# Patient Record
Sex: Female | Born: 1950
Health system: Southern US, Community
[De-identification: ages and names within clinical notes are randomized; demographics above are authoritative.]

## PROBLEM LIST (undated history)

## (undated) DIAGNOSIS — G709 Myoneural disorder, unspecified: Secondary | ICD-10-CM

## (undated) DIAGNOSIS — Z720 Tobacco use: Secondary | ICD-10-CM

## (undated) DIAGNOSIS — F411 Generalized anxiety disorder: Secondary | ICD-10-CM

## (undated) DIAGNOSIS — I350 Nonrheumatic aortic (valve) stenosis: Secondary | ICD-10-CM

## (undated) DIAGNOSIS — K449 Diaphragmatic hernia without obstruction or gangrene: Secondary | ICD-10-CM

## (undated) DIAGNOSIS — E785 Hyperlipidemia, unspecified: Secondary | ICD-10-CM

## (undated) DIAGNOSIS — F419 Anxiety disorder, unspecified: Secondary | ICD-10-CM

## (undated) DIAGNOSIS — Z7982 Long term (current) use of aspirin: Secondary | ICD-10-CM

## (undated) DIAGNOSIS — E079 Disorder of thyroid, unspecified: Secondary | ICD-10-CM

## (undated) DIAGNOSIS — E119 Type 2 diabetes mellitus without complications: Secondary | ICD-10-CM

## (undated) DIAGNOSIS — K219 Gastro-esophageal reflux disease without esophagitis: Secondary | ICD-10-CM

## (undated) DIAGNOSIS — R002 Palpitations: Secondary | ICD-10-CM

## (undated) DIAGNOSIS — F32A Depression, unspecified: Secondary | ICD-10-CM

## (undated) DIAGNOSIS — I5189 Other ill-defined heart diseases: Secondary | ICD-10-CM

## (undated) DIAGNOSIS — I10 Essential (primary) hypertension: Secondary | ICD-10-CM

## (undated) DIAGNOSIS — I700 Atherosclerosis of aorta: Secondary | ICD-10-CM

## (undated) DIAGNOSIS — M810 Age-related osteoporosis without current pathological fracture: Secondary | ICD-10-CM

## (undated) DIAGNOSIS — T148XXA Other injury of unspecified body region, initial encounter: Secondary | ICD-10-CM

## (undated) DIAGNOSIS — G4730 Sleep apnea, unspecified: Secondary | ICD-10-CM

## (undated) DIAGNOSIS — M170 Bilateral primary osteoarthritis of knee: Secondary | ICD-10-CM

## (undated) DIAGNOSIS — G4700 Insomnia, unspecified: Secondary | ICD-10-CM

## (undated) DIAGNOSIS — Z9981 Dependence on supplemental oxygen: Secondary | ICD-10-CM

## (undated) DIAGNOSIS — J449 Chronic obstructive pulmonary disease, unspecified: Secondary | ICD-10-CM

## (undated) DIAGNOSIS — N182 Chronic kidney disease, stage 2 (mild): Secondary | ICD-10-CM

## (undated) DIAGNOSIS — F329 Major depressive disorder, single episode, unspecified: Secondary | ICD-10-CM

## (undated) DIAGNOSIS — Z91030 Bee allergy status: Secondary | ICD-10-CM

## (undated) DIAGNOSIS — I4510 Unspecified right bundle-branch block: Secondary | ICD-10-CM

## (undated) DIAGNOSIS — R011 Cardiac murmur, unspecified: Secondary | ICD-10-CM

## (undated) DIAGNOSIS — G3184 Mild cognitive impairment, so stated: Secondary | ICD-10-CM

## (undated) HISTORY — DX: Depression, unspecified: F32.A

## (undated) HISTORY — PX: TONSILLECTOMY: SUR1361

## (undated) HISTORY — DX: Diaphragmatic hernia without obstruction or gangrene: K44.9

## (undated) HISTORY — DX: Disorder of thyroid, unspecified: E07.9

## (undated) HISTORY — DX: Bilateral primary osteoarthritis of knee: M17.0

## (undated) HISTORY — PX: BACK SURGERY: SHX140

## (undated) HISTORY — DX: Anxiety disorder, unspecified: F41.9

## (undated) HISTORY — DX: Generalized anxiety disorder: F41.1

## (undated) HISTORY — PX: ESOPHAGEAL DILATION: SHX303

## (undated) HISTORY — PX: COLONOSCOPY: SHX174

## (undated) HISTORY — DX: Tobacco use: Z72.0

## (undated) HISTORY — DX: Type 2 diabetes mellitus without complications: E11.9

## (undated) HISTORY — PX: SALIVARY GLAND SURGERY: SHX768

## (undated) HISTORY — DX: Age-related osteoporosis without current pathological fracture: M81.0

## (undated) HISTORY — DX: Bee allergy status: Z91.030

## (undated) HISTORY — DX: Hyperlipidemia, unspecified: E78.5

## (undated) HISTORY — DX: Gastro-esophageal reflux disease without esophagitis: K21.9

## (undated) HISTORY — DX: Major depressive disorder, single episode, unspecified: F32.9

## (undated) HISTORY — DX: Palpitations: R00.2

## (undated) HISTORY — PX: OTHER SURGICAL HISTORY: SHX169

## (undated) HISTORY — PX: OOPHORECTOMY: SHX86

## (undated) HISTORY — PX: CHOLECYSTECTOMY: SHX55

## (undated) HISTORY — DX: Cardiac murmur, unspecified: R01.1

## (undated) HISTORY — DX: Nonrheumatic aortic (valve) stenosis: I35.0

## (undated) HISTORY — DX: Essential (primary) hypertension: I10

## (undated) HISTORY — DX: Chronic obstructive pulmonary disease, unspecified: J44.9

## (undated) HISTORY — PX: APPENDECTOMY: SHX54

## (undated) HISTORY — PX: DILATION AND CURETTAGE OF UTERUS: SHX78

---

## 1970-12-07 HISTORY — PX: OVARIAN CYST REMOVAL: SHX89

## 1991-12-08 HISTORY — PX: ABDOMINAL HYSTERECTOMY: SHX81

## 1995-12-08 DIAGNOSIS — K219 Gastro-esophageal reflux disease without esophagitis: Secondary | ICD-10-CM

## 1995-12-08 HISTORY — DX: Diaphragmatic hernia without obstruction or gangrene: K21.9

## 1999-07-02 ENCOUNTER — Encounter: Payer: Self-pay | Admitting: Family Medicine

## 1999-07-02 LAB — CONVERTED CEMR LAB: Blood Glucose, Fasting: 118 mg/dL

## 1999-07-07 ENCOUNTER — Encounter: Payer: Self-pay | Admitting: Family Medicine

## 1999-07-07 LAB — CONVERTED CEMR LAB: Blood Glucose, Fasting: 118 mg/dL

## 1999-07-08 ENCOUNTER — Encounter: Payer: Self-pay | Admitting: Family Medicine

## 1999-07-08 LAB — CONVERTED CEMR LAB
Hgb A1c MFr Bld: 5.1 %
RBC count: 4.01 10*6/uL
TSH: 0.75 microintl units/mL
WBC, blood: 8.8 10*3/uL

## 2001-01-12 ENCOUNTER — Encounter: Payer: Self-pay | Admitting: Family Medicine

## 2001-01-12 LAB — CONVERTED CEMR LAB
Blood Glucose, Fasting: 147 mg/dL
TSH: 1.34 microintl units/mL

## 2001-01-21 ENCOUNTER — Encounter: Payer: Self-pay | Admitting: Family Medicine

## 2001-01-21 LAB — CONVERTED CEMR LAB
Blood Glucose, Fasting: 118 mg/dL
Hgb A1c MFr Bld: 6.3 %

## 2001-02-09 LAB — FECAL OCCULT BLOOD, GUAIAC: Fecal Occult Blood: NEGATIVE

## 2001-07-26 ENCOUNTER — Encounter: Payer: Self-pay | Admitting: Family Medicine

## 2001-07-26 LAB — CONVERTED CEMR LAB: Hgb A1c MFr Bld: 5.6 %

## 2004-07-23 ENCOUNTER — Encounter: Payer: Self-pay | Admitting: Family Medicine

## 2004-07-23 LAB — CONVERTED CEMR LAB
Blood Glucose, Fasting: 115 mg/dL
Hgb A1c MFr Bld: 5.8 %

## 2005-04-22 ENCOUNTER — Ambulatory Visit: Payer: Self-pay | Admitting: Family Medicine

## 2005-12-07 HISTORY — PX: BREAST BIOPSY: SHX20

## 2006-06-15 ENCOUNTER — Ambulatory Visit: Payer: Self-pay | Admitting: Family Medicine

## 2006-11-23 ENCOUNTER — Ambulatory Visit: Payer: Self-pay | Admitting: Family Medicine

## 2006-11-23 LAB — CONVERTED CEMR LAB
Blood Glucose, Fasting: 136 mg/dL
Hgb A1c MFr Bld: 6 %

## 2006-11-24 ENCOUNTER — Other Ambulatory Visit: Payer: Self-pay

## 2006-11-24 ENCOUNTER — Emergency Department: Payer: Self-pay | Admitting: Emergency Medicine

## 2006-11-25 ENCOUNTER — Ambulatory Visit: Payer: Self-pay | Admitting: General Practice

## 2007-08-10 ENCOUNTER — Ambulatory Visit: Payer: Self-pay | Admitting: Family Medicine

## 2007-08-10 DIAGNOSIS — E119 Type 2 diabetes mellitus without complications: Secondary | ICD-10-CM | POA: Insufficient documentation

## 2007-08-10 DIAGNOSIS — E1143 Type 2 diabetes mellitus with diabetic autonomic (poly)neuropathy: Secondary | ICD-10-CM

## 2007-08-10 DIAGNOSIS — Z87891 Personal history of nicotine dependence: Secondary | ICD-10-CM | POA: Insufficient documentation

## 2007-08-10 HISTORY — DX: Type 2 diabetes mellitus with diabetic autonomic (poly)neuropathy: E11.43

## 2007-12-06 ENCOUNTER — Encounter: Payer: Self-pay | Admitting: Family Medicine

## 2007-12-06 ENCOUNTER — Ambulatory Visit: Payer: Self-pay | Admitting: Family Medicine

## 2007-12-06 LAB — CONVERTED CEMR LAB
ALT: 14 units/L (ref 0–35)
AST: 17 units/L (ref 0–37)
BUN: 4 mg/dL — ABNORMAL LOW (ref 6–23)
Basophils Absolute: 0 10*3/uL (ref 0.0–0.1)
Basophils Relative: 0.5 % (ref 0.0–1.0)
CO2: 31 meq/L (ref 19–32)
Calcium: 9.1 mg/dL (ref 8.4–10.5)
Chloride: 106 meq/L (ref 96–112)
Cholesterol: 303 mg/dL (ref 0–200)
Creatinine, Ser: 0.7 mg/dL (ref 0.4–1.2)
Creatinine,U: 81 mg/dL
Direct LDL: 235.5 mg/dL
Eosinophils Absolute: 0.3 10*3/uL (ref 0.0–0.6)
Eosinophils Relative: 4.4 % (ref 0.0–5.0)
GFR calc Af Amer: 111 mL/min
GFR calc non Af Amer: 92 mL/min
Glucose, Bld: 113 mg/dL — ABNORMAL HIGH (ref 70–99)
HCT: 37.9 % (ref 36.0–46.0)
HDL: 43.1 mg/dL (ref >39.0)
Hemoglobin: 12.9 g/dL (ref 12.0–15.0)
Hgb A1c MFr Bld: 5.8 % (ref 4.6–6.0)
Lymphocytes Relative: 28.2 % (ref 12.0–46.0)
MCHC: 34.1 g/dL (ref 30.0–36.0)
MCV: 102 fL — ABNORMAL HIGH (ref 78.0–100.0)
Microalb Creat Ratio: 6.2 mg/g (ref 0.0–30.0)
Microalb, Ur: 0.5 mg/dL (ref 0.0–1.9)
Monocytes Absolute: 0.4 10*3/uL (ref 0.2–0.7)
Monocytes Relative: 6.5 % (ref 3.0–11.0)
Neutro Abs: 3.8 10*3/uL (ref 1.4–7.7)
Neutrophils Relative %: 60.4 % (ref 43.0–77.0)
Platelets: 278 10*3/uL (ref 150–400)
Potassium: 3.9 meq/L (ref 3.5–5.1)
RBC: 3.72 M/uL — ABNORMAL LOW (ref 3.87–5.11)
RDW: 11.2 % — ABNORMAL LOW (ref 11.5–14.6)
Sodium: 141 meq/L (ref 135–145)
TSH: 1.04 microintl units/mL (ref 0.35–5.50)
Total CHOL/HDL Ratio: 7
Triglycerides: 88 mg/dL (ref 0–149)
VLDL: 18 mg/dL (ref 0–40)
WBC: 6.3 10*3/uL (ref 4.5–10.5)

## 2007-12-07 DIAGNOSIS — F3341 Major depressive disorder, recurrent, in partial remission: Secondary | ICD-10-CM | POA: Insufficient documentation

## 2007-12-07 DIAGNOSIS — E7800 Pure hypercholesterolemia, unspecified: Secondary | ICD-10-CM | POA: Insufficient documentation

## 2007-12-13 ENCOUNTER — Encounter: Payer: Self-pay | Admitting: Family Medicine

## 2007-12-13 ENCOUNTER — Other Ambulatory Visit: Admission: RE | Admit: 2007-12-13 | Discharge: 2007-12-13 | Payer: Self-pay | Admitting: Family Medicine

## 2007-12-13 ENCOUNTER — Ambulatory Visit: Payer: Self-pay | Admitting: Family Medicine

## 2007-12-13 LAB — CONVERTED CEMR LAB: Pap Smear: NORMAL

## 2007-12-14 ENCOUNTER — Telehealth: Payer: Self-pay | Admitting: Family Medicine

## 2007-12-16 ENCOUNTER — Encounter (INDEPENDENT_AMBULATORY_CARE_PROVIDER_SITE_OTHER): Payer: Self-pay | Admitting: *Deleted

## 2008-03-13 ENCOUNTER — Encounter (INDEPENDENT_AMBULATORY_CARE_PROVIDER_SITE_OTHER): Payer: Self-pay | Admitting: *Deleted

## 2008-08-28 ENCOUNTER — Telehealth: Payer: Self-pay | Admitting: Family Medicine

## 2008-09-03 ENCOUNTER — Telehealth: Payer: Self-pay | Admitting: Family Medicine

## 2008-11-06 HISTORY — PX: WRIST FRACTURE SURGERY: SHX121

## 2009-02-25 ENCOUNTER — Telehealth: Payer: Self-pay | Admitting: Family Medicine

## 2009-05-01 ENCOUNTER — Telehealth: Payer: Self-pay | Admitting: Family Medicine

## 2009-05-03 ENCOUNTER — Telehealth: Payer: Self-pay | Admitting: Family Medicine

## 2009-05-27 ENCOUNTER — Ambulatory Visit: Payer: Self-pay | Admitting: Family Medicine

## 2009-05-28 ENCOUNTER — Ambulatory Visit: Payer: Self-pay | Admitting: Family Medicine

## 2009-05-29 DIAGNOSIS — E559 Vitamin D deficiency, unspecified: Secondary | ICD-10-CM | POA: Insufficient documentation

## 2009-05-29 LAB — CONVERTED CEMR LAB: Vit D, 25-Hydroxy: 21 ng/mL — ABNORMAL LOW (ref 30–89)

## 2009-06-07 LAB — CONVERTED CEMR LAB
ALT: 13 units/L (ref 0–35)
AST: 18 units/L (ref 0–37)
Albumin: 3.9 g/dL (ref 3.5–5.2)
Alkaline Phosphatase: 35 units/L — ABNORMAL LOW (ref 39–117)
BUN: 12 mg/dL (ref 6–23)
Basophils Absolute: 0 10*3/uL (ref 0.0–0.1)
Basophils Relative: 0.1 % (ref 0.0–3.0)
Bilirubin, Direct: 0 mg/dL (ref 0.0–0.3)
CO2: 28 meq/L (ref 19–32)
Calcium: 9.1 mg/dL (ref 8.4–10.5)
Chloride: 105 meq/L (ref 96–112)
Cholesterol: 303 mg/dL — ABNORMAL HIGH (ref 0–200)
Creatinine, Ser: 0.8 mg/dL (ref 0.4–1.2)
Creatinine,U: 109.9 mg/dL
Direct LDL: 209.2 mg/dL
Eosinophils Absolute: 0.2 10*3/uL (ref 0.0–0.7)
Eosinophils Relative: 3.2 % (ref 0.0–5.0)
GFR calc non Af Amer: 78.02 mL/min (ref >60)
Glucose, Bld: 114 mg/dL — ABNORMAL HIGH (ref 70–99)
HCT: 38 % (ref 36.0–46.0)
HDL: 36.1 mg/dL — ABNORMAL LOW (ref >39.00)
Hemoglobin: 13.6 g/dL (ref 12.0–15.0)
Hgb A1c MFr Bld: 5.9 % (ref 4.6–6.5)
Lymphocytes Relative: 30.4 % (ref 12.0–46.0)
Lymphs Abs: 1.9 10*3/uL (ref 0.7–4.0)
MCHC: 35.7 g/dL (ref 30.0–36.0)
MCV: 98.8 fL (ref 78.0–100.0)
Microalb Creat Ratio: 12.7 mg/g (ref 0.0–30.0)
Microalb, Ur: 1.4 mg/dL (ref 0.0–1.9)
Monocytes Absolute: 0.5 10*3/uL (ref 0.1–1.0)
Monocytes Relative: 7.8 % (ref 3.0–12.0)
Neutro Abs: 3.8 10*3/uL (ref 1.4–7.7)
Neutrophils Relative %: 58.5 % (ref 43.0–77.0)
Platelets: 240 10*3/uL (ref 150.0–400.0)
Potassium: 4.3 meq/L (ref 3.5–5.1)
RBC: 3.85 M/uL — ABNORMAL LOW (ref 3.87–5.11)
RDW: 11.1 % — ABNORMAL LOW (ref 11.5–14.6)
Sodium: 142 meq/L (ref 135–145)
TSH: 1.69 microintl units/mL (ref 0.35–5.50)
Total Bilirubin: 0.9 mg/dL (ref 0.3–1.2)
Total CHOL/HDL Ratio: 8
Total Protein: 6.7 g/dL (ref 6.0–8.3)
Triglycerides: 262 mg/dL — ABNORMAL HIGH (ref 0.0–149.0)
VLDL: 52.4 mg/dL — ABNORMAL HIGH (ref 0.0–40.0)
WBC: 6.4 10*3/uL (ref 4.5–10.5)

## 2009-09-02 ENCOUNTER — Telehealth: Payer: Self-pay | Admitting: Family Medicine

## 2010-01-28 ENCOUNTER — Telehealth: Payer: Self-pay | Admitting: Family Medicine

## 2010-02-19 ENCOUNTER — Telehealth: Payer: Self-pay | Admitting: Family Medicine

## 2010-03-03 ENCOUNTER — Telehealth: Payer: Self-pay | Admitting: Family Medicine

## 2010-04-03 ENCOUNTER — Ambulatory Visit: Payer: Self-pay | Admitting: Family Medicine

## 2010-04-03 LAB — CONVERTED CEMR LAB
ALT: 20 units/L (ref 0–35)
AST: 19 units/L (ref 0–37)
Cholesterol: 172 mg/dL (ref 0–200)
HDL: 41.5 mg/dL (ref >39.00)
LDL Cholesterol: 110 mg/dL — ABNORMAL HIGH (ref 0–99)
Total CHOL/HDL Ratio: 4
Triglycerides: 104 mg/dL (ref 0.0–149.0)
VLDL: 20.8 mg/dL (ref 0.0–40.0)

## 2010-05-06 ENCOUNTER — Telehealth (INDEPENDENT_AMBULATORY_CARE_PROVIDER_SITE_OTHER): Payer: Self-pay | Admitting: *Deleted

## 2010-05-09 ENCOUNTER — Telehealth (INDEPENDENT_AMBULATORY_CARE_PROVIDER_SITE_OTHER): Payer: Self-pay | Admitting: *Deleted

## 2010-05-27 ENCOUNTER — Ambulatory Visit: Payer: Self-pay | Admitting: Family Medicine

## 2010-07-15 ENCOUNTER — Encounter (INDEPENDENT_AMBULATORY_CARE_PROVIDER_SITE_OTHER): Payer: Self-pay | Admitting: *Deleted

## 2010-08-18 ENCOUNTER — Telehealth (INDEPENDENT_AMBULATORY_CARE_PROVIDER_SITE_OTHER): Payer: Self-pay | Admitting: *Deleted

## 2010-09-08 ENCOUNTER — Telehealth: Payer: Self-pay | Admitting: Family Medicine

## 2010-09-22 ENCOUNTER — Ambulatory Visit: Payer: Self-pay | Admitting: Family Medicine

## 2010-12-03 ENCOUNTER — Telehealth (INDEPENDENT_AMBULATORY_CARE_PROVIDER_SITE_OTHER): Payer: Self-pay | Admitting: *Deleted

## 2010-12-09 ENCOUNTER — Telehealth: Payer: Self-pay | Admitting: Family Medicine

## 2010-12-12 ENCOUNTER — Other Ambulatory Visit: Payer: Self-pay | Admitting: Family Medicine

## 2010-12-12 ENCOUNTER — Ambulatory Visit
Admission: RE | Admit: 2010-12-12 | Discharge: 2010-12-12 | Payer: Self-pay | Source: Home / Self Care | Attending: Family Medicine | Admitting: Family Medicine

## 2010-12-12 ENCOUNTER — Telehealth (INDEPENDENT_AMBULATORY_CARE_PROVIDER_SITE_OTHER): Payer: Self-pay | Admitting: *Deleted

## 2010-12-12 ENCOUNTER — Encounter: Payer: Self-pay | Admitting: Family Medicine

## 2010-12-12 LAB — BASIC METABOLIC PANEL
BUN: 8 mg/dL (ref 6–23)
CO2: 29 mEq/L (ref 19–32)
Calcium: 9.5 mg/dL (ref 8.4–10.5)
Chloride: 104 mEq/L (ref 96–112)
Creatinine, Ser: 0.8 mg/dL (ref 0.4–1.2)
GFR: 82.35 mL/min (ref >60.00)
Glucose, Bld: 117 mg/dL — ABNORMAL HIGH (ref 70–99)
Potassium: 4.5 mEq/L (ref 3.5–5.1)
Sodium: 140 mEq/L (ref 135–145)

## 2010-12-12 LAB — LIPID PANEL
Cholesterol: 186 mg/dL (ref 0–200)
HDL: 44.5 mg/dL (ref >39.00)
LDL Cholesterol: 123 mg/dL — ABNORMAL HIGH (ref 0–99)
Total CHOL/HDL Ratio: 4
Triglycerides: 92 mg/dL (ref 0.0–149.0)
VLDL: 18.4 mg/dL (ref 0.0–40.0)

## 2010-12-12 LAB — HEPATIC FUNCTION PANEL
ALT: 16 U/L (ref 0–35)
AST: 21 U/L (ref 0–37)
Albumin: 3.8 g/dL (ref 3.5–5.2)
Alkaline Phosphatase: 43 U/L (ref 39–117)
Bilirubin, Direct: 0.1 mg/dL (ref 0.0–0.3)
Total Bilirubin: 0.8 mg/dL (ref 0.3–1.2)
Total Protein: 6.4 g/dL (ref 6.0–8.3)

## 2010-12-12 LAB — MICROALBUMIN / CREATININE URINE RATIO
Creatinine,U: 16.4 mg/dL
Microalb Creat Ratio: 1.8 mg/g (ref 0.0–30.0)
Microalb, Ur: 0.3 mg/dL (ref 0.0–1.9)

## 2010-12-12 LAB — HEMOGLOBIN A1C: Hgb A1c MFr Bld: 6 % (ref 4.6–6.5)

## 2010-12-12 LAB — TSH: TSH: 0.79 u[IU]/mL (ref 0.35–5.50)

## 2010-12-16 LAB — CONVERTED CEMR LAB: Vit D, 25-Hydroxy: 40 ng/mL (ref 30–89)

## 2010-12-18 ENCOUNTER — Ambulatory Visit
Admission: RE | Admit: 2010-12-18 | Discharge: 2010-12-18 | Payer: Self-pay | Source: Home / Self Care | Attending: Family Medicine | Admitting: Family Medicine

## 2010-12-18 LAB — HM DIABETES FOOT EXAM

## 2011-01-06 NOTE — Letter (Signed)
Summary: [place] No Show Letter  [place] at [facility]  [address] [place], [place] [address]   Phone: [phone]  Fax: [phone]    03/13/2008   Dear Ms. Alvidrez,   Our records indicate that you missed your scheduled appointment with _DR.SCHALLER____________________ on _04/06/2008 AT 10 AM___________.  Please contact this office to reschedule your appointment as soon as possible.  It is important that you keep your scheduled appointments with your physician, so we can provide you the best care possible.  Please be advised that there may be a charge for "no show" appointments.    Sincerely,   [place] at [facility]

## 2011-01-06 NOTE — Progress Notes (Signed)
Summary: lipitor received from [employer] Note Other Incoming Call back at [employer] [phone]   Summary of Call: Lipitor received from [employer], lot # [id number] exp 09/2012.  Pt to pick up.  Lab appt and follow up appt made for pt. Initial call taken by: Lowella Petties CMA,  February 19, 2010 2:56 PM

## 2011-01-06 NOTE — Progress Notes (Signed)
Summary: REFIILL FOR PROZAC  Phone Note Refill Request Message from:  [employer] on August 28, 2008 11:03 AM  Refills Requested: Medication #1:  PROZAC 40 MG  CAPS 1 qd   Last Refilled: 07/29/2008 [facility] [address] [phone]  Initial call taken by: Providence Crosby,  August 28, 2008 11:04 AM      Prescriptions: PROZAC 40 MG  CAPS (FLUOXETINE HCL) 1 qd  #30 x 12   Entered and Authorized by:   Shaune Leeks MD   Signed by:   Shaune Leeks MD on 08/28/2008   Method used:   Electronically to        [facility] (retail)       [address]       [place], [place]  [address]       Ph: [phone]       Fax: [phone]   RxID:   [id number]  Shaune Leeks MD  August 28, 2008 1:05 PM

## 2011-01-06 NOTE — Progress Notes (Signed)
Summary: pt requests pt assistance for nexium  Phone Note Call from Patient Call back at Home Phone [phone]   Caller: Patient Call For: Shaune Leeks MD Summary of Call: Pt is asking that we send in the paper work for pt assistance for nexium.  She said she really does need this.  Uses otc's for reflux,  when she can afford them.  Form doesnt need your signature but I do need a script to send in. Initial call taken by: Lowella Petties CMA,  February 19, 2010 2:59 PM  Follow-up for Phone Call        Just for reference and chart review, see last phone note.  Script written. Follow-up by: Shaune Leeks MD,  February 19, 2010 3:03 PM  Additional Follow-up for Phone Call Additional follow up Details #1::        Forms mailed to [employer]. Additional Follow-up by: Lowella Petties CMA,  February 20, 2010 9:15 AM    New/Updated Medications: NEXIUM 40 MG CPDR (ESOMEPRAZOLE MAGNESIUM) one tab by mouth 45 mins prior to brfst. Prescriptions: NEXIUM 40 MG CPDR (ESOMEPRAZOLE MAGNESIUM) one tab by mouth 45 mins prior to brfst.  #30 x 12   Entered and Authorized by:   Shaune Leeks MD   Signed by:   Shaune Leeks MD on 02/19/2010   Method used:   Printed then faxed to ...       [facility]       [address]       [place], [place]  [address]       Ph: [phone]       Fax: [phone]   RxID:   [id number]

## 2011-01-06 NOTE — Progress Notes (Signed)
  Medications Added PRAVACHOL 40 MG  TABS (PRAVASTATIN SODIUM) two tabs by mouth at night       Phone Note Call from Patient   Caller: Patient Call For: DR. SCHALLER Summary of Call: STATES SHE CHECKED INTO THE CHOLESTEROL MEDICATIONS AND PRAVASTATIN 40 MG 2 TABLETS AT BEDTIME WAS THE CHEAPIST WANTS THAT CALLED IN PHONE NUMBER [phone] Initial call taken by: Providence Crosby,  December 14, 2007 3:17 PM  Follow-up for Phone Call        will need SGOT,SGPT in 6 weeks, SGOT,SGPT, CHOL PROF 3mos and see me in few days after. Follow-up by: Shaune Leeks MD,  December 14, 2007 3:50 PM  Additional Follow-up for Phone Call Additional follow up Details #1::        PATIENT NOTIFIED AND RX [facility]ED [facility] [address] [phone] Additional Follow-up by: Providence Crosby,  December 14, 2007 4:27 PM    New/Updated Medications: PRAVACHOL 40 MG  TABS (PRAVASTATIN SODIUM) two tabs by mouth at night   Prescriptions: PRAVACHOL 40 MG  TABS (PRAVASTATIN SODIUM) two tabs by mouth at night  #60 x 12   Entered and Authorized by:   Shaune Leeks MD   Signed by:   Shaune Leeks MD on 12/14/2007   Method used:   Print then Give to Patient   RxID:   [id number]

## 2011-01-06 NOTE — Progress Notes (Signed)
Summary: lipitor received from [employer] Note Outgoing Call   Call placed to: Patient Summary of Call: Lipitor received from [employer], lot [id number], exp 11/2012.  Pt to pick up. Initial call taken by: Lowella Petties CMA,  May 09, 2010 11:52 AM

## 2011-01-06 NOTE — Progress Notes (Signed)
Summary: REFILL AMITRIPTYLINE  Phone Note Refill Request Message from:  [employer] on May 01, 2009 1:51 PM  Refills Requested: Medication #1:  AMITRIPTYLINE HCL 50 MG  TABS 1 qhs   Last Ref[facility]: [facility]  [phone]  Initial call taken by: Providence Crosby,  May 01, 2009 1:51 PM  Follow-up for Phone Call        Pt needs to be sen and is aware of the one year requirement to be seen. We have been through this before. Follow-up by: Shaune Leeks MD,  May 01, 2009 2:06 PM  Additional Follow-up for Phone Call Additional follow up Details #1::        PHARMACY NOTIFIED UNABLE TO GET IN [facility] WITH PATIENT  PHONE DISCONNECTED Additional Follow-up by: Providence Crosby,  May 01, 2009 2:10 PM

## 2011-01-06 NOTE — Letter (Signed)
Summary: Nadara Eaton letter  [place] at [facility]  [address] [place], [place] [address]   Phone: [phone]  Fax: [phone]       07/15/2010 MRN: [id number]  [facility] [address] [place], [place]  [address]  Dear Ms. Kornegay,  [facility] [place], and [place] announce the retirement of Arta Silence, M.D., from full-time practice at the [facility] office effective June 05, 2010 and his plans of returning part-time.  It is important to Dr. Hetty Ely and to our practice that you understand that [facility] has seven physicians in our office for your health care needs.  We will continue to offer the same exceptional care that you have today.    Dr. Hetty Ely has spoken to many of you about his plans for retirement and returning part-time in the fall.   We will continue to work with you through the transition to schedule appointments for you in the office and meet the high standards that [place] is committed to.   Again, it is with great pleasure that we share the news that Dr. Hetty Ely will return to [facility] in October of 2011 with a reduced schedule.    If you have any questions, or would like to request an appointment with one of our physicians, please call us at [phone] and press the option for Scheduling an appointment.  We take pleasure in providing you with excellent patient care and look forward to seeing you at your next office visit.  Our [facility] Physicians are:  Tillman Abide, M.D. Laurita Quint, M.D. Roxy Manns, M.D. Kerby Nora, M.D. Hannah Beat, M.D. Ruthe Mannan, M.D. We proudly welcomed Raechel Ache, M.D. and Eustaquio Boyden, M.D. to the practice in July/August 2011.  Sincerely,  [place] [facility]

## 2011-01-06 NOTE — Assessment & Plan Note (Signed)
Summary: COUGH/CLE   Vital Signs:  Patient profile:   61 year old female Weight:      202.25 pounds O2 Sat:      97 % on Room air Temp:     98.3 degrees F oral Pulse rate:   84 / minute Pulse rhythm:   regular BP sitting:   120 / 70  (left arm) Cuff size:   large  Vitals Entered By: Sydell Axon LPN (September 22, 2010 3:01 PM)  O2 Flow:  Room air CC: Sinus drainage, head and chest congestion X 3 weeks, hoarseness and dry cough   History of Present Illness: Sx for 3 weeks.  Head congestion, voice changes, ears plugged up.  Gradual increase in cough.  Was using mucinex and pseudophed for 2 weeks with some temp relief.  Hoarse since Friday, minimally better today than yesterday.  H/o 2-3 dozen tick bites this summer (end of July).  She did have a rash on the abdominal wall that didn't itch.  "Little spots that came up."  No h/o bullseye rash.  Resolving now.  No high fevers recently.  Still coughing with post nasal gtt.  Not much sputum from chest per patient.  Prev was coughing enough to make ribs hurt.    Allergies: 1)  ! Valium  Review of Systems       See HPI.  Otherwise negative.    Physical Exam  General:  GEN: nad, alert and oriented HEENT: mucous membranes moist, TM w/o erythema, nasal epithelium injected, OP with cobblestoning, no exudates on OP, L max sinus tender to percussion  NECK: supple w/o LA CV: rrr. PULM: ctab, no inc wob ABD: soft, +bs EXT: no edema  voice hoarse skin with blanching, minimally erythematous <<1cm papules on ant lower abdominal wall.  no ulceration.  they appear to be resolving.  no excoriation. Not confluent.    Impression & Recommendations:  Problem # 1:  DYSPHONIA (WUX-324.40) I think this is likely due to post nasal gtt and cough.  Will treat for L max sinusitis.  I don't think this is overall related to tick disease.  I don't think the rash is related to tick bite.  She may have had a viral exanthem that is resolving.  Given her exam, I  would treat for presumed bacterial superinfection.  Nontoxic and okay for outpatient follow up.   Complete Medication List: 1)  Amitriptyline Hcl 50 Mg Tabs (Amitriptyline hcl) .... Take one by mouth at bedtime 2)  Prozac 40 Mg Caps (Fluoxetine hcl) .... Take one by mouth daily 3)  Fish Oil Oil (Fish oil) .... One daily 4)  Aspirin 81 Mg Tabs (Aspirin) .... One daily 5)  Aleve 220 Mg Tabs (Naproxen sodium) .... Two daily 6)  Ginkgo Biloba Extr (Ginkgo biloba) .... Otc 120 mg daily 7)  Lipitor 40 Mg Tabs (Atorvastatin calcium) .... One tab by mouth at night. 8)  Nexium 40 Mg Cpdr (Esomeprazole magnesium) .... One tab by mouth 45 mins prior to brfst. 9)  Flexeril 10 Mg Tabs (Cyclobenzaprine hcl) .... One tab by mouth three times a day as needed muscle spasm 10)  Mucinex 600 Mg Xr12h-tab (Guaifenesin) .... As needed 11)  Hydrocodone-homatropine 5-1.5 Mg/28ml Syrp (Hydrocodone-homatropine) .... 5ml by mouth every 6 hours as needed for cough 12)  Doxycycline Hyclate 100 Mg Caps (Doxycycline hyclate) .Marland Kitchen.. 1 by mouth two times a day x10d  Patient Instructions: 1)  Take the antibiotics two times a day and use the cough  syrup every six hours as needed.  The cough medicine may make you drowsy.  This should get better gradually.  Rest your voice and drink plenty of fluids.  Take care.  Prescriptions: DOXYCYCLINE HYCLATE 100 MG CAPS (DOXYCYCLINE HYCLATE) 1 by mouth two times a day x10d  #20 x 0   Entered and Authorized by:   Crawford Givens MD   Signed by:   Crawford Givens MD on 09/22/2010   Method used:   Print then Give to Patient   RxID:   [id number] HYDROCODONE-HOMATROPINE 5-1.5 MG/5ML SYRP (HYDROCODONE-HOMATROPINE) 5ml by mouth every 6 hours as needed for cough  #8oz x 0   Entered and Authorized by:   Crawford Givens MD   Signed by:   Crawford Givens MD on 09/22/2010   Method used:   Print then Give to Patient   RxID:   [id number]    Orders Added: 1)  Est. Patient Level III  [23557]    Current Allergies (reviewed today): ! VALIUM

## 2011-01-06 NOTE — Assessment & Plan Note (Signed)
Summary: 3 MONTH FOLLOW UP   Vital Signs:  Patient profile:   60 year old female Weight:      204.25 pounds BMI:     32.84 Temp:     98.7 degrees F oral Pulse rate:   88 / minute Pulse rhythm:   regular BP sitting:   124 / 74  (left arm) Cuff size:   large  Vitals Entered By: Sydell Axon LPN (May 27, 2010 8:04 AM) CC: 3 Month follow-up   History of Present Illness: The pt is here for "three month followup" not having been here in over a year. Comes in for 15 min  appt yearly despite being told she needs physical and is on indigent list for meds, requiring multiple forms to be filled out routinely. She was again told to get na physical. She is looking today for a prescription for muscle relaxewrs for her back...in LS area centrally and upper back. She also has pain in her legs like a bad toothache at night.  She also c/o tingly feeling down her left arm and relates she is under lots of stress (has said this multiplre times in 5 mins)..All  of these things are related to being worse since the stress has statred. Her husband has been seeing lots of doctors lately. Her mother needs a valve replacement and her lungs need adjusting before she can have surgery, her sister recently diagnosed with breast cancer and the mother needs to be moved due to circumstances where she lives.  She had facial congestion a few weeks ago which moved to the chest, but she has improved. .   Problems Prior to Update: 1)  Unspecified Vitamin D Deficiency  (ICD-268.9) 2)  Sprain&strain Lateral Collateral Ligament Knee Bilat  (ICD-844.0) 3)  Fracture, Right Leg w/ Neuropathy  (ICD-827.0) 4)  Gerd Hh Gastritis  (ICD-530.81) 5)  Pure Hypercholesterolemia  (ICD-272.0) 6)  Depressive Disorder, Rcr, Severe  (ICD-296.33) 7)  Aodm  (ICD-250.00) 8)  Back Pain, Chronic (SURG 3/99)  (ICD-724.5) 9)  Hx, Personal, Tobacco Use  (ICD-V15.82)  Medications Prior to Update: 1)  Amitriptyline Hcl 50 Mg  Tabs (Amitriptyline Hcl)  .Marland Kitchen.. 1 Qhs 2)  Prozac 40 Mg  Caps (Fluoxetine Hcl) .... Take One By Mouth Daily 3)  Etodolac 500 Mg Tabs (Etodolac) .... One Tablet Twice A Day As Needed 4)  Fish Oil   Oil (Fish Oil) .... One Daily 5)  Aspirin 81 Mg  Tabs (Aspirin) .... One Daily 6)  Vitamin C 500 Mg  Tabs (Ascorbic Acid) .... Patient Not Completely Sure of Mg. One Daily 7)  [employer] .... Otc As Directed 8)  Aleve 220 Mg Tabs (Naproxen Sodium) .... Two Daily 9)  Ginkgo Biloba   Extr (Ginkgo Biloba) .... Otc 120 Mg Daily 10)  Lipitor 40 Mg Tabs (Atorvastatin Calcium) .... One Tab By Mouth At Night. 11)  Nexium 40 Mg Cpdr (Esomeprazole Magnesium) .... One Tab By Mouth 45 Mins Prior To Brfst.  Allergies: 1)  ! Valium  Physical Exam  General:  Well-developed,well-nourished,in no acute distress; alert,appropriate and cooperative throughout examination Head:  Normocephalic and atraumatic without obvious abnormalities. No apparent alopecia or balding. Sinuses NT. Eyes:  Conjunctiva clear bilaterally.  Ears:  External ear exam shows no significant lesions or deformities.  Otoscopic examination reveals clear canals, tympanic membranes are intact bilaterally without bulging, retraction, inflammation or discharge. Hearing is grossly normal bilaterally. Nose:  External nasal examination shows no deformity or inflammation. Nasal mucosa are pink  and moist without lesions or exudates. Mouth:  Oral mucosa and oropharynx without lesions or exudates.  Teeth in good repair. Neck:  No deformities, masses, or tenderness noted. Chest Wall:  No deformities, masses, or tenderness noted. Lungs:  Normal respiratory effort, chest expands symmetrically. Lungs are clear to auscultation, no crackles or wheezes. Heart:  Normal rate and regular rhythm. S1 and S2 normal without gallop, murmur, click, rub or other extra sounds. Abdomen:  Bowel sounds positive,abdomen soft and non-tender without masses, organomegaly or hernias noted.   Impression &  Recommendations:  Problem # 1:  GERD HH GASTRITIS (ICD-530.81)  Discussed avoiding NSAIDs and again went over GERD prophylaxis. She has been taking Aleve so discussed regular Tyl instead. Also discussed back prophylaxis. Her updated medication list for this problem includes:    Nexium 40 Mg Cpdr (Esomeprazole magnesium) ..... One tab by mouth 45 mins prior to brfst.  Diagnostics Reviewed:  Discussed lifestyle modifications, diet, antacids/medications, and preventive measures. Handout provided.   Problem # 2:  PURE HYPERCHOLESTEROLEMIA (ICD-272.0) Assessment: Unchanged Discussed labs and need for avoiding faty foods as LDL still slightly high for being diabetic. Her updated medication list for this problem includes:    Lipitor 40 Mg Tabs (Atorvastatin calcium) ..... One tab by mouth at night.  Labs Reviewed: SGOT: 19 (04/03/2010)   SGPT: 20 (04/03/2010)   HDL:41.50 (04/03/2010), 36.10 (05/28/2009)  LDL:110 (04/03/2010), DEL (12/06/2007)  Chol:172 (04/03/2010), 303 (05/28/2009)  Trig:104.0 (04/03/2010), 262.0 (05/28/2009)  Problem # 3:  DEPRESSIVE DISORDER, RCR, SEVERE (ICD-296.33) Assessment: Unchanged IS stable and doing reasonably well. With all stress going on, has been amazingly resilient.  Problem # 4:  BACK PAIN, CHRONIC (SURG 3/99) (ICD-724.5) Assessment: Unchanged Discussed exercises. She is leaving tomm to move her mother. At great risk of strain. Discussed heat and ice and exercises to do. The following medications were removed from the medication list:    Etodolac 500 Mg Tabs (Etodolac) ..... One tablet twice a day as needed Her updated medication list for this problem includes:    Aspirin 81 Mg Tabs (Aspirin) ..... One daily    Aleve 220 Mg Tabs (Naproxen sodium) .Marland Kitchen..Marland Kitchen Two daily    Flexeril 10 Mg Tabs (Cyclobenzaprine hcl) ..... One tab by mouth three times a day as needed muscle spasm  Problem # 5:  AODM (ICD-250.00) Assessment: Unchanged Seems to have reasonable  lifestyle and diet for this historically. Encouraged. Really needs PE to assess this properly. Her updated medication list for this problem includes:    Aspirin 81 Mg Tabs (Aspirin) ..... One daily  Labs Reviewed: Creat: 0.8 (05/28/2009)    Reviewed HgBA1c results: 5.9 (05/28/2009)  5.8 (12/06/2007)  Complete Medication List: 1)  Amitriptyline Hcl 50 Mg Tabs (Amitriptyline hcl) .... Take one by mouth at bedtime 2)  Prozac 40 Mg Caps (Fluoxetine hcl) .... Take one by mouth daily 3)  Fish Oil Oil (Fish oil) .... One daily 4)  Aspirin 81 Mg Tabs (Aspirin) .... One daily 5)  Vitamin C 500 Mg Tabs (Ascorbic acid) .... Patient not completely sure of mg. one daily 6)  Aleve 220 Mg Tabs (Naproxen sodium) .... Two daily 7)  Ginkgo Biloba Extr (Ginkgo biloba) .... Otc 120 mg daily 8)  Lipitor 40 Mg Tabs (Atorvastatin calcium) .... One tab by mouth at night. 9)  Nexium 40 Mg Cpdr (Esomeprazole magnesium) .... One tab by mouth 45 mins prior to brfst. 10)  Flexeril 10 Mg Tabs (Cyclobenzaprine hcl) .... One tab by mouth three times  a day as needed muscle spasm  Patient Instructions: 1)  Please get scheduled for PE when able. Prescriptions: FLEXERIL 10 MG TABS (CYCLOBENZAPRINE HCL) one tab by mouth three times a day as needed muscle spasm  #50 x 2   Entered and Authorized by:   Shaune Leeks MD   Signed by:   Shaune Leeks MD on 05/27/2010   Method used:   Electronically to        [facility]       [address]       [place], [place]  [address]       Ph: [phone]       Fax: [phone]   RxID:   [id number] AMITRIPTYLINE HCL 50 MG  TABS (AMITRIPTYLINE HCL) Take one by mouth at bedtime  #30 x 5   Entered and Authorized by:   Shaune Leeks MD   Signed by:   Shaune Leeks MD on 05/27/2010   Method used:   Electronically to        [facility]       [address]        [place], [place]  [address]       Ph: [phone]       Fax: [phone]   RxID:   [id number]   Current Allergies (reviewed today): ! VALIUM

## 2011-01-06 NOTE — Progress Notes (Signed)
Summary: RX Prozac  Phone Note Refill Request Call back at [phone] Message from:  [facility] on September 02, 2009 9:14 AM  Refills Requested: Medication #1:  PROZAC 40 MG  CAPS Take one by mouth daily   Last Refilled: 08/09/2009 Received e-scribe refill request.   Method Requested: Electronic Initial call taken by: Sydell Axon LPN,  September 02, 2009 9:15 AM  Follow-up for Phone Call        Rx called to pharmacy  This was authorized by Dr. Hetty Ely. Follow-up by: Sydell Axon LPN,  September 02, 2009 11:29 AM    New/Updated Medications: PROZAC 40 MG  CAPS (FLUOXETINE HCL) Take one by mouth daily Prescriptions: PROZAC 40 MG  CAPS (FLUOXETINE HCL) Take one by mouth daily  #30 x 5   Entered by:   Shaune Leeks MD   Authorized by:   Sydell Axon LPN   Signed by:   Shaune Leeks MD on 09/02/2009   Method used:   Telephoned to ...       [facility]       [address]       [place], [place]  [address]       Ph: [phone]       Fax: [phone]   RxID:   [id number]

## 2011-01-06 NOTE — Progress Notes (Signed)
Summary: order for lipitor called to [facility]  Phone Note Outgoing Call   Summary of Call: Order for lipitor called to [facility], order number [id number]. Initial call taken by: Lowella Petties CMA,  August 18, 2010 8:25 AM     Appended Document: order for lipitor called to [facility] Lipitor received from [facility], lot number [id number], exp 03/2013

## 2011-01-06 NOTE — Assessment & Plan Note (Signed)
Summary: MED REFILLS/HEA    Vital Signs:  Patient Profile:   60 Years Old Female Weight:      205 pounds Temp:     98.5 degrees F oral Pulse rate:   96 / minute Pulse rhythm:   regular BP sitting:   140 / 70  (left arm) Cuff size:   large  Vitals Entered By: Providence Crosby (August 10, 2007 10:42 AM)                 Chief Complaint:  REFILL ELAVIL.  History of Present Illness: Broke wrist 12/19 and required surgery. Then had to drive spouse around and couldn't get in to be seen. As a result, hasn't been here for some time...was to have physical exam about the time of the wrist fracture.  She basically feels well.  Her husband has had a heart attack and is now on Medicaid (they both avoided coming in to be seen due to no insurance altho they both had steady jobs).  She thinks she has PAD, but is still smoking and now knows that she needs to quit. Still taking generic Prozac and Amytriptiline, without difficulty.   Current Allergies (reviewed today): ! VALIUM      Physical Exam  General:     Well-developed,well-nourished,in no acute distress; alert,appropriate and cooperative throughout examination Head:     Normocephalic and atraumatic without obvious abnormalities. No apparent alopecia or balding. Eyes:     Conjunctiva clear bilaterally.  Ears:     External ear exam shows no significant lesions or deformities.  Otoscopic examination reveals clear canals, tympanic membranes are intact bilaterally without bulging, retraction, inflammation or discharge. Hearing is grossly normal bilaterally. Nose:     External nasal examination shows no deformity or inflammation. Nasal mucosa are pink and moist without lesions or exudates. Mouth:     Oral mucosa and oropharynx without lesions or exudates.  Teeth in good repair. Neck:     No deformities, masses, or tenderness noted. Chest Wall:     No deformities, masses, or tenderness noted. Lungs:     Normal respiratory effort,  chest expands symmetrically. Lungs are clear to auscultation, no crackles or wheezes. Heart:     Normal rate and regular rhythm. S1 and S2 normal without gallop, murmur, click, rub or other extra sounds. Extremities:     No clubbing, cyanosis, edema, or deformity noted with normal full range of motion of all joints.      Impression & Recommendations:  Problem # 1:  HX, PERSONAL, TOBACCO USE (ICD-V15.82) Assessment: Unchanged Encouraged to quit...frustrating that pt still "has trouble affording medications" but can afford to smoke.  Problem # 2:  BACK PAIN, CHRONIC (ICD-724.5) Assessment: Unchanged Stable.Marland KitchenMarland KitchenElavil allows her to sleep. Given script.  Problem # 3:  AODM (ICD-250.00) Assessment: Unchanged Will have to check in the future, pt does not check nos.  Problem # 4:  DEPRESSIVE DISORDER, RCR, SEVERE (ICD-296.33) Assessment: Unchanged Knows she needs to stay on meds...was hospitalized at [facility] 1 month in the past. Script given.  Complete Medication List: 1)  Amitriptyline Hcl 50 Mg Tabs (Amitriptyline hcl) .Marland Kitchen.. 1 qhs 2)  Prozac 40 Mg Caps (Fluoxetine hcl) .Marland Kitchen.. 1 qd   Patient Instructions: 1)  Avoid Salt. 2)  RTC for PE.    Prescriptions: PROZAC 40 MG  CAPS (FLUOXETINE HCL) 1 qd  #30 x 12   Entered and Authorized by:   Shaune Leeks MD   Signed by:   Franne Grip  Mykaila Blunck MD on 08/10/2007   Method used:   Print then Give to Patient   RxID:   [id number] AMITRIPTYLINE HCL 50 MG  TABS (AMITRIPTYLINE HCL) 1 qhs  #30 x 12   Entered and Authorized by:   Shaune Leeks MD   Signed by:   Shaune Leeks MD on 08/10/2007   Method used:   Print then Give to Patient   RxID:   [id number]

## 2011-01-06 NOTE — Assessment & Plan Note (Signed)
Summary: CPX    Vital Signs:  Patient Profile:   60 Years Old Female Height:     67 inches (175.26 cm) Weight:      203 pounds Temp:     98.8 degrees F oral Pulse rate:   92 / minute Pulse rhythm:   regular BP sitting:   134 / 70  (left arm) Cuff size:   large  Vitals Entered By: Providence Crosby (December 13, 2007 2:17 PM)                 Chief Complaint:  check up hemocult cards to patient.  History of Present Illness: Some right knee pain.  Stress due to concern over husband's depression.  Current Allergies (reviewed today): ! VALIUM   Family History:    Father: DECEASED AT AGE 1 GANGRENE OF ABD. 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  Eyes      Complains of blurring.      Denies discharge, double vision, eye irritation, eye pain, halos, itching, light sensitivity, red eye, vision loss-1 eye, and vision loss-both eyes.      occasional  blurry vision  ENT      Complains of difficulty swallowing and sore throat.      Denies decreased hearing, ear discharge, earache, hoarseness, nasal congestion, nosebleeds, postnasal drainage, ringing in ears, and sinus pressure.      esophageal spasms associated with GERD  CV      Denies bluish discoloration of lips or nails, chest pain or discomfort, difficulty breathing at night, difficulty breathing while lying down, fainting, fatigue, leg cramps with exertion, lightheadness, near fainting, palpitations, shortness of breath with exertion, swelling of feet, swelling of hands, and weight gain.  Resp      Complains of cough and shortness of breath.      Denies chest discomfort, chest pain with inspiration, coughing up blood, excessive snoring, hypersomnolence, morning headaches, pleuritic, sputum productive, and wheezing.      SOB and coughing associated with excessive smoking   GI      Denies abdominal pain, bloody stools, change in bowel habits, constipation, dark tarry stools, diarrhea, excessive appetite, gas, hemorrhoids, indigestion, loss  of appetite, nausea, vomiting, vomiting blood, and yellowish skin color.  GU      Complains of discharge.      Denies abnormal vaginal bleeding, decreased libido, dysuria, genital sores, hematuria, incontinence, nocturia, urinary frequency, and urinary hesitancy.      discharge noted after hysterectomy  MS      Complains of joint pain and muscle aches.      Denies joint redness, joint swelling, loss of strength, low back pain, mid back pain, muscle , cramps, muscle weakness, stiffness, and thoracic pain.      all associated with arrthritis  Derm      Complains of dryness.      Denies changes in color of skin, changes in nail beds, excessive perspiration, flushing, hair loss, insect bite(s), itching, lesion(s), poor wound healing, and rash.  Neuro      Denies brief paralysis, difficulty with concentration, disturbances in coordination, falling  down, headaches, inability to speak, memory loss, numbness, poor balance, seizures, sensation of room spinning, tingling, tremors, visual disturbances, and weakness.  Psych      Complains of depression.      Denies alternate hallucination ( auditory/visual), anxiety, easily angered, easily tearful, irritability, mental problems, panic attacks, sense of great danger, suicidal thoughts/plans, thoughts of violence, unusual visions or sounds, and thoughts /plans of harming others.  Endo      Denies cold intolerance, excessive hunger, excessive thirst, excessive urination, heat intolerance, polyuria, and weight change.  Heme      Denies abnormal bruising, bleeding, enlarge lymph nodes, fevers, pallor, and skin discoloration.  Allergy      Denies hives or rash, itching eyes, persistent infections, seasonal allergies, and sneezing.  ENT      Denies decreased hearing, difficulty swallowing, ear discharge, earache, hoarseness, nasal congestion, nosebleeds, postnasal drainage, ringing in ears, sinus pressure, and sore throat.   Physical Exam  General:     Well-developed,well-nourished,in no acute distress; alert,appropriate and cooperative throughout examination Head:     Normocephalic and atraumatic without obvious abnormalities. No apparent alopecia or balding. Eyes:     Conjunctiva clear bilaterally.  Ears:     External ear exam shows no significant lesions or deformities.  Otoscopic examination reveals clear canals, tympanic membranes are intact bilaterally without bulging, retraction, inflammation or discharge. Hearing is grossly normal bilaterally. Nose:     External nasal examination shows no deformity or inflammation. Nasal mucosa are pink and moist without lesions or exudates. Mouth:     Oral mucosa and oropharynx without lesions or exudates.  Teeth in good repair. Neck:     No deformities, masses, or tenderness noted. Chest Wall:     No deformities, masses, or tenderness  noted. Breasts:     No mass, nodules, thickening, tenderness, bulging, retraction, inflamation, nipple discharge or skin changes noted.  Mild fibrocystic areas laterally, L>R. Lungs:     Normal respiratory effort, chest expands symmetrically. Lungs are clear to auscultation, no crackles or wheezes. Heart:     Normal rate and regular rhythm. S1 and S2 normal without gallop, murmur, click, rub or other extra sounds. Abdomen:     Bowel sounds positive,abdomen soft and non-tender without masses, organomegaly or hernias noted. "No pain today." Rectal:     No external abnormalities noted. Normal sphincter tone. No rectal masses or tenderness. G neg. Genitalia:     Bimanual only done Introitus wnl, Uterus and Cervix absent, Adnexa nontender w/o mass, ovaries not felt.  Msk:     No deformity or  scoliosis noted of thoracic or lumbar spine.   Pulses:     R and L carotid,radial,femoral,dorsalis pedis and posterior tibial pulses are full and equal bilaterally Extremities:     No clubbing, cyanosis, edema, or deformity noted with normal full range of motion of all joints.   Neurologic:     No cranial nerve deficits noted. Station and gait are normal. Plantar reflexes are down-going bilaterally. DTRs are symmetrical throughout. Sensory, motor and coordinative functions appear intact. Skin:     Intact without suspicious lesions or rashes Cervical Nodes:     No lymphadenopathy noted Axillary Nodes:     No palpable lymphadenopathy Inguinal Nodes:     No significant adenopathy Psych:     Cognition and judgment appear intact. Alert and cooperative with normal attention span and concentration. No apparent delusions, illusions, hallucinations    Impression & Recommendations:  Problem # 1:  SPRAIN&STRAIN LATERAL COLLATERAL LIGAMENT KNEE BILAT (ICD-844.0) Assessment: New Discussed...avoid lateral stress  Start SLR longterm Tyl as needed.  Problem # 2:  PURE HYPERCHOLESTEROLEMIA  (ICD-272.0) Assessment: Deteriorated Really needs trmt...given choice of Pravachol, Simvastatin, Lipitor in order of preference backwards(prefer Lipitor) Will test appropriately 6 and 12 weeks when starts. Labs Reviewed: Chol: 303 (12/06/2007)   HDL: 43.1 (12/06/2007)   LDL: DEL (12/06/2007)   TG: 88 (12/06/2007) SGOT: 17 (12/06/2007)   SGPT: 14 (12/06/2007)   Problem # 3:  DEPRESSIVE DISORDER, RCR, SEVERE (ICD-296.33) Assessment: Improved Stable.  Problem # 4:  AODM (ICD-250.00) Assessment: Improved Great job, nos practically normal. Mother currently in hosp for Diabetic complications. Labs Reviewed: HgBA1c: 5.8 (12/06/2007)   Creat: 0.7 (12/06/2007)      Problem # 5:  HX, PERSONAL, TOBACCO USE (ICD-V15.82) Assessment: Improved But still smoking some.  Complete Medication List: 1)  Amitriptyline Hcl 50 Mg Tabs (Amitriptyline hcl) .Marland Kitchen.. 1 qhs 2)  Prozac 40 Mg Caps (Fluoxetine hcl) .Marland Kitchen.. 1 qd   Patient Instructions: 1)  RTC to check chol and see how knee is doing.    ]

## 2011-01-06 NOTE — Progress Notes (Signed)
Summary: asking for amitriptyline  Phone Note Call from Patient Call back at [phone]   Caller: Patient Summary of Call: Pt had requested a refill on amitriptyline that was denied because she has not been in in over a year.  She states she has had a death in the family and really needs this.  Can you call in a few until she can get in.  She said she cant come in till after 6/14. Initial call taken by: Lowella Petties,  May 03, 2009 4:33 PM  Follow-up for Phone Call        I am comfortable calling in a few until Dr. Hetty Ely gets back in town - I would not recommend stopping acute in the face of an acute grieving process.  Call in #21, 0 refills. Should follow-up with Dr. Hetty Ely. Follow-up by: Hannah Beat MD,  May 03, 2009 4:47 PM      Appended Document: asking for amitriptyline Med called [facility], advised pt.

## 2011-01-06 NOTE — Progress Notes (Signed)
Summary: Rx Prozac  Phone Note Refill Request Call back at [phone] Message from:  [facility] on March 03, 2010 12:36 PM  Refills Requested: Medication #1:  PROZAC 40 MG  CAPS Take one by mouth daily   Last Refilled: 01/30/2010  Method Requested: Electronic Initial call taken by: Sydell Axon LPN,  March 03, 2010 12:37 PM    Prescriptions: PROZAC 40 MG  CAPS (FLUOXETINE HCL) Take one by mouth daily  #30 x 5   Entered and Authorized by:   Shaune Leeks MD   Signed by:   Shaune Leeks MD on 03/03/2010   Method used:   Electronically to        [facility] ([facility])       [address]       [place], [place]  [address]       Ph: [phone]       Fax: [phone]   RxID:   [id number]

## 2011-01-06 NOTE — Progress Notes (Signed)
Summary: [facility]  Phone Note Refill Request Message from:  [facility] on February 25, 2009 8:33 AM  Refills Requested: Medication #1:  AMITRIPTYLINE HCL 50 MG  TABS 1 qhs   Last Refilled: 01/25/2009 [facility] [phone]  Initial call taken by: Providence Crosby,  February 25, 2009 8:33 AM      Prescriptions: AMITRIPTYLINE HCL 50 MG  TABS (AMITRIPTYLINE HCL) 1 qhs  #30 x 0   Entered and Authorized by:   Shaune Leeks MD   Signed by:   Shaune Leeks MD on 02/25/2009   Method used:   Electronically to        [facility] ([facility])       [address]       [place], [place]  [address]       Ph: [phone]       Fax: [phone]   RxID:   [id number]  Needs to be seen before next refill. Shaune Leeks MD  February 25, 2009 1:19 PM

## 2011-01-06 NOTE — Progress Notes (Signed)
Summary: RX PROZAC  Phone Note Refill Request Message from:  [facility] on September 03, 2008 9:02 AM  Refills Requested: Medication #1:  PROZAC 40 MG  CAPS 1 qd   Last Refilled: 07/29/[phone]  Initial call taken by: Providence Crosby,  September 03, 2008 9:03 AM  Follow-up for Phone Call        This was just done 9/22. Follow-up by: Shaune Leeks MD,  September 03, 2008 9:27 AM  Additional Follow-up for Phone Call Additional follow up Details #1::        CALLED PHARMACY ALREADY DONE 08/28/2008 Additional Follow-up by: Providence Crosby,  September 03, 2008 10:14 AM

## 2011-01-06 NOTE — Progress Notes (Signed)
Summary: Prozac  Phone Note Refill Request Call back at [phone] Message from:  [facility] on September 08, 2010 9:14 AM  Refills Requested: Medication #1:  PROZAC 40 MG  CAPS Take one by mouth daily   Last Refilled: 08/07/2010  Method Requested: Electronic Initial call taken by: Sydell Axon LPN,  September 08, 2010 9:15 AM  Follow-up for Phone Call        sent.  Follow-up by: Crawford Givens MD,  September 08, 2010 8:30 PM    Prescriptions: PROZAC 40 MG  CAPS (FLUOXETINE HCL) Take one by mouth daily  #30 x 5   Entered and Authorized by:   Crawford Givens MD   Signed by:   Crawford Givens MD on 09/08/2010   Method used:   Electronically to        [facility] ([facility])       [address]       [place], [place]  [address]       Ph: [phone]       Fax: [phone]   RxID:   [id number] PROZAC 40 MG  CAPS (FLUOXETINE HCL) Take one by mouth daily  #30 x 5   Entered and Authorized by:   Crawford Givens MD   Signed by:   Crawford Givens MD on 09/08/2010   Method used:   Electronically to        [facility]       [address]       [place], [place]  [address]       Ph: [phone]       Fax: [phone]   RxID:   [id number]

## 2011-01-06 NOTE — Letter (Signed)
Summary: Out of Work  [facility]  [address] [place], [place] [address]   Phone: [phone]  Fax: [phone]    September 22, 2010   Employee:  Amanda [name]    To Whom It May Concern:   For Medical reasons, please excuse the above named employee from work from today until cough resolved.   If you need additional information, please feel free to contact our office.         Sincerely,    Crawford Givens MD

## 2011-01-06 NOTE — Progress Notes (Signed)
Summary: lipitor ordered from [facility] Note Outgoing Call   Summary of Call: Order for lipitor refill called to [facility]. Initial call taken by: Lowella Petties CMA,  May 06, 2010 10:52 AM

## 2011-01-06 NOTE — Progress Notes (Signed)
Summary: RX AMITRIPTYLINE  Phone Note Refill Request Message from:  [facility] on September 03, 2008 9:48 AM  Refills Requested: Medication #1:  AMITRIPTYLINE HCL 50 MG  TABS 1 qhs   Last Refilled: 08/04/[facility]  Initial call taken by: Providence Crosby,  September 03, 2008 9:49 AM      Prescriptions: AMITRIPTYLINE HCL 50 MG  TABS (AMITRIPTYLINE HCL) 1 qhs  #30 x 6   Entered and Authorized by:   Shaune Leeks MD   Signed by:   Shaune Leeks MD on 09/03/2008   Method used:   Telephoned to ...       [facility]       [address]       [place], [place]  [address]       Ph: [phone]       Fax: [phone]   RxID:   [id number]  Shaune Leeks MD  September 03, 2008 10:04 AM

## 2011-01-06 NOTE — Progress Notes (Signed)
Summary: written scripts needed for lipitor and nexium  Phone Note Call from Patient   Caller: Patient Call For: Shaune Leeks MD Summary of Call: Pt wants to see if she can get pt assistance for lipitor and nexium.  I need written scripts to send to companies.  Form for [employer] needs your signature, it is on your shelf. Initial call taken by: Lowella Petties CMA,  January 28, 2010 9:02 AM  Follow-up for Phone Call        She hasn't been on either of these in a long time, almost three years. My opinion is that if she has gone that long w/o Nexium, she doesn't need it. The Lipitor will require blood testing at 6 weeks and three months and I will need to see her then. Follow-up by: Shaune Leeks MD,  January 28, 2010 10:20 AM  Additional Follow-up for Phone Call Additional follow up Details #1::        [facility] advising pt.                Lowella Petties CMA  January 30, 2010 10:26 AM     New/Updated Medications: LIPITOR 40 MG TABS (ATORVASTATIN CALCIUM) one tab by mouth at night. Prescriptions: LIPITOR 40 MG TABS (ATORVASTATIN CALCIUM) one tab by mouth at night.  #90 x 3   Entered and Authorized by:   Shaune Leeks MD   Signed by:   Shaune Leeks MD on 01/28/2010   Method used:   Print then Give to Patient   RxID:   [id number]

## 2011-01-06 NOTE — Assessment & Plan Note (Signed)
Summary: MED REFILL/DLO   Vital Signs:  Patient profile:   60 year old female Height:      66.25 inches Weight:      202 pounds BMI:     32.48 Temp:     98 degrees F oral Pulse rate:   92 / minute Pulse rhythm:   regular BP sitting:   128 / 72  (left arm) Cuff size:   large  Vitals Entered By: Lewanda Rife (May 27, 2009 12:10 PM)  History of Present Illness: Pt is here for medicatioon rfill, not having been seen in over a year. She is having dizziness and blurred vision. She is a diabetic and "has been watching her diet." She has never beenon medications, mainly because she feels she can't afford them. She consistently declines meds due to cost...her husband has had severe heart problems costing significant amts of money. She is also concerned about her chol. Wants to have it checked. Has been on Pravachol in the past which she stopped 2/09 due to aggerqvating her reflux. She does take fish oil. "Without insurance it is hard to do these things."   Problems Prior to Update: 1)  Sprain&strain Lateral Collateral Ligament Knee Bilat  (ICD-844.0) 2)  Fracture, Right Leg w/ Neuropathy  (ICD-827.0) 3)  Gerd Hh Gastritis  (ICD-530.81) 4)  Pure Hypercholesterolemia  (ICD-272.0) 5)  Depressive Disorder, Rcr, Severe  (ICD-296.33) 6)  Aodm  (ICD-250.00) 7)  Back Pain, Chronic (SURG 3/99)  (ICD-724.5) 8)  Hx, Personal, Tobacco Use  (ICD-V15.82)  Medications Prior to Update: 1)  Amitriptyline Hcl 50 Mg  Tabs (Amitriptyline Hcl) .Marland Kitchen.. 1 Qhs 2)  Prozac 40 Mg  Caps (Fluoxetine Hcl) .Marland Kitchen.. 1 Qd 3)  Pravachol 40 Mg  Tabs (Pravastatin Sodium) .... Two Tabs By Mouth At Night  Allergies: 1)  ! Valium  Physical Exam  General:  Well-developed,well-nourished,in no acute distress; alert,appropriate and cooperative throughout examination Head:  Normocephalic and atraumatic without obvious abnormalities. No apparent alopecia or balding. Eyes:  Conjunctiva clear bilaterally.  Ears:  External ear exam  shows no significant lesions or deformities.  Otoscopic examination reveals clear canals, tympanic membranes are intact bilaterally without bulging, retraction, inflammation or discharge. Hearing is grossly normal bilaterally. Nose:  External nasal examination shows no deformity or inflammation. Nasal mucosa are pink and moist without lesions or exudates. Mouth:  Oral mucosa and oropharynx without lesions or exudates.  Teeth in good repair. Neck:  No deformities, masses, or tenderness noted. Chest Wall:  No deformities, masses, or tenderness noted. Lungs:  Normal respiratory effort, chest expands symmetrically. Lungs are clear to auscultation, no crackles or wheezes. Heart:  Normal rate and regular rhythm. S1 and S2 normal without gallop, murmur, click, rub or other extra sounds.   Impression & Recommendations:  Problem # 1:  GERD HH GASTRITIS (ICD-530.81) Assessment Unchanged Has precluded chol trmt in the past.  Problem # 2:  PURE HYPERCHOLESTEROLEMIA (ICD-272.0) Assessment: Unchanged  Will recheck. Get paperwork for [employer] [facility] program her husband has for Lipitor. The following medications were removed from the medication list:    Pravachol 40 Mg Tabs (Pravastatin sodium) .Marland Kitchen..Marland Kitchen Two tabs by mouth at night  Labs Reviewed: SGOT: 17 (12/06/2007)   SGPT: 14 (12/06/2007)   HDL:43.1 (12/06/2007)  LDL:DEL (12/06/2007)  Chol:303 (12/06/2007)  Trig:88 (12/06/2007)  Problem # 3:  DEPRESSIVE DISORDER, RCR, SEVERE (ICD-296.33) Assessment: Unchanged Stable on Amitriptyline and Prozac. Cont.  Problem # 4:  AODM (ICD-250.00) Assessment: Unchanged  On no meds. Has not  needed to be on something for some time but her sxs suggest current lack of control. She  hasn't been in to be tested and hadsn't been willing to follow up due to cost. She is willing to be tested today. Her updated medication list for this problem includes:    Aspirin 81 Mg Tabs (Aspirin) ..... One daily  Labs Reviewed: Creat:  0.7 (12/06/2007)    Reviewed HgBA1c results: 5.8 (12/06/2007)  6.0 (11/23/2006)  Complete Medication List: 1)  Amitriptyline Hcl 50 Mg Tabs (Amitriptyline hcl) .Marland Kitchen.. 1 qhs 2)  Prozac 40 Mg Caps (Fluoxetine hcl) .Marland Kitchen.. 1 qd 3)  Etodolac 500 Mg Tabs (Etodolac) .... One tablet twice a day as needed 4)  Fish Oil Oil (Fish oil) .... One daily 5)  Aspirin 81 Mg Tabs (Aspirin) .... One daily 6)  Vitamin C 500 Mg Tabs (Ascorbic acid) .... Patient not completely sure of mg. one daily 7)  En[employer]eca  .... Otc as directed 8)  Aleve 220 Mg Tabs (Naproxen sodium) .... Two daily 9)  Ginkgo Biloba Extr (Ginkgo biloba) .... Otc 120 mg daily  Other Orders: Venipuncture ([id number])  Patient Instructions: 1)  Pls schedule pt for fasting labs. Prescriptions: AMITRIPTYLINE HCL 50 MG  TABS (AMITRIPTYLINE HCL) 1 qhs  #30 x 12   Entered and Authorized by:   Shaune Leeks MD   Signed by:   Shaune Leeks MD on 05/27/2009   Method used:   Print then Give to Patient   RxID:   [id number]   Current Allergies (reviewed today): ! VALIUM

## 2011-01-06 NOTE — Letter (Signed)
Summary: Results Follow up Letter   at [facility]  [address] [place], [place] [address]   Phone: [phone]  Fax: [phone]    12/16/2007 MRN: [id number]  [facility] [address] [place], [place]  [address]  Dear Amanda [name],  The following are the results of your recent test(s):  Test         Result    Pap Smear:        Normal __x___  Not Normal _____ Comments: YOUR PAP SMEAR REPORT WAS NORMAL. PLEASE MAKE SURE TO REPEAT  IN ONE YEAR. ______________________________________________________ Cholesterol: LDL(Bad cholesterol):         Your goal is less than:         HDL (Good cholesterol):       Your goal is more than: Comments:  ______________________________________________________ Mammogram:        Normal _____  Not Normal _____ Comments:  ___________________________________________________________________ Hemoccult:        Normal _____  Not normal _______ Comments:    _____________________________________________________________________ Other Tests:    We routinely do not discuss normal results over the telephone.  If you desire a copy of the results, or you have any questions about this information we can discuss them at your next office visit.   Sincerely,

## 2011-01-08 NOTE — Progress Notes (Signed)
Summary: Rx Amitriptyline  Phone Note Refill Request Call back at [phone] Message from:  [facility] on December 09, 2010 8:34 AM  Refills Requested: Medication #1:  AMITRIPTYLINE HCL 50 MG  TABS Take one by mouth at bedtime   Last Refilled: 11/06/2010  Method Requested: Electronic Initial call taken by: Sydell Axon LPN,  December 09, 2010 8:36 AM  Follow-up for Phone Call        I again asked her 6 mos ago to get appt for PE. May have 15 pills and no more until she indeed gets a PE or will need to estblish with someone else somewhere else for her care. Follow-up by: Shaune Leeks MD,  December 09, 2010 12:21 PM  Additional Follow-up for Phone Call Additional follow up Details #1::        Left message on voicemail for patient to call back. Sydell Axon LPN  December 09, 2010 2:48 PM  Left another message on voicemail for patient to call back. Sydell Axon LPN  December 10, 2010 11:12 AM  Called rx to pharmacy and requested that they let the patient know that she needs to call the office and schedule a physical. Sydell Axon LPN  December 10, 2010 11:12 AM     Additional Follow-up for Phone Call Additional follow up Details #2::    Advised pt, lab appt and physical appt made.  Pt is coming in tomorrow for labs.  Please order.              Lowella Petties CMA, AAMA  December 11, 2010 5:01 PM  Noted. Labs ordered. Follow-up by: Shaune Leeks MD,  December 11, 2010 5:13 PM  Prescriptions: AMITRIPTYLINE HCL 50 MG  TABS (AMITRIPTYLINE HCL) Take one by mouth at bedtime  #15 x 0   Entered and Authorized by:   Shaune Leeks MD   Signed by:   Shaune Leeks MD on 12/09/2010   Method used:   Electronically to        [facility]       [address]       [place], [place]  [address]       Ph: [phone]       Fax: [phone]   RxID:   [id number]

## 2011-01-08 NOTE — Assessment & Plan Note (Signed)
Summary: COMPLETE PHYSICAL/ 30 mins   Vital Signs:  Patient profile:   60 year old female Weight:      206.25 pounds Temp:     98.0 degrees F oral Pulse rate:   88 / minute Pulse rhythm:   regular BP sitting:   114 / 70  (left arm) Cuff size:   large  Vitals Entered By: Sydell Axon LPN (December 18, 2010 11:25 AM) CC: 30 Minute checkup, has had a hysterectomy   History of Present Illness: Pt here for Comp Exam. Amanda [name] was seen once in 2011 here, for URI in Oct. Amanda [name] has not had a PE since 1/09. Amanda [name] has no insurance and avoids appt if poss. Amanda [name] has a few complaints: Depression bothering her more lately. Amanda [name] c/o being tired a;ll the time...has family situations that cause headache in the posterior neck. Legs and hips ache a lot, wakes her up at night and still has numbness in her toes.  Amanda [name] occas has tingling in arms with stress.  Pt is still smoking. Gets depressed worse with trying to quit. Pt has not had mammo in long time.  Preventive Screening-Counseling & Management  Alcohol-Tobacco     Alcohol drinks/day: <1     Alcohol type: wine, mixed drink     Smoking Status: current     Smoking Cessation Counseling: yes     Packs/Day: 1     Year Started: 1969     Passive Smoke Exposure: yes  Caffeine-Diet-Exercise     Caffeine use/day: 5+     Does Patient Exercise: no  Problems Prior to Update: 1)  Dysphonia  (ZOX-096.04) 2)  Unspecified Vitamin D Deficiency  (ICD-268.9) 3)  Sprain&strain Lateral Collateral Ligament Knee Bilat  (ICD-844.0) 4)  Fracture, Right Leg w/ Neuropathy  (ICD-827.0) 5)  Gerd Hh Gastritis  (ICD-530.81) 6)  Pure Hypercholesterolemia  (ICD-272.0) 7)  Depressive Disorder, Rcr, Severe  (ICD-296.33) 8)  Aodm  (ICD-250.00) 9)  Back Pain, Chronic (SURG 3/99)  (ICD-724.5) 10)  Hx, Personal, Tobacco Use  (ICD-V15.82)  Medications Prior to Update: 1)  Amitriptyline Hcl 50 Mg  Tabs (Amitriptyline Hcl) .... Take One By Mouth At Bedtime 2)  Prozac 40 Mg  Caps  (Fluoxetine Hcl) .... Take One By Mouth Daily 3)  Fish Oil   Oil (Fish Oil) .... One Daily 4)  Aspirin 81 Mg  Tabs (Aspirin) .... One Daily 5)  Aleve 220 Mg Tabs (Naproxen Sodium) .... Two Daily 6)  Ginkgo Biloba   Extr (Ginkgo Biloba) .... Otc 120 Mg Daily 7)  Lipitor 40 Mg Tabs (Atorvastatin Calcium) .... One Tab By Mouth At Night. 8)  Nexium 40 Mg Cpdr (Esomeprazole Magnesium) .... One Tab By Mouth 45 Mins Prior To Brfst. 9)  Flexeril 10 Mg Tabs (Cyclobenzaprine Hcl) .... One Tab By Mouth Three Times A Day As Needed Muscle Spasm 10)  Mucinex 600 Mg Xr12h-Tab (Guaifenesin) .... As Needed 11)  Hydrocodone-Homatropine 5-1.5 Mg/79ml Syrp (Hydrocodone-Homatropine) .... 5ml By Mouth Every 6 Hours As Needed For Cough 12)  Doxycycline Hyclate 100 Mg Caps (Doxycycline Hyclate) .Marland Kitchen.. 1 By Mouth Two Times A Day X10d  Current Medications (verified): 1)  Amitriptyline Hcl 50 Mg  Tabs (Amitriptyline Hcl) .... Take One By Mouth At Bedtime 2)  Prozac 40 Mg  Caps (Fluoxetine Hcl) .... Take One By Mouth Daily 3)  Aspirin 81 Mg  Tabs (Aspirin) .... One Daily 4)  Aleve 220 Mg Tabs (Naproxen Sodium) .... Two Daily 5)  Lipitor 40 Mg Tabs (  Atorvastatin Calcium) .... One Tab By Mouth At Night. 6)  Nexium 40 Mg Cpdr (Esomeprazole Magnesium) .... One Tab By Mouth 45 Mins Prior To Brfst. 7)  Flexeril 10 Mg Tabs (Cyclobenzaprine Hcl) .... One Tab By Mouth Three Times A Day As Needed Muscle Spasm 8)  Mucinex 600 Mg Xr12h-Tab (Guaifenesin) .... As Needed  Allergies: 1)  ! Valium  Past History:  Family History: Last updated: 20-Dec-2010 Father: DECEASED AT AGE 68 GANGRENE OF ABD. PAST APPY Mother: A  53  DM; ELEVATED CHOLESTEROL, Arthritis BKR Back Surg  AVR SISTER  A 58 Hypercholesteremia  Breast Ca CV: NEGATIVE HBP: NEGATIVE DM: + MOTHER GOUT/ARTHRITIS: PROSTATE CANCER:  BREAST CANCER: + PGM OVARIAN/ UTERINE CANCER: NEGATIVE COLON CANCER: DEPRESSION: + SELF  ETOH/DRUG ABUSE: NEGATIVE OTHER: NEGATIVE  STROKE  Social History: Last updated: 12/13/2007 Marital Status: Married LIVES WITH HUSBAND Children: 2 CHILDREN OUT OF HOME, son has hypercholesteremia Occupation: Animator  Risk Factors: Alcohol Use: <1 (20-Dec-2010) Caffeine Use: 5+ (20-Dec-2010) Exercise: no (Dec 20, 2010)  Risk Factors: Smoking Status: current (2010/12/20) Packs/Day: 1 (Dec 20, 2010) Passive Smoke Exposure: yes (12-20-10)  Past Surgical History: TONSILLECTOMY 5 YOA NSVD x 12 HOSP [facility] Depression 1 month OVARIAN CYSTECTOMY, OOPHORECTOMY 1972 SALIVA GLAND EXCISION R ANT NECK DUE TO STONE EGD WITH DILITATION // h.H. ESOPH. RING GASTRITIS// DUODENITITS (DR. ELLIOTT):(01/1996) RIGHT SUB MASD. GLAND EXCISION @ND  DEGREE TO STONE :(DR.VAUGHT):(09/1995) LAP CHOLEY (Dr Crawford):(01/1996) FLEX (DR. ELLIOTT) BRBPR-- NORMAL:(04/1996) HYSTERECTOMY WITH OOPHERECTOMY:(1993) DISABILITY EVALUATION (PSYCH>):(01/01/2004) FRACTURE OF RIGHT LEG X 3 PLACES WITH NERVE DAMAGE:(06/2002) L WRST FRACTURE (11/2008)  Family History: Father: DECEASED AT AGE 68 GANGRENE OF ABD. PAST APPY Mother: A  62  DM; ELEVATED CHOLESTEROL, Arthritis BKR Back Surg  AVR SISTER  A 58 Hypercholesteremia  Breast Ca CV: NEGATIVE HBP: NEGATIVE DM: + MOTHER GOUT/ARTHRITIS: PROSTATE CANCER:  BREAST CANCER: + PGM OVARIAN/ UTERINE CANCER: NEGATIVE COLON CANCER: DEPRESSION: + SELF  ETOH/DRUG ABUSE: NEGATIVE OTHER: NEGATIVE STROKE  Review of Systems General:  Complains of fatigue; denies chills, fever, sweats, weakness, and weight loss; felt due to stress. Eyes:  Complains of blurring; denies discharge, eye pain, and itching; vision changes. ENT:  Complains of decreased hearing; denies earache and ringing in ears. CV:  Denies chest pain or discomfort, fainting, fatigue, palpitations, shortness of breath with exertion, swelling of feet, and swelling of hands; chest twinges with signif smoking when  stressed. Resp:  Complains of wheezing; denies cough; mild . GI:  Complains of constipation; denies abdominal pain, bloody stools, change in bowel habits, dark tarry stools, diarrhea, indigestion, loss of appetite, nausea, vomiting, vomiting blood, and yellowish skin color; off/ on   swallowing probs with stress.. GU:  Denies discharge, dysuria, nocturia, and urinary frequency. MS:  Complains of joint pain and low back pain; denies muscle aches and cramps; legs and hips, chronic LBP. Derm:  Denies dryness, itching, and rash. Neuro:  Complains of poor balance; denies numbness, tingling, and tremors; upon awakening in the AM.  Physical Exam  General:  Well-developed,well-nourished,in no acute distress; alert,appropriate and cooperative throughout examination Head:  Normocephalic and atraumatic without obvious abnormalities. No apparent alopecia or balding. Sinuses NT. Eyes:  Conjunctiva clear bilaterally.  Ears:  External ear exam shows no significant lesions or deformities.  Otoscopic examination reveals clear canals, tympanic membranes are intact bilaterally without bulging, retraction, inflammation or discharge. Hearing is grossly normal bilaterally. Nose:  External nasal examination shows no deformity or inflammation. Nasal mucosa are pink and moist without lesions or  exudates. Mouth:  Oral mucosa and oropharynx without lesions or exudates.  Teeth in good repair. Neck:  No deformities, masses, or tenderness noted. Chest Wall:  No deformities, masses, or tenderness noted. Breasts:  No mass, nodules, thickening, tenderness, bulging, retraction, inflamation, nipple discharge or skin changes noted.   Lungs:  Normal respiratory effort, chest expands symmetrically. Lungs are clear to auscultation, no crackles or wheezes. Heart:  Normal rate and regular rhythm. S1 and S2 normal without gallop, murmur, click, rub or other extra sounds. Abdomen:  Bowel sounds positive,abdomen soft and non-tender  without masses, organomegaly or hernias noted. Rectal:  Not done. Genitalia:  Not done. Msk:  No deformity or scoliosis noted of thoracic or lumbar spine.   Pulses:  R and L carotid,radial,femoral,dorsalis pedis and posterior tibial pulses are full and equal bilaterally Extremities:  No clubbing, cyanosis, edema, or deformity noted with normal full range of motion of all joints.   Neurologic:  No cranial nerve deficits noted. Station and gait are normal.  Sensory, motor and coordinative functions appear intact. Skin:  Intact without suspicious lesions or rashes Cervical Nodes:  No lymphadenopathy noted Inguinal Nodes:  No significant adenopathy Psych:  Cognition and judgment appear intact. Alert and cooperative with normal attention span and concentration. No apparent delusions, illusions, hallucinations  Diabetes Management Exam:    Foot Exam (with socks and/or shoes not present):       Sensory-Monofilament:          Left foot: diminished          Right foot: diminished       Inspection:          Left foot: normal          Right foot: normal   Impression & Recommendations:  Problem # 1:  HX, PERSONAL, TOBACCO USE (ICD-V15.82) Assessment Unchanged The single most important problem Amanda [name] has and must face. With the financial problems they have as well as the medical problems Amanda [name] has and are at risk of, stopping smoking will give her the biggest change in well being and medical care. Amanda [name] has many excuses not to be able to quit. We discussed them all.  Problem # 2:  GERD HH GASTRITIS (ICD-530.81) Assessment: Unchanged Lifestyle and activity are main needs to be changed. Discussed. Her updated medication list for this problem includes:    Nexium 40 Mg Cpdr (Esomeprazole magnesium) ..... One tab by mouth 45 mins prior to brfst.  Diagnostics Reviewed:  Discussed lifestyle modifications, diet, antacids/medications, and preventive measures. Handout provided.   Problem # 3:  PURE  HYPERCHOLESTEROLEMIA (ICD-272.0) Assessment: Unchanged LDL should be lower. May poss increase her Lipitor dose in the future if no impr on her own. Her updated medication list for this problem includes:    Lipitor 40 Mg Tabs (Atorvastatin calcium) ..... One tab by mouth at night.  Labs Reviewed: SGOT: 21 (12/12/2010)   SGPT: 16 (12/12/2010)   HDL:44.50 (12/12/2010), 41.50 (04/03/2010)  LDL:123 (12/12/2010), 110 (04/03/2010)  Chol:186 (12/12/2010), 172 (04/03/2010)  Trig:92.0 (12/12/2010), 104.0 (04/03/2010)  Problem # 4:  DEPRESSIVE DISORDER, RCR, SEVERE (ICD-296.33) Assessment: Deteriorated Will increase her Prozac. Call back if sxs worsen or has side effects.  Problem # 5:  AODM (ICD-250.00) Assessment: Improved Great control. Cont. Her updated medication list for this problem includes:    Aspirin 81 Mg Tabs (Aspirin) ..... One daily  Labs Reviewed: Creat: 0.8 (12/12/2010)    Reviewed HgBA1c results: 6.0 (12/12/2010)  5.9 (05/28/2009)  Problem # 6:  UNSPECIFIED VITAMIN D DEFICIENCY (ICD-268.9) Assessment: Improved Therapeutic on curr repl therapy. Cont.  Problem # 7:  BACK PAIN, CHRONIC (SURG 3/99) (ICD-724.5) Assessment: Unchanged Chronic. Would possibly improve with tobacco cessation. Her updated medication list for this problem includes:    Aspirin 81 Mg Tabs (Aspirin) ..... One daily    Aleve 220 Mg Tabs (Naproxen sodium) .Marland Kitchen..Marland Kitchen Two daily    Flexeril 10 Mg Tabs (Cyclobenzaprine hcl) ..... One tab by mouth three times a day as needed muscle spasm  Complete Medication List: 1)  Amitriptyline Hcl 50 Mg Tabs (Amitriptyline hcl) .... Take one by mouth at bedtime 2)  Prozac 40 Mg Caps (Fluoxetine hcl) .... Take two  by mouth daily 3)  Aspirin 81 Mg Tabs (Aspirin) .... One daily 4)  Aleve 220 Mg Tabs (Naproxen sodium) .... Two daily 5)  Lipitor 40 Mg Tabs (Atorvastatin calcium) .... One tab by mouth at night. 6)  Nexium 40 Mg Cpdr (Esomeprazole magnesium) .... One tab by  mouth 45 mins prior to brfst. 7)  Flexeril 10 Mg Tabs (Cyclobenzaprine hcl) .... One tab by mouth three times a day as needed muscle spasm 8)  Mucinex 600 Mg Xr12h-tab (Guaifenesin) .... As needed  Patient Instructions: 1)  Take Guaifenesin by [facility] or [facility] and getting MUCOUS RELIEF EXPECTORANT (400mg ), take 11/2 tabs by mouth AM and NOON. 2)  Drink lots of fluids anytime taking Guaifenesin.  3)  [facility]. 4)  Needs eye exam. Prescriptions: NEXIUM 40 MG CPDR (ESOMEPRAZOLE MAGNESIUM) one tab by mouth 45 mins prior to brfst.  #30 x 12   Entered and Authorized by:   Shaune Leeks MD   Signed by:   Shaune Leeks MD on 12/18/2010   Method used:   Print then Give to Patient   RxID:   [id number] LIPITOR 40 MG TABS (ATORVASTATIN CALCIUM) one tab by mouth at night.  #90 x 3   Entered and Authorized by:   Shaune Leeks MD   Signed by:   Shaune Leeks MD on 12/18/2010   Method used:   Print then Give to Patient   RxID:   [id number] PROZAC 40 MG  CAPS (FLUOXETINE HCL) Take two  by mouth daily  #60 x 12   Entered and Authorized by:   Shaune Leeks MD   Signed by:   Shaune Leeks MD on 12/18/2010   Method used:   Electronically to        [facility]       [address]       [place], [place]  [address]       Ph: [phone]       Fax: [phone]   RxID:   [id number] AMITRIPTYLINE HCL 50 MG  TABS (AMITRIPTYLINE HCL) Take one by mouth at bedtime  #30 x 12   Entered and Authorized by:   Shaune Leeks MD   Signed by:   Shaune Leeks MD on 12/18/2010   Method used:   Electronically to        [facility]       [address]       [place], [place]  [address]       Ph: [phone]       Fax: [phone]   RxID:   [id number]  Orders Added: 1)   Est. Patient Level III [956[address]]    Current Allergies (reviewed today): ! VALIUM

## 2011-01-08 NOTE — Progress Notes (Signed)
Summary: lipitor ordered from [employer] [facility] Note Call from Patient   Caller: Patient Summary of Call: Lipitor ordered from [facility], order number [id number]. Initial call taken by: Lowella Petties CMA, AAMA,  December 03, 2010 4:22 PM

## 2011-01-08 NOTE — Progress Notes (Signed)
Summary: lipitor received from [employer] [facility] Note Other Incoming   Summary of Call: Lipitor received from [employer]. Lot number [id number], exp 08/2013.  Given to pt. Initial call taken by: Lowella Petties CMA, AAMA,  December 12, 2010 8:11 AM

## 2011-01-27 ENCOUNTER — Telehealth: Payer: Self-pay | Admitting: Family Medicine

## 2011-02-03 ENCOUNTER — Telehealth: Payer: Self-pay | Admitting: Family Medicine

## 2011-02-03 NOTE — Progress Notes (Signed)
Summary: samples of nexium given  Phone Note Call from Patient   Caller: Patient Summary of Call: Pt requests samples of nexium, # 4 boxes given- lot number [id number], expires 07/2013.              Lowella Petties CMA, AAMA  January 27, 2011 8:48 AM   Follow-up for Phone Call        Noted. Follow-up by: Shaune Leeks MD,  January 27, 2011 11:32 AM

## 2011-02-12 NOTE — Progress Notes (Signed)
Summary: new script needed for nexium  Phone Note Call from Patient   Caller: Patient Call For: Shaune Leeks MD Summary of Call: New script for nexium is needed to go along with pt assistance forms.  Script is on  your desk for signature.              Lowella Petties CMA, AAMA  February 03, 2011 5:11 PM Signed.  Initial call taken by: Shaune Leeks MD,  February 04, 2011 7:20 AM  Follow-up for Phone Call        Script and application for pt assistance faxed and mailed to [facility].                Lowella Petties CMA, AAMA  February 05, 2011 9:20 AM     Prescriptions: NEXIUM 40 MG CPDR (ESOMEPRAZOLE MAGNESIUM) one tab by mouth 45 mins prior to brfst.  #90 x 3   Entered by:   Lowella Petties CMA, AAMA   Authorized by:   Shaune Leeks MD   Signed by:   Lowella Petties CMA, AAMA on 02/03/2011   Method used:   Print then Give to Patient   RxID:   [id number]

## 2011-02-18 ENCOUNTER — Telehealth: Payer: Self-pay | Admitting: Family Medicine

## 2011-02-24 NOTE — Progress Notes (Signed)
Summary: form for pt assistance for lipitor  Phone Note Call from Patient   Summary of Call: Form for pt assistance for lipitor is on your desk, for [employer].  Please sign.  She got a script for lipitor at her physical visit, but it's so difficult to get in touch with her, I dont know whether she still has it.                Lowella Petties CMA, AAMA  February 18, 2011 2:42 PM   Follow-up for Phone Call        Signed. Shaune Leeks MD  February 18, 2011 5:33 PM   Script is in your box for signature.          Lowella Petties CMA, AAMA  February 19, 2011 8:19 AM Sorry, didn't see that a script was needed. Follow-up by: Shaune Leeks MD,  February 19, 2011 8:35 AM  Additional Follow-up for Phone Call Additional follow up Details #1::        Forms mailed to [facility].             Lowella Petties CMA, AAMA  February 19, 2011 9:13 AM     Prescriptions: LIPITOR 40 MG TABS (ATORVASTATIN CALCIUM) one tab by mouth at night.  #90 x 3   Entered by:   Lowella Petties CMA, AAMA   Authorized by:   Shaune Leeks MD   Signed by:   Lowella Petties CMA, AAMA on 02/19/2011   Method used:   Print then Give to Patient   RxID:   [id number]

## 2011-03-06 ENCOUNTER — Other Ambulatory Visit: Payer: Self-pay | Admitting: *Deleted

## 2011-03-06 NOTE — Telephone Encounter (Signed)
Lipitor received from [employer] under pt assistance program.  One bottle of # 90.  Lot number [id number].  Expiration date 11/2013.  Pt advised and will pick up.

## 2011-05-26 ENCOUNTER — Other Ambulatory Visit: Payer: Self-pay | Admitting: *Deleted

## 2011-05-26 NOTE — Telephone Encounter (Signed)
Agreed -

## 2011-05-26 NOTE — Telephone Encounter (Signed)
Reorder of lipitor called to [employer], order number [id number].  Samples of nexium given,  five boxes of #5 each, lot [id number], exp 11/2013.  Dr. Lorenza Chick patient.  Please sign ok'ing samples

## 2011-06-06 ENCOUNTER — Telehealth: Payer: Self-pay | Admitting: *Deleted

## 2011-06-06 NOTE — Telephone Encounter (Signed)
Lipitor received from [employer], message left advising pt.  Lot [id number], exp 02/2014

## 2011-06-17 ENCOUNTER — Telehealth: Payer: Self-pay | Admitting: *Deleted

## 2011-06-17 MED ORDER — ESOMEPRAZOLE MAGNESIUM 40 MG PO CPDR: 40.0000 mg | DELAYED_RELEASE_CAPSULE | Freq: Every day | ORAL | Status: DC

## 2011-06-17 NOTE — Telephone Encounter (Signed)
Pt needs new script for nexium to send to [employer] for pt assistance with nexium.  We had sent paperwork in in February but they were missing something from the patient and she didn't respond to their request for more information.  We will need to send in new paperwork.

## 2011-06-18 NOTE — Telephone Encounter (Signed)
Pt forgot to mention that she is almost out of nexium samples and requested more.  3 sample boxes of 5 each given.  Lot [id number], exp 03/2014.

## 2011-06-18 NOTE — Telephone Encounter (Signed)
Pt to pick up script and application for assistance and will mail them in, placed up front for pick up.

## 2011-09-09 ENCOUNTER — Telehealth: Payer: Self-pay | Admitting: *Deleted

## 2011-09-09 NOTE — Telephone Encounter (Signed)
Correction on lot number, correct lot number is [id number].

## 2011-09-09 NOTE — Telephone Encounter (Signed)
Lipitor received from [employer], one bottle of # 90, lot number [id number].

## 2011-10-27 ENCOUNTER — Ambulatory Visit (INDEPENDENT_AMBULATORY_CARE_PROVIDER_SITE_OTHER): Payer: Self-pay | Admitting: Family Medicine

## 2011-10-27 ENCOUNTER — Encounter: Payer: Self-pay | Admitting: Family Medicine

## 2011-10-27 DIAGNOSIS — J209 Acute bronchitis, unspecified: Secondary | ICD-10-CM

## 2011-10-27 MED ORDER — AZITHROMYCIN 250 MG PO TABS: ORAL_TABLET | ORAL | Status: AC

## 2011-10-27 NOTE — Progress Notes (Signed)
  Subjective:    Patient ID: Amanda [name], female    DOB: 1951-05-07, 60 y.o.   MRN: [id number]  HPI    Review of Systems     Objective:   Physical Exam        Assessment & Plan:

## 2011-10-27 NOTE — Assessment & Plan Note (Signed)
See instructions

## 2011-10-27 NOTE — Progress Notes (Signed)
  Subjective:    Patient ID: Amanda [name], female    DOB: 27-Jan-1951, 60 y.o.   MRN: [id number]  HPI Pt here as acute appt for cough, ST and ear discomfort. She is seen infrequently due to lack of insurance. She denies fever but has some chills. Her sxs started Sat and her husband is having surgery Mon for part of his colon removed. She has had difficulty sleeping for two nights. When she lays down the coughing gets worse. Her rib cage is sore from coughing. She is not productive. She has not had heaqdache more than usual, she has occas ear blockage with difficulty hearing that will pop and improve. Coughing causes congestion and popping. She denies rhinitis but has minimal sinus congestion, nmild ST worse yesterday, and cough as above. She denies N/V, mild loose stools but no overt diarrhea.  She has taken Nyquil last two nights and Dayquil the last two days, then other regular meds.     Review of SystemsNoncontributory except as above.       Objective:   Physical Exam  Constitutional: She appears well-developed and well-nourished. No distress.       Mild congestion and hoarseness.  HENT:  Head: Normocephalic and atraumatic.  Right Ear: External ear normal.  Left Ear: External ear normal.  Nose: Nose normal.  Mouth/Throat: Oropharynx is clear and moist. No oropharyngeal exudate.  Eyes: Conjunctivae and EOM are normal. Pupils are equal, round, and reactive to light.  Neck: Normal range of motion. Neck supple. No thyromegaly present.  Cardiovascular: Normal rate, regular rhythm and normal heart sounds.   Pulmonary/Chest: Effort normal and breath sounds normal. She has no wheezes. She has no rales.       Ronchi in the left lung, both anteriorally and posteriorally via auscultation.  Lymphadenopathy:    She has no cervical adenopathy.  Skin: She is not diaphoretic.          Assessment & Plan:

## 2011-10-27 NOTE — Patient Instructions (Signed)
Take Zithro. Take Guaifenesin (400mg ), take 11/2 tabs by mouth AM and NOON. Get GUAIFENESIN by  [facility] or [facility] and getting MUCOUS RELIEF EXPECTORANT/CONGESTION. DO NOT GET MUCINEX (Timed Release Guaifenesin)  Drink lots of fluids. Take Tyl 500mg  2 tabs three times a day. Keep lozenge in mouth all day. Use routine at night. Use Delsym at night with Nyquil (check for Dextromethorphan in the Nyquil.) Vicks as discussed.

## 2011-10-28 ENCOUNTER — Ambulatory Visit: Payer: Self-pay | Admitting: Family Medicine

## 2011-11-03 ENCOUNTER — Telehealth: Payer: Self-pay | Admitting: *Deleted

## 2011-11-03 MED ORDER — GUAIFENESIN-CODEINE 100-10 MG/5ML PO SYRP: 5.0000 mL | ORAL_SOLUTION | ORAL | Status: DC | PRN

## 2011-11-03 NOTE — Telephone Encounter (Signed)
Pt finished her antibiotic on Saturday but still has a bad cough.  She has been taking delsym, but that's not helping, and she is asking if something stronger can be called to [facility].  She knows it may be tomorrow before this note is read.

## 2011-11-03 NOTE — Telephone Encounter (Signed)
Rx called to pharmacy. Patient notified that medication has been called to the pharmacy.

## 2011-11-09 ENCOUNTER — Telehealth: Payer: Self-pay | Admitting: Internal Medicine

## 2011-11-09 NOTE — Telephone Encounter (Signed)
Patient notified as instructed by telephone. Patient states that the Delsym and lozenges are not helping at all. Patient states that a cough medication was called in last week and that did help, but she finished that last night. 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 Patient states that she needs something stronger for her cough besides the Delsym and lozenges. [facility]

## 2011-11-09 NOTE — Telephone Encounter (Signed)
Patient called and stated that she has taken all of her antibiotic and she is still congested and coughing wanted to know if she needs another antibiotic round and also Rx for her cough.  Please advise.

## 2011-11-09 NOTE — Telephone Encounter (Signed)
She should not need any further antibiotic. Cont the instructions as she was given The Guaifenesin is for the congestion, the Delsym and tachnique with the lozenges is for the cough. If develops documented fever above 101.5 or brown sputum, let me know.

## 2011-11-10 MED ORDER — GUAIFENESIN-CODEINE 100-10 MG/5ML PO SYRP: 5.0000 mL | ORAL_SOLUTION | ORAL | Status: DC | PRN

## 2011-11-10 NOTE — Telephone Encounter (Signed)
Patient notified that rx has been called to pharmacy for her cough.

## 2011-11-27 ENCOUNTER — Other Ambulatory Visit: Payer: Self-pay | Admitting: Family Medicine

## 2011-12-02 ENCOUNTER — Other Ambulatory Visit: Payer: Self-pay | Admitting: Internal Medicine

## 2011-12-02 NOTE — Telephone Encounter (Signed)
Called [facility] to order her medication.  Order no# is [id number].  Will call patient when med gets here.

## 2011-12-10 ENCOUNTER — Telehealth: Payer: Self-pay | Admitting: *Deleted

## 2011-12-10 NOTE — Telephone Encounter (Signed)
Left message for patient to call back. Lipitor 40 mg has arrived from [employer] [facility] and is ready for patient to pick it up.

## 2011-12-14 NOTE — Telephone Encounter (Signed)
Patient picked medication up

## 2011-12-28 ENCOUNTER — Other Ambulatory Visit: Payer: Self-pay | Admitting: Family Medicine

## 2011-12-28 NOTE — Telephone Encounter (Signed)
Patient does not have an appt to establish care, please advise.

## 2011-12-29 NOTE — Telephone Encounter (Signed)
LMOVM to schedule appt this Spring.

## 2011-12-29 NOTE — Telephone Encounter (Signed)
Have her schedule with MD this spring.  Thanks.

## 2012-01-07 ENCOUNTER — Other Ambulatory Visit: Payer: Self-pay | Admitting: Family Medicine

## 2012-01-07 NOTE — Telephone Encounter (Signed)
Sent!

## 2012-02-11 ENCOUNTER — Ambulatory Visit (INDEPENDENT_AMBULATORY_CARE_PROVIDER_SITE_OTHER): Payer: Self-pay | Admitting: Family Medicine

## 2012-02-11 ENCOUNTER — Encounter: Payer: Self-pay | Admitting: Family Medicine

## 2012-02-11 VITALS — BP 120/76 | HR 100 | Temp 98.0°F | Ht 69.0 in | Wt 211.1 lb

## 2012-02-11 DIAGNOSIS — F419 Anxiety disorder, unspecified: Secondary | ICD-10-CM

## 2012-02-11 DIAGNOSIS — Z136 Encounter for screening for cardiovascular disorders: Secondary | ICD-10-CM

## 2012-02-11 DIAGNOSIS — F332 Major depressive disorder, recurrent severe without psychotic features: Secondary | ICD-10-CM

## 2012-02-11 DIAGNOSIS — F411 Generalized anxiety disorder: Secondary | ICD-10-CM

## 2012-02-11 MED ORDER — CLONAZEPAM 0.5 MG PO TABS: 0.5000 mg | ORAL_TABLET | Freq: Two times a day (BID) | ORAL | Status: DC | PRN

## 2012-02-11 NOTE — Progress Notes (Signed)
  Patient Name: Amanda [name] Date of Birth: Apr 02, 1951 Age: 61 y.o. Medical Record Number: [id number] Gender: female Date of Encounter: 02/11/2012  History of Present Illness:  Amanda [name] is a 61 y.o. very pleasant female patient who presents with the following:  Twinges in chest and some in the head Will get some tightness down her chest and sometimes a spiderweb feeling down her arms like an egg going down arms  Usually when really tight in her chest. Thinks that some of it may be tension.  Sub teacher with the school teacher Extremely stressed out now at home Financial and pressure regarding her husband's care  Feels like in a vice right now and having ahard time with the pressure. Last Friday felt awful and took a nap.   Was in the inpatient ward for about three weeks a number of years ago.  Increased her antidepressant -- and that helped some.    Past Medical History, Surgical History, Social History, Family History, Problem List, Medications, and Allergies have been reviewed and updated if relevant.  Review of Systems: As above  Physical Examination: Filed Vitals:   02/11/12 1604  BP: 120/76  Pulse: 100  Temp: 98 F (36.7 C)  TempSrc: Oral  Height: 5\' 9"  (1.753 m)  Weight: 211 lb 1.9 oz (95.763 kg)  SpO2: 98%    Body mass index is 31.18 kg/(m^2).   GEN: WDWN, NAD, Non-toxic, A & O x 3 HEENT: Atraumatic, Normocephalic. Neck supple. No masses, No LAD. Ears and Nose: No external deformity. CV: RRR, No M/G/R. No JVD. No thrill. No extra heart sounds. EXTR: No c/c/e NEURO Normal gait.  PSYCH: Normally interactive. Conversant. Not depressed or anxious appearing.  Calm demeanor.    Assessment and Plan: 1. Anxiety  clonazePAM (KLONOPIN) 0.5 MG tablet  2. DEPRESSIVE DISORDER, RCR, SEVERE      Increase Prozac to her she is taking 40 mg twice daily. Add scheduled Klonopin for now and recheck in one month.  She is having some occasional chest pressure. I  suspect that this is all likely due to  Acute stress, anxiety, and depression. Her electrocardiogram is reassuring. I'll check back with her make sure that she is improving and stabilizing. Recheck in one month.  EKG: Normal sinus rhythm. Normal axis, normal R wave progression, No acute ST elevation or depression.

## 2012-02-11 NOTE — Patient Instructions (Addendum)
Change the prozac 1 in the morning and 1 at night   Recheck in 1 month

## 2012-03-09 ENCOUNTER — Other Ambulatory Visit: Payer: Self-pay

## 2012-03-09 MED ORDER — ATORVASTATIN CALCIUM 40 MG PO TABS: 40.0000 mg | ORAL_TABLET | Freq: Every day | ORAL | Status: DC

## 2012-03-09 NOTE — Telephone Encounter (Signed)
Pt called to speak with [name] and pt is not sure if has to get refill or new paper work for Lipitor from [employer].Pt only has 2 Lipitor left. Pt can be reached at [phone].

## 2012-03-10 NOTE — Telephone Encounter (Signed)
Forms for pt assistance for lipitor completed, faxed and mailed to [employer].  2 sample boxes of lipitor 40 mg's given- lot number [id number], exp 09/2012.

## 2012-03-14 ENCOUNTER — Ambulatory Visit (INDEPENDENT_AMBULATORY_CARE_PROVIDER_SITE_OTHER): Payer: Self-pay | Admitting: Family Medicine

## 2012-03-14 ENCOUNTER — Encounter: Payer: Self-pay | Admitting: Family Medicine

## 2012-03-14 VITALS — BP 140/92 | HR 94 | Temp 98.4°F | Ht 69.0 in | Wt 207.1 lb

## 2012-03-14 DIAGNOSIS — R0600 Dyspnea, unspecified: Secondary | ICD-10-CM

## 2012-03-14 DIAGNOSIS — E119 Type 2 diabetes mellitus without complications: Secondary | ICD-10-CM

## 2012-03-14 DIAGNOSIS — Z79899 Other long term (current) drug therapy: Secondary | ICD-10-CM

## 2012-03-14 DIAGNOSIS — R0609 Other forms of dyspnea: Secondary | ICD-10-CM

## 2012-03-14 DIAGNOSIS — R079 Chest pain, unspecified: Secondary | ICD-10-CM

## 2012-03-14 DIAGNOSIS — R002 Palpitations: Secondary | ICD-10-CM

## 2012-03-14 DIAGNOSIS — Z87891 Personal history of nicotine dependence: Secondary | ICD-10-CM

## 2012-03-14 DIAGNOSIS — R000 Tachycardia, unspecified: Secondary | ICD-10-CM

## 2012-03-14 LAB — CBC WITH DIFFERENTIAL/PLATELET
Basophils Absolute: 0 10*3/uL (ref 0.0–0.1)
Basophils Relative: 0.5 % (ref 0.0–3.0)
Eosinophils Absolute: 0.1 10*3/uL (ref 0.0–0.7)
Eosinophils Relative: 2 % (ref 0.0–5.0)
HCT: 40.5 % (ref 36.0–46.0)
Hemoglobin: 13.6 g/dL (ref 12.0–15.0)
Lymphocytes Relative: 26.2 % (ref 12.0–46.0)
Lymphs Abs: 1.7 10*3/uL (ref 0.7–4.0)
MCHC: 33.6 g/dL (ref 30.0–36.0)
MCV: 101.4 fl — ABNORMAL HIGH (ref 78.0–100.0)
Monocytes Absolute: 0.5 10*3/uL (ref 0.1–1.0)
Monocytes Relative: 7.2 % (ref 3.0–12.0)
Neutro Abs: 4.3 10*3/uL (ref 1.4–7.7)
Neutrophils Relative %: 64.1 % (ref 43.0–77.0)
Platelets: 208 10*3/uL (ref 150.0–400.0)
RBC: 4 Mil/uL (ref 3.87–5.11)
RDW: 12.1 % (ref 11.5–14.6)
WBC: 6.7 10*3/uL (ref 4.5–10.5)

## 2012-03-14 LAB — BASIC METABOLIC PANEL
BUN: 10 mg/dL (ref 6–23)
CO2: 26 mEq/L (ref 19–32)
Calcium: 9.2 mg/dL (ref 8.4–10.5)
Chloride: 105 mEq/L (ref 96–112)
Creatinine, Ser: 0.8 mg/dL (ref 0.4–1.2)
GFR: 83.27 mL/min (ref >60.00)
Glucose, Bld: 108 mg/dL — ABNORMAL HIGH (ref 70–99)
Potassium: 4.5 mEq/L (ref 3.5–5.1)
Sodium: 140 mEq/L (ref 135–145)

## 2012-03-14 LAB — MICROALBUMIN / CREATININE URINE RATIO
Creatinine,U: 39 mg/dL
Microalb Creat Ratio: 1 mg/g (ref 0.0–30.0)
Microalb, Ur: 0.4 mg/dL (ref 0.0–1.9)

## 2012-03-14 LAB — HEPATIC FUNCTION PANEL
ALT: 22 U/L (ref 0–35)
AST: 19 U/L (ref 0–37)
Albumin: 4.2 g/dL (ref 3.5–5.2)
Alkaline Phosphatase: 43 U/L (ref 39–117)
Bilirubin, Direct: 0 mg/dL (ref 0.0–0.3)
Total Bilirubin: 0.5 mg/dL (ref 0.3–1.2)
Total Protein: 7 g/dL (ref 6.0–8.3)

## 2012-03-14 LAB — TSH: TSH: 0.52 u[IU]/mL (ref 0.35–5.50)

## 2012-03-14 NOTE — Patient Instructions (Signed)
REFERRAL: GO THE THE FRONT ROOM AT THE ENTRANCE OF OUR CLINIC, NEAR CHECK IN. ASK FOR MARION. SHE WILL HELP YOU SET UP YOUR REFERRAL. DATE: TIME:  

## 2012-03-14 NOTE — Progress Notes (Signed)
Patient Name: Amanda [name] Date of Birth: 1951-04-10 Age: 61 y.o. Medical Record Number: [id number] Gender: female Date of Encounter: 03/14/2012  History of Present Illness:  Amanda [name] is a 61 y.o. very pleasant female patient who presents with the following: f/u multiple problems  Intermittent tachycardia and palpitations: Last night when eating dinner did not feel all that well, still feels a little fuzzy with some blurry vision. Pulse was over 100 - 102, 105, 111. She notices she has had some elevated pulse rates intermittently even without any physical exertion they're often in the 100s. She does smoke, and also drinks 2 very large cups of coffee and drinks [facility] during the day.  BP was a little bit elevated also - 135/73, 120/60, 132/71 standing. Felt a little weird.   Anxiety and depression: Feeling more calm. Does not take klonopin during the day, sometimes at nice. Not as wound up today. Feels weird in her head some. We did increase her Prozac dosing to twice daily at the maximal 40 mg b.i.d. Dosing. She is doing better, but still has some underlying anxiety. She is rarely taking some Klonopin  Chest pressure and dyspnea Has had some palpitations off and on. When 12, had some hypothyroidism. Sometimes, heart rate will be going faster.  Also will still have some chest pressure and discomfort, feels almost bruised, worse the more stressed she will get.  If doing yardwork will get short of breath and get some of the same bruising sensation in her left chest.  She describes a deep pressure on the LEFT side of her anterior chest and just adjacent to the sternum with exertion and heavy yard work or going up and down stairs. She also has some shortness of breath with exertion with a small amount.  Patient Active Problem List  Diagnoses  . AODM  . UNSPECIFIED VITAMIN D DEFICIENCY  . PURE HYPERCHOLESTEROLEMIA  . DEPRESSIVE DISORDER, RCR, SEVERE  . HX, PERSONAL, TOBACCO  USE   Past Medical History  Diagnosis Date  . Depression   . Anxiety   . Hyperlipidemia   . GERD (gastroesophageal reflux disease)    Past Surgical History  Procedure Date  . Tonsillectomy     4 yoa  . Ovarian cyst removal 1972  . Abdominal hysterectomy 1993  . Fracture surgery 06/2002    right leg  . Wrist fracture surgery 11/2008   History  Substance Use Topics  . Smoking status: Never Smoker   . Smokeless tobacco: Not on file  . Alcohol Use: Not on file   Family History  Problem Relation Age of Onset  . Hyperlipidemia Mother   . Arthritis Mother   . Diabetes Mother   . Cancer Paternal Grandmother     breast   Allergies  Allergen Reactions  . Diazepam     REACTION: unspecified   Current Outpatient Prescriptions on File Prior to Visit  Medication Sig Dispense Refill  . amitriptyline (ELAVIL) 50 MG tablet TAKE ONE TABLET BY MOUTH NIGHTLY AT BEDTIME  30 tablet  5  . atorvastatin (LIPITOR) 40 MG tablet Take 1 tablet (40 mg total) by mouth daily.  90 tablet  3  . clonazePAM (KLONOPIN) 0.5 MG tablet Take 1 tablet (0.5 mg total) by mouth 2 (two) times daily as needed for anxiety.  60 tablet  1  . esomeprazole (NEXIUM) 40 MG capsule Take 1 capsule (40 mg total) by mouth daily before breakfast.  90 capsule  3  . FLUoxetine (PROZAC)  40 MG capsule TAKE TWO CAPSULES BY MOUTH DAILY  60 capsule  5  . naproxen sodium (ANAPROX) 220 MG tablet Take two tablets daily.      . nitroGLYCERIN (NITROSTAT) 0.4 MG SL tablet Place 0.4 mg under the tongue every 5 (five) minutes as needed.         Past Medical History, Surgical History, Social History, Family History, Problem List, Medications, and Allergies have been reviewed and updated if relevant.  Review of Systems: As above. Some shortness of breath and chest pain. Worsened by exertion. Some tachycardia and palpitations. Improved depression. No recent laboratories.  Physical Examination: Filed Vitals:   03/14/12 1143  BP: 140/92    Pulse: 94  Temp: 98.4 F (36.9 C)  TempSrc: Oral  Height: 5\' 9"  (1.753 m)  Weight: 207 lb 1.9 oz (93.949 kg)  SpO2: 97%    Body mass index is 30.59 kg/(m^2).   GEN: WDWN, NAD, Non-toxic, A & O x 3 HEENT: Atraumatic, Normocephalic. Neck supple. No masses, No LAD. Ears and Nose: No external deformity. CV: RRR, No M/G/R. No JVD. No thrill. No extra heart sounds. PULM: CTA B, no wheezes, crackles, rhonchi. No retractions. No resp. distress. No accessory muscle use. EXTR: No c/c/e NEURO Normal gait.  PSYCH: Normally interactive. Conversant. Not depressed or anxious appearing.  Calm demeanor.    Assessment and Plan:  1. Dyspnea  CBC with Differential, Ambulatory referral to Cardiology  2. Chest pain  Ambulatory referral to Cardiology  3. Tachycardia  TSH, Ambulatory referral to Cardiology  4. Palpitations  TSH, Ambulatory referral to Cardiology  5. AODM  Basic metabolic panel, Hemoglobin A1c, Microalbumin / creatinine urine ratio  6. HX, PERSONAL, TOBACCO USE    7. Encounter for long-term (current) use of other medications  CBC with Differential, Hepatic function panel   Check labs as above.  Anxiety and depression are improved.  Dyspnea and chest pain on exertion. Tachycardia and palpitations without provocation. Multiple cardiac risk factors including tobacco abuse, hyperlipidemia, obesity. BP elevated today. Consult Cardiology  Orders Today: Orders Placed This Encounter  Procedures  . Basic metabolic panel  . CBC with Differential  . Hemoglobin A1c  . Hepatic function panel  . TSH  . Microalbumin / creatinine urine ratio  . Ambulatory referral to Cardiology    Referral Priority:  Routine    Referral Type:  Consultation    Referral Reason:  Specialty Services Required    Requested Specialty:  Cardiology    Number of Visits Requested:  1    Medications Today: No orders of the defined types were placed in this encounter.

## 2012-03-15 ENCOUNTER — Telehealth: Payer: Self-pay | Admitting: *Deleted

## 2012-03-15 ENCOUNTER — Encounter: Payer: Self-pay | Admitting: Cardiovascular Disease

## 2012-03-15 ENCOUNTER — Ambulatory Visit (INDEPENDENT_AMBULATORY_CARE_PROVIDER_SITE_OTHER): Payer: Self-pay | Admitting: Cardiovascular Disease

## 2012-03-15 VITALS — BP 150/82 | HR 88 | Ht 69.0 in | Wt 207.8 lb

## 2012-03-15 DIAGNOSIS — R002 Palpitations: Secondary | ICD-10-CM

## 2012-03-15 DIAGNOSIS — R011 Cardiac murmur, unspecified: Secondary | ICD-10-CM

## 2012-03-15 DIAGNOSIS — R079 Chest pain, unspecified: Secondary | ICD-10-CM | POA: Insufficient documentation

## 2012-03-15 DIAGNOSIS — Z720 Tobacco use: Secondary | ICD-10-CM | POA: Insufficient documentation

## 2012-03-15 DIAGNOSIS — I10 Essential (primary) hypertension: Secondary | ICD-10-CM | POA: Insufficient documentation

## 2012-03-15 NOTE — Assessment & Plan Note (Signed)
She has a faint cardiac murmur at the base which is likely a flow murmur and does not seem to be pathologic. However, she does have significant exertional dyspnea. Thus I will obtain an echocardiogram.

## 2012-03-15 NOTE — Assessment & Plan Note (Signed)
Her chest pain seems to be overall atypical but she has multiple risk factors for coronary artery disease. I recommend evaluation with a treadmill stress test.

## 2012-03-15 NOTE — Patient Instructions (Signed)
Your physician recommends that you schedule a follow-up appointment in: AFTER ALL TEST HAVE BEEN COMPLETED TO SEE DR. Kirke Corin  Your physician has requested that you have an echocardiogram DX PALPITATIONS 785.1. Echocardiography is a painless test that uses sound waves to create images of your heart. It provides your doctor with information about the size and shape of your heart and how well your heart's chambers and valves are working. This procedure takes approximately one hour. There are no restrictions for this procedure.   SCHEDULE A GXT PLEASE TO BE DONE IN THE [facility] OFFICE  Your physician has recommended that you wear a 48 HOUR holter monitor DX 785.1 PALIPTATIONS. Holter monitors are medical devices that record the heart's electrical activity. Doctors most often use these monitors to diagnose arrhythmias. Arrhythmias are problems with the speed or rhythm of the heartbeat. The monitor is a small, portable device. You can wear one while you do your normal daily activities. This is usually used to diagnose what is causing palpitations/syncope (passing out).

## 2012-03-15 NOTE — Assessment & Plan Note (Signed)
Her current symptoms are suggestive of sinus tachycardia and premature beats. I suspect that these are mostly triggered by anxiety. She also consumes large amounts of caffeine and nicotine which is likely contributing. She had routine labs checked including thyroid function yesterday and the results are still pending. Other forms of cardiac arrhythmia will need to be evaluated. Thus, I recommend a 48-hour Holter monitor. Most likely, I will start her on a small dose of a beta blocker upon followup.

## 2012-03-15 NOTE — Progress Notes (Signed)
Addended by: Tarri Fuller on: 03/15/2012 10:26 AM   Modules accepted: Orders

## 2012-03-15 NOTE — Telephone Encounter (Signed)
Lipitor 40 mg's received from [employer] patient assistance, one bottle of # 90.  Lot number [id number], exp 12/2014.  Left message on machine advising patient ok to pick up.

## 2012-03-15 NOTE — Progress Notes (Signed)
HPI  This is a 61 year old female who is referred by Dr. Dallas Schimke for evaluation of chest pain and palpitations. The patient has no previous cardiac history. She has chronic medical conditions that include hyperlipidemia, tobacco use, GERD and anxiety. She has been having frequent episodes of palpitations described as fast heart beats. She checked her pulse and blood pressure on few occasions and found her heart rate to be 105 beats per minute. These episodes usually happens in the setting of stress and anxiety. These palpitations have been associated with left-sided aching sensation in her chest radiating to the back. This discomfort is not usually exertional. She does complain of progressive exertional dyspnea. She continues to smoke about one pack per day. She has been under significant stress at home due to illness of her husband who is having multiple colon and abdominal surgeries. He also has extensive cardiac history and follows up with [place]. 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 She denies any syncope or presyncope. She has not had any recent cardiac evaluations. She consumes large amounts of caffeine. She had blood work done yesterday but the results are not available.  She has been on Prozac for a while. She was recently started on Klonipin. She has not noticed significant improvement.  Allergies  Allergen Reactions  . Diazepam     REACTION: unspecified     Current Outpatient Prescriptions on File Prior to Visit  Medication Sig Dispense Refill  . amitriptyline (ELAVIL) 50 MG tablet TAKE ONE TABLET BY MOUTH NIGHTLY AT BEDTIME  30 tablet  5  . atorvastatin (LIPITOR) 40 MG tablet Take 1 tablet (40 mg total) by mouth daily.  90 tablet  3  . esomeprazole (NEXIUM) 40 MG capsule Take 1 capsule (40 mg total) by mouth daily before breakfast.  90 capsule  3  . FLUoxetine (PROZAC) 40 MG capsule TAKE TWO CAPSULES BY MOUTH DAILY  60 capsule  5  . naproxen sodium  (ANAPROX) 220 MG tablet Take two tablets daily.      . nitroGLYCERIN (NITROSTAT) 0.4 MG SL tablet Place 0.4 mg under the tongue every 5 (five) minutes as needed.      . clonazePAM (KLONOPIN) 0.5 MG tablet Take 1 tablet (0.5 mg total) by mouth 2 (two) times daily as needed for anxiety.  60 tablet  1     Past Medical History  Diagnosis Date  . Depression   . Anxiety   . Hyperlipidemia   . GERD (gastroesophageal reflux disease)   . Tobacco abuse   . Hypertension      Past Surgical History  Procedure Date  . Tonsillectomy     4 yoa  . Ovarian cyst removal 1972  . Abdominal hysterectomy 1993  . Fracture surgery 06/2002    right leg  . Wrist fracture surgery 11/2008     Family History  Problem Relation Age of Onset  . Hyperlipidemia Mother   . Arthritis Mother   . Diabetes Mother   . Cancer Paternal Grandmother     breast     History   Social History  . Marital Status: Married    Spouse Name: N/A    Number of Children: 2  . Years of Education: N/A   Occupational History  . [employer]    Social History Main Topics  . Smoking status: Never Smoker   . Smokeless tobacco: Not on file  . Alcohol Use: Not on file  .  Drug Use: Not on file  . Sexually Active: Not on file   Other Topics Concern  . Not on file   Social History Narrative  . No narrative on file     ROS Constitutional: Negative for fever, chills, diaphoresis, activity change.  HENT: Negative for hearing loss, nosebleeds, congestion, sore throat, facial swelling, drooling, trouble swallowing, neck pain, voice change, sinus pressure and tinnitus.  Eyes: Negative for photophobia, pain, discharge and visual disturbance.  Respiratory: Negative for apnea, cough and wheezing.  Cardiovascular: Negative for  leg swelling.  Gastrointestinal: Negative for nausea, vomiting, abdominal pain, diarrhea, constipation, blood in stool and abdominal distention.  Genitourinary: Negative for  dysuria, urgency, frequency, hematuria and decreased urine volume.  Musculoskeletal: Negative for myalgias, back pain, joint swelling, arthralgias and gait problem.  Skin: Negative for color change, pallor, rash and wound.  Neurological: Negative for dizziness, tremors, seizures, syncope, speech difficulty, weakness, light-headedness, numbness and headaches.  Psychiatric/Behavioral: Negative for suicidal ideas, hallucinations, behavioral problems and agitation. The patient appears anxious.     PHYSICAL EXAM   BP 150/82  Pulse 88  Ht 5\' 9"  (1.753 m)  Wt 207 lb 12 oz (94.235 kg)  BMI 30.68 kg/m2  Constitutional: She is oriented to person, place, and time. She appears well-developed and well-nourished. No distress.  HENT: No nasal discharge.  Head: Normocephalic and atraumatic.  Eyes: Pupils are equal and round. Right eye exhibits no discharge. Left eye exhibits no discharge.  Neck: Normal range of motion. Neck supple. No JVD present. No thyromegaly present.  Cardiovascular: Normal rate, regular rhythm, normal heart sounds. Exam reveals no gallop and no friction rub. There is one out of 6 systolic ejection murmur at the base of the heart.  Pulmonary/Chest: Effort normal and breath sounds normal. No stridor. No respiratory distress. She has no wheezes. She has no rales. She exhibits no tenderness.  Abdominal: Soft. Bowel sounds are normal. She exhibits no distension. There is no tenderness. There is no rebound and no guarding.  Musculoskeletal: Normal range of motion. She exhibits no edema and no tenderness.  Neurological: She is alert and oriented to person, place, and time. Coordination normal.  Skin: Skin is warm and dry. No rash noted. She is not diaphoretic. No erythema. No pallor.  Psychiatric: She has a normal mood and affect. Her behavior is normal. Judgment and thought content normal.    EKG: Her recent ECG was reviewed which showed normal sinus rhythm with PACs. Normal PR and QT  intervals. No significant ST or T wave changes.   ASSESSMENT AND PLAN

## 2012-03-17 ENCOUNTER — Other Ambulatory Visit (INDEPENDENT_AMBULATORY_CARE_PROVIDER_SITE_OTHER): Payer: Self-pay

## 2012-03-17 ENCOUNTER — Other Ambulatory Visit: Payer: Self-pay

## 2012-03-17 DIAGNOSIS — R002 Palpitations: Secondary | ICD-10-CM

## 2012-03-17 LAB — HEMOGLOBIN A1C: Hgb A1c MFr Bld: 6.2 % (ref 4.6–6.5)

## 2012-03-18 ENCOUNTER — Encounter: Payer: Self-pay | Admitting: *Deleted

## 2012-03-21 ENCOUNTER — Ambulatory Visit (INDEPENDENT_AMBULATORY_CARE_PROVIDER_SITE_OTHER): Payer: Self-pay | Admitting: Cardiovascular Disease

## 2012-03-21 ENCOUNTER — Other Ambulatory Visit: Payer: Self-pay

## 2012-03-21 VITALS — BP 106/68 | Ht 69.0 in | Wt 207.0 lb

## 2012-03-21 DIAGNOSIS — R002 Palpitations: Secondary | ICD-10-CM

## 2012-03-21 NOTE — Telephone Encounter (Signed)
Pt left v/m wondering if Nexium paperwork was done. Pt request call back from [place] [phone].

## 2012-03-21 NOTE — Progress Notes (Signed)
    Treadmill Stress test  Indication: Palpitations and atypical chest pain.  Baseline Data:  Resting EKG shows NSR with rate of 98 bpm, no significant ST changes. Resting blood pressure of 106/68 mm Hg Stand bruce protocal was used.  Exercise Data:  Patient exercised for 5 min 0 sec,  Peak heart rate of 143 bpm.  This was 90 % of the maximum predicted heart rate. No symptoms of chest pain or lightheadedness were reported at peak stress or in recovery. She did complain of dyspnea. Peak Blood pressure recorded was 160/72 Maximal work level: 7 METs.  Heart rate at 3 minutes in recovery was 106 bpm. BP response: Normal HR response: Normal  EKG with Exercise: Sinus tachycardia with no significant ST changes.  FINAL IMPRESSION: Normal exercise stress test. No significant EKG changes concerning for ischemia. Average exercise tolerance.

## 2012-03-22 MED ORDER — ESOMEPRAZOLE MAGNESIUM 40 MG PO CPDR: 40.0000 mg | DELAYED_RELEASE_CAPSULE | Freq: Every day | ORAL | Status: DC

## 2012-03-24 NOTE — Telephone Encounter (Signed)
Paper work for [employer] patient assistance completed for nexium, faxed in this morning.

## 2012-03-30 ENCOUNTER — Telehealth: Payer: Self-pay | Admitting: *Deleted

## 2012-03-30 ENCOUNTER — Ambulatory Visit: Payer: Self-pay

## 2012-03-30 NOTE — Telephone Encounter (Signed)
Called patient to inform patient about her normal Holter Monitor results. Patient will return call if she so happens to have any additional questions.

## 2012-04-01 ENCOUNTER — Encounter: Payer: Self-pay | Admitting: Cardiovascular Disease

## 2012-04-01 ENCOUNTER — Ambulatory Visit (INDEPENDENT_AMBULATORY_CARE_PROVIDER_SITE_OTHER): Payer: Self-pay | Admitting: Cardiovascular Disease

## 2012-04-01 VITALS — BP 122/72 | HR 82 | Ht 67.0 in | Wt 206.0 lb

## 2012-04-01 DIAGNOSIS — R079 Chest pain, unspecified: Secondary | ICD-10-CM

## 2012-04-01 DIAGNOSIS — R002 Palpitations: Secondary | ICD-10-CM

## 2012-04-01 DIAGNOSIS — R011 Cardiac murmur, unspecified: Secondary | ICD-10-CM

## 2012-04-01 MED ORDER — METOPROLOL TARTRATE 25 MG PO TABS: 25.0000 mg | ORAL_TABLET | Freq: Two times a day (BID) | ORAL | Status: DC

## 2012-04-01 NOTE — Assessment & Plan Note (Signed)
Her current symptoms are likely due sinus tachycardia and premature beats. No other serious arrhythmia were noted. I suspect that these are mostly triggered by anxiety. She also consumes large amounts of caffeine and nicotine which is likely contributing.  I discussed with him the importance of smoking cessation.  I will start her on metoprolol 25 mg twice daily for symptomatic relief.

## 2012-04-01 NOTE — Assessment & Plan Note (Signed)
Her stress test showed no evidence of ischemia. She has not had any further chest pain.

## 2012-04-01 NOTE — Patient Instructions (Signed)
Start Metoprolol  Follow up in 6 months.

## 2012-04-01 NOTE — Assessment & Plan Note (Signed)
This is likely a functional murmur. Echocardiogram showed only mild mitral regurgitation.

## 2012-04-01 NOTE — Progress Notes (Signed)
HPI  This is a 61 year old female who is here today for a followup visit. She was seen recently for atypical chest pain and palpitations. Some of her symptoms were felt to be due to anxiety. She is trying to cut down her caffeine intake. She underwent a treadmill stress test which showed no evidence of ischemia. She had an echocardiogram done which showed normal LV systolic function, mild mitral regurgitation and no evidence of pulmonary hypertension. A 48 hour Holter monitor showed intermittent sinus tachycardia with occasional PACs but no other significant arrhythmias. Her palpitations have improved but she still have frequent episodes of fast heartbeats especially when she is under stress.  Allergies  Allergen Reactions  . Diazepam     REACTION: unspecified     Current Outpatient Prescriptions on File Prior to Visit  Medication Sig Dispense Refill  . amitriptyline (ELAVIL) 50 MG tablet TAKE ONE TABLET BY MOUTH NIGHTLY AT BEDTIME  30 tablet  5  . Ascorbic Acid (VITAMIN C) 1000 MG tablet Take 1,000 mg by mouth daily.      Marland Kitchen aspirin 81 MG tablet Take 81 mg by mouth daily.      Marland Kitchen atorvastatin (LIPITOR) 40 MG tablet Take 1 tablet (40 mg total) by mouth daily.  90 tablet  3  . Cholecalciferol (VITAMIN D) 1000 UNITS capsule Take 1,000 Units by mouth daily.      . clonazePAM (KLONOPIN) 0.5 MG tablet Take 0.5 mg by mouth 2 (two) times daily as needed.      . cyclobenzaprine (FLEXERIL) 10 MG tablet Take 10 mg by mouth 3 (three) times daily as needed.      Marland Kitchen esomeprazole (NEXIUM) 40 MG capsule Take 1 capsule (40 mg total) by mouth daily before breakfast.  90 capsule  3  . FLUoxetine (PROZAC) 40 MG capsule TAKE TWO CAPSULES BY MOUTH DAILY  60 capsule  5  . Multiple Vitamin (MULTIVITAMIN) tablet Take 1 tablet by mouth daily.      . naproxen sodium (ANAPROX) 220 MG tablet Take two tablets daily.      . clonazePAM (KLONOPIN) 0.5 MG tablet Take 1 tablet (0.5 mg total) by mouth 2 (two) times daily as  needed for anxiety.  60 tablet  1  . metoprolol tartrate (LOPRESSOR) 25 MG tablet Take 1 tablet (25 mg total) by mouth 2 (two) times daily.  60 tablet  6     Past Medical History  Diagnosis Date  . Depression   . Anxiety   . Hyperlipidemia   . GERD (gastroesophageal reflux disease)   . Tobacco abuse   . Hypertension      Past Surgical History  Procedure Date  . Tonsillectomy     4 yoa  . Ovarian cyst removal 1972  . Abdominal hysterectomy 1993  . Fracture surgery 06/2002    right leg  . Wrist fracture surgery 11/2008     Family History  Problem Relation Age of Onset  . Hyperlipidemia Mother   . Arthritis Mother   . Diabetes Mother   . Cancer Paternal Grandmother     breast     History   Social History  . Marital Status: Married    Spouse Name: N/A    Number of Children: 2  . Years of Education: N/A   Occupational History  . [employer]    Social History Main Topics  . Smoking status: Never Smoker   . Smokeless tobacco: Not on file  . Alcohol Use: Not  on file  . Drug Use: Not on file  . Sexually Active: Not on file   Other Topics Concern  . Not on file   Social History Narrative  . No narrative on file     PHYSICAL EXAM   BP 122/72  Pulse 82  Ht 5\' 7"  (1.702 m)  Wt 206 lb (93.441 kg)  BMI 32.26 kg/m2  Constitutional: She is oriented to person, place, and time. She appears well-developed and well-nourished. No distress.  HENT: No nasal discharge.  Head: Normocephalic and atraumatic.  Eyes: Pupils are equal and round. Right eye exhibits no discharge. Left eye exhibits no discharge.  Neck: Normal range of motion. Neck supple. No JVD present. No thyromegaly present.  Cardiovascular: Normal rate, regular rhythm, normal heart sounds. Exam reveals no gallop and no friction rub. No murmur heard.  Pulmonary/Chest: Effort normal and breath sounds normal. No stridor. No respiratory distress. She has no wheezes. She has no rales.  She exhibits no tenderness.  Abdominal: Soft. Bowel sounds are normal. She exhibits no distension. There is no tenderness. There is no rebound and no guarding.  Musculoskeletal: Normal range of motion. She exhibits no edema and no tenderness.  Neurological: She is alert and oriented to person, place, and time. Coordination normal.  Skin: Skin is warm and dry. No rash noted. She is not diaphoretic. No erythema. No pallor.  Psychiatric: She has a normal mood and affect. Her behavior is normal. Judgment and thought content normal.     ASSESSMENT AND PLAN

## 2012-06-22 ENCOUNTER — Telehealth: Payer: Self-pay

## 2012-06-22 NOTE — Telephone Encounter (Signed)
Lipitor order from [facility], order number [id number].

## 2012-06-22 NOTE — Telephone Encounter (Signed)
Pt left v/m for [name] to request refill for Lipitor from Manufacturer. Pt request call back when med arrives.

## 2012-06-27 ENCOUNTER — Telehealth: Payer: Self-pay | Admitting: *Deleted

## 2012-06-27 NOTE — Telephone Encounter (Signed)
Lipitor received from [facility].  Left message advising patient.  One bottle of # 90, lot number v [id number], exp 09/2014.  Placed up front for pick up.

## 2012-07-02 ENCOUNTER — Other Ambulatory Visit: Payer: Self-pay | Admitting: Family Medicine

## 2012-07-04 NOTE — Telephone Encounter (Signed)
Electronic refill request

## 2012-07-04 NOTE — Telephone Encounter (Signed)
LMOVM to schedule CPE. 

## 2012-07-04 NOTE — Telephone Encounter (Signed)
Sent, due for CPE.   

## 2012-07-09 ENCOUNTER — Other Ambulatory Visit: Payer: Self-pay | Admitting: Family Medicine

## 2012-09-11 ENCOUNTER — Other Ambulatory Visit: Payer: Self-pay | Admitting: Family Medicine

## 2012-09-12 NOTE — Telephone Encounter (Signed)
Electronic refill request.  It doesn't look like the patient has ever been seen by you.  A few visits with Dr. Patsy Lager.  No upcoming appts scheduled.  Please advise.

## 2012-09-12 NOTE — Telephone Encounter (Signed)
Sent, schedule a f/u.  Thanks.

## 2012-09-12 NOTE — Telephone Encounter (Signed)
LMOVM to schedule 30 min OV.

## 2012-09-22 ENCOUNTER — Other Ambulatory Visit: Payer: Self-pay | Admitting: *Deleted

## 2012-09-22 NOTE — Telephone Encounter (Signed)
Lipitor ordered from [facility], order number [id number].

## 2012-09-27 ENCOUNTER — Telehealth: Payer: Self-pay | Admitting: *Deleted

## 2012-09-27 NOTE — Telephone Encounter (Signed)
One bottle of lipitor 40 mg's received from [facility].  Left message advising pt ok to pick up.  Lot number [id number] exp 09/2014.

## 2012-10-10 ENCOUNTER — Other Ambulatory Visit: Payer: Self-pay | Admitting: Family Medicine

## 2012-10-10 NOTE — Telephone Encounter (Signed)
Received refill request electronically. Last office visit 03/14/12. Is it okay to refill medication?

## 2012-10-11 ENCOUNTER — Other Ambulatory Visit: Payer: Self-pay | Admitting: *Deleted

## 2012-10-11 NOTE — Telephone Encounter (Signed)
No, #30, she takes them rarely. 2 refills

## 2012-10-11 NOTE — Telephone Encounter (Signed)
Dr. Patsy Lager, # 30 would be a 15 day supply, do you want to change to # 60?

## 2012-10-11 NOTE — Telephone Encounter (Signed)
Ok to refill 30, 2 refills 

## 2012-10-11 NOTE — Telephone Encounter (Signed)
Please refill as ordered below

## 2012-10-12 MED ORDER — CLONAZEPAM 0.5 MG PO TABS: 0.5000 mg | ORAL_TABLET | Freq: Two times a day (BID) | ORAL | Status: DC | PRN

## 2012-10-12 NOTE — Telephone Encounter (Signed)
rx called to pharmacy 

## 2012-11-28 ENCOUNTER — Telehealth: Payer: Self-pay | Admitting: *Deleted

## 2012-11-28 NOTE — Telephone Encounter (Signed)
lipitor ordered from [facility], order number [id number].

## 2012-12-09 ENCOUNTER — Telehealth: Payer: Self-pay | Admitting: *Deleted

## 2012-12-09 NOTE — Telephone Encounter (Signed)
Lipitor received from [facility].  One bottle of # 90, lot number [id number], exp 12/2014.  Left message advising patient ok to pick up.

## 2013-01-18 ENCOUNTER — Telehealth: Payer: Self-pay | Admitting: Family Medicine

## 2013-01-18 NOTE — Telephone Encounter (Signed)
Patient assistance form for nexium is on your desk.  Just needs your signature.

## 2013-01-18 NOTE — Telephone Encounter (Signed)
Forms faxed to [facility]

## 2013-01-18 NOTE — Telephone Encounter (Signed)
Annis Lagoy dropped off a perscription savings program form to be filled out by Dr. Patsy Lager.

## 2013-01-31 NOTE — Telephone Encounter (Signed)
Pt left v/m received letter from [facility] [facility] needing name of med and dosage for recent form submitted for medication. Pt will bring letter to office this afternoon for [name]. Pt request call back when paperwork completed.

## 2013-02-01 ENCOUNTER — Other Ambulatory Visit: Payer: Self-pay | Admitting: *Deleted

## 2013-02-02 NOTE — Telephone Encounter (Signed)
6 sample boxes of nexium given.  Lot number [id number], EXP 06/2015.  Left message on patient's voice mail advising her they are ready for pick up at front desk.

## 2013-02-02 NOTE — Telephone Encounter (Signed)
Can you please help with this and get give some sample boxes if we have them   Hannah Beat, MD 02/02/2013, 1:42 PM

## 2013-02-02 NOTE — Telephone Encounter (Signed)
Letter that patient brought in and script for nexium faxed to [facility].  Script had been faxed with original paperwork.  Left message on pt's voice mail advising this and that we have nexium samples if patient needs any.

## 2013-02-02 NOTE — Telephone Encounter (Signed)
Pt left v/m; pt does need samples of nexium and would like to pick up samples this afternoon. Pt request call back and leave message when nexium ready for pick up.

## 2013-02-07 ENCOUNTER — Other Ambulatory Visit: Payer: Self-pay | Admitting: Family Medicine

## 2013-03-15 ENCOUNTER — Ambulatory Visit (INDEPENDENT_AMBULATORY_CARE_PROVIDER_SITE_OTHER): Payer: Self-pay | Admitting: Family Medicine

## 2013-03-15 ENCOUNTER — Ambulatory Visit (INDEPENDENT_AMBULATORY_CARE_PROVIDER_SITE_OTHER)
Admission: RE | Admit: 2013-03-15 | Discharge: 2013-03-15 | Disposition: A | Discharge status: Home / Self Care | Payer: Self-pay | Source: Ambulatory Visit | Attending: Family Medicine | Admitting: Family Medicine

## 2013-03-15 ENCOUNTER — Encounter: Payer: Self-pay | Admitting: Family Medicine

## 2013-03-15 VITALS — BP 120/72 | HR 90 | Temp 98.4°F | Ht 67.0 in | Wt 216.2 lb

## 2013-03-15 DIAGNOSIS — R05 Cough: Secondary | ICD-10-CM

## 2013-03-15 DIAGNOSIS — M25569 Pain in unspecified knee: Secondary | ICD-10-CM

## 2013-03-15 DIAGNOSIS — M25562 Pain in left knee: Secondary | ICD-10-CM

## 2013-03-15 DIAGNOSIS — J209 Acute bronchitis, unspecified: Secondary | ICD-10-CM

## 2013-03-15 DIAGNOSIS — R059 Cough, unspecified: Secondary | ICD-10-CM

## 2013-03-15 MED ORDER — AZITHROMYCIN 250 MG PO TABS: ORAL_TABLET | ORAL | Status: DC

## 2013-03-15 NOTE — Progress Notes (Signed)
Nature conservation officer at [facility] [address] [place] [address] Phone: [phone] Fax: [phone]  Date:  03/15/2013   Name:  Amanda [name]   DOB:  [birth year]   MRN:  [id number] Gender: female Age: 62 y.o.  Primary Physician:  Hannah Beat, MD  Evaluating MD: Hannah Beat, MD   Chief Complaint: Cough, Nasal Congestion and ears clogged   History of Present Illness:  Amanda [name] is a 62 y.o. pleasant patient who presents with the following:  Coughing - for 2 months. Never has really gotten better. Having some pain issues on the left side. Will start coughing really hard and get SOB. 43 pack years history No fever  Knee pain B Left knee pain hurts a lot and will wake her up. Trouble going up and down stairs and rising from a seated position.   Mood is doing pretty well.  Left posterior buttocks will hurt  Needs to quit smoking. 43 years. At most 1 ppd.     Patient Active Problem List  Diagnosis  . AODM  . UNSPECIFIED VITAMIN D DEFICIENCY  . PURE HYPERCHOLESTEROLEMIA  . DEPRESSIVE DISORDER, RCR, SEVERE  . HX, PERSONAL, TOBACCO USE  . Tobacco abuse  . Hypertension  . Cardiac murmur    Past Medical History  Diagnosis Date  . Depression   . Anxiety   . Hyperlipidemia   . GERD (gastroesophageal reflux disease)   . Tobacco abuse   . Hypertension     Past Surgical History  Procedure Laterality Date  . Tonsillectomy      4 yoa  . Ovarian cyst removal  1972  . Abdominal hysterectomy  1993  . Fracture surgery  06/2002    right leg  . Wrist fracture surgery  11/2008    History   Social History  . Marital Status: Married    Spouse Name: N/A    Number of Children: 2  . Years of Education: N/A   Occupational History  . [employer]    Social History Main Topics  . Smoking status: Never Smoker   . Smokeless tobacco: Not on file  . Alcohol Use: Not on file  . Drug Use: Not on file  . Sexually Active:  Not on file   Other Topics Concern  . Not on file   Social History Narrative  . No narrative on file    Family History  Problem Relation Age of Onset  . Hyperlipidemia Mother   . Arthritis Mother   . Diabetes Mother   . Cancer Paternal Grandmother     breast    Allergies  Allergen Reactions  . Diazepam     REACTION: unspecified    Medication list has been reviewed and updated.  Outpatient Prescriptions Prior to Visit  Medication Sig Dispense Refill  . amitriptyline (ELAVIL) 50 MG tablet TAKE ONE TABLET BY MOUTH NIGHTLY AT BEDTIME  30 tablet  5  . Ascorbic Acid (VITAMIN C) 1000 MG tablet Take 1,000 mg by mouth daily.      Marland Kitchen aspirin 81 MG tablet Take 81 mg by mouth daily.      Marland Kitchen atorvastatin (LIPITOR) 40 MG tablet Take 1 tablet (40 mg total) by mouth daily.  90 tablet  3  . Cholecalciferol (VITAMIN D) 1000 UNITS capsule Take 1,000 Units by mouth daily.      . clonazePAM (KLONOPIN) 0.5 MG tablet Take 1 tablet (0.5 mg total) by mouth 2 (two) times daily as needed.  30 tablet  2  . cyclobenzaprine (FLEXERIL) 10 MG tablet TAKE ONE TABLET BY MOUTH THREE TIMES DAILY AS NEEDED FOR MUSCLE SPASM  50 tablet  0  . esomeprazole (NEXIUM) 40 MG capsule Take 1 capsule (40 mg total) by mouth daily before breakfast.  90 capsule  3  . FLUoxetine (PROZAC) 40 MG capsule TAKE TWO CAPSULES BY MOUTH DAILY  60 capsule  1  . metoprolol tartrate (LOPRESSOR) 25 MG tablet Take 1 tablet (25 mg total) by mouth 2 (two) times daily.  60 tablet  6  . Multiple Vitamin (MULTIVITAMIN) tablet Take 1 tablet by mouth daily.      . naproxen sodium (ANAPROX) 220 MG tablet Take two tablets daily.      . clonazePAM (KLONOPIN) 0.5 MG tablet Take 1 tablet (0.5 mg total) by mouth 2 (two) times daily as needed for anxiety.  60 tablet  1   No facility-administered medications prior to visit.    Review of Systems:  ROS: GEN: Acute illness details above GI: Tolerating PO intake GU: maintaining adequate hydration and  urination Pulm: No SOB Interactive and getting along well at home.  Otherwise, ROS is as per the HPI.   Physical Examination: BP 120/72  Pulse 90  Temp(Src) 98.4 F (36.9 C) (Oral)  Ht 5\' 7"  (1.702 m)  Wt 216 lb 4 oz (98.09 kg)  BMI 33.86 kg/m2  SpO2 97%  Ideal Body Weight: Weight in (lb) to have BMI = 25: 159.3   GEN: A and O x 3. WDWN. NAD.    ENT: Nose clear, ext NML.  No LAD.  No JVD.  TM's clear. Oropharynx clear.  PULM: Normal WOB, no distress. No crackles, wheezes, rhonchi. CV: RRR, no M/G/R, No rubs, No JVD.   EXT: warm and well-perfused, No c/c/e. PSYCH: Pleasant and conversant.  L Knee: notable crepitus, pain primarily on the medial joint line. Some minimal posterior pain and minimal lateral joint line pain. Stable Lachman. Stable anterior and posterior door testing. Stable MCL and LCL. Flexion pinch causes some pain. McMurray's test is negative.  Assessment and Plan:  Acute bronchitis  Cough - Plan: DG Chest 2 View, azithromycin (ZITHROMAX Z-PAK) 250 MG tablet  Knee pain, left  Two-month history of cough. Atypical pneumonia versus potential pertussis. And a long-standing smoker, obtained chest x-ray.  Treatment antibiotics and cough medication.  Knee Injection, LEFT Patient verbally consented to procedure. Risks (including potential rare risk of infection), benefits, and alternatives explained. Sterilely prepped with Chloraprep. Ethyl cholride used for anesthesia. 8 cc Lidocaine 1% mixed with 2 cc of Depo-Medrol 40 mg injected using the anterolateral approach without difficulty. No complications with procedure and tolerated well. Patient had decreased pain post-injection.   Orders Today:  Orders Placed This Encounter  Procedures  . DG Chest 2 View    Standing Status: Future     Number of Occurrences: 1     Standing Expiration Date: 05/15/2014    Order Specific Question:  Preferred imaging location?    Answer:  [facility]    Order Specific  Question:  Reason for exam:    Answer:  cough x 2 months, 40 years smoking    Updated Medication List: (Includes new medications, updates to list, dose adjustments) Meds ordered this encounter  Medications  . azithromycin (ZITHROMAX Z-PAK) 250 MG tablet    Sig: Take 2 tablets (500 mg) on  Day 1,  followed by 1 tablet (250 mg) once daily on Days 2 through 5.  Dispense:  6 each    Refill:  0    Medications Discontinued: Medications Discontinued During This Encounter  Medication Reason  . clonazePAM (KLONOPIN) 0.5 MG tablet Error      Signed, Veronia Laprise T. Jalan Fariss, MD 03/15/2013 3:21 PM

## 2013-03-22 ENCOUNTER — Other Ambulatory Visit: Payer: Self-pay | Admitting: Family Medicine

## 2013-03-22 DIAGNOSIS — R9389 Abnormal findings on diagnostic imaging of other specified body structures: Secondary | ICD-10-CM

## 2013-03-22 DIAGNOSIS — R918 Other nonspecific abnormal finding of lung field: Secondary | ICD-10-CM

## 2013-03-27 ENCOUNTER — Ambulatory Visit (INDEPENDENT_AMBULATORY_CARE_PROVIDER_SITE_OTHER)
Admission: RE | Admit: 2013-03-27 | Discharge: 2013-03-27 | Disposition: A | Discharge status: Home / Self Care | Payer: Self-pay | Source: Ambulatory Visit | Attending: Family Medicine | Admitting: Family Medicine

## 2013-03-27 DIAGNOSIS — R222 Localized swelling, mass and lump, trunk: Secondary | ICD-10-CM

## 2013-03-27 DIAGNOSIS — R918 Other nonspecific abnormal finding of lung field: Secondary | ICD-10-CM

## 2013-03-27 DIAGNOSIS — R9389 Abnormal findings on diagnostic imaging of other specified body structures: Secondary | ICD-10-CM

## 2013-03-27 MED ORDER — IOHEXOL 300 MG/ML  SOLN
80.0000 mL | Freq: Once | INTRAMUSCULAR | Status: AC | PRN
  Administered 2013-03-27: 80 mL via INTRAVENOUS

## 2013-03-28 ENCOUNTER — Encounter: Payer: Self-pay | Admitting: Family Medicine

## 2013-03-28 DIAGNOSIS — R911 Solitary pulmonary nodule: Secondary | ICD-10-CM | POA: Insufficient documentation

## 2013-04-10 ENCOUNTER — Other Ambulatory Visit: Payer: Self-pay | Admitting: Cardiovascular Disease

## 2013-04-10 ENCOUNTER — Other Ambulatory Visit: Payer: Self-pay

## 2013-04-10 MED ORDER — ATORVASTATIN CALCIUM 40 MG PO TABS: 40.0000 mg | ORAL_TABLET | Freq: Every day | ORAL | Status: DC

## 2013-04-10 NOTE — Telephone Encounter (Signed)
Pt can no longer get Lipitor from manufacturer and pt request refill [facility] to [facility]. Pt last lipid lab 12/12/10.Please advise.

## 2013-05-09 ENCOUNTER — Other Ambulatory Visit: Payer: Self-pay | Admitting: Family Medicine

## 2013-05-11 ENCOUNTER — Other Ambulatory Visit: Payer: Self-pay | Admitting: Family Medicine

## 2013-05-11 NOTE — Telephone Encounter (Signed)
Ok, 30, 2 refills 

## 2013-05-11 NOTE — Telephone Encounter (Signed)
Last filled 01/09/13.

## 2013-05-12 MED ORDER — CLONAZEPAM 0.5 MG PO TABS: 0.5000 mg | ORAL_TABLET | Freq: Two times a day (BID) | ORAL | Status: DC | PRN

## 2013-05-12 NOTE — Telephone Encounter (Signed)
rx called to pharmacy 

## 2013-05-30 ENCOUNTER — Telehealth: Payer: Self-pay | Admitting: *Deleted

## 2013-05-30 MED ORDER — ESOMEPRAZOLE MAGNESIUM 40 MG PO CPDR: 40.0000 mg | DELAYED_RELEASE_CAPSULE | Freq: Every day | ORAL | Status: DC

## 2013-05-30 NOTE — Telephone Encounter (Signed)
[employer], patient assistance progam, is asking for new script for nexium.  Script is printed and on your desk for signature.  Patient is out of her medicine and is requesting samples.  # 4 sample boxes of #5 each given- lot number [id number], exp 08/2015.

## 2013-05-31 NOTE — Telephone Encounter (Signed)
Script faxed to [facility].

## 2013-05-31 NOTE — Telephone Encounter (Signed)
Done   Hannah Beat, MD 05/31/2013, 8:23 AM

## 2013-06-08 ENCOUNTER — Other Ambulatory Visit: Payer: Self-pay | Admitting: Family Medicine

## 2013-07-10 ENCOUNTER — Other Ambulatory Visit: Payer: Self-pay | Admitting: Cardiovascular Disease

## 2013-07-10 ENCOUNTER — Other Ambulatory Visit: Payer: Self-pay | Admitting: Family Medicine

## 2013-07-10 NOTE — Telephone Encounter (Signed)
Received refill request electronically from pharmacy. Last office visit 03/15/13. Is it okay to refill medication?

## 2013-07-12 NOTE — Telephone Encounter (Signed)
Pt called to find out why Amitriptyline 50 mg was increased to 150 mg. I advised pt med list still has 50 mg listed. Pt said she picked up refill from [facility] and last night pt took Amitriptyline 150 mg at bedtime; pt said she had a hard time waking up this morning but is OK now. Spoke with Daryel Gerald at [employer];Onalee Hua said it was their mistake and if pt would bring back 150 mg would receive reimbursement and could pick up Amitriptyline 50 mg. Pt voice understanding. Advised pt important to ck med before taking and if any question do not hesitate to call. Pt did understand that the correct medication was refilled by our office. Carlena Sax RN team lead advised to fill out safety portal also.

## 2013-07-15 NOTE — Telephone Encounter (Signed)
Noted  

## 2013-08-11 ENCOUNTER — Other Ambulatory Visit: Payer: Self-pay | Admitting: Cardiovascular Disease

## 2013-09-09 ENCOUNTER — Other Ambulatory Visit: Payer: Self-pay | Admitting: Cardiovascular Disease

## 2013-09-11 ENCOUNTER — Other Ambulatory Visit: Payer: Self-pay | Admitting: Cardiovascular Disease

## 2013-09-11 NOTE — Telephone Encounter (Signed)
LMOM TCB to schedule a follow up with Dr. Kirke Corin, patient last seen in 03/2012 and was to follow up in 6 months.

## 2013-09-18 ENCOUNTER — Ambulatory Visit (INDEPENDENT_AMBULATORY_CARE_PROVIDER_SITE_OTHER): Payer: Self-pay | Admitting: Cardiovascular Disease

## 2013-09-18 ENCOUNTER — Encounter: Payer: Self-pay | Admitting: Cardiovascular Disease

## 2013-09-18 VITALS — BP 154/86 | HR 78 | Ht 68.5 in | Wt 211.2 lb

## 2013-09-18 DIAGNOSIS — R0602 Shortness of breath: Secondary | ICD-10-CM

## 2013-09-18 DIAGNOSIS — I10 Essential (primary) hypertension: Secondary | ICD-10-CM

## 2013-09-18 DIAGNOSIS — R079 Chest pain, unspecified: Secondary | ICD-10-CM

## 2013-09-18 DIAGNOSIS — E785 Hyperlipidemia, unspecified: Secondary | ICD-10-CM

## 2013-09-18 DIAGNOSIS — E7800 Pure hypercholesterolemia, unspecified: Secondary | ICD-10-CM

## 2013-09-18 NOTE — Progress Notes (Signed)
HPI  This is a 62 year old female who is here today for a followup visit. She was seen early this year for atypical chest pain and palpitations. Some of her symptoms were felt to be due to anxiety.  She underwent a treadmill stress test which showed no evidence of ischemia. She had an echocardiogram done which showed normal LV systolic function, mild mitral regurgitation and no evidence of pulmonary hypertension. A 48 hour Holter monitor showed intermittent sinus tachycardia with occasional PACs but no other significant arrhythmias. Her palpitations significantly after adding Metoprolol. She continues to consume excessive amount of caffeine. At least 5 cups of coffie and lots of [place] and [place]. She has difficulty sleeping at night.     Allergies  Allergen Reactions  . Diazepam     REACTION: unspecified     Current Outpatient Prescriptions on File Prior to Visit  Medication Sig Dispense Refill  . amitriptyline (ELAVIL) 50 MG tablet TAKE ONE TABLET BY MOUTH NIGHTLY AT BEDTIME   30 tablet  5  . Ascorbic Acid (VITAMIN C) 1000 MG tablet Take 1,000 mg by mouth daily.      Marland Kitchen aspirin 81 MG tablet Take 81 mg by mouth daily.      Marland Kitchen atorvastatin (LIPITOR) 40 MG tablet Take 1 tablet (40 mg total) by mouth daily.  90 tablet  3  . Cholecalciferol (VITAMIN D) 1000 UNITS capsule Take 1,000 Units by mouth daily.      . clonazePAM (KLONOPIN) 0.5 MG tablet Take 1 tablet (0.5 mg total) by mouth 2 (two) times daily as needed.  30 tablet  2  . cyclobenzaprine (FLEXERIL) 10 MG tablet TAKE ONE TABLET BY MOUTH THREE TIMES DAILY AS NEEDED FOR MUSCLE SPASM  50 tablet  0  . esomeprazole (NEXIUM) 40 MG capsule Take 1 capsule (40 mg total) by mouth daily before breakfast.  90 capsule  3  . metoprolol tartrate (LOPRESSOR) 25 MG tablet TAKE ONE TABLET BY MOUTH TWICE DAILY   60 tablet  3  . Multiple Vitamin (MULTIVITAMIN) tablet Take 1 tablet by mouth daily.       No current facility-administered medications on file prior  to visit.     Past Medical History  Diagnosis Date  . Depression   . Anxiety   . Hyperlipidemia   . GERD (gastroesophageal reflux disease)   . Tobacco abuse   . Hypertension      Past Surgical History  Procedure Laterality Date  . Tonsillectomy      4 yoa  . Ovarian cyst removal  1972  . Abdominal hysterectomy  1993  . Fracture surgery  06/2002    right leg  . Wrist fracture surgery  11/2008     Family History  Problem Relation Age of Onset  . Hyperlipidemia Mother   . Arthritis Mother   . Diabetes Mother   . Cancer Paternal Grandmother     breast     History   Social History  . Marital Status: Married    Spouse Name: N/A    Number of Children: 2  . Years of Education: N/A   Occupational History  . [employer]    Social History Main Topics  . Smoking status: Never Smoker   . Smokeless tobacco: Not on file  . Alcohol Use: No  . Drug Use: No  . Sexual Activity: Not on file   Other Topics Concern  . Not on file   Social History Narrative  . No narrative on  file     PHYSICAL EXAM   BP 154/86  Pulse 78  Ht 5' 8.5" (1.74 m)  Wt 211 lb 4 oz (95.822 kg)  BMI 31.65 kg/m2  Constitutional: She is oriented to person, place, and time. She appears well-developed and well-nourished. No distress.  HENT: No nasal discharge.  Head: Normocephalic and atraumatic.  Eyes: Pupils are equal and round. Right eye exhibits no discharge. Left eye exhibits no discharge.  Neck: Normal range of motion. Neck supple. No JVD present. No thyromegaly present.  Cardiovascular: Normal rate, regular rhythm, normal heart sounds. Exam reveals no gallop and no friction rub. No murmur heard.  Pulmonary/Chest: Effort normal and breath sounds normal. No stridor. No respiratory distress. She has no wheezes. She has no rales. She exhibits no tenderness.  Abdominal: Soft. Bowel sounds are normal. She exhibits no distension. There is no tenderness. There is no  rebound and no guarding.  Musculoskeletal: Normal range of motion. She exhibits no edema and no tenderness.  Neurological: She is alert and oriented to person, place, and time. Coordination normal.  Skin: Skin is warm and dry. No rash noted. She is not diaphoretic. No erythema. No pallor.  Psychiatric: She has a normal mood and affect. Her behavior is normal. Judgment and thought content normal.   EKG: NSR.   ASSESSMENT AND PLAN

## 2013-09-18 NOTE — Patient Instructions (Signed)
Labs today.   Continue same medications.   Your physician wants you to follow-up in: 12 month.  You will receive a reminder letter in the mail two months in advance. If you don't receive a letter, please call our office to schedule the follow-up appointment.

## 2013-09-19 ENCOUNTER — Telehealth: Payer: Self-pay

## 2013-09-19 LAB — HEPATIC FUNCTION PANEL
ALT: 17 IU/L (ref 0–32)
AST: 17 IU/L (ref 0–40)
Albumin: 4.5 g/dL (ref 3.6–4.8)
Alkaline Phosphatase: 48 IU/L (ref 39–117)
Bilirubin, Direct: 0.13 mg/dL (ref 0.00–0.40)
Total Bilirubin: 0.4 mg/dL (ref 0.0–1.2)
Total Protein: 6.5 g/dL (ref 6.0–8.5)

## 2013-09-19 LAB — LIPID PANEL
Chol/HDL Ratio: 3.8 ratio units (ref 0.0–4.4)
Cholesterol, Total: 201 mg/dL — ABNORMAL HIGH (ref 100–199)
HDL: 53 mg/dL (ref >39)
LDL Calculated: 130 mg/dL — ABNORMAL HIGH (ref 0–99)
Triglycerides: 89 mg/dL (ref 0–149)
VLDL Cholesterol Cal: 18 mg/dL (ref 5–40)

## 2013-09-19 NOTE — Telephone Encounter (Signed)
Message copied by Marilynne Halsted on Tue Sep 19, 2013 10:42 AM ------      Message from: Lorine Bears A      Created: Tue Sep 19, 2013  7:55 AM       Inform patient that labs were normal. Cholesterol was mildly high. Make sure she is taking Atorvastatin daily, improve her diet and try to exercise. ------

## 2013-09-19 NOTE — Assessment & Plan Note (Signed)
BP is mildly elevated. Palpitations are controlled with Metoprolol. I advised her to decrease Caffeine intake.

## 2013-09-19 NOTE — Telephone Encounter (Signed)
Lmom

## 2013-09-19 NOTE — Assessment & Plan Note (Signed)
Continue treatment with Atorvastatin. Check fasting lipid and liver profile today.  

## 2013-09-19 NOTE — Telephone Encounter (Signed)
Spoke w/ pt.  She is aware of results. States that she is taking her atorvastatin daily. She will work on her diet and exercise.

## 2014-01-08 ENCOUNTER — Other Ambulatory Visit: Payer: Self-pay | Admitting: Family Medicine

## 2014-01-09 NOTE — Telephone Encounter (Signed)
Last office visit 03/15/2013.  Ok to refill?

## 2014-02-04 ENCOUNTER — Other Ambulatory Visit: Payer: Self-pay | Admitting: Cardiovascular Disease

## 2014-02-06 ENCOUNTER — Other Ambulatory Visit: Payer: Self-pay

## 2014-02-06 MED ORDER — CYCLOBENZAPRINE HCL 10 MG PO TABS: ORAL_TABLET | ORAL | Status: DC

## 2014-02-06 NOTE — Telephone Encounter (Signed)
Left message for Ms. Darty that prescription has been sent to her pharmacy.

## 2014-02-06 NOTE — Telephone Encounter (Signed)
Pt left v/m; pt was shoveling snow last week and aggravated her lower back pain; pt request refill of cyclobenzaprine to [facility].Please advise.

## 2014-02-26 ENCOUNTER — Telehealth: Payer: Self-pay | Admitting: *Deleted

## 2014-02-26 NOTE — Telephone Encounter (Signed)
Forms completed and faxed to [employer] Patient Assistance Program for Nexium 40 mg at [phone].

## 2014-04-07 ENCOUNTER — Other Ambulatory Visit: Payer: Self-pay | Admitting: Family Medicine

## 2014-04-08 NOTE — Telephone Encounter (Signed)
Last office visit 03/25/2013. No future appointments scheduled.   Ok to refill?

## 2014-04-09 NOTE — Telephone Encounter (Signed)
Klonopin called to [facility].

## 2014-04-09 NOTE — Telephone Encounter (Signed)
F/u CPX in the next few months  lipitor #30, 3 refills prozac #60, 3 ref Klonopin #30, 3 ref

## 2014-05-03 ENCOUNTER — Other Ambulatory Visit (INDEPENDENT_AMBULATORY_CARE_PROVIDER_SITE_OTHER): Payer: Self-pay

## 2014-05-03 ENCOUNTER — Encounter: Payer: Self-pay | Admitting: *Deleted

## 2014-05-03 DIAGNOSIS — Z79899 Other long term (current) drug therapy: Secondary | ICD-10-CM

## 2014-05-03 DIAGNOSIS — E119 Type 2 diabetes mellitus without complications: Secondary | ICD-10-CM

## 2014-05-03 DIAGNOSIS — I10 Essential (primary) hypertension: Secondary | ICD-10-CM

## 2014-05-03 DIAGNOSIS — E7800 Pure hypercholesterolemia, unspecified: Secondary | ICD-10-CM

## 2014-05-03 DIAGNOSIS — E785 Hyperlipidemia, unspecified: Secondary | ICD-10-CM

## 2014-05-03 DIAGNOSIS — R5383 Other fatigue: Secondary | ICD-10-CM

## 2014-05-03 DIAGNOSIS — R5381 Other malaise: Secondary | ICD-10-CM

## 2014-05-03 LAB — HEPATIC FUNCTION PANEL
ALT: 18 U/L (ref 0–35)
AST: 20 U/L (ref 0–37)
Albumin: 3.8 g/dL (ref 3.5–5.2)
Alkaline Phosphatase: 38 U/L — ABNORMAL LOW (ref 39–117)
Bilirubin, Direct: 0 mg/dL (ref 0.0–0.3)
Total Bilirubin: 0.7 mg/dL (ref 0.2–1.2)
Total Protein: 6.7 g/dL (ref 6.0–8.3)

## 2014-05-03 LAB — BASIC METABOLIC PANEL
BUN: 12 mg/dL (ref 6–23)
CO2: 26 mEq/L (ref 19–32)
Calcium: 9.2 mg/dL (ref 8.4–10.5)
Chloride: 103 mEq/L (ref 96–112)
Creatinine, Ser: 0.8 mg/dL (ref 0.4–1.2)
GFR: 79.04 mL/min (ref >60.00)
Glucose, Bld: 145 mg/dL — ABNORMAL HIGH (ref 70–99)
Potassium: 4.3 mEq/L (ref 3.5–5.1)
Sodium: 138 mEq/L (ref 135–145)

## 2014-05-03 LAB — LIPID PANEL
Cholesterol: 194 mg/dL (ref 0–200)
HDL: 45.9 mg/dL (ref >39.00)
LDL Cholesterol: 127 mg/dL — ABNORMAL HIGH (ref 0–99)
Total CHOL/HDL Ratio: 4
Triglycerides: 108 mg/dL (ref 0.0–149.0)
VLDL: 21.6 mg/dL (ref 0.0–40.0)

## 2014-05-03 LAB — CBC WITH DIFFERENTIAL/PLATELET
Basophils Absolute: 0 10*3/uL (ref 0.0–0.1)
Basophils Relative: 0.5 % (ref 0.0–3.0)
Eosinophils Absolute: 0.1 10*3/uL (ref 0.0–0.7)
Eosinophils Relative: 2.4 % (ref 0.0–5.0)
HCT: 38.6 % (ref 36.0–46.0)
Hemoglobin: 13.2 g/dL (ref 12.0–15.0)
Lymphocytes Relative: 26.5 % (ref 12.0–46.0)
Lymphs Abs: 1.5 10*3/uL (ref 0.7–4.0)
MCHC: 34.3 g/dL (ref 30.0–36.0)
MCV: 100.2 fl — ABNORMAL HIGH (ref 78.0–100.0)
Monocytes Absolute: 0.4 10*3/uL (ref 0.1–1.0)
Monocytes Relative: 7.9 % (ref 3.0–12.0)
Neutro Abs: 3.6 10*3/uL (ref 1.4–7.7)
Neutrophils Relative %: 62.7 % (ref 43.0–77.0)
Platelets: 237 10*3/uL (ref 150.0–400.0)
RBC: 3.86 Mil/uL — ABNORMAL LOW (ref 3.87–5.11)
RDW: 12 % (ref 11.5–15.5)
WBC: 5.7 10*3/uL (ref 4.0–10.5)

## 2014-05-03 LAB — TSH: TSH: 0.73 u[IU]/mL (ref 0.35–4.50)

## 2014-05-03 LAB — HEMOGLOBIN A1C: Hgb A1c MFr Bld: 6.3 % (ref 4.6–6.5)

## 2014-05-03 LAB — MICROALBUMIN / CREATININE URINE RATIO
Creatinine,U: 68 mg/dL
Microalb Creat Ratio: 1.9 mg/g (ref 0.0–30.0)
Microalb, Ur: 1.3 mg/dL (ref 0.0–1.9)

## 2014-05-07 ENCOUNTER — Encounter: Payer: Self-pay | Admitting: Family Medicine

## 2014-05-07 ENCOUNTER — Other Ambulatory Visit: Payer: Self-pay | Admitting: Cardiovascular Disease

## 2014-05-21 ENCOUNTER — Encounter: Payer: Self-pay | Admitting: Family Medicine

## 2014-06-07 ENCOUNTER — Other Ambulatory Visit: Payer: Self-pay | Admitting: Family Medicine

## 2014-06-27 ENCOUNTER — Ambulatory Visit (INDEPENDENT_AMBULATORY_CARE_PROVIDER_SITE_OTHER): Payer: Self-pay | Admitting: Family Medicine

## 2014-06-27 ENCOUNTER — Encounter: Payer: Self-pay | Admitting: Family Medicine

## 2014-06-27 VITALS — BP 132/62 | HR 76 | Temp 98.2°F | Ht 66.0 in | Wt 206.2 lb

## 2014-06-27 DIAGNOSIS — E7800 Pure hypercholesterolemia, unspecified: Secondary | ICD-10-CM

## 2014-06-27 NOTE — Progress Notes (Signed)
[place] [address] Phone: [phone] Fax: [phone]  Patient ID: Amanda [name] MRN: [id number], DOB: [birth year], 63 y.o. Date of Encounter: 06/27/2014  Primary Physician:  Owens Loffler, MD   Subjective:   History of Present Illness:  Amanda [name] is a 63 y.o. very pleasant female patient who presents with the following:  She has had 4 falls in the last 14 months. Fell at school 14 months ago and has had some problems with her knees and her legs.  Worker's comp case.  Place on her back that has bothered her ever since accident.  Lower legs are hurting mid to late afternoon.   Memory - maybe is not quite as good as it was in the past and has otherwise not forgotten.   Doing some substitute teaching. Wants to start a part-time job. [place].   Mammogram and pap center at [facility].   Dep stable, tolerating all meds. Tol BP and chol meds.  Objective:   Physical Examination: BP 132/62  Pulse 76  Temp(Src) 98.2 F (36.8 C) (Oral)  Ht 5\' 6"  (1.676 m)  Wt 206 lb 4 oz (93.554 kg)  BMI 33.31 kg/m2  CV: RRR, No M/G/R. No JVD. No thrill. No extra heart sounds. PULM: CTA B, no wheezes, crackles, rhonchi. No retractions. No resp. distress. No accessory muscle use. PSYCH: Normally interactive. Conversant. Not depressed or anxious appearing.  Calm demeanor.    Assessment & Plan:   Pure hypercholesterolemia   Doing well overall.  She wants to put of some health maintenance until she gets medicare, which she understands poses some risk.   New Prescriptions   No medications on file   Modified Medications   Modified Medication Previous Medication   FLUOXETINE (PROZAC) 40 MG CAPSULE FLUoxetine (PROZAC) 40 MG capsule      TAKE ONE TABLET BY MOUTH DAILY.  MAY TAKE ADDITIONAL TABLET DAILY AS NEEDED    TAKE TWO CAPSULES BY MOUTH DAILY    No orders of the defined types were placed in this encounter.   Follow-up: 1  year Unless noted above, the patient is to follow-up if symptoms worsen. Red flags were reviewed with the patient.  Signed,  Maud Deed. Chais Fehringer, MD, [facility] Sports Medicine   Discontinued Medications   CELECOXIB (CELEBREX) 200 MG CAPSULE    Take 200 mg by mouth 2 (two) times daily.   Current Medications at Discharge:   Medication List       This list is accurate as of: 06/27/14  5:57 PM.  Always use your most recent med list.               amitriptyline 50 MG tablet  Commonly known as:  ELAVIL  TAKE ONE TABLET BY MOUTH NIGHTLY AT BEDTIME     aspirin 81 MG tablet  Take 81 mg by mouth daily.     atorvastatin 40 MG tablet  Commonly known as:  LIPITOR  TAKE ONE TABLET BY MOUTH ONE TIME DAILY     BENADRYL 25 mg capsule  Generic drug:  diphenhydrAMINE  Take 25 mg by mouth 2 (two) times daily.     clonazePAM 0.5 MG tablet  Commonly known as:  KLONOPIN  TAKE ONE TABLET BY MOUTH TWICE DAILY AS NEEDED     cyclobenzaprine 10 MG tablet  Commonly known as:  FLEXERIL  TAKE ONE TABLET BY MOUTH THREE TIMES DAILY AS NEEDED FOR MUSCLE SPASM     esomeprazole 40  MG capsule  Commonly known as:  NEXIUM  Take 1 capsule (40 mg total) by mouth daily before breakfast.     FLUoxetine 40 MG capsule  Commonly known as:  PROZAC  TAKE ONE TABLET BY MOUTH DAILY.  MAY TAKE ADDITIONAL TABLET DAILY AS NEEDED     metoprolol tartrate 25 MG tablet  Commonly known as:  LOPRESSOR  TAKE ONE TABLET BY MOUTH TWICE DAILY     multivitamin tablet  Take 1 tablet by mouth daily.     Naproxen Sodium 220 MG Caps  Take 2 capsules by mouth daily.     vitamin C 1000 MG tablet  Take 1,000 mg by mouth daily.     Vitamin D 1000 UNITS capsule  Take 1,000 Units by mouth daily.

## 2014-06-27 NOTE — Progress Notes (Signed)
Pre visit review using our clinic review tool, if applicable. No additional management support is needed unless otherwise documented below in the visit note. 

## 2014-09-05 ENCOUNTER — Other Ambulatory Visit: Payer: Self-pay | Admitting: Cardiovascular Disease

## 2014-09-05 ENCOUNTER — Other Ambulatory Visit: Payer: Self-pay | Admitting: Family Medicine

## 2014-09-09 ENCOUNTER — Other Ambulatory Visit: Payer: Self-pay | Admitting: Family Medicine

## 2014-09-09 NOTE — Telephone Encounter (Signed)
Last office visit 06/27/2014.  Ok to refill?

## 2014-10-11 ENCOUNTER — Other Ambulatory Visit: Payer: Self-pay | Admitting: Family Medicine

## 2014-10-11 NOTE — Telephone Encounter (Signed)
Last office visit 06/27/2014.  Last refilled 04/09/2014 for #30 with 3 refills.  Ok to refill?

## 2014-10-12 NOTE — Telephone Encounter (Signed)
Called to [facility] [address]

## 2014-10-12 NOTE — Telephone Encounter (Signed)
Ok to refill 30, 3 ref 

## 2014-12-10 ENCOUNTER — Other Ambulatory Visit: Payer: Self-pay | Admitting: Family Medicine

## 2014-12-10 ENCOUNTER — Other Ambulatory Visit: Payer: Self-pay | Admitting: Cardiovascular Disease

## 2014-12-10 NOTE — Telephone Encounter (Signed)
Last office visit 06/27/2014.  Last refilled 09/05/2014 for #30 with 2 refills.  Ok to refill?

## 2014-12-11 ENCOUNTER — Other Ambulatory Visit: Payer: Self-pay | Admitting: Cardiovascular Disease

## 2015-01-08 ENCOUNTER — Encounter: Payer: Self-pay | Admitting: Cardiovascular Disease

## 2015-01-08 ENCOUNTER — Ambulatory Visit (INDEPENDENT_AMBULATORY_CARE_PROVIDER_SITE_OTHER): Payer: Self-pay | Admitting: Cardiovascular Disease

## 2015-01-08 VITALS — BP 118/62 | HR 74 | Ht 69.0 in | Wt 209.0 lb

## 2015-01-08 DIAGNOSIS — R0789 Other chest pain: Secondary | ICD-10-CM

## 2015-01-08 DIAGNOSIS — R011 Cardiac murmur, unspecified: Secondary | ICD-10-CM

## 2015-01-08 NOTE — Progress Notes (Signed)
HPI  This is a 64 year old female who is here today for a followup visit. She was seen in 2013 for atypical chest pain and palpitations. Some of her symptoms were felt to be due to anxiety.  She underwent a treadmill stress test which showed no evidence of ischemia. She had an echocardiogram done which showed normal LV systolic function, mild mitral regurgitation and no evidence of pulmonary hypertension. A 48 hour Holter monitor showed intermittent sinus tachycardia with occasional PACs but no other significant arrhythmias. Her palpitations significantly after adding Metoprolol.  She has been under stress lately and had few episodes of substernal chest pain with burning sensation. This improved with antacids but also improved after she used her husband's nitroglycerin. These episodes happen at rest and not with physical activities. She had some tingling in her right arm. Overall she is feeling better.     Allergies  Allergen Reactions  . Diazepam     REACTION: unspecified     Current Outpatient Prescriptions on File Prior to Visit  Medication Sig Dispense Refill  . amitriptyline (ELAVIL) 50 MG tablet Take 1 tablet by mouth nightly at bedtime.  30 tablet 5  . Ascorbic Acid (VITAMIN C) 1000 MG tablet Take 1,000 mg by mouth daily.    Marland Kitchen aspirin 81 MG tablet Take 81 mg by mouth daily.    Marland Kitchen atorvastatin (LIPITOR) 40 MG tablet TAKE ONE TABLET BY MOUTH ONE TIME DAILY  30 tablet 5  . Cholecalciferol (VITAMIN D) 1000 UNITS capsule Take 2,000 Units by mouth daily.     . clonazePAM (KLONOPIN) 0.5 MG tablet TAKE ONE TABLET BY MOUTH TWICE DAILY AS NEEDED  30 tablet 3  . cyclobenzaprine (FLEXERIL) 10 MG tablet TAKE ONE TABLET BY MKOUTH THREE TIMES A DAY AS NEEDED FOR MUSCLE SPASMS  50 tablet 5  . diphenhydrAMINE (BENADRYL) 25 mg capsule Take 25 mg by mouth 2 (two) times daily.    Marland Kitchen esomeprazole (NEXIUM) 40 MG capsule Take 1 capsule (40 mg total) by mouth daily before breakfast. 90 capsule 3  .  FLUoxetine (PROZAC) 40 MG capsule TAKE TWO CAPSULES BY MOUTH DAILY  (Patient taking differently: Takes 1 tablet daily and 2 tablets on Tuesday, Thurs. and Saturday.) 60 capsule 5  . metoprolol tartrate (LOPRESSOR) 25 MG tablet Take 1 tablet by mouth twice daily.  60 tablet 1  . Multiple Vitamin (MULTIVITAMIN) tablet Take 1 tablet by mouth daily.    . Naproxen Sodium 220 MG CAPS Take 2 capsules by mouth daily.     No current facility-administered medications on file prior to visit.     Past Medical History  Diagnosis Date  . Depression   . Anxiety   . Hyperlipidemia   . GERD (gastroesophageal reflux disease)   . Tobacco abuse   . Hypertension      Past Surgical History  Procedure Laterality Date  . Tonsillectomy      4 yoa  . Ovarian cyst removal  1972  . Abdominal hysterectomy  1993  . Fracture surgery  06/2002    right leg  . Wrist fracture surgery  11/2008     Family History  Problem Relation Age of Onset  . Hyperlipidemia Mother   . Arthritis Mother   . Diabetes Mother   . Cancer Paternal Grandmother     breast     History   Social History  . Marital Status: Married    Spouse Name: N/A    Number of Children: 2  .  Years of Education: N/A   Occupational History  . [place] History Main Topics  . Smoking status: Current Every Day Smoker -- 1.00 packs/day    Types: Cigarettes  . Smokeless tobacco: Never Used  . Alcohol Use: Yes     Comment: occ glass of wine  . Drug Use: No  . Sexual Activity: Not on file   Other Topics Concern  . Not on file   Social History Narrative     PHYSICAL EXAM   BP 118/62 mmHg  Pulse 74  Ht 5\' 9"  (1.753 m)  Wt 209 lb (94.802 kg)  BMI 30.85 kg/m2  Constitutional: She is oriented to person, place, and time. She appears well-developed and well-nourished. No distress.  HENT: No nasal discharge.  Head: Normocephalic and atraumatic.  Eyes: Pupils are equal and round. Right eye  exhibits no discharge. Left eye exhibits no discharge.  Neck: Normal range of motion. Neck supple. No JVD present. No thyromegaly present.  Cardiovascular: Normal rate, regular rhythm, normal heart sounds. Exam reveals no gallop and no friction rub. There is a 2/6 systolic ejection murmur at the aortic area which is mid peaking.  Pulmonary/Chest: Effort normal and breath sounds normal. No stridor. No respiratory distress. She has no wheezes. She has no rales. She exhibits no tenderness.  Abdominal: Soft. Bowel sounds are normal. She exhibits no distension. There is no tenderness. There is no rebound and no guarding.  Musculoskeletal: Normal range of motion. She exhibits no edema and no tenderness.  Neurological: She is alert and oriented to person, place, and time. Coordination normal.  Skin: Skin is warm and dry. No rash noted. She is not diaphoretic. No erythema. No pallor.  Psychiatric: She has a normal mood and affect. Her behavior is normal. Judgment and thought content normal.   EKG: NSR. No significant ST or T wave changes.  ASSESSMENT AND PLAN

## 2015-01-08 NOTE — Patient Instructions (Signed)
Your physician wants you to follow-up in:  6 months. You will receive a reminder letter in the mail two months in advance. If you don't receive a letter, please call our office to schedule the follow-up appointment.   

## 2015-01-09 ENCOUNTER — Encounter: Payer: Self-pay | Admitting: Cardiovascular Disease

## 2015-01-09 DIAGNOSIS — R0789 Other chest pain: Secondary | ICD-10-CM | POA: Insufficient documentation

## 2015-01-09 NOTE — Assessment & Plan Note (Signed)
She has a cardiac murmur suggestive of aortic stenosis. Will plan on doing an echo in the near future.

## 2015-01-09 NOTE — Assessment & Plan Note (Signed)
The chest pain is overall atypical and could be due to GERD. Baseline ECG is unremarkable. Symptoms improved without intervention. The patient prefers not to proceed with any further workup at the present time given the lack of health insurance. If symptoms recur, stress test is recommended.

## 2015-02-15 ENCOUNTER — Other Ambulatory Visit: Payer: Self-pay

## 2015-02-15 MED ORDER — METOPROLOL TARTRATE 25 MG PO TABS: 25.0000 mg | ORAL_TABLET | Freq: Two times a day (BID) | ORAL | Status: DC

## 2015-02-22 LAB — HM MAMMOGRAPHY: HM Mammogram: NORMAL

## 2015-02-27 ENCOUNTER — Ambulatory Visit: Payer: Self-pay

## 2015-03-13 ENCOUNTER — Other Ambulatory Visit: Payer: Self-pay

## 2015-03-13 MED ORDER — METOPROLOL TARTRATE 25 MG PO TABS: 25.0000 mg | ORAL_TABLET | Freq: Two times a day (BID) | ORAL | Status: DC

## 2015-03-16 ENCOUNTER — Other Ambulatory Visit: Payer: Self-pay | Admitting: Family Medicine

## 2015-05-03 ENCOUNTER — Telehealth: Payer: Self-pay | Admitting: Family Medicine

## 2015-05-03 NOTE — Telephone Encounter (Signed)
[facility] Patient Name: Amanda [name] DOB: Mar 19, 1951 Initial Comment Caller states she is having left leg pain and spreading to her hip and lower back. She is also congested and has a bad cough with headaches on the left side. Nurse Assessment Nurse: Erlene Quan, RN, Manuela Schwartz Date/Time Eilene Ghazi Time): 05/03/2015 11:57:16 AM Confirm and document reason for call. If symptomatic, describe symptoms. ---Caller states she is having left leg pain and spreading to her hip and lower back with headache on L side She is also congested and has a bad cough just this week possible allergies - she has had the leg and hip pain for a long time she fell 2 years ago but was waiting to get her Medicare and it goes into effect June 1st before she got anything done about it - she saw the doctor a few months back for Rx refills and he wanted to run test then but she asked him to wait for her Medicare - been just doing OTC meds for cough Has the patient traveled out of the country within the last 30 days? ---Not Applicable Does the patient require triage? ---Yes Related visit to physician within the last 2 weeks? ---No Does the PT have any chronic conditions? (i.e. diabetes, asthma, etc.) ---Yes List chronic conditions. ---see EMR Guidelines Guideline Title Affirmed Question Affirmed Notes Leg Pain [1] MILD pain (e.g., does not interfere with normal activities) AND [2] present > 7 days Cough - Acute Productive ALSO, mild central chest pain occurs only when coughing Final Disposition [place], RN, Manuela Schwartz Comments appointment scheduled with Dr Edilia Bo May 08, 2015 at 10:15 am

## 2015-05-03 NOTE — Telephone Encounter (Signed)
Pt has appt to see Dr Lorelei Pont on 05/08/15.

## 2015-05-08 ENCOUNTER — Encounter: Payer: Self-pay | Admitting: Family Medicine

## 2015-05-08 ENCOUNTER — Ambulatory Visit (INDEPENDENT_AMBULATORY_CARE_PROVIDER_SITE_OTHER): Payer: Medicare Other | Admitting: Family Medicine

## 2015-05-08 VITALS — BP 138/70 | HR 74 | Temp 98.5°F | Ht 66.25 in | Wt 201.5 lb

## 2015-05-08 DIAGNOSIS — M25562 Pain in left knee: Secondary | ICD-10-CM

## 2015-05-08 DIAGNOSIS — Z720 Tobacco use: Secondary | ICD-10-CM | POA: Diagnosis not present

## 2015-05-08 DIAGNOSIS — J189 Pneumonia, unspecified organism: Secondary | ICD-10-CM

## 2015-05-08 DIAGNOSIS — F17200 Nicotine dependence, unspecified, uncomplicated: Secondary | ICD-10-CM

## 2015-05-08 MED ORDER — AZITHROMYCIN 250 MG PO TABS: ORAL_TABLET | ORAL | Status: DC

## 2015-05-08 MED ORDER — PREDNISONE 20 MG PO TABS: ORAL_TABLET | ORAL | Status: DC

## 2015-05-08 NOTE — Progress Notes (Signed)
Pre visit review using our clinic review tool, if applicable. No additional management support is needed unless otherwise documented below in the visit note. 

## 2015-05-08 NOTE — Progress Notes (Signed)
Dr. Frederico Hamman T. Ameir Faria, MD, [facility] [place], [address] Phone: [phone] Fax: [phone]  05/08/2015  Patient: Amanda [name], MRN: [id number], DOB: 1951-12-06, 64 y.o.  Primary Physician:  Owens Loffler, MD  Chief Complaint: Cough; Leg Pain; Knee Pain; Hip Pain; Back Pain; and Shoulder Pain  Subjective:   Amanda [name] is a 64 y.o. very pleasant female patient who presents with the following:  Congestion and ears are blocked up for 2 weeks and felt bad and was coughing a lot. Took some delsym and that helped a little. Also took some Benadryl, then some robitussin. Coughing is gone. Pain in back. Some pain in the chest at the same level.   Smokes now - < 1 ppd.  R ML PNA.   L knee has been hurting. Had a bad fall at [facility] and pain in his left knee and her lower back. Xrays were no broken bones. Problems since them. Intermittent swelling.   Past Medical History, Surgical History, Social History, Family History, Problem List, Medications, and Allergies have been reviewed and updated if relevant.  GEN: as above MSK: Detailed in the HPI GI: tolerating PO intake without difficulty Neuro: No numbness, parasthesias, or tingling associated. Otherwise the pertinent positives of the ROS are noted above.   Objective:   BP 138/70 mmHg  Pulse 74  Temp(Src) 98.5 F (36.9 C) (Oral)  Ht 5' 6.25" (1.683 m)  Wt 201 lb 8 oz (91.4 kg)  BMI 32.27 kg/m2  SpO2 98%   GEN: A and O x 3. WDWN. NAD.    ENT: Nose clear, ext NML.  No LAD.  No JVD.  TM's clear. Oropharynx clear.  PULM: Normal WOB, no distress. Crackles in the RML. No wheezing.  CV: RRR, no M/G/R, No rubs, No JVD.   EXT: warm and well-perfused, No c/c/e. PSYCH: Pleasant and conversant.   Knee:  L Gait: Normal heel toe pattern ROM: lack of 2 deg ext, flexion to 105 Effusion: mild Echymosis or edema: none Patellar tendon NT Painful PLICA:  neg Patellar grind: negative Medial and lateral patellar facet loading: negative medial and lateral joint lines: medial joint line tender PES bursa ttp Mcmurray's pain Flexion-pinch pain Varus and valgus stress: stable Lachman: neg Ant and Post drawer: neg Hip abduction, IR, ER: WNL Hip flexion str: 5/5 Hip abd: 5/5 Quad: 5/5 VMO atrophy:No Hamstring concentric and eccentric: 5/5   Radiology: No results found.  Assessment and Plan:   CAP (community acquired pneumonia)  Smoker  Left knee pain  Chest pain likely from active PNA, abnormal exam. Treat PNA with f/u  Very likely underlying COPD  Internal derangement, L knee. Reeval in 1 mo  Follow-up: Return in about 1 month (around 06/07/2015).  New Prescriptions   AZITHROMYCIN (ZITHROMAX) 250 MG TABLET    2 tabs po on day 1, then 1 tab po for 4 days   PREDNISONE (DELTASONE) 20 MG TABLET    2 tabs po for 5 days, then 1 tab po for 5 days   No orders of the defined types were placed in this encounter.    Signed,  Maud Deed. Chalene Treu, MD   Patient's Medications  New Prescriptions   AZITHROMYCIN (ZITHROMAX) 250 MG TABLET    2 tabs po on day 1, then 1 tab po for 4 days   PREDNISONE (DELTASONE) 20 MG TABLET    2 tabs po for 5 days, then 1 tab po  for 5 days  Previous Medications   AMITRIPTYLINE (ELAVIL) 50 MG TABLET    Take 1 tablet by mouth nightly at bedtime.    ASCORBIC ACID (VITAMIN C) 1000 MG TABLET    Take 1,000 mg by mouth daily.   ASPIRIN 81 MG TABLET    Take 81 mg by mouth daily.   ATORVASTATIN (LIPITOR) 40 MG TABLET    TAKE ONE TABLET BY MOUTH ONCE DAILY   CHOLECALCIFEROL (VITAMIN D) 1000 UNITS CAPSULE    Take 2,000 Units by mouth daily.    CLONAZEPAM (KLONOPIN) 0.5 MG TABLET    TAKE ONE TABLET BY MOUTH TWICE DAILY AS NEEDED    CYCLOBENZAPRINE (FLEXERIL) 10 MG TABLET    TAKE ONE TABLET BY MKOUTH THREE TIMES A DAY AS NEEDED FOR MUSCLE SPASMS    DIPHENHYDRAMINE (BENADRYL) 25 MG CAPSULE    Take 25 mg by mouth 2  (two) times daily.   ESOMEPRAZOLE (NEXIUM) 40 MG CAPSULE    Take 1 capsule (40 mg total) by mouth daily before breakfast.   FLUOXETINE (PROZAC) 40 MG CAPSULE    Take 40 mg by mouth daily.   METOPROLOL TARTRATE (LOPRESSOR) 25 MG TABLET    Take 1 tablet (25 mg total) by mouth 2 (two) times daily.   MULTIPLE VITAMIN (MULTIVITAMIN) TABLET    Take 1 tablet by mouth daily.   NAPROXEN SODIUM 220 MG CAPS    Take 2 capsules by mouth daily.  Modified Medications   No medications on file  Discontinued Medications   FLUOXETINE (PROZAC) 40 MG CAPSULE    TAKE TWO CAPSULES BY MOUTH DAILY

## 2015-05-10 ENCOUNTER — Encounter: Payer: Self-pay | Admitting: Family Medicine

## 2015-05-10 DIAGNOSIS — F17200 Nicotine dependence, unspecified, uncomplicated: Secondary | ICD-10-CM | POA: Insufficient documentation

## 2015-05-13 ENCOUNTER — Telehealth: Payer: Self-pay | Admitting: Family Medicine

## 2015-05-13 ENCOUNTER — Telehealth: Payer: Self-pay | Admitting: *Deleted

## 2015-05-13 NOTE — Telephone Encounter (Signed)
Verified with [place] she did have mammogram and chart updated

## 2015-05-13 NOTE — Telephone Encounter (Signed)
Mammogram f/u call: left voicemail requesting pt to call office back. Need to know status of mammogram (if she has had one/ has one scheduled/ or refuses)

## 2015-05-13 NOTE — Telephone Encounter (Signed)
Patient returned Amanda [name]'s call.  Patient had her mammogram done at [facility] in February or March.

## 2015-05-14 ENCOUNTER — Telehealth: Payer: Self-pay

## 2015-05-14 MED ORDER — LEVOFLOXACIN 500 MG PO TABS: 500.0000 mg | ORAL_TABLET | Freq: Every day | ORAL | Status: DC

## 2015-05-14 MED ORDER — PREDNISONE 20 MG PO TABS: ORAL_TABLET | ORAL | Status: DC

## 2015-05-14 NOTE — Telephone Encounter (Signed)
i want to change ABX  Levaquin 500 mg, 1 po daily x 10 days  Extend predisone. Starting today, increase to 40 mg.  Prednisone 20 mg, 2 tabs po x 4 days, #8. Then finish the 4 pills she has left at 1 pill a day  If not improving next week, I want to see her

## 2015-05-14 NOTE — Telephone Encounter (Signed)
Left message for Amanda [name] to return my call.

## 2015-05-14 NOTE — Telephone Encounter (Signed)
Pt left v/m pt was seen on 05/08/15; on 05/13/15 pt restarted with cough and soreness in rt upper chest and back when coughs or takes deep breath; slight wheezing and SOB also; no fever. Pt finished abx on 05/12/15 and has 4 more days of prednisone. Pt wants to know if should be rechecked or what to do.Please advise.[facility] [address]

## 2015-05-14 NOTE — Telephone Encounter (Signed)
Bette notified as instructed by telephone.  Prescriptions sent to [place].

## 2015-05-28 ENCOUNTER — Telehealth: Payer: Self-pay

## 2015-05-28 NOTE — Telephone Encounter (Signed)
Pt called, states she is not feeling well. States her BP is 114/62 HR 74. States it started late yesterday afternoon, has a "funny feeling in my head". States she also has had some tightness in her chest, no pain,states when she started feeling this way yesterday her HR was 106. Took her 2nd dose of Metoprolol and that seemed to calm it down. States sometimes it is hard to breathe. Also feels tired. Please call.

## 2015-05-28 NOTE — Telephone Encounter (Signed)
Spoke w/ pt.  She reports that she had a busy day yesterday and has felt bad since.  Reports that her head felt fuzzy, she has been feeling sleepy, and has SOB. Reports that her BP & BG have been good, but her pulse was 106 yesterday, so she took her 2nd dose of metoprolol. Reports that she is still feeling strange, but her HR is now 51.  On discussion, pt reports that she is getting over pneumonia and is on prednisone for her knee.  She has not called her PCP, but is sched for a f/u w/ him soon.

## 2015-06-08 ENCOUNTER — Other Ambulatory Visit: Payer: Self-pay | Admitting: Family Medicine

## 2015-06-12 ENCOUNTER — Ambulatory Visit: Payer: Medicare Other | Admitting: Family Medicine

## 2015-06-13 ENCOUNTER — Encounter: Payer: Self-pay | Admitting: Family Medicine

## 2015-06-13 ENCOUNTER — Ambulatory Visit (INDEPENDENT_AMBULATORY_CARE_PROVIDER_SITE_OTHER)
Admission: RE | Admit: 2015-06-13 | Discharge: 2015-06-13 | Disposition: A | Discharge status: Home / Self Care | Payer: Medicare Other | Source: Ambulatory Visit | Attending: Family Medicine | Admitting: Family Medicine

## 2015-06-13 ENCOUNTER — Ambulatory Visit (INDEPENDENT_AMBULATORY_CARE_PROVIDER_SITE_OTHER): Payer: Medicare Other | Admitting: Family Medicine

## 2015-06-13 VITALS — BP 120/60 | HR 79 | Temp 97.7°F | Ht 66.25 in | Wt 208.0 lb

## 2015-06-13 DIAGNOSIS — M25561 Pain in right knee: Secondary | ICD-10-CM

## 2015-06-13 DIAGNOSIS — M25562 Pain in left knee: Secondary | ICD-10-CM

## 2015-06-13 DIAGNOSIS — Z720 Tobacco use: Secondary | ICD-10-CM | POA: Diagnosis not present

## 2015-06-13 DIAGNOSIS — F439 Reaction to severe stress, unspecified: Secondary | ICD-10-CM

## 2015-06-13 DIAGNOSIS — F17200 Nicotine dependence, unspecified, uncomplicated: Secondary | ICD-10-CM

## 2015-06-13 DIAGNOSIS — Z638 Other specified problems related to primary support group: Secondary | ICD-10-CM

## 2015-06-13 DIAGNOSIS — M170 Bilateral primary osteoarthritis of knee: Secondary | ICD-10-CM

## 2015-06-13 MED ORDER — METHYLPREDNISOLONE ACETATE 40 MG/ML IJ SUSP
80.0000 mg | Freq: Once | INTRAMUSCULAR | Status: AC
  Administered 2015-06-13: 80 mg via INTRA_ARTICULAR

## 2015-06-13 NOTE — Progress Notes (Signed)
Dr. Frederico Hamman T. Mahati Vajda, MD, [facility] [place], [address] Phone: [phone] Fax: [phone]  06/13/2015  Patient: Amanda [name], MRN: [id number], DOB: August 25, 1951, 64 y.o.  Primary Physician:  Owens Loffler, MD  Chief Complaint: Follow-up; Knee Pain; and Fall  Subjective:   Amanda [name] is a 64 y.o. very pleasant female patient who presents with the following:  F/u L knee: still coughing some. Wheezing some with breathing. Still smoking 1 a day pack.  Getting short of breath.   She fell on her knees about 6 weeks ago and she is continuing to have some left-sided knee pain.  She is still having an effusion, but is improved compared to last time when I saw her.  She has significant loss of motion at baseline in both knees.  Left is worse than the right.  No history of traumatic fracture that is known and no history of operative intervention in either knee.  She has been a longtime smoker and she is not currently interested in quitting.  Have not examined eyes in a long time. Getting some headaches.  L > R knee pain  L knee injection.  06/08/2015 Last OV with Owens Loffler, MD  Congestion and ears are blocked up for 2 weeks and felt bad and was coughing a lot. Took some delsym and that helped a little. Also took some Benadryl, then some robitussin. Coughing is gone. Pain in back. Some pain in the chest at the same level.   Smokes now - < 1 ppd.  R ML PNA.   L knee has been hurting. Had a bad fall at [facility] and pain in his left knee and her lower back. Xrays were no broken bones. Problems since them. Intermittent swelling.   Past Medical History, Surgical History, Social History, Family History, Problem List, Medications, and Allergies have been reviewed and updated if relevant.  GEN: as above MSK: Detailed in the HPI GI: tolerating PO intake without difficulty Neuro: No numbness, parasthesias, or  tingling associated. Otherwise the pertinent positives of the ROS are noted above.   Objective:   BP 120/60 mmHg  Pulse 79  Temp(Src) 97.7 F (36.5 C) (Oral)  Ht 5' 6.25" (1.683 m)  Wt 208 lb (94.348 kg)  BMI 33.31 kg/m2  SpO2 98%   GEN: A and O x 3. WDWN. NAD.    ENT: Nose clear, ext NML.  No LAD.  No JVD.  TM's clear. Oropharynx clear.  PULM: Normal WOB, no distress. Crackles in the RML. No wheezing.  CV: RRR, no M/G/R, No rubs, No JVD.   EXT: warm and well-perfused, No c/c/e. PSYCH: Pleasant and conversant.   Knee:  L Gait: Normal heel toe pattern ROM: lack of 2 deg ext, flexion to 105 Effusion: minimal Echymosis or edema: none Patellar tendon NT Painful PLICA: neg Patellar grind: negative Medial and lateral patellar facet loading: negative medial and lateral joint lines: medial joint line tender PES bursa minimal TTP Mcmurray's pain Flexion-pinch pain Varus and valgus stress: stable Lachman: neg Ant and Post drawer: neg Hip abduction, IR, ER: WNL Hip flexion str: 5/5 Hip abd: 5/5 Quad: 5/5 VMO atrophy:No Hamstring concentric and eccentric: 5/5   Radiology: Dg Knee Bilateral Standing Ap  06/13/2015   CLINICAL DATA:  Bilateral knee pain for multiple years. No known injury.  EXAM: BILATERAL KNEES STANDING - 1 VIEW  COMPARISON:  None.  FINDINGS: The frontal standing view of both  knees demonstrates moderate to marked medial joint space narrowing on the left and mild to moderate medial joint space narrowing on the right. There is also mild medial and lateral spur formation on the left and mild lateral spur formation on the right.  IMPRESSION: Bilateral degenerative changes.   Electronically Signed   By: Claudie Revering M.D.   On: 06/13/2015 15:44    The radiological images were independently reviewed by myself in the office and results were reviewed with the patient. My independent interpretation of images:  Bilateral joint space narrowing significantly, worse on the left.   Moderate to severe changes on the left are noted, worse in the medial compartment.  On the right knee, there is more of a mild change.  Osteophytosis and flattening of the medial femoral, femoral condyle are noted more prominently on the left. Electronically Signed  By: Owens Loffler, MD   Assessment and Plan:   Primary osteoarthritis of both knees  Knee pain, bilateral - Plan: DG Knee Bilateral Standing AP  Left knee pain - Plan: methylPREDNISolone acetate (DEPO-MEDROL) injection 80 mg  Smoker  Stress at home  Significant left greater than right knee arthritis.  Certainly this is contributing, and likely she has a osteoarthritis exacerbation, worsened by her recent fall.  We are going to inject her left knee and see if this can calm it down some.  We spent additional time talking about her husband and some of the stresses that are going on at home and in their family.  25 minutes were spent in total face-to-face with the patient, greater than 50% of this was spent in discussion and counseling.  We also talked about getting her for some inhalers.  She likely does have some COPD.  Right now she does not have any insurance coverage for her medication, so when she gets this sorted out with her family, we can readdress this in the future.  Knee Injection, LEFT Patient verbally consented to procedure. Risks (including potential rare risk of infection), benefits, and alternatives explained. Sterilely prepped with Chloraprep. Ethyl cholride used for anesthesia. 8 cc Lidocaine 1% mixed with Depo-Medrol 80 mg injected using the anteromedial approach without difficulty. No complications with procedure and tolerated well. Patient had decreased pain post-injection.   New Prescriptions   No medications on file   Orders Placed This Encounter  Procedures  . DG Knee Bilateral Standing AP    Signed,  Dorothie Wah T. Don Giarrusso, MD   Patient's Medications  New Prescriptions   No medications on file    Previous Medications   AMITRIPTYLINE (ELAVIL) 50 MG TABLET    TAKE ONE TABLET BY MOUTH AT BEDTIME   ASCORBIC ACID (VITAMIN C) 1000 MG TABLET    Take 1,000 mg by mouth daily.   ASPIRIN 81 MG TABLET    Take 81 mg by mouth daily.   ATORVASTATIN (LIPITOR) 40 MG TABLET    TAKE ONE TABLET BY MOUTH ONCE DAILY   CHOLECALCIFEROL (VITAMIN D) 1000 UNITS CAPSULE    Take 2,000 Units by mouth daily.    CLONAZEPAM (KLONOPIN) 0.5 MG TABLET    TAKE ONE TABLET BY MOUTH TWICE DAILY AS NEEDED    CYCLOBENZAPRINE (FLEXERIL) 10 MG TABLET    TAKE ONE TABLET BY MKOUTH THREE TIMES A DAY AS NEEDED FOR MUSCLE SPASMS    DIPHENHYDRAMINE (BENADRYL) 25 MG CAPSULE    Take 25 mg by mouth 2 (two) times daily.   ESOMEPRAZOLE (NEXIUM) 40 MG CAPSULE    Take 1 capsule (  40 mg total) by mouth daily before breakfast.   FLUOXETINE (PROZAC) 40 MG CAPSULE    Take 40 mg by mouth daily.   METOPROLOL TARTRATE (LOPRESSOR) 25 MG TABLET    Take 1 tablet (25 mg total) by mouth 2 (two) times daily.   MULTIPLE VITAMIN (MULTIVITAMIN) TABLET    Take 1 tablet by mouth daily.   NAPROXEN SODIUM 220 MG CAPS    Take 2 capsules by mouth daily.  Modified Medications   No medications on file  Discontinued Medications   AZITHROMYCIN (ZITHROMAX) 250 MG TABLET    2 tabs po on day 1, then 1 tab po for 4 days   LEVOFLOXACIN (LEVAQUIN) 500 MG TABLET    Take 1 tablet (500 mg total) by mouth daily.   PREDNISONE (DELTASONE) 20 MG TABLET    2 tabs po for 4 days, then 1 tab po for 4 days

## 2015-06-13 NOTE — Progress Notes (Signed)
Pre visit review using our clinic review tool, if applicable. No additional management support is needed unless otherwise documented below in the visit note. 

## 2015-06-16 ENCOUNTER — Encounter: Payer: Self-pay | Admitting: Family Medicine

## 2015-06-16 DIAGNOSIS — M170 Bilateral primary osteoarthritis of knee: Secondary | ICD-10-CM

## 2015-06-16 DIAGNOSIS — M1711 Unilateral primary osteoarthritis, right knee: Secondary | ICD-10-CM | POA: Insufficient documentation

## 2015-06-16 HISTORY — DX: Bilateral primary osteoarthritis of knee: M17.0

## 2015-07-12 ENCOUNTER — Other Ambulatory Visit: Payer: Self-pay | Admitting: Family Medicine

## 2015-07-12 ENCOUNTER — Other Ambulatory Visit: Payer: Self-pay | Admitting: Cardiovascular Disease

## 2015-07-13 NOTE — Telephone Encounter (Signed)
Last office visit 06/13/2015.  Last Lipid Panel 05/03/2014.  Ok to refill?

## 2015-07-18 ENCOUNTER — Ambulatory Visit (INDEPENDENT_AMBULATORY_CARE_PROVIDER_SITE_OTHER): Payer: Medicare Other | Admitting: Cardiovascular Disease

## 2015-07-18 ENCOUNTER — Encounter: Payer: Self-pay | Admitting: Cardiovascular Disease

## 2015-07-18 VITALS — BP 120/62 | HR 82 | Ht 69.0 in | Wt 206.0 lb

## 2015-07-18 DIAGNOSIS — R002 Palpitations: Secondary | ICD-10-CM | POA: Diagnosis not present

## 2015-07-18 DIAGNOSIS — R0602 Shortness of breath: Secondary | ICD-10-CM

## 2015-07-18 DIAGNOSIS — R079 Chest pain, unspecified: Secondary | ICD-10-CM

## 2015-07-18 DIAGNOSIS — I10 Essential (primary) hypertension: Secondary | ICD-10-CM

## 2015-07-18 DIAGNOSIS — Z720 Tobacco use: Secondary | ICD-10-CM

## 2015-07-18 DIAGNOSIS — F17200 Nicotine dependence, unspecified, uncomplicated: Secondary | ICD-10-CM

## 2015-07-18 NOTE — Patient Instructions (Addendum)
Medication Instructions: Continue same medications.   Labwork: None.   Procedures/Testing: None.   Follow-Up: 1 year with Dr. Jurline Folger  Any Additional Special Instructions Will Be Listed Below (If Applicable).   

## 2015-07-18 NOTE — Assessment & Plan Note (Signed)
Resolved with metoprolol

## 2015-07-18 NOTE — Assessment & Plan Note (Signed)
Blood pressure is well controlled on current medications. 

## 2015-07-18 NOTE — Progress Notes (Signed)
HPI  This is a 64 year old female who is here today for a followup visit. She was seen in 2013 for atypical chest pain and palpitations. Some of her symptoms were felt to be due to anxiety.  She underwent a treadmill stress test which showed no evidence of ischemia. She had an echocardiogram done which showed normal LV systolic function, mild mitral regurgitation and no evidence of pulmonary hypertension. A 48 hour Holter monitor showed intermittent sinus tachycardia with occasional PACs but no other significant arrhythmias. Her palpitations significantly after adding Metoprolol.  She has been doing reasonably well and denies chest pain or worsening shortness of breath. She continues to smoke.     Allergies  Allergen Reactions  . Diazepam     REACTION: unspecified     Current Outpatient Prescriptions on File Prior to Visit  Medication Sig Dispense Refill  . amitriptyline (ELAVIL) 50 MG tablet TAKE ONE TABLET BY MOUTH AT BEDTIME 30 tablet 5  . Ascorbic Acid (VITAMIN C) 1000 MG tablet Take 1,000 mg by mouth daily.    Marland Kitchen aspirin 81 MG tablet Take 81 mg by mouth daily.    Marland Kitchen atorvastatin (LIPITOR) 40 MG tablet TAKE ONE TABLET BY MOUTH ONCE DAILY 30 tablet 5  . Cholecalciferol (VITAMIN D) 1000 UNITS capsule Take 2,000 Units by mouth daily.     . clonazePAM (KLONOPIN) 0.5 MG tablet TAKE ONE TABLET BY MOUTH TWICE DAILY AS NEEDED  30 tablet 3  . cyclobenzaprine (FLEXERIL) 10 MG tablet TAKE ONE TABLET BY MKOUTH THREE TIMES A DAY AS NEEDED FOR MUSCLE SPASMS  50 tablet 5  . diphenhydrAMINE (BENADRYL) 25 mg capsule Take 25 mg by mouth 2 (two) times daily.    Marland Kitchen esomeprazole (NEXIUM) 40 MG capsule Take 1 capsule (40 mg total) by mouth daily before breakfast. 90 capsule 3  . FLUoxetine (PROZAC) 40 MG capsule Take 40 mg by mouth daily.    . metoprolol tartrate (LOPRESSOR) 25 MG tablet TAKE ONE TABLET BY MOUTH TWICE DAILY 60 tablet 3  . Multiple Vitamin (MULTIVITAMIN) tablet Take 1 tablet by mouth daily.     . Naproxen Sodium 220 MG CAPS Take 2 capsules by mouth daily.     No current facility-administered medications on file prior to visit.     Past Medical History  Diagnosis Date  . Depression   . Anxiety   . Hyperlipidemia   . GERD (gastroesophageal reflux disease)   . Tobacco abuse   . Hypertension   . Smoker 05/10/2015  . Degenerative arthritis of knee, bilateral 06/16/2015     Past Surgical History  Procedure Laterality Date  . Tonsillectomy      4 yoa  . Ovarian cyst removal  1972  . Abdominal hysterectomy  1993  . Fracture surgery  06/2002    right leg  . Wrist fracture surgery  11/2008     Family History  Problem Relation Age of Onset  . Hyperlipidemia Mother   . Arthritis Mother   . Diabetes Mother   . Cancer Paternal Grandmother     breast     Social History   Social History  . Marital Status: Married    Spouse Name: N/A  . Number of Children: 2  . Years of Education: N/A   Occupational History  . [place] History Main Topics  . Smoking status: Current Every Day Smoker -- 1.00 packs/day    Types: Cigarettes  . Smokeless tobacco: Never Used  .  Alcohol Use: Yes     Comment: occ glass of wine  . Drug Use: No  . Sexual Activity: Not on file   Other Topics Concern  . Not on file   Social History Narrative     PHYSICAL EXAM   BP 120/62 mmHg  Ht 5\' 9"  (1.753 m)  Wt 206 lb (93.441 kg)  BMI 30.41 kg/m2  Constitutional: She is oriented to person, place, and time. She appears well-developed and well-nourished. No distress.  HENT: No nasal discharge.  Head: Normocephalic and atraumatic.  Eyes: Pupils are equal and round. Right eye exhibits no discharge. Left eye exhibits no discharge.  Neck: Normal range of motion. Neck supple. No JVD present. No thyromegaly present.  Cardiovascular: Normal rate, regular rhythm, normal heart sounds. Exam reveals no gallop and no friction rub. There is a 2/6 systolic ejection  murmur at the aortic area which is mid peaking.  Pulmonary/Chest: Effort normal and breath sounds normal. No stridor. No respiratory distress. She has no wheezes. She has no rales. She exhibits no tenderness.  Abdominal: Soft. Bowel sounds are normal. She exhibits no distension. There is no tenderness. There is no rebound and no guarding.  Musculoskeletal: Normal range of motion. She exhibits no edema and no tenderness.  Neurological: She is alert and oriented to person, place, and time. Coordination normal.  Skin: Skin is warm and dry. No rash noted. She is not diaphoretic. No erythema. No pallor.  Psychiatric: She has a normal mood and affect. Her behavior is normal. Judgment and thought content normal.   EKG: NSR. No significant ST or T wave changes.  ASSESSMENT AND PLAN

## 2015-07-18 NOTE — Assessment & Plan Note (Signed)
I discussed with her the importance of smoking cessation. She has not made up her mind to quit yet.

## 2015-07-22 ENCOUNTER — Ambulatory Visit (INDEPENDENT_AMBULATORY_CARE_PROVIDER_SITE_OTHER): Payer: Medicare Other | Admitting: Family Medicine

## 2015-07-22 ENCOUNTER — Encounter: Payer: Self-pay | Admitting: Family Medicine

## 2015-07-22 ENCOUNTER — Ambulatory Visit (INDEPENDENT_AMBULATORY_CARE_PROVIDER_SITE_OTHER)
Admission: RE | Admit: 2015-07-22 | Discharge: 2015-07-22 | Disposition: A | Discharge status: Home / Self Care | Payer: Medicare Other | Source: Ambulatory Visit | Attending: Family Medicine | Admitting: Family Medicine

## 2015-07-22 VITALS — BP 106/64 | HR 82 | Temp 97.8°F | Ht 66.25 in | Wt 209.0 lb

## 2015-07-22 DIAGNOSIS — M25511 Pain in right shoulder: Secondary | ICD-10-CM

## 2015-07-22 DIAGNOSIS — S4991XA Unspecified injury of right shoulder and upper arm, initial encounter: Secondary | ICD-10-CM | POA: Diagnosis not present

## 2015-07-22 NOTE — Progress Notes (Signed)
Pre visit review using our clinic review tool, if applicable. No additional management support is needed unless otherwise documented below in the visit note. 

## 2015-07-22 NOTE — Progress Notes (Signed)
Dr. Frederico Hamman T. Aylla Huffine, MD, [facility] Primary [facility] and Sports Medicine [place], [address] Phone: [phone] Fax: [phone]  07/22/2015  Patient: Amanda [name], MRN: [id number], DOB: 04/14/1951, 64 y.o.  Primary Physician:  Owens Loffler, MD  Chief Complaint: Shoulder Pain  Subjective:   Amanda [name] is a 64 y.o. very pleasant female patient who presents with the following:  R shoulder, has been bothering her for a while. Historically, she was having some impingement symptoms.  A week ago last Friday, R shoulder was bothering her and an apple tree limb was hurting really bad. She hit this while on the lawnmower and now with a lot of difficulty abducting.   A little better compared to Friday.   Past Medical History, Surgical History, Social History, Family History, Problem List, Medications, and Allergies have been reviewed and updated if relevant.  Patient Active Problem List   Diagnosis Date Noted  . Palpitations 07/18/2015  . Osteoarthritis Knees, bilateral, moderate 06/16/2015  . Smoker 05/10/2015  . Solitary pulmonary nodule 03/28/2013  . Cardiac murmur 03/15/2012  . Hypertension   . UNSPECIFIED VITAMIN D DEFICIENCY 05/29/2009  . Pure hypercholesterolemia 12/07/2007  . DEPRESSIVE DISORDER, RCR, SEVERE 12/07/2007  . AODM 08/10/2007    Past Medical History  Diagnosis Date  . Depression   . Anxiety   . Hyperlipidemia   . GERD (gastroesophageal reflux disease)   . Tobacco abuse   . Hypertension   . Smoker 05/10/2015  . Degenerative arthritis of knee, bilateral 06/16/2015    Past Surgical History  Procedure Laterality Date  . Tonsillectomy      4 yoa  . Ovarian cyst removal  1972  . Abdominal hysterectomy  1993  . Fracture surgery  06/2002    right leg  . Wrist fracture surgery  11/2008    Social History   Social History  . Marital Status: Married    Spouse Name: N/A  . Number of Children: 2  . Years of  Education: N/A   Occupational History  . [place] History Main Topics  . Smoking status: Current Every Day Smoker -- 1.00 packs/day    Types: Cigarettes  . Smokeless tobacco: Never Used  . Alcohol Use: Yes     Comment: occ glass of wine  . Drug Use: No  . Sexual Activity: Not on file   Other Topics Concern  . Not on file   Social History Narrative    Family History  Problem Relation Age of Onset  . Hyperlipidemia Mother   . Arthritis Mother   . Diabetes Mother   . Cancer Paternal Grandmother     breast    Allergies  Allergen Reactions  . Diazepam     REACTION: unspecified    Medication list reviewed and updated in full in [place].  GEN: No fevers, chills. Nontoxic. Primarily MSK c/o today. MSK: Detailed in the HPI GI: tolerating PO intake without difficulty Neuro: No numbness, parasthesias, or tingling associated. Otherwise the pertinent positives of the ROS are noted above.   Objective:   BP 106/64 mmHg  Pulse 82  Temp(Src) 97.8 F (36.6 C) (Oral)  Ht 5' 6.25" (1.683 m)  Wt 209 lb (94.802 kg)  BMI 33.47 kg/m2   GEN: WDWN, NAD, Non-toxic, Alert & Oriented x 3 HEENT: Atraumatic, Normocephalic.  Ears and Nose: No external deformity. EXTR: No clubbing/cyanosis/edema NEURO: Normal gait.  PSYCH: Normally interactive. Conversant. Not  depressed or anxious appearing.  Calm demeanor.    Right shoulder: Mildly tender along the clavicle and mildly tender at the a.c. Joint.  Highly tender in the bicipital groove.  Tender in the location of the supraspinatus insertion.  Otherwise the humerus is nontender.  Abduction is limited actively to approximately 135.  Strength is 3+/5 Flexion is also limited to 135 actively.  Strength is 4/5 Internal/external rotation, strength is 5/5.  Some limitation with motion as well.  Drop test is negative. Other test is equivocal given pain acutely and loss of motion  Radiology: Dg  Shoulder Right  07/23/2015   CLINICAL DATA:  Direct right shoulder trauma by a falling tree limb.  EXAM: RIGHT SHOULDER - 2+ VIEW  COMPARISON:  None in PACs  FINDINGS: The bones of the shoulder are adequately mineralized. There is some irregularity of the greater tuberosity of the humerus. There is no Bankart lesion of the glenoid. The glenohumeral and AC joints are grossly normal. The observed portions of the right clavicle and upper right ribs are normal.  IMPRESSION: There is no acute bony abnormality of the right shoulder. Mild degenerative change associated with the greater tuberosity is suspected. If the patient's symptoms warrant further evaluation, MRI would be a useful next imaging modality.   Electronically Signed   By: David  Martinique M.D.   On: 07/23/2015 08:10     Assessment and Plan:   Right shoulder pain - Plan: DG Shoulder Right  No fracture.  Fairly intense direct trauma to the shoulder.  Hopefully this is all just significant bone contusion and soft tissue contusion.  Certainly, she could have torn her rotator cuff as well.  We will reassess this in one month.  Moon protocol.  Follow-up: Return in about 1 month (around 08/22/2015).  New Prescriptions   No medications on file   Orders Placed This Encounter  Procedures  . DG Shoulder Right    Signed,  Frederico Hamman T. Yarima Penman, MD   Patient's Medications  New Prescriptions   No medications on file  Previous Medications   AMITRIPTYLINE (ELAVIL) 50 MG TABLET    TAKE ONE TABLET BY MOUTH AT BEDTIME   ASCORBIC ACID (VITAMIN C) 1000 MG TABLET    Take 1,000 mg by mouth daily.   ASPIRIN 81 MG TABLET    Take 81 mg by mouth daily.   ATORVASTATIN (LIPITOR) 40 MG TABLET    TAKE ONE TABLET BY MOUTH ONCE DAILY   CHOLECALCIFEROL (VITAMIN D) 1000 UNITS CAPSULE    Take 2,000 Units by mouth daily.    CLONAZEPAM (KLONOPIN) 0.5 MG TABLET    TAKE ONE TABLET BY MOUTH TWICE DAILY AS NEEDED    CYCLOBENZAPRINE (FLEXERIL) 10 MG TABLET    TAKE ONE  TABLET BY MKOUTH THREE TIMES A DAY AS NEEDED FOR MUSCLE SPASMS    DIPHENHYDRAMINE (BENADRYL) 25 MG CAPSULE    Take 25 mg by mouth 2 (two) times daily.   ESOMEPRAZOLE (NEXIUM) 40 MG CAPSULE    Take 1 capsule (40 mg total) by mouth daily before breakfast.   FLUOXETINE (PROZAC) 40 MG CAPSULE    Take 40 mg by mouth daily.   MAGNESIUM OXIDE (MAG-OX) 400 MG TABLET    Take 400 mg by mouth daily.   METOPROLOL TARTRATE (LOPRESSOR) 25 MG TABLET    TAKE ONE TABLET BY MOUTH TWICE DAILY   MULTIPLE VITAMIN (MULTIVITAMIN) TABLET    Take 1 tablet by mouth daily.   NAPROXEN SODIUM 220 MG CAPS  Take 2 capsules by mouth daily.  Modified Medications   No medications on file  Discontinued Medications   No medications on file

## 2015-09-18 ENCOUNTER — Encounter: Payer: Self-pay | Admitting: Gastroenterology

## 2015-09-18 ENCOUNTER — Ambulatory Visit (INDEPENDENT_AMBULATORY_CARE_PROVIDER_SITE_OTHER): Payer: Medicare Other | Admitting: Family Medicine

## 2015-09-18 ENCOUNTER — Encounter: Payer: Self-pay | Admitting: Family Medicine

## 2015-09-18 VITALS — BP 120/64 | HR 87 | Temp 97.7°F | Ht 69.0 in | Wt 209.2 lb

## 2015-09-18 DIAGNOSIS — Z23 Encounter for immunization: Secondary | ICD-10-CM | POA: Diagnosis not present

## 2015-09-18 DIAGNOSIS — Z1211 Encounter for screening for malignant neoplasm of colon: Secondary | ICD-10-CM

## 2015-09-18 DIAGNOSIS — H539 Unspecified visual disturbance: Secondary | ICD-10-CM | POA: Diagnosis not present

## 2015-09-18 DIAGNOSIS — R413 Other amnesia: Secondary | ICD-10-CM

## 2015-09-18 DIAGNOSIS — E1110 Type 2 diabetes mellitus with ketoacidosis without coma: Secondary | ICD-10-CM

## 2015-09-18 DIAGNOSIS — G4452 New daily persistent headache (NDPH): Secondary | ICD-10-CM | POA: Diagnosis not present

## 2015-09-18 DIAGNOSIS — E1310 Other specified diabetes mellitus with ketoacidosis without coma: Secondary | ICD-10-CM | POA: Diagnosis not present

## 2015-09-18 DIAGNOSIS — M170 Bilateral primary osteoarthritis of knee: Secondary | ICD-10-CM

## 2015-09-18 DIAGNOSIS — K5900 Constipation, unspecified: Secondary | ICD-10-CM

## 2015-09-18 LAB — BASIC METABOLIC PANEL
BUN: 11 mg/dL (ref 6–23)
CO2: 29 mEq/L (ref 19–32)
Calcium: 9.5 mg/dL (ref 8.4–10.5)
Chloride: 105 mEq/L (ref 96–112)
Creatinine, Ser: 0.71 mg/dL (ref 0.40–1.20)
GFR: 87.72 mL/min (ref >60.00)
Glucose, Bld: 95 mg/dL (ref 70–99)
Potassium: 4.2 mEq/L (ref 3.5–5.1)
Sodium: 140 mEq/L (ref 135–145)

## 2015-09-18 LAB — CBC WITH DIFFERENTIAL/PLATELET
Basophils Absolute: 0 10*3/uL (ref 0.0–0.1)
Basophils Relative: 0.5 % (ref 0.0–3.0)
Eosinophils Absolute: 0.2 10*3/uL (ref 0.0–0.7)
Eosinophils Relative: 3 % (ref 0.0–5.0)
HCT: 38.5 % (ref 36.0–46.0)
Hemoglobin: 12.8 g/dL (ref 12.0–15.0)
Lymphocytes Relative: 26.6 % (ref 12.0–46.0)
Lymphs Abs: 2 10*3/uL (ref 0.7–4.0)
MCHC: 33.2 g/dL (ref 30.0–36.0)
MCV: 102.9 fl — ABNORMAL HIGH (ref 78.0–100.0)
Monocytes Absolute: 0.5 10*3/uL (ref 0.1–1.0)
Monocytes Relative: 6.1 % (ref 3.0–12.0)
Neutro Abs: 4.8 10*3/uL (ref 1.4–7.7)
Neutrophils Relative %: 63.8 % (ref 43.0–77.0)
Platelets: 222 10*3/uL (ref 150.0–400.0)
RBC: 3.74 Mil/uL — ABNORMAL LOW (ref 3.87–5.11)
RDW: 12.3 % (ref 11.5–15.5)
WBC: 7.5 10*3/uL (ref 4.0–10.5)

## 2015-09-18 LAB — HEPATIC FUNCTION PANEL
ALT: 15 U/L (ref 0–35)
AST: 18 U/L (ref 0–37)
Albumin: 4.1 g/dL (ref 3.5–5.2)
Alkaline Phosphatase: 41 U/L (ref 39–117)
Bilirubin, Direct: 0 mg/dL (ref 0.0–0.3)
Total Bilirubin: 0.4 mg/dL (ref 0.2–1.2)
Total Protein: 6.8 g/dL (ref 6.0–8.3)

## 2015-09-18 LAB — VITAMIN B12: Vitamin B-12: 305 pg/mL (ref 211–911)

## 2015-09-18 LAB — HEMOGLOBIN A1C: Hgb A1c MFr Bld: 5.8 % (ref 4.6–6.5)

## 2015-09-18 LAB — TSH: TSH: 0.62 u[IU]/mL (ref 0.35–4.50)

## 2015-09-18 NOTE — Progress Notes (Signed)
Pre visit review using our clinic review tool, if applicable. No additional management support is needed unless otherwise documented below in the visit note. 

## 2015-09-18 NOTE — Patient Instructions (Addendum)
BEFORE YOUR MRI, TAKE 2 OF YOUR KLONOPIN TABLETS TO HELP WITH CLAUSTROPHOBIA DO NOT DRIVE  CONSTIPATION Difficult, uncomfortable, infrequent BM  1. Warm prune juice, hot water, tea, coffee, apple juice 2. Prevention: drink 8 glasses water daily, FIBER (raw fruit, veggies, bran cereal, whole grains), regular exercise 3. Bulk formers like Metamucil (psyllium), Citrucel (methylcellulose) usually help 4. Docusate 2 tabs TWICE A DAY 5. Occaisional over the counter Miralax usually safe: start with one dose, then increase to 2, 3, 4 etc. Daily until bowel movement.   6. Overuse of stimulant  laxatives (Ex-Lax, Mag Citrate, Dulcolax, etc.) can be habit forming       REFERRALS TO SPECIALISTS, SPECIAL TESTS (MRI, CT, ULTRASOUNDS)  [facility] or [facility] will help you. ASK CHECK-IN FOR HELP.  Imaging / Special Testing referrals sometimes can be done same day if EMERGENCY, but others can take 2 or 3 days to get an appointment. Starting in 2015, many of the new Medicare plans and Obamacare plans take much longer.   Specialist appointment times vary a great deal, based on their schedule / openings. -- Some specialists have very long wait times. (Example. Dermatology. Multiple months  for non-cancer)

## 2015-09-18 NOTE — Progress Notes (Signed)
Dr. Frederico Hamman T. Coral Soler, MD, [facility] [place], [address] Phone: [phone] Fax: [phone]  09/18/2015  Patient: Amanda [name], MRN: [id number], DOB: Mar 21, 1951, 64 y.o.  Primary Physician:  Owens Loffler, MD  Chief Complaint: Knee Pain; Memory issues; Tenderness Left Side of Head; Dizziness; and Constipation  Subjective:   Amanda [name] is a 64 y.o. very pleasant female patient who presents with the following:  Well-known patient presents with multiple issues and multiple complaints today.  "Does not feel good." "Does not feel right" 18 months since the patient's last laboratories. For a time, the patient was without health insurance, so she wanted to limit some of her financial exposure.  Left knee is killing and will bother her almost all the time. Hard to walk on it. It depends on how bad it is bothering her and bothering her almost all the time. No significant osteoarthritic change. Clinically this is significantly bad with flexion limited to approximately 90.  "Whole left side of body is giving her a fit right now."  Multiple other complaints including memory changes, visual changes, photophobia, headaches, blurred vision. Pain to palpation on the left side of her head. This is all been ongoing for weeks. Memory. Forgetting to do simple things like change Joe's pain patch. Spells where her head feels tender, bruised, sore or something wrong. Some days it is worse than others and she will sometimes have some blurred vision.  Will have some photophobia. No h/o migraines.  Blurred vision.  Daughter, sister and mother with migraines.   Trouble with constipation.  Can pee if leaning over.  Taking some fiber   The patient has not had a colonoscopy in about 20 years.  Past Medical History, Surgical History, Social History, Family History, Problem List, Medications, and Allergies have been reviewed and  updated if relevant.  Patient Active Problem List   Diagnosis Date Noted  . Palpitations 07/18/2015  . Osteoarthritis Knees, bilateral, moderate 06/16/2015  . Smoker 05/10/2015  . Solitary pulmonary nodule 03/28/2013  . Cardiac murmur 03/15/2012  . Hypertension   . UNSPECIFIED VITAMIN D DEFICIENCY 05/29/2009  . Pure hypercholesterolemia 12/07/2007  . DEPRESSIVE DISORDER, RCR, SEVERE 12/07/2007  . AODM 08/10/2007    Past Medical History  Diagnosis Date  . Depression   . Anxiety   . Hyperlipidemia   . GERD (gastroesophageal reflux disease)   . Tobacco abuse   . Hypertension   . Smoker 05/10/2015  . Degenerative arthritis of knee, bilateral 06/16/2015    Past Surgical History  Procedure Laterality Date  . Tonsillectomy      4 yoa  . Ovarian cyst removal  1972  . Abdominal hysterectomy  1993  . Fracture surgery  06/2002    right leg  . Wrist fracture surgery  11/2008    Social History   Social History  . Marital Status: Married    Spouse Name: N/A  . Number of Children: 2  . Years of Education: N/A   Occupational History  . [place] History Main Topics  . Smoking status: Current Every Day Smoker -- 1.00 packs/day    Types: Cigarettes  . Smokeless tobacco: Never Used  . Alcohol Use: Yes     Comment: occ glass of wine  . Drug Use: No  . Sexual Activity: Not on file   Other Topics Concern  . Not on file   Social History Narrative  Family History  Problem Relation Age of Onset  . Hyperlipidemia Mother   . Arthritis Mother   . Diabetes Mother   . Cancer Paternal Grandmother     breast    Allergies  Allergen Reactions  . Diazepam     REACTION: unspecified    Medication list reviewed and updated in full in [place].   GEN: No acute illnesses, no fevers, chills. GI: No n/v/d, eating normally Pulm: No SOB Interactive and getting along well at home. No depression or anxiety. Otherwise, the pertinent  positives and negatives are listed above and in the HPI, otherwise a full review of systems has been reviewed and is negative unless noted positive.   Objective:   BP 120/64 mmHg  Pulse 87  Temp(Src) 97.7 F (36.5 C) (Oral)  Ht 5\' 9"  (1.753 m)  Wt 209 lb 4 oz (94.915 kg)  BMI 30.89 kg/m2  GEN: WDWN, NAD, Non-toxic, A & O x 3 HEENT: Atraumatic, Normocephalic. Neck supple. No masses, No LAD. Ears and Nose: No external deformity. CV: RRR, 2/6 SEM. No JVD. No thrill. No extra heart sounds. PULM: CTA B, no wheezes, crackles, rhonchi. No retractions. No resp. distress. No accessory muscle use. EXTR: No c/c/e PSYCH: Normally interactive. Conversant. Not depressed or anxious appearing.  Calm demeanor.   Knee:  L knee Gait: markedly antalgic ROM: loss of 5 deg extension and flexion to 95 degrees Effusion: mild Echymosis or edema: none Patellar tendon NT Painful PLICA: neg Patellar grind: + Medial and lateral patellar facet loading: pain medial and lateral joint lines: medial > lateral pain Mcmurray's + Flexion-pinch + Varus and valgus stress: stable Lachman: neg Ant and Post drawer: neg Hip abduction, IR, ER: WNL Hip flexion str: 5/5 Hip abd: 5/5 Quad: 5/5 VMO atrophy: mild Hamstring concentric and eccentric: 5/5   Neuro: CN 2-12 grossly intact. PERRLA. EOMI. Sensation intact throughout. Str 5/5 all extremities. DTR 2+. No clonus. A and o x 4. Romberg neg. Finger nose neg.   Folstein MMSE 30/30   Laboratory and Imaging Data: All pending  CLINICAL DATA: Bilateral knee pain for multiple years. No known injury.  EXAM: BILATERAL KNEES STANDING - 1 VIEW  COMPARISON: None.  FINDINGS: The frontal standing view of both knees demonstrates moderate to marked medial joint space narrowing on the left and mild to moderate medial joint space narrowing on the right. There is also mild medial and lateral spur formation on the left and mild lateral spur formation on the  right.  IMPRESSION: Bilateral degenerative changes.   Electronically Signed  By: Claudie Revering M.D.  On: 06/13/2015 15:44  Assessment and Plan:   Memory change - Plan: CBC with Differential/Platelet, Hepatic function panel, TSH, Vitamin B12, MR Brain W Wo Contrast  Uncontrolled type 2 diabetes mellitus with ketoacidosis without coma, without long-term current use of insulin (HCC) - Plan: Basic metabolic panel, Hemoglobin A1c  New daily persistent headache - Plan: CBC with Differential/Platelet, Hepatic function panel, TSH, Vitamin B12, MR Brain W Wo Contrast  Visual changes - Plan: CBC with Differential/Platelet, Hepatic function panel, TSH, Vitamin B12, MR Brain W Wo Contrast  Primary osteoarthritis of both knees  Special screening for malignant neoplasms, colon - Plan: Ambulatory referral to Gastroenterology  Need for prophylactic vaccination and inoculation against influenza - Plan: Flu Vaccine QUAD 36+ mos IM  Need for prophylactic vaccination against Streptococcus pneumoniae (pneumococcus) - Plan: Pneumococcal conjugate vaccine 13-valent IM  Constipation, unspecified constipation type  >40 minutes spent in face  to face time with patient, >50% spent in counselling or coordination of care  Multiple concerning issues. Obtain laboratories that the patient is behind on follow-up.  Neurological complaints. New persistent daily headaches. Visual changes. Photophobia. Memory dysfunction by history and some intermittent memory problems and confusion. Obtain MRI of the brain with and without contrast to evaluate for potential neoplasm, demyelinating disease, CVA, migraine activity, or other specific intracranial pathology that could be explaining the patient's ongoing new symptoms.  Significant left knee osteoarthritis. Further workup is indicated, the patient is clinically showing signs of severe osteoarthritic change with minimal range of motion. Limited improvement with  corticosteroid injection previously. At this point all of her other problems are more important in this and this will need to be set aside until definitive diagnosis is achieved with the above.  Reviewed constipation management.  Follow-up: dependent on outcome of advanced imaging.  New Prescriptions   No medications on file   Orders Placed This Encounter  Procedures  . MR Brain W Wo Contrast  . Flu Vaccine QUAD 36+ mos IM  . Pneumococcal conjugate vaccine 13-valent IM  . Basic metabolic panel  . CBC with Differential/Platelet  . Hepatic function panel  . Hemoglobin A1c  . TSH  . Vitamin B12  . Ambulatory referral to Gastroenterology    Signed,  Frederico Hamman T. Dejae Bernet, MD   Patient's Medications  New Prescriptions   No medications on file  Previous Medications   AMITRIPTYLINE (ELAVIL) 50 MG TABLET    TAKE ONE TABLET BY MOUTH AT BEDTIME   ASCORBIC ACID (VITAMIN C) 1000 MG TABLET    Take 1,000 mg by mouth daily.   ASPIRIN 81 MG TABLET    Take 81 mg by mouth daily.   ATORVASTATIN (LIPITOR) 40 MG TABLET    TAKE ONE TABLET BY MOUTH ONCE DAILY   CHOLECALCIFEROL (VITAMIN D) 1000 UNITS CAPSULE    Take 2,000 Units by mouth daily.    CLONAZEPAM (KLONOPIN) 0.5 MG TABLET    TAKE ONE TABLET BY MOUTH TWICE DAILY AS NEEDED    CYCLOBENZAPRINE (FLEXERIL) 10 MG TABLET    TAKE ONE TABLET BY MKOUTH THREE TIMES A DAY AS NEEDED FOR MUSCLE SPASMS    DIPHENHYDRAMINE (BENADRYL) 25 MG CAPSULE    Take 25 mg by mouth 2 (two) times daily.   DOCUSATE CALCIUM (STOOL SOFTENER PO)    Take 1 tablet by mouth as needed.   ESOMEPRAZOLE (NEXIUM) 40 MG CAPSULE    Take 1 capsule (40 mg total) by mouth daily before breakfast.   FLUOXETINE (PROZAC) 40 MG CAPSULE    Take 40 mg by mouth daily.   MAGNESIUM OXIDE (MAG-OX) 400 MG TABLET    Take 400 mg by mouth daily.   METHYLCELLULOSE, LAXATIVE, (FIBER THERAPY PO)    Take by mouth as needed.   METOPROLOL TARTRATE (LOPRESSOR) 25 MG TABLET    TAKE ONE TABLET BY MOUTH TWICE  DAILY   MULTIPLE VITAMIN (MULTIVITAMIN) TABLET    Take 1 tablet by mouth daily.   NAPROXEN SODIUM 220 MG CAPS    Take 2 capsules by mouth daily.  Modified Medications   No medications on file  Discontinued Medications   No medications on file

## 2015-09-20 ENCOUNTER — Ambulatory Visit (HOSPITAL_COMMUNITY)
Admission: RE | Admit: 2015-09-20 | Discharge: 2015-09-20 | Disposition: A | Discharge status: Home / Self Care | Payer: Medicare Other | Source: Ambulatory Visit | Attending: Family Medicine | Admitting: Family Medicine

## 2015-09-20 DIAGNOSIS — R413 Other amnesia: Secondary | ICD-10-CM | POA: Diagnosis not present

## 2015-09-20 DIAGNOSIS — H539 Unspecified visual disturbance: Secondary | ICD-10-CM | POA: Insufficient documentation

## 2015-09-20 DIAGNOSIS — G4452 New daily persistent headache (NDPH): Secondary | ICD-10-CM

## 2015-09-20 MED ORDER — GADOBENATE DIMEGLUMINE 529 MG/ML IV SOLN
20.0000 mL | Freq: Once | INTRAVENOUS | Status: AC | PRN
  Administered 2015-09-20: 19 mL via INTRAVENOUS

## 2015-09-23 ENCOUNTER — Other Ambulatory Visit: Payer: Self-pay | Admitting: Family Medicine

## 2015-09-23 DIAGNOSIS — G4452 New daily persistent headache (NDPH): Secondary | ICD-10-CM

## 2015-09-23 DIAGNOSIS — R413 Other amnesia: Secondary | ICD-10-CM

## 2015-09-23 DIAGNOSIS — H539 Unspecified visual disturbance: Secondary | ICD-10-CM

## 2015-10-10 ENCOUNTER — Other Ambulatory Visit: Payer: Self-pay | Admitting: Family Medicine

## 2015-10-21 ENCOUNTER — Ambulatory Visit (INDEPENDENT_AMBULATORY_CARE_PROVIDER_SITE_OTHER): Payer: Medicare Other | Admitting: Family Medicine

## 2015-10-21 ENCOUNTER — Ambulatory Visit (INDEPENDENT_AMBULATORY_CARE_PROVIDER_SITE_OTHER)
Admission: RE | Admit: 2015-10-21 | Discharge: 2015-10-21 | Disposition: A | Discharge status: Home / Self Care | Payer: Medicare Other | Source: Ambulatory Visit | Attending: Family Medicine | Admitting: Family Medicine

## 2015-10-21 ENCOUNTER — Encounter: Payer: Self-pay | Admitting: Family Medicine

## 2015-10-21 VITALS — BP 110/60 | HR 78 | Temp 98.3°F | Ht 69.0 in | Wt 203.8 lb

## 2015-10-21 DIAGNOSIS — M179 Osteoarthritis of knee, unspecified: Secondary | ICD-10-CM | POA: Diagnosis not present

## 2015-10-21 DIAGNOSIS — M25562 Pain in left knee: Secondary | ICD-10-CM | POA: Diagnosis not present

## 2015-10-21 DIAGNOSIS — M1712 Unilateral primary osteoarthritis, left knee: Secondary | ICD-10-CM

## 2015-10-21 MED ORDER — TRAMADOL HCL 50 MG PO TABS: 25.0000 mg | ORAL_TABLET | Freq: Four times a day (QID) | ORAL | Status: DC | PRN

## 2015-10-21 NOTE — Progress Notes (Addendum)
Dr. Frederico Hamman T. Katheryn Culliton, MD, [facility] [place], [address] Phone: [phone] Fax: [phone]  10/21/2015  Patient: Amanda [name], MRN: [id number], DOB: 01/01/1951, 64 y.o.  Primary Physician:  Owens Loffler, MD   Chief Complaint  Patient presents with  . Follow-up    Left Knee Pain   Subjective:   Amanda [name] is a 64 y.o. very pleasant female patient who presents with the following:  vvery well-known patient who I've seen a number of times recently who presents with recurrent left-sided knee pain.  I injected the patient's knee with corticosteroid in July 2016, and she had relief of symptoms for only about 4 weeks.  Possibly even less than this.  Her knees of been an ongoing issue for years now, and she is now quite limited in her ability to even walk at baseline, and she has to use her arms to get up from a seated position.  She is a great deal of difficulty going up and down stairs, and she has to use right leg as a lead leg going up stairs.  She has some significant restrictions in range of motion and pain that is quite limiting at this point.  She is able to take and tolerate some anti-inflammatories as well as Tylenol, which helps somewhat transiently.  Past Medical History, Surgical History, Social History, Family History, Problem List, Medications, and Allergies have been reviewed and updated if relevant.  Patient Active Problem List   Diagnosis Date Noted  . Palpitations 07/18/2015  . Osteoarthritis Knees, bilateral, moderate 06/16/2015  . Smoker 05/10/2015  . Solitary pulmonary nodule 03/28/2013  . Cardiac murmur 03/15/2012  . Hypertension   . UNSPECIFIED VITAMIN D DEFICIENCY 05/29/2009  . Pure hypercholesterolemia 12/07/2007  . DEPRESSIVE DISORDER, RCR, SEVERE 12/07/2007  . Diabetes mellitus type 2, controlled ([place]) 08/10/2007    Past Medical History  Diagnosis Date  . Depression   .  Anxiety   . Hyperlipidemia   . GERD (gastroesophageal reflux disease)   . Tobacco abuse   . Hypertension   . Smoker 05/10/2015  . Degenerative arthritis of knee, bilateral 06/16/2015    Past Surgical History  Procedure Laterality Date  . Tonsillectomy      4 yoa  . Ovarian cyst removal  1972  . Abdominal hysterectomy  1993  . Fracture surgery  06/2002    right leg  . Wrist fracture surgery  11/2008    Social History   Social History  . Marital Status: Married    Spouse Name: N/A  . Number of Children: 2  . Years of Education: N/A   Occupational History  . [place] History Main Topics  . Smoking status: Current Every Day Smoker -- 1.00 packs/day    Types: Cigarettes  . Smokeless tobacco: Never Used  . Alcohol Use: Yes     Comment: occ glass of wine  . Drug Use: No  . Sexual Activity: Not on file   Other Topics Concern  . Not on file   Social History Narrative    Family History  Problem Relation Age of Onset  . Hyperlipidemia Mother   . Arthritis Mother   . Diabetes Mother   . Cancer Paternal Grandmother     breast    Allergies  Allergen Reactions  . Diazepam     REACTION: unspecified    Medication list reviewed and updated in full in [facility].   GEN: No acute illnesses, no fevers, chills. GI: No n/v/d, eating normally Pulm: No SOB Interactive and getting along well at home.  Otherwise, ROS is as per the HPI.  Objective:   BP 110/60 mmHg  Pulse 78  Temp(Src) 98.3 F (36.8 C) (Oral)  Ht 5\' 9"  (1.753 m)  Wt 203 lb 12 oz (92.42 kg)  BMI 30.07 kg/m2  GEN: WDWN, NAD, Non-toxic, A & O x 3 HEENT: Atraumatic, Normocephalic. Neck supple. No masses, No LAD. Ears and Nose: No external deformity. EXTR: No c/c/e neurovasc intact  PSYCH: Normally interactive. Conversant. Not depressed or anxious appearing.  Calm demeanor.   Knee:  L, marked antalgic gait Gait: as above ROM: loss 3 deg ext, flexion to  90 Effusion: mod Echymosis or edema: none Patellar tendon NT Painful PLICA: neg Patellar grind: negative Medial and lateral patellar facet loading: TTP more medially medial and lateral joint lines: both tender Mcmurray's pain + Flexion-pinch POS Varus and valgus stress: stable Lachman: neg Ant and Post drawer: neg Hip abduction, IR, ER: WNL Hip flexion str: 5/5 Hip abd: 5/5 Quad: 5/5 VMO atrophy: mild Hamstring concentric and eccentric: 5/5   Laboratory and Imaging Data: CLINICAL DATA: Bilateral knee pain for multiple years. No known injury.  EXAM: BILATERAL KNEES STANDING - 1 VIEW  COMPARISON: None.  FINDINGS: The frontal standing view of both knees demonstrates moderate to marked medial joint space narrowing on the left and mild to moderate medial joint space narrowing on the right. There is also mild medial and lateral spur formation on the left and mild lateral spur formation on the right.  IMPRESSION: Bilateral degenerative changes.   Electronically Signed  By: Claudie Revering M.D.  On: 06/13/2015 15:44  Dg Knee Ap/lat W/sunrise Left  10/21/2015  CLINICAL DATA:  Chronic left knee pain. EXAM: LEFT KNEE 3 VIEWS COMPARISON:  06/13/2015 FINDINGS: Moderate tricompartmental osteoarthritic change. No evidence of fracture dislocation. No significant joint effusion. IMPRESSION: No acute findings. Moderate osteoarthritis. Electronically Signed   By: Marin Olp M.D.   On: 10/21/2015 16:33    The radiological images were independently reviewed by myself in the office and results were reviewed with the patient. My independent interpretation of images:  On my review, the patient appears to have more advanced osteoarthritic changes.  Particularly in the medial compartment there is at least moderate to severe degenerative changes.  Significant loss in the patellofemoral space as well.  Overall moderate to advanced osteoarthritic changes. Electronically Signed  By:  Owens Loffler, MD On: 10/22/2015 6:16 AM   Assessment and Plan:   Primary osteoarthritis of left knee  Left knee pain - Plan: DG Knee AP/LAT W/Sunrise Left  My overall impression of the degree of osteoarthritis given the clinical picture as well as the patient's films differs somewhat from radiology.   The patient is only able to flex her knee 90, and this is been ongoing now for quite a long time.  She also has a loss of extension, and she has moderate to advanced tricompartmental arthritis on radiographs.  Corticosteroid injections of had limited effect for the patient.  She can continue to use anti-inflammatories, Tylenol, ice, and I gave her to tramadol to use on a p.r.n. Basis as well.  I'm doubtful that anything short of the total knee arthroplasty will help the patient.  She is going to think about this and talk about it with her husband and family, but if she would like for a referral, and  that she will call me back in the next week or so, requesting a referral to [facility].  Addendum: After discussion with family, she has talked about it with her family, and should would like to consult with a total joint replacement surgeon in [place].   Follow-up: prn  New Prescriptions   TRAMADOL (ULTRAM) 50 MG TABLET    Take 0.5 tablets (25 mg total) by mouth every 6 (six) hours as needed.   Orders Placed This Encounter  Procedures  . DG Knee AP/LAT W/Sunrise Left    Signed,  Kimisha Eunice T. Bernell Sigal, MD   Patient's Medications  New Prescriptions   TRAMADOL (ULTRAM) 50 MG TABLET    Take 0.5 tablets (25 mg total) by mouth every 6 (six) hours as needed.  Previous Medications   AMITRIPTYLINE (ELAVIL) 50 MG TABLET    TAKE ONE TABLET BY MOUTH AT BEDTIME   ASCORBIC ACID (VITAMIN C) 1000 MG TABLET    Take 1,000 mg by mouth daily.   ASPIRIN 81 MG TABLET    Take 81 mg by mouth daily.   ATORVASTATIN (LIPITOR) 40 MG TABLET    TAKE ONE TABLET BY MOUTH ONCE DAILY   CHOLECALCIFEROL (VITAMIN D)  1000 UNITS CAPSULE    Take 2,000 Units by mouth daily.    CLONAZEPAM (KLONOPIN) 0.5 MG TABLET    TAKE ONE TABLET BY MOUTH TWICE DAILY AS NEEDED    CYCLOBENZAPRINE (FLEXERIL) 10 MG TABLET    TAKE ONE TABLET BY MKOUTH THREE TIMES A DAY AS NEEDED FOR MUSCLE SPASMS    DIPHENHYDRAMINE (BENADRYL) 25 MG CAPSULE    Take 25 mg by mouth 2 (two) times daily.   DOCUSATE CALCIUM (STOOL SOFTENER PO)    Take 1 tablet by mouth as needed.   ESOMEPRAZOLE (NEXIUM) 40 MG CAPSULE    Take 1 capsule (40 mg total) by mouth daily before breakfast.   MAGNESIUM OXIDE (MAG-OX) 400 MG TABLET    Take 400 mg by mouth daily.   METHYLCELLULOSE, LAXATIVE, (FIBER THERAPY PO)    Take by mouth as needed.   METOPROLOL TARTRATE (LOPRESSOR) 25 MG TABLET    TAKE ONE TABLET BY MOUTH TWICE DAILY   MULTIPLE VITAMIN (MULTIVITAMIN) TABLET    Take 1 tablet by mouth daily.   NAPROXEN SODIUM 220 MG CAPS    Take 2 capsules by mouth daily.   PROZAC 40 MG CAPSULE    TAKE TWO CAPSULES BY MOUTH ONCE DAILY  Modified Medications   No medications on file  Discontinued Medications   No medications on file

## 2015-10-21 NOTE — Progress Notes (Signed)
Pre visit review using our clinic review tool, if applicable. No additional management support is needed unless otherwise documented below in the visit note. 

## 2015-10-23 ENCOUNTER — Telehealth: Payer: Self-pay | Admitting: Family Medicine

## 2015-10-23 NOTE — Telephone Encounter (Signed)
Pt called, wanted PCP to know that after meeting with family that she would like to go ahead and have the knee replacement.  She would prefer to go to [facility] for the surgery. Best number to reach pt is [phone]

## 2015-10-24 NOTE — Addendum Note (Signed)
Addended by: Owens Loffler on: 10/24/2015 09:10 AM   Modules accepted: Orders

## 2015-10-24 NOTE — Telephone Encounter (Signed)
Consult [facility], Alvan [facility], or [place] - made.

## 2015-10-25 ENCOUNTER — Ambulatory Visit (INDEPENDENT_AMBULATORY_CARE_PROVIDER_SITE_OTHER): Payer: Medicare Other | Admitting: Neurology

## 2015-10-25 ENCOUNTER — Other Ambulatory Visit (INDEPENDENT_AMBULATORY_CARE_PROVIDER_SITE_OTHER): Payer: Medicare Other

## 2015-10-25 ENCOUNTER — Encounter: Payer: Self-pay | Admitting: Neurology

## 2015-10-25 VITALS — BP 130/84 | HR 85 | Ht 68.0 in | Wt 208.0 lb

## 2015-10-25 DIAGNOSIS — R51 Headache: Secondary | ICD-10-CM

## 2015-10-25 DIAGNOSIS — R413 Other amnesia: Secondary | ICD-10-CM

## 2015-10-25 DIAGNOSIS — R519 Headache, unspecified: Secondary | ICD-10-CM

## 2015-10-25 LAB — C-REACTIVE PROTEIN: CRP: 0.1 mg/dL — ABNORMAL LOW (ref 0.5–20.0)

## 2015-10-25 MED ORDER — AMITRIPTYLINE HCL 50 MG PO TABS: ORAL_TABLET | ORAL | Status: DC

## 2015-10-25 NOTE — Progress Notes (Signed)
NEUROLOGY CONSULTATION NOTE  [name] MRN: [id number] DOB: 12-26-1950  Referring provider: Dr. Owens Loffler Primary care provider: Dr. Owens Loffler  Reason for consult:  Memory loss, headaches  Dear Dr Lorelei Pont:  Thank you for your kind referral of Amanda [name] for consultation of the above symptoms. Although her history is well known to you, please allow me to reiterate it for the purpose of our medical record. Records and images were personally reviewed where available.  HISTORY OF PRESENT ILLNESS: This is a pleasant 64 year old right-handed woman with a history of hypertension, hyperlipidemia, diet-controlled diabetes, neuropathy, anxiety, presenting for evaluation of worsening memory and headaches. She started noticing worsening memory changes since last summer. She feels foggy a lot of times and feels her memory is "off the charts." She cannot recall conversations from 15 minutes prior. She has had trouble with long-term memory as well, and her family is concerned this is due to taking Prozac for many years. She feels her depression is under control as long as she takes medication. She lives with her husband and daughter, who have noticed memory changes as well. Her daughter took over bill payments 1-1/2 years ago. She reports overall compliance with medications. She denies getting lost driving. She has occasional word-finding difficulties. There is no family history of dementia. She has had several falls and head injuries over the years. She denies any significant alcohol intake.   She reports headaches started around 2 years ago after she had a bad fall in school and fell on her knees with her head hitting the door. She has pain mostly over the left side of her head, with some tingling over the left temporal region. She feels she has to rub it to feel better. She reports the left side of her head "just feels strange," tight, as if something is under her skin making it  feel tight and tender. She also reports a sore spot over the vertex. Some days her headaches are like a toothache, she feels they are like a muscle ache and rubbing it helps but sometimes tender to touch. Pain occurs around 3 times a week, lasting 2-3 days. She feels there has been deformity changes over her forehead, early this year it looked like there was a big egg on her forehead, and she now feels a ridge over her forehead. She denies any diplopia, dysarthria, dysphagia, but has occasional pain with chewing over the left jaw and back of her ear. She has neck and back pain, sometimes her whole left side hurts. She has had chronic spinal nerve damage since her back surgery, with stinging/burning pain that amitriptyline helps with. She has occasional numbness in both feet and bilateral leg pain, as if her "bones are bruised." Taking magnesium has helped some with this. Her mother and sister have migraines.   I personally reviewed MRI brain with and without contrast done 09/20/15 which did not show any acute changes, there was mild chronic microvascular disease.  Laboratory Data: Lab Results  Component Value Date   WBC 7.5 09/18/2015   HGB 12.8 09/18/2015   HCT 38.5 09/18/2015   MCV 102.9* 09/18/2015   PLT 222.0 09/18/2015     Chemistry      Component Value Date/Time   NA 140 09/18/2015 1318   K 4.2 09/18/2015 1318   CL 105 09/18/2015 1318   CO2 29 09/18/2015 1318   BUN 11 09/18/2015 1318   CREATININE 0.71 09/18/2015 1318  Component Value Date/Time   CALCIUM 9.5 09/18/2015 1318   ALKPHOS 41 09/18/2015 1318   AST 18 09/18/2015 1318   ALT 15 09/18/2015 1318   BILITOT 0.4 09/18/2015 1318     Lab Results  Component Value Date   TSH 0.62 09/18/2015   Lab Results  Component Value Date   VITAMINB12 305 09/18/2015    PAST MEDICAL HISTORY: Past Medical History  Diagnosis Date  . Depression   . Anxiety   . Hyperlipidemia   . GERD (gastroesophageal reflux disease)   . Tobacco  abuse   . Hypertension   . Smoker 05/10/2015  . Degenerative arthritis of knee, bilateral 06/16/2015  . Diabetes ([place])     diet managed    PAST SURGICAL HISTORY: Past Surgical History  Procedure Laterality Date  . Tonsillectomy      4 yoa  . Ovarian cyst removal  1972  . Abdominal hysterectomy  1993  . Wrist fracture surgery  11/2008  . Esophageal dilation      x 3    MEDICATIONS: Current Outpatient Prescriptions on File Prior to Visit  Medication Sig Dispense Refill  . amitriptyline (ELAVIL) 50 MG tablet TAKE ONE TABLET BY MOUTH AT BEDTIME 30 tablet 5  . Ascorbic Acid (VITAMIN C) 1000 MG tablet Take 1,000 mg by mouth daily.    Marland Kitchen aspirin 81 MG tablet Take 81 mg by mouth daily.    Marland Kitchen atorvastatin (LIPITOR) 40 MG tablet TAKE ONE TABLET BY MOUTH ONCE DAILY 30 tablet 5  . Cholecalciferol (VITAMIN D) 1000 UNITS capsule Take 2,000 Units by mouth daily.     . clonazePAM (KLONOPIN) 0.5 MG tablet TAKE ONE TABLET BY MOUTH TWICE DAILY AS NEEDED  30 tablet 3  . cyclobenzaprine (FLEXERIL) 10 MG tablet TAKE ONE TABLET BY MKOUTH THREE TIMES A DAY AS NEEDED FOR MUSCLE SPASMS  50 tablet 5  . diphenhydrAMINE (BENADRYL) 25 mg capsule Take 25 mg by mouth 2 (two) times daily.    Mariane Baumgarten Calcium (STOOL SOFTENER PO) Take 1 tablet by mouth as needed.    Marland Kitchen esomeprazole (NEXIUM) 40 MG capsule Take 1 capsule (40 mg total) by mouth daily before breakfast. 90 capsule 3  . magnesium oxide (MAG-OX) 400 MG tablet Take 400 mg by mouth daily.    . Methylcellulose, Laxative, (FIBER THERAPY PO) Take by mouth as needed.    . metoprolol tartrate (LOPRESSOR) 25 MG tablet TAKE ONE TABLET BY MOUTH TWICE DAILY 60 tablet 3  . Multiple Vitamin (MULTIVITAMIN) tablet Take 1 tablet by mouth daily.    . Naproxen Sodium 220 MG CAPS Take 2 capsules by mouth daily.    Marland Kitchen PROZAC 40 MG capsule TAKE TWO CAPSULES BY MOUTH ONCE DAILY 60 capsule 2  . traMADol (ULTRAM) 50 MG tablet Take 0.5 tablets (25 mg total) by mouth every 6 (six)  hours as needed. 50 tablet 2   No current facility-administered medications on file prior to visit.    ALLERGIES: Allergies  Allergen Reactions  . Diazepam     REACTION: unspecified    FAMILY HISTORY: Family History  Problem Relation Age of Onset  . Hyperlipidemia Mother   . Arthritis Mother   . Diabetes Mother   . Cancer Paternal Grandmother     breast  . Hypercholesterolemia Sister   . Heart disease Mother     SOCIAL HISTORY: Social History   Social History  . Marital Status: Married    Spouse Name: N/A  . Number of Children: 2  .  Years of Education: N/A   Occupational History  . [employer] History Main Topics  . Smoking status: Current Every Day Smoker -- 1.00 packs/day    Types: Cigarettes  . Smokeless tobacco: Never Used  . Alcohol Use: 0.0 oz/week    0 Standard drinks or equivalent per week     Comment: glass of wine twice a month   . Drug Use: No  . Sexual Activity: Not on file   Other Topics Concern  . Not on file   Social History Narrative    REVIEW OF SYSTEMS: Constitutional: No fevers, chills, or sweats, no generalized fatigue, change in appetite Eyes: No visual changes, double vision, eye pain Ear, nose and throat: No hearing loss, ear pain, nasal congestion, sore throat Cardiovascular: No chest pain, palpitations Respiratory:  No shortness of breath at rest or with exertion, wheezes GastrointestinaI: No nausea, vomiting, diarrhea, abdominal pain, fecal incontinence Genitourinary:  No dysuria, urinary retention or frequency Musculoskeletal:  + neck pain, back pain Integumentary: No rash, pruritus, skin lesions Neurological: as above Psychiatric: + depression, insomnia, anxiety Endocrine: No palpitations, fatigue, diaphoresis, mood swings, change in appetite, change in weight, increased thirst Hematologic/Lymphatic:  No anemia, purpura, petechiae. Allergic/Immunologic: no itchy/runny eyes, nasal congestion,  recent allergic reactions, rashes  PHYSICAL EXAM: Filed Vitals:   10/25/15 1243  BP: 130/84  Pulse: 85   General: No acute distress Head:  Normocephalic/atraumatic Eyes: Fundoscopic exam shows bilateral sharp discs, no vessel changes, exudates, or hemorrhages Neck: supple, no paraspinal tenderness, full range of motion Back: No paraspinal tenderness Heart: regular rate and rhythm Lungs: Clear to auscultation bilaterally. Vascular: No carotid bruits. Skin/Extremities: No rash, no edema Neurological Exam: Mental status: alert and oriented to person, place, and time, no dysarthria or aphasia, Fund of knowledge is appropriate.  Recent and remote memory are intact.  Attention and concentration are normal.    Able to name objects and repeat phrases. CDT 5/5 MMSE - Mini Mental State Exam 10/25/2015  Orientation to time 5  Orientation to Place 5  Registration 3  Attention/ Calculation 5  Recall 3  Language- name 2 objects 2  Language- repeat 1  Language- follow 3 step command 3  Language- read & follow direction 1  Write a sentence 1  Copy design 1  Total score 30   Cranial nerves: CN I: not tested CN II: pupils equal, round and reactive to light, visual fields intact, fundi unremarkable. CN III, IV, VI:  full range of motion, no nystagmus, no ptosis CN V: decreased light touch on left V1-3, decreased pin left V1,2, decreased cold left V2 CN VII: upper and lower face symmetric CN VIII: hearing intact to finger rub CN IX, X: gag intact, uvula midline CN XI: sternocleidomastoid and trapezius muscles intact CN XII: tongue midline Bulk & Tone: normal, no fasciculations. Motor: 5/5 throughout with no pronator drift. Sensation: decreased vibration to ankles bilaterally, otherwise intact to light touch, cold, pin,and joint position sense.  No extinction to double simultaneous stimulation.  Romberg test negative Deep Tendon Reflexes: +1 throughout except for absent ankle jerks  bilaterally, no ankle clonus Plantar responses: downgoing bilaterally Cerebellar: no incoordination on finger to nose, heel to shin. No dysdiadochokinesia Gait: slow and cautious due to knee pain, no ataxia, difficulty with tandem walk due to pain Tremor: none  IMPRESSION: This is a pleasant 64 year old right-handed woman with a history of hypertension, hyperlipidemia, diet-controlled diabetes, neuropathy, presenting for worsening memory and  headaches. Her neurological exam shows some sensory changes over the left side of her face, otherwise no other focal abnormalities. MMSE today normal 30/30. We discussed different causes of memory loss, TSH and B12 normal, MRI brain unremarkable. Symptoms likely normal age-related memory changes, no indication to start cholinesterase inhibitors at this time. We discussed the importance of control of vascular risk factors, physical exercise, and brain stimulation exercises for brain health. The headaches with left facial pain/tightening may be due to tension-type headaches, however in this age group, temporal arteritis should be ruled out. Check ESR and CRP. She may benefit from a short-course of Prednisone if labs are normal. We discussed symptomatic treatment of headaches with headache prophylactic medication, she is already taking amitriptyline for back pain and will increase dose to 75m qhs. Continue to monitor symptoms, she will follow-up in 3 months or earlier if needed.   Thank you for allowing me to participate in the care of this patient. Please do not hesitate to call for any questions or concerns.   KEllouise Newer M.D.  CC: Dr. CLorelei Pont

## 2015-10-25 NOTE — Patient Instructions (Signed)
1. Bloodwork for ESR, CRP 2. Once we get results of bloodwork, we will let you know about Prednisone course 3. Increase amitriptyline 46m: Take 1 & 1/2 tablets at night 4. Physical exercise and brain stimulation exercises are important for brain health 5. Follow-up in 3 months, call for any problems

## 2015-10-28 LAB — SEDIMENTATION RATE: Sed Rate: 14 mm/h (ref 0–22)

## 2015-10-29 ENCOUNTER — Telehealth: Payer: Self-pay | Admitting: Family Medicine

## 2015-10-29 DIAGNOSIS — R51 Headache: Principal | ICD-10-CM

## 2015-10-29 DIAGNOSIS — R519 Headache, unspecified: Secondary | ICD-10-CM | POA: Insufficient documentation

## 2015-10-29 DIAGNOSIS — R413 Other amnesia: Secondary | ICD-10-CM | POA: Insufficient documentation

## 2015-10-29 MED ORDER — PREDNISONE 20 MG PO TABS: ORAL_TABLET | ORAL | Status: DC

## 2015-10-29 NOTE — Telephone Encounter (Signed)
-----   Message from Cameron Sprang, MD sent at 10/29/2015  8:32 AM EST ----- Pls let her know bloodwork came back, no evidence of inflammation. No need for long course of steroids, but we can do an 7-day course to break pain cycle, if she wants. If yes, pls Rx Prednisone 20mg : Take 3 tabs on day 1, 2-1/2 tabs on day 2, 2 tabs on day 3, 1-1/2 tabs on day 4, 1 tab on day 5, 1/2 tab on days 6 and 7, then stop. #11 tabs no refills. Thanks

## 2015-10-29 NOTE — Telephone Encounter (Signed)
Patient returned my call. Notified her of result and advisement. She is agreeable to try the 7 day course of Prednisone. Rx sent to her pharmacy.

## 2015-10-29 NOTE — Telephone Encounter (Signed)
Lmovm to rtn my call. 

## 2015-10-30 ENCOUNTER — Ambulatory Visit: Payer: Medicare Other | Admitting: Neurology

## 2015-10-30 NOTE — Telephone Encounter (Signed)
Spoke to pt on 10/24/15. The day you put ortho referral in. I called [facility] and they requested before we schedule with [facility], Al[facility] Dame or [facility] that she must call their office first. I explained this to the pt (most of the time when a speciality office request the pt call them it is regarding an outstanding balance). Pt stated she would call them. I also asked while she was on the phone with them if she wanted to make her own appt and then let me know the date and time and/or call me so I can set up appt. I had not heard back from her yet.  Received a call today stating that she called back last week and talked with Vaughan Basta regarding referral. Vaughan Basta had informed pt that there was an order in the system and she would be receiving a call. At that time the pt was trying to explain something about going to [facility] instead. But I never received that message. I was just trying to get the story straight as far as what the pt wanted me to do and she got upset and wanted Dr. Lorelei Pont to call her. Please advise.

## 2015-10-30 NOTE — Telephone Encounter (Signed)
ok 

## 2015-11-06 ENCOUNTER — Ambulatory Visit (AMBULATORY_SURGERY_CENTER): Payer: Self-pay | Admitting: *Deleted

## 2015-11-06 VITALS — Ht 68.0 in | Wt 210.0 lb

## 2015-11-06 DIAGNOSIS — Z1211 Encounter for screening for malignant neoplasm of colon: Secondary | ICD-10-CM

## 2015-11-06 MED ORDER — NA SULFATE-K SULFATE-MG SULF 17.5-3.13-1.6 GM/177ML PO SOLN: 1.0000 | Freq: Once | ORAL | Status: DC

## 2015-11-06 NOTE — Progress Notes (Signed)
No egg or soy allergy No issues with past sedation. Did have a problem with waking post op after son born No diet pills No home 02 use   Pt states she has chronic constipation. She uses a fiber pill and docusate sodium both and she still doesn't have a BM daily and they are hard even with these medicines. Pt given 2 day prep today in [place] due to this issue

## 2015-11-08 ENCOUNTER — Telehealth: Payer: Self-pay | Admitting: Family Medicine

## 2015-11-08 ENCOUNTER — Other Ambulatory Visit: Payer: Self-pay | Admitting: Family Medicine

## 2015-11-08 DIAGNOSIS — M1712 Unilateral primary osteoarthritis, left knee: Secondary | ICD-10-CM

## 2015-11-08 NOTE — Telephone Encounter (Signed)
Pt called. Had not heard from Dr. Lorelei Pont. Apologized for her frustration the other day on the phone.  Pt called [facility]. They cannot make her an appointment due to a large outstanding balance from 2004 when she broke her leg and did not have insurance nor a job. She would like to know if Dr. Lorelei Pont could recommend anyone from [facility]? Please advise  Pt is aware Dr. Lorelei Pont is out of the office today 11/08/15 and all next week. She is ok with waiting on an answer when he returns

## 2015-11-08 NOTE — Telephone Encounter (Signed)
Last office visit 10/21/2015.  Last refilled 10/22/2014 for #30 with 3 refills.  Ok to refill?

## 2015-11-09 NOTE — Telephone Encounter (Signed)
Ok to refill #30, 3 refills

## 2015-11-09 NOTE — Telephone Encounter (Signed)
i put in a consult for Dr. Marlou Sa at [facility], who is an excellent orthopedist. Our office should work on getting that consult next week.

## 2015-11-11 NOTE — Telephone Encounter (Signed)
Clonazepam called into [facility] [address] 

## 2015-11-20 ENCOUNTER — Encounter: Payer: Self-pay | Admitting: Gastroenterology

## 2015-11-20 ENCOUNTER — Ambulatory Visit (AMBULATORY_SURGERY_CENTER): Payer: Medicare Other | Admitting: Gastroenterology

## 2015-11-20 VITALS — BP 130/56 | HR 64 | Temp 98.0°F | Resp 20 | Ht 68.0 in | Wt 210.0 lb

## 2015-11-20 DIAGNOSIS — I10 Essential (primary) hypertension: Secondary | ICD-10-CM | POA: Diagnosis not present

## 2015-11-20 DIAGNOSIS — K219 Gastro-esophageal reflux disease without esophagitis: Secondary | ICD-10-CM | POA: Diagnosis not present

## 2015-11-20 DIAGNOSIS — Z1211 Encounter for screening for malignant neoplasm of colon: Secondary | ICD-10-CM

## 2015-11-20 DIAGNOSIS — F329 Major depressive disorder, single episode, unspecified: Secondary | ICD-10-CM | POA: Diagnosis not present

## 2015-11-20 DIAGNOSIS — D128 Benign neoplasm of rectum: Secondary | ICD-10-CM

## 2015-11-20 MED ORDER — SODIUM CHLORIDE 0.9 % IV SOLN: 500.0000 mL | INTRAVENOUS | Status: DC

## 2015-11-20 NOTE — Progress Notes (Signed)
Called to room to assist during endoscopic procedure.  Patient ID and intended procedure confirmed with present staff. Received instructions for my participation in the procedure from the performing physician.  

## 2015-11-20 NOTE — Patient Instructions (Signed)
Discharge instructions given. Handout on polyps. Resume previous medications. YOU HAD AN ENDOSCOPIC PROCEDURE TODAY AT THE [facility]:   Refer to the procedure report that was given to you for any specific questions about what was found during the examination.  If the procedure report does not answer your questions, please call your gastroenterologist to clarify.  If you requested that your care partner not be given the details of your procedure findings, then the procedure report has been included in a sealed envelope for you to review at your convenience later.  YOU SHOULD EXPECT: Some feelings of bloating in the abdomen. Passage of more gas than usual.  Walking can help get rid of the air that was put into your GI tract during the procedure and reduce the bloating. If you had a lower endoscopy (such as a colonoscopy or flexible sigmoidoscopy) you may notice spotting of blood in your stool or on the toilet paper. If you underwent a bowel prep for your procedure, you may not have a normal bowel movement for a few days.  Please Note:  You might notice some irritation and congestion in your nose or some drainage.  This is from the oxygen used during your procedure.  There is no need for concern and it should clear up in a day or so.  SYMPTOMS TO REPORT IMMEDIATELY:   Following lower endoscopy (colonoscopy or flexible sigmoidoscopy):  Excessive amounts of blood in the stool  Significant tenderness or worsening of abdominal pains  Swelling of the abdomen that is new, acute  Fever of 100F or higher   For urgent or emergent issues, a gastroenterologist can be reached at any hour by calling [phone].   DIET: Your first meal following the procedure should be a small meal and then it is ok to progress to your normal diet. Heavy or fried foods are harder to digest and may make you feel nauseous or bloated.  Likewise, meals heavy in dairy and vegetables can increase bloating.  Drink  plenty of fluids but you should avoid alcoholic beverages for 24 hours.  ACTIVITY:  You should plan to take it easy for the rest of today and you should NOT DRIVE or use heavy machinery until tomorrow (because of the sedation medicines used during the test).    FOLLOW UP: Our staff will call the number listed on your records the next business day following your procedure to check on you and address any questions or concerns that you may have regarding the information given to you following your procedure. If we do not reach you, we will leave a message.  However, if you are feeling well and you are not experiencing any problems, there is no need to return our call.  We will assume that you have returned to your regular daily activities without incident.  If any biopsies were taken you will be contacted by phone or by letter within the next 1-3 weeks.  Please call us at [phone] if you have not heard about the biopsies in 3 weeks.    SIGNATURES/CONFIDENTIALITY: You and/or your care partner have signed paperwork which will be entered into your electronic medical record.  These signatures attest to the fact that that the information above on your After Visit Summary has been reviewed and is understood.  Full responsibility of the confidentiality of this discharge information lies with you and/or your care-partner. 

## 2015-11-20 NOTE — Progress Notes (Signed)
Report to PACU, RN, vss, BBS= Clear.  

## 2015-11-21 ENCOUNTER — Telehealth: Payer: Self-pay

## 2015-11-21 NOTE — Op Note (Addendum)
[place]  [address] [place], [id number]   COLONOSCOPY PROCEDURE REPORT  PATIENT: Amanda [name], Amanda [name]  MR#: [id number] BIRTHDATE: 1951/05/25 , 51  yrs. old GENDER: female ENDOSCOPIST: Milus Banister, MD REFERRED [id number] Celedonio Savage, M.D. PROCEDURE DATE:  11/20/2015 PROCEDURE:   Colonoscopy, screening and Colonoscopy with snare polypectomy First Screening Colonoscopy - Avg.  risk and is 50 yrs.  old or older Yes.  Prior Negative Screening - Now for repeat screening. N/A  History of Adenoma - Now for follow-up colonoscopy & has been > or = to 3 yrs.  N/A  Polyps removed today? Yes ASA CLASS:   Class II INDICATIONS:Screening for colonic neoplasia and Colorectal Neoplasm Risk Assessment for this procedure is average risk. MEDICATIONS: Monitored anesthesia care and Propofol 200 mg IV  DESCRIPTION OF PROCEDURE:   After the risks benefits and alternatives of the procedure were thoroughly explained, informed consent was obtained.  The digital rectal exam revealed no abnormalities of the rectum.   The [id number]  endoscope was introduced through the anus and advanced to the cecum, which was identified by both the appendix and ileocecal valve. No adverse events experienced.   The quality of the prep was good.  The instrument was then slowly withdrawn as the colon was fully examined. Estimated blood loss is zero unless otherwise noted in this procedure report.   COLON FINDINGS: A sessile polyp measuring 3 mm in size was found in the rectum.  A polypectomy was performed with a cold snare.  The resection was complete, but the polyps was not retrieved..   The examination was otherwise normal.  Retroflexed views revealed no abnormalities. The time to cecum = 7.8 Withdrawal time = 12.9   The scope was withdrawn and the procedure completed. COMPLICATIONS: There were no immediate complications.  ENDOSCOPIC IMPRESSION: 1.   Sessile polyp was found in the rectum;  polypectomy was performed with a cold snare 2.   The examination was otherwise normal  RECOMMENDATIONS: You should continue to follow colorectal cancer screening guidelines for "routine risk" patients with colonoscopy in 10 years.  eSigned:  Milus Banister, MD 11/20/2015 11:24 AM Revised: 11/20/2015 11:24 AM

## 2015-11-21 NOTE — Telephone Encounter (Signed)
Attempt follow up courtesy call. No answer, left voice mail message.

## 2015-11-22 DIAGNOSIS — M1712 Unilateral primary osteoarthritis, left knee: Secondary | ICD-10-CM | POA: Diagnosis not present

## 2015-11-25 ENCOUNTER — Telehealth: Payer: Self-pay

## 2015-11-25 ENCOUNTER — Other Ambulatory Visit (HOSPITAL_COMMUNITY): Payer: Self-pay | Admitting: Orthopedic Surgery

## 2015-11-25 NOTE — Telephone Encounter (Signed)
Pt left v/m for FYI to Dr Lorelei Pont; pts knee surgery is scheduled 12/23/14 by Dr Marlou Sa.

## 2015-12-10 ENCOUNTER — Other Ambulatory Visit (HOSPITAL_COMMUNITY): Payer: Self-pay | Admitting: Orthopedic Surgery

## 2015-12-10 ENCOUNTER — Other Ambulatory Visit: Payer: Self-pay | Admitting: Cardiovascular Disease

## 2015-12-11 ENCOUNTER — Other Ambulatory Visit (HOSPITAL_COMMUNITY): Payer: Self-pay | Admitting: Orthopedic Surgery

## 2015-12-16 ENCOUNTER — Ambulatory Visit (HOSPITAL_COMMUNITY)
Admission: RE | Admit: 2015-12-16 | Discharge: 2015-12-16 | Disposition: A | Discharge status: Home / Self Care | Payer: Medicare Other | Source: Ambulatory Visit | Attending: Orthopedic Surgery | Admitting: Orthopedic Surgery

## 2015-12-16 ENCOUNTER — Encounter (HOSPITAL_COMMUNITY)
Admission: RE | Admit: 2015-12-16 | Discharge: 2015-12-16 | Disposition: A | Discharge status: Home / Self Care | Payer: Medicare Other | Source: Ambulatory Visit | Attending: Orthopedic Surgery | Admitting: Orthopedic Surgery

## 2015-12-16 ENCOUNTER — Encounter (HOSPITAL_COMMUNITY): Payer: Self-pay

## 2015-12-16 DIAGNOSIS — E119 Type 2 diabetes mellitus without complications: Secondary | ICD-10-CM | POA: Insufficient documentation

## 2015-12-16 DIAGNOSIS — M170 Bilateral primary osteoarthritis of knee: Secondary | ICD-10-CM | POA: Diagnosis not present

## 2015-12-16 DIAGNOSIS — F17200 Nicotine dependence, unspecified, uncomplicated: Secondary | ICD-10-CM | POA: Insufficient documentation

## 2015-12-16 DIAGNOSIS — E559 Vitamin D deficiency, unspecified: Secondary | ICD-10-CM | POA: Insufficient documentation

## 2015-12-16 DIAGNOSIS — I10 Essential (primary) hypertension: Secondary | ICD-10-CM | POA: Diagnosis not present

## 2015-12-16 DIAGNOSIS — Z01818 Encounter for other preprocedural examination: Secondary | ICD-10-CM

## 2015-12-16 DIAGNOSIS — Z01812 Encounter for preprocedural laboratory examination: Secondary | ICD-10-CM | POA: Insufficient documentation

## 2015-12-16 DIAGNOSIS — E7800 Pure hypercholesterolemia, unspecified: Secondary | ICD-10-CM | POA: Diagnosis not present

## 2015-12-16 HISTORY — DX: Myoneural disorder, unspecified: G70.9

## 2015-12-16 LAB — URINALYSIS, ROUTINE W REFLEX MICROSCOPIC
Bilirubin Urine: NEGATIVE
Glucose, UA: NEGATIVE mg/dL
Hgb urine dipstick: NEGATIVE
Ketones, ur: NEGATIVE mg/dL
Nitrite: NEGATIVE
Protein, ur: NEGATIVE mg/dL
Specific Gravity, Urine: 1.004 — ABNORMAL LOW (ref 1.005–1.030)
pH: 7 (ref 5.0–8.0)

## 2015-12-16 LAB — CBC
HCT: 41.5 % (ref 36.0–46.0)
Hemoglobin: 13.6 g/dL (ref 12.0–15.0)
MCH: 33.9 pg (ref 26.0–34.0)
MCHC: 32.8 g/dL (ref 30.0–36.0)
MCV: 103.5 fL — ABNORMAL HIGH (ref 78.0–100.0)
Platelets: 212 10*3/uL (ref 150–400)
RBC: 4.01 MIL/uL (ref 3.87–5.11)
RDW: 11.4 % — ABNORMAL LOW (ref 11.5–15.5)
WBC: 7.3 10*3/uL (ref 4.0–10.5)

## 2015-12-16 LAB — TYPE AND SCREEN
ABO/RH(D): A POS
Antibody Screen: NEGATIVE

## 2015-12-16 LAB — GLUCOSE, CAPILLARY: Glucose-Capillary: 104 mg/dL — ABNORMAL HIGH (ref 65–99)

## 2015-12-16 LAB — BASIC METABOLIC PANEL
Anion gap: 9 (ref 5–15)
BUN: 8 mg/dL (ref 6–20)
CO2: 27 mmol/L (ref 22–32)
Calcium: 9.7 mg/dL (ref 8.9–10.3)
Chloride: 103 mmol/L (ref 101–111)
Creatinine, Ser: 0.74 mg/dL (ref 0.44–1.00)
GFR calc Af Amer: >60 mL/min (ref >60)
GFR calc non Af Amer: >60 mL/min (ref >60)
Glucose, Bld: 104 mg/dL — ABNORMAL HIGH (ref 65–99)
Potassium: 4.3 mmol/L (ref 3.5–5.1)
Sodium: 139 mmol/L (ref 135–145)

## 2015-12-16 LAB — URINE MICROSCOPIC-ADD ON

## 2015-12-16 LAB — SURGICAL PCR SCREEN
MRSA, PCR: NEGATIVE
Staphylococcus aureus: NEGATIVE

## 2015-12-16 LAB — ABO/RH: ABO/RH(D): A POS

## 2015-12-16 NOTE — Pre-Procedure Instructions (Signed)
    Amanda [name]  12/16/2015      [facility] [place], [address] [place] [address] Phone: [phone] Fax: [phone]    Your procedure is scheduled on 12-24-2015    Tuesday   Report to [facility] Admitting at 12:20 PM    Call this number if you have problems the morning of surgery:  [phone]   Remember:  Do not eat food or drink liquids after midnight.   Take these medicines the morning of surgery with A SIP OF WATER clonazepam(Klonopin) if needed, Cyclobenzaprine(Flexeril) if needed,benadryl,nexium,metoprolol(Lopressor),Tramadol(Ultram) if needed   Do not wear jewelry, make-up or nail polish.  Do not wear lotions, powders, or perfumes.  You may may wear deodorant.  Do not shave 48 hours prior to surgery.     Do not bring valuables to the hospital.  [facility] is not responsible for any belongings or valuables.  Contacts, dentures or bridgework may not be worn into surgery.  Leave your suitcase in the car.  After surgery it may be brought to your room.  For patients admitted to the hospital, discharge time will be determined by your treatment team.  .   * Special instructions:  See attached sheet for instructions on CHG showers  Please read over the following fact sheets that you were given. Pain Booklet, Coughing and Deep Breathing, Blood Transfusion Information and Surgical Site Infection Prevention

## 2015-12-17 LAB — HEMOGLOBIN A1C
Hgb A1c MFr Bld: 5.8 % — ABNORMAL HIGH (ref 4.8–5.6)
Mean Plasma Glucose: 120 mg/dL

## 2015-12-17 LAB — URINE CULTURE: Culture: 1000

## 2015-12-23 MED ORDER — CHLORHEXIDINE GLUCONATE 4 % EX LIQD: 60.0000 mL | Freq: Once | CUTANEOUS | Status: DC

## 2015-12-23 MED ORDER — CEFAZOLIN SODIUM-DEXTROSE 2-3 GM-% IV SOLR
2.0000 g | INTRAVENOUS | Status: AC
  Administered 2015-12-24: 2 g via INTRAVENOUS
  Filled 2015-12-23: qty 50

## 2015-12-24 ENCOUNTER — Encounter (HOSPITAL_COMMUNITY)
Admission: RE | Disposition: A | Discharge status: Skilled Nursing Facility | Payer: Self-pay | Source: Ambulatory Visit | Attending: Orthopedic Surgery

## 2015-12-24 ENCOUNTER — Encounter (HOSPITAL_COMMUNITY): Payer: Self-pay | Admitting: General Practice

## 2015-12-24 ENCOUNTER — Inpatient Hospital Stay (HOSPITAL_COMMUNITY)
Admission: RE | Admit: 2015-12-24 | Discharge: 2015-12-27 | DRG: 470 | Disposition: A | Discharge status: Skilled Nursing Facility | Payer: Medicare Other | Source: Ambulatory Visit | Attending: Orthopedic Surgery | Admitting: Orthopedic Surgery

## 2015-12-24 ENCOUNTER — Inpatient Hospital Stay (HOSPITAL_COMMUNITY): Payer: Medicare Other | Admitting: Anesthesiology

## 2015-12-24 DIAGNOSIS — F419 Anxiety disorder, unspecified: Secondary | ICD-10-CM | POA: Diagnosis present

## 2015-12-24 DIAGNOSIS — M1712 Unilateral primary osteoarthritis, left knee: Secondary | ICD-10-CM | POA: Diagnosis not present

## 2015-12-24 DIAGNOSIS — Z85830 Personal history of malignant neoplasm of bone: Secondary | ICD-10-CM

## 2015-12-24 DIAGNOSIS — Z8349 Family history of other endocrine, nutritional and metabolic diseases: Secondary | ICD-10-CM | POA: Diagnosis not present

## 2015-12-24 DIAGNOSIS — K5900 Constipation, unspecified: Secondary | ICD-10-CM | POA: Diagnosis present

## 2015-12-24 DIAGNOSIS — F17210 Nicotine dependence, cigarettes, uncomplicated: Secondary | ICD-10-CM | POA: Diagnosis present

## 2015-12-24 DIAGNOSIS — R21 Rash and other nonspecific skin eruption: Secondary | ICD-10-CM | POA: Diagnosis not present

## 2015-12-24 DIAGNOSIS — I10 Essential (primary) hypertension: Secondary | ICD-10-CM | POA: Diagnosis not present

## 2015-12-24 DIAGNOSIS — Z471 Aftercare following joint replacement surgery: Secondary | ICD-10-CM | POA: Diagnosis not present

## 2015-12-24 DIAGNOSIS — R262 Difficulty in walking, not elsewhere classified: Secondary | ICD-10-CM | POA: Diagnosis not present

## 2015-12-24 DIAGNOSIS — E7800 Pure hypercholesterolemia, unspecified: Secondary | ICD-10-CM | POA: Diagnosis present

## 2015-12-24 DIAGNOSIS — E785 Hyperlipidemia, unspecified: Secondary | ICD-10-CM | POA: Diagnosis present

## 2015-12-24 DIAGNOSIS — G8918 Other acute postprocedural pain: Secondary | ICD-10-CM | POA: Diagnosis not present

## 2015-12-24 DIAGNOSIS — K219 Gastro-esophageal reflux disease without esophagitis: Secondary | ICD-10-CM | POA: Diagnosis present

## 2015-12-24 DIAGNOSIS — R278 Other lack of coordination: Secondary | ICD-10-CM | POA: Diagnosis not present

## 2015-12-24 DIAGNOSIS — Z833 Family history of diabetes mellitus: Secondary | ICD-10-CM | POA: Diagnosis not present

## 2015-12-24 DIAGNOSIS — Z96652 Presence of left artificial knee joint: Secondary | ICD-10-CM | POA: Diagnosis not present

## 2015-12-24 DIAGNOSIS — Z8701 Personal history of pneumonia (recurrent): Secondary | ICD-10-CM | POA: Diagnosis not present

## 2015-12-24 DIAGNOSIS — F338 Other recurrent depressive disorders: Secondary | ICD-10-CM | POA: Diagnosis present

## 2015-12-24 DIAGNOSIS — M179 Osteoarthritis of knee, unspecified: Secondary | ICD-10-CM | POA: Diagnosis not present

## 2015-12-24 DIAGNOSIS — M170 Bilateral primary osteoarthritis of knee: Principal | ICD-10-CM | POA: Diagnosis present

## 2015-12-24 DIAGNOSIS — Z888 Allergy status to other drugs, medicaments and biological substances status: Secondary | ICD-10-CM

## 2015-12-24 DIAGNOSIS — Z803 Family history of malignant neoplasm of breast: Secondary | ICD-10-CM

## 2015-12-24 DIAGNOSIS — Z8261 Family history of arthritis: Secondary | ICD-10-CM

## 2015-12-24 DIAGNOSIS — M6281 Muscle weakness (generalized): Secondary | ICD-10-CM | POA: Diagnosis not present

## 2015-12-24 DIAGNOSIS — Z8249 Family history of ischemic heart disease and other diseases of the circulatory system: Secondary | ICD-10-CM | POA: Diagnosis not present

## 2015-12-24 DIAGNOSIS — E119 Type 2 diabetes mellitus without complications: Secondary | ICD-10-CM | POA: Diagnosis not present

## 2015-12-24 HISTORY — PX: TOTAL KNEE ARTHROPLASTY: SHX125

## 2015-12-24 LAB — PROTIME-INR
INR: 1.1 (ref 0.00–1.49)
Prothrombin Time: 14.4 seconds (ref 11.6–15.2)

## 2015-12-24 LAB — GLUCOSE, CAPILLARY
Glucose-Capillary: 108 mg/dL — ABNORMAL HIGH (ref 65–99)
Glucose-Capillary: 93 mg/dL (ref 65–99)
Glucose-Capillary: 146 mg/dL — ABNORMAL HIGH (ref 65–99)

## 2015-12-24 SURGERY — ARTHROPLASTY, KNEE, TOTAL
Anesthesia: Regional | Site: Knee | Laterality: Left

## 2015-12-24 MED ORDER — CLONIDINE HCL (ANALGESIA) 100 MCG/ML EP SOLN
EPIDURAL | Status: DC | PRN
  Administered 2015-12-24: 1 mL

## 2015-12-24 MED ORDER — ACETAMINOPHEN 650 MG RE SUPP: 650.0000 mg | Freq: Four times a day (QID) | RECTAL | Status: DC | PRN

## 2015-12-24 MED ORDER — ATORVASTATIN CALCIUM 40 MG PO TABS
40.0000 mg | ORAL_TABLET | Freq: Every day | ORAL | Status: DC
Start: 2015-12-25 — End: 2015-12-27
  Administered 2015-12-25 – 2015-12-26 (×2): 40 mg via ORAL
  Filled 2015-12-24 (×2): qty 1

## 2015-12-24 MED ORDER — WARFARIN SODIUM 5 MG PO TABS
5.0000 mg | ORAL_TABLET | Freq: Once | ORAL | Status: AC
  Administered 2015-12-25: 5 mg via ORAL
  Filled 2015-12-24: qty 1

## 2015-12-24 MED ORDER — LIDOCAINE HCL (CARDIAC) 20 MG/ML IV SOLN
INTRAVENOUS | Status: AC
  Filled 2015-12-24: qty 5

## 2015-12-24 MED ORDER — OXYCODONE HCL 5 MG/5ML PO SOLN: 5.0000 mg | Freq: Once | ORAL | Status: DC | PRN

## 2015-12-24 MED ORDER — FLUOXETINE HCL 40 MG PO CAPS: 40.0000 mg | ORAL_CAPSULE | ORAL | Status: DC

## 2015-12-24 MED ORDER — CYCLOBENZAPRINE HCL 5 MG PO TABS
5.0000 mg | ORAL_TABLET | Freq: Three times a day (TID) | ORAL | Status: DC | PRN
  Administered 2015-12-25 – 2015-12-26 (×3): 5 mg via ORAL
  Filled 2015-12-24 (×4): qty 1

## 2015-12-24 MED ORDER — TRANEXAMIC ACID 1000 MG/10ML IV SOLN
2000.0000 mg | INTRAVENOUS | Status: DC | PRN
  Administered 2015-12-24: 2000 mg via TOPICAL

## 2015-12-24 MED ORDER — NEOSTIGMINE METHYLSULFATE 10 MG/10ML IV SOLN
INTRAVENOUS | Status: AC
  Filled 2015-12-24: qty 2

## 2015-12-24 MED ORDER — POTASSIUM CHLORIDE IN NACL 20-0.9 MEQ/L-% IV SOLN
INTRAVENOUS | Status: AC
  Administered 2015-12-24: 22:00:00 via INTRAVENOUS
  Filled 2015-12-24 (×2): qty 1000

## 2015-12-24 MED ORDER — OXYCODONE HCL 5 MG PO TABS
5.0000 mg | ORAL_TABLET | ORAL | Status: DC | PRN
Start: 2015-12-24 — End: 2015-12-27
  Administered 2015-12-24 – 2015-12-27 (×17): 10 mg via ORAL
  Filled 2015-12-24 (×16): qty 2

## 2015-12-24 MED ORDER — MORPHINE SULFATE (PF) 4 MG/ML IV SOLN
INTRAVENOUS | Status: AC
  Filled 2015-12-24: qty 2

## 2015-12-24 MED ORDER — VITAMIN D 1000 UNITS PO TABS
1000.0000 [IU] | ORAL_TABLET | Freq: Two times a day (BID) | ORAL | Status: DC
  Administered 2015-12-24 – 2015-12-27 (×6): 1000 [IU] via ORAL
  Filled 2015-12-24 (×6): qty 1

## 2015-12-24 MED ORDER — VITAMIN D 1000 UNITS PO CAPS: 1000.0000 [IU] | ORAL_CAPSULE | Freq: Two times a day (BID) | ORAL | Status: DC

## 2015-12-24 MED ORDER — OXYCODONE HCL 5 MG PO TABS
ORAL_TABLET | ORAL | Status: AC
  Filled 2015-12-24: qty 2

## 2015-12-24 MED ORDER — SUCCINYLCHOLINE CHLORIDE 20 MG/ML IJ SOLN
INTRAMUSCULAR | Status: AC
  Filled 2015-12-24: qty 1

## 2015-12-24 MED ORDER — LACTATED RINGERS IV SOLN: INTRAVENOUS | Status: DC

## 2015-12-24 MED ORDER — METOPROLOL TARTRATE 25 MG PO TABS
25.0000 mg | ORAL_TABLET | Freq: Two times a day (BID) | ORAL | Status: DC
  Administered 2015-12-24 – 2015-12-27 (×6): 25 mg via ORAL
  Filled 2015-12-24 (×6): qty 1

## 2015-12-24 MED ORDER — METOCLOPRAMIDE HCL 5 MG/ML IJ SOLN: 5.0000 mg | Freq: Three times a day (TID) | INTRAMUSCULAR | Status: DC | PRN

## 2015-12-24 MED ORDER — DEXAMETHASONE SODIUM PHOSPHATE 4 MG/ML IJ SOLN
INTRAMUSCULAR | Status: DC | PRN
Start: 2015-12-24 — End: 2015-12-24
  Administered 2015-12-24: 4 mg via INTRAVENOUS

## 2015-12-24 MED ORDER — DIPHENHYDRAMINE HCL 25 MG PO CAPS
25.0000 mg | ORAL_CAPSULE | Freq: Two times a day (BID) | ORAL | Status: DC
  Administered 2015-12-25 – 2015-12-27 (×6): 25 mg via ORAL
  Filled 2015-12-24 (×6): qty 1

## 2015-12-24 MED ORDER — OXYCODONE HCL 5 MG PO TABS: 5.0000 mg | ORAL_TABLET | Freq: Once | ORAL | Status: DC | PRN

## 2015-12-24 MED ORDER — BUPIVACAINE-EPINEPHRINE (PF) 0.25% -1:200000 IJ SOLN
INTRAMUSCULAR | Status: AC
  Filled 2015-12-24: qty 30

## 2015-12-24 MED ORDER — ROPIVACAINE HCL 5 MG/ML IJ SOLN
INTRAMUSCULAR | Status: DC | PRN
  Administered 2015-12-24: 20 mL via PERINEURAL

## 2015-12-24 MED ORDER — PROPOFOL 10 MG/ML IV BOLUS
INTRAVENOUS | Status: AC
  Filled 2015-12-24: qty 20

## 2015-12-24 MED ORDER — DOCUSATE SODIUM 100 MG PO CAPS
100.0000 mg | ORAL_CAPSULE | Freq: Every day | ORAL | Status: DC | PRN
  Administered 2015-12-25: 100 mg via ORAL
  Filled 2015-12-24: qty 1

## 2015-12-24 MED ORDER — PHENYLEPHRINE HCL 10 MG/ML IJ SOLN
INTRAMUSCULAR | Status: DC | PRN
  Administered 2015-12-24 (×3): 80 ug via INTRAVENOUS

## 2015-12-24 MED ORDER — HYDROMORPHONE HCL 1 MG/ML IJ SOLN
1.0000 mg | INTRAMUSCULAR | Status: DC | PRN
  Administered 2015-12-25 (×3): 1 mg via INTRAVENOUS
  Filled 2015-12-24 (×3): qty 1

## 2015-12-24 MED ORDER — PHENYLEPHRINE 40 MCG/ML (10ML) SYRINGE FOR IV PUSH (FOR BLOOD PRESSURE SUPPORT)
PREFILLED_SYRINGE | INTRAVENOUS | Status: AC
  Filled 2015-12-24: qty 30

## 2015-12-24 MED ORDER — ONDANSETRON HCL 4 MG/2ML IJ SOLN
INTRAMUSCULAR | Status: AC
  Filled 2015-12-24: qty 4

## 2015-12-24 MED ORDER — TRANEXAMIC ACID 1000 MG/10ML IV SOLN
2000.0000 mg | INTRAVENOUS | Status: DC
  Filled 2015-12-24: qty 20

## 2015-12-24 MED ORDER — BUPIVACAINE LIPOSOME 1.3 % IJ SUSP
INTRAMUSCULAR | Status: DC | PRN
  Administered 2015-12-24: 20 mL

## 2015-12-24 MED ORDER — PHENOL 1.4 % MT LIQD: 1.0000 | OROMUCOSAL | Status: DC | PRN

## 2015-12-24 MED ORDER — CLONAZEPAM 0.5 MG PO TABS: 0.5000 mg | ORAL_TABLET | Freq: Two times a day (BID) | ORAL | Status: DC | PRN

## 2015-12-24 MED ORDER — FENTANYL CITRATE (PF) 100 MCG/2ML IJ SOLN
INTRAMUSCULAR | Status: DC | PRN
  Administered 2015-12-24 (×12): 25 ug via INTRAVENOUS
  Administered 2015-12-24: 50 ug via INTRAVENOUS
  Administered 2015-12-24: 25 ug via INTRAVENOUS

## 2015-12-24 MED ORDER — DEXAMETHASONE SODIUM PHOSPHATE 4 MG/ML IJ SOLN
INTRAMUSCULAR | Status: AC
  Filled 2015-12-24: qty 2

## 2015-12-24 MED ORDER — MAGNESIUM OXIDE 400 MG PO TABS: 400.0000 mg | ORAL_TABLET | Freq: Every day | ORAL | Status: DC

## 2015-12-24 MED ORDER — CEFAZOLIN SODIUM-DEXTROSE 2-3 GM-% IV SOLR
2.0000 g | Freq: Four times a day (QID) | INTRAVENOUS | Status: AC
  Administered 2015-12-24 – 2015-12-25 (×2): 2 g via INTRAVENOUS
  Filled 2015-12-24 (×2): qty 50

## 2015-12-24 MED ORDER — SODIUM CHLORIDE 0.9 % IJ SOLN
INTRAMUSCULAR | Status: AC
  Filled 2015-12-24: qty 10

## 2015-12-24 MED ORDER — PHENYLEPHRINE 40 MCG/ML (10ML) SYRINGE FOR IV PUSH (FOR BLOOD PRESSURE SUPPORT)
PREFILLED_SYRINGE | INTRAVENOUS | Status: AC
  Filled 2015-12-24: qty 10

## 2015-12-24 MED ORDER — MAGNESIUM OXIDE 400 (241.3 MG) MG PO TABS
400.0000 mg | ORAL_TABLET | Freq: Every day | ORAL | Status: DC
  Administered 2015-12-25 – 2015-12-27 (×3): 400 mg via ORAL
  Filled 2015-12-24 (×3): qty 1

## 2015-12-24 MED ORDER — BUPIVACAINE HCL (PF) 0.25 % IJ SOLN
INTRAMUSCULAR | Status: DC | PRN
Start: 2015-12-24 — End: 2015-12-24
  Administered 2015-12-24: 30 mL

## 2015-12-24 MED ORDER — ONDANSETRON HCL 4 MG/2ML IJ SOLN
INTRAMUSCULAR | Status: DC | PRN
  Administered 2015-12-24 (×2): 4 mg via INTRAVENOUS

## 2015-12-24 MED ORDER — METOCLOPRAMIDE HCL 5 MG PO TABS: 5.0000 mg | ORAL_TABLET | Freq: Three times a day (TID) | ORAL | Status: DC | PRN

## 2015-12-24 MED ORDER — 0.9 % SODIUM CHLORIDE (POUR BTL) OPTIME
TOPICAL | Status: DC | PRN
  Administered 2015-12-24 (×3): 1000 mL

## 2015-12-24 MED ORDER — LACTATED RINGERS IV SOLN
INTRAVENOUS | Status: DC
Start: 2015-12-24 — End: 2015-12-24
  Administered 2015-12-24 (×3): via INTRAVENOUS

## 2015-12-24 MED ORDER — PROPOFOL 10 MG/ML IV BOLUS
INTRAVENOUS | Status: DC | PRN
  Administered 2015-12-24: 150 mg via INTRAVENOUS

## 2015-12-24 MED ORDER — MORPHINE SULFATE (PF) 4 MG/ML IV SOLN
INTRAVENOUS | Status: DC | PRN
  Administered 2015-12-24: 8 mg via INTRAVENOUS

## 2015-12-24 MED ORDER — ONDANSETRON HCL 4 MG PO TABS: 4.0000 mg | ORAL_TABLET | Freq: Four times a day (QID) | ORAL | Status: DC | PRN

## 2015-12-24 MED ORDER — FENTANYL CITRATE (PF) 100 MCG/2ML IJ SOLN
75.0000 ug | Freq: Once | INTRAMUSCULAR | Status: AC
  Administered 2015-12-24: 75 ug via INTRAVENOUS

## 2015-12-24 MED ORDER — SODIUM CHLORIDE 0.9 % IR SOLN
Status: DC | PRN
  Administered 2015-12-24: 3000 mL

## 2015-12-24 MED ORDER — PANTOPRAZOLE SODIUM 40 MG PO TBEC
40.0000 mg | DELAYED_RELEASE_TABLET | Freq: Every day | ORAL | Status: DC
  Administered 2015-12-25 – 2015-12-27 (×3): 40 mg via ORAL
  Filled 2015-12-24 (×3): qty 1

## 2015-12-24 MED ORDER — LIDOCAINE HCL (PF) 2 % IJ SOLN
INTRAMUSCULAR | Status: DC | PRN
  Administered 2015-12-24: 2 mL

## 2015-12-24 MED ORDER — WARFARIN - PHARMACIST DOSING INPATIENT: Freq: Every day | Status: DC

## 2015-12-24 MED ORDER — ACETAMINOPHEN 325 MG PO TABS
650.0000 mg | ORAL_TABLET | Freq: Four times a day (QID) | ORAL | Status: DC | PRN
  Administered 2015-12-25 – 2015-12-27 (×2): 650 mg via ORAL
  Filled 2015-12-24 (×2): qty 2

## 2015-12-24 MED ORDER — PROMETHAZINE HCL 25 MG/ML IJ SOLN: 6.2500 mg | INTRAMUSCULAR | Status: DC | PRN

## 2015-12-24 MED ORDER — FENTANYL CITRATE (PF) 100 MCG/2ML IJ SOLN
INTRAMUSCULAR | Status: AC
  Filled 2015-12-24: qty 2

## 2015-12-24 MED ORDER — MIDAZOLAM HCL 2 MG/2ML IJ SOLN
INTRAMUSCULAR | Status: AC
  Filled 2015-12-24: qty 2

## 2015-12-24 MED ORDER — METOPROLOL TARTRATE 1 MG/ML IV SOLN
INTRAVENOUS | Status: AC
  Filled 2015-12-24: qty 5

## 2015-12-24 MED ORDER — BUPIVACAINE-EPINEPHRINE (PF) 0.5% -1:200000 IJ SOLN
INTRAMUSCULAR | Status: AC
  Filled 2015-12-24: qty 30

## 2015-12-24 MED ORDER — HYDROMORPHONE HCL 1 MG/ML IJ SOLN: 0.2500 mg | INTRAMUSCULAR | Status: DC | PRN

## 2015-12-24 MED ORDER — ONDANSETRON HCL 4 MG/2ML IJ SOLN: 4.0000 mg | Freq: Four times a day (QID) | INTRAMUSCULAR | Status: DC | PRN

## 2015-12-24 MED ORDER — FENTANYL CITRATE (PF) 250 MCG/5ML IJ SOLN
INTRAMUSCULAR | Status: AC
  Filled 2015-12-24: qty 5

## 2015-12-24 MED ORDER — FLUOXETINE HCL 20 MG PO CAPS
80.0000 mg | ORAL_CAPSULE | ORAL | Status: DC
  Administered 2015-12-25 – 2015-12-27 (×2): 80 mg via ORAL
  Filled 2015-12-24 (×2): qty 4

## 2015-12-24 MED ORDER — VITAMIN C 500 MG PO TABS
1000.0000 mg | ORAL_TABLET | Freq: Every day | ORAL | Status: DC
  Administered 2015-12-25 – 2015-12-27 (×3): 1000 mg via ORAL
  Filled 2015-12-24 (×3): qty 2

## 2015-12-24 MED ORDER — ONDANSETRON HCL 4 MG/2ML IJ SOLN
INTRAMUSCULAR | Status: AC
  Filled 2015-12-24: qty 2

## 2015-12-24 MED ORDER — BUPIVACAINE LIPOSOME 1.3 % IJ SUSP
20.0000 mL | INTRAMUSCULAR | Status: DC
  Filled 2015-12-24: qty 20

## 2015-12-24 MED ORDER — EPHEDRINE SULFATE 50 MG/ML IJ SOLN
INTRAMUSCULAR | Status: AC
  Filled 2015-12-24: qty 1

## 2015-12-24 MED ORDER — VITAMIN C 500 MG PO TABS: 1000.0000 mg | ORAL_TABLET | Freq: Every day | ORAL | Status: DC

## 2015-12-24 MED ORDER — MENTHOL 3 MG MT LOZG
1.0000 | LOZENGE | OROMUCOSAL | Status: DC | PRN
  Administered 2015-12-25 – 2015-12-26 (×2): 3 mg via ORAL
  Filled 2015-12-24 (×2): qty 9

## 2015-12-24 MED ORDER — AMITRIPTYLINE HCL 50 MG PO TABS
75.0000 mg | ORAL_TABLET | Freq: Every day | ORAL | Status: DC
  Administered 2015-12-24 – 2015-12-26 (×3): 75 mg via ORAL
  Filled 2015-12-24 (×3): qty 1

## 2015-12-24 MED ORDER — FLUOXETINE HCL 20 MG PO CAPS
40.0000 mg | ORAL_CAPSULE | ORAL | Status: DC
  Administered 2015-12-26: 40 mg via ORAL
  Filled 2015-12-24: qty 2

## 2015-12-24 MED ORDER — BUPIVACAINE-EPINEPHRINE 0.5% -1:200000 IJ SOLN
INTRAMUSCULAR | Status: DC | PRN
  Administered 2015-12-24: 10 mL

## 2015-12-24 MED ORDER — DIPHENHYDRAMINE HCL 50 MG/ML IJ SOLN
INTRAMUSCULAR | Status: AC
  Filled 2015-12-24: qty 1

## 2015-12-24 SURGICAL SUPPLY — 76 items
BANDAGE ELASTIC 4 VELCRO ST LF (GAUZE/BANDAGES/DRESSINGS) ×2 IMPLANT
BANDAGE ESMARK 6X9 LF (GAUZE/BANDAGES/DRESSINGS) ×1 IMPLANT
BLADE SAG 18X100X1.27 (BLADE) ×2 IMPLANT
BLADE SAW SGTL 13.0X1.19X90.0M (BLADE) IMPLANT
BNDG COHESIVE 6X5 TAN STRL LF (GAUZE/BANDAGES/DRESSINGS) ×2 IMPLANT
BNDG ELASTIC 6X10 VLCR STRL LF (GAUZE/BANDAGES/DRESSINGS) ×2 IMPLANT
BNDG ESMARK 6X9 LF (GAUZE/BANDAGES/DRESSINGS) ×2
BOWL SMART MIX CTS (DISPOSABLE) ×2 IMPLANT
CAPT KNEE TOTAL 3 ×2 IMPLANT
CEMENT BONE SIMPLEX SPEEDSET (Cement) ×4 IMPLANT
COVER SURGICAL LIGHT HANDLE (MISCELLANEOUS) ×2 IMPLANT
CUFF TOURNIQUET SINGLE 34IN LL (TOURNIQUET CUFF) ×2 IMPLANT
CUFF TOURNIQUET SINGLE 44IN (TOURNIQUET CUFF) IMPLANT
DECANTER SPIKE VIAL GLASS SM (MISCELLANEOUS) ×2 IMPLANT
DRAPE INCISE IOBAN 66X45 STRL (DRAPES) IMPLANT
DRAPE ORTHO SPLIT 77X108 STRL (DRAPES) ×3
DRAPE SURG ORHT 6 SPLT 77X108 (DRAPES) ×3 IMPLANT
DRAPE U-SHAPE 47X51 STRL (DRAPES) ×2 IMPLANT
DRSG MEPILEX BORDER 4X12 (GAUZE/BANDAGES/DRESSINGS) ×2 IMPLANT
DRSG MEPILEX BORDER 4X8 (GAUZE/BANDAGES/DRESSINGS) ×2 IMPLANT
DRSG PAD ABDOMINAL 8X10 ST (GAUZE/BANDAGES/DRESSINGS) ×4 IMPLANT
DURAPREP 26ML APPLICATOR (WOUND CARE) ×2 IMPLANT
ELECT REM PT RETURN 9FT ADLT (ELECTROSURGICAL) ×2
ELECTRODE REM PT RTRN 9FT ADLT (ELECTROSURGICAL) ×1 IMPLANT
FACESHIELD WRAPAROUND (MASK) ×2 IMPLANT
GAUZE SPONGE 4X4 12PLY STRL (GAUZE/BANDAGES/DRESSINGS) ×2 IMPLANT
GAUZE XEROFORM 1X8 LF (GAUZE/BANDAGES/DRESSINGS) ×2 IMPLANT
GAUZE XEROFORM 5X9 LF (GAUZE/BANDAGES/DRESSINGS) ×2 IMPLANT
GLOVE BIOGEL PI IND STRL 7.5 (GLOVE) ×1 IMPLANT
GLOVE BIOGEL PI IND STRL 8 (GLOVE) ×2 IMPLANT
GLOVE BIOGEL PI INDICATOR 7.5 (GLOVE) ×1
GLOVE BIOGEL PI INDICATOR 8 (GLOVE) ×2
GLOVE ECLIPSE 7.0 STRL STRAW (GLOVE) ×4 IMPLANT
GLOVE SURG ORTHO 8.0 STRL STRW (GLOVE) ×4 IMPLANT
GOWN STRL REUS W/ TWL LRG LVL3 (GOWN DISPOSABLE) ×3 IMPLANT
GOWN STRL REUS W/TWL 2XL LVL3 (GOWN DISPOSABLE) ×2 IMPLANT
GOWN STRL REUS W/TWL LRG LVL3 (GOWN DISPOSABLE) ×3
HANDPIECE INTERPULSE COAX TIP (DISPOSABLE) ×1
HOOD PEEL AWAY FACE SHEILD DIS (HOOD) ×4 IMPLANT
IMMOBILIZER KNEE 20 (SOFTGOODS) IMPLANT
IMMOBILIZER KNEE 22 UNIV (SOFTGOODS) ×2 IMPLANT
IMMOBILIZER KNEE 24 THIGH 36 (MISCELLANEOUS) IMPLANT
IMMOBILIZER KNEE 24 UNIV (MISCELLANEOUS)
KIT BASIN OR (CUSTOM PROCEDURE TRAY) ×2 IMPLANT
KIT ROOM TURNOVER OR (KITS) ×2 IMPLANT
MANIFOLD NEPTUNE II (INSTRUMENTS) ×2 IMPLANT
NDL SAFETY ECLIPSE 18X1.5 (NEEDLE) ×1 IMPLANT
NEEDLE 18GX1X1/2 (RX/OR ONLY) (NEEDLE) ×2 IMPLANT
NEEDLE HYPO 18GX1.5 SHARP (NEEDLE) ×1
NEEDLE HYPO 22GX1.5 SAFETY (NEEDLE) ×4 IMPLANT
NEEDLE SPNL 18GX3.5 QUINCKE PK (NEEDLE) ×2 IMPLANT
NS IRRIG 1000ML POUR BTL (IV SOLUTION) ×4 IMPLANT
PACK TOTAL JOINT (CUSTOM PROCEDURE TRAY) ×2 IMPLANT
PACK UNIVERSAL I (CUSTOM PROCEDURE TRAY) ×2 IMPLANT
PAD ARMBOARD 7.5X6 YLW CONV (MISCELLANEOUS) ×4 IMPLANT
PAD CAST 4YDX4 CTTN HI CHSV (CAST SUPPLIES) ×1 IMPLANT
PADDING CAST COTTON 4X4 STRL (CAST SUPPLIES) ×1
PADDING CAST COTTON 6X4 STRL (CAST SUPPLIES) ×2 IMPLANT
SET HNDPC FAN SPRY TIP SCT (DISPOSABLE) ×1 IMPLANT
SPONGE GAUZE 4X4 12PLY STER LF (GAUZE/BANDAGES/DRESSINGS) ×2 IMPLANT
SPONGE LAP 18X18 X RAY DECT (DISPOSABLE) IMPLANT
STRIP CLOSURE SKIN 1/2X4 (GAUZE/BANDAGES/DRESSINGS) ×4 IMPLANT
SUCTION FRAZIER TIP 10 FR DISP (SUCTIONS) ×2 IMPLANT
SUT ETHILON 3 0 PS 1 (SUTURE) ×2 IMPLANT
SUT MNCRL AB 3-0 PS2 18 (SUTURE) ×2 IMPLANT
SUT VIC AB 0 CT1 27 (SUTURE) ×3
SUT VIC AB 0 CT1 27XBRD ANBCTR (SUTURE) ×3 IMPLANT
SUT VIC AB 1 CT1 27 (SUTURE) ×5
SUT VIC AB 1 CT1 27XBRD ANBCTR (SUTURE) ×5 IMPLANT
SUT VIC AB 2-0 CT1 27 (SUTURE) ×4
SUT VIC AB 2-0 CT1 TAPERPNT 27 (SUTURE) ×4 IMPLANT
SYR 30ML LL (SYRINGE) ×6 IMPLANT
SYR TB 1ML LUER SLIP (SYRINGE) ×2 IMPLANT
TOWEL OR 17X24 6PK STRL BLUE (TOWEL DISPOSABLE) ×4 IMPLANT
TOWEL OR 17X26 10 PK STRL BLUE (TOWEL DISPOSABLE) ×4 IMPLANT
WATER STERILE IRR 1000ML POUR (IV SOLUTION) ×4 IMPLANT

## 2015-12-24 NOTE — Interval H&P Note (Signed)
History and Physical Interval Note:  12/24/2015 1:53 PM  Amanda [name]  has presented today for surgery, with the diagnosis of LEFT KNEE OSTEOARTHRITIS  The various methods of treatment have been discussed with the patient and family. After consideration of risks, benefits and other options for treatment, the patient has consented to  Procedure(s): TOTAL KNEE ARTHROPLASTY (Left) as a surgical intervention .  The patient's history has been reviewed, patient examined, no change in status, stable for surgery.  I have reviewed the patient's chart and labs.  Questions were answered to the patient's satisfaction.     DEAN,GREGORY SCOTT

## 2015-12-24 NOTE — Consult Note (Signed)
[place] for : Coumadin  HPI/Indication : 65 y.o.female admitted LEFT KNEE OSTEOARTHRITIS who is to be started on warfarin for VTE prophylaxis following L-TKA.  Allergies: Allergies  Allergen Reactions  . Diazepam Other (See Comments)    REACTION: pt states she gets very angry and abusive verbally on this medication    Dosing weight : 94 kg  Problems: Active Problems:   Degenerative arthritis of left knee  Medical / Surgical History: Past Medical History  Diagnosis Date  . Depression   . Anxiety   . Hyperlipidemia   . GERD (gastroesophageal reflux disease)   . Tobacco abuse   . Smoker 05/10/2015  . Degenerative arthritis of knee, bilateral 06/16/2015  . Pneumonia     hx of this several times  . Heart murmur     slight per pt- benign   . Hypertension     hx of tachycardia   . History of tachycardia   . Thyroid disease     hyperthyroidism as a teenager - received some injections age 85 that corrected this   . Constipation     uses docusate and fiber  . Diabetes ([place])     diet managed-type 11  . Tachycardia   . Shortness of breath dyspnea     with exertion  . Headache   . 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 Tonsillectomy      4 yoa  . Ovarian cyst removal  1972  . Abdominal hysterectomy  1993  . Wrist fracture surgery  11/2008  . Esophageal dilation      x 3  . Colonoscopy      30 years ago was normal per pt.   . Fractured leg Right   . Salivary gland surgery    . Cholecystectomy    . Back surgery     Labs:  Recent Labs  12/24/15 2108  LABPROT 14.4  INR 1.10   Estimated Creatinine Clearance: 84.2 mL/min (by C-G formula based on Cr of 0.74).  Current Medication[s] Include: Medication PTA: Prescriptions prior to admission  Medication Sig Dispense Refill Last Dose  . amitriptyline (ELAVIL) 50 MG tablet Take 1 & 1/2  tablets at night (Patient taking differently: Take 75 mg by mouth at bedtime. ) 45 tablet 5 12/23/2015 at Unknown time  . Ascorbic Acid (VITAMIN C) 1000 MG tablet Take 1,000 mg by mouth daily.   12/23/2015 at Unknown time  . aspirin 81 MG tablet Take 81 mg by mouth daily.   12/23/2015 at Unknown time  . atorvastatin (LIPITOR) 40 MG tablet TAKE ONE TABLET BY MOUTH ONCE DAILY 30 tablet 5 12/23/2015 at Unknown time  . Cholecalciferol (VITAMIN D) 1000 UNITS capsule Take 1,000 Units by mouth 2 (two) times daily.    12/23/2015 at Unknown time  . clonazePAM (KLONOPIN) 0.5 MG tablet TAKE ONE TABLET BY MOUTH TWICE DAILY AS NEEDED (Patient taking differently: take 0.5-1 tablet three times a day as needed for anxiety) 30 tablet 3 12/23/2015 at Unknown time  . cyclobenzaprine (FLEXERIL) 10 MG tablet TAKE ONE TABLET BY MKOUTH THREE TIMES A DAY AS NEEDED FOR MUSCLE SPASMS  50 tablet 5 Past Week at Unknown time  . diphenhydrAMINE (BENADRYL) 25 mg capsule Take 25 mg by mouth 2 (two) times daily.   12/24/2015 at 0930  . Docusate Calcium (STOOL SOFTENER PO) Take 100 mg by mouth  daily as needed. For stool softner   Past Week at Unknown time  . esomeprazole (NEXIUM) 40 MG capsule Take 1 capsule (40 mg total) by mouth daily before breakfast. 90 capsule 3 12/24/2015 at 0930  . FLUoxetine (PROZAC) 40 MG capsule Take 40-80 mg by mouth as directed. Take 2 capsules on Monday,Wednesday, & Friday Take 1 capsule all other days   12/24/2015 at 0930  . magnesium oxide (MAG-OX) 400 MG tablet Take 400 mg by mouth daily.   12/23/2015 at Unknown time  . Methylcellulose, Laxative, (FIBER THERAPY PO) Take 625 mg by mouth as needed.    12/23/2015 at Unknown time  . metoprolol tartrate (LOPRESSOR) 25 MG tablet TAKE ONE TABLET BY MOUTH TWICE DAILY 60 tablet 3 12/24/2015 at 0930  . Multiple Vitamin (MULTIVITAMIN) tablet Take 1 tablet by mouth daily.   12/23/2015 at Unknown time  . Naproxen Sodium 220 MG CAPS Take 2 capsules by mouth daily.   12/23/2015  at Unknown time  . traMADol (ULTRAM) 50 MG tablet Take 0.5 tablets (25 mg total) by mouth every 6 (six) hours as needed. (Patient taking differently: Take 25 mg by mouth every 6 (six) hours as needed for moderate pain. ) 50 tablet 2 12/23/2015 at Unknown time  . predniSONE (DELTASONE) 20 MG tablet Take 3 tablets on day 1, 2 and 1/2 tablets on day 2, 2 tablets on day 3, 1 and 1/2 tablets on day 4, 1 tablet on day 5, 1/2 tablet on days 6 and 7 then stop. (Patient not taking: Reported on 11/20/2015) 11 tablet 0 Not Taking  . PROZAC 40 MG capsule TAKE TWO CAPSULES BY MOUTH ONCE DAILY (Patient not taking: Reported on 12/05/2015) 60 capsule 2 11/20/2015   Scheduled:  Scheduled:  . amitriptyline  75 mg Oral QHS  . [START ON 12/25/2015] atorvastatin  40 mg Oral q1800  .  ceFAZolin (ANCEF) IV  2 g Intravenous Q6H  . cholecalciferol  1,000 Units Oral BID  . diphenhydrAMINE  25 mg Oral BID  . fentaNYL      . [START ON 12/26/2015] FLUoxetine  40 mg Oral Once per day on Sun Tue Thu Sat  . [START ON 12/25/2015] FLUoxetine  80 mg Oral Once per day on Mon Wed Fri  . [START ON 12/25/2015] magnesium oxide  400 mg Oral Daily  . metoprolol tartrate  25 mg Oral BID  . midazolam      . oxyCODONE      . [START ON 12/25/2015] pantoprazole  40 mg Oral Daily  . [START ON 12/25/2015] vitamin C  1,000 mg Oral Daily   Infusion[s]: Infusions:  . 0.9 % NaCl with KCl 20 mEq / L     Antibiotic[s]: Anti-infectives    Start     Dose/Rate Route Frequency Ordered Stop   12/24/15 2030  ceFAZolin (ANCEF) IVPB 2 g/50 mL premix     2 g 100 mL/hr over 30 Minutes Intravenous Every 6 hours 12/24/15 2009 12/25/15 0829   12/24/15 1400  ceFAZolin (ANCEF) IVPB 2 g/50 mL premix     2 g 100 mL/hr over 30 Minutes Intravenous To [facility] 12/23/15 1332 12/24/15 1445     Assessment:  65 y/o female who will be started on Coumadin for VTE prophylaxis following L-TKA.  She has not been on anticoagulation prior to admission.    Goals:  Target INR of 2-3.  Plan: 1. Will give Coumadin 5 mg tonight.   2. Daily INR's, CBC.  Monitor for bleeding  complications. Follow platelet counts.  Diella Gillingham, Zettie Pho,  Pharm.D. 12/24/2015, 8:24 PM

## 2015-12-24 NOTE — Progress Notes (Signed)
Orthopedic Tech Progress Note Patient Details:  MEILI RIGGAN [birth year] [id number] Start time 1800, no footsie rolls at this time. CPM Left Knee CPM Left Knee: On Left Knee Flexion (Degrees): 40 Left Knee Extension (Degrees): 0  Ortho Devices Ortho Device/Splint Location: applied ohfto bed,  Ortho Device/Splint Interventions: Ordered, Application   Braulio Bosch 12/24/2015, 6:00 PM

## 2015-12-24 NOTE — Brief Op Note (Signed)
12/24/2015  5:26 PM  PATIENT:  [name]  65 y.o. female  PRE-OPERATIVE DIAGNOSIS:  LEFT KNEE OSTEOARTHRITIS  POST-OPERATIVE DIAGNOSIS:  LEFT KNEE OSTEOARTHRITIS  PROCEDURE:  Procedure(s): LEFT TOTAL KNEE ARTHROPLASTY  SURGEON:  Surgeon(s): [name], MD  ASSISTANT: Laure Kidney RNFA  ANESTHESIA:   general  EBL: 75 ml    Total I/O In: 1000 [I.V.:1000] Out: -   BLOOD ADMINISTERED: none  DRAINS: none   LOCAL MEDICATIONS USED:  exparel marcaine clonidine ms04  SPECIMEN:  No Specimen  COUNTS:  YES  TOURNIQUET:   Total Tourniquet Time Documented: Thigh (Left) - 99 minutes Total: Thigh (Left) - 99 minutes   DICTATION: .Other Dictation: Dictation Number [id number]  PLAN OF CARE: Admit to inpatient   PATIENT DISPOSITION:  PACU - hemodynamically stable

## 2015-12-24 NOTE — Transfer of Care (Signed)
Immediate Anesthesia Transfer of Care Note  Patient: Amanda [name]  Procedure(s) Performed: Procedure(s): LEFT TOTAL KNEE ARTHROPLASTY (Left)  Patient Location: PACU  Anesthesia Type:GA combined with regional for post-op pain  Level of Consciousness: awake, alert , oriented and patient cooperative  Airway & Oxygen Therapy: Patient Spontanous Breathing and Patient connected to nasal cannula oxygen  Post-op Assessment: Report given to RN and Post -op Vital signs reviewed and stable  Post vital signs: Reviewed and stable  Last Vitals:  Filed Vitals:   12/24/15 1734 12/24/15 1735  BP:  145/65  Pulse:  90  Temp: 36.7 C   Resp:  10    Complications: No apparent anesthesia complications

## 2015-12-24 NOTE — Op Note (Signed)
[name], [name]              ACCOUNT NO.:  [id number]  MEDICAL RECORD NO.:  [id number]  LOCATION:  5N15C                        FACILITY:  [facility]  PHYSICIAN:  Anderson Malta, M.D.    DATE OF BIRTH:  [birth year]  DATE OF PROCEDURE: DATE OF DISCHARGE:                              OPERATIVE REPORT   PREOPERATIVE DIAGNOSIS:  Left knee osteoarthritis.  POSTOPERATIVE DIAGNOSIS:  Left knee osteoarthritis.  PROCEDURE:  Left total knee replacement, Stryker cruciate-retaining size 4 femur, 4 tibia, 9-mm polyethylene insert, 32-mm cemented 3 pegged patella.  SURGEON:  Anderson Malta, M.D.  ASSISTANT:  Laure Kidney, RNFA.  INDICATIONS:  Amanda [name] is a 65 year old patient with end-stage knee arthritis, presents for operative management after explanation of risks and benefits.  PROCEDURE IN DETAIL:  The patient was brought to the operating room where general endotracheal anesthesia was introduced.  Preoperative antibiotics were administered.  Time-out was called.  Left leg was prescrubbed with alcohol and Betadine and allowed to air dry, prepped with DuraPrep solution and draped in a sterile manner.  Amanda [name] was used to cover the operative field.  Leg was elevated and exsanguinated with Esmarch wrap.  Tourniquet was inflated to 300 mmHg.  Anterior approach to the knee was made.  Skin and subcutaneous tissue were sharply divided.  Median parapatellar approach was made and marked with a #1 Vicryl suture.  Soft tissue elevated off the anterior distal femur as well as fat pad was partially excised.  Lateral patellofemoral ligament was released.  Some soft tissue dissection was performed medially.  ACL was sacrificed.  PCL was maintained.  In general, her ligaments were robust.  An intramedullary alignment used on the tibia to make a 9-mm cut off the least affected lateral tibial plateau.  Collaterals and posterior neurovascular structures were protected.  A 10-mm cut was then made off  the distal femur because of the patient's preop flexion contracture of around 7-8 degrees.  The patient's distal knee anatomy was between sizes 4 and 5.  Medial lateral, there would have been some overhang with the size 5.  With the size 4, there was potential for small amount of notching.  In order to avoid overhang, the decision was made to proceed with size 4.  There was a small amount of notching; however, it was felt like that was the better option than going too large.  Trial femur was placed.  Tibia was keel punched and prepared to a size 4.  The 9-mm spacer was placed and the patient had very good stability, varus and valgus stress at 0 and 30 degrees.  PCL integrity was maintained.  Patella was prepared freehand from 24 to 14, 3 pegged patellar trial was placed.  The patient had full extension, full flexion with a very minimal lift-off.  Lift-off recurred at about 135 of flexion.  This was only about 2 or 3 mm.  The patient had excellent patellar tracking using no-thumbs technique.  At this time, trial components were removed.  Thorough irrigation was performed.  Exparel was injected.  Tranexamic acid was placed and allowed to sit for 3 minutes.  Tranexamic acid was then removed.  Thorough irrigation again performed, components  cemented into position with excess cement removed. At this time, same stability parameters were maintained, 9 poly was placed.  Tourniquet was released.  Bleeding points were encountered and controlled using electrocautery. Thorough irrigation was again performed.  The knee was then closed over bolster using #1 Vicryl suture, 0 Vicryl suture, 2-0 Vicryl suture and 3- 0 Monocryl.  Bulky dressing and knee immobilizer placed.  The patient tolerated the procedure well without immediate complication, transferred to the recovery room in stable condition.     Anderson Malta, M.D.     GSD/MEDQ  D:  12/24/2015  T:  12/24/2015  Job:  [id number]

## 2015-12-24 NOTE — Anesthesia Postprocedure Evaluation (Signed)
Anesthesia Post Note  Patient: Amanda [name]  Procedure(s) Performed: Procedure(s) (LRB): LEFT TOTAL KNEE ARTHROPLASTY (Left)  Patient location during evaluation: PACU Anesthesia Type: General Level of consciousness: awake and alert and patient cooperative Pain management: pain level controlled Vital Signs Assessment: post-procedure vital signs reviewed and stable Respiratory status: spontaneous breathing and respiratory function stable Cardiovascular status: stable Anesthetic complications: no    Last Vitals:  Filed Vitals:   12/24/15 1750 12/24/15 1805  BP: 141/64 138/61  Pulse: 85 83  Temp:    Resp: 15 19    Last Pain:  Filed Vitals:   12/24/15 1818  PainSc: [place]

## 2015-12-24 NOTE — Anesthesia Procedure Notes (Signed)
Anesthesia Regional Block:  Adductor canal block  Pre-Anesthetic Checklist: ,, timeout performed, Correct Patient, Correct Site, Correct Laterality, Correct Procedure, Correct Position, site marked, Risks and benefits discussed,  Surgical consent,  Pre-op evaluation,  At surgeon's request and post-op pain management  Laterality: Left  Prep: chloraprep       Needles:  Injection technique: Single-shot  Needle Type: Echogenic Stimulator Needle     Needle Length: 9cm 9 cm Needle Gauge: 21 and 21 G    Additional Needles:  Procedures: ultrasound guided (picture in chart) Adductor canal block Narrative:  Injection made incrementally with aspirations every 5 mL.  Performed by: Personally  Anesthesiologist: Shelisha Gautier  Additional Notes: Risks, benefits and alternative to block explained extensively.  Patient tolerated procedure well, without complications.

## 2015-12-24 NOTE — Anesthesia Preprocedure Evaluation (Addendum)
Anesthesia Evaluation  Patient identified by MRN, date of birth, ID band Patient awake    Reviewed: Allergy & Precautions, H&P , NPO status , Patient's Chart, lab work & pertinent test results  History of Anesthesia Complications Negative for: history of anesthetic complications  Airway Mallampati: II  TM Distance: >3 FB Neck ROM: full    Dental no notable dental hx.    Pulmonary shortness of breath, pneumonia, Current Smoker,    Pulmonary exam normal breath sounds clear to auscultation       Cardiovascular hypertension, Normal cardiovascular exam Rhythm:regular Rate:Normal  2013 echo with normal EF, mild MR, normal RV function and normal PA pressures Takes metoprolol to control palpitations   Neuro/Psych  Headaches, PSYCHIATRIC DISORDERS Anxiety Depression  Neuromuscular disease    GI/Hepatic Neg liver ROS, GERD  ,  Endo/Other  diabetes  Renal/GU negative Renal ROS     Musculoskeletal  (+) Arthritis ,   Abdominal   Peds  Hematology negative hematology ROS (+)   Anesthesia Other Findings Denies blood thinning meds besides aspirin, no difficulties with past anesthetics, denies cardiac or pulmonary issues  Reproductive/Obstetrics negative OB ROS                          Anesthesia Physical Anesthesia Plan  ASA: III  Anesthesia Plan: Regional and General   Post-op Pain Management: GA combined w/ Regional for post-op pain   Induction: Intravenous  Airway Management Planned: LMA  Additional Equipment:   Intra-op Plan:   Post-operative Plan:   Informed Consent: I have reviewed the patients History and Physical, chart, labs and discussed the procedure including the risks, benefits and alternatives for the proposed anesthesia with the patient or authorized representative who has indicated his/her understanding and acceptance.   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

## 2015-12-24 NOTE — H&P (Signed)
TOTAL KNEE ADMISSION H&P  Patient is being admitted for left total knee arthroplasty.  Subjective:  Chief Complaint:left knee pain.  HPI: Amanda [name], 65 y.o. female, has a history of pain and functional disability in the left knee due to arthritis and has failed non-surgical conservative treatments for greater than 12 weeks to includeNSAID's and/or analgesics, use of assistive devices and activity modification.  Onset of symptoms was gradual, starting >10 years ago with gradually worsening course since that time. The patient noted no past surgery on the left knee(s).  Patient currently rates pain in the left knee(s) at 9 out of 10 with activity. Patient has night pain, worsening of pain with activity and weight bearing, pain that interferes with activities of daily living, pain with passive range of motion, crepitus and joint swelling.  Patient has evidence of subchondral sclerosis and joint space narrowing by imaging studies. This patient has had malignancy in the knee joint, the distal femur or the proximal tibia and worsening sxs interfering with ADLs. There is no active infection.  Patient Active Problem List   Diagnosis Date Noted  . Left facial pain 10/29/2015  . Memory loss 10/29/2015  . Palpitations 07/18/2015  . Osteoarthritis Knees, bilateral, moderate 06/16/2015  . Smoker 05/10/2015  . Solitary pulmonary nodule 03/28/2013  . Cardiac murmur 03/15/2012  . Hypertension   . UNSPECIFIED VITAMIN D DEFICIENCY 05/29/2009  . Pure hypercholesterolemia 12/07/2007  . DEPRESSIVE DISORDER, RCR, SEVERE 12/07/2007  . Diabetes mellitus type 2, controlled ([place]) 08/10/2007   Past Medical History  Diagnosis Date  . Depression   . Anxiety   . Hyperlipidemia   . GERD (gastroesophageal reflux disease)   . Tobacco abuse   . Smoker 05/10/2015  . Degenerative arthritis of knee, bilateral 06/16/2015  . Pneumonia     hx of this several times  . Heart murmur     slight per pt- benign   .  Hypertension     hx of tachycardia   . History of tachycardia   . Thyroid disease     hyperthyroidism as a teenager - received some injections age 76 that corrected this   . Constipation     uses docusate and fiber  . Diabetes ([place])     diet managed-type 11  . Tachycardia   . Shortness of breath dyspnea     with exertion  . Headache   . 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 Tonsillectomy      4 yoa  . Ovarian cyst removal  1972  . Abdominal hysterectomy  1993  . Wrist fracture surgery  11/2008  . Esophageal dilation      x 3  . Colonoscopy      30 years ago was normal per pt.   . Fractured leg Right   . Salivary gland surgery    . Cholecystectomy    . Back surgery      No prescriptions prior to admission   Allergies  Allergen Reactions  . Diazepam Other (See Comments)    REACTION: pt states she gets very angry and abusive verbally on this medication     Social History  Substance Use Topics  . Smoking status: Current Every Day Smoker -- 1.00 packs/day for 45 years    Types: Cigarettes  . Smokeless tobacco: Never Used  . Alcohol Use: 0.0 oz/week    0 Standard drinks or  equivalent per week     Comment: glass of wine twice a month     Family History  Problem Relation Age of Onset  . Hyperlipidemia Mother   . Arthritis Mother   . Diabetes Mother   . Heart disease Mother   . Colon polyps Mother   . Cancer Paternal Grandmother     breast  . Hypercholesterolemia Sister   . Colon polyps Sister   . Colon cancer Neg Hx   . Esophageal cancer Neg Hx   . Rectal cancer Neg Hx   . Stomach cancer Neg Hx      Review of Systems  Constitutional: Negative.   HENT: Negative.   Eyes: Negative.   Respiratory: Negative.   Cardiovascular: Negative.   Gastrointestinal: Negative.   Genitourinary: Negative.   Musculoskeletal: Positive for joint pain.  Skin: Negative.   Neurological:  Negative.   Endo/Heme/Allergies: Negative.   Psychiatric/Behavioral: Negative.     Objective:  Physical Exam  Constitutional: She appears well-developed.  HENT:  Head: Normocephalic.  Eyes: Pupils are equal, round, and reactive to light.  Neck: Normal range of motion.  Cardiovascular: Normal rate.   Respiratory: Effort normal.  Neurological: She is alert.  Skin: Skin is warm.  Psychiatric: She has a normal mood and affect.  left knee skin ok - dp 2/4 - ankle df pf ok - collaterals stable - ext mech ok - rom 7 - 105  Vital signs in last 24 hours:    Labs:   Estimated body mass index is 31.94 kg/(m^2) as calculated from the following:   Height as of 11/20/15: 5\' 8"  (1.727 m).   Weight as of 11/20/15: 95.255 kg (210 lb).   Imaging Review Plain radiographs demonstrate moderate degenerative joint disease of the left knee(s). The overall alignment ismild varus. The bone quality appears to be good for age and reported activity level.  Assessment/Plan:  End stage arthritis, left knee   The patient history, physical examination, clinical judgment of the provider and imaging studies are consistent with end stage degenerative joint disease of the left knee(s) and total knee arthroplasty is deemed medically necessary. The treatment options including medical management, injection therapy arthroscopy and arthroplasty were discussed at length. The risks and benefits of total knee arthroplasty were presented and reviewed. The risks due to aseptic loosening, infection, stiffness, patella tracking problems, thromboembolic complications and other imponderables were discussed. The patient acknowledged the explanation, agreed to proceed with the plan and consent was signed. Patient is being admitted for inpatient treatment for surgery, pain control, PT, OT, prophylactic antibiotics, VTE prophylaxis, progressive ambulation and ADL's and discharge planning. The patient is planning to be discharged  home with home health services

## 2015-12-25 ENCOUNTER — Encounter (HOSPITAL_COMMUNITY): Payer: Self-pay | Admitting: Orthopedic Surgery

## 2015-12-25 LAB — BASIC METABOLIC PANEL
Anion gap: 4 — ABNORMAL LOW (ref 5–15)
BUN: 8 mg/dL (ref 6–20)
CO2: 26 mmol/L (ref 22–32)
Calcium: 8.6 mg/dL — ABNORMAL LOW (ref 8.9–10.3)
Chloride: 106 mmol/L (ref 101–111)
Creatinine, Ser: 0.73 mg/dL (ref 0.44–1.00)
GFR calc Af Amer: >60 mL/min (ref >60)
GFR calc non Af Amer: >60 mL/min (ref >60)
Glucose, Bld: 119 mg/dL — ABNORMAL HIGH (ref 65–99)
Potassium: 4.1 mmol/L (ref 3.5–5.1)
Sodium: 136 mmol/L (ref 135–145)

## 2015-12-25 LAB — CBC
HCT: 30.6 % — ABNORMAL LOW (ref 36.0–46.0)
Hemoglobin: 10.2 g/dL — ABNORMAL LOW (ref 12.0–15.0)
MCH: 34.2 pg — ABNORMAL HIGH (ref 26.0–34.0)
MCHC: 33.3 g/dL (ref 30.0–36.0)
MCV: 102.7 fL — ABNORMAL HIGH (ref 78.0–100.0)
Platelets: 157 10*3/uL (ref 150–400)
RBC: 2.98 MIL/uL — ABNORMAL LOW (ref 3.87–5.11)
RDW: 11.4 % — ABNORMAL LOW (ref 11.5–15.5)
WBC: 10.4 10*3/uL (ref 4.0–10.5)

## 2015-12-25 LAB — GLUCOSE, CAPILLARY
Glucose-Capillary: 121 mg/dL — ABNORMAL HIGH (ref 65–99)
Glucose-Capillary: 127 mg/dL — ABNORMAL HIGH (ref 65–99)
Glucose-Capillary: 136 mg/dL — ABNORMAL HIGH (ref 65–99)
Glucose-Capillary: 152 mg/dL — ABNORMAL HIGH (ref 65–99)

## 2015-12-25 LAB — PROTIME-INR
INR: 1.17 (ref 0.00–1.49)
Prothrombin Time: 15.1 seconds (ref 11.6–15.2)

## 2015-12-25 MED ORDER — WARFARIN VIDEO
Freq: Once | Status: AC
  Administered 2015-12-25: 16:00:00

## 2015-12-25 MED ORDER — WARFARIN SODIUM 5 MG PO TABS
5.0000 mg | ORAL_TABLET | Freq: Once | ORAL | Status: AC
Start: 2015-12-25 — End: 2015-12-25
  Administered 2015-12-25: 5 mg via ORAL
  Filled 2015-12-25: qty 1

## 2015-12-25 MED ORDER — COUMADIN BOOK
Freq: Once | Status: AC
  Administered 2015-12-25: 16:00:00
  Filled 2015-12-25: qty 1

## 2015-12-25 NOTE — Evaluation (Signed)
Physical Therapy Evaluation Patient Details Name: Amanda [name] MRN: [id number] DOB: 1951/02/27 Today's Date: 12/25/2015   History of Present Illness  65 y.o. female now s/p Lt TKA. PMH: depression, anxiety, smoker, diabetes.  Clinical Impression  Pt is s/p TKA resulting in the deficits listed below (see PT Problem List).  Pt will benefit from skilled PT to increase their independence and safety with mobility to allow discharge to the venue listed below. At this time the patient was able to ambulate 4 feet with rw and required moderate assistance with transfers. At this time PT is recommending SNF at D/C for further rehabilitation prior to D/C home.      Follow Up Recommendations SNF;Supervision for mobility/OOB    Equipment Recommendations  Other (comment);None recommended by PT (patient reports having rw at home)    Recommendations for Other Services       Precautions / Restrictions Precautions Precautions: Knee;Fall Precaution Booklet Issued: Yes (comment) Precaution Comments: HEP provided, reviewed no pillows under knee Required Braces or Orthoses: Knee Immobilizer - Left Knee Immobilizer - Left: On when out of bed or walking Restrictions Weight Bearing Restrictions: Yes LLE Weight Bearing: Weight bearing as tolerated      Mobility  Bed Mobility Overal bed mobility: Needs Assistance Bed Mobility: Supine to Sit     Supine to sit: Mod assist     General bed mobility comments: needing assistance with Lt LE and trunk.   Transfers Overall transfer level: Needs assistance Equipment used: Rolling walker (2 wheeled) Transfers: Sit to/from Stand Sit to Stand: Mod assist;From elevated surface (requiring 2 attempts to achieve standing)         General transfer comment: reminder for hand placement, patient reports difficulty pushing with Lt wrist due to prior injury.  Ambulation/Gait Ambulation/Gait assistance: Min assist Ambulation Distance (Feet): 4  Feet Assistive device: Rolling walker (2 wheeled) Gait Pattern/deviations: Step-to pattern Gait velocity: decreased   General Gait Details: cues for sequence, mild instability noted.   Stairs            Wheelchair Mobility    Modified Rankin (Stroke Patients Only)       Balance Overall balance assessment: Needs assistance Sitting-balance support: No upper extremity supported Sitting balance-Leahy Scale: Fair     Standing balance support: Bilateral upper extremity supported Standing balance-Leahy Scale: Poor Standing balance comment: using rw                             Pertinent Vitals/Pain Pain Assessment: 0-10 Pain Score: 8  Pain Location: knee LT Pain Descriptors / Indicators: Aching Pain Intervention(s): Limited activity within patient's tolerance;Monitored during session    Home Living Family/patient expects to be discharged to:: Skilled nursing facility Living Arrangements: Spouse/significant other;Children   Type of Home: House Home Access: Stairs to enter Entrance Stairs-Rails: Right Entrance Stairs-Number of Steps: 3 Home Layout: One level Home Equipment: Walker - 2 wheels Additional Comments: Patient states that her husband will not be able to assist her at home.     Prior Function Level of Independence: Independent               Hand Dominance        Extremity/Trunk Assessment               Lower Extremity Assessment: LLE deficits/detail   LLE Deficits / Details: poor quad activation     Communication   Communication: No difficulties  Cognition Arousal/Alertness: Awake/alert  Behavior During Therapy: WFL for tasks assessed/performed Overall Cognitive Status: Within Functional Limits for tasks assessed                      General Comments      Exercises Total Joint Exercises Ankle Circles/Pumps: AROM;Both;10 reps Quad Sets: Strengthening;Left;10 reps Heel Slides: AAROM;Left;10 reps Goniometric  ROM: 43 degrees flexion      Assessment/Plan    PT Assessment Patient needs continued PT services  PT Diagnosis Difficulty walking;Generalized weakness;Acute pain   PT Problem List Decreased strength;Decreased range of motion;Decreased activity tolerance;Decreased balance;Decreased mobility;Pain  PT Treatment Interventions DME instruction;Gait training;Stair training;Functional mobility training;Therapeutic activities;Therapeutic exercise;Balance training;Patient/family education   PT Goals (Current goals can be found in the Care Plan section) Acute Rehab PT Goals Patient Stated Goal: to get more therapy before going home PT Goal Formulation: With patient Time For Goal Achievement: 01/08/16 Potential to Achieve Goals: Good    Frequency 7X/week   Barriers to discharge Decreased caregiver support      Co-evaluation               End of Session Equipment Utilized During Treatment: Gait belt;Left knee immobilizer Activity Tolerance: Patient limited by pain Patient left: in chair;with call bell/phone within reach;Other (comment) (in knee extension ) Nurse Communication: Mobility status;Precautions;Weight bearing status         Time: [id number] PT Time Calculation (min) (ACUTE ONLY): 33 min   Charges:   PT Evaluation $PT Eval Moderate Complexity: 1 Procedure PT Treatments $Therapeutic Activity: 8-22 mins   PT G Codes:        Cassell Clement, PT, CSCS Pager [phone] Office [phone]  12/25/2015, 2:28 PM

## 2015-12-25 NOTE — Progress Notes (Signed)
ANTICOAGULATION CONSULT NOTE - Follow Up Consult  Pharmacy Consult for Coumadin Indication: VTE prophylaxis  Allergies  Allergen Reactions  . Diazepam Other (See Comments)    REACTION: pt states she gets very angry and abusive verbally on this medication     Patient Measurements: Height: 5\' 8"  (172.7 cm) Weight: 208 lb (94.348 kg) IBW/kg (Calculated) : 63.9  Vital Signs: Temp: 98.1 F (36.7 C) (01/18 0524) Temp Source: Oral (01/18 0524) BP: 100/58 mmHg (01/18 0524) Pulse Rate: 71 (01/18 0524)  Labs:  Recent Labs  12/24/15 2108 12/25/15 0705  HGB  --  10.2*  HCT  --  30.6*  PLT  --  157  LABPROT 14.4 15.1  INR 1.10 1.17  CREATININE  --  0.73    Estimated Creatinine Clearance: 84.2 mL/min (by C-G formula based on Cr of 0.73).  Assessment: 64yof POD #1 left TKA was started on coumadin for VTE prophylaxis. INR 1.17 after first dose of 5mg . CBC stable. No bleeding issues.  Goal of Therapy:  INR 2-3 Monitor platelets by anticoagulation protocol: Yes   Plan:  1) Repeat coumadin 5mg  x 1 2) Daily INR  Deboraha Sprang 12/25/2015,10:44 AM

## 2015-12-25 NOTE — Discharge Instructions (Signed)
Information on my medicine - Coumadin   (Warfarin)  This medication education was reviewed with me or my healthcare representative as part of my discharge preparation.  The pharmacist that spoke with me during my hospital stay was:  Deboraha Sprang, [facility]  Why was Coumadin prescribed for you? Coumadin was prescribed for you because you have a blood clot or a medical condition that can cause an increased risk of forming blood clots. Blood clots can cause serious health problems by blocking the flow of blood to the heart, lung, or brain. Coumadin can prevent harmful blood clots from forming. As a reminder your indication for Coumadin is:   Blood Clot Prevention After Orthopedic Surgery  What test will check on my response to Coumadin? While on Coumadin (warfarin) you will need to have an INR test regularly to ensure that your dose is keeping you in the desired range. The INR (international normalized ratio) number is calculated from the result of the laboratory test called prothrombin time (PT).  If an INR APPOINTMENT HAS NOT ALREADY BEEN MADE FOR YOU please schedule an appointment to have this lab work done by your health care provider within 7 days. Your INR goal is usually a number between:  2 to 3 or your provider may give you a more narrow range like 2-2.5.  Ask your health care provider during an office visit what your goal INR is.  What  do you need to  know  About  COUMADIN? Take Coumadin (warfarin) exactly as prescribed by your healthcare provider about the same time each day.  DO NOT stop taking without talking to the doctor who prescribed the medication.  Stopping without other blood clot prevention medication to take the place of Coumadin may increase your risk of developing a new clot or stroke.  Get refills before you run out.  What do you do if you miss a dose? If you miss a dose, take it as soon as you remember on the same day then continue your regularly scheduled regimen the  next day.  Do not take two doses of Coumadin at the same time.  Important Safety Information A possible side effect of Coumadin (Warfarin) is an increased risk of bleeding. You should call your healthcare provider right away if you experience any of the following: ? Bleeding from an injury or your nose that does not stop. ? Unusual colored urine (red or dark brown) or unusual colored stools (red or black). ? Unusual bruising for unknown reasons. ? A serious fall or if you hit your head (even if there is no bleeding).  Some foods or medicines interact with Coumadin (warfarin) and might alter your response to warfarin. To help avoid this: ? Eat a balanced diet, maintaining a consistent amount of Vitamin K. ? Notify your provider about major diet changes you plan to make. ? Avoid alcohol or limit your intake to 1 drink for women and 2 drinks for men per day. (1 drink is 5 oz. wine, 12 oz. beer, or 1.5 oz. liquor.)  Make sure that ANY health care provider who prescribes medication for you knows that you are taking Coumadin (warfarin).  Also make sure the healthcare provider who is monitoring your Coumadin knows when you have started a new medication including herbals and non-prescription products.  Coumadin (Warfarin)  Major Drug Interactions  Increased Warfarin Effect Decreased Warfarin Effect  Alcohol (large quantities) Antibiotics (esp. Septra/Bactrim, Flagyl, Cipro) Amiodarone (Cordarone) Aspirin (ASA) Cimetidine (Tagamet) Megestrol (Megace) NSAIDs (ibuprofen,  naproxen, etc.) °Piroxicam (Feldene) °Propafenone (Rythmol SR) °Propranolol (Inderal) °Isoniazid (INH) °Posaconazole (Noxafil) Barbiturates (Phenobarbital) °Carbamazepine (Tegretol) °Chlordiazepoxide (Librium) °Cholestyramine (Questran) °Griseofulvin °Oral Contraceptives °Rifampin °Sucralfate (Carafate) °Vitamin K  ° °Coumadin® (Warfarin) Major Herbal Interactions  °Increased Warfarin Effect Decreased Warfarin Effect   °Garlic °Ginseng °Ginkgo biloba Coenzyme Q10 °Green tea °St. John’s wort   ° °Coumadin® (Warfarin) FOOD Interactions  °Eat a consistent number of servings per week of foods HIGH in Vitamin K °(1 serving = ½ cup)  °Collards (cooked, or boiled & drained) °Kale (cooked, or boiled & drained) °Mustard greens (cooked, or boiled & drained) °Parsley *serving size only = ¼ cup °Spinach (cooked, or boiled & drained) °Swiss chard (cooked, or boiled & drained) °Turnip greens (cooked, or boiled & drained)  °Eat a consistent number of servings per week of foods MEDIUM-HIGH in Vitamin K °(1 serving = 1 cup)  °Asparagus (cooked, or boiled & drained) °Broccoli (cooked, boiled & drained, or raw & chopped) °Brussel sprouts (cooked, or boiled & drained) *serving size only = ½ cup °Lettuce, raw (green leaf, endive, romaine) °Spinach, raw °Turnip greens, raw & chopped  ° °These websites have more information on Coumadin (warfarin):  www.coumadin.com; °www.ahrq.gov/consumer/coumadin.htm; ° ° °

## 2015-12-25 NOTE — Progress Notes (Signed)
Scheduled procedure is on Medicare IP only list.  Will need order for INPATIENT post-op. Thanks you  

## 2015-12-25 NOTE — Progress Notes (Signed)
Subjective: Pt stable - pain ok - reports right hand rash   Objective: Vital signs in last 24 hours: Temp:  [97.7 F (36.5 C)-98.3 F (36.8 C)] 98.1 F (36.7 C) (01/18 0524) Pulse Rate:  [67-90] 71 (01/18 0524) Resp:  [10-22] 19 (01/18 0524) BP: (100-145)/(34-65) 100/58 mmHg (01/18 0524) SpO2:  [94 %-100 %] 96 % (01/18 0524) Weight:  [94.348 kg (208 lb)] 94.348 kg (208 lb) (01/17 1211)  Intake/Output from previous day: 01/17 0701 - 01/18 0700 In: 1881.3 [I.V.:1881.3] Out: 50 [Blood:50] Intake/Output this shift:    Exam:  Intact pulses distally Dorsiflexion/Plantar flexion intact  Labs: No results for input(s): HGB in the last 72 hours. No results for input(s): WBC, RBC, HCT, PLT in the last 72 hours. No results for input(s): NA, K, CL, CO2, BUN, CREATININE, GLUCOSE, CALCIUM in the last 72 hours.  Recent Labs  12/24/15 2108  INR 1.10    Assessment/Plan: Plan CPM and PT today - dorsal hand rash looks allergic - will recheck tomorrow - nothing on hand currently   DEAN,GREGORY SCOTT 12/25/2015, 7:14 AM

## 2015-12-25 NOTE — Progress Notes (Signed)
PT Treatment  12/25/15 1601  PT Visit Information  Last PT Received On 12/25/15  Assistance Needed +1  History of Present Illness 65 y.o. female now s/p Lt TKA. PMH: depression, anxiety, smoker, diabetes.  PT Time Calculation  PT Start Time (ACUTE ONLY) 1501  PT Stop Time (ACUTE ONLY) 1519  PT Time Calculation (min) (ACUTE ONLY) 18 min  Subjective Data  Subjective Knee is really sore, having trouble getting pain controlled.   Patient Stated Goal to get more therapy before going home  Precautions  Precautions Knee;Fall  Required Braces or Orthoses Knee Immobilizer - Left  Knee Immobilizer - Left On when out of bed or walking  Restrictions  Weight Bearing Restrictions Yes  LLE Weight Bearing WBAT  Pain Assessment  Pain Assessment 0-10  Pain Score 8  Pain Location Lt knee  Pain Descriptors / Indicators Sharp;Aching  Pain Intervention(s) Limited activity within patient's tolerance;Monitored during session;Premedicated before session  Cognition  Arousal/Alertness Awake/alert  Behavior During Therapy WFL for tasks assessed/performed  Overall Cognitive Status Within Functional Limits for tasks assessed  Bed Mobility  Overal bed mobility Needs Assistance  Bed Mobility Supine to Sit  Supine to sit Mod assist  General bed mobility comments assist provided with LLE and min assist at trunk.   Transfers  Overall transfer level Needs assistance  Equipment used Rolling walker (2 wheeled)  Transfers Sit to/from Stand  Sit to Stand Mod assist;From elevated surface  General transfer comment reminder for hand placement prior to standing and sitting.   Ambulation/Gait  Ambulation/Gait assistance Min guard  Ambulation Distance (Feet) 25 Feet  Assistive device Rolling walker (2 wheeled)  Gait Pattern/deviations Step-to pattern;Decreased step length - left;Decreased step length - right;Decreased weight shift to left  General Gait Details patient moving very slowly with ambulation which is  reportedly due to pain.   Gait velocity decreased  Balance  Overall balance assessment Needs assistance  Sitting-balance support No upper extremity supported  Sitting balance-Leahy Scale Fair  Standing balance support Bilateral upper extremity supported  Standing balance-Leahy Scale Poor  Standing balance comment using rw  Total Joint Exercises  Ankle Circles/Pumps AROM;Both;10 reps  Quad Sets Strengthening;Left;10 reps  Heel Slides AAROM;Left;10 reps  PT - End of Session  Equipment Utilized During Treatment Gait belt;Left knee immobilizer  Activity Tolerance Patient limited by pain  Patient left in chair;with call bell/phone within reach;with family/visitor present  Nurse Communication Mobility status;Precautions;Weight bearing status  PT - Assessment/Plan  PT Plan Current plan remains appropriate  PT Frequency (ACUTE ONLY) 7X/week  Follow Up Recommendations SNF;Supervision for mobility/OOB  PT equipment None recommended by PT  PT Goal Progression  Progress towards PT goals Progressing toward goals  Acute Rehab PT Goals  PT Goal Formulation With patient  Time For Goal Achievement 01/08/16  Potential to Achieve Goals Good  PT General Charges  $$ ACUTE PT VISIT 1 Procedure  PT Treatments  $Gait Training 8-22 mins  Patient able to increase her ambulation to 25 feet during second session. Mobility is slow and reportedly limited by pain. PT to continue to follow and progress mobility and safety as tolerated.   Cassell Clement, PT, [place] Pager [phone] Office [phone]

## 2015-12-26 LAB — PROTIME-INR
INR: 2.17 — ABNORMAL HIGH (ref 0.00–1.49)
INR: 2.58 — ABNORMAL HIGH (ref 0.00–1.49)
Prothrombin Time: 24 seconds — ABNORMAL HIGH (ref 11.6–15.2)
Prothrombin Time: 27.3 seconds — ABNORMAL HIGH (ref 11.6–15.2)

## 2015-12-26 LAB — GLUCOSE, CAPILLARY
Glucose-Capillary: 112 mg/dL — ABNORMAL HIGH (ref 65–99)
Glucose-Capillary: 110 mg/dL — ABNORMAL HIGH (ref 65–99)
Glucose-Capillary: 89 mg/dL (ref 65–99)
Glucose-Capillary: 107 mg/dL — ABNORMAL HIGH (ref 65–99)

## 2015-12-26 MED ORDER — OXYCODONE HCL 5 MG PO TABS: 5.0000 mg | ORAL_TABLET | ORAL | Status: DC | PRN

## 2015-12-26 MED ORDER — RIVAROXABAN 10 MG PO TABS: 10.0000 mg | ORAL_TABLET | Freq: Every day | ORAL | Status: DC

## 2015-12-26 NOTE — Evaluation (Signed)
Occupational Therapy Evaluation Patient Details Name: Amanda [name] MRN: [id number] DOB: 06-29-1951 Today's Date: 12/26/2015    History of Present Illness 65 y.o. female now s/p Lt TKA. PMH: depression, anxiety, smoker, diabetes.   Clinical Impression   Pt reports she was independent with ADLs PTA. Currently pt is overall min assist for ADLs and functional mobility with the exception of mod assist for LB dressing. Began safety and ADL education with pt; she verbalized understanding. Pt planning d/c to SNF for further rehab prior to returning home; agree with SNF placement. Pt would benefit from continued skilled OT to maximize independence and safety with LB ADLs using AE, toilet transfers, and grooming activities in standing.    Follow Up Recommendations  SNF;Supervision - Intermittent    Equipment Recommendations  3 in 1 bedside comode    Recommendations for Other Services       Precautions / Restrictions Precautions Precautions: Knee;Fall Required Braces or Orthoses: Knee Immobilizer - Left Knee Immobilizer - Left: On when out of bed or walking Restrictions Weight Bearing Restrictions: Yes LLE Weight Bearing: Weight bearing as tolerated      Mobility Bed Mobility Overal bed mobility: Needs Assistance Bed Mobility: Supine to Sit;Sit to Supine     Supine to sit: Min assist Sit to supine: Min assist   General bed mobility comments: Min assist to manage LLE. Use of bed rails and HOB slightly elevated.   Transfers Overall transfer level: Needs assistance Equipment used: Rolling walker (2 wheeled) Transfers: Sit to/from Stand Sit to Stand: Min assist         General transfer comment: Min assist to boost up. VC for hand placement and pt with good technique.    Balance Overall balance assessment: Needs assistance Sitting-balance support: No upper extremity supported;Feet supported Sitting balance-Leahy Scale: Good     Standing balance support: Bilateral  upper extremity supported Standing balance-Leahy Scale: Poor Standing balance comment: RW for support                            ADL Overall ADL's : Needs assistance/impaired Eating/Feeding: Set up;Sitting   Grooming: Minimal assistance;Standing Grooming Details (indicate cue type and reason): Min assist to maintain balance in standing.     Lower Body Bathing: Minimal assistance;Sit to/from stand       Lower Body Dressing: Moderate assistance;Sit to/from stand Lower Body Dressing Details (indicate cue type and reason): Pt with difficulty reaching R foot, unable to reach L foot. Educated on compensatory strategies for LB ADLs. Toilet Transfer: Minimal assistance;Ambulation;BSC;RW (BSC over toilet) Toilet Transfer Details (indicate cue type and reason): Simulated with sit to stand from [place] and Hygiene: Minimal assistance;Sit to/from stand Toileting - Clothing Manipulation Details (indicate cue type and reason): Min A for balance in standing.     Functional mobility during ADLs: Minimal assistance;Rolling walker General ADL Comments: Pts close friend present for OT eval. Educated on safety with RW, using 3 in 1 over toilet with mobility to bathroom, ice for edema and pain; pt verbalized understanding.     Vision     Perception     Praxis      Pertinent Vitals/Pain Pain Assessment: 0-10 Pain Score: 8  Pain Location: L knee Pain Descriptors / Indicators: Aching Pain Intervention(s): Limited activity within patient's tolerance;Monitored during session;Ice applied     Hand Dominance     Extremity/Trunk Assessment Upper Extremity Assessment Upper Extremity Assessment: Overall WFL for tasks  assessed   Lower Extremity Assessment Lower Extremity Assessment: Defer to PT evaluation   Cervical / Trunk Assessment Cervical / Trunk Assessment: Normal   Communication Communication Communication: No difficulties   Cognition  Arousal/Alertness: Awake/alert Behavior During Therapy: WFL for tasks assessed/performed Overall Cognitive Status: Within Functional Limits for tasks assessed                     General Comments       Exercises       Shoulder Instructions      Home Living Family/patient expects to be discharged to:: Skilled nursing facility Living Arrangements: Spouse/significant other;Children   Type of Home: House Home Access: Stairs to enter [employer] of Steps: 3 Entrance Stairs-Rails: Right Home Layout: One level         Bathroom Toilet: Standard     Home Equipment: Environmental consultant - 2 wheels;Adaptive equipment Adaptive Equipment: Reacher;Sock aid;Long-handled shoe horn;Long-handled sponge Additional Comments: Patient states that her husband will not be able to assist her at home.       Prior Functioning/Environment Level of Independence: Independent             OT Diagnosis: Acute pain   OT Problem List: Decreased strength;Decreased range of motion;Decreased activity tolerance;Impaired balance (sitting and/or standing);Decreased knowledge of use of DME or AE;Decreased knowledge of precautions;Pain   OT Treatment/Interventions: Self-care/ADL training;Energy conservation;DME and/or AE instruction;Patient/family education    OT Goals(Current goals can be found in the care plan section) Acute Rehab OT Goals Patient Stated Goal: more therapy then home OT Goal Formulation: With patient Time For Goal Achievement: 01/09/16 Potential to Achieve Goals: Good ADL Goals Pt Will Perform Grooming: with supervision;standing Pt Will Perform Lower Body Bathing: with supervision;with adaptive equipment;sit to/from stand Pt Will Perform Lower Body Dressing: with supervision;with adaptive equipment;sit to/from stand Pt Will Transfer to Toilet: with supervision;ambulating;bedside commode (over toilet) Pt Will Perform Toileting - Clothing Manipulation and hygiene: with  supervision;sit to/from stand  OT Frequency: Min 2X/week   Barriers to D/C: Decreased caregiver support  Daughter works during the day and husband unable to assist with pt       Co-evaluation              End of Session Equipment Utilized During Treatment: Gait belt;Rolling walker CPM Left Knee CPM Left Knee: Off Nurse Communication: Other (comment) (Pt would like CPM on)  Activity Tolerance: Patient tolerated treatment well Patient left: in bed;with call bell/phone within reach;with family/visitor present   Time: [id number] OT Time Calculation (min): 21 min Charges:  OT General Charges $OT Visit: 1 Procedure OT Evaluation $OT Eval Moderate Complexity: 1 Procedure G-Codes:     Binnie Kand M.S., OTR/L Pager: [phone]  12/26/2015, 3:22 PM

## 2015-12-26 NOTE — Progress Notes (Signed)
Subjective: Patient stable moving in room generally fairly well   Objective: Vital signs in last 24 hours: Temp:  [98.7 F (37.1 C)-99.2 F (37.3 C)] 99.2 F (37.3 C) (01/19 0514) Pulse Rate:  [84-102] 89 (01/19 0514) Resp:  [18] 18 (01/19 0514) BP: (116-150)/(53-55) 133/53 mmHg (01/19 0514) SpO2:  [94 %-96 %] 94 % (01/19 0514)  Intake/Output from previous day: 01/18 0701 - 01/19 0700 In: 600 [P.O.:600] Out: -  Intake/Output this shift:    Exam:  Sensation intact distally Intact pulses distally  Labs:  Recent Labs  12/25/15 0705  HGB 10.2*    Recent Labs  12/25/15 0705  WBC 10.4  RBC 2.98*  HCT 30.6*  PLT 157    Recent Labs  12/25/15 0705  NA 136  K 4.1  CL 106  CO2 26  BUN 8  CREATININE 0.73  GLUCOSE 119*  CALCIUM 8.6*    Recent Labs  12/25/15 0705 12/26/15 0624  INR 1.17 2.17*    Assessment/Plan: Patient's making good progress towards goal of being discharged tomorrow to skilled nursing facility. Okay to remove Ace wrap and web roll today but leave Mepilex in place need to recheck INR tomorrow to make sure the bump is not too much on 5 mg per day   DEAN,GREGORY SCOTT 12/26/2015, 8:14 AM

## 2015-12-26 NOTE — Progress Notes (Signed)
Pt with noted redness to right top of hand; pt reports redness was first noticed yesterday accompanied by itching; pt reports was given Bendaryl which relieved itching; pt denies itchiness at this time; affected hand is vascularly intact; RN will continue to monitor for changes

## 2015-12-26 NOTE — Anesthesia Postprocedure Evaluation (Signed)
Anesthesia Post Note  Patient: Amanda [name]  Procedure(s) Performed: Procedure(s) (LRB): LEFT TOTAL KNEE ARTHROPLASTY (Left)  Patient location during evaluation: PACU Anesthesia Type: General and Regional Level of consciousness: awake Pain management: pain level controlled Vital Signs Assessment: post-procedure vital signs reviewed and stable Respiratory status: spontaneous breathing and respiratory function stable Cardiovascular status: stable Postop Assessment: no signs of nausea or vomiting Anesthetic complications: no    Last Vitals:  Filed Vitals:   12/26/15 0514 12/26/15 1043  BP: 133/53 133/53  Pulse: 89   Temp: 37.3 C   Resp: 18     Last Pain:  Filed Vitals:   12/26/15 1043  PainSc: 5                  Nyhla Mountjoy

## 2015-12-26 NOTE — Discharge Summary (Signed)
Physician Discharge Summary  Patient ID: Amanda [name] MRN: [id number] DOB/AGE: [birth year] 65 y.o.  Admit date: 12/24/2015 Discharge date: 12/27/2015  Admission Diagnoses:  Active Problems:   Degenerative arthritis of left knee   Discharge Diagnoses:  Same  Surgeries: Procedure(s): LEFT TOTAL KNEE ARTHROPLASTY on 12/24/2015   Consultants:    Discharged Condition: Stable  Hospital Course: Amanda [name] is an 65 y.o. female who was admitted 12/24/2015 with a chief complaint of left knee pain, and found to have a diagnosis of severe degenerative arthritis of left knee.  They were brought to the operating room on 12/24/2015 and underwent the above named procedures. Patient tolerated procedure well was seen by physical therapy for postop mobilization started on Coumadin for DVT prophylaxis made a reasonable recovery but will go to skilled nursing to continue recovery with daily physical therapy and gait training    Antibiotics given:  Anti-infectives    Start     Dose/Rate Route Frequency Ordered Stop   12/24/15 2030  ceFAZolin (ANCEF) IVPB 2 g/50 mL premix     2 g 100 mL/hr over 30 Minutes Intravenous Every 6 hours 12/24/15 2009 12/25/15 0243   12/24/15 1400  ceFAZolin (ANCEF) IVPB 2 g/50 mL premix     2 g 100 mL/hr over 30 Minutes Intravenous To [facility] 12/23/15 1332 12/24/15 1445    .  Recent vital signs:  Filed Vitals:   12/26/15 1500 12/26/15 2117  BP: 116/94 129/57  Pulse: 101 116  Temp: 99.1 F (37.3 C) 99.3 F (37.4 C)  Resp: 18 18    Recent laboratory studies:  Results for orders placed or performed during the hospital encounter of 12/24/15  Glucose, capillary  Result Value Ref Range   Glucose-Capillary 108 (H) 65 - 99 mg/dL  Glucose, capillary  Result Value Ref Range   Glucose-Capillary 93 65 - 99 mg/dL  Glucose, capillary  Result Value Ref Range   Glucose-Capillary 146 (H) 65 - 99 mg/dL   Comment 1 Notify RN   Protime-INR  Result Value  Ref Range   Prothrombin Time 15.1 11.6 - 15.2 seconds   INR 1.17 0.00 - 1.49  CBC  Result Value Ref Range   WBC 10.4 4.0 - 10.5 K/uL   RBC 2.98 (L) 3.87 - 5.11 MIL/uL   Hemoglobin 10.2 (L) 12.0 - 15.0 g/dL   HCT 30.6 (L) 36.0 - 46.0 %   MCV 102.7 (H) 78.0 - 100.0 fL   MCH 34.2 (H) 26.0 - 34.0 pg   MCHC 33.3 30.0 - 36.0 g/dL   RDW 11.4 (L) 11.5 - 15.5 %   Platelets 157 150 - 400 K/uL  Basic metabolic panel  Result Value Ref Range   Sodium 136 135 - 145 mmol/L   Potassium 4.1 3.5 - 5.1 mmol/L   Chloride 106 101 - 111 mmol/L   CO2 26 22 - 32 mmol/L   Glucose, Bld 119 (H) 65 - 99 mg/dL   BUN 8 6 - 20 mg/dL   Creatinine, Ser 0.73 0.44 - 1.00 mg/dL   Calcium 8.6 (L) 8.9 - 10.3 mg/dL   GFR calc non Af Amer >60 >60 mL/min   GFR calc Af Amer >60 >60 mL/min   Anion gap 4 (L) 5 - 15  Protime-INR  Result Value Ref Range   Prothrombin Time 14.4 11.6 - 15.2 seconds   INR 1.10 0.00 - 1.49  Glucose, capillary  Result Value Ref Range   Glucose-Capillary 136 (H) 65 - 99 mg/dL  Comment 1 Notify RN    Comment 2 Document in Chart   Glucose, capillary  Result Value Ref Range   Glucose-Capillary 127 (H) 65 - 99 mg/dL  Protime-INR  Result Value Ref Range   Prothrombin Time 24.0 (H) 11.6 - 15.2 seconds   INR 2.17 (H) 0.00 - 1.49  Glucose, capillary  Result Value Ref Range   Glucose-Capillary 152 (H) 65 - 99 mg/dL   Comment 1 Notify RN    Comment 2 Document in Chart   Glucose, capillary  Result Value Ref Range   Glucose-Capillary 121 (H) 65 - 99 mg/dL  Glucose, capillary  Result Value Ref Range   Glucose-Capillary 112 (H) 65 - 99 mg/dL  Protime-INR  Result Value Ref Range   Prothrombin Time 27.3 (H) 11.6 - 15.2 seconds   INR 2.58 (H) 0.00 - 1.49  Glucose, capillary  Result Value Ref Range   Glucose-Capillary 110 (H) 65 - 99 mg/dL  Glucose, capillary  Result Value Ref Range   Glucose-Capillary 89 65 - 99 mg/dL  Glucose, capillary  Result Value Ref Range   Glucose-Capillary 107  (H) 65 - 99 mg/dL    Discharge Medications:     Medication List    STOP taking these medications        aspirin 81 MG tablet     Naproxen Sodium 220 MG Caps     predniSONE 20 MG tablet  Commonly known as:  DELTASONE     traMADol 50 MG tablet  Commonly known as:  ULTRAM      TAKE these medications        amitriptyline 50 MG tablet  Commonly known as:  ELAVIL  Take 1 & 1/2 tablets at night     atorvastatin 40 MG tablet  Commonly known as:  LIPITOR  TAKE ONE TABLET BY MOUTH ONCE DAILY     BENADRYL 25 mg capsule  Generic drug:  diphenhydrAMINE  Take 25 mg by mouth 2 (two) times daily.     clonazePAM 0.5 MG tablet  Commonly known as:  KLONOPIN  TAKE ONE TABLET BY MOUTH TWICE DAILY AS NEEDED     cyclobenzaprine 10 MG tablet  Commonly known as:  FLEXERIL  TAKE ONE TABLET BY MKOUTH THREE TIMES A DAY AS NEEDED FOR MUSCLE SPASMS     esomeprazole 40 MG capsule  Commonly known as:  NEXIUM  Take 1 capsule (40 mg total) by mouth daily before breakfast.     FIBER THERAPY PO  Take 625 mg by mouth as needed.     FLUoxetine 40 MG capsule  Commonly known as:  PROZAC  Take 40-80 mg by mouth as directed. Take 2 capsules on Monday,Wednesday, & Friday Take 1 capsule all other days     magnesium oxide 400 MG tablet  Commonly known as:  MAG-OX  Take 400 mg by mouth daily.     metoprolol tartrate 25 MG tablet  Commonly known as:  LOPRESSOR  TAKE ONE TABLET BY MOUTH TWICE DAILY     multivitamin tablet  Take 1 tablet by mouth daily.     oxyCODONE 5 MG immediate release tablet  Commonly known as:  Oxy IR/ROXICODONE  Take 1-2 tablets (5-10 mg total) by mouth every 3 (three) hours as needed for breakthrough pain.     rivaroxaban 10 MG Tabs tablet  Commonly known as:  XARELTO  Take 1 tablet (10 mg total) by mouth daily.     STOOL SOFTENER PO  Take 100 mg by  mouth daily as needed. For stool softner     vitamin C 1000 MG tablet  Take 1,000 mg by mouth daily.     Vitamin D  1000 units capsule  Take 1,000 Units by mouth 2 (two) times daily.        Diagnostic Studies: Dg Chest 2 View  12/16/2015  CLINICAL DATA:  Preop exam. Scheduled for knee surgery. History of hypertension. EXAM: CHEST  2 VIEW COMPARISON:  03/15/2013. FINDINGS: Cardiac silhouette normal in size and configuration. No mediastinal or hilar masses or evidence of adenopathy. Clear lungs. Stable Bochdalek's hernia at the posterior left lung base. No pleural effusion or pneumothorax. Bony thorax is demineralized but intact. IMPRESSION: No active cardiopulmonary disease. Electronically Signed   By: Lajean Manes M.D.   On: 12/16/2015 14:11    Disposition: Final discharge disposition not confirmed      Discharge Instructions    Call MD / Call 911    Complete by:  As directed   If you experience chest pain or shortness of breath, CALL 911 and be transported to the hospital emergency room.  If you develope a fever above 101 F, pus (white drainage) or increased drainage or redness at the wound, or calf pain, call your surgeon's office.     Constipation Prevention    Complete by:  As directed   Drink plenty of fluids.  Prune juice may be helpful.  You may use a stool softener, such as Colace (over the counter) 100 mg twice a day.  Use MiraLax (over the counter) for constipation as needed.     Diet - low sodium heart healthy    Complete by:  As directed      Discharge instructions    Complete by:  As directed   Weightbearing as tolerated with crutches or walker CPM 6 hours per day for knee range of motion Physical therapy daily for gait training knee range of motion and strengthening Keep incision dry INSTRUCTIONS AFTER JOINT REPLACEMENT   Remove items at home which could result in a fall. This includes throw rugs or furniture in walking pathways ICE to the affected joint every three hours while awake for 30 minutes at a time, for at least the first 3-5 days, and then as needed for pain and swelling.   Continue to use ice for pain and swelling. You may notice swelling that will progress down to the foot and ankle.  This is normal after surgery.  Elevate your leg when you are not up walking on it.   Continue to use the breathing machine you got in the hospital (incentive spirometer) which will help keep your temperature down.  It is common for your temperature to cycle up and down following surgery, especially at night when you are not up moving around and exerting yourself.  The breathing machine keeps your lungs expanded and your temperature down.   DIET:  As you were doing prior to hospitalization, we recommend a well-balanced diet.  DRESSING / WOUND CARE / SHOWERING  You may shower 3 days after surgery, but keep the wounds dry during showering.  You may use an occlusive plastic wrap (Press'n Seal for example), NO SOAKING/SUBMERGING IN THE BATHTUB.  If the bandage gets wet, change with a clean dry gauze.  If the incision gets wet, pat the wound dry with a clean towel.  ACTIVITY  Increase activity slowly as tolerated, but follow the weight bearing instructions below.   No driving for 6 weeks or  until further direction given by your physician.  You cannot drive while taking narcotics.  No lifting or carrying greater than 10 lbs. until further directed by your surgeon. Avoid periods of inactivity such as sitting longer than an hour when not asleep. This helps prevent blood clots.  You may return to work once you are authorized by your doctor.     WEIGHT BEARING   Weight bearing as tolerated with assist device (walker, cane, etc) as directed, use it as long as suggested by your surgeon or therapist, typically at least 4-6 weeks.   EXERCISES  Results after joint replacement surgery are often greatly improved when you follow the exercise, range of motion and muscle strengthening exercises prescribed by your doctor. Safety measures are also important to protect the joint from further injury.  Any time any of these exercises cause you to have increased pain or swelling, decrease what you are doing until you are comfortable again and then slowly increase them. If you have problems or questions, call your caregiver or physical therapist for advice.   Rehabilitation is important following a joint replacement. After just a few days of immobilization, the muscles of the leg can become weakened and shrink (atrophy).  These exercises are designed to build up the tone and strength of the thigh and leg muscles and to improve motion. Often times heat used for twenty to thirty minutes before working out will loosen up your tissues and help with improving the range of motion but do not use heat for the first two weeks following surgery (sometimes heat can increase post-operative swelling).   These exercises can be done on a training (exercise) mat, on the floor, on a table or on a bed. Use whatever works the best and is most comfortable for you.    Use music or television while you are exercising so that the exercises are a pleasant break in your day. This will make your life better with the exercises acting as a break in your routine that you can look forward to.   Perform all exercises about fifteen times, three times per day or as directed.  You should exercise both the operative leg and the other leg as well.   Exercises include:   Quad Sets - Tighten up the muscle on the front of the thigh (Quad) and hold for 5-10 seconds.   Straight Leg Raises - With your knee straight (if you were given a brace, keep it on), lift the leg to 60 degrees, hold for 3 seconds, and slowly lower the leg.  Perform this exercise against resistance later as your leg gets stronger.  Leg Slides: Lying on your back, slowly slide your foot toward your buttocks, bending your knee up off the floor (only go as far as is comfortable). Then slowly slide your foot back down until your leg is flat on the floor again.  Angel Wings: Lying  on your back spread your legs to the side as far apart as you can without causing discomfort.  Hamstring Strength:  Lying on your back, push your heel against the floor with your leg straight by tightening up the muscles of your buttocks.  Repeat, but this time bend your knee to a comfortable angle, and push your heel against the floor.  You may put a pillow under the heel to make it more comfortable if necessary.   A rehabilitation program following joint replacement surgery can speed recovery and prevent re-injury in the future due to weakened  muscles. Contact your doctor or a physical therapist for more information on knee rehabilitation.    CONSTIPATION  Constipation is defined medically as fewer than three stools per week and severe constipation as less than one stool per week.  Even if you have a regular bowel pattern at home, your normal regimen is likely to be disrupted due to multiple reasons following surgery.  Combination of anesthesia, postoperative narcotics, change in appetite and fluid intake all can affect your bowels.   YOU MUST use at least one of the following options; they are listed in order of increasing strength to get the job done.  They are all available over the counter, and you may need to use some, POSSIBLY even all of these options:    Drink plenty of fluids (prune juice may be helpful) and high fiber foods Colace 100 mg by mouth twice a day  Senokot for constipation as directed and as needed Dulcolax (bisacodyl), take with full glass of water  Miralax (polyethylene glycol) once or twice a day as needed.  If you have tried all these things and are unable to have a bowel movement in the first 3-4 days after surgery call either your surgeon or your primary doctor.    If you experience loose stools or diarrhea, hold the medications until you stool forms back up.  If your symptoms do not get better within 1 week or if they get worse, check with your doctor.  If you  experience "the worst abdominal pain ever" or develop nausea or vomiting, please contact the office immediately for further recommendations for treatment.   ITCHING:  If you experience itching with your medications, try taking only a single pain pill, or even half a pain pill at a time.  You can also use Benadryl over the counter for itching or also to help with sleep.   TED HOSE STOCKINGS:  Use stockings on both legs until for at least 2 weeks or as directed by physician office. They may be removed at night for sleeping.  MEDICATIONS:  See your medication summary on the "After Visit Summary" that nursing will review with you.  You may have some home medications which will be placed on hold until you complete the course of blood thinner medication.  It is important for you to complete the blood thinner medication as prescribed.  PRECAUTIONS:  If you experience chest pain or shortness of breath - call 911 immediately for transfer to the hospital emergency department.   If you develop a fever greater that 101 F, purulent drainage from wound, increased redness or drainage from wound, foul odor from the wound/dressing, or calf pain - CONTACT YOUR SURGEON.                                                   FOLLOW-UP APPOINTMENTS:  If you do not already have a post-op appointment, please call the office for an appointment to be seen by your surgeon.  Guidelines for how soon to be seen are listed in your "After Visit Summary", but are typically between 1-4 weeks after surgery.  OTHER INSTRUCTIONS:   Knee Replacement:  Do not place pillow under knee, focus on keeping the knee straight while resting. CPM instructions: 0-90 degrees, 2 hours in the morning, 2 hours in the afternoon, and 2 hours in the evening.  Place foam block, curve side up under heel at all times except when in CPM or when walking.  DO NOT modify, tear, cut, or change the foam block in any way.  MAKE SURE YOU:  Understand these  instructions.  Get help right away if you are not doing well or get worse.    Thank you for letting us be a part of your medical care team.  It is a privilege we respect greatly.  We hope these instructions will help you stay on track for a fast and full recovery!     Increase activity slowly as tolerated    Complete by:  As directed      Weight bearing as tolerated    Complete by:  As directed               Signed: Velvie Thomaston SCOTT 12/26/2015, 11:51 PM

## 2015-12-26 NOTE — Progress Notes (Signed)
ANTICOAGULATION CONSULT NOTE - Follow Up Consult  Pharmacy Consult for Coumadin Indication: VTE prophylaxis  Allergies  Allergen Reactions  . Diazepam Other (See Comments)    REACTION: pt states she gets very angry and abusive verbally on this medication     Patient Measurements: Height: 5\' 8"  (172.7 cm) Weight: 208 lb (94.348 kg) IBW/kg (Calculated) : 63.9  Vital Signs: Temp: 99.2 F (37.3 C) (01/19 0514) Temp Source: Oral (01/19 0514) BP: 133/53 mmHg (01/19 1043) Pulse Rate: 89 (01/19 0514)  Labs:  Recent Labs  12/24/15 2108 12/25/15 0705 12/26/15 0624  HGB  --  10.2*  --   HCT  --  30.6*  --   PLT  --  157  --   LABPROT 14.4 15.1 24.0*  INR 1.10 1.17 2.17*  CREATININE  --  0.73  --     Estimated Creatinine Clearance: 84.2 mL/min (by C-G formula based on Cr of 0.73).   Assessment: 72 YOF on Coumadin for VTE prophylaxis s/p left TKA.  INR increased significantly to therapeutic level today after Coumadin 5mg  x 2 doses that were given close together.  No bleeding reported.   Goal of Therapy:  INR 2-3    Plan:  - Hold Coumadin tonight - Daily PT / INR    Kellee Sittner D. Mina Marble, PharmD, BCPS Pager:  [phone] 12/26/2015, 1:19 PM

## 2015-12-26 NOTE — Clinical Social Work Placement (Signed)
   CLINICAL SOCIAL WORK PLACEMENT  NOTE  Date:  12/26/2015  Patient Details  Name: Amanda [name] MRN: [id number] Date of Birth: 04-Mar-1951  Clinical Social Work is seeking post-discharge placement for this patient at the [place] level of care (*CSW will initial, date and re-position this form in  chart as items are completed):  Yes   Patient/family provided with [facility] Work Department's list of facilities offering this level of care within the geographic area requested by the patient (or if unable, by the patient's family).  Yes   Patient/family informed of their freedom to choose among providers that offer the needed level of care, that participate in Medicare, Medicaid or managed care program needed by the patient, have an available bed and are willing to accept the patient.  Yes   Patient/family informed of [facility]'s ownership interest in [facility] and [facility], as well as of the fact that they are under no obligation to receive care at these facilities.  PASRR submitted to EDS on       PASRR number received on       Existing PASRR number confirmed on       FL2 transmitted to all facilities in geographic area requested by pt/family on 12/26/15     FL2 transmitted to all facilities within larger geographic area on       Patient informed that his/her managed care company has contracts with or will negotiate with certain facilities, including the following:            Patient/family informed of bed offers received.  Patient chooses bed at       Physician recommends and patient chooses bed at      Patient to be transferred to   on  .  Patient to be transferred to facility by       Patient family notified on   of transfer.  Name of family member notified:        PHYSICIAN Please sign FL2     Additional Comment:    _______________________________________________ Leane Call, Student-SW 12/26/2015, 9:33 AM

## 2015-12-26 NOTE — Progress Notes (Signed)
Orthopedic Tech Progress Note Patient Details:  Amanda [name] 1951-04-23 [id number] On cpm at [id number] Patient ID: Amanda [name], female   DOB: August 11, 1951, 65 y.o.   MRN: [id number]   Braulio Bosch 12/26/2015, 3:14 PM

## 2015-12-26 NOTE — Progress Notes (Signed)
Physical Therapy Treatment Patient Details Name: Amanda [name] MRN: [id number] DOB: [birth year] Today's Date: 12/26/2015    History of Present Illness 65 y.o. female now s/p Lt TKA. PMH: depression, anxiety, smoker, diabetes.    PT Comments    Patient seen for PT session with focus on HEP. Patient just returning from ambulation with OT. Patient reports that she is improving with mobility. Continue to recommend SNF upon D/C. PT to follow and progress mobility as tolerated.   Follow Up Recommendations  SNF;Supervision for mobility/OOB     Equipment Recommendations  None recommended by PT    Recommendations for Other Services       Precautions / Restrictions Precautions Precautions: Knee;Fall Required Braces or Orthoses: Knee Immobilizer - Left Knee Immobilizer - Left: On when out of bed or walking Restrictions Weight Bearing Restrictions: Yes LLE Weight Bearing: Weight bearing as tolerated    Mobility  Bed Mobility             Transfers               Ambulation/Gait                 Stairs            Wheelchair Mobility    Modified Rankin (Stroke Patients Only)       Balance      Cognition Arousal/Alertness: Awake/alert Behavior During Therapy: WFL for tasks assessed/performed Overall Cognitive Status: Within Functional Limits for tasks assessed                      Exercises Total Joint Exercises Ankle Circles/Pumps: AROM;Both;10 reps Quad Sets: Strengthening;Left;10 reps Short Arc Quad: Strengthening;Left;10 reps (mod assist) Heel Slides: AAROM;Left;10 reps Hip ABduction/ADduction: Strengthening;Left;10 reps;Other (comment) (min assist) Straight Leg Raises: Strengthening;Left;10 reps;Other (comment) (mod-min assist)    General Comments        Pertinent Vitals/Pain Pain Assessment: 0-10 Pain Score: 8  Pain Location: Lt knee Pain Descriptors / Indicators: Aching Pain Intervention(s): Limited activity  within patient's tolerance;Monitored during session;Ice applied    Home Living   Prior Function          PT Goals (current goals can now be found in the care plan section) Acute Rehab PT Goals Patient Stated Goal: Get better PT Goal Formulation: With patient Time For Goal Achievement: 01/08/16 Potential to Achieve Goals: Good Progress towards PT goals: Progressing toward goals    Frequency  7X/week    PT Plan Current plan remains appropriate    Co-evaluation             End of Session   Activity Tolerance: Patient tolerated treatment well;Patient limited by pain Patient left: in bed;with call bell/phone within reach;with family/visitor present;Other (comment) (CPM on )     Time: 1340-1401 PT Time Calculation (min) (ACUTE ONLY): 21 min  Charges:  $Therapeutic Exercise: 8-22 mins                    G Codes:      Cassell Clement, PT, CSCS Pager [phone] Office [phone]  12/26/2015, 4:06 PM

## 2015-12-26 NOTE — Clinical Social Work Note (Signed)
Clinical Social Work Assessment  Patient Details  Name: Amanda [name] MRN: [id number] Date of Birth: 10/04/1951  Date of referral:  12/26/15               Reason for consult:  Discharge Planning, Facility Placement                Permission sought to share information with:   (n/a) Permission granted to share information::  Yes, Verbal Permission Granted  Name::      (n/a)  Agency::   (SNF)  Relationship::   (n/a)  Contact Information:   (n/a)  Housing/Transportation Living arrangements for the past 2 months:  Single Family Home Source of Information:  Patient Patient Interpreter Needed:  None Criminal Activity/Legal Involvement Pertinent to Current Situation/Hospitalization:  No - Comment as needed Significant Relationships:  Adult Children, Spouse Lives with:  Spouse, Adult Children Do you feel safe going back to the place where you live?  No Need for family participation in patient care:  Yes (Comment)  Care giving concerns:   Patient did not express any care giving concerns at this time.   Social Worker assessment / plan:   BSW intern has spoken with patient at bedside to discuss discharge disposition. BSW intern made patient aware of SNF recommendations given by PT. BSW intern further explained SNF process as well as insurance coverage with patient. Patient lives at home with spouse and daughter. Patient informed BSW intern that she will not have 24 hour supervision once d/c from facility due to spouse having medical instabilities as well. Also, patient's daughter works full time. Patient expressed interest in [facility] and rehab. Patient stated that the facility is close to her residence which would be easier for family to visit when necessary. BSW intern to fax patient's clinical to SNF's within both, [facility] and [facility]. BSW intern to follow up with patient in reference to bed offers extended once reviewed by facility. BSW intern remains available if  any further Social Work needs arises.   Employment status:  Retired Forensic scientist:  Medicare PT Recommendations:  [place] / Referral to community resources:  [place]  Patient/Family's Response to care:   Patient was very receptive and understanding during Airline pilot. Patient appreciated Social Work intervention given by [employer].  Patient/Family's Understanding of and Emotional Response to Diagnosis, Current Treatment, and Prognosis:   Patient understood need for further medical care and rehab at SNF.  Emotional Assessment Appearance:  Appears stated age Attitude/Demeanor/Rapport:   (pleasant) Affect (typically observed):  Accepting, Calm, Appropriate, Pleasant Orientation:  Oriented to Self, Oriented to Place, Oriented to  Time, Oriented to Situation Alcohol / Substance use:  Not Applicable Psych involvement (Current and /or in the community):  No (Comment)  Discharge Needs  Concerns to be addressed:  No discharge needs identified Readmission within the last 30 days:  No Current discharge risk:  None Barriers to Discharge:  No Barriers Identified   Leane Call, Student-SW 12/26/2015, 9:47 AM

## 2015-12-26 NOTE — NC FL2 (Signed)
[place] MEDICAID FL2 LEVEL OF CARE SCREENING TOOL     IDENTIFICATION  Patient Name: Amanda [name] Birthdate: 1951-11-17 Sex: female Admission Date (Current Location): 12/24/2015  [facility] and [place] Number:  Kathleen Argue  ([id number]) Facility and Address:  [facility] [place], [place] [address] [place], [place] [address]      Provider Number: [id number]  Attending Physician Name and Address:  [name], MD  Relative Name and Phone Number:   (n/a)    Current Level of Care: Hospital Recommended Level of Care: [place] Prior Approval Number:    Date Approved/Denied:   PASRR Number:  [id number]  Discharge Plan: SNF    Current Diagnoses: Patient Active Problem List   Diagnosis Date Noted  . Degenerative arthritis of left knee 12/24/2015  . Left facial pain 10/29/2015  . Memory loss 10/29/2015  . Palpitations 07/18/2015  . Osteoarthritis Knees, bilateral, moderate 06/16/2015  . Smoker 05/10/2015  . Solitary pulmonary nodule 03/28/2013  . Cardiac murmur 03/15/2012  . Hypertension   . UNSPECIFIED VITAMIN D DEFICIENCY 05/29/2009  . Pure hypercholesterolemia 12/07/2007  . DEPRESSIVE DISORDER, RCR, SEVERE 12/07/2007  . Diabetes mellitus type 2, controlled ([place]) 08/10/2007    Orientation RESPIRATION BLADDER Height & Weight    Self, Time, Situation, Place  Normal Continent 5\' 8"  (172.7 cm) 208 lbs.  BEHAVIORAL SYMPTOMS/MOOD NEUROLOGICAL BOWEL NUTRITION STATUS      Continent Diet (CARB MODIFIED)  AMBULATORY STATUS COMMUNICATION OF NEEDS Skin   Extensive Assist   Surgical wounds (left leg)                       Personal Care Assistance Level of Assistance              Functional Limitations Info             SPECIAL CARE FACTORS FREQUENCY  PT (By licensed PT)     PT Frequency:  (7x/week)              Contractures      Additional Factors Info  Code Status, Allergies Code Status Info:   (FULL) Allergies Info:  (diazpam)           Current Medications (12/26/2015):  This is the current hospital active medication list Current Facility-Administered Medications  Medication Dose Route Frequency Provider Last Rate Last Dose  . acetaminophen (TYLENOL) tablet 650 mg  650 mg Oral Q6H PRN [name], MD   650 mg at 12/25/15 1950   Or  . acetaminophen (TYLENOL) suppository 650 mg  650 mg Rectal Q6H PRN [name], MD      . amitriptyline (ELAVIL) tablet 75 mg  75 mg Oral QHS [name], MD   75 mg at 12/25/15 2130  . atorvastatin (LIPITOR) tablet 40 mg  40 mg Oral q1800 [name], MD   40 mg at 12/25/15 1806  . cholecalciferol (VITAMIN D) tablet 1,000 Units  1,000 Units Oral BID [name], MD   1,000 Units at 12/25/15 2131  . clonazePAM (KLONOPIN) tablet 0.5 mg  0.5 mg Oral BID PRN [name], MD      . cyclobenzaprine (FLEXERIL) tablet 5 mg  5 mg Oral TID PRN [name], MD   5 mg at 12/26/15 0659  . diphenhydrAMINE (BENADRYL) capsule 25 mg  25 mg Oral BID [name], MD   25 mg at 12/25/15 2131  . docusate sodium (COLACE)  capsule 100 mg  100 mg Oral Daily PRN [name], MD   100 mg at 12/25/15 1806  . FLUoxetine (PROZAC) capsule 40 mg  40 mg Oral Once per day on Sun Tue Thu Sat [name], MD      . FLUoxetine [facility]) capsule 80 mg  80 mg Oral Once per day on Mon Wed Fri [name], MD   80 mg at 12/25/15 0901  . HYDROmorphone (DILAUDID) injection 1 mg  1 mg Intravenous Q3H PRN [name], MD   1 mg at 12/25/15 2127  . magnesium oxide (MAG-OX) tablet 400 mg  400 mg Oral Daily [name], MD   400 mg at 12/25/15 0901  . menthol-cetylpyridinium (CEPACOL) lozenge 3 mg  1 lozenge Oral PRN [name], MD   3 mg at 12/26/15 0214   Or  . phenol (CHLORASEPTIC) mouth spray 1 spray  1 spray Mouth/Throat PRN [name], MD      . metoCLOPramide (REGLAN) tablet 5-10 mg   5-10 mg Oral Q8H PRN [name], MD       Or  . metoCLOPramide (REGLAN) injection 5-10 mg  5-10 mg Intravenous Q8H PRN [name], MD      . metoprolol tartrate (LOPRESSOR) tablet 25 mg  25 mg Oral BID [name], MD   25 mg at 12/25/15 2131  . ondansetron (ZOFRAN) tablet 4 mg  4 mg Oral Q6H PRN [name], MD       Or  . ondansetron [facility]) injection 4 mg  4 mg Intravenous Q6H PRN [name], MD      . oxyCODONE (Oxy IR/ROXICODONE) immediate release tablet 5-10 mg  5-10 mg Oral Q3H PRN [name], MD   10 mg at 12/26/15 0659  . pantoprazole (PROTONIX) EC tablet 40 mg  40 mg Oral Daily [name], MD   40 mg at 12/25/15 0901  . vitamin C (ASCORBIC ACID) tablet 1,000 mg  1,000 mg Oral Daily [name], MD   1,000 mg at 12/25/15 0901  . Warfarin - Pharmacist Dosing Inpatient   Does not apply [id number] [name], MD         Discharge Medications: Please see discharge summary for a list of discharge medications.  Relevant Imaging Results:  Relevant Lab Results:   Additional Information  (SSN: [id number])  Leane Call, Student-SW [phone]

## 2015-12-26 NOTE — Progress Notes (Signed)
Physical Therapy Treatment Patient Details Name: Amanda [name] MRN: [id number] DOB: Nov 05, 1951 Today's Date: 12/26/2015    History of Present Illness 65 y.o. female now s/p Lt TKA. PMH: depression, anxiety, smoker, diabetes.    PT Comments    Patient making gradual progress with mobility but remains limited at this time. Patient able to ambulate 45 feet with min guard assist and requiring mod assist for sit to stand transfers. Based upon the patient's current mobility, continue to recommend SNF at D/C. PT to continue to follow and progress.   Follow Up Recommendations  SNF;Supervision for mobility/OOB     Equipment Recommendations  None recommended by PT    Recommendations for Other Services       Precautions / Restrictions Precautions Precautions: Knee;Fall Required Braces or Orthoses: Knee Immobilizer - Left Knee Immobilizer - Left: On when out of bed or walking Restrictions Weight Bearing Restrictions: Yes LLE Weight Bearing: Weight bearing as tolerated    Mobility  Bed Mobility Overal bed mobility: Needs Assistance Bed Mobility: Supine to Sit     Supine to sit: Min assist     General bed mobility comments: Lt LE, no assist needed at trunk. Patient using bed rails to assist.  Transfers Overall transfer level: Needs assistance Equipment used: Rolling walker (2 wheeled) Transfers: Sit to/from Stand Sit to Stand: Mod assist         General transfer comment: reminder for hand placement prior to standing and sitting.   Ambulation/Gait Ambulation/Gait assistance: Min guard Ambulation Distance (Feet): 45 Feet Assistive device: Rolling walker (2 wheeled) Gait Pattern/deviations: Step-to pattern Gait velocity: decreased   General Gait Details: Patient unable to bear full weight through LLE due to complaints of pain.    Stairs            Wheelchair Mobility    Modified Rankin (Stroke Patients Only)       Balance Overall balance assessment:  Needs assistance Sitting-balance support: No upper extremity supported Sitting balance-Leahy Scale: Good     Standing balance support: Bilateral upper extremity supported Standing balance-Leahy Scale: Poor Standing balance comment: using rw                    Cognition Arousal/Alertness: Awake/alert Behavior During Therapy: WFL for tasks assessed/performed Overall Cognitive Status: Within Functional Limits for tasks assessed                      Exercises Total Joint Exercises Ankle Circles/Pumps: AROM;Both;10 reps Quad Sets: Strengthening;Left;10 reps Heel Slides: AAROM;Left;10 reps Goniometric ROM: 46 degrees flexion    General Comments        Pertinent Vitals/Pain Pain Assessment: 0-10 Pain Score: 8  Pain Location: Lt knee/thigh Pain Descriptors / Indicators: Aching Pain Intervention(s): Limited activity within patient's tolerance;Monitored during session;RN gave pain meds during session    Home Living                      Prior Function            PT Goals (current goals can now be found in the care plan section) Acute Rehab PT Goals Patient Stated Goal: get out of bed PT Goal Formulation: With patient Time For Goal Achievement: 01/08/16 Potential to Achieve Goals: Good Progress towards PT goals: Progressing toward goals    Frequency  7X/week    PT Plan Current plan remains appropriate    Co-evaluation  End of Session Equipment Utilized During Treatment: Gait belt;Left knee immobilizer Activity Tolerance: Patient limited by pain Patient left: in chair;with call bell/phone within reach;Other (comment) (in knee extension stretch)     Time: [id number] PT Time Calculation (min) (ACUTE ONLY): 25 min  Charges:  $Gait Training: 8-22 mins $Therapeutic Exercise: 8-22 mins                    G Codes:      Cassell Clement, PT, CSCS Pager [phone] Office [phone]  12/26/2015, 10:58 AM

## 2015-12-27 DIAGNOSIS — R2681 Unsteadiness on feet: Secondary | ICD-10-CM | POA: Diagnosis not present

## 2015-12-27 DIAGNOSIS — E7800 Pure hypercholesterolemia, unspecified: Secondary | ICD-10-CM | POA: Diagnosis not present

## 2015-12-27 DIAGNOSIS — R278 Other lack of coordination: Secondary | ICD-10-CM | POA: Diagnosis not present

## 2015-12-27 DIAGNOSIS — Z471 Aftercare following joint replacement surgery: Secondary | ICD-10-CM | POA: Diagnosis not present

## 2015-12-27 DIAGNOSIS — M1712 Unilateral primary osteoarthritis, left knee: Secondary | ICD-10-CM | POA: Diagnosis not present

## 2015-12-27 DIAGNOSIS — R262 Difficulty in walking, not elsewhere classified: Secondary | ICD-10-CM | POA: Diagnosis not present

## 2015-12-27 DIAGNOSIS — F329 Major depressive disorder, single episode, unspecified: Secondary | ICD-10-CM | POA: Diagnosis not present

## 2015-12-27 DIAGNOSIS — K219 Gastro-esophageal reflux disease without esophagitis: Secondary | ICD-10-CM | POA: Diagnosis not present

## 2015-12-27 DIAGNOSIS — M6281 Muscle weakness (generalized): Secondary | ICD-10-CM | POA: Diagnosis not present

## 2015-12-27 DIAGNOSIS — I10 Essential (primary) hypertension: Secondary | ICD-10-CM | POA: Diagnosis not present

## 2015-12-27 DIAGNOSIS — F17200 Nicotine dependence, unspecified, uncomplicated: Secondary | ICD-10-CM | POA: Diagnosis not present

## 2015-12-27 DIAGNOSIS — Z96652 Presence of left artificial knee joint: Secondary | ICD-10-CM | POA: Diagnosis not present

## 2015-12-27 DIAGNOSIS — D62 Acute posthemorrhagic anemia: Secondary | ICD-10-CM | POA: Diagnosis not present

## 2015-12-27 DIAGNOSIS — K5900 Constipation, unspecified: Secondary | ICD-10-CM | POA: Diagnosis not present

## 2015-12-27 LAB — PROTIME-INR
INR: 2.2 — ABNORMAL HIGH (ref 0.00–1.49)
Prothrombin Time: 24.3 seconds — ABNORMAL HIGH (ref 11.6–15.2)

## 2015-12-27 LAB — GLUCOSE, CAPILLARY
Glucose-Capillary: 115 mg/dL — ABNORMAL HIGH (ref 65–99)
Glucose-Capillary: 67 mg/dL (ref 65–99)

## 2015-12-27 MED ORDER — WARFARIN SODIUM 5 MG PO TABS: 2.5000 mg | ORAL_TABLET | Freq: Every day | ORAL | Status: DC

## 2015-12-27 MED ORDER — WARFARIN SODIUM 5 MG PO TABS: 5.0000 mg | ORAL_TABLET | Freq: Every day | ORAL | Status: DC

## 2015-12-27 NOTE — Progress Notes (Signed)
Subjective: Pt stable - moving better   Objective: Vital signs in last 24 hours: Temp:  [99.1 F (37.3 C)-100.1 F (37.8 C)] 100.1 F (37.8 C) (01/20 0612) Pulse Rate:  [89-116] 89 (01/20 0612) Resp:  [18] 18 (01/20 0612) BP: (116-133)/(45-94) 131/45 mmHg (01/20 0612) SpO2:  [95 %-98 %] 98 % (01/20 0612)  Intake/Output from previous day: 01/19 0701 - 01/20 0700 In: 240 [P.O.:240] Out: 400 [Urine:400] Intake/Output this shift: Total I/O In: 240 [P.O.:240] Out: -   Exam:  Compartment soft  Labs:  Recent Labs  12/25/15 0705  HGB 10.2*    Recent Labs  12/25/15 0705  WBC 10.4  RBC 2.98*  HCT 30.6*  PLT 157    Recent Labs  12/25/15 0705  NA 136  K 4.1  CL 106  CO2 26  BUN 8  CREATININE 0.73  GLUCOSE 119*  CALCIUM 8.6*    Recent Labs  12/26/15 1150 12/27/15 0443  INR 2.58* 2.20*    Assessment/Plan: Plan dc to snf today - summary done - rx on chart   Shironda Kain SCOTT 12/27/2015, 9:19 AM

## 2015-12-27 NOTE — Clinical Social Work Note (Addendum)
Patient to be d/c'ed today to [facility].  Patient and family agreeable to plans will transport via daughter's car RN to call report to [phone].  Evette Cristal, MSW, [place]

## 2015-12-27 NOTE — Care Management Important Message (Signed)
Important Message  Patient Details  Name: Amanda [name] MRN: [id number] Date of Birth: Jan 19, 1951   Medicare Important Message Given:  Yes    Naiara Lombardozzi P Jaryd Drew 12/27/2015, 11:50 AM

## 2015-12-27 NOTE — Progress Notes (Signed)
Physical Therapy Treatment Patient Details Name: Amanda [name] MRN: [id number] DOB: 12/19/1950 Today's Date: 12/27/2015    History of Present Illness 65 y.o. female now s/p Lt TKA. PMH: depression, anxiety, smoker, diabetes.    PT Comments    Patient making gradual progress with PT regarding strength and mobility. Patient still needing assistance but steadily requiring less assist. Based upon the patient's current mobility level, recommending SNF at D/C, patient in agreement. PT to follow as appropriate.   Follow Up Recommendations  SNF;Supervision for mobility/OOB     Equipment Recommendations  None recommended by PT    Recommendations for Other Services       Precautions / Restrictions Precautions Precautions: Knee;Fall Required Braces or Orthoses: Knee Immobilizer - Left Knee Immobilizer - Left: On when out of bed or walking Restrictions Weight Bearing Restrictions: Yes LLE Weight Bearing: Weight bearing as tolerated    Mobility  Bed Mobility Overal bed mobility: Needs Assistance Bed Mobility: Supine to Sit     Supine to sit: Min assist     General bed mobility comments: assist with LLE  Transfers Overall transfer level: Needs assistance Equipment used: Rolling walker (2 wheeled) Transfers: Sit to/from Stand Sit to Stand: Min guard         General transfer comment: cues for hand position  Ambulation/Gait Ambulation/Gait assistance: Min guard Ambulation Distance (Feet): 55 Feet Assistive device: Rolling walker (2 wheeled) Gait Pattern/deviations: Step-to pattern;Decreased step length - right;Decreased step length - left;Decreased weight shift to left Gait velocity: decreased   General Gait Details: Patient reports having less pain with ambulation today   Stairs            Wheelchair Mobility    Modified Rankin (Stroke Patients Only)       Balance Overall balance assessment: Needs assistance Sitting-balance support: No upper extremity  supported Sitting balance-Leahy Scale: Good     Standing balance support: Bilateral upper extremity supported Standing balance-Leahy Scale: Poor Standing balance comment: using rw                    Cognition Arousal/Alertness: Awake/alert Behavior During Therapy: WFL for tasks assessed/performed Overall Cognitive Status: Within Functional Limits for tasks assessed                      Exercises Total Joint Exercises Ankle Circles/Pumps: AROM;Both;10 reps Quad Sets: Strengthening;Left;10 reps Heel Slides: AAROM;Left;10 reps Hip ABduction/ADduction: Strengthening;Left;10 reps;Other (comment) Straight Leg Raises: Strengthening;Left;10 reps Goniometric ROM: 55 degrees flexion    General Comments        Pertinent Vitals/Pain Pain Assessment: 0-10 Pain Score: 4  Pain Location: Lt knee Pain Descriptors / Indicators: Aching;Sore Pain Intervention(s): Limited activity within patient's tolerance;Monitored during session    Home Living                      Prior Function            PT Goals (current goals can now be found in the care plan section) Acute Rehab PT Goals Patient Stated Goal: have less pain PT Goal Formulation: With patient Time For Goal Achievement: 01/08/16 Potential to Achieve Goals: Good Progress towards PT goals: Progressing toward goals    Frequency  7X/week    PT Plan Current plan remains appropriate    Co-evaluation             End of Session Equipment Utilized During Treatment: Gait belt;Left knee immobilizer Activity Tolerance: Patient tolerated  treatment well Patient left: in chair;with call bell/phone within reach;Other (comment) (in knee extension stretch)     Time: [id number] PT Time Calculation (min) (ACUTE ONLY): 27 min  Charges:  $Gait Training: 8-22 mins $Therapeutic Exercise: 8-22 mins                    G Codes:      Cassell Clement, PT, CSCS Pager [phone] Office [phone]  12/27/2015, 9:07 AM

## 2015-12-27 NOTE — Progress Notes (Addendum)
ANTICOAGULATION CONSULT NOTE - Follow Up Consult  Pharmacy Consult for Coumadin Indication: VTE prophylaxis  Allergies  Allergen Reactions  . Diazepam Other (See Comments)    REACTION: pt states she gets very angry and abusive verbally on this medication     Patient Measurements: Height: 5\' 8"  (172.7 cm) Weight: 208 lb (94.348 kg) IBW/kg (Calculated) : 63.9  Vital Signs: Temp: 100.1 F (37.8 C) (01/20 0612) Temp Source: Oral (01/20 0612) BP: 131/45 mmHg (01/20 0612) Pulse Rate: 89 (01/20 0612)  Labs:  Recent Labs  12/25/15 0705 12/26/15 0624 12/26/15 1150 12/27/15 0443  HGB 10.2*  --   --   --   HCT 30.6*  --   --   --   PLT 157  --   --   --   LABPROT 15.1 24.0* 27.3* 24.3*  INR 1.17 2.17* 2.58* 2.20*  CREATININE 0.73  --   --   --     Estimated Creatinine Clearance: 84.2 mL/min (by C-G formula based on Cr of 0.73).   Assessment: 29 YOF on Coumadin for VTE prophylaxis s/p left TKA.  INR remains therapeutic after Coumadin was held yesterday; no bleeding reported.  Noted plan to discharge to SNF today.   Goal of Therapy:  INR 2-3    Plan:  - Coumadin 2.5mg  PO today if not discharged - Daily PT / INR - I've adjusted patient's Coumadin discharge order to 2.5mg  PO daily at 1800 instead of 5mg  PO daily.  I've also spoken to the representative from [place] regarding this change.    Marchella Hibbard D. Mina Marble, PharmD, BCPS Pager:  [phone] 12/27/2015, 10:44 AM

## 2015-12-31 ENCOUNTER — Non-Acute Institutional Stay (SKILLED_NURSING_FACILITY): Payer: Medicare Other | Admitting: Internal Medicine

## 2015-12-31 DIAGNOSIS — F329 Major depressive disorder, single episode, unspecified: Secondary | ICD-10-CM

## 2015-12-31 DIAGNOSIS — R2681 Unsteadiness on feet: Secondary | ICD-10-CM

## 2015-12-31 DIAGNOSIS — F17200 Nicotine dependence, unspecified, uncomplicated: Secondary | ICD-10-CM

## 2015-12-31 DIAGNOSIS — I10 Essential (primary) hypertension: Secondary | ICD-10-CM

## 2015-12-31 DIAGNOSIS — K5900 Constipation, unspecified: Secondary | ICD-10-CM | POA: Diagnosis not present

## 2015-12-31 DIAGNOSIS — K219 Gastro-esophageal reflux disease without esophagitis: Secondary | ICD-10-CM

## 2015-12-31 DIAGNOSIS — D62 Acute posthemorrhagic anemia: Secondary | ICD-10-CM

## 2015-12-31 DIAGNOSIS — M1712 Unilateral primary osteoarthritis, left knee: Secondary | ICD-10-CM

## 2015-12-31 NOTE — Progress Notes (Signed)
Patient ID: Amanda [name], female   DOB: September 28, 1951, 65 y.o.   MRN: [id number]     Facility: [facility]    PCP: Owens Loffler, MD  Code Status: full code  Allergies  Allergen Reactions  . Diazepam Other (See Comments)    REACTION: pt states she gets very angry and abusive verbally on this medication     Chief Complaint  Patient presents with  . New Admit To SNF     HPI:  65 y.o. patient is here for short term rehabilitation post hospital admission from 12/24/15-12/27/15 with left knee OA. She underwent left total knee arthroplasty. She has PMH of HTN, DM 2, memory loss among others. She is seen in her room today. Her pain is under control. She has been constipated. She has been working with therapy team. She was smoking prior to hospital admission and feels jittery and restless not being able to smoke, she was smoking a pack a day  Review of Systems:  Constitutional: Negative for fever, chills, diaphoresis.  HENT: Negative for headache, congestion, difficulty swallowing.   Eyes: Negative for blurred vision, double vision and discharge.  Respiratory: Negative for cough, shortness of breath and wheezing.   Cardiovascular: Negative for chest pain, palpitations, leg swelling.  Gastrointestinal: Negative for heartburn, nausea, vomiting, abdominal pain. Has been constipated and was taking stool softner and laxative at home Genitourinary: Negative for dysuria, flank pain.  Musculoskeletal: Negative for back pain, falls Skin: Negative for itching, rash.  Neurological: Negative for dizziness Psychiatric/Behavioral: Negative for depression   Past Medical History  Diagnosis Date  . Depression   . Anxiety   . Hyperlipidemia   . GERD (gastroesophageal reflux disease)   . Tobacco abuse   . Smoker 05/10/2015  . Degenerative arthritis of knee, bilateral 06/16/2015  . Pneumonia     hx of this several times  . Heart murmur     slight per pt- benign   .  Hypertension     hx of tachycardia   . History of tachycardia   . Thyroid disease     hyperthyroidism as a teenager - received some injections age 65 that corrected this   . Constipation     uses docusate and fiber  . Diabetes ([place])     diet managed-type 11  . Tachycardia   . Shortness of breath dyspnea     with exertion  . Headache   . 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 Tonsillectomy      4 yoa  . Ovarian cyst removal  1972  . Abdominal hysterectomy  1993  . Wrist fracture surgery  11/2008  . Esophageal dilation      x 3  . Colonoscopy      30 years ago was normal per pt.   . Fractured leg Right   . Salivary gland surgery    . Cholecystectomy    . Back surgery    . Total knee arthroplasty Left 12/24/2015    Procedure: LEFT TOTAL KNEE ARTHROPLASTY;  Surgeon: [name], MD;  Location: [facility];  Service: Orthopedics;  Laterality: Left;   Social History:   reports that she has been smoking Cigarettes.  She has a 45 pack-year smoking history. She has never used smokeless tobacco. She reports that she drinks alcohol. She reports that she does not use illicit drugs.  Family History  Problem Relation Age of Onset  . Hyperlipidemia Mother   . Arthritis Mother   . Diabetes Mother   . Heart disease Mother   . Colon polyps Mother   . Cancer Paternal Grandmother     breast  . Hypercholesterolemia Sister   . Colon polyps Sister   . Colon cancer Neg Hx   . Esophageal cancer Neg Hx   . Rectal cancer Neg Hx   . Stomach cancer Neg Hx     Medications:   Medication List       This list is accurate as of: 12/31/15 12:59 PM.  Always use your most recent med list.               amitriptyline 50 MG tablet  Commonly known as:  ELAVIL  Take 1 & 1/2 tablets at night     atorvastatin 40 MG tablet  Commonly known as:  LIPITOR  TAKE ONE TABLET BY MOUTH ONCE DAILY     BENADRYL 25 mg  capsule  Generic drug:  diphenhydrAMINE  Take 25 mg by mouth 2 (two) times daily.     clonazePAM 0.5 MG tablet  Commonly known as:  KLONOPIN  TAKE ONE TABLET BY MOUTH TWICE DAILY AS NEEDED     cyclobenzaprine 10 MG tablet  Commonly known as:  FLEXERIL  TAKE ONE TABLET BY MKOUTH THREE TIMES A DAY AS NEEDED FOR MUSCLE SPASMS     esomeprazole 40 MG capsule  Commonly known as:  NEXIUM  Take 1 capsule (40 mg total) by mouth daily before breakfast.     FIBER THERAPY PO  Take 625 mg by mouth as needed.     FLUoxetine 40 MG capsule  Commonly known as:  PROZAC  Take 40-80 mg by mouth as directed. Take 2 capsules on Monday,Wednesday, & Friday Take 1 capsule all other days     magnesium oxide 400 MG tablet  Commonly known as:  MAG-OX  Take 400 mg by mouth daily.     metoprolol tartrate 25 MG tablet  Commonly known as:  LOPRESSOR  TAKE ONE TABLET BY MOUTH TWICE DAILY     multivitamin tablet  Take 1 tablet by mouth daily.     oxyCODONE 5 MG immediate release tablet  Commonly known as:  Oxy IR/ROXICODONE  Take 1-2 tablets (5-10 mg total) by mouth every 3 (three) hours as needed for breakthrough pain.     rivaroxaban 10 MG Tabs tablet  Commonly known as:  XARELTO  Take 10 mg by mouth daily.     STOOL SOFTENER PO  Take 100 mg by mouth daily as needed. For stool softner     vitamin C 1000 MG tablet  Take 1,000 mg by mouth daily.     Vitamin D 1000 units capsule  Take 1,000 Units by mouth 2 (two) times daily.         Physical Exam: Filed Vitals:   12/31/15 1251  BP: 146/76  Pulse: 95  Temp: 98.7 F (37.1 C)  Resp: 19  SpO2: 95%    General- elderly female, obese, in no acute distress Head- normocephalic, atraumatic Nose- no maxillary or frontal sinus tenderness, no nasal discharge Throat- moist mucus membrane, has upper and lower dentures Eyes- PERRLA, EOMI, no pallor, no icterus, no discharge, normal conjunctiva, normal sclera Neck- no cervical  lymphadenopathy Cardiovascular- normal s1,s2, no murmurs, palpable dorsalis pedis and radial pulses, trace left leg edema Respiratory- bilateral clear to auscultation, no wheeze, no rhonchi, no crackles, no  use of accessory muscles Abdomen- bowel sounds present, soft, non tender Musculoskeletal- able to move all 4 extremities, limited left knee ROM  Neurological- no focal deficit, alert and oriented to person, place and time Skin- warm and dry, left knee surgical incision with clean and dry dressing Psychiatry- normal mood and affect    Labs reviewed: Basic Metabolic Panel:  Recent Labs  09/18/15 1318 12/16/15 1408 12/25/15 0705  NA 140 139 136  K 4.2 4.3 4.1  CL 105 103 106  CO2 29 27 26   GLUCOSE 95 104* 119*  BUN 11 8 8   CREATININE 0.71 0.74 0.73  CALCIUM 9.5 9.7 8.6*   Liver Function Tests:  Recent Labs  09/18/15 1318  AST 18  ALT 15  ALKPHOS 41  BILITOT 0.4  PROT 6.8  ALBUMIN 4.1   No results for input(s): LIPASE, AMYLASE in the last 8760 hours. No results for input(s): AMMONIA in the last 8760 hours. CBC:  Recent Labs  09/18/15 1318 12/16/15 1408 12/25/15 0705  WBC 7.5 7.3 10.4  NEUTROABS 4.8  --   --   HGB 12.8 13.6 10.2*  HCT 38.5 41.5 30.6*  MCV 102.9* 103.5* 102.7*  PLT 222.0 212 157   Cardiac Enzymes: No results for input(s): CKTOTAL, CKMB, CKMBINDEX, TROPONINI in the last 8760 hours. BNP: Invalid input(s): POCBNP CBG:  Recent Labs  12/26/15 2243 12/27/15 0709 12/27/15 1146  GLUCAP 107* 115* 46    Radiological Exams: Dg Chest 2 View  12/16/2015  CLINICAL DATA:  Preop exam. Scheduled for knee surgery. History of hypertension. EXAM: CHEST  2 VIEW COMPARISON:  03/15/2013. FINDINGS: Cardiac silhouette normal in size and configuration. No mediastinal or hilar masses or evidence of adenopathy. Clear lungs. Stable Bochdalek's hernia at the posterior left lung base. No pleural effusion or pneumothorax. Bony thorax is demineralized but intact.  IMPRESSION: No active cardiopulmonary disease. Electronically Signed   By: Lajean Manes M.D.   On: 12/16/2015 14:11    Assessment/Plan  Unsteady gait S/p left TKA. Will have patient work with PT/OT as tolerated to regain strength and restore function.  Fall precautions are in place.  Left knee OA S/p left total knee arthroplasty. Has f/u with orthopedics. Continue vitamin d supplement. Currently on oxyIR 5 mg 1-2 tab 3 prn pain. Continue flexeril 10 mg tid prn muscle spasm. WBAT. To use CPM. Continue xarelto for dvt prophylaxis  Blood loss anemia Post op, check cbc  Tobacco use Start nicotine patch 21 mcg/ daily and monitor. counselled on smoking cessation  HTN Elevated SBP. Continue lopressor 25 mg bid and check bp daily for now, check bmp  Constipation Discontinue docusate and start senna s 2 tab qhs with miralax daily and maintain hydration. Continue metamucil but change to daily.  Chronic depression Continue prozac, elavil and prn klonopin, no changes made, mood stable  gerd Stable on current regimen of nexium, continue this and monitor   Goals of care: short term rehabilitation   Labs/tests ordered: cbc, cmp 01/01/16  Family/ staff Communication: reviewed care plan with patient and nursing supervisor    Blanchie Serve, MD  [place] [phone] (Monday-Friday 8 am - 5 pm) [phone] (afterhours)

## 2016-01-03 ENCOUNTER — Other Ambulatory Visit: Payer: Self-pay | Admitting: *Deleted

## 2016-01-03 MED ORDER — OXYCODONE HCL 5 MG PO TABS: 5.0000 mg | ORAL_TABLET | ORAL | Status: DC | PRN

## 2016-01-03 NOTE — Telephone Encounter (Signed)
[facility] 

## 2016-01-06 ENCOUNTER — Encounter: Payer: Self-pay | Admitting: Nurse Practitioner

## 2016-01-06 ENCOUNTER — Non-Acute Institutional Stay (SKILLED_NURSING_FACILITY): Payer: Medicare Other | Admitting: Nurse Practitioner

## 2016-01-06 DIAGNOSIS — F329 Major depressive disorder, single episode, unspecified: Secondary | ICD-10-CM

## 2016-01-06 DIAGNOSIS — K5900 Constipation, unspecified: Secondary | ICD-10-CM

## 2016-01-06 DIAGNOSIS — E7800 Pure hypercholesterolemia, unspecified: Secondary | ICD-10-CM | POA: Diagnosis not present

## 2016-01-06 DIAGNOSIS — I10 Essential (primary) hypertension: Secondary | ICD-10-CM

## 2016-01-06 DIAGNOSIS — M1712 Unilateral primary osteoarthritis, left knee: Secondary | ICD-10-CM

## 2016-01-06 DIAGNOSIS — K219 Gastro-esophageal reflux disease without esophagitis: Secondary | ICD-10-CM | POA: Diagnosis not present

## 2016-01-06 DIAGNOSIS — D62 Acute posthemorrhagic anemia: Secondary | ICD-10-CM

## 2016-01-06 NOTE — Progress Notes (Signed)
Patient ID: Amanda [name], female   DOB: May 10, 1951, 65 y.o.   MRN: [id number]    Nursing Home Location:  [place] of Service: SNF (31)  PCP: Owens Loffler, MD  Allergies  Allergen Reactions  . Diazepam Other (See Comments)    REACTION: pt states she gets very angry and abusive verbally on this medication     Chief Complaint  Patient presents with  . Discharge Note    Discharge from facility    HPI:  Patient is a 65 y.o. female seen today at [facility] for discharge home. She has PMH of HTN, DM 2, memory loss. Patient is here for short term rehabilitation after hospitalization from 12/24/15-12/27/15 with left knee OA. She underwent left total knee arthroplasty. Pain is currently controlled on current regimen. Patient currently doing well with therapy, now stable to discharge home with home health.   Review of Systems:  Review of Systems  Constitutional: Negative for activity change, appetite change, fatigue and unexpected weight change.  HENT: Negative for congestion and hearing loss.   Eyes: Negative.   Respiratory: Negative for cough and shortness of breath.   Cardiovascular: Negative for chest pain, palpitations and leg swelling.  Gastrointestinal: Negative for abdominal pain, diarrhea and constipation.  Genitourinary: Negative for dysuria and difficulty urinating.  Musculoskeletal: Negative for myalgias and arthralgias.  Skin: Negative for color change and wound.  Neurological: Negative for dizziness and weakness.  Psychiatric/Behavioral: Negative for behavioral problems, confusion and agitation.    Past Medical History  Diagnosis Date  . Depression   . Anxiety   . Hyperlipidemia   . GERD (gastroesophageal reflux disease)   . Tobacco abuse   . Smoker 05/10/2015  . Degenerative arthritis of knee, bilateral 06/16/2015  . Pneumonia     hx of this several times  . Heart murmur     slight per pt- benign   .  Hypertension     hx of tachycardia   . History of tachycardia   . Thyroid disease     hyperthyroidism as a teenager - received some injections age 62 that corrected this   . Constipation     uses docusate and fiber  . Diabetes ([place])     diet managed-type 11  . Tachycardia   . Shortness of breath dyspnea     with exertion  . Headache   . 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 Tonsillectomy      4 yoa  . Ovarian cyst removal  1972  . Abdominal hysterectomy  1993  . Wrist fracture surgery  11/2008  . Esophageal dilation      x 3  . Colonoscopy      30 years ago was normal per pt.   . Fractured leg Right   . Salivary gland surgery    . Cholecystectomy    . Back surgery    . Total knee arthroplasty Left 12/24/2015    Procedure: LEFT TOTAL KNEE ARTHROPLASTY;  Surgeon: [name], MD;  Location: [facility];  Service: Orthopedics;  Laterality: Left;   Social History:   reports that she has been smoking Cigarettes.  She has a 45 pack-year smoking history. She has never used smokeless tobacco. She reports that she drinks alcohol. She reports that she does not use illicit drugs.  Family History  Problem Relation Age  of Onset  . Hyperlipidemia Mother   . Arthritis Mother   . Diabetes Mother   . Heart disease Mother   . Colon polyps Mother   . Cancer Paternal Grandmother     breast  . Hypercholesterolemia Sister   . Colon polyps Sister   . Colon cancer Neg Hx   . Esophageal cancer Neg Hx   . Rectal cancer Neg Hx   . Stomach cancer Neg Hx     Medications: Patient's Medications  New Prescriptions   No medications on file  Previous Medications   AMITRIPTYLINE (ELAVIL) 50 MG TABLET    Take 1 & 1/2 tablets at night   ASCORBIC ACID (VITAMIN C) 1000 MG TABLET    Take 1,000 mg by mouth daily.   ATORVASTATIN (LIPITOR) 40 MG TABLET    TAKE ONE TABLET BY MOUTH ONCE DAILY   CHOLECALCIFEROL  (VITAMIN D) 1000 UNITS CAPSULE    Take 1,000 Units by mouth 2 (two) times daily.    CLONAZEPAM (KLONOPIN) 0.5 MG TABLET    TAKE ONE TABLET BY MOUTH TWICE DAILY AS NEEDED   CYCLOBENZAPRINE (FLEXERIL) 10 MG TABLET    TAKE ONE TABLET BY MKOUTH THREE TIMES A DAY AS NEEDED FOR MUSCLE SPASMS    DIPHENHYDRAMINE (BENADRYL) 25 MG CAPSULE    Take 25 mg by mouth 2 (two) times daily.   ESOMEPRAZOLE (NEXIUM) 40 MG CAPSULE    Take 1 capsule (40 mg total) by mouth daily before breakfast.   FLUOXETINE (PROZAC) 40 MG CAPSULE    Take 40-80 mg by mouth as directed. Take 2 capsules on Monday,Wednesday, & Friday Take 1 capsule all other days   MAGNESIUM OXIDE (MAG-OX) 400 MG TABLET    Take 400 mg by mouth daily.   METOPROLOL TARTRATE (LOPRESSOR) 25 MG TABLET    TAKE ONE TABLET BY MOUTH TWICE DAILY   MULTIPLE VITAMIN (MULTIVITAMIN) TABLET    Take 1 tablet by mouth daily.   OXYCODONE (OXY IR/ROXICODONE) 5 MG IMMEDIATE RELEASE TABLET    Take 1-2 tablets (5-10 mg total) by mouth every 3 (three) hours as needed for breakthrough pain.   PSYLLIUM (METAMUCIL PO)    Take 1 capsule by mouth daily.   RIVAROXABAN (XARELTO) 10 MG TABS TABLET    Take 10 mg by mouth daily.  Modified Medications   No medications on file  Discontinued Medications   DOCUSATE CALCIUM (STOOL SOFTENER PO)    Take 100 mg by mouth daily as needed. For stool softner   METHYLCELLULOSE, LAXATIVE, (FIBER THERAPY PO)    Take 625 mg by mouth as needed.      Physical Exam: Filed Vitals:   01/06/16 1115  BP: 131/66  Pulse: 70  Temp: 97.4 F (36.3 C)  Resp: 18  Height: 5\' 8"  (1.727 m)  Weight: 208 lb (94.348 kg)  SpO2: 96%    Physical Exam  Constitutional: She is oriented to person, place, and time. She appears well-developed and well-nourished. No distress.  HENT:  Head: Normocephalic and atraumatic.  Mouth/Throat: Oropharynx is clear and moist. No oropharyngeal exudate.  Eyes: Conjunctivae are normal. Pupils are equal, round, and reactive to  light.  Neck: Normal range of motion. Neck supple.  Cardiovascular: Normal rate, regular rhythm and normal heart sounds.   Pulmonary/Chest: Effort normal and breath sounds normal.  Abdominal: Soft. Bowel sounds are normal.  Musculoskeletal: She exhibits tenderness. She exhibits no edema.  Left knee with limited ROM  Neurological: She is alert and oriented to person, place,  and time.  Skin: Skin is warm and dry. She is not diaphoretic.  left knee surgical incision healing well  Psychiatric: She has a normal mood and affect.    Labs reviewed: Basic Metabolic Panel:  Recent Labs  09/18/15 1318 12/16/15 1408 12/25/15 0705  NA 140 139 136  K 4.2 4.3 4.1  CL 105 103 106  CO2 29 27 26   GLUCOSE 95 104* 119*  BUN 11 8 8   CREATININE 0.71 0.74 0.73  CALCIUM 9.5 9.7 8.6*   Liver Function Tests:  Recent Labs  09/18/15 1318  AST 18  ALT 15  ALKPHOS 41  BILITOT 0.4  PROT 6.8  ALBUMIN 4.1   No results for input(s): LIPASE, AMYLASE in the last 8760 hours. No results for input(s): AMMONIA in the last 8760 hours. CBC:  Recent Labs  09/18/15 1318 12/16/15 1408 12/25/15 0705  WBC 7.5 7.3 10.4  NEUTROABS 4.8  --   --   HGB 12.8 13.6 10.2*  HCT 38.5 41.5 30.6*  MCV 102.9* 103.5* 102.7*  PLT 222.0 212 157   TSH:  Recent Labs  09/18/15 1318  TSH 0.62   A1C: Lab Results  Component Value Date   HGBA1C 5.8* 12/16/2015   Lipid Panel: No results for input(s): CHOL, HDL, LDLCALC, TRIG, CHOLHDL, LDLDIRECT in the last 8760 hours.  Radiological Exams: Dg Chest 2 View  12/16/2015  CLINICAL DATA:  Preop exam. Scheduled for knee surgery. History of hypertension. EXAM: CHEST  2 VIEW COMPARISON:  03/15/2013. FINDINGS: Cardiac silhouette normal in size and configuration. No mediastinal or hilar masses or evidence of adenopathy. Clear lungs. Stable Bochdalek's hernia at the posterior left lung base. No pleural effusion or pneumothorax. Bony thorax is demineralized but intact.  IMPRESSION: No active cardiopulmonary disease. Electronically Signed   By: Lajean Manes M.D.   On: 12/16/2015 14:11    Assessment/Plan 1. Primary osteoarthritis of left knee S/p left total knee arthroplasty. Continue vitamin d supplement. Stable on oxyIR 5 mg 1-2 tab q 3 prn pain. Also on flexeril 10 mg tid prn muscle spasm. Continue xarelto for dvt prophylaxis Has ongoing follow up with orthopedics.   2. Pure hypercholesterolemia conts on lipitor   3. Constipation, unspecified constipation type -chronic problem, currently on colace twice daily, miralax and fiber. Pt to increase activity, water intake and will increase miralax to twice daily   4. Major depression, chronic (H[facility]) Stable, to cont fluoxetine and elavil   5. Gastroesophageal reflux disease, esophagitis presence not specified Stable on nexium  6. Essential hypertension, benign Blood pressure stable on metoprolol   7. Acute blood loss anemia -post-op, will need PCP to follow up CBC   pt is stable for discharge-will need PT/OT per home health. No DME needed. Rx written.  will need to follow up with PCP within 2 weeks.   Carlos American. Harle Battiest  [facility] [phone] 8 am - 5 pm) [phone] (after hours)

## 2016-01-08 DIAGNOSIS — M1712 Unilateral primary osteoarthritis, left knee: Secondary | ICD-10-CM | POA: Diagnosis not present

## 2016-01-08 DIAGNOSIS — Z96652 Presence of left artificial knee joint: Secondary | ICD-10-CM | POA: Diagnosis not present

## 2016-01-17 DIAGNOSIS — Z87891 Personal history of nicotine dependence: Secondary | ICD-10-CM | POA: Diagnosis not present

## 2016-01-17 DIAGNOSIS — Z96652 Presence of left artificial knee joint: Secondary | ICD-10-CM | POA: Diagnosis not present

## 2016-01-17 DIAGNOSIS — Z7982 Long term (current) use of aspirin: Secondary | ICD-10-CM | POA: Diagnosis not present

## 2016-01-17 DIAGNOSIS — Z471 Aftercare following joint replacement surgery: Secondary | ICD-10-CM | POA: Diagnosis not present

## 2016-01-17 DIAGNOSIS — M6281 Muscle weakness (generalized): Secondary | ICD-10-CM | POA: Diagnosis not present

## 2016-01-21 DIAGNOSIS — Z471 Aftercare following joint replacement surgery: Secondary | ICD-10-CM | POA: Diagnosis not present

## 2016-01-21 DIAGNOSIS — Z96652 Presence of left artificial knee joint: Secondary | ICD-10-CM | POA: Diagnosis not present

## 2016-01-21 DIAGNOSIS — Z87891 Personal history of nicotine dependence: Secondary | ICD-10-CM | POA: Diagnosis not present

## 2016-01-21 DIAGNOSIS — M6281 Muscle weakness (generalized): Secondary | ICD-10-CM | POA: Diagnosis not present

## 2016-01-21 DIAGNOSIS — Z7982 Long term (current) use of aspirin: Secondary | ICD-10-CM | POA: Diagnosis not present

## 2016-01-22 ENCOUNTER — Ambulatory Visit: Payer: Medicare Other | Admitting: Family Medicine

## 2016-01-22 DIAGNOSIS — Z7982 Long term (current) use of aspirin: Secondary | ICD-10-CM | POA: Diagnosis not present

## 2016-01-22 DIAGNOSIS — Z87891 Personal history of nicotine dependence: Secondary | ICD-10-CM | POA: Diagnosis not present

## 2016-01-22 DIAGNOSIS — M6281 Muscle weakness (generalized): Secondary | ICD-10-CM | POA: Diagnosis not present

## 2016-01-22 DIAGNOSIS — Z471 Aftercare following joint replacement surgery: Secondary | ICD-10-CM | POA: Diagnosis not present

## 2016-01-22 DIAGNOSIS — Z96652 Presence of left artificial knee joint: Secondary | ICD-10-CM | POA: Diagnosis not present

## 2016-01-23 ENCOUNTER — Ambulatory Visit (INDEPENDENT_AMBULATORY_CARE_PROVIDER_SITE_OTHER): Payer: Medicare Other | Admitting: Family Medicine

## 2016-01-23 ENCOUNTER — Encounter: Payer: Self-pay | Admitting: Family Medicine

## 2016-01-23 VITALS — BP 90/60 | HR 81 | Temp 97.8°F | Ht 68.0 in | Wt 206.5 lb

## 2016-01-23 DIAGNOSIS — R413 Other amnesia: Secondary | ICD-10-CM | POA: Diagnosis not present

## 2016-01-23 DIAGNOSIS — M1712 Unilateral primary osteoarthritis, left knee: Secondary | ICD-10-CM

## 2016-01-23 DIAGNOSIS — I10 Essential (primary) hypertension: Secondary | ICD-10-CM

## 2016-01-23 DIAGNOSIS — R51 Headache: Secondary | ICD-10-CM

## 2016-01-23 DIAGNOSIS — R519 Headache, unspecified: Secondary | ICD-10-CM

## 2016-01-23 DIAGNOSIS — F3341 Major depressive disorder, recurrent, in partial remission: Secondary | ICD-10-CM

## 2016-01-23 MED ORDER — METOPROLOL TARTRATE 25 MG PO TABS: 12.5000 mg | ORAL_TABLET | Freq: Two times a day (BID) | ORAL | Status: DC

## 2016-01-23 MED ORDER — AMITRIPTYLINE HCL 75 MG PO TABS
75.0000 mg | ORAL_TABLET | Freq: Every day | ORAL | Status: DC
Start: 2016-01-23 — End: 2016-02-18

## 2016-01-23 NOTE — Progress Notes (Signed)
Pre visit review using our clinic review tool, if applicable. No additional management support is needed unless otherwise documented below in the visit note. 

## 2016-01-23 NOTE — Progress Notes (Signed)
Dr. Frederico Hamman T. Pete Schnitzer, MD, [facility] [place], [address] Phone: [phone] Fax: [phone]  01/23/2016  Patient: Amanda [name], MRN: [id number], DOB: 1951/08/11, 65 y.o.  Primary Physician:  Owens Loffler, MD   Chief Complaint  Patient presents with  . Follow-up    [facility] & Knee Replacement   Subjective:   Amanda SETARO is a 65 y.o. very pleasant female patient who presents with the following:  F/u [facility]:  Immunization History  Administered Date(s) Administered  . Influenza Split 09/09/2011, 10/10/2012  . Influenza Whole 09/20/2006  . Influenza,inj,Quad PF,36+ Mos 09/18/2015  . PPD Test 12/27/2015  . Pneumococcal Conjugate-13 09/18/2015  . Td 12/26/1998    BP low:  BP Readings from Last 3 Encounters:  01/23/16 90/60  01/06/16 131/66  12/31/15 146/76     Follow-up knee replacement, she is doing reasonably well, but she has had some difficulties with regaining her range of motion, she has some pain and is using a walker right now. Her friends and family are driving her around and helping take care of her husband currently.   reviewed the neurology notes with the patient as well as her MRI.   Facial pain is gone at this point. Tolerating the increased dose of Elavil well.  Past Medical History, Surgical History, Social History, Family History, Problem List, Medications, and Allergies have been reviewed and updated if relevant.  Patient Active Problem List   Diagnosis Date Noted  . Degenerative arthritis of left knee 12/24/2015  . Left facial pain 10/29/2015  . Memory loss 10/29/2015  . Palpitations 07/18/2015  . Osteoarthritis Knees, bilateral, moderate 06/16/2015  . Smoker 05/10/2015  . Solitary pulmonary nodule 03/28/2013  . Cardiac murmur 03/15/2012  . Hypertension   . UNSPECIFIED VITAMIN D DEFICIENCY 05/29/2009  . Pure hypercholesterolemia 12/07/2007  . DEPRESSIVE  DISORDER, RCR, SEVERE 12/07/2007  . Diabetes mellitus type 2, controlled ([place]) 08/10/2007    Past Medical History  Diagnosis Date  . Depression   . Anxiety   . Hyperlipidemia   . GERD (gastroesophageal reflux disease)   . Tobacco abuse   . Smoker 05/10/2015  . Degenerative arthritis of knee, bilateral 06/16/2015  . Pneumonia     hx of this several times  . Heart murmur     slight per pt- benign   . Hypertension     hx of tachycardia   . History of tachycardia   . Thyroid disease     hyperthyroidism as a teenager - received some injections age 61 that corrected this   . Constipation     uses docusate and fiber  . Diabetes ([place])     diet managed-type 11  . Tachycardia   . Shortness of breath dyspnea     with exertion  . Headache   . 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 Tonsillectomy      4 yoa  . Ovarian cyst removal  1972  . Abdominal hysterectomy  1993  . Wrist fracture surgery  11/2008  . Esophageal dilation      x 3  . Colonoscopy      30 years ago was normal per pt.   . Fractured leg Right   . Salivary gland surgery    . Cholecystectomy    . Back surgery    . Total  knee arthroplasty Left 12/24/2015    Procedure: LEFT TOTAL KNEE ARTHROPLASTY;  Surgeon: [name], MD;  Location: [facility];  Service: Orthopedics;  Laterality: Left;    Social History   Social History  . Marital Status: Married    Spouse Name: N/A  . Number of Children: 2  . Years of Education: N/A   Occupational History  . [place] History Main Topics  . Smoking status: Current Every Day Smoker -- 1.00 packs/day for 45 years    Types: Cigarettes  . Smokeless tobacco: Never Used  . Alcohol Use: 0.0 oz/week    0 Standard drinks or equivalent per week     Comment: glass of wine twice a month   . Drug Use: No  . Sexual Activity: Not on file   Other Topics Concern   . Not on file   Social History Narrative    Family History  Problem Relation Age of Onset  . Hyperlipidemia Mother   . Arthritis Mother   . Diabetes Mother   . Heart disease Mother   . Colon polyps Mother   . Cancer Paternal Grandmother     breast  . Hypercholesterolemia Sister   . Colon polyps Sister   . Colon cancer Neg Hx   . Esophageal cancer Neg Hx   . Rectal cancer Neg Hx   . Stomach cancer Neg Hx     Allergies  Allergen Reactions  . Diazepam Other (See Comments)    REACTION: pt states she gets very angry and abusive verbally on this medication     Medication list reviewed and updated in full in [place].  GEN: No fevers, chills. Nontoxic. Primarily MSK c/o today. MSK: Detailed in the HPI GI: tolerating PO intake without difficulty Neuro: No numbness, parasthesias, or tingling associated. Otherwise the pertinent positives of the ROS are noted above.   Objective:   BP 90/60 mmHg  Pulse 81  Temp(Src) 97.8 F (36.6 C) (Oral)  Ht 5\' 8"  (1.727 m)  Wt 206 lb 8 oz (93.668 kg)  BMI 31.41 kg/m2   GEN: WDWN, NAD, Non-toxic, A & O x 3 HEENT: Atraumatic, Normocephalic. Neck supple. No masses, No LAD. Ears and Nose: No external deformity. CV: RRR, No M/G/R. No JVD. No thrill. No extra heart sounds. PULM: CTA B, no wheezes, crackles, rhonchi. No retractions. No resp. distress. No accessory muscle use. EXTR: No c/c/e NEURO Normal gait.  PSYCH: Normally interactive. Conversant. Not depressed or anxious appearing.  Calm demeanor.    L knee incision c/d/i Active rom to 95 deg flexion  Radiology: Dg Chest 2 View  12/16/2015  CLINICAL DATA:  Preop exam. Scheduled for knee surgery. History of hypertension. EXAM: CHEST  2 VIEW COMPARISON:  03/15/2013. FINDINGS: Cardiac silhouette normal in size and configuration. No mediastinal or hilar masses or evidence of adenopathy. Clear lungs. Stable Bochdalek's hernia at the posterior left lung base. No pleural effusion  or pneumothorax. Bony thorax is demineralized but intact. IMPRESSION: No active cardiopulmonary disease. Electronically Signed   By: Lajean Manes M.D.   On: 12/16/2015 14:11   Assessment and Plan:   Primary osteoarthritis of left knee  Left facial pain - Plan: amitriptyline (ELAVIL) 75 MG tablet  Memory loss  Essential hypertension  Major depressive disorder, recurrent episode, in partial remission (HCC)   decrease Lopressor dose by half.   reassured her regarding the facial pain that has resolved as well as the memory loss and  reviewed neurology's thoughts and recommendations.  Depression is stable, reassured her that Prozac should in no way cause any form of memory loss.   encouraged her to continue with physical therapy and work on her range of motion and therapy exercises daily status post total knee replacement, one month out.  Follow-up: as needed and for medicare wellness  Modified Medications   Modified Medication Previous Medication   AMITRIPTYLINE (ELAVIL) 75 MG TABLET amitriptyline (ELAVIL) 50 MG tablet      Take 1 tablet (75 mg total) by mouth at bedtime.    Take 1 & 1/2 tablets at night   METOPROLOL TARTRATE (LOPRESSOR) 25 MG TABLET metoprolol tartrate (LOPRESSOR) 25 MG tablet      Take 0.5 tablets (12.5 mg total) by mouth 2 (two) times daily.    TAKE ONE TABLET BY MOUTH TWICE DAILY   No orders of the defined types were placed in this encounter.    Signed,  Maud Deed. Carey Lafon, MD   Patient's Medications  New Prescriptions   No medications on file  Previous Medications   ASCORBIC ACID (VITAMIN C) 1000 MG TABLET    Take 1,000 mg by mouth daily.   ATORVASTATIN (LIPITOR) 40 MG TABLET    TAKE ONE TABLET BY MOUTH ONCE DAILY   CHOLECALCIFEROL (VITAMIN D) 1000 UNITS CAPSULE    Take 1,000 Units by mouth 2 (two) times daily.    CLONAZEPAM (KLONOPIN) 0.5 MG TABLET    TAKE ONE TABLET BY MOUTH TWICE DAILY AS NEEDED   CYCLOBENZAPRINE (FLEXERIL) 10 MG TABLET    TAKE ONE  TABLET BY MKOUTH THREE TIMES A DAY AS NEEDED FOR MUSCLE SPASMS    DIPHENHYDRAMINE (BENADRYL) 25 MG CAPSULE    Take 25 mg by mouth 2 (two) times daily.   ESOMEPRAZOLE (NEXIUM) 40 MG CAPSULE    Take 1 capsule (40 mg total) by mouth daily before breakfast.   FLUOXETINE (PROZAC) 40 MG CAPSULE    Take 40-80 mg by mouth as directed. Take 2 capsules on Monday,Wednesday, & Friday Take 1 capsule all other days   MAGNESIUM OXIDE (MAG-OX) 400 MG TABLET    Take 400 mg by mouth daily.   MULTIPLE VITAMIN (MULTIVITAMIN) TABLET    Take 1 tablet by mouth daily.   OXYCODONE (OXY IR/ROXICODONE) 5 MG IMMEDIATE RELEASE TABLET    Take 1-2 tablets (5-10 mg total) by mouth every 3 (three) hours as needed for breakthrough pain.   PSYLLIUM (METAMUCIL PO)    Take 1 capsule by mouth daily.   TRAMADOL (ULTRAM) 50 MG TABLET    Take 50 mg by mouth every 6 (six) hours as needed. Reported on 01/23/2016  Modified Medications   Modified Medication Previous Medication   AMITRIPTYLINE (ELAVIL) 75 MG TABLET amitriptyline (ELAVIL) 50 MG tablet      Take 1 tablet (75 mg total) by mouth at bedtime.    Take 1 & 1/2 tablets at night   METOPROLOL TARTRATE (LOPRESSOR) 25 MG TABLET metoprolol tartrate (LOPRESSOR) 25 MG tablet      Take 0.5 tablets (12.5 mg total) by mouth 2 (two) times daily.    TAKE ONE TABLET BY MOUTH TWICE DAILY  Discontinued Medications   RIVAROXABAN (XARELTO) 10 MG TABS TABLET    Take 10 mg by mouth daily.

## 2016-01-24 DIAGNOSIS — M6281 Muscle weakness (generalized): Secondary | ICD-10-CM | POA: Diagnosis not present

## 2016-01-24 DIAGNOSIS — Z96652 Presence of left artificial knee joint: Secondary | ICD-10-CM | POA: Diagnosis not present

## 2016-01-24 DIAGNOSIS — Z471 Aftercare following joint replacement surgery: Secondary | ICD-10-CM | POA: Diagnosis not present

## 2016-01-24 DIAGNOSIS — Z87891 Personal history of nicotine dependence: Secondary | ICD-10-CM | POA: Diagnosis not present

## 2016-01-24 DIAGNOSIS — Z7982 Long term (current) use of aspirin: Secondary | ICD-10-CM | POA: Diagnosis not present

## 2016-01-27 DIAGNOSIS — M6281 Muscle weakness (generalized): Secondary | ICD-10-CM | POA: Diagnosis not present

## 2016-01-27 DIAGNOSIS — Z471 Aftercare following joint replacement surgery: Secondary | ICD-10-CM | POA: Diagnosis not present

## 2016-01-27 DIAGNOSIS — Z96652 Presence of left artificial knee joint: Secondary | ICD-10-CM | POA: Diagnosis not present

## 2016-01-27 DIAGNOSIS — Z87891 Personal history of nicotine dependence: Secondary | ICD-10-CM | POA: Diagnosis not present

## 2016-01-27 DIAGNOSIS — Z7982 Long term (current) use of aspirin: Secondary | ICD-10-CM | POA: Diagnosis not present

## 2016-01-28 DIAGNOSIS — Z471 Aftercare following joint replacement surgery: Secondary | ICD-10-CM | POA: Diagnosis not present

## 2016-01-28 DIAGNOSIS — Z96652 Presence of left artificial knee joint: Secondary | ICD-10-CM | POA: Diagnosis not present

## 2016-01-28 DIAGNOSIS — M6281 Muscle weakness (generalized): Secondary | ICD-10-CM | POA: Diagnosis not present

## 2016-01-28 DIAGNOSIS — Z87891 Personal history of nicotine dependence: Secondary | ICD-10-CM | POA: Diagnosis not present

## 2016-01-28 DIAGNOSIS — Z7982 Long term (current) use of aspirin: Secondary | ICD-10-CM | POA: Diagnosis not present

## 2016-01-29 DIAGNOSIS — Z87891 Personal history of nicotine dependence: Secondary | ICD-10-CM | POA: Diagnosis not present

## 2016-01-29 DIAGNOSIS — Z96652 Presence of left artificial knee joint: Secondary | ICD-10-CM | POA: Diagnosis not present

## 2016-01-29 DIAGNOSIS — Z7982 Long term (current) use of aspirin: Secondary | ICD-10-CM | POA: Diagnosis not present

## 2016-01-29 DIAGNOSIS — M6281 Muscle weakness (generalized): Secondary | ICD-10-CM | POA: Diagnosis not present

## 2016-01-29 DIAGNOSIS — Z471 Aftercare following joint replacement surgery: Secondary | ICD-10-CM | POA: Diagnosis not present

## 2016-01-31 DIAGNOSIS — Z471 Aftercare following joint replacement surgery: Secondary | ICD-10-CM | POA: Diagnosis not present

## 2016-01-31 DIAGNOSIS — Z7982 Long term (current) use of aspirin: Secondary | ICD-10-CM | POA: Diagnosis not present

## 2016-01-31 DIAGNOSIS — Z96652 Presence of left artificial knee joint: Secondary | ICD-10-CM | POA: Diagnosis not present

## 2016-01-31 DIAGNOSIS — M6281 Muscle weakness (generalized): Secondary | ICD-10-CM | POA: Diagnosis not present

## 2016-01-31 DIAGNOSIS — Z87891 Personal history of nicotine dependence: Secondary | ICD-10-CM | POA: Diagnosis not present

## 2016-02-03 DIAGNOSIS — Z96652 Presence of left artificial knee joint: Secondary | ICD-10-CM | POA: Diagnosis not present

## 2016-02-03 DIAGNOSIS — Z7982 Long term (current) use of aspirin: Secondary | ICD-10-CM | POA: Diagnosis not present

## 2016-02-03 DIAGNOSIS — Z471 Aftercare following joint replacement surgery: Secondary | ICD-10-CM | POA: Diagnosis not present

## 2016-02-03 DIAGNOSIS — M6281 Muscle weakness (generalized): Secondary | ICD-10-CM | POA: Diagnosis not present

## 2016-02-03 DIAGNOSIS — Z87891 Personal history of nicotine dependence: Secondary | ICD-10-CM | POA: Diagnosis not present

## 2016-02-05 DIAGNOSIS — Z7982 Long term (current) use of aspirin: Secondary | ICD-10-CM | POA: Diagnosis not present

## 2016-02-05 DIAGNOSIS — M6281 Muscle weakness (generalized): Secondary | ICD-10-CM | POA: Diagnosis not present

## 2016-02-05 DIAGNOSIS — Z471 Aftercare following joint replacement surgery: Secondary | ICD-10-CM | POA: Diagnosis not present

## 2016-02-05 DIAGNOSIS — Z87891 Personal history of nicotine dependence: Secondary | ICD-10-CM | POA: Diagnosis not present

## 2016-02-05 DIAGNOSIS — Z96652 Presence of left artificial knee joint: Secondary | ICD-10-CM | POA: Diagnosis not present

## 2016-02-07 DIAGNOSIS — M6281 Muscle weakness (generalized): Secondary | ICD-10-CM | POA: Diagnosis not present

## 2016-02-07 DIAGNOSIS — Z87891 Personal history of nicotine dependence: Secondary | ICD-10-CM | POA: Diagnosis not present

## 2016-02-07 DIAGNOSIS — Z7982 Long term (current) use of aspirin: Secondary | ICD-10-CM | POA: Diagnosis not present

## 2016-02-07 DIAGNOSIS — Z96652 Presence of left artificial knee joint: Secondary | ICD-10-CM | POA: Diagnosis not present

## 2016-02-07 DIAGNOSIS — Z471 Aftercare following joint replacement surgery: Secondary | ICD-10-CM | POA: Diagnosis not present

## 2016-02-12 ENCOUNTER — Ambulatory Visit: Payer: Medicare Other | Admitting: Physical Therapy

## 2016-02-18 ENCOUNTER — Encounter: Payer: Self-pay | Admitting: Neurology

## 2016-02-18 ENCOUNTER — Ambulatory Visit: Payer: Medicare Other | Attending: Orthopedic Surgery

## 2016-02-18 ENCOUNTER — Ambulatory Visit (INDEPENDENT_AMBULATORY_CARE_PROVIDER_SITE_OTHER): Payer: Medicare Other | Admitting: Neurology

## 2016-02-18 VITALS — BP 130/76 | HR 96 | Ht 68.0 in | Wt 206.0 lb

## 2016-02-18 DIAGNOSIS — R413 Other amnesia: Secondary | ICD-10-CM

## 2016-02-18 DIAGNOSIS — R269 Unspecified abnormalities of gait and mobility: Secondary | ICD-10-CM | POA: Diagnosis not present

## 2016-02-18 DIAGNOSIS — M25562 Pain in left knee: Secondary | ICD-10-CM | POA: Insufficient documentation

## 2016-02-18 DIAGNOSIS — R51 Headache: Secondary | ICD-10-CM

## 2016-02-18 DIAGNOSIS — R519 Headache, unspecified: Secondary | ICD-10-CM

## 2016-02-18 DIAGNOSIS — G44209 Tension-type headache, unspecified, not intractable: Secondary | ICD-10-CM

## 2016-02-18 MED ORDER — AMITRIPTYLINE HCL 50 MG PO TABS: ORAL_TABLET | ORAL | Status: DC

## 2016-02-18 NOTE — Therapy (Signed)
[place] MAIN [facility] SERVICES [address] [place], [place], [address] Phone: [phone]   Fax:  [phone]  Physical Therapy Evaluation  Patient Details  Name: Amanda [name] MRN: [id number] Date of Birth: [birth year] Referring Provider: Dr. Marlou Sa  Encounter Date: 02/18/2016      PT End of Session - 02/18/16 0850    Visit Number 1   Number of Visits 13   Date for PT Re-Evaluation 03/17/16   Authorization Type 1/10 g codes   PT Start Time 0807   PT Stop Time 0845   PT Time Calculation (min) 38 min   Activity Tolerance Patient tolerated treatment well   Behavior During Therapy [facility] for tasks assessed/performed      Past Medical History  Diagnosis Date  . Depression   . Anxiety   . Hyperlipidemia   . GERD (gastroesophageal reflux disease)   . Tobacco abuse   . Smoker 05/10/2015  . Degenerative arthritis of knee, bilateral 06/16/2015  . Pneumonia     hx of this several times  . Heart murmur     slight per pt- benign   . Hypertension     hx of tachycardia   . History of tachycardia   . Thyroid disease     hyperthyroidism as a teenager - received some injections age 50 that corrected this   . Constipation     uses docusate and fiber  . Diabetes ([place])     diet managed-type 11  . Tachycardia   . Shortness of breath dyspnea     with exertion  . Headache   . 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 Tonsillectomy      4 yoa  . Ovarian cyst removal  23-Mar-1971  . Abdominal hysterectomy  1993  . Wrist fracture surgery  11/2008  . Esophageal dilation      x 3  . Colonoscopy      30 years ago was normal per pt.   . Fractured leg Right   . Salivary gland surgery    . Cholecystectomy    . Back surgery    . Total knee arthroplasty Left 12/24/2015    Procedure: LEFT TOTAL KNEE ARTHROPLASTY;  Surgeon: [name], MD;  Location: [facility];  Service:  Orthopedics;  Laterality: Left;    There were no vitals filed for this visit.  Visit Diagnosis:  Left knee pain - Plan: PT plan of care cert/re-cert  Abnormality of gait - Plan: PT plan of care cert/re-cert      Subjective Assessment - 02/18/16 0818    Subjective pt reports long history of L knee pain and OA. Pt underwent L TKA 12/24/15. pt was hospitalized for 3 days then was at a SNF until 01/06/16. pt then has had HHPT until 1 week ago. pt now presents to outpatient PT with overall improving status.    Pertinent History hx of R knee OA. hx of depression    Diagnostic tests Xray   Patient Stated Goals pt would like to improve balance/mobility and stairs.    Currently in Pain? Yes   Pain Score 5    Pain Location Knee   Pain Orientation Left   Pain Descriptors / Indicators Aching   Pain Relieving Factors ice, elevation             [facility] PT Assessment - 02/18/16 [id number]  Assessment   Medical Diagnosis L  TKA   Referring Provider Dr. Marlou Sa   Onset Date/Surgical Date 12/24/15   Prior Therapy PT   Precautions   Precautions None   Restrictions   Weight Bearing Restrictions No   Balance Screen   Has the patient fallen in the past 6 months No   Has the patient had a decrease in activity level because of a fear of falling?  Yes   Is the patient reluctant to leave their home because of a fear of falling?  Yes   [place] residence   Living Arrangements Spouse/significant other   Available Help at Discharge Family   Type of [place] to enter   Entrance Stairs-Number of Steps [place] One level   [place] - 2 wheels;Cane - single point;Tub bench;Wheelchair - manual   Prior Function   Level of Independence Independent;Independent with basic ADLs;Independent with household mobility without device;Independent with community mobility without device   Vocation Part time  employment  sub teatcher        POSTURE/OBSERVATION: Pt in no acute distress Incision well healing  PROM/AROM: L knee AROM: 0-108deg  STRENGTH:  Graded on a 0-5 scale Muscle Group Left Right                          Hip Flex 4 4-  Hip Abd 3+ 3+  Hip Add 3+ 3+  Hip Ext    Hip IR/ER    Knee Flex 4 4+  Knee Ext 4 4  Ankle DF    Ankle PF     SENSATION: Reduced L lateral knee   Flexibilty: Reduced hamstring and IT band length on the L  FUNCTIONAL MOBILITY: Pt is independent with bed mobilty and transfers    Palpation: Pt is tender over L IT band. Good patella mobility. Good scar mobility   GAIT: Pt ambulates with SPC, reduced L knee flexion during swing. L antalgia  OUTCOME MEASURES: TEST Outcome Interpretation  LEFS 32/80 40% disability                    Therex: Bridges: x 10 SLR with cues for quad set: x 10 PT recommends using rolling pin for self STM of ITband  Pt requires min verbal and tactile cues for proper exercise performance                        PT Education - 02/18/16 0849    Education provided Yes   Education Details plan of care, HEP progression/review   Person(s) Educated Patient   Methods Explanation   Comprehension Verbalized understanding             PT Long Term Goals - 02/18/16 0854    PT LONG TERM GOAL #1   Title pt will improve L knee ROM to at least 0-125 deg to be able to descend 3 stairs in step over step pattern using unilateral hand rail    Baseline 0-108deg   Time 6   Period Weeks   Status New   PT LONG TERM GOAL #2   Title pt will improve gait speed to at least 0.39m/s for normalized home mobility using LRAD   Time 6   Period Weeks   Status New   PT LONG TERM GOAL #3  Title pt will be independent and compliant to advanced HEP for conintued home strengthening    Time 6   Period Weeks   Status New   PT LONG TERM GOAL #4   Title pt will decerase LEFS to less than 25% disability to  maximize function                Plan - 03-08-16 0851    Clinical Impression Statement pt presents as 64 y/o F with his of L TKA 12/24/15. pt was DC from [facility] PT 1 week ago. pt is doing well, she has full L knee extension and L knee flexion to 108 deg. she reports in home health she had 125 deg flexion, but has not stretched as much this past week. pt demonstrates pain, bilateral hip, glut, quad and hamstring weakness and impaired balance and gait. pt would benefit from continued skilled PT services to address these deficits to return pt to PLOF. pt does have history of R knee pain and imbalance as well.    Pt will benefit from skilled therapeutic intervention in order to improve on the following deficits Abnormal gait;Decreased activity tolerance;Decreased balance;Decreased strength;Difficulty walking;Hypomobility;Pain;Impaired flexibility;Increased fascial restricitons   Rehab Potential Good   Clinical Impairments Affecting Rehab Potential hx of depression, hx of R knee OA, HTN, DM   PT Frequency 2x / week   PT Duration 6 weeks   PT Treatment/Interventions Aquatic Therapy;Cryotherapy;Electrical Stimulation;Moist Heat;Ultrasound;Neuromuscular re-education;Balance training;Therapeutic exercise;Therapeutic activities;Functional mobility training;Stair training;Gait training;Dry needling;Taping   PT Next Visit Plan progress stregnth as tolerated    Consulted and Agree with Plan of Care Patient          G-Codes - March 08, 2016 0857    Functional Assessment Tool Used LEFS/ROM   Functional Limitation Mobility: Walking and moving around   Mobility: Walking and Moving Around Current Status [phone]) At least 20 percent but less than 40 percent impaired, limited or restricted   Mobility: Walking and Moving Around Goal Status [phone]) At least 1 percent but less than 20 percent impaired, limited or restricted       Problem List Patient Active Problem List   Diagnosis Date Noted  . Degenerative  arthritis of left knee 12/24/2015  . Left facial pain 10/29/2015  . Memory loss 10/29/2015  . Osteoarthritis Knees, bilateral, moderate 06/16/2015  . Smoker 05/10/2015  . Solitary pulmonary nodule 03/28/2013  . Cardiac murmur 03/15/2012  . Hypertension   . UNSPECIFIED VITAMIN D DEFICIENCY 05/29/2009  . Pure hypercholesterolemia 12/07/2007  . Major depressive disorder, recurrent episode, in partial remission ([place]) 12/07/2007  . Diabetes mellitus type 2, controlled ([place]) 08/10/2007   Amanda [name], PT, DPT [phone]  Amanda [name] 2016-03-08, 9:00 AM  [place] MAIN [facility] SERVICES [address] [place], [place], [address] Phone: [phone]   Fax:  [phone]  Name: Amanda [name] MRN: [id number] Date of Birth: 06-19-1951

## 2016-02-18 NOTE — Patient Instructions (Addendum)
1. Increase amitriptyline to 100mg  daily. Your new prescription will be for amitriptyline 50mg : take 2 tablets at night. Monitor for drowsiness 2. Call our office in 1 month to update on how you are doing. Follow-up in 6 months 3. There is evidence the Mediterranean diet has been helpful for memory. Continue physical exercise and brain stimulation exercises for brain health.

## 2016-02-18 NOTE — Progress Notes (Signed)
NEUROLOGY FOLLOW UP OFFICE NOTE  Amanda [name] [id number]  HISTORY OF PRESENT ILLNESS: I had the pleasure of seeing Amanda [name] in follow-up in the neurology clinic on 02/18/2016.  The patient was last seen 4 months ago for worsening memory and headaches. Exam had shown sensory changes over the left side of her face. MRI brain unremarkable. ESR and CRP normal. She was advised to increase amitriptyline to 48m qhs. She was given a week course of Prednisone, which she reports helped, her head started clearing up, but 2 weeks after finishing prednisone course, pain was back where it was. She has the left temporal pain around 3 times a week, she had throbbing last night, none this morning. She did not notice any difference with the headaches on higher dose amitriptyline, but it did help with her back pain. She reports eye changes that started at the end of February, she thought there was a gnat in front of her, and found that it was a black spot on her left eye. On 02/06/16 the black spot moved to the left periphery, she sees "a spider web" on her left peripheral vision on the left eye. No eye pain. She reports memory is unchanged, it is still not like it used to be. She had left knee replacement 8 weeks ago and did notice some fogginess after the surgery, better with less pain medications. She starts PT tomorrow.   HPI 10/25/2015: This is a pleasant 65yo RH woman with a history of hypertension, hyperlipidemia, diet-controlled diabetes, neuropathy, anxiety, who presented for evaluation of worsening memory and headaches. She started noticing worsening memory changes since last summer. She feels foggy a lot of times and feels her memory is "off the charts." She cannot recall conversations from 15 minutes prior. She has had trouble with long-term memory as well, and her family is concerned this is due to taking Prozac for many years. She feels her depression is under control as long as she takes medication.  She lives with her husband and daughter, who have noticed memory changes as well. Her daughter took over bill payments 1-1/2 years ago. She reports overall compliance with medications. She denies getting lost driving. She has occasional word-finding difficulties. There is no family history of dementia. She has had several falls and head injuries over the years. She denies any significant alcohol intake.   She reports headaches started around 2 years ago after she had a bad fall in school and fell on her knees with her head hitting the door. She has pain mostly over the left side of her head, with some tingling over the left temporal region. She feels she has to rub it to feel better. She reports the left side of her head "just feels strange," tight, as if something is under her skin making it feel tight and tender. She also reports a sore spot over the vertex. Some days her headaches are like a toothache, she feels they are like a muscle ache and rubbing it helps but sometimes tender to touch. Pain occurs around 3 times a week, lasting 2-3 days. She feels there has been deformity changes over her forehead, early this year it looked like there was a big egg on her forehead, and she now feels a ridge over her forehead. She denies any diplopia, dysarthria, dysphagia, but has occasional pain with chewing over the left jaw and back of her ear. She has neck and back pain, sometimes her whole left side hurts. She  has had chronic spinal nerve damage since her back surgery, with stinging/burning pain that amitriptyline helps with. She has occasional numbness in both feet and bilateral leg pain, as if her "bones are bruised." Taking magnesium has helped some with this. Her mother and sister have migraines.   I personally reviewed MRI brain with and without contrast done 09/20/15 which did not show any acute changes, there was mild chronic microvascular disease.  PAST MEDICAL HISTORY: Past Medical History  Diagnosis  Date  . Depression   . Anxiety   . Hyperlipidemia   . GERD (gastroesophageal reflux disease)   . Tobacco abuse   . Smoker 05/10/2015  . Degenerative arthritis of knee, bilateral 06/16/2015  . Pneumonia     hx of this several times  . Heart murmur     slight per pt- benign   . Hypertension     hx of tachycardia   . History of tachycardia   . Thyroid disease     hyperthyroidism as a teenager - received some injections age 65 that corrected this   . Constipation     uses docusate and fiber  . Diabetes ([place])     diet managed-type 11  . Tachycardia   . Shortness of breath dyspnea     with exertion  . Headache   . Neuromuscular disorder ([place])     nerve damage in back and shoulder    MEDICATIONS: Current Outpatient Prescriptions on File Prior to Visit  Medication Sig Dispense Refill  . amitriptyline (ELAVIL) 50 MG tablet Take 1.5 tablet (75 mg total) by mouth at bedtime. 1 tablet 0  . Ascorbic Acid (VITAMIN C) 1000 MG tablet Take 1,000 mg by mouth daily.    Marland Kitchen atorvastatin (LIPITOR) 40 MG tablet TAKE ONE TABLET BY MOUTH ONCE DAILY 30 tablet 5  . Cholecalciferol (VITAMIN D) 1000 UNITS capsule Take 1,000 Units by mouth 2 (two) times daily.     . clonazePAM (KLONOPIN) 0.5 MG tablet TAKE ONE TABLET BY MOUTH TWICE DAILY AS NEEDED (Patient taking differently: take 0.5-1 tablet three times a day as needed for anxiety) 30 tablet 3  . cyclobenzaprine (FLEXERIL) 10 MG tablet TAKE ONE TABLET BY MKOUTH THREE TIMES A DAY AS NEEDED FOR MUSCLE SPASMS  50 tablet 5  . diphenhydrAMINE (BENADRYL) 25 mg capsule Take 25 mg by mouth 2 (two) times daily.    Marland Kitchen esomeprazole (NEXIUM) 40 MG capsule Take 1 capsule (40 mg total) by mouth daily before breakfast. 90 capsule 3  . FLUoxetine (PROZAC) 40 MG capsule Take 40-80 mg by mouth as directed. Take 2 capsules on Monday,Wednesday, & Friday Take 1 capsule all other days    . magnesium oxide (MAG-OX) 400 MG tablet Take 400 mg by mouth daily.    . metoprolol  tartrate (LOPRESSOR) 25 MG tablet Take 0.5 tablets (12.5 mg total) by mouth 2 (two) times daily. 1 tablet 0  . Multiple Vitamin (MULTIVITAMIN) tablet Take 1 tablet by mouth daily.    . Psyllium (METAMUCIL PO) Take 1 capsule by mouth daily.     No current facility-administered medications on file prior to visit.    ALLERGIES: Allergies  Allergen Reactions  . Diazepam Other (See Comments)    REACTION: pt states she gets very angry and abusive verbally on this medication     FAMILY HISTORY: Family History  Problem Relation Age of Onset  . Hyperlipidemia Mother   . Arthritis Mother   . Diabetes Mother   . Heart disease Mother   .  Colon polyps Mother   . Cancer Paternal Grandmother     breast  . Hypercholesterolemia Sister   . Colon polyps Sister   . Colon cancer Neg Hx   . Esophageal cancer Neg Hx   . Rectal cancer Neg Hx   . Stomach cancer Neg Hx     SOCIAL HISTORY: Social History   Social History  . Marital Status: Married    Spouse Name: N/A  . Number of Children: 2  . Years of Education: N/A   Occupational History  . Rector History Main Topics  . Smoking status: Current Every Day Smoker -- 1.00 packs/day for 45 years    Types: Cigarettes  . Smokeless tobacco: Never Used  . Alcohol Use: 0.0 oz/week    0 Standard drinks or equivalent per week     Comment: glass of wine twice a month   . Drug Use: No  . Sexual Activity: Not on file   Other Topics Concern  . Not on file   Social History Narrative    REVIEW OF SYSTEMS: Constitutional: No fevers, chills, or sweats, no generalized fatigue, change in appetite Eyes: No visual changes, double vision, eye pain Ear, nose and throat: No hearing loss, ear pain, nasal congestion, sore throat Cardiovascular: No chest pain, palpitations Respiratory:  No shortness of breath at rest or with exertion, wheezes GastrointestinaI: No nausea, vomiting, diarrhea, abdominal pain, fecal  incontinence Genitourinary:  No dysuria, urinary retention or frequency Musculoskeletal:  No neck pain, back pain Integumentary: No rash, pruritus, skin lesions Neurological: as above Psychiatric: No depression, insomnia, anxiety Endocrine: No palpitations, fatigue, diaphoresis, mood swings, change in appetite, change in weight, increased thirst Hematologic/Lymphatic:  No anemia, purpura, petechiae. Allergic/Immunologic: no itchy/runny eyes, nasal congestion, recent allergic reactions, rashes  PHYSICAL EXAM: Filed Vitals:   02/18/16 1527  BP: 130/76  Pulse: 96   General: No acute distress Head:  Normocephalic/atraumatic Neck: supple, no paraspinal tenderness, full range of motion Heart:  Regular rate and rhythm Lungs:  Clear to auscultation bilaterally Back: No paraspinal tenderness Skin/Extremities: No rash, no edema Neurological Exam: alert and oriented to person, place, and time. No aphasia or dysarthria. Fund of knowledge is appropriate.  Recent and remote memory are intact.  Attention and concentration are normal.    Able to name objects and repeat phrases. Cranial nerves: Pupils equal, round, reactive to light. Extraocular movements intact with no nystagmus. Visual fields full. Decreased light touch on left V1-3. No facial asymmetry. Tongue, uvula, palate midline.  Motor: Bulk and tone normal, muscle strength 5/5 throughout with no pronator drift.  Sensation to light touch intact.  No extinction to double simultaneous stimulation.  Deep tendon reflexes 2+ throughout, toes downgoing.  Finger to nose testing intact.  Gait narrow-based and steady, able to tandem walk adequately.  Romberg negative.  IMPRESSION: This is a pleasant 65 yo RH woman with a history of hypertension, hyperlipidemia, diet-controlled diabetes, neuropathy, who presented for worsening memory and headaches. Her MMSE in November 2016 was 30/30. MRI brain unremarkable. She did not notice much improvement with  amitriptyline 47m qhs for headaches, likely tension-type headaches. ESR and CRP normal. We discussed option to increase dose of amitriptyline versus starting gabapentin. She would like to try increasing amitriptyline first to 1051mqhs. Side effects were discussed. She will call our office in a month to update on her condition, if unchanged, would start gabapentin 10023mhs with uptitration. She reports a black spot/spiderweb in her  left eye, and will be referred to Ophthalmology. We discussed memory changes, the importance of physical exercise and brain stimulation exercises for brain health, as well as looking into the Mediterranean diet. She will follow-up in 6 months and knows to call for any changes.   Thank you for allowing me to participate in her care.  Please do not hesitate to call for any questions or concerns.  The duration of this appointment visit was 25 minutes of face-to-face time with the patient.  Greater than 50% of this time was spent in counseling, explanation of diagnosis, planning of further management, and coordination of care.   Ellouise Newer, M.D.   CC: Dr. Lorelei Pont

## 2016-02-18 NOTE — Patient Instructions (Signed)
Pt had comprehensive handout from home health PT: Today only added bridging  X 10

## 2016-02-19 ENCOUNTER — Ambulatory Visit: Payer: Medicare Other

## 2016-02-19 DIAGNOSIS — M25562 Pain in left knee: Secondary | ICD-10-CM | POA: Diagnosis not present

## 2016-02-19 DIAGNOSIS — R269 Unspecified abnormalities of gait and mobility: Secondary | ICD-10-CM | POA: Diagnosis not present

## 2016-02-19 NOTE — Therapy (Signed)
[place] MAIN [facility] SERVICES [address] [place], [place], [address] Phone: [phone]   Fax:  [phone]  Physical Therapy Treatment  Patient Details  Name: Amanda [name] MRN: [id number] Date of Birth: October 24, 1951 Referring Provider: Dr. Marlou Sa  Encounter Date: 02/19/2016      PT End of Session - 02/19/16 1646    Visit Number 2   Number of Visits 13   Date for PT Re-Evaluation 03/17/16   Authorization Type 2/10 g codes   PT Start Time [id number]   PT Stop Time 1700   PT Time Calculation (min) 45 min   Activity Tolerance Patient tolerated treatment well   Behavior During Therapy [facility] for tasks assessed/performed      Past Medical History  Diagnosis Date  . Depression   . Anxiety   . Hyperlipidemia   . GERD (gastroesophageal reflux disease)   . Tobacco abuse   . Smoker 05/10/2015  . Degenerative arthritis of knee, bilateral 06/16/2015  . Pneumonia     hx of this several times  . Heart murmur     slight per pt- benign   . Hypertension     hx of tachycardia   . History of tachycardia   . Thyroid disease     hyperthyroidism as a teenager - received some injections age 36 that corrected this   . Constipation     uses docusate and fiber  . Diabetes ([place])     diet managed-type 11  . Tachycardia   . Shortness of breath dyspnea     with exertion  . Headache   . Neuromuscular disorder ([place])     nerve damage in back and shoulder    Past Surgical History  Procedure Laterality Date  . Tonsillectomy      4 yoa  . Ovarian cyst removal  1972  . Abdominal hysterectomy  1993  . Wrist fracture surgery  11/2008  . Esophageal dilation      x 3  . Colonoscopy      30 years ago was normal per pt.   . Fractured leg Right   . Salivary gland surgery    . Cholecystectomy    . Back surgery    . Total knee arthroplasty Left 12/24/2015    Procedure: LEFT TOTAL KNEE ARTHROPLASTY;  Surgeon: Amanda Pel, MD;  Location: [facility];  Service:  Orthopedics;  Laterality: Left;    There were no vitals filed for this visit.  Visit Diagnosis:  Left knee pain  Abnormality of gait      Subjective Assessment - 02/19/16 1645    Subjective pt reports she is sore all over today, she thinks due to the weather. she did her HEP yesterday   Pertinent History hx of R knee OA. hx of depression    Diagnostic tests Xray   Patient Stated Goals pt would like to improve balance/mobility and stairs.    Currently in Pain? Yes   Pain Score 5    Pain Location --  L posterior knee     Manual therapy: Patellar mobs in all directions grade 3 2 bouts of 15s PA and PA tib femur mobs grade 5 bouts of 20s  PROM to L knee into knee flexion with small oscillations at end range x 10 reps Improved joint mobility following  PROM following : 0-120deg  Therex: Leg press SLL: 45lbs 2x10 TKE with ball on wall 3s 2x10 Fwd step up and down using LLE 4inch step  x 5 Standing resisted hip flexion, abduction, and extension   yellow  Band x 10 each way bilaterally Pt requires mod verbal and tactile cues for proper exercise performance  And for motivation to complete exercise   Followed by ice to L knee x 10 min no charge                             PT Education - 02/19/16 1646    Education provided Yes   Education Details diaphragmatic breathing during exercise    Person(s) Educated Patient   Comprehension Verbalized understanding             PT Long Term Goals - 02/18/16 0854    PT LONG TERM GOAL #1   Title pt will improve L knee ROM to at least 0-125 deg to be able to descend 3 stairs in step over step pattern using unilateral hand rail    Baseline 0-108deg   Time 6   Period Weeks   Status New   PT LONG TERM GOAL #2   Title pt will improve gait speed to at least 0.42m/s for normalized home mobility using LRAD   Time 6   Period Weeks   Status New   PT LONG TERM GOAL #3   Title pt will be independent and compliant to  advanced HEP for conintued home strengthening    Time 6   Period Weeks   Status New   PT LONG TERM GOAL #4   Title pt will decerase LEFS to less than 25% disability to maximize function                Plan - 02/19/16 1646    Clinical Impression Statement pt responded well to manual therapy today with increased PROM to 0-120 deg today following manual (110deg flexion prior). pt also did well with progression of strengthening. She did demonstrate  some anxiety about her pain. pt was re-assured this is a normal process of healing with a TKA    Pt will benefit from skilled therapeutic intervention in order to improve on the following deficits Abnormal gait;Decreased activity tolerance;Decreased balance;Decreased strength;Difficulty walking;Hypomobility;Pain;Impaired flexibility;Increased fascial restricitons   Rehab Potential Good   Clinical Impairments Affecting Rehab Potential hx of depression, hx of R knee OA, HTN   PT Frequency 2x / week   PT Duration 6 weeks   PT Treatment/Interventions Aquatic Therapy;Cryotherapy;Electrical Stimulation;Moist Heat;Ultrasound;Neuromuscular re-education;Balance training;Therapeutic exercise;Therapeutic activities;Functional mobility training;Stair training;Gait training;Dry needling;Taping   PT Next Visit Plan progress stregnth as tolerated    Consulted and Agree with Plan of Care Patient        Problem List Patient Active Problem List   Diagnosis Date Noted  . Tension headache 02/18/2016  . Degenerative arthritis of left knee 12/24/2015  . Left facial pain 10/29/2015  . Memory loss 10/29/2015  . Osteoarthritis Knees, bilateral, moderate 06/16/2015  . Smoker 05/10/2015  . Solitary pulmonary nodule 03/28/2013  . Cardiac murmur 03/15/2012  . Hypertension   . UNSPECIFIED VITAMIN D DEFICIENCY 05/29/2009  . Pure hypercholesterolemia 12/07/2007  . Major depressive disorder, recurrent episode, in partial remission ([place]) 12/07/2007  . Diabetes  mellitus type 2, controlled ([place]) 08/10/2007   Amanda [name], PT, DPT [phone]   Amanda [name] 02/19/2016, 5:04 PM  [place] MAIN [facility] SERVICES [address] [place], [place], [address] Phone: [phone]   Fax:  [phone]  Name: Amanda [name] MRN: [id number] Date  of Birth: 1951/09/17

## 2016-02-20 ENCOUNTER — Other Ambulatory Visit: Payer: Self-pay | Admitting: Family Medicine

## 2016-02-20 ENCOUNTER — Telehealth: Payer: Self-pay | Admitting: Family Medicine

## 2016-02-20 DIAGNOSIS — H43392 Other vitreous opacities, left eye: Secondary | ICD-10-CM

## 2016-02-20 NOTE — Telephone Encounter (Signed)
Returning your call please call back at [phone]

## 2016-02-20 NOTE — Telephone Encounter (Signed)
Appt scheduled with [facility]/Dr. Kathlen Mody on 02/21/16 @ 9:30 am.   [place], [place] [address]  Ph [phone] Fax [phone]

## 2016-02-20 NOTE — Telephone Encounter (Signed)
Spoke with patient and gave her all appt information.

## 2016-02-21 DIAGNOSIS — H25013 Cortical age-related cataract, bilateral: Secondary | ICD-10-CM | POA: Diagnosis not present

## 2016-02-21 DIAGNOSIS — H40013 Open angle with borderline findings, low risk, bilateral: Secondary | ICD-10-CM | POA: Diagnosis not present

## 2016-02-21 DIAGNOSIS — H43392 Other vitreous opacities, left eye: Secondary | ICD-10-CM | POA: Diagnosis not present

## 2016-02-21 DIAGNOSIS — H2513 Age-related nuclear cataract, bilateral: Secondary | ICD-10-CM | POA: Diagnosis not present

## 2016-02-25 ENCOUNTER — Ambulatory Visit: Payer: Medicare Other

## 2016-02-25 DIAGNOSIS — M25562 Pain in left knee: Secondary | ICD-10-CM | POA: Diagnosis not present

## 2016-02-25 DIAGNOSIS — R269 Unspecified abnormalities of gait and mobility: Secondary | ICD-10-CM

## 2016-02-25 NOTE — Therapy (Signed)
[place] MAIN [facility] SERVICES [address] [place], [place], [address] Phone: [phone]   Fax:  [phone]  Physical Therapy Treatment  Patient Details  Name: Amanda [name] MRN: [id number] Date of Birth: 01/06/1951 Referring Provider: Dr. Marlou Sa  Encounter Date: 02/25/2016      PT End of Session - 02/25/16 1636    Visit Number (p) 3   Number of Visits (p) 13   Date for PT Re-Evaluation (p) 03/17/16   Authorization Type (p) 3/10   PT Start Time (p) 1602   PT Stop Time (p) 1645   PT Time Calculation (min) (p) 43 min   Activity Tolerance (p) Patient tolerated treatment well   Behavior During Therapy (p) WFL for tasks assessed/performed      Past Medical History  Diagnosis Date  . Depression   . Anxiety   . Hyperlipidemia   . GERD (gastroesophageal reflux disease)   . Tobacco abuse   . Smoker 05/10/2015  . Degenerative arthritis of knee, bilateral 06/16/2015  . Pneumonia     hx of this several times  . Heart murmur     slight per pt- benign   . Hypertension     hx of tachycardia   . History of tachycardia   . Thyroid disease     hyperthyroidism as a teenager - received some injections age 54 that corrected this   . Constipation     uses docusate and fiber  . Diabetes ([place])     diet managed-type 11  . Tachycardia   . Shortness of breath dyspnea     with exertion  . Headache   . Neuromuscular disorder ([place])     nerve damage in back and shoulder    Past Surgical History  Procedure Laterality Date  . Tonsillectomy      4 yoa  . Ovarian cyst removal  1972  . Abdominal hysterectomy  1993  . Wrist fracture surgery  11/2008  . Esophageal dilation      x 3  . Colonoscopy      30 years ago was normal per pt.   . Fractured leg Right   . Salivary gland surgery    . Cholecystectomy    . Back surgery    . Total knee arthroplasty Left 12/24/2015    Procedure: LEFT TOTAL KNEE ARTHROPLASTY;  Surgeon: [name], MD;   Location: [facility];  Service: Orthopedics;  Laterality: Left;    There were no vitals filed for this visit.  Visit Diagnosis:  Left knee pain  Abnormality of gait      Subjective Assessment - 02/25/16 1635    Subjective pt reports she feels like is slowly improving    Pertinent History hx of R knee OA. hx of depression    Diagnostic tests Xray   Patient Stated Goals pt would like to improve balance/mobility and stairs.    Currently in Pain? Yes   Pain Score 4    Pain Location --  L knee         ThereX: Nustep for L knee ROM x 5 min L 4  Leg press BLE 90lbs 2x10 Leg press LLE only 45lbs x 15 TKE with red band 2x10 Standing resisted hip flexion, abduction ,adduction and extension with    red    Band x 10 each way BLE Standing hamstring stretch with max tactile verbal cues for anterior pelvic tilt  Pt requires min verbal and tactile cues for proper  exercise performance  , SBA for safety                          PT Education - 02/25/16 1636    Education provided Yes   Education Details exercise correction   Person(s) Educated Patient   Methods Explanation   Comprehension Verbalized understanding             PT Long Term Goals - 02/18/16 0854    PT LONG TERM GOAL #1   Title pt will improve L knee ROM to at least 0-125 deg to be able to descend 3 stairs in step over step pattern using unilateral hand rail    Baseline 0-108deg   Time 6   Period Weeks   Status New   PT LONG TERM GOAL #2   Title pt will improve gait speed to at least 0.11m/s for normalized home mobility using LRAD   Time 6   Period Weeks   Status New   PT LONG TERM GOAL #3   Title pt will be independent and compliant to advanced HEP for conintued home strengthening    Time 6   Period Weeks   Status New   PT LONG TERM GOAL #4   Title pt will decerase LEFS to less than 25% disability to maximize function                Plan - 02/25/16 1653    Clinical Impression  Statement pt progressing well through strengthening. she has some general deconditioning and underlying balance issues needing SBA for safety. pt had difficulty initiating anterior pelvic tilt today to aid with hamstring stretch and will likely need reinstruction.    Pt will benefit from skilled therapeutic intervention in order to improve on the following deficits Abnormal gait;Decreased activity tolerance;Decreased balance;Decreased strength;Difficulty walking;Hypomobility;Pain;Impaired flexibility;Increased fascial restricitons   Rehab Potential Good   Clinical Impairments Affecting Rehab Potential hx of depression, hx of R knee OA, HTN   PT Frequency 2x / week   PT Duration 6 weeks   PT Treatment/Interventions Aquatic Therapy;Cryotherapy;Electrical Stimulation;Moist Heat;Ultrasound;Neuromuscular re-education;Balance training;Therapeutic exercise;Therapeutic activities;Functional mobility training;Stair training;Gait training;Dry needling;Taping   PT Next Visit Plan progress stregnth as tolerated    Consulted and Agree with Plan of Care Patient        Problem List Patient Active Problem List   Diagnosis Date Noted  . Tension headache 02/18/2016  . Degenerative arthritis of left knee 12/24/2015  . Left facial pain 10/29/2015  . Memory loss 10/29/2015  . Osteoarthritis Knees, bilateral, moderate 06/16/2015  . Smoker 05/10/2015  . Solitary pulmonary nodule 03/28/2013  . Cardiac murmur 03/15/2012  . Hypertension   . UNSPECIFIED VITAMIN D DEFICIENCY 05/29/2009  . Pure hypercholesterolemia 12/07/2007  . Major depressive disorder, recurrent episode, in partial remission ([place]) 12/07/2007  . Diabetes mellitus type 2, controlled ([place]) 08/10/2007   [name], PT, DPT [phone]  Amanda [name] 02/25/2016, 4:54 PM  [place] MAIN [facility] SERVICES [address] [place], [place], [address] Phone: [phone]   Fax:  [phone]  Name: Amanda [name] MRN: [id number] Date of Birth: 04/15/1951

## 2016-02-27 ENCOUNTER — Ambulatory Visit: Payer: Medicare Other

## 2016-02-27 DIAGNOSIS — R269 Unspecified abnormalities of gait and mobility: Secondary | ICD-10-CM | POA: Diagnosis not present

## 2016-02-27 DIAGNOSIS — M25562 Pain in left knee: Secondary | ICD-10-CM | POA: Diagnosis not present

## 2016-02-27 NOTE — Therapy (Signed)
[place] MAIN [facility] SERVICES [address] [place], [place], [address] Phone: [phone]   Fax:  [phone]  Physical Therapy Treatment  Patient Details  Name: Amanda [name] MRN: [id number] Date of Birth: 07-17-1951 Referring Provider: Dr. Marlou Sa  Encounter Date: 02/27/2016      PT End of Session - 02/27/16 1531    Visit Number 4   Number of Visits 13   Date for PT Re-Evaluation 03/17/16   Authorization Type 4/10   PT Start Time 1515   PT Stop Time 1600   PT Time Calculation (min) 45 min   Activity Tolerance Patient tolerated treatment well   Behavior During Therapy [facility] for tasks assessed/performed      Past Medical History  Diagnosis Date  . Depression   . Anxiety   . Hyperlipidemia   . GERD (gastroesophageal reflux disease)   . Tobacco abuse   . Smoker 05/10/2015  . Degenerative arthritis of knee, bilateral 06/16/2015  . Pneumonia     hx of this several times  . Heart murmur     slight per pt- benign   . Hypertension     hx of tachycardia   . History of tachycardia   . Thyroid disease     hyperthyroidism as a teenager - received some injections age 66 that corrected this   . Constipation     uses docusate and fiber  . Diabetes ([place])     diet managed-type 11  . Tachycardia   . Shortness of breath dyspnea     with exertion  . Headache   . 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 Tonsillectomy      4 yoa  . Ovarian cyst removal  1972  . Abdominal hysterectomy  1993  . Wrist fracture surgery  11/2008  . Esophageal dilation      x 3  . Colonoscopy      30 years ago was normal per pt.   . Fractured leg Right   . Salivary gland surgery    . Cholecystectomy    . Back surgery    . Total knee arthroplasty Left 12/24/2015    Procedure: LEFT TOTAL KNEE ARTHROPLASTY;  Surgeon: [name], MD;  Location: [place];  Service:  Orthopedics;  Laterality: Left;    There were no vitals filed for this visit.  Visit Diagnosis:  Left knee pain  Abnormality of gait      Subjective Assessment - 02/27/16 1530    Subjective pt reports he hurts all over today.,   Pertinent History hx of R knee OA. hx of depression    Diagnostic tests Xray   Patient Stated Goals pt would like to improve balance/mobility and stairs.    Currently in Pain? Yes   Pain Score 3    Pain Orientation Right;Left  legs   Pain Descriptors / Indicators Aching         ThereX: Nustep L 3 x 5 min no charge warm up TKE with ball on wall 2x10 Mini wall slide modified for more emphasis on LLE due to R knee OA x 12 Standing resisted hip flexion, abduction ,adduction and extension with     red   Band x 10 each way  BLE - light UE support Side stepping with red band in // bars x 3 laps no UE fwd step up 6in  1x10 LLE  fwd step down 4inch step using LLE x 6 single railing                       PT Education - 02/27/16 1531    Education provided Yes   Education Details theraputic neuroscience education    Person(s) Educated Patient   Methods Explanation   Comprehension Verbalized understanding             PT Long Term Goals - 02/18/16 0854    PT LONG TERM GOAL #1   Title pt will improve L knee ROM to at least 0-125 deg to be able to descend 3 stairs in step over step pattern using unilateral hand rail    Baseline 0-108deg   Time 6   Period Weeks   Status New   PT LONG TERM GOAL #2   Title pt will improve gait speed to at least 0.49m/s for normalized home mobility using LRAD   Time 6   Period Weeks   Status New   PT LONG TERM GOAL #3   Title pt will be independent and compliant to advanced HEP for conintued home strengthening    Time 6   Period Weeks   Status New   PT LONG TERM GOAL #4   Title pt will decerase LEFS to less than 25% disability to maximize function                Plan - 02/27/16 1541     Clinical Impression Statement pt demonstrates impaired balance with her exercises. she has difficulty shiting weight with acitivites that involve a single leg stance time such as side stepping or a 4 way hip.    Pt will benefit from skilled therapeutic intervention in order to improve on the following deficits Abnormal gait;Decreased activity tolerance;Decreased balance;Decreased strength;Difficulty walking;Hypomobility;Pain;Impaired flexibility;Increased fascial restricitons   Rehab Potential Good   Clinical Impairments Affecting Rehab Potential hx of depression, hx of R knee OA, HTN   PT Frequency 2x / week   PT Duration 6 weeks   PT Treatment/Interventions Aquatic Therapy;Cryotherapy;Electrical Stimulation;Moist Heat;Ultrasound;Neuromuscular re-education;Balance training;Therapeutic exercise;Therapeutic activities;Functional mobility training;Stair training;Gait training;Dry needling;Taping   PT Next Visit Plan progress stregnth as tolerated    Consulted and Agree with Plan of Care Patient        Problem List Patient Active Problem List   Diagnosis Date Noted  . Tension headache 02/18/2016  . Degenerative arthritis of left knee 12/24/2015  . Left facial pain 10/29/2015  . Memory loss 10/29/2015  . Osteoarthritis Knees, bilateral, moderate 06/16/2015  . Smoker 05/10/2015  . Solitary pulmonary nodule 03/28/2013  . Cardiac murmur 03/15/2012  . Hypertension   . UNSPECIFIED VITAMIN D DEFICIENCY 05/29/2009  . Pure hypercholesterolemia 12/07/2007  . Major depressive disorder, recurrent episode, in partial remission ([place]) 12/07/2007  . Diabetes mellitus type 2, controlled ([place]) 08/10/2007   Amanda [name], PT, DPT [phone]  Amanda [name] 02/27/2016, 3:45 PM  [place] MAIN [facility] SERVICES [address] [place], [place], [address] Phone: [phone]   Fax:  [phone]  Name: Amanda [name] MRN: [id number] Date of Birth:  July 23, 1951

## 2016-03-03 ENCOUNTER — Ambulatory Visit: Payer: Medicare Other

## 2016-03-03 DIAGNOSIS — M25562 Pain in left knee: Secondary | ICD-10-CM

## 2016-03-03 DIAGNOSIS — R269 Unspecified abnormalities of gait and mobility: Secondary | ICD-10-CM | POA: Diagnosis not present

## 2016-03-03 NOTE — Therapy (Signed)
[place] MAIN [facility] SERVICES [address] [place], [place], [address] Phone: [phone]   Fax:  [phone]  Physical Therapy Treatment  Patient Details  Name: Amanda [name] MRN: [id number] Date of Birth: Jun 20, 1951 Referring Provider: Dr. Marlou Sa  Encounter Date: 03/03/2016      PT End of Session - 03/03/16 1624    Visit Number 5   Number of Visits 13   Date for PT Re-Evaluation 03/17/16   Authorization Type 5/10   PT Start Time 1605   PT Stop Time 1645   PT Time Calculation (min) 40 min   Activity Tolerance Patient tolerated treatment well   Behavior During Therapy [facility] for tasks assessed/performed      Past Medical History  Diagnosis Date  . Depression   . Anxiety   . Hyperlipidemia   . GERD (gastroesophageal reflux disease)   . Tobacco abuse   . Smoker 05/10/2015  . Degenerative arthritis of knee, bilateral 06/16/2015  . Pneumonia     hx of this several times  . Heart murmur     slight per pt- benign   . Hypertension     hx of tachycardia   . History of tachycardia   . Thyroid disease     hyperthyroidism as a teenager - received some injections age 65 that corrected this   . Constipation     uses docusate and fiber  . Diabetes ([place])     diet managed-type 11  . Tachycardia   . Shortness of breath dyspnea     with exertion  . Headache   . 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 Tonsillectomy      4 yoa  . Ovarian cyst removal  1972  . Abdominal hysterectomy  1993  . Wrist fracture surgery  11/2008  . Esophageal dilation      x 3  . Colonoscopy      30 years ago was normal per pt.   . Fractured leg Right   . Salivary gland surgery    . Cholecystectomy    . Back surgery    . Total knee arthroplasty Left 12/24/2015    Procedure: LEFT TOTAL KNEE ARTHROPLASTY;  Surgeon: [name], MD;  Location: [place];  Service:  Orthopedics;  Laterality: Left;    There were no vitals filed for this visit.  Visit Diagnosis:  Abnormality of gait  Left knee pain      Subjective Assessment - 03/03/16 1622    Subjective pt reports her L leg hurt really bad after last session that she layed around all weekend due to pain.    Pertinent History hx of R knee OA. hx of depression    Diagnostic tests Xray   Patient Stated Goals pt would like to improve balance/mobility and stairs.    Currently in Pain? Yes   Pain Score 5    Pain Location --  L posterior knee      Rec bike x 2 min during history no charge SLL leg press 60lbs 2x10 Staggered stance (LLE behind) weight shift with R ankle DF x 20 Standing resisted hip flexion, abduction , and extension with   red    Band x 10 each way LLE stance only light UE support side stepping with red band x 3  Laps in // bars  single leg long sitting hamstring stretch 20s  x 3 Standing on AIREX NBOS EO/EC 10s x 15 Pt requires min verbal and tactile cues for proper exercise performance                          PT Education - 03/03/16 1624    Education provided Yes   Education Details time frame of healing, PT modifiication due to pain   Person(s) Educated Patient   Methods Explanation   Comprehension Verbalized understanding             PT Long Term Goals - 02/18/16 0854    PT LONG TERM GOAL #1   Title pt will improve L knee ROM to at least 0-125 deg to be able to descend 3 stairs in step over step pattern using unilateral hand rail    Baseline 0-108deg   Time 6   Period Weeks   Status New   PT LONG TERM GOAL #2   Title pt will improve gait speed to at least 0.67m/s for normalized home mobility using LRAD   Time 6   Period Weeks   Status New   PT LONG TERM GOAL #3   Title pt will be independent and compliant to advanced HEP for conintued home strengthening    Time 6   Period Weeks   Status New   PT LONG TERM GOAL #4   Title pt will  decerase LEFS to less than 25% disability to maximize function                Plan - 03/03/16 1632    Clinical Impression Statement PT modifed pts treatment today due to long duration of increased pain after last session which affected her ADLs. pt was educated with seated hamstring stretch today and standing weight shift in staggered stance to initiate better quad control    Pt will benefit from skilled therapeutic intervention in order to improve on the following deficits Abnormal gait;Decreased activity tolerance;Decreased balance;Decreased strength;Difficulty walking;Hypomobility;Pain;Impaired flexibility;Increased fascial restricitons   Rehab Potential Good   Clinical Impairments Affecting Rehab Potential hx of depression, hx of R knee OA, HTN   PT Frequency 2x / week   PT Duration 6 weeks   PT Treatment/Interventions Aquatic Therapy;Cryotherapy;Electrical Stimulation;Moist Heat;Ultrasound;Neuromuscular re-education;Balance training;Therapeutic exercise;Therapeutic activities;Functional mobility training;Stair training;Gait training;Dry needling;Taping   PT Next Visit Plan progress stregnth as tolerated    Consulted and Agree with Plan of Care Patient        Problem List Patient Active Problem List   Diagnosis Date Noted  . Tension headache 02/18/2016  . Degenerative arthritis of left knee 12/24/2015  . Left facial pain 10/29/2015  . Memory loss 10/29/2015  . Osteoarthritis Knees, bilateral, moderate 06/16/2015  . Smoker 05/10/2015  . Solitary pulmonary nodule 03/28/2013  . Cardiac murmur 03/15/2012  . Hypertension   . UNSPECIFIED VITAMIN D DEFICIENCY 05/29/2009  . Pure hypercholesterolemia 12/07/2007  . Major depressive disorder, recurrent episode, in partial remission ([place]) 12/07/2007  . Diabetes mellitus type 2, controlled ([place]) 08/10/2007  Caryl Pina C. Marlisa Caridi, PT, DPT [phone]   Duayne Brideau 03/03/2016, 4:50 PM  [place]  MAIN [facility] SERVICES [address] [place], [place], [address] Phone: [phone]   Fax:  [phone]  Name: Amanda [name] MRN: [id number] Date of Birth: [birth year]

## 2016-03-05 ENCOUNTER — Ambulatory Visit: Payer: Medicare Other

## 2016-03-05 DIAGNOSIS — R269 Unspecified abnormalities of gait and mobility: Secondary | ICD-10-CM | POA: Diagnosis not present

## 2016-03-05 DIAGNOSIS — M25562 Pain in left knee: Secondary | ICD-10-CM

## 2016-03-05 NOTE — Therapy (Signed)
[place] MAIN [facility] SERVICES [address] [place], [place], [address] Phone: [phone]   Fax:  [phone]  Physical Therapy Treatment  Patient Details  Name: Amanda [name] MRN: [id number] Date of Birth: Jul 04, 1951 Referring Provider: Dr. Marlou Sa  Encounter Date: 03/05/2016      PT End of Session - 03/05/16 1821    Visit Number 6   Number of Visits 13   Date for PT Re-Evaluation 03/17/16   Authorization Type 6/10   PT Start Time 1600   PT Stop Time 1645   PT Time Calculation (min) 45 min   Activity Tolerance Patient tolerated treatment well   Behavior During Therapy [facility] for tasks assessed/performed      Past Medical History  Diagnosis Date  . Depression   . Anxiety   . Hyperlipidemia   . GERD (gastroesophageal reflux disease)   . Tobacco abuse   . Smoker 05/10/2015  . Degenerative arthritis of knee, bilateral 06/16/2015  . Pneumonia     hx of this several times  . Heart murmur     slight per pt- benign   . Hypertension     hx of tachycardia   . History of tachycardia   . Thyroid disease     hyperthyroidism as a teenager - received some injections age 89 that corrected this   . Constipation     uses docusate and fiber  . Diabetes ([place])     diet managed-type 11  . Tachycardia   . Shortness of breath dyspnea     with exertion  . Headache   . 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 Tonsillectomy      4 yoa  . Ovarian cyst removal  1972  . Abdominal hysterectomy  1993  . Wrist fracture surgery  11/2008  . Esophageal dilation      x 3  . Colonoscopy      30 years ago was normal per pt.   . Fractured leg Right   . Salivary gland surgery    . Cholecystectomy    . Back surgery    . Total knee arthroplasty Left 12/24/2015    Procedure: LEFT TOTAL KNEE ARTHROPLASTY;  Surgeon: [name], MD;  Location: [place];  Service:  Orthopedics;  Laterality: Left;    There were no vitals filed for this visit.  Visit Diagnosis:  Abnormality of gait  Left knee pain      Subjective Assessment - 03/05/16 1729    Subjective (p) pt reports her sciatica is really bothering her. she reports the knee is feeling better.    Pertinent History (p) hx of R knee OA. hx of depression    Diagnostic tests (p) Xray   Patient Stated Goals (p) pt would like to improve balance/mobility and stairs.    Currently in Pain? (p) Yes   Pain Score (p) 5    Pain Location (p) --  L hip to knee     manual therapy: AP/PA tib/femur glides grade 3 3 bouts 30s each Hamstring stretching 30s x 3 Piriformis stretching 20s x 3 Patella mobs grade 3 15s x 2 each direction PROM into knee flexion with small oscillations at end range x 5 Pt reports little relief with manual therapy  Therex: Long sitting hamstring stretch 30s x 2 BOSU fwd /side lunge x 10 each LLE only Weight shift in  staggered stance LLE behind x 20 Fwd step up 4in x 10 LLE only Fwd step down 4in x 10 LLE only Standing resisted hip flexion, abduction ,adduction and extension with   yellow     Band x 10 each way BLE Pt requires min verbal and tactile cues for proper exercise performance                                   PT Long Term Goals - 02/18/16 0854    PT LONG TERM GOAL #1   Title pt will improve L knee ROM to at least 0-125 deg to be able to descend 3 stairs in step over step pattern using unilateral hand rail    Baseline 0-108deg   Time 6   Period Weeks   Status New   PT LONG TERM GOAL #2   Title pt will improve gait speed to at least 0.29m/s for normalized home mobility using LRAD   Time 6   Period Weeks   Status New   PT LONG TERM GOAL #3   Title pt will be independent and compliant to advanced HEP for conintued home strengthening    Time 6   Period Weeks   Status New   PT LONG TERM GOAL #4   Title pt will decerase LEFS to less  than 25% disability to maximize function                Plan - 03/05/16 1821    Clinical Impression Statement pt did not have much positive effect from manual therapy today. pt is limited somewhat by her sciatic pain which also did not respond much to directional preference, hamstring stretching, or neural glides. pt does push through her pain somewhat to do her exercises.    Pt will benefit from skilled therapeutic intervention in order to improve on the following deficits Abnormal gait;Decreased activity tolerance;Decreased balance;Decreased strength;Difficulty walking;Hypomobility;Pain;Impaired flexibility;Increased fascial restricitons   Rehab Potential Good   Clinical Impairments Affecting Rehab Potential hx of depression, hx of R knee OA, HTN   PT Frequency 2x / week   PT Duration 6 weeks   PT Treatment/Interventions Aquatic Therapy;Cryotherapy;Electrical Stimulation;Moist Heat;Ultrasound;Neuromuscular re-education;Balance training;Therapeutic exercise;Therapeutic activities;Functional mobility training;Stair training;Gait training;Dry needling;Taping   PT Next Visit Plan progress stregnth as tolerated    Consulted and Agree with Plan of Care Patient        Problem List Patient Active Problem List   Diagnosis Date Noted  . Tension headache 02/18/2016  . Degenerative arthritis of left knee 12/24/2015  . Left facial pain 10/29/2015  . Memory loss 10/29/2015  . Osteoarthritis Knees, bilateral, moderate 06/16/2015  . Smoker 05/10/2015  . Solitary pulmonary nodule 03/28/2013  . Cardiac murmur 03/15/2012  . Hypertension   . UNSPECIFIED VITAMIN D DEFICIENCY 05/29/2009  . Pure hypercholesterolemia 12/07/2007  . Major depressive disorder, recurrent episode, in partial remission ([place]) 12/07/2007  . Diabetes mellitus type 2, controlled ([place]) 08/10/2007    Rebeccah Ivins 03/05/2016, 6:22 PM  [place] MAIN [facility] SERVICES [address]  [place], [place], [address] Phone: [phone]   Fax:  [phone]  Name: Amanda [name] MRN: [id number] Date of Birth: 1951/06/14

## 2016-03-06 ENCOUNTER — Other Ambulatory Visit: Payer: Self-pay | Admitting: Family Medicine

## 2016-03-08 ENCOUNTER — Other Ambulatory Visit: Payer: Self-pay | Admitting: Nurse Practitioner

## 2016-03-09 ENCOUNTER — Ambulatory Visit: Payer: Medicare Other | Attending: Orthopedic Surgery

## 2016-03-09 DIAGNOSIS — R2681 Unsteadiness on feet: Secondary | ICD-10-CM | POA: Diagnosis not present

## 2016-03-09 DIAGNOSIS — M25562 Pain in left knee: Secondary | ICD-10-CM | POA: Insufficient documentation

## 2016-03-09 DIAGNOSIS — R269 Unspecified abnormalities of gait and mobility: Secondary | ICD-10-CM | POA: Insufficient documentation

## 2016-03-10 NOTE — Therapy (Signed)
[place] MAIN [facility] SERVICES [place], [place], [address] Phone: [phone]   Fax:  [phone]  Physical Therapy Treatment  Patient Details  Name: Amanda [name] MRN: [id number] Date of Birth: 1951/02/09 Referring Provider: Dr. Marlou Sa  Encounter Date: 03/09/2016      PT End of Session - 03/10/16 0757    Visit Number 7   Number of Visits 13   Date for PT Re-Evaluation 03/17/16   Authorization Type 7/10   PT Start Time 1515   PT Stop Time 1600   PT Time Calculation (min) 45 min   Equipment Utilized During Treatment Gait belt   Activity Tolerance Patient tolerated treatment well   Behavior During Therapy [facility] for tasks assessed/performed      Past Medical History  Diagnosis Date  . Depression   . Anxiety   . Hyperlipidemia   . GERD (gastroesophageal reflux disease)   . Tobacco abuse   . Smoker 05/10/2015  . Degenerative arthritis of knee, bilateral 06/16/2015  . Pneumonia     hx of this several times  . Heart murmur     slight per pt- benign   . Hypertension     hx of tachycardia   . History of tachycardia   . Thyroid disease     hyperthyroidism as a teenager - received some injections age 80 that corrected this   . Constipation     uses docusate and fiber  . Diabetes ([place])     diet managed-type 11  . Tachycardia   . Shortness of breath dyspnea     with exertion  . Headache   . 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 Tonsillectomy      4 yoa  . Ovarian cyst removal  1972  . Abdominal hysterectomy  1993  . Wrist fracture surgery  11/2008  . Esophageal dilation      x 3  . Colonoscopy      30 years ago was normal per pt.   . Fractured leg Right   . Salivary gland surgery    . Cholecystectomy    . Back surgery    . Total knee arthroplasty Left 12/24/2015    Procedure: LEFT TOTAL KNEE ARTHROPLASTY;  Surgeon: [name], MD;  Location: [place];  Service: Orthopedics;  Laterality: Left;    There were no vitals filed for this visit.  Visit Diagnosis:  Abnormality of gait  Left knee pain      Subjective Assessment - 03/10/16 0756    Subjective pt reports her knee is feeling better each week    Pertinent History hx of R knee OA. hx of depression    Diagnostic tests Xray   Patient Stated Goals pt would like to improve balance/mobility and stairs.    Currently in Pain? Yes   Pain Score 3    Pain Location --  L posterior knee   Pain Descriptors / Indicators Aching        therex: Rec bike L 2 x 5 min no charge warm up TKE with ball on wall 2x20 LLE Standing resisted hip flexion, abduction , and extension with   yellow     Band x 12 each way BLE fwd step up onto AIREX and down LLE only no UE 2x10 Eccentric step down from 4in step LLE only x  10 Pt requires  min verbal and tactile cues for proper exercise performance   NMR: Standing on AIREX with ball toss on wall 2x20 Standing on AIREX with hoiz / vertical head turns 2x10 each way Trunk turns with small ball standing on AIREX x 25  pt requires CGA for safety on balance exercises                          PT Education - 03/10/16 0757    Education provided Yes   Education Details minimize compensatory patterns on therex   Person(s) Educated Patient   Methods Explanation   Comprehension Verbalized understanding             PT Long Term Goals - 02/18/16 0854    PT LONG TERM GOAL #1   Title pt will improve L knee ROM to at least 0-125 deg to be able to descend 3 stairs in step over step pattern using unilateral hand rail    Baseline 0-108deg   Time 6   Period Weeks   Status New   PT LONG TERM GOAL #2   Title pt will improve gait speed to at least 0.71m/s for normalized home mobility using LRAD   Time 6   Period Weeks   Status New   PT LONG TERM GOAL #3   Title pt will be independent and compliant to advanced  HEP for conintued home strengthening    Time 6   Period Weeks   Status New   PT LONG TERM GOAL #4   Title pt will decerase LEFS to less than 25% disability to maximize function                Plan - 03/10/16 0758    Clinical Impression Statement PT progressed balance/ proprioceptive exercises. pt does demonstrate moderate balance deficits thay may be pre-morbid. pt reports likely due to decrease in activity.    Pt will benefit from skilled therapeutic intervention in order to improve on the following deficits Abnormal gait;Decreased activity tolerance;Decreased balance;Decreased strength;Difficulty walking;Hypomobility;Pain;Impaired flexibility;Increased fascial restricitons   Rehab Potential Good   Clinical Impairments Affecting Rehab Potential hx of depression, hx of R knee OA, HTN   PT Frequency 2x / week   PT Duration 6 weeks   PT Treatment/Interventions Aquatic Therapy;Cryotherapy;Electrical Stimulation;Moist Heat;Ultrasound;Neuromuscular re-education;Balance training;Therapeutic exercise;Therapeutic activities;Functional mobility training;Stair training;Gait training;Dry needling;Taping   PT Next Visit Plan progress stregnth as tolerated    Consulted and Agree with Plan of Care Patient        Problem List Patient Active Problem List   Diagnosis Date Noted  . Tension headache 02/18/2016  . Degenerative arthritis of left knee 12/24/2015  . Left facial pain 10/29/2015  . Memory loss 10/29/2015  . Osteoarthritis Knees, bilateral, moderate 06/16/2015  . Smoker 05/10/2015  . Solitary pulmonary nodule 03/28/2013  . Cardiac murmur 03/15/2012  . Hypertension   . UNSPECIFIED VITAMIN D DEFICIENCY 05/29/2009  . Pure hypercholesterolemia 12/07/2007  . Major depressive disorder, recurrent episode, in partial remission ([place]) 12/07/2007  . Diabetes mellitus type 2, controlled ([place]) 08/10/2007   Amanda [name], PT, DPT [phone]  Dameir Gentzler 03/10/2016, 7:59 AM  [place] MAIN [facility] SERVICES [address] [place], [place], [address] Phone: [phone]   Fax:  [phone]  Name: Amanda [name] MRN: [id number] Date of Birth: 06/27/1951

## 2016-03-11 ENCOUNTER — Ambulatory Visit: Payer: Medicare Other

## 2016-03-11 VITALS — BP 126/78 | HR 87

## 2016-03-11 DIAGNOSIS — M25562 Pain in left knee: Secondary | ICD-10-CM | POA: Diagnosis not present

## 2016-03-11 DIAGNOSIS — R2681 Unsteadiness on feet: Secondary | ICD-10-CM | POA: Diagnosis not present

## 2016-03-11 DIAGNOSIS — R269 Unspecified abnormalities of gait and mobility: Secondary | ICD-10-CM

## 2016-03-11 NOTE — Therapy (Signed)
[place] MAIN [facility] SERVICES [address] [place], [place], [address] Phone: [phone]   Fax:  [phone]  Physical Therapy Treatment  Patient Details  Name: Amanda [name] MRN: [id number] Date of Birth: [birth year] Referring Provider: Dr. Marlou Sa  Encounter Date: 03/11/2016      PT End of Session - 03/11/16 1528    Visit Number 8   Number of Visits 13   Date for PT Re-Evaluation 03/17/16   Authorization Type 8/10   PT Start Time 1518   PT Stop Time 1600   PT Time Calculation (min) 42 min   Equipment Utilized During Treatment Gait belt   Activity Tolerance Patient tolerated treatment well   Behavior During Therapy [facility] for tasks assessed/performed      Past Medical History  Diagnosis Date  . Depression   . Anxiety   . Hyperlipidemia   . GERD (gastroesophageal reflux disease)   . Tobacco abuse   . Smoker 05/10/2015  . Degenerative arthritis of knee, bilateral 06/16/2015  . Pneumonia     hx of this several times  . Heart murmur     slight per pt- benign   . Hypertension     hx of tachycardia   . History of tachycardia   . Thyroid disease     hyperthyroidism as a teenager - received some injections age 65 that corrected this   . Constipation     uses docusate and fiber  . Diabetes ([place])     diet managed-type 11  . Tachycardia   . Shortness of breath dyspnea     with exertion  . Headache   . 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 Tonsillectomy      65 yoa  . Ovarian cyst removal  1972  . Abdominal hysterectomy  1993  . Wrist fracture surgery  11/2008  . Esophageal dilation      x 3  . Colonoscopy      30 years ago was normal per pt.   . Fractured leg Right   . Salivary gland surgery    . Cholecystectomy    . Back surgery    . Total knee arthroplasty Left 12/24/2015    Procedure: LEFT TOTAL KNEE ARTHROPLASTY;  Surgeon: [name], MD;  Location: [place];  Service: Orthopedics;  Laterality: Left;    Filed Vitals:   03/11/16 1520  BP: 126/78  Pulse: 87  SpO2: 98%    Visit Diagnosis:  Abnormality of gait  Left knee pain      Subjective Assessment - 03/11/16 1520    Subjective Pt states that she bent over the sink to wash her hair yesterday and afterward she was having some bilateral posterior knee soreness/pain. She took a pain pill last night for the first time in a while to help with the pain. She rates pain currently as 5/10 bilateral posterior knees. She is performing HEP without questions or concerns at this time. Pt reports that her husband brought an exercise bike out of storage for her to use at home.    Pertinent History hx of R knee OA. hx of depression    Diagnostic tests Xray   Patient Stated Goals pt would like to improve balance/mobility and stairs.    Currently in Pain? Yes   Pain Score 5    Pain Location Knee   Pain Orientation  Right;Left        Therex:  Rec bike x 5 min for warm-up during history  Forward step-ups to 6" step leading with LLE up and RLE down x 10; Side step-ups to 6" step leading with LLE up and RLE down x 10; Eccentric step down from 4in step LLE only x  10; Standing heel raises x 10; TKE with red Tband 2 x 10 LLE; Standing resisted hip flexion, abduction, and extension with 3# ankle weight x 12 each way LLE only; Standing L knee HS curl 3# ankle weight x 12; Pt requires min verbal and tactile cues for proper exercise performance                           PT Education - 03/11/16 1528    Education provided Yes   Education Details HEP reinforced   Person(s) Educated Patient   Methods Explanation   Comprehension Verbalized understanding             PT Long Term Goals - 02/18/16 0854    PT LONG TERM GOAL #1   Title pt will improve L knee ROM to at least 0-125 deg to be able to descend 3 stairs in step over step pattern using  unilateral hand rail    Baseline 0-108deg   Time 6   Period Weeks   Status New   PT LONG TERM GOAL #2   Title pt will improve gait speed to at least 0.43m/s for normalized home mobility using LRAD   Time 6   Period Weeks   Status New   PT LONG TERM GOAL #3   Title pt will be independent and compliant to advanced HEP for conintued home strengthening    Time 6   Period Weeks   Status New   PT LONG TERM GOAL #4   Title pt will decerase LEFS to less than 25% disability to maximize function                Plan - 03/11/16 1528    Clinical Impression Statement Pt demonstrates decreased confidence in strength and balance while in single leg stance on LLE. She reports at end of sesion some increase in R knee pain but does not complain of pain during session. No increase in L knee pain with all activities. Discussed modified positions (supine, prone, and sidelying) that pt could utilize to perform hip strengthening with ankle weights at home. Continue HEP and follow-up as scheduled.    Pt will benefit from skilled therapeutic intervention in order to improve on the following deficits Abnormal gait;Decreased activity tolerance;Decreased balance;Decreased strength;Difficulty walking;Hypomobility;Pain;Impaired flexibility;Increased fascial restricitons   Rehab Potential Good   Clinical Impairments Affecting Rehab Potential hx of depression, hx of R knee OA, HTN   PT Frequency 2x / week   PT Duration 6 weeks   PT Treatment/Interventions Aquatic Therapy;Cryotherapy;Electrical Stimulation;Moist Heat;Ultrasound;Neuromuscular re-education;Balance training;Therapeutic exercise;Therapeutic activities;Functional mobility training;Stair training;Gait training;Dry needling;Taping   PT Next Visit Plan Outcome meausres, update goals, recert?, progress stregnth as tolerated    PT Home Exercise Plan As prescribed   Consulted and Agree with Plan of Care Patient        Problem List Patient Active  Problem List   Diagnosis Date Noted  . Tension headache 02/18/2016  . Degenerative arthritis of left knee 12/24/2015  . Left facial pain 10/29/2015  . Memory loss 10/29/2015  . Osteoarthritis Knees, bilateral, moderate 06/16/2015  . Smoker 05/10/2015  .  Solitary pulmonary nodule 03/28/2013  . Cardiac murmur 03/15/2012  . Hypertension   . UNSPECIFIED VITAMIN D DEFICIENCY 05/29/2009  . Pure hypercholesterolemia 12/07/2007  . Major depressive disorder, recurrent episode, in partial remission ([place]) 12/07/2007  . Diabetes mellitus type 2, controlled ([place]) 08/10/2007   Phillips Grout PT, DPT   Julious Langlois 03/11/2016, 4:12 PM  [place] MAIN [facility] SERVICES [address] [place], [place], [address] Phone: [phone]   Fax:  [phone]  Name: CHRISTEAN REZEK MRN: [id number] Date of Birth: 12-24-1950

## 2016-03-16 ENCOUNTER — Ambulatory Visit: Payer: Medicare Other

## 2016-03-16 ENCOUNTER — Other Ambulatory Visit: Payer: Self-pay | Admitting: Nurse Practitioner

## 2016-03-16 DIAGNOSIS — M25562 Pain in left knee: Secondary | ICD-10-CM | POA: Diagnosis not present

## 2016-03-16 DIAGNOSIS — R2681 Unsteadiness on feet: Secondary | ICD-10-CM | POA: Diagnosis not present

## 2016-03-16 DIAGNOSIS — R269 Unspecified abnormalities of gait and mobility: Secondary | ICD-10-CM

## 2016-03-16 NOTE — Therapy (Addendum)
[place] MAIN [facility] SERVICES [address] [place], [place], [address] Phone: [phone]   Fax:  [phone]  Physical Therapy Treatment / progress note   Patient Details  Name: Amanda [name] MRN: [id number] Date of Birth: [birth year] Referring Provider: Dr. Marlou Sa  Encounter Date: 03/16/2016      PT End of Session - 03/16/16 1138    Visit Number 9   Number of Visits 13   Date for PT Re-Evaluation 04/27/16   Authorization Type 1/10   PT Start Time 1100   PT Stop Time 1145   PT Time Calculation (min) 45 min   Equipment Utilized During Treatment Gait belt   Activity Tolerance Patient tolerated treatment well   Behavior During Therapy [facility] for tasks assessed/performed      Past Medical History  Diagnosis Date  . Depression   . Anxiety   . Hyperlipidemia   . GERD (gastroesophageal reflux disease)   . Tobacco abuse   . Smoker 05/10/2015  . Degenerative arthritis of knee, bilateral 06/16/2015  . Pneumonia     hx of this several times  . Heart murmur     slight per pt- benign   . Hypertension     hx of tachycardia   . History of tachycardia   . Thyroid disease     hyperthyroidism as a teenager - received some injections age 56 that corrected this   . Constipation     uses docusate and fiber  . Diabetes ([place])     diet managed-type 11  . Tachycardia   . Shortness of breath dyspnea     with exertion  . Headache   . 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 Tonsillectomy      4 yoa  . Ovarian cyst removal  1972  . Abdominal hysterectomy  1993  . Wrist fracture surgery  11/2008  . Esophageal dilation      x 3  . Colonoscopy      30 years ago was normal per pt.   . Fractured leg Right   . Salivary gland surgery    . Cholecystectomy    . Back surgery    . Total knee arthroplasty Left 12/24/2015    Procedure: LEFT TOTAL KNEE ARTHROPLASTY;   Surgeon: [name], MD;  Location: [place];  Service: Orthopedics;  Laterality: Left;    There were no vitals filed for this visit.      Subjective Assessment - 03/16/16 1137    Subjective pt reports she feels like she has made progress, but is not as good as she would like to be regarding the L lknee   Pertinent History hx of R knee OA. hx of depression    Diagnostic tests Xray   Patient Stated Goals pt would like to improve balance/mobility and stairs.    Currently in Pain? Yes   Pain Score 3    Pain Orientation Left   Pain Descriptors / Indicators --  knee      Therex"       [facility] PT Assessment - 03/16/16 0001    Standardized Balance Assessment   Standardized Balance Assessment Berg Balance Test;10 meter walk test;Five Times Sit to Stand   Five times sit to stand comments  37s   10 Meter Walk 0.63ms   Berg Balance Test   Sit to Stand Able to stand  without using hands and stabilize independently   Standing Unsupported Able to stand safely 2 minutes   Sitting with Back Unsupported but Feet Supported on Floor or Stool Able to sit safely and securely 2 minutes   Stand to Sit Sits safely with minimal use of hands   Transfers Able to transfer safely, minor use of hands   Standing Unsupported with Eyes Closed Able to stand 10 seconds with supervision   Standing Ubsupported with Feet Together Able to place feet together independently and stand for 1 minute with supervision   From Standing, Reach Forward with Outstretched Arm Can reach confidently >25 cm (10")   From Standing Position, Pick up Object from Floor Able to pick up shoe, needs supervision   From Standing Position, Turn to Look Behind Over each Shoulder Looks behind one side only/other side shows less weight shift   Turn 360 Degrees Able to turn 360 degrees safely one side only in 4 seconds or less   Standing Unsupported, Alternately Place Feet on Step/Stool Able to complete 4 steps without aid or supervision    Standing Unsupported, One Foot in Front Able to plae foot ahead of the other independently and hold 30 seconds   Standing on One Leg Able to lift leg independently and hold equal to or more than 3 seconds   Total Score 46    PT reassessed outcome measures and progress towards goals.  Pt ascends steps 1 stair at a time. Quite labored. Impaired L knee control with descent.    LEFS: 37/80                       PT Education - 03/16/16 1138    Education provided Yes   Education Details progress towards goals, plan of care   Person(s) Educated Patient   Methods Explanation   Comprehension Verbalized understanding             PT Long Term Goals - 03/16/16 1204    PT LONG TERM GOAL #1   Title pt will improve L knee ROM to at least 0-125 deg to be able to descend 3 stairs in step over step pattern using unilateral hand rail    Baseline 0-115 deg.    Time 6   Period Weeks   Status Partially Met   PT LONG TERM GOAL #2   Title pt will improve gait speed to at least 0.35ms for normalized home mobility using LRAD   Time 6   Period Weeks   Status Partially Met   PT LONG TERM GOAL #3   Title pt will be independent and compliant to advanced HEP for conintued home strengthening    Time 6   Period Weeks   Status Achieved   PT LONG TERM GOAL #4   Title pt will decerase LEFS to less than 25% disability to maximize function    Status Partially Met   PT LONG TERM GOAL #5   Title pt will improve berg balance score by 6pts to reduce fall risk    Time 6   Period Weeks   Status New   Additional Long Term Goals   Additional Long Term Goals Yes   PT LONG TERM GOAL #6   Title pt will perform 5x sit to stand in <25s demonstrating improved LE strength.    Time 6   Period Weeks   Status New               Plan -  04/12/16 1139    Clinical Impression Statement pt has made good progress toward goals following L TKA. she has good ROM, however still demonstrates impaired  LE strength, gait speed, balance and mobility pt scored as high fall risk on Berg. pt would benefit from continued skilled PT services to further address her L knee, LE strength and balance to reduce risk of falls.    Rehab Potential Good   Clinical Impairments Affecting Rehab Potential hx of depression, hx of R knee OA, HTN   PT Frequency 2x / week   PT Duration 6 weeks   PT Treatment/Interventions Aquatic Therapy;Cryotherapy;Electrical Stimulation;Moist Heat;Ultrasound;Neuromuscular re-education;Balance training;Therapeutic exercise;Therapeutic activities;Functional mobility training;Stair training;Gait training;Dry needling;Taping   PT Next Visit Plan Outcome meausres, update goals, recert?, progress stregnth as tolerated    PT Home Exercise Plan As prescribed   Consulted and Agree with Plan of Care Patient      Patient will benefit from skilled therapeutic intervention in order to improve the following deficits and impairments:  Abnormal gait, Decreased activity tolerance, Decreased balance, Decreased strength, Difficulty walking, Hypomobility, Pain, Impaired flexibility, Increased fascial restricitons  Visit Diagnosis: Abnormality of gait - Plan: PT plan of care cert/re-cert  Left knee pain - Plan: PT plan of care cert/re-cert       G-Codes - 04-12-2016 1142    Functional Assessment Tool Used 5xsittostand, 10 m walk, berg   Functional Limitation Mobility: Walking and moving around   Mobility: Walking and Moving Around Current Status [phone]) At least 20 percent but less than 40 percent impaired, limited or restricted   Mobility: Walking and Moving Around Goal Status [phone]) At least 1 percent but less than 20 percent impaired, limited or restricted      Problem List Patient Active Problem List   Diagnosis Date Noted  . Tension headache 02/18/2016  . Degenerative arthritis of left knee 12/24/2015  . Left facial pain 10/29/2015  . Memory loss 10/29/2015  . Osteoarthritis Knees,  bilateral, moderate 06/16/2015  . Smoker 05/10/2015  . Solitary pulmonary nodule 03/28/2013  . Cardiac murmur 03/15/2012  . Hypertension   . UNSPECIFIED VITAMIN D DEFICIENCY 05/29/2009  . Pure hypercholesterolemia 12/07/2007  . Major depressive disorder, recurrent episode, in partial remission ([place]) 12/07/2007  . Diabetes mellitus type 2, controlled ([place]) 08/10/2007   Caryl Pina C. Cristella Stiver, PT, DPT [phone]  Shonteria Abeln 04/12/16, 12:05 PM  [place] MAIN [facility] SERVICES [address] [place], [place], [address] Phone: [phone]   Fax:  [phone]  Name: Amanda [name] MRN: [id number] Date of Birth: December 14, 1950

## 2016-03-17 ENCOUNTER — Other Ambulatory Visit: Payer: Self-pay

## 2016-03-17 MED ORDER — ATORVASTATIN CALCIUM 40 MG PO TABS: 40.0000 mg | ORAL_TABLET | Freq: Every day | ORAL | Status: DC

## 2016-03-17 NOTE — Telephone Encounter (Signed)
Pt called back and pt will ck pts husband's schedule and then cb to schedule annual exam and labs. Refill done and pt voiced understanding.

## 2016-03-17 NOTE — Telephone Encounter (Signed)
Pt left v/m requesting refill atorvastatin to [facility] [address]. Pt request cb. Pt last seen for f/u 01/23/16 and last annual exam with lipid labs 06/27/14. Left v/m requesting pt to cb.

## 2016-03-18 ENCOUNTER — Ambulatory Visit: Payer: Medicare Other

## 2016-03-18 DIAGNOSIS — R2681 Unsteadiness on feet: Secondary | ICD-10-CM | POA: Diagnosis not present

## 2016-03-18 DIAGNOSIS — R269 Unspecified abnormalities of gait and mobility: Secondary | ICD-10-CM | POA: Diagnosis not present

## 2016-03-18 DIAGNOSIS — M25562 Pain in left knee: Secondary | ICD-10-CM

## 2016-03-18 NOTE — Therapy (Signed)
[facility] RE[facility] SERVICES [address] [place], [place], [address] Phone: [phone]   Fax:  [phone]  Physical Therapy Treatment  Patient Details  Name: Amanda [name] MRN: [id number] Date of Birth: 08/09/1951 Referring Provider: Dr. Dean  Encounter Date: 03/18/2016      PT End of Session - 03/18/16 1259    Visit Number 9   Number of Visits 13   Date for PT Re-Evaluation 04/27/16   Authorization Type 1/10   Equipment Utilized During Treatment Gait belt   Activity Tolerance Patient tolerated treatment well   Behavior During Therapy WFL for tasks assessed/performed      Past Medical History  Diagnosis Date  . Depression   . Anxiety   . Hyperlipidemia   . GERD (gastroesophageal reflux disease)   . Tobacco abuse   . Smoker 05/10/2015  . Degenerative arthritis of knee, bilateral 06/16/2015  . Pneumonia     hx of this several times  . Heart murmur     slight per pt- benign   . Hypertension     hx of tachycardia   . History of tachycardia   . Thyroid disease     hyperthyroidism as a teenager - received some injections age 12 that corrected this   . Constipation     uses docusate and fiber  . Diabetes ([facility])     diet managed-type 11  . Tachycardia   . Shortness of breath dyspnea     with exertion  . Headache   . 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 Tonsillectomy      4 yoa  . Ovarian cyst removal  1972  . Abdominal hysterectomy  1993  . Wrist fracture surgery  11/2008  . Esophageal dilation      x 3  . Colonoscopy      30 years ago was normal per pt.   . Fractured leg Right   . Salivary gland surgery    . Cholecystectomy    . Back surgery    . Total knee arthroplasty Left 12/24/2015    Procedure: LEFT TOTAL KNEE ARTHROPLASTY;  Surgeon: Scott Gregory Dean, MD;  [facility];  Service: Orthopedics;  Laterality: Left;     There were no vitals filed for this visit.      Subjective Assessment - 03/18/16 1259    Subjective pt reports she is feeling pretty good today.    Pertinent History hx of R knee OA. hx of depression    Diagnostic tests Xray   Patient Stated Goals pt would like to improve balance/mobility and stairs.    Currently in Pain? Yes   Pain Score 3    Pain Location Knee   Pain Orientation Right;Left   Pain Type Surgical pain;Chronic pain       Therex: Nustep L 3 x 3 min no charge Leg press LLE 75lbs 2x10 Standing resisted hip flexion, abduction ,adduction and extension with   yellow     Band x 15 each way cues to keep SLS, light UE support Side stepping with yellow band in // bars no UE x 5 laps Fwd step up x 15 LLE, x 5 RLE with single UE support L side lunge x 10 Side stepping / weight shift / step back together no UE x 10 each leg Fwd step / weight shift then back x 10 each   leg NBOS arms crossed EC with PT induced perturbations x 2 min NBOS EC with head turns x 2 min Pt requires min-mod verbal and tactile cues for proper exercise performance    pt requires CGA for safety on balance exercises                             PT Education - 03/18/16 1259    Education provided Yes   Education Details promoting more weight shifting to the L   Person(s) Educated Patient   Methods Explanation   Comprehension Verbalized understanding             PT Long Term Goals - 03/18/16 1301    PT LONG TERM GOAL #1   Title pt will improve L knee ROM to at least 0-125 deg to be able to descend 3 stairs in step over step pattern using unilateral hand rail    Baseline 0-115 deg.    Time 6   Period Weeks   Status Partially Met   PT LONG TERM GOAL #2   Title pt will improve gait speed to at least 0.54ms for normalized home mobility using LRAD   Time 6   Period Weeks   Status Partially Met   PT LONG TERM GOAL #3   Title pt will be independent and compliant to  advanced HEP for conintued home strengthening    Time 6   Period Weeks   Status Achieved   PT LONG TERM GOAL #4   Title pt will decerase LEFS to less than 25% disability to maximize function    Status Partially Met   PT LONG TERM GOAL #5   Title pt will improve berg balance score by 6pts to reduce fall risk    Time 6   Period Weeks   Status New   PT LONG TERM GOAL #6   Title pt will perform 5x sit to stand in <25s demonstrating improved LE strength.    Time 6   Period Weeks   Status New               Plan - 03/18/16 1300    Clinical Impression Statement pt progressing well through strength and now incorporating more balance exercises to reduce fall risk. pts static balance is good, but has a lot more difficulty with dynamic balance especially to the R   Rehab Potential Good   Clinical Impairments Affecting Rehab Potential hx of depression, hx of R knee OA, HTN   PT Frequency 2x / week   PT Duration 6 weeks   PT Treatment/Interventions Aquatic Therapy;Cryotherapy;Electrical Stimulation;Moist Heat;Ultrasound;Neuromuscular re-education;Balance training;Therapeutic exercise;Therapeutic activities;Functional mobility training;Stair training;Gait training;Dry needling;Taping   PT Next Visit Plan Outcome meausres, update goals, recert?, progress stregnth as tolerated    PT Home Exercise Plan As prescribed   Consulted and Agree with Plan of Care Patient      Patient will benefit from skilled therapeutic intervention in order to improve the following deficits and impairments:  Abnormal gait, Decreased activity tolerance, Decreased balance, Decreased strength, Difficulty walking, Hypomobility, Pain, Impaired flexibility, Increased fascial restricitons  Visit Diagnosis: Pain in left knee - Plan: PT plan of care cert/re-cert  Unsteadiness on feet - Plan: PT plan of care cert/re-cert     Problem List Patient Active Problem List   Diagnosis Date Noted  . Tension headache  02/18/2016  . Degenerative arthritis of left knee 12/24/2015  . Left facial pain 10/29/2015  .  Memory loss 10/29/2015  . Osteoarthritis Knees, bilateral, moderate 06/16/2015  . Smoker 05/10/2015  . Solitary pulmonary nodule 03/28/2013  . Cardiac murmur 03/15/2012  . Hypertension   . UNSPECIFIED VITAMIN D DEFICIENCY 05/29/2009  . Pure hypercholesterolemia 12/07/2007  . Major depressive disorder, recurrent episode, in partial remission ([facility]) 12/07/2007  . Diabetes mellitus type 2, controlled ([facility]) 08/10/2007    C. , PT, DPT #[id number]  , 03/18/2016, 1:03 PM  [facility] MA[facility] REHAB SERVICES [address] [place], , [address] Phone: [phone]   Fax:  [phone]  Name: Amanda [name] MRN: [id number] Date of Birth: 08/04/1951     

## 2016-03-19 ENCOUNTER — Telehealth: Payer: Self-pay | Admitting: Neurology

## 2016-03-19 NOTE — Telephone Encounter (Signed)
Amanda [name] 2051/06/09 she was returning your call. Please call at [phone]. Thank you

## 2016-03-19 NOTE — Telephone Encounter (Signed)
Called patient and left msg on vm that I wasn't the one that called her. Advised her to call me back if she had anymore questions.

## 2016-03-25 ENCOUNTER — Ambulatory Visit: Payer: Medicare Other

## 2016-03-25 DIAGNOSIS — R2681 Unsteadiness on feet: Secondary | ICD-10-CM | POA: Diagnosis not present

## 2016-03-25 DIAGNOSIS — R269 Unspecified abnormalities of gait and mobility: Secondary | ICD-10-CM | POA: Diagnosis not present

## 2016-03-25 DIAGNOSIS — Z96652 Presence of left artificial knee joint: Secondary | ICD-10-CM | POA: Diagnosis not present

## 2016-03-25 DIAGNOSIS — M25562 Pain in left knee: Secondary | ICD-10-CM | POA: Diagnosis not present

## 2016-03-25 DIAGNOSIS — M5442 Lumbago with sciatica, left side: Secondary | ICD-10-CM | POA: Diagnosis not present

## 2016-03-25 DIAGNOSIS — M25461 Effusion, right knee: Secondary | ICD-10-CM | POA: Diagnosis not present

## 2016-03-25 DIAGNOSIS — M25561 Pain in right knee: Secondary | ICD-10-CM | POA: Diagnosis not present

## 2016-03-25 NOTE — Therapy (Signed)
[place] MAIN [facility] SERVICES [address] [place], [place], [address] Phone: [phone]   Fax:  [phone]  Physical Therapy Treatment  Patient Details  Name: Amanda [name] MRN: [id number] Date of Birth: 10/03/1951 Referring Provider: Dr. Marlou Sa  Encounter Date: 03/25/2016      PT End of Session - 03/25/16 1726    Visit Number 10   Number of Visits 13   Date for PT Re-Evaluation 04/27/16   Authorization Type 2/10   PT Start Time 1645   PT Stop Time 1730   PT Time Calculation (min) 45 min   Equipment Utilized During Treatment Gait belt   Activity Tolerance Patient tolerated treatment well   Behavior During Therapy [facility] for tasks assessed/performed      Past Medical History  Diagnosis Date  . Depression   . Anxiety   . Hyperlipidemia   . GERD (gastroesophageal reflux disease)   . Tobacco abuse   . Smoker 05/10/2015  . Degenerative arthritis of knee, bilateral 06/16/2015  . Pneumonia     hx of this several times  . Heart murmur     slight per pt- benign   . Hypertension     hx of tachycardia   . History of tachycardia   . Thyroid disease     hyperthyroidism as a teenager - received some injections age 68 that corrected this   . Constipation     uses docusate and fiber  . Diabetes ([place])     diet managed-type 11  . Tachycardia   . Shortness of breath dyspnea     with exertion  . Headache   . 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 Tonsillectomy      4 yoa  . Ovarian cyst removal  1972  . Abdominal hysterectomy  1993  . Wrist fracture surgery  11/2008  . Esophageal dilation      x 3  . Colonoscopy      30 years ago was normal per pt.   . Fractured leg Right   . Salivary gland surgery    . Cholecystectomy    . Back surgery    . Total knee arthroplasty Left 12/24/2015    Procedure: LEFT TOTAL KNEE ARTHROPLASTY;  Surgeon: [name], MD;  Location: [place];  Service: Orthopedics;  Laterality: Left;    There were no vitals filed for this visit.      Subjective Assessment - 03/25/16 1650    Subjective pt reports she had f/u with surgeon who is very pleased with her progress. she is having MRI on her back due to sciatic pain.    Pertinent History hx of R knee OA. hx of depression    Diagnostic tests Xray   Patient Stated Goals pt would like to improve balance/mobility and stairs.    Currently in Pain? Yes   Pain Score 3    Pain Location Back       therex:   Nustep L 4 x 5 min on charge Leg press LLE only 75lbs 2x12 Supine leg press with blue band x 10 Fwd BOSU lunge LLE x12 Side BOSU lunge x 12 Toe taps on AIREX and box 2x20 Fwd step up onto box and back onto AIREX x 15 LLE only Standing on AIREX EO/EC 30s x 10 Standing on AIREX EC with trunk turns x 10  Pt requires min verbal and tactile cues for proper exercise performance   pt requires CGA for safety on balance exercises                             PT Long Term Goals - 03/18/16 1301    PT LONG TERM GOAL #1   Title pt will improve L knee ROM to at least 0-125 deg to be able to descend 3 stairs in step over step pattern using unilateral hand rail    Baseline 0-115 deg.    Time 6   Period Weeks   Status Partially Met   PT LONG TERM GOAL #2   Title pt will improve gait speed to at least 0.47ms for normalized home mobility using LRAD   Time 6   Period Weeks   Status Partially Met   PT LONG TERM GOAL #3   Title pt will be independent and compliant to advanced HEP for conintued home strengthening    Time 6   Period Weeks   Status Achieved   PT LONG TERM GOAL #4   Title pt will decerase LEFS to less than 25% disability to maximize function    Status Partially Met   PT LONG TERM GOAL #5   Title pt will improve berg balance score by 6pts to reduce fall risk    Time 6   Period Weeks   Status New   PT LONG TERM  GOAL #6   Title pt will perform 5x sit to stand in <25s demonstrating improved LE strength.    Time 6   Period Weeks   Status New               Plan - 03/25/16 1726    Clinical Impression Statement pt progressing well through therapy and balance exercises. she is less anxious about exercise also.    Rehab Potential Good   Clinical Impairments Affecting Rehab Potential hx of depression, hx of R knee OA, HTN   PT Frequency 2x / week   PT Duration 6 weeks   PT Treatment/Interventions Aquatic Therapy;Cryotherapy;Electrical Stimulation;Moist Heat;Ultrasound;Neuromuscular re-education;Balance training;Therapeutic exercise;Therapeutic activities;Functional mobility training;Stair training;Gait training;Dry needling;Taping   PT Next Visit Plan Outcome meausres, update goals, recert?, progress stregnth as tolerated    PT Home Exercise Plan As prescribed   Consulted and Agree with Plan of Care Patient      Patient will benefit from skilled therapeutic intervention in order to improve the following deficits and impairments:  Abnormal gait, Decreased activity tolerance, Decreased balance, Decreased strength, Difficulty walking, Hypomobility, Pain, Impaired flexibility, Increased fascial restricitons  Visit Diagnosis: Pain in left knee     Problem List Patient Active Problem List   Diagnosis Date Noted  . Tension headache 02/18/2016  . Degenerative arthritis of left knee 12/24/2015  . Left facial pain 10/29/2015  . Memory loss 10/29/2015  . Osteoarthritis Knees, bilateral, moderate 06/16/2015  . Smoker 05/10/2015  . Solitary pulmonary nodule 03/28/2013  . Cardiac murmur 03/15/2012  . Hypertension   . UNSPECIFIED VITAMIN D DEFICIENCY 05/29/2009  . Pure hypercholesterolemia 12/07/2007  . Major depressive disorder, recurrent episode, in partial remission ([place] 12/07/2007  . Diabetes mellitus type 2, controlled ([place] 08/10/2007    Amanda [name] 03/25/2016, 5:27 PM  [facility] [facility]SERV[facility] [address] [place] [address]Phone: [phone]  Fax:  [phone] Name: Amanda HELDMRN: [id number] of Birth:  09/28/1951     

## 2016-04-02 ENCOUNTER — Ambulatory Visit: Payer: Medicare Other

## 2016-04-02 DIAGNOSIS — R269 Unspecified abnormalities of gait and mobility: Secondary | ICD-10-CM | POA: Diagnosis not present

## 2016-04-02 DIAGNOSIS — R2681 Unsteadiness on feet: Secondary | ICD-10-CM

## 2016-04-02 DIAGNOSIS — M25562 Pain in left knee: Secondary | ICD-10-CM

## 2016-04-02 NOTE — Therapy (Signed)
[place] MAIN [facility] SERVICES [address] [place], [place], [address] Phone: [phone]   Fax:  [phone]  Physical Therapy Treatment  Patient Details  Name: Amanda [name] MRN: [id number] Date of Birth: 11-07-1951 Referring Provider: Dr. Marlou Sa  Encounter Date: 04/02/2016      PT End of Session - 04/02/16 1659    Visit Number 11   Number of Visits 13   Date for PT Re-Evaluation 04/27/16   Authorization Type 3/10   PT Start Time 1600   PT Stop Time 1645   PT Time Calculation (min) 45 min   Equipment Utilized During Treatment Gait belt   Activity Tolerance Patient tolerated treatment well   Behavior During Therapy [facility] for tasks assessed/performed      Past Medical History  Diagnosis Date  . Depression   . Anxiety   . Hyperlipidemia   . GERD (gastroesophageal reflux disease)   . Tobacco abuse   . Smoker 05/10/2015  . Degenerative arthritis of knee, bilateral 06/16/2015  . Pneumonia     hx of this several times  . Heart murmur     slight per pt- benign   . Hypertension     hx of tachycardia   . History of tachycardia   . Thyroid disease     hyperthyroidism as a teenager - received some injections age 82 that corrected this   . Constipation     uses docusate and fiber  . Diabetes ([place])     diet managed-type 11  . Tachycardia   . Shortness of breath dyspnea     with exertion  . Headache   . 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 Tonsillectomy      4 yoa  . Ovarian cyst removal  1972  . Abdominal hysterectomy  1993  . Wrist fracture surgery  11/2008  . Esophageal dilation      x 3  . Colonoscopy      30 years ago was normal per pt.   . Fractured leg Right   . Salivary gland surgery    . Cholecystectomy    . Back surgery    . Total knee arthroplasty Left 12/24/2015    Procedure: LEFT TOTAL KNEE ARTHROPLASTY;  Surgeon: [name], MD;  Location: [place];  Service: Orthopedics;  Laterality: Left;    There were no vitals filed for this visit.      Subjective Assessment - 04/02/16 1613    Subjective pt reports she has been doing well. she feels like her balance is still not as it should be though    Pertinent History hx of R knee OA. hx of depression    Diagnostic tests Xray   Patient Stated Goals pt would like to improve balance/mobility and stairs.    Currently in Pain? No/denies      NMR: Fwd/side BOSU lunge LLE only x 10 each CGA for safety Large rocker board SS weight shift x 2 min on UE Static balance on large rocker board with EO/eC 10s x10 In corner (for home safety) semi tandem EO/EC 30s x 5 In corner semi tandem- eyes open with vert/ horiz head turns 3x10- Standing on AIREX VOR 1 exercise vert/ horiz 30s x 2 each. Pt reports blurred vision with VOR exercises  Pt requires min verbal and tactile cues for proper exercise performance  PT Education - 04/02/16 1657    Education provided Yes   Education Details balance systems and progression    Person(s) Educated Patient   Methods Explanation   Comprehension Verbalized understanding             PT Long Term Goals - 03/18/16 1301    PT LONG TERM GOAL #1   Title pt will improve L knee ROM to at least 0-125 deg to be able to descend 3 stairs in step over step pattern using unilateral hand rail    Baseline 0-115 deg.    Time 6   Period Weeks   Status Partially Met   PT LONG TERM GOAL #2   Title pt will improve gait speed to at least 0.11ms for normalized home mobility using LRAD   Time 6   Period Weeks   Status Partially Met   PT LONG TERM GOAL #3   Title pt will be independent and compliant to advanced HEP for conintued home strengthening    Time 6   Period Weeks   Status Achieved   PT LONG TERM GOAL #4   Title pt will decerase LEFS to less than 25% disability to maximize  function    Status Partially Met   PT LONG TERM GOAL #5   Title pt will improve berg balance score by 6pts to reduce fall risk    Time 6   Period Weeks   Status New   PT LONG TERM GOAL #6   Title pt will perform 5x sit to stand in <25s demonstrating improved LE strength.    Time 6   Period Weeks   Status New               Plan - 04/02/16 1701    Clinical Impression Statement pt did well with progression of HEP to include balance exercises. pt demosntartes safe knoweldge of how to do this at home.    Rehab Potential Good   Clinical Impairments Affecting Rehab Potential hx of depression, hx of R knee OA, HTN   PT Frequency 2x / week   PT Duration 6 weeks   PT Treatment/Interventions Aquatic Therapy;Cryotherapy;Electrical Stimulation;Moist Heat;Ultrasound;Neuromuscular re-education;Balance training;Therapeutic exercise;Therapeutic activities;Functional mobility training;Stair training;Gait training;Dry needling;Taping   PT Next Visit Plan Outcome meausres, update goals, recert?, progress stregnth as tolerated    PT Home Exercise Plan As prescribed   Consulted and Agree with Plan of Care Patient      Patient will benefit from skilled therapeutic intervention in order to improve the following deficits and impairments:  Abnormal gait, Decreased activity tolerance, Decreased balance, Decreased strength, Difficulty walking, Hypomobility, Pain, Impaired flexibility, Increased fascial restricitons  Visit Diagnosis: Pain in left knee  Unsteadiness on feet     Problem List Patient Active Problem List   Diagnosis Date Noted  . Tension headache 02/18/2016  . Degenerative arthritis of left knee 12/24/2015  . Left facial pain 10/29/2015  . Memory loss 10/29/2015  . Osteoarthritis Knees, bilateral, moderate 06/16/2015  . Smoker 05/10/2015  . Solitary pulmonary nodule 03/28/2013  . Cardiac murmur 03/15/2012  . Hypertension   . UNSPECIFIED VITAMIN D DEFICIENCY 05/29/2009  .  Pure hypercholesterolemia 12/07/2007  . Major depressive disorder, recurrent episode, in partial remission ([place] 12/07/2007  . Diabetes mellitus type 2, controlled ([place] 08/10/2007   ACaryl PinaC. Zriyah Kopplin, PT, DPT #[phone] [place] Scheidegger 04/02/2016, 5:03 PM  [place] [facility]MAIN RPalm [facility]patient Surgical CenterSERVICES [address] [place] [address]Phone: [phone]  Fax:  [phone]  Name: Amanda [name] MRN: [id number] Date of Birth: 1950-12-25

## 2016-04-03 ENCOUNTER — Other Ambulatory Visit: Payer: Self-pay | Admitting: Orthopedic Surgery

## 2016-04-03 DIAGNOSIS — M545 Low back pain: Secondary | ICD-10-CM

## 2016-04-07 ENCOUNTER — Ambulatory Visit: Payer: Medicare Other | Attending: Orthopedic Surgery

## 2016-04-07 DIAGNOSIS — R2681 Unsteadiness on feet: Secondary | ICD-10-CM | POA: Insufficient documentation

## 2016-04-07 DIAGNOSIS — M25562 Pain in left knee: Secondary | ICD-10-CM | POA: Diagnosis not present

## 2016-04-07 NOTE — Therapy (Signed)
[place] MAIN [facility] SERVICES [address] [place], [place], [address] Phone: [phone]   Fax:  [phone]  Physical Therapy Treatment  Patient Details  Name: JAIDEN WAHAB MRN: [id number] Date of Birth: 10-19-1951 Referring Provider: Dr. Marlou Sa  Encounter Date: 04/07/2016      PT End of Session - 04/07/16 1653    Visit Number 12   Number of Visits 13   Date for PT Re-Evaluation 04/27/16   Authorization Type 4/10   PT Start Time 1605   PT Stop Time 1647   PT Time Calculation (min) 42 min   Equipment Utilized During Treatment Gait belt   Activity Tolerance Patient tolerated treatment well   Behavior During Therapy [facility] for tasks assessed/performed      Past Medical History  Diagnosis Date  . Depression   . Anxiety   . Hyperlipidemia   . GERD (gastroesophageal reflux disease)   . Tobacco abuse   . Smoker 05/10/2015  . Degenerative arthritis of knee, bilateral 06/16/2015  . Pneumonia     hx of this several times  . Heart murmur     slight per pt- benign   . Hypertension     hx of tachycardia   . History of tachycardia   . Thyroid disease     hyperthyroidism as a teenager - received some injections age 21 that corrected this   . Constipation     uses docusate and fiber  . Diabetes ([place])     diet managed-type 11  . Tachycardia   . Shortness of breath dyspnea     with exertion  . Headache   . 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 Tonsillectomy      4 yoa  . Ovarian cyst removal  1972  . Abdominal hysterectomy  1993  . Wrist fracture surgery  11/2008  . Esophageal dilation      x 3  . Colonoscopy      30 years ago was normal per pt.   . Fractured leg Right   . Salivary gland surgery    . Cholecystectomy    . Back surgery    . Total knee arthroplasty Left 12/24/2015    Procedure: LEFT TOTAL KNEE ARTHROPLASTY;  Surgeon: [name], MD;  Location: [place];  Service: Orthopedics;  Laterality: Left;    There were no vitals filed for this visit.      Subjective Assessment - 04/07/16 1622    Subjective pt reports she is having back MRI next week. she reports her L knee is sore but she has been doing more chores at home    Pertinent History hx of R knee OA. hx of depression    Diagnostic tests Xray   Patient Stated Goals pt would like to improve balance/mobility and stairs.    Pain Score 4    Pain Location Knee   Pain Orientation Left   Pain Descriptors / Indicators Aching      Therex:  L LE leg press 75 2x10 Eccentric front step downs LLE 2x10 4in TKE with ball on wall LLE x 15 Side step overs x 10 total BLE 4in aerobic step Pt requires min verbal and tactile cues for proper exercise performance   NMR Heel toe walking in // bars fwd/retro x 3 laps VOR 1 30s x 3 vert/horiz standing on AIREX Fwd retro  monster walk red band 60f x 2 laps with CGA    pt requires CGA for safety on balance exercises                               PT Long Term Goals - 03/18/16 1301    PT LONG TERM GOAL #1   Title pt will improve L knee ROM to at least 0-125 deg to be able to descend 3 stairs in step over step pattern using unilateral hand rail    Baseline 0-115 deg.    Time 6   Period Weeks   Status Partially Met   PT LONG TERM GOAL #2   Title pt will improve gait speed to at least 0.863m for normalized home mobility using LRAD   Time 6   Period Weeks   Status Partially Met   PT LONG TERM GOAL #3   Title pt will be independent and compliant to advanced HEP for conintued home strengthening    Time 6   Period Weeks   Status Achieved   PT LONG TERM GOAL #4   Title pt will decerase LEFS to less than 25% disability to maximize function    Status Partially Met   PT LONG TERM GOAL #5   Title pt will improve berg balance score by 6pts to reduce fall risk    Time 6   Period Weeks   Status  New   PT LONG TERM GOAL #6   Title pt will perform 5x sit to stand in <25s demonstrating improved LE strength.    Time 6   Period Weeks   Status New               Plan - 04/07/16 1653    Clinical Impression Statement pt struggles some w/ R knee pain / instability with progression of balance exercises today. she had difficutly with dynamic balance/vestibular exercises, but has good modivation and is progressing well. pt did not need rest breaks today   Rehab Potential Good   Clinical Impairments Affecting Rehab Potential hx of depression, hx of R knee OA, HTN   PT Frequency 2x / week   PT Duration 6 weeks   PT Treatment/Interventions Aquatic Therapy;Cryotherapy;Electrical Stimulation;Moist Heat;Ultrasound;Neuromuscular re-education;Balance training;Therapeutic exercise;Therapeutic activities;Functional mobility training;Stair training;Gait training;Dry needling;Taping   PT Next Visit Plan Outcome meausres, update goals, recert?, progress stregnth as tolerated    PT Home Exercise Plan As prescribed   Consulted and Agree with Plan of Care Patient      Patient will benefit from skilled therapeutic intervention in order to improve the following deficits and impairments:  Abnormal gait, Decreased activity tolerance, Decreased balance, Decreased strength, Difficulty walking, Hypomobility, Pain, Impaired flexibility, Increased fascial restricitons  Visit Diagnosis: Pain in left knee  Unsteadiness on feet     Problem List Patient Active Problem List   Diagnosis Date Noted  . Tension headache 02/18/2016  . Degenerative arthritis of left knee 12/24/2015  . Left facial pain 10/29/2015  . Memory loss 10/29/2015  . Osteoarthritis Knees, bilateral, moderate 06/16/2015  . Smoker 05/10/2015  . Solitary pulmonary nodule 03/28/2013  . Cardiac murmur 03/15/2012  . Hypertension   . UNSPECIFIED VITAMIN D DEFICIENCY 05/29/2009  . Pure hypercholesterolemia 12/07/2007  . Major depressive  disorder, recurrent episode, in partial remission ([place]12/31/2008  . Diabetes mellitus type 2, controlled ([place]09/02/2007    Aza Dantes 04/07/2016, 4:55 PM  [place]  MAIN [facility] SERVICES [place], [place], [address] Phone: [phone]   Fax:  [phone]  Name: LYLIE BLACKLOCK MRN: [id number] Date of Birth: 1951/01/17

## 2016-04-08 ENCOUNTER — Ambulatory Visit
Admission: RE | Admit: 2016-04-08 | Discharge: 2016-04-08 | Disposition: A | Discharge status: Home / Self Care | Payer: Medicare Other | Source: Ambulatory Visit | Attending: Orthopedic Surgery | Admitting: Orthopedic Surgery

## 2016-04-08 DIAGNOSIS — M4806 Spinal stenosis, lumbar region: Secondary | ICD-10-CM | POA: Diagnosis not present

## 2016-04-08 DIAGNOSIS — M545 Low back pain: Secondary | ICD-10-CM

## 2016-04-09 ENCOUNTER — Ambulatory Visit: Payer: Medicare Other

## 2016-04-09 DIAGNOSIS — R2681 Unsteadiness on feet: Secondary | ICD-10-CM

## 2016-04-09 DIAGNOSIS — M25562 Pain in left knee: Secondary | ICD-10-CM

## 2016-04-09 NOTE — Therapy (Signed)
[place] MAIN [facility] SERVICES [address] [place], [place], [address] Phone: [phone]   Fax:  [phone]  Physical Therapy Treatment  Patient Details  Name: Amanda [name] MRN: [id number] Date of Birth: Apr 19, 1951 Referring Provider: Dr. Marlou Sa  Encounter Date: 04/09/2016      PT End of Session - 04/09/16 1654    Visit Number 13   Number of Visits 25   Date for PT Re-Evaluation 04/27/16   Authorization Type 5/10   PT Start Time 1600   PT Stop Time 1645   PT Time Calculation (min) 45 min   Equipment Utilized During Treatment Gait belt   Activity Tolerance Patient tolerated treatment well   Behavior During Therapy [facility] for tasks assessed/performed      Past Medical History  Diagnosis Date  . Depression   . Anxiety   . Hyperlipidemia   . GERD (gastroesophageal reflux disease)   . Tobacco abuse   . Smoker 05/10/2015  . Degenerative arthritis of knee, bilateral 06/16/2015  . Pneumonia     hx of this several times  . Heart murmur     slight per pt- benign   . Hypertension     hx of tachycardia   . History of tachycardia   . Thyroid disease     hyperthyroidism as a teenager - received some injections age 66 that corrected this   . Constipation     uses docusate and fiber  . Diabetes ([place])     diet managed-type 11  . Tachycardia   . Shortness of breath dyspnea     with exertion  . Headache   . 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 Tonsillectomy      4 yoa  . Ovarian cyst removal  1972  . Abdominal hysterectomy  1993  . Wrist fracture surgery  11/2008  . Esophageal dilation      x 3  . Colonoscopy      30 years ago was normal per pt.   . Fractured leg Right   . Salivary gland surgery    . Cholecystectomy    . Back surgery    . Total knee arthroplasty Left 12/24/2015    Procedure: LEFT TOTAL KNEE ARTHROPLASTY;  Surgeon: [name], MD;  Location: [place];  Service: Orthopedics;  Laterality: Left;    There were no vitals filed for this visit.      Subjective Assessment - 04/09/16 1652    Subjective pt reports her knees are both hurting today    Pertinent History hx of R knee OA. hx of depression    Diagnostic tests Xray   Patient Stated Goals pt would like to improve balance/mobility and stairs.    Currently in Pain? Yes   Pain Score 5    Pain Location Knee   Pain Orientation Left;Right   Pain Descriptors / Indicators Aching   Pain Type Chronic pain     NMR: [place] (front only) no Ue x 5 laps in // bars Toe taps on AIREX onto box 2x10  Standing 1 foot on AIREX 1 foot on box with trunk turns holding med ball (reD) 3x10 each side Standing EO/EC on AIREX 1 minx 2 Standing EC with vert/horiz head turns 2x10 each on AIREX  EC march on AIREX x 3 min EC march on floor x 3 min   pt  requires CGA for safety on balance exercises                                   PT Long Term Goals - 03/18/16 1301    PT LONG TERM GOAL #1   Title pt will improve L knee ROM to at least 0-125 deg to be able to descend 3 stairs in step over step pattern using unilateral hand rail    Baseline 0-115 deg.    Time 6   Period Weeks   Status Partially Met   PT LONG TERM GOAL #2   Title pt will improve gait speed to at least 0.51ms for normalized home mobility using LRAD   Time 6   Period Weeks   Status Partially Met   PT LONG TERM GOAL #3   Title pt will be independent and compliant to advanced HEP for conintued home strengthening    Time 6   Period Weeks   Status Achieved   PT LONG TERM GOAL #4   Title pt will decerase LEFS to less than 25% disability to maximize function    Status Partially Met   PT LONG TERM GOAL #5   Title pt will improve berg balance score by 6pts to reduce fall risk    Time 6   Period Weeks   Status New   PT LONG TERM GOAL #6   Title pt will perform 5x sit to  stand in <25s demonstrating improved LE strength.    Time 6   Period Weeks   Status New               Plan - 04/09/16 1654    Clinical Impression Statement pt was challenged with progression of dynamic balance activities. modified treatment today to ease pressure on the knees due to her pain.    Rehab Potential Good   Clinical Impairments Affecting Rehab Potential hx of depression, hx of R knee OA, HTN   PT Frequency 2x / week   PT Duration 6 weeks   PT Treatment/Interventions Aquatic Therapy;Cryotherapy;Electrical Stimulation;Moist Heat;Ultrasound;Neuromuscular re-education;Balance training;Therapeutic exercise;Therapeutic activities;Functional mobility training;Stair training;Gait training;Dry needling;Taping   PT Next Visit Plan Outcome meausres, update goals, recert?, progress stregnth as tolerated    PT Home Exercise Plan As prescribed   Consulted and Agree with Plan of Care Patient      Patient will benefit from skilled therapeutic intervention in order to improve the following deficits and impairments:  Abnormal gait, Decreased activity tolerance, Decreased balance, Decreased strength, Difficulty walking, Hypomobility, Pain, Impaired flexibility, Increased fascial restricitons  Visit Diagnosis: Pain in left knee  Unsteadiness on feet     Problem List Patient Active Problem List   Diagnosis Date Noted  . Tension headache 02/18/2016  . Degenerative arthritis of left knee 12/24/2015  . Left facial pain 10/29/2015  . Memory loss 10/29/2015  . Osteoarthritis Knees, bilateral, moderate 06/16/2015  . Smoker 05/10/2015  . Solitary pulmonary nodule 03/28/2013  . Cardiac murmur 03/15/2012  . Hypertension   . UNSPECIFIED VITAMIN D DEFICIENCY 05/29/2009  . Pure hypercholesterolemia 12/07/2007  . Major depressive disorder, recurrent episode, in partial remission ([place] 12/07/2007  . Diabetes mellitus type 2, controlled ([place] 08/10/2007   ACaryl PinaC. Edye Hainline, PT, DPT  #[phone] Darek Eifler 04/09/2016, 4:56 PM  [place]MAIN [facility]Ge[facility] [address]RMoshannon [place] [address]Phone: [phone]  Fax:  [phone]  Name: Amanda [name] MRN: [id number] Date of Birth: 1951-06-09

## 2016-04-14 ENCOUNTER — Ambulatory Visit: Payer: Medicare Other

## 2016-04-14 DIAGNOSIS — M25562 Pain in left knee: Secondary | ICD-10-CM

## 2016-04-14 DIAGNOSIS — R2681 Unsteadiness on feet: Secondary | ICD-10-CM

## 2016-04-14 NOTE — Therapy (Signed)
[place] MAIN [facility] SERVICES [address] [place], [place], [address] Phone: [phone]   Fax:  [phone]  Physical Therapy Treatment  Patient Details  Name: Amanda [name] MRN: [id number] Date of Birth: Sep 10, 1951 (65 years old) Referring Provider: Dr. Marlou Sa  Encounter Date: 04/14/2016      PT End of Session - 04/14/16 1649    Visit Number 14   Number of Visits 25   Date for PT Re-Evaluation 04/27/16   Authorization Type 6/10   PT Start Time 1600   PT Stop Time 1642   PT Time Calculation (min) 42 min   Equipment Utilized During Treatment Gait belt   Activity Tolerance Patient tolerated treatment well   Behavior During Therapy [facility] for tasks assessed/performed      Past Medical History  Diagnosis Date  . Depression   . Anxiety   . Hyperlipidemia   . GERD (gastroesophageal reflux disease)   . Tobacco abuse   . Smoker 05/10/2015  . Degenerative arthritis of knee, bilateral 06/16/2015  . Pneumonia     hx of this several times  . Heart murmur     slight per pt- benign   . Hypertension     hx of tachycardia   . History of tachycardia   . Thyroid disease     hyperthyroidism as a teenager - received some injections age 49 that corrected this   . Constipation     uses docusate and fiber  . Diabetes ([place])     diet managed-type 11  . Tachycardia   . Shortness of breath dyspnea     with exertion  . Headache   . Neuromuscular disorder ([place])     nerve damage in back and shoulder    Past Surgical History  Procedure Laterality Date  . Tonsillectomy      4 yoa  . Ovarian cyst removal  1972  . Abdominal hysterectomy  1993  . Wrist fracture surgery  11/2008  . Esophageal dilation      x 3  . Colonoscopy      30 years ago was normal per pt.   . Fractured leg Right   . Salivary gland surgery    . Cholecystectomy    . Back surgery    . Total knee arthroplasty Left 12/24/2015    Procedure: LEFT TOTAL KNEE ARTHROPLASTY;  Surgeon: [name], MD;  Location: [place];  Service: Orthopedics;  Laterality: Left;    There were no vitals filed for this visit.      Subjective Assessment - 04/14/16 1636    Subjective pt reports she is pretty sore in her back and legs after sitting in a lot chair all weekend visiting her mother.    Pertinent History hx of R knee OA. hx of depression    Diagnostic tests Xray   Patient Stated Goals pt would like to improve balance/mobility and stairs.    Pain Score 5    Pain Location Back   Pain Orientation Left     therex: LTR in supine x 2 min SKTC with glute stretch 10s x 10 each side Piriformis stretch 30s x 2 each leg Bridging with Diaphragmatic breathing (initiating TA contraction) 2x10  NMR Fwd/ side/ and retro stepping with weight shifting  x10 on each leg no UE  Pt demonstrates much improved weight transfer  On AIREX NBOS eyes closed over head ball lift 2x15 On AIREX eyes closed trunk turns with ball 2 x 10  pt  requires CGA for safety on balance exercises                                  PT Long Term Goals - 03/18/16 1301    PT LONG TERM GOAL #1   Title pt will improve L knee ROM to at least 0-125 deg to be able to descend 3 stairs in step over step pattern using unilateral hand rail    Baseline 0-115 deg.    Time 6   Period Weeks   Status Partially Met   PT LONG TERM GOAL #2   Title pt will improve gait speed to at least 0.50ms for normalized home mobility using LRAD   Time 6   Period Weeks   Status Partially Met   PT LONG TERM GOAL #3   Title pt will be independent and compliant to advanced HEP for conintued home strengthening    Time 6   Period Weeks   Status Achieved   PT LONG TERM GOAL #4   Title pt will decerase LEFS to less than 25% disability to maximize function    Status Partially Met   PT LONG TERM GOAL #5   Title pt will improve berg balance score by 6pts to reduce fall risk    Time 6   Period Weeks   Status New   PT  LONG TERM GOAL #6   Title pt will perform 5x sit to stand in <25s demonstrating improved LE strength.    Time 6   Period Weeks   Status New               Plan - 04/14/16 1649    Clinical Impression Statement PT modified treatment today to account for her increased pain. progressed balance exercise on compliant surface without issue. pt will be evaluated for DC next visit.    Rehab Potential Good   Clinical Impairments Affecting Rehab Potential hx of depression, hx of R knee OA, HTN   PT Frequency 2x / week   PT Duration 6 weeks   PT Treatment/Interventions Aquatic Therapy;Cryotherapy;Electrical Stimulation;Moist Heat;Ultrasound;Neuromuscular re-education;Balance training;Therapeutic exercise;Therapeutic activities;Functional mobility training;Stair training;Gait training;Dry needling;Taping   PT Next Visit Plan Outcome meausres, update goals, recert?, progress stregnth as tolerated    PT Home Exercise Plan As prescribed   Consulted and Agree with Plan of Care Patient      Patient will benefit from skilled therapeutic intervention in order to improve the following deficits and impairments:  Abnormal gait, Decreased activity tolerance, Decreased balance, Decreased strength, Difficulty walking, Hypomobility, Pain, Impaired flexibility, Increased fascial restricitons  Visit Diagnosis: Pain in left knee  Unsteadiness on feet     Problem List Patient Active Problem List   Diagnosis Date Noted  . Tension headache 02/18/2016  . Degenerative arthritis of left knee 12/24/2015  . Left facial pain 10/29/2015  . Memory loss 10/29/2015  . Osteoarthritis Knees, bilateral, moderate 06/16/2015  . Smoker 05/10/2015  . Solitary pulmonary nodule 03/28/2013  . Cardiac murmur 03/15/2012  . Hypertension   . UNSPECIFIED VITAMIN D DEFICIENCY 05/29/2009  . Pure hypercholesterolemia 12/07/2007  . Major depressive disorder, recurrent episode, in partial remission ([place] 12/07/2007  . Diabetes  mellitus type 2, controlled ([place] 08/10/2007   ACaryl PinaC. Tino Ronan, PT, DPT #[phone] Shemeka Wardle 04/14/2016, 4:50 PM  CS[place]MAIN RCleveland [facility] Surgery CenterSERVICES [address]RBristol [address] [address]Phone: [phone]  Fax:  [phone]  Name: AMERI CAHOON MRN: [id number] Date of Birth: 13-May-1951

## 2016-04-16 ENCOUNTER — Ambulatory Visit: Payer: Medicare Other

## 2016-04-16 DIAGNOSIS — M25562 Pain in left knee: Secondary | ICD-10-CM | POA: Diagnosis not present

## 2016-04-16 DIAGNOSIS — R2681 Unsteadiness on feet: Secondary | ICD-10-CM | POA: Diagnosis not present

## 2016-04-16 NOTE — Therapy (Signed)
[place] MAIN [facility] SERVICES [address] [place], [place], [address] Phone: [phone]   Fax:  [phone]  Physical Therapy Treatment  Patient Details  Name: Amanda [name] MRN: [id number] Date of Birth: March 18, 1951 Referring Provider: Dr. Marlou Sa  Encounter Date: 04/16/2016      PT End of Session - 04/16/16 1658    Visit Number 15   Number of Visits 25   Date for PT Re-Evaluation 04/27/16   Authorization Type 7/10   PT Start Time 1600   PT Stop Time 1645   PT Time Calculation (min) 45 min   Equipment Utilized During Treatment Gait belt   Activity Tolerance Patient tolerated treatment well   Behavior During Therapy [facility] for tasks assessed/performed      Past Medical History  Diagnosis Date  . Depression   . Anxiety   . Hyperlipidemia   . GERD (gastroesophageal reflux disease)   . Tobacco abuse   . Smoker 05/10/2015  . Degenerative arthritis of knee, bilateral 06/16/2015  . Pneumonia     hx of this several times  . Heart murmur     slight per pt- benign   . Hypertension     hx of tachycardia   . History of tachycardia   . Thyroid disease     hyperthyroidism as a teenager - received some injections age 20 that corrected this   . Constipation     uses docusate and fiber  . Diabetes ([place])     diet managed-type 11  . Tachycardia   . Shortness of breath dyspnea     with exertion  . Headache   . 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 Tonsillectomy      4 yoa  . Ovarian cyst removal  1972  . Abdominal hysterectomy  1993  . Wrist fracture surgery  11/2008  . Esophageal dilation      x 3  . Colonoscopy      30 years ago was normal per pt.   . Fractured leg Right   . Salivary gland surgery    . Cholecystectomy    . Back surgery    . Total knee arthroplasty Left 12/24/2015    Procedure: LEFT TOTAL KNEE ARTHROPLASTY;  Surgeon: [name], MD;  Location: [place];  Service: Orthopedics;  Laterality: Left;    There were no vitals filed for this visit.      Subjective Assessment - 04/16/16 1656    Subjective pt reports she feels likeher balance is pretty good.    Pertinent History hx of R knee OA. hx of depression    Diagnostic tests Xray   Patient Stated Goals pt would like to improve balance/mobility and stairs.    Currently in Pain? Yes   Pain Score 4    Pain Location Knee   Pain Orientation Right   Pain Descriptors / Indicators Aching       PT reassessed outcome measures and progress towards goals as follows:      [facility] PT Assessment - 04/16/16 0001    Standardized Balance Assessment   Five times sit to stand comments  17.5   10 Meter Walk 0.85   Berg Balance Test   Sit to Stand Able to stand without using hands and stabilize independently   Standing Unsupported Able to stand safely 2 minutes   Sitting with Back  Unsupported but Feet Supported on Floor or Stool Able to sit safely and securely 2 minutes   Stand to Sit Sits safely with minimal use of hands   Transfers Able to transfer safely, minor use of hands   Standing Unsupported with Eyes Closed Able to stand 10 seconds safely   Standing Ubsupported with Feet Together Able to place feet together independently and stand 1 minute safely   From Standing, Reach Forward with Outstretched Arm Can reach confidently >25 cm (10")   From Standing Position, Pick up Object from Floor Able to pick up shoe safely and easily   From Standing Position, Turn to Look Behind Over each Shoulder Looks behind from both sides and weight shifts well   Turn 360 Degrees Able to turn 360 degrees safely one side only in 4 seconds or less   Standing Unsupported, Alternately Place Feet on Step/Stool Able to stand independently and safely and complete 8 steps in 20 seconds   Standing Unsupported, One Foot in Front Able to plae foot ahead of the other independently and hold 30  seconds   Standing on One Leg Able to lift leg independently and hold 5-10 seconds   Total Score 53     TM : 1.56mh x 5  Min with double UE support. Cues to widen BOS Heel toe walk with light UE support x 2 laps in // bars Side stepping with head turn x 2 laps in // bars Tandem standing 30s x 3 each side  pt requires CGA for safety on balance exercises                          PT Education - 04/16/16 1658    Education provided Yes   Education Details progress towards goals, balance progression    Person(s) Educated Patient   Methods Explanation   Comprehension Verbalized understanding             PT Long Term Goals - 04/16/16 1700    PT LONG TERM GOAL #1   Title pt will improve L knee ROM to at least 0-125 deg to be able to descend 3 stairs in step over step pattern using unilateral hand rail    Baseline 0-115 deg.    Time 6   Period Weeks   Status Achieved   PT LONG TERM GOAL #2   Title pt will improve gait speed to at least 0.874m for normalized home mobility using LRAD   Time 6   Period Weeks   Status Achieved   PT LONG TERM GOAL #3   Title pt will be independent and compliant to advanced HEP for conintued home strengthening    Time 6   Period Weeks   Status Achieved   PT LONG TERM GOAL #4   Title pt will decerase LEFS to less than 25% disability to maximize function    Status Partially Met   PT LONG TERM GOAL #5   Title pt will improve berg balance score by 6pts to reduce fall risk    Time 6   Period Weeks   Status Achieved   PT LONG TERM GOAL #6   Title pt will perform 5x sit to stand in <25s demonstrating improved LE strength.    Time 6   Period Weeks   Status Achieved               Plan - 04/16/16 1659    Clinical Impression Statement pt has  achieved PT goals at this time and has also reduced her fall risk. pt will be DC to indpendent HEP at this time.    Rehab Potential Good   Clinical Impairments Affecting Rehab  Potential hx of depression, hx of R knee OA, HTN   PT Frequency 2x / week   PT Duration 6 weeks   PT Treatment/Interventions Aquatic Therapy;Cryotherapy;Electrical Stimulation;Moist Heat;Ultrasound;Neuromuscular re-education;Balance training;Therapeutic exercise;Therapeutic activities;Functional mobility training;Stair training;Gait training;Dry needling;Taping   PT Next Visit Plan Outcome meausres, update goals, recert?, progress stregnth as tolerated    PT Home Exercise Plan As prescribed   Consulted and Agree with Plan of Care Patient      Patient will benefit from skilled therapeutic intervention in order to improve the following deficits and impairments:  Abnormal gait, Decreased activity tolerance, Decreased balance, Decreased strength, Difficulty walking, Hypomobility, Pain, Impaired flexibility, Increased fascial restricitons  Visit Diagnosis: Pain in left knee  Unsteadiness on feet       G-Codes - 05/06/16 1700    Functional Assessment Tool Used 69mwalk test:/ BERG   Functional Limitation Mobility: Walking and moving around   Mobility: Walking and Moving Around Current Status [phone] At least 1 percent but less than 20 percent impaired, limited or restricted   Mobility: Walking and Moving Around Goal Status ([phone] At least 1 percent but less than 20 percent impaired, limited or restricted   Mobility: Walking and Moving Around Discharge Status [phone] At least 1 percent but less than 20 percent impaired, limited or restricted      Problem List Patient Active Problem List   Diagnosis Date Noted  . Tension headache 02/18/2016  . Degenerative arthritis of left knee 12/24/2015  . Left facial pain 10/29/2015  . Memory loss 10/29/2015  . Osteoarthritis Knees, bilateral, moderate 06/16/2015  . Smoker 05/10/2015  . Solitary pulmonary nodule 03/28/2013  . Cardiac murmur 03/15/2012  . Hypertension   . UNSPECIFIED VITAMIN D DEFICIENCY 05/29/2009  . Pure hypercholesterolemia  12/07/2007  . Major depressive disorder, recurrent episode, in partial remission ([place] 12/07/2007  . Diabetes mellitus type 2, controlled ([place] 08/10/2007   ACaryl PinaC. Leightyn Cina, PT, DPT #[id number] Reuven Braver 505/31/2017 5:04 PM  CW[place]burnMAIN RGritman Medical CenterSERVICES [address] [address]Phone: [phone]  Fax:  [phone] Name: DREANNON CANDELLAMRN: [id number] of Birth: [birth year]

## 2016-04-28 DIAGNOSIS — M4806 Spinal stenosis, lumbar region: Secondary | ICD-10-CM | POA: Diagnosis not present

## 2016-04-28 DIAGNOSIS — M5416 Radiculopathy, lumbar region: Secondary | ICD-10-CM | POA: Diagnosis not present

## 2016-05-06 ENCOUNTER — Telehealth: Payer: Self-pay | Admitting: Family Medicine

## 2016-05-06 NOTE — Telephone Encounter (Signed)
Lee @ [employer] called to let you know he has not been able to contact pt since April 11. He stated he calls her every three just to make contact regarding how she is doing and meds He has same contact  number we have.

## 2016-05-11 ENCOUNTER — Other Ambulatory Visit: Payer: Self-pay | Admitting: Nurse Practitioner

## 2016-05-12 ENCOUNTER — Ambulatory Visit: Payer: Medicare Other | Attending: Orthopedic Surgery

## 2016-05-12 DIAGNOSIS — M25562 Pain in left knee: Secondary | ICD-10-CM | POA: Diagnosis not present

## 2016-05-12 DIAGNOSIS — M5441 Lumbago with sciatica, right side: Secondary | ICD-10-CM | POA: Insufficient documentation

## 2016-05-12 DIAGNOSIS — R2681 Unsteadiness on feet: Secondary | ICD-10-CM | POA: Insufficient documentation

## 2016-05-12 NOTE — Therapy (Signed)
[place] MAIN [facility] SERVICES [address] [place], [place], [address] Phone: [phone]   Fax:  [phone]  Physical Therapy Evaluation  Patient Details  Name: Amanda [name] MRN: [id number] Date of Birth: August 21, 1951 Referring Provider: Dr. Marlou Sa  Encounter Date: 05/12/2016      PT End of Session - 05/12/16 1716    Visit Number 1   Number of Visits 17   Date for PT Re-Evaluation 06/09/16   Authorization Type 1/10   PT Start Time 1600   PT Stop Time 1705   PT Time Calculation (min) 65 min   Activity Tolerance No increased pain;Patient tolerated treatment well   Behavior During Therapy [facility] for tasks assessed/performed      Past Medical History  Diagnosis Date  . Depression   . Anxiety   . Hyperlipidemia   . GERD (gastroesophageal reflux disease)   . Tobacco abuse   . Smoker 05/10/2015  . Degenerative arthritis of knee, bilateral 06/16/2015  . Pneumonia     hx of this several times  . Heart murmur     slight per pt- benign   . Hypertension     hx of tachycardia   . History of tachycardia   . Thyroid disease     hyperthyroidism as a teenager - received some injections age 27 that corrected this   . Constipation     uses docusate and fiber  . Diabetes ([place])     diet managed-type 11  . Tachycardia   . Shortness of breath dyspnea     with exertion  . Headache   . Neuromuscular disorder ([place])     nerve damage in back and shoulder    Past Surgical History  Procedure Laterality Date  . Tonsillectomy      4 yoa  . Ovarian cyst removal  1972  . Abdominal hysterectomy  1993  . Wrist fracture surgery  11/2008  . Esophageal dilation      x 3  . Colonoscopy      30 years ago was normal per pt.   . Fractured leg Right   . Salivary gland surgery    . Cholecystectomy    . Back surgery    . Total knee arthroplasty Left 12/24/2015    Procedure: LEFT TOTAL KNEE ARTHROPLASTY;  Surgeon: [name], MD;  Location: [place];   Service: Orthopedics;  Laterality: Left;    There were no vitals filed for this visit.       Subjective Assessment - 05/12/16 1606    Subjective Pt reports she originally hurt her back around age 19, with a lifting injury. she reports she had a herniated disc which she had a discectomy L4-5 per her report. Her pain resolved after that. she reports falling onto her L side 3 years ago. she reports having R sided LBP since that time. pt reports  she has some numbness/tingling in the toes. she reports sciatic pain that can extend down her RLE especially with bending / mopping / sweeping. She recently had lumbar injections 2 weeks ago which she reports has helped, especially with L sided leg pain. She cannot identify any movement/positions that make her pain better.  pt denies any changes with bowel bladder    Pertinent History hx of R knee OA. hx of depression    Diagnostic tests MRI- stenosis    Patient Stated Goals improve back pain    Currently in Pain? Yes   Pain Score 3  Pain Location Back   Pain Orientation Right;Lower   Pain Type Chronic pain            [facility] PT Assessment - 05/12/16 0001    Assessment   Medical Diagnosis Lumbago   Referring Provider Dr. Marlou Sa   Onset Date/Surgical Date 05/12/13   Prior Therapy PT for her knee. no recent PT for lower back   Precautions   Precautions None   Restrictions   Weight Bearing Restrictions No   Balance Screen   Has the patient fallen in the past 6 months No   Has the patient had a decrease in activity level because of a fear of falling?  No   Is the patient reluctant to leave their home because of a fear of falling?  No   Home Ecologist residence   Living Arrangements Spouse/significant other   Available Help at Discharge Family   Type of [place] to enter   Entrance Stairs-Number of Steps [place] One level   [place] - 2 wheels;Cane - single point;Tub bench;Wheelchair - manual   Prior Function   Level of [place] Independent;Independent with community mobility with device  pt reports she did not have LBP 3+ years ago   Vocation Retired          POSTURE/OBSERVATION: Increased kyphosis, forward head, reduced lumbar lordosis, pelvis posteriorly tilted in sitting  Palpation: hypomoble throughout thoracic and lumbar spine Tender to palpation L 2-5 , more tender over R transverse process L 2-5,  Point tenderness over R SIJ Hypertonic paraspinals, tenderness R>L   PROM/AROM: Lumbar fleixon: WNL Lumbar extension : mod loss Lumbar lateral flexion L/R : WNL Lumbar R/L sideglide: WNL with mild pain to the R Hip IR/ER WNL BLE Repeated movement testing: RFIS: no change REIS: no worse Prone lying: no worse Posture correction: reduced pain    STRENGTH:  Graded on a 0-5 scale Muscle Group Left Right  Shoulder flex    Shoulder Abd    Shoulder Ext    Shoulder IR/ER    Elbow    Wrist/hand     Hip Flex 4 4-  Hip Abd 4 4-  Hip Add 4- 4-  Hip Ext 4 4  Hip IR/ER    Knee Flex 4+ 4  Knee Ext 4+ deferred  Ankle DF 5 4+  Ankle PF     Myotomes: WNL bilaterally Dermatomes: deminished L4 on the L Reflexes: L3: L 0, R 2+ S1: L 1+, R 1+   SPECIAL TESTS: SIJ testing: Figure 4: (-) Sacral shear (+) on the R Sacral thrust (-) pelvic compression (-) Pelvic distractions (-) gaenslen test (+) R    FUNCTIONAL MOBILITY: Pt is independent with bed mobility and transfers with increased time.   BALANCE: WFL  GAIT:  Pt with reduced stance time/antalgia on the R, reduced step length bilaterally, slight R lateral shift of the trunk, reduced pelvic rotation   OUTCOME MEASURES: ODI next visit                     PT Education - 05/12/16 1715    Education provided Yes   Education Details exam findings, POC, HEP   Person(s) Educated Patient   Methods Explanation    Comprehension Verbalized understanding             PT Long Term Goals - 05/12/16 1723  PT LONG TERM GOAL #1   Title Pt will demonstrate proper sitting posture with lumbar roll in place independently    Time 8   Period Weeks   Status New   PT LONG TERM GOAL #2   Title pt will be independent and compliant with HEP for pain relief and strengthening.    Time 8   Period Weeks   Status New   PT LONG TERM GOAL #3   Title pt will demonstrate proper mechanics of common household chores to reduce pain and increase function with pain <3/10   Time 8   Period Weeks   Status New   PT LONG TERM GOAL #4   Title pt will sore less than 30% disability on ODI for improved function    Time 8   Period Weeks   Status New               Plan - 05/12/16 1717    Clinical Impression Statement pt presents chronic LBP R>L with assicociated radicular symptoms R>L. based on pts history and exam she demonstrates somewhat of an extension preference and has more peripheral pain with flexion based activities. pt had improved pain / symptoms with extension based positioning and posture correction today. pt also demonstrates pain upon palpation to the lumbar spine, surrounding musculature, and R SIJ. SIJ special testing  was positive 2/5. She also demonstrates impaired LE strength, impaired core strength, and impaired posture. pt would benefit from continued skilled PT services to address these deficitrs to reduce pain and maximize function.    Rehab Potential Good   Clinical Impairments Affecting Rehab Potential co-morbidities, tobacco use, little home support   PT Frequency 2x / week   PT Duration 8 weeks   PT Treatment/Interventions ADLs/Self Care Home Management;Aquatic Therapy;Cryotherapy;Electrical Stimulation;Moist Heat;Traction;Patient/family education;Neuromuscular re-education;Therapeutic exercise;Therapeutic activities;Functional mobility training;Gait training;Manual techniques;Passive range of  motion;Dry needling   Consulted and Agree with Plan of Care Patient      Patient will benefit from skilled therapeutic intervention in order to improve the following deficits and impairments:  Decreased strength, Abnormal gait, Increased muscle spasms, Improper body mechanics, Impaired flexibility, Postural dysfunction, Decreased range of motion, Pain, Hypomobility  Visit Diagnosis: Pain in left knee - Plan: PT plan of care cert/re-cert  Unsteadiness on feet - Plan: PT plan of care cert/re-cert      G-Codes - 05/12/16 1727    Functional Assessment Tool Used ROM/NRPS/sitting tolerance    Functional Limitation Changing and maintaining body position   Changing and Maintaining Body Position Current Status (W4132(G8981) At least 20 percent but less than 40 percent impaired, limited or restricted   Changing and Maintaining Body Position Goal Status (G4010(G8982) At least 1 percent but less than 20 percent impaired, limited or restricted       Problem List Patient Active Problem List   Diagnosis Date Noted  . Tension headache 02/18/2016  . Degenerative arthritis of left knee 12/24/2015  . Left facial pain 10/29/2015  . Memory loss 10/29/2015  . Osteoarthritis Knees, bilateral, moderate 06/16/2015  . Smoker 05/10/2015  . Solitary pulmonary nodule 03/28/2013  . Cardiac murmur 03/15/2012  . Hypertension   . UNSPECIFIED VITAMIN D DEFICIENCY 05/29/2009  . Pure hypercholesterolemia 12/07/2007  . Major depressive disorder, recurrent episode, in partial remission (HCC) 12/07/2007  . Diabetes mellitus type 2, controlled (HCC) 08/10/2007   Morrie SheldonAshley C. Cheronda Erck, PT, DPT [phone]  Prashant Glosser 05/12/2016, 5:31 PM  [place] [facility] [address]  [place], [place], [address] Phone: [phone]   Fax:  [phone]  Name: Amanda [name] MRN: [id number] Date of Birth: 28-Nov-1951

## 2016-05-12 NOTE — Patient Instructions (Signed)
[email or web address] Prone on elbows 3 min x 3/day REIS: x 10 at counter top 3x/day  Posture ed with lumbar roll in sitting

## 2016-05-13 ENCOUNTER — Other Ambulatory Visit: Payer: Self-pay | Admitting: Nurse Practitioner

## 2016-05-14 ENCOUNTER — Ambulatory Visit: Payer: Medicare Other

## 2016-05-14 DIAGNOSIS — M5441 Lumbago with sciatica, right side: Secondary | ICD-10-CM

## 2016-05-14 DIAGNOSIS — R2681 Unsteadiness on feet: Secondary | ICD-10-CM | POA: Diagnosis not present

## 2016-05-14 DIAGNOSIS — M25562 Pain in left knee: Secondary | ICD-10-CM | POA: Diagnosis not present

## 2016-05-14 NOTE — Therapy (Signed)
[place] MAIN [facility] SERVICES [address] [place], [place], [address] Phone: [phone]   Fax:  [phone]  Physical Therapy Treatment  Patient Details  Name: Amanda [name] MRN: [id number] Date of Birth: 1951/10/28 Referring Provider: Dr. Marlou Sa  Encounter Date: 05/14/2016      PT End of Session - 05/14/16 1744    Visit Number 2   Number of Visits 17   Date for PT Re-Evaluation 06/09/16   Authorization Type 2/10   PT Start Time 1515   PT Stop Time 1600   PT Time Calculation (min) 45 min   Activity Tolerance No increased pain;Patient tolerated treatment well   Behavior During Therapy [facility] for tasks assessed/performed      Past Medical History  Diagnosis Date  . Depression   . Anxiety   . Hyperlipidemia   . GERD (gastroesophageal reflux disease)   . Tobacco abuse   . Smoker 05/10/2015  . Degenerative arthritis of knee, bilateral 06/16/2015  . Pneumonia     hx of this several times  . Heart murmur     slight per pt- benign   . Hypertension     hx of tachycardia   . History of tachycardia   . Thyroid disease     hyperthyroidism as a teenager - received some injections age 34 that corrected this   . Constipation     uses docusate and fiber  . Diabetes ([place])     diet managed-type 11  . Tachycardia   . Shortness of breath dyspnea     with exertion  . Headache   . 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 Tonsillectomy      4 yoa  . Ovarian cyst removal  1972  . Abdominal hysterectomy  1993  . Wrist fracture surgery  11/2008  . Esophageal dilation      x 3  . Colonoscopy      30 years ago was normal per pt.   . Fractured leg Right   . Salivary gland surgery    . Cholecystectomy    . Back surgery    . Total knee arthroplasty Left 12/24/2015    Procedure: LEFT TOTAL KNEE ARTHROPLASTY;  Surgeon: [name], MD;  Location: [place];   Service: Orthopedics;  Laterality: Left;    There were no vitals filed for this visit.      Subjective Assessment - 05/14/16 1743    Subjective pt reports the initial HEP went well. she is having very little sciatic pain and was able to do more household chores.    Pertinent History hx of R knee OA. hx of depression    Diagnostic tests MRI- stenosis    Patient Stated Goals improve back pain    Currently in Pain? Yes   Pain Score 3    Pain Location Back   Pain Orientation Lower   Pain Descriptors / Indicators Aching   Pain Type Chronic pain       Therex: PT reassessed lumbar ROM: Flexion: WNL Extension: min loss Sidebending: min loss bilaterally  Pt instruction on Diaphragmatic breathing (initiating TA contraction) with mod cues reduced to min cues in supine x 8 min Diaphragmatic breathing (initiating TA contraction) with bridging 3 x 10 with mod cues reduced to min cues as performance improved Diaphragmatic breathing (initiating TA contraction) with alt LE extension in prone (bend  knee) pillow under hips 2 x 10 Diaphragmatic breathing (initiating TA contraction) with alt hip ER yellow band 2 x 10 (supine)  No increased pain with exercise                          PT Education - 05/14/16 1744    Education provided Yes   Education Details diaphragmatic breathing    Person(s) Educated Patient   Methods Explanation;Tactile cues;Verbal cues   Comprehension Verbalized understanding;Returned demonstration;Verbal cues required             PT Long Term Goals - 05/12/16 1723    PT LONG TERM GOAL #1   Title Pt will demonstrate proper sitting posture with lumbar roll in place independently    Time 8   Period Weeks   Status New   PT LONG TERM GOAL #2   Title pt will be independent and compliant with HEP for pain relief and strengthening.    Time 8   Period Weeks   Status New   PT LONG TERM GOAL #3   Title pt will demonstrate proper mechanics of  common household chores to reduce pain and increase function with pain <3/10   Time 8   Period Weeks   Status New   PT LONG TERM GOAL #4   Title pt will sore less than 30% disability on ODI for improved function    Time 8   Period Weeks   Status New               Plan - 05/14/16 1745    Clinical Impression Statement pt has responded well intially to extension based mechanical therapy for the lumbar spine. her symptoms are centralized to the lower back at this time and she was able to do more household chores with less pain. progressed core strengthening today which was somwhat difficult for her to coordinate diaphragmatic breathing to initiate TA.    Rehab Potential Good   Clinical Impairments Affecting Rehab Potential co-morbidities, tobacco use, little home support   PT Frequency 2x / week   PT Duration 8 weeks   PT Treatment/Interventions ADLs/Self Care Home Management;Aquatic Therapy;Cryotherapy;Electrical Stimulation;Moist Heat;Traction;Patient/family education;Neuromuscular re-education;Therapeutic exercise;Therapeutic activities;Functional mobility training;Gait training;Manual techniques;Passive range of motion;Dry needling   Consulted and Agree with Plan of Care Patient      Patient will benefit from skilled therapeutic intervention in order to improve the following deficits and impairments:  Decreased strength, Abnormal gait, Increased muscle spasms, Improper body mechanics, Impaired flexibility, Postural dysfunction, Decreased range of motion, Pain, Hypomobility  Visit Diagnosis: Lumbago with sciatica, right side     Problem List Patient Active Problem List   Diagnosis Date Noted  . Tension headache 02/18/2016  . Degenerative arthritis of left knee 12/24/2015  . Left facial pain 10/29/2015  . Memory loss 10/29/2015  . Osteoarthritis Knees, bilateral, moderate 06/16/2015  . Smoker 05/10/2015  . Solitary pulmonary nodule 03/28/2013  . Cardiac murmur 03/15/2012   . Hypertension   . UNSPECIFIED VITAMIN D DEFICIENCY 05/29/2009  . Pure hypercholesterolemia 12/07/2007  . Major depressive disorder, recurrent episode, in partial remission ([place]) 12/07/2007  . Diabetes mellitus type 2, controlled ([place]) 08/10/2007   Caryl Pina C. Floyde Dingley, PT, DPT [phone]  Rondall Radigan 05/14/2016, 6:17 PM  [place] MAIN [facility] SERVICES [address] [place], [place], [address] Phone: [phone]   Fax:  [phone]  Name: Amanda [name] MRN: [id number] Date of Birth: 03/28/1951

## 2016-05-17 ENCOUNTER — Other Ambulatory Visit: Payer: Self-pay | Admitting: Nurse Practitioner

## 2016-05-19 ENCOUNTER — Ambulatory Visit: Payer: Medicare Other

## 2016-05-19 DIAGNOSIS — M5441 Lumbago with sciatica, right side: Secondary | ICD-10-CM

## 2016-05-19 DIAGNOSIS — R2681 Unsteadiness on feet: Secondary | ICD-10-CM | POA: Diagnosis not present

## 2016-05-19 DIAGNOSIS — M25562 Pain in left knee: Secondary | ICD-10-CM | POA: Diagnosis not present

## 2016-05-19 NOTE — Therapy (Signed)
[place] MAIN [facility] SERVICES [address] [place], [place], [address] Phone: [phone]   Fax:  [phone]  Physical Therapy Treatment  Patient Details  Name: Amanda [name] MRN: [id number] Date of Birth: 31-Dec-1950 Referring Provider: Dr. Marlou Sa  Encounter Date: 05/19/2016      PT End of Session - 05/19/16 1659    Visit Number 3   Number of Visits 17   Date for PT Re-Evaluation 06/09/16   Authorization Type 3/10   PT Start Time 1600   PT Stop Time 1655   PT Time Calculation (min) 55 min   Activity Tolerance No increased pain;Patient tolerated treatment well   Behavior During Therapy [facility] for tasks assessed/performed      Past Medical History  Diagnosis Date  . Depression   . Anxiety   . Hyperlipidemia   . GERD (gastroesophageal reflux disease)   . Tobacco abuse   . Smoker 05/10/2015  . Degenerative arthritis of knee, bilateral 06/16/2015  . Pneumonia     hx of this several times  . Heart murmur     slight per pt- benign   . Hypertension     hx of tachycardia   . History of tachycardia   . Thyroid disease     hyperthyroidism as a teenager - received some injections age 64 that corrected this   . Constipation     uses docusate and fiber  . Diabetes ([place])     diet managed-type 11  . Tachycardia   . Shortness of breath dyspnea     with exertion  . Headache   . 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 Tonsillectomy      4 yoa  . Ovarian cyst removal  1972  . Abdominal hysterectomy  1993  . Wrist fracture surgery  11/2008  . Esophageal dilation      x 3  . Colonoscopy      30 years ago was normal per pt.   . Fractured leg Right   . Salivary gland surgery    . Cholecystectomy    . Back surgery    . Total knee arthroplasty Left 12/24/2015    Procedure: LEFT TOTAL KNEE ARTHROPLASTY;  Surgeon: [name], MD;  Location: [place];   Service: Orthopedics;  Laterality: Left;    There were no vitals filed for this visit.      Subjective Assessment - 05/19/16 1602    Subjective pt reports doing yardwork over the weekend which caused her some discomfort in her back but pain is no worse today. she reported intermittent shooting pains down RLE but no worse.   Pertinent History hx of R knee OA. hx of depression    Diagnostic tests MRI- stenosis    Patient Stated Goals improve back pain    Pain Score 3    Pain Location Back   Pain Orientation Right   Pain Descriptors / Indicators Constant      THEREX bridging with diaphragmatic breathing, 2x20, min verbal cues for proper breathing standing hip abd with RTB 2x10 R, 1x10 L; R knee pain limiting exercises on L standing hip flexion with RTB 2x10 L, 1x10 R; R glute pain limiting exercise bil side stepping with RTB x 5 laps; mod verbal and tactile cues to decrease lateral trunk lean R>L   MANUAL trigger point ischemic release and cross friction massage  to R SIJ (glute, erector spinae insertion) x 20 min; pt reported decrease pain to 1.5/10 after grade 2-3 CPA sacrum 3x60 sec bouts; pt reported decrease pain to 1.5/10 after Grade 2-3 distraction RLE 3x60 sec bouts; pt reported decr pain after          PT Education - 05/19/16 1658    Education provided Yes   Education Details breathing technique, HEP   Person(s) Educated Patient   Methods Explanation   Comprehension Verbalized understanding             PT Long Term Goals - 05/12/16 1723    PT LONG TERM GOAL #1   Title Pt will demonstrate proper sitting posture with lumbar roll in place independently    Time 8   Period Weeks   Status New   PT LONG TERM GOAL #2   Title pt will be independent and compliant with HEP for pain relief and strengthening.    Time 8   Period Weeks   Status New   PT LONG TERM GOAL #3   Title pt will demonstrate proper mechanics of common household chores to reduce pain and  increase function with pain <3/10   Time 8   Period Weeks   Status New   PT LONG TERM GOAL #4   Title pt will sore less than 30% disability on ODI for improved function    Time 8   Period Weeks   Status New               Plan - 05/19/16 1700    Clinical Impression Statement pt making progress towards goals. she presented today with continued decreased pain after last treatment session but did complain of some LBP after performing yardwork this weekend, however it is no worse. pt demonstrated good carry over with her diaphragmatic breathing. pt tolerated and reported decreased pain following trigger point ischemic release and CPA mobilization of sacrum. pt reported pain in R glutes with resisted R hip flexion whichi was addressed with distraction mobs; pt reported decreased pain following distraction. pt needs continued skilled intervention to address remaining deficits in order to maximize overall function and decrease risk of falls.   Rehab Potential Good   Clinical Impairments Affecting Rehab Potential co-morbidities, tobacco use, little home support   PT Frequency 2x / week   PT Duration 8 weeks   PT Treatment/Interventions ADLs/Self Care Home Management;Aquatic Therapy;Cryotherapy;Electrical Stimulation;Moist Heat;Traction;Patient/family education;Neuromuscular re-education;Therapeutic exercise;Therapeutic activities;Functional mobility training;Gait training;Manual techniques;Passive range of motion;Dry needling   PT Next Visit Plan MET for SIJ, manual, hip strengthening   Consulted and Agree with Plan of Care Patient      Patient will benefit from skilled therapeutic intervention in order to improve the following deficits and impairments:  Decreased strength, Abnormal gait, Increased muscle spasms, Improper body mechanics, Impaired flexibility, Postural dysfunction, Decreased range of motion, Pain, Hypomobility  Visit Diagnosis: Lumbago with sciatica, right  side     Problem List Patient Active Problem List   Diagnosis Date Noted  . Tension headache 02/18/2016  . Degenerative arthritis of left knee 12/24/2015  . Left facial pain 10/29/2015  . Memory loss 10/29/2015  . Osteoarthritis Knees, bilateral, moderate 06/16/2015  . Smoker 05/10/2015  . Solitary pulmonary nodule 03/28/2013  . Cardiac murmur 03/15/2012  . Hypertension   . UNSPECIFIED VITAMIN D DEFICIENCY 05/29/2009  . Pure hypercholesterolemia 12/07/2007  . Major depressive disorder, recurrent episode, in partial remission ([place]) 12/07/2007  . Diabetes mellitus type 2, controlled ([place]) 08/10/2007  Geraldine Solar, SPT This entire session was performed under direct supervision and direction of a licensed therapist/therapist assistant . I have personally read, edited and approve of the note as written. Gorden Harms. Tortorici, PT, DPT [phone]  Tortorici,Ashley 05/19/2016, 5:27 PM  [place] MAIN [facility] SERVICES [address] [place], [place], [address] Phone: [phone]   Fax:  [phone]  Name: Amanda [name] MRN: [id number] Date of Birth: [birth year]

## 2016-05-21 ENCOUNTER — Ambulatory Visit: Payer: Medicare Other

## 2016-05-21 DIAGNOSIS — M5441 Lumbago with sciatica, right side: Secondary | ICD-10-CM

## 2016-05-21 DIAGNOSIS — M25562 Pain in left knee: Secondary | ICD-10-CM | POA: Diagnosis not present

## 2016-05-21 DIAGNOSIS — R2681 Unsteadiness on feet: Secondary | ICD-10-CM | POA: Diagnosis not present

## 2016-05-21 NOTE — Therapy (Addendum)
[place] MAIN [facility] SERVICES [address] [place], [place], [address] Phone: [phone]   Fax:  [phone]  Physical Therapy Treatment  Patient Details  Name: Amanda [name] MRN: [id number] Date of Birth: 22-Nov-1951 Referring Provider: Dr. Marlou Sa  Encounter Date: 05/21/2016      PT End of Session - 05/21/16 1728    Visit Number 4   Number of Visits 17   Date for PT Re-Evaluation 06/09/16   Authorization Type 4/10   PT Start Time 4287   PT Stop Time 6811   PT Time Calculation (min) 50 min   Activity Tolerance No increased pain;Patient tolerated treatment well   Behavior During Therapy [facility] for tasks assessed/performed      Past Medical History  Diagnosis Date  . Depression   . Anxiety   . Hyperlipidemia   . GERD (gastroesophageal reflux disease)   . Tobacco abuse   . Smoker 05/10/2015  . Degenerative arthritis of knee, bilateral 06/16/2015  . Pneumonia     hx of this several times  . Heart murmur     slight per pt- benign   . Hypertension     hx of tachycardia   . History of tachycardia   . Thyroid disease     hyperthyroidism as a teenager - received some injections age 45 that corrected this   . Constipation     uses docusate and fiber  . Diabetes ([place])     diet managed-type 11  . Tachycardia   . Shortness of breath dyspnea     with exertion  . Headache   . 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 Tonsillectomy      4 yoa  . Ovarian cyst removal  1972  . Abdominal hysterectomy  1993  . Wrist fracture surgery  11/2008  . Esophageal dilation      x 3  . Colonoscopy      30 years ago was normal per pt.   . Fractured leg Right   . Salivary gland surgery    . Cholecystectomy    . Back surgery    . Total knee arthroplasty Left 12/24/2015    Procedure: LEFT TOTAL KNEE ARTHROPLASTY;  Surgeon: [name], MD;  Location: [place];   Service: Orthopedics;  Laterality: Left;    There were no vitals filed for this visit.      Subjective Assessment - 05/21/16 1725    Subjective pt reports continued LBP but that her hip has felt a little better since last session but that it hurts to lift her leg into her car.   Pertinent History hx of R knee OA. hx of depression    Diagnostic tests MRI- stenosis    Patient Stated Goals improve back pain    Pain Score 4    Pain Location Back   Pain Orientation Right   Pain Descriptors / Indicators Sore     therex: bil s/l clamshells with diaphragmatic breathing 2 x 10 each; min cues for proper breathing; pt had pain in R hip around greater trochanter at end range of exercise; educated to stop before pain Self trigger point release to R piriformis x 90 s  With tennis ball Supine piriformis stretch using towel 30s x 2 Manual: Trigger point ischemic release and cross friction at R piriformis insertion x 20 min Manual R piriformis stretch x  5 min; min verbal/tactile cues to decrease muscle guarding; pt with increased PROM and decreased pain following Manual MET R piriformis throughout range (ER to IR) with 6 sec hold; pt c/o slight pain at piriformis origin with MET at end range IR.       PT Education - 05/21/16 1727    Education provided Yes   Education Details breathing during exercise, piriformis stretch and soft tissue release for HEP   Person(s) Educated Patient   Methods Explanation   Comprehension Verbalized understanding;Returned demonstration             PT Long Term Goals - 05/12/16 1723    PT LONG TERM GOAL #1   Title Pt will demonstrate proper sitting posture with lumbar roll in place independently    Time 8   Period Weeks   Status New   PT LONG TERM GOAL #2   Title pt will be independent and compliant with HEP for pain relief and strengthening.    Time 8   Period Weeks   Status New   PT LONG TERM GOAL #3   Title pt will demonstrate proper mechanics of  common household chores to reduce pain and increase function with pain <3/10   Time 8   Period Weeks   Status New   PT LONG TERM GOAL #4   Title pt will sore less than 30% disability on ODI for improved function    Time 8   Period Weeks   Status New               Plan - 05/21/16 1729    Clinical Impression Statement pt continues to make steady progress towards goals. Her lumbar extension ROM continues to improve. She still complained of R hip pain at greater trochanter with active flexion and ER. PT palpated at greater troch which was moderately tender to manual techniques but pt reported decreased pain following trigger point ischemic release and cross friction massage. PT performed piriformis stretch and MET of piriformis which pt responded to well with decreased pain and increased AROM. pt given updated HEP to include self sotf tissue release of piriformis by sitting on tennis ball and self piriformis stretch in supine with towel behind knee. pt still has deficits in strength, functional movements, and pain and needs continued skilled PT intervention to maximize overall function.    Rehab Potential Good   Clinical Impairments Affecting Rehab Potential co-morbidities, tobacco use, little home support   PT Frequency 2x / week   PT Duration 8 weeks   PT Treatment/Interventions ADLs/Self Care Home Management;Aquatic Therapy;Cryotherapy;Electrical Stimulation;Moist Heat;Traction;Patient/family education;Neuromuscular re-education;Therapeutic exercise;Therapeutic activities;Functional mobility training;Gait training;Manual techniques;Passive range of motion;Dry needling   PT Next Visit Plan MET for SIJ, manual, hip strengthening   Consulted and Agree with Plan of Care Patient      Patient will benefit from skilled therapeutic intervention in order to improve the following deficits and impairments:  Decreased strength, Abnormal gait, Increased muscle spasms, Improper body mechanics,  Impaired flexibility, Postural dysfunction, Decreased range of motion, Pain, Hypomobility  Visit Diagnosis: Lumbago with sciatica, right side     Problem List Patient Active Problem List   Diagnosis Date Noted  . Tension headache 02/18/2016  . Degenerative arthritis of left knee 12/24/2015  . Left facial pain 10/29/2015  . Memory loss 10/29/2015  . Osteoarthritis Knees, bilateral, moderate 06/16/2015  . Smoker 05/10/2015  . Solitary pulmonary nodule 03/28/2013  . Cardiac murmur 03/15/2012  . Hypertension   . UNSPECIFIED VITAMIN  D DEFICIENCY 05/29/2009  . Pure hypercholesterolemia 12/07/2007  . Major depressive disorder, recurrent episode, in partial remission ([place]) 12/07/2007  . Diabetes mellitus type 2, controlled ([place]) 08/10/2007   Geraldine Solar, SPT This entire session was performed under direct supervision and direction of a licensed therapist/therapist assistant . I have personally read, edited and approve of the note as written.  Gorden Harms. Tortorici, PT, DPT [phone]  Tortorici,Ashley 05/21/2016, 6:20 PM  [place] [facility] MAIN [facility] SERVICES [address] [place], [place], [address] Phone: [phone]   Fax:  [phone]  Name: Amanda [name] MRN: [id number] Date of Birth: Sep 15, 1951

## 2016-05-26 ENCOUNTER — Ambulatory Visit: Payer: Medicare Other

## 2016-05-26 DIAGNOSIS — R2681 Unsteadiness on feet: Secondary | ICD-10-CM | POA: Diagnosis not present

## 2016-05-26 DIAGNOSIS — M5441 Lumbago with sciatica, right side: Secondary | ICD-10-CM

## 2016-05-26 DIAGNOSIS — M25562 Pain in left knee: Secondary | ICD-10-CM | POA: Diagnosis not present

## 2016-05-26 NOTE — Therapy (Signed)
[place] MAIN [facility] SERVICES [address] [place], [place], [address] Phone: [phone]   Fax:  [phone]  Physical Therapy Treatment  Patient Details  Name: NANETTA WIEGMAN MRN: [id number] Date of Birth: [birth year] Referring Provider: Dr. Marlou Sa  Encounter Date: 05/26/2016      PT End of Session - 05/26/16 1619    Visit Number 5   Number of Visits 17   Date for PT Re-Evaluation 06/09/16   Authorization Type 5/10   PT Start Time 1520   PT Stop Time 1605   PT Time Calculation (min) 45 min   Activity Tolerance No increased pain;Patient tolerated treatment well   Behavior During Therapy [facility] for tasks assessed/performed      Past Medical History  Diagnosis Date  . Depression   . Anxiety   . Hyperlipidemia   . GERD (gastroesophageal reflux disease)   . Tobacco abuse   . Smoker 05/10/2015  . Degenerative arthritis of knee, bilateral 06/16/2015  . Pneumonia     hx of this several times  . Heart murmur     slight per pt- benign   . Hypertension     hx of tachycardia   . History of tachycardia   . Thyroid disease     hyperthyroidism as a teenager - received some injections age 3 that corrected this   . Constipation     uses docusate and fiber  . Diabetes ([place])     diet managed-type 11  . Tachycardia   . Shortness of breath dyspnea     with exertion  . Headache   . 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 Tonsillectomy      4 yoa  . Ovarian cyst removal  1972  . Abdominal hysterectomy  1993  . Wrist fracture surgery  11/2008  . Esophageal dilation      x 3  . Colonoscopy      30 years ago was normal per pt.   . Fractured leg Right   . Salivary gland surgery    . Cholecystectomy    . Back surgery    . Total knee arthroplasty Left 12/24/2015    Procedure: LEFT TOTAL KNEE ARTHROPLASTY;  Surgeon: [name], MD;  Location: [place];   Service: Orthopedics;  Laterality: Left;    There were no vitals filed for this visit.      Subjective Assessment - 05/26/16 1542    Subjective pt reports slight LBP today and states that her R hip felt better after last treatment. She states it was easier to get into her car.   Pertinent History hx of R knee OA. hx of depression    Diagnostic tests MRI- stenosis    Patient Stated Goals improve back pain    Pain Score 3    Pain Location Back   Pain Orientation Right   Pain Descriptors / Indicators Aching      THEREX self piriformis stretch 3 x 1 min; min cues for breathing and technique resisted walking fwd, retro, bil side stepping with 7.5# x 3 laps each; mod verbal and tactile cues to decrease hip rotation and lateral trunk flexion throughout; CGA throughout; pt c/o R knee pain throughout monster walks with RTB fwd/retro at therapy table for balance x 5 laps; min cues for proper exercise technique and CGA for slight unsteadiness throughout;   MANUAL  Trigger point ischemic release and cross friction soft tissue techniques to proximal and distal piriformis attachment MET to piriformis throughout range with 6 sec hold; pt able to go further into hip IR this session       PT Education - 05/26/16 1543    Education provided Yes   Education Details proper exercise technique, HEP   Person(s) Educated Patient   Methods Explanation   Comprehension Verbalized understanding             PT Long Term Goals - 05/12/16 1723    PT LONG TERM GOAL #1   Title Pt will demonstrate proper sitting posture with lumbar roll in place independently    Time 8   Period Weeks   Status New   PT LONG TERM GOAL #2   Title pt will be independent and compliant with HEP for pain relief and strengthening.    Time 8   Period Weeks   Status New   PT LONG TERM GOAL #3   Title pt will demonstrate proper mechanics of common household chores to reduce pain and increase function with pain <3/10    Time 8   Period Weeks   Status New   PT LONG TERM GOAL #4   Title pt will sore less than 30% disability on ODI for improved function    Time 8   Period Weeks   Status New               Plan - 05/26/16 1621    Clinical Impression Statement pt making progress towards goals as evidenced by decreased pain and improved function. pt R hip pain has decreased since last session but pt still presents with increased soft tissue restrictions of proximal and distal piriformis which recreate pt's pain with palpation. pt tolerated manual therapy well and reported decreased pain following. She still presents with decreased hip strength bilaterally and complained of L hip muscle fatigue during and following exercies. Pt needs continued skilled PT intervention to address remaining deficits to maximize overall function.   Rehab Potential Good   Clinical Impairments Affecting Rehab Potential co-morbidities, tobacco use, little home support   PT Frequency 2x / week   PT Duration 8 weeks   PT Treatment/Interventions ADLs/Self Care Home Management;Aquatic Therapy;Cryotherapy;Electrical Stimulation;Moist Heat;Traction;Patient/family education;Neuromuscular re-education;Therapeutic exercise;Therapeutic activities;Functional mobility training;Gait training;Manual techniques;Passive range of motion;Dry needling   PT Next Visit Plan MET for SIJ, manual, hip strengthening   Consulted and Agree with Plan of Care Patient      Patient will benefit from skilled therapeutic intervention in order to improve the following deficits and impairments:  Decreased strength, Abnormal gait, Increased muscle spasms, Improper body mechanics, Impaired flexibility, Postural dysfunction, Decreased range of motion, Pain, Hypomobility  Visit Diagnosis: Lumbago with sciatica, right side     Problem List Patient Active Problem List   Diagnosis Date Noted  . Tension headache 02/18/2016  . Degenerative arthritis of left knee  12/24/2015  . Left facial pain 10/29/2015  . Memory loss 10/29/2015  . Osteoarthritis Knees, bilateral, moderate 06/16/2015  . Smoker 05/10/2015  . Solitary pulmonary nodule 03/28/2013  . Cardiac murmur 03/15/2012  . Hypertension   . UNSPECIFIED VITAMIN D DEFICIENCY 05/29/2009  . Pure hypercholesterolemia 12/07/2007  . Major depressive disorder, recurrent episode, in partial remission ([place]) 12/07/2007  . Diabetes mellitus type 2, controlled ([place]) 08/10/2007   Geraldine Solar, SPT This entire session was performed under direct supervision and direction of a licensed therapist/therapist assistant . I have personally read,  edited and approve of the note as written. Gorden Harms. Tortorici, PT, DPT [phone]   Tortorici,Ashley 05/26/2016, 4:33 PM  [place] MAIN [facility] SERVICES [address] [place], [place], [address] Phone: [phone]   Fax:  [phone]  Name: XANIYAH BUCHHOLZ MRN: [id number] Date of Birth: 1951-11-03

## 2016-06-02 ENCOUNTER — Ambulatory Visit: Payer: Medicare Other

## 2016-06-02 DIAGNOSIS — M5441 Lumbago with sciatica, right side: Secondary | ICD-10-CM | POA: Diagnosis not present

## 2016-06-02 DIAGNOSIS — R2681 Unsteadiness on feet: Secondary | ICD-10-CM | POA: Diagnosis not present

## 2016-06-02 DIAGNOSIS — M25562 Pain in left knee: Secondary | ICD-10-CM | POA: Diagnosis not present

## 2016-06-02 NOTE — Therapy (Signed)
[place] MAIN [facility] SERVICES [address] [place], [place], [address] Phone: [phone]   Fax:  [phone]  Physical Therapy Treatment  Patient Details  Name: Amanda [name] MRN: [id number] Date of Birth: 29-Aug-1951 Referring Provider: Dr. Marlou Sa  Encounter Date: 06/02/2016      PT End of Session - 06/02/16 1605    Visit Number 6   Number of Visits 17   Date for PT Re-Evaluation 06/09/16   Authorization Type 6/10   PT Start Time 1520   PT Stop Time 1600   PT Time Calculation (min) 40 min   Activity Tolerance No increased pain;Patient tolerated treatment well   Behavior During Therapy [facility] for tasks assessed/performed      Past Medical History  Diagnosis Date  . Depression   . Anxiety   . Hyperlipidemia   . GERD (gastroesophageal reflux disease)   . Tobacco abuse   . Smoker 05/10/2015  . Degenerative arthritis of knee, bilateral 06/16/2015  . Pneumonia     hx of this several times  . Heart murmur     slight per pt- benign   . Hypertension     hx of tachycardia   . History of tachycardia   . Thyroid disease     hyperthyroidism as a teenager - received some injections age 65 that corrected this   . Constipation     uses docusate and fiber  . Diabetes ([place])     diet managed-type 11  . Tachycardia   . Shortness of breath dyspnea     with exertion  . Headache   . Neuromuscular disorder (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 Tonsillectomy      4 yoa  . Ovarian cyst removal  1972  . Abdominal hysterectomy  1993  . Wrist fracture surgery  11/2008  . Esophageal dilation      x 3  . Colonoscopy      30 years ago was normal per pt.   . Fractured leg Right   . Salivary gland surgery    . Cholecystectomy    . Back surgery    . Total knee arthroplasty Left 12/24/2015    Procedure: LEFT TOTAL KNEE ARTHROPLASTY;  Surgeon: [name], MD;  Location: [place];   Service: Orthopedics;  Laterality: Left;    There were no vitals filed for this visit.      Subjective Assessment - 06/02/16 1522    Subjective pt reports tripping yesterday on her cement driveway and twisting her back to prevent herself from falling which has flared up her back pain.   Pertinent History hx of R knee OA. hx of depression    Diagnostic tests MRI- stenosis    Patient Stated Goals improve back pain    Pain Score 5    Pain Location Back   Pain Orientation Right   Pain Descriptors / Indicators Sore       MANUAL Deep ischemic release and cross friction massage to proximal and distal attachments of piriformis, glute med and max Manual glute med stretch x 1 min with overpressure Manual piriformis stretch x 1 min with overpressure Manual long axis stretch with belt into hip flexion, adduction, and IR x 5 min       PT Education - 06/02/16 1604    Education provided Yes   Education Details HEP, pain   Person(s) Educated  Patient   Methods Explanation   Comprehension Verbalized understanding             PT Long Term Goals - 05/12/16 1723    PT LONG TERM GOAL #1   Title Pt will demonstrate proper sitting posture with lumbar roll in place independently    Time 8   Period Weeks   Status New   PT LONG TERM GOAL #2   Title pt will be independent and compliant with HEP for pain relief and strengthening.    Time 8   Period Weeks   Status New   PT LONG TERM GOAL #3   Title pt will demonstrate proper mechanics of common household chores to reduce pain and increase function with pain <3/10   Time 8   Period Weeks   Status New   PT LONG TERM GOAL #4   Title pt will sore less than 30% disability on ODI for improved function    Time 8   Period Weeks   Status New               Plan - 06/02/16 1605    Clinical Impression Statement pt presents to therapy today with increased LBP/R hip pain s/p tripping yesterday at home. She had increased soft tissue  restrictions and painful palpation of R piriformis, glute med, and max today which was addressed with manual therapy and manual stretching. pt tolerated manual treatment well and stated that her original pain at the beginning of the session had decreased but that she was sore from the soft tissue techniques. pt needs continued skilled PT intervention to address remaining deficits to maximize overall function.   Rehab Potential Good   Clinical Impairments Affecting Rehab Potential co-morbidities, tobacco use, little home support   PT Frequency 2x / week   PT Duration 8 weeks   PT Treatment/Interventions ADLs/Self Care Home Management;Aquatic Therapy;Cryotherapy;Electrical Stimulation;Moist Heat;Traction;Patient/family education;Neuromuscular re-education;Therapeutic exercise;Therapeutic activities;Functional mobility training;Gait training;Manual techniques;Passive range of motion;Dry needling   PT Next Visit Plan MET for SIJ, manual, hip strengthening   Consulted and Agree with Plan of Care Patient      Patient will benefit from skilled therapeutic intervention in order to improve the following deficits and impairments:  Decreased strength, Abnormal gait, Increased muscle spasms, Improper body mechanics, Impaired flexibility, Postural dysfunction, Decreased range of motion, Pain, Hypomobility  Visit Diagnosis: Lumbago with sciatica, right side     Problem List Patient Active Problem List   Diagnosis Date Noted  . Tension headache 02/18/2016  . Degenerative arthritis of left knee 12/24/2015  . Left facial pain 10/29/2015  . Memory loss 10/29/2015  . Osteoarthritis Knees, bilateral, moderate 06/16/2015  . Smoker 05/10/2015  . Solitary pulmonary nodule 03/28/2013  . Cardiac murmur 03/15/2012  . Hypertension   . UNSPECIFIED VITAMIN D DEFICIENCY 05/29/2009  . Pure hypercholesterolemia 12/07/2007  . Major depressive disorder, recurrent episode, in partial remission ([place]) 12/07/2007  .  Diabetes mellitus type 2, controlled ([place]) 08/10/2007    Geraldine Solar, SPT This entire session was performed under direct supervision and direction of a licensed therapist/therapist assistant . I have personally read, edited and approve of the note as written. Gorden Harms. Tortorici, PT, DPT [phone]  Tortorici,Ashley 06/03/2016, 9:15 AM  [place] MAIN [facility] SERVICES [address] [place], [place], [address] Phone: [phone]   Fax:  [phone]  Name: ADREANNA FICKEL MRN: [id number] Date of Birth: 02-24-1951

## 2016-06-04 ENCOUNTER — Ambulatory Visit: Payer: Medicare Other

## 2016-06-04 DIAGNOSIS — M5441 Lumbago with sciatica, right side: Secondary | ICD-10-CM | POA: Diagnosis not present

## 2016-06-04 DIAGNOSIS — M25562 Pain in left knee: Secondary | ICD-10-CM | POA: Diagnosis not present

## 2016-06-04 DIAGNOSIS — R2681 Unsteadiness on feet: Secondary | ICD-10-CM | POA: Diagnosis not present

## 2016-06-04 NOTE — Therapy (Signed)
[place] MAIN [facility] SERVICES [address] [place], [place], [address] Phone: [phone]   Fax:  [phone]  Physical Therapy Treatment  Patient Details  Name: Amanda [name] MRN: [id number] Date of Birth: Apr 07, 1951 Referring Provider: Dr. Marlou Sa  Encounter Date: 06/04/2016      PT End of Session - 06/04/16 1610    Visit Number 7   Number of Visits 17   Date for PT Re-Evaluation 06/09/16   Authorization Type 7/10   PT Start Time 1520   PT Stop Time 1600   PT Time Calculation (min) 40 min   Activity Tolerance No increased pain;Patient tolerated treatment well   Behavior During Therapy [facility] for tasks assessed/performed      Past Medical History  Diagnosis Date  . Depression   . Anxiety   . Hyperlipidemia   . GERD (gastroesophageal reflux disease)   . Tobacco abuse   . Smoker 05/10/2015  . Degenerative arthritis of knee, bilateral 06/16/2015  . Pneumonia     hx of this several times  . Heart murmur     slight per pt- benign   . Hypertension     hx of tachycardia   . History of tachycardia   . Thyroid disease     hyperthyroidism as a teenager - received some injections age 43 that corrected this   . Constipation     uses docusate and fiber  . Diabetes ([place])     diet managed-type 11  . Tachycardia   . Shortness of breath dyspnea     with exertion  . Headache   . 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 Tonsillectomy      4 yoa  . Ovarian cyst removal  1972  . Abdominal hysterectomy  1993  . Wrist fracture surgery  11/2008  . Esophageal dilation      x 3  . Colonoscopy      30 years ago was normal per pt.   . Fractured leg Right   . Salivary gland surgery    . Cholecystectomy    . Back surgery    . Total knee arthroplasty Left 12/24/2015    Procedure: LEFT TOTAL KNEE ARTHROPLASTY;  Surgeon: [name], MD;  Location: [place];   Service: Orthopedics;  Laterality: Left;    There were no vitals filed for this visit.      Subjective Assessment - 06/04/16 1550    Subjective pt states that her R hip feels better today. States that she was able to vacuum 3 rooms yesterday without stopping.   Pertinent History hx of R knee OA. hx of depression    Diagnostic tests MRI- stenosis    Patient Stated Goals improve back pain    Currently in Pain? Yes   Pain Score 2    Pain Location Hip   Pain Orientation Right     THEREX bil bridging with diaphragmatic breathing x 15 single leg bridge on RLE with diaphragmatic breathing 2x5; min cues for proper exercise technique  fwd heel taps on RLE x 10; pt required RTB around R knee to cue to decrease knee valgus bil resisted lateral walking 75fx3 laps each; CGA; mod tactile cues to decrease pelvic rotation  MANUAL trigger point ischemic release and cross friction massage to proximal and piriformis attachment         PT Education -  06/04/16 1611    Education provided Yes   Education Details exercise technique, weight shifting   Person(s) Educated Patient   Methods Explanation   Comprehension Verbalized understanding             PT Long Term Goals - 05/12/16 1723    PT LONG TERM GOAL #1   Title Pt will demonstrate proper sitting posture with lumbar roll in place independently    Time 8   Period Weeks   Status New   PT LONG TERM GOAL #2   Title pt will be independent and compliant with HEP for pain relief and strengthening.    Time 8   Period Weeks   Status New   PT LONG TERM GOAL #3   Title pt will demonstrate proper mechanics of common household chores to reduce pain and increase function with pain <3/10   Time 8   Period Weeks   Status New   PT LONG TERM GOAL #4   Title pt will sore less than 30% disability on ODI for improved function    Time 8   Period Weeks   Status New               Plan - 06/04/16 1611    Clinical Impression Statement  pt continues to make progress as she presents to therapy today with decreased pain and subjective reports of improved function at home. pt continues with increased soft tissue restrictions of R piriformis and decreased strength in R hip musculature. pt R knee pain is limiting factor in strengthening exercises. pt needs continued skilled PT interventoin to address remaining deficits to improve overall function.    Rehab Potential Good   Clinical Impairments Affecting Rehab Potential co-morbidities, tobacco use, little home support   PT Frequency 2x / week   PT Duration 8 weeks   PT Treatment/Interventions ADLs/Self Care Home Management;Aquatic Therapy;Cryotherapy;Electrical Stimulation;Moist Heat;Traction;Patient/family education;Neuromuscular re-education;Therapeutic exercise;Therapeutic activities;Functional mobility training;Gait training;Manual techniques;Passive range of motion;Dry needling   PT Next Visit Plan MET for SIJ, manual, hip strengthening   Consulted and Agree with Plan of Care Patient      Patient will benefit from skilled therapeutic intervention in order to improve the following deficits and impairments:  Decreased strength, Abnormal gait, Increased muscle spasms, Improper body mechanics, Impaired flexibility, Postural dysfunction, Decreased range of motion, Pain, Hypomobility  Visit Diagnosis: Lumbago with sciatica, right side     Problem List Patient Active Problem List   Diagnosis Date Noted  . Tension headache 02/18/2016  . Degenerative arthritis of left knee 12/24/2015  . Left facial pain 10/29/2015  . Memory loss 10/29/2015  . Osteoarthritis Knees, bilateral, moderate 06/16/2015  . Smoker 05/10/2015  . Solitary pulmonary nodule 03/28/2013  . Cardiac murmur 03/15/2012  . Hypertension   . UNSPECIFIED VITAMIN D DEFICIENCY 05/29/2009  . Pure hypercholesterolemia 12/07/2007  . Major depressive disorder, recurrent episode, in partial remission ([place]) 12/07/2007  .  Diabetes mellitus type 2, controlled ([place]) 08/10/2007    Geraldine Solar, SPT This entire session was performed under direct supervision and direction of a licensed therapist/therapist assistant . I have personally read, edited and approve of the note as written. Gorden Harms. Tortorici, PT, DPT [phone]  Tortorici,Ashley 06/04/2016, 4:52 PM  [place] MAIN [facility] SERVICES [address] [place], [place], [address] Phone: [phone]   Fax:  [phone]  Name: LAUREY SALSER MRN: [id number] Date of Birth: 01-27-1951

## 2016-06-08 ENCOUNTER — Other Ambulatory Visit: Payer: Self-pay

## 2016-06-08 MED ORDER — CLONAZEPAM 0.5 MG PO TABS: 0.5000 mg | ORAL_TABLET | Freq: Two times a day (BID) | ORAL | Status: DC | PRN

## 2016-06-08 NOTE — Telephone Encounter (Signed)
Ok to ref, #30, 3 ref

## 2016-06-08 NOTE — Telephone Encounter (Signed)
Pt left vm requesting refill clonazepam to [facility] [address]. Last refilled # 30 x 2 on 11/09/15. Pt last seen f/u 01/23/16. Pt is out of med. Pt request cb when refilled.

## 2016-06-08 NOTE — Telephone Encounter (Signed)
Clonazepam called into [facility]

## 2016-06-10 ENCOUNTER — Other Ambulatory Visit: Payer: Self-pay | Admitting: Family Medicine

## 2016-06-10 ENCOUNTER — Other Ambulatory Visit: Payer: Self-pay | Admitting: Nurse Practitioner

## 2016-06-10 ENCOUNTER — Ambulatory Visit: Payer: Medicare Other | Attending: Orthopedic Surgery

## 2016-06-10 DIAGNOSIS — R2681 Unsteadiness on feet: Secondary | ICD-10-CM | POA: Insufficient documentation

## 2016-06-10 DIAGNOSIS — R269 Unspecified abnormalities of gait and mobility: Secondary | ICD-10-CM | POA: Diagnosis not present

## 2016-06-10 DIAGNOSIS — M5441 Lumbago with sciatica, right side: Secondary | ICD-10-CM | POA: Diagnosis not present

## 2016-06-10 NOTE — Telephone Encounter (Signed)
Last office visit 01/23/2016.  Last Lipid Panel 05/03/2014.  Refill?

## 2016-06-10 NOTE — Therapy (Signed)
[place] MAIN [facility] SERVICES [address] [place], [place], [address] Phone: [phone]   Fax:  [phone]  Physical Therapy Treatment  Patient Details  Name: Amanda [name] MRN: [id number] Date of Birth: [birth year] Referring Provider: Dr. Marlou Sa  Encounter Date: 06/10/2016      PT End of Session - 06/10/16 1700    Visit Number 8   Number of Visits 17   Date for PT Re-Evaluation 06/09/16   Authorization Type 8/10   PT Start Time 1520   PT Stop Time 1603   PT Time Calculation (min) 43 min   Activity Tolerance No increased pain;Patient tolerated treatment well   Behavior During Therapy [facility] for tasks assessed/performed      Past Medical History  Diagnosis Date  . Depression   . Anxiety   . Hyperlipidemia   . GERD (gastroesophageal reflux disease)   . Tobacco abuse   . Smoker 05/10/2015  . Degenerative arthritis of knee, bilateral 06/16/2015  . Pneumonia     hx of this several times  . Heart murmur     slight per pt- benign   . Hypertension     hx of tachycardia   . History of tachycardia   . Thyroid disease     hyperthyroidism as a teenager - received some injections age 62 that corrected this   . Constipation     uses docusate and fiber  . Diabetes ([place])     diet managed-type 11  . Tachycardia   . Shortness of breath dyspnea     with exertion  . Headache   . 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 Tonsillectomy      4 yoa  . Ovarian cyst removal  1972  . Abdominal hysterectomy  1993  . Wrist fracture surgery  11/2008  . Esophageal dilation      x 3  . Colonoscopy      30 years ago was normal per pt.   . Fractured leg Right   . Salivary gland surgery    . Cholecystectomy    . Back surgery    . Total knee arthroplasty Left 12/24/2015    Procedure: LEFT TOTAL KNEE ARTHROPLASTY;  Surgeon: [name], MD;  Location: [place];   Service: Orthopedics;  Laterality: Left;    There were no vitals filed for this visit.      Subjective Assessment - 06/10/16 1658    Subjective pt reports that she had increased hip pain over the weekend after performing yard work at home.   Pertinent History hx of R knee OA. hx of depression    Diagnostic tests MRI- stenosis    Patient Stated Goals improve back pain    Currently in Pain? Yes   Pain Score 5    Pain Location Hip   Pain Orientation Right   Pain Descriptors / Indicators Aching     THEREX single leg bridging with diaphragmatic breathing 2x5; limited by R knee pain fwd/retro monster walks with RTB, 16f x 5 laps fwd, retro, bil side stepping resisted walking with 10# Pt required CGA throughout all exercises. Pt required min verbal and tactile cues throughout exercises for proper technique.  MANUAL Deep cross friction and trigger point ischemic release of R piriformis, glute med/max      PT Education - 06/10/16 1700    Education provided Yes  Education Details exercise technique, continue HEP   Person(s) Educated Patient   Methods Explanation   Comprehension Verbalized understanding             PT Long Term Goals - 05/12/16 1723    PT LONG TERM GOAL #1   Title Pt will demonstrate proper sitting posture with lumbar roll in place independently    Time 8   Period Weeks   Status New   PT LONG TERM GOAL #2   Title pt will be independent and compliant with HEP for pain relief and strengthening.    Time 8   Period Weeks   Status New   PT LONG TERM GOAL #3   Title pt will demonstrate proper mechanics of common household chores to reduce pain and increase function with pain <3/10   Time 8   Period Weeks   Status New   PT LONG TERM GOAL #4   Title pt will sore less than 30% disability on ODI for improved function    Time 8   Period Weeks   Status New               Plan - 06/10/16 1700    Clinical Impression Statement pt limited in hip  strengthening exercises today due to R knee pain, however she was able to tolerate increased resistance with resisted walking. pt continues with soft tissue restrictions of R piriformis, glute med/max and core weakness. pt needs continued skilled PT intervention to address remaining deficits in order to maximize overall function.   Rehab Potential Good   Clinical Impairments Affecting Rehab Potential co-morbidities, tobacco use, little home support   PT Frequency 2x / week   PT Duration 8 weeks   PT Treatment/Interventions ADLs/Self Care Home Management;Aquatic Therapy;Cryotherapy;Electrical Stimulation;Moist Heat;Traction;Patient/family education;Neuromuscular re-education;Therapeutic exercise;Therapeutic activities;Functional mobility training;Gait training;Manual techniques;Passive range of motion;Dry needling   PT Next Visit Plan MET for SIJ, manual, hip strengthening   Consulted and Agree with Plan of Care Patient      Patient will benefit from skilled therapeutic intervention in order to improve the following deficits and impairments:  Decreased strength, Abnormal gait, Increased muscle spasms, Improper body mechanics, Impaired flexibility, Postural dysfunction, Decreased range of motion, Pain, Hypomobility  Visit Diagnosis: Lumbago with sciatica, right side     Problem List Patient Active Problem List   Diagnosis Date Noted  . Tension headache 02/18/2016  . Degenerative arthritis of left knee 12/24/2015  . Left facial pain 10/29/2015  . Memory loss 10/29/2015  . Osteoarthritis Knees, bilateral, moderate 06/16/2015  . Smoker 05/10/2015  . Solitary pulmonary nodule 03/28/2013  . Cardiac murmur 03/15/2012  . Hypertension   . UNSPECIFIED VITAMIN D DEFICIENCY 05/29/2009  . Pure hypercholesterolemia 12/07/2007  . Major depressive disorder, recurrent episode, in partial remission ([place]) 12/07/2007  . Diabetes mellitus type 2, controlled ([place]) 08/10/2007   Geraldine Solar, SPT This  entire session was performed under direct supervision and direction of a licensed therapist/therapist assistant . I have personally read, edited and approve of the note as written. Gorden Harms. Tortorici, PT, DPT [phone]   Tortorici,Ashley 06/11/2016, 10:44 AM  [place] MAIN [facility] SERVICES [address] [place], [place], [address] Phone: [phone]   Fax:  [phone]  Name: Amanda [name] MRN: [id number] Date of Birth: 1951/03/31

## 2016-06-15 ENCOUNTER — Ambulatory Visit: Payer: Medicare Other

## 2016-06-17 ENCOUNTER — Other Ambulatory Visit: Payer: Self-pay | Admitting: Nurse Practitioner

## 2016-06-17 ENCOUNTER — Ambulatory Visit: Payer: Medicare Other | Admitting: Physical Therapy

## 2016-06-17 DIAGNOSIS — R2681 Unsteadiness on feet: Secondary | ICD-10-CM | POA: Diagnosis not present

## 2016-06-17 DIAGNOSIS — R269 Unspecified abnormalities of gait and mobility: Secondary | ICD-10-CM | POA: Diagnosis not present

## 2016-06-17 DIAGNOSIS — M5441 Lumbago with sciatica, right side: Secondary | ICD-10-CM | POA: Diagnosis not present

## 2016-06-17 NOTE — Therapy (Deleted)
[place] MAIN [facility] SERVICES [address] [place], [place], [address] Phone: [phone]   Fax:  [phone]  Physical Therapy Treatment  Patient Details  Name: Amanda [name] MRN: [id number] Date of Birth: 11/15/1951 Referring Provider: Dr. Marlou Sa  Encounter Date: 06/17/2016      PT End of Session - 06/17/16 1718    Visit Number 9   Number of Visits 17   Date for PT Re-Evaluation 06/09/16   Authorization Type 9/10   PT Start Time 1650   PT Stop Time 1730   PT Time Calculation (min) 40 min   Activity Tolerance No increased pain;Patient tolerated treatment well   Behavior During Therapy [facility] for tasks assessed/performed      Past Medical History  Diagnosis Date  . Depression   . Anxiety   . Hyperlipidemia   . GERD (gastroesophageal reflux disease)   . Tobacco abuse   . Smoker 05/10/2015  . Degenerative arthritis of knee, bilateral 06/16/2015  . Pneumonia     hx of this several times  . Heart murmur     slight per pt- benign   . Hypertension     hx of tachycardia   . History of tachycardia   . Thyroid disease     hyperthyroidism as a teenager - received some injections age 43 that corrected this   . Constipation     uses docusate and fiber  . Diabetes ([place])     diet managed-type 11  . Tachycardia   . Shortness of breath dyspnea     with exertion  . Headache   . 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 Tonsillectomy      4 yoa  . Ovarian cyst removal  1972  . Abdominal hysterectomy  1993  . Wrist fracture surgery  11/2008  . Esophageal dilation      x 3  . Colonoscopy      30 years ago was normal per pt.   . Fractured leg Right   . Salivary gland surgery    . Cholecystectomy    . Back surgery    . Total knee arthroplasty Left 12/24/2015    Procedure: LEFT TOTAL KNEE ARTHROPLASTY;  Surgeon: [name], MD;  Location: [place];   Service: Orthopedics;  Laterality: Left;    There were no vitals filed for this visit.  THEREX Reassessed lumbar AROM: WNL, mod limitation with extension, pain with extension and R lateral flexion bil single leg bridging with airex pad under foot (to decrease compression forces and decrease knee pain) and diaphragmatic breathing, 2x10 each; min verbal cues for technique resisted walking 4 way with 10#, x 3 laps each; CGA, slight unsteadiness during retro  MANUAL cross friction and trigger point to proximal glute max and piriformis x 12 min       PT Long Term Goals - 06/17/16 1655    PT LONG TERM GOAL #1   Title Pt will demonstrate proper sitting posture with lumbar roll in place independently    Time 8   Period Weeks   Status Achieved   PT LONG TERM GOAL #2   Title pt will be independent and compliant with HEP for pain relief and strengthening.    Time 8   Period Weeks   Status Partially Met   PT LONG TERM GOAL #3   Title pt will demonstrate  proper mechanics of common household chores to reduce pain and increase function with pain <3/10   Baseline 3.5-4/10 after 45 mins; has to stop   Time 8   Period Weeks   Status On-going   PT LONG TERM GOAL #4   Title pt will sore less than 30% disability on ODI for improved function    Time 8   Period Weeks   Status On-going               Plan - 06/17/16 1720    Clinical Impression Statement SPT performed progress note on pt this date. pt has met 1 goal and partially met the others. pt still has pain with ROM and deficits in hip and core strength. She states that she can perform household chores for 45 mins before needing to rest due to increased pain. She is partially compliant with HEP because some of the exercises hurt her R knee. pt educated on how to modify the exercises in order to minimize knee pain. Her R knee joint pain is the main limiting factor for strengthening exercises during therapy. pt still presents with soft  tissue restrictions and tenderness to palpation of R glute max and piriformis. pt needs continued skilled PT intervention to improve strength, decrease pain, and improve overall function.   Rehab Potential Good   Clinical Impairments Affecting Rehab Potential co-morbidities, tobacco use, little home support   PT Frequency 2x / week   PT Duration 8 weeks   PT Treatment/Interventions ADLs/Self Care Home Management;Aquatic Therapy;Cryotherapy;Electrical Stimulation;Moist Heat;Traction;Patient/family education;Neuromuscular re-education;Therapeutic exercise;Therapeutic activities;Functional mobility training;Gait training;Manual techniques;Passive range of motion;Dry needling   PT Next Visit Plan MET for SIJ, manual, hip strengthening   Consulted and Agree with Plan of Care Patient      Patient will benefit from skilled therapeutic intervention in order to improve the following deficits and impairments:  Decreased strength, Abnormal gait, Increased muscle spasms, Improper body mechanics, Impaired flexibility, Postural dysfunction, Decreased range of motion, Pain, Hypomobility  Visit Diagnosis: Lumbago with sciatica, right side     Problem List Patient Active Problem List   Diagnosis Date Noted  . Tension headache 02/18/2016  . Degenerative arthritis of left knee 12/24/2015  . Left facial pain 10/29/2015  . Memory loss 10/29/2015  . Osteoarthritis Knees, bilateral, moderate 06/16/2015  . Smoker 05/10/2015  . Solitary pulmonary nodule 03/28/2013  . Cardiac murmur 03/15/2012  . Hypertension   . UNSPECIFIED VITAMIN D DEFICIENCY 05/29/2009  . Pure hypercholesterolemia 12/07/2007  . Major depressive disorder, recurrent episode, in partial remission ([place]) 12/07/2007  . Diabetes mellitus type 2, controlled ([place]) 08/10/2007   Geraldine Solar, SPT  Geraldine Solar 06/17/2016, 5:37 PM  [place] MAIN [facility] SERVICES [address] [place], [place],  [address] Phone: [phone]   Fax:  [phone]  Name: Amanda [name] MRN: [id number] Date of Birth: [birth year]

## 2016-06-17 NOTE — Therapy (Addendum)
[place] MAIN [facility] SERVICES [address] [place], [place], [address] Phone: [phone]   Fax:  [phone]  Physical Therapy Treatment/Progress note  Patient Details  Name: Amanda [name] MRN: [id number] Date of Birth: January 21, 1951 Referring Provider: Dr. Marlou Sa  Encounter Date: 06/17/2016      PT End of Session - 06/17/16 1718    Visit Number 9   Number of Visits 33   Date for PT Re-Evaluation 07/15/16   Authorization Type 9/10   PT Start Time 1650   PT Stop Time 1730   PT Time Calculation (min) 40 min   Activity Tolerance No increased pain;Patient tolerated treatment well   Behavior During Therapy [facility] for tasks assessed/performed      Past Medical History  Diagnosis Date  . Depression   . Anxiety   . Hyperlipidemia   . GERD (gastroesophageal reflux disease)   . Tobacco abuse   . Smoker 05/10/2015  . Degenerative arthritis of knee, bilateral 06/16/2015  . Pneumonia     hx of this several times  . Heart murmur     slight per pt- benign   . Hypertension     hx of tachycardia   . History of tachycardia   . Thyroid disease     hyperthyroidism as a teenager - received some injections age 21 that corrected this   . Constipation     uses docusate and fiber  . Diabetes ([place])     diet managed-type 11  . Tachycardia   . Shortness of breath dyspnea     with exertion  . Headache   . 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 Tonsillectomy      4 yoa  . Ovarian cyst removal  1972  . Abdominal hysterectomy  1993  . Wrist fracture surgery  11/2008  . Esophageal dilation      x 3  . Colonoscopy      30 years ago was normal per pt.   . Fractured leg Right   . Salivary gland surgery    . Cholecystectomy    . Back surgery    . Total knee arthroplasty Left 12/24/2015    Procedure: LEFT TOTAL KNEE ARTHROPLASTY;  Surgeon: [name], MD;   Location: [place];  Service: Orthopedics;  Laterality: Left;    There were no vitals filed for this visit.      Subjective Assessment - 06/17/16 1803    Subjective pt states she was sick and forgot about the appointment Monday. States she is still having pain in her R hip area but it did not keep her from perfoming ADLs at home over the weekend.   Pertinent History hx of R knee OA. hx of depression    Diagnostic tests MRI- stenosis    Patient Stated Goals improve back pain    Currently in Pain? Yes   Pain Score 3    Pain Location Hip   Pain Orientation Right   Pain Descriptors / Indicators Sore       THEREX Reassessed lumbar AROM: WNL, mod limitation with extension, pain with extension and R lateral flexion bil single leg bridging with airex pad under foot (to decrease compression forces and decrease knee pain) and diaphragmatic breathing, 2x10 each; min verbal cues for technique resisted walking 4 way with 10#, x 3 laps each; CGA, slight unsteadiness during retro  MANUAL cross friction and trigger point to proximal glute max and piriformis x 12 min        PT Education - 06/17/16 1805    Education provided Yes   Education Details exercise, HEP modifications   Person(s) Educated Patient   Methods Explanation   Comprehension Verbalized understanding             PT Long Term Goals - 06/17/16 1655    PT LONG TERM GOAL #1   Title Pt will demonstrate proper sitting posture with lumbar roll in place independently    Time 8   Period Weeks   Status Achieved   PT LONG TERM GOAL #2   Title pt will be independent and compliant with HEP for pain relief and strengthening.    Time 8   Period Weeks   Status Partially Met   PT LONG TERM GOAL #3   Title pt will demonstrate proper mechanics of common household chores to reduce pain and increase function with pain <3/10   Baseline 3.5-4/10 after 45 mins; has to stop   Time 8   Period Weeks   Status On-going   PT LONG TERM  GOAL #4   Title pt will sore less than 30% disability on ODI for improved function    Time 8   Period Weeks   Status On-going               Plan - 06/17/16 1720    Clinical Impression Statement SPT performed progress note on pt this date. pt has met 1 goal and partially met the others. pt still has pain with ROM and deficits in hip and core strength. She states that she can perform household chores for 45 mins before needing to rest due to increased pain. She is partially compliant with HEP because some of the exercises hurt her R knee. pt educated on how to modify the exercises in order to minimize knee pain. Her R knee joint pain is the main limiting factor for strengthening exercises during therapy. pt still presents with soft tissue restrictions and tenderness to palpation of R glute max and piriformis. pt needs continued skilled PT intervention to improve strength, decrease pain, and improve overall function.   Rehab Potential Good   Clinical Impairments Affecting Rehab Potential co-morbidities, tobacco use, little home support   PT Frequency 2x / week   PT Duration 8 weeks   PT Treatment/Interventions ADLs/Self Care Home Management;Aquatic Therapy;Cryotherapy;Electrical Stimulation;Moist Heat;Traction;Patient/family education;Neuromuscular re-education;Therapeutic exercise;Therapeutic activities;Functional mobility training;Gait training;Manual techniques;Passive range of motion;Dry needling   PT Next Visit Plan MET for SIJ, manual, hip strengthening   Consulted and Agree with Plan of Care Patient      Patient will benefit from skilled therapeutic intervention in order to improve the following deficits and impairments:  Decreased strength, Abnormal gait, Increased muscle spasms, Improper body mechanics, Impaired flexibility, Postural dysfunction, Decreased range of motion, Pain, Hypomobility  Visit Diagnosis: Lumbago with sciatica, right side     Problem List Patient Active  Problem List   Diagnosis Date Noted  . Tension headache 02/18/2016  . Degenerative arthritis of left knee 12/24/2015  . Left facial pain 10/29/2015  . Memory loss 10/29/2015  . Osteoarthritis Knees, bilateral, moderate 06/16/2015  . Smoker 05/10/2015  . Solitary pulmonary nodule 03/28/2013  . Cardiac murmur 03/15/2012  . Hypertension   . UNSPECIFIED VITAMIN D DEFICIENCY 05/29/2009  . Pure hypercholesterolemia 12/07/2007  . Major depressive disorder, recurrent episode, in partial remission ([place]) 12/07/2007  .  Diabetes mellitus type 2, controlled ([place]) 08/10/2007   Geraldine Solar, SPT  Geraldine Solar 06/17/2016, 6:13 PM  This entire session was performed under direct supervision and direction of a licensed therapist/therapist assistant . I have personally read, edited and approve of the note as written.   Mindy Shiela Mayer, PT, DPT  06/30/16, 2:23 PM [place] MAIN [facility] SERVICES [address] [place], [place], [address] Phone: [phone]   Fax:  [phone]  Name: Amanda [name] MRN: [id number] Date of Birth: 1951/01/19

## 2016-06-18 ENCOUNTER — Telehealth: Payer: Self-pay | Admitting: Cardiovascular Disease

## 2016-06-18 ENCOUNTER — Other Ambulatory Visit: Payer: Self-pay | Admitting: *Deleted

## 2016-06-18 MED ORDER — METOPROLOL TARTRATE 25 MG PO TABS: 12.5000 mg | ORAL_TABLET | Freq: Two times a day (BID) | ORAL | Status: DC

## 2016-06-18 NOTE — Telephone Encounter (Signed)
Requested Prescriptions   Signed Prescriptions Disp Refills  . metoprolol tartrate (LOPRESSOR) 25 MG tablet 90 tablet 0    Sig: Take 0.5 tablets (12.5 mg total) by mouth 2 (two) times daily.    Authorizing Provider: Kathlyn Sacramento A    Ordering User: Britt Bottom

## 2016-06-18 NOTE — Telephone Encounter (Signed)
°*  STAT* If patient is at the pharmacy, call can be transferred to refill team.   1. Which medications need to be refilled? (please list name of each medication and dose if known) metoprolol tartrate  2. Which pharmacy/location (including street and city if local pharmacy) is medication to be sent to? [facility] [address]  3. Do they need a 30 day or 90 day supply? 90 day

## 2016-06-22 ENCOUNTER — Ambulatory Visit: Payer: Medicare Other | Admitting: Physical Therapy

## 2016-06-22 DIAGNOSIS — R2681 Unsteadiness on feet: Secondary | ICD-10-CM

## 2016-06-22 DIAGNOSIS — M5441 Lumbago with sciatica, right side: Secondary | ICD-10-CM

## 2016-06-22 DIAGNOSIS — R269 Unspecified abnormalities of gait and mobility: Secondary | ICD-10-CM

## 2016-06-22 NOTE — Therapy (Signed)
[place] MAIN [facility] SERVICES [address] [place], [place], [address] Phone: [phone]   Fax:  [phone]  Physical Therapy Treatment  Patient Details  Name: Amanda [name] MRN: [id number] Date of Birth: January 27, 1951 Referring Provider: Dr. Marlou Sa  Encounter Date: 06/22/2016      PT End of Session - 06/22/16 1645    Visit Number 10   Number of Visits 17   Date for PT Re-Evaluation 07/15/16   Authorization Type 1/10   PT Start Time 1615   PT Stop Time 1700   PT Time Calculation (min) 45 min   Activity Tolerance No increased pain;Patient tolerated treatment well   Behavior During Therapy [facility] for tasks assessed/performed      Past Medical History  Diagnosis Date  . Depression   . Anxiety   . Hyperlipidemia   . GERD (gastroesophageal reflux disease)   . Tobacco abuse   . Smoker 05/10/2015  . Degenerative arthritis of knee, bilateral 06/16/2015  . Pneumonia     hx of this several times  . Heart murmur     slight per pt- benign   . Hypertension     hx of tachycardia   . History of tachycardia   . Thyroid disease     hyperthyroidism as a teenager - received some injections age 37 that corrected this   . Constipation     uses docusate and fiber  . Diabetes ([place])     diet managed-type 11  . Tachycardia   . Shortness of breath dyspnea     with exertion  . Headache   . 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 Tonsillectomy      4 yoa  . Ovarian cyst removal  1972  . Abdominal hysterectomy  1993  . Wrist fracture surgery  11/2008  . Esophageal dilation      x 3  . Colonoscopy      30 years ago was normal per pt.   . Fractured leg Right   . Salivary gland surgery    . Cholecystectomy    . Back surgery    . Total knee arthroplasty Left 12/24/2015    Procedure: LEFT TOTAL KNEE ARTHROPLASTY;  Surgeon: [name], MD;  Location: [place];   Service: Orthopedics;  Laterality: Left;    There were no vitals filed for this visit.      Subjective Assessment - 06/22/16 1642    Subjective Pt reports she is doing about the same. She continues to have R lower back and buttock pain.   Pertinent History hx of R knee OA. hx of depression    Diagnostic tests MRI- stenosis    Patient Stated Goals improve back pain    Currently in Pain? Yes   Pain Score 4   R lower back and buttock   Pain Type Chronic pain      MANUAL:  R gluteal cross friction massage with rolling stick and trigger point release with pt in prone position x 30 minutes  Monitored pt tolerance throughout. Upper glute med and max, and piriformis trigger points.  Improved tenderness and soft tissue tightness after manual treatment.  THEREX:  REIS 2x10 against table with improved lumbar ROM and reduced stiffness Supine modified piriformis stretch with hand under knee for comfort 5 x 30 sec hold Supine HS stretch with towel and added foot pumps  2x20   Reduced lower back/ upper buttock pain and stiffness after stretching.  Cues for proper technique of exercises to target specific muscles.                             PT Education - 06/22/16 1644    Education provided Yes   Education Details modified piriformis stretch, posture and lifting mechanics   Person(s) Educated Patient   Methods Explanation   Comprehension Verbalized understanding             PT Long Term Goals - 06/17/16 1655    PT LONG TERM GOAL #1   Title Pt will demonstrate proper sitting posture with lumbar roll in place independently    Time 8   Period Weeks   Status Achieved   PT LONG TERM GOAL #2   Title pt will be independent and compliant with HEP for pain relief and strengthening.    Time 8   Period Weeks   Status Partially Met   PT LONG TERM GOAL #3   Title pt will demonstrate proper mechanics of common household chores to reduce pain and increase  function with pain <3/10   Baseline 3.5-4/10 after 45 mins; has to stop   Time 8   Period Weeks   Status On-going   PT LONG TERM GOAL #4   Title pt will sore less than 30% disability on ODI for improved function    Time 8   Period Weeks   Status On-going               Plan - 06/22/16 1645    Clinical Impression Statement Pt demonstrated tenderness to R lower back/upper gluteal muscles. Post manual treatment, pt reports reduced pain and tenderness. With modified piriformis stretch, pt able to tolerate without R knee irritation.  She says that vaccuming and other twisting tasks irritate her the most.  She will benefit from practicing correct posture and lifting mechanics, continued manual treatment, stretching, and strengthening.    Rehab Potential Good   Clinical Impairments Affecting Rehab Potential co-morbidities, tobacco use, little home support   PT Frequency 2x / week   PT Duration 8 weeks   PT Treatment/Interventions ADLs/Self Care Home Management;Aquatic Therapy;Cryotherapy;Electrical Stimulation;Moist Heat;Traction;Patient/family education;Neuromuscular re-education;Therapeutic exercise;Therapeutic activities;Functional mobility training;Gait training;Manual techniques;Passive range of motion;Dry needling   PT Next Visit Plan MET for SIJ, manual, hip strengthening   Consulted and Agree with Plan of Care Patient      Patient will benefit from skilled therapeutic intervention in order to improve the following deficits and impairments:  Decreased strength, Abnormal gait, Increased muscle spasms, Improper body mechanics, Impaired flexibility, Postural dysfunction, Decreased range of motion, Pain, Hypomobility  Visit Diagnosis: Lumbago with sciatica, right side  Unsteadiness on feet  Abnormality of gait     Problem List Patient Active Problem List   Diagnosis Date Noted  . Tension headache 02/18/2016  . Degenerative arthritis of left knee 12/24/2015  . Left facial  pain 10/29/2015  . Memory loss 10/29/2015  . Osteoarthritis Knees, bilateral, moderate 06/16/2015  . Smoker 05/10/2015  . Solitary pulmonary nodule 03/28/2013  . Cardiac murmur 03/15/2012  . Hypertension   . UNSPECIFIED VITAMIN D DEFICIENCY 05/29/2009  . Pure hypercholesterolemia 12/07/2007  . Major depressive disorder, recurrent episode, in partial remission ([place]) 12/07/2007  . Diabetes mellitus type 2, controlled ([place]) 08/10/2007    Sydelle Sherfield Shiela Mayer, PT, DPT  06/22/2016, 5:28 PM [place]  [facility] MAIN [facility] SERVICES [place], [place], [address] Phone: [phone]   Fax:  [phone]  Name: YAZMIN LOCHER MRN: [id number] Date of Birth: Oct 17, 1951

## 2016-06-24 ENCOUNTER — Ambulatory Visit: Payer: Medicare Other | Admitting: Physical Therapy

## 2016-06-24 DIAGNOSIS — R2681 Unsteadiness on feet: Secondary | ICD-10-CM | POA: Diagnosis not present

## 2016-06-24 DIAGNOSIS — M5441 Lumbago with sciatica, right side: Secondary | ICD-10-CM | POA: Diagnosis not present

## 2016-06-24 DIAGNOSIS — R269 Unspecified abnormalities of gait and mobility: Secondary | ICD-10-CM | POA: Diagnosis not present

## 2016-06-24 NOTE — Therapy (Signed)
[place] MAIN [facility] SERVICES [address] [place], [place], [address] Phone: [phone]   Fax:  [phone]  Physical Therapy Treatment  Patient Details  Name: Amanda [name] MRN: [id number] Date of Birth: 11-03-1951 Referring Provider: Dr. Marlou Sa  Encounter Date: 06/24/2016      PT End of Session - 06/24/16 1630    Visit Number 11   Number of Visits 17   Date for PT Re-Evaluation 07/15/16   Authorization Type 2/10   PT Start Time 1600   PT Stop Time 1645   PT Time Calculation (min) 45 min   Activity Tolerance No increased pain;Patient tolerated treatment well   Behavior During Therapy [facility] for tasks assessed/performed      Past Medical History  Diagnosis Date  . Depression   . Anxiety   . Hyperlipidemia   . GERD (gastroesophageal reflux disease)   . Tobacco abuse   . Smoker 05/10/2015  . Degenerative arthritis of knee, bilateral 06/16/2015  . Pneumonia     hx of this several times  . Heart murmur     slight per pt- benign   . Hypertension     hx of tachycardia   . History of tachycardia   . Thyroid disease     hyperthyroidism as a teenager - received some injections age 51 that corrected this   . Constipation     uses docusate and fiber  . Diabetes ([place])     diet managed-type 11  . Tachycardia   . Shortness of breath dyspnea     with exertion  . Headache   . 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 Tonsillectomy      4 yoa  . Ovarian cyst removal  1972  . Abdominal hysterectomy  1993  . Wrist fracture surgery  11/2008  . Esophageal dilation      x 3  . Colonoscopy      30 years ago was normal per pt.   . Fractured leg Right   . Salivary gland surgery    . Cholecystectomy    . Back surgery    . Total knee arthroplasty Left 12/24/2015    Procedure: LEFT TOTAL KNEE ARTHROPLASTY;  Surgeon: [name], MD;  Location: [place];   Service: Orthopedics;  Laterality: Left;    There were no vitals filed for this visit.      Subjective Assessment - 06/24/16 1628    Subjective pt states she is so-so today and felt a little sore after last treatment session.   Pertinent History hx of R knee OA. hx of depression    Diagnostic tests MRI- stenosis    Patient Stated Goals improve back pain    Currently in Pain? Yes   Pain Score 2    Pain Location Hip   Pain Orientation Right   Pain Descriptors / Indicators Aching        Manual: Trigger point ischemic release and efflurage with the stick to R distal glute max/med and ITB x23 minutes; pt reported feeling better following treatment  Therex:  Bil bridging with diaphragmatic breathing, 2x10; min verbal cues for proper breathing Bil single leg bridging with diaphragmatic breathing, 2x5 each; min cues for proper technique Bil sidelying hip abduction and clamshells with GTB, 2x10 each; min cues for proper exercise technique          PT  Education - 06/24/16 1629    Education provided Yes   Education Details diaphragmatic breathing,    Person(s) Educated Patient   Methods Explanation   Comprehension Verbalized understanding             PT Long Term Goals - 06/17/16 1655    PT LONG TERM GOAL #1   Title Pt will demonstrate proper sitting posture with lumbar roll in place independently    Time 8   Period Weeks   Status Achieved   PT LONG TERM GOAL #2   Title pt will be independent and compliant with HEP for pain relief and strengthening.    Time 8   Period Weeks   Status Partially Met   PT LONG TERM GOAL #3   Title pt will demonstrate proper mechanics of common household chores to reduce pain and increase function with pain <3/10   Baseline 3.5-4/10 after 45 mins; has to stop   Time 8   Period Weeks   Status On-going   PT LONG TERM GOAL #4   Title pt will sore less than 30% disability on ODI for improved function    Time 8   Period Weeks    Status On-going               Plan - 06/24/16 1635    Clinical Impression Statement Pt continues to have soft tissue restrictions of R glute max/med and also presented with increased R ITB tightness. Pt tolerated manual treatment and progressed hip strengtheing well and reported that her hip and lower back felt better at the end of the session. Pt still has deficits in core and LE strength and needs continued skilled PT to maximize overall function.   Rehab Potential Good   Clinical Impairments Affecting Rehab Potential co-morbidities, tobacco use, little home support   PT Frequency 2x / week   PT Duration 8 weeks   PT Treatment/Interventions ADLs/Self Care Home Management;Aquatic Therapy;Cryotherapy;Electrical Stimulation;Moist Heat;Traction;Patient/family education;Neuromuscular re-education;Therapeutic exercise;Therapeutic activities;Functional mobility training;Gait training;Manual techniques;Passive range of motion;Dry needling   PT Next Visit Plan MET for SIJ, manual, hip strengthening   Consulted and Agree with Plan of Care Patient      Patient will benefit from skilled therapeutic intervention in order to improve the following deficits and impairments:  Decreased strength, Abnormal gait, Increased muscle spasms, Improper body mechanics, Impaired flexibility, Postural dysfunction, Decreased range of motion, Pain, Hypomobility  Visit Diagnosis: Lumbago with sciatica, right side  Unsteadiness on feet     Problem List Patient Active Problem List   Diagnosis Date Noted  . Tension headache 02/18/2016  . Degenerative arthritis of left knee 12/24/2015  . Left facial pain 10/29/2015  . Memory loss 10/29/2015  . Osteoarthritis Knees, bilateral, moderate 06/16/2015  . Smoker 05/10/2015  . Solitary pulmonary nodule 03/28/2013  . Cardiac murmur 03/15/2012  . Hypertension   . UNSPECIFIED VITAMIN D DEFICIENCY 05/29/2009  . Pure hypercholesterolemia 12/07/2007  . Major  depressive disorder, recurrent episode, in partial remission ([place]) 12/07/2007  . Diabetes mellitus type 2, controlled ([place]) 08/10/2007   Geraldine Solar, SPT  Geraldine Solar 06/24/2016, 5:07 PM   This entire session was performed under direct supervision and direction of a licensed therapist/therapist assistant . I have personally read, edited and approve of the note as written.  Mindy Shiela Mayer, PT, DPT  06/24/2016, 6:04 PM [place] MAIN [facility] SERVICES [address] [place], [place], [address] Phone: [phone]   Fax:  [phone]  Name: AMERI CAHOON MRN: [id number] Date of Birth: 13-May-1951

## 2016-06-29 ENCOUNTER — Ambulatory Visit: Payer: Medicare Other | Admitting: Physical Therapy

## 2016-06-29 ENCOUNTER — Encounter: Payer: Self-pay | Admitting: Physical Therapy

## 2016-06-29 DIAGNOSIS — M5441 Lumbago with sciatica, right side: Secondary | ICD-10-CM | POA: Diagnosis not present

## 2016-06-29 DIAGNOSIS — R2681 Unsteadiness on feet: Secondary | ICD-10-CM | POA: Diagnosis not present

## 2016-06-29 DIAGNOSIS — R269 Unspecified abnormalities of gait and mobility: Secondary | ICD-10-CM

## 2016-06-29 NOTE — Therapy (Signed)
[place] MAIN [facility] SERVICES [address] [place], [place], [address] Phone: [phone]   Fax:  [phone]  Physical Therapy Treatment  Patient Details  Name: Amanda [name] MRN: [id number] Date of Birth: March 31, 1951 Referring Provider: Dr. Marlou Sa  Encounter Date: 06/29/2016      PT End of Session - 06/29/16 1704    Visit Number 12   Number of Visits 17   Date for PT Re-Evaluation 07/15/16   Authorization Type 3/10   PT Start Time 1605   PT Stop Time 9528   PT Time Calculation (min) 40 min   Activity Tolerance No increased pain;Patient tolerated treatment well   Behavior During Therapy [facility] for tasks assessed/performed      Past Medical History:  Diagnosis Date  . Anxiety   . Constipation    uses docusate and fiber  . Degenerative arthritis of knee, bilateral 06/16/2015  . Depression   . Diabetes ([place])    diet managed-type 11  . GERD (gastroesophageal reflux disease)   . Headache   . Heart murmur    slight per pt- benign   . History of tachycardia   . Hyperlipidemia   . Hypertension    hx of tachycardia   . 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 Pneumonia    hx of this several times  . Shortness of breath dyspnea    with exertion  . Smoker 05/10/2015  . Tachycardia   . Thyroid disease    hyperthyroidism as a teenager - received some injections age 31 that corrected this   . Tobacco abuse     Past Surgical History:  Procedure Laterality Date  . ABDOMINAL HYSTERECTOMY  1993  . BACK SURGERY    . CHOLECYSTECTOMY    . COLONOSCOPY     30 years ago was normal per pt.   . ESOPHAGEAL DILATION     x 3  . fractured leg Right   . OVARIAN CYST REMOVAL  1972  . SALIVARY GLAND SURGERY    . TONSILLECTOMY     4 yoa  . TOTAL KNEE ARTHROPLASTY Left 12/24/2015   Procedure: LEFT TOTAL KNEE ARTHROPLASTY;  Surgeon: [name], MD;  Location: [place];  Service: Orthopedics;  Laterality: Left;  .  WRIST FRACTURE SURGERY  11/2008    There were no vitals filed for this visit.      Subjective Assessment - 06/29/16 1702    Subjective pt reports that she vacuumed, mopped, and mulched last Friday which aggravated her LBP.    Pertinent History hx of R knee OA. hx of depression    Diagnostic tests MRI- stenosis    Patient Stated Goals improve back pain    Pain Score 4    Pain Location Back   Pain Orientation Right;Left   Pain Descriptors / Indicators Sore       Therex: DKTC 3x30sec; pt reported decreased LBP following stretches  Sidelying clamshells with 10 sec isometric hold at end range with RTB, 2x10; min verbal cues for proper exercise technique R piriformis MET throughout range, x 2 with 6 sec hold; pt reported feeling looser following; min verbal cues for proper technique REIS 2x10; decreased pain in lower back Simulated sweeping, vacuuming, and mulching with pt; pt demonstrated improper body mechanics throughout all as evidenced by increased trunk flexion and rotation; educated pt to step with the vacuum/broom/rake to decrease UE reach/trunk flexion which aggravate her symptoms; also educated  to continue REIS following those activities since she has an extension preference       PT Education - 06/29/16 1703    Education provided Yes   Education Details body mechanics with household chores   Person(s) Educated Patient   Methods Explanation;Demonstration   Comprehension Verbalized understanding             PT Long Term Goals - 06/17/16 1655      PT LONG TERM GOAL #1   Title Pt will demonstrate proper sitting posture with lumbar roll in place independently    Time 8   Period Weeks   Status Achieved     PT LONG TERM GOAL #2   Title pt will be independent and compliant with HEP for pain relief and strengthening.    Time 8   Period Weeks   Status Partially Met     PT LONG TERM GOAL #3   Title pt will demonstrate proper mechanics of common household chores to  reduce pain and increase function with pain <3/10   Baseline 3.5-4/10 after 45 mins; has to stop   Time 8   Period Weeks   Status On-going     PT LONG TERM GOAL #4   Title pt will sore less than 30% disability on ODI for improved function    Time 8   Period Weeks   Status On-going               Plan - 06/29/16 1704    Clinical Impression Statement Pt with increased back pain this session due to performing heavier household duties over the weekend. SPT and PT assessed pt's body mechanics during simulated household chores and corrected pt's posture and body mechanics accordingly. Pt performed REIS which decreased her back pain to 0/10 at the end of the session. Pt needs continued skilled PT intervention to address remaining deficits to maximize overall function and decrease pain.   Rehab Potential Good   Clinical Impairments Affecting Rehab Potential co-morbidities, tobacco use, little home support   PT Frequency 2x / week   PT Duration 8 weeks   PT Treatment/Interventions ADLs/Self Care Home Management;Aquatic Therapy;Cryotherapy;Electrical Stimulation;Moist Heat;Traction;Patient/family education;Neuromuscular re-education;Therapeutic exercise;Therapeutic activities;Functional mobility training;Gait training;Manual techniques;Passive range of motion;Dry needling   PT Next Visit Plan MET for SIJ, manual, hip strengthening   Consulted and Agree with Plan of Care Patient      Patient will benefit from skilled therapeutic intervention in order to improve the following deficits and impairments:  Decreased strength, Abnormal gait, Increased muscle spasms, Improper body mechanics, Impaired flexibility, Postural dysfunction, Decreased range of motion, Pain, Hypomobility  Visit Diagnosis: Lumbago with sciatica, right side  Unsteadiness on feet  Abnormality of gait     Problem List Patient Active Problem List   Diagnosis Date Noted  . Tension headache 02/18/2016  . Degenerative  arthritis of left knee 12/24/2015  . Left facial pain 10/29/2015  . Memory loss 10/29/2015  . Osteoarthritis Knees, bilateral, moderate 06/16/2015  . Smoker 05/10/2015  . Solitary pulmonary nodule 03/28/2013  . Cardiac murmur 03/15/2012  . Hypertension   . UNSPECIFIED VITAMIN D DEFICIENCY 05/29/2009  . Pure hypercholesterolemia 12/07/2007  . Major depressive disorder, recurrent episode, in partial remission (HCC) 12/07/2007  . Diabetes mellitus type 2, controlled (HCC) 08/10/2007    , SPT    06/29/2016, 5:11 PM  [place] [facility] MAIN REHAB SERVICES [address] [place], [place], [address] Phone: [phone]   Fax:  [phone]  Name:   Keni W Rosales MRN: [id number] Date of Birth: 10/10/1951    

## 2016-06-30 NOTE — Addendum Note (Signed)
Addended by: Boone Master on: 06/30/2016 02:23 PM   Modules accepted: Orders

## 2016-07-01 ENCOUNTER — Ambulatory Visit: Payer: Medicare Other

## 2016-07-01 DIAGNOSIS — R269 Unspecified abnormalities of gait and mobility: Secondary | ICD-10-CM

## 2016-07-01 DIAGNOSIS — R2681 Unsteadiness on feet: Secondary | ICD-10-CM | POA: Diagnosis not present

## 2016-07-01 DIAGNOSIS — M5441 Lumbago with sciatica, right side: Secondary | ICD-10-CM

## 2016-07-01 NOTE — Therapy (Signed)
[place] MAIN [facility] SERVICES [address] [place], [place], [address] Phone: [phone]   Fax:  [phone]  Physical Therapy Treatment  Patient Details  Name: Amanda [name] MRN: [id number] Date of Birth: 04-11-1951 Referring Provider: Dr. Marlou Sa  Encounter Date: 07/01/2016      PT End of Session - 07/01/16 1656    Visit Number 13   Number of Visits 17   Date for PT Re-Evaluation 07/15/16   Authorization Type 4/10   PT Start Time 1605   PT Stop Time 2876   PT Time Calculation (min) 40 min   Activity Tolerance No increased pain;Patient tolerated treatment well   Behavior During Therapy [facility] for tasks assessed/performed      Past Medical History:  Diagnosis Date  . Anxiety   . Constipation    uses docusate and fiber  . Degenerative arthritis of knee, bilateral 06/16/2015  . Depression   . Diabetes ([place])    diet managed-type 11  . GERD (gastroesophageal reflux disease)   . Headache   . Heart murmur    slight per pt- benign   . History of tachycardia   . Hyperlipidemia   . Hypertension    hx of tachycardia   . 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 Pneumonia    hx of this several times  . Shortness of breath dyspnea    with exertion  . Smoker 05/10/2015  . Tachycardia   . Thyroid disease    hyperthyroidism as a teenager - received some injections age 79 that corrected this   . Tobacco abuse     Past Surgical History:  Procedure Laterality Date  . ABDOMINAL HYSTERECTOMY  1993  . BACK SURGERY    . CHOLECYSTECTOMY    . COLONOSCOPY     30 years ago was normal per pt.   . ESOPHAGEAL DILATION     x 3  . fractured leg Right   . OVARIAN CYST REMOVAL  1972  . SALIVARY GLAND SURGERY    . TONSILLECTOMY     4 yoa  . TOTAL KNEE ARTHROPLASTY Left 12/24/2015   Procedure: LEFT TOTAL KNEE ARTHROPLASTY;  Surgeon: [name], MD;  Location: [place];  Service: Orthopedics;  Laterality: Left;  .  WRIST FRACTURE SURGERY  11/2008    There were no vitals filed for this visit.      Subjective Assessment - 07/01/16 1652    Subjective Pt reports she has mild low back pain today but that she has done a lot around her house today.    Pertinent History hx of R knee OA. hx of depression    Diagnostic tests MRI- stenosis    Patient Stated Goals improve back pain    Currently in Pain? Yes   Pain Score 1    Pain Location Back   Pain Orientation Right;Left       Therex: Resisted walking 4-way with 10#, x3 laps each; SBA-CGA for slight unsteadiness with side stepping; pt demonstrated fatigue of LLE>RLE Bil hip hike at stairs, 3x10 each; decreased pain in R knee; mod verbal/tactile cues to decrease pelvic rotation throoughout; LLE fatigue > RLE Bil bridging with diaphragmatic breathing with airex under feet for decreased forces through R knee, 2x10 Bil bridging with march and diaphragmatic breathing, 2x10; min verbal cues for proper technique, slight increase in back pain DKTC 2x30 sec stretch; decreased back pain following Simulated household chores (vacuuming, sweeping, mopping)  x 5 min; pt needed minor verbal cues for proper body mechanics but demonstrated good carry-over from previous session        PT Education - 07/01/16 1655    Education provided Yes   Education Details reviewed body mechanics with household chores, decrease pelvic rotation with exercise   Person(s) Educated Patient   Methods Explanation   Comprehension Verbalized understanding             PT Long Term Goals - 06/17/16 1655      PT LONG TERM GOAL #1   Title Pt will demonstrate proper sitting posture with lumbar roll in place independently    Time 8   Period Weeks   Status Achieved     PT LONG TERM GOAL #2   Title pt will be independent and compliant with HEP for pain relief and strengthening.    Time 8   Period Weeks   Status Partially Met     PT LONG TERM GOAL #3   Title pt will  demonstrate proper mechanics of common household chores to reduce pain and increase function with pain <3/10   Baseline 3.5-4/10 after 45 mins; has to stop   Time 8   Period Weeks   Status On-going     PT LONG TERM GOAL #4   Title pt will sore less than 30% disability on ODI for improved function    Time 8   Period Weeks   Status On-going               Plan - 07/01/16 1656    Clinical Impression Statement Pt has decreased back pain this session and was able to return to household duties with mild pain. She tolerated progressed hip strengthening exercises this date but continues to be limited by R knee pain. Pt demonstrated increased glute fatigue following exercises. She displayed good carry-over with proper body mechanics when performing household chores, but needed minor verbal cues for decreased trunk rotation throughout. Pt needs skilled PT intervention to address remaining deficits to maximize overall function.   Rehab Potential Good   Clinical Impairments Affecting Rehab Potential co-morbidities, tobacco use, little home support   PT Frequency 2x / week   PT Duration 8 weeks   PT Treatment/Interventions ADLs/Self Care Home Management;Aquatic Therapy;Cryotherapy;Electrical Stimulation;Moist Heat;Traction;Patient/family education;Neuromuscular re-education;Therapeutic exercise;Therapeutic activities;Functional mobility training;Gait training;Manual techniques;Passive range of motion;Dry needling   PT Next Visit Plan MET for SIJ, manual, hip strengthening   Consulted and Agree with Plan of Care Patient      Patient will benefit from skilled therapeutic intervention in order to improve the following deficits and impairments:  Decreased strength, Abnormal gait, Increased muscle spasms, Improper body mechanics, Impaired flexibility, Postural dysfunction, Decreased range of motion, Pain, Hypomobility  Visit Diagnosis: Lumbago with sciatica, right side  Unsteadiness on  feet  Abnormality of gait     Problem List Patient Active Problem List   Diagnosis Date Noted  . Tension headache 02/18/2016  . Degenerative arthritis of left knee 12/24/2015  . Left facial pain 10/29/2015  . Memory loss 10/29/2015  . Osteoarthritis Knees, bilateral, moderate 06/16/2015  . Smoker 05/10/2015  . Solitary pulmonary nodule 03/28/2013  . Cardiac murmur 03/15/2012  . Hypertension   . UNSPECIFIED VITAMIN D DEFICIENCY 05/29/2009  . Pure hypercholesterolemia 12/07/2007  . Major depressive disorder, recurrent episode, in partial remission ([place]) 12/07/2007  . Diabetes mellitus type 2, controlled ([place]) 08/10/2007   Geraldine Solar, SPT This entire session was performed under direct supervision and  direction of a licensed Chiropractor . I have personally read, edited and approve of the note as written. Gorden Harms. Tortorici, PT, DPT [phone]  Tortorici,Ashley 07/02/2016, 10:30 AM  [place] MAIN [facility] SERVICES [address] [place], [place], [address] Phone: [phone]   Fax:  [phone]  Name: Amanda [name] MRN: [id number] Date of Birth: 03/10/1951

## 2016-07-07 ENCOUNTER — Ambulatory Visit: Payer: Medicare Other | Attending: Orthopedic Surgery

## 2016-07-07 DIAGNOSIS — M5441 Lumbago with sciatica, right side: Secondary | ICD-10-CM | POA: Diagnosis not present

## 2016-07-07 DIAGNOSIS — R2681 Unsteadiness on feet: Secondary | ICD-10-CM | POA: Diagnosis not present

## 2016-07-07 DIAGNOSIS — R269 Unspecified abnormalities of gait and mobility: Secondary | ICD-10-CM

## 2016-07-07 NOTE — Therapy (Addendum)
[place] MAIN [facility] SERVICES [address] [place], [place], [address] Phone: [phone]   Fax:  [phone]  Physical Therapy Treatment  Patient Details  Name: Amanda [name] MRN: [id number] Date of Birth: 1951-10-02 Referring Provider: Dr. Marlou Sa  Encounter Date: 07/07/2016      PT End of Session - 07/07/16 1744    Visit Number 14   Number of Visits 17   Date for PT Re-Evaluation 07/15/16   Authorization Type 5/10   PT Start Time 7035   PT Stop Time 0093   PT Time Calculation (min) 44 min   Activity Tolerance No increased pain;Patient tolerated treatment well   Behavior During Therapy [facility] for tasks assessed/performed      Past Medical History:  Diagnosis Date  . Anxiety   . Constipation    uses docusate and fiber  . Degenerative arthritis of knee, bilateral 06/16/2015  . Depression   . Diabetes ([place])    diet managed-type 11  . GERD (gastroesophageal reflux disease)   . Headache   . Heart murmur    slight per pt- benign   . History of tachycardia   . Hyperlipidemia   . Hypertension    hx of tachycardia   . 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 Pneumonia    hx of this several times  . Shortness of breath dyspnea    with exertion  . Smoker 05/10/2015  . Tachycardia   . Thyroid disease    hyperthyroidism as a teenager - received some injections age 29 that corrected this   . Tobacco abuse     Past Surgical History:  Procedure Laterality Date  . ABDOMINAL HYSTERECTOMY  1993  . BACK SURGERY    . CHOLECYSTECTOMY    . COLONOSCOPY     30 years ago was normal per pt.   . ESOPHAGEAL DILATION     x 3  . fractured leg Right   . OVARIAN CYST REMOVAL  1972  . SALIVARY GLAND SURGERY    . TONSILLECTOMY     4 yoa  . TOTAL KNEE ARTHROPLASTY Left 12/24/2015   Procedure: LEFT TOTAL KNEE ARTHROPLASTY;  Surgeon: [name], MD;  Location: [place];  Service: Orthopedics;  Laterality: Left;  .  WRIST FRACTURE SURGERY  11/2008    There were no vitals filed for this visit.      Subjective Assessment - 07/07/16 1739    Subjective Pt reports slight tenderness of R hip this date. She reports performing housework over the weekend stating vacuuming went better but that washing clothes aggravates her back pain. She states she will perform exercises following laudry which will help decrease her back pain.   Pertinent History hx of R knee OA. hx of depression    Diagnostic tests MRI- stenosis    Patient Stated Goals improve back pain    Currently in Pain? Yes   Pain Score 3    Pain Location Hip   Pain Orientation Right   Pain Descriptors / Indicators Sore      Therex: Bil bridging, with diaphragmatic breathing, 2x10 with airex under feet R single leg bridging, with airex under feet, 1x10; pt had radicular symptoms down back of leg which was addressed with manual distraction REIS 1x10; decreased LBP and radicular symptoms Hip hikes, 1x10 BLE; pt limited by R knee pain  Manual:  MET for ER of R hip, x 2 through range; pt c/o  R knee pain throughout Manual R hip distraction with strap, 5x1 min bouts; pt reported decreased symptoms in back of RLE following       PT Education - 07/07/16 1742    Education provided Yes   Education Details exercise technique   Person(s) Educated Patient   Methods Explanation   Comprehension Verbalized understanding             PT Long Term Goals - 06/17/16 1655      PT LONG TERM GOAL #1   Title Pt will demonstrate proper sitting posture with lumbar roll in place independently    Time 8   Period Weeks   Status Achieved     PT LONG TERM GOAL #2   Title pt will be independent and compliant with HEP for pain relief and strengthening.    Time 8   Period Weeks   Status Partially Met     PT LONG TERM GOAL #3   Title pt will demonstrate proper mechanics of common household chores to reduce pain and increase function with pain <3/10    Baseline 3.5-4/10 after 45 mins; has to stop   Time 8   Period Weeks   Status On-going     PT LONG TERM GOAL #4   Title pt will sore less than 30% disability on ODI for improved function    Time 8   Period Weeks   Status On-going               Plan - 07/07/16 1821    Clinical Impression Statement Pt presents to therapy with mild c/o R hip pain. During hip strengthening exercises, pt had radicular symptoms down RLE. SPT addressed this with manual distraction of R hip and REIS which eliminated radicular symptoms. Pt main limiting factor continues to be R knee pain as she is limited during WB hip stregthening exercises. Pt needs continued skilled PT intervention to maximize overall function and decrease pain.   Rehab Potential Good   Clinical Impairments Affecting Rehab Potential co-morbidities, tobacco use, little home support   PT Frequency 2x / week   PT Duration 8 weeks   PT Treatment/Interventions ADLs/Self Care Home Management;Aquatic Therapy;Cryotherapy;Electrical Stimulation;Moist Heat;Traction;Patient/family education;Neuromuscular re-education;Therapeutic exercise;Therapeutic activities;Functional mobility training;Gait training;Manual techniques;Passive range of motion;Dry needling   PT Next Visit Plan MET for SIJ, manual, hip strengthening   Consulted and Agree with Plan of Care Patient      Patient will benefit from skilled therapeutic intervention in order to improve the following deficits and impairments:  Decreased strength, Abnormal gait, Increased muscle spasms, Improper body mechanics, Impaired flexibility, Postural dysfunction, Decreased range of motion, Pain, Hypomobility  Visit Diagnosis: Lumbago with sciatica, right side  Unsteadiness on feet  Abnormality of gait     Problem List Patient Active Problem List   Diagnosis Date Noted  . Tension headache 02/18/2016  . Degenerative arthritis of left knee 12/24/2015  . Left facial pain 10/29/2015  .  Memory loss 10/29/2015  . Osteoarthritis Knees, bilateral, moderate 06/16/2015  . Smoker 05/10/2015  . Solitary pulmonary nodule 03/28/2013  . Cardiac murmur 03/15/2012  . Hypertension   . UNSPECIFIED VITAMIN D DEFICIENCY 05/29/2009  . Pure hypercholesterolemia 12/07/2007  . Major depressive disorder, recurrent episode, in partial remission ([place]) 12/07/2007  . Diabetes mellitus type 2, controlled ([place]) 08/10/2007   Geraldine Solar, SPT 07/07/16, 6:30 PM This entire session was performed under direct supervision and direction of a licensed therapist/therapist assistant . I have personally read, edited and approve  of the note as written. Gorden Harms. Jerilee Space, PT, DPT [phone]  Tyris Eliot 07/08/2016, 10:15 AM  [place] MAIN [facility] SERVICES [address] [place], [place], [address] Phone: [phone]   Fax:  [phone]  Name: RASHIYA LOFLAND MRN: [id number] Date of Birth: 24-Oct-1951

## 2016-07-13 ENCOUNTER — Ambulatory Visit: Payer: Medicare Other

## 2016-07-13 DIAGNOSIS — M5441 Lumbago with sciatica, right side: Secondary | ICD-10-CM

## 2016-07-13 DIAGNOSIS — R2681 Unsteadiness on feet: Secondary | ICD-10-CM | POA: Diagnosis not present

## 2016-07-13 DIAGNOSIS — R269 Unspecified abnormalities of gait and mobility: Secondary | ICD-10-CM | POA: Diagnosis not present

## 2016-07-13 NOTE — Therapy (Signed)
[place] MAIN [facility] SERVICES [address] [place], [place], [address] Phone: [phone]   Fax:  [phone]  Physical Therapy Treatment/Discharge Summary  Patient Details  Name: Amanda [name] MRN: [id number] Date of Birth: 21-Jun-1951 Referring Provider: Dr. Marlou Sa  Encounter Date: 07/13/2016      PT End of Session - 07/13/16 1617    Visit Number 15   Number of Visits 17   Date for PT Re-Evaluation 07/15/16   Authorization Type 6/10   PT Start Time 1532   PT Stop Time 1622   PT Time Calculation (min) 50 min   Activity Tolerance No increased pain;Patient tolerated treatment well   Behavior During Therapy [facility] for tasks assessed/performed      Past Medical History:  Diagnosis Date  . Anxiety   . Constipation    uses docusate and fiber  . Degenerative arthritis of knee, bilateral 06/16/2015  . Depression   . Diabetes ([place])    diet managed-type 11  . GERD (gastroesophageal reflux disease)   . Headache   . Heart murmur    slight per pt- benign   . History of tachycardia   . Hyperlipidemia   . Hypertension    hx of tachycardia   . 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 Pneumonia    hx of this several times  . Shortness of breath dyspnea    with exertion  . Smoker 05/10/2015  . Tachycardia   . Thyroid disease    hyperthyroidism as a teenager - received some injections age 48 that corrected this   . Tobacco abuse     Past Surgical History:  Procedure Laterality Date  . ABDOMINAL HYSTERECTOMY  1993  . BACK SURGERY    . CHOLECYSTECTOMY    . COLONOSCOPY     30 years ago was normal per pt.   . ESOPHAGEAL DILATION     x 3  . fractured leg Right   . OVARIAN CYST REMOVAL  1972  . SALIVARY GLAND SURGERY    . TONSILLECTOMY     4 yoa  . TOTAL KNEE ARTHROPLASTY Left 12/24/2015   Procedure: LEFT TOTAL KNEE ARTHROPLASTY;  Surgeon: [name], MD;  Location: [place];  Service: Orthopedics;   Laterality: Left;  . WRIST FRACTURE SURGERY  11/2008    There were no vitals filed for this visit.      Subjective Assessment - 07/13/16 1534    Subjective Pt reports increased dull ache in low back/R hip. States she did a lot of driving and walking up/down stairs this weekend.   Pertinent History hx of R knee OA. hx of depression    Diagnostic tests MRI- stenosis    Patient Stated Goals improve back pain    Currently in Pain? Yes   Pain Score 3    Pain Location Back   Pain Orientation Right   Pain Descriptors / Indicators Dull;Aching      Manual: Trigger point ischemic release to R sacral erector spinae and glute max/med x 18 min Manual distraction with strap to R hip, 5x30-45 sec bouts each; pt reported decreased ache in R hip but still had mild R low back pain following treatment and walking short distance Grade 3 CPAs T12-L5, 2x30 sec bouts each; pt with slightly decreased pain following treatment  Therex: REIS 1x10, no change in low back or hip pain Bil hip hikes with 2.5# cuff weights, 2x12 each; pt limited  by R knee pain; min verbal/tactile cues for decreased pelvic rotation        PT Education - 12-Aug-2016 1634    Education provided Yes   Education Details discharge plan, updated HEP   Person(s) Educated Patient   Methods Explanation   Comprehension Verbalized understanding             PT Long Term Goals - 08/12/2016 1616      PT LONG TERM GOAL #1   Title Pt will demonstrate proper sitting posture with lumbar roll in place independently    Time 8   Period Weeks   Status Achieved     PT LONG TERM GOAL #2   Title pt will be independent and compliant with HEP for pain relief and strengthening.    Time 8   Period Weeks   Status Achieved     PT LONG TERM GOAL #3   Title pt will demonstrate proper mechanics of common household chores to reduce pain and increase function with pain <3/10   Baseline 3.5-4/10 after 45 mins; has to stop   Time 8   Period  Weeks   Status Partially Met     PT LONG TERM GOAL #4   Title pt will sore less than 30% disability on ODI for improved function    Baseline 28% (08/12/2016)   Time 8   Period Weeks   Status Achieved               Plan - 12-Aug-2016 1636    Clinical Impression Statement SPT reassessed pt's goals this date and pt has met every goal except for decreased pain with household chores. She is compliant with HEP, able to manage her pain prior to and after pain onset during household duties, and is able to perform self soft tissue release of erector spinae and R glutes. Pt was given an updated HEP and will be discharged due to plateau in progress. She was educated that if her pain worsens in the next few months that she could return to therapy with referral.    Rehab Potential Good   Clinical Impairments Affecting Rehab Potential co-morbidities, tobacco use, little home support   PT Frequency 2x / week   PT Duration 8 weeks   PT Treatment/Interventions ADLs/Self Care Home Management;Aquatic Therapy;Cryotherapy;Electrical Stimulation;Moist Heat;Traction;Patient/family education;Neuromuscular re-education;Therapeutic exercise;Therapeutic activities;Functional mobility training;Gait training;Manual techniques;Passive range of motion;Dry needling   Consulted and Agree with Plan of Care Patient      Patient will benefit from skilled therapeutic intervention in order to improve the following deficits and impairments:  Decreased strength, Abnormal gait, Increased muscle spasms, Improper body mechanics, Impaired flexibility, Postural dysfunction, Decreased range of motion, Pain, Hypomobility  Visit Diagnosis: Lumbago with sciatica, right side  Unsteadiness on feet  Abnormality of gait       G-Codes - August 12, 2016 1709    Functional Assessment Tool Used ODI   Functional Limitation Changing and maintaining body position   Changing and Maintaining Body Position Current Status (M7867) At least 1 percent  but less than 20 percent impaired, limited or restricted   Changing and Maintaining Body Position Goal Status (J4492) At least 1 percent but less than 20 percent impaired, limited or restricted   Changing and Maintaining Body Position Discharge Status (E1007) At least 1 percent but less than 20 percent impaired, limited or restricted      Problem List Patient Active Problem List   Diagnosis Date Noted  . Tension headache 02/18/2016  . Degenerative arthritis  of left knee 12/24/2015  . Left facial pain 10/29/2015  . Memory loss 10/29/2015  . Osteoarthritis Knees, bilateral, moderate 06/16/2015  . Smoker 05/10/2015  . Solitary pulmonary nodule 03/28/2013  . Cardiac murmur 03/15/2012  . Hypertension   . UNSPECIFIED VITAMIN D DEFICIENCY 05/29/2009  . Pure hypercholesterolemia 12/07/2007  . Major depressive disorder, recurrent episode, in partial remission ([place]) 12/07/2007  . Diabetes mellitus type 2, controlled ([place]) 08/10/2007   Geraldine Solar, SPT This entire session was performed under direct supervision and direction of a licensed therapist/therapist assistant . I have personally read, edited and approve of the note as written. Gorden Harms. Tortorici, PT, DPT [phone]  Tortorici,Ashley 07/13/2016, 5:10 PM  [place] MAIN [facility] SERVICES [address] [place], [place], [address] Phone: [phone]   Fax:  [phone]  Name: Amanda [name] MRN: [id number] Date of Birth: 16-Feb-1951

## 2016-07-17 ENCOUNTER — Ambulatory Visit (INDEPENDENT_AMBULATORY_CARE_PROVIDER_SITE_OTHER): Payer: Medicare Other | Admitting: Cardiovascular Disease

## 2016-07-17 ENCOUNTER — Encounter: Payer: Self-pay | Admitting: Cardiovascular Disease

## 2016-07-17 VITALS — BP 120/62 | HR 76 | Ht 68.0 in | Wt 216.2 lb

## 2016-07-17 DIAGNOSIS — R079 Chest pain, unspecified: Secondary | ICD-10-CM

## 2016-07-17 DIAGNOSIS — R002 Palpitations: Secondary | ICD-10-CM

## 2016-07-17 DIAGNOSIS — R011 Cardiac murmur, unspecified: Secondary | ICD-10-CM | POA: Diagnosis not present

## 2016-07-17 DIAGNOSIS — Z720 Tobacco use: Secondary | ICD-10-CM

## 2016-07-17 NOTE — Progress Notes (Signed)
Cardiology Office Note   Date:  07/17/2016   ID:  Amanda [name], DOB 1951/08/27, MRN [id number]  PCP:  Owens Loffler, MD  Cardiologist:   Kathlyn Sacramento, MD   Chief Complaint  Patient presents with  . Other    12 month follow up. Meds reviewed by the patient verbally.       History of Present Illness: Amanda [name] is a 65 y.o. female who presents for a follow-up visit regarding palpitations. Previous cardiac workup in 2013 included a treadmill stress test  which was normal, echocardiogram showed normal LV systolic function, mild mitral regurgitation and no evidence of pulmonary hypertension. A 48 hour Holter monitor showed intermittent sinus tachycardia with occasional PACs but no other significant arrhythmias. Her palpitations significantly after adding Metoprolol.  She has been doing reasonably well and denies chest pain or worsening shortness of breath. She continues to smoke. She continues to have mild palpitations well controlled on metoprolol.    Past Medical History:  Diagnosis Date  . Anxiety   . Constipation    uses docusate and fiber  . Degenerative arthritis of knee, bilateral 06/16/2015  . Depression   . Diabetes ([place])    diet managed-type 11  . GERD (gastroesophageal reflux disease)   . Headache   . Heart murmur    slight per pt- benign   . History of tachycardia   . Hyperlipidemia   . Hypertension    hx of tachycardia   . Neuromuscular disorder ([place])    nerve damage in back and shoulder  . Pneumonia    hx of this several times  . Shortness of breath dyspnea    with exertion  . Smoker 05/10/2015  . Tachycardia   . Thyroid disease    hyperthyroidism as a teenager - received some injections age 18 that corrected this   . Tobacco abuse     Past Surgical History:  Procedure Laterality Date  . ABDOMINAL HYSTERECTOMY  1993  . BACK SURGERY    . CHOLECYSTECTOMY    . COLONOSCOPY     30 years ago was normal per pt.   . ESOPHAGEAL DILATION     x 3  . fractured leg Right   . OVARIAN CYST REMOVAL  1972  . SALIVARY GLAND SURGERY    . TONSILLECTOMY     4 yoa  . TOTAL KNEE ARTHROPLASTY Left 12/24/2015   Procedure: LEFT TOTAL KNEE ARTHROPLASTY;  Surgeon: [name], MD;  Location: [place];  Service: Orthopedics;  Laterality: Left;  . WRIST FRACTURE SURGERY  11/2008     Current Outpatient Prescriptions  Medication Sig Dispense Refill  . amitriptyline (ELAVIL) 50 MG tablet Take 2 tablets at night 60 tablet 11  . Ascorbic Acid (VITAMIN C) 1000 MG tablet Take 1,000 mg by mouth daily.    Marland Kitchen atorvastatin (LIPITOR) 40 MG tablet TAKE ONE TABLET BY MOUTH ONCE DAILY 90 tablet 1  . Cholecalciferol (VITAMIN D) 1000 UNITS capsule Take 1,000 Units by mouth 2 (two) times daily.     . clonazePAM (KLONOPIN) 0.5 MG tablet Take 1 tablet (0.5 mg total) by mouth 2 (two) times daily as needed. 30 tablet 3  . cyclobenzaprine (FLEXERIL) 10 MG tablet TAKE ONE TABLET BY MKOUTH THREE TIMES A DAY AS NEEDED FOR MUSCLE SPASMS  50 tablet 5  . diphenhydrAMINE (BENADRYL) 25 mg capsule Take 25 mg by mouth 2 (two) times daily.    Marland Kitchen esomeprazole (NEXIUM) 40 MG capsule Take 1 capsule (  40 mg total) by mouth daily before breakfast. 90 capsule 3  . FLUoxetine (PROZAC) 40 MG capsule TAKE TWO CAPSULES BY MOUTH ONCE DAILY 60 capsule 5  . magnesium oxide (MAG-OX) 400 MG tablet Take 400 mg by mouth daily.    . metoprolol tartrate (LOPRESSOR) 25 MG tablet Take 0.5 tablets (12.5 mg total) by mouth 2 (two) times daily. 90 tablet 0  . Multiple Vitamin (MULTIVITAMIN) tablet Take 1 tablet by mouth daily.    . naproxen (NAPROSYN) 125 MG/5ML suspension Take by mouth 2 (two) times daily with a meal.    . oxyCODONE-acetaminophen (PERCOCET/ROXICET) 5-325 MG tablet Take by mouth every 6 (six) hours as needed for severe pain. Reported on 05/12/2016    . Psyllium (METAMUCIL PO) Take 1 capsule by mouth daily.     No current facility-administered medications for this visit.      Allergies:   Diazepam    Social History:  The patient  reports that she has been smoking Cigarettes.  She has a 22.50 pack-year smoking history. She has never used smokeless tobacco. She reports that she drinks alcohol. She reports that she does not use drugs.   Family History:  The patient's family history includes Arthritis in her mother; Cancer in her paternal grandmother; Colon polyps in her mother and sister; Diabetes in her mother; Heart disease in her mother; Hypercholesterolemia in her sister; Hyperlipidemia in her mother.    ROS:  Please see the history of present illness.   Otherwise, review of systems are positive for none.   All other systems are reviewed and negative.    PHYSICAL EXAM: VS:  Ht 5\' 8"  (1.727 m)   Wt 216 lb 4 oz (98.1 kg)   BMI 32.88 kg/m  , BMI Body mass index is 32.88 kg/m. GEN: Well nourished, well developed, in no acute distress  HEENT: normal  Neck: no JVD, carotid bruits, or masses Cardiac: RRR; no  rubs, or gallops,no edema . 2/6 crescendo decrescendo systolic murmur in the aortic area which is early to mid peaking. Respiratory:  clear to auscultation bilaterally, normal work of breathing GI: soft, nontender, nondistended, + BS MS: no deformity or atrophy  Skin: warm and dry, no rash Neuro:  Strength and sensation are intact Psych: euthymic mood, full affect   EKG:  EKG is ordered today. The ekg ordered today demonstrates normal sinus rhythm with no significant ST or T wave changes.   Recent Labs: 09/18/2015: ALT 15; TSH 0.62 12/25/2015: BUN 8; Creatinine, Ser 0.73; Hemoglobin 10.2; Platelets 157; Potassium 4.1; Sodium 136    Lipid Panel    Component Value Date/Time   CHOL 194 05/03/2014 1158   CHOL 201 (H) 09/18/2013 1016   TRIG 108.0 05/03/2014 1158   HDL 45.90 05/03/2014 1158   HDL 53 09/18/2013 1016   CHOLHDL 4 05/03/2014 1158   VLDL 21.6 05/03/2014 1158   LDLCALC 127 (H) 05/03/2014 1158   LDLCALC 130 (H) 09/18/2013 1016    LDLDIRECT 209.2 05/28/2009 0909      Wt Readings from Last 3 Encounters:  07/17/16 216 lb 4 oz (98.1 kg)  02/18/16 206 lb (93.4 kg)  01/23/16 206 lb 8 oz (93.7 kg)         ASSESSMENT AND PLAN:  1.  Palpitations: Previous mild PACs. Symptoms are well-controlled on small dose metoprolol.  2. Cardiac murmur: This seems to be more prominent than before. I requested an echocardiogram for follow-up.  3. Essential hypertension: Blood pressure is well controlled on  current medications.  4 . Tobacco use: I again discussed with her the importance of smoking cessation.     Disposition:   FU with me in 1 year  Signed,  Kathlyn Sacramento, MD  07/17/2016 11:48 AM    [place]

## 2016-07-17 NOTE — Patient Instructions (Addendum)
Medication Instructions:  Your physician recommends that you continue on your current medications as directed. Please refer to the Current Medication list given to you today.   Labwork: none  Testing/Procedures: Your physician has requested that you have an echocardiogram. Echocardiography is a painless test that uses sound waves to create images of your heart. It provides your doctor with information about the size and shape of your heart and how well your heart's chambers and valves are working. This procedure takes approximately one hour. There are no restrictions for this procedure.    Follow-Up: Your physician wants you to follow-up in: one year with Dr. Arida.  You will receive a reminder letter in the mail two months in advance. If you don't receive a letter, please call our office to schedule the follow-up appointment.   Any Other Special Instructions Will Be Listed Below (If Applicable).     If you need a refill on your cardiac medications before your next appointment, please call your pharmacy.  Echocardiogram An echocardiogram, or echocardiography, uses sound waves (ultrasound) to produce an image of your heart. The echocardiogram is simple, painless, obtained within a short period of time, and offers valuable information to your health care provider. The images from an echocardiogram can provide information such as:  Evidence of coronary artery disease (CAD).  Heart size.  Heart muscle function.  Heart valve function.  Aneurysm detection.  Evidence of a past heart attack.  Fluid buildup around the heart.  Heart muscle thickening.  Assess heart valve function. LET YOUR HEALTH CARE PROVIDER KNOW ABOUT:  Any allergies you have.  All medicines you are taking, including vitamins, herbs, eye drops, creams, and over-the-counter medicines.  Previous problems you or members of your family have had with the use of anesthetics.  Any blood disorders you  have.  Previous surgeries you have had.  Medical conditions you have.  Possibility of pregnancy, if this applies. BEFORE THE PROCEDURE  No special preparation is needed. Eat and drink normally.  PROCEDURE   In order to produce an image of your heart, gel will be applied to your chest and a wand-like tool (transducer) will be moved over your chest. The gel will help transmit the sound waves from the transducer. The sound waves will harmlessly bounce off your heart to allow the heart images to be captured in real-time motion. These images will then be recorded.  You may need an IV to receive a medicine that improves the quality of the pictures. AFTER THE PROCEDURE You may return to your normal schedule including diet, activities, and medicines, unless your health care provider tells you otherwise.   This information is not intended to replace advice given to you by your health care provider. Make sure you discuss any questions you have with your health care provider.   Document Released: 11/20/2000 Document Revised: 12/14/2014 Document Reviewed: 07/31/2013 Elsevier Interactive Patient Education 2016 [employer] Inc.  

## 2016-08-05 ENCOUNTER — Ambulatory Visit (INDEPENDENT_AMBULATORY_CARE_PROVIDER_SITE_OTHER): Payer: Medicare Other

## 2016-08-05 ENCOUNTER — Other Ambulatory Visit: Payer: Self-pay

## 2016-08-05 DIAGNOSIS — R011 Cardiac murmur, unspecified: Secondary | ICD-10-CM

## 2016-08-09 ENCOUNTER — Other Ambulatory Visit: Payer: Self-pay | Admitting: Cardiovascular Disease

## 2016-08-17 ENCOUNTER — Other Ambulatory Visit: Payer: Self-pay | Admitting: *Deleted

## 2016-08-17 MED ORDER — METOPROLOL TARTRATE 25 MG PO TABS: 25.0000 mg | ORAL_TABLET | Freq: Two times a day (BID) | ORAL | 3 refills | Status: DC

## 2016-08-18 ENCOUNTER — Other Ambulatory Visit: Payer: Self-pay | Admitting: Family Medicine

## 2016-08-18 NOTE — Telephone Encounter (Signed)
Last office visit 01/23/2016.  Last refilled 09/10/2014 for #50 with 5 refills.

## 2016-08-20 ENCOUNTER — Telehealth: Payer: Self-pay | Admitting: Cardiovascular Disease

## 2016-08-20 NOTE — Telephone Encounter (Signed)
°*  STAT* If patient is at the pharmacy, call can be transferred to refill team.   1. Which medications need to be refilled? (please list name of each medication and dose if known)  Metoprolol 5 mg 1 pill 2 x a day  2. Which pharmacy/location (including street and city if local pharmacy) is medication to be sent to?  [facility] on [address]  3. Do they need a 30 day or 90 day supply?  90 day  She is being told by pharmacy that it is to early to refill

## 2016-08-21 NOTE — Telephone Encounter (Signed)
I spoke with Crystal from [facility] she mentioned that pt recently picked up Metoprolol with correct instructions on 08/17/16. Pt is not allowed per insurance to pick up medication early due to pt having recent refill and enough to hold her until next refill.

## 2016-09-04 ENCOUNTER — Encounter: Payer: Self-pay | Admitting: Neurology

## 2016-09-04 ENCOUNTER — Ambulatory Visit (INDEPENDENT_AMBULATORY_CARE_PROVIDER_SITE_OTHER): Payer: Medicare Other | Admitting: Neurology

## 2016-09-04 VITALS — BP 128/68 | HR 80 | Temp 98.2°F | Ht 68.0 in | Wt 216.1 lb

## 2016-09-04 DIAGNOSIS — R51 Headache: Secondary | ICD-10-CM

## 2016-09-04 DIAGNOSIS — M542 Cervicalgia: Secondary | ICD-10-CM | POA: Diagnosis not present

## 2016-09-04 DIAGNOSIS — R519 Headache, unspecified: Secondary | ICD-10-CM

## 2016-09-04 MED ORDER — AMITRIPTYLINE HCL 50 MG PO TABS: ORAL_TABLET | ORAL | 3 refills | Status: DC

## 2016-09-04 NOTE — Patient Instructions (Addendum)
1. Refer to PT at [facility] for neck pain 2. Continue all your medications 3. Follow-up in 6 months

## 2016-09-04 NOTE — Progress Notes (Signed)
NEUROLOGY FOLLOW UP OFFICE NOTE  Amanda [name] [id number]  HISTORY OF PRESENT ILLNESS: I had the pleasure of seeing Amanda [name] in follow-up in the neurology clinic on 09/04/2016.  The patient was last seen 6 months ago for left facial pain/headaches. On her last visit, amitriptyline was increased to 156m qhs. She reports that the higher dose is working pretty well, she denies any pain, but the left temporal region continues to feel different, "it just feels there is something there all the time." There is some tenderness, especially when pressing on different parts. She usually takes 1&1/2 tablets of amitriptyline at night, but when it feels strange on the left side all day, she takes 2 tablets and feels a little better the next day. The higher dose definitely helps with the pain in her mid-back. She has noticed neck pain and "grating" sounds in her neck. She has spinal stenosis in the lumbar region and has been undergoing PT.   HPI 10/25/2015: This is a pleasant 65yo RH woman with a history of hypertension, hyperlipidemia, diet-controlled diabetes, neuropathy, anxiety, who presented for evaluation of worsening memory and headaches. She started noticing worsening memory changes since last summer. She feels foggy a lot of times and feels her memory is "off the charts." She cannot recall conversations from 15 minutes prior. She has had trouble with long-term memory as well, and her family is concerned this is due to taking Prozac for many years. She feels her depression is under control as long as she takes medication. She lives with her husband and daughter, who have noticed memory changes as well. Her daughter took over bill payments 1-1/2 years ago. She reports overall compliance with medications. She denies getting lost driving. She has occasional word-finding difficulties. There is no family history of dementia. She has had several falls and head injuries over the years. She denies any  significant alcohol intake.   She reports headaches started around 2 years ago after she had a bad fall in school and fell on her knees with her head hitting the door. She has pain mostly over the left side of her head, with some tingling over the left temporal region. She feels she has to rub it to feel better. She reports the left side of her head "just feels strange," tight, as if something is under her skin making it feel tight and tender. She also reports a sore spot over the vertex. Some days her headaches are like a toothache, she feels they are like a muscle ache and rubbing it helps but sometimes tender to touch. Pain occurs around 3 times a week, lasting 2-3 days. She feels there has been deformity changes over her forehead, early this year it looked like there was a big egg on her forehead, and she now feels a ridge over her forehead. She denies any diplopia, dysarthria, dysphagia, but has occasional pain with chewing over the left jaw and back of her ear. ESR and CRP normal. She has neck and back pain, sometimes her whole left side hurts. She has had chronic spinal nerve damage since her back surgery, with stinging/burning pain that amitriptyline helps with. She has occasional numbness in both feet and bilateral leg pain, as if her "bones are bruised." Taking magnesium has helped some with this. Her mother and sister have migraines.   I personally reviewed MRI brain with and without contrast done 09/20/15 which did not show any acute changes, there was mild chronic microvascular disease.  PAST MEDICAL HISTORY: Past Medical History:  Diagnosis Date  . Anxiety   . Constipation    uses docusate and fiber  . Degenerative arthritis of knee, bilateral 06/16/2015  . Depression   . Diabetes ([place])    diet managed-type 11  . GERD (gastroesophageal reflux disease)   . Headache   . Heart murmur    slight per pt- benign   . History of tachycardia   . Hyperlipidemia   . Hypertension    hx of  tachycardia   . Neuromuscular disorder ([place])    nerve damage in back and shoulder  . Pneumonia    hx of this several times  . Shortness of breath dyspnea    with exertion  . Smoker 05/10/2015  . Tachycardia   . Thyroid disease    hyperthyroidism as a teenager - received some injections age 65 that corrected this   . Tobacco abuse     MEDICATIONS: Current Outpatient Prescriptions on File Prior to Visit  Medication Sig Dispense Refill  . Amitriptyline 14m Ascorbic Acid (VITAMIN C) 1000 MG tablet Take 2 tablets qhs Take 1,000 mg by mouth daily.    .Marland Kitchenatorvastatin (LIPITOR) 40 MG tablet TAKE ONE TABLET BY MOUTH ONCE DAILY 90 tablet 1  . Cholecalciferol (VITAMIN D) 1000 UNITS capsule Take 1,000 Units by mouth 2 (two) times daily.     . clonazePAM (KLONOPIN) 0.5 MG tablet Take 1 tablet (0.5 mg total) by mouth 2 (two) times daily as needed. 30 tablet 3  . cyclobenzaprine (FLEXERIL) 10 MG tablet TAKE ONE TABLET BY MOUTH THREE TIMES DAILY AS NEEDED FOR MUSCLE SPASM 50 tablet 4  . diphenhydrAMINE (BENADRYL) 25 mg capsule Take 25 mg by mouth 2 (two) times daily.    .Marland Kitchenesomeprazole (NEXIUM) 40 MG capsule Take 1 capsule (40 mg total) by mouth daily before breakfast. 90 capsule 3  . FLUoxetine (PROZAC) 40 MG capsule TAKE TWO CAPSULES BY MOUTH ONCE DAILY 60 capsule 5  . magnesium oxide (MAG-OX) 400 MG tablet Take 400 mg by mouth daily.    . metoprolol tartrate (LOPRESSOR) 25 MG tablet Take 1 tablet (25 mg total) by mouth 2 (two) times daily. 180 tablet 3  . Multiple Vitamin (MULTIVITAMIN) tablet Take 1 tablet by mouth daily.    . naproxen (NAPROSYN) 125 MG/5ML suspension Take by mouth 2 (two) times daily with a meal.    . Psyllium (METAMUCIL PO) Take 1 capsule by mouth daily.    .Marland KitchenoxyCODONE-acetaminophen (PERCOCET/ROXICET) 5-325 MG tablet Take by mouth every 6 (six) hours as needed for severe pain. Reported on 05/12/2016     No current facility-administered medications on file prior to visit.      ALLERGIES: Allergies  Allergen Reactions  . Diazepam Other (See Comments)    REACTION: pt states she gets very angry and abusive verbally on this medication     FAMILY HISTORY: Family History  Problem Relation Age of Onset  . Hyperlipidemia Mother   . Arthritis Mother   . Diabetes Mother   . Heart disease Mother   . Colon polyps Mother   . Cancer Paternal Grandmother     breast  . Hypercholesterolemia Sister   . Colon polyps Sister   . Colon cancer Neg Hx   . Esophageal cancer Neg Hx   . Rectal cancer Neg Hx   . Stomach cancer Neg Hx     SOCIAL HISTORY: Social History   Social History  . Marital status: Married  Spouse name: N/A  . Number of children: 2  . Years of education: N/A   Occupational History  . [employer]   Social History Main Topics  . Smoking status: Current Every Day Smoker    Packs/day: 0.50    Years: 45.00    Types: Cigarettes    Last attempt to quit: 12/24/2015  . Smokeless tobacco: Never Used  . Alcohol use 0.0 oz/week     Comment: glass of wine twice a month   . Drug use: No  . Sexual activity: Not on file   Other Topics Concern  . Not on file   Social History Narrative  . No narrative on file    REVIEW OF SYSTEMS: Constitutional: No fevers, chills, or sweats, no generalized fatigue, change in appetite Eyes: No visual changes, double vision, eye pain Ear, nose and throat: No hearing loss, ear pain, nasal congestion, sore throat Cardiovascular: No chest pain, palpitations Respiratory:  No shortness of breath at rest or with exertion, wheezes GastrointestinaI: No nausea, vomiting, diarrhea, abdominal pain, fecal incontinence Genitourinary:  No dysuria, urinary retention or frequency Musculoskeletal:  + neck pain, back pain Integumentary: No rash, pruritus, skin lesions Neurological: as above Psychiatric: No depression, insomnia, anxiety Endocrine: No palpitations, fatigue,  diaphoresis, mood swings, change in appetite, change in weight, increased thirst Hematologic/Lymphatic:  No anemia, purpura, petechiae. Allergic/Immunologic: no itchy/runny eyes, nasal congestion, recent allergic reactions, rashes  PHYSICAL EXAM: Vitals:   09/04/16 1354  BP: 128/68  Pulse: 80  Temp: 98.2 F (36.8 C)   General: No acute distress, no temporal or occipital tenderness Head:  Normocephalic/atraumatic Neck: supple, no paraspinal tenderness, full range of motion Heart:  Regular rate and rhythm Lungs:  Clear to auscultation bilaterally Back: No paraspinal tenderness Skin/Extremities: No rash, no edema Neurological Exam: alert and oriented to person, place, and time. No aphasia or dysarthria. Fund of knowledge is appropriate.  Recent and remote memory are intact.  Attention and concentration are normal.    Able to name objects and repeat phrases. Cranial nerves: Pupils equal, round, reactive to light. Extraocular movements intact with no nystagmus. Visual fields full. Decreased light touch on left V1-3. No facial asymmetry. Tongue, uvula, palate midline.  Motor: Bulk and tone normal, muscle strength 5/5 throughout with no pronator drift.  Sensation to light touch intact.  No extinction to double simultaneous stimulation.  Deep tendon reflexes 2+ throughout, toes downgoing.  Finger to nose testing intact.  Gait narrow-based and steady, able to tandem walk adequately.  Romberg negative.  IMPRESSION: This is a pleasant 65 yo RH woman with a history of hypertension, hyperlipidemia, diet-controlled diabetes, neuropathy, with headaches localized over the left hemisphere. She reports the pain has improved with amitriptyline, however the strange sensation on the left side is constant. She is taking amitriptyline 66m 1 & 1/2 tabs qhs, and takes 2 tabs when the strange feeling is worse during the day. She denies any side effects on the medication. She reports neck pain and wonders about nerve  impingement causing her symptoms, this is certainly a possibility, she will be referred for PT for neck pain. She will follow-up in 6 months and knows to call for any changes.   Thank you for allowing me to participate in her care.  Please do not hesitate to call for any questions or concerns.  The duration of this appointment visit was 25 minutes of face-to-face time with the patient.  Greater than 50% of this time was  spent in counseling, explanation of diagnosis, planning of further management, and coordination of care.   Ellouise Newer, M.D.   CC: Dr. Lorelei Pont

## 2016-09-14 ENCOUNTER — Encounter: Payer: Self-pay | Admitting: Physical Therapy

## 2016-09-14 ENCOUNTER — Ambulatory Visit: Payer: Medicare Other | Attending: Neurology

## 2016-09-14 DIAGNOSIS — M542 Cervicalgia: Secondary | ICD-10-CM | POA: Insufficient documentation

## 2016-09-14 NOTE — Patient Instructions (Signed)
[email or web address]  Cervical retractions UT stretch while seated Levator scap while seated

## 2016-09-14 NOTE — Therapy (Addendum)
[facility] [address] [place], [place], [address] Phone: [phone]   Fax:  [phone]  Physical Therapy Evaluation  Patient Details  Name: Amanda [name] MRN: [id number] Date of Birth: 03/09/1951 Referring Provider: Karen Aquino, MD  Encounter Date: 09/14/2016      PT End of Session - 09/14/16 1504    Visit Number 1   Number of Visits 13   Date for PT Re-Evaluation 10/26/16   PT Start Time 1345   PT Stop Time 1450   PT Time Calculation (min) 65 min   Activity Tolerance Patient tolerated treatment well   Behavior During Therapy WFL for tasks assessed/performed      Past Medical History:  Diagnosis Date  . Anxiety   . Constipation    uses docusate and fiber  . Degenerative arthritis of knee, bilateral 06/16/2015  . Depression   . Diabetes ([facility])    diet managed-type 11  . GERD (gastroesophageal reflux disease)   . Headache   . Heart murmur    slight per pt- benign   . History of tachycardia   . Hyperlipidemia   . Hypertension    hx of tachycardia   . 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 Pneumonia    hx of this several times  . Shortness of breath dyspnea    with exertion  . Smoker 05/10/2015  . Tachycardia   . Thyroid disease    hyperthyroidism as a teenager - received some injections age 65 that corrected this   . Tobacco abuse     Past Surgical History:  Procedure Laterality Date  . ABDOMINAL HYSTERECTOMY  1993  . BACK SURGERY    . CHOLECYSTECTOMY    . COLONOSCOPY     30 years ago was normal per pt.   . ESOPHAGEAL DILATION     x 3  . fractured leg Right   . OVARIAN CYST REMOVAL  1972  . SALIVARY GLAND SURGERY    . TONSILLECTOMY     4 yoa  . TOTAL KNEE ARTHROPLASTY Left 12/24/2015   Procedure: LEFT TOTAL KNEE ARTHROPLASTY;  Surgeon: Scott Gregory Dean, MD;  Location: [facility] OR;  Service: Orthopedics;  Laterality: Left;  . WRIST FRACTURE SURGERY   11/2008    There were no vitals filed for this visit.       Subjective Assessment - 09/14/16 1457    Subjective L sided neck pain which radiates into L side of face, down LUE intermittently   Pertinent History Pt reports having neck, shoulder, shoulder blade pain for at least 1 year. She states the the whole left side of her head feels sore "like it's bruised." She states she gets "prickly feelings in her temples." She had a CT scan which was negative and states that her neurologist told her she thinks it's more of a nerve problem since the CT scan didn't show anything. She reports that her medication helps the burning, stinging, prickly feeling. She states if her neck is really bothering her, she will have n/t in her LUE but it does not go past her elbow. She reports having headaches every day and if it is a severe headache, she reports she will get slightly blurred vision in her L eye. Most days it is better in the AM and gets worse as the day goes on. She denies B/B changing but does report increased nausea but she attributes that to her   stomach. She reports rest and rubbing on the L side of her face make it feel better. This pain does not affect her sleep once she is asleep; sleeping on her L side is the most comfortable. She has not tried ice or heat. She can't provide any aggravating movements for her neck pain. She reports having a really bad fall 3 years ago when she tripped on a doorstop, fell onto her knees and ended up hitting her head on 2 doors; her neck and headaches began after that fall. She is right handed. She reports she joined silver sneakers and she is going to begin working out at the hospital.   Patient Stated Goals decrease pain    Currently in Pain? Yes   Pain Score 4    Pain Location Neck   Pain Orientation Left   Pain Descriptors / Indicators Aching;Burning   Pain Type Chronic pain   Pain Onset More than a month ago   Pain Relieving Factors medication, rest            [facility] PT Assessment - 09/15/16 1555      Assessment   Medical Diagnosis neck pain   Referring Provider Karen Aquino, MD   Onset Date/Surgical Date 09/15/15   Hand Dominance Right   Next MD Visit 03/2017   Prior Therapy yes but not for present issue     Precautions   Precautions None     Restrictions   Weight Bearing Restrictions No     Balance Screen   Has the patient fallen in the past 6 months No   Has the patient had a decrease in activity level because of a fear of falling?  Yes   Is the patient reluctant to leave their home because of a fear of falling?  No     Home Environment   Living Environment Private residence   Living Arrangements Spouse/significant other   Available Help at Discharge Family   Type of Home House   Home Access Stairs to enter   Entrance Stairs-Number of Steps 3   Entrance Stairs-Rails Right;Left   Home Layout One level   Home Equipment Walker - 4 wheels;Cane - single point     Prior Function   Level of Independence Independent   Vocation Part time employment   Vocation Requirements substitute teacher     Cognition   Overall Cognitive Status Within Functional Limits for tasks assessed          SENSATION L C3 light tough slightly diminished; all else WNL  AROM  Left Right  Cervical flexion 49 49, painful on L and R  Cervical extension    Cervical rotation 55 painful on L, radiating up 60 painful on L  Cervical lateral flexion 25% loss 25% loss, stretching/tight feeling on L   BUE WFL, non-painful, no change in symptoms  PROM and PROM with OP:  Left Right  Cervical flexion    Cervical extension "tightness" with PROM  Cervical rotation Painful during PROM, increased L side face pain with OP Painful during PROM, slightly increased pain with OP  Cervical lateral flexion "soreness" reported in R neck with L LF Painful on L side of neck with R LF    STRENGTH (on scale of 0-5/5) Grip R: 60# Grip L: 58#  bil shoulder abd 4/5 with  slight pulling in lateral shoulder; all other MMT 5/5, non-painful   PALPATION Increased hypomobility of cervical and thoracic spine throughout TTP throughout cervical spine CPAs, especially C2   which recreated her L facial pain TTP with L unilateral PAs, especially C2 which recreated her L sided facial pain L cervical erector spinae increased soft tissue restrictions and recreated pt's face pain  L suboccipitals increased soft tissue restrictions and recreated pt's face pain TTP of L upper trap, levator scap, periscapular musculature Pt had increased L sided neck pain with cervical isometrics in all directions  POSTURE Increased forward head posture, increased thoracic kyphosis, decreased lordosis in sitting    Therex: Cervical retractions in sitting and standing with back against wall x 5 mins total; pt required max verbal and tactile cues for proper technique Seated L UT and L levator scap stretch x 30 seconds each  Manual: Cross friction and trigger point ischemic release to L suboccipitals x 5 mins; pt reported increased L facial pain with palpation which slightly diminished following treatment  Suboccipital MET x3 and x5 with 6 sec holds each; pt reported decreased pain following exercise  Manual cervical distraction x 1 min total; pt reported decreased pain following       PT Education - 09/14/16 1504    Education provided Yes   Education Details exam findings, POC, HEP   Person(s) Educated Patient   Methods Explanation   Comprehension Verbalized understanding             PT Long Term Goals - 09/14/16 1512      PT LONG TERM GOAL #1   Title Pt will demonstrate improved cervical rotation AROM by 10 deg bilaterally and pain-free to demonstrate improved overall function.   Baseline R rotation: 60 deg; L rotation: 55 deg with increased pain on L   Time 6   Period Weeks     PT LONG TERM GOAL #2   Title Pt will be independent with HEP to promote return to function  and decrease risk of reinjury.   Time 6   Period Weeks   Status New     PT LONG TERM GOAL #3   Title Pt will be able to correct and demonstrate proper sitting posture to decrease pain and improve overall function.   Time 6   Period Weeks   Status New               Plan - 09/14/16 1504    Clinical Impression Statement Pt is pleasant 65 YO F who presents to therapy this date with c/o neck pain over the last year which will radiate into L side of face, down LUE, and into L periscapular region. Pt has increased soft tissue restrictions and TTP of upper trap, levator scap, suboccipitals, SCM, and cervical erector spinae, L>R throughout. She also had pain with cervical isometrics and reported improved symptoms following suboccipital MET. Pt also hypomobile and painful throughout cervical CPAs, especially C2 which caused the pain to radiate into L side of face, and hypomobile throughout thoracic spine with increased TTP of T6. Pt needs skilled PT intervention to decrease pain and maximize overall funciton.   Rehab Potential Fair   Clinical Impairments Affecting Rehab Potential co-morbidities, time since onset, relatively sedentary lifestyle and improper posture   PT Frequency 2x / week   PT Duration 6 weeks   PT Treatment/Interventions ADLs/Self Care Home Management;Electrical Stimulation;Moist Heat;Traction;Functional mobility training;Therapeutic activities;Therapeutic exercise;Neuromuscular re-education;Patient/family education;Manual techniques;Passive range of motion;Dry needling;Taping   PT Next Visit Plan suboccipital MET, manual UT/LS/scalene stretch, soft tissue mobilization, thoracic/cervical mobilizations   PT Home Exercise Plan UT and levator scap stretch, cervical retractions   Consulted and  Agree with Plan of Care Patient      Patient will benefit from skilled therapeutic intervention in order to improve the following deficits and impairments:  Decreased mobility, Decreased  range of motion, Hypomobility, Increased muscle spasms, Improper body mechanics, Postural dysfunction, Obesity, Pain  Visit Diagnosis: Cervicalgia - Plan: PT plan of care cert/re-cert      G-Codes - 67/59/16 1550    Functional Assessment Tool Used clinical judgement, NPRS, cervical ROM   Functional Limitation Changing and maintaining body position   Changing and Maintaining Body Position Current Status (B8466) At least 40 percent but less than 60 percent impaired, limited or restricted   Changing and Maintaining Body Position Goal Status (Z9935) At least 20 percent but less than 40 percent impaired, limited or restricted       Problem List Patient Active Problem List   Diagnosis Date Noted  . Neck pain 09/04/2016  . Tension headache 02/18/2016  . Degenerative arthritis of left knee 12/24/2015  . Left facial pain 10/29/2015  . Memory loss 10/29/2015  . Osteoarthritis Knees, bilateral, moderate 06/16/2015  . Smoker 05/10/2015  . Solitary pulmonary nodule 03/28/2013  . Cardiac murmur 03/15/2012  . Hypertension   . UNSPECIFIED VITAMIN D DEFICIENCY 05/29/2009  . Pure hypercholesterolemia 12/07/2007  . Major depressive disorder, recurrent episode, in partial remission ([place]) 12/07/2007  . Diabetes mellitus type 2, controlled ([place]) 08/10/2007   This entire session was performed under direct supervision and direction of a licensed therapist/therapist assistant . I have personally read, edited and approve of the note as written.   Geraldine Solar, SPT  Phillips Grout PT, DPT   Huprich,Jason 09/15/2016, 3:53 PM  [facility] [address], [place], [address] Phone: [phone]   Fax:  [phone]  Name: BRISTAL STEFFY MRN: [id number] Date of Birth: 1951-03-08

## 2016-09-16 ENCOUNTER — Encounter: Payer: Self-pay | Admitting: Family Medicine

## 2016-09-16 ENCOUNTER — Ambulatory Visit (INDEPENDENT_AMBULATORY_CARE_PROVIDER_SITE_OTHER): Payer: Medicare Other | Admitting: Family Medicine

## 2016-09-16 ENCOUNTER — Ambulatory Visit: Payer: Medicare Other | Admitting: Physical Therapy

## 2016-09-16 VITALS — BP 138/84 | HR 79 | Temp 98.2°F | Ht 68.0 in | Wt 216.5 lb

## 2016-09-16 DIAGNOSIS — E119 Type 2 diabetes mellitus without complications: Secondary | ICD-10-CM

## 2016-09-16 DIAGNOSIS — J441 Chronic obstructive pulmonary disease with (acute) exacerbation: Secondary | ICD-10-CM

## 2016-09-16 DIAGNOSIS — K219 Gastro-esophageal reflux disease without esophagitis: Secondary | ICD-10-CM | POA: Diagnosis not present

## 2016-09-16 DIAGNOSIS — Z23 Encounter for immunization: Secondary | ICD-10-CM | POA: Diagnosis not present

## 2016-09-16 DIAGNOSIS — R1319 Other dysphagia: Secondary | ICD-10-CM

## 2016-09-16 DIAGNOSIS — R5383 Other fatigue: Secondary | ICD-10-CM

## 2016-09-16 DIAGNOSIS — R1310 Dysphagia, unspecified: Secondary | ICD-10-CM

## 2016-09-16 DIAGNOSIS — F3341 Major depressive disorder, recurrent, in partial remission: Secondary | ICD-10-CM

## 2016-09-16 DIAGNOSIS — W57XXXA Bitten or stung by nonvenomous insect and other nonvenomous arthropods, initial encounter: Secondary | ICD-10-CM

## 2016-09-16 DIAGNOSIS — R51 Headache: Secondary | ICD-10-CM | POA: Diagnosis not present

## 2016-09-16 LAB — CBC WITH DIFFERENTIAL/PLATELET
Basophils Absolute: 0 10*3/uL (ref 0.0–0.1)
Basophils Relative: 0.4 % (ref 0.0–3.0)
Eosinophils Absolute: 0.2 10*3/uL (ref 0.0–0.7)
Eosinophils Relative: 2.1 % (ref 0.0–5.0)
HCT: 39.5 % (ref 36.0–46.0)
Hemoglobin: 13.1 g/dL (ref 12.0–15.0)
Lymphocytes Relative: 21.5 % (ref 12.0–46.0)
Lymphs Abs: 1.6 10*3/uL (ref 0.7–4.0)
MCHC: 33.2 g/dL (ref 30.0–36.0)
MCV: 101.3 fl — ABNORMAL HIGH (ref 78.0–100.0)
Monocytes Absolute: 0.4 10*3/uL (ref 0.1–1.0)
Monocytes Relative: 5.2 % (ref 3.0–12.0)
Neutro Abs: 5.3 10*3/uL (ref 1.4–7.7)
Neutrophils Relative %: 70.8 % (ref 43.0–77.0)
Platelets: 204 10*3/uL (ref 150.0–400.0)
RBC: 3.9 Mil/uL (ref 3.87–5.11)
RDW: 12.1 % (ref 11.5–15.5)
WBC: 7.5 10*3/uL (ref 4.0–10.5)

## 2016-09-16 LAB — HEMOGLOBIN A1C: Hgb A1c MFr Bld: 6.1 % (ref 4.6–6.5)

## 2016-09-16 LAB — TSH: TSH: 0.92 u[IU]/mL (ref 0.35–4.50)

## 2016-09-16 LAB — HEPATIC FUNCTION PANEL
ALT: 17 U/L (ref 0–35)
AST: 19 U/L (ref 0–37)
Albumin: 4 g/dL (ref 3.5–5.2)
Alkaline Phosphatase: 47 U/L (ref 39–117)
Bilirubin, Direct: 0.1 mg/dL (ref 0.0–0.3)
Total Bilirubin: 0.5 mg/dL (ref 0.2–1.2)
Total Protein: 6.8 g/dL (ref 6.0–8.3)

## 2016-09-16 LAB — BASIC METABOLIC PANEL
BUN: 11 mg/dL (ref 6–23)
CO2: 27 mEq/L (ref 19–32)
Calcium: 9.4 mg/dL (ref 8.4–10.5)
Chloride: 104 mEq/L (ref 96–112)
Creatinine, Ser: 0.77 mg/dL (ref 0.40–1.20)
GFR: 79.63 mL/min (ref >60.00)
Glucose, Bld: 117 mg/dL — ABNORMAL HIGH (ref 70–99)
Potassium: 4.6 mEq/L (ref 3.5–5.1)
Sodium: 138 mEq/L (ref 135–145)

## 2016-09-16 MED ORDER — PREDNISONE 20 MG PO TABS: ORAL_TABLET | ORAL | 0 refills | Status: DC

## 2016-09-16 MED ORDER — BUPROPION HCL ER (XL) 150 MG PO TB24: 150.0000 mg | ORAL_TABLET | Freq: Every day | ORAL | 3 refills | Status: DC

## 2016-09-16 NOTE — Progress Notes (Signed)
Dr. Frederico Hamman T. Maevis Mumby, MD, [facility] [place], [address] Phone: [phone] Fax: [phone]  09/16/2016  Patient: Amanda [name], MRN: [id number], DOB: 20-Nov-1951, 65 y.o.  Primary Physician:  Owens Loffler, MD   Chief Complaint  Patient presents with  . Fatigue    Mentally and Physically "Just don't feel good"  . Cough  . Shortness of Breath  . Headache    Left side of head "feels tight & bruised  . Gastroesophageal Reflux   Subjective:   Amanda [name] is a 65 y.o. very pleasant female patient who presents with the following:  Patient presents with a multitude of complaints.  Wants to be sure that she does not have pneumonia.  She has not been breathing well over the last week or so.  She has been coughing more than normal and having shortness of breath  Nausea, having reflux.  Has needed EGD and she feels like she is having both dysphagia and odynophagia.  She has had previous strictures in her esophagus which have been dilated.  Still something on the L side - has been seeing neurology. This is well documented in both my notes and neurological notes. Neck PT next week - from Neurology.   Feels depressed and having some tightness from front to back.  Feels like something wrapped around her lung and having SOB with humidity.  Started smoking again.   We go over some extreme psychosocial stress which is ongoing relating to her husband and other family members including her son, who had been her primary support structure.  Taking Prozac.   Wants to get checked for Lyme disease.  Multiple tick bites  Past Medical History, Surgical History, Social History, Family History, Problem List, Medications, and Allergies have been reviewed and updated if relevant.  Patient Active Problem List   Diagnosis Date Noted  . Neck pain 09/04/2016  . Tension headache 02/18/2016  . Degenerative arthritis of left  knee 12/24/2015  . Left facial pain 10/29/2015  . Memory loss 10/29/2015  . Osteoarthritis Knees, bilateral, moderate 06/16/2015  . Smoker 05/10/2015  . Solitary pulmonary nodule 03/28/2013  . Cardiac murmur 03/15/2012  . Hypertension   . UNSPECIFIED VITAMIN D DEFICIENCY 05/29/2009  . Pure hypercholesterolemia 12/07/2007  . Major depressive disorder, recurrent episode, in partial remission ([place]) 12/07/2007  . Diabetes mellitus type 2, controlled ([place]) 08/10/2007    Past Medical History:  Diagnosis Date  . Anxiety   . Constipation    uses docusate and fiber  . Degenerative arthritis of knee, bilateral 06/16/2015  . Depression   . Diabetes ([place])    diet managed-type 11  . GERD (gastroesophageal reflux disease)   . Headache   . Heart murmur    slight per pt- benign   . History of tachycardia   . Hyperlipidemia   . Hypertension    hx of tachycardia   . Neuromuscular disorder ([place])    nerve damage in back and shoulder  . Pneumonia    hx of this several times  . Shortness of breath dyspnea    with exertion  . Smoker 05/10/2015  . Tachycardia   . Thyroid disease    hyperthyroidism as a teenager - received some injections age 89 that corrected this   . Tobacco abuse     Past Surgical History:  Procedure Laterality Date  . ABDOMINAL HYSTERECTOMY  1993  . BACK SURGERY    . CHOLECYSTECTOMY    .  COLONOSCOPY     30 years ago was normal per pt.   . ESOPHAGEAL DILATION     x 3  . fractured leg Right   . OVARIAN CYST REMOVAL  1972  . SALIVARY GLAND SURGERY    . TONSILLECTOMY     4 yoa  . TOTAL KNEE ARTHROPLASTY Left 12/24/2015   Procedure: LEFT TOTAL KNEE ARTHROPLASTY;  Surgeon: [name], MD;  Location: [facility];  Service: Orthopedics;  Laterality: Left;  . WRIST FRACTURE SURGERY  11/2008    Social History   Social History  . Marital status: Married    Spouse name: N/A  . Number of children: 2  . Years of education: N/A   Occupational History  .  [employer]   Social History Main Topics  . Smoking status: Current Every Day Smoker    Packs/day: 0.50    Years: 45.00    Types: Cigarettes    Last attempt to quit: 12/24/2015  . Smokeless tobacco: Never Used  . Alcohol use 0.0 oz/week     Comment: glass of wine twice a month   . Drug use: No  . Sexual activity: Not on file   Other Topics Concern  . Not on file   Social History Narrative  . No narrative on file    Family History  Problem Relation Age of Onset  . Hyperlipidemia Mother   . Arthritis Mother   . Diabetes Mother   . Heart disease Mother   . Colon polyps Mother   . Cancer Paternal Grandmother     breast  . Hypercholesterolemia Sister   . Colon polyps Sister   . Colon cancer Neg Hx   . Esophageal cancer Neg Hx   . Rectal cancer Neg Hx   . Stomach cancer Neg Hx     Allergies  Allergen Reactions  . Diazepam Other (See Comments)    REACTION: pt states she gets very angry and abusive verbally on this medication     Medication list reviewed and updated in full in [facility].   GEN: No acute illnesses, no fevers, chills. GI: No n/v/d, eating normally Pulm: No SOB Interactive and getting along well at home.  Otherwise, ROS is as per the HPI.  Objective:   BP 138/84   Pulse 79   Temp 98.2 F (36.8 C) (Oral)   Ht 5\' 8"  (1.727 m)   Wt 216 lb 8 oz (98.2 kg)   BMI 32.92 kg/m   GEN: WDWN, NAD, Non-toxic, A & O x 3 HEENT: Atraumatic, Normocephalic. Neck supple. No masses, No LAD. Ears and Nose: No external deformity. CV: RRR, No M/G/R. No JVD. No thrill. No extra heart sounds. PULM: CTA B, scattered wheezes, no crackles, rhonchi. No retractions. No resp. distress. No accessory muscle use. EXTR: No c/c/e NEURO Normal gait.  PSYCH: Normally interactive. Conversant. Not depressed or anxious appearing.  Calm demeanor.   Laboratory and Imaging Data: Results for orders placed or performed in visit  on [id number]  Basic metabolic panel  Result Value Ref Range   Sodium 138 135 - 145 mEq/L   Potassium 4.6 3.5 - 5.1 mEq/L   Chloride 104 96 - 112 mEq/L   CO2 27 19 - 32 mEq/L   Glucose, Bld 117 (H) 70 - 99 mg/dL   BUN 11 6 - 23 mg/dL   Creatinine, Ser 0.77 0.40 - 1.20 mg/dL   Calcium 9.4 8.4 - 10.5 mg/dL   GFR  79.63 >60.00 mL/min  CBC with Differential/Platelet  Result Value Ref Range   WBC 7.5 4.0 - 10.5 K/uL   RBC 3.90 3.87 - 5.11 Mil/uL   Hemoglobin 13.1 12.0 - 15.0 g/dL   HCT 39.5 36.0 - 46.0 %   MCV 101.3 (H) 78.0 - 100.0 fl   MCHC 33.2 30.0 - 36.0 g/dL   RDW 12.1 11.5 - 15.5 %   Platelets 204.0 150.0 - 400.0 K/uL   Neutrophils Relative % 70.8 43.0 - 77.0 %   Lymphocytes Relative 21.5 12.0 - 46.0 %   Monocytes Relative 5.2 3.0 - 12.0 %   Eosinophils Relative 2.1 0.0 - 5.0 %   Basophils Relative 0.4 0.0 - 3.0 %   Neutro Abs 5.3 1.4 - 7.7 K/uL   Lymphs Abs 1.6 0.7 - 4.0 K/uL   Monocytes Absolute 0.4 0.1 - 1.0 K/uL   Eosinophils Absolute 0.2 0.0 - 0.7 K/uL   Basophils Absolute 0.0 0.0 - 0.1 K/uL  Hepatic function panel  Result Value Ref Range   Total Bilirubin 0.5 0.2 - 1.2 mg/dL   Bilirubin, Direct 0.1 0.0 - 0.3 mg/dL   Alkaline Phosphatase 47 39 - 117 U/L   AST 19 0 - 37 U/L   ALT 17 0 - 35 U/L   Total Protein 6.8 6.0 - 8.3 g/dL   Albumin 4.0 3.5 - 5.2 g/dL  TSH  Result Value Ref Range   TSH 0.92 0.35 - 4.50 uIU/mL  Lyme Ab/Western Blot Reflex  Result Value Ref Range   B burgdorferi Ab IgG+IgM <0.90 <0.90 Index  Hemoglobin A1c  Result Value Ref Range   Hgb A1c MFr Bld 6.1 4.6 - 6.5 %     Assessment and Plan:   COPD exacerbation (HCC)  Major depressive disorder, recurrent episode, in partial remission (HCC)  Esophageal dysphagia - Plan: Ambulatory referral to Gastroenterology  Odynophagia - Plan: Ambulatory referral to Gastroenterology  Gastroesophageal reflux disease, esophagitis presence not specified  Other fatigue - Plan: CBC with  Differential/Platelet, Hepatic function panel, TSH  Controlled type 2 diabetes mellitus without complication, without long-term current use of insulin ([place]) - Plan: Basic metabolic panel, Hemoglobin A1c  Tick bite, initial encounter - Plan: Lyme Ab/Western Blot Reflex  Need for prophylactic vaccination and inoculation against influenza - Plan: Flu Vaccine QUAD 36+ mos IM  Multiple ongoing issues. COPD exacerbation.  Prednisone.   Recommended discontinuation of smoking.  Significantly depressed.  Long-term history of depression for many years.  At wellbutrin.  GERD - bid PPI ddysphagia and odynophagia, consult GI.  Follow-up: 6 weeks  New Prescriptions   BUPROPION (WELLBUTRIN XL) 150 MG 24 HR TABLET    Take 1 tablet (150 mg total) by mouth daily.   PREDNISONE (DELTASONE) 20 MG TABLET    2 tabs po for 4 days, then 1 tab po for 3 days   Modified Medications   Modified Medication Previous Medication   ESOMEPRAZOLE (NEXIUM) 40 MG CAPSULE esomeprazole (NEXIUM) 40 MG capsule      Take 1 capsule (40 mg total) by mouth 2 (two) times daily before a meal.    Take 1 capsule (40 mg total) by mouth daily before breakfast.   Orders Placed This Encounter  Procedures  . Flu Vaccine QUAD 36+ mos IM  . Basic metabolic panel  . CBC with Differential/Platelet  . Hepatic function panel  . TSH  . Lyme Ab/Western Blot Reflex  . Hemoglobin A1c  . Ambulatory referral to Gastroenterology    Signed,  Jens Siems T. Cornelius Schuitema, MD   Patient's Medications  New Prescriptions   BUPROPION (WELLBUTRIN XL) 150 MG 24 HR TABLET    Take 1 tablet (150 mg total) by mouth daily.   PREDNISONE (DELTASONE) 20 MG TABLET    2 tabs po for 4 days, then 1 tab po for 3 days  Previous Medications   AMITRIPTYLINE (ELAVIL) 50 MG TABLET    Take 2 tablets at night   ASCORBIC ACID (VITAMIN C) 1000 MG TABLET    Take 1,000 mg by mouth daily.   ASPIRIN 81 MG TABLET    Take 81 mg by mouth daily.   ATORVASTATIN (LIPITOR) 40 MG  TABLET    TAKE ONE TABLET BY MOUTH ONCE DAILY   CHOLECALCIFEROL (VITAMIN D) 1000 UNITS CAPSULE    Take 1,000 Units by mouth 2 (two) times daily.    CLONAZEPAM (KLONOPIN) 0.5 MG TABLET    Take 1 tablet (0.5 mg total) by mouth 2 (two) times daily as needed.   CYCLOBENZAPRINE (FLEXERIL) 10 MG TABLET    TAKE ONE TABLET BY MOUTH THREE TIMES DAILY AS NEEDED FOR MUSCLE SPASM   DIPHENHYDRAMINE (BENADRYL) 25 MG CAPSULE    Take 25 mg by mouth 2 (two) times daily.   FLUOXETINE (PROZAC) 40 MG CAPSULE    TAKE TWO CAPSULES BY MOUTH ONCE DAILY   MAGNESIUM OXIDE (MAG-OX) 400 MG TABLET    Take 400 mg by mouth daily.   METOPROLOL TARTRATE (LOPRESSOR) 25 MG TABLET    Take 1 tablet (25 mg total) by mouth 2 (two) times daily.   MULTIPLE VITAMIN (MULTIVITAMIN) TABLET    Take 1 tablet by mouth daily.   NAPROXEN (NAPROSYN) 125 MG/5ML SUSPENSION    Take by mouth 2 (two) times daily with a meal.   PSYLLIUM (METAMUCIL PO)    Take 1 capsule by mouth daily.  Modified Medications   Modified Medication Previous Medication   ESOMEPRAZOLE (NEXIUM) 40 MG CAPSULE esomeprazole (NEXIUM) 40 MG capsule      Take 1 capsule (40 mg total) by mouth 2 (two) times daily before a meal.    Take 1 capsule (40 mg total) by mouth daily before breakfast.  Discontinued Medications   OXYCODONE-ACETAMINOPHEN (PERCOCET/ROXICET) 5-325 MG TABLET    Take by mouth every 6 (six) hours as needed for severe pain. Reported on 05/12/2016

## 2016-09-16 NOTE — Progress Notes (Signed)
Pre visit review using our clinic review tool, if applicable. No additional management support is needed unless otherwise documented below in the visit note. 

## 2016-09-17 ENCOUNTER — Encounter: Payer: Self-pay | Admitting: Gastroenterology

## 2016-09-17 ENCOUNTER — Ambulatory Visit (INDEPENDENT_AMBULATORY_CARE_PROVIDER_SITE_OTHER): Payer: Medicare Other | Admitting: Gastroenterology

## 2016-09-17 VITALS — BP 132/82 | HR 76 | Ht 68.0 in | Wt 217.0 lb

## 2016-09-17 DIAGNOSIS — R1319 Other dysphagia: Secondary | ICD-10-CM | POA: Diagnosis not present

## 2016-09-17 DIAGNOSIS — K219 Gastro-esophageal reflux disease without esophagitis: Secondary | ICD-10-CM | POA: Diagnosis not present

## 2016-09-17 DIAGNOSIS — R1013 Epigastric pain: Secondary | ICD-10-CM | POA: Diagnosis not present

## 2016-09-17 LAB — LYME AB/WESTERN BLOT REFLEX: B burgdorferi Ab IgG+IgM: <0.9 Index (ref <0.90)

## 2016-09-17 MED ORDER — ESOMEPRAZOLE MAGNESIUM 40 MG PO CPDR: 40.0000 mg | DELAYED_RELEASE_CAPSULE | Freq: Two times a day (BID) | ORAL | 3 refills | Status: DC

## 2016-09-17 NOTE — Patient Instructions (Signed)
We sent refills to [facility]. Nexium 40.  You have been scheduled for an endoscopy. Please follow written instructions given to you at your visit today. If you use inhalers (even only as needed), please bring them with you on the day of your procedure. Your physician has requested that you go to www.[email or web address].com and enter the access code given to you at your visit today. This web site gives a general overview about your procedure. However, you should still follow specific instructions given to you by our office regarding your preparation for the procedure.

## 2016-09-17 NOTE — Progress Notes (Signed)
I agree with the above note, plan 

## 2016-09-17 NOTE — Progress Notes (Signed)
     09/17/2016 Amanda [name] [id number] [birth year]   History of Present Illness:  This is a pleasant 65 year old female who is known to Dr. Ardis Hughs for colonoscopy in 11/2015.  She presents to our office today with complaints of difficulty swallowing and worsening acid reflux with epigastric abdominal pain.  She says that she's had her esophagus stretched in the past, but the last time was probably 20 years or so ago, and we do not have record of that.  She is currently on Nexium 40 mg daily for GERD but says that she is getting a lot of heartburn/indigestion despite that medication and is having to use a lot of Tums; particularly having issues at night.  Says that the issues with swallowing have been worse over the past 2-3 months.  No issues with swallowing liquids.  Dysphagia occurs with beef, french fries, etc.  Current Medications, Allergies, Past Medical History, Past Surgical History, Family History and Social History were reviewed in [employer] record.   Physical Exam: BP 132/82   Pulse 76   Ht 5\' 8"  (1.727 m)   Wt 217 lb (98.4 kg)   BMI 32.99 kg/m  General: Well developed white female in no acute distress Head: Normocephalic and atraumatic Eyes:  Sclerae anicteric, conjunctiva pink  Ears: Normal auditory acuity Lungs: Clear throughout to auscultation Heart: Regular rate and rhythm Abdomen: Soft, non-distended.  Normal bowel sounds.  Mild epigastric TTP. Musculoskeletal: Symmetrical with no gross deformities  Extremities: No edema  Neurological: Alert oriented x 4, grossly non-focal Psychological:  Alert and cooperative. Normal mood and affect  Assessment and Recommendations: -Dysphagia to solid food with worsening GERD and epigastric abdominal pain:  Will schedule for EGD with possible dilation with Dr. Ardis Hughs.  The risks, benefits, and alternatives to EGD with dilation were discussed with the patient, and she consents to proceed.  Will increase her  Nexium to 40 mg BID in there interim as well.  ? Peptic stricture from worsening uncontrolled reflux, esophagitis, etc.

## 2016-09-22 ENCOUNTER — Ambulatory Visit: Payer: Medicare Other | Admitting: Physical Therapy

## 2016-09-22 DIAGNOSIS — M542 Cervicalgia: Secondary | ICD-10-CM

## 2016-09-22 NOTE — Therapy (Signed)
[facility] [address] [place], [address] Phone: [phone]   Fax:  [phone]  Physical Therapy Treatment  Patient Details  Name: Amanda [name] MRN: [id number] Date of Birth: Feb 14, 1951 Referring Provider: Ellouise Newer, MD  Encounter Date: 09/22/2016      PT End of Session - 09/22/16 1538    Visit Number 2   Number of Visits 13   Date for PT Re-Evaluation 10/26/16   PT Start Time 7209   PT Stop Time 1610   PT Time Calculation (min) 33 min   Activity Tolerance Patient tolerated treatment well   Behavior During Therapy [facility] for tasks assessed/performed      Past Medical History:  Diagnosis Date  . Anxiety   . Constipation    uses docusate and fiber  . Degenerative arthritis of knee, bilateral 06/16/2015  . Depression   . Diabetes ([place])    diet managed-type 11  . GERD (gastroesophageal reflux disease)   . Headache   . Heart murmur    slight per pt- benign   . History of tachycardia   . Hyperlipidemia   . Hypertension    hx of tachycardia   . 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 Pneumonia    hx of this several times  . Shortness of breath dyspnea    with exertion  . Smoker 05/10/2015  . Tachycardia   . Thyroid disease    hyperthyroidism as a teenager - received some injections age 50 that corrected this   . Tobacco abuse     Past Surgical History:  Procedure Laterality Date  . ABDOMINAL HYSTERECTOMY  1993  . BACK SURGERY    . CHOLECYSTECTOMY    . COLONOSCOPY     30 years ago was normal per pt.   . ESOPHAGEAL DILATION     x 3  . fractured leg Right   . OVARIAN CYST REMOVAL  1972  . SALIVARY GLAND SURGERY    . TONSILLECTOMY     4 yoa  . TOTAL KNEE ARTHROPLASTY Left 12/24/2015   Procedure: LEFT TOTAL KNEE ARTHROPLASTY;  Surgeon: [name], MD;  Location: [facility];  Service: Orthopedics;  Laterality: Left;  . WRIST FRACTURE SURGERY   11/2008    There were no vitals filed for this visit.      Subjective Assessment - 09/22/16 1537    Subjective Patient reports she had some relief when able to complete HEP, however she has had life issues arise recently and has not been as consistent over the weekend. She reports her pain is currently only with end range rotation bilaterally from L levator scapulae down through medial border of L scapula.    Pertinent History Pt reports having neck, shoulder, shoulder blade pain for at least 1 year. She states the the whole left side of her head feels sore "like it's bruised." She states she gets "prickly feelings in her temples." She had a CT scan which was negative and states that her neurologist told her she thinks it's more of a nerve problem since the CT scan didn't show anything. She reports that her medication helps the burning, stinging, prickly feeling. She states if her neck is really bothering her, she will have n/t in her LUE but it does not go past her elbow. She reports having headaches every day and if it is a severe headache, she reports she will get slightly  blurred vision in her L eye. Most days it is better in the AM and gets worse as the day goes on. She denies B/B changing but does report increased nausea but she attributes that to her stomach. She reports rest and rubbing on the L side of her face make it feel better. This pain does not affect her sleep once she is asleep; sleeping on her L side is the most comfortable. She has not tried ice or heat. She can't provide any aggravating movements for her neck pain. She reports having a really bad fall 3 years ago when she tripped on a doorstop, fell onto her knees and ended up hitting her head on 2 doors; her neck and headaches began after that fall. She is right handed. She reports she joined silver sneakers and she is going to begin working out at the hospital.   Patient Stated Goals decrease pain    Currently in Pain? --  Only with  rotations (end range) L>R.   Pain Onset --      Patient assessed in supine, pain was consistent with levator scapulae, PT had patient sit up and performed extensive soft tissue mobilization to levator scapulae in L distal cervical region and extending through thoracic spine to medial border of scapula. Patient reported multiple tender spots throughout this region, reported that she felt this was recreating her symptoms, and relieving with decreased pressure applied.   Educated patient on levator scapulae stretch x 8 for 5" holds for 2 sets -- reassessed ROM - no pain and full ROM into R rotation, with L rotation reported decreased pain, though still present and PT noted ~ 25% decrease in ROM.                              PT Education - 09/22/16 1628    Education provided Yes   Education Details Perform levator scap stretch.    Person(s) Educated Patient   Methods Explanation;Handout   Comprehension Verbalized understanding             PT Long Term Goals - 09/14/16 1512      PT LONG TERM GOAL #1   Title Pt will demonstrate improved cervical rotation AROM by 10 deg bilaterally and pain-free to demonstrate improved overall function.   Baseline R rotation: 60 deg; L rotation: 55 deg with increased pain on L   Time 6   Period Weeks     PT LONG TERM GOAL #2   Title Pt will be independent with HEP to promote return to function and decrease risk of reinjury.   Time 6   Period Weeks   Status New     PT LONG TERM GOAL #3   Title Pt will be able to correct and demonstrate proper sitting posture to decrease pain and improve overall function.   Time 6   Period Weeks   Status New               Plan - 09/22/16 1539    Clinical Impression Statement Patient reporting decreased pain after this session with R sided rotation through full range, however she continues to have limited L rotation with pain at end range. Reports pain and tenderness with palpation  along the levator scapulae down through the medial border of the scapulae. Reports levator stretch provided and soft tissue mobilization were beneficial in this session.. Patient provided with levator scapulae stretch for HEP.  Rehab Potential Fair   Clinical Impairments Affecting Rehab Potential co-morbidities, time since onset, relatively sedentary lifestyle and improper posture   PT Frequency 2x / week   PT Duration 6 weeks   PT Treatment/Interventions ADLs/Self Care Home Management;Electrical Stimulation;Moist Heat;Traction;Functional mobility training;Therapeutic activities;Therapeutic exercise;Neuromuscular re-education;Patient/family education;Manual techniques;Passive range of motion;Dry needling;Taping   PT Next Visit Plan suboccipital MET, manual UT/LS/scalene stretch, soft tissue mobilization, thoracic/cervical mobilizations   PT Home Exercise Plan UT and levator scap stretch, cervical retractions   Consulted and Agree with Plan of Care Patient      Patient will benefit from skilled therapeutic intervention in order to improve the following deficits and impairments:  Decreased mobility, Decreased range of motion, Hypomobility, Increased muscle spasms, Improper body mechanics, Postural dysfunction, Obesity, Pain  Visit Diagnosis: Cervicalgia     Problem List Patient Active Problem List   Diagnosis Date Noted  . Other dysphagia 09/17/2016  . Gastroesophageal reflux disease 09/17/2016  . Abdominal pain, epigastric 09/17/2016  . Neck pain 09/04/2016  . Tension headache 02/18/2016  . Degenerative arthritis of left knee 12/24/2015  . Left facial pain 10/29/2015  . Memory loss 10/29/2015  . Osteoarthritis Knees, bilateral, moderate 06/16/2015  . Smoker 05/10/2015  . Solitary pulmonary nodule 03/28/2013  . Cardiac murmur 03/15/2012  . Hypertension   . UNSPECIFIED VITAMIN D DEFICIENCY 05/29/2009  . Pure hypercholesterolemia 12/07/2007  . Major depressive disorder, recurrent  episode, in partial remission ([place]) 12/07/2007  . Diabetes mellitus type 2, controlled ([place]) 08/10/2007   Kerman Passey, PT, DPT    09/22/2016, 4:31 PM  [facility] [address] [place], [address] Phone: [phone]   Fax:  [phone]  Name: Amanda [name] MRN: [id number] Date of Birth: Sep 02, 1951

## 2016-09-24 ENCOUNTER — Encounter: Payer: Self-pay | Admitting: Physical Therapy

## 2016-09-24 ENCOUNTER — Ambulatory Visit: Payer: Medicare Other | Admitting: Physical Therapy

## 2016-09-24 DIAGNOSIS — M542 Cervicalgia: Secondary | ICD-10-CM

## 2016-09-24 NOTE — Therapy (Signed)
[facility] [address], [place], [address] Phone: [phone]   Fax:  [phone]  Physical Therapy Treatment  Patient Details  Name: Amanda [name] MRN: [id number] Date of Birth: 1951-03-11 Referring Provider: Ellouise Newer, MD  Encounter Date: 09/24/2016      PT End of Session - 09/24/16 1629    Visit Number 3   Number of Visits 13   Date for PT Re-Evaluation 10/26/16   PT Start Time 4037   PT Stop Time 1615   PT Time Calculation (min) 45 min   Activity Tolerance Patient tolerated treatment well   Behavior During Therapy [facility] for tasks assessed/performed      Past Medical History:  Diagnosis Date  . Anxiety   . Constipation    uses docusate and fiber  . Degenerative arthritis of knee, bilateral 06/16/2015  . Depression   . Diabetes ([place])    diet managed-type 11  . GERD (gastroesophageal reflux disease)   . Headache   . Heart murmur    slight per pt- benign   . History of tachycardia   . Hyperlipidemia   . Hypertension    hx of tachycardia   . 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 Pneumonia    hx of this several times  . Shortness of breath dyspnea    with exertion  . Smoker 05/10/2015  . Tachycardia   . Thyroid disease    hyperthyroidism as a teenager - received some injections age 70 that corrected this   . Tobacco abuse     Past Surgical History:  Procedure Laterality Date  . ABDOMINAL HYSTERECTOMY  1993  . BACK SURGERY    . CHOLECYSTECTOMY    . COLONOSCOPY     30 years ago was normal per pt.   . ESOPHAGEAL DILATION     x 3  . fractured leg Right   . OVARIAN CYST REMOVAL  1972  . SALIVARY GLAND SURGERY    . TONSILLECTOMY     4 yoa  . TOTAL KNEE ARTHROPLASTY Left 12/24/2015   Procedure: LEFT TOTAL KNEE ARTHROPLASTY;  Surgeon: [name], MD;  Location: [place] [facility];  Service: Orthopedics;  Laterality: Left;  . WRIST FRACTURE SURGERY   11/2008    There were no vitals filed for this visit.      Subjective Assessment - 09/24/16 1532    Subjective Pt reports that her bursitis in her R shoulder has been acting up due to performing her levator scap stretch, but states that the stretch did help as she could tell a difference.   Pertinent History Pt reports having neck, shoulder, shoulder blade pain for at least 1 year. She states the the whole left side of her head feels sore "like it's bruised." She states she gets "prickly feelings in her temples." She had a CT scan which was negative and states that her neurologist told her she thinks it's more of a nerve problem since the CT scan didn't show anything. She reports that her medication helps the burning, stinging, prickly feeling. She states if her neck is really bothering her, she will have n/t in her LUE but it does not go past her elbow. She reports having headaches every day and if it is a severe headache, she reports she will get slightly blurred vision in her L eye. Most days it is better in the AM and gets worse as the  day goes on. She denies B/B changing but does report increased nausea but she attributes that to her stomach. She reports rest and rubbing on the L side of her face make it feel better. This pain does not affect her sleep once she is asleep; sleeping on her L side is the most comfortable. She has not tried ice or heat. She can't provide any aggravating movements for her neck pain. She reports having a really bad fall 3 years ago when she tripped on a doorstop, fell onto her knees and ended up hitting her head on 2 doors; her neck and headaches began after that fall. She is right handed. She reports she joined silver sneakers and she is going to begin working out at the hospital.   Patient Stated Goals decrease pain    Currently in Pain? No/denies      Cervical AROM at beginning of session R rotation: 82 deg, slight pulling at L shoulder blade at end range L  rotation: 72 deg, pain at end range  MANUAL Cross friction and efflurage soft tissue mobilization to L levator scap x 15 mins; pt reported decreased TTP with prolonged soft tissue mobilization  Grade 2/3 L unilateral PAs T2-T8, 2x30-60 sec bouts at each segment; pt most TTP at T4; pt reported decreased tenderness following prolonged bouts   Cervical rotation following manual therapy: R rotation WNL, non-painful following manual L rotation: 76 deg, tightness at end range   THEREX Bil scap retraction on OMEGA x 15#, 3x10; cues for proper technique  Pt reported that levator scap stretch aggravated her R shoulder, so SPT and PT modified stretch and had pt use towel around head to apply downward pressure and pt reported relief of R shoulder and good stretch of L levator scap        PT Long Term Goals - 09/14/16 1512      PT LONG TERM GOAL #1   Title Pt will demonstrate improved cervical rotation AROM by 10 deg bilaterally and pain-free to demonstrate improved overall function.   Baseline R rotation: 60 deg; L rotation: 55 deg with increased pain on L   Time 6   Period Weeks     PT LONG TERM GOAL #2   Title Pt will be independent with HEP to promote return to function and decrease risk of reinjury.   Time 6   Period Weeks   Status New     PT LONG TERM GOAL #3   Title Pt will be able to correct and demonstrate proper sitting posture to decrease pain and improve overall function.   Time 6   Period Weeks   Status New               Plan - 09/24/16 1629    Clinical Impression Statement Pt making progress towards goals as her cervical ROM is improving with decreased pain. Pt continues with slightly limited AROM with L rotation and pain at end range. Pt tolerated manual well this date with improved symptoms following. She tolerated periscapular strengthening well with no c/o pain.    Rehab Potential Fair   Clinical Impairments Affecting Rehab Potential co-morbidities, time  since onset, relatively sedentary lifestyle and improper posture   PT Frequency 2x / week   PT Duration 6 weeks   PT Treatment/Interventions ADLs/Self Care Home Management;Electrical Stimulation;Moist Heat;Traction;Functional mobility training;Therapeutic activities;Therapeutic exercise;Neuromuscular re-education;Patient/family education;Manual techniques;Passive range of motion;Dry needling;Taping   PT Next Visit Plan suboccipital MET, manual UT/LS/scalene stretch, soft tissue mobilization, thoracic/cervical  mobilizations   PT Home Exercise Plan UT and levator scap stretch, cervical retractions   Consulted and Agree with Plan of Care Patient      Patient will benefit from skilled therapeutic intervention in order to improve the following deficits and impairments:  Decreased mobility, Decreased range of motion, Hypomobility, Increased muscle spasms, Improper body mechanics, Postural dysfunction, Obesity, Pain  Visit Diagnosis: Cervicalgia     Problem List Patient Active Problem List   Diagnosis Date Noted  . Other dysphagia 09/17/2016  . Gastroesophageal reflux disease 09/17/2016  . Abdominal pain, epigastric 09/17/2016  . Neck pain 09/04/2016  . Tension headache 02/18/2016  . Degenerative arthritis of left knee 12/24/2015  . Left facial pain 10/29/2015  . Memory loss 10/29/2015  . Osteoarthritis Knees, bilateral, moderate 06/16/2015  . Smoker 05/10/2015  . Solitary pulmonary nodule 03/28/2013  . Cardiac murmur 03/15/2012  . Hypertension   . UNSPECIFIED VITAMIN D DEFICIENCY 05/29/2009  . Pure hypercholesterolemia 12/07/2007  . Major depressive disorder, recurrent episode, in partial remission ([place]) 12/07/2007  . Diabetes mellitus type 2, controlled ([place]) 08/10/2007   Geraldine Solar, SPT  Geraldine Solar 09/24/2016, 4:32 PM  [facility] [address] [place], [address] Phone: [phone]   Fax:   [phone]  Name: AMAURIE SCHRECKENGOST MRN: [id number] Date of Birth: 1951/04/01

## 2016-09-29 ENCOUNTER — Encounter: Payer: Self-pay | Admitting: Physical Therapy

## 2016-09-29 ENCOUNTER — Ambulatory Visit: Payer: Medicare Other | Admitting: Physical Therapy

## 2016-09-29 DIAGNOSIS — M542 Cervicalgia: Secondary | ICD-10-CM

## 2016-09-29 NOTE — Therapy (Signed)
[facility] [address] [place], [address] Phone: [phone]   Fax:  [phone]  Physical Therapy Treatment  Patient Details  Name: Amanda [name] MRN: [id number] Date of Birth: October 31, 1951 Referring Provider: Ellouise Newer, MD  Encounter Date: 09/29/2016      PT End of Session - 09/29/16 1619    Visit Number 4   Number of Visits 13   Date for PT Re-Evaluation 10/26/16   PT Start Time 1532   PT Stop Time 1615   PT Time Calculation (min) 43 min   Activity Tolerance Patient tolerated treatment well   Behavior During Therapy [facility] for tasks assessed/performed      Past Medical History:  Diagnosis Date  . Anxiety   . Constipation    uses docusate and fiber  . Degenerative arthritis of knee, bilateral 06/16/2015  . Depression   . Diabetes ([place])    diet managed-type 11  . GERD (gastroesophageal reflux disease)   . Headache   . Heart murmur    slight per pt- benign   . History of tachycardia   . Hyperlipidemia   . Hypertension    hx of tachycardia   . 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 Pneumonia    hx of this several times  . Shortness of breath dyspnea    with exertion  . Smoker 05/10/2015  . Tachycardia   . Thyroid disease    hyperthyroidism as a teenager - received some injections age 68 that corrected this   . Tobacco abuse     Past Surgical History:  Procedure Laterality Date  . ABDOMINAL HYSTERECTOMY  1993  . BACK SURGERY    . CHOLECYSTECTOMY    . COLONOSCOPY     30 years ago was normal per pt.   . ESOPHAGEAL DILATION     x 3  . fractured leg Right   . OVARIAN CYST REMOVAL  1972  . SALIVARY GLAND SURGERY    . TONSILLECTOMY     4 yoa  . TOTAL KNEE ARTHROPLASTY Left 12/24/2015   Procedure: LEFT TOTAL KNEE ARTHROPLASTY;  Surgeon: [name], MD;  Location: [facility];  Service: Orthopedics;  Laterality: Left;  . WRIST FRACTURE SURGERY   11/2008    There were no vitals filed for this visit.      Subjective Assessment - 09/29/16 1534    Subjective Pt states that her neck started bothering her a little bit yesterday when she was helping her daughter move dishes in and out of counters, but it feels good today.   Pertinent History Pt reports having neck, shoulder, shoulder blade pain for at least 1 year. She states the the whole left side of her head feels sore "like it's bruised." She states she gets "prickly feelings in her temples." She had a CT scan which was negative and states that her neurologist told her she thinks it's more of a nerve problem since the CT scan didn't show anything. She reports that her medication helps the burning, stinging, prickly feeling. She states if her neck is really bothering her, she will have n/t in her LUE but it does not go past her elbow. She reports having headaches every day and if it is a severe headache, she reports she will get slightly blurred vision in her L eye. Most days it is better in the AM and gets worse as the day goes on.  She denies B/B changing but does report increased nausea but she attributes that to her stomach. She reports rest and rubbing on the L side of her face make it feel better. This pain does not affect her sleep once she is asleep; sleeping on her L side is the most comfortable. She has not tried ice or heat. She can't provide any aggravating movements for her neck pain. She reports having a really bad fall 3 years ago when she tripped on a doorstop, fell onto her knees and ended up hitting her head on 2 doors; her neck and headaches began after that fall. She is right handed. She reports she joined silver sneakers and she is going to begin working out at the hospital.   Patient Stated Goals decrease pain    Currently in Pain? No/denies     Cervical AROM at beginning of session:  R rotation: 74 deg L rotation: 71 deg   MANUAL Cross friction, efflurage, and muscle  stripping to L levator scap x 20 mins; pt had increased TTP at origin and insertion point of levator scap which decreased following prolonged manual  Grade 2-3 CPAs and UPAs T2-T8, 3x30-45 sec bouts for CPAs and 2x30-45 sec bouts for UPAs; pt had increased TTP of T2-T3 with UPA which decreased following prolonged bout   Cervical AROM following manual therapy: R rotation: 83 deg  L rotation: 86 deg    Given periscapular strengthening exercises for HEP; see pt instructions       PT Education - 09/29/16 1618    Education provided Yes   Education Details continue HEP, added periscapular strengthening to HEP   Person(s) Educated Patient   Methods Explanation   Comprehension Verbalized understanding             PT Long Term Goals - 09/14/16 1512      PT LONG TERM GOAL #1   Title Pt will demonstrate improved cervical rotation AROM by 10 deg bilaterally and pain-free to demonstrate improved overall function.   Baseline R rotation: 60 deg; L rotation: 55 deg with increased pain on L   Time 6   Period Weeks     PT LONG TERM GOAL #2   Title Pt will be independent with HEP to promote return to function and decrease risk of reinjury.   Time 6   Period Weeks   Status New     PT LONG TERM GOAL #3   Title Pt will be able to correct and demonstrate proper sitting posture to decrease pain and improve overall function.   Time 6   Period Weeks   Status New               Plan - 09/29/16 1619    Clinical Impression Statement Pt reported having slight increased pain in her neck over the weekend when she was helping her daughter move dishes into and out of kitchen cabinets. She continues with hypomobility of thoracic spine and increased soft tissue restriction of L levator scap and thoracic erector spinae. Pt tolerated manual therapy well and demonstrated improved AROM following. Pt needs contiued skilled PT intervention to maximize overall function.   Rehab Potential Fair    Clinical Impairments Affecting Rehab Potential co-morbidities, time since onset, relatively sedentary lifestyle and improper posture   PT Frequency 2x / week   PT Duration 6 weeks   PT Treatment/Interventions ADLs/Self Care Home Management;Electrical Stimulation;Moist Heat;Traction;Functional mobility training;Therapeutic activities;Therapeutic exercise;Neuromuscular re-education;Patient/family education;Manual techniques;Passive range of motion;Dry needling;Taping  PT Next Visit Plan suboccipital MET, manual UT/LS/scalene stretch, soft tissue mobilization, thoracic/cervical mobilizations   PT Home Exercise Plan UT and levator scap stretch, cervical retractions   Consulted and Agree with Plan of Care Patient      Patient will benefit from skilled therapeutic intervention in order to improve the following deficits and impairments:  Decreased mobility, Decreased range of motion, Hypomobility, Increased muscle spasms, Improper body mechanics, Postural dysfunction, Obesity, Pain  Visit Diagnosis: Cervicalgia     Problem List Patient Active Problem List   Diagnosis Date Noted  . Other dysphagia 09/17/2016  . Gastroesophageal reflux disease 09/17/2016  . Abdominal pain, epigastric 09/17/2016  . Neck pain 09/04/2016  . Tension headache 02/18/2016  . Degenerative arthritis of left knee 12/24/2015  . Left facial pain 10/29/2015  . Memory loss 10/29/2015  . Osteoarthritis Knees, bilateral, moderate 06/16/2015  . Smoker 05/10/2015  . Solitary pulmonary nodule 03/28/2013  . Cardiac murmur 03/15/2012  . Hypertension   . UNSPECIFIED VITAMIN D DEFICIENCY 05/29/2009  . Pure hypercholesterolemia 12/07/2007  . Major depressive disorder, recurrent episode, in partial remission ([place]) 12/07/2007  . Diabetes mellitus type 2, controlled ([place]) 08/10/2007   Geraldine Solar, SPT  Geraldine Solar 09/29/2016, 4:22 PM  [facility] [address] [place], [address] Phone: [phone]   Fax:  [phone]  Name: Amanda [name] MRN: [id number] Date of Birth: [birth year]

## 2016-09-29 NOTE — Patient Instructions (Signed)
[email or web address]  bil scap retraction and low rows with RTB

## 2016-10-01 ENCOUNTER — Ambulatory Visit: Payer: Medicare Other | Admitting: Physical Therapy

## 2016-10-01 DIAGNOSIS — M542 Cervicalgia: Secondary | ICD-10-CM

## 2016-10-01 NOTE — Patient Instructions (Signed)
Joint mobilizations   PNF   Soft tissue mobilization

## 2016-10-05 NOTE — Therapy (Signed)
[facility] [address] [place], [address] Phone: [phone]   Fax:  [phone]  Physical Therapy Treatment  Patient Details  Name: Amanda [name] MRN: [id number] Date of Birth: 1951/03/16 Referring Provider: Ellouise Newer, MD  Encounter Date: 10/01/2016      PT End of Session - 10/05/16 0942    Visit Number 5   Number of Visits 13   Date for PT Re-Evaluation 10/26/16   PT Start Time 0093   PT Stop Time 1100   PT Time Calculation (min) 45 min   Activity Tolerance Patient tolerated treatment well   Behavior During Therapy [facility] for tasks assessed/performed      Past Medical History:  Diagnosis Date  . Anxiety   . Constipation    uses docusate and fiber  . Degenerative arthritis of knee, bilateral 06/16/2015  . Depression   . Diabetes ([place])    diet managed-type 11  . GERD (gastroesophageal reflux disease)   . Headache   . Heart murmur    slight per pt- benign   . History of tachycardia   . Hyperlipidemia   . Hypertension    hx of tachycardia   . 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 Pneumonia    hx of this several times  . Shortness of breath dyspnea    with exertion  . Smoker 05/10/2015  . Tachycardia   . Thyroid disease    hyperthyroidism as a teenager - received some injections age 70 that corrected this   . Tobacco abuse     Past Surgical History:  Procedure Laterality Date  . ABDOMINAL HYSTERECTOMY  1993  . BACK SURGERY    . CHOLECYSTECTOMY    . COLONOSCOPY     30 years ago was normal per pt.   . ESOPHAGEAL DILATION     x 3  . fractured leg Right   . OVARIAN CYST REMOVAL  1972  . SALIVARY GLAND SURGERY    . TONSILLECTOMY     4 yoa  . TOTAL KNEE ARTHROPLASTY Left 12/24/2015   Procedure: LEFT TOTAL KNEE ARTHROPLASTY;  Surgeon: [name], MD;  Location: [facility];  Service: Orthopedics;  Laterality: Left;  . WRIST FRACTURE SURGERY   11/2008    There were no vitals filed for this visit.      Subjective Assessment - 10/05/16 0945    Subjective Patient reports it is a little tender along the shoulder blade, she is noticing some pain reduction from therapy as well as increased ROM.    Pertinent History Pt reports having neck, shoulder, shoulder blade pain for at least 1 year. She states the the whole left side of her head feels sore "like it's bruised." She states she gets "prickly feelings in her temples." She had a CT scan which was negative and states that her neurologist told her she thinks it's more of a nerve problem since the CT scan didn't show anything. She reports that her medication helps the burning, stinging, prickly feeling. She states if her neck is really bothering her, she will have n/t in her LUE but it does not go past her elbow. She reports having headaches every day and if it is a severe headache, she reports she will get slightly blurred vision in her L eye. Most days it is better in the AM and gets worse as the day goes on. She denies B/B changing but  does report increased nausea but she attributes that to her stomach. She reports rest and rubbing on the L side of her face make it feel better. This pain does not affect her sleep once she is asleep; sleeping on her L side is the most comfortable. She has not tried ice or heat. She can't provide any aggravating movements for her neck pain. She reports having a really bad fall 3 years ago when she tripped on a doorstop, fell onto her knees and ended up hitting her head on 2 doors; her neck and headaches began after that fall. She is right handed. She reports she joined silver sneakers and she is going to begin working out at the hospital.   Patient Stated Goals decrease pain    Currently in Pain? Yes   Pain Score --  Patient reports as mild to moderate along L medial border of scapula   Pain Onset More than a month ago   Pain Frequency Intermittent         Joint mobilizations CPAs grade III and UPAs to L cervical spine, tender initially (reported more stiffness sensation) however she tolerated well. 3 bouts on each segment from C2 through ~ T2 well tolerated with reported decrease in stiffness. No obvious change in ROM with cervical rotation noted.   PNF stretching to L rotation x 5 bouts with 5" holds with tenderness initially, however reported less stiffness with rotation afterwards.   Soft tissue mobilization -- to levator scapulae and along medial border of scapula with reported trigger points and relief of symptoms, though continues to have decreased L cervical rotation.                           PT Education - 10/05/16 [id number]    Education provided Yes   Education Details Progression with ROM, continued manual therapy approach    Person(s) Educated Patient   Methods Explanation   Comprehension Verbalized understanding             PT Long Term Goals - 09/14/16 1512      PT LONG TERM GOAL #1   Title Pt will demonstrate improved cervical rotation AROM by 10 deg bilaterally and pain-free to demonstrate improved overall function.   Baseline R rotation: 60 deg; L rotation: 55 deg with increased pain on L   Time 6   Period Weeks     PT LONG TERM GOAL #2   Title Pt will be independent with HEP to promote return to function and decrease risk of reinjury.   Time 6   Period Weeks   Status New     PT LONG TERM GOAL #3   Title Pt will be able to correct and demonstrate proper sitting posture to decrease pain and improve overall function.   Time 6   Period Weeks   Status New               Plan - 10/05/16 4128    Clinical Impression Statement Patient continues to present with decreased ROM with L cervical rotation which appears to be contributing to her symptoms. She reports decline in symptoms with manual techniques geared towards increasing her ROM, though this continues to be limited ~25% of her R cervical  rotation.    Rehab Potential Fair   Clinical Impairments Affecting Rehab Potential co-morbidities, time since onset, relatively sedentary lifestyle and improper posture   PT Frequency 2x / week   PT Duration 6  weeks   PT Treatment/Interventions ADLs/Self Care Home Management;Electrical Stimulation;Moist Heat;Traction;Functional mobility training;Therapeutic activities;Therapeutic exercise;Neuromuscular re-education;Patient/family education;Manual techniques;Passive range of motion;Dry needling;Taping   PT Next Visit Plan suboccipital MET, manual UT/LS/scalene stretch, soft tissue mobilization, thoracic/cervical mobilizations   PT Home Exercise Plan UT and levator scap stretch, cervical retractions   Consulted and Agree with Plan of Care Patient      Patient will benefit from skilled therapeutic intervention in order to improve the following deficits and impairments:  Decreased mobility, Decreased range of motion, Hypomobility, Increased muscle spasms, Improper body mechanics, Postural dysfunction, Obesity, Pain  Visit Diagnosis: Cervicalgia     Problem List Patient Active Problem List   Diagnosis Date Noted  . Other dysphagia 09/17/2016  . Gastroesophageal reflux disease 09/17/2016  . Abdominal pain, epigastric 09/17/2016  . Neck pain 09/04/2016  . Tension headache 02/18/2016  . Degenerative arthritis of left knee 12/24/2015  . Left facial pain 10/29/2015  . Memory loss 10/29/2015  . Osteoarthritis Knees, bilateral, moderate 06/16/2015  . Smoker 05/10/2015  . Solitary pulmonary nodule 03/28/2013  . Cardiac murmur 03/15/2012  . Hypertension   . UNSPECIFIED VITAMIN D DEFICIENCY 05/29/2009  . Pure hypercholesterolemia 12/07/2007  . Major depressive disorder, recurrent episode, in partial remission ([place]) 12/07/2007  . Diabetes mellitus type 2, controlled ([place]) 08/10/2007   Kerman Passey, PT, DPT    10/05/2016, 9:46 AM  [facility] [address], [place], [address] Phone: [phone]   Fax:  [phone]  Name: Amanda [name] MRN: [id number] Date of Birth: 06/18/1951

## 2016-10-06 ENCOUNTER — Encounter: Payer: Self-pay | Admitting: Physical Therapy

## 2016-10-06 ENCOUNTER — Ambulatory Visit: Payer: Medicare Other | Admitting: Physical Therapy

## 2016-10-06 DIAGNOSIS — M542 Cervicalgia: Secondary | ICD-10-CM

## 2016-10-06 NOTE — Therapy (Signed)
[facility] [address] [place], [address] Phone: [phone]   Fax:  [phone]  Physical Therapy Treatment  Patient Details  Name: Amanda [name] MRN: [id number] Date of Birth: 09-19-1951 Referring Provider: Ellouise Newer, MD  Encounter Date: 10/06/2016      PT End of Session - 10/06/16 0855    Visit Number 6   Number of Visits 13   Date for PT Re-Evaluation 10/26/16   PT Start Time 0850   PT Stop Time 0930   PT Time Calculation (min) 40 min   Activity Tolerance Patient tolerated treatment well   Behavior During Therapy [facility] for tasks assessed/performed      Past Medical History:  Diagnosis Date  . Anxiety   . Constipation    uses docusate and fiber  . Degenerative arthritis of knee, bilateral 06/16/2015  . Depression   . Diabetes ([place])    diet managed-type 11  . GERD (gastroesophageal reflux disease)   . Headache   . Heart murmur    slight per pt- benign   . History of tachycardia   . Hyperlipidemia   . Hypertension    hx of tachycardia   . 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 Pneumonia    hx of this several times  . Shortness of breath dyspnea    with exertion  . Smoker 05/10/2015  . Tachycardia   . Thyroid disease    hyperthyroidism as a teenager - received some injections age 22 that corrected this   . Tobacco abuse     Past Surgical History:  Procedure Laterality Date  . ABDOMINAL HYSTERECTOMY  1993  . BACK SURGERY    . CHOLECYSTECTOMY    . COLONOSCOPY     30 years ago was normal per pt.   . ESOPHAGEAL DILATION     x 3  . fractured leg Right   . OVARIAN CYST REMOVAL  1972  . SALIVARY GLAND SURGERY    . TONSILLECTOMY     4 yoa  . TOTAL KNEE ARTHROPLASTY Left 12/24/2015   Procedure: LEFT TOTAL KNEE ARTHROPLASTY;  Surgeon: [name], MD;  Location: [facility];  Service: Orthopedics;  Laterality: Left;  . WRIST FRACTURE SURGERY   11/2008    There were no vitals filed for this visit.      Subjective Assessment - 10/06/16 0852    Subjective Pt reports that she is sore all over, which she attributes to the change in weather. She reports that her L shoulder is sore "like it's bruised" from the top of her shoulder down to her shoulder blade. She also reports that her face pain is back, like a dull headache, with intermittent sharp shooting pains.   Pertinent History Pt reports having neck, shoulder, shoulder blade pain for at least 1 year. She states the the whole left side of her head feels sore "like it's bruised." She states she gets "prickly feelings in her temples." She had a CT scan which was negative and states that her neurologist told her she thinks it's more of a nerve problem since the CT scan didn't show anything. She reports that her medication helps the burning, stinging, prickly feeling. She states if her neck is really bothering her, she will have n/t in her LUE but it does not go past her elbow. She reports having headaches every day and if it is a severe headache, she reports  she will get slightly blurred vision in her L eye. Most days it is better in the AM and gets worse as the day goes on. She denies B/B changing but does report increased nausea but she attributes that to her stomach. She reports rest and rubbing on the L side of her face make it feel better. This pain does not affect her sleep once she is asleep; sleeping on her L side is the most comfortable. She has not tried ice or heat. She can't provide any aggravating movements for her neck pain. She reports having a really bad fall 3 years ago when she tripped on a doorstop, fell onto her knees and ended up hitting her head on 2 doors; her neck and headaches began after that fall. She is right handed. She reports she joined silver sneakers and she is going to begin working out at the hospital.   Patient Stated Goals decrease pain    Currently in Pain? Yes    Pain Score 2    Pain Location Face   Pain Orientation Left   Pain Descriptors / Indicators Aching;Headache   Pain Onset More than a month ago      CPAs and UPAs to L C3-T8 1x20-30 sec bouts each segment; pt slightly TTP   Cross friction and efflurage to distal levator scap/middle trap/rhomboids x 5 mins; pt increased TTP  Rhomboid trigger point ischemic release (elevating shoulder and scapula in order to target rhomboid better  Isometric prone shoulder extensions x 2 sets for 3" holds for 6 repetitions   Isometric prone rows x 2 sets for 3" holds for 6 repetitions   Prone low trap MMT position ~135 degrees abduction raises x 10 repetitions with AAROM (LT weakness, noted to feel initially in UT region on L side, provided manual cuing for subsequent set) Able to feel in LT and MT region x 10 repetitions with manual cuing and 100-110 degrees of abudction and AAROM for 3rd set 12 repetitions (felt good and appropriate activation)  Patient reported increased ROM with no pain, mild soreness but that she can obviously tell a change in ROM.                             PT Education - 10/06/16 1012    Education provided Yes   Education Details Isometric pain control rationale, HEP of lower trap/mid trap.    Person(s) Educated Patient   Methods Explanation;Demonstration;Handout   Comprehension Verbalized understanding;Returned demonstration             PT Long Term Goals - 09/14/16 1512      PT LONG TERM GOAL #1   Title Pt will demonstrate improved cervical rotation AROM by 10 deg bilaterally and pain-free to demonstrate improved overall function.   Baseline R rotation: 60 deg; L rotation: 55 deg with increased pain on L   Time 6   Period Weeks     PT LONG TERM GOAL #2   Title Pt will be independent with HEP to promote return to function and decrease risk of reinjury.   Time 6   Period Weeks   Status New     PT LONG TERM GOAL #3   Title Pt will be  able to correct and demonstrate proper sitting posture to decrease pain and improve overall function.   Time 6   Period Weeks   Status New  Plan - 10/06/16 1014    Clinical Impression Statement Patient demonstrates improved AROM at conclusion of session with reports of decreased symptoms/stiffness. She demonstrates altered NM pattern (subbing UT/levator for MT/LT). Patient tolerated isometrics well with cuing for use of MT/LT. Patient reports she has made noticeable improvement in her symptoms since beginning therapy.    Rehab Potential Fair   Clinical Impairments Affecting Rehab Potential co-morbidities, time since onset, relatively sedentary lifestyle and improper posture   PT Frequency 2x / week   PT Duration 6 weeks   PT Treatment/Interventions ADLs/Self Care Home Management;Electrical Stimulation;Moist Heat;Traction;Functional mobility training;Therapeutic activities;Therapeutic exercise;Neuromuscular re-education;Patient/family education;Manual techniques;Passive range of motion;Dry needling;Taping   PT Next Visit Plan suboccipital MET, manual UT/LS/scalene stretch, soft tissue mobilization, thoracic/cervical mobilizations   PT Home Exercise Plan UT and levator scap stretch, cervical retractions   Consulted and Agree with Plan of Care Patient      Patient will benefit from skilled therapeutic intervention in order to improve the following deficits and impairments:  Decreased mobility, Decreased range of motion, Hypomobility, Increased muscle spasms, Improper body mechanics, Postural dysfunction, Obesity, Pain  Visit Diagnosis: Cervicalgia     Problem List Patient Active Problem List   Diagnosis Date Noted  . Other dysphagia 09/17/2016  . Gastroesophageal reflux disease 09/17/2016  . Abdominal pain, epigastric 09/17/2016  . Neck pain 09/04/2016  . Tension headache 02/18/2016  . Degenerative arthritis of left knee 12/24/2015  . Left facial pain 10/29/2015   . Memory loss 10/29/2015  . Osteoarthritis Knees, bilateral, moderate 06/16/2015  . Smoker 05/10/2015  . Solitary pulmonary nodule 03/28/2013  . Cardiac murmur 03/15/2012  . Hypertension   . UNSPECIFIED VITAMIN D DEFICIENCY 05/29/2009  . Pure hypercholesterolemia 12/07/2007  . Major depressive disorder, recurrent episode, in partial remission ([place]) 12/07/2007  . Diabetes mellitus type 2, controlled ([place]) 08/10/2007    Kerman Passey, PT, DPT    10/06/2016, 3:51 PM  [facility] [address], [place], [address] Phone: [phone]   Fax:  [phone]  Name: MARGARET COCKERILL MRN: [id number] Date of Birth: [birth year]

## 2016-10-06 NOTE — Patient Instructions (Addendum)
CPAs and UPAs to L C3-T8 1x20-30 sec bouts each segment; pt slightly TTP   Cross friction and efflurage to distal levator scap/middle trap/rhomboids x 5 mins; pt increased TTP  Rhomboid trigger point ischemic release (elevating shoulder and scapula in order to target rhomboid better  Isometric prone shoulder extensions x 2 sets for 3" holds for 6 repetitions   Isometric prone rows x 2 sets for 3" holds for 6 repetitions   Prone low trap MMT position ~135 degrees abduction raises x 10 repetitions with AAROM (LT weakness, noted to feel initially in UT region on L side, provided manual cuing for subsequent set) Able to feel in LT and MT region x 10 repetitions with manual cuing and 100-110 degrees of abudction and AAROM for 3rd set 12 repetitions (felt good and appropriate activation)  Patient reported increased ROM with no pain, mild soreness but that she can obviously tell a change in ROM.

## 2016-10-08 ENCOUNTER — Ambulatory Visit: Payer: Medicare Other | Attending: Neurology | Admitting: Physical Therapy

## 2016-10-08 ENCOUNTER — Encounter: Payer: Self-pay | Admitting: Physical Therapy

## 2016-10-08 DIAGNOSIS — M542 Cervicalgia: Secondary | ICD-10-CM | POA: Diagnosis not present

## 2016-10-08 NOTE — Patient Instructions (Signed)
[email or web address]  bil pec stretch in doorway Y's on wall with lift-off

## 2016-10-08 NOTE — Therapy (Signed)
[facility] [address], [place], [address] Phone: [phone]   Fax:  [phone]  Physical Therapy Treatment  Patient Details  Name: Amanda [name] MRN: [id number] Date of Birth: [birth year] Referring Provider: Ellouise Newer, MD  Encounter Date: 10/08/2016      PT End of Session - 10/08/16 1033    Visit Number 7   Number of Visits 13   Date for PT Re-Evaluation 10/26/16   PT Start Time 1034   PT Stop Time 1115   PT Time Calculation (min) 41 min   Activity Tolerance Patient tolerated treatment well   Behavior During Therapy [facility] for tasks assessed/performed      Past Medical History:  Diagnosis Date  . Anxiety   . Constipation    uses docusate and fiber  . Degenerative arthritis of knee, bilateral 06/16/2015  . Depression   . Diabetes ([place])    diet managed-type 11  . GERD (gastroesophageal reflux disease)   . Headache   . Heart murmur    slight per pt- benign   . History of tachycardia   . Hyperlipidemia   . Hypertension    hx of tachycardia   . 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 Pneumonia    hx of this several times  . Shortness of breath dyspnea    with exertion  . Smoker 05/10/2015  . Tachycardia   . Thyroid disease    hyperthyroidism as a teenager - received some injections age 61 that corrected this   . Tobacco abuse     Past Surgical History:  Procedure Laterality Date  . ABDOMINAL HYSTERECTOMY  1993  . BACK SURGERY    . CHOLECYSTECTOMY    . COLONOSCOPY     30 years ago was normal per pt.   . ESOPHAGEAL DILATION     x 3  . fractured leg Right   . OVARIAN CYST REMOVAL  1972  . SALIVARY GLAND SURGERY    . TONSILLECTOMY     4 yoa  . TOTAL KNEE ARTHROPLASTY Left 12/24/2015   Procedure: LEFT TOTAL KNEE ARTHROPLASTY;  Surgeon: [name], MD;  Location: [facility];  Service: Orthopedics;  Laterality: Left;  . WRIST FRACTURE SURGERY  11/2008     There were no vitals filed for this visit.      Subjective Assessment - 10/08/16 1035    Subjective Pt states that her lower back and hip are hurting her this morning but that her neck feels good. She reports that it is slightly stiff and most of her discomfort is coming from down in the shoulder blade. She said that started hurting after Tuesday's session but is much better today.   Pertinent History Pt reports having neck, shoulder, shoulder blade pain for at least 1 year. She states the the whole left side of her head feels sore "like it's bruised." She states she gets "prickly feelings in her temples." She had a CT scan which was negative and states that her neurologist told her she thinks it's more of a nerve problem since the CT scan didn't show anything. She reports that her medication helps the burning, stinging, prickly feeling. She states if her neck is really bothering her, she will have n/t in her LUE but it does not go past her elbow. She reports having headaches every day and if it is a severe headache, she reports she will get slightly  blurred vision in her L eye. Most days it is better in the AM and gets worse as the day goes on. She denies B/B changing but does report increased nausea but she attributes that to her stomach. She reports rest and rubbing on the L side of her face make it feel better. This pain does not affect her sleep once she is asleep; sleeping on her L side is the most comfortable. She has not tried ice or heat. She can't provide any aggravating movements for her neck pain. She reports having a really bad fall 3 years ago when she tripped on a doorstop, fell onto her knees and ended up hitting her head on 2 doors; her neck and headaches began after that fall. She is right handed. She reports she joined [employer] sne[employer] and she is going to begin working out at the hospital.   Patient Stated Goals decrease pain    Currently in Pain? Yes   Pain Score 2    Pain Location  Scapula   Pain Orientation Left   Pain Descriptors / Indicators Sore   Pain Onset More than a month ago      AROM R cervical rotation: 80 deg, slight tightness on the L L cervical rotation: 72 deg, tightness/pain/discomfort on L   Manual: Cross friction, efflurage, muscle stripping to L middle trap/rhomboid/levator scap (along the medial border of scapula) and L lower cervical and upper thoracic erector spinae (right along the border of the spinous processes) x 23 mins total; pt had increased TTP throughout all; pt educated that she will most likely be point tender throughout the musculature that was treated with soft tissue mobilization, but that it should feel better within a day or 2; pt verbalized understanding  Cervical AROM after manual therapy: R cervical rotation: 82 deg, minimal tightness on L, less than pre-manual therapy L cervical rotation: 90 deg, minimal tightness on L, less than pre-manual therapy  Assessed cervical flexion and extension in sitting; pt demo ROM WNL, but had pulling with both; pt noted to be sitting with forward head posture and forward rounded shoulders, cued pt to attain more upright posture and preform cervical flexion/extension gain; pt reported decreased pulling sensation throughout once in proper posture  Therex: Bil pec stretch in doorway, 3x30 sec; pt reported very strong stretch bil, but tolerable; added to HEP and educated pt that this couple with her scap retraction she's performing at home will facilitate more upright posture; pt reported decreased pain with cervical flexion/extension afterwards; mod cues for proper technique  Y's on wall with lift off to target LT, x3, x5; initially attempted with YTB but too difficult for pt; pt still challenged with this without added resistance; had difficulty performing this due to weakness but pt's LT firing appropriately throughout; mod tactile cues to decrease UT engagement throughout; mod cues for proper  technique; added to HEP      PT Education - 10/08/16 1117    Education provided Yes   Education Details continue HEP, add bil biceps stretch and Y's on wall with lift off to HEP   Person(s) Educated Patient   Methods Explanation   Comprehension Verbalized understanding             PT Long Term Goals - 09/14/16 1512      PT LONG TERM GOAL #1   Title Pt will demonstrate improved cervical rotation AROM by 10 deg bilaterally and pain-free to demonstrate improved overall function.   Baseline R rotation: 60  deg; L rotation: 55 deg with increased pain on L   Time 6   Period Weeks     PT LONG TERM GOAL #2   Title Pt will be independent with HEP to promote return to function and decrease risk of reinjury.   Time 6   Period Weeks   Status New     PT LONG TERM GOAL #3   Title Pt will be able to correct and demonstrate proper sitting posture to decrease pain and improve overall function.   Time 6   Period Weeks   Status New               Plan - 10/08/16 1159    Clinical Impression Statement Pt continues to make progress as demo by improving AROM with decreasing pain. Pt continues with increased soft tissue restrictions of L upper thoracic and lower cervical erector spinae, MT, LT, rhomboid, levator scap which were all addressed with manual therapy. Pt tolerated well and had improved AROM following. Pt poor posture likely contributing to neck pain; when SPT cued pt to attain proper sitting posture and perform cervical AROM, she had decreased pain. Pt given progressed HEP to further promote LT strength and proper posture. Pt needs continued skilled PT intervention to maximize overall function.   Rehab Potential Fair   Clinical Impairments Affecting Rehab Potential co-morbidities, time since onset, relatively sedentary lifestyle and improper posture   PT Frequency 2x / week   PT Duration 6 weeks   PT Treatment/Interventions ADLs/Self Care Home Management;Electrical  Stimulation;Moist Heat;Traction;Functional mobility training;Therapeutic activities;Therapeutic exercise;Neuromuscular re-education;Patient/family education;Manual techniques;Passive range of motion;Dry needling;Taping   PT Next Visit Plan suboccipital MET, manual UT/LS/scalene stretch, soft tissue mobilization, thoracic/cervical mobilizations   PT Home Exercise Plan UT and levator scap stretch, cervical retractions   Consulted and Agree with Plan of Care Patient      Patient will benefit from skilled therapeutic intervention in order to improve the following deficits and impairments:  Decreased mobility, Decreased range of motion, Hypomobility, Increased muscle spasms, Improper body mechanics, Postural dysfunction, Obesity, Pain  Visit Diagnosis: Cervicalgia     Problem List Patient Active Problem List   Diagnosis Date Noted  . Other dysphagia 09/17/2016  . Gastroesophageal reflux disease 09/17/2016  . Abdominal pain, epigastric 09/17/2016  . Neck pain 09/04/2016  . Tension headache 02/18/2016  . Degenerative arthritis of left knee 12/24/2015  . Left facial pain 10/29/2015  . Memory loss 10/29/2015  . Osteoarthritis Knees, bilateral, moderate 06/16/2015  . Smoker 05/10/2015  . Solitary pulmonary nodule 03/28/2013  . Cardiac murmur 03/15/2012  . Hypertension   . UNSPECIFIED VITAMIN D DEFICIENCY 05/29/2009  . Pure hypercholesterolemia 12/07/2007  . Major depressive disorder, recurrent episode, in partial remission ([place]) 12/07/2007  . Diabetes mellitus type 2, controlled ([place]) 08/10/2007   Geraldine Solar, SPT  Geraldine Solar 10/08/2016, 12:05 PM  [facility] [address] [place], [address] Phone: [phone]   Fax:  [phone]  Name: Amanda [name] MRN: [id number] Date of Birth: Apr 25, 1951

## 2016-10-19 ENCOUNTER — Encounter: Payer: Self-pay | Admitting: Physical Therapy

## 2016-10-19 ENCOUNTER — Ambulatory Visit: Payer: Medicare Other | Admitting: Physical Therapy

## 2016-10-19 DIAGNOSIS — M542 Cervicalgia: Secondary | ICD-10-CM | POA: Diagnosis not present

## 2016-10-19 NOTE — Therapy (Signed)
[facility] [address] [place], [address] Phone: [phone]   Fax:  [phone]  Physical Therapy Treatment  Patient Details  Name: Amanda [name] MRN: [id number] Date of Birth: 09-Jan-1951 Referring Provider: Ellouise Newer, MD  Encounter Date: 10/19/2016      PT End of Session - 10/19/16 0956    Visit Number 8   Number of Visits 13   Date for PT Re-Evaluation 10/26/16   Authorization - Visit Number --   PT Start Time 0626   PT Stop Time 1038   PT Time Calculation (min) 48 min   Activity Tolerance Patient tolerated treatment well   Behavior During Therapy [facility] for tasks assessed/performed      Past Medical History:  Diagnosis Date  . Anxiety   . Constipation    uses docusate and fiber  . Degenerative arthritis of knee, bilateral 06/16/2015  . Depression   . Diabetes ([place])    diet managed-type 11  . GERD (gastroesophageal reflux disease)   . Headache   . Heart murmur    slight per pt- benign   . History of tachycardia   . Hyperlipidemia   . Hypertension    hx of tachycardia   . 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 Pneumonia    hx of this several times  . Shortness of breath dyspnea    with exertion  . Smoker 05/10/2015  . Tachycardia   . Thyroid disease    hyperthyroidism as a teenager - received some injections age 12 that corrected this   . Tobacco abuse     Past Surgical History:  Procedure Laterality Date  . ABDOMINAL HYSTERECTOMY  1993  . BACK SURGERY    . CHOLECYSTECTOMY    . COLONOSCOPY     30 years ago was normal per pt.   . ESOPHAGEAL DILATION     x 3  . fractured leg Right   . OVARIAN CYST REMOVAL  1972  . SALIVARY GLAND SURGERY    . TONSILLECTOMY     4 yoa  . TOTAL KNEE ARTHROPLASTY Left 12/24/2015   Procedure: LEFT TOTAL KNEE ARTHROPLASTY;  Surgeon: [name], MD;  Location: [facility];  Service: Orthopedics;  Laterality: Left;   . WRIST FRACTURE SURGERY  11/2008    There were no vitals filed for this visit.      Subjective Assessment - 10/19/16 0952    Subjective Pt reports that she fell over the weekend. She states she was getting out of a bed of a truck and stepped on a recliner to help get out of the bed and when she did, she stepped on the arm of the chair which caused the chair to tip and her to fall into the chair. She hit her elbow on the corner of a cement step but otherwise, she was uninjured. She did not hit her head, but does report her back and neck being sore.   Pertinent History Pt reports having neck, shoulder, shoulder blade pain for at least 1 year. She states the the whole left side of her head feels sore "like it's bruised." She states she gets "prickly feelings in her temples." She had a CT scan which was negative and states that her neurologist told her she thinks it's more of a nerve problem since the CT scan didn't show anything. She reports that her medication helps the burning, stinging, prickly feeling.  She states if her neck is really bothering her, she will have n/t in her LUE but it does not go past her elbow. She reports having headaches every day and if it is a severe headache, she reports she will get slightly blurred vision in her L eye. Most days it is better in the AM and gets worse as the day goes on. She denies B/B changing but does report increased nausea but she attributes that to her stomach. She reports rest and rubbing on the L side of her face make it feel better. This pain does not affect her sleep once she is asleep; sleeping on her L side is the most comfortable. She has not tried ice or heat. She can't provide any aggravating movements for her neck pain. She reports having a really bad fall 3 years ago when she tripped on a doorstop, fell onto her knees and ended up hitting her head on 2 doors; her neck and headaches began after that fall. She is right handed. 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   Patient Stated Goals decrease pain    Currently in Pain? No/denies   Pain Onset More than a month ago     MANUAL: Cervical AROM at beginning of session: R rotation: 90 deg, slight stiffness around L shoulder blade (not painful, just stiff) L rotation: 80 deg, stiffness around L shoulder blade (more than when looking R; not pain, just stiff)  Cross friction, efflurage, muscle stripping to L middle trap/rhomboid/levator scap (along the medial border of scapula) and L lower cervical and upper thoracic erector spinae (right along the border of the spinous processes) x 23 mins total; pt had increased TTP throughout all; pt educated to expect point-tenderness following manual therapy again; pt verbalized understanding; pt reported decreased stiffness following but c/o continued stiffness at L shoulder blade regions  Rhomboid trigger point ischemic release with pt's L hand on lower back and SPT extending shoulder joint/retracting shoulder blade to put rhomboid on slack in order to target rhomboid better x 7 mins total; pt had increased TTP and palpable trigger point along medial border of L shoulder blade; provided extensive education to perform self-soft tissue mobilization with tennis ball in pillow case; pt verbalized understanding; pt had decreased tightness/stiffness following treatment   Cervical AROM following manual therapy: R rotation: 93 deg, no reports of stiffness or tightness L rotation: 90 deg, very mild tightness in L shoulder blade region but no stiffness   Self-Care Home Mgmt: HEP education: Band pulls with GTB x 5, added to HEP   Bil scap retraction with ER with GTB x 5, added to HEP  Self-soft tissue mobilization with tennis ball in pillow case to L periscapular region        PT Education - 10/19/16 1045    Education provided Yes   Education Details continue HEP, added band pulls and scap retraction  with ER to HEP; follow-up in 1 month to assess self-management    Person(s) Educated Patient   Methods Explanation   Comprehension Verbalized understanding             PT Long Term Goals - 10/19/16 1051      PT LONG TERM GOAL #1   Title Pt will demonstrate improved cervical rotation AROM by 10 deg bilaterally and pain-free to demonstrate improved overall function.   Baseline 11/13: R cervical rotation: 93 deg, no pain/tightness/stiffness; L cervical rotation: 90  deg, mild tightness in L shoulder blade region   Time 6   Period Weeks   Status Achieved     PT LONG TERM GOAL #2   Title Pt will be independent with HEP to promote return to function and decrease risk of reinjury.   Baseline 11/13: inconsistently performing HEP, but demonstrates and verbalizes understanding of exercises and need for performance of them   Time 6   Period Weeks   Status On-going     PT LONG TERM GOAL #3   Title Pt will be able to correct and demonstrate proper sitting posture to decrease pain and improve overall function.   Baseline 11/13: able to attain and maintain proper sitting posture with verbal cueing   Time 6   Period Weeks   Status Achieved               Plan - 10/19/16 1046    Clinical Impression Statement Pt has made significant improvements in reports of pain and AROM since beginning therapy. She does not c/o pain anymore, as it is more stiff and tight in L shoulder blade region with cervical AROM. She continues to respond well to manual therapy, demonstrating improved AROM and decreased stiffness/tightness following. Due to pt's progress, she will be released to progressed HEP for self-management and will have follow-up appointment in 1 month. Pt given updated periscapular strengthening exercises and educated to perform self-soft tissue mobilization to L shoulder blade region with tennis ball. Pt verbalized understanding of current POC.    Rehab Potential Fair   Clinical Impairments  Affecting Rehab Potential co-morbidities, time since onset, relatively sedentary lifestyle and improper posture   PT Frequency 2x / week   PT Duration 6 weeks   PT Treatment/Interventions ADLs/Self Care Home Management;Electrical Stimulation;Moist Heat;Traction;Functional mobility training;Therapeutic activities;Therapeutic exercise;Neuromuscular re-education;Patient/family education;Manual techniques;Passive range of motion;Dry needling;Taping   PT Next Visit Plan suboccipital MET, manual UT/LS/scalene stretch, soft tissue mobilization, thoracic/cervical mobilizations   PT Home Exercise Plan UT and levator scap stretch, cervical retractions   Consulted and Agree with Plan of Care Patient      Patient will benefit from skilled therapeutic intervention in order to improve the following deficits and impairments:  Decreased mobility, Decreased range of motion, Hypomobility, Increased muscle spasms, Improper body mechanics, Postural dysfunction, Obesity, Pain  Visit Diagnosis: Cervicalgia     Problem List Patient Active Problem List   Diagnosis Date Noted  . Other dysphagia 09/17/2016  . Gastroesophageal reflux disease 09/17/2016  . Abdominal pain, epigastric 09/17/2016  . Neck pain 09/04/2016  . Tension headache 02/18/2016  . Degenerative arthritis of left knee 12/24/2015  . Left facial pain 10/29/2015  . Memory loss 10/29/2015  . Osteoarthritis Knees, bilateral, moderate 06/16/2015  . Smoker 05/10/2015  . Solitary pulmonary nodule 03/28/2013  . Cardiac murmur 03/15/2012  . Hypertension   . UNSPECIFIED VITAMIN D DEFICIENCY 05/29/2009  . Pure hypercholesterolemia 12/07/2007  . Major depressive disorder, recurrent episode, in partial remission ([place]) 12/07/2007  . Diabetes mellitus type 2, controlled ([place]) 08/10/2007   Geraldine Solar, SPT  Geraldine Solar 10/19/2016, 10:53 AM  [facility] [address], [place], [address] Phone: [phone]   Fax:  [phone]  Name: Amanda [name] MRN: [id number] Date of Birth: Mar 06, 1951

## 2016-10-19 NOTE — Patient Instructions (Signed)
[email or web address]  bil scap retraction with ER with RTB or GTB Band pulls with RTB or GTB

## 2016-10-22 ENCOUNTER — Encounter: Payer: Medicare Other | Admitting: Physical Therapy

## 2016-10-28 ENCOUNTER — Encounter: Payer: Self-pay | Admitting: Family Medicine

## 2016-10-28 ENCOUNTER — Ambulatory Visit (INDEPENDENT_AMBULATORY_CARE_PROVIDER_SITE_OTHER): Payer: Medicare Other | Admitting: Family Medicine

## 2016-10-28 VITALS — BP 110/60 | HR 82 | Temp 97.9°F | Wt 220.0 lb

## 2016-10-28 DIAGNOSIS — J449 Chronic obstructive pulmonary disease, unspecified: Secondary | ICD-10-CM | POA: Diagnosis not present

## 2016-10-28 DIAGNOSIS — F3341 Major depressive disorder, recurrent, in partial remission: Secondary | ICD-10-CM

## 2016-10-28 MED ORDER — ALBUTEROL SULFATE HFA 108 (90 BASE) MCG/ACT IN AERS: 2.0000 | INHALATION_SPRAY | RESPIRATORY_TRACT | 5 refills | Status: DC | PRN

## 2016-10-28 NOTE — Progress Notes (Signed)
Dr. Frederico Hamman T. Wilfrid Hyser, MD, [facility] [place], [address] Phone: [phone] Fax: [phone]  10/28/2016  Patient: Amanda [name], MRN: [id number], DOB: [birth year], 65 y.o.  Primary Physician:  Owens Loffler, MD   Chief Complaint  Patient presents with  . Follow-up    COPD   Subjective:   [name] is a 65 y.o. very pleasant female patient who presents with the following:  F/u COPD and depression. COPD is much better after her course of prednisone.  She does not take any maintenance medicines as of now.  She is smoking again.  Depression and less foggy headed. Energy is a little better. shieldlike she is feeling better, she has more energy, and she is doing better in general.  She still has quite a bit of psychosocial stress going on at home particularly with her husband and her son.  Took pred and felt she has felt in a really long time, head and neck issues along with face and head.  Possibly wellbutrin.   Drug plan is with [facility]  09/16/2016 Last OV with Owens Loffler, MD  Patient presents with a multitude of complaints.  Wants to be sure that she does not have pneumonia.  She has not been breathing well over the last week or so.  She has been coughing more than normal and having shortness of breath  Nausea, having reflux.  Has needed EGD and she feels like she is having both dysphagia and odynophagia.  She has had previous strictures in her esophagus which have been dilated.  Still something on the L side - has been seeing neurology. This is well documented in both my notes and neurological notes. Neck PT next week - from Neurology.   Feels depressed and having some tightness from front to back.  Feels like something wrapped around her lung and having SOB with humidity.  Started smoking again.   We go over some extreme psychosocial stress which is ongoing relating to her husband and other  family members including her son, who had been her primary support structure.  Taking Prozac.   Wants to get checked for Lyme disease.  Multiple tick bites  Past Medical History, Surgical History, Social History, Family History, Problem List, Medications, and Allergies have been reviewed and updated if relevant.  Patient Active Problem List   Diagnosis Date Noted  . Other dysphagia 09/17/2016  . Gastroesophageal reflux disease 09/17/2016  . Abdominal pain, epigastric 09/17/2016  . Neck pain 09/04/2016  . Tension headache 02/18/2016  . Degenerative arthritis of left knee 12/24/2015  . Left facial pain 10/29/2015  . Memory loss 10/29/2015  . Osteoarthritis Knees, bilateral, moderate 06/16/2015  . Smoker 05/10/2015  . Solitary pulmonary nodule 03/28/2013  . Cardiac murmur 03/15/2012  . Hypertension   . UNSPECIFIED VITAMIN D DEFICIENCY 05/29/2009  . Pure hypercholesterolemia 12/07/2007  . Major depressive disorder, recurrent episode, in partial remission ([place]) 12/07/2007  . Diabetes mellitus type 2, controlled ([place]) 08/10/2007    Past Medical History:  Diagnosis Date  . Anxiety   . Constipation    uses docusate and fiber  . Degenerative arthritis of knee, bilateral 06/16/2015  . Depression   . Diabetes ([place])    diet managed-type 11  . GERD (gastroesophageal reflux disease)   . Headache   . Heart murmur    slight per pt- benign   . History of tachycardia   . Hyperlipidemia   .  Hypertension    hx of tachycardia   . Neuromuscular disorder ([place])    nerve damage in back and shoulder  . Pneumonia    hx of this several times  . Shortness of breath dyspnea    with exertion  . Smoker 05/10/2015  . Tachycardia   . Thyroid disease    hyperthyroidism as a teenager - received some injections age 7 that corrected this   . Tobacco abuse     Past Surgical History:  Procedure Laterality Date  . ABDOMINAL HYSTERECTOMY  1993  . BACK SURGERY    . CHOLECYSTECTOMY    .  COLONOSCOPY     30 years ago was normal per pt.   . ESOPHAGEAL DILATION     x 3  . fractured leg Right   . OVARIAN CYST REMOVAL  1972  . SALIVARY GLAND SURGERY    . TONSILLECTOMY     4 yoa  . TOTAL KNEE ARTHROPLASTY Left 12/24/2015   Procedure: LEFT TOTAL KNEE ARTHROPLASTY;  Surgeon: [name], MD;  Location: [facility];  Service: Orthopedics;  Laterality: Left;  . WRIST FRACTURE SURGERY  11/2008    Social History   Social History  . Marital status: Married    Spouse name: N/A  . Number of children: 2  . Years of education: N/A   Occupational History  . [employer]   Social History Main Topics  . Smoking status: Current Every Day Smoker    Packs/day: 0.50    Years: 45.00    Types: Cigarettes    Last attempt to quit: 12/24/2015  . Smokeless tobacco: Never Used  . Alcohol use 0.0 oz/week     Comment: glass of wine twice a month   . Drug use: No  . Sexual activity: Not on file   Other Topics Concern  . Not on file   Social History Narrative  . No narrative on file    Family History  Problem Relation Age of Onset  . Hyperlipidemia Mother   . Arthritis Mother   . Diabetes Mother   . Heart disease Mother   . Colon polyps Mother   . Cancer Paternal Grandmother     breast  . Hypercholesterolemia Sister   . Colon polyps Sister   . Colon cancer Neg Hx   . Esophageal cancer Neg Hx   . Rectal cancer Neg Hx   . Stomach cancer Neg Hx     Allergies  Allergen Reactions  . Diazepam Other (See Comments)    REACTION: pt states she gets very angry and abusive verbally on this medication     Medication list reviewed and updated in full in [facility].   GEN: No acute illnesses, no fevers, chills. GI: No n/v/d, eating normally Pulm: No SOB Interactive and getting along well at home.  Otherwise, ROS is as per the HPI.  Objective:   BP 110/60 (BP Location: Left Arm, Patient Position: Sitting, Cuff Size:  Normal)   Pulse 82   Temp 97.9 F (36.6 C) (Oral)   Wt 220 lb (99.8 kg)   SpO2 95%   BMI 33.45 kg/m   GEN: WDWN, NAD, Non-toxic, A & O x 3 HEENT: Atraumatic, Normocephalic. Neck supple. No masses, No LAD. Ears and Nose: No external deformity. CV: RRR, No M/G/R. No JVD. No thrill. No extra heart sounds. PULM: Normal respiratory rate, no accessory muscle use. No wheezes, crackles or rhonchi  EXTR: No c/c/e NEURO Normal  gait.  PSYCH: Normally interactive. Conversant. Not depressed or anxious appearing.  Calm demeanor.   Laboratory and Imaging Data: Results for orders placed or performed in visit on [id number]  Basic metabolic panel  Result Value Ref Range   Sodium 138 135 - 145 mEq/L   Potassium 4.6 3.5 - 5.1 mEq/L   Chloride 104 96 - 112 mEq/L   CO2 27 19 - 32 mEq/L   Glucose, Bld 117 (H) 70 - 99 mg/dL   BUN 11 6 - 23 mg/dL   Creatinine, Ser 0.77 0.40 - 1.20 mg/dL   Calcium 9.4 8.4 - 10.5 mg/dL   GFR 79.63 >60.00 mL/min  CBC with Differential/Platelet  Result Value Ref Range   WBC 7.5 4.0 - 10.5 K/uL   RBC 3.90 3.87 - 5.11 Mil/uL   Hemoglobin 13.1 12.0 - 15.0 g/dL   HCT 39.5 36.0 - 46.0 %   MCV 101.3 (H) 78.0 - 100.0 fl   MCHC 33.2 30.0 - 36.0 g/dL   RDW 12.1 11.5 - 15.5 %   Platelets 204.0 150.0 - 400.0 K/uL   Neutrophils Relative % 70.8 43.0 - 77.0 %   Lymphocytes Relative 21.5 12.0 - 46.0 %   Monocytes Relative 5.2 3.0 - 12.0 %   Eosinophils Relative 2.1 0.0 - 5.0 %   Basophils Relative 0.4 0.0 - 3.0 %   Neutro Abs 5.3 1.4 - 7.7 K/uL   Lymphs Abs 1.6 0.7 - 4.0 K/uL   Monocytes Absolute 0.4 0.1 - 1.0 K/uL   Eosinophils Absolute 0.2 0.0 - 0.7 K/uL   Basophils Absolute 0.0 0.0 - 0.1 K/uL  Hepatic function panel  Result Value Ref Range   Total Bilirubin 0.5 0.2 - 1.2 mg/dL   Bilirubin, Direct 0.1 0.0 - 0.3 mg/dL   Alkaline Phosphatase 47 39 - 117 U/L   AST 19 0 - 37 U/L   ALT 17 0 - 35 U/L   Total Protein 6.8 6.0 - 8.3 g/dL   Albumin 4.0 3.5 - 5.2 g/dL  TSH    Result Value Ref Range   TSH 0.92 0.35 - 4.50 uIU/mL  Lyme Ab/Western Blot Reflex  Result Value Ref Range   B burgdorferi Ab IgG+IgM <0.90 <0.90 Index  Hemoglobin A1c  Result Value Ref Range   Hgb A1c MFr Bld 6.1 4.6 - 6.5 %     Assessment and Plan:   Major depressive disorder, recurrent episode, in partial remission (HCC)  Chronic obstructive pulmonary disease, unspecified COPD type (HCC)  Feeling quite a bit better after starting Wellbutrin.  COPD is seemingly stable right now, much less short of breath.  I did give her some albuterol to use as needed.  Follow-up: No Follow-up on file.  Meds ordered this encounter  Medications  . albuterol (PROVENTIL HFA;VENTOLIN HFA) 108 (90 Base) MCG/ACT inhaler    Sig: Inhale 2 puffs into the lungs every 4 (four) hours as needed for wheezing or shortness of breath.    Dispense:  1 Inhaler    Refill:  5   Medications Discontinued During This Encounter  Medication Reason  . predniSONE (DELTASONE) 20 MG tablet Completed Course   No orders of the defined types were placed in this encounter.   Signed,  Maud Deed. Tylek Boney, MD     Medication List       Accurate as of 10/28/16 11:59 PM. Always use your most recent med list.          albuterol 108 (90 Base) MCG/ACT inhaler Commonly  known as:  PROVENTIL HFA;VENTOLIN HFA Inhale 2 puffs into the lungs every 4 (four) hours as needed for wheezing or shortness of breath.   amitriptyline 50 MG tablet Commonly known as:  ELAVIL Take 2 tablets at night   aspirin 81 MG tablet Take 81 mg by mouth daily.   atorvastatin 40 MG tablet Commonly known as:  LIPITOR TAKE ONE TABLET BY MOUTH ONCE DAILY   BENADRYL 25 mg capsule Generic drug:  diphenhydrAMINE Take 25 mg by mouth 2 (two) times daily.   buPROPion 150 MG 24 hr tablet Commonly known as:  WELLBUTRIN XL Take 1 tablet (150 mg total) by mouth daily.   clonazePAM 0.5 MG tablet Commonly known as:  KLONOPIN Take 1 tablet (0.5  mg total) by mouth 2 (two) times daily as needed.   cyclobenzaprine 10 MG tablet Commonly known as:  FLEXERIL TAKE ONE TABLET BY MOUTH THREE TIMES DAILY AS NEEDED FOR MUSCLE SPASM   esomeprazole 40 MG capsule Commonly known as:  NEXIUM Take 1 capsule (40 mg total) by mouth 2 (two) times daily before a meal.   FLUoxetine 40 MG capsule Commonly known as:  PROZAC TAKE TWO CAPSULES BY MOUTH ONCE DAILY   magnesium oxide 400 MG tablet Commonly known as:  MAG-OX Take 400 mg by mouth daily.   METAMUCIL PO Take 1 capsule by mouth daily.   metoprolol tartrate 25 MG tablet Commonly known as:  LOPRESSOR Take 1 tablet (25 mg total) by mouth 2 (two) times daily.   multivitamin tablet Take 1 tablet by mouth daily.   naproxen 125 MG/5ML suspension Commonly known as:  NAPROSYN Take by mouth 2 (two) times daily with a meal.   vitamin C 1000 MG tablet Take 1,000 mg by mouth daily.   Vitamin D 1000 units capsule Take 1,000 Units by mouth 2 (two) times daily.

## 2016-10-28 NOTE — Progress Notes (Signed)
Pre visit review using our clinic review tool, if applicable. No additional management support is needed unless otherwise documented below in the visit note. 

## 2016-10-30 ENCOUNTER — Encounter: Payer: Self-pay | Admitting: Family Medicine

## 2016-10-30 DIAGNOSIS — J449 Chronic obstructive pulmonary disease, unspecified: Secondary | ICD-10-CM

## 2016-10-30 HISTORY — DX: Chronic obstructive pulmonary disease, unspecified: J44.9

## 2016-11-02 ENCOUNTER — Ambulatory Visit (AMBULATORY_SURGERY_CENTER): Payer: Medicare Other | Admitting: Gastroenterology

## 2016-11-02 ENCOUNTER — Encounter: Payer: Self-pay | Admitting: Gastroenterology

## 2016-11-02 VITALS — BP 125/53 | HR 69 | Temp 97.1°F | Resp 11 | Ht 68.0 in | Wt 217.0 lb

## 2016-11-02 DIAGNOSIS — K219 Gastro-esophageal reflux disease without esophagitis: Secondary | ICD-10-CM | POA: Diagnosis not present

## 2016-11-02 DIAGNOSIS — R1319 Other dysphagia: Secondary | ICD-10-CM | POA: Diagnosis not present

## 2016-11-02 DIAGNOSIS — J449 Chronic obstructive pulmonary disease, unspecified: Secondary | ICD-10-CM | POA: Diagnosis not present

## 2016-11-02 DIAGNOSIS — I10 Essential (primary) hypertension: Secondary | ICD-10-CM | POA: Diagnosis not present

## 2016-11-02 DIAGNOSIS — K222 Esophageal obstruction: Secondary | ICD-10-CM | POA: Diagnosis not present

## 2016-11-02 MED ORDER — SODIUM CHLORIDE 0.9 % IV SOLN: 500.0000 mL | INTRAVENOUS | Status: DC

## 2016-11-02 MED ORDER — RANITIDINE HCL 150 MG PO TABS: 150.0000 mg | ORAL_TABLET | Freq: Every day | ORAL | 11 refills | Status: DC

## 2016-11-02 NOTE — Op Note (Signed)
[facility] Patient Name: Amanda [name] Procedure Date: 11/02/2016 1:15 PM MRN: [id number] Endoscopist: Milus Banister , MD Age: 65 Referring MD:  Date of Birth: [birth year] Gender: Female Account #: [id number] Procedure:                Upper GI endoscopy Indications:              Dysphagia, Heartburn Medicines:                Monitored Anesthesia Care Procedure:                Pre-Anesthesia Assessment:                           - Prior to the procedure, a History and Physical                            was performed, and patient medications and                            allergies were reviewed. The patient's tolerance of                            previous anesthesia was also reviewed. The risks                            and benefits of the procedure and the sedation                            options and risks were discussed with the patient.                            All questions were answered, and informed consent                            was obtained. Prior Anticoagulants: The patient has                            taken no previous anticoagulant or antiplatelet                            agents. ASA Grade Assessment: II - A patient with                            mild systemic disease. After reviewing the risks                            and benefits, the patient was deemed in                            satisfactory condition to undergo the procedure.                           After obtaining informed consent, the endoscope was  passed under direct vision. Throughout the                            procedure, the patient's blood pressure, pulse, and                            oxygen saturations were monitored continuously. The                            Model GIF-HQ190 [id number]) scope was introduced                            through the mouth, and advanced to the second part                            of duodenum. The upper GI  endoscopy was                            accomplished without difficulty. The patient                            tolerated the procedure well. Scope In: Scope Out: Findings:                 One moderate benign-appearing, intrinsic stenosis                            was found at the gastroesophageal junction (thick                            Schatzki's ring vs. focal peptic stricture. Lumen                            through stricture was approximately 82mm. A TTS                            dilator was passed through the scope. Dilation with                            a 15-16.5-18 mm balloon dilator was performed to 18                            mm. There was typical superficial mucosal tear and                            self limited oozing of blood following dilation.                           A 3 cm hiatal hernia was present.                           The exam was otherwise without abnormality. Complications:            No immediate complications. Estimated blood loss:  None. Estimated Blood Loss:     Estimated blood loss: none. Impression:               - Benign-appearing esophageal stenosis. Dilated.                           - 3 cm hiatal hernia.                           - The examination was otherwise normal. Recommendation:           - Patient has a contact number available for                            emergencies. The signs and symptoms of potential                            delayed complications were discussed with the                            patient. Return to normal activities tomorrow.                            Written discharge instructions were provided to the                            patient.                           - Resume previous diet.                           - Continue present medications. Please begin taking                            ranitidine 150mg  (OTC) pill at bedtime every night.                            Continue  nexium once daily (20-30 min prior to                            breakfast meal).                           - Repeat upper endoscopy PRN for retreatment. Milus Banister, MD 11/02/2016 1:32:13 PM This report has been signed electronically.

## 2016-11-02 NOTE — Progress Notes (Signed)
Report given to PACU RN, vss 

## 2016-11-02 NOTE — Patient Instructions (Signed)
YOU HAD AN ENDOSCOPIC PROCEDURE TODAY AT [facility]:   Refer to the procedure report that was given to you for any specific questions about what was found during the examination.  If the procedure report does not answer your questions, please call your gastroenterologist to clarify.  If you requested that your care partner not be given the details of your procedure findings, then the procedure report has been included in a sealed envelope for you to review at your convenience later.  YOU SHOULD EXPECT: Some feelings of bloating in the abdomen. Passage of more gas than usual.  Walking can help get rid of the air that was put into your GI tract during the procedure and reduce the bloating.   Please Note:  You might notice some irritation and congestion in your nose or some drainage.  This is from the oxygen used during your procedure.  There is no need for concern and it should clear up in a day or so.  SYMPTOMS TO REPORT IMMEDIATELY: Following upper endoscopy (EGD)  Vomiting of blood or coffee ground material  New chest pain or pain under the shoulder blades  Painful or persistently difficult swallowing  New shortness of breath  Fever of 100F or higher  Black, tarry-looking stools  For urgent or emergent issues, a gastroenterologist can be reached at any hour by calling [phone].   DIET:  FOLLOW DILATION DIET TODAY!! Drink plenty of fluids but you should avoid alcoholic beverages for 24 hours.  ACTIVITY:  You should plan to take it easy for the rest of today and you should NOT DRIVE or use heavy machinery until tomorrow (because of the sedation medicines used during the test).    FOLLOW UP: Our staff will call the number listed on your records the next business day following your procedure to check on you and address any questions or concerns that you may have regarding the information given to you following your procedure. If we do not reach you, we will leave a  message.  However, if you are feeling well and you are not experiencing any problems, there is no need to return our call.  We will assume that you have returned to your regular daily activities without incident.  SIGNATURES/CONFIDENTIALITY: You and/or your care partner have signed paperwork which will be entered into your electronic medical record.  These signatures attest to the fact that that the information above on your After Visit Summary has been reviewed and is understood.  Full responsibility of the confidentiality of this discharge information lies with you and/or your care-partner.  Please follow dilation diet today  Please read over handouts about stricture and hiatal hernia  Your Ranitidine prescription was sent to your pharmacy- take Nexium 1 tablet 20-30 minutes before breakfast and Ranitidine at bedtime

## 2016-11-02 NOTE — Progress Notes (Signed)
Called to room to assist during endoscopic procedure.  Patient ID and intended procedure confirmed with present staff. Received instructions for my participation in the procedure from the performing physician.  

## 2016-11-03 ENCOUNTER — Telehealth: Payer: Self-pay

## 2016-11-03 ENCOUNTER — Other Ambulatory Visit: Payer: Self-pay | Admitting: Family Medicine

## 2016-11-03 NOTE — Telephone Encounter (Signed)
  Follow up Call-  Call back number 11/02/2016 11/20/2015  Post procedure Call Back phone  # [phone]  Permission to leave phone message Yes Yes  Some recent data might be hidden     Patient questions:  Do you have a fever, pain , or abdominal swelling? No. Pain Score  0 *  Have you tolerated food without any problems? Yes.    Have you been able to return to your normal activities? Yes.    Do you have any questions about your discharge instructions: Diet   No. Medications  No. Follow up visit  No.  Do you have questions or concerns about your Care? No.  Actions: * If pain score is 4 or above: No action needed, pain <4.

## 2016-11-05 ENCOUNTER — Encounter: Payer: Self-pay | Admitting: Family Medicine

## 2016-11-05 ENCOUNTER — Ambulatory Visit (INDEPENDENT_AMBULATORY_CARE_PROVIDER_SITE_OTHER): Payer: Medicare Other | Admitting: Family Medicine

## 2016-11-05 VITALS — BP 128/72 | HR 80 | Temp 98.0°F | Wt 219.2 lb

## 2016-11-05 DIAGNOSIS — S46911A Strain of unspecified muscle, fascia and tendon at shoulder and upper arm level, right arm, initial encounter: Secondary | ICD-10-CM | POA: Diagnosis not present

## 2016-11-05 NOTE — Progress Notes (Signed)
Pre visit review using our clinic review tool, if applicable. No additional management support is needed unless otherwise documented below in the visit note. 

## 2016-11-05 NOTE — Patient Instructions (Signed)
Take your tramadol, Alleve and use heat Please take your flexeril at bedtime Do gentle range of motion exercises from your neck, shoulders, elbows Avoid getting stiff If not better in a week, come back to see Dr. Lorelei Pont

## 2016-11-05 NOTE — Progress Notes (Signed)
Subjective:    Patient ID: Amanda [name], female    DOB: 1951-05-31, 65 y.o.   MRN: [id number]  HPI This is a 65 yo female who presents today with right shoulder pain x 2 days. She was helping her daughter with some cleaning when she reached under the stove and felt a sharp burning pain that goes from the front of her shoulder up her neck and into the back of her shoulder. She has some tramadol at home leftover from surgery, but wasn't sure if she should take. Also has cyclobenzaprine at home for occasional back pain. She usually takes Alleve x 2 daily, but has not taken today. Pain with any movement. No numbness or tingling in arm. Has an area of numbness at base of neck that has been there for several years. Has been avoiding moving her arm and neck today.   Has had problems in this shoulder related to a fall several years ago.   Past Medical History:  Diagnosis Date  . Allergy    diazepam  . Allergy    bee stings  . Anxiety   . Cataract   . Constipation    uses docusate and fiber  . COPD (chronic obstructive pulmonary disease) ([place]) 10/30/2016  . Degenerative arthritis of knee, bilateral 06/16/2015  . Depression   . Diabetes ([place])    diet managed-type 11  . GERD (gastroesophageal reflux disease)   . Headache   . Heart murmur    slight per pt- benign   . Hiatal hernia with gastroesophageal reflux 1997  . History of tachycardia   . Hyperlipidemia   . Hypertension    hx of tachycardia   . Neuromuscular disorder ([place])    nerve damage in back and shoulder  . Osteoporosis   . Pneumonia    hx of this several times  . Shortness of breath dyspnea    with exertion  . Smoker 05/10/2015  . Tachycardia   . Thyroid disease    hyperthyroidism as a teenager - received some injections age 89 that corrected this   . Tobacco abuse    Past Surgical History:  Procedure Laterality Date  . ABDOMINAL HYSTERECTOMY  1993  . BACK SURGERY    . BREAST BIOPSY Right 2007  . CHOLECYSTECTOMY     . COLONOSCOPY     30 years ago was normal per pt.   . ESOPHAGEAL DILATION     x 3  . fractured leg Right   . OVARIAN CYST REMOVAL  1972  . SALIVARY GLAND SURGERY    . TONSILLECTOMY     4 yoa  . TOTAL KNEE ARTHROPLASTY Left 12/24/2015   Procedure: LEFT TOTAL KNEE ARTHROPLASTY;  Surgeon: [name], MD;  Location: [facility];  Service: Orthopedics;  Laterality: Left;  . WRIST FRACTURE SURGERY  11/2008   Family History  Problem Relation Age of Onset  . Hyperlipidemia Mother   . Arthritis Mother   . Diabetes Mother   . Heart disease Mother   . Colon polyps Mother   . Cancer Paternal Grandmother     breast  . Hypercholesterolemia Sister   . Colon polyps Sister   . Breast cancer Sister   . Colon cancer Neg Hx   . Esophageal cancer Neg Hx   . Rectal cancer Neg Hx   . Stomach cancer Neg Hx   . Pancreatic cancer Neg Hx    Social History  Substance Use Topics  . Smoking status: Current Every Day  Smoker    Packs/day: 0.50    Years: 45.00    Types: Cigarettes    Last attempt to quit: 12/24/2015  . Smokeless tobacco: Never Used  . Alcohol use 0.0 oz/week     Comment: glass of wine twice a month       Review of Systems Per HPI    Objective:   Physical Exam  Constitutional: She appears well-developed and well-nourished.  HENT:  Head: Normocephalic.  Cardiovascular: Normal rate.   Pulmonary/Chest: Effort normal.  Musculoskeletal:       Right shoulder: She exhibits decreased range of motion and tenderness.  Decreased abduction. Generalized tenderness across anterior chest, trapezius, anterior shoulder.   Vitals reviewed.     BP 128/72   Pulse 80   Temp 98 F (36.7 C) (Oral)   Wt 219 lb 4 oz (99.5 kg)   SpO2 93%   BMI 33.34 kg/m  Wt Readings from Last 3 Encounters:  11/05/16 219 lb 4 oz (99.5 kg)  11/02/16 217 lb (98.4 kg)  10/28/16 220 lb (99.8 kg)       Assessment & Plan:  1. Muscle strain of right shoulder region, initial encounter - encouraged her  to take the meds she has at home- cyclobenzaprine, tramadol, Alleve, apply heat and do gentle ROM several times a day - follow up with Dr. Lorelei Pont if no improvement in 5-7 days, sooner if worsening symptoms   Clarene Reamer, FNP-BC  [facility], [place]  11/05/2016 2:05 PM

## 2016-11-09 ENCOUNTER — Other Ambulatory Visit: Payer: Self-pay | Admitting: Family Medicine

## 2016-11-09 NOTE — Telephone Encounter (Signed)
Ok to refill #30, 3 refills

## 2016-11-09 NOTE — Telephone Encounter (Signed)
Last office visit 11/05/2016 with D. Carlean Purl.  Last refilled 06/08/16 for #30 with 3 refills. Ok to refill?

## 2016-11-09 NOTE — Telephone Encounter (Signed)
Clonazepam called into [facility], [place] Phone: [phone]

## 2016-11-16 ENCOUNTER — Ambulatory Visit: Payer: Medicare Other | Attending: Neurology | Admitting: Physical Therapy

## 2016-11-16 DIAGNOSIS — M542 Cervicalgia: Secondary | ICD-10-CM | POA: Diagnosis not present

## 2016-11-16 NOTE — Therapy (Signed)
[facility] [address] [place], [address] Phone: [phone]   Fax:  [phone]  Physical Therapy Treatment  Patient Details  Name: Amanda [name] MRN: [id number] Date of Birth: [birth year] Referring Provider: Ellouise Newer, MD  Encounter Date: 11/16/2016      PT End of Session - 11/16/16 1515    Visit Number 9   Number of Visits 13   Date for PT Re-Evaluation 11/16/16   PT Start Time 6269   PT Stop Time 1515   PT Time Calculation (min) 30 min   Activity Tolerance Patient tolerated treatment well;Patient limited by pain   Behavior During Therapy [facility] for tasks assessed/performed      Past Medical History:  Diagnosis Date  . Allergy    diazepam  . Allergy    bee stings  . Anxiety   . Cataract   . Constipation    uses docusate and fiber  . COPD (chronic obstructive pulmonary disease) ([place]) 10/30/2016  . Degenerative arthritis of knee, bilateral 06/16/2015  . Depression   . Diabetes ([place])    diet managed-type 11  . GERD (gastroesophageal reflux disease)   . Headache   . Heart murmur    slight per pt- benign   . Hiatal hernia with gastroesophageal reflux 1997  . History of tachycardia   . Hyperlipidemia   . Hypertension    hx of tachycardia   . 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 Osteoporosis   . Pneumonia    hx of this several times  . Shortness of breath dyspnea    with exertion  . Smoker 05/10/2015  . Tachycardia   . Thyroid disease    hyperthyroidism as a teenager - received some injections age 65 that corrected this   . Tobacco abuse     Past Surgical History:  Procedure Laterality Date  . ABDOMINAL HYSTERECTOMY  1993  . BACK SURGERY    . BREAST BIOPSY Right 2007  . CHOLECYSTECTOMY    . COLONOSCOPY     30 years ago was normal per pt.   . ESOPHAGEAL DILATION     x 3  . fractured leg Right   . OVARIAN CYST REMOVAL  1972  . SALIVARY GLAND  SURGERY    . TONSILLECTOMY     4 yoa  . TOTAL KNEE ARTHROPLASTY Left 12/24/2015   Procedure: LEFT TOTAL KNEE ARTHROPLASTY;  Surgeon: [name], MD;  Location: [facility];  Service: Orthopedics;  Laterality: Left;  . WRIST FRACTURE SURGERY  11/2008    There were no vitals filed for this visit.      Subjective Assessment - 11/16/16 1457    Subjective Patient reports she was cleaning the cooktop and reaching back to clean the far side of the top she felt a pain in her anterior shoulder, wrapping around to her shoulder blade. She saw a PA about this, was instructed to call back if she had pain still, which she has not called about. Reports the L side of her neck has been feeling fine.    Pertinent History Pt reports having neck, shoulder, shoulder blade pain for at least 1 year. She states the the whole left side of her head feels sore "like it's bruised." She states she gets "prickly feelings in her temples." She had a CT scan which was negative and states that her neurologist told her she thinks it's more of a  nerve problem since the CT scan didn't show anything. She reports that her medication helps the burning, stinging, prickly feeling. She states if her neck is really bothering her, she will have n/t in her LUE but it does not go past her elbow. She reports having headaches every day and if it is a severe headache, she reports she will get slightly blurred vision in her L eye. Most days it is better in the AM and gets worse as the day goes on. She denies B/B changing but does report increased nausea but she attributes that to her stomach. She reports rest and rubbing on the L side of her face make it feel better. This pain does not affect her sleep once she is asleep; sleeping on her L side is the most comfortable. She has not tried ice or heat. She can't provide any aggravating movements for her neck pain. She reports having a really bad fall 3 years ago when she tripped on a doorstop, fell onto  her knees and ended up hitting her head on 2 doors; her neck and headaches began after that fall. She is right handed. She reports she joined [employer] and she is going to begin working out at the hospital.   Patient Stated Goals decrease pain    Currently in Pain? Yes   Pain Location Shoulder   Pain Orientation Right   Pain Type Acute pain   Pain Onset 1 to 4 weeks ago   Pain Frequency Constant   Aggravating Factors  When she tries to vaccuum, mopping, movement of her RUE.        L cervical rotation - WNL, pain on R side consistent with new pain  Cervical flexion/extension - WNL and no pain R cervical rotation - decreased 10 degrees on RUE   LUE - no pain, full range of motion   RUE screened  105 degrees active motion IR - 5/5 no pain  ER/abduction, elbow flexion, elbow extension all painful on RUE  Educated patient to follow up with her referring doctor regarding this new onset pain. No residual issues from her L sided neck pain noted.                           PT Education - 11/16/16 1514    Education provided Yes   Education Details Patient needs to see MD about new onset R shoulder pain.    Person(s) Educated Patient   Methods Explanation   Comprehension Verbalized understanding             PT Long Term Goals - 11/16/16 1518      PT LONG TERM GOAL #1   Title Pt will demonstrate improved cervical rotation AROM by 10 deg bilaterally and pain-free to demonstrate improved overall function.   Baseline 11/13: R cervical rotation: 93 deg, no pain/tightness/stiffness; L cervical rotation: 90 deg, mild tightness in L shoulder blade region   Time 6   Period Weeks   Status Achieved     PT LONG TERM GOAL #2   Title Pt will be independent with HEP to promote return to function and decrease risk of reinjury.   Baseline 11/13: inconsistently performing HEP, but demonstrates and verbalizes understanding of exercises and need for performance of them    Time 6   Period Weeks   Status Partially Met     PT LONG TERM GOAL #3   Title Pt will be able to correct  and demonstrate proper sitting posture to decrease pain and improve overall function.   Baseline 11/13: able to attain and maintain proper sitting posture with verbal cueing   Time 6   Period Weeks   Status Achieved               Plan - 11/16/16 1515    Clinical Impression Statement Patient comes in for check up 1 month out from last appointment. She had been doing well before straining to reach the back of a stove and began having R shoulder pain roughly 2 weeks ago, which persists today. She is not having much if any of the symptoms she was referred in for this bout of therapy anymore. Her complaints now primarily center around her RUE and the pain she experiences at rest and with movement since the reaching injury. Patient instructed to contact MD about this. Patient has met all goals regarding her L cervical pain which she was being seen for and will be discharged, instructed to contact therapist if she has additional referral.    Rehab Potential Fair   Clinical Impairments Affecting Rehab Potential co-morbidities, time since onset, relatively sedentary lifestyle and improper posture   PT Frequency 2x / week   PT Duration 6 weeks   PT Treatment/Interventions ADLs/Self Care Home Management;Electrical Stimulation;Moist Heat;Traction;Functional mobility training;Therapeutic activities;Therapeutic exercise;Neuromuscular re-education;Patient/family education;Manual techniques;Passive range of motion;Dry needling;Taping   PT Next Visit Plan suboccipital MET, manual UT/LS/scalene stretch, soft tissue mobilization, thoracic/cervical mobilizations   PT Home Exercise Plan UT and levator scap stretch, cervical retractions   Consulted and Agree with Plan of Care Patient      Patient will benefit from skilled therapeutic intervention in order to improve the following deficits and  impairments:  Decreased mobility, Decreased range of motion, Hypomobility, Increased muscle spasms, Improper body mechanics, Postural dysfunction, Obesity, Pain  Visit Diagnosis: Cervicalgia - Plan: PT plan of care cert/re-cert     Problem List Patient Active Problem List   Diagnosis Date Noted  . COPD (chronic obstructive pulmonary disease) ([place]) 10/30/2016  . Gastroesophageal reflux disease 09/17/2016  . Degenerative arthritis of left knee 12/24/2015  . Memory loss 10/29/2015  . Osteoarthritis Knees, bilateral, moderate 06/16/2015  . Smoker 05/10/2015  . Solitary pulmonary nodule 03/28/2013  . Cardiac murmur 03/15/2012  . Hypertension   . UNSPECIFIED VITAMIN D DEFICIENCY 05/29/2009  . Pure hypercholesterolemia 12/07/2007  . Major depressive disorder, recurrent episode, in partial remission ([place]) 12/07/2007  . Diabetes mellitus type 2, controlled ([place]) 08/10/2007   Kerman Passey, PT, DPT    11/16/2016, 3:25 PM  [facility] [address] [place], [address] Phone: [phone]   Fax:  [phone]  Name: CHRISTINIA LAMBETH MRN: [id number] Date of Birth: August 24, 1951

## 2016-11-23 ENCOUNTER — Encounter: Payer: Self-pay | Admitting: Family Medicine

## 2016-11-23 ENCOUNTER — Ambulatory Visit (INDEPENDENT_AMBULATORY_CARE_PROVIDER_SITE_OTHER): Payer: Medicare Other | Admitting: Family Medicine

## 2016-11-23 VITALS — BP 146/80 | HR 79 | Temp 97.8°F | Ht 68.0 in | Wt 219.5 lb

## 2016-11-23 DIAGNOSIS — Z23 Encounter for immunization: Secondary | ICD-10-CM | POA: Diagnosis not present

## 2016-11-23 DIAGNOSIS — Z91030 Bee allergy status: Secondary | ICD-10-CM

## 2016-11-23 DIAGNOSIS — M7551 Bursitis of right shoulder: Secondary | ICD-10-CM

## 2016-11-23 DIAGNOSIS — M25511 Pain in right shoulder: Secondary | ICD-10-CM

## 2016-11-23 DIAGNOSIS — Z91038 Other insect allergy status: Secondary | ICD-10-CM

## 2016-11-23 MED ORDER — METHYLPREDNISOLONE ACETATE 40 MG/ML IJ SUSP
80.0000 mg | Freq: Once | INTRAMUSCULAR | Status: AC
  Administered 2016-11-23: 80 mg via INTRA_ARTICULAR

## 2016-11-23 NOTE — Progress Notes (Signed)
Pre visit review using our clinic review tool, if applicable. No additional management support is needed unless otherwise documented below in the visit note. 

## 2016-11-23 NOTE — Progress Notes (Signed)
Dr. Frederico Hamman T. Sherrell Weir, MD, [facility] [place], [address] Phone: [phone] Fax: [phone]  11/23/2016  Patient: GEORGA ROUTLEDGE, MRN: [id number], DOB: 1951/11/27, 65 y.o.  Primary Physician:  Owens Loffler, MD   Chief Complaint  Patient presents with  . Shoulder Pain    Right   Subjective:   Amanda [name] is a 65 y.o. very pleasant female patient who presents with the following:  Right shoulder:   Cleaning at her daughter's new place and reached up to clean the wall behind the stove and felt something really sharp and burning down into her shoulder blade. Cannot lay on the R shoulder. Sometimes numb feeling. She had some problems with this shoulder more than a year ago, but that completely resolved, and this is a new thing that's occurred within the last few weeks.  She is having some pain with abduction and to a lesser degree internal range of motion.  No known specific traumatic injury.  R subac inj  Past Medical History, Surgical History, Social History, Family History, Problem List, Medications, and Allergies have been reviewed and updated if relevant.  Patient Active Problem List   Diagnosis Date Noted  . COPD (chronic obstructive pulmonary disease) ([place]) 10/30/2016  . Gastroesophageal reflux disease 09/17/2016  . Degenerative arthritis of left knee 12/24/2015  . Memory loss 10/29/2015  . Osteoarthritis Knees, bilateral, moderate 06/16/2015  . Smoker 05/10/2015  . Solitary pulmonary nodule 03/28/2013  . Cardiac murmur 03/15/2012  . Hypertension   . UNSPECIFIED VITAMIN D DEFICIENCY 05/29/2009  . Pure hypercholesterolemia 12/07/2007  . Major depressive disorder, recurrent episode, in partial remission ([place]) 12/07/2007  . Diabetes mellitus type 2, controlled ([place]) 08/10/2007    Past Medical History:  Diagnosis Date  . Allergy    diazepam  . Allergy    bee stings  . Anxiety   . Cataract     . Constipation    uses docusate and fiber  . COPD (chronic obstructive pulmonary disease) ([facility]) 10/30/2016  . Degenerative arthritis of knee, bilateral 06/16/2015  . Depression   . Diabetes ([facility])    diet managed-type 11  . GERD (gastroesophageal reflux disease)   . Headache   . Heart murmur    slight per pt- benign   . Hiatal hernia with gastroesophageal reflux 1997  . History of tachycardia   . Hyperlipidemia   . Hypertension    hx of tachycardia   . 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 Osteoporosis   . Pneumonia    hx of this several times  . Shortness of breath dyspnea    with exertion  . Smoker 05/10/2015  . Tachycardia   . Thyroid disease    hyperthyroidism as a teenager - received some injections age 19 that corrected this   . Tobacco abuse     Past Surgical History:  Procedure Laterality Date  . ABDOMINAL HYSTERECTOMY  1993  . BACK SURGERY    . BREAST BIOPSY Right 2007  . CHOLECYSTECTOMY    . COLONOSCOPY     30 years ago was normal per pt.   . ESOPHAGEAL DILATION     x 3  . fractured leg Right   . OVARIAN CYST REMOVAL  1972  . SALIVARY GLAND SURGERY    . TONSILLECTOMY     4 yoa  . TOTAL KNEE ARTHROPLASTY Left 12/24/2015   Procedure: LEFT TOTAL KNEE  ARTHROPLASTY;  Surgeon: [name], MD;  Location: [facility];  Service: Orthopedics;  Laterality: Left;  . WRIST FRACTURE SURGERY  11/2008    Social History   Social History  . Marital status: Married    Spouse name: N/A  . Number of children: 2  . Years of education: N/A   Occupational History  . [employer]   Social History Main Topics  . Smoking status: Current Every Day Smoker    Packs/day: 0.50    Years: 45.00    Types: Cigarettes    Last attempt to quit: 12/24/2015  . Smokeless tobacco: Never Used  . Alcohol use 0.0 oz/week     Comment: glass of wine twice a month   . Drug use: No  . Sexual activity: Not on  file   Other Topics Concern  . Not on file   Social History Narrative  . No narrative on file    Family History  Problem Relation Age of Onset  . Hyperlipidemia Mother   . Arthritis Mother   . Diabetes Mother   . Heart disease Mother   . Colon polyps Mother   . Cancer Paternal Grandmother     breast  . Hypercholesterolemia Sister   . Colon polyps Sister   . Breast cancer Sister   . Colon cancer Neg Hx   . Esophageal cancer Neg Hx   . Rectal cancer Neg Hx   . Stomach cancer Neg Hx   . Pancreatic cancer Neg Hx     Allergies  Allergen Reactions  . Diazepam Other (See Comments)    REACTION: pt states she gets very angry and abusive verbally on this medication     Medication list reviewed and updated in full in [facility].  GEN: No fevers, chills. Nontoxic. Primarily MSK c/o today. MSK: Detailed in the HPI GI: tolerating PO intake without difficulty Neuro: No numbness, parasthesias, or tingling associated. Otherwise the pertinent positives of the ROS are noted above.   Objective:   BP (!) 146/80   Pulse 79   Temp 97.8 F (36.6 C) (Oral)   Ht 5\' 8"  (1.727 m)   Wt 219 lb 8 oz (99.6 kg)   BMI 33.37 kg/m    GEN: Well-developed,well-nourished,in no acute distress; alert,appropriate and cooperative throughout examination HEENT: Normocephalic and atraumatic without obvious abnormalities. Ears, externally no deformities PULM: Breathing comfortably in no respiratory distress EXT: No clubbing, cyanosis, or edema PSYCH: Normally interactive. Cooperative during the interview. Pleasant. Friendly and conversant. Not anxious or depressed appearing. Normal, full affect.  Shoulder: R Inspection: No muscle wasting or winging Ecchymosis/edema: neg  AC joint, scapula, clavicle: NT Cervical spine: NT, full ROM Spurling's: neg Abduction: full, 5/5 Flexion: full, 5/5 IR, full, lift-off: 5/5 ER at neutral: full, 5/5 AC crossover: neg Neer: pos Hawkins: pos Drop  Test: neg Empty Can: pos Supraspinatus insertion: mild-mod T Bicipital groove: NT Speed's: neg Yergason's: neg Sulcus sign: neg Scapular dyskinesis: none C5-T1 intact  Neuro: Sensation intact Grip 5/5   Radiology: No results found.  Assessment and Plan:   Acute pain of right shoulder - Plan: methylPREDNISolone acetate (DEPO-MEDROL) injection 80 mg  Need for prophylactic vaccination against Streptococcus pneumoniae (pneumococcus) - Plan: Pneumococcal polysaccharide vaccine 23-valent greater than or equal to 2yo subcutaneous/IM  Subacromial bursitis of right shoulder joint  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 sting  Rotator cuff tendinitis and subacromial bursitis secondary to increased cleaning recently.  Reviewed precautions, hands below the nose.  Basic  mechanics.  SubAC Injection, R Verbal consent was obtained from the patient. Risks (including rare infection), benefits, and alternatives were explained. Patient prepped with Chloraprep and Ethyl Chloride used for anesthesia. The subacromial space was injected using the posterior approach. The patient tolerated the procedure well and had decreased pain post injection. No complications. Injection: 8 cc of Lidocaine 1% and 2 mL of Depo-Medrol 40 mg. Needle: 22 gauge   Follow-up: No Follow-up on file.  Meds ordered this encounter  Medications  . methylPREDNISolone acetate (DEPO-MEDROL) injection 80 mg   There are no discontinued medications. Orders Placed This Encounter  Procedures  . Pneumococcal polysaccharide vaccine 23-valent greater than or equal to 2yo subcutaneous/IM    Signed,  Cortez Steelman T. Jerusalem Brownstein, MD   Allergies as of 11/23/2016      Reactions   Diazepam Other (See Comments)   REACTION: pt states she gets very angry and abusive verbally on this medication      Medication List       Accurate as of 11/23/16 11:59 PM. Always use your most recent med list.          albuterol 108 (90 Base) MCG/ACT inhaler Commonly known  as:  PROVENTIL HFA;VENTOLIN HFA Inhale 2 puffs into the lungs every 4 (four) hours as needed for wheezing or shortness of breath.   amitriptyline 50 MG tablet Commonly known as:  ELAVIL Take 2 tablets at night   aspirin 81 MG tablet Take 81 mg by mouth daily.   atorvastatin 40 MG tablet Commonly known as:  LIPITOR TAKE ONE TABLET BY MOUTH ONCE DAILY   BENADRYL 25 mg capsule Generic drug:  diphenhydrAMINE Take 25 mg by mouth 2 (two) times daily.   buPROPion 150 MG 24 hr tablet Commonly known as:  WELLBUTRIN XL Take 1 tablet (150 mg total) by mouth daily.   clonazePAM 0.5 MG tablet Commonly known as:  KLONOPIN TAKE ONE TABLET BY MOUTH TWICE DAILY AS NEEDED   cyclobenzaprine 10 MG tablet Commonly known as:  FLEXERIL TAKE ONE TABLET BY MOUTH THREE TIMES DAILY AS NEEDED FOR MUSCLE SPASM   esomeprazole 40 MG capsule Commonly known as:  NEXIUM Take 1 capsule (40 mg total) by mouth 2 (two) times daily before a meal.   FLUoxetine 40 MG capsule Commonly known as:  PROZAC TAKE TWO CAPSULES BY MOUTH ONCE DAILY   magnesium oxide 400 MG tablet Commonly known as:  MAG-OX Take 400 mg by mouth daily.   METAMUCIL PO Take 1 capsule by mouth daily.   metoprolol tartrate 25 MG tablet Commonly known as:  LOPRESSOR Take 1 tablet (25 mg total) by mouth 2 (two) times daily.   multivitamin tablet Take 1 tablet by mouth daily.   naproxen 125 MG/5ML suspension Commonly known as:  NAPROSYN Take by mouth 2 (two) times daily with a meal.   ranitidine 150 MG tablet Commonly known as:  ZANTAC Take 1 tablet (150 mg total) by mouth at bedtime.   vitamin C 1000 MG tablet Take 1,000 mg by mouth daily.   Vitamin D 1000 units capsule Take 1,000 Units by mouth 2 (two) times daily.

## 2016-11-24 ENCOUNTER — Encounter: Payer: Self-pay | Admitting: Family Medicine

## 2016-11-24 DIAGNOSIS — Z91030 Bee allergy status: Secondary | ICD-10-CM | POA: Insufficient documentation

## 2016-12-06 ENCOUNTER — Other Ambulatory Visit: Payer: Self-pay | Admitting: Family Medicine

## 2017-01-08 ENCOUNTER — Other Ambulatory Visit: Payer: Self-pay | Admitting: Family Medicine

## 2017-01-27 ENCOUNTER — Encounter: Payer: Self-pay | Admitting: Family Medicine

## 2017-01-27 ENCOUNTER — Ambulatory Visit (INDEPENDENT_AMBULATORY_CARE_PROVIDER_SITE_OTHER): Payer: Medicare Other | Admitting: Family Medicine

## 2017-01-27 VITALS — BP 124/80 | HR 86 | Temp 98.2°F | Ht 68.0 in | Wt 215.0 lb

## 2017-01-27 DIAGNOSIS — R413 Other amnesia: Secondary | ICD-10-CM

## 2017-01-27 DIAGNOSIS — R2990 Unspecified symptoms and signs involving the nervous system: Secondary | ICD-10-CM

## 2017-01-27 DIAGNOSIS — R519 Headache, unspecified: Secondary | ICD-10-CM

## 2017-01-27 DIAGNOSIS — R51 Headache: Secondary | ICD-10-CM

## 2017-01-27 DIAGNOSIS — R269 Unspecified abnormalities of gait and mobility: Secondary | ICD-10-CM | POA: Diagnosis not present

## 2017-01-27 DIAGNOSIS — R5383 Other fatigue: Secondary | ICD-10-CM

## 2017-01-27 DIAGNOSIS — K219 Gastro-esophageal reflux disease without esophagitis: Secondary | ICD-10-CM | POA: Diagnosis not present

## 2017-01-27 MED ORDER — FLUTICASONE-SALMETEROL 250-50 MCG/DOSE IN AEPB: 1.0000 | INHALATION_SPRAY | Freq: Two times a day (BID) | RESPIRATORY_TRACT | 11 refills | Status: DC

## 2017-01-27 MED ORDER — RANITIDINE HCL 150 MG PO TABS: 150.0000 mg | ORAL_TABLET | Freq: Two times a day (BID) | ORAL | 11 refills | Status: DC

## 2017-01-27 NOTE — Progress Notes (Signed)
Dr. Frederico Hamman T. Kalenna Millett, MD, [facility] [place], [address] Phone: [phone] Fax: [phone]  01/27/2017  Patient: Amanda [name], MRN: [id number], DOB: 1951/05/08, 66 y.o.  Primary Physician:  Owens Loffler, MD   Chief Complaint  Patient presents with  . Fatigue  . Shortness of Breath  . Dizziness  . Nausea  . Neck Pain  . Memory Loss   Subjective:   SHERRAE [name] is a 66 y.o. very pleasant female patient who presents with the following:  Multiple ongoing issues.  Her family is concerned, and her son Elberta Fortis is here also providing history.  Memory issues:  Having some word issues and then it will go away. Now having some issues with making a list - some difficulty.  Helping her daughter. She is having a hard time with this.  The last 2 weeks - extremely tired and weak. Gets SOB and will also lose her balance.  Headaches.  Decrease task oriented -  Feels weak, getting tired.  Getting pain in L side of head.   She also is a long-standing smoker, continues to smoke, and she has difficulty and increased short of breath with relatively short volume of exercise.  Past Medical History, Surgical History, Social History, Family History, Problem List, Medications, and Allergies have been reviewed and updated if relevant.  Patient Active Problem List   Diagnosis Date Noted  . 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 sting   . COPD (chronic obstructive pulmonary disease) ([facility]) 10/30/2016  . Gastroesophageal reflux disease 09/17/2016  . Degenerative arthritis of left knee 12/24/2015  . Memory loss 10/29/2015  . Osteoarthritis Knees, bilateral, moderate 06/16/2015  . Smoker 05/10/2015  . Solitary pulmonary nodule 03/28/2013  . Hypertension   . UNSPECIFIED VITAMIN D DEFICIENCY 05/29/2009  . Pure hypercholesterolemia 12/07/2007  . Major depressive disorder, recurrent episode, in partial remission ([place]) 12/07/2007  . Diabetes  mellitus type 2, controlled ([facility]) 08/10/2007    Past Medical History:  Diagnosis Date  . 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 sting    bee stings  . Anxiety   . Cataract   . COPD (chronic obstructive pulmonary disease) ([facility]) 10/30/2016  . Degenerative arthritis of knee, bilateral 06/16/2015  . Depression   . Diabetes ([facility])    diet managed-type 11  . GERD (gastroesophageal reflux disease)   . Hiatal hernia with gastroesophageal reflux 1997  . History of tachycardia   . Hyperlipidemia   . Hypertension    hx of tachycardia   . Neuromuscular disorder ([facility])    nerve damage in back and shoulder  . Osteoporosis   . Smoker 05/10/2015  . Thyroid disease    hyperthyroidism as a teenager - received some injections age 71 that corrected this   . Tobacco abuse     Past Surgical History:  Procedure Laterality Date  . ABDOMINAL HYSTERECTOMY  1993  . BACK SURGERY    . BREAST BIOPSY Right 2007  . CHOLECYSTECTOMY    . COLONOSCOPY     30 years ago was normal per pt.   . ESOPHAGEAL DILATION     x 3  . fractured leg Right   . OVARIAN CYST REMOVAL  1972  . SALIVARY GLAND SURGERY    . TONSILLECTOMY     4 yoa  . TOTAL KNEE ARTHROPLASTY Left 12/24/2015   Procedure: LEFT TOTAL KNEE ARTHROPLASTY;  Surgeon: [name], MD;  Location: [facility];  Service: Orthopedics;  Laterality: Left;  . WRIST  FRACTURE SURGERY  11/2008    Social History   Social History  . Marital status: Married    Spouse name: N/A  . Number of children: 2  . Years of education: N/A   Occupational History  . [employer]   Social History Main Topics  . Smoking status: Current Every Day Smoker    Packs/day: 0.50    Years: 45.00    Types: Cigarettes    Last attempt to quit: 12/24/2015  . Smokeless tobacco: Never Used  . Alcohol use 0.0 oz/week     Comment: glass of wine twice a month   . Drug use: No  . Sexual activity: Not on file   Other Topics Concern  . Not on file   Social  History Narrative  . No narrative on file    Family History  Problem Relation Age of Onset  . Hyperlipidemia Mother   . Arthritis Mother   . Diabetes Mother   . Heart disease Mother   . Colon polyps Mother   . Cancer Paternal Grandmother     breast  . Hypercholesterolemia Sister   . Colon polyps Sister   . Breast cancer Sister   . Colon cancer Neg Hx   . Esophageal cancer Neg Hx   . Rectal cancer Neg Hx   . Stomach cancer Neg Hx   . Pancreatic cancer Neg Hx     Allergies  Allergen Reactions  . Diazepam Other (See Comments)    REACTION: pt states she gets very angry and abusive verbally on this medication     Medication list reviewed and updated in full in [facility].   GEN: No acute illnesses, no fevers, chills. GI: No n/v/d, eating normally Pulm: as above Interactive and getting along well at home.  Otherwise, ROS is as per the HPI.  Objective:   BP 124/80   Pulse 86   Temp 98.2 F (36.8 C) (Oral)   Ht 5\' 8"  (1.727 m)   Wt 215 lb (97.5 kg)   SpO2 95%   BMI 32.69 kg/m   GEN: WDWN, NAD, Non-toxic, A & O x 3 HEENT: Atraumatic, Normocephalic. Neck supple. No masses, No LAD. Ears and Nose: No external deformity. CV: RRR, No M/G/R. No JVD. No thrill. No extra heart sounds. PULM: CTA B, no wheezes, crackles, rhonchi. No retractions. No resp. distress. No accessory muscle use. EXTR: No c/c/e PSYCH: Normally interactive. Conversant. Not depressed or anxious appearing.  Calm demeanor.   Neuro: CN 2-12 grossly intact. PERRLA. EOMI. Sensation intact throughout. Str 5/5 all extremities. DTR 2+. No clonus. A and o x 4. Romberg UNSTEADY. Finger nose ABNORMAL B. Heel -shin ABNORMAL. UNABLE TO WALK A STRAIGHT LINE.  PSYCH: Normally interactive. Conversant. Not depressed or anxious appearing.  Calm demeanor.     Laboratory and Imaging Data: Results for orders placed or performed in visit on [id number]  Basic metabolic panel  Result Value Ref Range   Sodium 139  135 - 145 mEq/L   Potassium 4.2 3.5 - 5.1 mEq/L   Chloride 107 96 - 112 mEq/L   CO2 30 19 - 32 mEq/L   Glucose, Bld 81 70 - 99 mg/dL   BUN 9 6 - 23 mg/dL   Creatinine, Ser 1.00 0.40 - 1.20 mg/dL   Calcium 9.7 8.4 - 10.5 mg/dL   GFR 58.83 (L) >60.00 mL/min  Vitamin B12  Result Value Ref Range   Vitamin B-12 234 211 - 911 pg/mL  CBC with Differential/Platelet  Result Value Ref Range   WBC 8.3 4.0 - 10.5 K/uL   RBC 3.74 (L) 3.87 - 5.11 Mil/uL   Hemoglobin 13.1 12.0 - 15.0 g/dL   HCT 38.6 36.0 - 46.0 %   MCV 103.2 (H) 78.0 - 100.0 fl   MCHC 33.8 30.0 - 36.0 g/dL   RDW 11.8 11.5 - 15.5 %   Platelets 198.0 150.0 - 400.0 K/uL   Neutrophils Relative % 72.0 43.0 - 77.0 %   Lymphocytes Relative 19.1 12.0 - 46.0 %   Monocytes Relative 7.1 3.0 - 12.0 %   Eosinophils Relative 1.3 0.0 - 5.0 %   Basophils Relative 0.5 0.0 - 3.0 %   Neutro Abs 6.0 1.4 - 7.7 K/uL   Lymphs Abs 1.6 0.7 - 4.0 K/uL   Monocytes Absolute 0.6 0.1 - 1.0 K/uL   Eosinophils Absolute 0.1 0.0 - 0.7 K/uL   Basophils Absolute 0.0 0.0 - 0.1 K/uL  Hepatic function panel  Result Value Ref Range   Total Bilirubin 0.3 0.2 - 1.2 mg/dL   Bilirubin, Direct 0.1 0.0 - 0.3 mg/dL   Alkaline Phosphatase 42 39 - 117 U/L   AST 17 0 - 37 U/L   ALT 14 0 - 35 U/L   Total Protein 6.5 6.0 - 8.3 g/dL   Albumin 4.2 3.5 - 5.2 g/dL  TSH  Result Value Ref Range   TSH 0.90 0.35 - 4.50 uIU/mL     Assessment and Plan:   Gait disturbance - Plan: MR Brain W Wo Contrast, Basic metabolic panel, Vitamin 123456, CBC with Differential/Platelet, Hepatic function panel, TSH  Gastroesophageal reflux disease, esophagitis presence not specified - Plan: ranitidine (ZANTAC) 150 MG tablet, MR Brain W Wo Contrast  Left-sided headache - Plan: Basic metabolic panel, Vitamin 123456, CBC with Differential/Platelet, Hepatic function panel, TSH  Memory loss - Plan: MR Brain W Wo Contrast, Basic metabolic panel, Vitamin 123456, CBC with Differential/Platelet, Hepatic  function panel, TSH  Abnormal neurological exam - Plan: MR Brain W Wo Contrast, Basic metabolic panel, Vitamin 123456, CBC with Differential/Platelet, Hepatic function panel, TSH  Other fatigue - Plan: Basic metabolic panel, Vitamin 123456, CBC with Differential/Platelet, Hepatic function panel, TSH  Memory disturbance with gait disturbance, abnormal finger-nose, abnormal Romberg, abnormal heel to toe on neurological examination.  Obtain an MRI of the brain with and without contrast to evaluate for neoplasm or subacute stroke.  Demyelinating process such as MS cannot be excluded.  Undertreated COPD with long-standing smoking.  At this point she is not ready to quit smoking.  Add Advair to daily COPD regiment.  Follow-up: additional follow-up will depend upon study findings.  Meds ordered this encounter  Medications  . ranitidine (ZANTAC) 150 MG tablet    Sig: Take 1 tablet (150 mg total) by mouth 2 (two) times daily.    Dispense:  60 tablet    Refill:  11  . Fluticasone-Salmeterol (ADVAIR DISKUS) 250-50 MCG/DOSE AEPB    Sig: Inhale 1 puff into the lungs 2 (two) times daily.    Dispense:  1 each    Refill:  11   Medications Discontinued During This Encounter  Medication Reason  . esomeprazole (NEXIUM) 40 MG capsule Cost of medication  . ranitidine (ZANTAC) 150 MG tablet Reorder   Orders Placed This Encounter  Procedures  . MR Brain W Wo Contrast  . Basic metabolic panel  . Vitamin B12  . CBC with Differential/Platelet  . Hepatic function panel  . TSH  Signed,  Maud Deed. Menucha Dicesare, MD   Allergies as of 01/27/2017      Reactions   Diazepam Other (See Comments)   REACTION: pt states she gets very angry and abusive verbally on this medication      Medication List       Accurate as of 01/27/17 11:59 PM. Always use your most recent med list.          albuterol 108 (90 Base) MCG/ACT inhaler Commonly known as:  PROVENTIL HFA;VENTOLIN HFA Inhale 2 puffs into the lungs every  4 (four) hours as needed for wheezing or shortness of breath.   amitriptyline 50 MG tablet Commonly known as:  ELAVIL Take 2 tablets at night   aspirin 81 MG tablet Take 81 mg by mouth daily.   atorvastatin 40 MG tablet Commonly known as:  LIPITOR TAKE ONE TABLET BY MOUTH ONCE DAILY   BENADRYL 25 mg capsule Generic drug:  diphenhydrAMINE Take 25 mg by mouth 2 (two) times daily.   buPROPion 150 MG 24 hr tablet Commonly known as:  WELLBUTRIN XL TAKE ONE TABLET BY MOUTH ONCE DAILY   clonazePAM 0.5 MG tablet Commonly known as:  KLONOPIN TAKE ONE TABLET BY MOUTH TWICE DAILY AS NEEDED   cyclobenzaprine 10 MG tablet Commonly known as:  FLEXERIL TAKE ONE TABLET BY MOUTH THREE TIMES DAILY AS NEEDED FOR MUSCLE SPASM   FLUoxetine 40 MG capsule Commonly known as:  PROZAC TAKE TWO CAPSULES BY MOUTH ONCE DAILY   Fluticasone-Salmeterol 250-50 MCG/DOSE Aepb Commonly known as:  ADVAIR DISKUS Inhale 1 puff into the lungs 2 (two) times daily.   magnesium oxide 400 MG tablet Commonly known as:  MAG-OX Take 400 mg by mouth daily.   METAMUCIL PO Take 1 capsule by mouth daily.   metoprolol tartrate 25 MG tablet Commonly known as:  LOPRESSOR Take 1 tablet (25 mg total) by mouth 2 (two) times daily.   multivitamin tablet Take 1 tablet by mouth daily.   naproxen 125 MG/5ML suspension Commonly known as:  NAPROSYN Take by mouth 2 (two) times daily with a meal.   ranitidine 150 MG tablet Commonly known as:  ZANTAC Take 1 tablet (150 mg total) by mouth 2 (two) times daily.   vitamin C 1000 MG tablet Take 1,000 mg by mouth daily.   Vitamin D 1000 units capsule Take 1,000 Units by mouth 2 (two) times daily.

## 2017-01-27 NOTE — Patient Instructions (Signed)

## 2017-01-27 NOTE — Progress Notes (Signed)
Pre visit review using our clinic review tool, if applicable. No additional management support is needed unless otherwise documented below in the visit note. 

## 2017-01-28 LAB — HEPATIC FUNCTION PANEL
ALT: 14 U/L (ref 0–35)
AST: 17 U/L (ref 0–37)
Albumin: 4.2 g/dL (ref 3.5–5.2)
Alkaline Phosphatase: 42 U/L (ref 39–117)
Bilirubin, Direct: 0.1 mg/dL (ref 0.0–0.3)
Total Bilirubin: 0.3 mg/dL (ref 0.2–1.2)
Total Protein: 6.5 g/dL (ref 6.0–8.3)

## 2017-01-28 LAB — BASIC METABOLIC PANEL
BUN: 9 mg/dL (ref 6–23)
CO2: 30 mEq/L (ref 19–32)
Calcium: 9.7 mg/dL (ref 8.4–10.5)
Chloride: 107 mEq/L (ref 96–112)
Creatinine, Ser: 1 mg/dL (ref 0.40–1.20)
GFR: 58.83 mL/min — ABNORMAL LOW (ref >60.00)
Glucose, Bld: 81 mg/dL (ref 70–99)
Potassium: 4.2 mEq/L (ref 3.5–5.1)
Sodium: 139 mEq/L (ref 135–145)

## 2017-01-28 LAB — CBC WITH DIFFERENTIAL/PLATELET
Basophils Absolute: 0 10*3/uL (ref 0.0–0.1)
Basophils Relative: 0.5 % (ref 0.0–3.0)
Eosinophils Absolute: 0.1 10*3/uL (ref 0.0–0.7)
Eosinophils Relative: 1.3 % (ref 0.0–5.0)
HCT: 38.6 % (ref 36.0–46.0)
Hemoglobin: 13.1 g/dL (ref 12.0–15.0)
Lymphocytes Relative: 19.1 % (ref 12.0–46.0)
Lymphs Abs: 1.6 10*3/uL (ref 0.7–4.0)
MCHC: 33.8 g/dL (ref 30.0–36.0)
MCV: 103.2 fl — ABNORMAL HIGH (ref 78.0–100.0)
Monocytes Absolute: 0.6 10*3/uL (ref 0.1–1.0)
Monocytes Relative: 7.1 % (ref 3.0–12.0)
Neutro Abs: 6 10*3/uL (ref 1.4–7.7)
Neutrophils Relative %: 72 % (ref 43.0–77.0)
Platelets: 198 10*3/uL (ref 150.0–400.0)
RBC: 3.74 Mil/uL — ABNORMAL LOW (ref 3.87–5.11)
RDW: 11.8 % (ref 11.5–15.5)
WBC: 8.3 10*3/uL (ref 4.0–10.5)

## 2017-01-28 LAB — VITAMIN B12: Vitamin B-12: 234 pg/mL (ref 211–911)

## 2017-01-28 LAB — TSH: TSH: 0.9 u[IU]/mL (ref 0.35–4.50)

## 2017-01-31 ENCOUNTER — Encounter: Payer: Self-pay | Admitting: Family Medicine

## 2017-02-01 ENCOUNTER — Ambulatory Visit (HOSPITAL_COMMUNITY)
Admission: RE | Admit: 2017-02-01 | Discharge: 2017-02-01 | Disposition: A | Discharge status: Home / Self Care | Payer: Medicare Other | Source: Ambulatory Visit | Attending: Family Medicine | Admitting: Family Medicine

## 2017-02-01 DIAGNOSIS — K219 Gastro-esophageal reflux disease without esophagitis: Secondary | ICD-10-CM

## 2017-02-01 DIAGNOSIS — R51 Headache: Secondary | ICD-10-CM | POA: Diagnosis not present

## 2017-02-01 DIAGNOSIS — R413 Other amnesia: Secondary | ICD-10-CM | POA: Diagnosis not present

## 2017-02-01 DIAGNOSIS — R269 Unspecified abnormalities of gait and mobility: Secondary | ICD-10-CM | POA: Insufficient documentation

## 2017-02-01 DIAGNOSIS — R2990 Unspecified symptoms and signs involving the nervous system: Secondary | ICD-10-CM | POA: Diagnosis not present

## 2017-02-01 MED ORDER — GADOBENATE DIMEGLUMINE 529 MG/ML IV SOLN
20.0000 mL | Freq: Once | INTRAVENOUS | Status: AC | PRN
  Administered 2017-02-01: 20 mL via INTRAVENOUS

## 2017-03-22 ENCOUNTER — Ambulatory Visit: Payer: Medicare Other | Admitting: Neurology

## 2017-03-25 ENCOUNTER — Telehealth: Payer: Self-pay | Admitting: Family Medicine

## 2017-03-25 NOTE — Telephone Encounter (Signed)
Left pt message asking to call Allison back directly at [phone] to schedule AWV.+ labs with Lesia and CPE with PCP. °

## 2017-04-09 ENCOUNTER — Ambulatory Visit (INDEPENDENT_AMBULATORY_CARE_PROVIDER_SITE_OTHER): Payer: Medicare Other | Admitting: Neurology

## 2017-04-09 ENCOUNTER — Other Ambulatory Visit: Payer: Self-pay | Admitting: Neurology

## 2017-04-09 ENCOUNTER — Encounter: Payer: Self-pay | Admitting: Neurology

## 2017-04-09 ENCOUNTER — Other Ambulatory Visit (INDEPENDENT_AMBULATORY_CARE_PROVIDER_SITE_OTHER): Payer: Medicare Other

## 2017-04-09 VITALS — BP 138/66 | HR 88 | Temp 98.7°F | Ht 68.0 in | Wt 208.0 lb

## 2017-04-09 DIAGNOSIS — G44209 Tension-type headache, unspecified, not intractable: Secondary | ICD-10-CM

## 2017-04-09 LAB — SEDIMENTATION RATE: Sed Rate: 20 mm/hr (ref 0–30)

## 2017-04-09 LAB — C-REACTIVE PROTEIN: CRP: 0.1 mg/dL — ABNORMAL LOW (ref 0.5–20.0)

## 2017-04-09 NOTE — Patient Instructions (Signed)
1. Continue amitriptyline 68m: Take 2 tablets at night 2. Bloodwork for ESR, CRP 3. Keep a calendar of your headaches, follow-up in 6 months, call for any changes

## 2017-04-09 NOTE — Progress Notes (Signed)
NEUROLOGY FOLLOW UP OFFICE NOTE  Amanda [name] [id number]  HISTORY OF PRESENT ILLNESS: I had the pleasure of seeing Amanda [name] in follow-up in the neurology clinic on 04/09/2017.  The patient was last seen 7 months ago for left facial pain/headaches. She is tolerating amitriptyline 138m qhs and report the bad headaches are much better, she has them every 1-2 weeks where it really hurts on the left side. Otherwise she has pressure on the left temporal region occurring around 3 times a week. She took Prednisone in February for lung issues and noticed the pressure on the left side disappeared. Pressure returned when the prednisone wore off. She feels like "there is something" on the left side. She takes Aleve daily for body pains, sometimes in combination with Tylenol. She had PT for neck pain and did notice it help. She has noticed more headaches when she is busy and tired, recently from moving boxes for her daughter. She had 2 falls since her last visit, no injuries.   HPI 10/25/2015: This is a pleasant 66yo RH woman with a history of hypertension, hyperlipidemia, diet-controlled diabetes, neuropathy, anxiety, who presented for evaluation of worsening memory and headaches. She started noticing worsening memory changes since last summer. She feels foggy a lot of times and feels her memory is "off the charts." She cannot recall conversations from 15 minutes prior. She has had trouble with long-term memory as well, and her family is concerned this is due to taking Prozac for many years. She feels her depression is under control as long as she takes medication. She lives with her husband and daughter, who have noticed memory changes as well. Her daughter took over bill payments 1-1/2 years ago. She reports overall compliance with medications. She denies getting lost driving. She has occasional word-finding difficulties. There is no family history of dementia. She has had several falls and head  injuries over the years. She denies any significant alcohol intake.   She reports headaches started around 2 years ago after she had a bad fall in school and fell on her knees with her head hitting the door. She has pain mostly over the left side of her head, with some tingling over the left temporal region. She feels she has to rub it to feel better. She reports the left side of her head "just feels strange," tight, as if something is under her skin making it feel tight and tender. She also reports a sore spot over the vertex. Some days her headaches are like a toothache, she feels they are like a muscle ache and rubbing it helps but sometimes tender to touch. Pain occurs around 3 times a week, lasting 2-3 days. She feels there has been deformity changes over her forehead, early this year it looked like there was a big egg on her forehead, and she now feels a ridge over her forehead. She denies any diplopia, dysarthria, dysphagia, but has occasional pain with chewing over the left jaw and back of her ear. ESR and CRP normal. She has neck and back pain, sometimes her whole left side hurts. She has had chronic spinal nerve damage since her back surgery, with stinging/burning pain that amitriptyline helps with. She has occasional numbness in both feet and bilateral leg pain, as if her "bones are bruised." Taking magnesium has helped some with this. Her mother and sister have migraines.   I personally reviewed MRI brain with and without contrast done 09/20/15 which did not show any acute  changes, there was mild chronic microvascular disease.  PAST MEDICAL HISTORY: Past Medical History:  Diagnosis Date  . 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 sting    bee stings  . Anxiety   . COPD (chronic obstructive pulmonary disease) ([facility]) 10/30/2016  . Degenerative arthritis of knee, bilateral 06/16/2015  . Depression   . Diabetes ([facility])    diet managed-type 11  . GERD (gastroesophageal reflux disease)   . Hiatal hernia with  gastroesophageal reflux 1997  . History of tachycardia   . Hyperlipidemia   . Hypertension    hx of tachycardia   . 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 Osteoporosis   . Thyroid disease    hyperthyroidism as a teenager - received some injections age 66 that corrected this   . Tobacco abuse     MEDICATIONS:  Outpatient Encounter Prescriptions as of 04/09/2017  Medication Sig  . albuterol (PROVENTIL HFA;VENTOLIN HFA) 108 (90 Base) MCG/ACT inhaler Inhale 2 puffs into the lungs every 4 (four) hours as needed for wheezing or shortness of breath.  Marland Kitchen amitriptyline (ELAVIL) 50 MG tablet Take 2 tablets at night  . Ascorbic Acid (VITAMIN C) 1000 MG tablet Take 1,000 mg by mouth daily.  Marland Kitchen aspirin 81 MG tablet Take 81 mg by mouth daily.  Marland Kitchen atorvastatin (LIPITOR) 40 MG tablet TAKE ONE TABLET BY MOUTH ONCE DAILY  . buPROPion (WELLBUTRIN XL) 150 MG 24 hr tablet TAKE ONE TABLET BY MOUTH ONCE DAILY  . Cholecalciferol (VITAMIN D) 1000 UNITS capsule Take 1,000 Units by mouth 2 (two) times daily.   . clonazePAM (KLONOPIN) 0.5 MG tablet TAKE ONE TABLET BY MOUTH TWICE DAILY AS NEEDED  . cyclobenzaprine (FLEXERIL) 10 MG tablet TAKE ONE TABLET BY MOUTH THREE TIMES DAILY AS NEEDED FOR MUSCLE SPASM  . diphenhydrAMINE (BENADRYL) 25 mg capsule Take 25 mg by mouth 2 (two) times daily.  Marland Kitchen FLUoxetine (PROZAC) 40 MG capsule TAKE TWO CAPSULES BY MOUTH ONCE DAILY  . Fluticasone-Salmeterol (ADVAIR DISKUS) 250-50 MCG/DOSE AEPB Inhale 1 puff into the lungs 2 (two) times daily.  . magnesium oxide (MAG-OX) 400 MG tablet Take 400 mg by mouth daily.  . metoprolol tartrate (LOPRESSOR) 25 MG tablet Take 1 tablet (25 mg total) by mouth 2 (two) times daily.  . Multiple Vitamin (MULTIVITAMIN) tablet Take 1 tablet by mouth daily.  . naproxen (NAPROSYN) 125 MG/5ML suspension Take by mouth 2 (two) times daily with a meal.  . Psyllium (METAMUCIL PO) Take 1 capsule by mouth daily.  . ranitidine (ZANTAC)  150 MG tablet Take 1 tablet (150 mg total) by mouth 2 (two) times daily.   Facility-Administered Encounter Medications as of 04/09/2017  Medication  . 0.9 %  sodium chloride infusion    ALLERGIES: Allergies  Allergen Reactions  . Diazepam Other (See Comments)    REACTION: pt states she gets very angry and abusive verbally on this medication     FAMILY HISTORY: Family History  Problem Relation Age of Onset  . Hyperlipidemia Mother   . Arthritis Mother   . Diabetes Mother   . Heart disease Mother   . Colon polyps Mother   . Cancer Paternal Grandmother     breast  . Hypercholesterolemia Sister   . Colon polyps Sister   . Breast cancer Sister   . Colon cancer Neg Hx   . Esophageal cancer Neg Hx   . Rectal cancer Neg Hx   . Stomach cancer Neg Hx   . Pancreatic cancer  Neg Hx     SOCIAL HISTORY: Social History   Social History  . Marital status: Married    Spouse name: N/A  . Number of children: 2  . Years of education: N/A   Occupational History  . [employer]   Social History Main Topics  . Smoking status: Current Every Day Smoker    Packs/day: 0.50    Years: 45.00    Types: Cigarettes    Last attempt to quit: 12/24/2015  . Smokeless tobacco: Never Used  . Alcohol use 0.0 oz/week     Comment: glass of wine twice a month   . Drug use: No  . Sexual activity: Not on file   Other Topics Concern  . Not on file   Social History Narrative  . No narrative on file    REVIEW OF SYSTEMS: Constitutional: No fevers, chills, or sweats, no generalized fatigue, change in appetite Eyes: No visual changes, double vision, eye pain Ear, nose and throat: No hearing loss, ear pain, nasal congestion, sore throat Cardiovascular: No chest pain, palpitations Respiratory:  No shortness of breath at rest or with exertion, wheezes GastrointestinaI: No nausea, vomiting, diarrhea, abdominal pain, fecal incontinence Genitourinary:  No  dysuria, urinary retention or frequency Musculoskeletal:  + neck pain, back pain Integumentary: No rash, pruritus, skin lesions Neurological: as above Psychiatric: No depression, insomnia, anxiety Endocrine: No palpitations, fatigue, diaphoresis, mood swings, change in appetite, change in weight, increased thirst Hematologic/Lymphatic:  No anemia, purpura, petechiae. Allergic/Immunologic: no itchy/runny eyes, nasal congestion, recent allergic reactions, rashes  PHYSICAL EXAM: Vitals:   04/09/17 1259  BP: 138/66  Pulse: 88  Temp: 98.7 F (37.1 C)   General: No acute distress, + mild left temporal and occipital tenderness Head:  Normocephalic/atraumatic Neck: supple, no paraspinal tenderness, full range of motion Heart:  Regular rate and rhythm Lungs:  Clear to auscultation bilaterally Back: No paraspinal tenderness Skin/Extremities: No rash, no edema Neurological Exam: alert and oriented to person, place, and time. No aphasia or dysarthria. Fund of knowledge is appropriate.  Recent and remote memory are intact.  Attention and concentration are normal.    Able to name objects and repeat phrases. Cranial nerves: Pupils equal, round, reactive to light. Extraocular movements intact with no nystagmus. Visual fields full. Intact facial sensation to light touch. No facial asymmetry. Tongue, uvula, palate midline.  Motor: Bulk and tone normal, muscle strength 5/5 throughout with no pronator drift.  Sensation to light touch intact.  No extinction to double simultaneous stimulation.  Deep tendon reflexes 2+ throughout, toes downgoing.  Finger to nose testing intact.  Gait narrow-based and steady, able to tandem walk adequately.  Romberg negative.  IMPRESSION: This is a pleasant 66 yo RH woman with a history of hypertension, hyperlipidemia, diet-controlled diabetes, neuropathy, with headaches localized over the left hemisphere suggestive of tension-type headaches. MRI brain unremarkable. Pain had  improved with amitriptyline 146m qhs, no side effects. She noticed improvement in the head pressure with Prednisone, previous ESR and CRP normal, we will re-check these today. We discussed adding on another preventative medication such as gabapentin or Topamax, she would like to hold off and feels the pain is tolerable with her still able to do daily activities. She will keep a calendar of her headaches and follow-up in 6 months. She knows to call for any changes.   Thank you for allowing me to participate in her care.  Please do not hesitate to call for any questions or concerns.  The duration of this appointment visit was 25 minutes of face-to-face time with the patient.  Greater than 50% of this time was spent in counseling, explanation of diagnosis, planning of further management, and coordination of care.   Ellouise Newer, M.D.   CC: Dr. Lorelei Pont

## 2017-04-09 NOTE — Addendum Note (Signed)
Addended by: Chester Holstein on: 04/09/2017 02:09 PM   Modules accepted: Orders

## 2017-04-12 ENCOUNTER — Telehealth: Payer: Self-pay

## 2017-04-12 NOTE — Telephone Encounter (Signed)
Called pt and relayed resent blood-work results.

## 2017-04-12 NOTE — Telephone Encounter (Signed)
-----   Message from Cameron Sprang, MD sent at 04/09/2017  4:29 PM EDT ----- Pls let her know bloodwork for inflammation is normal, thanks

## 2017-04-13 NOTE — Telephone Encounter (Signed)
Called pt, relayed message below.

## 2017-05-07 NOTE — Telephone Encounter (Signed)
Left pt message asking to call Allison back directly at [phone] to schedule AWV.+ labs with Lesia and CPE with PCP. °

## 2017-05-12 ENCOUNTER — Ambulatory Visit (INDEPENDENT_AMBULATORY_CARE_PROVIDER_SITE_OTHER): Payer: Medicare Other

## 2017-05-12 ENCOUNTER — Ambulatory Visit (INDEPENDENT_AMBULATORY_CARE_PROVIDER_SITE_OTHER): Payer: Medicare Other | Admitting: Orthopedic Surgery

## 2017-05-12 ENCOUNTER — Encounter (INDEPENDENT_AMBULATORY_CARE_PROVIDER_SITE_OTHER): Payer: Self-pay | Admitting: Orthopedic Surgery

## 2017-05-12 DIAGNOSIS — M25561 Pain in right knee: Secondary | ICD-10-CM

## 2017-05-12 DIAGNOSIS — G8929 Other chronic pain: Secondary | ICD-10-CM

## 2017-05-12 NOTE — Progress Notes (Signed)
   Procedure Note  Patient: Amanda [name]             Date of Birth: 1951-05-21           MRN: [id number]             Visit Date: 05/12/2017  Procedures: Visit Diagnoses: Chronic pain of right knee - Plan: XR KNEE 3 VIEW RIGHT  Large Joint Inj Date/Time: 05/12/2017 1:57 PM Performed by: Laurann Montana Authorized by: [name]   Consent Given by:  Patient Location:  Knee Site:  R knee Needle Size:  18 G Needle Length:  1.5 inches Approach:  Anterolateral Ultrasound Guidance: No   Fluoroscopic Guidance: No   Arthrogram: No   Medications:  40 mg methylPREDNISolone acetate 40 MG/ML; 4 mL bupivacaine 0.25 %

## 2017-05-15 MED ORDER — METHYLPREDNISOLONE ACETATE 40 MG/ML IJ SUSP
40.0000 mg | INTRAMUSCULAR | Status: AC | PRN
  Administered 2017-05-12: 40 mg via INTRA_ARTICULAR

## 2017-05-15 MED ORDER — BUPIVACAINE HCL 0.25 % IJ SOLN
4.0000 mL | INTRAMUSCULAR | Status: AC | PRN
  Administered 2017-05-12: 4 mL via INTRA_ARTICULAR

## 2017-05-15 NOTE — Progress Notes (Signed)
Office Visit Note   Patient: Amanda [name]           Date of Birth: 09-11-1951           MRN: [id number] Visit Date: 05/12/2017 Requested by: Owens Loffler, MD [place], [place] [address] PCP: Owens Loffler, MD  Subjective: Chief Complaint  Patient presents with  . Right Knee - Pain    HPI: Amanda [name] is a 66 year old patient with right knee pain.  She had left total knee replacement 12/24/2015.  Now describes worsening right knee pain with swelling and pain which causes her to wake from sleep at night.  He states her leg is becoming progressively the knee.  She is unable to go up stairs without pain.  She describes weakness and giving way.  Leg gets very weak and tired.  She ambulates with a cane.  She does have an MRI scan from her lumbar spine from May 2017 which shows an L3-4 H&P with mass effect on the L4 nerve root.  The pain does go up and down the leg.  This is primarily right-sided pain.  Physical therapy has helped her low back pain before.              ROS: All systems reviewed are negative as they relate to the chief complaint within the history of present illness.  Patient denies  fevers or chills.   Assessment & Plan: Visit Diagnoses:  1. Chronic pain of right knee     Plan: Impression is right knee pain with arthritis and history of L3-4 H&P from a year ago.  Plan is for injection to be performed in the knee.  Or equal return for clinical recheck decided then for or against either intervention addressing the knee pathology or potentially some shots in her back.  Follow-Up Instructions: No Follow-up on file.   Orders:  Orders Placed This Encounter  Procedures  . Large Joint Injection/Arthrocentesis  . XR KNEE 3 VIEW RIGHT   No orders of the defined types were placed in this encounter.     Procedures: No procedures performed   Clinical Data: No additional findings.  Objective: Vital Signs: There were no vitals taken for this  visit.  Physical Exam:   Constitutional: Patient appears well-developed HEENT:  Head: Normocephalic Eyes:EOM are normal Neck: Normal range of motion Cardiovascular: Normal rate Pulmonary/chest: Effort normal Neurologic: Patient is alert Skin: Skin is warm Psychiatric: Patient has normal mood and affect    Ortho Exam: Orthopedic exam demonstrates valgus alignment right leg trace effusion with palpable pedal pulses bilaterally no nerve retention signs on the right or left.  No groin pain with internal/external rotation of the leg.  Patient is palpable pulses and no other masses lymph adenopathy or skin changes noted in the back or knee region.  She has stable collateral and cruciate ligaments on the right.  The left leg is functioning well with good range of motion and no effusion in that knee.  Specialty Comments:  No specialty comments available.  Imaging: No results found.   PMFS History: Patient Active Problem List   Diagnosis Date Noted  . 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 sting   . COPD (chronic obstructive pulmonary disease) ([facility]) 10/30/2016  . Gastroesophageal reflux disease 09/17/2016  . Degenerative arthritis of left knee 12/24/2015  . Memory loss 10/29/2015  . Osteoarthritis Knees, bilateral, moderate 06/16/2015  . Smoker 05/10/2015  . Solitary pulmonary nodule 03/28/2013  . Hypertension   . UNSPECIFIED VITAMIN  D DEFICIENCY 05/29/2009  . Pure hypercholesterolemia 12/07/2007  . Major depressive disorder, recurrent episode, in partial remission ([facility]) 12/07/2007  . Diabetes mellitus type 2, controlled ([facility]) 08/10/2007   Past Medical History:  Diagnosis Date  . 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 sting    bee stings  . Anxiety   . COPD (chronic obstructive pulmonary disease) ([facility]) 10/30/2016  . Degenerative arthritis of knee, bilateral 06/16/2015  . Depression   . Diabetes ([facility])    diet managed-type 11  . GERD (gastroesophageal reflux disease)   . Hiatal hernia with gastroesophageal reflux 1997  .  History of tachycardia   . Hyperlipidemia   . Hypertension    hx of tachycardia   . Neuromuscular disorder ([facility])    nerve damage in back and shoulder  . Osteoporosis   . Thyroid disease    hyperthyroidism as a teenager - received some injections age 35 that corrected this   . Tobacco abuse     Family History  Problem Relation Age of Onset  . Hyperlipidemia Mother   . Arthritis Mother   . Diabetes Mother   . Heart disease Mother   . Colon polyps Mother   . Cancer Paternal Grandmother        breast  . Hypercholesterolemia Sister   . Colon polyps Sister   . Breast cancer Sister   . Colon cancer Neg Hx   . Esophageal cancer Neg Hx   . Rectal cancer Neg Hx   . Stomach cancer Neg Hx   . Pancreatic cancer Neg Hx     Past Surgical History:  Procedure Laterality Date  . ABDOMINAL HYSTERECTOMY  1993  . BACK SURGERY    . BREAST BIOPSY Right 2007  . CHOLECYSTECTOMY    . COLONOSCOPY     30 years ago was normal per pt.   . ESOPHAGEAL DILATION     x 3  . fractured leg Right   . OVARIAN CYST REMOVAL  1972  . SALIVARY GLAND SURGERY    . TONSILLECTOMY     4 yoa  . TOTAL KNEE ARTHROPLASTY Left 12/24/2015   Procedure: LEFT TOTAL KNEE ARTHROPLASTY;  Surgeon: [name], MD;  Location: [facility];  Service: Orthopedics;  Laterality: Left;  . WRIST FRACTURE SURGERY  11/2008   Social History   Occupational History  . [employer]   Social History Main Topics  . Smoking status: Current Every Day Smoker    Packs/day: 0.50    Years: 45.00    Types: Cigarettes    Last attempt to quit: 12/24/2015  . Smokeless tobacco: Never Used  . Alcohol use 0.0 oz/week     Comment: glass of wine twice a month   . Drug use: No  . Sexual activity: Not on file

## 2017-05-27 ENCOUNTER — Other Ambulatory Visit: Payer: Self-pay | Admitting: Family Medicine

## 2017-05-27 NOTE — Telephone Encounter (Signed)
Clonazepam called into [facility] - [place], [place] - [address] Phone: [phone].  

## 2017-05-27 NOTE — Telephone Encounter (Signed)
Ok to refill 30, 3 ref

## 2017-05-27 NOTE — Telephone Encounter (Signed)
Last office visit 01/27/2017.  Last refilled 11/09/2016 for #30 with 3 refills.  Ok to refill?

## 2017-06-02 ENCOUNTER — Ambulatory Visit (INDEPENDENT_AMBULATORY_CARE_PROVIDER_SITE_OTHER): Payer: Medicare Other

## 2017-06-02 VITALS — BP 128/80 | HR 83 | Temp 98.3°F | Ht 65.75 in | Wt 214.5 lb

## 2017-06-02 DIAGNOSIS — E119 Type 2 diabetes mellitus without complications: Secondary | ICD-10-CM

## 2017-06-02 DIAGNOSIS — I10 Essential (primary) hypertension: Secondary | ICD-10-CM | POA: Diagnosis not present

## 2017-06-02 DIAGNOSIS — E559 Vitamin D deficiency, unspecified: Secondary | ICD-10-CM

## 2017-06-02 DIAGNOSIS — Z0000 Encounter for general adult medical examination without abnormal findings: Secondary | ICD-10-CM

## 2017-06-02 DIAGNOSIS — E785 Hyperlipidemia, unspecified: Secondary | ICD-10-CM | POA: Diagnosis not present

## 2017-06-02 DIAGNOSIS — Z1159 Encounter for screening for other viral diseases: Secondary | ICD-10-CM

## 2017-06-02 LAB — HEPATIC FUNCTION PANEL
ALT: 19 U/L (ref 0–35)
AST: 18 U/L (ref 0–37)
Albumin: 4.2 g/dL (ref 3.5–5.2)
Alkaline Phosphatase: 43 U/L (ref 39–117)
Bilirubin, Direct: 0.2 mg/dL (ref 0.0–0.3)
Total Bilirubin: 0.6 mg/dL (ref 0.2–1.2)
Total Protein: 6.6 g/dL (ref 6.0–8.3)

## 2017-06-02 LAB — LIPID PANEL
Cholesterol: 178 mg/dL (ref 0–200)
HDL: 53.2 mg/dL (ref >39.00)
LDL Cholesterol: 108 mg/dL — ABNORMAL HIGH (ref 0–99)
NonHDL: 124.36
Total CHOL/HDL Ratio: 3
Triglycerides: 81 mg/dL (ref 0.0–149.0)
VLDL: 16.2 mg/dL (ref 0.0–40.0)

## 2017-06-02 LAB — BASIC METABOLIC PANEL
BUN: 10 mg/dL (ref 6–23)
CO2: 30 mEq/L (ref 19–32)
Calcium: 9.5 mg/dL (ref 8.4–10.5)
Chloride: 105 mEq/L (ref 96–112)
Creatinine, Ser: 0.77 mg/dL (ref 0.40–1.20)
GFR: 79.46 mL/min (ref >60.00)
Glucose, Bld: 106 mg/dL — ABNORMAL HIGH (ref 70–99)
Potassium: 4.4 mEq/L (ref 3.5–5.1)
Sodium: 139 mEq/L (ref 135–145)

## 2017-06-02 LAB — MICROALBUMIN / CREATININE URINE RATIO
Creatinine,U: 25.2 mg/dL
Microalb Creat Ratio: 2.8 mg/g (ref 0.0–30.0)
Microalb, Ur: <0.7 mg/dL (ref 0.0–1.9)

## 2017-06-02 LAB — CBC WITH DIFFERENTIAL/PLATELET
Basophils Absolute: 0 10*3/uL (ref 0.0–0.1)
Basophils Relative: 0.9 % (ref 0.0–3.0)
Eosinophils Absolute: 0.2 10*3/uL (ref 0.0–0.7)
Eosinophils Relative: 3 % (ref 0.0–5.0)
HCT: 37.9 % (ref 36.0–46.0)
Hemoglobin: 12.7 g/dL (ref 12.0–15.0)
Lymphocytes Relative: 26.3 % (ref 12.0–46.0)
Lymphs Abs: 1.4 10*3/uL (ref 0.7–4.0)
MCHC: 33.6 g/dL (ref 30.0–36.0)
MCV: 102.7 fl — ABNORMAL HIGH (ref 78.0–100.0)
Monocytes Absolute: 0.4 10*3/uL (ref 0.1–1.0)
Monocytes Relative: 7.7 % (ref 3.0–12.0)
Neutro Abs: 3.3 10*3/uL (ref 1.4–7.7)
Neutrophils Relative %: 62.1 % (ref 43.0–77.0)
Platelets: 249 10*3/uL (ref 150.0–400.0)
RBC: 3.69 Mil/uL — ABNORMAL LOW (ref 3.87–5.11)
RDW: 12.5 % (ref 11.5–15.5)
WBC: 5.3 10*3/uL (ref 4.0–10.5)

## 2017-06-02 LAB — HEMOGLOBIN A1C: Hgb A1c MFr Bld: 6.1 % (ref 4.6–6.5)

## 2017-06-02 LAB — VITAMIN D 25 HYDROXY (VIT D DEFICIENCY, FRACTURES): VITD: 43.96 ng/mL (ref 30.00–100.00)

## 2017-06-02 LAB — TSH: TSH: 0.7 u[IU]/mL (ref 0.35–4.50)

## 2017-06-02 NOTE — Progress Notes (Signed)
Pre visit review using our clinic review tool, if applicable. No additional management support is needed unless otherwise documented below in the visit note. 

## 2017-06-02 NOTE — Patient Instructions (Signed)
Amanda [name] , Thank you for taking time to come for your Medicare Wellness Visit. I appreciate your ongoing commitment to your health goals. Please review the following plan we discussed and let me know if I can assist you in the future.   These are the goals we discussed: Goals    . WEIGHT MANAGEMENT          Target weight is 200lbs. Starting 06/02/2017, I will attempt to walk for 30 min daily.        This is a list of the screening recommended for you and due dates:  Health Maintenance  Topic Date Due  . Complete foot exam   06/14/2017*  . Mammogram  06/02/2018*  . Eye exam for diabetics  06/02/2018*  . DEXA scan (bone density measurement)  06/02/2018*  . Tetanus Vaccine  01/09/2019*  . Flu Shot  07/07/2017  . Hemoglobin A1C  12/02/2017  . Urine Protein Check  06/02/2018  . Colon Cancer Screening  11/19/2025  .  Hepatitis C: One time screening is recommended by Center for Disease Control  (CDC) for  adults born from [birth year] through 1966.   Completed  . Pneumonia vaccines  Completed  *Topic was postponed. The date shown is not the original due date.   Preventive Care for Adults  A healthy lifestyle and preventive care can promote health and wellness. Preventive health guidelines for adults include the following key practices.  . A routine yearly physical is a good way to check with your health care provider about your health and preventive screening. It is a chance to share any concerns and updates on your health and to receive a thorough exam.  . Visit your dentist for a routine exam and preventive care every 6 months. Brush your teeth twice a day and floss once a day. Good oral hygiene prevents tooth decay and gum disease.  . The frequency of eye exams is based on your age, health, family medical history, use  of contact lenses, and other factors. Follow your health care provider's ecommendations for frequency of eye exams.  . Eat a healthy diet. Foods like vegetables, fruits,  whole grains, low-fat dairy products, and lean protein foods contain the nutrients you need without too many calories. Decrease your intake of foods high in solid fats, added sugars, and salt. Eat the right amount of calories for you. Get information about a proper diet from your health care provider, if necessary.  . Regular physical exercise is one of the most important things you can do for your health. Most adults should get at least 150 minutes of moderate-intensity exercise (any activity that increases your heart rate and causes you to sweat) each week. In addition, most adults need muscle-strengthening exercises on 2 or more days a week.  Silver Sneakers may be a benefit available to you. To determine eligibility, you may visit the website: www.silversneakers.com or contact program at 909-878-9433 Mon-Fri between 8AM-8PM.   . Maintain a healthy weight. The body mass index (BMI) is a screening tool to identify possible weight problems. It provides an estimate of body fat based on height and weight. Your health care provider can find your BMI and can help you achieve or maintain a healthy weight.   For adults 20 years and older: ? A BMI below 18.5 is considered underweight. ? A BMI of 18.5 to 24.9 is normal. ? A BMI of 25 to 29.9 is considered overweight. ? A BMI of 30 and above is considered  obese.   . Maintain normal blood lipids and cholesterol levels by exercising and minimizing your intake of saturated fat. Eat a balanced diet with plenty of fruit and vegetables. Blood tests for lipids and cholesterol should begin at age 66 and be repeated every 5 years. If your lipid or cholesterol levels are high, you are over 50, or you are at high risk for heart disease, you may need your cholesterol levels checked more frequently. Ongoing high lipid and cholesterol levels should be treated with medicines if diet and exercise are not working.  . If you smoke, find out from your health care provider  how to quit. If you do not use tobacco, please do not start.  . If you choose to drink alcohol, please do not consume more than 2 drinks per day. One drink is considered to be 12 ounces (355 mL) of beer, 5 ounces (148 mL) of wine, or 1.5 ounces (44 mL) of liquor.  . If you are 24-6 years old, ask your health care provider if you should take aspirin to prevent strokes.  . Use sunscreen. Apply sunscreen liberally and repeatedly throughout the day. You should seek shade when your shadow is shorter than you. Protect yourself by wearing long sleeves, pants, a wide-brimmed hat, and sunglasses year round, whenever you are outdoors.  . Once a month, do a whole body skin exam, using a mirror to look at the skin on your back. Tell your health care provider of new moles, moles that have irregular borders, moles that are larger than a pencil eraser, or moles that have changed in shape or color.

## 2017-06-02 NOTE — Progress Notes (Signed)
Subjective:   Amanda [name] is a 66 y.o. female who presents for an Initial Medicare Annual Wellness Visit.  Review of Systems    N/A  Cardiac Risk Factors include: advanced age (>78men, >61 women);obesity (BMI >30kg/m2);diabetes mellitus;smoking/ tobacco exposure;hypertension;dyslipidemia     Objective:    Today's Vitals   06/02/17 1325 06/02/17 1334  BP: 128/80 128/80  Pulse: 83 83  Temp: 98.3 F (36.8 C) 98.3 F (36.8 C)  TempSrc: Oral Oral  SpO2: 98% 98%  Weight: 214 lb 8 oz (97.3 kg) 214 lb 8 oz (97.3 kg)  Height: 5' 5.75" (1.67 m) 5' 5.75" (1.67 m)  PainSc: 4  4   PainLoc:  Hip   Body mass index is 34.88 kg/m.   Current Medications (verified) Outpatient Encounter Prescriptions as of 06/02/2017  Medication Sig  . albuterol (PROVENTIL HFA;VENTOLIN HFA) 108 (90 Base) MCG/ACT inhaler Inhale 2 puffs into the lungs every 4 (four) hours as needed for wheezing or shortness of breath.  Marland Kitchen amitriptyline (ELAVIL) 50 MG tablet Take 2 tablets at night  . Ascorbic Acid (VITAMIN C) 1000 MG tablet Take 1,000 mg by mouth daily.  Marland Kitchen aspirin 81 MG tablet Take 81 mg by mouth daily.  Marland Kitchen atorvastatin (LIPITOR) 40 MG tablet TAKE ONE TABLET BY MOUTH ONCE DAILY  . buPROPion (WELLBUTRIN XL) 150 MG 24 hr tablet TAKE ONE TABLET BY MOUTH ONCE DAILY  . Cholecalciferol (VITAMIN D) 1000 UNITS capsule Take 1,000 Units by mouth 2 (two) times daily.   . clonazePAM (KLONOPIN) 0.5 MG tablet TAKE ONE TABLET BY MOUTH TWICE DAILY AS NEEDED  . cyclobenzaprine (FLEXERIL) 10 MG tablet TAKE ONE TABLET BY MOUTH THREE TIMES DAILY AS NEEDED FOR MUSCLE SPASM  . diphenhydrAMINE (BENADRYL) 25 mg capsule Take 25 mg by mouth 2 (two) times daily.  Marland Kitchen FLUoxetine (PROZAC) 40 MG capsule TAKE TWO CAPSULES BY MOUTH ONCE DAILY  . Fluticasone-Salmeterol (ADVAIR DISKUS) 250-50 MCG/DOSE AEPB Inhale 1 puff into the lungs 2 (two) times daily.  . magnesium oxide (MAG-OX) 400 MG tablet Take 400 mg by mouth daily.  . metoprolol  tartrate (LOPRESSOR) 25 MG tablet Take 1 tablet (25 mg total) by mouth 2 (two) times daily.  . Multiple Vitamin (MULTIVITAMIN) tablet Take 1 tablet by mouth daily.  . naproxen (NAPROSYN) 125 MG/5ML suspension Take by mouth 2 (two) times daily with a meal.  . Psyllium (METAMUCIL PO) Take 1 capsule by mouth daily.  . ranitidine (ZANTAC) 150 MG tablet Take 1 tablet (150 mg total) by mouth 2 (two) times daily.   Facility-Administered Encounter Medications as of 06/02/2017  Medication  . 0.9 %  sodium chloride infusion    Allergies (verified) Diazepam   History: Past Medical History:  Diagnosis Date  . 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 sting    bee stings  . Anxiety   . COPD (chronic obstructive pulmonary disease) ([place]) 10/30/2016  . Degenerative arthritis of knee, bilateral 06/16/2015  . Depression   . Diabetes ([place])    diet managed-type 11  . GERD (gastroesophageal reflux disease)   . Hiatal hernia with gastroesophageal reflux 1997  . History of tachycardia   . Hyperlipidemia   . Hypertension    hx of tachycardia   . Neuromuscular disorder ([place])    nerve damage in back and shoulder  . Osteoporosis   . Thyroid disease    hyperthyroidism as a teenager - received some injections age 20 that corrected this   . Tobacco abuse    Past Surgical History:  Procedure Laterality Date  . ABDOMINAL HYSTERECTOMY  1993  . BACK SURGERY    . BREAST BIOPSY Right 2007  . CHOLECYSTECTOMY    . COLONOSCOPY     30 years ago was normal per pt.   . ESOPHAGEAL DILATION     x 3  . fractured leg Right   . OVARIAN CYST REMOVAL  1972  . SALIVARY GLAND SURGERY    . TONSILLECTOMY     4 yoa  . TOTAL KNEE ARTHROPLASTY Left 12/24/2015   Procedure: LEFT TOTAL KNEE ARTHROPLASTY;  Surgeon: [name], MD;  Location: [facility];  Service: Orthopedics;  Laterality: Left;  . WRIST FRACTURE SURGERY  11/2008   Family History  Problem Relation Age of Onset  . Hyperlipidemia Mother   . Arthritis Mother   . Diabetes Mother    . Heart disease Mother   . Colon polyps Mother   . Cancer Paternal Grandmother        breast  . Hypercholesterolemia Sister   . Colon polyps Sister   . Breast cancer Sister   . Colon cancer Neg Hx   . Esophageal cancer Neg Hx   . Rectal cancer Neg Hx   . Stomach cancer Neg Hx   . Pancreatic cancer Neg Hx    Social History   Occupational History  . [employer]   Social History Main Topics  . Smoking status: Current Every Day Smoker    Packs/day: 0.50    Years: 45.00    Types: Cigarettes    Last attempt to quit: 12/24/2015  . Smokeless tobacco: Never Used  . Alcohol use 0.0 oz/week     Comment: glass of wine twice a month   . Drug use: No  . Sexual activity: Not on file    Tobacco Counseling Ready to quit: No Counseling given: No   Activities of Daily Living In your present state of health, do you have any difficulty performing the following activities: 06/02/2017  Hearing? N  Vision? Y  Difficulty concentrating or making decisions? Y  Walking or climbing stairs? Y  Dressing or bathing? N  Doing errands, shopping? N  Preparing Food and eating ? N  Using the Toilet? N  In the past six months, have you accidently leaked urine? N  Do you have problems with loss of bowel control? N  Managing your Medications? N  Managing your Finances? N  Housekeeping or managing your Housekeeping? N  Some recent data might be hidden    Immunizations and Health Maintenance Immunization History  Administered Date(s) Administered  . Influenza Split 09/09/2011, 10/10/2012  . Influenza Whole 09/20/2006  . Influenza,inj,Quad PF,36+ Mos 09/18/2015, 09/16/2016  . PPD Test 12/27/2015  . Pneumococcal Conjugate-13 09/18/2015  . Pneumococcal Polysaccharide-23 11/23/2016  . Td 12/26/1998   There are no preventive care reminders to display for this patient.  Patient Care Team: Owens Loffler, MD as PCP - General (Family  Medicine)  Indicate any recent Medical Services you may have received from other than [facility] providers in the past year (date may be approximate).     Assessment:   This is a routine wellness examination for Brittni.   Hearing/Vision screen  Hearing Screening   125Hz  250Hz  500Hz  1000Hz  2000Hz  3000Hz  4000Hz  6000Hz  8000Hz   Right ear:   0 0 40  0    Left ear:   0 0 40  40      Visual Acuity Screening   Right eye Left eye  Both eyes  Without correction: 20/40 20/25 20/25   With correction:       Dietary issues and exercise activities discussed: Current Exercise Habits: The patient does not participate in regular exercise at present, Exercise limited by: None identified  Goals    . WEIGHT MANAGEMENT          Target weight is 200lbs. Starting 06/02/2017, I will attempt to walk for 30 min daily.       Depression Screen PHQ 2/9 Scores 06/02/2017  PHQ - 2 Score 0    Fall Risk Fall Risk  06/02/2017 04/09/2017 09/04/2016  Falls in the past year? Yes Yes Yes  Number falls in past yr: 1 2 or more -  Injury with Fall? Yes No No    Cognitive Function: MMSE - Mini Mental State Exam 06/02/2017 10/25/2015  Orientation to time 5 5  Orientation to Place 5 5  Registration 3 3  Attention/ Calculation 0 5  Recall 3 3  Language- name 2 objects 0 2  Language- repeat 1 1  Language- follow 3 step command 3 3  Language- read & follow direction 0 1  Write a sentence 0 1  Copy design 0 1  Total score 20 30     PLEASE NOTE: A Mini-Cog screen was completed. Maximum score is 20. A value of 0 denotes this part of [place] MMSE was not completed or the patient failed this part of the Mini-Cog screening.   Mini-Cog Screening Orientation to Time - Max 5 pts Orientation to Place - Max 5 pts Registration - Max 3 pts Recall - Max 3 pts Language Repeat - Max 1 pts Language Follow 3 Step Command - Max 3 pts     Screening Tests Health Maintenance  Topic Date Due  . FOOT EXAM  06/14/2017 (Originally  12/19/2011)  . MAMMOGRAM  06/02/2018 (Originally 02/21/2017)  . OPHTHALMOLOGY EXAM  06/02/2018 (Originally 05/18/1960)  . DEXA SCAN  06/02/2018 (Originally 05/19/2015)  . TETANUS/TDAP  01/09/2019 (Originally 12/26/2008)  . INFLUENZA VACCINE  07/07/2017  . HEMOGLOBIN A1C  12/02/2017  . URINE MICROALBUMIN  06/02/2018  . COLONOSCOPY  11/19/2025  . Hepatitis C Screening  Completed  . PNA vac Low Risk Adult  Completed      Plan:     I have personally reviewed and addressed the Medicare Annual Wellness questionnaire and have noted the following in the patient's chart:  A. Medical and social history B. Use of alcohol, tobacco or illicit drugs  C. Current medications and supplements D. Functional ability and status E.  Nutritional status F.  Physical activity G. Advance directives H. List of other physicians I.  Hospitalizations, surgeries, and ER visits in previous 12 months J.  [place] to include hearing, vision, cognitive, depression L. Referrals and appointments - none  In addition, I have reviewed and discussed with patient certain preventive protocols, quality metrics, and best practice recommendations. A written personalized care plan for preventive services as well as general preventive health recommendations were provided to patient.  See attached scanned questionnaire for additional information.   Signed,   Lindell Noe, MHA, BS, LPN Health Coach

## 2017-06-02 NOTE — Progress Notes (Signed)
PCP notes:   Health maintenance:  Foot exam - PCP please address at next appt Mammogram - PCP please address at next appt Bone density - PCP please address at next appt Eye exam - pt plans to schedule future appt Tetanus - postponed/insurance A1C - completed Hep C screening - completed Microalbumin - completed  Abnormal screenings:   Hearing - failed Fall risk - hx of fall with injury but no medical treatment  Patient concerns:   None  Nurse concerns:  Pt needs a well women exam with breast examination prior to mammogram and bone density.  Next PCP appt:   06/14/17 @ 0900

## 2017-06-03 LAB — HEPATITIS C ANTIBODY: HCV Ab: NEGATIVE

## 2017-06-03 NOTE — Progress Notes (Signed)
I reviewed health advisor's note, was available for consultation, and agree with documentation and plan.   Signed,  Xochilt Conant T. Sadarius Norman, MD  

## 2017-06-10 ENCOUNTER — Ambulatory Visit (INDEPENDENT_AMBULATORY_CARE_PROVIDER_SITE_OTHER): Payer: Medicare Other | Admitting: Orthopedic Surgery

## 2017-06-10 ENCOUNTER — Ambulatory Visit (INDEPENDENT_AMBULATORY_CARE_PROVIDER_SITE_OTHER): Payer: Medicare Other

## 2017-06-10 ENCOUNTER — Encounter (INDEPENDENT_AMBULATORY_CARE_PROVIDER_SITE_OTHER): Payer: Self-pay | Admitting: Orthopedic Surgery

## 2017-06-10 DIAGNOSIS — M1711 Unilateral primary osteoarthritis, right knee: Secondary | ICD-10-CM | POA: Diagnosis not present

## 2017-06-10 DIAGNOSIS — M79661 Pain in right lower leg: Secondary | ICD-10-CM

## 2017-06-10 NOTE — Progress Notes (Signed)
Office Visit Note   Patient: Amanda [name]           Date of Birth: 1951/02/08           MRN: [id number] Visit Date: 06/10/2017 Requested by: Owens Loffler, MD [place], [place] [id number] PCP: Owens Loffler, MD  Subjective: Chief Complaint  Patient presents with  . Right Knee - Pain, Follow-up, Edema    HPI: Amanda [name] is a 66 year old patient with right knee and also right leg pain.  She is walking with a cane.  She had an injection in the knee in June.  Reports some puffiness in the knee.  She's been taking Tylenol and Aleve.  She does have history of back surgery and discectomy.  Had an epidural surgery injection at that time which she states gave her some nerve damage.  She takes Elavil for that.  Not having any mechanical symptoms in the right knee.              ROS: All systems reviewed are negative as they relate to the chief complaint within the history of present illness.  Patient denies  fevers or chills.   Assessment & Plan: Visit Diagnoses:  1. Primary osteoarthritis of right knee   2. Pain in right lower leg     Plan: Impression is right knee arthritis with a possible radicular component coming from the back.  We talked about working the back up with plain x-rays MRI scanning and possible injections that she doesn't really want to do that based on that experience she's had with injections prior to her last back surgery.  The knee is getting a little bit more progressive valgus deformity of think that can account for some of the pain she is having in the mid thigh to mid calf area.  She'll get a knee replacement when she feels like she can't really go on any more.  She has had good result from the left knee replacement.  I'll see her back as needed  Follow-Up Instructions: No Follow-up on file.   Orders:  Orders Placed This Encounter  Procedures  . XR Tibia/Fibula Right   No orders of the defined types were placed in this encounter.     Procedures: No procedures performed   Clinical Data: No additional findings.  Objective: Vital Signs: There were no vitals taken for this visit.  Physical Exam:   Constitutional: Patient appears well-developed HEENT:  Head: Normocephalic Eyes:EOM are normal Neck: Normal range of motion Cardiovascular: Normal rate Pulmonary/chest: Effort normal Neurologic: Patient is alert Skin: Skin is warm Psychiatric: Patient has normal mood and affect    Ortho Exam: Orthopedic exam demonstrates full active and passive range of motion of the right knee pedal pulses intact no nerve retention signs trace effusion in the right knee is noted no groin pain with internal/external rotation leg no definite paresthesias L1 S1 bilaterally no other masses lymph adenopathy or skin changes noted in the right knee leg region.  Specialty Comments:  No specialty comments available.  Imaging: Xr Tibia/fibula Right  Result Date: 06/10/2017 AP lateral right tib-fib reviewed.  No fractures present.  Alignment normal.  Ankle mortise also symmetric without arthritis.  Lateral compartment arthritis noted in the knee.  Patellofemoral degenerative changes also present    PMFS History: Patient Active Problem List   Diagnosis Date Noted  . Pain in right lower leg 06/10/2017  . 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 sting   . COPD (chronic obstructive pulmonary  disease) ([place]) 10/30/2016  . Gastroesophageal reflux disease 09/17/2016  . Degenerative arthritis of left knee 12/24/2015  . Memory loss 10/29/2015  . Primary osteoarthritis of right knee 06/16/2015  . Smoker 05/10/2015  . Solitary pulmonary nodule 03/28/2013  . Hypertension   . UNSPECIFIED VITAMIN D DEFICIENCY 05/29/2009  . Pure hypercholesterolemia 12/07/2007  . Major depressive disorder, recurrent episode, in partial remission ([place]) 12/07/2007  . Diabetes mellitus type 2, controlled ([place]) 08/10/2007   Past Medical History:  Diagnosis Date  . 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 sting     bee stings  . Anxiety   . COPD (chronic obstructive pulmonary disease) ([place]) 10/30/2016  . Degenerative arthritis of knee, bilateral 06/16/2015  . Depression   . Diabetes ([place])    diet managed-type 11  . GERD (gastroesophageal reflux disease)   . Hiatal hernia with gastroesophageal reflux 1997  . History of tachycardia   . Hyperlipidemia   . Hypertension    hx of tachycardia   . Neuromuscular disorder ([place])    nerve damage in back and shoulder  . Osteoporosis   . Thyroid disease    hyperthyroidism as a teenager - received some injections age 65 that corrected this   . Tobacco abuse     Family History  Problem Relation Age of Onset  . Hyperlipidemia Mother   . Arthritis Mother   . Diabetes Mother   . Heart disease Mother   . Colon polyps Mother   . Cancer Paternal Grandmother        breast  . Hypercholesterolemia Sister   . Colon polyps Sister   . Breast cancer Sister   . Colon cancer Neg Hx   . Esophageal cancer Neg Hx   . Rectal cancer Neg Hx   . Stomach cancer Neg Hx   . Pancreatic cancer Neg Hx     Past Surgical History:  Procedure Laterality Date  . ABDOMINAL HYSTERECTOMY  1993  . BACK SURGERY    . BREAST BIOPSY Right 2007  . CHOLECYSTECTOMY    . COLONOSCOPY     30 years ago was normal per pt.   . ESOPHAGEAL DILATION     x 3  . fractured leg Right   . OVARIAN CYST REMOVAL  1972  . SALIVARY GLAND SURGERY    . TONSILLECTOMY     4 yoa  . TOTAL KNEE ARTHROPLASTY Left 12/24/2015   Procedure: LEFT TOTAL KNEE ARTHROPLASTY;  Surgeon: [name], MD;  Location: [facility];  Service: Orthopedics;  Laterality: Left;  . WRIST FRACTURE SURGERY  11/2008   Social History   Occupational History  . [employer]   Social History Main Topics  . Smoking status: Current Every Day Smoker    Packs/day: 0.50    Years: 45.00    Types: Cigarettes    Last attempt to quit: 12/24/2015  . Smokeless tobacco: Never Used  . Alcohol  use 0.0 oz/week     Comment: glass of wine twice a month   . Drug use: No  . Sexual activity: Not on file

## 2017-06-14 ENCOUNTER — Encounter: Payer: Self-pay | Admitting: Family Medicine

## 2017-06-14 ENCOUNTER — Ambulatory Visit (INDEPENDENT_AMBULATORY_CARE_PROVIDER_SITE_OTHER): Payer: Medicare Other | Admitting: Family Medicine

## 2017-06-14 VITALS — BP 120/80 | HR 71 | Temp 98.2°F | Ht 65.75 in | Wt 214.5 lb

## 2017-06-14 DIAGNOSIS — F17200 Nicotine dependence, unspecified, uncomplicated: Secondary | ICD-10-CM

## 2017-06-14 DIAGNOSIS — J449 Chronic obstructive pulmonary disease, unspecified: Secondary | ICD-10-CM | POA: Diagnosis not present

## 2017-06-14 DIAGNOSIS — E119 Type 2 diabetes mellitus without complications: Secondary | ICD-10-CM

## 2017-06-14 DIAGNOSIS — F3341 Major depressive disorder, recurrent, in partial remission: Secondary | ICD-10-CM | POA: Diagnosis not present

## 2017-06-14 DIAGNOSIS — I10 Essential (primary) hypertension: Secondary | ICD-10-CM

## 2017-06-14 NOTE — Progress Notes (Signed)
Dr. Frederico Hamman T. Tarshia Kot, MD, [facility] [place], [address] Phone: [phone] Fax: [phone]  06/14/2017  Patient: Amanda [name], MRN: [id number], DOB: 04-16-1951, 66 y.o.  Primary Physician:  Owens Loffler, MD   Chief Complaint  Patient presents with  . Annual Exam    Part 2   Subjective:   Amanda [name] is a 66 y.o. very pleasant female patient who presents with the following:  Was gone for 5 days recently.   Decreased klonopin use.   Pleasant patient with diet-controlled diabetes.  She is not having any difficulties with this and her blood sugars have been normal for several years.  HTN: Tolerating all medications without side effects Stable and at goal No CP, no sob. No HA.  BP Readings from Last 3 Encounters:  06/14/17 120/80  06/02/17 128/80  04/09/17 381/82    Basic Metabolic Panel:    Component Value Date/Time   NA 139 06/02/2017 1327   K 4.4 06/02/2017 1327   CL 105 06/02/2017 1327   CO2 30 06/02/2017 1327   BUN 10 06/02/2017 1327   CREATININE 0.77 06/02/2017 1327   GLUCOSE 106 (H) 06/02/2017 1327   CALCIUM 9.5 06/02/2017 1327    She continues to smoke, but her volume of cigarettes is decreased.  She also has some underlying COPD.  She is currently using Advair and is been fairly well controlled with this.  Depression and stress.  Quite a bit of psychosocial stress at home.  Her husband is not doing well.  He has Parkinson's disease as well as coronary disease and multiple other medical problems.  She is the primary caregiver.  She does not feel depressed right now.  Past Medical History, Surgical History, Social History, Family History, Problem List, Medications, and Allergies have been reviewed and updated if relevant.  Patient Active Problem List   Diagnosis Date Noted  . Pain in right lower leg 06/10/2017  . 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 sting   . COPD (chronic obstructive pulmonary  disease) ([place]) 10/30/2016  . Gastroesophageal reflux disease 09/17/2016  . Degenerative arthritis of left knee 12/24/2015  . Memory loss 10/29/2015  . Primary osteoarthritis of right knee 06/16/2015  . Smoker 05/10/2015  . Solitary pulmonary nodule 03/28/2013  . Hypertension   . UNSPECIFIED VITAMIN D DEFICIENCY 05/29/2009  . Pure hypercholesterolemia 12/07/2007  . Major depressive disorder, recurrent episode, in partial remission ([place]) 12/07/2007  . Diabetes mellitus type 2, controlled ([place]) 08/10/2007    Past Medical History:  Diagnosis Date  . 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 sting    bee stings  . Anxiety   . COPD (chronic obstructive pulmonary disease) ([place]) 10/30/2016  . Degenerative arthritis of knee, bilateral 06/16/2015  . Depression   . Diabetes ([place])    diet managed-type 11  . GERD (gastroesophageal reflux disease)   . Hiatal hernia with gastroesophageal reflux 1997  . History of tachycardia   . Hyperlipidemia   . Hypertension    hx of tachycardia   . Neuromuscular disorder ([place])    nerve damage in back and shoulder  . Osteoporosis   . Thyroid disease    hyperthyroidism as a teenager - received some injections age 63 that corrected this   . Tobacco abuse     Past Surgical History:  Procedure Laterality Date  . ABDOMINAL HYSTERECTOMY  1993  . BACK SURGERY    . BREAST BIOPSY Right 2007  . CHOLECYSTECTOMY    .  COLONOSCOPY     30 years ago was normal per pt.   . ESOPHAGEAL DILATION     x 3  . fractured leg Right   . OVARIAN CYST REMOVAL  1972  . SALIVARY GLAND SURGERY    . TONSILLECTOMY     4 yoa  . TOTAL KNEE ARTHROPLASTY Left 12/24/2015   Procedure: LEFT TOTAL KNEE ARTHROPLASTY;  Surgeon: [name], MD;  Location: [facility];  Service: Orthopedics;  Laterality: Left;  . WRIST FRACTURE SURGERY  11/2008    Social History   Social History  . Marital status: Married    Spouse name: N/A  . Number of children: 2  . Years of education: N/A   Occupational History  .  [employer]   Social History Main Topics  . Smoking status: Current Every Day Smoker    Packs/day: 0.50    Years: 45.00    Types: Cigarettes    Last attempt to quit: 12/24/2015  . Smokeless tobacco: Never Used  . Alcohol use 0.0 oz/week     Comment: glass of wine twice a month   . Drug use: No  . Sexual activity: Not on file   Other Topics Concern  . Not on file   Social History Narrative  . No narrative on file    Family History  Problem Relation Age of Onset  . Hyperlipidemia Mother   . Arthritis Mother   . Diabetes Mother   . Heart disease Mother   . Colon polyps Mother   . Cancer Paternal Grandmother        breast  . Hypercholesterolemia Sister   . Colon polyps Sister   . Breast cancer Sister   . Colon cancer Neg Hx   . Esophageal cancer Neg Hx   . Rectal cancer Neg Hx   . Stomach cancer Neg Hx   . Pancreatic cancer Neg Hx     Allergies  Allergen Reactions  . Diazepam Other (See Comments)    REACTION: pt states she gets very angry and abusive verbally on this medication     Medication list reviewed and updated in full in [place].   GEN: No acute illnesses, no fevers, chills. GI: No n/v/d, eating normally Pulm: No SOB Interactive and getting along well at home.  Otherwise, ROS is as per the HPI.  Objective:   BP 120/80   Pulse 71   Temp 98.2 F (36.8 C) (Oral)   Ht 5' 5.75" (1.67 m)   Wt 214 lb 8 oz (97.3 kg)   BMI 34.88 kg/m   GEN: WDWN, NAD, Non-toxic, A & O x 3 HEENT: Atraumatic, Normocephalic. Neck supple. No masses, No LAD. Ears and Nose: No external deformity. CV: RRR, No M/G/R. No JVD. No thrill. No extra heart sounds. PULM: CTA B, no wheezes, crackles, rhonchi. No retractions. No resp. distress. No accessory muscle use. EXTR: No c/c/e NEURO Normal gait.  PSYCH: Normally interactive. Conversant. Not depressed or anxious appearing.  Calm demeanor.   Laboratory and Imaging  Data: Results for orders placed or performed in visit on 06/02/17  Lipid Panel  Result Value Ref Range   Cholesterol 178 0 - 200 mg/dL   Triglycerides 81.0 0.0 - 149.0 mg/dL   HDL 53.20 >39.00 mg/dL   VLDL 16.2 0.0 - 40.0 mg/dL   LDL Cholesterol 108 (H) 0 - 99 mg/dL   Total CHOL/HDL Ratio 3    NonHDL 124.36   CBC with Differential/Platelet  Result Value Ref Range   WBC 5.3 4.0 - 10.5 K/uL   RBC 3.69 (L) 3.87 - 5.11 Mil/uL   Hemoglobin 12.7 12.0 - 15.0 g/dL   HCT 37.9 36.0 - 46.0 %   MCV 102.7 (H) 78.0 - 100.0 fl   MCHC 33.6 30.0 - 36.0 g/dL   RDW 12.5 11.5 - 15.5 %   Platelets 249.0 150.0 - 400.0 K/uL   Neutrophils Relative % 62.1 43.0 - 77.0 %   Lymphocytes Relative 26.3 12.0 - 46.0 %   Monocytes Relative 7.7 3.0 - 12.0 %   Eosinophils Relative 3.0 0.0 - 5.0 %   Basophils Relative 0.9 0.0 - 3.0 %   Neutro Abs 3.3 1.4 - 7.7 K/uL   Lymphs Abs 1.4 0.7 - 4.0 K/uL   Monocytes Absolute 0.4 0.1 - 1.0 K/uL   Eosinophils Absolute 0.2 0.0 - 0.7 K/uL   Basophils Absolute 0.0 0.0 - 0.1 K/uL  Basic Metabolic Panel  Result Value Ref Range   Sodium 139 135 - 145 mEq/L   Potassium 4.4 3.5 - 5.1 mEq/L   Chloride 105 96 - 112 mEq/L   CO2 30 19 - 32 mEq/L   Glucose, Bld 106 (H) 70 - 99 mg/dL   BUN 10 6 - 23 mg/dL   Creatinine, Ser 0.77 0.40 - 1.20 mg/dL   Calcium 9.5 8.4 - 10.5 mg/dL   GFR 79.46 >60.00 mL/min  Hepatic Function Panel  Result Value Ref Range   Total Bilirubin 0.6 0.2 - 1.2 mg/dL   Bilirubin, Direct 0.2 0.0 - 0.3 mg/dL   Alkaline Phosphatase 43 39 - 117 U/L   AST 18 0 - 37 U/L   ALT 19 0 - 35 U/L   Total Protein 6.6 6.0 - 8.3 g/dL   Albumin 4.2 3.5 - 5.2 g/dL  TSH  Result Value Ref Range   TSH 0.70 0.35 - 4.50 uIU/mL  Hemoglobin A1c  Result Value Ref Range   Hgb A1c MFr Bld 6.1 4.6 - 6.5 %  Microalbumin/Creatinine Ratio, Urine  Result Value Ref Range   Microalb, Ur <0.7 0.0 - 1.9 mg/dL   Creatinine,U 25.2 mg/dL   Microalb Creat Ratio 2.8 0.0 - 30.0 mg/g   Vitamin D, 25-hydroxy  Result Value Ref Range   VITD 43.96 30.00 - 100.00 ng/mL  Hepatitis C antibody  Result Value Ref Range   HCV Ab NEGATIVE NEGATIVE     Assessment and Plan:   Essential hypertension  Major depressive disorder, recurrent episode, in partial remission (HCC)  Smoker  Chronic obstructive pulmonary disease, unspecified COPD type (HCC)  Diabetes mellitus type 2, diet-controlled ([place])  Stable from a psychiatric standpoint.  Psychosocial stress is challenging in this case.  I encouraged her to stop smoking.  Diabetes is stable. She is compliant with taking her other medications.  Otherwise, she is doing reasonably okay.  She does have some chronic right-sided knee pain, but she has done well after her left knee replacement.  Follow-up: No Follow-up on file.  Future Appointments Date Time Provider [place]  10/12/2017 3:00 PM Cameron Sprang, MD [facility] None   Signed,  Frederico Hamman T. Istvan Behar, MD   Allergies as of 06/14/2017      Reactions   Diazepam Other (See Comments)   REACTION: pt states she gets very angry and abusive verbally on this medication      Medication List       Accurate as of 06/14/17 11:59 PM. Always use your most recent med  list.          albuterol 108 (90 Base) MCG/ACT inhaler Commonly known as:  PROVENTIL HFA;VENTOLIN HFA Inhale 2 puffs into the lungs every 4 (four) hours as needed for wheezing or shortness of breath.   amitriptyline 50 MG tablet Commonly known as:  ELAVIL Take 2 tablets at night   aspirin 81 MG tablet Take 81 mg by mouth daily.   atorvastatin 40 MG tablet Commonly known as:  LIPITOR TAKE ONE TABLET BY MOUTH ONCE DAILY   BENADRYL 25 mg capsule Generic drug:  diphenhydrAMINE Take 25 mg by mouth 2 (two) times daily.   buPROPion 150 MG 24 hr tablet Commonly known as:  WELLBUTRIN XL TAKE ONE TABLET BY MOUTH ONCE DAILY   clonazePAM 0.5 MG tablet Commonly known as:  KLONOPIN TAKE ONE TABLET BY  MOUTH TWICE DAILY AS NEEDED   cyclobenzaprine 10 MG tablet Commonly known as:  FLEXERIL TAKE ONE TABLET BY MOUTH THREE TIMES DAILY AS NEEDED FOR MUSCLE SPASM   FLUoxetine 40 MG capsule Commonly known as:  PROZAC TAKE TWO CAPSULES BY MOUTH ONCE DAILY   Fluticasone-Salmeterol 250-50 MCG/DOSE Aepb Commonly known as:  ADVAIR DISKUS Inhale 1 puff into the lungs 2 (two) times daily.   magnesium oxide 400 MG tablet Commonly known as:  MAG-OX Take 400 mg by mouth daily.   METAMUCIL PO Take 1 capsule by mouth daily.   metoprolol tartrate 25 MG tablet Commonly known as:  LOPRESSOR Take 1 tablet (25 mg total) by mouth 2 (two) times daily.   multivitamin tablet Take 1 tablet by mouth daily.   naproxen 125 MG/5ML suspension Commonly known as:  NAPROSYN Take by mouth 2 (two) times daily with a meal.   ranitidine 150 MG tablet Commonly known as:  ZANTAC Take 1 tablet (150 mg total) by mouth 2 (two) times daily.   vitamin C 1000 MG tablet Take 1,000 mg by mouth daily.   Vitamin D 1000 units capsule Take 1,000 Units by mouth 2 (two) times daily.

## 2017-07-07 ENCOUNTER — Other Ambulatory Visit: Payer: Self-pay | Admitting: Family Medicine

## 2017-08-10 IMAGING — CR DG SHOULDER 2+V*R*
3 series · 3 of 3 positions shown · non-contrast
Comparison: None in PACs

CLINICAL DATA: Direct right shoulder trauma by a falling tree limb.

EXAM:
RIGHT SHOULDER - 2+ VIEW

[view not recorded (1 of 3)]
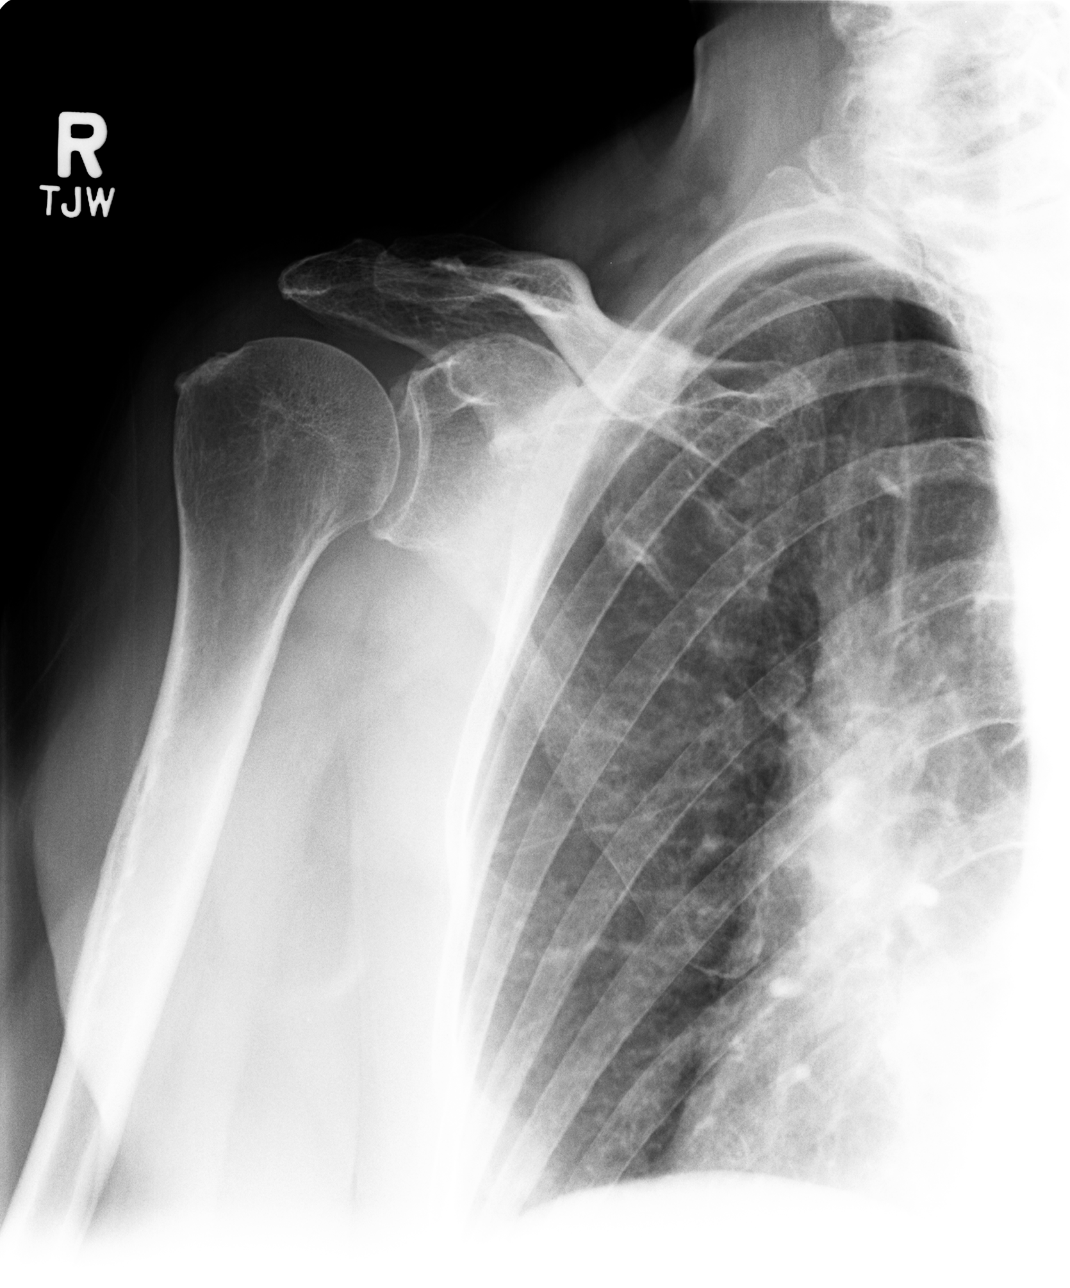

[view not recorded (2 of 3)]
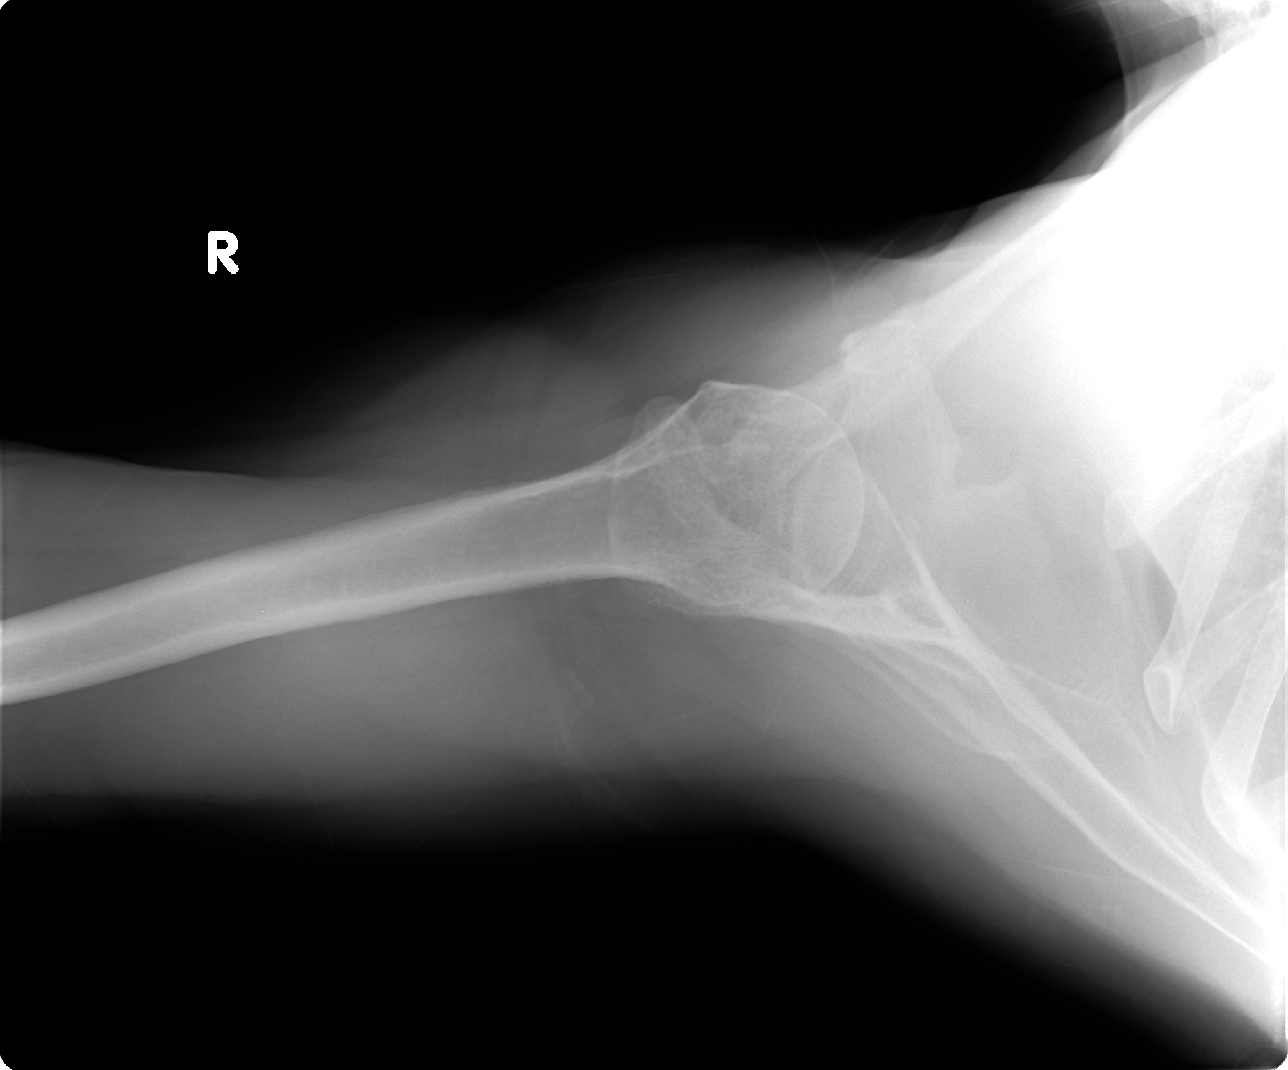

[view not recorded (3 of 3)]
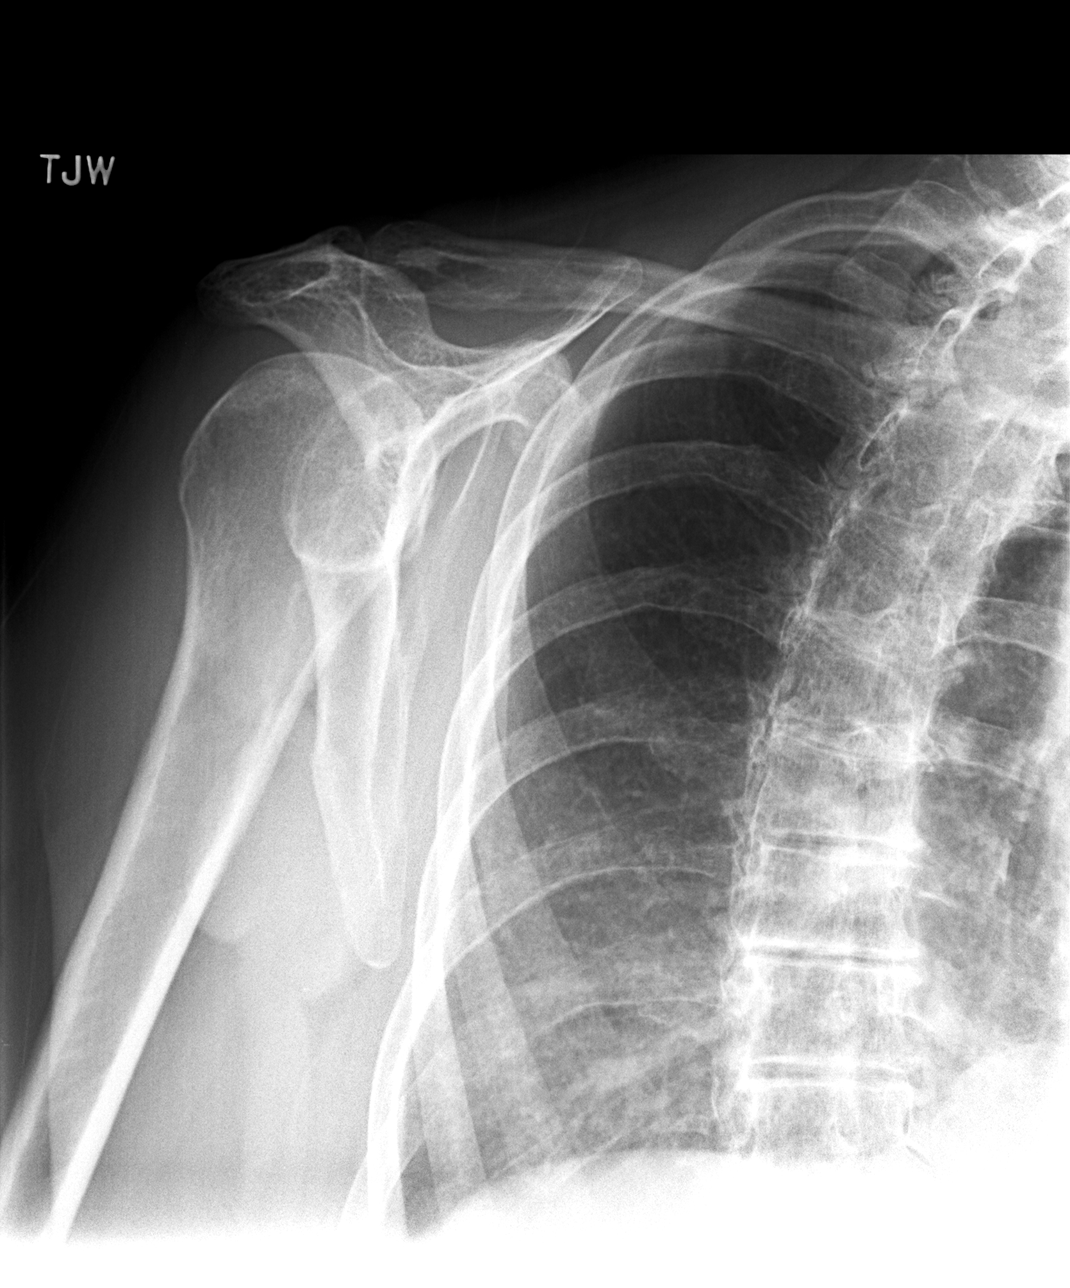

[3 of 3 positions shown; findings below may reference images not displayed]

FINDINGS: The bones of the shoulder are adequately mineralized. There is some
irregularity of the greater tuberosity of the humerus. There is no
Bankart lesion of the glenoid. The glenohumeral and AC joints are
grossly normal. The observed portions of the right clavicle and
upper right ribs are normal.
IMPRESSION: There is no acute bony abnormality of the right shoulder. Mild
degenerative change associated with the greater tuberosity is
suspected. If the patient's symptoms warrant further evaluation, MRI
would be a useful next imaging modality.

## 2017-08-13 ENCOUNTER — Other Ambulatory Visit: Payer: Self-pay | Admitting: Family Medicine

## 2017-08-23 ENCOUNTER — Telehealth: Payer: Self-pay | Admitting: *Deleted

## 2017-08-23 NOTE — Telephone Encounter (Signed)
Received fax from [employer] requesting order for diabetic testing supplies.  Spoke with Amanda [name] and she would like to get supplies through this company.  Form completed and faxed back to [phone].

## 2017-09-12 ENCOUNTER — Other Ambulatory Visit: Payer: Self-pay | Admitting: Neurology

## 2017-09-12 DIAGNOSIS — R519 Headache, unspecified: Secondary | ICD-10-CM

## 2017-09-12 DIAGNOSIS — R51 Headache: Principal | ICD-10-CM

## 2017-09-13 ENCOUNTER — Ambulatory Visit (INDEPENDENT_AMBULATORY_CARE_PROVIDER_SITE_OTHER)
Admission: RE | Admit: 2017-09-13 | Discharge: 2017-09-13 | Disposition: A | Discharge status: Home / Self Care | Payer: Medicare Other | Source: Ambulatory Visit | Attending: Family Medicine | Admitting: Family Medicine

## 2017-09-13 ENCOUNTER — Ambulatory Visit
Admission: RE | Admit: 2017-09-13 | Discharge: 2017-09-13 | Disposition: A | Discharge status: Home / Self Care | Payer: Medicare Other | Source: Ambulatory Visit | Attending: Family Medicine | Admitting: Family Medicine

## 2017-09-13 ENCOUNTER — Ambulatory Visit (INDEPENDENT_AMBULATORY_CARE_PROVIDER_SITE_OTHER): Payer: Medicare Other | Admitting: Family Medicine

## 2017-09-13 ENCOUNTER — Telehealth: Payer: Self-pay | Admitting: Family Medicine

## 2017-09-13 VITALS — BP 120/70 | HR 79 | Temp 98.3°F | Ht 65.75 in | Wt 220.0 lb

## 2017-09-13 DIAGNOSIS — S060X0A Concussion without loss of consciousness, initial encounter: Secondary | ICD-10-CM

## 2017-09-13 DIAGNOSIS — H538 Other visual disturbances: Secondary | ICD-10-CM

## 2017-09-13 DIAGNOSIS — S80212A Abrasion, left knee, initial encounter: Secondary | ICD-10-CM | POA: Diagnosis not present

## 2017-09-13 DIAGNOSIS — T07XXXA Unspecified multiple injuries, initial encounter: Secondary | ICD-10-CM | POA: Diagnosis not present

## 2017-09-13 DIAGNOSIS — S80211A Abrasion, right knee, initial encounter: Secondary | ICD-10-CM

## 2017-09-13 DIAGNOSIS — M25561 Pain in right knee: Secondary | ICD-10-CM

## 2017-09-13 DIAGNOSIS — S0231XA Fracture of orbital floor, right side, initial encounter for closed fracture: Secondary | ICD-10-CM | POA: Diagnosis not present

## 2017-09-13 DIAGNOSIS — M25531 Pain in right wrist: Secondary | ICD-10-CM | POA: Diagnosis not present

## 2017-09-13 DIAGNOSIS — S0281XA Fracture of other specified skull and facial bones, right side, initial encounter for closed fracture: Secondary | ICD-10-CM | POA: Diagnosis not present

## 2017-09-13 DIAGNOSIS — Z23 Encounter for immunization: Secondary | ICD-10-CM

## 2017-09-13 DIAGNOSIS — W19XXXA Unspecified fall, initial encounter: Secondary | ICD-10-CM | POA: Insufficient documentation

## 2017-09-13 DIAGNOSIS — M25562 Pain in left knee: Secondary | ICD-10-CM

## 2017-09-13 DIAGNOSIS — R51 Headache: Secondary | ICD-10-CM | POA: Diagnosis not present

## 2017-09-13 DIAGNOSIS — S6991XA Unspecified injury of right wrist, hand and finger(s), initial encounter: Secondary | ICD-10-CM | POA: Diagnosis not present

## 2017-09-13 DIAGNOSIS — M7989 Other specified soft tissue disorders: Secondary | ICD-10-CM | POA: Diagnosis not present

## 2017-09-13 DIAGNOSIS — M179 Osteoarthritis of knee, unspecified: Secondary | ICD-10-CM | POA: Diagnosis not present

## 2017-09-13 DIAGNOSIS — S8992XA Unspecified injury of left lower leg, initial encounter: Secondary | ICD-10-CM | POA: Diagnosis not present

## 2017-09-13 NOTE — Telephone Encounter (Signed)
I spoke with Amanda [name] and she fell and hit head, did not lose consciousness; h/a today; no vision changes and no vomiting. Rt eye is purple, both knees hurt with abrasions. Amanda [name] refused to go to ED when talking with team health; Amanda [name] only wanted to see Dr Lorelei Pont. Amanda [name] has appt with Dr Lorelei Pont 09/13/17 at 12:15. FYI to Dr Lorelei Pont.

## 2017-09-13 NOTE — Telephone Encounter (Signed)
Patient Name: [facility] [place]  DOB: 05/19/1951    Initial Comment Caller states had bad fall yesterday afternoon; Wants appt to have eye-blurry vision and swollen and purple looking; has headache, both knees and right wrist checked; Hit head on cement; Caller state she seems to be "foggy headded" this morning; back pain;    Nurse Assessment  Nurse: Raphael Gibney, RN, Vanita Ingles Date/Time (Eastern Time): 09/13/2017 8:21:24 AM  Confirm and document reason for call. If symptomatic, describe symptoms. ---Caller states she fell yesterday around 3:30 pm on concrete driveway. Hit her head. Her right eye is swollen and purple. Has a headache. Has scrapes on both knees and outside of right hand. has pain in both knees and right hand.  Does the patient have any new or worsening symptoms? ---Yes  Will a triage be completed? ---Yes  Related visit to physician within the last 2 weeks? ---No  Does the PT have any chronic conditions? (i.e. diabetes, asthma, etc.) ---Yes  List chronic conditions. ---A fib  Is this a behavioral health or substance abuse call? ---No     Guidelines    Guideline Title Affirmed Question Affirmed Notes  Head Injury One or two "black eyes" (bruising, purple color of eyelids)    Final Disposition User   Go to ED Now Raphael Gibney, RN, Vanita Ingles    Comments  pt states she is not going to the ER. Wants tomake appt instead.  called office and spoke to [employer] and gave report that pt fell yesterday afternoon on a concrete driveway. Hit her head, has pain in her right hand and both knees. Right eye is purple and swollen. Triage outcome of go to ER now but but does not want to go to the ER.   Referrals  GO TO FACILITY REFUSED   Caller Disagree/Comply Disagree  Caller Understands Yes  PreDisposition Call Doctor
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
## 2017-09-13 NOTE — Patient Instructions (Signed)
TALK TO [facility] ABOUT YOUR REFERRALS   HEAD INJURY / CONCUSSSION:   MOST PEOPLE RECOVER FINE AND COMPLETELY FROM A CONCUSSION, BUT THE MOST IMPORTANT THING IS VERY EARLY COMPLETE REST SO THAT THE BRAN CAN RECOVER.  COMPLETE PHYSICAL AND MENTAL REST IS NEEDED.  THAT MEANS: NO SCHOOL OR WORK UNTIL YOU ARE BETTER NO PHYSICAL EXERTION AT ALL UNTIL YOU HAVE NO SYMPTOMS NO MENTAL EXERTION, MEANING NO WORK, NO HOMEWORK, NO TEST TAKING.  NO DRIVING UNTIL YOU ARE ASYMPTOMATIC.  NO VIDEO GAMES, NO USING THE COMPUTER, NO TEXTING, NO USING SMARTPHONES, NO USE OF AN IPAD OR TABLET. DO NOT GO TO A MOVIE THEATRE OR WATCH SPORTS ON TV. HDTV TENDS TO MAKE PEOPLE FEEL WORSE.   IN OTHER WORDS, DO NOT DO ANYTHING. SIT AND CALMLY REST UNTIL YOU FEEL BETTER. SLEEP IS OK. YOU CAN HANG OUT AND TALK TO A FRIEND.  IT IS DIFFICULT TO KNOW HOW QUICKLY YOU WILL RECOVER. SOME PEOPLE FEEL BETTER IN A FEW DAYS, WHILE OTHER PEOPLE HAVE SYMPTOMS THAT CAN LAST FOR WEEKS TO MONTHS.  EARLY REST IS BY FAR THE MOST IMPORTANT THING.  If any of the following occur notify your physician or go to the Hospital Emergency Department - if markedly worsening:  . Increased drowsiness, stupor or loss of consciousness . Restlessness or convulsions (fits) . Paralysis in arms or legs . Temperature above 100 F . Vomiting . Severe headache . Blood or clear fluid dripping from the nose or ears . Stiffness of the neck . Dizziness or blurred vision . Pulsating pain in the eye . Unequal pupils of eye . Personality changes . Any other unusual symptoms  PRECAUTIONS . Keep head elevated at all times for the first 24 hours (Elevate mattress if pillow is ineffective) . Do not take tranquilizers, sedatives, narcotics or alcohol . Avoid aspirin. Use only acetaminophen (e.g. Tylenol) or ibuprofen (e.g. Advil) for relief of pain. Follow directions on the bottle for dosage. . Use ice packs for comfort  MEDICATIONS Use medications  only as directed by your physician  Concussion Direct trauma to the head often causes a condition known as a concussion. This injury will interfere with brain function and may cause you to lose consciousness. The consequences of a concussion are usually temporary, but repetitive concussions can be very dangerous. If you have multiple concussions, you will have a greater risk of long-term effects, such as slurred speech, slow movements, impaired thinking, or tremors. The severity of a concussion is based on the length and severity of the interference with brain activity.  SYMPTOMS  Symptoms of a concussion vary depending on the severity of the injury. Very mild concussions may even occur without any noticeable symptoms. Swelling in the area of the injury is not related to the seriousness of the injury.   CAUSES  A concussion is the result of trauma to the head. When the head is subjected to such an injury, the brain strikes against the inner wall of the skull. This impact is what causes the damage to the brain. The force of injury is related to severity of injury. The most severe concussions are associated with incidents that involve large impact forces such as motor vehicle accidents. Wearing a helmet will reduce the severity of trauma to the head, but concussions may still occur if you are wearing a helmet.  RISK INCREASES WITH:  Contact sports (football, hockey, rugby, or lacrosse).  Fighting sports (martial arts or boxing).  Riding bicycles, motorcycles, or  horses (when you ride without a helmet).   PREVENTION  Wear proper protective headgear and ensure correct fit.  Wear seat belts when driving and riding in a car.  Do not drink or use mind-altering drugs and drive.   PROGNOSIS  Concussions are typically curable if they are recognized and treated early. If a severe concussion or multiple concussions go untreated, then the complications may be life-threatening or cause permanent  disability and brain damage.  RELATED COMPLICATIONS   Permanent brain damage (slurred speech, slow movement, impaired thinking, or tremors).  Bleeding under the skull (subdural hemorrhage or hematoma, epidural hematoma).  Bleeding into the brain.  Prolonged healing time if usual activities are resumed too soon.  Infection if skin over the concussion site is broken.  Increased risk of future concussions (less trauma is required for a second concussion than the first).

## 2017-09-14 ENCOUNTER — Encounter: Payer: Self-pay | Admitting: Family Medicine

## 2017-09-14 DIAGNOSIS — E119 Type 2 diabetes mellitus without complications: Secondary | ICD-10-CM | POA: Diagnosis not present

## 2017-09-16 ENCOUNTER — Telehealth: Payer: Self-pay | Admitting: *Deleted

## 2017-09-16 NOTE — Telephone Encounter (Signed)
I need someone to confirm this is not for her husband who is seeing dr. Lorin Mercy for a fracture.   They are on [employer] and can see the films through their software. This should not be needed.   If she really wants them, please ask Terri to do it.

## 2017-09-16 NOTE — Telephone Encounter (Signed)
Terri can you burn a disc for Amanda [name] of her recent knee x-rays.  She is seeing Dr. Lorelei Pont on Monday and would like to pick up the disc at that time.

## 2017-09-16 NOTE — Telephone Encounter (Signed)
Spoke to pt who states she recently had xrays completed and are needing them placed on a disc and sent to [facility] orthopaedic.

## 2017-09-16 NOTE — Telephone Encounter (Signed)
Disc has been made and is in box in the front office

## 2017-09-20 ENCOUNTER — Ambulatory Visit (INDEPENDENT_AMBULATORY_CARE_PROVIDER_SITE_OTHER)
Admission: RE | Admit: 2017-09-20 | Discharge: 2017-09-20 | Disposition: A | Discharge status: Home / Self Care | Payer: Medicare Other | Source: Ambulatory Visit | Attending: Family Medicine | Admitting: Family Medicine

## 2017-09-20 ENCOUNTER — Ambulatory Visit (INDEPENDENT_AMBULATORY_CARE_PROVIDER_SITE_OTHER): Payer: Medicare Other | Admitting: Family Medicine

## 2017-09-20 ENCOUNTER — Encounter: Payer: Self-pay | Admitting: Family Medicine

## 2017-09-20 VITALS — BP 120/72 | HR 81 | Temp 98.2°F | Ht 65.75 in | Wt 221.5 lb

## 2017-09-20 DIAGNOSIS — S6992XA Unspecified injury of left wrist, hand and finger(s), initial encounter: Secondary | ICD-10-CM | POA: Diagnosis not present

## 2017-09-20 DIAGNOSIS — S060X0D Concussion without loss of consciousness, subsequent encounter: Secondary | ICD-10-CM

## 2017-09-20 DIAGNOSIS — M79641 Pain in right hand: Secondary | ICD-10-CM

## 2017-09-20 DIAGNOSIS — S0231XA Fracture of orbital floor, right side, initial encounter for closed fracture: Secondary | ICD-10-CM | POA: Diagnosis not present

## 2017-09-20 DIAGNOSIS — M25561 Pain in right knee: Secondary | ICD-10-CM

## 2017-09-20 DIAGNOSIS — S6991XA Unspecified injury of right wrist, hand and finger(s), initial encounter: Secondary | ICD-10-CM | POA: Diagnosis not present

## 2017-09-20 NOTE — Progress Notes (Signed)
Dr. Frederico Hamman T. Rylee Huestis, MD, [facility] [place], [address] Phone: [phone] Fax: [phone]  09/20/2017  Patient: Amanda [name], MRN: [id number], DOB: 31-Jan-1951, 66 y.o.  Primary Physician:  Owens Loffler, MD   Chief Complaint  Patient presents with  . Follow-up    Concussion   Subjective:   LEELA VANBROCKLIN is a 66 y.o. very pleasant female patient who presents with the following:  F/u concussion and R orbital floor fracture: Origninally seen 1 week ago and found to have R orbital floor fracture, bad concussion, multiple MSK c/o after fall.   1. Hand, head, and HA posterior. Irritability and anxiety is improving, balance is better but not there, HA are improving. Still with R blurred vision. Dep is ok.   2. R hand and wrist are hurting a lot. Has been in a forarm splint, but still with pain.  Few in the last week.  2-3 weeks, f/u with optho.   A little wombly with Romberg.  Past Medical History, Surgical History, Social History, Family History, Problem List, Medications, and Allergies have been reviewed and updated if relevant.  Patient Active Problem List   Diagnosis Date Noted  . 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 sting   . COPD (chronic obstructive pulmonary disease) ([place]) 10/30/2016  . Gastroesophageal reflux disease 09/17/2016  . Degenerative arthritis of left knee 12/24/2015  . Memory loss 10/29/2015  . Primary osteoarthritis of right knee 06/16/2015  . Smoker 05/10/2015  . Solitary pulmonary nodule 03/28/2013  . Hypertension   . UNSPECIFIED VITAMIN D DEFICIENCY 05/29/2009  . Pure hypercholesterolemia 12/07/2007  . Major depressive disorder, recurrent episode, in partial remission ([place]) 12/07/2007  . Diabetes mellitus type 2, controlled ([place]) 08/10/2007    Past Medical History:  Diagnosis Date  . 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 sting    bee stings  . Anxiety   . COPD (chronic obstructive pulmonary disease) ([place])  10/30/2016  . Degenerative arthritis of knee, bilateral 06/16/2015  . Depression   . Diabetes ([place])    diet managed-type 11  . GERD (gastroesophageal reflux disease)   . Hiatal hernia with gastroesophageal reflux 1997  . History of tachycardia   . Hyperlipidemia   . Hypertension    hx of tachycardia   . 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 Osteoporosis   . Thyroid disease    hyperthyroidism as a teenager - received some injections age 76 that corrected this   . Tobacco abuse     Past Surgical History:  Procedure Laterality Date  . ABDOMINAL HYSTERECTOMY  1993  . BACK SURGERY    . BREAST BIOPSY Right 2007  . CHOLECYSTECTOMY    . COLONOSCOPY     30 years ago was normal per pt.   . ESOPHAGEAL DILATION     x 3  . fractured leg Right   . OVARIAN CYST REMOVAL  1972  . SALIVARY GLAND SURGERY    . TONSILLECTOMY     4 yoa  . TOTAL KNEE ARTHROPLASTY Left 12/24/2015   Procedure: LEFT TOTAL KNEE ARTHROPLASTY;  Surgeon: [name], MD;  Location: [facility];  Service: Orthopedics;  Laterality: Left;  . WRIST FRACTURE SURGERY  11/2008    Social History   Social History  . Marital status: Married    Spouse name: N/A  . Number of children: 2  . Years of education: N/A   Occupational History  . [employer]   Social History Main Topics  . Smoking status: Current Every Day Smoker    Packs/day: 0.50    Years: 45.00    Types: Cigarettes    Last attempt to quit: 12/24/2015  . Smokeless tobacco: Never Used  . Alcohol use 0.0 oz/week     Comment: glass of wine twice a month   . Drug use: No  . Sexual activity: Not on file   Other Topics Concern  . Not on file   Social History Narrative  . No narrative on file    Family History  Problem Relation Age of Onset  . Hyperlipidemia Mother   . Arthritis Mother   . Diabetes Mother   . Heart disease Mother   . Colon polyps Mother   . Cancer  Paternal Grandmother        breast  . Hypercholesterolemia Sister   . Colon polyps Sister   . Breast cancer Sister   . Colon cancer Neg Hx   . Esophageal cancer Neg Hx   . Rectal cancer Neg Hx   . Stomach cancer Neg Hx   . Pancreatic cancer Neg Hx     Allergies  Allergen Reactions  . Diazepam Other (See Comments)    REACTION: pt states she gets very angry and abusive verbally on this medication     Medication list reviewed and updated in full in [place].   GEN: No acute illnesses, no fevers, chills. GI: No n/v/d, eating normally Pulm: No SOB Interactive and getting along well at home.  Otherwise, ROS is as per the HPI.  Objective:   BP 120/72   Pulse 81   Temp 98.2 F (36.8 C) (Oral)   Ht 5' 5.75" (1.67 m)   Wt 221 lb 8 oz (100.5 kg)   BMI 36.02 kg/m   GEN: WDWN, NAD, Non-toxic, A & O x 3 HEENT: Atraumatic, Normocephalic. Neck supple. No masses, No LAD. Ears and Nose: No external deformity. EXTR: No c/c/e NEURO Normal gait.  PSYCH: Normally interactive. Conversant. Not depressed or anxious appearing.  Calm demeanor.   Hand: R Ecchymosis or edema: neg ROM wrist/hand/digits/elbow: full  Carpals, MCP's, digits: 3-5 MCP are mildly ttp and swollen Distal Ulna and Radius: NT Supination lift test: neg Cysts/nodules: neg Finkelstein's test: neg Snuffbox tenderness: neg Scaphoid tubercle: NT Hook of [place]: NT Resisted supination: NT Full composite fist Grip, all digits: 5/5 str No tenosynovitis Axial load test: neg Atrophy: neg  Hand sensation: intact   Neuro: CN 2-12 grossly intact. PERRLA. EOMI. Sensation intact throughout. Str 5/5 all extremities. DTR 2+. No clonus. A and o x 4. Romberg unsteady. Finger nose neg.   Laboratory and Imaging Data: Dg Wrist Complete Right  Result Date: 09/13/2017 CLINICAL DATA:  66 year old female status post fall with acute right wrist pain. EXAM: RIGHT WRIST - COMPLETE 3+ VIEW COMPARISON:  None. FINDINGS: Soft  tissue swelling about the wrist. Distal radius and ulna appear intact. Carpal bone alignment and joint spaces within normal limits. Proximal metacarpals appear intact. Bone mineralization is within normal limits for age. IMPRESSION: Soft tissue swelling but no acute fracture or dislocation identified about the right wrist. Electronically Signed   By: Genevie Ann M.D.   On: 09/13/2017 13:47   Ct Head Wo Contrast  Result Date: 09/13/2017 CLINICAL DATA:  Headache and blurred vision after fall. EXAM: CT HEAD WITHOUT CONTRAST TECHNIQUE: Contiguous axial images were obtained from the base of the skull through  the vertex without intravenous contrast. COMPARISON:  MRI brain dated February 01, 2017. FINDINGS: Brain: No evidence of acute infarction, hemorrhage, hydrocephalus, extra-axial collection or mass lesion/mass effect. Vascular: Atherosclerotic vascular calcification of the carotid siphons. No hyperdense vessel. Skull: No skull fracture or focal lesion. Sinuses/Orbits: There is a depressed fracture of the right inferior orbital wall, possibly involving the infraorbital foramen. Tiny focus of right intraorbital air. No definite herniation of intraorbital contents. The left orbit is unremarkable. Small amount of fluid in the left mastoid air cells. The paranasal sinuses and right mastoid air cells are clear. Other: Small amount of subcutaneous gas in the right temporal fossa. IMPRESSION: 1. Depressed fracture of the right inferior orbital wall, possibly involving the infraorbital foramen. No definite herniation of intraorbital contents. Recommend noncontrast CT face for further evaluation. 2.  No acute intracranial abnormality. These results will be called to the ordering clinician or representative by the Radiologist Assistant, and communication documented in the PACS or zVision Dashboard. Electronically Signed   By: Titus Dubin M.D.   On: 09/13/2017 15:15   Dg Knee Complete 4 Views Left  Result Date:  09/13/2017 CLINICAL DATA:  Status post fall today with onset of left knee pain. Initial encounter. EXAM: LEFT KNEE - COMPLETE 4+ VIEW COMPARISON:  None. FINDINGS: Total knee arthroplasty is in place. The device is located. No fracture is identified. No hardware complication is seen. No joint effusion. IMPRESSION: No acute abnormality in patient with a left total knee replacement. Electronically Signed   By: Inge Rise M.D.   On: 09/13/2017 13:46   Dg Knee Complete 4 Views Right  Result Date: 09/13/2017 CLINICAL DATA:  Status post fall with the acute onset of pain. EXAM: RIGHT KNEE - COMPLETE 4+ VIEW COMPARISON:  No recent studies in [facility] FINDINGS: The bones are subjectively adequately mineralized. There is mild narrowing of the lateral joint compartment. There is some eburnation of the articular surface of the lateral tibial plateau. There is irregularity of the articular surface of the medial femoral condyles. The there is beaking of the tibial spines. Spurs arise from the articular margins of the patella. There is no acute fracture or dislocation. IMPRESSION: There is moderate osteoarthritic change involving all 3 joint compartments. There is no acute fracture or dislocation. Electronically Signed   By: David  Martinique M.D.   On: 09/13/2017 13:47    Dg Wrist Complete Right  Addendum Date: 09/20/2017   ADDENDUM REPORT: 09/20/2017 12:05 ADDENDUM: Study compared to prior study September 13, 2017 Electronically Signed   By: Lowella Grip III M.D.   On: 09/20/2017 12:05   Result Date: 09/20/2017 CLINICAL DATA:  Fall 10 days prior EXAM: RIGHT WRIST - COMPLETE 3+ VIEW COMPARISON:  None. FINDINGS: Frontal, oblique, lateral, and ulnar deviation scaphoid images were obtained. No fracture or dislocation. There is narrowing of the radiocarpal joint. There is also mild osteoarthritic change in the first carpal -metacarpal joint. No erosive change. IMPRESSION: Areas of osteoarthritic change.  No evident  fracture or dislocation. Electronically Signed: By: Lowella Grip III M.D. On: 09/20/2017 12:02   Dg Hand Complete Right  Result Date: 09/20/2017 CLINICAL DATA:  Pain following fall 10 days prior EXAM: RIGHT HAND - COMPLETE 3+ VIEW COMPARISON:  Right wrist September 13, 2017 FINDINGS: Frontal, oblique, and lateral views obtained. No fracture or dislocation. There is osteoarthritic change in the radiocarpal and first carpal -metacarpal joints. There is also narrowing of all PIP and DIP joints. No erosive change. IMPRESSION: Multilevel osteoarthritic  change. No fracture or dislocation evident. Electronically Signed   By: Lowella Grip III M.D.   On: 09/20/2017 12:03    Assessment and Plan:   Concussion without loss of consciousness, subsequent encounter  Right hand pain - Plan: DG Wrist Complete Right, DG Hand Complete Right  Closed fracture of right orbital floor, initial encounter (HCC)  Acute pain of right knee   Concussion is improving, but still symptomatic somewhat. Mildly unsteady. Mildly abnormal Romberg. Overall headaches and fogginess and fatigability are improved.  Orbital floor fracture is decreased pain.  Primarily she complains of pain in her right hand and wrist region, and this is been placed in the rock up wrist splint. Films are again negative. No fractures.  Ongoing right knee pain. Advanced OA. She is contemplating total knee arthroplasty in the distant future.  Future Appointments Date Time Provider [place]  10/04/2017 2:15 PM Owens Loffler, MD [id number]  10/12/2017 3:00 PM Cameron Sprang, MD [facility] None   Orders Placed This Encounter  Procedures  . DG Wrist Complete Right  . DG Hand Complete Right    Signed,  Samual Beals T. Betzayda Braxton, MD   Allergies as of 09/20/2017      Reactions   Diazepam Other (See Comments)   REACTION: pt states she gets very angry and abusive verbally on this medication      Medication List        Accurate as of 09/20/17  1:32 PM. Always use your most recent med list.          albuterol 108 (90 Base) MCG/ACT inhaler Commonly known as:  PROVENTIL HFA;VENTOLIN HFA Inhale 2 puffs into the lungs every 4 (four) hours as needed for wheezing or shortness of breath.   amitriptyline 50 MG tablet Commonly known as:  ELAVIL TAKE TWO TABLETS BY MOUTH AT NIGHT   aspirin 81 MG tablet Take 81 mg by mouth daily.   atorvastatin 40 MG tablet Commonly known as:  LIPITOR TAKE ONE TABLET BY MOUTH ONCE DAILY   BENADRYL 25 mg capsule Generic drug:  diphenhydrAMINE Take 25 mg by mouth 2 (two) times daily.   buPROPion 150 MG 24 hr tablet Commonly known as:  WELLBUTRIN XL TAKE ONE TABLET BY MOUTH ONCE DAILY   clonazePAM 0.5 MG tablet Commonly known as:  KLONOPIN TAKE ONE TABLET BY MOUTH TWICE DAILY AS NEEDED   cyclobenzaprine 10 MG tablet Commonly known as:  FLEXERIL TAKE ONE TABLET BY MOUTH THREE TIMES DAILY AS NEEDED FOR MUSCLE SPASM   FLUoxetine 40 MG capsule Commonly known as:  PROZAC TAKE TWO CAPSULES BY MOUTH ONCE DAILY   Fluticasone-Salmeterol 250-50 MCG/DOSE Aepb Commonly known as:  ADVAIR DISKUS Inhale 1 puff into the lungs 2 (two) times daily.   magnesium oxide 400 MG tablet Commonly known as:  MAG-OX Take 400 mg by mouth daily.   METAMUCIL PO Take 1 capsule by mouth daily.   metoprolol tartrate 25 MG tablet Commonly known as:  LOPRESSOR Take 1 tablet (25 mg total) by mouth 2 (two) times daily.   multivitamin tablet Take 1 tablet by mouth daily.   naproxen 125 MG/5ML suspension Commonly known as:  NAPROSYN Take by mouth 2 (two) times daily with a meal.   ranitidine 150 MG tablet Commonly known as:  ZANTAC Take 1 tablet (150 mg total) by mouth 2 (two) times daily.   vitamin C 1000 MG tablet Take 1,000 mg by mouth daily.   Vitamin D 1000 units capsule Take 1,000 Units  by mouth 2 (two) times daily.

## 2017-10-04 ENCOUNTER — Ambulatory Visit (INDEPENDENT_AMBULATORY_CARE_PROVIDER_SITE_OTHER)
Admission: RE | Admit: 2017-10-04 | Discharge: 2017-10-04 | Disposition: A | Discharge status: Home / Self Care | Payer: Medicare Other | Source: Ambulatory Visit | Attending: Family Medicine | Admitting: Family Medicine

## 2017-10-04 ENCOUNTER — Ambulatory Visit (INDEPENDENT_AMBULATORY_CARE_PROVIDER_SITE_OTHER): Payer: Medicare Other | Admitting: Family Medicine

## 2017-10-04 ENCOUNTER — Encounter: Payer: Self-pay | Admitting: Family Medicine

## 2017-10-04 VITALS — BP 124/70 | HR 78 | Temp 99.0°F | Ht 65.75 in | Wt 220.2 lb

## 2017-10-04 DIAGNOSIS — S62306A Unspecified fracture of fifth metacarpal bone, right hand, initial encounter for closed fracture: Secondary | ICD-10-CM

## 2017-10-04 DIAGNOSIS — S0231XD Fracture of orbital floor, right side, subsequent encounter for fracture with routine healing: Secondary | ICD-10-CM | POA: Diagnosis not present

## 2017-10-04 DIAGNOSIS — M79641 Pain in right hand: Secondary | ICD-10-CM

## 2017-10-04 DIAGNOSIS — S060X0D Concussion without loss of consciousness, subsequent encounter: Secondary | ICD-10-CM

## 2017-10-04 NOTE — Progress Notes (Addendum)
Dr. Frederico Hamman T. Deanne Bedgood, MD, [facility] [place], [address] Phone: [phone] Fax: [phone]  10/04/2017  Patient: Amanda [name], MRN: [id number], DOB: May 22, 1951, 66 y.o.  Primary Physician:  Owens Loffler, MD   Chief Complaint  Patient presents with  . Follow-up    Concussion   Subjective:   Amanda [name] is a 66 y.o. very pleasant female patient who presents with the following:  DOI 09/13/2017  F/u concussion and multiple other pains.  Some facial pain.  HA is improved, and blurred vision is improving. Worse with reading.  She states that she is feeling better, but she remains quite symptomatic. She complains of headache, though improved. She also has some problems with her balance, some occasional blurred vision, fatigue, sensitivity to noise, as well as some tingling and numbness. She also has mental fogginess, and additionally she has sensation of feeling slow down as well as worsened concentration and some impaired memory.  She also feels more nervous compared to baseline.  She currently is taking Elavil 100 milligrams at night. She also is taking Wellbutrin XL, 150 milligrams a day. She also is taking Klonopin 0.5 milligrams. Chest is on Prozac 40 milligrams.  Wrist is still bothering her a lot, wrist. Fingers, more on the 5th on the R and will hurt all the time. She has been wearing a cock-up wrist splint since the time of injury.  Past Medical History, Surgical History, Social History, Family History, Problem List, Medications, and Allergies have been reviewed and updated if relevant.  Patient Active Problem List   Diagnosis Date Noted  . 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 sting   . COPD (chronic obstructive pulmonary disease) ([place]) 10/30/2016  . Gastroesophageal reflux disease 09/17/2016  . Degenerative arthritis of left knee 12/24/2015  . Memory loss 10/29/2015  . Primary osteoarthritis of right knee  06/16/2015  . Smoker 05/10/2015  . Solitary pulmonary nodule 03/28/2013  . Hypertension   . UNSPECIFIED VITAMIN D DEFICIENCY 05/29/2009  . Pure hypercholesterolemia 12/07/2007  . Major depressive disorder, recurrent episode, in partial remission ([place]) 12/07/2007  . Diabetes mellitus type 2, controlled ([place]) 08/10/2007    Past Medical History:  Diagnosis Date  . 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 sting    bee stings  . Anxiety   . COPD (chronic obstructive pulmonary disease) ([place]) 10/30/2016  . Degenerative arthritis of knee, bilateral 06/16/2015  . Depression   . Diabetes ([place])    diet managed-type 11  . GERD (gastroesophageal reflux disease)   . Hiatal hernia with gastroesophageal reflux 1997  . History of tachycardia   . Hyperlipidemia   . Hypertension    hx of tachycardia   . 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 Osteoporosis   . Thyroid disease    hyperthyroidism as a teenager - received some injections age 32 that corrected this   . Tobacco abuse     Past Surgical History:  Procedure Laterality Date  . ABDOMINAL HYSTERECTOMY  1993  . BACK SURGERY    . BREAST BIOPSY Right 2007  . CHOLECYSTECTOMY    . COLONOSCOPY     30 years ago was normal per pt.   . ESOPHAGEAL DILATION     x 3  . fractured leg Right   . OVARIAN CYST REMOVAL  1972  . SALIVARY GLAND SURGERY    . TONSILLECTOMY     4 yoa  . TOTAL KNEE ARTHROPLASTY  Left 12/24/2015   Procedure: LEFT TOTAL KNEE ARTHROPLASTY;  Surgeon: [name], MD;  Location: [facility];  Service: Orthopedics;  Laterality: Left;  . WRIST FRACTURE SURGERY  11/2008    Social History   Social History  . Marital status: Married    Spouse name: N/A  . Number of children: 2  . Years of education: N/A   Occupational History  . [employer]   Social History Main Topics  . Smoking status: Current Every Day Smoker    Packs/day: 0.50    Years: 45.00    Types:  Cigarettes    Last attempt to quit: 12/24/2015  . Smokeless tobacco: Never Used  . Alcohol use 0.0 oz/week     Comment: glass of wine twice a month   . Drug use: No  . Sexual activity: Not on file   Other Topics Concern  . Not on file   Social History Narrative  . No narrative on file    Family History  Problem Relation Age of Onset  . Hyperlipidemia Mother   . Arthritis Mother   . Diabetes Mother   . Heart disease Mother   . Colon polyps Mother   . Cancer Paternal Grandmother        breast  . Hypercholesterolemia Sister   . Colon polyps Sister   . Breast cancer Sister   . Colon cancer Neg Hx   . Esophageal cancer Neg Hx   . Rectal cancer Neg Hx   . Stomach cancer Neg Hx   . Pancreatic cancer Neg Hx     Allergies  Allergen Reactions  . Diazepam Other (See Comments)    REACTION: pt states she gets very angry and abusive verbally on this medication     Medication list reviewed and updated in full in [facility].   GEN: No acute illnesses, no fevers, chills. GI: No n/v/d, eating normally Pulm: No SOB Interactive and getting along well at home.  Otherwise, ROS is as per the HPI.  Objective:   BP 124/70   Pulse 78   Temp 99 F (37.2 C) (Oral)   Ht 5' 5.75" (1.67 m)   Wt 220 lb 4 oz (99.9 kg)   BMI 35.82 kg/m   GEN: WDWN, NAD, Non-toxic, A & O x 3 HEENT: Atraumatic, Normocephalic. Neck supple. No masses, No LAD. Ears and Nose: No external deformity. CV: RRR, No M/G/R. No JVD. No thrill. No extra heart sounds. PULM: CTA B, no wheezes, crackles, rhonchi. No retractions. No resp. distress. No accessory muscle use. EXTR: No c/c/e NEURO Normal gait.  PSYCH: Normally interactive. Conversant. Not depressed or anxious appearing.  Calm demeanor.   Neuro: CN 2-12 grossly intact. PERRLA. EOMI. Sensation intact throughout. Str 5/5 all extremities. DTR 2+. No clonus. A and o x 4. Romberg neg. Finger nose neg. Heel -shin unable to do.  R hand is NT along all  fingers, mild TTP in virtually all MCP joints without synovitis. Worst pain along 5th MC shaft. NT along ulna and radius.   Laboratory and Imaging Data: Dg Wrist Complete Right  Addendum Date: 09/20/2017   ADDENDUM REPORT: 09/20/2017 12:05 ADDENDUM: Study compared to prior study September 13, 2017 Electronically Signed   By: Lowella Grip III M.D.   On: 09/20/2017 12:05   Result Date: 09/20/2017 CLINICAL DATA:  Fall 10 days prior EXAM: RIGHT WRIST - COMPLETE 3+ VIEW COMPARISON:  None. FINDINGS: Frontal, oblique, lateral, and ulnar deviation scaphoid  images were obtained. No fracture or dislocation. There is narrowing of the radiocarpal joint. There is also mild osteoarthritic change in the first carpal -metacarpal joint. No erosive change. IMPRESSION: Areas of osteoarthritic change.  No evident fracture or dislocation. Electronically Signed: By: Lowella Grip III M.D. On: 09/20/2017 12:02   Dg Wrist Complete Right  Result Date: 09/13/2017 CLINICAL DATA:  66 year old female status post fall with acute right wrist pain. EXAM: RIGHT WRIST - COMPLETE 3+ VIEW COMPARISON:  None. FINDINGS: Soft tissue swelling about the wrist. Distal radius and ulna appear intact. Carpal bone alignment and joint spaces within normal limits. Proximal metacarpals appear intact. Bone mineralization is within normal limits for age. IMPRESSION: Soft tissue swelling but no acute fracture or dislocation identified about the right wrist. Electronically Signed   By: Genevie Ann M.D.   On: 09/13/2017 13:47   Ct Head Wo Contrast  Result Date: 09/13/2017 CLINICAL DATA:  Headache and blurred vision after fall. EXAM: CT HEAD WITHOUT CONTRAST TECHNIQUE: Contiguous axial images were obtained from the base of the skull through the vertex without intravenous contrast. COMPARISON:  MRI brain dated February 01, 2017. FINDINGS: Brain: No evidence of acute infarction, hemorrhage, hydrocephalus, extra-axial collection or mass lesion/mass  effect. Vascular: Atherosclerotic vascular calcification of the carotid siphons. No hyperdense vessel. Skull: No skull fracture or focal lesion. Sinuses/Orbits: There is a depressed fracture of the right inferior orbital wall, possibly involving the infraorbital foramen. Tiny focus of right intraorbital air. No definite herniation of intraorbital contents. The left orbit is unremarkable. Small amount of fluid in the left mastoid air cells. The paranasal sinuses and right mastoid air cells are clear. Other: Small amount of subcutaneous gas in the right temporal fossa. IMPRESSION: 1. Depressed fracture of the right inferior orbital wall, possibly involving the infraorbital foramen. No definite herniation of intraorbital contents. Recommend noncontrast CT face for further evaluation. 2.  No acute intracranial abnormality. These results will be called to the ordering clinician or representative by the Radiologist Assistant, and communication documented in the PACS or zVision Dashboard. Electronically Signed   By: Titus Dubin M.D.   On: 09/13/2017 15:15   Dg Knee Complete 4 Views Left  Result Date: 09/13/2017 CLINICAL DATA:  Status post fall today with onset of left knee pain. Initial encounter. EXAM: LEFT KNEE - COMPLETE 4+ VIEW COMPARISON:  None. FINDINGS: Total knee arthroplasty is in place. The device is located. No fracture is identified. No hardware complication is seen. No joint effusion. IMPRESSION: No acute abnormality in patient with a left total knee replacement. Electronically Signed   By: Inge Rise M.D.   On: 09/13/2017 13:46   Dg Knee Complete 4 Views Right  Result Date: 09/13/2017 CLINICAL DATA:  Status post fall with the acute onset of pain. EXAM: RIGHT KNEE - COMPLETE 4+ VIEW COMPARISON:  No recent studies in [facility] FINDINGS: The bones are subjectively adequately mineralized. There is mild narrowing of the lateral joint compartment. There is some eburnation of the articular surface of  the lateral tibial plateau. There is irregularity of the articular surface of the medial femoral condyles. The there is beaking of the tibial spines. Spurs arise from the articular margins of the patella. There is no acute fracture or dislocation. IMPRESSION: There is moderate osteoarthritic change involving all 3 joint compartments. There is no acute fracture or dislocation. Electronically Signed   By: David  Martinique M.D.   On: 09/13/2017 13:47   Dg Hand Complete Right  Result Date: 10/05/2017  CLINICAL DATA:  Right hand pain following trauma. EXAM: RIGHT HAND - COMPLETE 3+ VIEW COMPARISON:  Right wrist series of September 23, 2017 FINDINGS: The bones are subjectively mildly osteopenic. There is cortical irregularity of the base of the fifth metacarpal suspicious for a mildly angulated fracture. The metacarpals are intact elsewhere. The phalanges are intact. There is degenerative narrowing of the interphalangeal joints greatest distally. The first [facility] joint space is mildly narrowed. The intercarpal joint spaces are reasonably well-maintained. The carpal bones and distal radius and ulna appear intact. IMPRESSION: Possible mildly displaced fracture of the base of the fifth metacarpal. Correlation with any symptoms in this region is needed. Otherwise mild degenerative interphalangeal and first CMC joint changes similar to those seen previously. Electronically Signed   By: David  Martinique M.D.   On: 10/05/2017 07:49   Dg Hand Complete Right  Result Date: 09/20/2017 CLINICAL DATA:  Pain following fall 10 days prior EXAM: RIGHT HAND - COMPLETE 3+ VIEW COMPARISON:  Right wrist September 13, 2017 FINDINGS: Frontal, oblique, and lateral views obtained. No fracture or dislocation. There is osteoarthritic change in the radiocarpal and first carpal -metacarpal joints. There is also narrowing of all PIP and DIP joints. No erosive change. IMPRESSION: Multilevel osteoarthritic change. No fracture or dislocation evident.  Electronically Signed   By: Lowella Grip III M.D.   On: 09/20/2017 12:03     Assessment and Plan:   Concussion without loss of consciousness, subsequent encounter  Right hand pain - Plan: DG Hand Complete Right  Closed fracture of right orbital floor with routine healing, subsequent encounter  Closed fracture of fifth metacarpal bone of right hand, unspecified fracture morphology, initial encounter  She is progressing from concussion if slowly.  Definite progress each time I've seen her.  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  Most symptoms including headaches are improving.  She is already taking 100 mg of Elavil at nighttime.  Third set of films, all initial films negative, there is a base of the fifth metacarpal fracture.  She has been immobilized in a cockup wrist splint throughout this time.  I need to speak with her about this directly and, with a definitive plan of care. I made her an appointment to see Dr. Marlou Sa on 10/06/2017 at 1:45 PM.  Follow-up: Return in about 4 weeks (around 11/01/2017).  Future Appointments Date Time Provider [place]  10/12/2017 3:00 PM Cameron Sprang, MD [facility] None  11/03/2017 2:00 PM Jinelle Butchko, Frederico Hamman, MD [facility]   Orders Placed This Encounter  Procedures  . DG Hand Complete Right    Patient Instructions  HEAD INJURY / CONCUSSSION:   MOST PEOPLE RECOVER FINE AND COMPLETELY FROM A CONCUSSION, BUT THE MOST IMPORTANT THING IS VERY EARLY COMPLETE REST SO THAT THE BRAN CAN RECOVER.  COMPLETE PHYSICAL AND MENTAL REST IS NEEDED.  THAT MEANS: NO SCHOOL OR WORK UNTIL YOU ARE BETTER NO PHYSICAL EXERTION AT ALL UNTIL YOU HAVE NO SYMPTOMS NO MENTAL EXERTION, MEANING NO WORK, NO HOMEWORK, NO TEST TAKING.  NO DRIVING UNTIL YOU ARE ASYMPTOMATIC.  NO VIDEO GAMES, NO USING THE COMPUTER, NO TEXTING, NO USING SMARTPHONES, NO USE OF AN IPAD OR  TABLET. DO NOT GO TO A MOVIE THEATRE OR WATCH SPORTS ON TV. HDTV TENDS TO MAKE PEOPLE FEEL WORSE.   IN OTHER WORDS, DO NOT DO ANYTHING. SIT AND CALMLY REST UNTIL YOU FEEL BETTER. SLEEP IS OK.  YOU CAN HANG OUT AND TALK TO A FRIEND.  IT IS DIFFICULT TO KNOW HOW QUICKLY YOU WILL RECOVER. SOME PEOPLE FEEL BETTER IN A FEW DAYS, WHILE OTHER PEOPLE HAVE SYMPTOMS THAT CAN LAST FOR WEEKS TO MONTHS.  EARLY REST IS BY FAR THE MOST IMPORTANT THING.  If any of the following occur notify your physician or go to the Hospital Emergency Department - if markedly worsening:  . Increased drowsiness, stupor or loss of consciousness . Restlessness or convulsions (fits) . Paralysis in arms or legs . Temperature above 100 F . Vomiting . Severe headache . Blood or clear fluid dripping from the nose or ears . Stiffness of the neck . Dizziness or blurred vision . Pulsating pain in the eye . Unequal pupils of eye . Personality changes . Any other unusual symptoms  PRECAUTIONS . Keep head elevated at all times for the first 24 hours (Elevate mattress if pillow is ineffective) . Do not take tranquilizers, sedatives, narcotics or alcohol . Avoid aspirin. Use only acetaminophen (e.g. Tylenol) or ibuprofen (e.g. Advil) for relief of pain. Follow directions on the bottle for dosage. . Use ice packs for comfort  MEDICATIONS Use medications only as directed by your physician  Concussion Direct trauma to the head often causes a condition known as a concussion. This injury will interfere with brain function and may cause you to lose consciousness. The consequences of a concussion are usually temporary, but repetitive concussions can be very dangerous. If you have multiple concussions, you will have a greater risk of long-term effects, such as slurred speech, slow movements, impaired thinking, or tremors. The severity of a concussion is based on the length and severity of the interference with brain  activity.  SYMPTOMS  Symptoms of a concussion vary depending on the severity of the injury. Very mild concussions may even occur without any noticeable symptoms. Swelling in the area of the injury is not related to the seriousness of the injury.   CAUSES  A concussion is the result of trauma to the head. When the head is subjected to such an injury, the brain strikes against the inner wall of the skull. This impact is what causes the damage to the brain. The force of injury is related to severity of injury. The most severe concussions are associated with incidents that involve large impact forces such as motor vehicle accidents. Wearing a helmet will reduce the severity of trauma to the head, but concussions may still occur if you are wearing a helmet.  RISK INCREASES WITH:  Contact sports (football, hockey, rugby, or lacrosse).  Fighting sports (martial arts or boxing).  Riding bicycles, motorcycles, or horses (when you ride without a helmet).   PREVENTION  Wear proper protective headgear and ensure correct fit.  Wear seat belts when driving and riding in a car.  Do not drink or use mind-altering drugs and drive.   PROGNOSIS  Concussions are typically curable if they are recognized and treated early. If a severe concussion or multiple concussions go untreated, then the complications may be life-threatening or cause permanent disability and brain damage.  RELATED COMPLICATIONS   Permanent brain damage (slurred speech, slow movement, impaired thinking, or tremors).  Bleeding under the skull (subdural hemorrhage or hematoma, epidural hematoma).  Bleeding into the brain.  Prolonged healing time if usual activities are resumed too soon.  Infection if skin over the concussion site is broken.  Increased risk of future concussions (less trauma is required for a second concussion than  the first).     Signed,  Maud Deed. Taiya Nutting, MD   Allergies as of 10/04/2017       Reactions   Diazepam Other (See Comments)   REACTION: pt states she gets very angry and abusive verbally on this medication      Medication List       Accurate as of 10/04/17 11:59 PM. Always use your most recent med list.          albuterol 108 (90 Base) MCG/ACT inhaler Commonly known as:  PROVENTIL HFA;VENTOLIN HFA Inhale 2 puffs into the lungs every 4 (four) hours as needed for wheezing or shortness of breath.   amitriptyline 50 MG tablet Commonly known as:  ELAVIL TAKE TWO TABLETS BY MOUTH AT NIGHT   aspirin 81 MG tablet Take 81 mg by mouth daily.   atorvastatin 40 MG tablet Commonly known as:  LIPITOR TAKE ONE TABLET BY MOUTH ONCE DAILY   BENADRYL 25 mg capsule Generic drug:  diphenhydrAMINE Take 25 mg by mouth 2 (two) times daily.   buPROPion 150 MG 24 hr tablet Commonly known as:  WELLBUTRIN XL TAKE ONE TABLET BY MOUTH ONCE DAILY   clonazePAM 0.5 MG tablet Commonly known as:  KLONOPIN TAKE ONE TABLET BY MOUTH TWICE DAILY AS NEEDED   cyclobenzaprine 10 MG tablet Commonly known as:  FLEXERIL TAKE ONE TABLET BY MOUTH THREE TIMES DAILY AS NEEDED FOR MUSCLE SPASM   FLUoxetine 40 MG capsule Commonly known as:  PROZAC TAKE TWO CAPSULES BY MOUTH ONCE DAILY   Fluticasone-Salmeterol 250-50 MCG/DOSE Aepb Commonly known as:  ADVAIR DISKUS Inhale 1 puff into the lungs 2 (two) times daily.   magnesium oxide 400 MG tablet Commonly known as:  MAG-OX Take 400 mg by mouth daily.   METAMUCIL PO Take 1 capsule by mouth daily.   metoprolol tartrate 25 MG tablet Commonly known as:  LOPRESSOR Take 1 tablet (25 mg total) by mouth 2 (two) times daily.   multivitamin tablet Take 1 tablet by mouth daily.   naproxen 125 MG/5ML suspension Commonly known as:  NAPROSYN Take by mouth 2 (two) times daily with a meal.   ranitidine 150 MG tablet Commonly known as:  ZANTAC Take 1 tablet (150 mg total) by mouth 2 (two) times daily.   vitamin C 1000 MG tablet Take 1,000 mg  by mouth daily.   Vitamin D 1000 units capsule Take 1,000 Units by mouth 2 (two) times daily.

## 2017-10-04 NOTE — Patient Instructions (Signed)

## 2017-10-05 NOTE — Addendum Note (Signed)
Addended by: Owens Loffler on: 10/05/2017 09:20 AM   Modules accepted: Orders

## 2017-10-06 ENCOUNTER — Ambulatory Visit (INDEPENDENT_AMBULATORY_CARE_PROVIDER_SITE_OTHER): Payer: Medicare Other | Admitting: Orthopedic Surgery

## 2017-10-06 ENCOUNTER — Ambulatory Visit (INDEPENDENT_AMBULATORY_CARE_PROVIDER_SITE_OTHER): Payer: Medicare Other

## 2017-10-06 ENCOUNTER — Encounter (INDEPENDENT_AMBULATORY_CARE_PROVIDER_SITE_OTHER): Payer: Self-pay | Admitting: Orthopedic Surgery

## 2017-10-06 DIAGNOSIS — M1711 Unilateral primary osteoarthritis, right knee: Secondary | ICD-10-CM

## 2017-10-06 DIAGNOSIS — M25521 Pain in right elbow: Secondary | ICD-10-CM

## 2017-10-06 NOTE — Progress Notes (Signed)
Office Visit Note   Patient: Amanda [name]           Date of Birth: Jul 18, 1951           MRN: [id number] Visit Date: 10/06/2017 Requested by: Owens Loffler, MD [place], [place] [address] PCP: Owens Loffler, MD  Subjective: Chief Complaint  Patient presents with  . Right Hand - Pain  . Right Elbow - Pain    HPI: Amanda [name] is a patient who fell couple of weeks ago and injured her right hand.  Diagnosed with right fifth metacarpal fracture.  Radiographs from the outside are reviewed and she likely does have a nondisplaced base of the fifth metacarpal fracture.  She also is having elbow pain and would like to have radiographs made.  That has been done and they are normal.  She would also like to discuss right knee surgery.  She's done well with left knee replacement.  In the fall she sustained nondisplaced orbital floor fracture.              ROS: All systems reviewed are negative as they relate to the chief complaint within the history of present illness.  Patient denies  fevers or chills.   Assessment & Plan: Visit Diagnoses:  1. Pain in right elbow     Plan: Impression is 3-week-old metacarpal fracture on the right hand which has healed nicely.  She has some residual swelling and diminished power grip strength which is not surprising.  At this point in time I would favor no immobilization but careful activity with no lifting and only using the hand for ADLs that do not involve lifting.  In regards to the right elbow she has full range of motion and negative radiographs.  We will post her for right knee replacement in January.  Risks and benefits are discussed.  Including not limited to infection or vessel damage knee stiffness potential for anesthetic complications as well as the prolonged rehabilitation required.  She did well with her left total knee replacement.  We will likely try to match component size on the right.  I a Will see her back 2 weeks after that  knee replacement  Follow-Up Instructions: No Follow-up on file.   Orders:  Orders Placed This Encounter  Procedures  . XR Elbow 2 Views Right   No orders of the defined types were placed in this encounter.     Procedures: No procedures performed   Clinical Data: No additional findings.  Objective: Vital Signs: There were no vitals taken for this visit.  Physical Exam:   Constitutional: Patient appears well-developed HEENT:  Head: Normocephalic Eyes:EOM are normal Neck: Normal range of motion Cardiovascular: Normal rate Pulmonary/chest: Effort normal Neurologic: Patient is alert Skin: Skin is warm Psychiatric: Patient has normal mood and affect    Ortho Exam: Orthopedic exam demonstrates full active and passive range of motion of the wrist.  There is some swelling and tenderness at the base of fifth metacarpal but no rotational deformity is noted.  Finger flexion-extension is intact.  Elbow range of motion full with flexion extension pronation supination.  Right knee demonstrates about a 5-7 flexion contracture with mild effusion intact extensor mechanism and lateral greater than medial joint line tenderness.  Pedal pulses palpable.  Specialty Comments:  No specialty comments available.  Imaging: Xr Elbow 2 Views Right  Result Date: 10/06/2017 AP lateral right elbow reviewed.  Elbow is located.  No fracture present.  No effusion present.  Normal right elbow    PMFS History: Patient Active Problem List   Diagnosis Date Noted  . 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 sting   . COPD (chronic obstructive pulmonary disease) ([facility]) 10/30/2016  . Gastroesophageal reflux disease 09/17/2016  . Degenerative arthritis of left knee 12/24/2015  . Memory loss 10/29/2015  . Primary osteoarthritis of right knee 06/16/2015  . Smoker 05/10/2015  . Solitary pulmonary nodule 03/28/2013  . Hypertension   . UNSPECIFIED VITAMIN D DEFICIENCY 05/29/2009  . Pure hypercholesterolemia 12/07/2007  . Major  depressive disorder, recurrent episode, in partial remission ([facility]) 12/07/2007  . Diabetes mellitus type 2, controlled ([facility]) 08/10/2007   Past Medical History:  Diagnosis Date  . 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 sting    bee stings  . Anxiety   . COPD (chronic obstructive pulmonary disease) ([facility]) 10/30/2016  . Degenerative arthritis of knee, bilateral 06/16/2015  . Depression   . Diabetes ([facility])    diet managed-type 11  . GERD (gastroesophageal reflux disease)   . Hiatal hernia with gastroesophageal reflux 1997  . History of tachycardia   . Hyperlipidemia   . Hypertension    hx of tachycardia   . Neuromuscular disorder ([place])    nerve damage in back and shoulder  . Osteoporosis   . Thyroid disease    hyperthyroidism as a teenager - received some injections age 78 that corrected this   . Tobacco abuse     Family History  Problem Relation Age of Onset  . Hyperlipidemia Mother   . Arthritis Mother   . Diabetes Mother   . Heart disease Mother   . Colon polyps Mother   . Cancer Paternal Grandmother        breast  . Hypercholesterolemia Sister   . Colon polyps Sister   . Breast cancer Sister   . Colon cancer Neg Hx   . Esophageal cancer Neg Hx   . Rectal cancer Neg Hx   . Stomach cancer Neg Hx   . Pancreatic cancer Neg Hx     Past Surgical History:  Procedure Laterality Date  . ABDOMINAL HYSTERECTOMY  1993  . BACK SURGERY    . BREAST BIOPSY Right 2007  . CHOLECYSTECTOMY    . COLONOSCOPY     30 years ago was normal per pt.   . ESOPHAGEAL DILATION     x 3  . fractured leg Right   . OVARIAN CYST REMOVAL  1972  . SALIVARY GLAND SURGERY    . TONSILLECTOMY     4 yoa  . TOTAL KNEE ARTHROPLASTY Left 12/24/2015   Procedure: LEFT TOTAL KNEE ARTHROPLASTY;  Surgeon: [name], MD;  Location: [facility];  Service: Orthopedics;  Laterality: Left;  . WRIST FRACTURE SURGERY  11/2008   Social History   Occupational History  . [employer]    Social History Main Topics  . Smoking status: Current Every Day Smoker    Packs/day: 0.50    Years: 45.00    Types: Cigarettes    Last attempt to quit: 12/24/2015  . Smokeless tobacco: Never Used  . Alcohol use 0.0 oz/week     Comment: glass of wine twice a month   . Drug use: No  . Sexual activity: Not on file

## 2017-10-12 ENCOUNTER — Ambulatory Visit: Payer: Medicare Other | Admitting: Neurology

## 2017-10-13 ENCOUNTER — Other Ambulatory Visit: Payer: Self-pay | Admitting: Cardiovascular Disease

## 2017-10-25 ENCOUNTER — Other Ambulatory Visit: Payer: Self-pay | Admitting: Cardiovascular Disease

## 2017-11-03 ENCOUNTER — Encounter: Payer: Self-pay | Admitting: Family Medicine

## 2017-11-03 ENCOUNTER — Other Ambulatory Visit: Payer: Self-pay

## 2017-11-03 ENCOUNTER — Ambulatory Visit (INDEPENDENT_AMBULATORY_CARE_PROVIDER_SITE_OTHER): Payer: Medicare Other | Admitting: Family Medicine

## 2017-11-03 VITALS — BP 100/62 | HR 87 | Temp 97.9°F | Ht 65.75 in | Wt 219.0 lb

## 2017-11-03 DIAGNOSIS — S060X0D Concussion without loss of consciousness, subsequent encounter: Secondary | ICD-10-CM | POA: Diagnosis not present

## 2017-11-03 DIAGNOSIS — S0231XD Fracture of orbital floor, right side, subsequent encounter for fracture with routine healing: Secondary | ICD-10-CM

## 2017-11-03 DIAGNOSIS — S62346D Nondisplaced fracture of base of fifth metacarpal bone, right hand, subsequent encounter for fracture with routine healing: Secondary | ICD-10-CM | POA: Diagnosis not present

## 2017-11-03 NOTE — Progress Notes (Signed)
Dr. Frederico Hamman T. Elison Worrel, MD, [facility] [place], [address] Phone: [phone] Fax: [phone]  11/03/2017  Patient: Amanda [name], MRN: [id number], DOB: [birth year], 66 y.o.  Primary Physician:  Owens Loffler, MD   Chief Complaint  Patient presents with  . Follow-up    Fall/Concussion   Subjective:   Amanda [name] is a 66 y.o. very pleasant female patient who presents with the following:  DOI 09/13/2017  Fall / concussion.  She is really doing fairly dramatically better compared to the last time I saw her.  She has some occasional headache, but otherwise most of her consult concussion symptoms are entirely gone.  Feeling better than she was and having some headache. Hands and wrists are doing better.   Cannot lift weight with the R hand. 5th [facility] - all doing better.   Knees have healed up.   incr prozac  Past Medical History, Surgical History, Social History, Family History, Problem List, Medications, and Allergies have been reviewed and updated if relevant.  Patient Active Problem List   Diagnosis Date Noted  . 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 sting   . COPD (chronic obstructive pulmonary disease) ([facility]) 10/30/2016  . Gastroesophageal reflux disease 09/17/2016  . Degenerative arthritis of left knee 12/24/2015  . Memory loss 10/29/2015  . Primary osteoarthritis of right knee 06/16/2015  . Smoker 05/10/2015  . Solitary pulmonary nodule 03/28/2013  . Hypertension   . UNSPECIFIED VITAMIN D DEFICIENCY 05/29/2009  . Pure hypercholesterolemia 12/07/2007  . Major depressive disorder, recurrent episode, in partial remission ([facility]) 12/07/2007  . Diabetes mellitus type 2, controlled ([facility]) 08/10/2007    Past Medical History:  Diagnosis Date  . 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 sting    bee stings  . Anxiety   . COPD (chronic obstructive pulmonary disease) ([facility]) 10/30/2016  . Degenerative arthritis of knee, bilateral 06/16/2015  .  Depression   . Diabetes ([facility])    diet managed-type 11  . GERD (gastroesophageal reflux disease)   . Hiatal hernia with gastroesophageal reflux 1997  . History of tachycardia   . Hyperlipidemia   . Hypertension    hx of tachycardia   . Neuromuscular disorder ([facility])    nerve damage in back and shoulder  . Osteoporosis   . Thyroid disease    hyperthyroidism as a teenager - received some injections age 66 that corrected this   . Tobacco abuse     Past Surgical History:  Procedure Laterality Date  . ABDOMINAL HYSTERECTOMY  1993  . BACK SURGERY    . BREAST BIOPSY Right 2007  . CHOLECYSTECTOMY    . COLONOSCOPY     30 years ago was normal per pt.   . ESOPHAGEAL DILATION     x 3  . fractured leg Right   . OVARIAN CYST REMOVAL  1972  . SALIVARY GLAND SURGERY    . TONSILLECTOMY     4 yoa  . TOTAL KNEE ARTHROPLASTY Left 12/24/2015   Procedure: LEFT TOTAL KNEE ARTHROPLASTY;  Surgeon: [name], MD;  Location: [facility];  Service: Orthopedics;  Laterality: Left;  . WRIST FRACTURE SURGERY  11/2008    Social History   Socioeconomic History  . Marital status: Married    Spouse name: Not on file  . Number of children: 2  . Years of education: Not on file  . Highest education level: Not on file  Social Needs  . Financial resource strain: Not on file  .  Food insecurity - worry: Not on file  . Food insecurity - inability: Not on file  . Transportation needs - medical: Not on file  . Transportation needs - non-medical: Not on file  Occupational History  . Occupation: Chief Executive Officer: [employer]  Tobacco Use  . Smoking status: Current Every Day Smoker    Packs/day: 0.50    Years: 45.00    Pack years: 22.50    Types: Cigarettes    Last attempt to quit: 12/24/2015    Years since quitting: 1.8  . Smokeless tobacco: Never Used  Substance and Sexual Activity  . Alcohol use: Yes    Alcohol/week: 0.0 oz    Comment: glass of wine twice  a month   . Drug use: No  . Sexual activity: Not on file  Other Topics Concern  . Not on file  Social History Narrative  . Not on file    Family History  Problem Relation Age of Onset  . Hyperlipidemia Mother   . Arthritis Mother   . Diabetes Mother   . Heart disease Mother   . Colon polyps Mother   . Cancer Paternal Grandmother        breast  . Hypercholesterolemia Sister   . Colon polyps Sister   . Breast cancer Sister   . Colon cancer Neg Hx   . Esophageal cancer Neg Hx   . Rectal cancer Neg Hx   . Stomach cancer Neg Hx   . Pancreatic cancer Neg Hx     Allergies  Allergen Reactions  . Diazepam Other (See Comments)    REACTION: pt states she gets very angry and abusive verbally on this medication     Medication list reviewed and updated in full in [facility].   GEN: No acute illnesses, no fevers, chills. GI: No n/v/d, eating normally Pulm: No SOB Interactive and getting along well at home.  Otherwise, ROS is as per the HPI.  Objective:   BP 100/62   Pulse 87   Temp 97.9 F (36.6 C) (Oral)   Ht 5' 5.75" (1.67 m)   Wt 219 lb (99.3 kg)   BMI 35.62 kg/m   GEN: WDWN, NAD, Non-toxic, A & O x 3 HEENT: Atraumatic, Normocephalic. Neck supple. No masses, No LAD. Ears and Nose: No external deformity. CV: RRR, No M/G/R. No JVD. No thrill. No extra heart sounds. PULM: CTA B, no wheezes, crackles, rhonchi. No retractions. No resp. distress. No accessory muscle use. EXTR: No c/c/e NEURO Normal gait.  PSYCH: Normally interactive. Conversant. Not depressed or anxious appearing.  Calm demeanor.   Neuro: CN 2-12 grossly intact. PERRLA. EOMI. Sensation intact throughout. Str 5/5 all extremities.    Laboratory and Imaging Data:  Assessment and Plan:   Concussion without loss of consciousness, subsequent encounter  Nondisplaced fracture of base of fifth metacarpal bone, right hand, subsequent encounter for fracture with routine healing  Closed fracture of  right orbital floor with routine healing, subsequent encounter  Concussion is doing dramatically better.  Reassured her, and expect that she will continue to improve.  Fifth metacarpal fracture is doing much better, range of motion and strength in the hand is much better.  Orbital floor fracture is also healing quite well.  Overall with all day she is doing much better after serious fall  Follow-up: No Follow-up on file.  Future Appointments  Date Time Provider [place]  04/15/2018  4:00 PM Cameron Sprang, MD [facility]  None    Signed,  Devlin Mcveigh T. Adain Geurin, MD   Allergies as of 11/03/2017      Reactions   Diazepam Other (See Comments)   REACTION: pt states she gets very angry and abusive verbally on this medication      Medication List        Accurate as of 11/03/17  5:13 PM. Always use your most recent med list.          albuterol 108 (90 Base) MCG/ACT inhaler Commonly known as:  PROVENTIL HFA;VENTOLIN HFA Inhale 2 puffs into the lungs every 4 (four) hours as needed for wheezing or shortness of breath.   amitriptyline 50 MG tablet Commonly known as:  ELAVIL TAKE TWO TABLETS BY MOUTH AT NIGHT   aspirin 81 MG tablet Take 81 mg by mouth daily.   atorvastatin 40 MG tablet Commonly known as:  LIPITOR TAKE ONE TABLET BY MOUTH ONCE DAILY   BENADRYL 25 mg capsule Generic drug:  diphenhydrAMINE Take 25 mg by mouth 2 (two) times daily.   buPROPion 150 MG 24 hr tablet Commonly known as:  WELLBUTRIN XL TAKE ONE TABLET BY MOUTH ONCE DAILY   clonazePAM 0.5 MG tablet Commonly known as:  KLONOPIN TAKE ONE TABLET BY MOUTH TWICE DAILY AS NEEDED   cyclobenzaprine 10 MG tablet Commonly known as:  FLEXERIL TAKE ONE TABLET BY MOUTH THREE TIMES DAILY AS NEEDED FOR MUSCLE SPASM   FLUoxetine 40 MG capsule Commonly known as:  PROZAC TAKE TWO CAPSULES BY MOUTH ONCE DAILY   Fluticasone-Salmeterol 250-50 MCG/DOSE Aepb Commonly known as:  ADVAIR DISKUS Inhale 1 puff  into the lungs 2 (two) times daily.   magnesium oxide 400 MG tablet Commonly known as:  MAG-OX Take 400 mg by mouth daily.   METAMUCIL PO Take 1 capsule by mouth daily.   metoprolol tartrate 25 MG tablet Commonly known as:  LOPRESSOR Take 1 tablet (25 mg total) by mouth 2 (two) times daily.   multivitamin tablet Take 1 tablet by mouth daily.   naproxen 125 MG/5ML suspension Commonly known as:  NAPROSYN Take by mouth 2 (two) times daily with a meal.   ranitidine 150 MG tablet Commonly known as:  ZANTAC Take 1 tablet (150 mg total) by mouth 2 (two) times daily.   vitamin C 1000 MG tablet Take 1,000 mg by mouth daily.   Vitamin D 1000 units capsule Take 1,000 Units by mouth 2 (two) times daily.

## 2017-11-04 ENCOUNTER — Telehealth: Payer: Self-pay | Admitting: Cardiovascular Disease

## 2017-11-04 NOTE — Telephone Encounter (Signed)
°*  STAT* If patient is at the pharmacy, call can be transferred to refill team.   1. Which medications need to be refilled? (please list name of each medication and dose if known)  Metoprolol   2. Which pharmacy/location (including street and city if local pharmacy) is medication to be sent to? [facility] on [address]   3. Do they need a 30 day or 90 day supply?  90 day

## 2017-11-04 NOTE — Telephone Encounter (Signed)
Lmov for patient to call and schedule appointment Needed for refills.  Will need to try again at a later time

## 2017-11-04 NOTE — Telephone Encounter (Signed)
Pt is overdue for appointment. Pt needs to schedule for future 1 yr f/u appointment.

## 2017-11-09 ENCOUNTER — Other Ambulatory Visit: Payer: Self-pay | Admitting: Cardiovascular Disease

## 2017-11-09 NOTE — Telephone Encounter (Signed)
Patient has set up appt to see Dunn on 12/13 Patient would like refill to be resent by 4pm if possible   *STAT* If patient is at the pharmacy, call can be transferred to refill team.   1. Which medications need to be refilled? (please list name of each medication and dose if known)  Metoprolol   2. Which pharmacy/location (including street and city if local pharmacy) is medication to be sent to? [facility] [address]   3. Do they need a 30 day or 90 day supply?  90 day

## 2017-11-10 ENCOUNTER — Other Ambulatory Visit: Payer: Self-pay

## 2017-11-10 ENCOUNTER — Telehealth: Payer: Self-pay | Admitting: Cardiovascular Disease

## 2017-11-10 MED ORDER — METOPROLOL TARTRATE 25 MG PO TABS: 12.5000 mg | ORAL_TABLET | Freq: Two times a day (BID) | ORAL | 0 refills | Status: DC

## 2017-11-10 NOTE — Telephone Encounter (Signed)
Pt called this afternoon asking "what is the big deal" in regards to sending metoprolol refill. States she has always taken 25mg  BID. I explained that when she was last in the office August 2017, it was documented that metoprolol 12.5mg  BID was prescribed by Dr. Fletcher Anon; he made no medication changes at that visit. Unsure who changed dosage therefore, I sent in instructions for 12.5mg  BID. Advised her to take medication as prescribed and to further discuss at 12/13 OV. She should monitor blood pressures at home. She was unaware the prescription had been sent to [facility]. Advised to pick this up today. She is agreeable w/plan.

## 2017-11-10 NOTE — Telephone Encounter (Signed)
Lmov for patient to call back and schedule fu appt

## 2017-11-10 NOTE — Telephone Encounter (Signed)
Refill request for Metoprolol tar 25mg  one tablet twice daily. Last visit states 0.5tablets twice daily on med list. I don't see where it has been changed. Please advise on how to refill. Pt was last seen 07/2016 with directions to f/u in 76yr. Pt scheduled to see Thurmond Butts 11/18/2017. Ok to send 30day supply? Thanks.

## 2017-11-10 NOTE — Telephone Encounter (Signed)
Metoprolol 25mg  BID refill request from pharmacy. When pt last saw Dr. Fletcher Anon in 2017, metoprolol 12.5mg  BID was on medication list. There were no changes to medications. I do not see where another provider increased her dosage. I have sent in refill for 12.5mg  BID 0.5 tablet) until her December 13 appt w/Ryan Dunn, PA-C.

## 2017-11-10 NOTE — Telephone Encounter (Signed)
Requested Prescriptions   Signed Prescriptions Disp Refills  . metoprolol tartrate (LOPRESSOR) 25 MG tablet 90 tablet 0    Sig: Take 0.5 tablets (12.5 mg total) by mouth 2 (two) times daily.    Authorizing Provider: Kathlyn Sacramento A    Ordering User: Janan Ridge

## 2017-11-16 ENCOUNTER — Encounter: Payer: Self-pay | Admitting: Physician Assistant

## 2017-11-16 NOTE — Progress Notes (Deleted)
Cardiology Office Note Date:  11/16/2017  Patient ID:  Amanda [name], DOB Feb 05, 1951, MRN [id number] PCP:  Owens Loffler, MD  Cardiologist:  Dr. Fletcher Anon, MD  ***refresh   Chief Complaint: Follow up palpitations   History of Present Illness: Amanda [name] is a 66 y.o. female with history of diabetes, COPD 2/2 tobacco abuse, palpitations, HTN, HLD, hyperthyroidism, and GERD who presents for follow up of her palpitations.   Previous cardiac workup in 203 included a treadmill stress test that was normal, echo that showed normal LV systolic function, mild mitral regurgitation, and no evidence of pulmonary hypertension. A 48-hour Holter monitor shwoed intermittent sinus tachycardia with occasional PACs, without other significant arrhythmia. Her palpitations improved significantly after adding metoprolol. She was most recently seen by Dr. Fletcher Anon on 07/17/2016 and was doing reasonably well at that time, noting mild, intermittent palpitations on metoprolol. A murmur was noted on exam at that time with follow up echo on 08/05/2016 showing an EF of 60-65%, no RWMA, normal LV diastolic function, calcified aortic annulus with mild aortic stenosis and moderate aortic insufficiency. Patient called in early December asking for a refill of metoprolol 25 mg bid. On her medication list we had 12.5 mg bid noted. It is unclear where the dosage increase occurred.  ***   Past Medical History:  Diagnosis Date  . 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 sting    bee stings  . Anxiety   . Aortic stenosis    a. 2013: nl LV sys fxn, mild MR, no evidence of pulm htn; b. TTE 8/17: EF 60-65%, no RWMA, nl LV dia fxn,  mild AS, mod AI  . COPD (chronic obstructive pulmonary disease) ([facility]) 10/30/2016  . Degenerative arthritis of knee, bilateral 06/16/2015  . Depression   . Diabetes ([facility])    diet managed-type 11  . GERD (gastroesophageal reflux disease)   . Hiatal hernia with gastroesophageal reflux 1997  . Hyperlipidemia   . Hypertension     hx of tachycardia   . Neuromuscular disorder ([place])    nerve damage in back and shoulder  . Osteoporosis   . Palpitations    a. 03/2012: 48-hour Holter monitor showed intermittent sinus tachycardia with occasional PACs, without other significant arrhythmia  . Thyroid disease    hyperthyroidism as a teenager - received some injections age 53 that corrected this   . Tobacco abuse     Past Surgical History:  Procedure Laterality Date  . ABDOMINAL HYSTERECTOMY  1993  . BACK SURGERY    . BREAST BIOPSY Right 2007  . CHOLECYSTECTOMY    . COLONOSCOPY     30 years ago was normal per pt.   . ESOPHAGEAL DILATION     x 3  . fractured leg Right   . OVARIAN CYST REMOVAL  1972  . SALIVARY GLAND SURGERY    . TONSILLECTOMY     4 yoa  . TOTAL KNEE ARTHROPLASTY Left 12/24/2015   Procedure: LEFT TOTAL KNEE ARTHROPLASTY;  Surgeon: [name], MD;  Location: [facility];  Service: Orthopedics;  Laterality: Left;  . WRIST FRACTURE SURGERY  11/2008    No outpatient medications have been marked as taking for the 11/18/17 encounter (Appointment) with Rise Mu, PA-C.   Current Facility-Administered Medications for the 11/18/17 encounter (Appointment) with Rise Mu, PA-C  Medication  . 0.9 %  sodium chloride infusion    Allergies:   Diazepam   Social History:  The patient  reports that she has been smoking cigarettes.  She has a 22.50 pack-year smoking history. she has never used smokeless tobacco. She reports that she drinks alcohol. She reports that she does not use drugs.   Family History:  The patient's family history includes Arthritis in her mother; Breast cancer in her sister; Cancer in her paternal grandmother; Colon polyps in her mother and sister; Diabetes in her mother; Heart disease in her mother; Hypercholesterolemia in her sister; Hyperlipidemia in her mother.  ROS:   ROS   PHYSICAL EXAM: *** VS:  There were no vitals taken for this visit. BMI: There is no height or  weight on file to calculate BMI.  Physical Exam  EKG:  Was ordered and interpreted by me today. Shows ***  Recent Labs: 06/02/2017: ALT 19; BUN 10; Creatinine, Ser 0.77; Hemoglobin 12.7; Platelets 249.0; Potassium 4.4; Sodium 139; TSH 0.70  06/02/2017: Cholesterol 178; HDL 53.20; LDL Cholesterol 108; Total CHOL/HDL Ratio 3; Triglycerides 81.0; VLDL 16.2   CrCl cannot be calculated (Patient's most recent lab result is older than the maximum 21 days allowed.).   Wt Readings from Last 3 Encounters:  11/03/17 219 lb (99.3 kg)  10/04/17 220 lb 4 oz (99.9 kg)  09/20/17 221 lb 8 oz (100.5 kg)     Other studies reviewed: Additional studies/records reviewed today include: summarized above  ASSESSMENT AND PLAN:  1. Palpitations: 2. Aortic stenosis: 3. HTN: 4. Tobacco abuse:  Disposition: F/u with *** in   Current medicines are reviewed at length with the patient today.  The patient did not have any concerns regarding medicines.  Melvern Banker PA-C 11/16/2017 11:28 AM     [facility] [address] [place] [address] [place], [place] [address] [phone]

## 2017-11-18 ENCOUNTER — Ambulatory Visit: Payer: Medicare Other | Admitting: Physician Assistant

## 2017-11-22 ENCOUNTER — Telehealth (INDEPENDENT_AMBULATORY_CARE_PROVIDER_SITE_OTHER): Payer: Self-pay

## 2017-11-22 NOTE — Telephone Encounter (Signed)
Patient left voice mail for return call to schedule surgery.  I returned patient's call and left voice mail for return call.

## 2017-11-24 NOTE — Telephone Encounter (Signed)
It would help straighten the leg but the rehab will be difficult and you have to have 100% commitment to get through total knee replacement.  TKA will straighten the leg but the tendency is for her to draw back up so you have to fight the battle every day

## 2017-11-24 NOTE — Telephone Encounter (Signed)
Please advise. Thanks.  

## 2017-11-24 NOTE — Telephone Encounter (Signed)
Patient wants to know if TKA will help straighten her leg.  Please call her to advise.

## 2017-11-24 NOTE — Telephone Encounter (Signed)
Tried calling patient back to discuss her concerns. No answer. I LMVM for her to [facility] to advise what Dr Marlou Sa had stated.

## 2017-12-08 ENCOUNTER — Encounter: Payer: Self-pay | Admitting: Physician Assistant

## 2017-12-08 NOTE — Progress Notes (Deleted)
Cardiology Office Note Date:  12/08/2017  Patient ID:  Amanda [name], DOB 03/27/1951, MRN [id number] PCP:  Owens Loffler, MD  Cardiologist:  Dr. Fletcher Anon, MD  ***refresh   Chief Complaint: Follow up of palpitations/aortic stenosis  History of Present Illness: Amanda [name] is a 67 y.o. female with history of palpitations, mild aortic stenosis by echo in 07/2016, DM, HTN, HLD, GERD, anxiety/depression, and COPD with ongoing tobacco abuse who presents for routine follow up of her palpitations and mild aortic stenosis.   Previous cardiac workup in 2013 included a treadmill stress test that was normal, echo that showed normal LV systolic function, mild mitral regurgitation, and no evidence of pulmonary hypertension. A 48-hour Holter monitor showed intermittent sinus tachycardia with occasional PACs without significant arrhythmia. Her palpitations significantly improved with the addition of metoprolol. She was most recently seen by Dr. Fletcher Anon on 07/17/2016 for routine follow up and was doing well at that time. Murmur was noted on exam with follow up echo on 08/05/2016 that showed EF 60-65%, no RWMA, normal LV diastolic function parameters, calcified aortic annulus with mild aortic stenosis and moderate aortic regurgitation. She called on 11/10/17 for a refill of her metoprolol, which we had listed as 12.5 mg bid. Patient reported she had always taken metoprolol 25 mg bid. It was unclear where the dosage increase occurred.  ***   Past Medical History:  Diagnosis Date  . 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 sting    bee stings  . Anxiety   . Aortic stenosis    a. 2013: nl LV sys fxn, mild MR, no evidence of pulm htn; b. TTE 8/17: EF 60-65%, no RWMA, nl LV dia fxn,  mild AS, mod AI  . COPD (chronic obstructive pulmonary disease) ([place]) 10/30/2016  . Degenerative arthritis of knee, bilateral 06/16/2015  . Depression   . Diabetes ([place])    diet managed-type 11  . GERD (gastroesophageal reflux disease)   . Hiatal  hernia with gastroesophageal reflux 1997  . Hyperlipidemia   . Hypertension   . Neuromuscular disorder ([place])    nerve damage in back and shoulder  . Osteoporosis   . Palpitations    a. 03/2012: 48-hour Holter monitor showed intermittent sinus tachycardia with occasional PACs, without other significant arrhythmia  . Thyroid disease    hyperthyroidism as a teenager - received some injections age 27 that corrected this   . Tobacco abuse     Past Surgical History:  Procedure Laterality Date  . ABDOMINAL HYSTERECTOMY  1993  . BACK SURGERY    . BREAST BIOPSY Right 2007  . CHOLECYSTECTOMY    . COLONOSCOPY     30 years ago was normal per pt.   . ESOPHAGEAL DILATION     x 3  . fractured leg Right   . OVARIAN CYST REMOVAL  1972  . SALIVARY GLAND SURGERY    . TONSILLECTOMY     4 yoa  . TOTAL KNEE ARTHROPLASTY Left 12/24/2015   Procedure: LEFT TOTAL KNEE ARTHROPLASTY;  Surgeon: [name], MD;  Location: [facility];  Service: Orthopedics;  Laterality: Left;  . WRIST FRACTURE SURGERY  11/2008    No outpatient medications have been marked as taking for the 12/13/17 encounter (Appointment) with Rise Mu, PA-C.   Current Facility-Administered Medications for the 12/13/17 encounter (Appointment) with Rise Mu, PA-C  Medication  . 0.9 %  sodium chloride infusion    Allergies:   Diazepam   Social History:  The patient  reports  that she has been smoking cigarettes.  She has a 22.50 pack-year smoking history. she has never used smokeless tobacco. She reports that she drinks alcohol. She reports that she does not use drugs.   Family History:  The patient's family history includes Arthritis in her mother; Breast cancer in her sister; Cancer in her paternal grandmother; Colon polyps in her mother and sister; Diabetes in her mother; Heart disease in her mother; Hypercholesterolemia in her sister; Hyperlipidemia in her mother.  ROS:   ROS   PHYSICAL EXAM: *** VS:  There were no vitals  taken for this visit. BMI: There is no height or weight on file to calculate BMI.  Physical Exam   EKG:  Was ordered and interpreted by me today. Shows ***  Recent Labs: 06/02/2017: ALT 19; BUN 10; Creatinine, Ser 0.77; Hemoglobin 12.7; Platelets 249.0; Potassium 4.4; Sodium 139; TSH 0.70  06/02/2017: Cholesterol 178; HDL 53.20; LDL Cholesterol 108; Total CHOL/HDL Ratio 3; Triglycerides 81.0; VLDL 16.2   CrCl cannot be calculated (Patient's most recent lab result is older than the maximum 21 days allowed.).   Wt Readings from Last 3 Encounters:  11/03/17 219 lb (99.3 kg)  10/04/17 220 lb 4 oz (99.9 kg)  09/20/17 221 lb 8 oz (100.5 kg)     Other studies reviewed: Additional studies/records reviewed today include: summarized above  ASSESSMENT AND PLAN:  1. ***  Disposition: F/u with *** in   Current medicines are reviewed at length with the patient today.  The patient did not have any concerns regarding medicines.  Melvern Banker PA-C 12/08/2017 3:50 PM     [address] [place], [place] [address] [phone]

## 2017-12-12 ENCOUNTER — Other Ambulatory Visit: Payer: Self-pay | Admitting: Family Medicine

## 2017-12-13 ENCOUNTER — Ambulatory Visit: Payer: Medicare Other | Admitting: Physician Assistant

## 2017-12-13 NOTE — Telephone Encounter (Signed)
Last office visit 11/03/2017.  Last refilled 05/27/2017 for #30 with 3 refills.  Ok to refill?

## 2017-12-14 ENCOUNTER — Encounter: Payer: Self-pay | Admitting: Physician Assistant

## 2017-12-20 ENCOUNTER — Other Ambulatory Visit: Payer: Self-pay

## 2017-12-20 ENCOUNTER — Encounter (HOSPITAL_COMMUNITY)
Admission: RE | Admit: 2017-12-20 | Discharge: 2017-12-20 | Disposition: A | Discharge status: Home / Self Care | Payer: Medicare Other | Source: Ambulatory Visit | Attending: Orthopedic Surgery | Admitting: Orthopedic Surgery

## 2017-12-20 ENCOUNTER — Other Ambulatory Visit (INDEPENDENT_AMBULATORY_CARE_PROVIDER_SITE_OTHER): Payer: Self-pay | Admitting: Physician Assistant

## 2017-12-20 ENCOUNTER — Other Ambulatory Visit (INDEPENDENT_AMBULATORY_CARE_PROVIDER_SITE_OTHER): Payer: Self-pay | Admitting: Orthopedic Surgery

## 2017-12-20 ENCOUNTER — Encounter (HOSPITAL_COMMUNITY): Payer: Self-pay

## 2017-12-20 DIAGNOSIS — Z9889 Other specified postprocedural states: Secondary | ICD-10-CM | POA: Diagnosis not present

## 2017-12-20 DIAGNOSIS — Z01810 Encounter for preprocedural cardiovascular examination: Secondary | ICD-10-CM | POA: Diagnosis not present

## 2017-12-20 DIAGNOSIS — I350 Nonrheumatic aortic (valve) stenosis: Secondary | ICD-10-CM | POA: Insufficient documentation

## 2017-12-20 DIAGNOSIS — E039 Hypothyroidism, unspecified: Secondary | ICD-10-CM | POA: Insufficient documentation

## 2017-12-20 DIAGNOSIS — I10 Essential (primary) hypertension: Secondary | ICD-10-CM | POA: Insufficient documentation

## 2017-12-20 DIAGNOSIS — Z79899 Other long term (current) drug therapy: Secondary | ICD-10-CM | POA: Insufficient documentation

## 2017-12-20 DIAGNOSIS — E119 Type 2 diabetes mellitus without complications: Secondary | ICD-10-CM | POA: Insufficient documentation

## 2017-12-20 DIAGNOSIS — Z01812 Encounter for preprocedural laboratory examination: Secondary | ICD-10-CM | POA: Diagnosis not present

## 2017-12-20 DIAGNOSIS — K219 Gastro-esophageal reflux disease without esophagitis: Secondary | ICD-10-CM | POA: Diagnosis not present

## 2017-12-20 DIAGNOSIS — E785 Hyperlipidemia, unspecified: Secondary | ICD-10-CM | POA: Insufficient documentation

## 2017-12-20 DIAGNOSIS — I491 Atrial premature depolarization: Secondary | ICD-10-CM | POA: Insufficient documentation

## 2017-12-20 DIAGNOSIS — F17200 Nicotine dependence, unspecified, uncomplicated: Secondary | ICD-10-CM | POA: Diagnosis not present

## 2017-12-20 DIAGNOSIS — Z7982 Long term (current) use of aspirin: Secondary | ICD-10-CM | POA: Diagnosis not present

## 2017-12-20 DIAGNOSIS — M1711 Unilateral primary osteoarthritis, right knee: Secondary | ICD-10-CM

## 2017-12-20 LAB — URINALYSIS, ROUTINE W REFLEX MICROSCOPIC
Bilirubin Urine: NEGATIVE
Glucose, UA: NEGATIVE mg/dL
Hgb urine dipstick: NEGATIVE
Ketones, ur: NEGATIVE mg/dL
Nitrite: NEGATIVE
Protein, ur: NEGATIVE mg/dL
Specific Gravity, Urine: 1.003 — ABNORMAL LOW (ref 1.005–1.030)
pH: 7 (ref 5.0–8.0)

## 2017-12-20 LAB — CBC
HCT: 40.1 % (ref 36.0–46.0)
Hemoglobin: 12.9 g/dL (ref 12.0–15.0)
MCH: 33.5 pg (ref 26.0–34.0)
MCHC: 32.2 g/dL (ref 30.0–36.0)
MCV: 104.2 fL — ABNORMAL HIGH (ref 78.0–100.0)
Platelets: 226 10*3/uL (ref 150–400)
RBC: 3.85 MIL/uL — ABNORMAL LOW (ref 3.87–5.11)
RDW: 11.5 % (ref 11.5–15.5)
WBC: 9.5 10*3/uL (ref 4.0–10.5)

## 2017-12-20 LAB — BASIC METABOLIC PANEL
Anion gap: 9 (ref 5–15)
BUN: 7 mg/dL (ref 6–20)
CO2: 24 mmol/L (ref 22–32)
Calcium: 9.3 mg/dL (ref 8.9–10.3)
Chloride: 104 mmol/L (ref 101–111)
Creatinine, Ser: 0.89 mg/dL (ref 0.44–1.00)
GFR calc Af Amer: >60 mL/min (ref >60)
GFR calc non Af Amer: >60 mL/min (ref >60)
Glucose, Bld: 86 mg/dL (ref 65–99)
Potassium: 4.1 mmol/L (ref 3.5–5.1)
Sodium: 137 mmol/L (ref 135–145)

## 2017-12-20 LAB — TYPE AND SCREEN
ABO/RH(D): A POS
Antibody Screen: NEGATIVE

## 2017-12-20 LAB — GLUCOSE, CAPILLARY: Glucose-Capillary: 88 mg/dL (ref 65–99)

## 2017-12-20 LAB — HEMOGLOBIN A1C
Hgb A1c MFr Bld: 5.8 % — ABNORMAL HIGH (ref 4.8–5.6)
Mean Plasma Glucose: 119.76 mg/dL

## 2017-12-20 NOTE — Pre-Procedure Instructions (Signed)
Amanda [name]  12/20/2017      [facility] [place], [place] - [address] [place] [address] Phone: [phone] Fax: [phone]    Your procedure is scheduled on January 24  Report to [facility] at [place].M.  Call this number if you have problems the morning of surgery:  [phone]   Remember:  Do not eat food or drink liquids after midnight.  Continue all medications as directed by your physician except follow these medication instructions before surgery below   Take these medicines the morning of surgery with A SIP OF WATER  albuterol (PROVENTIL HFA;VENTOLIN HFA) buPROPion (WELLBUTRIN XL)  clonazePAM (KLONOPIN)  cyclobenzaprine (FLEXERIL) FLUoxetine (PROZAC) metoprolol tartrate (LOPRESSOR) ranitidine (ZANTAC)  Follow your doctors instructions regarding your Aspirin.  If no instructions were given by your doctor, then you will need to call the prescribing office office to get instructions.    7 days prior to surgery STOP taking any Aspirin(unless otherwise instructed by your surgeon), Aleve, Naproxen, Ibuprofen, Motrin, Advil, Goody's, BC's, all herbal medications, fish oil, and all vitamins    Do not wear jewelry, make-up or nail polish.  Do not wear lotions, powders, or perfumes, or deodorant.  Do not shave 48 hours prior to surgery.   Do not bring valuables to the hospital.  [facility] is not responsible for any belongings or valuables.  Contacts, dentures or bridgework may not be worn into surgery.  Leave your suitcase in the car.  After surgery it may be brought to your room.  For patients admitted to the hospital, discharge time will be determined by your treatment team.  Patients discharged the day of surgery will not be allowed to drive home.    Special instructions:   [facility]- Preparing For Surgery  Before surgery, you can play an important role. Because skin is not sterile, your skin  needs to be as free of germs as possible. You can reduce the number of germs on your skin by washing with CHG (chlorahexidine gluconate) Soap before surgery.  CHG is an antiseptic cleaner which kills germs and bonds with the skin to continue killing germs even after washing.  Please do not use if you have an allergy to CHG or antibacterial soaps. If your skin becomes reddened/irritated stop using the CHG.  Do not shave (including legs and underarms) for at least 48 hours prior to first CHG shower. It is OK to shave your face.  Please follow these instructions carefully.   1. Shower the NIGHT BEFORE SURGERY and the MORNING OF SURGERY with CHG.   2. If you chose to wash your hair, wash your hair first as usual with your normal shampoo.  3. After you shampoo, rinse your hair and body thoroughly to remove the shampoo.  4. Use CHG as you would any other liquid soap. You can apply CHG directly to the skin and wash gently with a scrungie or a clean washcloth.   5. Apply the CHG Soap to your body ONLY FROM THE NECK DOWN.  Do not use on open wounds or open sores. Avoid contact with your eyes, ears, mouth and genitals (private parts). Wash Face and genitals (private parts)  with your normal soap.  6. Wash thoroughly, paying special attention to the area where your surgery will be performed.  7. Thoroughly rinse your body with warm water from the neck down.  8. DO NOT shower/wash with your normal soap after  using and rinsing off the CHG Soap.  9. Pat yourself dry with a CLEAN TOWEL.  10. Wear CLEAN PAJAMAS to bed the night before surgery, wear comfortable clothes the morning of surgery  11. Place CLEAN SHEETS on your bed the night of your first shower and DO NOT SLEEP WITH PETS.    Day of Surgery: Do not apply any deodorants/lotions. Please wear clean clothes to the hospital/surgery center.      Please read over the following fact sheets that you were given.

## 2017-12-20 NOTE — Progress Notes (Signed)
PCP - Frederico Hamman [place] Cardiologist - Dr Fletcher Anon  Chest x-ray - not needed EKG - 12/20/17 Stress Test - denies ECHO - 2017 Cardiac Cath - denies    Fasting Blood Sugar - 90-130s diet controlled Checks Blood Sugar ___2__ times a day  Blood Thinner Instructions: Aspirin Instructions: Dr. Forbes Cellar office called left message with Almedia Balls for aspirin instructions  Anesthesia review: YES  Patient denies shortness of breath, fever, cough and chest pain at PAT appointment   Patient verbalized understanding of instructions that were given to them at the PAT appointment. Patient was also instructed that they will need to review over the PAT instructions again at home before surgery.

## 2017-12-21 LAB — SURGICAL PCR SCREEN
MRSA, PCR: NEGATIVE
Staphylococcus aureus: POSITIVE — AB

## 2017-12-21 LAB — URINE CULTURE: Culture: <10000 — AB

## 2017-12-21 NOTE — Progress Notes (Signed)
Dr. Forbes Cellar office notified of prescription called in for positive PCR

## 2017-12-21 NOTE — Progress Notes (Signed)
Needs the nose treatment with Betadine please call thanks

## 2017-12-21 NOTE — Progress Notes (Signed)
perscription called in at [address] [place], left message for patient with instructions and perscription location for pick up

## 2017-12-21 NOTE — Progress Notes (Signed)
Anesthesia Chart Review:  Pt is a 67 year old female scheduled for our total knee arthroplasty in 12/30/2017 with [name], MD  - PCP is Owens Loffler, MD - Cardiologist is Kathlyn Sacramento, MD. Last office visit 07/17/16; 1 year f/u recommended.   PMH includes: Aortic stenosis (mild by 08/05/16 echo), HTN, DM, hyperlipidemia, PACs, hypothyroidism (as a teenager), GERD.  Current smoker.  BMI 33. S/p L TKA 12/24/15.   Medications include: Albuterol, ASA 81 mg, Lipitor, metoprolol, Zantac  BP 133/71   Pulse 77   Temp 36.7 C   Resp 20   Ht 5\' 8"  (1.727 m)   Wt 215 lb 1.6 oz (97.6 kg)   SpO2 98%   BMI 32.71 kg/m   Preoperative labs reviewed.   - HbA1c 5.8, glucose 86  EKG 12/20/17: NSR. RBBB.   Echo 08/05/16:  - Left ventricle: The cavity size was normal. There was mild focal basal hypertrophy of the septum. Systolic function was normal. The estimated ejection fraction was in the range of 60% to 65%. Wall motion was normal; there were no regional wall motion abnormalities. Left ventricular diastolic function parameters were normal for the patient&'s age. - Aortic valve: Calcified annulus. Probably trileaflet. There was mild stenosis, based on peak velocity and mean gradient. There was moderate regurgitation. Valve area (Vmean): 1.83 cm^2. Indexed valve area (Vmean): 0.83 cm^2/m^2.  Mean gradient (S): 11 mm Hg. Peak gradient (S): 22 mm Hg.  Reviewed case with Dr. Orene Desanctis. If no changes, I anticipate pt can proceed with surgery as scheduled.   Willeen Cass, FNP-BC [facility]/Anesthesiology Phone: [phone] 12/21/2017 4:15 PM

## 2017-12-22 ENCOUNTER — Telehealth: Payer: Self-pay | Admitting: Cardiovascular Disease

## 2017-12-22 ENCOUNTER — Other Ambulatory Visit (INDEPENDENT_AMBULATORY_CARE_PROVIDER_SITE_OTHER): Payer: Self-pay

## 2017-12-22 MED ORDER — MUPIROCIN 2 % EX OINT: TOPICAL_OINTMENT | CUTANEOUS | 0 refills | Status: DC

## 2017-12-22 NOTE — Telephone Encounter (Signed)
Pt stopped by w/ her husband.  She reports that she is scheduled for upcoming rt TKR on 12/30/17. She was advised as to what meds to hold, but was advised to contact her cardiologist regarding ASA & metoprolol. She would like to know if she should hold aspirin 81 mg and metoprolol 25 mg prior to procedure or if she should continue to take the am of her procedure. Pt would like a response ASAP.  Advised her that I will make Dr. Fletcher Anon aware and his nurse will call back w/ his recommendation.

## 2017-12-24 NOTE — Telephone Encounter (Signed)
Left detailed message on pt identified VM (ok per DPR) w/recommendations. Provided call back number if questions.

## 2017-12-24 NOTE — Telephone Encounter (Signed)
Do not hold metoprolol but hold aspirin 1 week before surgery.

## 2017-12-29 MED ORDER — TRANEXAMIC ACID 1000 MG/10ML IV SOLN
1000.0000 mg | INTRAVENOUS | Status: AC
  Administered 2017-12-30: 1000 mg via INTRAVENOUS
  Filled 2017-12-29: qty 1100

## 2017-12-29 NOTE — Anesthesia Preprocedure Evaluation (Signed)
Anesthesia Evaluation  Patient identified by MRN, date of birth, ID band Patient awake    Reviewed: Allergy & Precautions, H&P , NPO status , Patient's Chart, lab work & pertinent test results  History of Anesthesia Complications Negative for: history of anesthetic complications  Airway Mallampati: II  TM Distance: >3 FB Neck ROM: full    Dental no notable dental hx.    Pulmonary shortness of breath, pneumonia, Current Smoker,    Pulmonary exam normal breath sounds clear to auscultation       Cardiovascular hypertension, Normal cardiovascular exam Rhythm:regular Rate:Normal  2013 echo with normal EF, mild MR, normal RV function and normal PA pressures Takes metoprolol to control palpitations   Neuro/Psych  Headaches, PSYCHIATRIC DISORDERS Anxiety Depression  Neuromuscular disease    GI/Hepatic Neg liver ROS, GERD  ,  Endo/Other  diabetes  Renal/GU negative Renal ROS     Musculoskeletal  (+) Arthritis ,   Abdominal   Peds  Hematology negative hematology ROS (+)   Anesthesia Other Findings Denies blood thinning meds besides aspirin, no difficulties with past anesthetics, denies cardiac or pulmonary issues  Reproductive/Obstetrics negative OB ROS                             Anesthesia Physical  Anesthesia Plan  ASA: III  Anesthesia Plan: Regional and General   Post-op Pain Management: GA combined w/ Regional for post-op pain   Induction: Intravenous  PONV Risk Score and Plan: 3 and Ondansetron, Propofol infusion, Treatment may vary due to age or medical condition and Dexamethasone  Airway Management Planned: LMA  Additional Equipment:   Intra-op Plan:   Post-operative Plan: Extubation in OR  Informed Consent: I have reviewed the patients History and Physical, chart, labs and discussed the procedure including the risks, benefits and alternatives for the proposed anesthesia with  the patient or authorized representative who has indicated his/her understanding and acceptance.   Dental advisory given  Plan Discussed with: CRNA  Anesthesia Plan Comments:         Anesthesia Quick Evaluation

## 2017-12-30 ENCOUNTER — Encounter (HOSPITAL_COMMUNITY)
Admission: RE | Disposition: A | Discharge status: Skilled Nursing Facility | Payer: Self-pay | Source: Ambulatory Visit | Attending: Orthopedic Surgery

## 2017-12-30 ENCOUNTER — Inpatient Hospital Stay (HOSPITAL_COMMUNITY)
Admission: RE | Admit: 2017-12-30 | Discharge: 2018-01-02 | DRG: 470 | Disposition: A | Discharge status: Skilled Nursing Facility | Payer: Medicare Other | Source: Ambulatory Visit | Attending: Orthopedic Surgery | Admitting: Orthopedic Surgery

## 2017-12-30 ENCOUNTER — Inpatient Hospital Stay (HOSPITAL_COMMUNITY): Payer: Medicare Other | Admitting: Emergency Medicine

## 2017-12-30 ENCOUNTER — Inpatient Hospital Stay (HOSPITAL_COMMUNITY): Payer: Medicare Other | Admitting: Anesthesiology

## 2017-12-30 ENCOUNTER — Encounter (HOSPITAL_COMMUNITY): Payer: Self-pay | Admitting: Certified Registered Nurse Anesthetist

## 2017-12-30 DIAGNOSIS — Z91030 Bee allergy status: Secondary | ICD-10-CM | POA: Diagnosis not present

## 2017-12-30 DIAGNOSIS — Z888 Allergy status to other drugs, medicaments and biological substances status: Secondary | ICD-10-CM

## 2017-12-30 DIAGNOSIS — K219 Gastro-esophageal reflux disease without esophagitis: Secondary | ICD-10-CM | POA: Diagnosis present

## 2017-12-30 DIAGNOSIS — E119 Type 2 diabetes mellitus without complications: Secondary | ICD-10-CM | POA: Diagnosis present

## 2017-12-30 DIAGNOSIS — M810 Age-related osteoporosis without current pathological fracture: Secondary | ICD-10-CM | POA: Diagnosis present

## 2017-12-30 DIAGNOSIS — Z8249 Family history of ischemic heart disease and other diseases of the circulatory system: Secondary | ICD-10-CM

## 2017-12-30 DIAGNOSIS — E7800 Pure hypercholesterolemia, unspecified: Secondary | ICD-10-CM | POA: Diagnosis present

## 2017-12-30 DIAGNOSIS — K449 Diaphragmatic hernia without obstruction or gangrene: Secondary | ICD-10-CM | POA: Diagnosis present

## 2017-12-30 DIAGNOSIS — M1711 Unilateral primary osteoarthritis, right knee: Secondary | ICD-10-CM | POA: Diagnosis not present

## 2017-12-30 DIAGNOSIS — Z9049 Acquired absence of other specified parts of digestive tract: Secondary | ICD-10-CM | POA: Diagnosis not present

## 2017-12-30 DIAGNOSIS — F419 Anxiety disorder, unspecified: Secondary | ICD-10-CM | POA: Diagnosis present

## 2017-12-30 DIAGNOSIS — F17210 Nicotine dependence, cigarettes, uncomplicated: Secondary | ICD-10-CM | POA: Diagnosis not present

## 2017-12-30 DIAGNOSIS — Z90710 Acquired absence of both cervix and uterus: Secondary | ICD-10-CM

## 2017-12-30 DIAGNOSIS — E785 Hyperlipidemia, unspecified: Secondary | ICD-10-CM | POA: Diagnosis present

## 2017-12-30 DIAGNOSIS — R911 Solitary pulmonary nodule: Secondary | ICD-10-CM | POA: Diagnosis present

## 2017-12-30 DIAGNOSIS — R2689 Other abnormalities of gait and mobility: Secondary | ICD-10-CM | POA: Diagnosis not present

## 2017-12-30 DIAGNOSIS — I350 Nonrheumatic aortic (valve) stenosis: Secondary | ICD-10-CM | POA: Diagnosis not present

## 2017-12-30 DIAGNOSIS — F329 Major depressive disorder, single episode, unspecified: Secondary | ICD-10-CM | POA: Diagnosis present

## 2017-12-30 DIAGNOSIS — Z96652 Presence of left artificial knee joint: Secondary | ICD-10-CM | POA: Diagnosis present

## 2017-12-30 DIAGNOSIS — R51 Headache: Secondary | ICD-10-CM

## 2017-12-30 DIAGNOSIS — G8911 Acute pain due to trauma: Secondary | ICD-10-CM | POA: Diagnosis not present

## 2017-12-30 DIAGNOSIS — Z8349 Family history of other endocrine, nutritional and metabolic diseases: Secondary | ICD-10-CM

## 2017-12-30 DIAGNOSIS — M1710 Unilateral primary osteoarthritis, unspecified knee: Secondary | ICD-10-CM | POA: Diagnosis present

## 2017-12-30 DIAGNOSIS — J449 Chronic obstructive pulmonary disease, unspecified: Secondary | ICD-10-CM | POA: Diagnosis present

## 2017-12-30 DIAGNOSIS — S8990XA Unspecified injury of unspecified lower leg, initial encounter: Secondary | ICD-10-CM | POA: Diagnosis not present

## 2017-12-30 DIAGNOSIS — G8918 Other acute postprocedural pain: Secondary | ICD-10-CM | POA: Diagnosis not present

## 2017-12-30 DIAGNOSIS — M6281 Muscle weakness (generalized): Secondary | ICD-10-CM | POA: Diagnosis not present

## 2017-12-30 DIAGNOSIS — Z833 Family history of diabetes mellitus: Secondary | ICD-10-CM | POA: Diagnosis not present

## 2017-12-30 DIAGNOSIS — Z471 Aftercare following joint replacement surgery: Secondary | ICD-10-CM | POA: Diagnosis not present

## 2017-12-30 DIAGNOSIS — I10 Essential (primary) hypertension: Secondary | ICD-10-CM | POA: Diagnosis present

## 2017-12-30 DIAGNOSIS — M25561 Pain in right knee: Secondary | ICD-10-CM | POA: Diagnosis not present

## 2017-12-30 DIAGNOSIS — M25761 Osteophyte, right knee: Secondary | ICD-10-CM | POA: Diagnosis present

## 2017-12-30 DIAGNOSIS — R519 Headache, unspecified: Secondary | ICD-10-CM

## 2017-12-30 HISTORY — PX: TOTAL KNEE ARTHROPLASTY: SHX125

## 2017-12-30 LAB — GLUCOSE, CAPILLARY: Glucose-Capillary: 107 mg/dL — ABNORMAL HIGH (ref 65–99)

## 2017-12-30 SURGERY — ARTHROPLASTY, KNEE, TOTAL
Anesthesia: Regional | Laterality: Right

## 2017-12-30 MED ORDER — CLONIDINE HCL (ANALGESIA) 100 MCG/ML EP SOLN
150.0000 ug | Freq: Once | EPIDURAL | Status: AC
  Administered 2017-12-30: 100 ug via INTRA_ARTICULAR
  Filled 2017-12-30: qty 1.5

## 2017-12-30 MED ORDER — BUPIVACAINE LIPOSOME 1.3 % IJ SUSP
20.0000 mL | INTRAMUSCULAR | Status: AC
  Administered 2017-12-30: 20 mL
  Filled 2017-12-30: qty 20

## 2017-12-30 MED ORDER — ATORVASTATIN CALCIUM 40 MG PO TABS
40.0000 mg | ORAL_TABLET | Freq: Every day | ORAL | Status: DC
  Administered 2017-12-30 – 2018-01-02 (×4): 40 mg via ORAL
  Filled 2017-12-30 (×4): qty 1

## 2017-12-30 MED ORDER — MIDAZOLAM HCL 2 MG/2ML IJ SOLN
INTRAMUSCULAR | Status: AC
  Filled 2017-12-30: qty 2

## 2017-12-30 MED ORDER — TRANEXAMIC ACID 1000 MG/10ML IV SOLN
2000.0000 mg | INTRAVENOUS | Status: AC
  Administered 2017-12-30: 2000 mg via TOPICAL
  Filled 2017-12-30: qty 20

## 2017-12-30 MED ORDER — POTASSIUM CHLORIDE IN NACL 20-0.9 MEQ/L-% IV SOLN
INTRAVENOUS | Status: AC
  Administered 2017-12-30 – 2017-12-31 (×2): via INTRAVENOUS
  Filled 2017-12-30 (×2): qty 1000

## 2017-12-30 MED ORDER — FLUOXETINE HCL 20 MG PO CAPS
40.0000 mg | ORAL_CAPSULE | Freq: Two times a day (BID) | ORAL | Status: DC
  Administered 2017-12-30 – 2018-01-02 (×6): 40 mg via ORAL
  Filled 2017-12-30 (×6): qty 2

## 2017-12-30 MED ORDER — MENTHOL 3 MG MT LOZG
1.0000 | LOZENGE | OROMUCOSAL | Status: DC | PRN
  Administered 2017-12-31: 3 mg via ORAL
  Filled 2017-12-30: qty 9

## 2017-12-30 MED ORDER — OXYCODONE HCL 5 MG PO TABS: 5.0000 mg | ORAL_TABLET | Freq: Once | ORAL | Status: DC | PRN

## 2017-12-30 MED ORDER — PROPOFOL 10 MG/ML IV BOLUS
INTRAVENOUS | Status: DC | PRN
  Administered 2017-12-30: 160 mg via INTRAVENOUS
  Administered 2017-12-30: 40 mg via INTRAVENOUS

## 2017-12-30 MED ORDER — RIVAROXABAN 10 MG PO TABS
10.0000 mg | ORAL_TABLET | Freq: Every day | ORAL | Status: DC
  Administered 2017-12-31 – 2018-01-02 (×3): 10 mg via ORAL
  Filled 2017-12-30 (×3): qty 1

## 2017-12-30 MED ORDER — PROMETHAZINE HCL 25 MG/ML IJ SOLN: 6.2500 mg | INTRAMUSCULAR | Status: DC | PRN

## 2017-12-30 MED ORDER — KETOROLAC TROMETHAMINE 30 MG/ML IJ SOLN
INTRAMUSCULAR | Status: AC
  Filled 2017-12-30: qty 1

## 2017-12-30 MED ORDER — MORPHINE SULFATE (PF) 4 MG/ML IV SOLN
INTRAVENOUS | Status: AC
  Filled 2017-12-30: qty 1

## 2017-12-30 MED ORDER — SODIUM CHLORIDE 0.9 % IR SOLN
Status: DC | PRN
  Administered 2017-12-30: 3000 mL

## 2017-12-30 MED ORDER — 0.9 % SODIUM CHLORIDE (POUR BTL) OPTIME
TOPICAL | Status: DC | PRN
  Administered 2017-12-30: 1000 mL

## 2017-12-30 MED ORDER — MAGNESIUM OXIDE 400 (241.3 MG) MG PO TABS
400.0000 mg | ORAL_TABLET | Freq: Every day | ORAL | Status: DC
  Administered 2017-12-30 – 2018-01-02 (×4): 400 mg via ORAL
  Filled 2017-12-30 (×4): qty 1

## 2017-12-30 MED ORDER — ONDANSETRON HCL 4 MG/2ML IJ SOLN
4.0000 mg | Freq: Four times a day (QID) | INTRAMUSCULAR | Status: DC | PRN
  Administered 2018-01-01: 4 mg via INTRAVENOUS
  Filled 2017-12-30: qty 2

## 2017-12-30 MED ORDER — BUPIVACAINE HCL (PF) 0.5 % IJ SOLN
INTRAMUSCULAR | Status: DC | PRN
  Administered 2017-12-30: 20 mL via INTRA_ARTICULAR

## 2017-12-30 MED ORDER — METOCLOPRAMIDE HCL 5 MG PO TABS: 5.0000 mg | ORAL_TABLET | Freq: Three times a day (TID) | ORAL | Status: DC | PRN

## 2017-12-30 MED ORDER — METOPROLOL TARTRATE 12.5 MG HALF TABLET
12.5000 mg | ORAL_TABLET | Freq: Two times a day (BID) | ORAL | Status: DC
  Administered 2017-12-30 – 2018-01-02 (×6): 12.5 mg via ORAL
  Filled 2017-12-30 (×6): qty 1

## 2017-12-30 MED ORDER — BUPIVACAINE HCL (PF) 0.5 % IJ SOLN
INTRAMUSCULAR | Status: AC
  Filled 2017-12-30: qty 30

## 2017-12-30 MED ORDER — METOCLOPRAMIDE HCL 5 MG/ML IJ SOLN: 5.0000 mg | Freq: Three times a day (TID) | INTRAMUSCULAR | Status: DC | PRN

## 2017-12-30 MED ORDER — OXYCODONE HCL 5 MG/5ML PO SOLN: 5.0000 mg | Freq: Once | ORAL | Status: DC | PRN

## 2017-12-30 MED ORDER — ROPIVACAINE HCL 7.5 MG/ML IJ SOLN
INTRAMUSCULAR | Status: DC | PRN
  Administered 2017-12-30: 20 mL via PERINEURAL

## 2017-12-30 MED ORDER — CHLORHEXIDINE GLUCONATE 4 % EX LIQD: 60.0000 mL | Freq: Once | CUTANEOUS | Status: DC

## 2017-12-30 MED ORDER — VITAMIN C 500 MG PO TABS
1000.0000 mg | ORAL_TABLET | Freq: Every day | ORAL | Status: DC
  Administered 2017-12-30 – 2018-01-02 (×4): 1000 mg via ORAL
  Filled 2017-12-30 (×4): qty 2

## 2017-12-30 MED ORDER — PHENYLEPHRINE HCL 10 MG/ML IJ SOLN
INTRAMUSCULAR | Status: DC | PRN
  Administered 2017-12-30 (×3): 80 ug via INTRAVENOUS
  Administered 2017-12-30 (×2): 40 ug via INTRAVENOUS
  Administered 2017-12-30: 80 ug via INTRAVENOUS

## 2017-12-30 MED ORDER — OXYCODONE HCL 5 MG PO TABS
ORAL_TABLET | ORAL | Status: AC
  Filled 2017-12-30: qty 2

## 2017-12-30 MED ORDER — ACETAMINOPHEN 325 MG PO TABS
650.0000 mg | ORAL_TABLET | ORAL | Status: DC | PRN
Start: 2017-12-30 — End: 2018-01-02
  Administered 2017-12-31: 650 mg via ORAL
  Filled 2017-12-30: qty 2

## 2017-12-30 MED ORDER — OXYCODONE HCL 5 MG PO TABS
10.0000 mg | ORAL_TABLET | ORAL | Status: DC | PRN
  Administered 2017-12-30 – 2018-01-02 (×15): 10 mg via ORAL
  Filled 2017-12-30 (×14): qty 2

## 2017-12-30 MED ORDER — LIDOCAINE HCL (CARDIAC) 20 MG/ML IV SOLN
INTRAVENOUS | Status: DC | PRN
  Administered 2017-12-30: 100 mg via INTRAVENOUS

## 2017-12-30 MED ORDER — PHENOL 1.4 % MT LIQD: 1.0000 | OROMUCOSAL | Status: DC | PRN

## 2017-12-30 MED ORDER — PROPOFOL 10 MG/ML IV BOLUS
INTRAVENOUS | Status: AC
  Filled 2017-12-30: qty 40

## 2017-12-30 MED ORDER — PHENYLEPHRINE 40 MCG/ML (10ML) SYRINGE FOR IV PUSH (FOR BLOOD PRESSURE SUPPORT)
PREFILLED_SYRINGE | INTRAVENOUS | Status: AC
  Filled 2017-12-30: qty 20

## 2017-12-30 MED ORDER — KETOROLAC TROMETHAMINE 30 MG/ML IJ SOLN
30.0000 mg | Freq: Once | INTRAMUSCULAR | Status: DC | PRN
  Administered 2017-12-30: 30 mg via INTRAVENOUS

## 2017-12-30 MED ORDER — CALCIUM POLYCARBOPHIL 625 MG PO TABS
625.0000 mg | ORAL_TABLET | Freq: Every day | ORAL | Status: DC
  Administered 2017-12-30 – 2018-01-02 (×4): 625 mg via ORAL
  Filled 2017-12-30 (×4): qty 1

## 2017-12-30 MED ORDER — PROPOFOL 10 MG/ML IV BOLUS
INTRAVENOUS | Status: AC
  Filled 2017-12-30: qty 20

## 2017-12-30 MED ORDER — CEFAZOLIN SODIUM-DEXTROSE 2-4 GM/100ML-% IV SOLN
2.0000 g | INTRAVENOUS | Status: AC
  Administered 2017-12-30: 2 g via INTRAVENOUS
  Filled 2017-12-30: qty 100

## 2017-12-30 MED ORDER — CLONAZEPAM 0.5 MG PO TABS: 0.5000 mg | ORAL_TABLET | Freq: Two times a day (BID) | ORAL | Status: DC | PRN

## 2017-12-30 MED ORDER — MORPHINE SULFATE (PF) 4 MG/ML IV SOLN
INTRAVENOUS | Status: DC | PRN
  Administered 2017-12-30: 8 mg via SUBCUTANEOUS

## 2017-12-30 MED ORDER — AMITRIPTYLINE HCL 10 MG PO TABS
10.0000 mg | ORAL_TABLET | Freq: Every day | ORAL | Status: DC
  Administered 2017-12-30 – 2018-01-01 (×3): 10 mg via ORAL
  Filled 2017-12-30 (×3): qty 1

## 2017-12-30 MED ORDER — ACETAMINOPHEN 650 MG RE SUPP: 650.0000 mg | RECTAL | Status: DC | PRN

## 2017-12-30 MED ORDER — DEXAMETHASONE SODIUM PHOSPHATE 10 MG/ML IJ SOLN
INTRAMUSCULAR | Status: AC
  Filled 2017-12-30: qty 2

## 2017-12-30 MED ORDER — CEFAZOLIN SODIUM-DEXTROSE 2-4 GM/100ML-% IV SOLN
2.0000 g | Freq: Four times a day (QID) | INTRAVENOUS | Status: AC
  Administered 2017-12-30 (×2): 2 g via INTRAVENOUS
  Filled 2017-12-30 (×2): qty 100

## 2017-12-30 MED ORDER — ONDANSETRON HCL 4 MG/2ML IJ SOLN
INTRAMUSCULAR | Status: AC
  Filled 2017-12-30: qty 4

## 2017-12-30 MED ORDER — FENTANYL CITRATE (PF) 250 MCG/5ML IJ SOLN
INTRAMUSCULAR | Status: AC
  Filled 2017-12-30: qty 5

## 2017-12-30 MED ORDER — BUPIVACAINE HCL 0.5 % IJ SOLN
INTRAMUSCULAR | Status: DC | PRN
  Administered 2017-12-30: 10 mL

## 2017-12-30 MED ORDER — ADULT MULTIVITAMIN W/MINERALS CH
1.0000 | ORAL_TABLET | Freq: Every day | ORAL | Status: DC
  Administered 2017-12-30 – 2018-01-02 (×5): 1 via ORAL
  Filled 2017-12-30 (×6): qty 1

## 2017-12-30 MED ORDER — SODIUM CHLORIDE 0.9% FLUSH
INTRAVENOUS | Status: DC | PRN
  Administered 2017-12-30: 20 mL

## 2017-12-30 MED ORDER — ALBUTEROL SULFATE (2.5 MG/3ML) 0.083% IN NEBU: 3.0000 mL | INHALATION_SOLUTION | RESPIRATORY_TRACT | Status: DC | PRN

## 2017-12-30 MED ORDER — MORPHINE SULFATE (PF) 4 MG/ML IV SOLN
INTRAVENOUS | Status: AC
  Filled 2017-12-30: qty 2

## 2017-12-30 MED ORDER — CYCLOBENZAPRINE HCL 10 MG PO TABS
10.0000 mg | ORAL_TABLET | Freq: Three times a day (TID) | ORAL | Status: DC | PRN
  Administered 2017-12-31: 10 mg via ORAL
  Filled 2017-12-30: qty 1

## 2017-12-30 MED ORDER — DEXAMETHASONE SODIUM PHOSPHATE 4 MG/ML IJ SOLN
INTRAMUSCULAR | Status: DC | PRN
  Administered 2017-12-30: 5 mg via INTRAVENOUS

## 2017-12-30 MED ORDER — FENTANYL CITRATE (PF) 250 MCG/5ML IJ SOLN
INTRAMUSCULAR | Status: DC | PRN
  Administered 2017-12-30 (×5): 25 ug via INTRAVENOUS

## 2017-12-30 MED ORDER — DIPHENHYDRAMINE HCL 25 MG PO CAPS
25.0000 mg | ORAL_CAPSULE | Freq: Two times a day (BID) | ORAL | Status: DC
  Administered 2017-12-30 – 2018-01-02 (×6): 25 mg via ORAL
  Filled 2017-12-30 (×6): qty 1

## 2017-12-30 MED ORDER — ONDANSETRON HCL 4 MG PO TABS: 4.0000 mg | ORAL_TABLET | Freq: Four times a day (QID) | ORAL | Status: DC | PRN

## 2017-12-30 MED ORDER — ONDANSETRON HCL 4 MG/2ML IJ SOLN
INTRAMUSCULAR | Status: DC | PRN
  Administered 2017-12-30: 4 mg via INTRAVENOUS

## 2017-12-30 MED ORDER — FAMOTIDINE 20 MG PO TABS
20.0000 mg | ORAL_TABLET | Freq: Every day | ORAL | Status: DC
  Administered 2017-12-30 – 2018-01-02 (×4): 20 mg via ORAL
  Filled 2017-12-30 (×4): qty 1

## 2017-12-30 MED ORDER — MEPERIDINE HCL 25 MG/ML IJ SOLN: 6.2500 mg | INTRAMUSCULAR | Status: DC | PRN

## 2017-12-30 MED ORDER — LIDOCAINE 2% (20 MG/ML) 5 ML SYRINGE
INTRAMUSCULAR | Status: AC
  Filled 2017-12-30: qty 5

## 2017-12-30 MED ORDER — BUPROPION HCL ER (XL) 150 MG PO TB24
150.0000 mg | ORAL_TABLET | Freq: Every day | ORAL | Status: DC
  Administered 2017-12-31 – 2018-01-02 (×3): 150 mg via ORAL
  Filled 2017-12-30 (×4): qty 1

## 2017-12-30 MED ORDER — HYDROMORPHONE HCL 1 MG/ML IJ SOLN
0.2500 mg | INTRAMUSCULAR | Status: DC | PRN
  Administered 2017-12-30: 0.5 mg via INTRAVENOUS

## 2017-12-30 MED ORDER — MORPHINE SULFATE (PF) 2 MG/ML IV SOLN
2.0000 mg | INTRAVENOUS | Status: DC | PRN
  Administered 2017-12-30 – 2017-12-31 (×5): 2 mg via INTRAVENOUS
  Filled 2017-12-30 (×4): qty 1

## 2017-12-30 MED ORDER — LACTATED RINGERS IV SOLN
INTRAVENOUS | Status: DC | PRN
  Administered 2017-12-30 (×2): via INTRAVENOUS

## 2017-12-30 MED ORDER — VITAMIN D 1000 UNITS PO TABS
1000.0000 [IU] | ORAL_TABLET | Freq: Two times a day (BID) | ORAL | Status: DC
  Administered 2017-12-30 – 2018-01-02 (×6): 1000 [IU] via ORAL
  Filled 2017-12-30 (×6): qty 1

## 2017-12-30 MED ORDER — HYDROMORPHONE HCL 1 MG/ML IJ SOLN
INTRAMUSCULAR | Status: AC
  Filled 2017-12-30: qty 1

## 2017-12-30 SURGICAL SUPPLY — 69 items
BAG DECANTER FOR FLEXI CONT (MISCELLANEOUS) ×2 IMPLANT
BANDAGE ELASTIC 6 VELCRO ST LF (GAUZE/BANDAGES/DRESSINGS) ×2 IMPLANT
BANDAGE ESMARK 6X9 LF (GAUZE/BANDAGES/DRESSINGS) ×1 IMPLANT
BLADE SAG 18X100X1.27 (BLADE) ×2 IMPLANT
BLADE SAW SGTL 13.0X1.19X90.0M (BLADE) IMPLANT
BNDG COHESIVE 6X5 TAN STRL LF (GAUZE/BANDAGES/DRESSINGS) ×2 IMPLANT
BNDG ELASTIC 6X15 VLCR STRL LF (GAUZE/BANDAGES/DRESSINGS) ×2 IMPLANT
BNDG ESMARK 6X9 LF (GAUZE/BANDAGES/DRESSINGS) ×2
BOWL SMART MIX CTS (DISPOSABLE) IMPLANT
CAPT KNEE TOTAL 3 ×2 IMPLANT
CEMENT BONE SIMPLEX SPEEDSET (Cement) ×4 IMPLANT
CONT SPECI 4OZ STER CLIK (MISCELLANEOUS) ×2 IMPLANT
COVER SURGICAL LIGHT HANDLE (MISCELLANEOUS) ×2 IMPLANT
CUFF TOURNIQUET SINGLE 34IN LL (TOURNIQUET CUFF) ×2 IMPLANT
CUFF TOURNIQUET SINGLE 44IN (TOURNIQUET CUFF) IMPLANT
DECANTER SPIKE VIAL GLASS SM (MISCELLANEOUS) ×2 IMPLANT
DRAPE INCISE IOBAN 66X45 STRL (DRAPES) IMPLANT
DRAPE ORTHO SPLIT 77X108 STRL (DRAPES) ×3
DRAPE SURG ORHT 6 SPLT 77X108 (DRAPES) ×3 IMPLANT
DRAPE U-SHAPE 47X51 STRL (DRAPES) ×2 IMPLANT
DRSG AQUACEL AG ADV 3.5X14 (GAUZE/BANDAGES/DRESSINGS) ×2 IMPLANT
DURAPREP 26ML APPLICATOR (WOUND CARE) ×4 IMPLANT
ELECT CAUTERY BLADE 6.4 (BLADE) ×2 IMPLANT
ELECT REM PT RETURN 9FT ADLT (ELECTROSURGICAL) ×2
ELECTRODE REM PT RTRN 9FT ADLT (ELECTROSURGICAL) ×1 IMPLANT
GAUZE SPONGE 4X4 12PLY STRL (GAUZE/BANDAGES/DRESSINGS) ×2 IMPLANT
GLOVE BIOGEL PI IND STRL 7.5 (GLOVE) ×1 IMPLANT
GLOVE BIOGEL PI IND STRL 8 (GLOVE) ×1 IMPLANT
GLOVE BIOGEL PI INDICATOR 7.5 (GLOVE) ×1
GLOVE BIOGEL PI INDICATOR 8 (GLOVE) ×1
GLOVE ECLIPSE 7.0 STRL STRAW (GLOVE) ×2 IMPLANT
GLOVE SURG ORTHO 8.0 STRL STRW (GLOVE) ×2 IMPLANT
GOWN STRL REUS W/ TWL LRG LVL3 (GOWN DISPOSABLE) ×3 IMPLANT
GOWN STRL REUS W/TWL LRG LVL3 (GOWN DISPOSABLE) ×3
HANDPIECE INTERPULSE COAX TIP (DISPOSABLE) ×1
HOOD PEEL AWAY FLYTE STAYCOOL (MISCELLANEOUS) ×6 IMPLANT
IMMOBILIZER KNEE 20 (SOFTGOODS) IMPLANT
IMMOBILIZER KNEE 22 UNIV (SOFTGOODS) IMPLANT
IMMOBILIZER KNEE 24 THIGH 36 (MISCELLANEOUS) IMPLANT
IMMOBILIZER KNEE 24 UNIV (MISCELLANEOUS)
KIT BASIN OR (CUSTOM PROCEDURE TRAY) ×2 IMPLANT
KIT ROOM TURNOVER OR (KITS) ×2 IMPLANT
MANIFOLD NEPTUNE II (INSTRUMENTS) ×2 IMPLANT
NDL SAFETY ECLIPSE 18X1.5 (NEEDLE) ×1 IMPLANT
NEEDLE 22X1 1/2 (OR ONLY) (NEEDLE) ×4 IMPLANT
NEEDLE HYPO 18GX1.5 SHARP (NEEDLE) ×1
NS IRRIG 1000ML POUR BTL (IV SOLUTION) ×4 IMPLANT
PACK TOTAL JOINT (CUSTOM PROCEDURE TRAY) ×2 IMPLANT
PAD ARMBOARD 7.5X6 YLW CONV (MISCELLANEOUS) ×4 IMPLANT
PAD CAST 4YDX4 CTTN HI CHSV (CAST SUPPLIES) ×1 IMPLANT
PADDING CAST COTTON 4X4 STRL (CAST SUPPLIES) ×1
PADDING CAST COTTON 6X4 STRL (CAST SUPPLIES) ×2 IMPLANT
SET HNDPC FAN SPRY TIP SCT (DISPOSABLE) ×1 IMPLANT
STRIP CLOSURE SKIN 1/2X4 (GAUZE/BANDAGES/DRESSINGS) ×4 IMPLANT
SUCTION FRAZIER HANDLE 10FR (MISCELLANEOUS) ×1
SUCTION TUBE FRAZIER 10FR DISP (MISCELLANEOUS) ×1 IMPLANT
SUT MNCRL AB 3-0 PS2 18 (SUTURE) ×2 IMPLANT
SUT VIC AB 0 CT1 27 (SUTURE) ×3
SUT VIC AB 0 CT1 27XBRD ANBCTR (SUTURE) ×3 IMPLANT
SUT VIC AB 1 CT1 27 (SUTURE) ×5
SUT VIC AB 1 CT1 27XBRD ANBCTR (SUTURE) ×5 IMPLANT
SUT VIC AB 2-0 CT1 27 (SUTURE) ×4
SUT VIC AB 2-0 CT1 TAPERPNT 27 (SUTURE) ×4 IMPLANT
SYR 30ML LL (SYRINGE) ×6 IMPLANT
SYR TB 1ML LUER SLIP (SYRINGE) ×2 IMPLANT
TOWEL OR 17X24 6PK STRL BLUE (TOWEL DISPOSABLE) ×4 IMPLANT
TOWEL OR 17X26 10 PK STRL BLUE (TOWEL DISPOSABLE) ×4 IMPLANT
TRAY CATH 16FR W/PLASTIC CATH (SET/KITS/TRAYS/PACK) IMPLANT
WATER STERILE IRR 1000ML POUR (IV SOLUTION) IMPLANT

## 2017-12-30 NOTE — Brief Op Note (Signed)
12/30/2017  10:40 AM  PATIENT:  Amanda [name]  67 y.o. female  PRE-OPERATIVE DIAGNOSIS:  Osteoarthritis Right Knee  POST-OPERATIVE DIAGNOSIS:  Osteoarthritis Right Knee  PROCEDURE:  Procedure(s): RIGHT TOTAL KNEE ARTHROPLASTY  SURGEON:  Surgeon(s): [name], MD  ASSISTANT: Laure Kidney rnfa  ANESTHESIA:   general  EBL: 75 ml    Total I/O In: 1000 [I.V.:1000] Out: 50 [Blood:50]  BLOOD ADMINISTERED: none  DRAINS: none   LOCAL MEDICATIONS USED:  Marcaine mso4 clonidine exparel  SPECIMEN:  No Specimen  COUNTS:  YES  TOURNIQUET:   Total Tourniquet Time Documented: Thigh (Right) - 98 minutes Total: Thigh (Right) - 98 minutes   DICTATION: .Other Dictation: Dictation Number [id number]  PLAN OF CARE: Admit to inpatient   PATIENT DISPOSITION:  PACU - hemodynamically stable

## 2017-12-30 NOTE — Anesthesia Procedure Notes (Signed)
Anesthesia Regional Block: Adductor canal block   Pre-Anesthetic Checklist: ,, timeout performed, Correct Patient, Correct Site, Correct Laterality, Correct Procedure, Correct Position, site marked, Risks and benefits discussed,  Surgical consent,  Pre-op evaluation,  At surgeon's request and post-op pain management  Laterality: Right  Prep: chloraprep       Needles:  Injection technique: Single-shot  Needle Type: Stimiplex     Needle Length: 9cm  Needle Gauge: 21     Additional Needles:   Procedures:,,,, ultrasound used (permanent image in chart),,,,  Narrative:  Start time: 12/30/2017 7:07 AM End time: 12/30/2017 7:09 AM Injection made incrementally with aspirations every 5 mL.  Performed by: Personally  Anesthesiologist: Nolon Nations, MD  Additional Notes: BP cuff, EKG monitors applied. Sedation begun. Artery and nerve location verified with U/S and anesthetic injected incrementally, slowly, and after negative aspirations under direct u/s guidance. Good fascial /perineural spread. Tolerated well.

## 2017-12-30 NOTE — Evaluation (Signed)
Physical Therapy Evaluation Patient Details Name: Amanda [name] MRN: [id number] DOB: May 25, 1951 Today's Date: 12/30/2017   History of Present Illness  Pt is a 67 y/o female s/p elective R TKA. PMH includes COPD, DM, HTN, and L TKA.   Clinical Impression  Pt is s/p surgery above with deficits below. Mobility limited to chair as pt unsteady and sleepy from medications. HEP initiated. Will continue to follow acutely to maximize functional mobility independence and safety.     Follow Up Recommendations SNF;Supervision/Assistance - 24 hour    Equipment Recommendations  None recommended by PT    Recommendations for Other Services       Precautions / Restrictions Precautions Precautions: Knee Precaution Booklet Issued: Yes (comment) Precaution Comments: REviewed supine ther ex with pt.  Required Braces or Orthoses: Knee Immobilizer - Right Knee Immobilizer - Right: Other (comment)(until discontinued ) Restrictions Weight Bearing Restrictions: Yes RLE Weight Bearing: Weight bearing as tolerated      Mobility  Bed Mobility               General bed mobility comments: On BSC upon entry   Transfers Overall transfer level: Needs assistance Equipment used: Rolling walker (2 wheeled) Transfers: Risk manager;Sit to/from Stand Sit to Stand: Min assist Stand pivot transfers: Min assist       General transfer comment: Min A for lift assist and steadying. REquired assist with cleanup following void. Min A for steadying throughout stand pivot transfer to recliner. Cues for sequencing using RW.   Ambulation/Gait         Gait velocity: NT      Stairs            Wheelchair Mobility    Modified Rankin (Stroke Patients Only)       Balance Overall balance assessment: Needs assistance Sitting-balance support: No upper extremity supported;Feet supported Sitting balance-Leahy Scale: Fair     Standing balance support: Bilateral upper extremity  supported;During functional activity Standing balance-Leahy Scale: Poor Standing balance comment: Reliant on BUE support and external support for steadying.                              Pertinent Vitals/Pain Pain Assessment: 0-10 Pain Score: 2  Pain Location: R knee  Pain Descriptors / Indicators: Aching;Operative site guarding Pain Intervention(s): Limited activity within patient's tolerance;Monitored during session;Repositioned    Home Living Family/patient expects to be discharged to:: Skilled nursing facility                 Additional Comments: Reports she would like to go to [facility]    Prior Function Level of Independence: Independent with assistive device(s)         Comments: Used cane for ambulation      Hand Dominance        Extremity/Trunk Assessment   Upper Extremity Assessment Upper Extremity Assessment: Defer to OT evaluation    Lower Extremity Assessment Lower Extremity Assessment: RLE deficits/detail RLE Deficits / Details: Reports decreased sensation. Able to perfrom ther ex below. Deficits consistent with post op pain and weakness.        Communication   Communication: No difficulties  Cognition Arousal/Alertness: Suspect due to medications Behavior During Therapy: WFL for tasks assessed/performed Overall Cognitive Status: Within Functional Limits for tasks assessed  General Comments: Sleepy throughout. Suspect because of medications.       General Comments General comments (skin integrity, edema, etc.): Pt's son present at end of session.     Exercises Total Joint Exercises Ankle Circles/Pumps: AROM;Both;20 reps Quad Sets: AROM;Right;10 reps(cues for technique ) Towel Squeeze: AROM;Both;10 reps(cues for technique )   Assessment/Plan    PT Assessment Patient needs continued PT services  PT Problem List Decreased strength;Decreased range of motion;Decreased  balance;Decreased mobility;Decreased knowledge of use of DME;Decreased knowledge of precautions;Pain;Impaired sensation       PT Treatment Interventions DME instruction;Gait training;Stair training;Functional mobility training;Therapeutic activities;Balance training;Therapeutic exercise;Neuromuscular re-education;Patient/family education    PT Goals (Current goals can be found in the Care Plan section)  Acute Rehab PT Goals Patient Stated Goal: to go to [facility] for rehab  PT Goal Formulation: With patient Time For Goal Achievement: 01/13/18 Potential to Achieve Goals: Fair    Frequency 7X/week   Barriers to discharge        Co-evaluation               AM-PAC PT "6 Clicks" Daily Activity  Outcome Measure Difficulty turning over in bed (including adjusting bedclothes, sheets and blankets)?: A Little Difficulty moving from lying on back to sitting on the side of the bed? : Unable Difficulty sitting down on and standing up from a chair with arms (e.g., wheelchair, bedside commode, etc,.)?: Unable Help needed moving to and from a bed to chair (including a wheelchair)?: A Little Help needed walking in hospital room?: A Lot Help needed climbing 3-5 steps with a railing? : A Lot 6 Click Score: 12    End of Session   Activity Tolerance: Patient tolerated treatment well Patient left: in chair;with call bell/phone within reach;with family/visitor present Nurse Communication: Mobility status PT Visit Diagnosis: Unsteadiness on feet (R26.81);Other abnormalities of gait and mobility (R26.89);Pain Pain - Right/Left: Right Pain - part of body: Knee    Time: 1715-1739 PT Time Calculation (min) (ACUTE ONLY): 24 min   Charges:   PT Evaluation $PT Eval Low Complexity: 1 Low PT Treatments $Therapeutic Activity: 8-22 mins   PT G Codes:        Leighton Ruff, PT, DPT  Acute Rehabilitation Services  Pager: [phone]   Rudean Hitt 12/30/2017, 5:57 PM

## 2017-12-30 NOTE — Anesthesia Procedure Notes (Signed)
Procedure Name: LMA Insertion Date/Time: 12/30/2017 7:42 AM Performed by: Glynda Jaeger, CRNA Pre-anesthesia Checklist: Patient identified, Emergency Drugs available, Suction available and Patient being monitored Patient Re-evaluated:Patient Re-evaluated prior to induction Oxygen Delivery Method: Circle System Utilized Preoxygenation: Pre-oxygenation with 100% oxygen Induction Type: IV induction Ventilation: Mask ventilation without difficulty LMA: LMA inserted LMA Size: 4.0 Number of attempts: 1 Airway Equipment and Method: Bite block Placement Confirmation: positive ETCO2 Tube secured with: Tape Dental Injury: Teeth and Oropharynx as per pre-operative assessment

## 2017-12-30 NOTE — Anesthesia Postprocedure Evaluation (Signed)
Anesthesia Post Note  Patient: Amanda [name]  Procedure(s) Performed: RIGHT TOTAL KNEE ARTHROPLASTY (Right )     Patient location during evaluation: PACU Anesthesia Type: Regional and General Level of consciousness: sedated and patient cooperative Pain management: pain level controlled Vital Signs Assessment: post-procedure vital signs reviewed and stable Respiratory status: spontaneous breathing Cardiovascular status: stable Anesthetic complications: no    Last Vitals:  Vitals:   12/30/17 1415 12/30/17 1447  BP: (!) 116/55 (!) 112/91  Pulse: 78 77  Resp: 18 18  Temp:  37 C  SpO2:  94%    Last Pain:  Vitals:   12/30/17 1447  TempSrc: Oral  PainSc:                  Nolon Nations

## 2017-12-30 NOTE — Progress Notes (Signed)
Orthopedic Tech Progress Note Patient Details:  Amanda [name] 1951/04/29 [id number]  CPM Right Knee CPM Right Knee: On Right Knee Flexion (Degrees): 40 Right Knee Extension (Degrees): 0 Additional Comments: trapeze bar patient helper  Post Interventions Patient Tolerated: Well Instructions Provided: Care of device  Hildred Priest 12/30/2017, 11:19 AM Viewed order from doctor's order list

## 2017-12-30 NOTE — Transfer of Care (Signed)
Immediate Anesthesia Transfer of Care Note  Patient: Amanda [name]  Procedure(s) Performed: RIGHT TOTAL KNEE ARTHROPLASTY (Right )  Patient Location: PACU  Anesthesia Type:General  Level of Consciousness: awake, patient cooperative and responds to stimulation  Airway & Oxygen Therapy: Patient Spontanous Breathing and Patient connected to face mask oxygen  Post-op Assessment: Report given to RN, Post -op Vital signs reviewed and stable and Patient moving all extremities X 4  Post vital signs: Reviewed and stable  Last Vitals:  Vitals:   12/30/17 0603 12/30/17 0617  BP: (!) 104/34 119/80  Pulse:    Resp:    Temp:    SpO2:      Last Pain:  Vitals:   12/30/17 0628  TempSrc:   PainSc: 4          Complications: No apparent anesthesia complications

## 2017-12-30 NOTE — H&P (Signed)
TOTAL KNEE ADMISSION H&P  Patient is being admitted for right total knee arthroplasty.  Subjective:  Chief Complaint:right knee pain.  HPI: Amanda [name], 67 y.o. female, has a history of pain and functional disability in the right knee due to arthritis and has failed non-surgical conservative treatments for greater than 12 weeks to includecorticosteriod injections, use of assistive devices and activity modification.  Onset of symptoms was gradual, starting >10 years ago with gradually worsening course since that time. The patient noted no past surgery on the right knee(s).  Patient currently rates pain in the right knee(s) at 9 out of 10 with activity. Patient has night pain, worsening of pain with activity and weight bearing, pain that interferes with activities of daily living, pain with passive range of motion, crepitus and joint swelling.  Patient has evidence of periarticular osteophytes and joint space narrowing by imaging studies. This patient has had A good result with her other knee replacement. There is no active infection.  Patient Active Problem List   Diagnosis Date Noted  . 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 sting   . COPD (chronic obstructive pulmonary disease) ([place]) 10/30/2016  . Gastroesophageal reflux disease 09/17/2016  . Degenerative arthritis of left knee 12/24/2015  . Memory loss 10/29/2015  . Primary osteoarthritis of right knee 06/16/2015  . Smoker 05/10/2015  . Solitary pulmonary nodule 03/28/2013  . Hypertension   . UNSPECIFIED VITAMIN D DEFICIENCY 05/29/2009  . Pure hypercholesterolemia 12/07/2007  . Major depressive disorder, recurrent episode, in partial remission ([place]) 12/07/2007  . Diabetes mellitus type 2, controlled ([place]) 08/10/2007   Past Medical History:  Diagnosis Date  . 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 sting    bee stings  . Anxiety   . Aortic stenosis    a. 2013: nl LV sys fxn, mild MR, no evidence of pulm htn; b. TTE 8/17: EF 60-65%, no RWMA, nl LV dia fxn,  mild AS, mod AI  .  COPD (chronic obstructive pulmonary disease) ([place]) 10/30/2016  . Degenerative arthritis of knee, bilateral 06/16/2015  . Depression   . Diabetes ([place])    diet managed-type 11  . GERD (gastroesophageal reflux disease)   . Hiatal hernia with gastroesophageal reflux 1997  . Hyperlipidemia   . Hypertension   . Neuromuscular disorder ([place])    nerve damage in back and shoulder  . Osteoporosis   . Palpitations    a. 03/2012: 48-hour Holter monitor showed intermittent sinus tachycardia with occasional PACs, without other significant arrhythmia  . Thyroid disease    hyperthyroidism as a teenager - received some injections age 52 that corrected this   . Tobacco abuse     Past Surgical History:  Procedure Laterality Date  . ABDOMINAL HYSTERECTOMY  1993  . BACK SURGERY    . BREAST BIOPSY Right 2007  . CHOLECYSTECTOMY    . COLONOSCOPY     30 years ago was normal per pt.   . ESOPHAGEAL DILATION     x 3  . fractured leg Right   . OVARIAN CYST REMOVAL  1972  . SALIVARY GLAND SURGERY    . TONSILLECTOMY     4 yoa  . TOTAL KNEE ARTHROPLASTY Left 12/24/2015   Procedure: LEFT TOTAL KNEE ARTHROPLASTY;  Surgeon: [name], MD;  Location: [facility];  Service: Orthopedics;  Laterality: Left;  . WRIST FRACTURE SURGERY  11/2008    Current Facility-Administered Medications  Medication Dose Route Frequency Provider Last Rate Last Dose  . ceFAZolin (ANCEF) IVPB 2g/100 mL premix  2 g  Intravenous On Call to [place], Tonna Corner, MD      . chlorhexidine (HIBICLENS) 4 % liquid 4 application  60 mL Topical Once [name], MD      . chlorhexidine (HIBICLENS) 4 % liquid 4 application  60 mL Topical Once [name], MD      . cloNIDine (DURACLON) 100 mcg/mL inj- OR Only  150 mcg Intra-articular Once Nolon Nations, MD      . tranexamic acid (CYKLOKAPRON) 1,000 mg in sodium chloride 0.9 % 100 mL IVPB  1,000 mg Intravenous To OR Marlou Sa Tonna Corner, MD       Facility-Administered  Medications Ordered in Other Encounters  Medication Dose Route Frequency Provider Last Rate Last Dose  . lactated ringers infusion    Continuous PRN Holtzman, Ariel [place]ef[place], CRNA      . ropivacaine (PF) 7.5 mg/mL (0.75%) (NAROPIN) injection    Anesthesia Intra-op Nolon Nations, MD   20 mL at 12/30/17 0709   Allergies  Allergen Reactions  . Diazepam Other (See Comments)    REACTION: pt states she gets very angry and abusive verbally on this medication     Social History   Tobacco Use  . Smoking status: Current Every Day Smoker    Packs/day: 0.75    Years: 45.00    Pack years: 33.75    Types: Cigarettes  . Smokeless tobacco: Never Used  . Tobacco comment: trying to quit before knee surgery  Substance Use Topics  . Alcohol use: Yes    Alcohol/week: 0.0 oz    Comment: glass of wine twice a month     Family History  Problem Relation Age of Onset  . Hyperlipidemia Mother   . Arthritis Mother   . Diabetes Mother   . Heart disease Mother   . Colon polyps Mother   . Cancer Paternal Grandmother        breast  . Hypercholesterolemia Sister   . Colon polyps Sister   . Breast cancer Sister   . Colon cancer Neg Hx   . Esophageal cancer Neg Hx   . Rectal cancer Neg Hx   . Stomach cancer Neg Hx   . Pancreatic cancer Neg Hx      Review of Systems  Musculoskeletal: Positive for joint pain.  All other systems reviewed and are negative.   Objective:  Physical Exam  Constitutional: She appears well-developed.  HENT:  Head: Normocephalic.  Eyes: Pupils are equal, round, and reactive to light.  Neck: Normal range of motion.  Cardiovascular: Normal rate.  Respiratory: Effort normal.  Neurological: She is alert.  Skin: Skin is warm.  Psychiatric: She has a normal mood and affect.  Examination of the right knee demonstrates intact skin.  Palpable pedal pulses.  Slight valgus alignment.  Mild effusion.  Intact extensor mechanism.  Flexion is about 5 to past 90 degrees.  Vital  signs in last 24 hours: Temp:  [98.4 F (36.9 C)] 98.4 F (36.9 C) (01/24 0558) Pulse Rate:  [73] 73 (01/24 0558) Resp:  [20] 20 (01/24 0558) BP: (97-119)/(34-80) 119/80 (01/24 0617) SpO2:  [96 %] 96 % (01/24 0558)  Labs:   Estimated body mass index is 32.71 kg/m as calculated from the following:   Height as of 12/20/17: 5\' 8"  (1.727 m).   Weight as of 12/20/17: 215 lb 1.6 oz (97.6 kg).   Imaging Review Plain radiographs demonstrate severe degenerative joint disease of the right knee(s). The overall alignment ismild valgus. The bone quality  appears to be good for age and reported activity level.  Assessment/Plan:  End stage arthritis, right knee   The patient history, physical examination, clinical judgment of the provider and imaging studies are consistent with end stage degenerative joint disease of the right knee(s) and total knee arthroplasty is deemed medically necessary. The treatment options including medical management, injection therapy arthroscopy and arthroplasty were discussed at length. The risks and benefits of total knee arthroplasty were presented and reviewed. The risks due to aseptic loosening, infection, stiffness, patella tracking problems, thromboembolic complications and other imponderables were discussed. The patient acknowledged the explanation, agreed to proceed with the plan and consent was signed. Patient is being admitted for inpatient treatment for surgery, pain control, PT, OT, prophylactic antibiotics, VTE prophylaxis, progressive ambulation and ADL's and discharge planning. The patient is planning to be discharged to skilled nursing facility

## 2017-12-31 ENCOUNTER — Encounter (HOSPITAL_COMMUNITY): Payer: Self-pay | Admitting: Orthopedic Surgery

## 2017-12-31 ENCOUNTER — Telehealth (INDEPENDENT_AMBULATORY_CARE_PROVIDER_SITE_OTHER): Payer: Self-pay | Admitting: Radiology

## 2017-12-31 LAB — GLUCOSE, CAPILLARY: Glucose-Capillary: 114 mg/dL — ABNORMAL HIGH (ref 65–99)

## 2017-12-31 MED ORDER — RIVAROXABAN 10 MG PO TABS: 10.0000 mg | ORAL_TABLET | Freq: Every day | ORAL | 0 refills | Status: DC

## 2017-12-31 NOTE — Telephone Encounter (Signed)
Mardene Celeste CSW at hospital calling about patient. She is suppose to be discharging tomorrow, she has insurance bundle plan. And this requires 3 day stay after total joint replacement. Patient was set to discharge tomorrow to [facility] in [place]. Dr. Marlou Sa made aware, patient will be discharged either tomorrow or Sunday. Mardene Celeste will let nursing know to follow up on this for the patient . cb [phone].

## 2017-12-31 NOTE — Social Work (Signed)
Pt received call from [place] at [facility] that Pt is a bundle and ortho office pre-arranged SNF placement with their facility. CSW advised SNF that patient desires [facility] which is closer to home.  CSW encouraged SNF to f/u with patient directly as the patient would consider [facility] if she could not get a bed at [facility]  CSW will f/u as CSW unaware that patient is bundle.  Elissa Hefty, LCSW Clinical Social Worker [phone]

## 2017-12-31 NOTE — Discharge Instructions (Addendum)
° ° °Keep knee incision dry for 5 days post op then may wet while bathing. °Therapy daily and CPM goal full extension and greater than 90 degrees flexion. °Call if fever or chills or increased drainage. °Go to ER if acutely short of breath or call for ambulance. °Return for follow up in 2 weeks. °May full weight bear on the surgical leg unless told otherwise. °Use knee immobilizer until able to straight leg raise off bed with knee stable. °In house walking for first 2 weeks. °INSTRUCTIONS AFTER JOINT REPLACEMENT  ° °o Remove items at home which could result in a fall. This includes throw rugs or furniture in walking pathways °o ICE to the affected joint every three hours while awake for 30 minutes at a time, for at least the first 3-5 days, and then as needed for pain and swelling.  Continue to use ice for pain and swelling. You may notice swelling that will progress down to the foot and ankle.  This is normal after surgery.  Elevate your leg when you are not up walking on it.   °o Continue to use the breathing machine you got in the hospital (incentive spirometer) which will help keep your temperature down.  It is common for your temperature to cycle up and down following surgery, especially at night when you are not up moving around and exerting yourself.  The breathing machine keeps your lungs expanded and your temperature down. ° ° °DIET:  As you were doing prior to hospitalization, we recommend a well-balanced diet. ° °DRESSING / WOUND CARE / SHOWERING ° °You may change your dressing 3-5 days after surgery.  Then change the dressing every day with sterile gauze.  Please use good hand washing techniques before changing the dressing.  Do not use any lotions or creams on the incision until instructed by your surgeon. ° °ACTIVITY ° °o Increase activity slowly as tolerated, but follow the weight bearing instructions below.   °o No driving for 6 weeks or until further direction given by your physician.  You cannot  drive while taking narcotics.  °o No lifting or carrying greater than 10 lbs. until further directed by your surgeon. °o Avoid periods of inactivity such as sitting longer than an hour when not asleep. This helps prevent blood clots.  °o You may return to work once you are authorized by your doctor.  ° ° ° °WEIGHT BEARING  ° °Weight bearing as tolerated with assist device (walker, cane, etc) as directed, use it as long as suggested by your surgeon or therapist, typically at least 4-6 weeks. ° ° °EXERCISES ° °Results after joint replacement surgery are often greatly improved when you follow the exercise, range of motion and muscle strengthening exercises prescribed by your doctor. Safety measures are also important to protect the joint from further injury. Any time any of these exercises cause you to have increased pain or swelling, decrease what you are doing until you are comfortable again and then slowly increase them. If you have problems or questions, call your caregiver or physical therapist for advice.  ° °Rehabilitation is important following a joint replacement. After just a few days of immobilization, the muscles of the leg can become weakened and shrink (atrophy).  These exercises are designed to build up the tone and strength of the thigh and leg muscles and to improve motion. Often times heat used for twenty to thirty minutes before working out will loosen up your tissues and help with improving the range   of motion but do not use heat for the first two weeks following surgery (sometimes heat can increase post-operative swelling).  ° °These exercises can be done on a training (exercise) mat, on the floor, on a table or on a bed. Use whatever works the best and is most comfortable for you.    Use music or television while you are exercising so that the exercises are a pleasant break in your day. This will make your life better with the exercises acting as a break in your routine that you can look forward  to.   Perform all exercises about fifteen times, three times per day or as directed.  You should exercise both the operative leg and the other leg as well. ° °Exercises include: °  °• Quad Sets - Tighten up the muscle on the front of the thigh (Quad) and hold for 5-10 seconds.   °• Straight Leg Raises - With your knee straight (if you were given a brace, keep it on), lift the leg to 60 degrees, hold for 3 seconds, and slowly lower the leg.  Perform this exercise against resistance later as your leg gets stronger.  °• Leg Slides: Lying on your back, slowly slide your foot toward your buttocks, bending your knee up off the floor (only go as far as is comfortable). Then slowly slide your foot back down until your leg is flat on the floor again.  °• Angel Wings: Lying on your back spread your legs to the side as far apart as you can without causing discomfort.  °• Hamstring Strength:  Lying on your back, push your heel against the floor with your leg straight by tightening up the muscles of your buttocks.  Repeat, but this time bend your knee to a comfortable angle, and push your heel against the floor.  You may put a pillow under the heel to make it more comfortable if necessary.  ° °A rehabilitation program following joint replacement surgery can speed recovery and prevent re-injury in the future due to weakened muscles. Contact your doctor or a physical therapist for more information on knee rehabilitation.  ° ° °CONSTIPATION ° °Constipation is defined medically as fewer than three stools per week and severe constipation as less than one stool per week.  Even if you have a regular bowel pattern at home, your normal regimen is likely to be disrupted due to multiple reasons following surgery.  Combination of anesthesia, postoperative narcotics, change in appetite and fluid intake all can affect your bowels.  ° °YOU MUST use at least one of the following options; they are listed in order of increasing strength to get  the job done.  They are all available over the counter, and you may need to use some, POSSIBLY even all of these options:   ° °Drink plenty of fluids (prune juice may be helpful) and high fiber foods °Colace 100 mg by mouth twice a day  °Senokot for constipation as directed and as needed Dulcolax (bisacodyl), take with full glass of water  °Miralax (polyethylene glycol) once or twice a day as needed. ° °If you have tried all these things and are unable to have a bowel movement in the first 3-4 days after surgery call either your surgeon or your primary doctor.   ° °If you experience loose stools or diarrhea, hold the medications until you stool forms back up.  If your symptoms do not get better within 1 week or if they get worse, check with your   doctor.  If you experience "the worst abdominal pain ever" or develop nausea or vomiting, please contact the office immediately for further recommendations for treatment.   ITCHING:  If you experience itching with your medications, try taking only a single pain pill, or even half a pain pill at a time.  You can also use Benadryl over the counter for itching or also to help with sleep.   TED HOSE STOCKINGS:  Use stockings on both legs until for at least 2 weeks or as directed by physician office. They may be removed at night for sleeping.  MEDICATIONS:  See your medication summary on the After Visit Summary that nursing will review with you.  You may have some home medications which will be placed on hold until you complete the course of blood thinner medication.  It is important for you to complete the blood thinner medication as prescribed.  PRECAUTIONS:  If you experience chest pain or shortness of breath - call 911 immediately for transfer to the hospital emergency department.   If you develop a fever greater that 101 F, purulent drainage from wound, increased redness or drainage from wound, foul odor from the wound/dressing, or calf pain - CONTACT YOUR  SURGEON.                                                   FOLLOW-UP APPOINTMENTS:  If you do not already have a post-op appointment, please call the office for an appointment to be seen by your surgeon.  Guidelines for how soon to be seen are listed in your After Visit Summary, but are typically between 1-4 weeks after surgery.  OTHER INSTRUCTIONS:   Knee Replacement:  Do not place pillow under knee, focus on keeping the knee straight while resting. CPM instructions: 0-90 degrees, 2 hours in the morning, 2 hours in the afternoon, and 2 hours in the evening. Place foam block, curve side up under heel at all times except when in CPM or when walking.  DO NOT modify, tear, cut, or change the foam block in any way.  MAKE SURE YOU:   Understand these instructions.   Get help right away if you are not doing well or get worse.    Thank you for letting us be a part of your medical care team.  It is a privilege we respect greatly.  We hope these instructions will help you stay on track for a fast and full recovery!   Information on my medicine - XARELTO (Rivaroxaban)  This medication education was reviewed with me or my healthcare representative as part of my discharge preparation.    Why was Xarelto prescribed for you? Xarelto was prescribed for you to reduce the risk of blood clots forming after orthopedic surgery. The medical term for these abnormal blood clots is venous thromboembolism (VTE).  What do you need to know about xarelto ? Take your Xarelto ONCE DAILY at the same time every day. You may take it either with or without food.  If you have difficulty swallowing the tablet whole, you may crush it and mix in applesauce just prior to taking your dose.  Take Xarelto exactly as prescribed by your doctor and DO NOT stop taking Xarelto without talking to the doctor who prescribed the medication.  Stopping without other VTE prevention medication to take the place  of Xarelto may  increase your risk of developing a clot.  After discharge, you should have regular check-up appointments with your healthcare provider that is prescribing your Xarelto.    What do you do if you miss a dose? If you miss a dose, take it as soon as you remember on the same day then continue your regularly scheduled once daily regimen the next day. Do not take two doses of Xarelto on the same day.   Important Safety Information A possible side effect of Xarelto is bleeding. You should call your healthcare provider right away if you experience any of the following: ? Bleeding from an injury or your nose that does not stop. ? Unusual colored urine (red or dark brown) or unusual colored stools (red or black). ? Unusual bruising for unknown reasons. ? A serious fall or if you hit your head (even if there is no bleeding).  Some medicines may interact with Xarelto and might increase your risk of bleeding while on Xarelto. To help avoid this, consult your healthcare provider or pharmacist prior to using any new prescription or non-prescription medications, including herbals, vitamins, non-steroidal anti-inflammatory drugs (NSAIDs) and supplements.  This website has more information on Xarelto: [email or web address]

## 2017-12-31 NOTE — Progress Notes (Signed)
Subjective: Patient stable.  Pain reasonably well controlled.  She has been walking in the hallway today   Objective: Vital signs in last 24 hours: Temp:  [97.9 F (36.6 C)-99.9 F (37.7 C)] 99.9 F (37.7 C) (01/25 1309) Pulse Rate:  [72-83] 83 (01/25 1309) Resp:  [18] 18 (01/25 0453) BP: (112-147)/(48-91) 147/49 (01/25 1309) SpO2:  [94 %-100 %] 98 % (01/25 1309)  Intake/Output from previous day: 01/24 0701 - 01/25 0700 In: 3483.8 [P.O.:1440; I.V.:2043.8] Out: 50 [Blood:50] Intake/Output this shift: Total I/O In: 120 [P.O.:120] Out: -   Exam:  Sensation intact distally Intact pulses distally Compartment soft  Labs: No results for input(s): HGB in the last 72 hours. No results for input(s): WBC, RBC, HCT, PLT in the last 72 hours. No results for input(s): NA, K, CL, CO2, BUN, CREATININE, GLUCOSE, CALCIUM in the last 72 hours. No results for input(s): LABPT, INR in the last 72 hours.  Assessment/Plan: Plan at this time is for her to go to skilled nursing tomorrow.  Medically she is doing well.  Continue with CPM weightbearing as tolerated in physical therapy.  Narcotic prescription on chart.   Landry Dyke Saraih Lorton 12/31/2017, 2:46 PM

## 2017-12-31 NOTE — Op Note (Signed)
NAMESTARLENA, Amanda [name]              ACCOUNT NO.:  [id number]  MEDICAL RECORD NO.:  [id number]  LOCATION:                                 FACILITY:  PHYSICIAN:  Anderson Malta, M.D.         DATE OF BIRTH:  DATE OF PROCEDURE:  12/30/2017 DATE OF DISCHARGE:                              OPERATIVE REPORT   PREOPERATIVE DIAGNOSIS:  Right knee arthritis.  POSTOPERATIVE DIAGNOSIS:  Right knee arthritis.  PROCEDURE:  Right total knee replacement using cemented cruciate retaining Stryker 4 femur, 4 tibia, 9-mm deep dish poly insert, 32-mm 3- peg cemented patella.  SURGEON:  Anderson Malta, MD  ASSISTANT:  Laure Kidney, RNFA.  INDICATIONS:  Amanda [name] is a 67 year old female with right knee pain and arthritis.  She presents now for operative management after explanation of risks and benefits.  She has failed conservative measures.  DESCRIPTION OF PROCEDURE:  The patient was brought to the operating room where general anesthetic was induced.  Preoperative antibiotics administered.  Time-out was called.  Right leg was prescrubbed with alcohol and Betadine and allowed to air dry, prepped with DuraPrep solution and draped in a sterile manner.  Charlie Pitter was used to cover the operative field.  Leg was elevated, exsanguinated the Esmarch wrap. Tourniquet was inflated, 300 mmHg inflation pressure.  Anterior approach to the knee was made.  Skin and subcutaneous tissue were sharply divided.  Median parapatellar approach was made and marked with a #1 Vicryl suture.  Patella was everted.  Severe arthritis was present, most severe on the lateral side.  Fat pad was partially excised.  Tissue removed from the anterior distal femur.  Lateral patellofemoral ligament was released.  Osteophytes were removed with a rongeur.  ACL was released, but the PCL was intact and spared.  At this time with the posterior neurovascular structures and collateral ligaments protected, the tibia was cut perpendicular to the  mechanical axis 9 mm off the least affected medial side, which did give about a 1 to 2-mm cut off the most affected lateral tibial plateau.  A 10-mm cut was then made off the distal femur.  This was exactly as she had on the left knee.  This allowed for a 9-mm spacer to be placed.  Good alignment and collateral ligament stability were present in extension.  The Chamfer cuts were then made in appropriate external rotation, which was slightly more than normal due to the deficiency on the lateral femoral condyle.  At this time, the anterior, posterior, and chamfer cuts were made.  The tibia was then keel punched.  Trial components placed.  Patella then cut down from 22 to 12 mm and a 3 pegged trial patella was placed.  At this time, the patella had excellent tracking and the knee had full extension, excellent flexion with good stability to varus and valgus stress at 0, 30, and 90 degrees.  Trial components were removed.  Thorough irrigation was performed with 3 liters of irrigating solution followed by Exparel injection into the capsule followed by tranexamic acid topical for 3 minutes.  Once this was removed, components were cemented into position with excess cement removed.  Same stability parameters  were maintained. Cement was allowed to harden.  The tourniquet was released.  Bleeding points encountered and controlled using electrocautery.  Thorough irrigation again performed with pouring irrigation and then the arthrotomy was then closed using #1 Vicryl suture, 0 Vicryl suture, 2-0 Vicryl suture, and 3-0 Monocryl.  Skin edges anesthetized using Marcaine, morphine, clonidine. At this time, Aquacel dressing placed with the knee in flexion and Ace wrap placed along with a knee immobilizer.  The patient tolerated the procedure well without immediate complications and was transferred to the recovery room in stable condition.     Anderson Malta, M.D.   ______________________________ Darnell Level.  Alphonzo Severance, M.D.    GSD/MEDQ  D:  12/30/2017  T:  12/31/2017  Job:  [id number]

## 2017-12-31 NOTE — Progress Notes (Signed)
OT Defer  Note  Patient Details Name: Amanda [name] MRN: [id number] DOB: 10-Jun-1951   Pt pending d/c to SNF at this time and will defer to SNF level care Cancelled Treatment:    Reason Eval/Treat Not Completed: OT screened, no needs identified, will sign off  Peri Maris 12/31/2017, 3:20 PM

## 2017-12-31 NOTE — NC FL2 (Signed)
[place] MEDICAID FL2 LEVEL OF CARE SCREENING TOOL     IDENTIFICATION  Patient Name: Amanda [name] Birthdate: May 08, 1951 Sex: female Admission Date (Current Location): 12/30/2017  [facility] and [place] Number:  Herbalist and Address:  [facility], [place] [address], [place], [place] [address]      Provider Number: [id number]  Attending Physician Name and Address:  [name], MD  Relative Name and Phone Number:  Seena Ritacco, son, [phone]    Current Level of Care: Hospital Recommended Level of Care: [place] Prior Approval Number:    Date Approved/Denied:   PASRR Number: [id number]  Discharge Plan: SNF    Current Diagnoses: Patient Active Problem List   Diagnosis Date Noted  . Arthritis of knee 12/30/2017  . 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 sting   . COPD (chronic obstructive pulmonary disease) ([place]) 10/30/2016  . Gastroesophageal reflux disease 09/17/2016  . Degenerative arthritis of left knee 12/24/2015  . Memory loss 10/29/2015  . Primary osteoarthritis of right knee 06/16/2015  . Smoker 05/10/2015  . Solitary pulmonary nodule 03/28/2013  . Hypertension   . UNSPECIFIED VITAMIN D DEFICIENCY 05/29/2009  . Pure hypercholesterolemia 12/07/2007  . Major depressive disorder, recurrent episode, in partial remission ([place]) 12/07/2007  . Diabetes mellitus type 2, controlled ([place]) 08/10/2007    Orientation RESPIRATION BLADDER Height & Weight     Self, Time, Situation, Place  Normal Continent Weight:   Height:     BEHAVIORAL SYMPTOMS/MOOD NEUROLOGICAL BOWEL NUTRITION STATUS      Continent Diet(See DC Summary)  AMBULATORY STATUS COMMUNICATION OF NEEDS Skin   Limited Assist Verbally Surgical wounds                       Personal Care Assistance Level of Assistance  Bathing, Feeding, Dressing Bathing Assistance: Limited assistance Feeding assistance: Independent Dressing Assistance: Limited assistance      Functional Limitations Info  Sight, Hearing, Speech Sight Info: Adequate Hearing Info: Adequate Speech Info: Adequate    SPECIAL CARE FACTORS FREQUENCY  PT (By licensed PT), OT (By licensed OT)     PT Frequency: 5x week OT Frequency: 5x week            Contractures      Additional Factors Info  Code Status, Allergies, Psychotropic Code Status Info: Full Allergies Info: DIAZEPAM  Psychotropic Info: wellbutrim, prozac         Current Medications (12/31/2017):  This is the current hospital active medication list Current Facility-Administered Medications  Medication Dose Route Frequency Provider Last Rate Last Dose  . acetaminophen (TYLENOL) tablet 650 mg  650 mg Oral Q4H PRN [name], MD       Or  . acetaminophen (TYLENOL) suppository 650 mg  650 mg Rectal Q4H PRN [name], MD      . albuterol (PROVENTIL) (2.5 MG/3ML) 0.083% nebulizer solution 3 mL  3 mL Inhalation Q4H PRN [name], MD      . amitriptyline (ELAVIL) tablet 10 mg  10 mg Oral QHS [name], MD   10 mg at 12/30/17 2138  . atorvastatin (LIPITOR) tablet 40 mg  40 mg Oral Daily [name], MD   40 mg at 12/31/17 1005  . buPROPion (WELLBUTRIN XL) 24 hr tablet 150 mg  150 mg Oral Daily [name], MD   150 mg at 12/31/17 1005  . cholecalciferol (VITAMIN D) tablet 1,000 Units  1,000 Units Oral  BID [name], MD   1,000 Units at 12/31/17 1005  . clonazePAM (KLONOPIN) tablet 0.5 mg  0.5 mg Oral BID PRN [name], MD      . cyclobenzaprine (FLEXERIL) tablet 10 mg  10 mg Oral TID PRN [name], MD   10 mg at 12/31/17 0236  . diphenhydrAMINE (BENADRYL) capsule 25 mg  25 mg Oral BID [name], MD   25 mg at 12/31/17 1004  . famotidine (PEPCID) tablet 20 mg  20 mg Oral Daily [name], MD   20 mg at 12/31/17 1005  . FLUoxetine (PROZAC) capsule 40 mg  40 mg Oral BID [name], MD   40 mg at 12/31/17 1005   . magnesium oxide (MAG-OX) tablet 400 mg  400 mg Oral Daily [name], MD   400 mg at 12/31/17 1004  . menthol-cetylpyridinium (CEPACOL) lozenge 3 mg  1 lozenge Oral PRN [name], MD       Or  . phenol (CHLORASEPTIC) mouth spray 1 spray  1 spray Mouth/Throat PRN [name], MD      . metoCLOPramide (REGLAN) tablet 5-10 mg  5-10 mg Oral Q8H PRN [name], MD       Or  . metoCLOPramide (REGLAN) injection 5-10 mg  5-10 mg Intravenous Q8H PRN [name], MD      . metoprolol tartrate (LOPRESSOR) tablet 12.5 mg  12.5 mg Oral BID [name], MD   12.5 mg at 12/31/17 1004  . morphine 2 MG/ML injection 2 mg  2 mg Intravenous Q2H PRN [name], MD   2 mg at 12/31/17 1233  . multivitamin with minerals tablet 1 tablet  1 tablet Oral Daily [name], MD   1 tablet at 12/31/17 1004  . ondansetron (ZOFRAN) tablet 4 mg  4 mg Oral Q6H PRN [name], MD       Or  . ondansetron [facility]) injection 4 mg  4 mg Intravenous Q6H PRN [name], MD      . oxyCODONE (Oxy IR/ROXICODONE) immediate release tablet 10 mg  10 mg Oral Q3H PRN [name], MD   10 mg at 12/31/17 1402  . polycarbophil (FIBERCON) tablet 625 mg  625 mg Oral Daily [name], MD   625 mg at 12/31/17 1004  . rivaroxaban (XARELTO) tablet 10 mg  10 mg Oral Q breakfast [name], MD   10 mg at 12/31/17 1005  . vitamin C (ASCORBIC ACID) tablet 1,000 mg  1,000 mg Oral Daily [name], MD   1,000 mg at 12/31/17 1004     Discharge Medications: Please see discharge summary for a list of discharge medications.  Relevant Imaging Results:  Relevant Lab Results:   Additional Information SS#: [id number]  [place], LCSW

## 2017-12-31 NOTE — Clinical Social Work Note (Signed)
Clinical Social Work Assessment  Patient Details  Name: Amanda [name] MRN: [id number] Date of Birth: Apr 27, 1951  Date of referral:  12/31/17               Reason for consult:  Facility Placement                Permission sought to share information with:  Facility Art therapist granted to share information::  Yes, Verbal Permission Granted  Name::        Agency::  SNF  Relationship::     Contact Information:     Housing/Transportation Living arrangements for the past 2 months:  Single Family Home Source of Information:  Patient Patient Interpreter Needed:  None Criminal Activity/Legal Involvement Pertinent to Current Situation/Hospitalization:  No - Comment as needed Significant Relationships:  Adult Children, Other Family Members Lives with:  Adult Children Do you feel safe going back to the place where you live?  No Need for family participation in patient care:  No (Coment)  Care giving concerns:  Pt from home with family and will need short term rehab at discharge. Patient in agreement and has been to SNF in the past when she had her left knee. Pt desires [facility] practitioner in [place] as first choice and [facility] as the second choice. CSW explained the SNF process and placement. Pt had no questions as she has experience with the process. CSW confirmed the transport to SNF and patient agreeable.   Social Worker assessment / plan:  CSW will send offers to SNF's and discuss with patient once offers provided.  CSW will then assist with disposition.  Employment status:  Retired Forensic scientist:  Medicare PT Recommendations:  [place] / Referral to community resources:  [place]  Patient/Family's Response to care:  Patient thanked CSW for following and advising about SNF and transport process. Pt agreeable with SNF at discharge.  Patient/Family's Understanding of and Emotional Response to Diagnosis, Current  Treatment, and Prognosis:  Patient has good understanding of impairment as this is her second knee surgery. Pt aware and agree with the recommendations for SNF and has an idea of where she desires to receive rehab at prior to discharge. Pt plans to return home once she has completed SNF.  Emotional Assessment Appearance:  Appears stated age Attitude/Demeanor/Rapport:  (Cooperative) Affect (typically observed):  Accepting, Appropriate Orientation:  Oriented to  Time, Oriented to Situation, Oriented to Place, Oriented to Self Alcohol / Substance use:  Not Applicable Psych involvement (Current and /or in the community):  No (Comment)  Discharge Needs  Concerns to be addressed:  Discharge Planning Concerns Readmission within the last 30 days:  No Current discharge risk:  Dependent with Mobility, Physical Impairment Barriers to Discharge:  No Barriers Identified   Normajean Baxter, LCSW 12/31/2017, 3:07 PM

## 2017-12-31 NOTE — Progress Notes (Signed)
Dc sun or mon

## 2017-12-31 NOTE — Discharge Summary (Signed)
Physician Discharge Summary  Patient ID: Amanda [name] MRN: [id number] DOB/AGE: 1951/03/23 67 y.o.  Admit date: 12/30/2017 Discharge date: 01/01/2018  Admission Diagnoses:  Active Problems:   Arthritis of knee   Discharge Diagnoses:  Same  Surgeries: Procedure(s): RIGHT TOTAL KNEE ARTHROPLASTY on 12/30/2017   Consultants:   Discharged Condition: Stable  Hospital Course: Amanda [name] is an 67 y.o. female who was admitted 12/30/2017 with a chief complaint of right knee pain, and found to have a diagnosis of right knee arthritis..  They were brought to the operating room on 12/30/2017 and underwent the above named procedures.  She tolerated the procedure well and was transferred to recovery room in stable condition.  She mobilized with physical therapy on postop day #1.  Pain controlled at that time.  She is discharged to skilled nursing facility on postop day #2 in good condition.  She will follow-up with me in approximately 10 days for clinical recheck.  Okay to be weightbearing as tolerated.  Patient is discharged on oxycodone for pain and Xarelto for DVT prophylaxis.Marland Kitchen  Antibiotics given:  Anti-infectives (From admission, onward)   Start     Dose/Rate Route Frequency Ordered Stop   12/30/17 1615  ceFAZolin (ANCEF) IVPB 2g/100 mL premix     2 g 200 mL/hr over 30 Minutes Intravenous Every 6 hours 12/30/17 1606 12/30/17 2208   12/30/17 0559  ceFAZolin (ANCEF) IVPB 2g/100 mL premix     2 g 200 mL/hr over 30 Minutes Intravenous On call to O.R. 12/30/17 0559 12/30/17 0744    .  Recent vital signs:  Vitals:   12/31/17 0453 12/31/17 1309  BP: (!) 143/68 (!) 147/49  Pulse: 77 83  Resp: 18   Temp: 98.6 F (37 C) 99.9 F (37.7 C)  SpO2: 97% 98%    Recent laboratory studies:  Results for orders placed or performed during the hospital encounter of 12/30/17  Glucose, capillary  Result Value Ref Range   Glucose-Capillary 107 (H) 65 - 99 mg/dL    Discharge Medications:    Allergies as of 12/31/2017      Reactions   Diazepam Other (See Comments)   REACTION: pt states she gets very angry and abusive verbally on this medication      Medication List    STOP taking these medications   aspirin 81 MG tablet   mupirocin ointment 2 % Commonly known as:  BACTROBAN   naproxen 250 MG tablet Commonly known as:  NAPROSYN     TAKE these medications   albuterol 108 (90 Base) MCG/ACT inhaler Commonly known as:  PROVENTIL HFA;VENTOLIN HFA Inhale 2 puffs into the lungs every 4 (four) hours as needed for wheezing or shortness of breath.   amitriptyline 50 MG tablet Commonly known as:  ELAVIL TAKE TWO TABLETS BY MOUTH AT NIGHT   atorvastatin 40 MG tablet Commonly known as:  LIPITOR TAKE ONE TABLET BY MOUTH ONCE DAILY   BENADRYL 25 mg capsule Generic drug:  diphenhydrAMINE Take 25 mg by mouth 2 (two) times daily.   buPROPion 150 MG 24 hr tablet Commonly known as:  WELLBUTRIN XL TAKE ONE TABLET BY MOUTH ONCE DAILY   clonazePAM 0.5 MG tablet Commonly known as:  KLONOPIN TAKE 1 TABLET BY MOUTH TWICE DAILY AS NEEDED   cyclobenzaprine 10 MG tablet Commonly known as:  FLEXERIL TAKE ONE TABLET BY MOUTH THREE TIMES DAILY AS NEEDED FOR MUSCLE SPASM   EQ FIBER THERAPY 625 MG tablet Generic drug:  polycarbophil Take 625  mg by mouth daily. FIBER THERAPY   FLUoxetine 40 MG capsule Commonly known as:  PROZAC Take 40 mg by mouth 2 (two) times daily.   magnesium oxide 400 MG tablet Commonly known as:  MAG-OX Take 400 mg by mouth daily.   metoprolol tartrate 25 MG tablet Commonly known as:  LOPRESSOR Take 0.5 tablets (12.5 mg total) by mouth 2 (two) times daily.   multivitamin tablet Take 1 tablet by mouth daily.   ranitidine 150 MG tablet Commonly known as:  ZANTAC Take 1 tablet (150 mg total) by mouth 2 (two) times daily.   rivaroxaban 10 MG Tabs tablet Commonly known as:  XARELTO Take 1 tablet (10 mg total) by mouth daily with breakfast. Start  taking on:  01/01/2018   vitamin C 1000 MG tablet Take 1,000 mg by mouth daily.   Vitamin D 1000 units capsule Take 1,000 Units by mouth 2 (two) times daily.       Diagnostic Studies: No results found.  Disposition: 03-Skilled Nursing Facility       Signed: Anderson Malta 12/31/2017, 2:49 PM

## 2017-12-31 NOTE — Social Work (Addendum)
Pt accepted SNF bed offer from [facility].  Pt will go to Room 330 and nurse will call report to [phone] over weekend.  Pt is a bundle patient and may need to meet medicare qualifying stay prior to discharging to SNF.  CSW discussed with FMMCRFVOH,KGOVP at [facility] to confirm bundle patient and she indicated that she would f/u.  4:13pm-Stephanie from ortho office called back and confirmed that patient is a bundle patient and would need to meet qualifying stay. Stephanie at ortho indicated that she may not be able to get in touch with doctor and patient may have to dc to SNF tomorrow based on dc summary.  CSW made Agricultural consultant and floor RN aware.  CSW will follow for discharge.  Elissa Hefty, LCSW Clinical Social Worker [phone]

## 2017-12-31 NOTE — Progress Notes (Signed)
Physical Therapy Treatment Patient Details Name: Amanda [name] MRN: [id number] DOB: June 30, 1951 Today's Date: 12/31/2017    History of Present Illness Pt is a 67 y/o female s/p elective R TKA. PMH includes COPD, DM, HTN, and L TKA.     PT Comments    Patient is making progress toward mobility goals. Continue to progress as tolerated with anticipated d/c to SNF for further skilled PT services.     Follow Up Recommendations  SNF;Supervision/Assistance - 24 hour     Equipment Recommendations  None recommended by PT    Recommendations for Other Services       Precautions / Restrictions Precautions Precautions: Knee Precaution Comments: precautions/positioning reviewed with pt Required Braces or Orthoses: Knee Immobilizer - Right Knee Immobilizer - Right: Other (comment)(until discontinued ) Restrictions Weight Bearing Restrictions: Yes RLE Weight Bearing: Weight bearing as tolerated    Mobility  Bed Mobility Overal bed mobility: Needs Assistance Bed Mobility: Supine to Sit     Supine to sit: Min guard     General bed mobility comments: min guard for safety; cues for sequencing; use of rail and HOB elevated  Transfers Overall transfer level: Needs assistance Equipment used: Rolling walker (2 wheeled) Transfers: Sit to/from Stand Sit to Stand: Min assist         General transfer comment: assist to power up into standing; cues for hand placement  Ambulation/Gait Ambulation/Gait assistance: Min assist Ambulation Distance (Feet): (64ft X2 with rest break) Assistive device: Rolling walker (2 wheeled) Gait Pattern/deviations: Step-through pattern;Decreased stance time - right;Decreased step length - left;Antalgic;Trunk flexed Gait velocity: decreased   General Gait Details: mildly antalgic gait pattern; cues for sequencing, safe use of AD, and posture; heavy reliance on UE support   Stairs            Wheelchair Mobility    Modified Rankin (Stroke  Patients Only)       Balance Overall balance assessment: Needs assistance Sitting-balance support: No upper extremity supported;Feet supported Sitting balance-Leahy Scale: Fair     Standing balance support: Bilateral upper extremity supported;During functional activity Standing balance-Leahy Scale: Poor                              Cognition Arousal/Alertness: Awake/alert Behavior During Therapy: WFL for tasks assessed/performed Overall Cognitive Status: Within Functional Limits for tasks assessed                                        Exercises Total Joint Exercises Ankle Circles/Pumps: AROM;Both;20 reps Quad Sets: AROM;Right;10 reps Short Arc QuadSinclair Ship;Right;10 reps Heel Slides: AAROM;Right;10 reps Hip ABduction/ADduction: AAROM;Right;10 reps    General Comments        Pertinent Vitals/Pain Pain Assessment: Faces Faces Pain Scale: Hurts even more Pain Location: R knee  Pain Descriptors / Indicators: Grimacing;Guarding;Sore Pain Intervention(s): Limited activity within patient's tolerance;Monitored during session;Premedicated before session;Repositioned    Home Living                      Prior Function            PT Goals (current goals can now be found in the care plan section) Acute Rehab PT Goals Patient Stated Goal: to go to [facility] for rehab  PT Goal Formulation: With patient Time For Goal Achievement: 01/13/18 Potential to Achieve Goals: Fair  Progress towards PT goals: Progressing toward goals    Frequency    7X/week      PT Plan Current plan remains appropriate    Co-evaluation              AM-PAC PT "6 Clicks" Daily Activity  Outcome Measure  Difficulty turning over in bed (including adjusting bedclothes, sheets and blankets)?: A Little Difficulty moving from lying on back to sitting on the side of the bed? : Unable Difficulty sitting down on and standing up from a chair with arms (e.g.,  wheelchair, bedside commode, etc,.)?: Unable Help needed moving to and from a bed to chair (including a wheelchair)?: A Little Help needed walking in hospital room?: A Little Help needed climbing 3-5 steps with a railing? : A Lot 6 Click Score: 13    End of Session Equipment Utilized During Treatment: Gait belt Activity Tolerance: Patient tolerated treatment well Patient left: in chair;with call bell/phone within reach Nurse Communication: Mobility status PT Visit Diagnosis: Unsteadiness on feet (R26.81);Other abnormalities of gait and mobility (R26.89);Pain Pain - Right/Left: Right Pain - part of body: Knee     Time: 7001-7494 PT Time Calculation (min) (ACUTE ONLY): 38 min  Charges:  $Gait Training: 8-22 mins $Therapeutic Exercise: 23-37 mins                    G Codes:       Earney Navy, PTA Pager: [phone]     Darliss Cheney 12/31/2017, 12:32 PM

## 2018-01-01 LAB — GLUCOSE, CAPILLARY: Glucose-Capillary: 123 mg/dL — ABNORMAL HIGH (ref 65–99)

## 2018-01-01 NOTE — Progress Notes (Signed)
Patient ID: Amanda [name], female   DOB: 1951/03/11, 67 y.o.   MRN: [id number] No acute changes.  Right knee dressing clean and dry.  Her right calf is soft and she moves her foot/ankle easily.  Can be discharged to skilled nursing closer to home tomorrow if approved thru her insurance.  Vitals stable.

## 2018-01-01 NOTE — Progress Notes (Signed)
     Subjective: 2 Days Post-Op Procedure(s) (LRB): RIGHT TOTAL KNEE ARTHROPLASTY (Right) Awake, alert and oriented x 4. Will be discharged to [facility] in [place]. Pain is mild.   Patient reports pain as mild.    Objective:   VITALS:  Temp:  [98.7 F (37.1 C)-101.2 F (38.4 C)] 99.5 F (37.5 C) (01/26 0737) Pulse Rate:  [81-93] 83 (01/26 1004) Resp:  [18-19] 18 (01/26 0737) BP: (107-147)/(49-76) 108/76 (01/26 1004) SpO2:  [92 %-98 %] 93 % (01/26 0737)  Neurologically intact ABD soft Neurovascular intact Sensation intact distally Intact pulses distally Dorsiflexion/Plantar flexion intact Incision: dressing C/D/I and no drainage   LABS No results for input(s): HGB, WBC, PLT in the last 72 hours. No results for input(s): NA, K, CL, CO2, BUN, CREATININE, GLUCOSE in the last 72 hours. No results for input(s): LABPT, INR in the last 72 hours.   Assessment/Plan: 2 Days Post-Op Procedure(s) (LRB): RIGHT TOTAL KNEE ARTHROPLASTY (Right)  Advance diet Up with therapy Discharge to [place] whenbed is available.  Basil Dess 01/01/2018, 10:22 AM Patient ID: Amanda [name], female   DOB: 1951-10-19, 67 y.o.   MRN: [id number]

## 2018-01-01 NOTE — Clinical Social Work Placement (Addendum)
   CLINICAL SOCIAL WORK PLACEMENT  NOTE  Date:  01/01/2018  Patient Details  Name: DUDLEY COOLEY MRN: [id number] Date of Birth: December 20, 1950  Clinical Social Work is seeking post-discharge placement for this patient at the [place] level of care (*CSW will initial, date and re-position this form in  chart as items are completed):  Yes   Patient/family provided with [facility] Work Department's list of facilities offering this level of care within the geographic area requested by the patient (or if unable, by the patient's family).  Yes   Patient/family informed of their freedom to choose among providers that offer the needed level of care, that participate in Medicare, Medicaid or managed care program needed by the patient, have an available bed and are willing to accept the patient.  Yes   Patient/family informed of [facility]'s ownership interest in [facility] and [facility], as well as of the fact that they are under no obligation to receive care at these facilities.  PASRR submitted to EDS on       PASRR number received on       Existing PASRR number confirmed on 12/31/17     FL2 transmitted to all facilities in geographic area requested by pt/family on       FL2 transmitted to all facilities within larger geographic area on 12/31/17     Patient informed that his/her managed care company has contracts with or will negotiate with certain facilities, including the following:        Yes   Patient/family informed of bed offers received.  Patient [facility] bed [facility]     Physician recommends and patient chooses bed at      Patient to be transferred to [facility] on 01/02/18.  Patient to be transferred to facility by [facility]     Patient family notified on 01/02/18 of transfer.  Name of family member notified:  pt responsible for self     PHYSICIAN       Additional Comment:     _______________________________________________ Eileen Stanford, LCSW 01/01/2018, 11:59 AM

## 2018-01-01 NOTE — Progress Notes (Signed)
Physical Therapy Treatment Patient Details Name: Amanda [name] MRN: [id number] DOB: [birth year] Today's Date: 01/01/2018    History of Present Illness Pt is a 67 y/o female s/p elective R TKA. PMH includes COPD, DM, HTN, and L TKA.     PT Comments    Today's skilled session focused on gait training and LE HEP. Pt fatigues quickly requiring 2 seated rest breaks. Pt with slow antalgic gait and heavily reliant on UE for support. D/c plan remains appropriate. Will continue to follow acutely.   Follow Up Recommendations  SNF;Supervision/Assistance - 24 hour     Equipment Recommendations  None recommended by PT    Recommendations for Other Services       Precautions / Restrictions Precautions Precautions: Knee Precaution Booklet Issued: Yes (comment) Precaution Comments: precautions/positioning reviewed with pt Required Braces or Orthoses: Knee Immobilizer - Right Knee Immobilizer - Right: Other (comment)(until discontinued ) Restrictions Weight Bearing Restrictions: Yes RLE Weight Bearing: Weight bearing as tolerated    Mobility  Bed Mobility Overal bed mobility: Needs Assistance Bed Mobility: Supine to Sit     Supine to sit: Min guard     General bed mobility comments: min guard for safety; cues for sequencing; use of rail and HOB elevated  Transfers Overall transfer level: Needs assistance Equipment used: Rolling walker (2 wheeled) Transfers: Sit to/from Stand Sit to Stand: Min assist         General transfer comment: Min A to power up into standing. Cues for hand placement  Ambulation/Gait Ambulation/Gait assistance: Min guard Ambulation Distance (Feet): 40 Feet(73ft x1; 43ft x2) Assistive device: Rolling walker (2 wheeled) Gait Pattern/deviations: Decreased stance time - right;Decreased step length - left;Antalgic;Trunk flexed;Step-to pattern Gait velocity: decreased Gait velocity interpretation: Below normal speed for age/gender General Gait Details:  mildly antalgic gait pattern; cues for sequencing, safe use of AD, and posture; heavy reliance on UE support. 2 rest breaks required secondary to UE fatigue.   Stairs            Wheelchair Mobility    Modified Rankin (Stroke Patients Only)       Balance Overall balance assessment: Needs assistance Sitting-balance support: No upper extremity supported;Feet supported Sitting balance-Leahy Scale: Fair     Standing balance support: Bilateral upper extremity supported;During functional activity Standing balance-Leahy Scale: Poor Standing balance comment: Heavily reliant on BIL UE support                            Cognition Arousal/Alertness: Awake/alert Behavior During Therapy: WFL for tasks assessed/performed Overall Cognitive Status: Within Functional Limits for tasks assessed                                        Exercises Total Joint Exercises Heel Slides: AROM;Right;10 reps;Seated Long Arc Quad: AROM;Right;10 reps;Seated Knee Flexion: AROM;Right;10 reps;Seated    General Comments        Pertinent Vitals/Pain Pain Assessment: Faces Faces Pain Scale: Hurts even more Pain Location: R knee  Pain Descriptors / Indicators: Grimacing;Guarding;Sore Pain Intervention(s): Monitored during session;Limited activity within patient's tolerance;Repositioned    Home Living                      Prior Function            PT Goals (current goals can now be found in the care  plan section) Acute Rehab PT Goals Patient Stated Goal: to go to [facility] for rehab  PT Goal Formulation: With patient Time For Goal Achievement: 01/13/18 Potential to Achieve Goals: Fair Progress towards PT goals: Progressing toward goals    Frequency    7X/week      PT Plan Current plan remains appropriate    Co-evaluation              AM-PAC PT "6 Clicks" Daily Activity  Outcome Measure  Difficulty turning over in bed (including  adjusting bedclothes, sheets and blankets)?: A Little Difficulty moving from lying on back to sitting on the side of the bed? : Unable Difficulty sitting down on and standing up from a chair with arms (e.g., wheelchair, bedside commode, etc,.)?: Unable Help needed moving to and from a bed to chair (including a wheelchair)?: A Little Help needed walking in hospital room?: A Little Help needed climbing 3-5 steps with a railing? : A Lot 6 Click Score: 13    End of Session Equipment Utilized During Treatment: Gait belt Activity Tolerance: Patient limited by pain;Patient limited by fatigue Patient left: in chair;with call bell/phone within reach Nurse Communication: Mobility status PT Visit Diagnosis: Unsteadiness on feet (R26.81);Other abnormalities of gait and mobility (R26.89);Pain Pain - Right/Left: Right Pain - part of body: Knee     Time: 1103-1594 PT Time Calculation (min) (ACUTE ONLY): 34 min  Charges:  $Gait Training: 8-22 mins $Therapeutic Exercise: 8-22 mins                    G Codes:       Benjiman Core, [place] Pager [phone] Acute Rehab    Allena Katz 01/01/2018, 12:24 PM

## 2018-01-01 NOTE — Clinical Social Work Note (Deleted)
Clinical Social Worker facilitated patient discharge including contacting patient family and facility to confirm patient discharge plans.  Clinical information faxed to facility and family agreeable with plan.  CSW arranged ambulance transport via [facility] to [facility].  RN to call [phone] for report prior to discharge.  Clinical Social Worker will sign off for now as social work intervention is no longer needed. Please consult [place] again if new need arises.  [place], [place]

## 2018-01-02 DIAGNOSIS — K210 Gastro-esophageal reflux disease with esophagitis: Secondary | ICD-10-CM | POA: Diagnosis not present

## 2018-01-02 DIAGNOSIS — R2689 Other abnormalities of gait and mobility: Secondary | ICD-10-CM | POA: Diagnosis not present

## 2018-01-02 DIAGNOSIS — F339 Major depressive disorder, recurrent, unspecified: Secondary | ICD-10-CM | POA: Diagnosis not present

## 2018-01-02 DIAGNOSIS — G8911 Acute pain due to trauma: Secondary | ICD-10-CM | POA: Diagnosis not present

## 2018-01-02 DIAGNOSIS — M6281 Muscle weakness (generalized): Secondary | ICD-10-CM | POA: Diagnosis not present

## 2018-01-02 DIAGNOSIS — S8990XA Unspecified injury of unspecified lower leg, initial encounter: Secondary | ICD-10-CM | POA: Diagnosis not present

## 2018-01-02 DIAGNOSIS — M1712 Unilateral primary osteoarthritis, left knee: Secondary | ICD-10-CM | POA: Diagnosis not present

## 2018-01-02 DIAGNOSIS — Z471 Aftercare following joint replacement surgery: Secondary | ICD-10-CM | POA: Diagnosis not present

## 2018-01-02 DIAGNOSIS — I10 Essential (primary) hypertension: Secondary | ICD-10-CM | POA: Diagnosis not present

## 2018-01-02 DIAGNOSIS — J439 Emphysema, unspecified: Secondary | ICD-10-CM | POA: Diagnosis not present

## 2018-01-02 MED ORDER — AMITRIPTYLINE HCL 50 MG PO TABS: ORAL_TABLET | ORAL | 3 refills | Status: DC

## 2018-01-02 NOTE — Discharge Summary (Signed)
Physician Discharge Summary      Patient ID: Amanda [name] MRN: [id number] DOB/AGE: 1951/06/06 67 y.o.  Admit date: 12/30/2017 Discharge date: 01/02/2018  Admission Diagnoses:  Active Problems:   Arthritis of knee   Discharge Diagnoses:  Same  Past Medical History:  Diagnosis Date  . 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 sting    bee stings  . Anxiety   . Aortic stenosis    a. 2013: nl LV sys fxn, mild MR, no evidence of pulm htn; b. TTE 8/17: EF 60-65%, no RWMA, nl LV dia fxn,  mild AS, mod AI  . COPD (chronic obstructive pulmonary disease) ([place]) 10/30/2016  . Degenerative arthritis of knee, bilateral 06/16/2015  . Depression   . Diabetes ([place])    diet managed-type 11  . GERD (gastroesophageal reflux disease)   . Hiatal hernia with gastroesophageal reflux 1997  . Hyperlipidemia   . Hypertension   . 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 Osteoporosis   . Palpitations    a. 03/2012: 48-hour Holter monitor showed intermittent sinus tachycardia with occasional PACs, without other significant arrhythmia  . Thyroid disease    hyperthyroidism as a teenager - received some injections age 9 that corrected this   . Tobacco abuse     Surgeries: Procedure(s): RIGHT TOTAL KNEE ARTHROPLASTY on 12/30/2017   Consultants:   Discharged Condition: Improved  Hospital Course: Amanda [name] is an 67 y.o. female who was admitted 12/30/2017 with a chief complaint of No chief complaint on file. , and found to have a diagnosis of <principal problem not specified>.  She was brought to the operating room on 12/30/2017 and underwent the above named procedures.    She was given perioperative antibiotics:  Anti-infectives (From admission, onward)   Start     Dose/Rate Route Frequency Ordered Stop   12/30/17 1615  ceFAZolin (ANCEF) IVPB 2g/100 mL premix     2 g 200 mL/hr over 30 Minutes Intravenous Every 6 hours 12/30/17 1606 12/30/17 2208   12/30/17 0559  ceFAZolin (ANCEF) IVPB  2g/100 mL premix     2 g 200 mL/hr over 30 Minutes Intravenous On call to O.R. 12/30/17 0559 12/30/17 0744    She recovered in the PACU uneventfully and was transferred to med-surg floor [facility] 5 Norht Bed 16. Her VSS and she was started on CPM. POD #1 PT and OT. Voiding without difficulty, dressing with minimal drainage. POD#2 awake, alert and oriented x 4. She tolerated PT and OT with gradual improvement in her standing and walking tolerance. [facility] for post op care was arranged. On POD#3 she was alert and oriented x 4. Her left knee dressing dry with 2 small scant areas of dried blood. Left intact. Her VSS, she was taking and tolerating po narcotics and nourishment. She was transferred to SNF on POD#3 and she was stable orthopaedically.    She was given sequential compression devices, early ambulation, and chemoprophylaxis for DVT prophylaxis.  She benefited maximally from their hospital stay and there were no complications.    Recent vital signs:  Vitals:   01/01/18 2025 01/02/18 0517  BP: (!) 119/48 (!) 132/50  Pulse: 84 77  Resp:    Temp: 98.8 F (37.1 C) 97.9 F (36.6 C)  SpO2: 99% 97%    Recent laboratory studies:  Results for orders placed or performed during the hospital encounter of 12/30/17  Glucose, capillary  Result Value Ref Range   Glucose-Capillary 107 (H)  65 - 99 mg/dL  Glucose, capillary  Result Value Ref Range   Glucose-Capillary 114 (H) 65 - 99 mg/dL  Glucose, capillary  Result Value Ref Range   Glucose-Capillary 123 (H) 65 - 99 mg/dL   Comment 1 Document in Chart     Discharge Medications:   Allergies as of 01/02/2018      Reactions   Diazepam Other (See Comments)   REACTION: pt states she gets very angry and abusive verbally on this medication      Medication List    STOP taking these medications   aspirin 81 MG tablet   mupirocin ointment 2 % Commonly known as:  BACTROBAN   naproxen 250 MG tablet Commonly known as:  NAPROSYN     TAKE  these medications   albuterol 108 (90 Base) MCG/ACT inhaler Commonly known as:  PROVENTIL HFA;VENTOLIN HFA Inhale 2 puffs into the lungs every 4 (four) hours as needed for wheezing or shortness of breath.   amitriptyline 50 MG tablet Commonly known as:  ELAVIL TAKE TWO TABLETS BY MOUTH AT NIGHT   atorvastatin 40 MG tablet Commonly known as:  LIPITOR TAKE ONE TABLET BY MOUTH ONCE DAILY   BENADRYL 25 mg capsule Generic drug:  diphenhydrAMINE Take 25 mg by mouth 2 (two) times daily.   buPROPion 150 MG 24 hr tablet Commonly known as:  WELLBUTRIN XL TAKE ONE TABLET BY MOUTH ONCE DAILY   clonazePAM 0.5 MG tablet Commonly known as:  KLONOPIN TAKE 1 TABLET BY MOUTH TWICE DAILY AS NEEDED   cyclobenzaprine 10 MG tablet Commonly known as:  FLEXERIL TAKE ONE TABLET BY MOUTH THREE TIMES DAILY AS NEEDED FOR MUSCLE SPASM   EQ FIBER THERAPY 625 MG tablet Generic drug:  polycarbophil Take 625 mg by mouth daily. FIBER THERAPY   FLUoxetine 40 MG capsule Commonly known as:  PROZAC Take 40 mg by mouth 2 (two) times daily.   magnesium oxide 400 MG tablet Commonly known as:  MAG-OX Take 400 mg by mouth daily.   metoprolol tartrate 25 MG tablet Commonly known as:  LOPRESSOR Take 0.5 tablets (12.5 mg total) by mouth 2 (two) times daily.   multivitamin tablet Take 1 tablet by mouth daily.   ranitidine 150 MG tablet Commonly known as:  ZANTAC Take 1 tablet (150 mg total) by mouth 2 (two) times daily.   rivaroxaban 10 MG Tabs tablet Commonly known as:  XARELTO Take 1 tablet (10 mg total) by mouth daily with breakfast.   vitamin C 1000 MG tablet Take 1,000 mg by mouth daily.   Vitamin D 1000 units capsule Take 1,000 Units by mouth 2 (two) times daily.       Diagnostic Studies: No results found.  Disposition: 03-Skilled Nursing Facility  Discharge Instructions    Call MD / Call 911   Complete by:  As directed    If you experience chest pain or shortness of breath, CALL 911  and be transported to the hospital emergency room.  If you develope a fever above 101 F, pus (white drainage) or increased drainage or redness at the wound, or calf pain, call your surgeon's office.   Call MD / Call 911   Complete by:  As directed    If you experience chest pain or shortness of breath, CALL 911 and be transported to the hospital emergency room.  If you develope a fever above 101 F, pus (white drainage) or increased drainage or redness at the wound, or calf pain, call your surgeon's  office.   Constipation Prevention   Complete by:  As directed    Drink plenty of fluids.  Prune juice may be helpful.  You may use a stool softener, such as Colace (over the counter) 100 mg twice a day.  Use MiraLax (over the counter) for constipation as needed.   Constipation Prevention   Complete by:  As directed    Drink plenty of fluids.  Prune juice may be helpful.  You may use a stool softener, such as Colace (over the counter) 100 mg twice a day.  Use MiraLax (over the counter) for constipation as needed.   Diet - low sodium heart healthy   Complete by:  As directed    Diet - low sodium heart healthy   Complete by:  As directed    Discharge instructions   Complete by:  As directed    Weightbearing as tolerated with crutches or walker. Okay to shower dressing is waterproof CPM machine 1-1/2-hour 3 times a day Work on full extension with the blue cradle boot Follow-up in 2 weeks   Discharge instructions   Complete by:  As directed    Keep knee incision dry for 5 days post op then may wet while bathing. Therapy daily and CPM goal full extension and greater than 90 degrees flexion. Call if fever or chills or increased drainage. Go to ER if acutely short of breath or call for ambulance. Return for follow up in 2 weeks. May full weight bear on the surgical leg unless told otherwise. Use knee immobilizer until able to straight leg raise off bed with knee stable. In house walking for first 2  weeks. INSTRUCTIONS AFTER JOINT REPLACEMENT   Remove items at home which could result in a fall. This includes throw rugs or furniture in walking pathways ICE to the affected joint every three hours while awake for 30 minutes at a time, for at least the first 3-5 days, and then as needed for pain and swelling.  Continue to use ice for pain and swelling. You may notice swelling that will progress down to the foot and ankle.  This is normal after surgery.  Elevate your leg when you are not up walking on it.   Continue to use the breathing machine you got in the hospital (incentive spirometer) which will help keep your temperature down.  It is common for your temperature to cycle up and down following surgery, especially at night when you are not up moving around and exerting yourself.  The breathing machine keeps your lungs expanded and your temperature down.   DIET:  As you were doing prior to hospitalization, we recommend a well-balanced diet.  DRESSING / WOUND CARE / SHOWERING  You may change your dressing 3-5 days after surgery.  Then change the dressing every day with sterile gauze.  Please use good hand washing techniques before changing the dressing.  Do not use any lotions or creams on the incision until instructed by your surgeon.  ACTIVITY  Increase activity slowly as tolerated, but follow the weight bearing instructions below.   No driving for 6 weeks or until further direction given by your physician.  You cannot drive while taking narcotics.  No lifting or carrying greater than 10 lbs. until further directed by your surgeon. Avoid periods of inactivity such as sitting longer than an hour when not asleep. This helps prevent blood clots.  You may return to work once you are authorized by your doctor.  WEIGHT BEARING   Weight bearing as tolerated with assist device (walker, cane, etc) as directed, use it as long as suggested by your surgeon or therapist, typically at least 4-6  weeks.   EXERCISES  Results after joint replacement surgery are often greatly improved when you follow the exercise, range of motion and muscle strengthening exercises prescribed by your doctor. Safety measures are also important to protect the joint from further injury. Any time any of these exercises cause you to have increased pain or swelling, decrease what you are doing until you are comfortable again and then slowly increase them. If you have problems or questions, call your caregiver or physical therapist for advice.   Rehabilitation is important following a joint replacement. After just a few days of immobilization, the muscles of the leg can become weakened and shrink (atrophy).  These exercises are designed to build up the tone and strength of the thigh and leg muscles and to improve motion. Often times heat used for twenty to thirty minutes before working out will loosen up your tissues and help with improving the range of motion but do not use heat for the first two weeks following surgery (sometimes heat can increase post-operative swelling).   These exercises can be done on a training (exercise) mat, on the floor, on a table or on a bed. Use whatever works the best and is most comfortable for you.    Use music or television while you are exercising so that the exercises are a pleasant break in your day. This will make your life better with the exercises acting as a break in your routine that you can look forward to.   Perform all exercises about fifteen times, three times per day or as directed.  You should exercise both the operative leg and the other leg as well.  Exercises include:   Quad Sets - Tighten up the muscle on the front of the thigh (Quad) and hold for 5-10 seconds.   Straight Leg Raises - With your knee straight (if you were given a brace, keep it on), lift the leg to 60 degrees, hold for 3 seconds, and slowly lower the leg.  Perform this exercise against resistance later as  your leg gets stronger.  Leg Slides: Lying on your back, slowly slide your foot toward your buttocks, bending your knee up off the floor (only go as far as is comfortable). Then slowly slide your foot back down until your leg is flat on the floor again.  Angel Wings: Lying on your back spread your legs to the side as far apart as you can without causing discomfort.  Hamstring Strength:  Lying on your back, push your heel against the floor with your leg straight by tightening up the muscles of your buttocks.  Repeat, but this time bend your knee to a comfortable angle, and push your heel against the floor.  You may put a pillow under the heel to make it more comfortable if necessary.   A rehabilitation program following joint replacement surgery can speed recovery and prevent re-injury in the future due to weakened muscles. Contact your doctor or a physical therapist for more information on knee rehabilitation.    CONSTIPATION  Constipation is defined medically as fewer than three stools per week and severe constipation as less than one stool per week.  Even if you have a regular bowel pattern at home, your normal regimen is likely to be disrupted due to multiple reasons following surgery.  Combination of anesthesia, postoperative narcotics, change in appetite and fluid intake all can affect your bowels.   YOU MUST use at least one of the following options; they are listed in order of increasing strength to get the job done.  They are all available over the counter, and you may need to use some, POSSIBLY even all of these options:    Drink plenty of fluids (prune juice may be helpful) and high fiber foods Colace 100 mg by mouth twice a day  Senokot for constipation as directed and as needed Dulcolax (bisacodyl), take with full glass of water  Miralax (polyethylene glycol) once or twice a day as needed.  If you have tried all these things and are unable to have a bowel movement in the first 3-4 days  after surgery call either your surgeon or your primary doctor.    If you experience loose stools or diarrhea, hold the medications until you stool forms back up.  If your symptoms do not get better within 1 week or if they get worse, check with your doctor.  If you experience "the worst abdominal pain ever" or develop nausea or vomiting, please contact the office immediately for further recommendations for treatment.   ITCHING:  If you experience itching with your medications, try taking only a single pain pill, or even half a pain pill at a time.  You can also use Benadryl over the counter for itching or also to help with sleep.   TED HOSE STOCKINGS:  Use stockings on both legs until for at least 2 weeks or as directed by physician office. They may be removed at night for sleeping.  MEDICATIONS:  See your medication summary on the "After Visit Summary" that nursing will review with you.  You may have some home medications which will be placed on hold until you complete the course of blood thinner medication.  It is important for you to complete the blood thinner medication as prescribed.  PRECAUTIONS:  If you experience chest pain or shortness of breath - call 911 immediately for transfer to the hospital emergency department.   If you develop a fever greater that 101 F, purulent drainage from wound, increased redness or drainage from wound, foul odor from the wound/dressing, or calf pain - CONTACT YOUR SURGEON.                                                   FOLLOW-UP APPOINTMENTS:  If you do not already have a post-op appointment, please call the office for an appointment to be seen by your surgeon.  Guidelines for how soon to be seen are listed in your "After Visit Summary", but are typically between 1-4 weeks after surgery.  OTHER INSTRUCTIONS:   Knee Replacement:  Do not place pillow under knee, focus on keeping the knee straight while resting. CPM instructions: 0-90 degrees, 2 hours in the  morning, 2 hours in the afternoon, and 2 hours in the evening. Place foam block, curve side up under heel at all times except when in CPM or when walking.  DO NOT modify, tear, cut, or change the foam block in any way.  MAKE SURE YOU:  Understand these instructions.  Get help right away if you are not doing well or get worse.    Thank you for letting us be a part  of your medical care team.  It is a privilege we respect greatly.  We hope these instructions will help you stay on track for a fast and full recovery!   Information on my medicine - XARELTO (Rivaroxaban)  This medication education was reviewed with me or my healthcare representative as part of my discharge preparation.    Why was Xarelto prescribed for you? Xarelto was prescribed for you to reduce the risk of blood clots forming after orthopedic surgery. The medical term for these abnormal blood clots is venous thromboembolism (VTE).  What do you need to know about xarelto ? Take your Xarelto ONCE DAILY at the same time every day. You may take it either with or without food.  If you have difficulty swallowing the tablet whole, you may crush it and mix in applesauce just prior to taking your dose.  Take Xarelto exactly as prescribed by your doctor and DO NOT stop taking Xarelto without talking to the doctor who prescribed the medication.  Stopping without other VTE prevention medication to take the place of Xarelto may increase your risk of developing a clot.  After discharge, you should have regular check-up appointments with your healthcare provider that is prescribing your Xarelto.    What do you do if you miss a dose? If you miss a dose, take it as soon as you remember on the same day then continue your regularly scheduled once daily regimen the next day. Do not take two doses of Xarelto on the same day.   Important Safety Information A possible side effect of Xarelto is bleeding. You should call your healthcare  provider right away if you experience any of the following: Bleeding from an injury or your nose that does not stop. Unusual colored urine (red or dark brown) or unusual colored stools (red or black). Unusual bruising for unknown reasons. A serious fall or if you hit your head (even if there is no bleeding).  Some medicines may interact with Xarelto and might increase your risk of bleeding while on Xarelto. To help avoid this, consult your healthcare provider or pharmacist prior to using any new prescription or non-prescription medications, including herbals, vitamins, non-steroidal anti-inflammatory drugs (NSAIDs) and supplements.  This website has more information on Xarelto: [email or web address]   Driving restrictions   Complete by:  As directed    No driving for 6 weeks   Increase activity slowly as tolerated   Complete by:  As directed    Increase activity slowly as tolerated   Complete by:  As directed    Lifting restrictions   Complete by:  As directed    No lifting for 8 weeks       Contact information for follow-up providers    Marlou Sa, Tonna Corner, MD Follow up in 2 week(s).   Specialty:  Orthopedic Surgery Contact information: [place] [address] [phone]            Contact information for after-discharge care    Destination    [facility] Follow up.   Service:  Skilled Nursing Contact information: [place] [employer] [place] [phone]                   Signed: Basil Dess 01/02/2018, 11:50 AM

## 2018-01-02 NOTE — Progress Notes (Signed)
     Subjective: 3 Days Post-Op Procedure(s) (LRB): RIGHT TOTAL KNEE ARTHROPLASTY (Right) Awake, alert and ready for discharge to [facility] today.   Patient reports pain as mild.    Objective:   VITALS:  Temp:  [97.9 F (36.6 C)-99.8 F (37.7 C)] 97.9 F (36.6 C) (01/27 0517) Pulse Rate:  [77-84] 77 (01/27 0517) Resp:  [18] 18 (01/26 1710) BP: (111-132)/(46-72) 132/50 (01/27 0517) SpO2:  [95 %-100 %] 97 % (01/27 0517) Weight:  [205 lb 11.2 oz (93.3 kg)] 205 lb 11.2 oz (93.3 kg) (01/26 1710)  Neurologically intact ABD soft Neurovascular intact Sensation intact distally Intact pulses distally Dorsiflexion/Plantar flexion intact Incision: dressing C/D/I and scant drainage No cellulitis present Compartment soft   LABS No results for input(s): HGB, WBC, PLT in the last 72 hours. No results for input(s): NA, K, CL, CO2, BUN, CREATININE, GLUCOSE in the last 72 hours. No results for input(s): LABPT, INR in the last 72 hours.   Assessment/Plan: 3 Days Post-Op Procedure(s) (LRB): RIGHT TOTAL KNEE ARTHROPLASTY (Right)  Advance diet Up with therapy Discharge to SNF  Basil Dess 01/02/2018, 11:43 AM Patient ID: Amanda [name], female   DOB: 1951-07-03, 67 y.o.   MRN: [id number]

## 2018-01-02 NOTE — Progress Notes (Signed)
Report called to [facility].

## 2018-01-02 NOTE — Clinical Social Work Note (Addendum)
Clinical Social Worker facilitated patient discharge including contacting patient family and facility to confirm patient discharge plans.  Clinical information faxed to facility and family agreeable with plan.  CSW arranged ambulance transport via [facility] to [facility].  RN to call [phone] for report prior to discharge.  Clinical Social Worker will sign off for now as social work intervention is no longer needed. Please consult [place] again if new need arises.  [employer], [place]

## 2018-01-02 NOTE — Progress Notes (Signed)
Physical Therapy Treatment Patient Details Name: Amanda [name] MRN: [id number] DOB: July 11, 1951 Today's Date: 01/02/2018    History of Present Illness Pt is a 67 y/o female s/p elective R TKA. PMH includes COPD, DM, HTN, and L TKA.     PT Comments    Patient reporting she is soon to be d/c to SNF/Rehab. Patient motivated to work with PT. Able to perform sit to stand from low bed with Min Guard for overall safety as well as VC for hand placement as patient prefers to pull up from RW - increasing her fall risk. Ambulating with step through pattern with pain and fatigue limiting ambulation distance. Educated on need for continued knee extension while supine with zero knee as well as continued compliance with HEP and gait training to maximize functional mobility with good verbal understanding.     Follow Up Recommendations  SNF;Supervision/Assistance - 24 hour     Equipment Recommendations  None recommended by PT    Recommendations for Other Services       Precautions / Restrictions Precautions Precautions: Knee Required Braces or Orthoses: Knee Immobilizer - Right Knee Immobilizer - Right: Other (comment)(until discontinued) Restrictions Weight Bearing Restrictions: Yes RLE Weight Bearing: Weight bearing as tolerated    Mobility  Bed Mobility                  Transfers Overall transfer level: Needs assistance Equipment used: Rolling walker (2 wheeled) Transfers: Sit to/from Stand Sit to Stand: Min guard         General transfer comment: VC for hand placement as patient tried to pull up on RW  Ambulation/Gait Ambulation/Gait assistance: Min guard Ambulation Distance (Feet): 75 Feet Assistive device: Rolling walker (2 wheeled) Gait Pattern/deviations: Step-to pattern;Decreased stride length;Decreased stance time - right;Decreased step length - left;Decreased weight shift to right;Antalgic Gait velocity: decreased Gait velocity interpretation: Below normal  speed for age/gender General Gait Details: VC for safety with RW - limited safety with obstacle navigation. Heavy UE support. 1 standing rest break due to fatigue   Stairs            Wheelchair Mobility    Modified Rankin (Stroke Patients Only)       Balance Overall balance assessment: Needs assistance Sitting-balance support: No upper extremity supported;Feet supported Sitting balance-Leahy Scale: Good     Standing balance support: Bilateral upper extremity supported;During functional activity Standing balance-Leahy Scale: Fair Standing balance comment: relaince on B UE on RW                            Cognition Arousal/Alertness: Awake/alert Behavior During Therapy: WFL for tasks assessed/performed Overall Cognitive Status: Within Functional Limits for tasks assessed                                        Exercises      General Comments        Pertinent Vitals/Pain Pain Assessment: Faces Faces Pain Scale: Hurts little more Pain Location: R knee  Pain Descriptors / Indicators: Discomfort;Grimacing;Guarding;Operative site guarding Pain Intervention(s): Limited activity within patient's tolerance;Monitored during session;Repositioned    Home Living                      Prior Function            PT Goals (current goals can  now be found in the care plan section) Acute Rehab PT Goals Patient Stated Goal: to go to [facility] for rehab  PT Goal Formulation: With patient Time For Goal Achievement: 01/13/18 Potential to Achieve Goals: Fair Progress towards PT goals: Progressing toward goals    Frequency    7X/week      PT Plan Current plan remains appropriate    Co-evaluation              AM-PAC PT "6 Clicks" Daily Activity  Outcome Measure  Difficulty turning over in bed (including adjusting bedclothes, sheets and blankets)?: A Little Difficulty moving from lying on back to sitting on the side of the  bed? : A Little Difficulty sitting down on and standing up from a chair with arms (e.g., wheelchair, bedside commode, etc,.)?: Unable Help needed moving to and from a bed to chair (including a wheelchair)?: A Little Help needed walking in hospital room?: A Little Help needed climbing 3-5 steps with a railing? : A Lot 6 Click Score: 15    End of Session Equipment Utilized During Treatment: Gait belt Activity Tolerance: Patient tolerated treatment well Patient left: in bed;with call bell/phone within reach Nurse Communication: Mobility status PT Visit Diagnosis: Unsteadiness on feet (R26.81);Other abnormalities of gait and mobility (R26.89);Pain Pain - Right/Left: Right Pain - part of body: Knee     Time: 1015-1027 PT Time Calculation (min) (ACUTE ONLY): 12 min  Charges:  $Gait Training: 8-22 mins                    G Codes:       Lanney Gins, PT, DPT 01/02/18 10:36 AM

## 2018-01-04 DIAGNOSIS — I10 Essential (primary) hypertension: Secondary | ICD-10-CM | POA: Diagnosis not present

## 2018-01-04 DIAGNOSIS — K210 Gastro-esophageal reflux disease with esophagitis: Secondary | ICD-10-CM | POA: Diagnosis not present

## 2018-01-04 DIAGNOSIS — M1712 Unilateral primary osteoarthritis, left knee: Secondary | ICD-10-CM | POA: Diagnosis not present

## 2018-01-04 DIAGNOSIS — F339 Major depressive disorder, recurrent, unspecified: Secondary | ICD-10-CM

## 2018-01-04 DIAGNOSIS — J439 Emphysema, unspecified: Secondary | ICD-10-CM | POA: Diagnosis not present

## 2018-01-09 DIAGNOSIS — M810 Age-related osteoporosis without current pathological fracture: Secondary | ICD-10-CM | POA: Diagnosis not present

## 2018-01-09 DIAGNOSIS — E785 Hyperlipidemia, unspecified: Secondary | ICD-10-CM | POA: Diagnosis not present

## 2018-01-09 DIAGNOSIS — I10 Essential (primary) hypertension: Secondary | ICD-10-CM | POA: Diagnosis not present

## 2018-01-09 DIAGNOSIS — Z471 Aftercare following joint replacement surgery: Secondary | ICD-10-CM | POA: Diagnosis not present

## 2018-01-09 DIAGNOSIS — Z96651 Presence of right artificial knee joint: Secondary | ICD-10-CM | POA: Diagnosis not present

## 2018-01-09 DIAGNOSIS — M1712 Unilateral primary osteoarthritis, left knee: Secondary | ICD-10-CM | POA: Diagnosis not present

## 2018-01-09 DIAGNOSIS — M1991 Primary osteoarthritis, unspecified site: Secondary | ICD-10-CM | POA: Diagnosis not present

## 2018-01-09 DIAGNOSIS — J449 Chronic obstructive pulmonary disease, unspecified: Secondary | ICD-10-CM | POA: Diagnosis not present

## 2018-01-09 DIAGNOSIS — E1140 Type 2 diabetes mellitus with diabetic neuropathy, unspecified: Secondary | ICD-10-CM | POA: Diagnosis not present

## 2018-01-09 DIAGNOSIS — F339 Major depressive disorder, recurrent, unspecified: Secondary | ICD-10-CM | POA: Diagnosis not present

## 2018-01-10 ENCOUNTER — Telehealth (INDEPENDENT_AMBULATORY_CARE_PROVIDER_SITE_OTHER): Payer: Self-pay | Admitting: Orthopedic Surgery

## 2018-01-10 NOTE — Telephone Encounter (Signed)
Erline Levine, PT with [facility] called needing approval for her to see the patient for 3x a week for 2 weeks.  CB#[phone].  Thank you.

## 2018-01-10 NOTE — Telephone Encounter (Signed)
IC verbal given.  

## 2018-01-11 DIAGNOSIS — I10 Essential (primary) hypertension: Secondary | ICD-10-CM | POA: Diagnosis not present

## 2018-01-11 DIAGNOSIS — Z96651 Presence of right artificial knee joint: Secondary | ICD-10-CM | POA: Diagnosis not present

## 2018-01-11 DIAGNOSIS — E1140 Type 2 diabetes mellitus with diabetic neuropathy, unspecified: Secondary | ICD-10-CM | POA: Diagnosis not present

## 2018-01-11 DIAGNOSIS — Z471 Aftercare following joint replacement surgery: Secondary | ICD-10-CM | POA: Diagnosis not present

## 2018-01-11 DIAGNOSIS — J449 Chronic obstructive pulmonary disease, unspecified: Secondary | ICD-10-CM | POA: Diagnosis not present

## 2018-01-11 DIAGNOSIS — M1712 Unilateral primary osteoarthritis, left knee: Secondary | ICD-10-CM | POA: Diagnosis not present

## 2018-01-12 ENCOUNTER — Telehealth (INDEPENDENT_AMBULATORY_CARE_PROVIDER_SITE_OTHER): Payer: Self-pay | Admitting: Orthopedic Surgery

## 2018-01-12 ENCOUNTER — Telehealth: Payer: Self-pay | Admitting: Family Medicine

## 2018-01-12 ENCOUNTER — Ambulatory Visit (INDEPENDENT_AMBULATORY_CARE_PROVIDER_SITE_OTHER): Payer: Medicare Other | Admitting: Family Medicine

## 2018-01-12 ENCOUNTER — Other Ambulatory Visit: Payer: Self-pay

## 2018-01-12 ENCOUNTER — Encounter: Payer: Self-pay | Admitting: Family Medicine

## 2018-01-12 VITALS — BP 120/62 | HR 81 | Temp 98.4°F | Ht 65.75 in | Wt 214.8 lb

## 2018-01-12 DIAGNOSIS — Z96651 Presence of right artificial knee joint: Secondary | ICD-10-CM

## 2018-01-12 DIAGNOSIS — M1711 Unilateral primary osteoarthritis, right knee: Secondary | ICD-10-CM

## 2018-01-12 MED ORDER — TRAMADOL HCL 50 MG PO TABS: 50.0000 mg | ORAL_TABLET | Freq: Four times a day (QID) | ORAL | 2 refills | Status: AC | PRN

## 2018-01-12 NOTE — Telephone Encounter (Unsigned)
Copied from [place]. Topic: Quick Communication - Rx Refill/Question >> Jan 12, 2018  2:44 PM Hewitt Shorts wrote: Medication: burpropion xl 150 mg tabs   Has the patient contacted their pharmacy? No-pt states not refill      Preferred Pharmacy (with phone number or street name): [facility] [address]   Agent: Please be advised that RX refills may take up to 3 business days. We ask that you follow-up with your pharmacy.

## 2018-01-12 NOTE — Telephone Encounter (Signed)
Esther-(OT) with [facility] called advised did (OT) eval today need verbal orders for (OT) 1 wk 1. The number to contact Sherlynn Stalls is [phone]

## 2018-01-12 NOTE — Telephone Encounter (Signed)
IC, verbal given

## 2018-01-12 NOTE — Progress Notes (Signed)
Dr. Frederico Hamman T. Chistian Kasler, MD, [facility] [place], [address] Phone: [phone] Fax: [phone]  01/12/2018  Patient: Amanda [name], MRN: [id number], DOB: [birth year], 67 y.o.  Primary Physician:  Owens Loffler, MD   Chief Complaint  Patient presents with  . Hospitalization Follow-up    Right Knee Total Arthroplasty   Subjective:   Amanda [name] is a 67 y.o. very pleasant female patient who presents with the following:  F/u R total knee arthroplasty:  Admit date: 12/30/2017 Discharge date: 01/02/2018  Going back to see Dr. Marlou Sa tomorrow.  Pushing her a lot yesterday - had a lot of pain yesterday afternoon.   Did not get sent any pain meds since "Sunday. Would like something - Has taken some Tylenol and Ibuprofen together.  Only real problem is not getting pain to relax.   DOS: 12/30/2017.  After tomorrow, can switch to aspirin and d/c xarelto. I pulled the Up To Date articles on this.  O/w doing ok, but still with a tough situation with her husband's declining health.  Past Medical History, Surgical History, Social History, Family History, Problem List, Medications, and Allergies have been reviewed and updated if relevant.  Patient Active Problem List   Diagnosis Date Noted  . Arthritis of knee 12/30/2017  . 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 sting   . COPD (chronic obstructive pulmonary disease) (HCC) 10/30/2016  . Gastroesophageal reflux disease 09/17/2016  . Degenerative arthritis of left knee 12/24/2015  . Memory loss 10/29/2015  . Primary osteoarthritis of right knee 06/16/2015  . Smoker 05/10/2015  . Solitary pulmonary nodule 03/28/2013  . Hypertension   . UNSPECIFIED VITAMIN D DEFICIENCY 05/29/2009  . Pure hypercholesterolemia 12/07/2007  . Major depressive disorder, recurrent episode, in partial remission (HCC) 12/07/2007  . Diabetes mellitus type 2, controlled (HCC) 08/10/2007    Past Medical History:    Diagnosis Date  . 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 sting    bee stings  . Anxiety   . Aortic stenosis    a. 2013: nl LV sys fxn, mild MR, no evidence of pulm htn; b. TTE 8/17: EF 60-65%, no RWMA, nl LV dia fxn,  mild AS, mod AI  . COPD (chronic obstructive pulmonary disease) (HCC) 10/30/2016  . Degenerative arthritis of knee, bilateral 06/16/2015  . Depression   . Diabetes (HCC)    diet managed-type 11  . GERD (gastroesophageal reflux disease)   . Hiatal hernia with gastroesophageal reflux 1997  . Hyperlipidemia   . Hypertension   . 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 Osteoporosis   . Palpitations    a. 03/2012: 48-hour Holter monitor showed intermittent sinus tachycardia with occasional PACs, without other significant arrhythmia  . Thyroid disease    hyperthyroidism as a teenager - received some injections age 12 that corrected this   . Tobacco abuse     Past Surgical History:  Procedure Laterality Date  . ABDOMINAL HYSTERECTOMY  1993  . BACK SURGERY    . BREAST BIOPSY Right 2007  . CHOLECYSTECTOMY    . COLONOSCOPY     30"  years ago was normal per pt.   . ESOPHAGEAL DILATION     x 3  . fractured leg Right   . OVARIAN CYST REMOVAL  1972  . SALIVARY GLAND SURGERY    . TONSILLECTOMY     4 yoa  . TOTAL KNEE ARTHROPLASTY Left 12/24/2015   Procedure: LEFT TOTAL  KNEE ARTHROPLASTY;  Surgeon: [name], MD;  Location: [facility];  Service: Orthopedics;  Laterality: Left;  . TOTAL KNEE ARTHROPLASTY Right 12/30/2017   Procedure: RIGHT TOTAL KNEE ARTHROPLASTY;  Surgeon: [name], MD;  Location: [facility];  Service: Orthopedics;  Laterality: Right;  . WRIST FRACTURE SURGERY  11/2008    Social History   Socioeconomic History  . Marital status: Married    Spouse name: Not on file  . Number of children: 2  . Years of education: Not on file  . Highest education level: Not on file  Social Needs  . Financial resource strain: Not on file  . Food insecurity  - worry: Not on file  . Food insecurity - inability: Not on file  . Transportation needs - medical: Not on file  . Transportation needs - non-medical: Not on file  Occupational History  . Occupation: Chief Executive Officer: [employer]  Tobacco Use  . Smoking status: Current Every Day Smoker    Packs/day: 0.75    Years: 45.00    Pack years: 33.75    Types: Cigarettes  . Smokeless tobacco: Never Used  . Tobacco comment: trying to quit before knee surgery  Substance and Sexual Activity  . Alcohol use: Yes    Alcohol/week: 0.0 oz    Comment: glass of wine twice a month   . Drug use: No  . Sexual activity: Not on file  Other Topics Concern  . Not on file  Social History Narrative  . Not on file    Family History  Problem Relation Age of Onset  . Hyperlipidemia Mother   . Arthritis Mother   . Diabetes Mother   . Heart disease Mother   . Colon polyps Mother   . Cancer Paternal Grandmother        breast  . Hypercholesterolemia Sister   . Colon polyps Sister   . Breast cancer Sister   . Colon cancer Neg Hx   . Esophageal cancer Neg Hx   . Rectal cancer Neg Hx   . Stomach cancer Neg Hx   . Pancreatic cancer Neg Hx     Allergies  Allergen Reactions  . Diazepam Other (See Comments)    REACTION: pt states she gets very angry and abusive verbally on this medication     Medication list reviewed and updated in full in [facility].  GEN: No acute illnesses, no fevers, chills. GI: No n/v/d, eating normally Pulm: No SOB Interactive and getting along well at home.  Otherwise, ROS is as per the HPI.  Objective:   BP 120/62   Pulse 81   Temp 98.4 F (36.9 C) (Oral)   Ht 5' 5.75" (1.67 m)   Wt 214 lb 12 oz (97.4 kg)   BMI 34.93 kg/m   GEN: WDWN, NAD, Non-toxic, A & O x 3 HEENT: Atraumatic, Normocephalic. Neck supple. No masses, No LAD. Ears and Nose: No external deformity. CV: RRR, No M/G/R. No JVD. No thrill. No extra  heart sounds. PULM: CTA B, no wheezes, crackles, rhonchi. No retractions. No resp. distress. No accessory muscle use. EXTR: No c/c/e NEURO Stands up with walker and walks an appopriate gait. PSYCH: Normally interactive. Conversant. Not depressed or anxious appearing.  Calm demeanor.   Laboratory and Imaging Data:  Assessment and Plan:   Primary osteoarthritis of right knee  Status post total knee replacement, right  She seems to be doing well, and is actually doing  better compared to her prior knee replacement on the other side.  Primary issue now is just pain control pain management.  She wanted something for now, and I think it reasonable to give her some tramadol.  She does have follow-up with Dr. Marlou Sa tomorrow for his input. I certainly think it is reasonable for her to stop her antiplatelet medication after the 14-day window.  She will does follow with me for routine care otherwise.  Follow-up: No Follow-up on file.  Meds ordered this encounter  Medications  . traMADol (ULTRAM) 50 MG tablet    Sig: Take 1 tablet (50 mg total) by mouth every 6 (six) hours as needed for up to 10 days.    Dispense:  50 tablet    Refill:  2   Signed,  Maryhelen Lindler T. Trease Bremner, MD   Allergies as of 01/12/2018      Reactions   Diazepam Other (See Comments)   REACTION: pt states she gets very angry and abusive verbally on this medication      Medication List        Accurate as of 01/12/18 11:59 PM. Always use your most recent med list.          albuterol 108 (90 Base) MCG/ACT inhaler Commonly known as:  PROVENTIL HFA;VENTOLIN HFA Inhale 2 puffs into the lungs every 4 (four) hours as needed for wheezing or shortness of breath.   amitriptyline 50 MG tablet Commonly known as:  ELAVIL TAKE TWO TABLETS BY MOUTH AT NIGHT   atorvastatin 40 MG tablet Commonly known as:  LIPITOR TAKE ONE TABLET BY MOUTH ONCE DAILY   buPROPion 150 MG 24 hr tablet Commonly known as:  WELLBUTRIN XL TAKE ONE  TABLET BY MOUTH ONCE DAILY   cetirizine 10 MG chewable tablet Commonly known as:  ZYRTEC Chew 10 mg by mouth daily.   clonazePAM 0.5 MG tablet Commonly known as:  KLONOPIN TAKE 1 TABLET BY MOUTH TWICE DAILY AS NEEDED   cyclobenzaprine 10 MG tablet Commonly known as:  FLEXERIL TAKE ONE TABLET BY MOUTH THREE TIMES DAILY AS NEEDED FOR MUSCLE SPASM   EQ FIBER THERAPY 625 MG tablet Generic drug:  polycarbophil Take 625 mg by mouth daily. FIBER THERAPY   FLUoxetine 40 MG capsule Commonly known as:  PROZAC Take 40 mg by mouth 2 (two) times daily.   lansoprazole 30 MG capsule Commonly known as:  PREVACID Take 30 mg by mouth daily at 12 noon.   magnesium oxide 400 MG tablet Commonly known as:  MAG-OX Take 400 mg by mouth daily.   metoprolol tartrate 25 MG tablet Commonly known as:  LOPRESSOR Take 0.5 tablets (12.5 mg total) by mouth 2 (two) times daily.   multivitamin tablet Take 1 tablet by mouth daily.   rivaroxaban 10 MG Tabs tablet Commonly known as:  XARELTO Take 1 tablet (10 mg total) by mouth daily with breakfast.   traMADol 50 MG tablet Commonly known as:  ULTRAM Take 1 tablet (50 mg total) by mouth every 6 (six) hours as needed for up to 10 days.   vitamin C 1000 MG tablet Take 1,000 mg by mouth daily.   Vitamin D 1000 units capsule Take 1,000 Units by mouth 2 (two) times daily.

## 2018-01-13 ENCOUNTER — Encounter (INDEPENDENT_AMBULATORY_CARE_PROVIDER_SITE_OTHER): Payer: Self-pay | Admitting: Orthopedic Surgery

## 2018-01-13 ENCOUNTER — Ambulatory Visit (INDEPENDENT_AMBULATORY_CARE_PROVIDER_SITE_OTHER): Payer: Medicare Other

## 2018-01-13 ENCOUNTER — Ambulatory Visit (INDEPENDENT_AMBULATORY_CARE_PROVIDER_SITE_OTHER): Payer: Medicare Other | Admitting: Orthopedic Surgery

## 2018-01-13 DIAGNOSIS — Z96651 Presence of right artificial knee joint: Secondary | ICD-10-CM | POA: Diagnosis not present

## 2018-01-14 ENCOUNTER — Telehealth (INDEPENDENT_AMBULATORY_CARE_PROVIDER_SITE_OTHER): Payer: Self-pay

## 2018-01-14 DIAGNOSIS — Z471 Aftercare following joint replacement surgery: Secondary | ICD-10-CM | POA: Diagnosis not present

## 2018-01-14 DIAGNOSIS — M1712 Unilateral primary osteoarthritis, left knee: Secondary | ICD-10-CM | POA: Diagnosis not present

## 2018-01-14 DIAGNOSIS — E1140 Type 2 diabetes mellitus with diabetic neuropathy, unspecified: Secondary | ICD-10-CM | POA: Diagnosis not present

## 2018-01-14 DIAGNOSIS — Z96651 Presence of right artificial knee joint: Secondary | ICD-10-CM | POA: Diagnosis not present

## 2018-01-14 DIAGNOSIS — I10 Essential (primary) hypertension: Secondary | ICD-10-CM | POA: Diagnosis not present

## 2018-01-14 DIAGNOSIS — J449 Chronic obstructive pulmonary disease, unspecified: Secondary | ICD-10-CM | POA: Diagnosis not present

## 2018-01-14 NOTE — Telephone Encounter (Signed)
Received a call from [facility] patients nurse case manager asking for order for P.T. To be faxed to [phone]. This was done. Also faxed op note and patients demographics.

## 2018-01-15 ENCOUNTER — Encounter (INDEPENDENT_AMBULATORY_CARE_PROVIDER_SITE_OTHER): Payer: Self-pay | Admitting: Orthopedic Surgery

## 2018-01-15 NOTE — Progress Notes (Signed)
Post-Op Visit Note   Patient: Amanda [name]           Date of Birth: 02/03/1951           MRN: [id number] Visit Date: 01/13/2018 PCP: Owens Loffler, MD   Assessment & Plan:  Chief Complaint:  Chief Complaint  Patient presents with  . Right Knee - Routine Post Op   Visit Diagnoses:  1. Status post total right knee replacement     Plan: Cecylia is a patient is now 2 weeks out right total knee replacement.  The Ultram for pain.  She is living at home.  She has achieved 110 of flexion.  He is doing well with her rehab.  Incision looks intact.  Plan at this time is to continue with physical therapy.  I do not think she really needs a CPM because her flexion is excellent at this time.  I will see her back in 4 weeks for final clinical check.  Follow-Up Instructions: Return in about 4 weeks (around 02/10/2018).   Orders:  Orders Placed This Encounter  Procedures  . XR Knee 1-2 Views Right   No orders of the defined types were placed in this encounter.   Imaging: No results found.  PMFS History: Patient Active Problem List   Diagnosis Date Noted  . Arthritis of knee 12/30/2017  . 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 sting   . COPD (chronic obstructive pulmonary disease) ([place]) 10/30/2016  . Gastroesophageal reflux disease 09/17/2016  . Degenerative arthritis of left knee 12/24/2015  . Memory loss 10/29/2015  . Primary osteoarthritis of right knee 06/16/2015  . Smoker 05/10/2015  . Solitary pulmonary nodule 03/28/2013  . Hypertension   . UNSPECIFIED VITAMIN D DEFICIENCY 05/29/2009  . Pure hypercholesterolemia 12/07/2007  . Major depressive disorder, recurrent episode, in partial remission ([place]) 12/07/2007  . Diabetes mellitus type 2, controlled ([place]) 08/10/2007   Past Medical History:  Diagnosis Date  . 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 sting    bee stings  . Anxiety   . Aortic stenosis    a. 2013: nl LV sys fxn, mild MR, no evidence of pulm htn; b. TTE 8/17: EF 60-65%, no RWMA, nl LV dia fxn,  mild  AS, mod AI  . COPD (chronic obstructive pulmonary disease) ([place]) 10/30/2016  . Degenerative arthritis of knee, bilateral 06/16/2015  . Depression   . Diabetes ([place])    diet managed-type 11  . GERD (gastroesophageal reflux disease)   . Hiatal hernia with gastroesophageal reflux 1997  . Hyperlipidemia   . Hypertension   . 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 Osteoporosis   . Palpitations    a. 03/2012: 48-hour Holter monitor showed intermittent sinus tachycardia with occasional PACs, without other significant arrhythmia  . Thyroid disease    hyperthyroidism as a teenager - received some injections age 51 that corrected this   . Tobacco abuse     Family History  Problem Relation Age of Onset  . Hyperlipidemia Mother   . Arthritis Mother   . Diabetes Mother   . Heart disease Mother   . Colon polyps Mother   . Cancer Paternal Grandmother        breast  . Hypercholesterolemia Sister   . Colon polyps Sister   . Breast cancer Sister   . Colon cancer Neg Hx   . Esophageal cancer Neg Hx   . Rectal cancer Neg Hx   . Stomach cancer Neg Hx   .  Pancreatic cancer Neg Hx     Past Surgical History:  Procedure Laterality Date  . ABDOMINAL HYSTERECTOMY  1993  . BACK SURGERY    . BREAST BIOPSY Right 2007  . CHOLECYSTECTOMY    . COLONOSCOPY     30 years ago was normal per pt.   . ESOPHAGEAL DILATION     x 3  . fractured leg Right   . OVARIAN CYST REMOVAL  1972  . SALIVARY GLAND SURGERY    . TONSILLECTOMY     4 yoa  . TOTAL KNEE ARTHROPLASTY Left 12/24/2015   Procedure: LEFT TOTAL KNEE ARTHROPLASTY;  Surgeon: [name], MD;  Location: [facility];  Service: Orthopedics;  Laterality: Left;  . TOTAL KNEE ARTHROPLASTY Right 12/30/2017   Procedure: RIGHT TOTAL KNEE ARTHROPLASTY;  Surgeon: [name], MD;  Location: [facility];  Service: Orthopedics;  Laterality: Right;  . WRIST FRACTURE SURGERY  11/2008   Social History   Occupational History  .  Occupation: Chief Executive Officer: [employer]  Tobacco Use  . Smoking status: Current Every Day Smoker    Packs/day: 0.75    Years: 45.00    Pack years: 33.75    Types: Cigarettes  . Smokeless tobacco: Never Used  . Tobacco comment: trying to quit before knee surgery  Substance and Sexual Activity  . Alcohol use: Yes    Alcohol/week: 0.0 oz    Comment: glass of wine twice a month   . Drug use: No  . Sexual activity: Not on file

## 2018-01-17 ENCOUNTER — Telehealth: Payer: Self-pay | Admitting: Family Medicine

## 2018-01-17 DIAGNOSIS — I10 Essential (primary) hypertension: Secondary | ICD-10-CM | POA: Diagnosis not present

## 2018-01-17 DIAGNOSIS — Z96651 Presence of right artificial knee joint: Secondary | ICD-10-CM

## 2018-01-17 DIAGNOSIS — Z471 Aftercare following joint replacement surgery: Secondary | ICD-10-CM | POA: Diagnosis not present

## 2018-01-17 DIAGNOSIS — E1140 Type 2 diabetes mellitus with diabetic neuropathy, unspecified: Secondary | ICD-10-CM | POA: Diagnosis not present

## 2018-01-17 DIAGNOSIS — M1712 Unilateral primary osteoarthritis, left knee: Secondary | ICD-10-CM | POA: Diagnosis not present

## 2018-01-17 DIAGNOSIS — J449 Chronic obstructive pulmonary disease, unspecified: Secondary | ICD-10-CM | POA: Diagnosis not present

## 2018-01-17 NOTE — Telephone Encounter (Signed)
Copied from [place]. Topic: Quick Communication - See Telephone Encounter >> Jan 17, 2018  2:50 PM Percell Belt A wrote: CRM for notification. See Telephone encounter for: [name] -[phone] Pt is in need of a 3 in 1 commode and a tub bench   01/17/18.

## 2018-01-17 NOTE — Telephone Encounter (Signed)
Pt seen 01/12/18.Please advise.

## 2018-01-17 NOTE — Telephone Encounter (Signed)
This is a reasonable post-op order. Help?

## 2018-01-18 NOTE — Telephone Encounter (Signed)
Spoke with Sherlynn Stalls with [facility].  She states she just needs a verbal order and she will be the order in.  Verbal order given for 3 in 1 commode and bath bench per Dr. Lorelei Pont.

## 2018-01-19 ENCOUNTER — Telehealth (INDEPENDENT_AMBULATORY_CARE_PROVIDER_SITE_OTHER): Payer: Self-pay

## 2018-01-19 ENCOUNTER — Telehealth: Payer: Self-pay | Admitting: Family Medicine

## 2018-01-19 ENCOUNTER — Ambulatory Visit: Payer: Self-pay | Admitting: *Deleted

## 2018-01-19 ENCOUNTER — Other Ambulatory Visit: Payer: Self-pay | Admitting: Family Medicine

## 2018-01-19 DIAGNOSIS — I10 Essential (primary) hypertension: Secondary | ICD-10-CM | POA: Diagnosis not present

## 2018-01-19 DIAGNOSIS — E1140 Type 2 diabetes mellitus with diabetic neuropathy, unspecified: Secondary | ICD-10-CM | POA: Diagnosis not present

## 2018-01-19 DIAGNOSIS — Z96651 Presence of right artificial knee joint: Secondary | ICD-10-CM | POA: Diagnosis not present

## 2018-01-19 DIAGNOSIS — J449 Chronic obstructive pulmonary disease, unspecified: Secondary | ICD-10-CM | POA: Diagnosis not present

## 2018-01-19 DIAGNOSIS — Z471 Aftercare following joint replacement surgery: Secondary | ICD-10-CM | POA: Diagnosis not present

## 2018-01-19 DIAGNOSIS — M1712 Unilateral primary osteoarthritis, left knee: Secondary | ICD-10-CM | POA: Diagnosis not present

## 2018-01-19 MED ORDER — BUPROPION HCL ER (XL) 150 MG PO TB24: 150.0000 mg | ORAL_TABLET | Freq: Every day | ORAL | 1 refills | Status: DC

## 2018-01-19 NOTE — Telephone Encounter (Signed)
Butch Penny, please call the patient.   I am going to send this note to [employer], too.   Normally the treating surgeon manages pain post-operatively after joint replacement surgery. I cannot recall doing so in 15 years. My answer is no.  As far as Xarelto, my interpretation of the literature says that 14 days is appropriate, which we reviewed face to face a few days ago. Ultimately with this I typically defer to the operating surgeon's protocols.  I refilled her Wellbutrin.

## 2018-01-19 NOTE — Telephone Encounter (Signed)
Per Dr. Randel Pigg note, they are going to call Mrs. Lackie and straighten this out.

## 2018-01-19 NOTE — Telephone Encounter (Signed)
Please leave prescription for oxycodone 1 p.o. every 4 to 6 hours as needed pain #60 with no refills at the front desk for this patient.  I called and left and answer left a message on her answering machine thanks

## 2018-01-19 NOTE — Telephone Encounter (Signed)
Copied from [place]. Topic: Quick Communication - See Telephone Encounter >> Jan 19, 2018 10:08 AM Corie Chiquito, NT wrote: CRM for notification. Patient called and stated that her Tramadol Medication isn't doing anything for her and would like to know if Dr.Copland could call her in a different medication. She would like the new medication to be sent to [facility] in [place]. [phone]

## 2018-01-19 NOTE — Telephone Encounter (Signed)
Hi Amanda [name]. Sorry about the mixup with [name].  When she was in the clinic last office visit we actually offered to continue managing all of these things with the blood thinner and pain medicine but she preferred not to drive as far to get that done since she lives in [place].  We are more than happy to continue to prescribe her pain medicine until she does not need it anymore.  And I do typically have them stay on Xarelto for several weeks post op. We will call her and straighten it out..Thanks again  [employer]

## 2018-01-19 NOTE — Telephone Encounter (Signed)
Per Dr Marlou Sa, called patient and advised that from this point forward she would need to get her pain medication from you to decrease confusion on who was managing her pain post operatively. She stated ultram is no longer working and that is why she called Dr Edilia Bo this morning asking for stronger medication. She said she is Not sleeping and in severe pain. The patient persistent  about the inconvenience of having to drive to [facility] to pick up a prescription for pain. I apologized for this but advised it was the law that we could not call in controlled substances. She stated that other offices do this for her. I did not argue with patient but did express that it likely was not the case. She is asking for you to call her about this issue. She said she is confused because different people keep telling her different things.

## 2018-01-19 NOTE — Telephone Encounter (Signed)
No, see my previous message.

## 2018-01-19 NOTE — Telephone Encounter (Signed)
Copied from [place] [id number]. Topic: Quick Communication - See Telephone Encounter >> Jan 19, 2018 10:20 AM Corie Chiquito, NT wrote: CRM for notification. Patient calling because she needs a refill on her Bupropion XL 150mg  tablet, Stated that she is out of refills. She also would like to know if Dr. Lorelei Pont could take over her pain medication as well. Oxycodone and Xarelto that her surgen Dr.Dean gave her she doesn't see him again until 02-10-2018. I did advise the patient that she would need to come into the office to be seen, but she wants Dr.Copland or his nurse to give her a call back about  all of the medications listed above.She can be reached at [phone]. Stated that she wants to wait to see what he says about all of this before making an appointment. Patient would like all medications to be sent to the [facility] in [place]. [phone]. 01/19/18.

## 2018-01-19 NOTE — Telephone Encounter (Signed)
Returned call to pt regarding needing a med stronger than the tramadol for pain in her right knee. She stated that she had taken it with aleve and even taking 1.5 of the tramadol but that did not help.  She is waking up in the night with pain. She wants to know if he will prescribe something stronger or something to help her rest at night.  She is doing her last PT at home today. Friday she will be going to outpatient PT. Please advise.  Reason for Disposition . Caller has URGENT medication question about med that PCP prescribed and triager unable to answer question  Answer Assessment - Initial Assessment Questions 1. SYMPTOMS: "Do you have any symptoms?"     pain 2. SEVERITY: If symptoms are present, ask "Are they mild, moderate or severe?"     Moderate right now and goes up at night  Protocols used: MEDICATION QUESTION CALL-A-AH

## 2018-01-20 MED ORDER — OXYCODONE HCL 5 MG PO CAPS: ORAL_CAPSULE | ORAL | 0 refills | Status: DC

## 2018-01-20 NOTE — Addendum Note (Signed)
Addended byLaurann Montana on: 01/20/2018 08:09 AM   Modules accepted: Orders

## 2018-01-20 NOTE — Telephone Encounter (Signed)
Put up front for patient to pick up.  

## 2018-01-21 ENCOUNTER — Encounter: Payer: Self-pay | Admitting: Physical Therapy

## 2018-01-21 ENCOUNTER — Ambulatory Visit: Payer: Medicare Other | Attending: Orthopedic Surgery | Admitting: Physical Therapy

## 2018-01-21 DIAGNOSIS — M25561 Pain in right knee: Secondary | ICD-10-CM

## 2018-01-21 NOTE — Patient Instructions (Addendum)
Continue standing bilat hip abd/ext, SLR, prop ext stretch, seated SAQ, heel raises. Patient reports she does not need another handout and is able to perform all exercises for PT with correct form.

## 2018-01-21 NOTE — Therapy (Signed)
[facility] [address] [place], [address] Phone: [phone]   Fax:  [phone]  Physical Therapy Evaluation  Patient Details  Name: GLENDA SPELMAN MRN: [id number] Date of Birth: 09/16/1951 Referring Provider: Marlou Sa, MD   Encounter Date: 01/21/2018  PT End of Session - 01/21/18 0958    Visit Number  1    Number of Visits  17    Date for PT Re-Evaluation  03/18/18    PT Start Time  0900    PT Stop Time  0946    PT Time Calculation (min)  46 min    Activity Tolerance  Patient tolerated treatment well    Behavior During Therapy  [facility] for tasks assessed/performed       Past Medical History:  Diagnosis Date  . 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 sting    bee stings  . Anxiety   . Aortic stenosis    a. 2013: nl LV sys fxn, mild MR, no evidence of pulm htn; b. TTE 8/17: EF 60-65%, no RWMA, nl LV dia fxn,  mild AS, mod AI  . COPD (chronic obstructive pulmonary disease) ([place]) 10/30/2016  . Degenerative arthritis of knee, bilateral 06/16/2015  . Depression   . Diabetes ([place])    diet managed-type 11  . GERD (gastroesophageal reflux disease)   . Hiatal hernia with gastroesophageal reflux 1997  . Hyperlipidemia   . Hypertension   . 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 Osteoporosis   . Palpitations    a. 03/2012: 48-hour Holter monitor showed intermittent sinus tachycardia with occasional PACs, without other significant arrhythmia  . Thyroid disease    hyperthyroidism as a teenager - received some injections age 79 that corrected this   . Tobacco abuse     Past Surgical History:  Procedure Laterality Date  . ABDOMINAL HYSTERECTOMY  1993  . BACK SURGERY    . BREAST BIOPSY Right 2007  . CHOLECYSTECTOMY    . COLONOSCOPY     30 years ago was normal per pt.   . ESOPHAGEAL DILATION     x 3  . fractured leg Right   . OVARIAN CYST REMOVAL  1972  . SALIVARY GLAND SURGERY    . TONSILLECTOMY      4 yoa  . TOTAL KNEE ARTHROPLASTY Left 12/24/2015   Procedure: LEFT TOTAL KNEE ARTHROPLASTY;  Surgeon: [name], MD;  Location: [facility];  Service: Orthopedics;  Laterality: Left;  . TOTAL KNEE ARTHROPLASTY Right 12/30/2017   Procedure: RIGHT TOTAL KNEE ARTHROPLASTY;  Surgeon: [name], MD;  Location: [facility];  Service: Orthopedics;  Laterality: Right;  . WRIST FRACTURE SURGERY  11/2008    There were no vitals filed for this visit.   Subjective Assessment - 01/21/18 0903    Subjective  R TKA    Pertinent History  Pt is a pleasant 67 year old female s/p RTKA 1/24. Patient reports she had severe arthritis in bilat knees and multiple falls resulting in a L TKA in Jan 2017, and following R TKA this year.  Patient reports she has a machine at home that flex/ext the knee that she uses every day and that she hashad home health therapy for the past few weeks.     Limitations  Lifting;Standing;Walking;House hold activities    How long can you sit comfortably?  1 hour    How long can you stand comfortably?  30 minutes  How long can you walk comfortably?  10 minutes    Patient Stated Goals  decrease pain     Currently in Pain?  Yes    Pain Location  Knee    Pain Orientation  Right    Pain Descriptors / Indicators  Aching;Dull    Pain Onset  1 to 4 weeks ago    Pain Frequency  Constant    Aggravating Factors   walking, therex from home health    Pain Relieving Factors  ice. medication, rest    Effect of Pain on Daily Activities  unable to vaccum, walk    Multiple Pain Sites  No         [facility] PT Assessment - 01/21/18 0001      Assessment   Medical Diagnosis  R TKA     Referring Provider  Marlou Sa, MD    Onset Date/Surgical Date  12/30/17    Hand Dominance  Right    Next MD Visit  02/10/2018    Prior Therapy  Yes; L TKA successful      Precautions   Precautions  -- no driving      Restrictions   Weight Bearing Restrictions  No      Balance Screen   Has the patient  fallen in the past 6 months  Yes    How many times?  2    Has the patient had a decrease in activity level because of a fear of falling?   No    Is the patient reluctant to leave their home because of a fear of falling?   No      Home Film/video editor residence    Living Arrangements  Spouse/significant other    Available Help at Discharge  Family    Type of [place] to enter    Entrance Stairs-Number of Steps  [place]  One level    [place] - 2 wheels      Prior Function   Level of Independence  Independent    Vocation  -- retired    Leisure  -- unable to walk for leisure       Strength Hip flex: L 4/5 R 4+/5 Hip IR: L 4/5 R 4+/5 Hip ER: L 4/5 R 4/5 Hip abd: L 4-/ 5 R 4/5 Hip ext: L 4-/5 R 4/5 Knee ext L 4+/5 Knee flex L 4+/5 R quad contraction strong w/ ability to complete SLR 10x 20d elevation before quad lag  ROM: Bilat hip ROM wnl L Knee: wnl for flex/ext R knee flex: 104d (supine) R knee ext: -7d (supine) R patella: limited patella movement in all directions  Incision Sight: Clean incision sight; no sign of infection; sensation in tact EXCEPT directly lateral at lower incision sight  DVT Screen: Calf pain? Mild with heavy palpation, described as sore Color? normal Temp? normal Homan's? (-)  6MWT w/ RW: 756ft      Objective measurements completed on examination: See above findings.              PT Education - 01/21/18 0949    Education provided  Yes    Education Details  Patient was educated on diagnosis, anatomy and pathology involved, prognosis, role of PT, and was given an HEP, demonstrating exercise with proper form following verbal and tactile cues, and was given a  paper hand out to continue exercise at home. Pt was educated on and agreed to plan of care.    Person(s) Educated  Patient    Methods  Explanation;Verbal  cues;Tactile cues    Comprehension  Returned demonstration;Verbalized understanding       PT Short Term Goals - 01/21/18 0959      PT SHORT TERM GOAL #1   Title  Pt will be independent with HEP in order to improve strength and balance in order to decrease fall risk and improve function at home and work.    Time  4    Status  New        PT Long Term Goals - 01/21/18 0959      PT LONG TERM GOAL #1   Title  Pt will complete 126ft in 6MWT w/o AD to return to prior level of function to ambulate through grocery store safely    Baseline  2/15 738ft    Time  8    Period  Weeks    Status  New      PT LONG TERM GOAL #2   Title  Pt will increase LEFS by at least 9 points in order to demonstrate significant improvement in lower extremity function.     Baseline  2/15: 27/80    Time  8    Period  Weeks    Status  New      PT LONG TERM GOAL #3   Title  Patient will demonstrate L knee strength 4+/5 and hip L hip gross strength of 4/5 to return to prior level of function to complete squatting activities around the house      PT [place] #4   Title  Patient will demonstrate L knee ext to 0d and flexion to 120d in order to safely negotiate stairs at home    Baseline  2/15 L knee flex 104d L knee ext -7d             Plan - 01/21/18 0954    Clinical Impression Statement  Pt is a 67 year old female s/p R TKA 12/30/17 with current activity limitations in ambulation, sit < > stand transfers, prolonged sitting/standing, stair negotiation, and squatting/kneeling secondary to the following limiting impairments: R knee pain, decreased ROM/stiffness, swelling/edema, decreased strength, decreased weight-bearing tolerance and weightshift RLE. The aforementioned impairments and activity limitations restrict the patient from full participation in hobbies of walking outdoors and being the primary care giver for her husband (diagnosed with PD). Pt will benefit from skilled PT intervention to  address pertinent impairments and activity limitations for best return to PLOF.    History and Personal Factors relevant to plan of care:  2 personal factors/comorbidities, 3 body systems/activity limitations/participation restrictions     Clinical Presentation  Evolving    Clinical Presentation due to:  objective tests and measures    Clinical Decision Making  Moderate    Clinical Impairments Affecting Rehab Potential  (-) co-morbidities, relatively sedentary lifestyle and increased pain medication (+) motivation, prior L TKA with good success    PT Frequency  2x / week    PT Duration  8 weeks    PT Treatment/Interventions  ADLs/Self Care Home Management;Electrical Stimulation;Moist Heat;Traction;Functional mobility training;Therapeutic activities;Therapeutic exercise;Neuromuscular re-education;Patient/family education;Manual techniques;Passive range of motion;Ultrasound;Cryotherapy    PT Next Visit Plan  knee ROM and hip strengthening; gait training    PT Home Exercise Plan  See patient instructions    Consulted and Agree with Plan of Care  Patient       Patient will benefit from skilled therapeutic intervention in order to improve the following deficits and impairments:  Abnormal gait, Decreased balance, Decreased endurance, Decreased mobility, Improper body mechanics, Increased edema, Decreased range of motion, Decreased activity tolerance, Decreased strength  Visit Diagnosis: Acute pain of right knee     Problem List Patient Active Problem List   Diagnosis Date Noted  . Arthritis of knee 12/30/2017  . 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 sting   . COPD (chronic obstructive pulmonary disease) ([place]) 10/30/2016  . Gastroesophageal reflux disease 09/17/2016  . Degenerative arthritis of left knee 12/24/2015  . Memory loss 10/29/2015  . Primary osteoarthritis of right knee 06/16/2015  . Smoker 05/10/2015  . Solitary pulmonary nodule 03/28/2013  . Hypertension   . UNSPECIFIED VITAMIN D DEFICIENCY  05/29/2009  . Pure hypercholesterolemia 12/07/2007  . Major depressive disorder, recurrent episode, in partial remission ([place]) 12/07/2007  . Diabetes mellitus type 2, controlled ([place]) 08/10/2007   Shelton Silvas PT, DPT Shelton Silvas 01/21/2018, 10:05 AM  [facility] [address], [place], [address] Phone: [phone]   Fax:  [phone]  Name: TAIZ BICKLE MRN: [id number] Date of Birth: 05-03-1951

## 2018-01-25 ENCOUNTER — Ambulatory Visit: Payer: Medicare Other | Admitting: Physical Therapy

## 2018-01-25 ENCOUNTER — Encounter: Payer: Self-pay | Admitting: Physical Therapy

## 2018-01-25 DIAGNOSIS — M25561 Pain in right knee: Secondary | ICD-10-CM | POA: Diagnosis not present

## 2018-01-25 NOTE — Therapy (Signed)
[facility] [address], [place], [address] Phone: [phone]   Fax:  [phone]  Physical Therapy Treatment  Patient Details  Name: Amanda [name] MRN: [id number] Date of Birth: 10/13/1951 Referring Provider: Marlou Sa, MD   Encounter Date: 01/25/2018  PT End of Session - 01/25/18 1313    Visit Number  2    Number of Visits  17    Date for PT Re-Evaluation  03/18/18    PT Start Time  0101    PT Stop Time  0148    PT Time Calculation (min)  47 min    Activity Tolerance  Patient tolerated treatment well    Behavior During Therapy  [facility] for tasks assessed/performed       Past Medical History:  Diagnosis Date  . 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 sting    bee stings  . Anxiety   . Aortic stenosis    a. 2013: nl LV sys fxn, mild MR, no evidence of pulm htn; b. TTE 8/17: EF 60-65%, no RWMA, nl LV dia fxn,  mild AS, mod AI  . COPD (chronic obstructive pulmonary disease) ([place]) 10/30/2016  . Degenerative arthritis of knee, bilateral 06/16/2015  . Depression   . Diabetes ([place])    diet managed-type 11  . GERD (gastroesophageal reflux disease)   . Hiatal hernia with gastroesophageal reflux 1997  . Hyperlipidemia   . Hypertension   . 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 Osteoporosis   . Palpitations    a. 03/2012: 48-hour Holter monitor showed intermittent sinus tachycardia with occasional PACs, without other significant arrhythmia  . Thyroid disease    hyperthyroidism as a teenager - received some injections age 48 that corrected this   . Tobacco abuse     Past Surgical History:  Procedure Laterality Date  . ABDOMINAL HYSTERECTOMY  1993  . BACK SURGERY    . BREAST BIOPSY Right 2007  . CHOLECYSTECTOMY    . COLONOSCOPY     30 years ago was normal per pt.   . ESOPHAGEAL DILATION     x 3  . fractured leg Right   . OVARIAN CYST REMOVAL  1972  . SALIVARY GLAND SURGERY    . TONSILLECTOMY      4 yoa  . TOTAL KNEE ARTHROPLASTY Left 12/24/2015   Procedure: LEFT TOTAL KNEE ARTHROPLASTY;  Surgeon: [name], MD;  Location: [facility];  Service: Orthopedics;  Laterality: Left;  . TOTAL KNEE ARTHROPLASTY Right 12/30/2017   Procedure: RIGHT TOTAL KNEE ARTHROPLASTY;  Surgeon: [name], MD;  Location: [facility];  Service: Orthopedics;  Laterality: Right;  . WRIST FRACTURE SURGERY  11/2008    There were no vitals filed for this visit.  Subjective Assessment - 01/25/18 1305    Subjective  Patient reports 6/10 pain in the R knee in the past week, usually with transitional movements and 4/10 pain now. Patient reports compliance with HEP and is using ice pack 2x/day. Patient ambulates into clinic w/ quad cane, which she reports she still uses around the house. No questions or concerns at this time.     Pertinent History  Pt is a pleasant 67 year old female s/p RTKA 1/24. Patient reports she had severe arthritis in bilat knees and multiple falls resulting in a L TKA in Jan 2017, and following R TKA this year.  Patient reports she has a machine at home that flex/ext the  knee that she uses every day and that she hashad home health therapy for the past few weeks.     Limitations  Lifting;Standing;Walking;House hold activities    How long can you sit comfortably?  1 hour    How long can you stand comfortably?  30 minutes    How long can you walk comfortably?  10 minutes    Patient Stated Goals  decrease pain     Currently in Pain?  Yes    Pain Score  4     Pain Location  Knee    Pain Orientation  Right    Pain Descriptors / Indicators  Aching    Pain Type  Acute pain    Pain Onset  1 to 4 weeks ago           Ther-Ex  -Bike warmup 44min for ROM during hx intake and HEP review -Stair flexion/ext stretch 4x 30sec each position demonstration initially required for form -SLR w/ 2# ankle wt 3x 10 w/ cuing for eccentric control -SAQ w/ 2# ankle wt 3x 10 w/ cuing for eccentric  control -Clamshell exercise 3x 10 w/ yellow tband w/t cuing for pelvic positioning   Manual -Patellar mobs lateral and sup/inf GIII 30sec x3 in each direction -Supine hooklying knee flexion stretch w/ PT overpressure 4x 30sec holds -Prop ext stretch w/ PT overpressure 4x 30sec holds -AP femur mobs in supine prop position GIII 30sec bouts x4 -AP tibia mobs in supine hooklying position GIII 30sec bouts x4  Measurements taken today:  Ext: -4d (supine) Flex: 113d (supine)                    PT Education - 01/25/18 1310    Education provided  Yes    Education Details  exercise form    Person(s) Educated  Patient    Methods  Explanation;Verbal cues;Demonstration    Comprehension  Verbalized understanding;Returned demonstration;Tactile cues required;Verbal cues required       PT Short Term Goals - 01/21/18 0959      PT SHORT TERM GOAL #1   Title  Pt will be independent with HEP in order to improve strength and balance in order to decrease fall risk and improve function at home and work.    Time  4    Status  New        PT Long Term Goals - 01/21/18 0959      PT LONG TERM GOAL #1   Title  Pt will complete 1225ft in 6MWT w/o AD to return to prior level of function to ambulate through grocery store safely    Baseline  2/15 768ft    Time  8    Period  Weeks    Status  New      PT LONG TERM GOAL #2   Title  Pt will increase LEFS by at least 9 points in order to demonstrate significant improvement in lower extremity function.     Baseline  2/15: 27/80    Time  8    Period  Weeks    Status  New      PT LONG TERM GOAL #3   Title  Patient will demonstrate L knee strength 4+/5 and hip L hip gross strength of 4/5 to return to prior level of function to complete squatting activities around the house      PT [place] #4   Title  Patient will demonstrate L knee ext to 0d and flexion  to 120d in order to safely negotiate stairs at home    Baseline  2/15 L knee  flex 104d L knee ext -7d            Plan - 01/25/18 1334    Clinical Impression Statement  Pt tolerated therex progression well with no increased pain, only muscular fatigue noted at the end of repititions. Patient is able to shift wt onto R LE, which she reports she is practicing at home, but is still finding SLS difficult. PT will continue to progress strengthening and work on balance, especially in SLS positions in upcoming visits.     Clinical Impairments Affecting Rehab Potential  (-) co-morbidities, relatively sedentary lifestyle and increased pain medication (+) motivation, prior L TKA with good success    PT Frequency  2x / week    PT Duration  8 weeks    PT Treatment/Interventions  ADLs/Self Care Home Management;Electrical Stimulation;Moist Heat;Traction;Functional mobility training;Therapeutic activities;Therapeutic exercise;Neuromuscular re-education;Patient/family education;Manual techniques;Passive range of motion;Ultrasound;Cryotherapy    PT Next Visit Plan  knee ROM and hip strengthening; gait training    PT Home Exercise Plan  See patient instructions + clamshell exercise added today    Consulted and Agree with Plan of Care  Patient       Patient will benefit from skilled therapeutic intervention in order to improve the following deficits and impairments:  Abnormal gait, Decreased balance, Decreased endurance, Decreased mobility, Improper body mechanics, Increased edema, Decreased range of motion, Decreased activity tolerance, Decreased strength  Visit Diagnosis: Acute pain of right knee     Problem List Patient Active Problem List   Diagnosis Date Noted  . Arthritis of knee 12/30/2017  . 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 sting   . COPD (chronic obstructive pulmonary disease) ([place]) 10/30/2016  . Gastroesophageal reflux disease 09/17/2016  . Degenerative arthritis of left knee 12/24/2015  . Memory loss 10/29/2015  . Primary osteoarthritis of right knee 06/16/2015  . Smoker  05/10/2015  . Solitary pulmonary nodule 03/28/2013  . Hypertension   . UNSPECIFIED VITAMIN D DEFICIENCY 05/29/2009  . Pure hypercholesterolemia 12/07/2007  . Major depressive disorder, recurrent episode, in partial remission ([place]) 12/07/2007  . Diabetes mellitus type 2, controlled ([place]) 08/10/2007   Shelton Silvas PT, DPT Shelton Silvas 01/25/2018, 1:49 PM  [facility] [address], [place], [address] Phone: [phone]   Fax:  [phone]  Name: Amanda [name] MRN: [id number] Date of Birth: 04/29/1951

## 2018-01-27 ENCOUNTER — Encounter: Payer: Self-pay | Admitting: Physical Therapy

## 2018-01-27 ENCOUNTER — Ambulatory Visit: Payer: Medicare Other | Admitting: Physical Therapy

## 2018-01-27 DIAGNOSIS — M25561 Pain in right knee: Secondary | ICD-10-CM | POA: Diagnosis not present

## 2018-01-27 NOTE — Therapy (Signed)
[facility] [address] [place], [address] Phone: [phone]   Fax:  [phone]  Physical Therapy Treatment  Patient Details  Name: Amanda [name] MRN: [id number] Date of Birth: 1951-11-20 Referring Provider: Marlou Sa, MD   Encounter Date: 01/27/2018  PT End of Session - 01/27/18 1410    Visit Number  3    Number of Visits  17    Date for PT Re-Evaluation  03/18/18    PT Start Time  0145    PT Stop Time  0230    PT Time Calculation (min)  45 min    Activity Tolerance  Patient tolerated treatment well    Behavior During Therapy  [facility] for tasks assessed/performed       Past Medical History:  Diagnosis Date  . 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 sting    bee stings  . Anxiety   . Aortic stenosis    a. 2013: nl LV sys fxn, mild MR, no evidence of pulm htn; b. TTE 8/17: EF 60-65%, no RWMA, nl LV dia fxn,  mild AS, mod AI  . COPD (chronic obstructive pulmonary disease) ([place]) 10/30/2016  . Degenerative arthritis of knee, bilateral 06/16/2015  . Depression   . Diabetes ([place])    diet managed-type 11  . GERD (gastroesophageal reflux disease)   . Hiatal hernia with gastroesophageal reflux 1997  . Hyperlipidemia   . Hypertension   . 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 Osteoporosis   . Palpitations    a. 03/2012: 48-hour Holter monitor showed intermittent sinus tachycardia with occasional PACs, without other significant arrhythmia  . Thyroid disease    hyperthyroidism as a teenager - received some injections age 16 that corrected this   . Tobacco abuse     Past Surgical History:  Procedure Laterality Date  . ABDOMINAL HYSTERECTOMY  1993  . BACK SURGERY    . BREAST BIOPSY Right 2007  . CHOLECYSTECTOMY    . COLONOSCOPY     30 years ago was normal per pt.   . ESOPHAGEAL DILATION     x 3  . fractured leg Right   . OVARIAN CYST REMOVAL  1972  . SALIVARY GLAND SURGERY    . TONSILLECTOMY      4 yoa  . TOTAL KNEE ARTHROPLASTY Left 12/24/2015   Procedure: LEFT TOTAL KNEE ARTHROPLASTY;  Surgeon: [name], MD;  Location: [facility];  Service: Orthopedics;  Laterality: Left;  . TOTAL KNEE ARTHROPLASTY Right 12/30/2017   Procedure: RIGHT TOTAL KNEE ARTHROPLASTY;  Surgeon: [name], MD;  Location: [facility];  Service: Orthopedics;  Laterality: Right;  . WRIST FRACTURE SURGERY  11/2008    There were no vitals filed for this visit.  Subjective Assessment - 01/27/18 1349    Subjective  Patient reports she is having a lot going on emotionally. Over the weekend she found out her mother in [place] has terminal cancer and her daughter fell and broke her arm. Patient is concerned about how she will be able to take care of her family as she is still not supposed to be driving per her MD. She reports her pain in the knee is a 4/10 today from all the walking she has had to do in the past week. Pt reports she has been ompliant with her HEP.     Pertinent History  Pt is a pleasant 67 year old female s/p RTKA 1/24. Patient  reports she had severe arthritis in bilat knees and multiple falls resulting in a L TKA in Jan 2017, and following R TKA this year.  Patient reports she has a machine at home that flex/ext the knee that she uses every day and that she hashad home health therapy for the past few weeks.     Limitations  Lifting;Standing;Walking;House hold activities    How long can you sit comfortably?  1 hour    How long can you stand comfortably?  30 minutes    How long can you walk comfortably?  10 minutes    Patient Stated Goals  decrease pain     Pain Score  4     Pain Onset  1 to 4 weeks ago           Ther-Ex  -Bike warmup 33min for ROM during hx intake w/ PT  -SLR w/ 2# ankle wt 3x 12 w/ cuing for eccentric control -SAQ w/ 2# ankle wt 3x 12 w/ cuing for eccentric control -Clamshell exercise 3x 10 w/ yellow tband w/t cuing for pelvic positioning -SLS exercise at treadmill bar for  support holding for 20sec with bilat 2 finger support x4 trials > bilat 1 finger support x3 trails > patient ttempting to SLS on R LE w/o HH support x6 trails able to hold between 5-10sec per trial. Education on wt shifting as a pre-cursor for SLS    Manual -Patellar mobs lateral and sup/inf GIII 30sec x3 in each direction -Supine hooklying knee flexion stretch w/ PT overpressure 4x 30sec holds -Prop ext stretch w/ PT overpressure 4x 30sec holds -AP femur mobs in supine prop position GIII 30sec bouts x4 -AP tibia mobs in supine hooklying position GIII 30sec bouts x4   Measurements taken today:  Ext: -2d (supine) Flex: 121d (supine)                    PT Education - 01/27/18 1352    Education provided  Yes    Education Details  Exercise form    Person(s) Educated  Patient    Methods  Explanation;Demonstration;Verbal cues    Comprehension  Verbalized understanding;Returned demonstration;Verbal cues required       PT Short Term Goals - 01/21/18 0959      PT SHORT TERM GOAL #1   Title  Pt will be independent with HEP in order to improve strength and balance in order to decrease fall risk and improve function at home and work.    Time  4    Status  New        PT Long Term Goals - 01/21/18 0959      PT LONG TERM GOAL #1   Title  Pt will complete 12100ft in 6MWT w/o AD to return to prior level of function to ambulate through grocery store safely    Baseline  2/15 715ft    Time  8    Period  Weeks    Status  New      PT LONG TERM GOAL #2   Title  Pt will increase LEFS by at least 9 points in order to demonstrate significant improvement in lower extremity function.     Baseline  2/15: 27/80    Time  8    Period  Weeks    Status  New      PT LONG TERM GOAL #3   Title  Patient will demonstrate L knee strength 4+/5 and hip L hip gross strength of  4/5 to return to prior level of function to complete squatting activities around the house      PT [place] #4    Title  Patient will demonstrate L knee ext to 0d and flexion to 120d in order to safely negotiate stairs at home    Baseline  2/15 L knee flex 104d L knee ext -7d            Plan - 01/27/18 1418    Clinical Impression Statement  Pt tolerated therex progression well with no increased pain. Patient had a much flatter affact today d/t emotional stress, but was still able to fully participate and was very motivated throughout PT session.     PT Treatment/Interventions  ADLs/Self Care Home Management;Electrical Stimulation;Moist Heat;Traction;Functional mobility training;Therapeutic activities;Therapeutic exercise;Neuromuscular re-education;Patient/family education;Manual techniques;Passive range of motion;Ultrasound;Cryotherapy    PT Next Visit Plan  knee ROM and hip strengthening; gait training    PT Home Exercise Plan  See patient instructions + clamshell exercise added today    Consulted and Agree with Plan of Care  Patient       Patient will benefit from skilled therapeutic intervention in order to improve the following deficits and impairments:     Visit Diagnosis: Acute pain of right knee     Problem List Patient Active Problem List   Diagnosis Date Noted  . Arthritis of knee 12/30/2017  . 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 sting   . COPD (chronic obstructive pulmonary disease) ([place]) 10/30/2016  . Gastroesophageal reflux disease 09/17/2016  . Degenerative arthritis of left knee 12/24/2015  . Memory loss 10/29/2015  . Primary osteoarthritis of right knee 06/16/2015  . Smoker 05/10/2015  . Solitary pulmonary nodule 03/28/2013  . Hypertension   . UNSPECIFIED VITAMIN D DEFICIENCY 05/29/2009  . Pure hypercholesterolemia 12/07/2007  . Major depressive disorder, recurrent episode, in partial remission ([place]) 12/07/2007  . Diabetes mellitus type 2, controlled ([place]) 08/10/2007   Shelton Silvas PT, DPT Shelton Silvas 01/27/2018, 2:39 PM  [facility] [address] [place], [address] Phone: [phone]   Fax:  [phone]  Name: Amanda [name] MRN: [id number] Date of Birth: 1951-03-22

## 2018-01-28 ENCOUNTER — Telehealth (INDEPENDENT_AMBULATORY_CARE_PROVIDER_SITE_OTHER): Payer: Self-pay | Admitting: Orthopedic Surgery

## 2018-01-28 NOTE — Telephone Encounter (Signed)
Patient called advised her mother is very ill and she need to fly to [place]. Patient advised the hospice nurse told her to get a medical clearance to fly there. The number to contact patient is [phone]. Patient will pick up note when its ready. Patient asked if she can pick up note today.

## 2018-01-28 NOTE — Telephone Encounter (Signed)
I tried calling her to let her know note can be picked up at front desk. No answer and mailbox is full. Unable to LM.

## 2018-01-28 NOTE — Telephone Encounter (Signed)
Ok for note 

## 2018-01-28 NOTE — Telephone Encounter (Signed)
No current medical contraindication to flying

## 2018-02-01 ENCOUNTER — Encounter: Payer: Self-pay | Admitting: Physical Therapy

## 2018-02-01 ENCOUNTER — Ambulatory Visit: Payer: Medicare Other | Admitting: Physical Therapy

## 2018-02-01 DIAGNOSIS — M25561 Pain in right knee: Secondary | ICD-10-CM

## 2018-02-01 NOTE — Therapy (Signed)
[facility] [address] [place], [address] Phone: [phone]   Fax:  [phone]  Physical Therapy Treatment  Patient Details  Name: Amanda [name] MRN: [id number] Date of Birth: 1951/09/01 Referring Provider: Marlou Sa, MD   Encounter Date: 02/01/2018  PT End of Session - 02/01/18 1335    Visit Number  4    Number of Visits  17    Date for PT Re-Evaluation  03/18/18    PT Start Time  6644    PT Stop Time  1345    PT Time Calculation (min)  52 min    Activity Tolerance  Patient tolerated treatment well    Behavior During Therapy  [facility] for tasks assessed/performed       Past Medical History:  Diagnosis Date  . 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 sting    bee stings  . Anxiety   . Aortic stenosis    a. 2013: nl LV sys fxn, mild MR, no evidence of pulm htn; b. TTE 8/17: EF 60-65%, no RWMA, nl LV dia fxn,  mild AS, mod AI  . COPD (chronic obstructive pulmonary disease) ([place]) 10/30/2016  . Degenerative arthritis of knee, bilateral 06/16/2015  . Depression   . Diabetes ([place])    diet managed-type 11  . GERD (gastroesophageal reflux disease)   . Hiatal hernia with gastroesophageal reflux 1997  . Hyperlipidemia   . Hypertension   . 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 Osteoporosis   . Palpitations    a. 03/2012: 48-hour Holter monitor showed intermittent sinus tachycardia with occasional PACs, without other significant arrhythmia  . Thyroid disease    hyperthyroidism as a teenager - received some injections age 45 that corrected this   . Tobacco abuse     Past Surgical History:  Procedure Laterality Date  . ABDOMINAL HYSTERECTOMY  1993  . BACK SURGERY    . BREAST BIOPSY Right 2007  . CHOLECYSTECTOMY    . COLONOSCOPY     30 years ago was normal per pt.   . ESOPHAGEAL DILATION     x 3  . fractured leg Right   . OVARIAN CYST REMOVAL  1972  . SALIVARY GLAND SURGERY    . TONSILLECTOMY      4 yoa  . TOTAL KNEE ARTHROPLASTY Left 12/24/2015   Procedure: LEFT TOTAL KNEE ARTHROPLASTY;  Surgeon: [name], MD;  Location: [facility];  Service: Orthopedics;  Laterality: Left;  . TOTAL KNEE ARTHROPLASTY Right 12/30/2017   Procedure: RIGHT TOTAL KNEE ARTHROPLASTY;  Surgeon: [name], MD;  Location: [facility];  Service: Orthopedics;  Laterality: Right;  . WRIST FRACTURE SURGERY  11/2008    There were no vitals filed for this visit.  Subjective Assessment - 02/01/18 1259    Subjective  Patient reports she has been in emotional distress which she feels like is exacerbating her pain. She said she has been unable to exercise the past 2 days d/t all of her family illness, which she feels is making her more stiff. Patient reports 8/10 pain this morning that has somewhat decreased today d/t taking her pain pill and moving around    Pertinent History  Pt is a pleasant 67 year old female s/p RTKA 1/24. Patient reports she had severe arthritis in bilat knees and multiple falls resulting in a L TKA in Jan 2017, and following R TKA this year.  Patient reports she has a  machine at home that flex/ext the knee that she uses every day and that she hashad home health therapy for the past few weeks.     Limitations  Lifting;Standing;Walking;House hold activities    How long can you sit comfortably?  1 hour    How long can you stand comfortably?  30 minutes    How long can you walk comfortably?  10 minutes    Currently in Pain?  Yes    Pain Score  6          Ther-Ex  -Bike warmup 22min for ROM during hx intake 25mins (unbilled)    Manual -Patellar mobs lateral and sup/inf GIII 30sec x4 in each direction -Supine hooklying knee flexion stretch w/ PT overpressure 6x 30sec holds -Prop ext stretch w/ PT overpressure 6x 30sec holds -AP femur mobs in supine prop position GIII 30sec bouts x8 -AP tibia mobs in supine hooklying position GIII 30sec bouts x8 -Scoot stretch with PT overpressure w/  bolster 5x 30sec holds -Manual hamstring stretch 3x 30sec -Manual calf stretch 3x 30sec  Vitals after manual therapy on machine: 91/53 pulse: 80bpm Manually:   111/67 pulse: 84bpm    Ice Pack at end of session 44min unbilled                      PT Education - 02/01/18 1303    Education provided  Yes    Education Details  exercise form; pyschological effects on physical performance    Methods  Explanation;Verbal cues;Demonstration    Comprehension  Verbalized understanding;Returned demonstration;Verbal cues required       PT Short Term Goals - 01/21/18 0959      PT SHORT TERM GOAL #1   Title  Pt will be independent with HEP in order to improve strength and balance in order to decrease fall risk and improve function at home and work.    Time  4    Status  New        PT Long Term Goals - 01/21/18 0959      PT LONG TERM GOAL #1   Title  Pt will complete 1255ft in 6MWT w/o AD to return to prior level of function to ambulate through grocery store safely    Baseline  2/15 728ft    Time  8    Period  Weeks    Status  New      PT LONG TERM GOAL #2   Title  Pt will increase LEFS by at least 9 points in order to demonstrate significant improvement in lower extremity function.     Baseline  2/15: 27/80    Time  8    Period  Weeks    Status  New      PT LONG TERM GOAL #3   Title  Patient will demonstrate L knee strength 4+/5 and hip L hip gross strength of 4/5 to return to prior level of function to complete squatting activities around the house      PT [place] #4   Title  Patient will demonstrate L knee ext to 0d and flexion to 120d in order to safely negotiate stairs at home    Baseline  2/15 L knee flex 104d L knee ext -7d            Plan - 02/01/18 1340    Clinical Impression Statement  Pt reported she was having a "very bad day today" and appeared very distressed. Pts ROM  was quite more limited today and patient reported she was feeling  very "stiff". Following increased manual therapy patient was able to obtain 0-120d with PT overpressure. Halfway through treatment patient reported feeling light headed and vitals were taken and recorded as 111/67 manually w/ pulse rate 84bpm. Pt reported this dizziness subsided quickly and that she had experienced it earlier today when her blood sugar was recorded at home as 191. Pt incision site has no noticable increased redness, was a little warm (which she reported was normal over the past few weeks), denies fever/chills/sweats/chest pain/gastroc cramping, patient does report some SOB only with increased activity, (-) Homan's (only produced calf stretch, and reports she is just under a lot of stress. PT encouraged patient that if she continues to have increased dizziness/fainting to return to MD. PT recieved second opinion from another PT in the clinic that agreed with this recommendation. D/t increased pain patient was given cryotherapy in clinic which produced pain reduction and was recommended to complete therex at home as able, as long as dizziness is not prohibiting ability to safely complete.     Clinical Impairments Affecting Rehab Potential  (-) co-morbidities, relatively sedentary lifestyle and increased pain medication (+) motivation, prior L TKA with good success    PT Frequency  2x / week    PT Duration  8 weeks    PT Treatment/Interventions  ADLs/Self Care Home Management;Electrical Stimulation;Moist Heat;Traction;Functional mobility training;Therapeutic activities;Therapeutic exercise;Neuromuscular re-education;Patient/family education;Manual techniques;Passive range of motion;Ultrasound;Cryotherapy    PT Next Visit Plan  Assess dizziness/fainting sx, knee ROM and hip strengthening; gait training    PT Home Exercise Plan  See patient instructions + clamshell exercise added today    Consulted and Agree with Plan of Care  Patient       Patient will benefit from skilled therapeutic  intervention in order to improve the following deficits and impairments:  Abnormal gait, Decreased balance, Decreased endurance, Decreased mobility, Improper body mechanics, Increased edema, Decreased range of motion, Decreased activity tolerance, Decreased strength  Visit Diagnosis: Acute pain of right knee     Problem List Patient Active Problem List   Diagnosis Date Noted  . Arthritis of knee 12/30/2017  . 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 sting   . COPD (chronic obstructive pulmonary disease) ([place]) 10/30/2016  . Gastroesophageal reflux disease 09/17/2016  . Degenerative arthritis of left knee 12/24/2015  . Memory loss 10/29/2015  . Primary osteoarthritis of right knee 06/16/2015  . Smoker 05/10/2015  . Solitary pulmonary nodule 03/28/2013  . Hypertension   . UNSPECIFIED VITAMIN D DEFICIENCY 05/29/2009  . Pure hypercholesterolemia 12/07/2007  . Major depressive disorder, recurrent episode, in partial remission ([place]) 12/07/2007  . Diabetes mellitus type 2, controlled ([place]) 08/10/2007   Shelton Silvas PT, DPT Shelton Silvas 02/01/2018, 1:50 PM  [facility] [address] [place], [address] Phone: [phone]   Fax:  [phone]  Name: RONETTE HANK MRN: [id number] Date of Birth: 10-07-1951

## 2018-02-04 ENCOUNTER — Ambulatory Visit: Payer: Medicare Other | Attending: Orthopedic Surgery | Admitting: Physical Therapy

## 2018-02-04 ENCOUNTER — Encounter: Payer: Self-pay | Admitting: Physical Therapy

## 2018-02-04 DIAGNOSIS — M25561 Pain in right knee: Secondary | ICD-10-CM | POA: Diagnosis not present

## 2018-02-04 NOTE — Therapy (Signed)
[facility] [address] [place], [address] Phone: [phone]   Fax:  [phone]  Physical Therapy Treatment  Patient Details  Name: Amanda [name] MRN: [id number] Date of Birth: Sep 14, 1951 Referring Provider: Marlou Sa, MD   Encounter Date: 02/04/2018  PT End of Session - 02/04/18 1037    Visit Number  5    Number of Visits  17    Date for PT Re-Evaluation  03/18/18    PT Start Time  1013    PT Stop Time  1059    PT Time Calculation (min)  46 min    Equipment Utilized During Treatment  Gait belt    Activity Tolerance  Patient tolerated treatment well    Behavior During Therapy  [facility] for tasks assessed/performed       Past Medical History:  Diagnosis Date  . 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 sting    bee stings  . Anxiety   . Aortic stenosis    a. 2013: nl LV sys fxn, mild MR, no evidence of pulm htn; b. TTE 8/17: EF 60-65%, no RWMA, nl LV dia fxn,  mild AS, mod AI  . COPD (chronic obstructive pulmonary disease) ([name]) 10/30/2016  . Degenerative arthritis of knee, bilateral 06/16/2015  . Depression   . Diabetes ([place])    diet managed-type 11  . GERD (gastroesophageal reflux disease)   . Hiatal hernia with gastroesophageal reflux 1997  . Hyperlipidemia   . Hypertension   . Neuromuscular disorder ([place])    nerve damage in back and shoulder  . Osteoporosis   . Palpitations    a. 03/2012: 48-hour Holter monitor showed intermittent sinus tachycardia with occasional PACs, without other significant arrhythmia  . Thyroid disease    hyperthyroidism as a teenager - received some injections age 34 that corrected this   . Tobacco abuse     Past Surgical History:  Procedure Laterality Date  . ABDOMINAL HYSTERECTOMY  1993  . BACK SURGERY    . BREAST BIOPSY Right 2007  . CHOLECYSTECTOMY    . COLONOSCOPY     30 years ago was normal per pt.   . ESOPHAGEAL DILATION     x 3  . fractured leg Right   . OVARIAN CYST REMOVAL  1972   . SALIVARY GLAND SURGERY    . TONSILLECTOMY     4 yoa  . TOTAL KNEE ARTHROPLASTY Left 12/24/2015   Procedure: LEFT TOTAL KNEE ARTHROPLASTY;  Surgeon: [name], MD;  Location: [place];  Service: Orthopedics;  Laterality: Left;  . TOTAL KNEE ARTHROPLASTY Right 12/30/2017   Procedure: RIGHT TOTAL KNEE ARTHROPLASTY;  Surgeon: [name], MD;  Location: [place];  Service: Orthopedics;  Laterality: Right;  . WRIST FRACTURE SURGERY  11/2008    There were no vitals filed for this visit.  Subjective Assessment - 02/04/18 1018    Subjective  Patient reports she has not felt very good since Wednesday. She reports she has not taken a pain pill in 2 days and reports pain not exceeding 4/10. Patient reports she is planning to go see her mother in [place] next week who is terminally ill and may have to cancel an appt. PT encouraged patient to continue HEP to not lose progress, but that we understand, and that travel is necessary.     Pertinent History  Pt is a pleasant 67 year old female s/p RTKA 1/24. Patient reports she had severe arthritis in bilat knees and  multiple falls resulting in a L TKA in Jan 2017, and following R TKA this year.  Patient reports she has a machine at home that flex/ext the knee that she uses every day and that she hashad home health therapy for the past few weeks.     Limitations  Lifting;Standing;Walking;House hold activities    How long can you sit comfortably?  1 hour    How long can you stand comfortably?  30 minutes    How long can you walk comfortably?  10 minutes    Patient Stated Goals  decrease pain     Currently in Pain?  Yes    Pain Score  4     Pain Onset  1 to 4 weeks ago           Ther-Ex -Bike for ROM warmup 4 mins (unbilled) -Stair flex/ext stretch 30sec holds 3x in each position -Standing gastroc stretch 2x 30sec  -OMEGA leg press (R LE ONLY)  3x 10 10# with cuing for eccentric control -OMEGA leg curl (R LE ONLY) 1x 10 10# > 2x 10 15# with  cuing for eccentric control -Heel raises with hover UE support at treadmill bar 3x12 w/ cuing for eccentric control and even wt distribution - Large dino disc wt shift laterally x15 and AP x15 with CGA from PT and bilat UE suport at // bar  Gait Training: patient ambulated mult gait trails 82f x 6 with decreased step length and width w/o AD. Patient was able to increase step length w/ PT cuing but continued to have decreased width reporting feeling "unsteady". Patient had excellent tibial translation/knee flex/ext/toe push off throughout gait, which she said she has been working on at home                    PT Education - 02/04/18 1036    Education provided  Yes    Education Details  Exercise form    Person(s) Educated  Patient    Methods  Explanation;Verbal cues    Comprehension  Verbalized understanding;Returned demonstration;Verbal cues required       PT Short Term Goals - 01/21/18 0959      PT SHORT TERM GOAL #1   Title  Pt will be independent with HEP in order to improve strength and balance in order to decrease fall risk and improve function at home and work.    Time  4    Status  New        PT Long Term Goals - 01/21/18 0959      PT LONG TERM GOAL #1   Title  Pt will complete 12021fin 6MWT w/o AD to return to prior level of function to ambulate through grocery store safely    Baseline  2/15 70026f  Time  8    Period  Weeks    Status  New      PT LONG TERM GOAL #2   Title  Pt will increase LEFS by at least 9 points in order to demonstrate significant improvement in lower extremity function.     Baseline  2/15: 27/80    Time  8    Period  Weeks    Status  New      PT LONG TERM GOAL #3   Title  Patient will demonstrate L knee strength 4+/5 and hip L hip gross strength of 4/5 to return to prior level of function to complete squatting activities around the house  PT LONG TERM GOAL #4   Title  Patient will demonstrate L knee ext to 0d and  flexion to 120d in order to safely negotiate stairs at home    Baseline  2/15 L knee flex 104d L knee ext -7d            Plan - 02/04/18 1045    Clinical Impression Statement  Pt is demonstrating excellent ROM, and was able to tolerate therex progression well with no pain, only muscle fatigue. Patient reported some gastroc tightness and PT showed her a stretch to add to her HEP. Patient demonstrated good balance with ambulation w/o AD and wt shifting exercise but reports not feeling confedient. PT gave patient sidelying abd to add to HEP to prevent decreased step width w/ gait to further challege patient. PT will continue to increase strengthening and balance, as ROM goals are met at this point.     Clinical Impairments Affecting Rehab Potential  (-) co-morbidities, relatively sedentary lifestyle and increased pain medication (+) motivation, prior L TKA with good success    PT Frequency  2x / week    PT Duration  8 weeks    PT Treatment/Interventions  ADLs/Self Care Home Management;Electrical Stimulation;Moist Heat;Traction;Functional mobility training;Therapeutic activities;Therapeutic exercise;Neuromuscular re-education;Patient/family education;Manual techniques;Passive range of motion;Ultrasound;Cryotherapy    PT Next Visit Plan  Assess dizziness/fainting sx, knee ROM and hip strengthening; gait training    PT Home Exercise Plan  Sidelying hip abd added 3/1; See patient instructions + clamshell exercise added     Consulted and Agree with Plan of Care  Patient       Patient will benefit from skilled therapeutic intervention in order to improve the following deficits and impairments:  Abnormal gait, Decreased balance, Decreased endurance, Decreased mobility, Improper body mechanics, Increased edema, Decreased range of motion, Decreased activity tolerance, Decreased strength  Visit Diagnosis: Acute pain of right knee     Problem List Patient Active Problem List   Diagnosis Date Noted   . Arthritis of knee 12/30/2017  . 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 sting   . COPD (chronic obstructive pulmonary disease) ([place]) 10/30/2016  . Gastroesophageal reflux disease 09/17/2016  . Degenerative arthritis of left knee 12/24/2015  . Memory loss 10/29/2015  . Primary osteoarthritis of right knee 06/16/2015  . Smoker 05/10/2015  . Solitary pulmonary nodule 03/28/2013  . Hypertension   . UNSPECIFIED VITAMIN D DEFICIENCY 05/29/2009  . Pure hypercholesterolemia 12/07/2007  . Major depressive disorder, recurrent episode, in partial remission ([place]) 12/07/2007  . Diabetes mellitus type 2, controlled ([place]) 08/10/2007   Shelton Silvas PT, DPT Shelton Silvas 02/04/2018, 11:09 AM  [facility] [address] [place], [address] Phone: [phone]   Fax:  [phone]  Name: Amanda [name] MRN: [id number] Date of Birth: 1951-11-10

## 2018-02-08 ENCOUNTER — Ambulatory Visit: Payer: Medicare Other | Admitting: Physical Therapy

## 2018-02-08 ENCOUNTER — Encounter: Payer: Self-pay | Admitting: Physical Therapy

## 2018-02-08 DIAGNOSIS — M25561 Pain in right knee: Secondary | ICD-10-CM | POA: Diagnosis not present

## 2018-02-08 NOTE — Therapy (Signed)
[facility] [address] [place], [address] Phone: [phone]   Fax:  [phone]  Physical Therapy Treatment  Patient Details  Name: Amanda [name] MRN: [id number] Date of Birth: [birth year] Referring Provider: Marlou Sa, MD   Encounter Date: 02/08/2018  PT End of Session - 02/08/18 1441    Visit Number  6    Number of Visits  17    Date for PT Re-Evaluation  03/18/18    PT Start Time  0232    PT Stop Time  0315    PT Time Calculation (min)  43 min    Activity Tolerance  Patient tolerated treatment well    Behavior During Therapy  [facility] for tasks assessed/performed       Past Medical History:  Diagnosis Date  . 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 sting    bee stings  . Anxiety   . Aortic stenosis    a. 2013: nl LV sys fxn, mild MR, no evidence of pulm htn; b. TTE 8/17: EF 60-65%, no RWMA, nl LV dia fxn,  mild AS, mod AI  . COPD (chronic obstructive pulmonary disease) ([place]) 10/30/2016  . Degenerative arthritis of knee, bilateral 06/16/2015  . Depression   . Diabetes ([place])    diet managed-type 11  . GERD (gastroesophageal reflux disease)   . Hiatal hernia with gastroesophageal reflux 1997  . Hyperlipidemia   . Hypertension   . 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 Osteoporosis   . Palpitations    a. 03/2012: 48-hour Holter monitor showed intermittent sinus tachycardia with occasional PACs, without other significant arrhythmia  . Thyroid disease    hyperthyroidism as a teenager - received some injections age 56 that corrected this   . Tobacco abuse     Past Surgical History:  Procedure Laterality Date  . ABDOMINAL HYSTERECTOMY  1993  . BACK SURGERY    . BREAST BIOPSY Right 2007  . CHOLECYSTECTOMY    . COLONOSCOPY     30 years ago was normal per pt.   . ESOPHAGEAL DILATION     x 3  . fractured leg Right   . OVARIAN CYST REMOVAL  1972  . SALIVARY GLAND SURGERY    . TONSILLECTOMY      4 yoa  . TOTAL KNEE ARTHROPLASTY Left 12/24/2015   Procedure: LEFT TOTAL KNEE ARTHROPLASTY;  Surgeon: [name], MD;  Location: [facility];  Service: Orthopedics;  Laterality: Left;  . TOTAL KNEE ARTHROPLASTY Right 12/30/2017   Procedure: RIGHT TOTAL KNEE ARTHROPLASTY;  Surgeon: [name], MD;  Location: [facility];  Service: Orthopedics;  Laterality: Right;  . WRIST FRACTURE SURGERY  11/2008    There were no vitals filed for this visit.  Subjective Assessment - 02/08/18 1435    Subjective  Patient reports she has "come off her pain medicine" other than tylenol and is therefore having increased 5/10 pain, but it is still managable. Patient reports that she has been walking around her home without the cane and feels as though she is going a good job, and does not feel unsteady on her feet. Reports she is flying out Thurs to [facility] to see her (dying) mother but that she will continue exercises. Reports compliance with HEP and no more dizzy spells.     Pertinent History  Pt is a pleasant 67 year old female s/p RTKA 1/24. Patient reports she had severe arthritis in bilat knees  and multiple falls resulting in a L TKA in Jan 2017, and following R TKA this year.  Patient reports she has a machine at home that flex/ext the knee that she uses every day and that she hashad home health therapy for the past few weeks.     Limitations  Lifting;Standing;Walking;House hold activities    How long can you sit comfortably?  1 hour    How long can you stand comfortably?  30 minutes    How long can you walk comfortably?  10 minutes    Pain Onset  1 to 4 weeks ago         Ther-Ex  -Bike for ROM warmup 4 mins (unbilled) -OMEGA leg press (R LE ONLY)  3x 10 15# with only min cuing for eccentric control -OMEGA leg curl using BLE to curl wt and R only to eccentrically allow wt to come back up 1x 10 15# > 2x 10 15# with cuing for eccentric control -Lateral step down 3x 10 with UE support on stair rail with BUE  support and cuing for full hip ext with severe fatigue noted between sets and prolonged rest required (last set patient was able to only complete 50% of motion in last 4 reps d/t fatigue) - Large dino disc wt shift laterally 2x 10 and AP 2x 10 with CGA from PT and bilat UE suport at // bar  Gait Training: patient ambulated in gait training over 26ft w/o AD. Patient was able to notice and self correct decreased step length to normalize gait. Patient had excellent tibial translation/knee flex/ext/toe push off throughout gait, which she said she has been working on at home. Patient reports feeling more steady and not feeling like she is unsteady or going to trip/fall today.                     PT Education - 02/08/18 1440    Education provided  Yes    Education Details  Exercise form; continuation of HEP     Person(s) Educated  Patient    Methods  Explanation;Verbal cues    Comprehension  Verbalized understanding;Returned demonstration;Verbal cues required       PT Short Term Goals - 01/21/18 0959      PT SHORT TERM GOAL #1   Title  Pt will be independent with HEP in order to improve strength and balance in order to decrease fall risk and improve function at home and work.    Time  4    Status  New        PT Long Term Goals - 01/21/18 0959      PT LONG TERM GOAL #1   Title  Pt will complete 1266ft in 6MWT w/o AD to return to prior level of function to ambulate through grocery store safely    Baseline  2/15 757ft    Time  8    Period  Weeks    Status  New      PT LONG TERM GOAL #2   Title  Pt will increase LEFS by at least 9 points in order to demonstrate significant improvement in lower extremity function.     Baseline  2/15: 27/80    Time  8    Period  Weeks    Status  New      PT LONG TERM GOAL #3   Title  Patient will demonstrate L knee strength 4+/5 and hip L hip gross strength of 4/5 to return  to prior level of function to complete squatting activities  around the house      PT [place] #4   Title  Patient will demonstrate L knee ext to 0d and flexion to 120d in order to safely negotiate stairs at home    Baseline  2/15 L knee flex 104d L knee ext -7d            Plan - 02/08/18 1542    Clinical Impression Statement  Patient is continuing to demonstrate excellent ROM and strength gains with fatigue with therex progression, but no pain. Patient's balance is improving as well, and she is able to self-correct her gait to near 100% normalized. Patient reports she is going to [place] and does not know when she will return. PT and pt discussed her continuing her HEP and moving the knee as much as possible,and to keep PT updated on travel plans, which she will agree to.      Rehab Potential  Fair    Clinical Impairments Affecting Rehab Potential  (-) co-morbidities, relatively sedentary lifestyle and increased pain medication (+) motivation, prior L TKA with good success    PT Frequency  2x / week    PT Duration  8 weeks    PT Treatment/Interventions  ADLs/Self Care Home Management;Electrical Stimulation;Moist Heat;Traction;Functional mobility training;Therapeutic activities;Therapeutic exercise;Neuromuscular re-education;Patient/family education;Manual techniques;Passive range of motion;Ultrasound;Cryotherapy    PT Next Visit Plan  Assess dizziness/fainting sx, knee ROM and hip strengthening; gait training    PT Home Exercise Plan  Sidelying hip abd added 3/1; See patient instructions + clamshell exercise added     Consulted and Agree with Plan of Care  Patient       Patient will benefit from skilled therapeutic intervention in order to improve the following deficits and impairments:  Abnormal gait, Decreased balance, Decreased endurance, Decreased mobility, Improper body mechanics, Increased edema, Decreased range of motion, Decreased activity tolerance, Decreased strength  Visit Diagnosis: Acute pain of right knee     Problem  List Patient Active Problem List   Diagnosis Date Noted  . Arthritis of knee 12/30/2017  . 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 sting   . COPD (chronic obstructive pulmonary disease) ([place]) 10/30/2016  . Gastroesophageal reflux disease 09/17/2016  . Degenerative arthritis of left knee 12/24/2015  . Memory loss 10/29/2015  . Primary osteoarthritis of right knee 06/16/2015  . Smoker 05/10/2015  . Solitary pulmonary nodule 03/28/2013  . Hypertension   . UNSPECIFIED VITAMIN D DEFICIENCY 05/29/2009  . Pure hypercholesterolemia 12/07/2007  . Major depressive disorder, recurrent episode, in partial remission ([place]) 12/07/2007  . Diabetes mellitus type 2, controlled ([place]) 08/10/2007   Shelton Silvas PT, DPT Shelton Silvas 02/08/2018, 3:52 PM  [facility] [address] [place], [address] Phone: [phone]   Fax:  [phone]  Name: Amanda [name] MRN: [id number] Date of Birth: Apr 24, 1951

## 2018-02-09 ENCOUNTER — Other Ambulatory Visit: Payer: Self-pay | Admitting: *Deleted

## 2018-02-09 ENCOUNTER — Telehealth: Payer: Self-pay | Admitting: Family Medicine

## 2018-02-09 MED ORDER — LANSOPRAZOLE 30 MG PO CPDR: 30.0000 mg | DELAYED_RELEASE_CAPSULE | Freq: Every day | ORAL | 3 refills | Status: DC

## 2018-02-09 NOTE — Telephone Encounter (Addendum)
Left message for Amanda [name] that there is going to be no way Dr. Lorelei Pont can know what is covered by her insurance. He recommends usingover the counter generic omeprazole.  It would be the cheapest OTC alternative.

## 2018-02-09 NOTE — Telephone Encounter (Signed)
Copied from [place] [id number]. Topic: Quick Communication - Rx Refill/Question >> Feb 09, 2018  3:58 PM Cecelia Byars, NT wrote: Medication:  lansoprazole (PREVACID) 30 MG capsule  Has the patient contacted their pharmacy? yes  (Agent: If no, request that the patient contact the pharmacy for the refill. Preferred Pharmacy (with phone number or street name): [facility] ,[phone]  fax [phone] Agent: Please be advised that RX refills may take up to 3 business days. We ask that you follow-up with your pharmacy. Patient called and said that she was unable to get the above prescription due to price and would like something  covered by the insurance and she is leaving town in the morning  please advise

## 2018-02-09 NOTE — Telephone Encounter (Signed)
Received fax from [facility] stating insurance prefers omeprazole, pantoprazole and dexilant.  Do you want to send in Rx?

## 2018-02-09 NOTE — Telephone Encounter (Signed)
There is going to be no way I can know what is covered by her insurance.   Over the counter generic omeprazole would be the cheapest OTC alternative.

## 2018-02-10 ENCOUNTER — Ambulatory Visit: Payer: Medicare Other | Admitting: Physical Therapy

## 2018-02-10 ENCOUNTER — Ambulatory Visit (INDEPENDENT_AMBULATORY_CARE_PROVIDER_SITE_OTHER): Payer: Medicare Other | Admitting: Orthopedic Surgery

## 2018-02-10 MED ORDER — PANTOPRAZOLE SODIUM 40 MG PO TBEC: DELAYED_RELEASE_TABLET | ORAL | 3 refills | Status: DC

## 2018-02-10 NOTE — Telephone Encounter (Signed)
Please  protonix generic, 40 mg, 1 po 30 mins before breakfast, #90, 3 refills

## 2018-02-10 NOTE — Telephone Encounter (Signed)
Left message for Amanda [name] that [facility] did send [place] a list of preferred medications and Dr. Lorelei Pont has sent her a prescription for Protonix to [place].  I advised if she would like me to send a Rx to [place] while she is there taking care of her mother, to call me back with name, address and phone number of a pharmacy.

## 2018-02-10 NOTE — Addendum Note (Signed)
Addended by: Carter Kitten on: 02/10/2018 02:55 PM   Modules accepted: Orders

## 2018-02-14 ENCOUNTER — Ambulatory Visit: Payer: Medicare Other | Admitting: Physical Therapy

## 2018-02-17 ENCOUNTER — Ambulatory Visit: Payer: Medicare Other | Admitting: Physical Therapy

## 2018-02-18 DIAGNOSIS — H6980 Other specified disorders of Eustachian tube, unspecified ear: Secondary | ICD-10-CM | POA: Diagnosis not present

## 2018-02-18 DIAGNOSIS — H902 Conductive hearing loss, unspecified: Secondary | ICD-10-CM | POA: Diagnosis not present

## 2018-02-18 DIAGNOSIS — J302 Other seasonal allergic rhinitis: Secondary | ICD-10-CM | POA: Diagnosis not present

## 2018-02-21 ENCOUNTER — Ambulatory Visit: Payer: Medicare Other | Admitting: Physical Therapy

## 2018-02-23 ENCOUNTER — Ambulatory Visit: Payer: Medicare Other | Admitting: Physical Therapy

## 2018-02-28 ENCOUNTER — Ambulatory Visit: Payer: Medicare Other | Admitting: Physical Therapy

## 2018-03-02 ENCOUNTER — Ambulatory Visit: Payer: Medicare Other | Admitting: Physical Therapy

## 2018-03-07 ENCOUNTER — Ambulatory Visit: Payer: Medicare Other | Admitting: Physical Therapy

## 2018-03-10 ENCOUNTER — Ambulatory Visit: Payer: Medicare Other | Admitting: Physical Therapy

## 2018-03-31 ENCOUNTER — Other Ambulatory Visit: Payer: Self-pay | Admitting: *Deleted

## 2018-03-31 MED ORDER — FLUOXETINE HCL 40 MG PO CAPS: 40.0000 mg | ORAL_CAPSULE | Freq: Two times a day (BID) | ORAL | 0 refills | Status: DC

## 2018-04-15 ENCOUNTER — Encounter: Payer: Self-pay | Admitting: Neurology

## 2018-04-15 ENCOUNTER — Encounter

## 2018-04-15 ENCOUNTER — Ambulatory Visit (INDEPENDENT_AMBULATORY_CARE_PROVIDER_SITE_OTHER): Payer: Medicare Other | Admitting: Neurology

## 2018-04-15 VITALS — BP 112/66 | HR 78 | Ht 68.0 in | Wt 209.0 lb

## 2018-04-15 DIAGNOSIS — R2681 Unsteadiness on feet: Secondary | ICD-10-CM

## 2018-04-15 DIAGNOSIS — G44209 Tension-type headache, unspecified, not intractable: Secondary | ICD-10-CM | POA: Diagnosis not present

## 2018-04-15 DIAGNOSIS — R292 Abnormal reflex: Secondary | ICD-10-CM | POA: Diagnosis not present

## 2018-04-15 DIAGNOSIS — M542 Cervicalgia: Secondary | ICD-10-CM

## 2018-04-15 DIAGNOSIS — G629 Polyneuropathy, unspecified: Secondary | ICD-10-CM

## 2018-04-15 NOTE — Progress Notes (Signed)
NEUROLOGY FOLLOW UP OFFICE NOTE  Amanda [name] [id number]  DOB: May 14, 1951  HISTORY OF PRESENT ILLNESS: I had the pleasure of seeing Amanda [name] in follow-up in the neurology clinic on 04/15/2018.  The patient was last seen a year ago for left facial pain/headaches. She is tolerating amitriptyline 166m qhs without side effects but continues to have the headaches around 2-3 times a week and "funny feeling" on the left side of her head, worse when standing up. She had reported falls on her last visit, but reports an increase in falls with 3 occurring while [place] was [place] When she gets up in the morning, she notices she leans to the left side. If she turns to her left side, she would fall on that side. She has noticed that even trying to get a sock on her left foot makes her lean to the left. She has occasional numbness and tingling in both feet, mostly when crossing her legs. She has noticed tremors in both hands affecting her writing. She has occasional left-sided neck pain. She has also noticed memory issues with trouble remembering words. She can see what she wants to say but cannot find the word or says a different word instead. She has low back pain and right knee pain (post-surgery). She reports being under a lot of stress recently.   HPI 10/25/2015: This is a pleasant 67yo RH woman with a history of hypertension, hyperlipidemia, diet-controlled diabetes, neuropathy, anxiety, who presented for evaluation of worsening memory and headaches. She started noticing worsening memory changes since last summer. She feels foggy a lot of times and feels her memory is "off the charts." She cannot recall conversations from 15 minutes prior. She has had trouble with long-term memory as well, and her family is concerned this is due to taking Prozac for many years. She feels her depression is under control as long as she takes medication. She lives with her husband and daughter, who have  noticed memory changes as well. Her daughter took over bill payments 1-1/2 years ago. She reports overall compliance with medications. She denies getting lost driving. She has occasional word-finding difficulties. There is no family history of dementia. She has had several falls and head injuries over the years. She denies any significant alcohol intake.   She reports headaches started around 2 years ago after she had a bad fall in school and fell on her knees with her head hitting the door. She has pain mostly over the left side of her head, with some tingling over the left temporal region. She feels she has to rub it to feel better. She reports the left side of her head "just feels strange," tight, as if something is under her skin making it feel tight and tender. She also reports a sore spot over the vertex. Some days her headaches are like a toothache, she feels they are like a muscle ache and rubbing it helps but sometimes tender to touch. Pain occurs around 3 times a week, lasting 2-3 days. She feels there has been deformity changes over her forehead, early this year it looked like there was a big egg on her forehead, and she now feels a ridge over her forehead. She denies any diplopia, dysarthria, dysphagia, but has occasional pain with chewing over the left jaw and back of her ear. ESR and CRP normal. She has neck and back pain, sometimes her whole left side hurts. She has had chronic spinal nerve damage since  her back surgery, with stinging/burning pain that amitriptyline helps with. She has occasional numbness in both feet and bilateral leg pain, as if her "bones are bruised." Taking magnesium has helped some with this. Her mother and sister have migraines.   I personally reviewed MRI brain with and without contrast done 09/20/15 which did not show any acute changes, there was mild chronic microvascular disease.  PAST MEDICAL HISTORY: Past Medical History:  Diagnosis Date  . 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 sting      bee stings  . Anxiety   . Aortic stenosis    a. 2013: nl LV sys fxn, mild MR, no evidence of pulm htn; b. TTE 8/17: EF 60-65%, no RWMA, nl LV dia fxn,  mild AS, mod AI  . COPD (chronic obstructive pulmonary disease) ([place]) 10/30/2016  . Degenerative arthritis of knee, bilateral 06/16/2015  . Depression   . Diabetes ([place])    diet managed-type 11  . GERD (gastroesophageal reflux disease)   . Hiatal hernia with gastroesophageal reflux 1997  . Hyperlipidemia   . Hypertension   . Neuromuscular disorder ([place])    nerve damage in back and shoulder  . Osteoporosis   . Palpitations    a. 03/2012: 48-hour Holter monitor showed intermittent sinus tachycardia with occasional PACs, without other significant arrhythmia  . Thyroid disease    hyperthyroidism as a teenager - received some injections age 67 that corrected this   . Tobacco abuse     MEDICATIONS:  Outpatient Encounter Medications as of 04/15/2018  Medication Sig  . albuterol (PROVENTIL HFA;VENTOLIN HFA) 108 (90 Base) MCG/ACT inhaler Inhale 2 puffs into the lungs every 4 (four) hours as needed for wheezing or shortness of breath.  Marland Kitchen amitriptyline (ELAVIL) 50 MG tablet TAKE TWO TABLETS BY MOUTH AT NIGHT  . Ascorbic Acid (VITAMIN C) 1000 MG tablet Take 1,000 mg by mouth daily.  Marland Kitchen atorvastatin (LIPITOR) 40 MG tablet TAKE ONE TABLET BY MOUTH ONCE DAILY  . buPROPion (WELLBUTRIN XL) 150 MG 24 hr tablet Take 1 tablet (150 mg total) by mouth daily.  . cetirizine (ZYRTEC) 10 MG chewable tablet Chew 10 mg by mouth daily.  . Cholecalciferol (VITAMIN D) 1000 UNITS capsule Take 1,000 Units by mouth 2 (two) times daily.   . clonazePAM (KLONOPIN) 0.5 MG tablet TAKE 1 TABLET BY MOUTH TWICE DAILY AS NEEDED  . cyclobenzaprine (FLEXERIL) 10 MG tablet TAKE ONE TABLET BY MOUTH THREE TIMES DAILY AS NEEDED FOR MUSCLE SPASM  . FLUoxetine (PROZAC) 40 MG capsule Take 1 capsule (40 mg total) by mouth 2 (two) times daily.  . lansoprazole (PREVACID) 30 MG capsule  Take 1 capsule (30 mg total) by mouth daily at 12 noon.  . magnesium oxide (MAG-OX) 400 MG tablet Take 400 mg by mouth daily.  . metoprolol tartrate (LOPRESSOR) 25 MG tablet Take 0.5 tablets (12.5 mg total) by mouth 2 (two) times daily.  . Multiple Vitamin (MULTIVITAMIN) tablet Take 1 tablet by mouth daily.  Marland Kitchen oxycodone (OXY-IR) 5 MG capsule 1 po q 4-6hrs prn  . pantoprazole (PROTONIX) 40 MG tablet Take one tablet by mouth 30 minutes prior to breakfast.  . polycarbophil (EQ FIBER THERAPY) 625 MG tablet Take 625 mg by mouth daily. FIBER THERAPY  . rivaroxaban (XARELTO) 10 MG TABS tablet Take 1 tablet (10 mg total) by mouth daily with breakfast.   No facility-administered encounter medications on file as of 04/15/2018.     ALLERGIES: Allergies  Allergen Reactions  . Diazepam Other (See Comments)  REACTION: pt states she gets very angry and abusive verbally on this medication     FAMILY HISTORY: Family History  Problem Relation Age of Onset  . Hyperlipidemia Mother   . Arthritis Mother   . Diabetes Mother   . Heart disease Mother   . Colon polyps Mother   . Cancer Paternal Grandmother        breast  . Hypercholesterolemia Sister   . Colon polyps Sister   . Breast cancer Sister   . Colon cancer Neg Hx   . Esophageal cancer Neg Hx   . Rectal cancer Neg Hx   . Stomach cancer Neg Hx   . Pancreatic cancer Neg Hx     SOCIAL HISTORY: Social History   Socioeconomic History  . Marital status: Married    Spouse name: Not on file  . Number of children: 2  . Years of education: Not on file  . Highest education level: Not on file  Occupational History  . Occupation: Chief Executive Officer: [employer]  Social Needs  . Financial resource strain: Not on file  . Food insecurity:    Worry: Not on file    Inability: Not on file  . Transportation needs:    Medical: Not on file    Non-medical: Not on file  Tobacco Use  . Smoking status:  Current Every Day Smoker    Packs/day: 0.75    Years: 45.00    Pack years: 33.75    Types: Cigarettes  . Smokeless tobacco: Never Used  . Tobacco comment: trying to quit before knee surgery  Substance and Sexual Activity  . Alcohol use: Yes    Alcohol/week: 0.0 oz    Comment: glass of wine twice a month   . Drug use: No  . Sexual activity: Not on file  Lifestyle  . Physical activity:    Days per week: Not on file    Minutes per session: Not on file  . Stress: Not on file  Relationships  . Social connections:    Talks on phone: Not on file    Gets together: Not on file    Attends religious service: Not on file    Active member of club or organization: Not on file    Attends meetings of clubs or organizations: Not on file    Relationship status: Not on file  . Intimate partner violence:    Fear of current or ex partner: Not on file    Emotionally abused: Not on file    Physically abused: Not on file    Forced sexual activity: Not on file  Other Topics Concern  . Not on file  Social History Narrative  . Not on file    REVIEW OF SYSTEMS: Constitutional: No fevers, chills, or sweats, no generalized fatigue, change in appetite Eyes: No visual changes, double vision, eye pain Ear, nose and throat: No hearing loss, ear pain, nasal congestion, sore throat Cardiovascular: No chest pain, palpitations Respiratory:  No shortness of breath at rest or with exertion, wheezes GastrointestinaI: No nausea, vomiting, diarrhea, abdominal pain, fecal incontinence Genitourinary:  No dysuria, urinary retention or frequency Musculoskeletal:  + neck pain, back pain Integumentary: No rash, pruritus, skin lesions Neurological: as above Psychiatric: No depression, insomnia, anxiety Endocrine: No palpitations, fatigue, diaphoresis, mood swings, change in appetite, change in weight, increased thirst Hematologic/Lymphatic:  No anemia, purpura, petechiae. Allergic/Immunologic: no itchy/runny eyes,  nasal congestion, recent allergic reactions, rashes  PHYSICAL EXAM: Vitals:  04/15/18 1602  BP: 112/66  Pulse: 78  SpO2: 96%   General: No acute distress Head:  Normocephalic/atraumatic Neck: supple, no paraspinal tenderness, full range of motion Heart:  Regular rate and rhythm Lungs:  Clear to auscultation bilaterally Back: No paraspinal tenderness Skin/Extremities: No rash, no edema Neurological Exam: alert and oriented to person, place, and time. No aphasia or dysarthria. Fund of knowledge is appropriate.  Recent and remote memory are intact.  Attention and concentration are normal.    Able to name objects and repeat phrases. Cranial nerves: Pupils equal, round, reactive to light. Extraocular movements intact with no nystagmus. Visual fields full. Intact facial sensation to light touch. No facial asymmetry. Tongue, uvula, palate midline.  Motor: Bulk and tone normal, muscle strength 5/5 throughout with no pronator drift.  Sensation intact to all modalities on both UE, decreased pin, cold, vibration sense to ankles bilaterally. No extinction to double simultaneous stimulation.  Deep tendon reflexes 2+ throughout, toes downgoing.  Finger to nose testing intact.  Gait narrow-based and steady, difficulty with tandem walk.  Romberg positive sway.  IMPRESSION: This is a pleasant 67 yo RH woman with a history of hypertension, hyperlipidemia, diet-controlled diabetes, neuropathy, with headaches localized over the left hemisphere suggestive of tension-type headaches. MRI brain unremarkable. She continues to report 2-3 headaches a week on amitriptyline 13m qhs, no side effects. She is hesitant to increase dose or add on another medication. She is reporting new symptoms of increasing falls and gait instability, leaning toward the left. Neurological exam shows evidence of a length-dependent neuropathy. MRI cervical spine without contrast will be ordered to assess for spinal stenosis. EMG/NCV of the left  LE will be ordered to further evaluate symptoms. She will follow-up after the tests and knows to call for any changes.   Thank you for allowing me to participate in her care.  Please do not hesitate to call for any questions or concerns.  The duration of this appointment visit was 30 minutes of face-to-face time with the patient.  Greater than 50% of this time was spent in counseling, explanation of diagnosis, planning of further management, and coordination of care.   KEllouise Newer M.D.   CC: Dr. CLorelei Pont

## 2018-04-15 NOTE — Patient Instructions (Signed)
1. Schedule MRI cervical spine without contrast 2. Schedule EMG/NCV of the left LE with Dr. Posey Pronto 3. Continue amitriptyline 100mg  at night 4. Follow-up after tests

## 2018-04-15 NOTE — Progress Notes (Signed)
Called radiology scheduling, spoke with Bary Castilla, she will contact Pt to schedule MRI c-spine w/o at [facility].

## 2018-04-19 ENCOUNTER — Ambulatory Visit (INDEPENDENT_AMBULATORY_CARE_PROVIDER_SITE_OTHER): Payer: Medicare Other | Admitting: Neurology

## 2018-04-19 DIAGNOSIS — G629 Polyneuropathy, unspecified: Secondary | ICD-10-CM

## 2018-04-19 NOTE — Procedures (Signed)
[facility]  [address], [place]  [place], [place] [address] Tel: [phone] Fax:  [phone] Test Date:  04/19/2018  Patient: Amanda [name] DOB: [birth year] Physician: Narda Amber, DO  Sex: Female Height: 5\' 8"  Ref Phys: Ellouise Newer, MD  ID#: [id number] Temp: 37.0C Technician:    Patient Complaints: This is a 67 year old female referred for evaluation of neuropathy and gait imbalance.  NCV & EMG Findings: Extensive electrodiagnostic testing of the left lower extremity shows:  1. Left sural sensory response is within normal limits. Bilateral superficial peroneal sensory responses are absent. 2. Left peroneal and tibial motor responses are within normal limits. 3. Left tibial H reflex study is within normal limits. 4. There is no evidence of active or chronic motor axon loss changes affecting any of the tested muscles. Motor unit configuration and recruitment pattern is within normal limits.  Impression: The electrophysiologic findings are consistent with a chronic sensory polyneuropathy affecting the lower extremities, mild to moderate in degree electrically.   ___________________________ Narda Amber, DO    Nerve Conduction Studies Anti Sensory Summary Table   Site NR Peak (ms) Norm Peak (ms) P-T Amp (V) Norm P-T Amp  Left Sup Peroneal Anti Sensory (Ant Lat Mall)  37C  12 cm NR  <4.6  >3  Right Sup Peroneal Anti Sensory (Ant Lat Mall)  37C  12 cm NR  <4.6  >3  Left Sural Anti Sensory (Lat Mall)  37C  Calf    4.2 <4.6 4.2 >3   Motor Summary Table   Site NR Onset (ms) Norm Onset (ms) O-P Amp (mV) Norm O-P Amp Site1 Site2 Delta-0 (ms) Dist (cm) Vel (m/s) Norm Vel (m/s)  Left Peroneal Motor (Ext Dig Brev)  37C  Ankle    3.2 <6.0 2.6 >2.5 B Fib Ankle 8.3 38.0 46 >40  B Fib    11.5  2.6  Poplt B Fib 1.1 8.0 73 >40  Poplt    12.6  2.5         Left Peroneal TA Motor (Tib Ant)  37C  Fib Head    2.4 <4.5 3.0 >3 Poplit Fib Head 1.4 8.0 57 >40    Poplit    3.8  2.9         Left Tibial Motor (Abd Hall Brev)  37C  Ankle    3.7 <6.0 4.9 >4 Knee Ankle 8.2 43.0 52 >40  Knee    11.9  3.0          H Reflex Studies   NR H-Lat (ms) Lat Norm (ms) L-R H-Lat (ms)  Left Tibial (Gastroc)  37C     34.69 <35    EMG   Side Muscle Ins Act Fibs Psw Fasc Number Recrt Dur Dur. Amp Amp. Poly Poly. Comment  Left AntTibialis Nml Nml Nml Nml Nml Nml Nml Nml Nml Nml Nml Nml N/A  Left Gastroc Nml Nml Nml Nml Nml Nml Nml Nml Nml Nml Nml Nml N/A  Left Flex Dig Long Nml Nml Nml Nml Nml Nml Nml Nml Nml Nml Nml Nml N/A  Left RectFemoris Nml Nml Nml Nml Nml Nml Nml Nml Nml Nml Nml Nml N/A  Left GluteusMed Nml Nml Nml Nml Nml Nml Nml Nml Nml Nml Nml Nml N/A      Waveforms:

## 2018-04-28 ENCOUNTER — Telehealth: Payer: Self-pay

## 2018-04-28 NOTE — Telephone Encounter (Signed)
-----   Message from Cameron Sprang, MD sent at 04/25/2018  2:59 PM EDT ----- Pls let her know the nerve test confirmed neuropathy which can cause imbalance and falls. Proceed with MRI cervical spine then we will schedule follow-up to discuss results and next steps. Thanks

## 2018-04-28 NOTE — Telephone Encounter (Signed)
Pt made aware of appointment

## 2018-04-28 NOTE — Telephone Encounter (Signed)
Called centralized scheduling - pt scheduled for June 6th at 2:00.

## 2018-04-28 NOTE — Telephone Encounter (Signed)
Pt returned my call.  Relayed message below.  Pt states that she has not heard from [facility] about MRI - will call centralized scheduling now to set that up.

## 2018-04-28 NOTE — Telephone Encounter (Signed)
LMOM asking for return call to the office.  Did not relay message below.   

## 2018-05-09 ENCOUNTER — Ambulatory Visit (INDEPENDENT_AMBULATORY_CARE_PROVIDER_SITE_OTHER): Payer: Medicare Other | Admitting: Orthopedic Surgery

## 2018-05-09 ENCOUNTER — Encounter (INDEPENDENT_AMBULATORY_CARE_PROVIDER_SITE_OTHER): Payer: Self-pay | Admitting: Orthopedic Surgery

## 2018-05-09 DIAGNOSIS — Z96651 Presence of right artificial knee joint: Secondary | ICD-10-CM

## 2018-05-10 ENCOUNTER — Encounter (INDEPENDENT_AMBULATORY_CARE_PROVIDER_SITE_OTHER): Payer: Self-pay | Admitting: Orthopedic Surgery

## 2018-05-10 NOTE — Progress Notes (Signed)
Post-Op Visit Note   Patient: Amanda [name]           Date of Birth: July 05, 1951           MRN: [id number] Visit Date: 05/09/2018 PCP: Owens Loffler, MD   Assessment & Plan:  Chief Complaint:  Chief Complaint  Patient presents with  . Right Knee - Follow-up   Visit Diagnoses:  1. Status post total right knee replacement     Plan: Amanda [name] is a patient underwent right total knee replacement in January.  She missed her 2-month checkup.  She is having some falls and that is getting worked up by a neurologist.  She has occasional pain in the knee but she is otherwise doing well.  On examination she has full extension and about 120 of flexion.  No effusion.  Quad strength is good.  Pression is patient is doing well following knee replacement.  I will see her back as needed.  Follow-Up Instructions: Return if symptoms worsen or fail to improve.   Orders:  No orders of the defined types were placed in this encounter.  No orders of the defined types were placed in this encounter.   Imaging: No results found.  PMFS History: Patient Active Problem List   Diagnosis Date Noted  . Arthritis of knee 12/30/2017  . 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 sting   . COPD (chronic obstructive pulmonary disease) ([place]) 10/30/2016  . Gastroesophageal reflux disease 09/17/2016  . Degenerative arthritis of left knee 12/24/2015  . Memory loss 10/29/2015  . Primary osteoarthritis of right knee 06/16/2015  . Smoker 05/10/2015  . Solitary pulmonary nodule 03/28/2013  . Hypertension   . UNSPECIFIED VITAMIN D DEFICIENCY 05/29/2009  . Pure hypercholesterolemia 12/07/2007  . Major depressive disorder, recurrent episode, in partial remission ([place]) 12/07/2007  . Diabetes mellitus type 2, controlled ([place]) 08/10/2007   Past Medical History:  Diagnosis Date  . 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 sting    bee stings  . Anxiety   . Aortic stenosis    a. 2013: nl LV sys fxn, mild MR, no evidence of pulm htn; b. TTE 8/17: EF 60-65%, no  RWMA, nl LV dia fxn,  mild AS, mod AI  . COPD (chronic obstructive pulmonary disease) ([place]) 10/30/2016  . Degenerative arthritis of knee, bilateral 06/16/2015  . Depression   . Diabetes ([place])    diet managed-type 11  . GERD (gastroesophageal reflux disease)   . Hiatal hernia with gastroesophageal reflux 1997  . Hyperlipidemia   . Hypertension   . Neuromuscular disorder ([place])    nerve damage in back and shoulder  . Osteoporosis   . Palpitations    a. 03/2012: 48-hour Holter monitor showed intermittent sinus tachycardia with occasional PACs, without other significant arrhythmia  . Thyroid disease    hyperthyroidism as a teenager - received some injections age 12 that corrected this   . Tobacco abuse     Family History  Problem Relation Age of Onset  . Hyperlipidemia Mother   . Arthritis Mother   . Diabetes Mother   . Heart disease Mother   . Colon polyps Mother   . Cancer Paternal Grandmother        breast  . Hypercholesterolemia Sister   . Colon polyps Sister   . Breast cancer Sister   . Colon cancer Neg Hx   . Esophageal cancer Neg Hx   . Rectal cancer Neg Hx   . Stomach cancer Neg Hx   . Pancreatic cancer Neg Hx  Past Surgical History:  Procedure Laterality Date  . ABDOMINAL HYSTERECTOMY  1993  . BACK SURGERY    . BREAST BIOPSY Right 2007  . CHOLECYSTECTOMY    . COLONOSCOPY     30 years ago was normal per pt.   . ESOPHAGEAL DILATION     x 3  . fractured leg Right   . OVARIAN CYST REMOVAL  1972  . SALIVARY GLAND SURGERY    . TONSILLECTOMY     4 yoa  . TOTAL KNEE ARTHROPLASTY Left 12/24/2015   Procedure: LEFT TOTAL KNEE ARTHROPLASTY;  Surgeon: [name], MD;  Location: [facility];  Service: Orthopedics;  Laterality: Left;  . TOTAL KNEE ARTHROPLASTY Right 12/30/2017   Procedure: RIGHT TOTAL KNEE ARTHROPLASTY;  Surgeon: [name], MD;  Location: [facility];  Service: Orthopedics;  Laterality: Right;  . WRIST FRACTURE SURGERY  11/2008   Social History    Occupational History  . Occupation: Chief Executive Officer: [employer]  Tobacco Use  . Smoking status: Current Every Day Smoker    Packs/day: 0.75    Years: 45.00    Pack years: 33.75    Types: Cigarettes  . Smokeless tobacco: Never Used  . Tobacco comment: trying to quit before knee surgery  Substance and Sexual Activity  . Alcohol use: Yes    Alcohol/week: 0.0 oz    Comment: glass of wine twice a month   . Drug use: No  . Sexual activity: Not on file

## 2018-05-12 ENCOUNTER — Ambulatory Visit
Admission: RE | Admit: 2018-05-12 | Discharge: 2018-05-12 | Disposition: A | Discharge status: Home / Self Care | Payer: Medicare Other | Source: Ambulatory Visit | Attending: Neurology | Admitting: Neurology

## 2018-05-12 DIAGNOSIS — M542 Cervicalgia: Secondary | ICD-10-CM | POA: Insufficient documentation

## 2018-05-12 DIAGNOSIS — S199XXA Unspecified injury of neck, initial encounter: Secondary | ICD-10-CM | POA: Diagnosis not present

## 2018-05-12 DIAGNOSIS — M2578 Osteophyte, vertebrae: Secondary | ICD-10-CM | POA: Diagnosis not present

## 2018-05-12 DIAGNOSIS — R2681 Unsteadiness on feet: Secondary | ICD-10-CM

## 2018-05-12 DIAGNOSIS — M4802 Spinal stenosis, cervical region: Secondary | ICD-10-CM | POA: Diagnosis not present

## 2018-05-12 DIAGNOSIS — M47812 Spondylosis without myelopathy or radiculopathy, cervical region: Secondary | ICD-10-CM | POA: Insufficient documentation

## 2018-05-12 DIAGNOSIS — M50223 Other cervical disc displacement at C6-C7 level: Secondary | ICD-10-CM | POA: Insufficient documentation

## 2018-06-13 ENCOUNTER — Other Ambulatory Visit: Payer: Self-pay | Admitting: Family Medicine

## 2018-06-13 NOTE — Telephone Encounter (Signed)
Name of Medication: Clonazepam Name of Pharmacy: [facility] or Written Date and Quantity: 12/13/2017 for #30 with 3 refills Last Office Visit and Type: 01/12/2018 Osteoarthritis Next Office Visit and Type: No future appointments Last Controlled Substance Agreement Date: 05/03/2014 Last UDS: 05/03/2014

## 2018-06-21 ENCOUNTER — Telehealth: Payer: Self-pay | Admitting: Neurology

## 2018-06-21 NOTE — Telephone Encounter (Signed)
Returned call to pt.  No answer.  LMOM explaining that I have called to attempt to relay MRI results.  Pt does not necessarily need appointment to go over results.  I can give them over the phone.  Asked for return call.

## 2018-06-21 NOTE — Telephone Encounter (Signed)
Patient is needing a follow up with Dr. Delice Lesch for her MRI results. Is there a spot available so she can get in before March? Please Advise. Thanks

## 2018-06-28 ENCOUNTER — Other Ambulatory Visit: Payer: Self-pay | Admitting: Family Medicine

## 2018-07-13 ENCOUNTER — Other Ambulatory Visit: Payer: Self-pay | Admitting: Family Medicine

## 2018-07-25 ENCOUNTER — Ambulatory Visit: Payer: Self-pay | Admitting: Family Medicine

## 2018-07-25 NOTE — Telephone Encounter (Signed)
She called in c/o being short of breath since August 15th.   "I am a smoker and I have to go outside the house to smoke because my husband has a heart condition".    "I think the heat and humidity are bothering me".   "I've been monitoring my glucose and BP too".    See below for readings.    "Sometimes my legs feel heavy".    She denies them being swollen.    "The main reason I'm calling is because my husband said I needed to call".      See triage notes.  I scheduled her with Dr. Frederico Hamman Copland for 07/27/18 at 10:40.    I instructed her to go to the ED if her breathing became worse or labored.     Reason for Disposition . [1] MODERATE longstanding difficulty breathing (e.g., speaks in phrases, SOB even at rest, pulse 100-120) AND [2] SAME as normal  Answer Assessment - Initial Assessment Questions 1. RESPIRATORY STATUS: "Describe your breathing?" (e.g., wheezing, shortness of breath, unable to speak, severe coughing)      I'm been having shortness of breath since the 15th.   2. ONSET: "When did this breathing problem begin?"      August 15th.     I've been checking my BP and glucose... 89/60 BP on 15th.   My pulse 90.  GLucose 115.    On 16th   146/96, pulse 77 and glucose 159.  Yesterday 138/96, glucose 139, pulse 79.  Type II-no medications for it. 3. PATTERN "Does the difficult breathing come and go, or has it been constant since it started?"      The shortness of breath is constant this past week.   I wonder if it was from the hot weather.    I used to have a inhaler.    I may need another one.  My legs feel heavy.   Not swollen.   Sometimes my ankles swell when I'm on my feet a lot.     4. SEVERITY: "How bad is your breathing?" (e.g., mild, moderate, severe)    - MILD: No SOB at rest, mild SOB with walking, speaks normally in sentences, can lay down, no retractions, pulse < 100.    - MODERATE: SOB at rest, SOB with minimal exertion and prefers to sit, cannot lie down flat, speaks in  phrases, mild retractions, audible wheezing, pulse 100-120.    - SEVERE: Very SOB at rest, speaks in single words, struggling to breathe, sitting hunched forward, retractions, pulse > 120      It's been very humid and I smoke.   I go outside to smoke.   My husband has a heart condition. 5. RECURRENT SYMPTOM: "Have you had difficulty breathing before?" If so, ask: "When was the last time?" and "What happened that time?"      Yes.   I've had bronchitis and pneumonia.   I think I have COPD. 6. CARDIAC HISTORY: "Do you have any history of heart disease?" (e.g., heart attack, angina, bypass surgery, angioplasty)      No cardiac history.      7. LUNG HISTORY: "Do you have any history of lung disease?"  (e.g., pulmonary embolus, asthma, emphysema)     Smoker, COPD, shortness of breath.  Do not use O2. 8. CAUSE: "What do you think is causing the breathing problem?"      I think it's from the hot weather and humidity.   9. OTHER SYMPTOMS: "  Do you have any other symptoms? (e.g., dizziness, runny nose, cough, chest pain, fever)     None of the above. 10. PREGNANCY: "Is there any chance you are pregnant?" "When was your last menstrual period?"       Not asked due to age. 11. TRAVEL: "Have you traveled out of the country in the last month?" (e.g., travel history, exposures)       I went to [facility] to see my sister about a month ago.  Protocols used: BREATHING DIFFICULTY-A-AH

## 2018-07-26 NOTE — Progress Notes (Signed)
Dr. Frederico Hamman T. Dixie Coppa, MD, [facility] [place], [address] Phone: [phone] Fax: [phone]  07/27/2018  Patient: Amanda [name], MRN: [id number], DOB: December 13, 1950, 67 y.o.  Primary Physician:  Owens Loffler, MD   Chief Complaint  Patient presents with  . Dizziness    x 1 week  . Esophageal Spasms  . Shortness of Breath  . Stress   Subjective:   Amanda [name] is a 67 y.o. very pleasant female patient who presents with the following:  Pleasant patient with COPD who has been a long-standing smoker who presents for evaluation with ongoing shortness of breath as well as other medical complaints as well as an episode of substernal chest pain that was relieved only by her husband's nitroglycerine. Wheezing a lot.   BP: 89 - 153 / 56 - 96  Still smoking, was smoking a carton a week.  Has some presumed COPD.  Had some substernal chest pain. Took some Tums and that did not help. Kept getting worse and worse, NTG helped the chest pain that she got from her husband. Treadmill stress test in 2013.  Has not seen Cardiology since 2017  Cardiac murmur has gotten louder  Past Medical History, Surgical History, Social History, Family History, Problem List, Medications, and Allergies have been reviewed and updated if relevant.  Patient Active Problem List   Diagnosis Date Noted  . Arthritis of knee 12/30/2017  . 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 sting   . COPD (chronic obstructive pulmonary disease) ([place]) 10/30/2016  . Gastroesophageal reflux disease 09/17/2016  . Degenerative arthritis of left knee 12/24/2015  . Memory loss 10/29/2015  . Primary osteoarthritis of right knee 06/16/2015  . Smoker 05/10/2015  . Solitary pulmonary nodule 03/28/2013  . Hypertension   . UNSPECIFIED VITAMIN D DEFICIENCY 05/29/2009  . Pure hypercholesterolemia 12/07/2007  . Major depressive disorder, recurrent episode, in partial remission ([place])  12/07/2007  . Diabetes mellitus type 2, controlled ([place]) 08/10/2007    Past Medical History:  Diagnosis Date  . 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 sting    bee stings  . Anxiety   . Aortic stenosis    a. 2013: nl LV sys fxn, mild MR, no evidence of pulm htn; b. TTE 8/17: EF 60-65%, no RWMA, nl LV dia fxn,  mild AS, mod AI  . COPD (chronic obstructive pulmonary disease) ([place]) 10/30/2016  . Degenerative arthritis of knee, bilateral 06/16/2015  . Depression   . Diabetes ([place])    diet managed-type 11  . GERD (gastroesophageal reflux disease)   . Hiatal hernia with gastroesophageal reflux 1997  . Hyperlipidemia   . Hypertension   . 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 Osteoporosis   . Palpitations    a. 03/2012: 48-hour Holter monitor showed intermittent sinus tachycardia with occasional PACs, without other significant arrhythmia  . Thyroid disease    hyperthyroidism as a teenager - received some injections age 7 that corrected this   . Tobacco abuse     Past Surgical History:  Procedure Laterality Date  . ABDOMINAL HYSTERECTOMY  1993  . BACK SURGERY    . BREAST BIOPSY Right 2007  . CHOLECYSTECTOMY    . COLONOSCOPY     30 years ago was normal per pt.   . ESOPHAGEAL DILATION     x 3  . fractured leg Right   . OVARIAN CYST REMOVAL  1972  . SALIVARY GLAND SURGERY    .  TONSILLECTOMY     4 yoa  . TOTAL KNEE ARTHROPLASTY Left 12/24/2015   Procedure: LEFT TOTAL KNEE ARTHROPLASTY;  Surgeon: [name], MD;  Location: [facility];  Service: Orthopedics;  Laterality: Left;  . TOTAL KNEE ARTHROPLASTY Right 12/30/2017   Procedure: RIGHT TOTAL KNEE ARTHROPLASTY;  Surgeon: [name], MD;  Location: [facility];  Service: Orthopedics;  Laterality: Right;  . WRIST FRACTURE SURGERY  11/2008    Social History   Socioeconomic History  . Marital status: Married    Spouse name: Not on file  . Number of children: 2  . Years of education: Not on file  . Highest education  level: Not on file  Occupational History  . Occupation: Chief Executive Officer: [employer]  Social Needs  . Financial resource strain: Not on file  . Food insecurity:    Worry: Not on file    Inability: Not on file  . Transportation needs:    Medical: Not on file    Non-medical: Not on file  Tobacco Use  . Smoking status: Current Every Day Smoker    Packs/day: 0.75    Years: 45.00    Pack years: 33.75    Types: Cigarettes  . Smokeless tobacco: Never Used  . Tobacco comment: trying to quit before knee surgery  Substance and Sexual Activity  . Alcohol use: Yes    Alcohol/week: 0.0 standard drinks    Comment: glass of wine twice a month   . Drug use: No  . Sexual activity: Not on file  Lifestyle  . Physical activity:    Days per week: Not on file    Minutes per session: Not on file  . Stress: Not on file  Relationships  . Social connections:    Talks on phone: Not on file    Gets together: Not on file    Attends religious service: Not on file    Active member of club or organization: Not on file    Attends meetings of clubs or organizations: Not on file    Relationship status: Not on file  . Intimate partner violence:    Fear of current or ex partner: Not on file    Emotionally abused: Not on file    Physically abused: Not on file    Forced sexual activity: Not on file  Other Topics Concern  . Not on file  Social History Narrative  . Not on file    Family History  Problem Relation Age of Onset  . Hyperlipidemia Mother   . Arthritis Mother   . Diabetes Mother   . Heart disease Mother   . Colon polyps Mother   . Cancer Paternal Grandmother        breast  . Hypercholesterolemia Sister   . Colon polyps Sister   . Breast cancer Sister   . Colon cancer Neg Hx   . Esophageal cancer Neg Hx   . Rectal cancer Neg Hx   . Stomach cancer Neg Hx   . Pancreatic cancer Neg Hx     Allergies  Allergen Reactions  . Diazepam Other  (See Comments)    REACTION: pt states she gets very angry and abusive verbally on this medication     Medication list reviewed and updated in full in [facility].   GEN: No acute illnesses, no fevers, chills. GI: No n/v/d, eating normally Pulm: No SOB Interactive and getting along well at home.  Otherwise, ROS is  as per the HPI.  Objective:   BP 138/74   Pulse 84   Temp 98.4 F (36.9 C) (Oral)   Ht 5' 5.75" (1.67 m)   Wt 219 lb (99.3 kg)   SpO2 97%   BMI 35.62 kg/m   GEN: WDWN, NAD, Non-toxic, A & O x 3 HEENT: Atraumatic, Normocephalic. Neck supple. No masses, No LAD. Ears and Nose: No external deformity. CV: RRR, No M/G/R. No JVD. No thrill. No extra heart sounds. PULM: CTA B, no wheezes, crackles, rhonchi. No retractions. No resp. distress. No accessory muscle use. EXTR: No c/c/e NEURO Normal gait.  PSYCH: Normally interactive. Conversant. Not depressed or anxious appearing.  Calm demeanor.   Laboratory and Imaging Data:  Assessment and Plan:   Chest pain due to myocardial ischemia, unspecified ischemic chest pain type - Plan: Ambulatory referral to Cardiology  Chronic obstructive pulmonary disease, unspecified COPD type ([place])  Smoker  SOB (shortness of breath) - Plan: EKG 12-Lead, Spirometry with graph, Ambulatory referral to Cardiology  Heart murmur - Plan: Ambulatory referral to Cardiology  EKG: Normal sinus rhythm. Normal axis, normal R wave progression, No acute ST elevation or depression. IRBBB.  Spirometry, FEV 78% FEV1/FVC = 108%  I am concerned with the patient's episode of chest pain that was not relieved by acid medication.  It was relieved when she took her husband's nitroglycerin only, which suggest this this may be an anginal equivalent and cardiac driven.  She has been more tired and she has been having some dyspnea on exertion, which could also be cardiac.  This is more concerning given that she also has normal spirometry today.  I am to  have her follow-up with cardiology for greater evaluation.  Also did refill her albuterol.  Follow-up: No follow-ups on file.  Meds ordered this encounter  Medications  . albuterol (PROVENTIL HFA;VENTOLIN HFA) 108 (90 Base) MCG/ACT inhaler    Sig: Inhale 2 puffs into the lungs every 4 (four) hours as needed for wheezing or shortness of breath.    Dispense:  1 Inhaler    Refill:  5   Orders Placed This Encounter  Procedures  . Ambulatory referral to Cardiology  . EKG 12-Lead  . Spirometry with graph    Signed,  Frederico Hamman T. Algernon Mundie, MD   Allergies as of 07/27/2018      Reactions   Diazepam Other (See Comments)   REACTION: pt states she gets very angry and abusive verbally on this medication      Medication List        Accurate as of 07/27/18  1:30 PM. Always use your most recent med list.          albuterol 108 (90 Base) MCG/ACT inhaler Commonly known as:  PROVENTIL HFA;VENTOLIN HFA Inhale 2 puffs into the lungs every 4 (four) hours as needed for wheezing or shortness of breath.   amitriptyline 50 MG tablet Commonly known as:  ELAVIL TAKE TWO TABLETS BY MOUTH AT NIGHT   atorvastatin 40 MG tablet Commonly known as:  LIPITOR TAKE ONE TABLET BY MOUTH ONCE DAILY   buPROPion 150 MG 24 hr tablet Commonly known as:  WELLBUTRIN XL TAKE 1 TABLET BY MOUTH DAILY   cetirizine 10 MG chewable tablet Commonly known as:  ZYRTEC Chew 10 mg by mouth daily.   clonazePAM 0.5 MG tablet Commonly known as:  KLONOPIN TAKE 1 TABLET BY MOUTH TWICE DAILY AS NEEDED   cyclobenzaprine 10 MG tablet Commonly known as:  FLEXERIL TAKE ONE  TABLET BY MOUTH THREE TIMES DAILY AS NEEDED FOR MUSCLE SPASM   EQ FIBER THERAPY 625 MG tablet Generic drug:  polycarbophil Take 625 mg by mouth daily. FIBER THERAPY   FLUoxetine 40 MG capsule Commonly known as:  PROZAC TAKE 1 CAPSULE BY MOUTH TWICE DAILY   lansoprazole 30 MG capsule Commonly known as:  PREVACID Take 1 capsule (30 mg total) by  mouth daily at 12 noon.   magnesium oxide 400 MG tablet Commonly known as:  MAG-OX Take 400 mg by mouth daily.   metoprolol tartrate 25 MG tablet Commonly known as:  LOPRESSOR Take 0.5 tablets (12.5 mg total) by mouth 2 (two) times daily.   multivitamin tablet Take 1 tablet by mouth daily.   vitamin C 1000 MG tablet Take 1,000 mg by mouth daily.   Vitamin D 1000 units capsule Take 1,000 Units by mouth 2 (two) times daily.

## 2018-07-27 ENCOUNTER — Encounter: Payer: Self-pay | Admitting: Family Medicine

## 2018-07-27 ENCOUNTER — Ambulatory Visit (INDEPENDENT_AMBULATORY_CARE_PROVIDER_SITE_OTHER): Payer: Medicare Other | Admitting: Family Medicine

## 2018-07-27 VITALS — BP 138/74 | HR 84 | Temp 98.4°F | Ht 65.75 in | Wt 219.0 lb

## 2018-07-27 DIAGNOSIS — I259 Chronic ischemic heart disease, unspecified: Secondary | ICD-10-CM | POA: Diagnosis not present

## 2018-07-27 DIAGNOSIS — J449 Chronic obstructive pulmonary disease, unspecified: Secondary | ICD-10-CM | POA: Diagnosis not present

## 2018-07-27 DIAGNOSIS — R011 Cardiac murmur, unspecified: Secondary | ICD-10-CM

## 2018-07-27 DIAGNOSIS — F17200 Nicotine dependence, unspecified, uncomplicated: Secondary | ICD-10-CM

## 2018-07-27 DIAGNOSIS — R0602 Shortness of breath: Secondary | ICD-10-CM

## 2018-07-27 MED ORDER — ALBUTEROL SULFATE HFA 108 (90 BASE) MCG/ACT IN AERS: 2.0000 | INHALATION_SPRAY | RESPIRATORY_TRACT | 5 refills | Status: DC | PRN

## 2018-07-27 NOTE — Patient Instructions (Signed)
REFERRALS TO SPECIALISTS, SPECIAL TESTS (MRI, CT, ULTRASOUNDS)  [facility] or  Anastasiya will help you. ASK CHECK-IN FOR HELP.  Specialist appointment times vary a great deal, based on their schedule / openings. -- Some specialists have very long wait times. (Example. Dermatology)    

## 2018-07-29 ENCOUNTER — Other Ambulatory Visit: Payer: Self-pay | Admitting: Family Medicine

## 2018-08-01 ENCOUNTER — Other Ambulatory Visit: Payer: Self-pay | Admitting: Family Medicine

## 2018-08-01 ENCOUNTER — Telehealth: Payer: Self-pay | Admitting: Neurology

## 2018-08-01 NOTE — Telephone Encounter (Signed)
Patient returning call to get results. See phone note from 7.16.19.

## 2018-08-01 NOTE — Telephone Encounter (Signed)
Last office visit 07/27/2018 for CP.  No future appointments.  Last refilled 08/18/2016 for #50 with 4 refills.  Ok to refill?

## 2018-08-02 NOTE — Telephone Encounter (Signed)
Spoke with pt.  Relayed MRI results and recommendation that she f/u with Dr. Marlou Sa.

## 2018-08-09 ENCOUNTER — Encounter: Payer: Self-pay | Admitting: Cardiovascular Disease

## 2018-08-09 ENCOUNTER — Encounter

## 2018-08-09 ENCOUNTER — Ambulatory Visit (INDEPENDENT_AMBULATORY_CARE_PROVIDER_SITE_OTHER): Payer: Medicare Other | Admitting: Cardiovascular Disease

## 2018-08-09 VITALS — BP 110/62 | HR 82 | Ht 68.0 in | Wt 215.8 lb

## 2018-08-09 DIAGNOSIS — E785 Hyperlipidemia, unspecified: Secondary | ICD-10-CM

## 2018-08-09 DIAGNOSIS — R072 Precordial pain: Secondary | ICD-10-CM

## 2018-08-09 DIAGNOSIS — Z720 Tobacco use: Secondary | ICD-10-CM | POA: Diagnosis not present

## 2018-08-09 DIAGNOSIS — I350 Nonrheumatic aortic (valve) stenosis: Secondary | ICD-10-CM | POA: Diagnosis not present

## 2018-08-09 DIAGNOSIS — I10 Essential (primary) hypertension: Secondary | ICD-10-CM

## 2018-08-09 NOTE — Patient Instructions (Addendum)
Medication Instructions: Your physician recommends that you continue on your current medications as directed. Please refer to the Current Medication list given to you today.  If you need a refill on your cardiac medications before your next appointment, please call your pharmacy.   Procedures/Testing: Your physician has requested that you have an echocardiogram. Echocardiography is a painless test that uses sound waves to create images of your heart. It provides your doctor with information about the size and shape of your heart and how well your heart's chambers and valves are working. You may receive an ultrasound enhancing agent through an IV if needed to better visualize your heart during the echo.This procedure takes approximately one hour. There are no restrictions for this procedure. This will take place at the [facility] clinic.   Your physician has requested that you have cardiac CT. Cardiac computed tomography (CT) is a painless test that uses an x-ray machine to take clear, detailed pictures of your heart. For further information please visit [email or web address]. Please follow instruction sheet as given.   Follow-Up: Your physician wants you to follow-up in 2 months with Dr. Fletcher Anon.  Special Instructions: Please arrive at the [facility] main entrance of [facility] at xx:xx AM (30-45 minutes prior to test start time)  [facility] [place], [place] [address] [phone]  Proceed to the [facility] Radiology Department (First Floor).  Please follow these instructions carefully (unless otherwise directed):  On the Night Before the Test: . Drink plenty of water. . Do not consume any caffeinated/decaffeinated beverages or chocolate 12 hours prior to your test.  On the Day of the Test: . Drink plenty of water. Do not drink any water within one hour of the test. . Do not eat any food 4 hours prior to the test. . You may take your  regular medications prior to the test. . Please take 2 of your 25 mg Metoprolol tablets (to equal 50 mg)one hour before the test.    After the Test: . Drink plenty of water. . After receiving IV contrast, you may experience a mild flushed feeling. This is normal. . On occasion, you may experience a mild rash up to 24 hours after the test. This is not dangerous. If this occurs, you can take Benadryl 25 mg and increase your fluid intake. . If you experience trouble breathing, this can be serious. If it is severe call 911 IMMEDIATELY. If it is mild, please call our office. . If you take any of these medications: Glipizide/Metformin, Avandament, Glucavance, please do not take 48 hours after completing test.    Thank you for choosing [facility] at [facility]!

## 2018-08-09 NOTE — Progress Notes (Signed)
Cardiology Office Note   Date:  08/09/2018   ID:  Amanda [name], DOB 19-Apr-1951, MRN [id number]  PCP:  Owens Loffler, MD  Cardiologist:   Kathlyn Sacramento, MD   Chief Complaint  Patient presents with  . other    12 month follow up. Meds reviewed by the pt. verbally. Pt. stated she had some chest tightness about two weeks ago and took her spouses NTG tablet and the pain was relieved. The patient has not noticed anymore symptoms of chest tightness.       History of Present Illness: Amanda [name] is a 67 y.o. female who presents for a follow-up visit regarding palpitations and mild aortic stenosis. . She has other chronic medical conditions that include COPD, tobacco use, diet-controlled diabetes, hypertension and hyperlipidemia. Previous cardiac workup in 2013 included a treadmill stress test  which was normal, echocardiogram showed normal LV systolic function, mild mitral regurgitation and no evidence of pulmonary hypertension. A 48 hour Holter monitor showed intermittent sinus tachycardia with occasional PACs but no other significant arrhythmias. Her palpitations significantly after adding Metoprolol.  Echocardiogram in August 2017 showed normal LV systolic function, mild aortic stenosis and moderate regurgitation.  She was seen by Dr. Lorelei Pont recently for chest pain.  Spirometry was normal.  EKG showed no ischemic changes.  She reports being under significant stress at home as her mother was diagnosed with stage IV pancreatic cancer and her daughter had a recent shoulder injury.  She reports having to intense " attacks" of chest pain over the last few months.  Its described as burning sensation substernally that responded to nitroglycerin.  She has noted worsening exertional dyspnea but the 2 episodes of chest pain that she had were at rest. She continues to smoke but she is trying to gradually cut down.  She reports no worsening palpitations.   Past Medical History:  Diagnosis  Date  . 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 sting    bee stings  . Anxiety   . Aortic stenosis    a. 2013: nl LV sys fxn, mild MR, no evidence of pulm htn; b. TTE 8/17: EF 60-65%, no RWMA, nl LV dia fxn,  mild AS, mod AI  . COPD (chronic obstructive pulmonary disease) ([place]) 10/30/2016  . Degenerative arthritis of knee, bilateral 06/16/2015  . Depression   . Diabetes ([place])    diet managed-type 11  . GERD (gastroesophageal reflux disease)   . Hiatal hernia with gastroesophageal reflux 1997  . Hyperlipidemia   . Hypertension   . Neuromuscular disorder ([place])    nerve damage in back and shoulder  . Osteoporosis   . Palpitations    a. 03/2012: 48-hour Holter monitor showed intermittent sinus tachycardia with occasional PACs, without other significant arrhythmia  . Thyroid disease    hyperthyroidism as a teenager - received some injections age 60 that corrected this   . Tobacco abuse     Past Surgical History:  Procedure Laterality Date  . ABDOMINAL HYSTERECTOMY  1993  . BACK SURGERY    . BREAST BIOPSY Right 2007  . CHOLECYSTECTOMY    . COLONOSCOPY     30 years ago was normal per pt.   . ESOPHAGEAL DILATION     x 3  . fractured leg Right   . OVARIAN CYST REMOVAL  1972  . SALIVARY GLAND SURGERY    . TONSILLECTOMY     4 yoa  . TOTAL KNEE ARTHROPLASTY Left 12/24/2015   Procedure: LEFT TOTAL KNEE ARTHROPLASTY;  Surgeon: [name], MD;  Location: [facility];  Service: Orthopedics;  Laterality: Left;  . TOTAL KNEE ARTHROPLASTY Right 12/30/2017   Procedure: RIGHT TOTAL KNEE ARTHROPLASTY;  Surgeon: [name], MD;  Location: [facility];  Service: Orthopedics;  Laterality: Right;  . WRIST FRACTURE SURGERY  11/2008     Current Outpatient Medications  Medication Sig Dispense Refill  . albuterol (PROVENTIL HFA;VENTOLIN HFA) 108 (90 Base) MCG/ACT inhaler Inhale 2 puffs into the lungs every 4 (four) hours as needed for wheezing or shortness of breath. 1 Inhaler 5  . amitriptyline (ELAVIL) 50 MG tablet  TAKE TWO TABLETS BY MOUTH AT NIGHT 180 tablet 3  . Ascorbic Acid (VITAMIN C) 1000 MG tablet Take 1,000 mg by mouth daily.    Marland Kitchen atorvastatin (LIPITOR) 40 MG tablet TAKE ONE TABLET BY MOUTH ONCE DAILY 90 tablet 1  . buPROPion (WELLBUTRIN XL) 150 MG 24 hr tablet TAKE 1 TABLET BY MOUTH DAILY 90 tablet 0  . cetirizine (ZYRTEC) 10 MG chewable tablet Chew 10 mg by mouth daily.    . Cholecalciferol (VITAMIN D) 1000 UNITS capsule Take 1,000 Units by mouth 2 (two) times daily.     . clonazePAM (KLONOPIN) 0.5 MG tablet TAKE 1 TABLET BY MOUTH TWICE DAILY AS NEEDED 30 tablet 3  . cyclobenzaprine (FLEXERIL) 10 MG tablet TAKE ONE TABLET BY MOUTH THREE TIMES DAILY AS NEEDED FOR MUSCLE SPASM 50 tablet 4  . FLUoxetine (PROZAC) 40 MG capsule TAKE 1 CAPSULE BY MOUTH TWICE DAILY 180 capsule 0  . lansoprazole (PREVACID) 30 MG capsule Take 1 capsule (30 mg total) by mouth daily at 12 noon. 90 capsule 3  . magnesium oxide (MAG-OX) 400 MG tablet Take 400 mg by mouth daily.    . metoprolol tartrate (LOPRESSOR) 25 MG tablet Take 0.5 tablets (12.5 mg total) by mouth 2 (two) times daily. 90 tablet 0  . Multiple Vitamin (MULTIVITAMIN) tablet Take 1 tablet by mouth daily.    . pantoprazole (PROTONIX) 40 MG tablet Take 40 mg by mouth daily.    . polycarbophil (EQ FIBER THERAPY) 625 MG tablet Take 625 mg by mouth daily. FIBER THERAPY     No current facility-administered medications for this visit.     Allergies:   Diazepam    Social History:  The patient  reports that she has been smoking cigarettes. She has a 33.75 pack-year smoking history. She has never used smokeless tobacco. She reports that she drinks alcohol. She reports that she does not use drugs.   Family History:  The patient's family history includes Arthritis in her mother; Breast cancer in her sister; Cancer in her paternal grandmother; Colon polyps in her mother and sister; Diabetes in her mother; Heart disease in her mother; Hypercholesterolemia in her  sister; Hyperlipidemia in her mother.    ROS:  Please see the history of present illness.   Otherwise, review of systems are positive for none.   All other systems are reviewed and negative.    PHYSICAL EXAM: VS:  BP 110/62 (BP Location: Left Arm, Patient Position: Sitting, Cuff Size: Normal)   Pulse 82   Ht 5\' 8"  (1.727 m)   Wt 215 lb 12 oz (97.9 kg)   BMI 32.80 kg/m  , BMI Body mass index is 32.8 kg/m. GEN: Well nourished, well developed, in no acute distress  HEENT: normal  Neck: no JVD, carotid bruits, or masses Cardiac: RRR; no  rubs, or gallops,no edema . 2/6 crescendo decrescendo systolic murmur in the  aortic area which is  mid peaking. Respiratory:  clear to auscultation bilaterally, normal work of breathing GI: soft, nontender, nondistended, + BS MS: no deformity or atrophy  Skin: warm and dry, no rash Neuro:  Strength and sensation are intact Psych: euthymic mood, full affect   EKG:  EKG is not ordered today. Recent EKG was reviewed which showed sinus rhythm with incomplete right bundle branch block.   Recent Labs: 12/20/2017: BUN 7; Creatinine, Ser 0.89; Hemoglobin 12.9; Platelets 226; Potassium 4.1; Sodium 137    Lipid Panel    Component Value Date/Time   CHOL 178 06/02/2017 1327   CHOL 201 (H) 09/18/2013 1016   TRIG 81.0 06/02/2017 1327   HDL 53.20 06/02/2017 1327   HDL 53 09/18/2013 1016   CHOLHDL 3 06/02/2017 1327   VLDL 16.2 06/02/2017 1327   LDLCALC 108 (H) 06/02/2017 1327   LDLCALC 130 (H) 09/18/2013 1016   LDLDIRECT 209.2 05/28/2009 0909      Wt Readings from Last 3 Encounters:  08/09/18 215 lb 12 oz (97.9 kg)  07/27/18 219 lb (99.3 kg)  04/15/18 209 lb (94.8 kg)         ASSESSMENT AND PLAN:  1.  Chest pain and worsening exertional dyspnea: The patient had 2 worrisome episodes of chest pain that responded to nitroglycerin.  She also reports worsening exertional dyspnea.  She has multiple risk factors for coronary artery disease and I  have recommended proceeding with CT angiography of coronary arteries with FFR.  2.  Aortic stenosis: This was mild on most recent echocardiogram 2 years ago.  Given recent symptoms, I ordered a follow-up echocardiogram which will be done before CTA.    3.  Palpitations: Previous mild PACs. Symptoms are well-controlled on small dose metoprolol.  4 . Tobacco use: I again discussed with her the importance of smoking cessation.   5.  Hyperlipidemia: Currently on atorvastatin with most recent LDL of 108.   Disposition:   FU with me in 2 months  Signed,  Kathlyn Sacramento, MD  08/09/2018 1:39 PM    [place]

## 2018-08-15 ENCOUNTER — Encounter (INDEPENDENT_AMBULATORY_CARE_PROVIDER_SITE_OTHER): Payer: Self-pay | Admitting: Orthopedic Surgery

## 2018-08-15 ENCOUNTER — Ambulatory Visit (INDEPENDENT_AMBULATORY_CARE_PROVIDER_SITE_OTHER): Payer: Medicare Other | Admitting: Orthopedic Surgery

## 2018-08-15 DIAGNOSIS — M4802 Spinal stenosis, cervical region: Secondary | ICD-10-CM

## 2018-08-15 NOTE — Progress Notes (Signed)
Office Visit Note   Patient: Amanda [name]           Date of Birth: [birth year]           MRN: [id number] Visit Date: 08/15/2018 Requested by: Owens Loffler, MD [place], [place] [address] PCP: Owens Loffler, MD  Subjective: Chief Complaint  Patient presents with  . Neck - Pain    HPI: Kenecia is a patient with neck pain.  She has had an MRI scan from Dr. Delice Lesch who is her neurologist.  This scan is reviewed with the patient and it shows significant stenosis in the cervical spine.  She describes balance problems headaches as well as left leg weakness.  She also reports occasional numbness and tingling in that left arm.  Patient is a smoker.              ROS: All systems reviewed are negative as they relate to the chief complaint within the history of present illness.  Patient denies  fevers or chills.   Assessment & Plan: Visit Diagnoses:  1. Spinal stenosis of cervical region     Plan: Impression is significant spinal stenosis in the cervical spine likely accounting for the patient's headache and balance problems and perceived weakness in that left leg.  This is a surgical problem based on the amount of stenosis present.  I do not think injections are indicated at this time.  Follow-up with neurosurgeon for surgical evaluation.  Follow-Up Instructions: Return if symptoms worsen or fail to improve.   Orders:  Orders Placed This Encounter  Procedures  . Ambulatory referral to Neurosurgery   No orders of the defined types were placed in this encounter.     Procedures: No procedures performed   Clinical Data: No additional findings.  Objective: Vital Signs: There were no vitals taken for this visit.  Physical Exam:   Constitutional: Patient appears well-developed HEENT:  Head: Normocephalic Eyes:EOM are normal Neck: Normal range of motion Cardiovascular: Normal rate Pulmonary/chest: Effort normal Neurologic: Patient is alert Skin:  Skin is warm Psychiatric: Patient has normal mood and affect    Ortho Exam: Orthopedic exam demonstrates good ankle dorsi flexion plantar flexion quad hamstring strength.  I really cannot get a difference on manual muscle motor testing in these areas.  She does have paresthesias in the C5 distribution on the left compared to the right.  Grip strength EPL FPL biceps deltoid strength all symmetric along with triceps strength.  Reflexes 1+ out of 4 bilateral biceps triceps patella and Achilles.  No clonus in the bilateral lower extremities.  All extremities are perfused.  Specialty Comments:  No specialty comments available.  Imaging: No results found.   PMFS History: Patient Active Problem List   Diagnosis Date Noted  . Arthritis of knee 12/30/2017  . 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 sting   . COPD (chronic obstructive pulmonary disease) ([place] [facility]) 10/30/2016  . Gastroesophageal reflux disease 09/17/2016  . Degenerative arthritis of left knee 12/24/2015  . Memory loss 10/29/2015  . Primary osteoarthritis of right knee 06/16/2015  . Smoker 05/10/2015  . Solitary pulmonary nodule 03/28/2013  . Hypertension   . UNSPECIFIED VITAMIN D DEFICIENCY 05/29/2009  . Pure hypercholesterolemia 12/07/2007  . Major depressive disorder, recurrent episode, in partial remission ([place]) 12/07/2007  . Diabetes mellitus type 2, controlled ([place]) 08/10/2007   Past Medical History:  Diagnosis Date  . 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 sting    bee stings  . Anxiety   . Aortic stenosis  a. 2013: nl LV sys fxn, mild MR, no evidence of pulm htn; b. TTE 8/17: EF 60-65%, no RWMA, nl LV dia fxn,  mild AS, mod AI  . COPD (chronic obstructive pulmonary disease) ([place]) 10/30/2016  . Degenerative arthritis of knee, bilateral 06/16/2015  . Depression   . Diabetes ([place])    diet managed-type 11  . GERD (gastroesophageal reflux disease)   . Hiatal hernia with gastroesophageal reflux 1997  . Hyperlipidemia   . Hypertension   . Neuromuscular disorder  ([place])    nerve damage in back and shoulder  . Osteoporosis   . Palpitations    a. 03/2012: 48-hour Holter monitor showed intermittent sinus tachycardia with occasional PACs, without other significant arrhythmia  . Thyroid disease    hyperthyroidism as a teenager - received some injections age 13 that corrected this   . Tobacco abuse     Family History  Problem Relation Age of Onset  . Hyperlipidemia Mother   . Arthritis Mother   . Diabetes Mother   . Heart disease Mother   . Colon polyps Mother   . Cancer Paternal Grandmother        breast  . Hypercholesterolemia Sister   . Colon polyps Sister   . Breast cancer Sister   . Colon cancer Neg Hx   . Esophageal cancer Neg Hx   . Rectal cancer Neg Hx   . Stomach cancer Neg Hx   . Pancreatic cancer Neg Hx     Past Surgical History:  Procedure Laterality Date  . ABDOMINAL HYSTERECTOMY  1993  . BACK SURGERY    . BREAST BIOPSY Right 2007  . CHOLECYSTECTOMY    . COLONOSCOPY     30 years ago was normal per pt.   . ESOPHAGEAL DILATION     x 3  . fractured leg Right   . OVARIAN CYST REMOVAL  1972  . SALIVARY GLAND SURGERY    . TONSILLECTOMY     4 yoa  . TOTAL KNEE ARTHROPLASTY Left 12/24/2015   Procedure: LEFT TOTAL KNEE ARTHROPLASTY;  Surgeon: [name], MD;  Location: [facility];  Service: Orthopedics;  Laterality: Left;  . TOTAL KNEE ARTHROPLASTY Right 12/30/2017   Procedure: RIGHT TOTAL KNEE ARTHROPLASTY;  Surgeon: [name], MD;  Location: [facility];  Service: Orthopedics;  Laterality: Right;  . WRIST FRACTURE SURGERY  11/2008   Social History   Occupational History  . Occupation: Chief Executive Officer: [employer]  Tobacco Use  . Smoking status: Current Every Day Smoker    Packs/day: 0.75    Years: 45.00    Pack years: 33.75    Types: Cigarettes  . Smokeless tobacco: Never Used  . Tobacco comment: trying to quit before knee surgery  Substance and Sexual Activity  .  Alcohol use: Yes    Alcohol/week: 0.0 standard drinks    Comment: glass of wine twice a month   . Drug use: No  . Sexual activity: Not on file

## 2018-08-17 ENCOUNTER — Ambulatory Visit (INDEPENDENT_AMBULATORY_CARE_PROVIDER_SITE_OTHER): Payer: Medicare Other

## 2018-08-17 ENCOUNTER — Other Ambulatory Visit: Payer: Self-pay

## 2018-08-17 DIAGNOSIS — I350 Nonrheumatic aortic (valve) stenosis: Secondary | ICD-10-CM

## 2018-08-23 ENCOUNTER — Telehealth: Payer: Self-pay | Admitting: *Deleted

## 2018-08-23 DIAGNOSIS — I350 Nonrheumatic aortic (valve) stenosis: Secondary | ICD-10-CM

## 2018-08-23 NOTE — Telephone Encounter (Signed)
Patient made aware of results and verbalized understanding.  Echo orders placed for 12 months.

## 2018-08-23 NOTE — Telephone Encounter (Signed)
Patient called and reminded to get her BMET drawn at [facility] this week for her upcoming cardiac ct.

## 2018-08-23 NOTE — Telephone Encounter (Signed)
-----   Message from Wellington Hampshire, MD sent at 08/22/2018  3:01 PM EDT ----- Inform patient that echo showed normal ejection fraction.  Aortic stenosis has worsened from before but still in the moderate range.  Recommend repeat echocardiogram in 1 year.

## 2018-08-24 ENCOUNTER — Other Ambulatory Visit
Admission: RE | Admit: 2018-08-24 | Discharge: 2018-08-24 | Disposition: A | Discharge status: Home / Self Care | Payer: Medicare Other | Source: Ambulatory Visit | Attending: Cardiovascular Disease | Admitting: Cardiovascular Disease

## 2018-08-24 DIAGNOSIS — I10 Essential (primary) hypertension: Secondary | ICD-10-CM | POA: Diagnosis not present

## 2018-08-24 DIAGNOSIS — R072 Precordial pain: Secondary | ICD-10-CM | POA: Diagnosis not present

## 2018-08-24 LAB — BASIC METABOLIC PANEL
Anion gap: 6 (ref 5–15)
BUN: 9 mg/dL (ref 8–23)
CO2: 27 mmol/L (ref 22–32)
Calcium: 9.3 mg/dL (ref 8.9–10.3)
Chloride: 107 mmol/L (ref 98–111)
Creatinine, Ser: 0.69 mg/dL (ref 0.44–1.00)
GFR calc Af Amer: >60 mL/min (ref >60)
GFR calc non Af Amer: >60 mL/min (ref >60)
Glucose, Bld: 106 mg/dL — ABNORMAL HIGH (ref 70–99)
Potassium: 4.2 mmol/L (ref 3.5–5.1)
Sodium: 140 mmol/L (ref 135–145)

## 2018-09-02 ENCOUNTER — Ambulatory Visit (HOSPITAL_COMMUNITY)
Admission: RE | Admit: 2018-09-02 | Discharge: 2018-09-02 | Disposition: A | Discharge status: Home / Self Care | Payer: Medicare Other | Source: Ambulatory Visit | Attending: Cardiovascular Disease | Admitting: Cardiovascular Disease

## 2018-09-02 ENCOUNTER — Ambulatory Visit (HOSPITAL_COMMUNITY): Admission: RE | Admit: 2018-09-02 | Payer: Medicare Other | Source: Ambulatory Visit

## 2018-09-02 ENCOUNTER — Encounter (HOSPITAL_COMMUNITY): Payer: Self-pay

## 2018-09-02 DIAGNOSIS — I10 Essential (primary) hypertension: Secondary | ICD-10-CM | POA: Diagnosis not present

## 2018-09-02 DIAGNOSIS — K449 Diaphragmatic hernia without obstruction or gangrene: Secondary | ICD-10-CM | POA: Insufficient documentation

## 2018-09-02 DIAGNOSIS — R072 Precordial pain: Secondary | ICD-10-CM | POA: Insufficient documentation

## 2018-09-02 DIAGNOSIS — I700 Atherosclerosis of aorta: Secondary | ICD-10-CM | POA: Diagnosis not present

## 2018-09-02 MED ORDER — NITROGLYCERIN 0.4 MG SL SUBL
0.8000 mg | SUBLINGUAL_TABLET | Freq: Once | SUBLINGUAL | Status: AC
  Administered 2018-09-02: 0.8 mg via SUBLINGUAL
  Filled 2018-09-02: qty 25

## 2018-09-02 MED ORDER — NITROGLYCERIN 0.4 MG SL SUBL
SUBLINGUAL_TABLET | SUBLINGUAL | Status: AC
  Administered 2018-09-02: 0.8 mg via SUBLINGUAL
  Filled 2018-09-02: qty 2

## 2018-09-02 MED ORDER — METOPROLOL TARTRATE 5 MG/5ML IV SOLN
5.0000 mg | INTRAVENOUS | Status: DC | PRN
  Administered 2018-09-02 (×2): 5 mg via INTRAVENOUS
  Filled 2018-09-02 (×3): qty 5

## 2018-09-02 MED ORDER — METOPROLOL TARTRATE 5 MG/5ML IV SOLN
INTRAVENOUS | Status: AC
  Administered 2018-09-02: 5 mg via INTRAVENOUS
  Filled 2018-09-02: qty 15

## 2018-09-02 MED ORDER — IOPAMIDOL (ISOVUE-370) INJECTION 76%
100.0000 mL | Freq: Once | INTRAVENOUS | Status: AC | PRN
  Administered 2018-09-02: 80 mL via INTRAVENOUS

## 2018-09-02 NOTE — Progress Notes (Signed)
Tolerated CT without incident. Drank a soda and peanut butter crackers. Ambulated steady gait to exit.

## 2018-09-08 DIAGNOSIS — G959 Disease of spinal cord, unspecified: Secondary | ICD-10-CM | POA: Diagnosis not present

## 2018-09-13 ENCOUNTER — Other Ambulatory Visit: Payer: Self-pay | Admitting: Cardiovascular Disease

## 2018-09-13 ENCOUNTER — Other Ambulatory Visit: Payer: Self-pay | Admitting: Neurosurgery

## 2018-09-21 ENCOUNTER — Telehealth: Payer: Self-pay | Admitting: Family Medicine

## 2018-09-21 NOTE — Telephone Encounter (Signed)
Copied from [place] [id number]. Topic: Medicare AWV >> Sep 07, 2018  2:02 PM Gerre Pebbles R wrote: Reason for CRM: Left message for patient to call back and schedule Annual Medicare Wellness Visit (AWV).  Last AWV 6.27.18; please schedule at anytime with Lindell Noe at [facility]. Please schedule CPE with PCP 1-2 weeks after  OK for PEC to schedule >> Sep 21, 2018 12:24 PM Selinda Flavin B, NT wrote: Patient calling to schedule AWV and CPE 1-2 weeks after. Dr Lillie Fragmin first available CPE does not work for her because she is having back surgery on 10/26/18. States that she will call the office back to schedule AWV and CPE once she has her surgery and knows more about her post op requirement.

## 2018-09-22 ENCOUNTER — Ambulatory Visit: Payer: Medicare Other | Admitting: Cardiovascular Disease

## 2018-10-04 ENCOUNTER — Telehealth: Payer: Self-pay | Admitting: Cardiovascular Disease

## 2018-10-04 NOTE — Telephone Encounter (Signed)
To Dr. Olga Coaster.  Is this read by you or one of the readers (Nelson/ Collins...)?

## 2018-10-04 NOTE — Telephone Encounter (Signed)
The study gets read in [place] by 1 of our imaging physicians.  It is not read yet.

## 2018-10-04 NOTE — Telephone Encounter (Signed)
Please call, states dr Fletcher Anon needs to read his portion of cardia CT performed on 09/02/2018

## 2018-10-05 NOTE — Telephone Encounter (Signed)
Amanda [name], just an FYI to be looking out for this.  Thanks!

## 2018-10-07 ENCOUNTER — Other Ambulatory Visit: Payer: Self-pay | Admitting: Family Medicine

## 2018-10-07 ENCOUNTER — Telehealth: Payer: Self-pay | Admitting: *Deleted

## 2018-10-07 DIAGNOSIS — E785 Hyperlipidemia, unspecified: Secondary | ICD-10-CM

## 2018-10-07 DIAGNOSIS — I10 Essential (primary) hypertension: Secondary | ICD-10-CM

## 2018-10-07 MED ORDER — ROSUVASTATIN CALCIUM 40 MG PO TABS: 40.0000 mg | ORAL_TABLET | Freq: Every day | ORAL | 1 refills | Status: DC

## 2018-10-07 NOTE — Telephone Encounter (Signed)
-----   Message from Wellington Hampshire, MD sent at 10/06/2018  1:16 PM EDT ----- I called the patient and informed her of moderate nonobstructive three-vessel coronary artery disease.  I apologized given that the test was done last month but it was not read until yesterday. I recommend the following: Change atorvastatin to rosuvastatin 40 mg daily.  Check lipid and liver profile in 2 months. Continue aspirin 81 mg daily but hold 7 days before cervical spine surgery. She is at low risk for cervical spine surgery which is scheduled for next month. No need to see Thurmond Butts on November 11. Schedule her to see me in 2 months from now.

## 2018-10-07 NOTE — Telephone Encounter (Signed)
Electronic refill request Clonazepam Last office visit 07/27/18 Last refill 06/13/18 #30/3 See allergy/contraindication

## 2018-10-07 NOTE — Telephone Encounter (Signed)
Patient made aware of results and verbalized understanding.  She understands to: Stop the atorvastatin Start Rosuvastatin 40 mg daily Stop the aspirin 7 days prior to her surgery Have fasting labs 2 months from now, orders have been placed. Appointment made for 12/13/17 with Dr. Fletcher Anon.

## 2018-10-09 ENCOUNTER — Other Ambulatory Visit: Payer: Self-pay | Admitting: Family Medicine

## 2018-10-13 DIAGNOSIS — G959 Disease of spinal cord, unspecified: Secondary | ICD-10-CM | POA: Diagnosis not present

## 2018-10-17 ENCOUNTER — Ambulatory Visit: Payer: Medicare Other | Admitting: Physician Assistant

## 2018-10-17 NOTE — Pre-Procedure Instructions (Addendum)
Amanda [name]  10/17/2018      [facility] [place], [place] - [address] [place] [address] Phone: [phone] Fax: [phone]    Your procedure is scheduled on Wed., Nov. 20, 2019 from 2:35PM-5:29PM  Report to [facility] Admitting Entrance "A" at 12:30PM  Call this number if you have problems the morning of surgery:  [phone]   Remember:  Do not eat or drink after midnight on Nov. 19th    Take these medicines the morning of surgery with A SIP OF WATER: BuPROPion (WELLBUTRIN XL), Cetirizine (ZYRTEC), FLUoxetine (PROZAC), Metoprolol tartrate (LOPRESSOR), DiphenhydrAMINE (BENADRYL), and Pantoprazole (PROTONIX)   If needed: Acetaminophen (TYLENOL), ClonazePAM (KLONOPIN), Cyclobenzaprine (FLEXERIL), GuaiFENesin (MUCINEX),Simethicone, and Albuterol Inhaler- bring with you the day of surgery  Follow your surgeon's instructions on when to stop Aspirin.  If no instructions were given by your surgeon then you will need to call the office to get those instructions.    7 days before surgery (10/19/18), stop taking all Other Aspirin Products, Vitamins, Fish oils, and Herbal medications. Also stop all NSAIDS i.e. Advil, Ibuprofen, Motrin, Aleve, Anaprox, Naproxen, BC, Goody Powders, and all Supplements.  How to Manage Your Diabetes Before and After Surgery  Why is it important to control my blood sugar before and after surgery? . Improving blood sugar levels before and after surgery helps healing and can limit problems. . A way of improving blood sugar control is eating a healthy diet by: o  Eating less sugar and carbohydrates o  Increasing activity/exercise o  Talking with your doctor about reaching your blood sugar goals . High blood sugars (greater than 180 mg/dL) can raise your risk of infections and slow your recovery, so you will need to focus on controlling your diabetes during the weeks before surgery. . Make sure that the  doctor who takes care of your diabetes knows about your planned surgery including the date and location.  How do I manage my blood sugar before surgery? . Check your blood sugar at least 4 times a day, starting 2 days before surgery, to make sure that the level is not too high or low. o Check your blood sugar the morning of your surgery when you wake up and every 2 hours until you get to the Short Stay unit. . If your blood sugar is less than 70 mg/dL, you will need to treat for low blood sugar: o Do not take insulin. o Treat a low blood sugar (less than 70 mg/dL) with  cup of clear juice (cranberry or apple), 4 glucose tablets, OR glucose gel. Recheck blood sugar in 15 minutes after treatment (to make sure it is greater than 70 mg/dL). If your blood sugar is not greater than 70 mg/dL on recheck, call [phone] o  for further instructions. .    If your CBG is greater than 220 mg/dL, call the number above further instructions.  . If you are admitted to the hospital after surgery: o Your blood sugar will be checked by the staff and you will probably be given insulin after surgery (instead of oral diabetes medicines) to make sure you have good blood sugar levels. o The goal for blood sugar control after surgery is 80-180 mg/dL.  Reviewed and Endorsed by [facility] Patient Education Committee, August 2015   Do not wear jewelry, make-up or nail polish.  Do not wear lotions, powders, or perfumes, or deodorant.  Do not shave 48 hours prior  to surgery.    Do not bring valuables to the hospital.  [facility] is not responsible for any belongings or valuables.  Contacts, dentures or bridgework may not be worn into surgery.  Leave your suitcase in the car.  After surgery it may be brought to your room.  For patients admitted to the hospital, discharge time will be determined by your treatment team.  Patients discharged the day of surgery will not be allowed to drive home.   Special  instructions:  [facility]- Preparing For Surgery  Before surgery, you can play an important role. Because skin is not sterile, your skin needs to be as free of germs as possible. You can reduce the number of germs on your skin by washing with CHG (chlorahexidine gluconate) Soap before surgery.  CHG is an antiseptic cleaner which kills germs and bonds with the skin to continue killing germs even after washing.    Oral Hygiene is also important to reduce your risk of infection.  Remember - BRUSH YOUR TEETH THE MORNING OF SURGERY WITH YOUR REGULAR TOOTHPASTE  Please do not use if you have an allergy to CHG or antibacterial soaps. If your skin becomes reddened/irritated stop using the CHG.  Do not shave (including legs and underarms) for at least 48 hours prior to first CHG shower. It is OK to shave your face.  Please follow these instructions carefully.   1. Shower the NIGHT BEFORE SURGERY and the MORNING OF SURGERY with CHG.   2. If you chose to wash your hair, wash your hair first as usual with your normal shampoo.  3. After you shampoo, rinse your hair and body thoroughly to remove the shampoo.  4. Use CHG as you would any other liquid soap. You can apply CHG directly to the skin and wash gently with a scrungie or a clean washcloth.   5. Apply the CHG Soap to your body ONLY FROM THE NECK DOWN.  Do not use on open wounds or open sores. Avoid contact with your eyes, ears, mouth and genitals (private parts). Wash Face and genitals (private parts)  with your normal soap.  6. Wash thoroughly, paying special attention to the area where your surgery will be performed.  7. Thoroughly rinse your body with warm water from the neck down.  8. DO NOT shower/wash with your normal soap after using and rinsing off the CHG Soap.  9. Pat yourself dry with a CLEAN TOWEL.  10. Wear CLEAN PAJAMAS to bed the night before surgery, wear comfortable clothes the morning of surgery  11. Place CLEAN SHEETS on  your bed the night of your first shower and DO NOT SLEEP WITH PETS.  Day of Surgery:  Do not apply any deodorants/lotions.  Please wear clean clothes to the hospital/surgery center.   Remember to brush your teeth WITH YOUR REGULAR TOOTHPASTE.  Please read over the following fact sheets that you were given. Pain Booklet, Coughing and Deep Breathing, MRSA Information and Surgical Site Infection Prevention

## 2018-10-18 ENCOUNTER — Encounter (HOSPITAL_COMMUNITY)
Admission: RE | Admit: 2018-10-18 | Discharge: 2018-10-18 | Disposition: A | Discharge status: Home / Self Care | Payer: Medicare Other | Source: Ambulatory Visit | Attending: Neurosurgery | Admitting: Neurosurgery

## 2018-10-18 ENCOUNTER — Other Ambulatory Visit: Payer: Self-pay

## 2018-10-18 ENCOUNTER — Encounter (HOSPITAL_COMMUNITY): Payer: Self-pay

## 2018-10-18 DIAGNOSIS — Z01812 Encounter for preprocedural laboratory examination: Secondary | ICD-10-CM | POA: Diagnosis not present

## 2018-10-18 LAB — CBC
HCT: 41.5 % (ref 36.0–46.0)
Hemoglobin: 13.1 g/dL (ref 12.0–15.0)
MCH: 33.8 pg (ref 26.0–34.0)
MCHC: 31.6 g/dL (ref 30.0–36.0)
MCV: 107 fL — ABNORMAL HIGH (ref 80.0–100.0)
Platelets: 203 10*3/uL (ref 150–400)
RBC: 3.88 MIL/uL (ref 3.87–5.11)
RDW: 11.2 % — ABNORMAL LOW (ref 11.5–15.5)
WBC: 6.4 10*3/uL (ref 4.0–10.5)
nRBC: 0 % (ref 0.0–0.2)

## 2018-10-18 LAB — HEMOGLOBIN A1C
Hgb A1c MFr Bld: 5.9 % — ABNORMAL HIGH (ref 4.8–5.6)
Mean Plasma Glucose: 122.63 mg/dL

## 2018-10-18 LAB — BASIC METABOLIC PANEL
Anion gap: 9 (ref 5–15)
BUN: 10 mg/dL (ref 8–23)
CO2: 20 mmol/L — ABNORMAL LOW (ref 22–32)
Calcium: 9.3 mg/dL (ref 8.9–10.3)
Chloride: 108 mmol/L (ref 98–111)
Creatinine, Ser: 0.76 mg/dL (ref 0.44–1.00)
GFR calc Af Amer: >60 mL/min (ref >60)
GFR calc non Af Amer: >60 mL/min (ref >60)
Glucose, Bld: 107 mg/dL — ABNORMAL HIGH (ref 70–99)
Potassium: 4.3 mmol/L (ref 3.5–5.1)
Sodium: 137 mmol/L (ref 135–145)

## 2018-10-18 LAB — SURGICAL PCR SCREEN
MRSA, PCR: NEGATIVE
Staphylococcus aureus: POSITIVE — AB

## 2018-10-18 LAB — GLUCOSE, CAPILLARY: Glucose-Capillary: 116 mg/dL — ABNORMAL HIGH (ref 70–99)

## 2018-10-18 NOTE — Progress Notes (Signed)
I called a prescription for Mupirocin ointment to [facility] , [place] , [place].

## 2018-10-18 NOTE — Progress Notes (Signed)
PCP - Dr.Spencer Edilia Bo  Cardiologist - Dr. Fletcher Anon  Chest x-ray - Denies  EKG - 07/27/18 (E)  Stress Test - Denies  ECHO - 08/17/18 (E)  Cardiac Cath -Denies   AICD- na PM- na LOOP- na  Sleep Study - Denies CPAP - None  LABS- 10/18/18: CBC, BMP, PCR  ASA- LD- 11/12  HA1C- 10/18/18 Fasting Blood Sugar - 115-170, today 116 Checks Blood Sugar __2___ times a day- Pt takes no meds, controls with diet.  Anesthesia- No  Pt denies having chest pain, sob, or fever at this time. All instructions explained to the pt, with a verbal understanding of the material. Pt agrees to go over the instructions while at home for a better understanding. The opportunity to ask questions was provided.

## 2018-10-24 ENCOUNTER — Other Ambulatory Visit: Payer: Self-pay | Admitting: Family Medicine

## 2018-10-24 NOTE — Telephone Encounter (Signed)
Electronic refill request for Fluoxetine Last office visit 07/27/18 Last refill 07/29/18 #180 See drug warning with Naproxen

## 2018-10-26 ENCOUNTER — Encounter (HOSPITAL_COMMUNITY): Payer: Self-pay | Admitting: Anesthesiology

## 2018-10-26 ENCOUNTER — Inpatient Hospital Stay (HOSPITAL_COMMUNITY)
Admission: RE | Disposition: A | Discharge status: Home / Self Care | Payer: Self-pay | Source: Ambulatory Visit | Attending: Neurosurgery

## 2018-10-26 ENCOUNTER — Inpatient Hospital Stay (HOSPITAL_COMMUNITY)
Admission: RE | Admit: 2018-10-26 | Discharge: 2018-10-27 | DRG: 473 | Disposition: A | Discharge status: Home / Self Care | Payer: Medicare Other | Source: Ambulatory Visit | Attending: Neurosurgery | Admitting: Neurosurgery

## 2018-10-26 ENCOUNTER — Other Ambulatory Visit: Payer: Self-pay

## 2018-10-26 ENCOUNTER — Inpatient Hospital Stay (HOSPITAL_COMMUNITY): Payer: Medicare Other | Admitting: Anesthesiology

## 2018-10-26 ENCOUNTER — Inpatient Hospital Stay (HOSPITAL_COMMUNITY): Payer: Medicare Other

## 2018-10-26 DIAGNOSIS — M47022 Vertebral artery compression syndromes, cervical region: Principal | ICD-10-CM | POA: Diagnosis present

## 2018-10-26 DIAGNOSIS — Z888 Allergy status to other drugs, medicaments and biological substances status: Secondary | ICD-10-CM

## 2018-10-26 DIAGNOSIS — I10 Essential (primary) hypertension: Secondary | ICD-10-CM | POA: Diagnosis present

## 2018-10-26 DIAGNOSIS — E119 Type 2 diabetes mellitus without complications: Secondary | ICD-10-CM | POA: Diagnosis not present

## 2018-10-26 DIAGNOSIS — F329 Major depressive disorder, single episode, unspecified: Secondary | ICD-10-CM | POA: Diagnosis present

## 2018-10-26 DIAGNOSIS — F17210 Nicotine dependence, cigarettes, uncomplicated: Secondary | ICD-10-CM | POA: Diagnosis not present

## 2018-10-26 DIAGNOSIS — J449 Chronic obstructive pulmonary disease, unspecified: Secondary | ICD-10-CM | POA: Diagnosis not present

## 2018-10-26 DIAGNOSIS — Z419 Encounter for procedure for purposes other than remedying health state, unspecified: Secondary | ICD-10-CM

## 2018-10-26 DIAGNOSIS — K219 Gastro-esophageal reflux disease without esophagitis: Secondary | ICD-10-CM | POA: Diagnosis not present

## 2018-10-26 DIAGNOSIS — M810 Age-related osteoporosis without current pathological fracture: Secondary | ICD-10-CM | POA: Diagnosis present

## 2018-10-26 DIAGNOSIS — G959 Disease of spinal cord, unspecified: Secondary | ICD-10-CM | POA: Diagnosis present

## 2018-10-26 DIAGNOSIS — E785 Hyperlipidemia, unspecified: Secondary | ICD-10-CM | POA: Diagnosis present

## 2018-10-26 DIAGNOSIS — M4802 Spinal stenosis, cervical region: Secondary | ICD-10-CM | POA: Diagnosis not present

## 2018-10-26 DIAGNOSIS — M5412 Radiculopathy, cervical region: Secondary | ICD-10-CM | POA: Diagnosis not present

## 2018-10-26 DIAGNOSIS — M532X2 Spinal instabilities, cervical region: Secondary | ICD-10-CM | POA: Diagnosis not present

## 2018-10-26 DIAGNOSIS — M1990 Unspecified osteoarthritis, unspecified site: Secondary | ICD-10-CM | POA: Diagnosis not present

## 2018-10-26 DIAGNOSIS — M4712 Other spondylosis with myelopathy, cervical region: Secondary | ICD-10-CM | POA: Diagnosis not present

## 2018-10-26 DIAGNOSIS — Z91030 Bee allergy status: Secondary | ICD-10-CM

## 2018-10-26 DIAGNOSIS — M4322 Fusion of spine, cervical region: Secondary | ICD-10-CM | POA: Diagnosis not present

## 2018-10-26 DIAGNOSIS — Z79899 Other long term (current) drug therapy: Secondary | ICD-10-CM | POA: Diagnosis not present

## 2018-10-26 DIAGNOSIS — M542 Cervicalgia: Secondary | ICD-10-CM | POA: Diagnosis not present

## 2018-10-26 DIAGNOSIS — Z96653 Presence of artificial knee joint, bilateral: Secondary | ICD-10-CM | POA: Diagnosis not present

## 2018-10-26 HISTORY — PX: ANTERIOR CERVICAL DECOMP/DISCECTOMY FUSION: SHX1161

## 2018-10-26 LAB — GLUCOSE, CAPILLARY
Glucose-Capillary: 120 mg/dL — ABNORMAL HIGH (ref 70–99)
Glucose-Capillary: 151 mg/dL — ABNORMAL HIGH (ref 70–99)
Glucose-Capillary: 153 mg/dL — ABNORMAL HIGH (ref 70–99)
Glucose-Capillary: 209 mg/dL — ABNORMAL HIGH (ref 70–99)

## 2018-10-26 SURGERY — ANTERIOR CERVICAL DECOMPRESSION/DISCECTOMY FUSION 3 LEVELS
Anesthesia: General

## 2018-10-26 MED ORDER — SUGAMMADEX SODIUM 200 MG/2ML IV SOLN
INTRAVENOUS | Status: DC | PRN
  Administered 2018-10-26: 200 mg via INTRAVENOUS

## 2018-10-26 MED ORDER — HYDROMORPHONE HCL 1 MG/ML IJ SOLN: 0.2500 mg | INTRAMUSCULAR | Status: DC | PRN

## 2018-10-26 MED ORDER — PROPOFOL 10 MG/ML IV BOLUS
INTRAVENOUS | Status: DC | PRN
  Administered 2018-10-26: 100 mg via INTRAVENOUS

## 2018-10-26 MED ORDER — ADULT MULTIVITAMIN W/MINERALS CH
1.0000 | ORAL_TABLET | Freq: Every day | ORAL | Status: DC
  Administered 2018-10-27: 1 via ORAL
  Filled 2018-10-26: qty 1

## 2018-10-26 MED ORDER — AMITRIPTYLINE HCL 50 MG PO TABS
100.0000 mg | ORAL_TABLET | Freq: Every day | ORAL | Status: DC
  Administered 2018-10-26: 100 mg via ORAL
  Filled 2018-10-26: qty 2

## 2018-10-26 MED ORDER — SUGAMMADEX SODIUM 200 MG/2ML IV SOLN
INTRAVENOUS | Status: AC
  Filled 2018-10-26: qty 2

## 2018-10-26 MED ORDER — LIDOCAINE 2% (20 MG/ML) 5 ML SYRINGE
INTRAMUSCULAR | Status: AC
  Filled 2018-10-26: qty 5

## 2018-10-26 MED ORDER — BUPROPION HCL ER (XL) 150 MG PO TB24
150.0000 mg | ORAL_TABLET | Freq: Every day | ORAL | Status: DC
  Administered 2018-10-27: 150 mg via ORAL
  Filled 2018-10-26: qty 1

## 2018-10-26 MED ORDER — THROMBIN (RECOMBINANT) 20000 UNITS EX SOLR
CUTANEOUS | Status: AC
  Filled 2018-10-26: qty 20000

## 2018-10-26 MED ORDER — LACTATED RINGERS IV SOLN
INTRAVENOUS | Status: DC | PRN
  Administered 2018-10-26 (×2): via INTRAVENOUS

## 2018-10-26 MED ORDER — DEXAMETHASONE SODIUM PHOSPHATE 10 MG/ML IJ SOLN
INTRAMUSCULAR | Status: AC
  Filled 2018-10-26: qty 1

## 2018-10-26 MED ORDER — ALBUTEROL SULFATE (2.5 MG/3ML) 0.083% IN NEBU: 3.0000 mL | INHALATION_SOLUTION | RESPIRATORY_TRACT | Status: DC | PRN

## 2018-10-26 MED ORDER — SODIUM CHLORIDE 0.9% FLUSH: 3.0000 mL | INTRAVENOUS | Status: DC | PRN

## 2018-10-26 MED ORDER — LIDOCAINE 2% (20 MG/ML) 5 ML SYRINGE
INTRAMUSCULAR | Status: DC | PRN
  Administered 2018-10-26: 100 mg via INTRAVENOUS

## 2018-10-26 MED ORDER — SODIUM CHLORIDE 0.9 % IV SOLN
INTRAVENOUS | Status: DC | PRN
  Administered 2018-10-26: 500 mL

## 2018-10-26 MED ORDER — 0.9 % SODIUM CHLORIDE (POUR BTL) OPTIME
TOPICAL | Status: DC | PRN
  Administered 2018-10-26 (×2): 1000 mL

## 2018-10-26 MED ORDER — MENTHOL 3 MG MT LOZG
1.0000 | LOZENGE | OROMUCOSAL | Status: DC | PRN
  Filled 2018-10-26: qty 9

## 2018-10-26 MED ORDER — PHENYLEPHRINE 40 MCG/ML (10ML) SYRINGE FOR IV PUSH (FOR BLOOD PRESSURE SUPPORT)
PREFILLED_SYRINGE | INTRAVENOUS | Status: AC
  Filled 2018-10-26: qty 10

## 2018-10-26 MED ORDER — CEFAZOLIN SODIUM-DEXTROSE 2-4 GM/100ML-% IV SOLN
2.0000 g | Freq: Three times a day (TID) | INTRAVENOUS | Status: AC
  Administered 2018-10-26 – 2018-10-27 (×2): 2 g via INTRAVENOUS
  Filled 2018-10-26 (×2): qty 100

## 2018-10-26 MED ORDER — SUCCINYLCHOLINE CHLORIDE 200 MG/10ML IV SOSY
PREFILLED_SYRINGE | INTRAVENOUS | Status: AC
  Filled 2018-10-26: qty 10

## 2018-10-26 MED ORDER — PROPOFOL 10 MG/ML IV BOLUS
INTRAVENOUS | Status: AC
  Filled 2018-10-26: qty 20

## 2018-10-26 MED ORDER — CHLORHEXIDINE GLUCONATE CLOTH 2 % EX PADS: 6.0000 | MEDICATED_PAD | Freq: Once | CUTANEOUS | Status: DC

## 2018-10-26 MED ORDER — DEXAMETHASONE SODIUM PHOSPHATE 10 MG/ML IJ SOLN
10.0000 mg | INTRAMUSCULAR | Status: AC
  Administered 2018-10-26: 10 mg via INTRAVENOUS
  Filled 2018-10-26: qty 1

## 2018-10-26 MED ORDER — PHENYLEPHRINE HCL 10 MG/ML IJ SOLN
INTRAMUSCULAR | Status: DC | PRN
  Administered 2018-10-26: 80 ug via INTRAVENOUS
  Administered 2018-10-26: 40 ug via INTRAVENOUS

## 2018-10-26 MED ORDER — MIDAZOLAM HCL 2 MG/2ML IJ SOLN
INTRAMUSCULAR | Status: AC
  Filled 2018-10-26: qty 2

## 2018-10-26 MED ORDER — THROMBIN 20000 UNITS EX SOLR
CUTANEOUS | Status: DC | PRN
  Administered 2018-10-26: 13:00:00 via TOPICAL

## 2018-10-26 MED ORDER — CALCIUM POLYCARBOPHIL 625 MG PO TABS
625.0000 mg | ORAL_TABLET | Freq: Every day | ORAL | Status: DC
  Administered 2018-10-27: 625 mg via ORAL
  Filled 2018-10-26: qty 1

## 2018-10-26 MED ORDER — CLONAZEPAM 0.5 MG PO TABS: 0.2500 mg | ORAL_TABLET | Freq: Two times a day (BID) | ORAL | Status: DC | PRN

## 2018-10-26 MED ORDER — INFLUENZA VAC SPLIT HIGH-DOSE 0.5 ML IM SUSY
0.5000 mL | PREFILLED_SYRINGE | INTRAMUSCULAR | Status: DC
  Filled 2018-10-26: qty 0.5

## 2018-10-26 MED ORDER — ONDANSETRON HCL 4 MG PO TABS: 4.0000 mg | ORAL_TABLET | Freq: Four times a day (QID) | ORAL | Status: DC | PRN

## 2018-10-26 MED ORDER — CYCLOBENZAPRINE HCL 10 MG PO TABS
ORAL_TABLET | ORAL | Status: AC
  Filled 2018-10-26: qty 1

## 2018-10-26 MED ORDER — DIPHENHYDRAMINE HCL 25 MG PO CAPS
25.0000 mg | ORAL_CAPSULE | Freq: Two times a day (BID) | ORAL | Status: DC
  Administered 2018-10-26 – 2018-10-27 (×2): 25 mg via ORAL
  Filled 2018-10-26 (×2): qty 1

## 2018-10-26 MED ORDER — ONDANSETRON HCL 4 MG/2ML IJ SOLN
INTRAMUSCULAR | Status: DC | PRN
  Administered 2018-10-26 (×2): 4 mg via INTRAVENOUS

## 2018-10-26 MED ORDER — OXYCODONE HCL 5 MG PO TABS
10.0000 mg | ORAL_TABLET | ORAL | Status: DC | PRN
  Administered 2018-10-26 – 2018-10-27 (×5): 10 mg via ORAL
  Filled 2018-10-26 (×4): qty 2

## 2018-10-26 MED ORDER — SODIUM CHLORIDE 0.9 % IV SOLN: 250.0000 mL | INTRAVENOUS | Status: DC

## 2018-10-26 MED ORDER — THROMBIN 5000 UNITS EX SOLR
OROMUCOSAL | Status: DC | PRN
  Administered 2018-10-26 (×2): via TOPICAL

## 2018-10-26 MED ORDER — ALUM & MAG HYDROXIDE-SIMETH 200-200-20 MG/5ML PO SUSP: 30.0000 mL | Freq: Four times a day (QID) | ORAL | Status: DC | PRN

## 2018-10-26 MED ORDER — ONDANSETRON HCL 4 MG/2ML IJ SOLN
INTRAMUSCULAR | Status: AC
  Filled 2018-10-26: qty 4

## 2018-10-26 MED ORDER — FENTANYL CITRATE (PF) 250 MCG/5ML IJ SOLN
INTRAMUSCULAR | Status: AC
  Filled 2018-10-26: qty 5

## 2018-10-26 MED ORDER — PANTOPRAZOLE SODIUM 40 MG PO TBEC
40.0000 mg | DELAYED_RELEASE_TABLET | Freq: Every day | ORAL | Status: DC
  Administered 2018-10-27: 40 mg via ORAL
  Filled 2018-10-26: qty 1

## 2018-10-26 MED ORDER — GUAIFENESIN ER 600 MG PO TB12
600.0000 mg | ORAL_TABLET | Freq: Two times a day (BID) | ORAL | Status: DC | PRN
  Filled 2018-10-26: qty 1

## 2018-10-26 MED ORDER — CALCIUM CARBONATE ANTACID 500 MG PO CHEW: 2.0000 | CHEWABLE_TABLET | Freq: Every day | ORAL | Status: DC | PRN

## 2018-10-26 MED ORDER — ROCURONIUM BROMIDE 50 MG/5ML IV SOSY
PREFILLED_SYRINGE | INTRAVENOUS | Status: AC
  Filled 2018-10-26: qty 5

## 2018-10-26 MED ORDER — HYDROMORPHONE HCL 1 MG/ML IJ SOLN: 0.5000 mg | INTRAMUSCULAR | Status: DC | PRN

## 2018-10-26 MED ORDER — NAPROXEN 250 MG PO TABS
250.0000 mg | ORAL_TABLET | Freq: Every day | ORAL | Status: DC
  Administered 2018-10-27: 250 mg via ORAL
  Filled 2018-10-26: qty 1

## 2018-10-26 MED ORDER — CYCLOBENZAPRINE HCL 10 MG PO TABS
10.0000 mg | ORAL_TABLET | Freq: Three times a day (TID) | ORAL | Status: DC | PRN
  Administered 2018-10-26: 10 mg via ORAL

## 2018-10-26 MED ORDER — VITAMIN D 25 MCG (1000 UNIT) PO TABS
1000.0000 [IU] | ORAL_TABLET | Freq: Every day | ORAL | Status: DC
  Administered 2018-10-27: 1000 [IU] via ORAL
  Filled 2018-10-26: qty 1

## 2018-10-26 MED ORDER — ACETAMINOPHEN 500 MG PO TABS: 500.0000 mg | ORAL_TABLET | Freq: Every day | ORAL | Status: DC | PRN

## 2018-10-26 MED ORDER — ROSUVASTATIN CALCIUM 20 MG PO TABS
40.0000 mg | ORAL_TABLET | Freq: Every day | ORAL | Status: DC
  Administered 2018-10-26: 40 mg via ORAL
  Filled 2018-10-26: qty 2

## 2018-10-26 MED ORDER — ROCURONIUM BROMIDE 50 MG/5ML IV SOSY
PREFILLED_SYRINGE | INTRAVENOUS | Status: DC | PRN
  Administered 2018-10-26: 50 mg via INTRAVENOUS

## 2018-10-26 MED ORDER — ACETAMINOPHEN 650 MG RE SUPP: 650.0000 mg | RECTAL | Status: DC | PRN

## 2018-10-26 MED ORDER — CEFAZOLIN SODIUM-DEXTROSE 2-4 GM/100ML-% IV SOLN
2.0000 g | INTRAVENOUS | Status: AC
  Administered 2018-10-26: 2 g via INTRAVENOUS
  Filled 2018-10-26: qty 100

## 2018-10-26 MED ORDER — ACETAMINOPHEN 325 MG PO TABS
650.0000 mg | ORAL_TABLET | ORAL | Status: DC | PRN
  Administered 2018-10-27: 650 mg via ORAL
  Filled 2018-10-26: qty 2

## 2018-10-26 MED ORDER — OXYCODONE HCL 5 MG PO TABS
ORAL_TABLET | ORAL | Status: AC
  Filled 2018-10-26: qty 2

## 2018-10-26 MED ORDER — FENTANYL CITRATE (PF) 100 MCG/2ML IJ SOLN
INTRAMUSCULAR | Status: DC | PRN
  Administered 2018-10-26: 150 ug via INTRAVENOUS
  Administered 2018-10-26 (×7): 50 ug via INTRAVENOUS

## 2018-10-26 MED ORDER — ASPIRIN EC 81 MG PO TBEC
81.0000 mg | DELAYED_RELEASE_TABLET | Freq: Every day | ORAL | Status: DC
  Administered 2018-10-27: 81 mg via ORAL
  Filled 2018-10-26: qty 1

## 2018-10-26 MED ORDER — FLUOXETINE HCL 20 MG PO CAPS
40.0000 mg | ORAL_CAPSULE | Freq: Two times a day (BID) | ORAL | Status: DC
  Administered 2018-10-26 – 2018-10-27 (×2): 40 mg via ORAL
  Filled 2018-10-26 (×2): qty 2

## 2018-10-26 MED ORDER — PANTOPRAZOLE SODIUM 40 MG IV SOLR: 40.0000 mg | Freq: Every day | INTRAVENOUS | Status: DC

## 2018-10-26 MED ORDER — ONDANSETRON HCL 4 MG/2ML IJ SOLN: 4.0000 mg | Freq: Four times a day (QID) | INTRAMUSCULAR | Status: DC | PRN

## 2018-10-26 MED ORDER — THROMBIN 5000 UNITS EX SOLR
CUTANEOUS | Status: AC
  Filled 2018-10-26: qty 5000

## 2018-10-26 MED ORDER — CYCLOBENZAPRINE HCL 10 MG PO TABS: 10.0000 mg | ORAL_TABLET | Freq: Three times a day (TID) | ORAL | Status: DC | PRN

## 2018-10-26 MED ORDER — CRANBERRY 400 MG PO CAPS: 800.0000 mg | ORAL_CAPSULE | Freq: Every day | ORAL | Status: DC | PRN

## 2018-10-26 MED ORDER — SODIUM CHLORIDE 0.9% FLUSH: 3.0000 mL | Freq: Two times a day (BID) | INTRAVENOUS | Status: DC

## 2018-10-26 MED ORDER — METOPROLOL TARTRATE 12.5 MG HALF TABLET
12.5000 mg | ORAL_TABLET | Freq: Two times a day (BID) | ORAL | Status: DC
  Administered 2018-10-26 – 2018-10-27 (×2): 12.5 mg via ORAL
  Filled 2018-10-26 (×2): qty 1

## 2018-10-26 MED ORDER — SIMETHICONE 80 MG PO CHEW
120.0000 mg | CHEWABLE_TABLET | Freq: Every day | ORAL | Status: DC | PRN
  Filled 2018-10-26: qty 2

## 2018-10-26 MED ORDER — PHENOL 1.4 % MT LIQD: 1.0000 | OROMUCOSAL | Status: DC | PRN

## 2018-10-26 MED ORDER — MAGNESIUM OXIDE 400 (241.3 MG) MG PO TABS
400.0000 mg | ORAL_TABLET | Freq: Every day | ORAL | Status: DC
  Administered 2018-10-27: 400 mg via ORAL
  Filled 2018-10-26: qty 1

## 2018-10-26 MED ORDER — VITAMIN C 500 MG PO TABS
1000.0000 mg | ORAL_TABLET | Freq: Every day | ORAL | Status: DC
  Administered 2018-10-27: 1000 mg via ORAL
  Filled 2018-10-26: qty 2

## 2018-10-26 SURGICAL SUPPLY — 72 items
BAG DECANTER FOR FLEXI CONT (MISCELLANEOUS) ×2 IMPLANT
BASKET BONE COLLECTION (BASKET) IMPLANT
BENZOIN TINCTURE PRP APPL 2/3 (GAUZE/BANDAGES/DRESSINGS) ×2 IMPLANT
BIT DRILL 13 (BIT) ×2 IMPLANT
BIT DRILL NEURO 2X3.1 SFT TUCH (MISCELLANEOUS) ×1 IMPLANT
BONE VIVIGEN FORMABLE 1.3CC (Bone Implant) ×4 IMPLANT
BUR MATCHSTICK NEURO 3.0 LAGG (BURR) ×2 IMPLANT
CANISTER SUCT 3000ML PPV (MISCELLANEOUS) ×2 IMPLANT
CARTRIDGE OIL MAESTRO DRILL (MISCELLANEOUS) ×1 IMPLANT
COVER WAND RF STERILE (DRAPES) IMPLANT
DECANTER SPIKE VIAL GLASS SM (MISCELLANEOUS) ×2 IMPLANT
DERMABOND ADVANCED (GAUZE/BANDAGES/DRESSINGS) ×1
DERMABOND ADVANCED .7 DNX12 (GAUZE/BANDAGES/DRESSINGS) ×1 IMPLANT
DIFFUSER DRILL AIR PNEUMATIC (MISCELLANEOUS) ×2 IMPLANT
DRAIN SNY WOU 7FLT (WOUND CARE) ×2 IMPLANT
DRAPE C-ARM 42X72 X-RAY (DRAPES) ×4 IMPLANT
DRAPE LAPAROTOMY 100X72 PEDS (DRAPES) ×2 IMPLANT
DRAPE MICROSCOPE LEICA (MISCELLANEOUS) ×2 IMPLANT
DRILL NEURO 2X3.1 SOFT TOUCH (MISCELLANEOUS) ×2
DRSG OPSITE POSTOP 4X6 (GAUZE/BANDAGES/DRESSINGS) ×2 IMPLANT
DURAPREP 6ML APPLICATOR 50/CS (WOUND CARE) ×2 IMPLANT
ELECT COATED BLADE 2.86 ST (ELECTRODE) ×2 IMPLANT
ELECT REM PT RETURN 9FT ADLT (ELECTROSURGICAL) ×2
ELECTRODE REM PT RTRN 9FT ADLT (ELECTROSURGICAL) ×1 IMPLANT
EVACUATOR SILICONE 100CC (DRAIN) ×2 IMPLANT
GAUZE 4X4 16PLY RFD (DISPOSABLE) IMPLANT
GAUZE SPONGE 4X4 12PLY STRL (GAUZE/BANDAGES/DRESSINGS) ×2 IMPLANT
GLOVE BIO SURGEON STRL SZ7 (GLOVE) IMPLANT
GLOVE BIO SURGEON STRL SZ8 (GLOVE) ×2 IMPLANT
GLOVE BIOGEL PI IND STRL 6.5 (GLOVE) ×2 IMPLANT
GLOVE BIOGEL PI IND STRL 7.0 (GLOVE) IMPLANT
GLOVE BIOGEL PI IND STRL 7.5 (GLOVE) ×1 IMPLANT
GLOVE BIOGEL PI INDICATOR 6.5 (GLOVE) ×2
GLOVE BIOGEL PI INDICATOR 7.0 (GLOVE)
GLOVE BIOGEL PI INDICATOR 7.5 (GLOVE) ×1
GLOVE EXAM NITRILE XL STR (GLOVE) IMPLANT
GLOVE INDICATOR 8.5 STRL (GLOVE) ×2 IMPLANT
GLOVE SS BIOGEL STRL SZ 7 (GLOVE) ×2 IMPLANT
GLOVE SUPERSENSE BIOGEL SZ 7 (GLOVE) ×2
GLOVE SURG SS PI 6.0 STRL IVOR (GLOVE) ×8 IMPLANT
GOWN STRL REUS W/ TWL LRG LVL3 (GOWN DISPOSABLE) ×4 IMPLANT
GOWN STRL REUS W/ TWL XL LVL3 (GOWN DISPOSABLE) ×1 IMPLANT
GOWN STRL REUS W/TWL 2XL LVL3 (GOWN DISPOSABLE) IMPLANT
GOWN STRL REUS W/TWL LRG LVL3 (GOWN DISPOSABLE) ×4
GOWN STRL REUS W/TWL XL LVL3 (GOWN DISPOSABLE) ×1
HALTER HD/CHIN CERV TRACTION D (MISCELLANEOUS) ×2 IMPLANT
HEMOSTAT POWDER KIT SURGIFOAM (HEMOSTASIS) ×4 IMPLANT
KIT BASIN OR (CUSTOM PROCEDURE TRAY) ×2 IMPLANT
KIT TURNOVER KIT B (KITS) ×2 IMPLANT
NEEDLE SPNL 20GX3.5 QUINCKE YW (NEEDLE) ×2 IMPLANT
NS IRRIG 1000ML POUR BTL (IV SOLUTION) ×4 IMPLANT
OIL CARTRIDGE MAESTRO DRILL (MISCELLANEOUS) ×2
PACK LAMINECTOMY NEURO (CUSTOM PROCEDURE TRAY) ×2 IMPLANT
PIN DISTRACTION 14MM (PIN) ×4 IMPLANT
PLATE 3 57.5XLCK NS SPNE CVD (Plate) ×1 IMPLANT
PLATE 3 ATLANTIS TRANS (Plate) ×1 IMPLANT
RUBBERBAND STERILE (MISCELLANEOUS) ×4 IMPLANT
SCREW 4.5X13MM (Screw) ×4 IMPLANT
SCREW ST FIX 4 ATL 3120213 (Screw) ×12 IMPLANT
SPACER ACDF TPS LG PARALLEL 7 (Spacer) ×6 IMPLANT
SPONGE INTESTINAL PEANUT (DISPOSABLE) ×4 IMPLANT
SPONGE SURGIFOAM ABS GEL 100 (HEMOSTASIS) ×2 IMPLANT
STRIP CLOSURE SKIN 1/2X4 (GAUZE/BANDAGES/DRESSINGS) ×2 IMPLANT
SUT VIC AB 3-0 SH 8-18 (SUTURE) ×4 IMPLANT
SUT VIC AB 4-0 PS2 27 (SUTURE) ×2 IMPLANT
SYR CONTROL 10ML LL (SYRINGE) ×2 IMPLANT
TAPE CLOTH 4X10 WHT NS (GAUZE/BANDAGES/DRESSINGS) ×2 IMPLANT
TOWEL GREEN STERILE (TOWEL DISPOSABLE) ×2 IMPLANT
TOWEL GREEN STERILE FF (TOWEL DISPOSABLE) ×2 IMPLANT
TRAP SPECIMEN MUCOUS 40CC (MISCELLANEOUS) ×2 IMPLANT
TRAY FOLEY MTR SLVR 16FR STAT (SET/KITS/TRAYS/PACK) ×2 IMPLANT
WATER STERILE IRR 1000ML POUR (IV SOLUTION) ×2 IMPLANT

## 2018-10-26 NOTE — H&P (Signed)
Amanda [name] is an 67 y.o. female.   Chief Complaint: neck pain and tingling weakness HPI: 67 year female progress worsening neck pain bilateral shoulder and arm pain weakness numbness tingling her arms and difficult to walking. Workup revealed severe cervical spondylosis stenosis cord compression at C4-5 C5-6 and C6-7. Due to patient's progression of clinical syndrome imaging findings and failure conservative treatment I recommended anterior cervical discectomy discectomies and fusion at those levels. I extensively reviewed the risks and benefits of the operation the patient as well as perioperative course expectations of outcome and alternatives of surgery and she understood and agreed to proceed forward.  Past Medical History:  Diagnosis Date  . 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 sting    bee stings  . Anxiety   . Aortic stenosis    a. 2013: nl LV sys fxn, mild MR, no evidence of pulm htn; b. TTE 8/17: EF 60-65%, no RWMA, nl LV dia fxn,  mild AS, mod AI  . COPD (chronic obstructive pulmonary disease) ([place]) 10/30/2016  . Degenerative arthritis of knee, bilateral 06/16/2015  . Depression   . Diabetes ([place])    diet managed-type 11  . GERD (gastroesophageal reflux disease)   . Heart murmur   . Hiatal hernia with gastroesophageal reflux 1997  . Hyperlipidemia   . Hypertension   . Neuromuscular disorder ([place])    nerve damage in back and shoulder  . Osteoporosis   . Palpitations    a. 03/2012: 48-hour Holter monitor showed intermittent sinus tachycardia with occasional PACs, without other significant arrhythmia  . Thyroid disease    hyperthyroidism as a teenager - received some injections age 30 that corrected this   . Tobacco abuse     Past Surgical History:  Procedure Laterality Date  . ABDOMINAL HYSTERECTOMY  1993  . APPENDECTOMY    . BACK SURGERY    . BREAST BIOPSY Right 2007  . CHOLECYSTECTOMY    . COLONOSCOPY     30 years ago was normal per pt.   Marland Kitchen DILATION AND CURETTAGE OF UTERUS    .  ESOPHAGEAL DILATION     x 3  . fractured leg Right   . OVARIAN CYST REMOVAL  1972  . SALIVARY GLAND SURGERY    . TONSILLECTOMY     4 yoa  . TOTAL KNEE ARTHROPLASTY Left 12/24/2015   Procedure: LEFT TOTAL KNEE ARTHROPLASTY;  Surgeon: [name], MD;  Location: [facility];  Service: Orthopedics;  Laterality: Left;  . TOTAL KNEE ARTHROPLASTY Right 12/30/2017   Procedure: RIGHT TOTAL KNEE ARTHROPLASTY;  Surgeon: [name], MD;  Location: [facility];  Service: Orthopedics;  Laterality: Right;  . WRIST FRACTURE SURGERY  11/2008    Family History  Problem Relation Age of Onset  . Hyperlipidemia Mother   . Arthritis Mother   . Diabetes Mother   . Heart disease Mother   . Colon polyps Mother   . Cancer Paternal Grandmother        breast  . Hypercholesterolemia Sister   . Colon polyps Sister   . Breast cancer Sister   . Colon cancer Neg Hx   . Esophageal cancer Neg Hx   . Rectal cancer Neg Hx   . Stomach cancer Neg Hx   . Pancreatic cancer Neg Hx    Social History:  reports that she has been smoking cigarettes. She has a 33.75 pack-year smoking history. She has never used smokeless tobacco. She reports that she drinks alcohol. She reports that she does not  use drugs.  Allergies:  Allergies  Allergen Reactions  . Diazepam Other (See Comments)    REACTION: pt states she gets very angry and abusive verbally on this medication     Medications Prior to Admission  Medication Sig Dispense Refill  . acetaminophen (TYLENOL) 500 MG tablet Take 500 mg by mouth daily as needed for headache.    . albuterol (PROVENTIL HFA;VENTOLIN HFA) 108 (90 Base) MCG/ACT inhaler Inhale 2 puffs into the lungs every 4 (four) hours as needed for wheezing or shortness of breath. 1 Inhaler 5  . amitriptyline (ELAVIL) 50 MG tablet TAKE TWO TABLETS BY MOUTH AT NIGHT 180 tablet 3  . Ascorbic Acid (VITAMIN C) 1000 MG tablet Take 1,000 mg by mouth daily.    Marland Kitchen aspirin EC 81 MG tablet Take 81 mg by mouth daily.     Marland Kitchen buPROPion (WELLBUTRIN XL) 150 MG 24 hr tablet TAKE 1 TABLET BY MOUTH ONCE DAILY 90 tablet 0  . calcium carbonate (TUMS - DOSED IN MG ELEMENTAL CALCIUM) 500 MG chewable tablet Chew 2 tablets by mouth daily as needed for indigestion or heartburn.    . Cholecalciferol (VITAMIN D) 1000 UNITS capsule Take 1,000 Units by mouth daily.     . clonazePAM (KLONOPIN) 0.5 MG tablet TAKE 1 TABLET BY MOUTH TWICE DAILY AS NEEDED (Patient taking differently: Take 0.25 mg by mouth 2 (two) times daily as needed for anxiety. ) 30 tablet 0  . Cranberry 400 MG CAPS Take 800 mg by mouth daily as needed (uti symptoms).    . cyclobenzaprine (FLEXERIL) 10 MG tablet TAKE ONE TABLET BY MOUTH THREE TIMES DAILY AS NEEDED FOR MUSCLE SPASM 50 tablet 4  . diphenhydrAMINE (BENADRYL) 25 MG tablet Take 25 mg by mouth 2 (two) times daily.    Marland Kitchen FLUoxetine (PROZAC) 40 MG capsule TAKE 1 CAPSULE BY MOUTH TWICE DAILY 180 capsule 0  . guaiFENesin (MUCINEX) 600 MG 12 hr tablet Take 600 mg by mouth 2 (two) times daily as needed (congestion).    . magnesium oxide (MAG-OX) 400 MG tablet Take 400 mg by mouth daily.    . metoprolol tartrate (LOPRESSOR) 25 MG tablet TAKE 1/2 (ONE-HALF) TABLET BY MOUTH TWICE DAILY (Patient taking differently: Take 12.5 mg by mouth 2 (two) times daily. ) 90 tablet 0  . Multiple Vitamin (MULTIVITAMIN) tablet Take 1 tablet by mouth daily.    . naproxen sodium (ALEVE) 220 MG tablet Take 220 mg by mouth daily.    . pantoprazole (PROTONIX) 40 MG tablet Take 40 mg by mouth daily before breakfast.     . polycarbophil (EQ FIBER THERAPY) 625 MG tablet Take 625 mg by mouth daily. FIBER THERAPY    . rosuvastatin (CRESTOR) 40 MG tablet Take 1 tablet (40 mg total) by mouth daily. (Patient taking differently: Take 40 mg by mouth at bedtime. ) 90 tablet 1  . Simethicone 125 MG CAPS Take 125 mg by mouth daily as needed (gas).    . lansoprazole (PREVACID) 30 MG capsule Take 1 capsule (30 mg total) by mouth daily at 12 noon.  (Patient not taking: Reported on 10/17/2018) 90 capsule 3    Results for orders placed or performed during the hospital encounter of 10/26/18 (from the past 48 hour(s))  Glucose, capillary     Status: Abnormal   Collection Time: 10/26/18  9:29 AM  Result Value Ref Range   Glucose-Capillary 120 (H) 70 - 99 mg/dL   Comment 1 Notify RN    Comment 2 Document  in Chart    No results found.  Review of Systems  Musculoskeletal: Positive for neck pain.  Neurological: Positive for tingling and sensory change.    Blood pressure (!) 120/52, pulse 74, temperature 98.3 F (36.8 C), temperature source Oral, resp. rate 20, height 5\' 8"  (1.727 m), weight 99.8 kg, SpO2 96 %. Physical Exam   Assessment/Plan 67 year female presents for an ACDF C4-C7  Lorinda Copland P, MD 10/26/2018, 10:53 AM

## 2018-10-26 NOTE — Anesthesia Procedure Notes (Signed)
Arterial Line Insertion Start/End11/20/2019 10:15 AM, 10/26/2018 10:27 AM Performed by: Josephine Igo, CRNA, CRNA  Patient location: Pre-op. Preanesthetic checklist: patient identified, IV checked, surgical consent, monitors and equipment checked and pre-op evaluation Lidocaine 1% used for infiltration Right, radial was placed Catheter size: 20 G Hand hygiene performed , maximum sterile barriers used  and Seldinger technique used  Attempts: 1 Procedure performed without using ultrasound guided technique. Following insertion, dressing applied and Biopatch. Post procedure assessment: normal  Patient tolerated the procedure well with no immediate complications.

## 2018-10-26 NOTE — Transfer of Care (Signed)
Immediate Anesthesia Transfer of Care Note  Patient: Amanda [name]  Procedure(s) Performed: ACDF C4-C5 C5-C6 C6-C7 (N/A )  Patient Location: PACU  Anesthesia Type:General  Level of Consciousness: awake, alert , oriented and patient cooperative  Airway & Oxygen Therapy: Patient Spontanous Breathing and Patient connected to nasal cannula oxygen  Post-op Assessment: Report given to RN and Post -op Vital signs reviewed and stable  Post vital signs: Reviewed and stable  Last Vitals:  Vitals Value Taken Time  BP 142/62 10/26/2018  2:57 PM  Temp    Pulse 91 10/26/2018  3:00 PM  Resp 22 10/26/2018  3:00 PM  SpO2 94 % 10/26/2018  3:00 PM  Vitals shown include unvalidated device data.  Last Pain:  Vitals:   10/26/18 1021  TempSrc:   PainSc: 7       Patients Stated Pain Goal: 2 (71/16/57 9038)  Complications: No apparent anesthesia complications

## 2018-10-26 NOTE — Op Note (Signed)
Operative diagnosis: Cervical spondylitic myelopathy with radiculopathy from severe stenosis and instability C4-5, C5-6, C6-7.  Postoperative diagnosis: Same  Procedure: Anterior cervical discectomies and fusion C4-5, C5-6, C6-7 utilizing the globus peek cages packed with locally harvested autograft mixed with vivigen at all 3 levels and the Atlantis translational plating system  Surgeon: Dominica Severin Nickisha Hum  Asst.: Francisca December  Anesthesia: Gen.  EBL: Minimal  History of present illness: 67 year old female with long-standing neck pain bilateral shoulder and arm pain numbness tingling and weakness with left arm worse than right workup revealed spinal listhesis C4-5 severe stenosis and cord compression at C5-6 and C6-7. Due to patient's failure conservative treatment imaging findings and progression of clinical syndrome or recommended anterior cervical discectomies and fusion at those 3 levels. I extensively went over the risks and benefits of the operation with her as well as perioperative course expectations of outcome and alternatives surgery and she understood and agreed to proceed forward.  Operative procedure: Patient brought into the or was induced on general anesthesia positioned supine the neck in slight extension in 5 pounds of halter traction the right side of her neck was prepped and draped in routine sterile fashion. Preoperative x-ray localize the appropriate level so a curvilinear incision was made just off midlineto the anterior border of sternocleidomastoid and the superficial layer of the platysmas dissected and divided longitudinally the avascular plane between the sternomastoid and strap muscles was developed down to the. Fashion. Aggressive passive Kitners. Interoperative x-ray confirmed the location of the appropriate level so disc space was incised lungs close reflected laterally and self-retaining retractors placed. Attention was first taken the C4-5 and C5-6 large anterior ossified and  diffuse skeletal hyperostosis were immediately identified large anterior ossified 2 bitten off with Leksell rongeur and to the Kerrison punch. Both disc spaces were drilled down with a high-speed drill capturing the bone shavings in a mucous trap. Under microscopic illumination first working at C4-5 further drilling and aggressive under biting of both endplates allowed indication of posterior longitudinal ligament was was removed in piecemeal fashion. Large posterior spurs coming off the C5 4 and C5 vertebral bodies were all removed decompress the central canal and both C5 nerve roots. Both C5 pedicles were identified both C5 nerve roots were skeletonized flush with pedicle. Then at C5-6 and a similar fashion disc spaces markedly collapsed was drilled down aggressive abutting both endplates decompress the central canal below was removed in piecemeal fashion both C6 nerve roots were identified both C6 pedicles were also identified and the foramen was opened up and unroofed. At the end the discectomy at both levels was no further stenosis either centrally or foraminally so I packed to 7 mm peek cages with a locally harvested autograft mix and inserted 2 mm deep to the anterior vertebra line. I then repositioned retractor for C6-7 C6-7 was markedly osteophytic and collapsed identify the disc space opened up the disc space remove the anterior calcified ligament drill the disc space down capturing the bone shavings in a mucous trap and decompress the central canal and both C7 nerve roots. I inserted a 7 mm peek cage this level as well. I then sized up a 57-1/2 Atlantis translational plate I had used to rescue screws due to repositioning of the plate but also screws excellent purchase. Posterior fluoroscopy confirmed good position the implants due the patient's hyperlordosis seen presto plate was just a little bit problem C7 however the screws were captured the vertebral body and it felt like it was adjacent  to the  endplate at the junction of the endplate. All locking mechanisms were engaged wounds and Cobos irrigated meticulous he states was maintained a J-P drain was placed and the wounds closed in layers with interrupted Vicryl skin was closed running 4 subcuticular Dermabond benzo and Steri-Strips and a sterile dressing was applied and patient recovered in stable condition. At the end the case all needle counts sponge counts were correct.

## 2018-10-26 NOTE — Anesthesia Postprocedure Evaluation (Signed)
Anesthesia Post Note  Patient: Amanda [name]  Procedure(s) Performed: ACDF C4-C5 C5-C6 C6-C7 (N/A )     Patient location during evaluation: PACU Anesthesia Type: General Level of consciousness: awake and alert Pain management: pain level controlled Vital Signs Assessment: post-procedure vital signs reviewed and stable Respiratory status: spontaneous breathing, nonlabored ventilation, respiratory function stable and patient connected to nasal cannula oxygen Cardiovascular status: blood pressure returned to baseline and stable Postop Assessment: no apparent nausea or vomiting Anesthetic complications: no    Last Vitals:  Vitals:   10/26/18 1527 10/26/18 1543  BP: 127/66 (!) 133/57  Pulse: 86 81  Resp: (!) 23 14  Temp:    SpO2: 95% 96%    Last Pain:  Vitals:   10/26/18 1558  TempSrc:   PainSc: 5                  Jeffery Bachmeier,W. EDMOND

## 2018-10-26 NOTE — Progress Notes (Signed)
PHARMACIST - PHYSICIAN ORDER COMMUNICATION  CONCERNING: P&T Medication Policy on Herbal Medications  DESCRIPTION:  This patient's order for:  Cranberry 800 mg capsules  has been noted.  This product(s) is classified as an "herbal" or natural product. Due to a lack of definitive safety studies or FDA approval, nonstandard manufacturing practices, plus the potential risk of unknown drug-drug interactions while on inpatient medications, the Pharmacy and Therapeutics Committee does not permit the use of "herbal" or natural products of this type within [facility].   ACTION TAKEN: The pharmacy department is unable to verify this order at this time. Please reevaluate patient's clinical condition at discharge and address if the herbal or natural product(s) should be resumed at that time.   Kelvin Cellar, RPh Pager: [phone] 10/26/2018 4:53 PM

## 2018-10-26 NOTE — Anesthesia Preprocedure Evaluation (Addendum)
Anesthesia Evaluation  Patient identified by MRN, date of birth, ID band Patient awake    Reviewed: Allergy & Precautions, H&P , NPO status , Patient's Chart, lab work & pertinent test results, reviewed documented beta blocker date and time   Airway Mallampati: II  TM Distance: >3 FB Neck ROM: Full    Dental no notable dental hx. (+) Edentulous Upper, Edentulous Lower, Dental Advisory Given   Pulmonary COPD,  COPD inhaler, Current Smoker,    Pulmonary exam normal breath sounds clear to auscultation       Cardiovascular hypertension, Pt. on medications and Pt. on home beta blockers + Valvular Problems/Murmurs AS  Rhythm:Regular Rate:Normal     Neuro/Psych Anxiety Depression negative neurological ROS     GI/Hepatic Neg liver ROS, GERD  Medicated and Controlled,  Endo/Other  diabetes  Renal/GU negative Renal ROS  negative genitourinary   Musculoskeletal  (+) Arthritis , Osteoarthritis,    Abdominal   Peds  Hematology negative hematology ROS (+)   Anesthesia Other Findings   Reproductive/Obstetrics negative OB ROS                            Anesthesia Physical Anesthesia Plan  ASA: III  Anesthesia Plan: General   Post-op Pain Management:    Induction: Intravenous  PONV Risk Score and Plan: 3 and Ondansetron, Dexamethasone and Midazolam  Airway Management Planned: Oral ETT  Additional Equipment: Arterial line  Intra-op Plan:   Post-operative Plan: Extubation in OR  Informed Consent: I have reviewed the patients History and Physical, chart, labs and discussed the procedure including the risks, benefits and alternatives for the proposed anesthesia with the patient or authorized representative who has indicated his/her understanding and acceptance.   Dental advisory given  Plan Discussed with: CRNA  Anesthesia Plan Comments:        Anesthesia Quick Evaluation

## 2018-10-27 ENCOUNTER — Ambulatory Visit: Payer: Medicare Other | Admitting: Cardiovascular Disease

## 2018-10-27 LAB — GLUCOSE, CAPILLARY: Glucose-Capillary: 145 mg/dL — ABNORMAL HIGH (ref 70–99)

## 2018-10-27 MED ORDER — OXYCODONE HCL 10 MG PO TABS: 10.0000 mg | ORAL_TABLET | ORAL | 0 refills | Status: DC | PRN

## 2018-10-27 MED FILL — Gelatin Absorbable MT Powder: OROMUCOSAL | Qty: 1 | Status: AC

## 2018-10-27 MED FILL — Thrombin (Recombinant) For Soln 20000 Unit: CUTANEOUS | Qty: 1 | Status: AC

## 2018-10-27 MED FILL — Thrombin For Soln 5000 Unit: CUTANEOUS | Qty: 5000 | Status: AC

## 2018-10-27 NOTE — Progress Notes (Signed)
Physical Therapy Evaluation  Clinical Impression: Pt admitted with above diagnosis. Pt currently with functional limitations due to the deficits listed below (see PT Problem List). At the time of PT eval pt was able to perform transfers and ambulation with up to min assist for balance support and safety. Short term memory deficits made education difficult at times. Pt was educated on maintenance of precautions, cervical collar adjustments, car transfer, and general safety with activity progression. Pt will benefit from skilled PT to increase their independence and safety with mobility to allow discharge to the venue listed below.       10/27/18 0800  PT Visit Information  Last PT Received On 10/27/18  Assistance Needed +1  History of Present Illness Pt is a 67 y/o female who presents s/p C4-C7 ACDF on 10/26/18. PMH significant for osteoporosis, neuromuscular disorder (effecting back and shoulder), HTN, heart murmur, type 1 diabetes, COPD, aortic stenosis, B TKR, salivary gland removal.  Precautions  Precautions Fall;Cervical  Precaution Booklet Issued Yes (comment)  Precaution Comments VC's for maintenance of precautions during functional mobility.   Required Braces or Orthoses Cervical Brace  Cervical Brace Soft collar  Restrictions  Weight Bearing Restrictions No  Home Living  Family/patient expects to be discharged to: Private residence  Living Arrangements Spouse/significant other  Available Help at Discharge Family;Available 24 hours/day  Type of Home House  Home Access Level entry  Home Layout Two level  Alternate Level Stairs-Number of Steps 3 (to/from kitchen)  Alternate Level Stairs-Rails None  Bathroom Shower/Tub Tub/shower unit  Corporate treasurer Yes  Home Equipment Walker - 2 wheels;Cane - single point;Shower seat;Adaptive equipment  Adaptive Equipment Reacher;Sock aid;Long-handled shoe horn  Prior Function  Level of Independence  Independent  Comments Has not been using cane or RW. Cooks, cleans, independent with ADL's  Communication  Communication No difficulties  Pain Assessment  Pain Assessment Faces  Faces Pain Scale 4  Pain Location incision site, across chest (pecs), and latissimus area (bilaterally)  Pain Descriptors / Indicators Operative site guarding;Discomfort  Pain Intervention(s) Monitored during session  Cognition  Arousal/Alertness Awake/alert  Behavior During Therapy WFL for tasks assessed/performed  Overall Cognitive Status Impaired/Different from baseline  Area of Impairment Memory;Safety/judgement  Memory Decreased short-term memory;Decreased recall of precautions  Safety/Judgement Decreased awareness of safety  General Comments Pt with obvious STM deficits and appears internally distracted at times. Often began talking and forgot what she was going to say. Recognizes memory deficits and attributes this to her "neck" as well.   Upper Extremity Assessment  Upper Extremity Assessment Defer to OT evaluation  Lower Extremity Assessment  Lower Extremity Assessment Generalized weakness  Cervical / Trunk Assessment  Cervical / Trunk Assessment Other exceptions  Cervical / Trunk Exceptions s/p surgery  Bed Mobility  Overal bed mobility Needs Assistance  Bed Mobility Rolling;Sidelying to Sit  Rolling Supervision  Sidelying to sit Supervision  General bed mobility comments VC's for proper log roll technique. HOB flat and rails lowered to simulate home environment.   Transfers  Overall transfer level Needs assistance  Equipment used None  Transfers Sit to/from Stand  Sit to Stand Min guard  General transfer comment Hands-on guarding required for power-up to full stand. Increased time required for pt to gain/maintain standing balance.   Ambulation/Gait  Ambulation/Gait assistance Min assist  Gait Distance (Feet) 300 Feet  Assistive device None  Gait Pattern/deviations Step-through  pattern;Decreased stride length;Trunk flexed  General Gait Details VC's for improved posture and maintenance  of precautions throughout gait training. As pt fatigued, balance deficits were more prominent. Pt required min assist to recover from losses of balance.   Gait velocity Decreased  Gait velocity interpretation <1.31 ft/sec, indicative of household ambulator  Balance  Overall balance assessment Needs assistance  Sitting-balance support No upper extremity supported;Feet supported  Sitting balance-Leahy Scale Fair  Standing balance support No upper extremity supported;During functional activity  Standing balance-Leahy Scale Poor  Standing balance comment Required min assist  PT - End of Session  Equipment Utilized During Treatment Gait belt;Cervical collar  Activity Tolerance Patient tolerated treatment well  Patient left with call bell/phone within reach (Sitting EOB eating breakfast)  Nurse Communication Mobility status  PT Assessment  PT Recommendation/Assessment Patient needs continued PT services  PT Visit Diagnosis Unsteadiness on feet (R26.81);Pain;Difficulty in walking, not elsewhere classified (R26.2)  Pain - part of body  (neck)  PT Problem List Decreased strength;Decreased range of motion;Decreased activity tolerance;Decreased balance;Decreased mobility;Decreased knowledge of use of DME;Decreased safety awareness;Decreased knowledge of precautions;Decreased cognition;Pain  PT Plan  PT Frequency (ACUTE ONLY) Min 5X/week  PT Treatment/Interventions (ACUTE ONLY) DME instruction;Gait training;Stair training;Functional mobility training;Therapeutic activities;Therapeutic exercise;Neuromuscular re-education;Patient/family education  AM-PAC PT "6 Clicks" Daily Activity Outcome Measure  Difficulty turning over in bed (including adjusting bedclothes, sheets and blankets)? 4  Difficulty moving from lying on back to sitting on the side of the bed?  4  Difficulty sitting down on and  standing up from a chair with arms (e.g., wheelchair, bedside commode, etc,.)? 2  Help needed moving to and from a bed to chair (including a wheelchair)? 3  Help needed walking in hospital room? 3  Help needed climbing 3-5 steps with a railing?  3  6 Click Score 19  Mobility G Code  CJ  PT Recommendation  Follow Up Recommendations No PT follow up;Supervision for mobility/OOB  PT equipment None recommended by PT  Individuals Consulted  Consulted and Agree with Results and Recommendations Patient  Acute Rehab PT Goals  Patient Stated Goal To get back to normal  PT Goal Formulation With patient  Time For Goal Achievement 11/03/18  Potential to Achieve Goals Good  PT Time Calculation  PT Start Time (ACUTE ONLY) 0740  PT Stop Time (ACUTE ONLY) 6553  PT Time Calculation (min) (ACUTE ONLY) 32 min  PT General Charges  $$ ACUTE PT VISIT 1 Visit  PT Evaluation  $PT Eval Moderate Complexity 1 Mod  PT Treatments  $Gait Training 8-22 mins   Rolinda Roan, PT, DPT Acute Rehabilitation Services Pager: [phone] Office: [phone]

## 2018-10-27 NOTE — Discharge Summary (Signed)
Physician Discharge Summary  Patient ID: Amanda [name] MRN: [id number] DOB/AGE: 1951-06-12 67 y.o. Estimated body mass index is 33.45 kg/m as calculated from the following:   Height as of this encounter: 5\' 8"  (1.727 m).   Weight as of this encounter: 99.8 kg.   Admit date: 10/26/2018 Discharge date: 10/27/2018  Admission Diagnoses:cervical spondylitic myelopathy  Discharge Diagnoses: same Active Problems:   Myelopathy [facility])   Discharged Condition: good  Hospital Course: patient is medical Hospital underwent anterior cervical discectomies and fusion at C4-5, C5-6, C6-7. Postoperatively patient did very well recovered in the floor on the floor was angling and voiding spontaneously tolerating rigid diet stable for discharge home. Patient will be discharged was scheduled follow-upin 1-2 weeks.  Consults: Significant Diagnostic Studies: Treatments:ACDF C4-C7 Discharge Exam: Blood pressure (!) 128/55, pulse 78, temperature 98.4 F (36.9 C), temperature source Oral, resp. rate 18, height 5\' 8"  (1.727 m), weight 99.8 kg, SpO2 94 %. Strength 5 out of 5 wound clean dry and intact  Disposition: home   Allergies as of 10/27/2018      Reactions   Diazepam Other (See Comments)   REACTION: pt states she gets very angry and abusive verbally on this medication      Medication List    TAKE these medications   acetaminophen 500 MG tablet Commonly known as:  TYLENOL Take 500 mg by mouth daily as needed for headache.   albuterol 108 (90 Base) MCG/ACT inhaler Commonly known as:  PROVENTIL HFA;VENTOLIN HFA Inhale 2 puffs into the lungs every 4 (four) hours as needed for wheezing or shortness of breath.   amitriptyline 50 MG tablet Commonly known as:  ELAVIL TAKE TWO TABLETS BY MOUTH AT NIGHT   aspirin EC 81 MG tablet Take 81 mg by mouth daily.   buPROPion 150 MG 24 hr tablet Commonly known as:  WELLBUTRIN XL TAKE 1 TABLET BY MOUTH ONCE DAILY   calcium carbonate 500 MG  chewable tablet Commonly known as:  TUMS - dosed in mg elemental calcium Chew 2 tablets by mouth daily as needed for indigestion or heartburn.   clonazePAM 0.5 MG tablet Commonly known as:  KLONOPIN TAKE 1 TABLET BY MOUTH TWICE DAILY AS NEEDED What changed:    how much to take  reasons to take this   Cranberry 400 MG Caps Take 800 mg by mouth daily as needed (uti symptoms).   cyclobenzaprine 10 MG tablet Commonly known as:  FLEXERIL TAKE ONE TABLET BY MOUTH THREE TIMES DAILY AS NEEDED FOR MUSCLE SPASM   diphenhydrAMINE 25 MG tablet Commonly known as:  BENADRYL Take 25 mg by mouth 2 (two) times daily.   EQ FIBER THERAPY 625 MG tablet Generic drug:  polycarbophil Take 625 mg by mouth daily. FIBER THERAPY   FLUoxetine 40 MG capsule Commonly known as:  PROZAC TAKE 1 CAPSULE BY MOUTH TWICE DAILY   guaiFENesin 600 MG 12 hr tablet Commonly known as:  MUCINEX Take 600 mg by mouth 2 (two) times daily as needed (congestion).   lansoprazole 30 MG capsule Commonly known as:  PREVACID Take 1 capsule (30 mg total) by mouth daily at 12 noon.   magnesium oxide 400 MG tablet Commonly known as:  MAG-OX Take 400 mg by mouth daily.   metoprolol tartrate 25 MG tablet Commonly known as:  LOPRESSOR TAKE 1/2 (ONE-HALF) TABLET BY MOUTH TWICE DAILY What changed:  See the new instructions.   multivitamin tablet Take 1 tablet by mouth daily.   naproxen sodium 220 MG  tablet Commonly known as:  ALEVE Take 220 mg by mouth daily.   Oxycodone HCl 10 MG Tabs Take 1 tablet (10 mg total) by mouth every 3 (three) hours as needed for severe pain ((score 7 to 10)).   pantoprazole 40 MG tablet Commonly known as:  PROTONIX Take 40 mg by mouth daily before breakfast.   rosuvastatin 40 MG tablet Commonly known as:  CRESTOR Take 1 tablet (40 mg total) by mouth daily. What changed:  when to take this   Simethicone 125 MG Caps Take 125 mg by mouth daily as needed (gas).   vitamin C 1000 MG  tablet Take 1,000 mg by mouth daily.   Vitamin D 1000 units capsule Take 1,000 Units by mouth daily.        Signed: Clarice Zulauf P 10/27/2018, 8:34 AM

## 2018-10-27 NOTE — Progress Notes (Signed)
Pt doing well. Pt given D/C instructions with Rx, verbal understanding was provided. Pt's incision is clean and dry with no sign of infection. Pt's IV was removed prior to D/C. Pt D/C'd home via wheelchair @ 1400 per MD order. Pt is stable @ D/C and has no other needs at this time. Holli Humbles, RN

## 2018-10-27 NOTE — Evaluation (Signed)
Occupational Therapy Evaluation Patient Details Name: Amanda [name] MRN: [id number] DOB: [birth year] Today's Date: 10/27/2018    History of Present Illness Pt is a 67 y/o female who presents s/p C4-C7 ACDF on 10/26/18. PMH significant for osteoporosis, neuromuscular disorder (effecting back and shoulder), HTN, heart murmur, type 1 diabetes, COPD, aortic stenosis, B TKR, salivary gland removal.   Clinical Impression   Patient evaluated by Occupational Therapy with no further acute OT needs identified. All education has been completed and the patient has no further questions. Pt requires supervision for ADLs.  She is able to verbalize precautions independently.   Sister will be staying with her for several weeks and can assist as needed.  See below for any follow-up Occupational Therapy or equipment needs. OT is signing off. Thank you for this referral.      Follow Up Recommendations  No OT follow up;Supervision/Assistance - 24 hour    Equipment Recommendations  None recommended by OT    Recommendations for Other Services       Precautions / Restrictions Precautions Precautions: Fall;Cervical Precaution Booklet Issued: Yes (comment) Precaution Comments: VC's for maintenance of precautions during functional mobility.  Required Braces or Orthoses: Cervical Brace Cervical Brace: Soft collar Restrictions Weight Bearing Restrictions: No      Mobility Bed Mobility Overal bed mobility: Needs Assistance Bed Mobility: Rolling;Sidelying to Sit Rolling: Supervision Sidelying to sit: Supervision       General bed mobility comments: increased time and effort   Transfers Overall transfer level: Needs assistance Equipment used: None Transfers: Sit to/from Stand Sit to Stand: Supervision         General transfer comment: Hands-on guarding required for power-up to full stand. Increased time required for pt to gain/maintain standing balance.     Balance Overall balance  assessment: Needs assistance Sitting-balance support: No upper extremity supported;Feet supported Sitting balance-Leahy Scale: Fair     Standing balance support: During functional activity Standing balance-Leahy Scale: Fair Standing balance comment: Able to stand statically with no LOB                            ADL either performed or assessed with clinical judgement   ADL Overall ADL's : Needs assistance/impaired Eating/Feeding: Modified independent   Grooming: Wash/dry hands;Wash/dry face;Oral care;Brushing hair;Supervision/safety;Standing Grooming Details (indicate cue type and reason): reviewed use of cup to spit in during oral care (pt has dentures) Upper Body Bathing: Supervision/ safety;Sitting   Lower Body Bathing: Supervison/ safety;Sit to/from stand Lower Body Bathing Details (indicate cue type and reason): Pt able to access feet using figure 4 technique.  Also discussed use of LH bath sponge/brush as an alternative  Upper Body Dressing : Supervision/safety;Sitting   Lower Body Dressing: Supervision/safety;Sit to/from stand Lower Body Dressing Details (indicate cue type and reason): able to access feet using figure 4 technique  Toilet Transfer: Supervision/safety;Ambulation;Comfort height toilet   Toileting- Clothing Manipulation and Hygiene: Supervision/safety   Tub/ Shower Transfer: Tub transfer;Supervision/safety;Ambulation;Shower seat   Functional mobility during ADLs: Supervision/safety General ADL Comments: Pt able to don/doff brace with supervision.   Reviewed no arms overhead, safety with washing hair and no lifting > 1/2 gallon of milk      Vision         Perception     Praxis      Pertinent Vitals/Pain Pain Assessment: Faces Faces Pain Scale: Hurts little more Pain Location: incision site, across chest (pecs), and latissimus area (bilaterally) Pain Descriptors /  Indicators: Operative site guarding;Discomfort Pain Intervention(s):  Monitored during session     Hand Dominance     Extremity/Trunk Assessment Upper Extremity Assessment Upper Extremity Assessment: Overall WFL for tasks assessed   Lower Extremity Assessment Lower Extremity Assessment: Defer to PT evaluation   Cervical / Trunk Assessment Cervical / Trunk Assessment: Other exceptions Cervical / Trunk Exceptions: s/p surgery   Communication Communication Communication: No difficulties   Cognition Arousal/Alertness: Awake/alert Behavior During Therapy: WFL for tasks assessed/performed Overall Cognitive Status: Within Functional Limits for tasks assessed Area of Impairment: Memory;Safety/judgement                     Memory: Decreased short-term memory;Decreased recall of precautions   Safety/Judgement: Decreased awareness of safety     General Comments: Pt was able to recall all precautions and remember info PT provided to her.  No apparent deficits noted during OT eval    General Comments       Exercises     Shoulder Instructions      Home Living Family/patient expects to be discharged to:: Private residence Living Arrangements: Spouse/significant other Available Help at Discharge: Family;Available 24 hours/day Type of Home: House Home Access: Level entry     Home Layout: Two level Alternate Level Stairs-Number of Steps: 3(to/from kitchen) Alternate Level Stairs-Rails: None Bathroom Shower/Tub: Teacher, early years/pre: Standard Bathroom Accessibility: Yes   Home Equipment: Environmental consultant - 2 wheels;Cane - single point;Shower seat;Adaptive equipment Adaptive Equipment: Reacher;Sock aid;Long-handled shoe horn Additional Comments: Pt's sister will be staying with her for several weeks.  Pt's spouse has parkinson's and cardiac issues       Prior Functioning/Environment Level of Independence: Independent        Comments: Has not been using cane or RW. Cooks, cleans, independent with ADL's        OT Problem List:  Decreased knowledge of precautions;Pain      OT Treatment/Interventions:      OT Goals(Current goals can be found in the care plan section) Acute Rehab OT Goals Patient Stated Goal: To get back to normal  OT Goal Formulation: All assessment and education complete, DC therapy  OT Frequency:     Barriers to D/C:            Co-evaluation              AM-PAC PT "6 Clicks" Daily Activity     Outcome Measure Help from another person eating meals?: None Help from another person taking care of personal grooming?: None Help from another person toileting, which includes using toliet, bedpan, or urinal?: None Help from another person bathing (including washing, rinsing, drying)?: None Help from another person to put on and taking off regular upper body clothing?: None Help from another person to put on and taking off regular lower body clothing?: None 6 Click Score: 24   End of Session Equipment Utilized During Treatment: Cervical collar Nurse Communication: Mobility status  Activity Tolerance: Patient tolerated treatment well Patient left: in bed;with call bell/phone within reach  OT Visit Diagnosis: Pain Pain - part of body: (neck )                Time: 5852-7782 OT Time Calculation (min): 16 min Charges:  OT General Charges $OT Visit: 1 Visit OT Evaluation $OT Eval Low Complexity: 1 Low  Lucille Passy, OTR/L Acute Rehabilitation Services Pager [phone] Office [phone]   Lucille Passy M 10/27/2018, 10:26 AM

## 2018-10-28 ENCOUNTER — Encounter (HOSPITAL_COMMUNITY): Payer: Self-pay | Admitting: Neurosurgery

## 2018-10-28 DIAGNOSIS — M170 Bilateral primary osteoarthritis of knee: Secondary | ICD-10-CM | POA: Diagnosis not present

## 2018-10-28 DIAGNOSIS — I10 Essential (primary) hypertension: Secondary | ICD-10-CM | POA: Diagnosis not present

## 2018-10-28 DIAGNOSIS — Z4789 Encounter for other orthopedic aftercare: Secondary | ICD-10-CM | POA: Diagnosis not present

## 2018-10-28 DIAGNOSIS — E119 Type 2 diabetes mellitus without complications: Secondary | ICD-10-CM | POA: Diagnosis not present

## 2018-10-28 DIAGNOSIS — Z981 Arthrodesis status: Secondary | ICD-10-CM | POA: Diagnosis not present

## 2018-10-28 DIAGNOSIS — J449 Chronic obstructive pulmonary disease, unspecified: Secondary | ICD-10-CM | POA: Diagnosis not present

## 2018-10-31 DIAGNOSIS — E119 Type 2 diabetes mellitus without complications: Secondary | ICD-10-CM | POA: Diagnosis not present

## 2018-10-31 DIAGNOSIS — Z4789 Encounter for other orthopedic aftercare: Secondary | ICD-10-CM | POA: Diagnosis not present

## 2018-10-31 DIAGNOSIS — M170 Bilateral primary osteoarthritis of knee: Secondary | ICD-10-CM | POA: Diagnosis not present

## 2018-10-31 DIAGNOSIS — I10 Essential (primary) hypertension: Secondary | ICD-10-CM | POA: Diagnosis not present

## 2018-10-31 DIAGNOSIS — J449 Chronic obstructive pulmonary disease, unspecified: Secondary | ICD-10-CM | POA: Diagnosis not present

## 2018-10-31 DIAGNOSIS — Z981 Arthrodesis status: Secondary | ICD-10-CM | POA: Diagnosis not present

## 2018-11-01 ENCOUNTER — Other Ambulatory Visit: Payer: Self-pay | Admitting: Family Medicine

## 2018-11-01 NOTE — Telephone Encounter (Signed)
Last office visit 07/27/2018 for CP.  Last refilled 10/07/2018 for #30 with no refills.  No future appointments.

## 2018-11-02 DIAGNOSIS — Z981 Arthrodesis status: Secondary | ICD-10-CM | POA: Diagnosis not present

## 2018-11-02 DIAGNOSIS — I10 Essential (primary) hypertension: Secondary | ICD-10-CM | POA: Diagnosis not present

## 2018-11-02 DIAGNOSIS — Z4789 Encounter for other orthopedic aftercare: Secondary | ICD-10-CM | POA: Diagnosis not present

## 2018-11-02 DIAGNOSIS — M170 Bilateral primary osteoarthritis of knee: Secondary | ICD-10-CM | POA: Diagnosis not present

## 2018-11-02 DIAGNOSIS — J449 Chronic obstructive pulmonary disease, unspecified: Secondary | ICD-10-CM | POA: Diagnosis not present

## 2018-11-02 DIAGNOSIS — E119 Type 2 diabetes mellitus without complications: Secondary | ICD-10-CM | POA: Diagnosis not present

## 2018-11-04 DIAGNOSIS — Z981 Arthrodesis status: Secondary | ICD-10-CM | POA: Diagnosis not present

## 2018-11-04 DIAGNOSIS — E119 Type 2 diabetes mellitus without complications: Secondary | ICD-10-CM | POA: Diagnosis not present

## 2018-11-04 DIAGNOSIS — I10 Essential (primary) hypertension: Secondary | ICD-10-CM | POA: Diagnosis not present

## 2018-11-04 DIAGNOSIS — J449 Chronic obstructive pulmonary disease, unspecified: Secondary | ICD-10-CM | POA: Diagnosis not present

## 2018-11-04 DIAGNOSIS — M170 Bilateral primary osteoarthritis of knee: Secondary | ICD-10-CM | POA: Diagnosis not present

## 2018-11-04 DIAGNOSIS — Z4789 Encounter for other orthopedic aftercare: Secondary | ICD-10-CM | POA: Diagnosis not present

## 2018-11-07 ENCOUNTER — Encounter (HOSPITAL_COMMUNITY): Payer: Self-pay | Admitting: Neurosurgery

## 2018-11-07 NOTE — OR Nursing (Signed)
Addendum to implants on 11/07/18 to correct implant record.

## 2018-11-08 DIAGNOSIS — E119 Type 2 diabetes mellitus without complications: Secondary | ICD-10-CM | POA: Diagnosis not present

## 2018-11-08 DIAGNOSIS — J449 Chronic obstructive pulmonary disease, unspecified: Secondary | ICD-10-CM | POA: Diagnosis not present

## 2018-11-08 DIAGNOSIS — M170 Bilateral primary osteoarthritis of knee: Secondary | ICD-10-CM | POA: Diagnosis not present

## 2018-11-08 DIAGNOSIS — Z4789 Encounter for other orthopedic aftercare: Secondary | ICD-10-CM | POA: Diagnosis not present

## 2018-11-08 DIAGNOSIS — I10 Essential (primary) hypertension: Secondary | ICD-10-CM | POA: Diagnosis not present

## 2018-11-08 DIAGNOSIS — Z981 Arthrodesis status: Secondary | ICD-10-CM | POA: Diagnosis not present

## 2018-11-10 DIAGNOSIS — Z981 Arthrodesis status: Secondary | ICD-10-CM | POA: Diagnosis not present

## 2018-11-10 DIAGNOSIS — Z4789 Encounter for other orthopedic aftercare: Secondary | ICD-10-CM | POA: Diagnosis not present

## 2018-11-10 DIAGNOSIS — J449 Chronic obstructive pulmonary disease, unspecified: Secondary | ICD-10-CM | POA: Diagnosis not present

## 2018-11-10 DIAGNOSIS — I10 Essential (primary) hypertension: Secondary | ICD-10-CM | POA: Diagnosis not present

## 2018-11-10 DIAGNOSIS — E119 Type 2 diabetes mellitus without complications: Secondary | ICD-10-CM | POA: Diagnosis not present

## 2018-11-10 DIAGNOSIS — M170 Bilateral primary osteoarthritis of knee: Secondary | ICD-10-CM | POA: Diagnosis not present

## 2018-12-04 ENCOUNTER — Other Ambulatory Visit: Payer: Self-pay | Admitting: Family Medicine

## 2018-12-05 NOTE — Telephone Encounter (Signed)
Last office visit 07/27/2018 for CP.  Last refilled 11/01/2018 for #30 with no refills.  No future appointments.

## 2018-12-13 ENCOUNTER — Encounter: Payer: Self-pay | Admitting: Cardiovascular Disease

## 2018-12-13 ENCOUNTER — Ambulatory Visit (INDEPENDENT_AMBULATORY_CARE_PROVIDER_SITE_OTHER): Payer: Medicare Other | Admitting: Cardiovascular Disease

## 2018-12-13 VITALS — BP 122/60 | HR 82 | Ht 68.0 in | Wt 220.5 lb

## 2018-12-13 DIAGNOSIS — I2510 Atherosclerotic heart disease of native coronary artery without angina pectoris: Secondary | ICD-10-CM

## 2018-12-13 DIAGNOSIS — I350 Nonrheumatic aortic (valve) stenosis: Secondary | ICD-10-CM

## 2018-12-13 DIAGNOSIS — Z720 Tobacco use: Secondary | ICD-10-CM | POA: Diagnosis not present

## 2018-12-13 DIAGNOSIS — E785 Hyperlipidemia, unspecified: Secondary | ICD-10-CM | POA: Diagnosis not present

## 2018-12-13 DIAGNOSIS — R002 Palpitations: Secondary | ICD-10-CM

## 2018-12-13 MED ORDER — METOPROLOL TARTRATE 25 MG PO TABS: ORAL_TABLET | ORAL | 1 refills | Status: DC

## 2018-12-13 NOTE — Patient Instructions (Signed)
Medication Instructions:  No changes If you need a refill on your cardiac medications before your next appointment, please call your pharmacy.   Lab work: Your provider would like for you to return in tomorrow to have the following labs drawn: Fasting lipid and liver. Please go to the [facility] entrance and check in at the front desk. You do not need an appointment.   If you have labs (blood work) drawn today and your tests are completely normal, you will receive your results only by: Marland Kitchen MyChart Message (if you have MyChart) OR . A paper copy in the mail If you have any lab test that is abnormal or we need to change your treatment, we will call you to review the results.  Testing/Procedures: None ordered  Follow-Up: At [facility], you and your health needs are our priority.  As part of our continuing mission to provide you with exceptional heart care, we have created designated Provider Care Teams.  These Care Teams include your primary Cardiologist (physician) and Advanced Practice Providers (APPs -  Physician Assistants and Nurse Practitioners) who all work together to provide you with the care you need, when you need it. You will need a follow up appointment in 6 months.  Please call our office 2 months in advance to schedule this appointment.  You may see Dr. Fletcher Anon or one of the following Advanced Practice Providers on your designated Care Team:   Murray Hodgkins, NP Christell Faith, PA-C . Marrianne Mood, PA-C

## 2018-12-13 NOTE — Progress Notes (Signed)
Cardiology Office Note   Date:  12/13/2018   ID:  Amanda [name], DOB [birth year], MRN [id number]  PCP:  Owens Loffler, MD  Cardiologist:   Kathlyn Sacramento, MD   Chief Complaint  Patient presents with  . OTHER    F/u cardiac CT c/o chest discomfort feels like spasm, sob and inbalance. Meds reviewed verbally with pt.      History of Present Illness: Amanda [name] is a 68 y.o. female who presents for a follow-up visit regarding moderate aortic stenosis and mild to moderate nonobstructive coronary artery disease.   She has other chronic medical conditions that include COPD, tobacco use, diet-controlled diabetes, hypertension and hyperlipidemia. Previous cardiac workup in 2013 included a treadmill stress test  which was normal,  Her mother died last year of metastatic pancreatic cancer.  She had chest pain during that time.  She was evaluated with a CTA of the coronary arteries which showed a calcium score of 521 with moderate nonobstructive disease affecting the RCA and LAD.  These were not significant by FFR. Most recent echo in September showed an EF of 60 to 65% with moderate aortic stenosis.  Mean gradient was 19 mmHg with valve area of 1.28.  She has been doing well with no recent chest pain or worsening dyspnea.  She had cervical spine surgery in November and she is still wearing a brace.  She cut down on tobacco use down to 2 to 3 cigarettes daily.  Past Medical History:  Diagnosis Date  . 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 sting    bee stings  . Anxiety   . Aortic stenosis    a. 2013: nl LV sys fxn, mild MR, no evidence of pulm htn; b. TTE 8/17: EF 60-65%, no RWMA, nl LV dia fxn,  mild AS, mod AI  . COPD (chronic obstructive pulmonary disease) ([place]) 10/30/2016  . Degenerative arthritis of knee, bilateral 06/16/2015  . Depression   . Diabetes ([place])    diet managed-type 11  . GERD (gastroesophageal reflux disease)   . Heart murmur   . Hiatal hernia with gastroesophageal reflux 1997  .  Hyperlipidemia   . Hypertension   . Neuromuscular disorder ([place])    nerve damage in back and shoulder  . Osteoporosis   . Palpitations    a. 03/2012: 48-hour Holter monitor showed intermittent sinus tachycardia with occasional PACs, without other significant arrhythmia  . Thyroid disease    hyperthyroidism as a teenager - received some injections age 36 that corrected this   . Tobacco abuse     Past Surgical History:  Procedure Laterality Date  . ABDOMINAL HYSTERECTOMY  1993  . ANTERIOR CERVICAL DECOMP/DISCECTOMY FUSION N/A 10/26/2018   Procedure: ACDF C4-C5 C5-C6 C6-C7;  Surgeon: Kary Kos, MD;  Location: [facility];  Service: Neurosurgery;  Laterality: N/A;  . APPENDECTOMY    . BACK SURGERY    . BREAST BIOPSY Right 2007  . CHOLECYSTECTOMY    . COLONOSCOPY     30 years ago was normal per pt.   Marland Kitchen DILATION AND CURETTAGE OF UTERUS    . ESOPHAGEAL DILATION     x 3  . fractured leg Right   . OVARIAN CYST REMOVAL  1972  . SALIVARY GLAND SURGERY    . TONSILLECTOMY     4 yoa  . TOTAL KNEE ARTHROPLASTY Left 12/24/2015   Procedure: LEFT TOTAL KNEE ARTHROPLASTY;  Surgeon: [name], MD;  Location: [facility];  Service: Orthopedics;  Laterality: Left;  .  TOTAL KNEE ARTHROPLASTY Right 12/30/2017   Procedure: RIGHT TOTAL KNEE ARTHROPLASTY;  Surgeon: [name], MD;  Location: [facility];  Service: Orthopedics;  Laterality: Right;  . WRIST FRACTURE SURGERY  11/2008     Current Outpatient Medications  Medication Sig Dispense Refill  . acetaminophen (TYLENOL) 500 MG tablet Take 500 mg by mouth daily as needed for headache.    . albuterol (PROVENTIL HFA;VENTOLIN HFA) 108 (90 Base) MCG/ACT inhaler Inhale 2 puffs into the lungs every 4 (four) hours as needed for wheezing or shortness of breath. 1 Inhaler 5  . amitriptyline (ELAVIL) 50 MG tablet TAKE TWO TABLETS BY MOUTH AT NIGHT 180 tablet 3  . Ascorbic Acid (VITAMIN C) 1000 MG tablet Take 1,000 mg by mouth daily.    Marland Kitchen aspirin EC 81 MG  tablet Take 81 mg by mouth daily.    Marland Kitchen buPROPion (WELLBUTRIN XL) 150 MG 24 hr tablet TAKE 1 TABLET BY MOUTH ONCE DAILY 90 tablet 0  . calcium carbonate (TUMS - DOSED IN MG ELEMENTAL CALCIUM) 500 MG chewable tablet Chew 2 tablets by mouth daily as needed for indigestion or heartburn.    . Cholecalciferol (VITAMIN D) 1000 UNITS capsule Take 1,000 Units by mouth daily.     . clonazePAM (KLONOPIN) 0.5 MG tablet TAKE 1 TABLET BY MOUTH TWICE DAILY AS NEEDED 30 tablet 3  . Cranberry 400 MG CAPS Take 800 mg by mouth daily as needed (uti symptoms).    . cyclobenzaprine (FLEXERIL) 10 MG tablet TAKE ONE TABLET BY MOUTH THREE TIMES DAILY AS NEEDED FOR MUSCLE SPASM 50 tablet 4  . diphenhydrAMINE (BENADRYL) 25 MG tablet Take 25 mg by mouth 2 (two) times daily.    Marland Kitchen FLUoxetine (PROZAC) 40 MG capsule TAKE 1 CAPSULE BY MOUTH TWICE DAILY 180 capsule 0  . guaiFENesin (MUCINEX) 600 MG 12 hr tablet Take 600 mg by mouth 2 (two) times daily as needed (congestion).    . lansoprazole (PREVACID) 30 MG capsule Take 1 capsule (30 mg total) by mouth daily at 12 noon. 90 capsule 3  . magnesium oxide (MAG-OX) 400 MG tablet Take 400 mg by mouth daily.    . metoprolol tartrate (LOPRESSOR) 25 MG tablet TAKE 1/2 (ONE-HALF) TABLET BY MOUTH TWICE DAILY (Patient taking differently: Take 12.5 mg by mouth 2 (two) times daily. ) 90 tablet 0  . Multiple Vitamin (MULTIVITAMIN) tablet Take 1 tablet by mouth daily.    . naproxen sodium (ALEVE) 220 MG tablet Take 220 mg by mouth daily.    Marland Kitchen oxyCODONE 10 MG TABS Take 1 tablet (10 mg total) by mouth every 3 (three) hours as needed for severe pain ((score 7 to 10)). 40 tablet 0  . pantoprazole (PROTONIX) 40 MG tablet Take 40 mg by mouth daily before breakfast.     . polycarbophil (EQ FIBER THERAPY) 625 MG tablet Take 625 mg by mouth daily. FIBER THERAPY    . rosuvastatin (CRESTOR) 40 MG tablet Take 1 tablet (40 mg total) by mouth daily. (Patient taking differently: Take 40 mg by mouth at bedtime.  ) 90 tablet 1  . Simethicone 125 MG CAPS Take 125 mg by mouth daily as needed (gas).     No current facility-administered medications for this visit.     Allergies:   Diazepam    Social History:  The patient  reports that she has been smoking cigarettes. She has a 33.75 pack-year smoking history. She has never used smokeless tobacco. She reports current alcohol use. She reports  that she does not use drugs.   Family History:  The patient's family history includes Arthritis in her mother; Breast cancer in her sister; Cancer in her paternal grandmother; Colon polyps in her mother and sister; Diabetes in her mother; Heart disease in her mother; Hypercholesterolemia in her sister; Hyperlipidemia in her mother.    ROS:  Please see the history of present illness.   Otherwise, review of systems are positive for none.   All other systems are reviewed and negative.    PHYSICAL EXAM: VS:  BP 122/60 (BP Location: Left Arm, Patient Position: Sitting, Cuff Size: Normal)   Pulse 82   Ht 5\' 8"  (1.727 m)   Wt 220 lb 8 oz (100 kg)   BMI 33.53 kg/m  , BMI Body mass index is 33.53 kg/m. GEN: Well nourished, well developed, in no acute distress  HEENT: normal  Neck: no JVD, carotid bruits, or masses Cardiac: RRR; no  rubs, or gallops,no edema . 2/6 crescendo decrescendo systolic murmur in the aortic area which is  mid peaking. Respiratory:  clear to auscultation bilaterally, normal work of breathing GI: soft, nontender, nondistended, + BS MS: no deformity or atrophy  Skin: warm and dry, no rash Neuro:  Strength and sensation are intact Psych: euthymic mood, full affect   EKG:  EKG is not ordered today. Recent EKG was reviewed which showed sinus rhythm with  right bundle branch block.   Recent Labs: 10/18/2018: BUN 10; Creatinine, Ser 0.76; Hemoglobin 13.1; Platelets 203; Potassium 4.3; Sodium 137    Lipid Panel    Component Value Date/Time   CHOL 178 06/02/2017 1327   CHOL 201 (H)  09/18/2013 1016   TRIG 81.0 06/02/2017 1327   HDL 53.20 06/02/2017 1327   HDL 53 09/18/2013 1016   CHOLHDL 3 06/02/2017 1327   VLDL 16.2 06/02/2017 1327   LDLCALC 108 (H) 06/02/2017 1327   LDLCALC 130 (H) 09/18/2013 1016   LDLDIRECT 209.2 05/28/2009 0909      Wt Readings from Last 3 Encounters:  12/13/18 220 lb 8 oz (100 kg)  10/26/18 220 lb (99.8 kg)  10/18/18 220 lb 4.8 oz (99.9 kg)         ASSESSMENT AND PLAN:  1.  Moderate nonobstructive coronary artery disease: No anginal symptoms.  Continue medical therapy and aggressive treatment of risk factors.  2.  Aortic stenosis: This was moderate on most recent echocardiogram in September.  Repeat echo in September 2020.  3.  Palpitations: Previous mild PACs. Symptoms are well-controlled on small dose metoprolol.  4 . Tobacco use: I again discussed with her the importance of smoking cessation.  She cut down on tobacco use significantly.  5.  Hyperlipidemia: Previous LDL was 108 on atorvastatin and since then she was switched to high-dose rosuvastatin.  Requested follow-up lipid and liver profile.   Disposition:   FU with me in 6 months  Signed,  Kathlyn Sacramento, MD  12/13/2018 4:43 PM    [place]

## 2018-12-15 ENCOUNTER — Other Ambulatory Visit
Admission: RE | Admit: 2018-12-15 | Discharge: 2018-12-15 | Disposition: A | Discharge status: Home / Self Care | Payer: Medicare Other | Source: Ambulatory Visit | Attending: Cardiovascular Disease | Admitting: Cardiovascular Disease

## 2018-12-15 DIAGNOSIS — M542 Cervicalgia: Secondary | ICD-10-CM | POA: Diagnosis not present

## 2018-12-15 DIAGNOSIS — E785 Hyperlipidemia, unspecified: Secondary | ICD-10-CM | POA: Diagnosis not present

## 2018-12-15 LAB — HEPATIC FUNCTION PANEL
ALT: 22 U/L (ref 0–44)
AST: 26 U/L (ref 15–41)
Albumin: 4.2 g/dL (ref 3.5–5.0)
Alkaline Phosphatase: 48 U/L (ref 38–126)
Bilirubin, Direct: <0.1 mg/dL (ref 0.0–0.2)
Total Bilirubin: 0.6 mg/dL (ref 0.3–1.2)
Total Protein: 6.7 g/dL (ref 6.5–8.1)

## 2018-12-15 LAB — LIPID PANEL
Cholesterol: 148 mg/dL (ref 0–200)
HDL: 64 mg/dL (ref >40)
LDL Cholesterol: 65 mg/dL (ref 0–99)
Total CHOL/HDL Ratio: 2.3 RATIO
Triglycerides: 97 mg/dL (ref <150)
VLDL: 19 mg/dL (ref 0–40)

## 2019-01-06 ENCOUNTER — Telehealth: Payer: Self-pay

## 2019-01-06 NOTE — Telephone Encounter (Signed)
I spoke to the pharmacist myself.  This patient just had spine surgery.  She is still in postoperative care, and I am sure that neurosurgery gave her some pain pills.  Would you please call her, and ask her to not take any of her cyclobenzaprine right now while she is taking her pain medication just from the sedation risk.  There is some potential increased risk of sedation and falls when taking these together along with some of her other medication.  Long-term when she is off pain medicine, she can likely go back on this without any significant problem.

## 2019-01-06 NOTE — Telephone Encounter (Signed)
I will handle this

## 2019-01-06 NOTE — Telephone Encounter (Signed)
Tried calling Mrs. Geraghty but voicemail is full and can not accept any messages at this time.  Left message on Mr. Mccaughey voicemail to return call to office in regards to Mrs. Stucker's medications.

## 2019-01-06 NOTE — Telephone Encounter (Signed)
Mrs. Jeanmarie notified as instructed by telephone.  Patient states understanding

## 2019-01-06 NOTE — Telephone Encounter (Signed)
Pharmacist calls to make [place] aware of medication combination concerns for patient and to request a change in therapy.  Patient has the following r/x's which in combo present a significantly dangerous level of sedation and overdose potential:  1.  Cyclobenzaprine - prescribed by [facility] 2. Clonazepam - prescribed by [facility] 3.  "now getting a strong opioid" no name given (looks like oxycodone from the records) - prescribed by a Dr. Glenford Peers at [facility] in [place].    Pharmacist is requesting that we consider changing the cyclobenzaprine to a lesser sedating medication that will work better with this strong combination.    I will forward to Dr. Diona Browner in Dr. Lillie Fragmin absence today but will copy him on this as well.  Provider please advise on acceptable replacement.  Thanks.

## 2019-01-08 ENCOUNTER — Other Ambulatory Visit: Payer: Self-pay | Admitting: Family Medicine

## 2019-02-02 ENCOUNTER — Other Ambulatory Visit: Payer: Self-pay | Admitting: Family Medicine

## 2019-02-23 ENCOUNTER — Telehealth: Payer: Self-pay | Admitting: Family Medicine

## 2019-02-23 NOTE — Telephone Encounter (Signed)
Pt called to office and want to know if she need to sched an AWV now or do she need to wait. Please advise

## 2019-02-24 NOTE — Telephone Encounter (Signed)
I would wait until at least the end of june

## 2019-02-27 NOTE — Telephone Encounter (Signed)
Left message asking pt to call office  °

## 2019-02-28 ENCOUNTER — Other Ambulatory Visit: Payer: Self-pay | Admitting: Family Medicine

## 2019-03-26 ENCOUNTER — Other Ambulatory Visit (INDEPENDENT_AMBULATORY_CARE_PROVIDER_SITE_OTHER): Payer: Self-pay | Admitting: Specialist

## 2019-03-26 DIAGNOSIS — R519 Headache, unspecified: Secondary | ICD-10-CM

## 2019-03-26 DIAGNOSIS — R51 Headache: Principal | ICD-10-CM

## 2019-03-27 NOTE — Telephone Encounter (Signed)
Amitriptyline refill request

## 2019-03-31 ENCOUNTER — Other Ambulatory Visit: Payer: Self-pay | Admitting: Family Medicine

## 2019-03-31 MED ORDER — CYCLOBENZAPRINE HCL 10 MG PO TABS: 10.0000 mg | ORAL_TABLET | Freq: Three times a day (TID) | ORAL | 4 refills | Status: DC | PRN

## 2019-03-31 NOTE — Telephone Encounter (Signed)
Last office visit 07/27/2018 for multiple issues.  Last refilled 08/02/2018 for #50 with 4 refills.  CPE scheduled for 06/05/2019.

## 2019-03-31 NOTE — Telephone Encounter (Signed)
Best number [phone]  Pt called to get a refill cyclobenzaprine 10mg  tablets.  Pt stated she is not taking oxycodone anymore this was for her surgery Pt has 2 pills left  [facility] [address]

## 2019-04-09 ENCOUNTER — Other Ambulatory Visit: Payer: Self-pay | Admitting: Family Medicine

## 2019-04-09 ENCOUNTER — Other Ambulatory Visit: Payer: Self-pay | Admitting: Cardiovascular Disease

## 2019-04-10 NOTE — Telephone Encounter (Signed)
Last office visit 07/27/2018 for CP.  Last refilled 12/05/2018 for #30 with 3 refills.  CPE scheduled 06/05/2019.

## 2019-04-11 ENCOUNTER — Other Ambulatory Visit: Payer: Self-pay | Admitting: Family Medicine

## 2019-05-08 ENCOUNTER — Other Ambulatory Visit: Payer: Self-pay | Admitting: Family Medicine

## 2019-05-16 ENCOUNTER — Telehealth: Payer: Self-pay | Admitting: Family Medicine

## 2019-05-16 NOTE — Telephone Encounter (Signed)
Patient stated that she has stopped taking the Prozac 2x daily. For about 2 weeks she has only taken 1x daily.  She stated that she had been foggy headed with the 2 and has felt much better since only taking one.    Patient Phone- [phone]

## 2019-05-17 NOTE — Telephone Encounter (Signed)
This is totally fine, and I am glad she feels better

## 2019-05-17 NOTE — Telephone Encounter (Addendum)
Mrs. Wenner notified as instructed by telephone.  Medication list updated.

## 2019-05-30 ENCOUNTER — Other Ambulatory Visit: Payer: Medicare Other

## 2019-05-30 ENCOUNTER — Ambulatory Visit (INDEPENDENT_AMBULATORY_CARE_PROVIDER_SITE_OTHER): Payer: Medicare Other

## 2019-05-30 DIAGNOSIS — E119 Type 2 diabetes mellitus without complications: Secondary | ICD-10-CM | POA: Diagnosis not present

## 2019-05-30 DIAGNOSIS — I10 Essential (primary) hypertension: Secondary | ICD-10-CM | POA: Diagnosis not present

## 2019-05-30 DIAGNOSIS — Z0000 Encounter for general adult medical examination without abnormal findings: Secondary | ICD-10-CM | POA: Diagnosis not present

## 2019-05-30 NOTE — Progress Notes (Signed)
Subjective:   Amanda [name] is a 68 y.o. female who presents for Medicare Annual (Subsequent) preventive examination.  Review of Systems:  N/A Cardiac Risk Factors include: advanced age (>79men, >21 women);diabetes mellitus;dyslipidemia;hypertension;obesity (BMI >30kg/m2);smoking/ tobacco exposure     Objective:     Vitals: There were no vitals taken for this visit.  There is no height or weight on file to calculate BMI.  Advanced Directives 05/30/2019 10/18/2018 01/21/2018 12/20/2017 06/02/2017 09/14/2016 02/18/2016  Does Patient Have a Medical Advance Directive? Yes Yes Yes Yes Yes Yes Yes  Type of Paramedic of [place];Living will Living will [place];Living will [place];Living will [place];Living will Living will Living will  Does patient want to make changes to medical advance directive? No - Patient declined No - Patient declined - - - - -  Copy of [place] in Chart? No - copy requested - Yes Yes No - copy requested - No - copy requested    Tobacco Social History   Tobacco Use  Smoking Status Current Every Day Smoker  . Packs/day: 0.75  . Years: 45.00  . Pack years: 33.75  . Types: Cigarettes  Smokeless Tobacco Never Used  Tobacco Comment   trying to quit before knee surgery     Ready to quit: No Counseling given: No Comment: trying to quit before knee surgery   Clinical Intake:  Pre-visit preparation completed: Yes  Pain : No/denies pain Pain Score: 0-No pain     Nutritional Status: BMI > 30  Obese Nutritional Risks: None Diabetes: Yes CBG done?: No Did pt. bring in CBG monitor from home?: No  How often do you need to have someone help you when you read instructions, pamphlets, or other written materials from your doctor or pharmacy?: 1 - Never What is the last grade level you completed in school?: 12th grade + 1 yr college  Interpreter Needed?: No   Comments: pt lives with spouse Information entered by :: LPinson, RN  Past Medical History:  Diagnosis Date  . 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 sting    bee stings  . Anxiety   . Aortic stenosis    a. 2013: nl LV sys fxn, mild MR, no evidence of pulm htn; b. TTE 8/17: EF 60-65%, no RWMA, nl LV dia fxn,  mild AS, mod AI  . COPD (chronic obstructive pulmonary disease) ([place]) 10/30/2016  . Degenerative arthritis of knee, bilateral 06/16/2015  . Depression   . Diabetes ([place])    diet managed-type 11  . GERD (gastroesophageal reflux disease)   . Heart murmur   . Hiatal hernia with gastroesophageal reflux 1997  . Hyperlipidemia   . Hypertension   . 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 Osteoporosis   . Palpitations    a. 03/2012: 48-hour Holter monitor showed intermittent sinus tachycardia with occasional PACs, without other significant arrhythmia  . Thyroid disease    hyperthyroidism as a teenager - received some injections age 6 that corrected this   . Tobacco abuse    Past Surgical History:  Procedure Laterality Date  . ABDOMINAL HYSTERECTOMY  1993  . ANTERIOR CERVICAL DECOMP/DISCECTOMY FUSION N/A 10/26/2018   Procedure: ACDF C4-C5 C5-C6 C6-C7;  Surgeon: Kary Kos, MD;  Location: [facility];  Service: Neurosurgery;  Laterality: N/A;  . APPENDECTOMY    . BACK SURGERY    . BREAST BIOPSY Right 2007  . CHOLECYSTECTOMY    .  COLONOSCOPY     30 years ago was normal per pt.   Marland Kitchen DILATION AND CURETTAGE OF UTERUS    . ESOPHAGEAL DILATION     x 3  . fractured leg Right   . OVARIAN CYST REMOVAL  1972  . SALIVARY GLAND SURGERY    . TONSILLECTOMY     4 yoa  . TOTAL KNEE ARTHROPLASTY Left 12/24/2015   Procedure: LEFT TOTAL KNEE ARTHROPLASTY;  Surgeon: [name], MD;  Location: [facility];  Service: Orthopedics;  Laterality: Left;  . TOTAL KNEE ARTHROPLASTY Right 12/30/2017   Procedure: RIGHT TOTAL KNEE ARTHROPLASTY;  Surgeon: [name], MD;  Location: [facility];   Service: Orthopedics;  Laterality: Right;  . WRIST FRACTURE SURGERY  11/2008   Family History  Problem Relation Age of Onset  . Hyperlipidemia Mother   . Arthritis Mother   . Diabetes Mother   . Heart disease Mother   . Colon polyps Mother   . Cancer Paternal Grandmother        breast  . Hypercholesterolemia Sister   . Colon polyps Sister   . Breast cancer Sister   . Colon cancer Neg Hx   . Esophageal cancer Neg Hx   . Rectal cancer Neg Hx   . Stomach cancer Neg Hx   . Pancreatic cancer Neg Hx    Social History   Socioeconomic History  . Marital status: Married    Spouse name: Not on file  . Number of children: 2  . Years of education: Not on file  . Highest education level: Not on file  Occupational History  . Occupation: Chief Executive Officer: [employer]  Social Needs  . Financial resource strain: Not on file  . Food insecurity    Worry: Not on file    Inability: Not on file  . Transportation needs    Medical: Not on file    Non-medical: Not on file  Tobacco Use  . Smoking status: Current Every Day Smoker    Packs/day: 0.75    Years: 45.00    Pack years: 33.75    Types: Cigarettes  . Smokeless tobacco: Never Used  . Tobacco comment: trying to quit before knee surgery  Substance and Sexual Activity  . Alcohol use: Yes    Alcohol/week: 0.0 standard drinks    Comment: glass of wine twice a month   . Drug use: No  . Sexual activity: Not Currently  Lifestyle  . Physical activity    Days per week: Not on file    Minutes per session: Not on file  . Stress: Not on file  Relationships  . Social Herbalist on phone: Not on file    Gets together: Not on file    Attends religious service: Not on file    Active member of club or organization: Not on file    Attends meetings of clubs or organizations: Not on file    Relationship status: Not on file  Other Topics Concern  . Not on file  Social History Narrative   . Not on file    Outpatient Encounter Medications as of 05/30/2019  Medication Sig  . acetaminophen (TYLENOL) 500 MG tablet Take 500 mg by mouth daily as needed for headache.  . albuterol (PROVENTIL HFA;VENTOLIN HFA) 108 (90 Base) MCG/ACT inhaler Inhale 2 puffs into the lungs every 4 (four) hours as needed for wheezing or shortness of breath.  Marland Kitchen amitriptyline (ELAVIL) 50  MG tablet TAKE 2 TABLETS BY MOUTH ONCE DAILY AT NIGHT  . Ascorbic Acid (VITAMIN C) 1000 MG tablet Take 1,000 mg by mouth daily.  Marland Kitchen aspirin EC 81 MG tablet Take 81 mg by mouth daily.  Marland Kitchen buPROPion (WELLBUTRIN XL) 150 MG 24 hr tablet Take 1 tablet by mouth once daily  . calcium carbonate (TUMS - DOSED IN MG ELEMENTAL CALCIUM) 500 MG chewable tablet Chew 2 tablets by mouth daily as needed for indigestion or heartburn.  . Cholecalciferol (VITAMIN D) 1000 UNITS capsule Take 1,000 Units by mouth daily.   . clonazePAM (KLONOPIN) 0.5 MG tablet Take 1 tablet by mouth twice daily as needed  . Cranberry 400 MG CAPS Take 800 mg by mouth daily as needed (uti symptoms).  . cyclobenzaprine (FLEXERIL) 10 MG tablet Take 1 tablet (10 mg total) by mouth 3 (three) times daily as needed. for muscle spams  . diphenhydrAMINE (BENADRYL) 25 MG tablet Take 25 mg by mouth 2 (two) times daily.  Marland Kitchen FLUoxetine (PROZAC) 40 MG capsule Take 40 mg by mouth daily.  Marland Kitchen guaiFENesin (MUCINEX) 600 MG 12 hr tablet Take 600 mg by mouth 2 (two) times daily as needed (congestion).  . lansoprazole (PREVACID) 30 MG capsule Take 1 capsule (30 mg total) by mouth daily at 12 noon.  . magnesium oxide (MAG-OX) 400 MG tablet Take 400 mg by mouth daily.  . metoprolol tartrate (LOPRESSOR) 25 MG tablet TAKE 1/2 (ONE-HALF) TABLET BY MOUTH TWICE DAILY  . Multiple Vitamin (MULTIVITAMIN) tablet Take 1 tablet by mouth daily.  . naproxen sodium (ALEVE) 220 MG tablet Take 220 mg by mouth daily.  . pantoprazole (PROTONIX) 40 MG tablet TAKE ONE TABLET BY MOUTH 30 MINUTES PRIOR TO BREAKFAST   . polycarbophil (EQ FIBER THERAPY) 625 MG tablet Take 625 mg by mouth daily. FIBER THERAPY  . rosuvastatin (CRESTOR) 40 MG tablet Take 1 tablet by mouth once daily  . Simethicone 125 MG CAPS Take 125 mg by mouth daily as needed (gas).   No facility-administered encounter medications on file as of 05/30/2019.     Activities of Daily Living In your present state of health, do you have any difficulty performing the following activities: 05/30/2019 10/18/2018  Hearing? N N  Vision? Y N  Difficulty concentrating or making decisions? N N  Walking or climbing stairs? N Y  Dressing or bathing? N N  Doing errands, shopping? N N  Preparing Food and eating ? N -  Using the Toilet? N -  In the past six months, have you accidently leaked urine? N -  Do you have problems with loss of bowel control? N -  Managing your Medications? N -  Managing your Finances? N -  Housekeeping or managing your Housekeeping? N -  Some recent data might be hidden    Patient Care Team: Owens Loffler, MD as PCP - General (Family Medicine)    Assessment:   This is a routine wellness examination for Amanda [name].   Hearing Screening   125Hz  250Hz  500Hz  1000Hz  2000Hz  3000Hz  4000Hz  6000Hz  8000Hz   Right ear:           Left ear:           Vision Screening Comments: Vision exam several years ago   Exercise Activities and Dietary recommendations Current Exercise Habits: The patient does not participate in regular exercise at present, Exercise limited by: None identified  Goals    . Patient Stated     Starting 05/30/19, I will continue to  take medications as prescribed.        Fall Risk Fall Risk  05/30/2019 06/02/2017 04/09/2017 09/04/2016  Falls in the past year? 0 Yes Yes Yes  Number falls in past yr: - 1 2 or more -  Comment - pt reports foot slipped while walking on snow; no medical treatment - -  Injury with Fall? - Yes No No   Depression Screen PHQ 2/9 Scores 05/30/2019 06/02/2017  PHQ - 2 Score 0 0  PHQ- 9  Score 0 -     Cognitive Function MMSE - Mini Mental State Exam 05/30/2019 06/02/2017 10/25/2015  Orientation to time 5 5 5   Orientation to Place 5 5 5   Registration 3 3 3   Attention/ Calculation 0 0 5  Recall 3 3 3   Language- name 2 objects 0 0 2  Language- repeat 1 1 1   Language- follow 3 step command 0 3 3  Language- read & follow direction 0 0 1  Write a sentence 0 0 1  Copy design 0 0 1  Total score 17 20 30      PLEASE NOTE: A Mini-Cog screen was completed. Maximum score is 17. A value of 0 denotes this part of [place]stein MMSE was not completed or the patient failed this part of the Mini-Cog screening.   Mini-Cog Screening Orientation to Time - Max 5 pts Orientation to Place - Max 5 pts Registration - Max 3 pts Recall - Max 3 pts Language Repeat - Max 1 pts      Immunization History  Administered Date(s) Administered  . Influenza Split 09/09/2011, 10/10/2012  . Influenza Whole 09/20/2006  . Influenza,inj,Quad PF,6+ Mos 09/18/2015, 09/16/2016, 09/13/2017  . PPD Test 12/27/2015  . Pneumococcal Conjugate-13 09/18/2015  . Pneumococcal Polysaccharide-23 11/23/2016  . Td 12/26/1998, 09/13/2017    Screening Tests Health Maintenance  Topic Date Due  . URINE MICROALBUMIN  06/02/2018  . HEMOGLOBIN A1C  04/18/2019  . FOOT EXAM  06/05/2019 (Originally 12/19/2011)  . MAMMOGRAM  12/07/2019 (Originally 02/21/2017)  . OPHTHALMOLOGY EXAM  12/07/2019 (Originally 05/18/1960)  . DEXA SCAN  12/07/2019 (Originally 05/19/2015)  . INFLUENZA VACCINE  07/08/2019  . COLONOSCOPY  11/19/2025  . TETANUS/TDAP  09/14/2027  . Hepatitis C Screening  Completed  . PNA vac Low Risk Adult  Completed       Plan:     I have personally reviewed, addressed, and noted the following in the patient's chart:  A. Medical and social history B. Use of alcohol, tobacco or illicit drugs  C. Current medications and supplements D. Functional ability and status E.  Nutritional status F.  Physical activity  G. Advance directives H. List of other physicians I.  Hospitalizations, surgeries, and ER visits in previous 12 months J.  Vitals (unless it is a telemedicine encounter) K. Screenings to include cognitive, depression, hearing, vision (NOTE: hearing and vision screenings not completed in telemedicine encounter) L. Referrals and appointments   In addition, I have reviewed and discussed with patient certain preventive protocols, quality metrics, and best practice recommendations. A written personalized care plan for preventive services and recommendations were provided to patient.  With patient's permission, we connected on 05/30/19 at 11:30 AM EDT. Interactive audio and video telecommunications were attempted with patient. This attempt was unsuccessful due to patient having technical difficulties OR patient did not have access to video capability.  Encounter was completed with audio only.  Two patient identifiers were used to ensure the encounter occurred with the correct person. Patient was in  home and writer was in office.     Signed,   Lindell Noe, MHA, BS, RN Health Coach

## 2019-05-30 NOTE — Progress Notes (Signed)
I reviewed health advisor's note, was available for consultation, and agree with documentation and plan.   Signed,  Nisha Dhami T. Awais Cobarrubias, MD  

## 2019-05-30 NOTE — Patient Instructions (Signed)
Ms. Syfert , Thank you for taking time to come for your Medicare Wellness Visit. I appreciate your ongoing commitment to your health goals. Please review the following plan we discussed and let me know if I can assist you in the future.   These are the goals we discussed: Goals    . Patient Stated     Starting 05/30/19, I will continue to take medications as prescribed.        This is a list of the screening recommended for you and due dates:  Health Maintenance  Topic Date Due  . Urine Protein Check  06/02/2018  . Hemoglobin A1C  04/18/2019  . Complete foot exam   06/05/2019*  . Mammogram  12/07/2019*  . Eye exam for diabetics  12/07/2019*  . DEXA scan (bone density measurement)  12/07/2019*  . Flu Shot  07/08/2019  . Colon Cancer Screening  11/19/2025  . Tetanus Vaccine  09/14/2027  .  Hepatitis C: One time screening is recommended by Center for Disease Control  (CDC) for  adults born from [birth year] through 1965.   Completed  . Pneumonia vaccines  Completed  *Topic was postponed. The date shown is not the original due date.   Preventive Care for Adults  A healthy lifestyle and preventive care can promote health and wellness. Preventive health guidelines for adults include the following key practices.  . A routine yearly physical is a good way to check with your health care provider about your health and preventive screening. It is a chance to share any concerns and updates on your health and to receive a thorough exam.  . Visit your dentist for a routine exam and preventive care every 6 months. Brush your teeth twice a day and floss once a day. Good oral hygiene prevents tooth decay and gum disease.  . The frequency of eye exams is based on your age, health, family medical history, use  of contact lenses, and other factors. Follow your health care provider's recommendations for frequency of eye exams.  . Eat a healthy diet. Foods like vegetables, fruits, whole grains, low-fat dairy  products, and lean protein foods contain the nutrients you need without too many calories. Decrease your intake of foods high in solid fats, added sugars, and salt. Eat the right amount of calories for you. Get information about a proper diet from your health care provider, if necessary.  . Regular physical exercise is one of the most important things you can do for your health. Most adults should get at least 150 minutes of moderate-intensity exercise (any activity that increases your heart rate and causes you to sweat) each week. In addition, most adults need muscle-strengthening exercises on 2 or more days a week.  Silver Sneakers may be a benefit available to you. To determine eligibility, you may visit the website: www.silversneakers.com or contact program at (407)184-7517 Mon-Fri between 8AM-8PM.   . Maintain a healthy weight. The body mass index (BMI) is a screening tool to identify possible weight problems. It provides an estimate of body fat based on height and weight. Your health care provider can find your BMI and can help you achieve or maintain a healthy weight.   For adults 20 years and older: ? A BMI below 18.5 is considered underweight. ? A BMI of 18.5 to 24.9 is normal. ? A BMI of 25 to 29.9 is considered overweight. ? A BMI of 30 and above is considered obese.   . Maintain normal blood lipids and cholesterol  levels by exercising and minimizing your intake of saturated fat. Eat a balanced diet with plenty of fruit and vegetables. Blood tests for lipids and cholesterol should begin at age 73 and be repeated every 5 years. If your lipid or cholesterol levels are high, you are over 50, or you are at high risk for heart disease, you may need your cholesterol levels checked more frequently. Ongoing high lipid and cholesterol levels should be treated with medicines if diet and exercise are not working.  . If you smoke, find out from your health care provider how to quit. If you do not  use tobacco, please do not start.  . If you choose to drink alcohol, please do not consume more than 2 drinks per day. One drink is considered to be 12 ounces (355 mL) of beer, 5 ounces (148 mL) of wine, or 1.5 ounces (44 mL) of liquor.  . If you are 67-75 years old, ask your health care provider if you should take aspirin to prevent strokes.  . Use sunscreen. Apply sunscreen liberally and repeatedly throughout the day. You should seek shade when your shadow is shorter than you. Protect yourself by wearing long sleeves, pants, a wide-brimmed hat, and sunglasses year round, whenever you are outdoors.  . Once a month, do a whole body skin exam, using a mirror to look at the skin on your back. Tell your health care provider of new moles, moles that have irregular borders, moles that are larger than a pencil eraser, or moles that have changed in shape or color.

## 2019-05-30 NOTE — Progress Notes (Signed)
PCP notes:   Health maintenance:  Microalbumin - ordered A1C - ordered Foot exam - to be determined Mammogram - to be determined Eye exam - to be determined Bone density - to be determined  Abnormal screenings:   None  Patient concerns:   None  Nurse concerns:  None  Next PCP appt:   06/05/19 @ 1420

## 2019-05-31 ENCOUNTER — Other Ambulatory Visit (INDEPENDENT_AMBULATORY_CARE_PROVIDER_SITE_OTHER): Payer: Medicare Other

## 2019-05-31 DIAGNOSIS — I10 Essential (primary) hypertension: Secondary | ICD-10-CM

## 2019-05-31 DIAGNOSIS — E119 Type 2 diabetes mellitus without complications: Secondary | ICD-10-CM

## 2019-05-31 LAB — HEMOGLOBIN A1C: Hgb A1c MFr Bld: 6.5 % (ref 4.6–6.5)

## 2019-05-31 LAB — CBC WITH DIFFERENTIAL/PLATELET
Basophils Absolute: 0 10*3/uL (ref 0.0–0.1)
Basophils Relative: 0.9 % (ref 0.0–3.0)
Eosinophils Absolute: 0.2 10*3/uL (ref 0.0–0.7)
Eosinophils Relative: 3.3 % (ref 0.0–5.0)
HCT: 38.1 % (ref 36.0–46.0)
Hemoglobin: 12.7 g/dL (ref 12.0–15.0)
Lymphocytes Relative: 21.8 % (ref 12.0–46.0)
Lymphs Abs: 1.1 10*3/uL (ref 0.7–4.0)
MCHC: 33.2 g/dL (ref 30.0–36.0)
MCV: 102.3 fl — ABNORMAL HIGH (ref 78.0–100.0)
Monocytes Absolute: 0.3 10*3/uL (ref 0.1–1.0)
Monocytes Relative: 6.7 % (ref 3.0–12.0)
Neutro Abs: 3.4 10*3/uL (ref 1.4–7.7)
Neutrophils Relative %: 67.3 % (ref 43.0–77.0)
Platelets: 177 10*3/uL (ref 150.0–400.0)
RBC: 3.73 Mil/uL — ABNORMAL LOW (ref 3.87–5.11)
RDW: 11.8 % (ref 11.5–15.5)
WBC: 5.1 10*3/uL (ref 4.0–10.5)

## 2019-05-31 LAB — BASIC METABOLIC PANEL
BUN: 9 mg/dL (ref 6–23)
CO2: 29 mEq/L (ref 19–32)
Calcium: 8.8 mg/dL (ref 8.4–10.5)
Chloride: 105 mEq/L (ref 96–112)
Creatinine, Ser: 0.83 mg/dL (ref 0.40–1.20)
GFR: 68.15 mL/min (ref >60.00)
Glucose, Bld: 165 mg/dL — ABNORMAL HIGH (ref 70–99)
Potassium: 4 mEq/L (ref 3.5–5.1)
Sodium: 139 mEq/L (ref 135–145)

## 2019-05-31 LAB — MICROALBUMIN / CREATININE URINE RATIO
Creatinine,U: 51.8 mg/dL
Microalb Creat Ratio: 1.4 mg/g (ref 0.0–30.0)
Microalb, Ur: <0.7 mg/dL (ref 0.0–1.9)

## 2019-05-31 NOTE — Progress Notes (Signed)
I reviewed health advisor's note, was available for consultation on the day of service listed in this note, and agree with documentation and plan. Early Ord, MD.   

## 2019-06-05 ENCOUNTER — Encounter: Payer: Self-pay | Admitting: Family Medicine

## 2019-06-05 ENCOUNTER — Ambulatory Visit (INDEPENDENT_AMBULATORY_CARE_PROVIDER_SITE_OTHER): Payer: Medicare Other | Admitting: Family Medicine

## 2019-06-05 VITALS — BP 130/75 | HR 82 | Wt 230.0 lb

## 2019-06-05 DIAGNOSIS — J42 Unspecified chronic bronchitis: Secondary | ICD-10-CM

## 2019-06-05 DIAGNOSIS — E7800 Pure hypercholesterolemia, unspecified: Secondary | ICD-10-CM | POA: Diagnosis not present

## 2019-06-05 DIAGNOSIS — I10 Essential (primary) hypertension: Secondary | ICD-10-CM | POA: Diagnosis not present

## 2019-06-05 DIAGNOSIS — E1121 Type 2 diabetes mellitus with diabetic nephropathy: Secondary | ICD-10-CM

## 2019-06-05 DIAGNOSIS — F3341 Major depressive disorder, recurrent, in partial remission: Secondary | ICD-10-CM | POA: Diagnosis not present

## 2019-06-05 DIAGNOSIS — I2510 Atherosclerotic heart disease of native coronary artery without angina pectoris: Secondary | ICD-10-CM | POA: Diagnosis not present

## 2019-06-05 NOTE — Progress Notes (Signed)
Amanda [name] T. Amanda Rosendahl, MD Primary [facility] and [facility] at [facility] [place], [address] Phone: [phone]  FAX: [place] - 68 y.o. female  MRN [id number]  Date of Birth: 19-Aug-1951  Visit Date: 06/05/2019  PCP: Owens Loffler, MD  Referred by: Owens Loffler, MD  Virtual Visit via Telephone Note:  I connected with  Amanda [name] on 06/05/2019  2:20 PM EDT by telephone and verified that I am speaking with the correct person using two identifiers.   Location patient: home phone or cell phone Location provider: work or home office Consent: Verbal consent directly obtained from Amanda [name] and that there may be a patient responsible charge related to this service. Persons participating in the virtual visit: patient, provider  I discussed the limitations of evaluation and management by telemedicine and the availability of in person appointments.  The patient expressed understanding and agreed to proceed.     History of Present Illness:  Has gained weight  prozac to 1 a day mammo  Diabetes Mellitus: Tolerating Medications: yes Compliance with diet: fair, Body mass index is 34.97 kg/m. Exercise: minimal / intermittent Avg blood sugars at home: not checking Foot problems: none Hypoglycemia: none No nausea, vomitting, blurred vision, polyuria.  Lab Results  Component Value Date   HGBA1C 6.5 05/31/2019   HGBA1C 5.9 (H) 10/18/2018   HGBA1C 5.8 (H) 12/20/2017   Lab Results  Component Value Date   MICROALBUR <0.7 05/31/2019   LDLCALC 65 12/15/2018   CREATININE 0.83 05/31/2019    Wt Readings from Last 3 Encounters:  06/05/19 230 lb (104.3 kg)  12/13/18 220 lb 8 oz (100 kg)  10/26/18 220 lb (99.8 kg)    Lipids: Doing well, stable. Tolerating meds fine with no SE. Panel reviewed with patient.  Lipids:    Component Value Date/Time   CHOL 148 12/15/2018 1302   CHOL 201 (H)  09/18/2013 1016   TRIG 97 12/15/2018 1302   HDL 64 12/15/2018 1302   HDL 53 09/18/2013 1016   LDLDIRECT 209.2 05/28/2009 0909   VLDL 19 12/15/2018 1302   CHOLHDL 2.3 12/15/2018 1302    Lab Results  Component Value Date   ALT 22 12/15/2018   AST 26 12/15/2018   ALKPHOS 48 12/15/2018   BILITOT 0.6 12/15/2018    HTN: Tolerating all medications without side effects Stable and at goal No CP, no sob. No HA.  BP Readings from Last 3 Encounters:  06/05/19 130/75  12/13/18 122/60  10/27/18 (!) 916/38    Basic Metabolic Panel:    Component Value Date/Time   NA 139 05/31/2019 0813   K 4.0 05/31/2019 0813   CL 105 05/31/2019 0813   CO2 29 05/31/2019 0813   BUN 9 05/31/2019 0813   CREATININE 0.83 05/31/2019 0813   GLUCOSE 165 (H) 05/31/2019 0813   CALCIUM 8.8 05/31/2019 0813    Immunization History  Administered Date(s) Administered  . Influenza Split 09/09/2011, 10/10/2012  . Influenza Whole 09/20/2006  . Influenza,inj,Quad PF,6+ Mos 09/18/2015, 09/16/2016, 09/13/2017  . PPD Test 12/27/2015  . Pneumococcal Conjugate-13 09/18/2015  . Pneumococcal Polysaccharide-23 11/23/2016  . Td 12/26/1998, 09/13/2017    Health Maintenance  Topic Date Due  . FOOT EXAM  06/05/2019 (Originally 12/19/2011)  . MAMMOGRAM  12/07/2019 (Originally 02/21/2017)  . OPHTHALMOLOGY EXAM  12/07/2019 (Originally 05/18/1960)  . DEXA SCAN  12/07/2019 (Originally 05/19/2015)  . INFLUENZA VACCINE  07/08/2019  . HEMOGLOBIN  A1C  11/30/2019  . URINE MICROALBUMIN  05/30/2020  . COLONOSCOPY  11/19/2025  . TETANUS/TDAP  09/14/2027  . Hepatitis C Screening  Completed  . PNA vac Low Risk Adult  Completed     Review of Systems: pertinent positives and pertinent negatives as per HPI No acute distress verbally  Past Medical History, Surgical History, Social History, Family History, Problem List, Medications, and Allergies have been reviewed and updated if relevant.   Observations/Objective/Exam:  An attempt  was made to discern vital signs over the phone and per patient if applicable and possible.   Pulmonary:     Effort: Pulmonary effort is normal. No respiratory distress.  Neurological:     Mental Status: He is alert and oriented to person, place, and time.  Psychiatric:        Thought Content: Thought content normal.        Judgment: Judgment normal.   Assessment and Plan:    ICD-10-CM   1. Controlled type 2 diabetes mellitus with diabetic nephropathy, without long-term current use of insulin (HCC)  E11.21   2. Essential hypertension  I10   3. Pure hypercholesterolemia  E78.00   4. Chronic bronchitis, unspecified chronic bronchitis type ([place])  J42   5. Major depressive disorder, recurrent episode, in partial remission (HCC)  F33.41    >15 minutes spent in face to face time with patient, >50% spent in counselling or coordination of care overall doing well and completely stable  rec mammo and stop smoking  I discussed the assessment and treatment plan with the patient. The patient was provided an opportunity to ask questions and all were answered. The patient agreed with the plan and demonstrated an understanding of the instructions.   The patient was advised to call back or seek an in-person evaluation if the symptoms worsen or if the condition fails to improve as anticipated.  Follow-up: prn unless noted otherwise below No follow-ups on file.  No orders of the defined types were placed in this encounter.  No orders of the defined types were placed in this encounter.   Signed,  Maud Deed. Jataya Wann, MD

## 2019-06-14 ENCOUNTER — Other Ambulatory Visit: Payer: Self-pay | Admitting: Cardiovascular Disease

## 2019-06-14 ENCOUNTER — Other Ambulatory Visit: Payer: Self-pay | Admitting: Family Medicine

## 2019-06-14 NOTE — Telephone Encounter (Signed)
Last office visit 06/05/2019 for CPE.  Last refilled 04/10/2019 for #30 with 1 refill.  No future appointments.

## 2019-06-28 ENCOUNTER — Other Ambulatory Visit (INDEPENDENT_AMBULATORY_CARE_PROVIDER_SITE_OTHER): Payer: Self-pay | Admitting: Specialist

## 2019-06-28 DIAGNOSIS — R519 Headache, unspecified: Secondary | ICD-10-CM

## 2019-06-28 NOTE — Telephone Encounter (Signed)
Amitriptyline refill request

## 2019-07-10 ENCOUNTER — Other Ambulatory Visit: Payer: Self-pay | Admitting: Cardiovascular Disease

## 2019-07-10 ENCOUNTER — Other Ambulatory Visit: Payer: Self-pay

## 2019-07-10 ENCOUNTER — Other Ambulatory Visit: Payer: Self-pay | Admitting: Family Medicine

## 2019-07-20 ENCOUNTER — Other Ambulatory Visit: Payer: Self-pay | Admitting: Family Medicine

## 2019-07-20 DIAGNOSIS — Z1231 Encounter for screening mammogram for malignant neoplasm of breast: Secondary | ICD-10-CM

## 2019-07-27 ENCOUNTER — Other Ambulatory Visit: Payer: Self-pay | Admitting: Neurosurgery

## 2019-07-27 DIAGNOSIS — M542 Cervicalgia: Secondary | ICD-10-CM | POA: Diagnosis not present

## 2019-07-27 DIAGNOSIS — M5440 Lumbago with sciatica, unspecified side: Secondary | ICD-10-CM | POA: Diagnosis not present

## 2019-07-27 DIAGNOSIS — G959 Disease of spinal cord, unspecified: Secondary | ICD-10-CM | POA: Diagnosis not present

## 2019-07-31 DIAGNOSIS — H2513 Age-related nuclear cataract, bilateral: Secondary | ICD-10-CM | POA: Diagnosis not present

## 2019-07-31 DIAGNOSIS — H5203 Hypermetropia, bilateral: Secondary | ICD-10-CM | POA: Diagnosis not present

## 2019-07-31 LAB — HM DIABETES EYE EXAM

## 2019-08-01 ENCOUNTER — Other Ambulatory Visit: Payer: Self-pay | Admitting: Neurosurgery

## 2019-08-01 DIAGNOSIS — M5440 Lumbago with sciatica, unspecified side: Secondary | ICD-10-CM

## 2019-08-11 ENCOUNTER — Ambulatory Visit
Admission: RE | Admit: 2019-08-11 | Discharge: 2019-08-11 | Disposition: A | Discharge status: Home / Self Care | Payer: Medicare Other | Source: Ambulatory Visit | Attending: Neurosurgery | Admitting: Neurosurgery

## 2019-08-11 ENCOUNTER — Other Ambulatory Visit: Payer: Self-pay

## 2019-08-11 DIAGNOSIS — M5127 Other intervertebral disc displacement, lumbosacral region: Secondary | ICD-10-CM | POA: Diagnosis not present

## 2019-08-11 DIAGNOSIS — M542 Cervicalgia: Secondary | ICD-10-CM

## 2019-08-11 DIAGNOSIS — M5440 Lumbago with sciatica, unspecified side: Secondary | ICD-10-CM | POA: Diagnosis not present

## 2019-08-11 DIAGNOSIS — M5137 Other intervertebral disc degeneration, lumbosacral region: Secondary | ICD-10-CM | POA: Diagnosis not present

## 2019-08-11 DIAGNOSIS — M47816 Spondylosis without myelopathy or radiculopathy, lumbar region: Secondary | ICD-10-CM | POA: Diagnosis not present

## 2019-08-11 DIAGNOSIS — M4807 Spinal stenosis, lumbosacral region: Secondary | ICD-10-CM | POA: Diagnosis not present

## 2019-08-15 DIAGNOSIS — M48061 Spinal stenosis, lumbar region without neurogenic claudication: Secondary | ICD-10-CM | POA: Diagnosis not present

## 2019-08-15 DIAGNOSIS — M542 Cervicalgia: Secondary | ICD-10-CM | POA: Diagnosis not present

## 2019-08-15 DIAGNOSIS — Z23 Encounter for immunization: Secondary | ICD-10-CM | POA: Diagnosis not present

## 2019-08-22 ENCOUNTER — Ambulatory Visit
Admission: RE | Admit: 2019-08-22 | Discharge: 2019-08-22 | Disposition: A | Discharge status: Home / Self Care | Payer: Medicare Other | Source: Ambulatory Visit | Attending: Family Medicine | Admitting: Family Medicine

## 2019-08-22 ENCOUNTER — Other Ambulatory Visit: Payer: Self-pay | Admitting: Family Medicine

## 2019-08-22 DIAGNOSIS — Z1231 Encounter for screening mammogram for malignant neoplasm of breast: Secondary | ICD-10-CM | POA: Insufficient documentation

## 2019-08-23 ENCOUNTER — Other Ambulatory Visit: Payer: Self-pay

## 2019-08-23 ENCOUNTER — Ambulatory Visit (INDEPENDENT_AMBULATORY_CARE_PROVIDER_SITE_OTHER): Payer: Medicare Other

## 2019-08-23 DIAGNOSIS — I350 Nonrheumatic aortic (valve) stenosis: Secondary | ICD-10-CM | POA: Diagnosis not present

## 2019-08-24 ENCOUNTER — Telehealth: Payer: Self-pay

## 2019-08-24 ENCOUNTER — Other Ambulatory Visit: Payer: Self-pay | Admitting: Family Medicine

## 2019-08-24 DIAGNOSIS — R928 Other abnormal and inconclusive findings on diagnostic imaging of breast: Secondary | ICD-10-CM

## 2019-08-24 DIAGNOSIS — R921 Mammographic calcification found on diagnostic imaging of breast: Secondary | ICD-10-CM

## 2019-08-24 NOTE — Telephone Encounter (Signed)
-----   Message from Wellington Hampshire, MD sent at 08/24/2019  3:48 PM EDT ----- Inform patient that echo was fine.  Normal EF with stable moderate aortic stenosis.

## 2019-08-24 NOTE — Telephone Encounter (Signed)
Patient made aware of echo results with verbalized understanding. 

## 2019-09-01 ENCOUNTER — Ambulatory Visit
Admission: RE | Admit: 2019-09-01 | Discharge: 2019-09-01 | Disposition: A | Discharge status: Home / Self Care | Payer: Medicare Other | Source: Ambulatory Visit | Attending: Family Medicine | Admitting: Family Medicine

## 2019-09-01 DIAGNOSIS — R921 Mammographic calcification found on diagnostic imaging of breast: Secondary | ICD-10-CM | POA: Diagnosis not present

## 2019-09-01 DIAGNOSIS — R928 Other abnormal and inconclusive findings on diagnostic imaging of breast: Secondary | ICD-10-CM | POA: Insufficient documentation

## 2019-09-01 DIAGNOSIS — R920 Mammographic microcalcification found on diagnostic imaging of breast: Secondary | ICD-10-CM | POA: Diagnosis not present

## 2019-09-04 DIAGNOSIS — Z6834 Body mass index (BMI) 34.0-34.9, adult: Secondary | ICD-10-CM | POA: Diagnosis not present

## 2019-09-04 DIAGNOSIS — R030 Elevated blood-pressure reading, without diagnosis of hypertension: Secondary | ICD-10-CM | POA: Diagnosis not present

## 2019-09-04 DIAGNOSIS — M48062 Spinal stenosis, lumbar region with neurogenic claudication: Secondary | ICD-10-CM | POA: Diagnosis not present

## 2019-09-06 ENCOUNTER — Other Ambulatory Visit: Payer: Self-pay

## 2019-09-06 ENCOUNTER — Ambulatory Visit (INDEPENDENT_AMBULATORY_CARE_PROVIDER_SITE_OTHER): Payer: Medicare Other | Admitting: Family Medicine

## 2019-09-06 ENCOUNTER — Encounter: Payer: Self-pay | Admitting: Family Medicine

## 2019-09-06 VITALS — BP 130/62 | HR 90 | Temp 98.7°F | Ht 66.0 in | Wt 227.8 lb

## 2019-09-06 DIAGNOSIS — M659 Synovitis and tenosynovitis, unspecified: Secondary | ICD-10-CM | POA: Diagnosis not present

## 2019-09-06 DIAGNOSIS — I2510 Atherosclerotic heart disease of native coronary artery without angina pectoris: Secondary | ICD-10-CM

## 2019-09-06 DIAGNOSIS — M65332 Trigger finger, left middle finger: Secondary | ICD-10-CM | POA: Diagnosis not present

## 2019-09-06 MED ORDER — METHYLPREDNISOLONE ACETATE 40 MG/ML IJ SUSP
20.0000 mg | Freq: Once | INTRAMUSCULAR | Status: AC
  Administered 2019-09-06: 20 mg via INTRA_ARTICULAR

## 2019-09-06 NOTE — Progress Notes (Signed)
Amanda [name] T. Helder [name], MD Primary Care and [place] at [facility] [place], [address] Phone: [phone]   FAX: [place] - 68 y.o. female   MRN [id number]   Date of Birth: 09-16-1951  Visit Date: 09/06/2019   PCP: Owens Loffler, MD   Referred by: Owens Loffler, MD  Chief Complaint  Patient presents with   Hand Pain    Left   Trigger finger    Left Middle Finger   Subjective:   Amanda [name] is a 68 y.o. very pleasant female patient with Body mass index is 36.76 kg/m. who presents with the following:  L middle finger trigger finger.  She presents with some hand pain and pain primarily on the left middle finger.  She is having some difficulty making a composite fist.  There is no injury or history of trauma.  She does have some baseline arthritis.  Her finger is getting stuck first thing in the morning and it is tender to palpation on the volar aspect.  inj tenosyn  Past Medical History, Surgical History, Social History, Family History, Problem List, Medications, and Allergies have been reviewed and updated if relevant.  Patient Active Problem List   Diagnosis Date Noted   Myelopathy ([name]) 10/26/2018   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 sting    COPD (chronic obstructive pulmonary disease) ([name]) 10/30/2016   Gastroesophageal reflux disease 09/17/2016   Memory loss 10/29/2015   Primary osteoarthritis of right knee 06/16/2015   Smoker 05/10/2015   Solitary pulmonary nodule 03/28/2013   Hypertension    UNSPECIFIED VITAMIN D DEFICIENCY 05/29/2009   Pure hypercholesterolemia 12/07/2007   Major depressive disorder, recurrent episode, in partial remission ([name]) 12/07/2007   Diabetes mellitus type 2, controlled ([name]) 08/10/2007    Past Medical History:  Diagnosis Date   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 sting    bee stings   Anxiety    Aortic stenosis    a. 2013: nl LV sys fxn, mild MR, no evidence of pulm  htn; b. TTE 8/17: EF 60-65%, no RWMA, nl LV dia fxn,  mild AS, mod AI   COPD (chronic obstructive pulmonary disease) ([name]) 10/30/2016   Degenerative arthritis of knee, bilateral 06/16/2015   Depression    Diabetes ([facility])    diet managed-type 11   GERD (gastroesophageal reflux disease)    Heart murmur    Hiatal hernia with gastroesophageal reflux 1997   Hyperlipidemia    Hypertension    Neuromuscular disorder ([name])    nerve damage in back and shoulder   Osteoporosis    Palpitations    a. 03/2012: 48-hour Holter monitor showed intermittent sinus tachycardia with occasional PACs, without other significant arrhythmia   Thyroid disease    hyperthyroidism as a teenager - received some injections age 31 that corrected this    Tobacco abuse     Past Surgical History:  Procedure Laterality Date   [place] DECOMP/DISCECTOMY FUSION N/A 10/26/2018   Procedure: ACDF C4-C5 C5-C6 C6-C7;  Surgeon: Kary Kos, MD;  Location: [name];  Service: Neurosurgery;  Laterality: N/A;   APPENDECTOMY     BACK SURGERY     BREAST BIOPSY Right 2007   benign   CHOLECYSTECTOMY     COLONOSCOPY     30 years ago was normal per pt.    DILATION AND CURETTAGE OF UTERUS     ESOPHAGEAL DILATION     x 3  fractured leg Right    OOPHORECTOMY     OVARIAN CYST REMOVAL  1972   SALIVARY GLAND SURGERY     TONSILLECTOMY     4 yoa   TOTAL KNEE ARTHROPLASTY Left 12/24/2015   Procedure: LEFT TOTAL KNEE ARTHROPLASTY;  Surgeon: [name], MD;  Location: [place];  Service: Orthopedics;  Laterality: Left;   TOTAL KNEE ARTHROPLASTY Right 12/30/2017   Procedure: RIGHT TOTAL KNEE ARTHROPLASTY;  Surgeon: [name], MD;  Location: [name];  Service: Orthopedics;  Laterality: Right;   WRIST FRACTURE SURGERY  11/2008    Social History   Socioeconomic History   Marital status: Married    Spouse name: Not on file   Number of children: 2   Years  of education: Not on file   Highest education level: Not on file  Occupational History   Occupation: [employer]    Employer: [employer] resource strain: Not on file   Food insecurity    Worry: Not on file    Inability: Not on file   Transportation needs    Medical: Not on file    Non-medical: Not on file  Tobacco Use   Smoking status: Current Every Day Smoker    Packs/day: 0.75    Years: 45.00    Pack years: 33.75    Types: Cigarettes   Smokeless tobacco: Never Used   Tobacco comment: trying to quit before knee surgery  Substance and Sexual Activity   Alcohol use: Yes    Alcohol/week: 0.0 standard drinks    Comment: glass of wine twice a month    Drug use: No   Sexual activity: Not Currently  Lifestyle   Physical activity    Days per week: Not on file    Minutes per session: Not on file   Stress: Not on file  Relationships   Social connections    Talks on phone: Not on file    Gets together: Not on file    Attends religious service: Not on file    Active member of club or organization: Not on file    Attends meetings of clubs or organizations: Not on file    Relationship status: Not on file   Intimate partner violence    Fear of current or ex partner: Not on file    Emotionally abused: Not on file    Physically abused: Not on file    Forced sexual activity: Not on file  Other Topics Concern   Not on file  Social History Narrative   Not on file    Family History  Problem Relation Age of Onset   Hyperlipidemia Mother    Arthritis Mother    Diabetes Mother    Heart disease Mother    Colon polyps Mother    Cancer Paternal Grandmother        breast   Hypercholesterolemia Sister    Colon polyps Sister    Breast cancer Sister    Colon cancer Neg Hx    Esophageal cancer Neg Hx    Rectal cancer Neg Hx    Stomach cancer Neg Hx    Pancreatic cancer Neg Hx      Allergies  Allergen Reactions   Diazepam Other (See Comments)    REACTION: pt states she gets very angry and abusive verbally on this medication     Medication list reviewed and updated in full in [place].  GEN: No fevers,  chills. Nontoxic. Primarily MSK c/o today. MSK: Detailed in the HPI GI: tolerating PO intake without difficulty Neuro: No numbness, parasthesias, or tingling associated. Otherwise the pertinent positives of the ROS are noted above.   Objective:   BP 130/62    Pulse 90    Temp 98.7 F (37.1 C) (Oral)    Ht 5\' 6"  (1.676 m)    Wt 227 lb 12 oz (103.3 kg)    SpO2 98%    BMI 36.76 kg/m    GEN: WDWN, NAD, Non-toxic, Alert & Oriented x 3 HEENT: Atraumatic, Normocephalic.  Ears and Nose: No external deformity. EXTR: No clubbing/cyanosis/edema NEURO: Normal gait.  PSYCH: Normally interactive. Conversant. Not depressed or anxious appearing.  Calm demeanor.    On the third digit on the left side, the patient has an obvious triggering.  She also has tenderness along the volar aspect of the entirety of that digit.  The rest of the hand and wrist exam is completely benign.  Radiology:  Assessment and Plan:     ICD-10-CM   1. Trigger finger, left middle finger  M65.332 methylPREDNISolone acetate (DEPO-MEDROL) injection 20 mg  2. Tenosynovitis of finger  M65.9    Trigger finger with associated tenosynovitis  We discussed the pathophysiology of trigger fingers. Discussed the inflammatory nature of nodule creation and likely nodule abutting the A1 pulley system, this causing the patient's discomfort and sensations. We discussed that treatments for this include direct injection into the tendon sheath to attempt to shrink catching tissue. This can be done 1-2 times. Other treatments include surgical release. If the patient fails to trigger finger injections, I would recommend trigger finger release if the patient desires relief of the symptoms.   Tendon Sheath  Injection Procedure Note GIOVANNI KOKE February 09, 1951 Date of procedure: 09/06/2019  Procedure: Tendon Sheath Injection for Trigger Finger, L Indications: Pain  Procedure Details Verbal consent was obtained. Risks (including rare risk of infection, potential risk for skin lightening and potential atrophy), benefits and alternatives were discussed. Prepped with Chloraprep and Ethyl Chloride used for anesthesia. Under sterile conditions, patient injected at palmar crease aiming distally with 45 degree angle towards nodule; injected directly into tendon sheath. Medication flowed freely without resistance.  Needle size: 22 gauge 1 1/2 inch Injection: 1/2 cc of Lidocaine 1% and Depo-Medrol 20 mg Medication: Depo-Medrol 20 mg  Follow-up: No follow-ups on file.  Meds ordered this encounter  Medications   methylPREDNISolone acetate (DEPO-MEDROL) injection 20 mg   No orders of the defined types were placed in this encounter.   Signed,  Maud Deed. Reghan Thul, MD   Outpatient Encounter Medications as of 09/06/2019  Medication Sig   acetaminophen (TYLENOL) 500 MG tablet Take 500 mg by mouth daily as needed for headache.   albuterol (PROVENTIL HFA;VENTOLIN HFA) 108 (90 Base) MCG/ACT inhaler Inhale 2 puffs into the lungs every 4 (four) hours as needed for wheezing or shortness of breath.   amitriptyline (ELAVIL) 50 MG tablet TAKE 2 TABLETS BY MOUTH ONCE DAILY AT NIGHT   Ascorbic Acid (VITAMIN C) 1000 MG tablet Take 1,000 mg by mouth daily.   aspirin EC 81 MG tablet Take 81 mg by mouth daily.   buPROPion (WELLBUTRIN XL) 150 MG 24 hr tablet Take 1 tablet by mouth once daily   calcium carbonate (TUMS - DOSED IN MG ELEMENTAL CALCIUM) 500 MG chewable tablet Chew 2 tablets by mouth daily as needed for indigestion or heartburn.   Cholecalciferol (VITAMIN D) 1000 UNITS capsule Take 1,000  Units by mouth daily.    clonazePAM (KLONOPIN) 0.5 MG tablet Take 1 tablet by mouth twice daily as needed    cyclobenzaprine (FLEXERIL) 10 MG tablet Take 1 tablet (10 mg total) by mouth 3 (three) times daily as needed. for muscle spams   diphenhydrAMINE (BENADRYL) 25 MG tablet Take 25 mg by mouth 2 (two) times daily.   FLUoxetine (PROZAC) 40 MG capsule Take 40 mg by mouth daily.   guaiFENesin (MUCINEX) 600 MG 12 hr tablet Take 600 mg by mouth 2 (two) times daily as needed (congestion).   lansoprazole (PREVACID) 30 MG capsule Take 1 capsule (30 mg total) by mouth daily at 12 noon.   magnesium oxide (MAG-OX) 400 MG tablet Take 400 mg by mouth daily.   metoprolol tartrate (LOPRESSOR) 25 MG tablet Take 1/2 (one-half) tablet by mouth twice daily   Multiple Vitamin (MULTIVITAMIN) tablet Take 1 tablet by mouth daily.   naproxen sodium (ALEVE) 220 MG tablet Take 220 mg by mouth daily.   pantoprazole (PROTONIX) 40 MG tablet TAKE ONE TABLET BY MOUTH 30 MINUTES PRIOR TO BREAKFAST   polycarbophil (EQ FIBER THERAPY) 625 MG tablet Take 625 mg by mouth daily. FIBER THERAPY   rosuvastatin (CRESTOR) 40 MG tablet Take 1 tablet by mouth once daily   Simethicone 125 MG CAPS Take 125 mg by mouth daily as needed (gas).   [DISCONTINUED] Cranberry 400 MG CAPS Take 800 mg by mouth daily as needed (uti symptoms).   [EXPIRED] methylPREDNISolone acetate (DEPO-MEDROL) injection 20 mg    No facility-administered encounter medications on file as of 09/06/2019.

## 2019-09-11 ENCOUNTER — Other Ambulatory Visit: Payer: Self-pay | Admitting: Cardiovascular Disease

## 2019-09-11 NOTE — Telephone Encounter (Signed)
Please schedule overdue F/U with Dr. Arida. Thank you! 

## 2019-09-12 DIAGNOSIS — M48062 Spinal stenosis, lumbar region with neurogenic claudication: Secondary | ICD-10-CM | POA: Diagnosis not present

## 2019-09-20 NOTE — Telephone Encounter (Signed)
lmov to schedule  °

## 2019-09-20 NOTE — Telephone Encounter (Signed)
Scheduled

## 2019-09-25 ENCOUNTER — Other Ambulatory Visit (INDEPENDENT_AMBULATORY_CARE_PROVIDER_SITE_OTHER): Payer: Self-pay | Admitting: Specialist

## 2019-09-25 DIAGNOSIS — R519 Headache, unspecified: Secondary | ICD-10-CM

## 2019-09-25 NOTE — Telephone Encounter (Signed)
Can you have Dr. Marlou Sa advise, she has never seen Dr. Louanne Skye?  Looks like she was getting from a Dr. Edilia Bo in the past.

## 2019-09-25 NOTE — Telephone Encounter (Signed)
Not sure what she's receiving the Amitriptyline for exactly, and it looks like Dr. Edilia Bo is her PCP, she should see him for management of the medication considering it looks like he's been prescribing in the past

## 2019-09-25 NOTE — Telephone Encounter (Signed)
pls advise. Thanks.  

## 2019-10-02 ENCOUNTER — Other Ambulatory Visit (INDEPENDENT_AMBULATORY_CARE_PROVIDER_SITE_OTHER): Payer: Self-pay | Admitting: Specialist

## 2019-10-02 DIAGNOSIS — R519 Headache, unspecified: Secondary | ICD-10-CM

## 2019-10-05 ENCOUNTER — Other Ambulatory Visit: Payer: Self-pay | Admitting: Family Medicine

## 2019-10-05 DIAGNOSIS — R519 Headache, unspecified: Secondary | ICD-10-CM

## 2019-10-05 MED ORDER — CLONAZEPAM 0.5 MG PO TABS: 0.5000 mg | ORAL_TABLET | Freq: Two times a day (BID) | ORAL | 5 refills | Status: DC | PRN

## 2019-10-05 MED ORDER — AMITRIPTYLINE HCL 50 MG PO TABS: ORAL_TABLET | ORAL | 0 refills | Status: DC

## 2019-10-05 NOTE — Telephone Encounter (Signed)
Last office visit 09/06/2019 for trigger finger. Amitriptyline last refilled 06/28/2019 for #180 with no refills by Dr. Louanne Skye.  Clonazepam 06/14/2019 for #30  With 2 refills.  No future appointments with PCP.  Ok to refill?

## 2019-10-05 NOTE — Telephone Encounter (Signed)
Patient called requesting a refill on Clonazepam and Amitriptyline.  I let patient know pharmacy sent refill request for Amiltriptyline to Dr.Nitka, but patient hasn't seen him since July.  Patient uses [facility]  Patient has been out of both medications since Saturday.

## 2019-10-10 ENCOUNTER — Other Ambulatory Visit: Payer: Self-pay | Admitting: Family Medicine

## 2019-10-17 ENCOUNTER — Other Ambulatory Visit: Payer: Self-pay

## 2019-10-17 ENCOUNTER — Encounter: Payer: Self-pay | Admitting: Cardiovascular Disease

## 2019-10-17 ENCOUNTER — Ambulatory Visit (INDEPENDENT_AMBULATORY_CARE_PROVIDER_SITE_OTHER): Payer: Medicare Other | Admitting: Cardiovascular Disease

## 2019-10-17 VITALS — BP 120/70 | HR 80 | Temp 97.0°F | Ht 67.0 in | Wt 227.5 lb

## 2019-10-17 DIAGNOSIS — I2510 Atherosclerotic heart disease of native coronary artery without angina pectoris: Secondary | ICD-10-CM

## 2019-10-17 DIAGNOSIS — I10 Essential (primary) hypertension: Secondary | ICD-10-CM | POA: Diagnosis not present

## 2019-10-17 DIAGNOSIS — E785 Hyperlipidemia, unspecified: Secondary | ICD-10-CM

## 2019-10-17 DIAGNOSIS — Z720 Tobacco use: Secondary | ICD-10-CM

## 2019-10-17 DIAGNOSIS — I350 Nonrheumatic aortic (valve) stenosis: Secondary | ICD-10-CM

## 2019-10-17 NOTE — Patient Instructions (Signed)
Medication Instructions:  Your physician recommends that you continue on your current medications as directed. Please refer to the Current Medication list given to you today.  *If you need a refill on your cardiac medications before your next appointment, please call your pharmacy*  Lab Work: None ordered If you have labs (blood work) drawn today and your tests are completely normal, you will receive your results only by: Marland Kitchen MyChart Message (if you have MyChart) OR . A paper copy in the mail If you have any lab test that is abnormal or we need to change your treatment, we will call you to review the results.  Testing/Procedures: Your physician has requested that you have an echocardiogram. Echocardiography is a painless test that uses sound waves to create images of your heart. It provides your doctor with information about the size and shape of your heart and how well your heart's chambers and valves are working. This procedure takes approximately one hour. There are no restrictions for this procedure. (To be scheduled in September 2021)  Follow-Up: At [facility], you and your health needs are our priority.  As part of our continuing mission to provide you with exceptional heart care, we have created designated Provider Care Teams.  These Care Teams include your primary Cardiologist (physician) and Advanced Practice Providers (APPs -  Physician Assistants and Nurse Practitioners) who all work together to provide you with the care you need, when you need it.  Your next appointment:   12 months  The format for your next appointment:   In Person  Provider:    You may see Dr. Fletcher Anon or one of the following Advanced Practice Providers on your designated Care Team:    Murray Hodgkins, NP  Christell Faith, PA-C  Marrianne Mood, PA-C   Other Instructions N/A

## 2019-10-17 NOTE — Progress Notes (Signed)
Cardiology Office Note   Date:  10/17/2019   ID:  Amanda [name], DOB [birth year], MRN [id number]  PCP:  Owens Loffler, MD  Cardiologist:   Kathlyn Sacramento, MD   Chief Complaint  Patient presents with  . other    6 month follow up. Meds reviewed by the pt. verbally. Pt. c/o mid-sternum discomfort and shortness of breath.       History of Present Illness: Amanda [name] is a 68 y.o. female who presents for a follow-up visit regarding moderate aortic stenosis and mild to moderate nonobstructive coronary artery disease.   She has other chronic medical conditions that include COPD, tobacco use, diet-controlled diabetes, hypertension and hyperlipidemia. Her mother died last year of metastatic pancreatic cancer.   CTA of the coronary arteries in September 2019 showed a calcium score of 521 with moderate nonobstructive disease affecting the RCA and LAD.  These were not significant by FFR. Most recent echo in September showed an EF of 60 to 65% with moderate aortic stenosis.  Mean gradient was 19 mmHg with valve area of 1.28.  She has been doing reasonably well with rare episodes of chest pain, stable exertional dyspnea and no significant palpitations.  Past Medical History:  Diagnosis Date  . 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 sting    bee stings  . Anxiety   . Aortic stenosis    a. 2013: nl LV sys fxn, mild MR, no evidence of pulm htn; b. TTE 8/17: EF 60-65%, no RWMA, nl LV dia fxn,  mild AS, mod AI  . COPD (chronic obstructive pulmonary disease) ([place]) 10/30/2016  . Degenerative arthritis of knee, bilateral 06/16/2015  . Depression   . Diabetes ([place])    diet managed-type 11  . GERD (gastroesophageal reflux disease)   . Heart murmur   . Hiatal hernia with gastroesophageal reflux 1997  . Hyperlipidemia   . Hypertension   . Neuromuscular disorder ([place])    nerve damage in back and shoulder  . Osteoporosis   . Palpitations    a. 03/2012: 48-hour Holter monitor showed intermittent sinus  tachycardia with occasional PACs, without other significant arrhythmia  . Thyroid disease    hyperthyroidism as a teenager - received some injections age 71 that corrected this   . Tobacco abuse     Past Surgical History:  Procedure Laterality Date  . ABDOMINAL HYSTERECTOMY  1993  . ANTERIOR CERVICAL DECOMP/DISCECTOMY FUSION N/A 10/26/2018   Procedure: ACDF C4-C5 C5-C6 C6-C7;  Surgeon: Kary Kos, MD;  Location: [facility];  Service: Neurosurgery;  Laterality: N/A;  . APPENDECTOMY    . BACK SURGERY    . BREAST BIOPSY Right 2007   benign  . CHOLECYSTECTOMY    . COLONOSCOPY     30 years ago was normal per pt.   Marland Kitchen DILATION AND CURETTAGE OF UTERUS    . ESOPHAGEAL DILATION     x 3  . fractured leg Right   . OOPHORECTOMY    . OVARIAN CYST REMOVAL  1972  . SALIVARY GLAND SURGERY    . TONSILLECTOMY     4 yoa  . TOTAL KNEE ARTHROPLASTY Left 12/24/2015   Procedure: LEFT TOTAL KNEE ARTHROPLASTY;  Surgeon: [name], MD;  Location: [facility];  Service: Orthopedics;  Laterality: Left;  . TOTAL KNEE ARTHROPLASTY Right 12/30/2017   Procedure: RIGHT TOTAL KNEE ARTHROPLASTY;  Surgeon: [name], MD;  Location: [facility];  Service: Orthopedics;  Laterality: Right;  . WRIST FRACTURE SURGERY  11/2008  Current Outpatient Medications  Medication Sig Dispense Refill  . acetaminophen (TYLENOL) 500 MG tablet Take 625 mg by mouth daily as needed for headache.     . albuterol (PROVENTIL HFA;VENTOLIN HFA) 108 (90 Base) MCG/ACT inhaler Inhale 2 puffs into the lungs every 4 (four) hours as needed for wheezing or shortness of breath. 1 Inhaler 5  . amitriptyline (ELAVIL) 50 MG tablet TAKE 2 TABLETS BY MOUTH ONCE DAILY AT NIGHT 180 tablet 0  . Ascorbic Acid (VITAMIN C) 1000 MG tablet Take 1,000 mg by mouth daily.    Marland Kitchen aspirin EC 81 MG tablet Take 81 mg by mouth daily.    Marland Kitchen buPROPion (WELLBUTRIN XL) 150 MG 24 hr tablet Take 1 tablet by mouth once daily 90 tablet 1  . calcium carbonate (TUMS - DOSED  IN MG ELEMENTAL CALCIUM) 500 MG chewable tablet Chew 2 tablets by mouth daily as needed for indigestion or heartburn.    . Cholecalciferol (VITAMIN D) 1000 UNITS capsule Take 1,000 Units by mouth daily.     . clonazePAM (KLONOPIN) 0.5 MG tablet Take 1 tablet (0.5 mg total) by mouth 2 (two) times daily as needed. 30 tablet 5  . cyclobenzaprine (FLEXERIL) 10 MG tablet Take 1 tablet (10 mg total) by mouth 3 (three) times daily as needed. for muscle spams 50 tablet 4  . diphenhydrAMINE (BENADRYL) 25 MG tablet Take 25 mg by mouth 2 (two) times daily.    Marland Kitchen FLUoxetine (PROZAC) 40 MG capsule Take 1 capsule by mouth twice daily 180 capsule 1  . guaiFENesin (MUCINEX) 600 MG 12 hr tablet Take 600 mg by mouth 2 (two) times daily as needed (congestion).    . lansoprazole (PREVACID) 30 MG capsule Take 1 capsule (30 mg total) by mouth daily at 12 noon. 90 capsule 3  . magnesium oxide (MAG-OX) 400 MG tablet Take 400 mg by mouth daily.    . metoprolol tartrate (LOPRESSOR) 25 MG tablet Take 1/2 (one-half) tablet by mouth twice daily 90 tablet 0  . Multiple Vitamin (MULTIVITAMIN) tablet Take 1 tablet by mouth daily.    . naproxen sodium (ALEVE) 220 MG tablet Take 220 mg by mouth daily.    . pantoprazole (PROTONIX) 40 MG tablet TAKE ONE TABLET BY MOUTH 30 MINUTES PRIOR TO BREAKFAST 90 tablet 2  . polycarbophil (EQ FIBER THERAPY) 625 MG tablet Take 625 mg by mouth daily. FIBER THERAPY    . rosuvastatin (CRESTOR) 40 MG tablet Take 1 tablet by mouth once daily 90 tablet 3  . Simethicone 125 MG CAPS Take 125 mg by mouth daily as needed (gas).     No current facility-administered medications for this visit.     Allergies:   Diazepam    Social History:  The patient  reports that she has been smoking cigarettes. She has a 33.75 pack-year smoking history. She has never used smokeless tobacco. She reports current alcohol use. She reports that she does not use drugs.   Family History:  The patient's family history  includes Arthritis in her mother; Breast cancer in her sister; Cancer in her paternal grandmother; Colon polyps in her mother and sister; Diabetes in her mother; Heart disease in her mother; Hypercholesterolemia in her sister; Hyperlipidemia in her mother.    ROS:  Please see the history of present illness.   Otherwise, review of systems are positive for none.   All other systems are reviewed and negative.    PHYSICAL EXAM: VS:  BP 120/70 (BP Location: Left Arm, Patient  Position: Sitting, Cuff Size: Normal)   Pulse 80   Temp (!) 97 F (36.1 C)   Ht 5\' 7"  (1.702 m)   Wt 227 lb 8 oz (103.2 kg)   BMI 35.63 kg/m  , BMI Body mass index is 35.63 kg/m. GEN: Well nourished, well developed, in no acute distress  HEENT: normal  Neck: no JVD, carotid bruits, or masses Cardiac: RRR; no  rubs, or gallops,no edema . 2/6 crescendo decrescendo systolic murmur in the aortic area which is  mid peaking. Respiratory:  clear to auscultation bilaterally, normal work of breathing GI: soft, nontender, nondistended, + BS MS: no deformity or atrophy  Skin: warm and dry, no rash Neuro:  Strength and sensation are intact Psych: euthymic mood, full affect   EKG:  EKG is not ordered today. Recent EKG was reviewed which showed sinus rhythm with  right bundle branch block.   Recent Labs: 12/15/2018: ALT 22 05/31/2019: BUN 9; Creatinine, Ser 0.83; Hemoglobin 12.7; Platelets 177.0; Potassium 4.0; Sodium 139    Lipid Panel    Component Value Date/Time   CHOL 148 12/15/2018 1302   CHOL 201 (H) 09/18/2013 1016   TRIG 97 12/15/2018 1302   HDL 64 12/15/2018 1302   HDL 53 09/18/2013 1016   CHOLHDL 2.3 12/15/2018 1302   VLDL 19 12/15/2018 1302   LDLCALC 65 12/15/2018 1302   LDLCALC 130 (H) 09/18/2013 1016   LDLDIRECT 209.2 05/28/2009 0909      Wt Readings from Last 3 Encounters:  10/17/19 227 lb 8 oz (103.2 kg)  09/06/19 227 lb 12 oz (103.3 kg)  06/05/19 230 lb (104.3 kg)         ASSESSMENT AND  PLAN:  1.  Moderate nonobstructive coronary artery disease: No anginal symptoms.  Continue medical therapy and aggressive treatment of risk factors.  2.  Aortic stenosis: This was moderate on most recent echocardiogram in September.  Repeat echo in September 2021.  3.  Palpitations: Previous mild PACs. Symptoms are well-controlled on small dose metoprolol.  4 . Tobacco use: I again discussed with her the importance of smoking cessation.  She cut down on tobacco use significantly.  5.  Hyperlipidemia: Continue high-dose rosuvastatin.  Most recent lipid profile in January showed an LDL of 65.   Disposition:   FU with me in 12 months  Signed,  Kathlyn Sacramento, MD  10/17/2019 3:07 PM    [facility]

## 2019-10-24 ENCOUNTER — Emergency Department: Payer: Medicare Other

## 2019-10-24 ENCOUNTER — Encounter: Payer: Self-pay | Admitting: Emergency Medicine

## 2019-10-24 ENCOUNTER — Emergency Department
Admission: EM | Admit: 2019-10-24 | Discharge: 2019-10-24 | Disposition: A | Discharge status: Home / Self Care | Payer: Medicare Other | Attending: Student | Admitting: Student

## 2019-10-24 DIAGNOSIS — Y9389 Activity, other specified: Secondary | ICD-10-CM | POA: Insufficient documentation

## 2019-10-24 DIAGNOSIS — M549 Dorsalgia, unspecified: Secondary | ICD-10-CM | POA: Diagnosis not present

## 2019-10-24 DIAGNOSIS — Z79899 Other long term (current) drug therapy: Secondary | ICD-10-CM | POA: Diagnosis not present

## 2019-10-24 DIAGNOSIS — M25551 Pain in right hip: Secondary | ICD-10-CM | POA: Insufficient documentation

## 2019-10-24 DIAGNOSIS — J449 Chronic obstructive pulmonary disease, unspecified: Secondary | ICD-10-CM | POA: Diagnosis not present

## 2019-10-24 DIAGNOSIS — Z96652 Presence of left artificial knee joint: Secondary | ICD-10-CM | POA: Insufficient documentation

## 2019-10-24 DIAGNOSIS — M542 Cervicalgia: Secondary | ICD-10-CM | POA: Diagnosis not present

## 2019-10-24 DIAGNOSIS — Z7982 Long term (current) use of aspirin: Secondary | ICD-10-CM | POA: Insufficient documentation

## 2019-10-24 DIAGNOSIS — S0003XA Contusion of scalp, initial encounter: Secondary | ICD-10-CM | POA: Diagnosis not present

## 2019-10-24 DIAGNOSIS — S79911A Unspecified injury of right hip, initial encounter: Secondary | ICD-10-CM | POA: Diagnosis not present

## 2019-10-24 DIAGNOSIS — R0902 Hypoxemia: Secondary | ICD-10-CM | POA: Diagnosis not present

## 2019-10-24 DIAGNOSIS — W01198A Fall on same level from slipping, tripping and stumbling with subsequent striking against other object, initial encounter: Secondary | ICD-10-CM | POA: Insufficient documentation

## 2019-10-24 DIAGNOSIS — R52 Pain, unspecified: Secondary | ICD-10-CM | POA: Diagnosis not present

## 2019-10-24 DIAGNOSIS — R519 Headache, unspecified: Secondary | ICD-10-CM | POA: Insufficient documentation

## 2019-10-24 DIAGNOSIS — Y92013 Bedroom of single-family (private) house as the place of occurrence of the external cause: Secondary | ICD-10-CM | POA: Insufficient documentation

## 2019-10-24 DIAGNOSIS — M545 Low back pain: Secondary | ICD-10-CM | POA: Diagnosis not present

## 2019-10-24 DIAGNOSIS — R42 Dizziness and giddiness: Secondary | ICD-10-CM | POA: Insufficient documentation

## 2019-10-24 DIAGNOSIS — Z96651 Presence of right artificial knee joint: Secondary | ICD-10-CM | POA: Diagnosis not present

## 2019-10-24 DIAGNOSIS — F17210 Nicotine dependence, cigarettes, uncomplicated: Secondary | ICD-10-CM | POA: Insufficient documentation

## 2019-10-24 DIAGNOSIS — E119 Type 2 diabetes mellitus without complications: Secondary | ICD-10-CM | POA: Insufficient documentation

## 2019-10-24 DIAGNOSIS — W19XXXA Unspecified fall, initial encounter: Secondary | ICD-10-CM

## 2019-10-24 DIAGNOSIS — S199XXA Unspecified injury of neck, initial encounter: Secondary | ICD-10-CM | POA: Diagnosis not present

## 2019-10-24 LAB — CBC WITH DIFFERENTIAL/PLATELET
Abs Immature Granulocytes: 0.07 10*3/uL (ref 0.00–0.07)
Basophils Absolute: 0.1 10*3/uL (ref 0.0–0.1)
Basophils Relative: 0 %
Eosinophils Absolute: 0 10*3/uL (ref 0.0–0.5)
Eosinophils Relative: 0 %
HCT: 37.1 % (ref 36.0–46.0)
Hemoglobin: 12.4 g/dL (ref 12.0–15.0)
Immature Granulocytes: 1 %
Lymphocytes Relative: 5 %
Lymphs Abs: 0.6 10*3/uL — ABNORMAL LOW (ref 0.7–4.0)
MCH: 34 pg (ref 26.0–34.0)
MCHC: 33.4 g/dL (ref 30.0–36.0)
MCV: 101.6 fL — ABNORMAL HIGH (ref 80.0–100.0)
Monocytes Absolute: 0.8 10*3/uL (ref 0.1–1.0)
Monocytes Relative: 6 %
Neutro Abs: 12.1 10*3/uL — ABNORMAL HIGH (ref 1.7–7.7)
Neutrophils Relative %: 88 %
Platelets: 164 10*3/uL (ref 150–400)
RBC: 3.65 MIL/uL — ABNORMAL LOW (ref 3.87–5.11)
RDW: 10.8 % — ABNORMAL LOW (ref 11.5–15.5)
WBC: 13.7 10*3/uL — ABNORMAL HIGH (ref 4.0–10.5)
nRBC: 0 % (ref 0.0–0.2)

## 2019-10-24 LAB — URINALYSIS, COMPLETE (UACMP) WITH MICROSCOPIC
Bacteria, UA: NONE SEEN
Bilirubin Urine: NEGATIVE
Glucose, UA: NEGATIVE mg/dL
Ketones, ur: NEGATIVE mg/dL
Nitrite: NEGATIVE
Protein, ur: NEGATIVE mg/dL
Specific Gravity, Urine: 1.009 (ref 1.005–1.030)
pH: 7 (ref 5.0–8.0)

## 2019-10-24 LAB — COMPREHENSIVE METABOLIC PANEL
ALT: 16 U/L (ref 0–44)
AST: 21 U/L (ref 15–41)
Albumin: 4.1 g/dL (ref 3.5–5.0)
Alkaline Phosphatase: 40 U/L (ref 38–126)
Anion gap: 7 (ref 5–15)
BUN: 12 mg/dL (ref 8–23)
CO2: 23 mmol/L (ref 22–32)
Calcium: 8.8 mg/dL — ABNORMAL LOW (ref 8.9–10.3)
Chloride: 106 mmol/L (ref 98–111)
Creatinine, Ser: 0.9 mg/dL (ref 0.44–1.00)
GFR calc Af Amer: >60 mL/min (ref >60)
GFR calc non Af Amer: >60 mL/min (ref >60)
Glucose, Bld: 179 mg/dL — ABNORMAL HIGH (ref 70–99)
Potassium: 3.9 mmol/L (ref 3.5–5.1)
Sodium: 136 mmol/L (ref 135–145)
Total Bilirubin: 0.8 mg/dL (ref 0.3–1.2)
Total Protein: 6.9 g/dL (ref 6.5–8.1)

## 2019-10-24 LAB — TROPONIN I (HIGH SENSITIVITY): Troponin I (High Sensitivity): 12 ng/L (ref <18)

## 2019-10-24 NOTE — ED Triage Notes (Signed)
Pt arrived to ED via EMS from home where pt reported she fell this AM going to bathroom and hit her head on wall. Pt c/o lower back pain. Pt ambulatory on scene per EMS. Pt denies LOC.

## 2019-10-24 NOTE — ED Notes (Signed)
Pt taken to car via Wheel chair and able to get into car without assistance. NAD at time of discharge.

## 2019-10-24 NOTE — ED Notes (Signed)
Pt reports that she had onset of dizziness that caused her fall; protocols entered

## 2019-10-24 NOTE — ED Provider Notes (Signed)
[name] Emergency Department Provider Note  ____________________________________________   First MD Initiated Contact with Patient 10/24/19 [phone]     (approximate)  I have reviewed the triage vital signs and the nursing notes.  History  Chief Complaint Fall    HPI Amanda [name] is a 68 y.o. female with past medical history as below who presents to the emergency department for a nonsyncopal fall.  Patient states she was getting up in the middle of the night to use the restroom.  When she stood up from her bed she lost her balance and fell, hitting her head on the wall.  She reports posterior head pain, bilateral lower paraspinal pain, and right hip pain. She states she felt slightly lightheaded when she stood up form the bed, but denies any true dizziness or room spinning sensation.  She denies any difficulty breathing, visual changes, palpitations, chest pain associated.  She is not on any blood thinning medications.   Past Medical Hx Past Medical History:  Diagnosis Date  . 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 sting    bee stings  . Anxiety   . Aortic stenosis    a. 2013: nl LV sys fxn, mild MR, no evidence of pulm htn; b. TTE 8/17: EF 60-65%, no RWMA, nl LV dia fxn,  mild AS, mod AI  . COPD (chronic obstructive pulmonary disease) ([place]) 10/30/2016  . Degenerative arthritis of knee, bilateral 06/16/2015  . Depression   . Diabetes ([place])    diet managed-type 11  . GERD (gastroesophageal reflux disease)   . Heart murmur   . Hiatal hernia with gastroesophageal reflux 1997  . Hyperlipidemia   . Hypertension   . Neuromuscular disorder ([place])    nerve damage in back and shoulder  . Osteoporosis   . Palpitations    a. 03/2012: 48-hour Holter monitor showed intermittent sinus tachycardia with occasional PACs, without other significant arrhythmia  . Thyroid disease    hyperthyroidism as a teenager - received some injections age 44 that corrected this   . Tobacco abuse      Problem List Patient Active Problem List   Diagnosis Date Noted  . Myelopathy ([place]) 10/26/2018  . 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 sting   . COPD (chronic obstructive pulmonary disease) ([place]) 10/30/2016  . Gastroesophageal reflux disease 09/17/2016  . Memory loss 10/29/2015  . Primary osteoarthritis of right knee 06/16/2015  . Smoker 05/10/2015  . Solitary pulmonary nodule 03/28/2013  . Hypertension   . UNSPECIFIED VITAMIN D DEFICIENCY 05/29/2009  . Pure hypercholesterolemia 12/07/2007  . Major depressive disorder, recurrent episode, in partial remission ([place]) 12/07/2007  . Diabetes mellitus type 2, controlled ([place]) 08/10/2007    Past Surgical Hx Past Surgical History:  Procedure Laterality Date  . ABDOMINAL HYSTERECTOMY  1993  . ANTERIOR CERVICAL DECOMP/DISCECTOMY FUSION N/A 10/26/2018   Procedure: ACDF C4-C5 C5-C6 C6-C7;  Surgeon: Kary Kos, MD;  Location: [facility];  Service: Neurosurgery;  Laterality: N/A;  . APPENDECTOMY    . BACK SURGERY    . BREAST BIOPSY Right 2007   benign  . CHOLECYSTECTOMY    . COLONOSCOPY     30 years ago was normal per pt.   Marland Kitchen DILATION AND CURETTAGE OF UTERUS    . ESOPHAGEAL DILATION     x 3  . fractured leg Right   . OOPHORECTOMY    . OVARIAN CYST REMOVAL  1972  . SALIVARY GLAND SURGERY    . TONSILLECTOMY     4 yoa  . TOTAL  KNEE ARTHROPLASTY Left 12/24/2015   Procedure: LEFT TOTAL KNEE ARTHROPLASTY;  Surgeon: [name], MD;  Location: [place];  Service: Orthopedics;  Laterality: Left;  . TOTAL KNEE ARTHROPLASTY Right 12/30/2017   Procedure: RIGHT TOTAL KNEE ARTHROPLASTY;  Surgeon: [name], MD;  Location: [place];  Service: Orthopedics;  Laterality: Right;  . WRIST FRACTURE SURGERY  11/2008    Medications Prior to Admission medications   Medication Sig Start Date End Date Taking? Authorizing Provider  acetaminophen (TYLENOL) 500 MG tablet Take 625 mg by mouth daily as needed for headache.     [provider]  albuterol (PROVENTIL  HFA;VENTOLIN HFA) 108 (90 Base) MCG/ACT inhaler Inhale 2 puffs into the lungs every 4 (four) hours as needed for wheezing or shortness of breath. 07/27/18   Copland, Frederico Hamman, MD  amitriptyline (ELAVIL) 50 MG tablet TAKE 2 TABLETS BY MOUTH ONCE DAILY AT NIGHT 10/05/19   Jessy Oto, MD  Ascorbic Acid (VITAMIN C) 1000 MG tablet Take 1,000 mg by mouth daily.    [provider]  aspirin EC 81 MG tablet Take 81 mg by mouth daily.    [provider]  buPROPion (WELLBUTRIN XL) 150 MG 24 hr tablet Take 1 tablet by mouth once daily 07/10/19   Copland, Frederico Hamman, MD  calcium carbonate (TUMS - DOSED IN MG ELEMENTAL CALCIUM) 500 MG chewable tablet Chew 2 tablets by mouth daily as needed for indigestion or heartburn.    [provider]  Cholecalciferol (VITAMIN D) 1000 UNITS capsule Take 1,000 Units by mouth daily.     [provider]  clonazePAM (KLONOPIN) 0.5 MG tablet Take 1 tablet (0.5 mg total) by mouth 2 (two) times daily as needed. 10/05/19   Copland, Frederico Hamman, MD  cyclobenzaprine (FLEXERIL) 10 MG tablet Take 1 tablet (10 mg total) by mouth 3 (three) times daily as needed. for muscle spams 03/31/19   Copland, Frederico Hamman, MD  diphenhydrAMINE (BENADRYL) 25 MG tablet Take 25 mg by mouth 2 (two) times daily.    [provider]  FLUoxetine (PROZAC) 40 MG capsule Take 1 capsule by mouth twice daily 10/10/19   Copland, Frederico Hamman, MD  guaiFENesin (MUCINEX) 600 MG 12 hr tablet Take 600 mg by mouth 2 (two) times daily as needed (congestion).    [provider]  lansoprazole (PREVACID) 30 MG capsule Take 1 capsule (30 mg total) by mouth daily at 12 noon. 02/09/18   Copland, Frederico Hamman, MD  magnesium oxide (MAG-OX) 400 MG tablet Take 400 mg by mouth daily.    [provider]  metoprolol tartrate (LOPRESSOR) 25 MG tablet Take 1/2 (one-half) tablet by mouth twice daily 09/11/19   Wellington Hampshire, MD  Multiple Vitamin (MULTIVITAMIN) tablet Take 1 tablet by mouth daily.     [provider]  naproxen sodium (ALEVE) 220 MG tablet Take 220 mg by mouth daily.    [provider]  pantoprazole (PROTONIX) 40 MG tablet TAKE ONE TABLET BY MOUTH 30 MINUTES PRIOR TO BREAKFAST 08/22/19   Bedsole, Amy E, MD  polycarbophil (EQ FIBER THERAPY) 625 MG tablet Take 625 mg by mouth daily. FIBER THERAPY    [provider]  rosuvastatin (CRESTOR) 40 MG tablet Take 1 tablet by mouth once daily 07/10/19   Wellington Hampshire, MD  Simethicone 125 MG CAPS Take 125 mg by mouth daily as needed (gas).    [provider]    Allergies Diazepam  Family Hx Family History  Problem Relation Age of Onset  .  Hyperlipidemia Mother   . Arthritis Mother   . Diabetes Mother   . Heart disease Mother   . Colon polyps Mother   . Cancer Paternal Grandmother        breast  . Hypercholesterolemia Sister   . Colon polyps Sister   . Breast cancer Sister   . Colon cancer Neg Hx   . Esophageal cancer Neg Hx   . Rectal cancer Neg Hx   . Stomach cancer Neg Hx   . Pancreatic cancer Neg Hx     Social Hx Social History   Tobacco Use  . Smoking status: Current Every Day Smoker    Packs/day: 0.75    Years: 45.00    Pack years: 33.75    Types: Cigarettes  . Smokeless tobacco: Never Used  . Tobacco comment: trying to quit before knee surgery  Substance Use Topics  . Alcohol use: Yes    Alcohol/week: 0.0 standard drinks    Comment: glass of wine twice a month   . Drug use: No     Review of Systems  Constitutional: Negative for fever, chills. + fall Eyes: Negative for visual changes. ENT: Negative for sore throat. Cardiovascular: Negative for chest pain. Respiratory: Negative for shortness of breath. Gastrointestinal: Negative for nausea, vomiting.  Genitourinary: Negative for dysuria. Musculoskeletal: Negative for leg swelling. Skin: Negative for rash. Neurological: Negative for for headaches. + lightheadedness   Physical Exam  Vital Signs: ED  Triage Vitals  Enc Vitals Group     BP 10/24/19 0356 126/68     Pulse Rate 10/24/19 0356 94     Resp 10/24/19 0356 18     Temp 10/24/19 0356 99.2 F (37.3 C)     Temp Source 10/24/19 0356 Oral     SpO2 10/24/19 0356 98 %     Weight 10/24/19 0357 228 lb (103.4 kg)     Height 10/24/19 0357 5\' 8"  (1.727 m)     Head Circumference --      Peak Flow --      Pain Score --      Pain Loc --      Pain Edu? --      Excl. in [place]? --     Constitutional: Alert and oriented.  Head: Normocephalic. Atraumatic. Eyes: Conjunctivae clear. Sclera anicteric. Nose: No congestion. No rhinorrhea. Mouth/Throat: Wearing mask.  Neck: No stridor.  No midline C-spine tenderness. Cardiovascular: Normal rate, regular rhythm. Extremities well perfused.  Chest wall stable and nontender. Respiratory: Normal respiratory effort.  Lungs CTAB. Gastrointestinal: Soft. Non-tender. Non-distended.  Pelvis: Tender to palpation about the right hip, no pain with gentle logroll.  Stable with AP and lateral compression. FROM bilateral knees, ankles.  Musculoskeletal: No lower extremity edema. No deformities. Neurologic:  Normal speech and language. No gross focal neurologic deficits are appreciated.  Skin: Skin is warm, dry and intact. No rash noted. Back: No midline T/L-spine tenderness.  Bilateral paraspinal L-spine tenderness. Psychiatric: Mood and affect are appropriate for situation.  EKG  Personally reviewed.   Rate: 97 Rhythm: sinus Axis: LAD Intervals: RBBB TWI in III, V1, V3 (TWI in V1 seen on prior, TW flattening in III seen on prior) No STEMI    Radiology  CT:  IMPRESSION:  1. No evidence of acute intracranial or cervical spine injury.  2. Right parietal scalp contusion without calvarial fracture.   Procedures  Procedure(s) performed (including critical care):  Procedures   Initial Impression / Assessment and Plan / ED Course  68  y.o. female who presents to the ED for a fall, as above.   Will obtain CT imaging, XR to rule out related injuries, and obtain basic labs to evaluate for any occult underlying etiology for her fall.  CT imaging negative for any acute traumatic injuries.  EKG without evidence of arrhythmia, no acute ischemic changes, troponin negative.  Electrolytes without actionable derangements.  No evidence of infection on UA.  Nonspecific leukocytosis on CBC.  Given negative work-up, patient is stable for discharge with outpatient follow-up.  Given return precautions.   Final Clinical Impression(s) / ED Diagnosis  Final diagnoses:  Fall, initial encounter       Note:  This document was prepared using Dragon voice recognition software and may include unintentional dictation errors.   Lilia Pro., MD 10/24/19 0730

## 2019-10-24 NOTE — Discharge Instructions (Signed)
Thank you for letting us take care of you in the emergency department today. Your scans did not show any acute injuries or fractures.  Please continue to take any regular, prescribed medications.   Please follow up with: - Your primary care doctor to review your ER visit and follow up on your symptoms.   Please return to the ER for any new or worsening symptoms.

## 2019-11-06 ENCOUNTER — Other Ambulatory Visit: Payer: Self-pay

## 2019-11-06 ENCOUNTER — Ambulatory Visit (INDEPENDENT_AMBULATORY_CARE_PROVIDER_SITE_OTHER)
Admission: RE | Admit: 2019-11-06 | Discharge: 2019-11-06 | Disposition: A | Discharge status: Home / Self Care | Payer: Medicare Other | Source: Ambulatory Visit | Attending: Family Medicine | Admitting: Family Medicine

## 2019-11-06 ENCOUNTER — Ambulatory Visit (INDEPENDENT_AMBULATORY_CARE_PROVIDER_SITE_OTHER): Payer: Medicare Other | Admitting: Family Medicine

## 2019-11-06 ENCOUNTER — Encounter: Payer: Self-pay | Admitting: Family Medicine

## 2019-11-06 ENCOUNTER — Other Ambulatory Visit: Payer: Self-pay | Admitting: *Deleted

## 2019-11-06 VITALS — BP 120/64 | HR 86 | Temp 98.2°F | Ht 66.0 in | Wt 230.2 lb

## 2019-11-06 DIAGNOSIS — I959 Hypotension, unspecified: Secondary | ICD-10-CM

## 2019-11-06 DIAGNOSIS — F17200 Nicotine dependence, unspecified, uncomplicated: Secondary | ICD-10-CM

## 2019-11-06 DIAGNOSIS — W19XXXA Unspecified fall, initial encounter: Secondary | ICD-10-CM | POA: Diagnosis not present

## 2019-11-06 DIAGNOSIS — I2510 Atherosclerotic heart disease of native coronary artery without angina pectoris: Secondary | ICD-10-CM

## 2019-11-06 MED ORDER — CLONAZEPAM 0.5 MG PO TABS: 0.5000 mg | ORAL_TABLET | Freq: Two times a day (BID) | ORAL | 5 refills | Status: DC | PRN

## 2019-11-06 NOTE — Progress Notes (Signed)
Amanda [name] [name], MD Primary Care and [place] at [facility] Amanda [name], [address] Phone: [phone]  FAX: [place] - 68 y.o. female  MRN [id number]  Date of Birth: Jun 13, 1951  Visit Date: 11/06/2019  PCP: Owens Loffler, MD  Referred by: Owens Loffler, MD  Chief Complaint  Patient presents with  . Follow-up    ER-Fall   Subjective:   Amanda [name] is a 68 y.o. very pleasant female patient who presents with the following:  10/24/2019 ER fall. Stood up at bedside with getting up to go to the bathroom, and then she fell. Lost her balance and hit her head on the wall.  Low back pain, headache, and r hip pain.  Back is still bothering her and seeing Dr. Brien Few. When went to see dr. Saintclair Halsted and wanted her to see pain management.  One screw that is hanging around in the neck.   The ER did an extensive work-up which was effectively all normal.  Past Medical History, Surgical History, Social History, Family History, Problem List, Medications, and Allergies have been reviewed and updated if relevant.  Patient Active Problem List   Diagnosis Date Noted  . Myelopathy ([place]) 10/26/2018  . 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 sting   . COPD (chronic obstructive pulmonary disease) ([place]) 10/30/2016  . Gastroesophageal reflux disease 09/17/2016  . Memory loss 10/29/2015  . Primary osteoarthritis of right knee 06/16/2015  . Smoker 05/10/2015  . Solitary pulmonary nodule 03/28/2013  . Hypertension   . UNSPECIFIED VITAMIN D DEFICIENCY 05/29/2009  . Pure hypercholesterolemia 12/07/2007  . Major depressive disorder, recurrent episode, in partial remission ([place]) 12/07/2007  . Diabetes mellitus type 2, controlled ([place]) 08/10/2007    Past Medical History:  Diagnosis Date  . 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 sting    bee stings  . Anxiety   . Aortic stenosis    a. 2013: nl LV sys fxn, mild MR, no evidence of pulm htn; b. TTE 8/17: EF  60-65%, no RWMA, nl LV dia fxn,  mild AS, mod AI  . COPD (chronic obstructive pulmonary disease) ([place]) 10/30/2016  . Degenerative arthritis of knee, bilateral 06/16/2015  . Depression   . Diabetes ([place])    diet managed-type 11  . GERD (gastroesophageal reflux disease)   . Heart murmur   . Hiatal hernia with gastroesophageal reflux 1997  . Hyperlipidemia   . Hypertension   . Neuromuscular disorder ([place])    nerve damage in back and shoulder  . Osteoporosis   . Palpitations    a. 03/2012: 48-hour Holter monitor showed intermittent sinus tachycardia with occasional PACs, without other significant arrhythmia  . Thyroid disease    hyperthyroidism as a teenager - received some injections age 41 that corrected this   . Tobacco abuse     Past Surgical History:  Procedure Laterality Date  . ABDOMINAL HYSTERECTOMY  1993  . ANTERIOR CERVICAL DECOMP/DISCECTOMY FUSION N/A 10/26/2018   Procedure: ACDF C4-C5 C5-C6 C6-C7;  Surgeon: Amanda Kos, MD;  Location: [facility];  Service: Neurosurgery;  Laterality: N/A;  . APPENDECTOMY    . BACK SURGERY    . BREAST BIOPSY Right 2007   benign  . CHOLECYSTECTOMY    . COLONOSCOPY     30 years ago was normal per pt.   Marland Kitchen DILATION AND CURETTAGE OF UTERUS    . ESOPHAGEAL DILATION     x 3  . fractured leg Right   . OOPHORECTOMY    .  OVARIAN CYST REMOVAL  1972  . SALIVARY GLAND SURGERY    . TONSILLECTOMY     4 yoa  . TOTAL KNEE ARTHROPLASTY Left 12/24/2015   Procedure: LEFT TOTAL KNEE ARTHROPLASTY;  Surgeon: Amanda Pel, MD;  Location: [facility];  Service: Orthopedics;  Laterality: Left;  . TOTAL KNEE ARTHROPLASTY Right 12/30/2017   Procedure: RIGHT TOTAL KNEE ARTHROPLASTY;  Surgeon: Amanda Pel, MD;  Location: [facility];  Service: Orthopedics;  Laterality: Right;  . WRIST FRACTURE SURGERY  11/2008    Social History   Socioeconomic History  . Marital status: Married    Spouse name: Not on file  . Number of children: 2  . Years of education: Not on  file  . Highest education level: Not on file  Occupational History  . Occupation: Chief Executive Officer: [employer]  Social Needs  . Financial resource strain: Not on file  . Food insecurity    Worry: Not on file    Inability: Not on file  . Transportation needs    Medical: Not on file    Non-medical: Not on file  Tobacco Use  . Smoking status: Current Every Day Smoker    Packs/day: 0.75    Years: 45.00    Pack years: 33.75    Types: Cigarettes  . Smokeless tobacco: Never Used  . Tobacco comment: trying to quit before knee surgery  Substance and Sexual Activity  . Alcohol use: Yes    Alcohol/week: 0.0 standard drinks    Comment: glass of wine twice a month   . Drug use: No  . Sexual activity: Not Currently  Lifestyle  . Physical activity    Days per week: Not on file    Minutes per session: Not on file  . Stress: Not on file  Relationships  . Social Herbalist on phone: Not on file    Gets together: Not on file    Attends religious service: Not on file    Active member of club or organization: Not on file    Attends meetings of clubs or organizations: Not on file    Relationship status: Not on file  . Intimate partner violence    Fear of current or ex partner: Not on file    Emotionally abused: Not on file    Physically abused: Not on file    Forced sexual activity: Not on file  Other Topics Concern  . Not on file  Social History Narrative  . Not on file    Family History  Problem Relation Age of Onset  . Hyperlipidemia Mother   . Arthritis Mother   . Diabetes Mother   . Heart disease Mother   . Colon polyps Mother   . Cancer Paternal Grandmother        breast  . Hypercholesterolemia Sister   . Colon polyps Sister   . Breast cancer Sister   . Colon cancer Neg Hx   . Esophageal cancer Neg Hx   . Rectal cancer Neg Hx   . Stomach cancer Neg Hx   . Pancreatic cancer Neg Hx     Allergies  Allergen  Reactions  . Diazepam Other (See Comments)    REACTION: pt states she gets very angry and abusive verbally on this medication     Medication list reviewed and updated in full in [place].   GEN: No acute illnesses, no fevers, chills. GI: No n/v/d, eating normally Pulm:  No SOB Interactive and getting along well at home.  Otherwise, ROS is as per the HPI.  Objective:   BP 120/64   Pulse 86   Temp 98.2 F (36.8 C) (Temporal)   Ht 5\' 6"  (1.676 m)   Wt 230 lb 4 oz (104.4 kg)   SpO2 96%   BMI 37.16 kg/m   Orthostatic VS for the past 24 hrs:  BP- Lying Pulse- Lying BP- Sitting Pulse- Sitting BP- Standing at 0 minutes Pulse- Standing at 0 minutes  11/06/19 1222 143/66 79 100/60 84 121/61 88     GEN: WDWN, NAD, Non-toxic, A & O x 3 HEENT: Atraumatic, Normocephalic. Neck supple. No masses, No LAD. Ears and Nose: No external deformity. CV: RRR, No M/G/R. No JVD. No thrill. No extra heart sounds. PULM: CTA B, no wheezes, crackles, rhonchi. No retractions. No resp. distress. No accessory muscle use. EXTR: No c/c/e NEURO Normal gait.  Grossly neurologically intact today.  Neuro vascularly intact. PSYCH: Normally interactive. Conversant. Not depressed or anxious appearing.  Calm demeanor.   Laboratory and Imaging Data: Ct Head Wo Contrast  Result Date: 10/24/2019 CLINICAL DATA:  Head trauma with headache EXAM: CT HEAD WITHOUT CONTRAST CT CERVICAL SPINE WITHOUT CONTRAST TECHNIQUE: Multidetector CT imaging of the head and cervical spine was performed following the standard protocol without intravenous contrast. Multiplanar CT image reconstructions of the cervical spine were also generated. COMPARISON:  Cervical spine CT 08/11/2019.  Head CT 09/13/2017 FINDINGS: CT HEAD FINDINGS Brain: No evidence of acute infarction, hemorrhage, hydrocephalus, extra-axial collection or mass lesion/mass effect. Vascular: No hyperdense vessel or unexpected calcification. Skull: Right parietal  scalp swelling.  No calvarial fracture. Sinuses/Orbits: No evidence of injury CT CERVICAL SPINE FINDINGS Alignment: No traumatic malalignment. Degenerative anterolisthesis at C3-4 and C4-5 which is unchanged. Skull base and vertebrae: Negative for acute fracture. C4-5, C5-6, and C6-7 ACDF. No hardware complication. Intervertebral bridging bone is visualized each level. Soft tissues and spinal canal: No prevertebral fluid or swelling. No visible canal hematoma. Disc levels: Advanced cervical facet spurring on the right more than left at C3-4. Multilevel foraminal narrowing despite ACDF. Upper chest: No acute finding IMPRESSION: 1. No evidence of acute intracranial or cervical spine injury. 2. Right parietal scalp contusion without calvarial fracture. Electronically Signed   By: Monte Fantasia M.D.   On: 10/24/2019 04:27   Ct Cervical Spine Wo Contrast  Result Date: 10/24/2019 CLINICAL DATA:  Head trauma with headache EXAM: CT HEAD WITHOUT CONTRAST CT CERVICAL SPINE WITHOUT CONTRAST TECHNIQUE: Multidetector CT imaging of the head and cervical spine was performed following the standard protocol without intravenous contrast. Multiplanar CT image reconstructions of the cervical spine were also generated. COMPARISON:  Cervical spine CT 08/11/2019.  Head CT 09/13/2017 FINDINGS: CT HEAD FINDINGS Brain: No evidence of acute infarction, hemorrhage, hydrocephalus, extra-axial collection or mass lesion/mass effect. Vascular: No hyperdense vessel or unexpected calcification. Skull: Right parietal scalp swelling.  No calvarial fracture. Sinuses/Orbits: No evidence of injury CT CERVICAL SPINE FINDINGS Alignment: No traumatic malalignment. Degenerative anterolisthesis at C3-4 and C4-5 which is unchanged. Skull base and vertebrae: Negative for acute fracture. C4-5, C5-6, and C6-7 ACDF. No hardware complication. Intervertebral bridging bone is visualized each level. Soft tissues and spinal canal: No prevertebral fluid or  swelling. No visible canal hematoma. Disc levels: Advanced cervical facet spurring on the right more than left at C3-4. Multilevel foraminal narrowing despite ACDF. Upper chest: No acute finding IMPRESSION: 1. No evidence of acute intracranial or cervical spine injury. 2.  Right parietal scalp contusion without calvarial fracture. Electronically Signed   By: Monte Fantasia M.D.   On: 10/24/2019 04:27   Dg Hip Unilat With Pelvis 2-3 Views Right  Result Date: 10/24/2019 CLINICAL DATA:  Right hip pain after fall EXAM: DG HIP (WITH OR WITHOUT PELVIS) 2-3V RIGHT COMPARISON:  None. FINDINGS: There is no evidence of hip fracture or dislocation. No notable hip degeneration. Lower lumbar disc degeneration. Desiccated stool in the visualized lower abdomen. Osteopenia IMPRESSION: 1. No acute finding. 2. Osteopenia. Electronically Signed   By: Monte Fantasia M.D.   On: 10/24/2019 05:41    Results for orders placed or performed during the hospital encounter of 10/24/19  CBC with Differential  Result Value Ref Range   WBC 13.7 (H) 4.0 - 10.5 K/uL   RBC 3.65 (L) 3.87 - 5.11 MIL/uL   Hemoglobin 12.4 12.0 - 15.0 g/dL   HCT 37.1 36.0 - 46.0 %   MCV 101.6 (H) 80.0 - 100.0 fL   MCH 34.0 26.0 - 34.0 pg   MCHC 33.4 30.0 - 36.0 g/dL   RDW 10.8 (L) 11.5 - 15.5 %   Platelets 164 150 - 400 K/uL   nRBC 0.0 0.0 - 0.2 %   Neutrophils Relative % 88 %   Neutro Abs 12.1 (H) 1.7 - 7.7 K/uL   Lymphocytes Relative 5 %   Lymphs Abs 0.6 (L) 0.7 - 4.0 K/uL   Monocytes Relative 6 %   Monocytes Absolute 0.8 0.1 - 1.0 K/uL   Eosinophils Relative 0 %   Eosinophils Absolute 0.0 0.0 - 0.5 K/uL   Basophils Relative 0 %   Basophils Absolute 0.1 0.0 - 0.1 K/uL   Immature Granulocytes 1 %   Abs Immature Granulocytes 0.07 0.00 - 0.07 K/uL  Comprehensive metabolic panel  Result Value Ref Range   Sodium 136 135 - 145 mmol/L   Potassium 3.9 3.5 - 5.1 mmol/L   Chloride 106 98 - 111 mmol/L   CO2 23 22 - 32 mmol/L   Glucose, Bld  179 (H) 70 - 99 mg/dL   BUN 12 8 - 23 mg/dL   Creatinine, Ser 0.90 0.44 - 1.00 mg/dL   Calcium 8.8 (L) 8.9 - 10.3 mg/dL   Total Protein 6.9 6.5 - 8.1 g/dL   Albumin 4.1 3.5 - 5.0 g/dL   AST 21 15 - 41 U/L   ALT 16 0 - 44 U/L   Alkaline Phosphatase 40 38 - 126 U/L   Total Bilirubin 0.8 0.3 - 1.2 mg/dL   GFR calc non Af Amer >60 >60 mL/min   GFR calc Af Amer >60 >60 mL/min   Anion gap 7 5 - 15  Urinalysis, Complete w Microscopic  Result Value Ref Range   Color, Urine YELLOW (A) YELLOW   APPearance CLEAR (A) CLEAR   Specific Gravity, Urine 1.009 1.005 - 1.030   pH 7.0 5.0 - 8.0   Glucose, UA NEGATIVE NEGATIVE mg/dL   Hgb urine dipstick SMALL (A) NEGATIVE   Bilirubin Urine NEGATIVE NEGATIVE   Ketones, ur NEGATIVE NEGATIVE mg/dL   Protein, ur NEGATIVE NEGATIVE mg/dL   Nitrite NEGATIVE NEGATIVE   Leukocytes,Ua MODERATE (A) NEGATIVE   RBC / HPF 0-5 0 - 5 RBC/hpf   WBC, UA 6-10 0 - 5 WBC/hpf   Bacteria, UA NONE SEEN NONE SEEN   Squamous Epithelial / LPF 0-5 0 - 5   Mucus PRESENT   Troponin I (High Sensitivity)  Result Value Ref Range   Troponin  I (High Sensitivity) 12 <18 ng/L    Xr Chest 2 View  Result Date: 11/06/2019 CLINICAL DATA:  Smoker. COPD. EXAM: CHEST - 2 VIEW COMPARISON:  12/16/2015 FINDINGS: The heart size and mediastinal contours are within normal limits. Aortic atherosclerosis. Both lungs are clear. Focal eventration of left hemidiaphragm again seen. Cervical spine fusion hardware and lower thoracic spine degenerative disc disease noted. IMPRESSION: No active cardiopulmonary disease. Electronically Signed   By: Marlaine Hind M.D.   On: 11/06/2019 19:42    Assessment and Plan:     ICD-10-CM   1. Hypotension, unspecified hypotension type  I95.9   2. Fall, initial encounter  W19.XXXA   3. Smoker  F17.200 XR Chest 2 View   >25 minutes spent in face to face time with patient, >50% spent in counselling or coordination of care   Orthostatic hypotension, fall,  recommended that she stand for 15 to 20 seconds prior to moving when up from a better seating.  Prior smoker, and reasonable to get a chest x-ray.  Follow-up: No follow-ups on file.  No orders of the defined types were placed in this encounter.  Orders Placed This Encounter  Procedures  . XR Chest 2 View    Signed,  Mekiah Wahler T. Chancellor Vanderloop, MD   Outpatient Encounter Medications as of 11/06/2019  Medication Sig  . acetaminophen (TYLENOL) 650 MG CR tablet Take 650 mg by mouth 3 times/day as needed-between meals & bedtime (for headaches).  Marland Kitchen albuterol (PROVENTIL HFA;VENTOLIN HFA) 108 (90 Base) MCG/ACT inhaler Inhale 2 puffs into the lungs every 4 (four) hours as needed for wheezing or shortness of breath.  Marland Kitchen amitriptyline (ELAVIL) 50 MG tablet TAKE 2 TABLETS BY MOUTH ONCE DAILY AT NIGHT  . Ascorbic Acid (VITAMIN C) 1000 MG tablet Take 1,000 mg by mouth daily.  Marland Kitchen aspirin EC 81 MG tablet Take 81 mg by mouth daily.  Marland Kitchen buPROPion (WELLBUTRIN XL) 150 MG 24 hr tablet Take 1 tablet by mouth once daily  . calcium carbonate (TUMS - DOSED IN MG ELEMENTAL CALCIUM) 500 MG chewable tablet Chew 2 tablets by mouth daily as needed for indigestion or heartburn.  . Cholecalciferol (VITAMIN D) 1000 UNITS capsule Take 1,000 Units by mouth daily.   . cyclobenzaprine (FLEXERIL) 10 MG tablet Take 1 tablet (10 mg total) by mouth 3 (three) times daily as needed. for muscle spams  . diphenhydrAMINE (BENADRYL) 25 MG tablet Take 25 mg by mouth 2 (two) times daily.  Marland Kitchen FLUoxetine (PROZAC) 40 MG capsule Take 40 mg by mouth daily.  Marland Kitchen guaiFENesin (MUCINEX) 600 MG 12 hr tablet Take 600 mg by mouth 2 (two) times daily as needed (congestion).  . lansoprazole (PREVACID) 30 MG capsule Take 1 capsule (30 mg total) by mouth daily at 12 noon.  . magnesium oxide (MAG-OX) 400 MG tablet Take 400 mg by mouth daily.  . metoprolol tartrate (LOPRESSOR) 25 MG tablet Take 1/2 (one-half) tablet by mouth twice daily  . Multiple Vitamin  (MULTIVITAMIN) tablet Take 1 tablet by mouth daily.  . naproxen sodium (ALEVE) 220 MG tablet Take 220 mg by mouth daily.  . pantoprazole (PROTONIX) 40 MG tablet TAKE ONE TABLET BY MOUTH 30 MINUTES PRIOR TO BREAKFAST  . polycarbophil (EQ FIBER THERAPY) 625 MG tablet Take 625 mg by mouth daily. FIBER THERAPY  . rosuvastatin (CRESTOR) 40 MG tablet Take 1 tablet by mouth once daily  . Simethicone 125 MG CAPS Take 125 mg by mouth daily as needed (gas).  . [DISCONTINUED] clonazePAM (KLONOPIN)  0.5 MG tablet Take 1 tablet (0.5 mg total) by mouth 2 (two) times daily as needed.  . [DISCONTINUED] acetaminophen (TYLENOL) 500 MG tablet Take 625 mg by mouth daily as needed for headache.   . [DISCONTINUED] FLUoxetine (PROZAC) 40 MG capsule Take 1 capsule by mouth twice daily   No facility-administered encounter medications on file as of 11/06/2019.

## 2019-11-06 NOTE — Telephone Encounter (Signed)
Looks like refills were done on 10/05/2019 for #30 with 5 refills but was set on print so it never was received by the pharmacy. Please sign new refill request.

## 2019-11-09 ENCOUNTER — Other Ambulatory Visit: Payer: Self-pay | Admitting: *Deleted

## 2019-11-09 DIAGNOSIS — R519 Headache, unspecified: Secondary | ICD-10-CM

## 2019-11-09 MED ORDER — FLUOXETINE HCL 40 MG PO CAPS: 40.0000 mg | ORAL_CAPSULE | Freq: Every day | ORAL | 1 refills | Status: DC

## 2019-11-09 MED ORDER — CYCLOBENZAPRINE HCL 10 MG PO TABS: 10.0000 mg | ORAL_TABLET | Freq: Three times a day (TID) | ORAL | 2 refills | Status: DC | PRN

## 2019-11-09 MED ORDER — BUPROPION HCL ER (XL) 150 MG PO TB24: 150.0000 mg | ORAL_TABLET | Freq: Every day | ORAL | 1 refills | Status: DC

## 2019-11-09 MED ORDER — AMITRIPTYLINE HCL 50 MG PO TABS: ORAL_TABLET | ORAL | 1 refills | Status: DC

## 2019-11-09 MED ORDER — ROSUVASTATIN CALCIUM 40 MG PO TABS: 40.0000 mg | ORAL_TABLET | Freq: Every day | ORAL | 0 refills | Status: DC

## 2019-11-09 MED ORDER — LANSOPRAZOLE 30 MG PO CPDR: 30.0000 mg | DELAYED_RELEASE_CAPSULE | Freq: Every day | ORAL | 3 refills | Status: DC

## 2019-11-09 NOTE — Telephone Encounter (Signed)
Last office visit 11/06/2019 for ER follow up for Fall.  Last refilled 03/31/19 for #50 with 4 refills.

## 2019-11-13 ENCOUNTER — Other Ambulatory Visit: Payer: Self-pay | Admitting: *Deleted

## 2019-11-13 MED ORDER — PANTOPRAZOLE SODIUM 40 MG PO TBEC: DELAYED_RELEASE_TABLET | ORAL | 3 refills | Status: DC

## 2019-11-15 ENCOUNTER — Telehealth: Payer: Self-pay | Admitting: Cardiovascular Disease

## 2019-11-15 ENCOUNTER — Telehealth: Payer: Self-pay

## 2019-11-15 MED ORDER — METOPROLOL TARTRATE 25 MG PO TABS: ORAL_TABLET | ORAL | 3 refills | Status: DC

## 2019-11-15 NOTE — Telephone Encounter (Signed)
Requested Prescriptions   Signed Prescriptions Disp Refills  . metoprolol tartrate (LOPRESSOR) 25 MG tablet 90 tablet 3    Sig: Take 1/2 (one-half) tablet by mouth twice daily    Authorizing Provider: Kathlyn Sacramento A    Ordering User: Raelene Bott, BRANDY L

## 2019-11-15 NOTE — Telephone Encounter (Signed)
Left voicemail message advising patient to call Dr. Lillie Fragmin office for refill as he is the prescribing physician for this medication.

## 2019-11-15 NOTE — Telephone Encounter (Signed)
°*  STAT* If patient is at the pharmacy, call can be transferred to refill team.   1. Which medications need to be refilled? (please list name of each medication and dose if known) clonazePAM (KLONOPIN) 0.5 MG  2. Which pharmacy/location (including street and city if local pharmacy) is medication to be sent to? [place]   3. Do they need a 30 day or 90 day supply? 30 day

## 2019-11-16 ENCOUNTER — Telehealth: Payer: Self-pay | Admitting: *Deleted

## 2019-11-16 NOTE — Telephone Encounter (Signed)
Received fax from [facility] requesting PA for Pantoprazole 40 mg.  When completing PA on CoverMyMeds, it states that patient is on 2 PPIs which can cause problems like GI bleed.  Prevacid and Pantoprazole in on patient's current medication list.  Discussed this with Dr. Lorelei Pont and he states patient only needs to be taking the pantoprazole.  [employer] notified by fax to only fill the prescription for pantoprazole.  Medication list updated.  I have left 2 message for Amanda [name] to call me to discuss her medications.

## 2019-11-17 NOTE — Telephone Encounter (Signed)
Spoke with Mrs. Glendening about PPI medications.  She states she is only taking Pantoprazole.

## 2019-11-20 NOTE — Telephone Encounter (Signed)
Pantoprazole 40 mg approved through 12/06/2020.

## 2019-11-22 ENCOUNTER — Other Ambulatory Visit: Payer: Self-pay

## 2019-11-22 MED ORDER — METOPROLOL TARTRATE 25 MG PO TABS: ORAL_TABLET | ORAL | 3 refills | Status: DC

## 2019-11-28 ENCOUNTER — Telehealth: Payer: Self-pay | Admitting: Family Medicine

## 2019-11-28 MED ORDER — PANTOPRAZOLE SODIUM 40 MG PO TBEC: DELAYED_RELEASE_TABLET | ORAL | 3 refills | Status: DC

## 2019-11-28 NOTE — Telephone Encounter (Signed)
Pt called and left a voicemail regarding her medications.  Since she switched to [facility] there has been an issue with her stomach medication. Pt has been prescribed Pantoprazole 40mg  by Dr Lorelei Pont and [place] sent her Lansoprazole 30mg  .  Pt is wanting to know if Dr Lorelei Pont changed this or okayed an alternative or if the pharmacy made this change. Pt states that the new Rx is expensive.

## 2019-11-28 NOTE — Telephone Encounter (Signed)
Left message for Amanda [name] that she should only be taking the Pantoprazole 40 mg.  I sent a note to [facility] earlier in December telling them to only fill the Pantoprazole 40 mg so I am not sure why they sent her the Lansoprazole 30 mg. I resent refill for Pantoprazole 40 mg again today with another note advising them that patient is only taking pantoprazole and not lansoprazole.

## 2019-12-07 DIAGNOSIS — Z6825 Body mass index (BMI) 25.0-25.9, adult: Secondary | ICD-10-CM | POA: Insufficient documentation

## 2019-12-07 DIAGNOSIS — M47816 Spondylosis without myelopathy or radiculopathy, lumbar region: Secondary | ICD-10-CM | POA: Insufficient documentation

## 2019-12-20 ENCOUNTER — Telehealth: Payer: Self-pay | Admitting: Family Medicine

## 2019-12-20 NOTE — Telephone Encounter (Signed)
error 

## 2019-12-21 ENCOUNTER — Ambulatory Visit (INDEPENDENT_AMBULATORY_CARE_PROVIDER_SITE_OTHER)
Admission: RE | Admit: 2019-12-21 | Discharge: 2019-12-21 | Disposition: A | Discharge status: Home / Self Care | Payer: Medicare Other | Source: Ambulatory Visit | Attending: Family Medicine | Admitting: Family Medicine

## 2019-12-21 ENCOUNTER — Ambulatory Visit (INDEPENDENT_AMBULATORY_CARE_PROVIDER_SITE_OTHER): Payer: Medicare Other | Admitting: Family Medicine

## 2019-12-21 ENCOUNTER — Encounter: Payer: Self-pay | Admitting: Family Medicine

## 2019-12-21 ENCOUNTER — Other Ambulatory Visit: Payer: Self-pay

## 2019-12-21 VITALS — BP 130/90 | HR 86 | Temp 97.7°F | Ht 66.0 in | Wt 233.5 lb

## 2019-12-21 DIAGNOSIS — M25552 Pain in left hip: Secondary | ICD-10-CM

## 2019-12-21 DIAGNOSIS — M1612 Unilateral primary osteoarthritis, left hip: Secondary | ICD-10-CM | POA: Diagnosis not present

## 2019-12-21 DIAGNOSIS — M25532 Pain in left wrist: Secondary | ICD-10-CM

## 2019-12-21 DIAGNOSIS — G8929 Other chronic pain: Secondary | ICD-10-CM

## 2019-12-21 DIAGNOSIS — M65332 Trigger finger, left middle finger: Secondary | ICD-10-CM | POA: Diagnosis not present

## 2019-12-21 DIAGNOSIS — M19032 Primary osteoarthritis, left wrist: Secondary | ICD-10-CM | POA: Diagnosis not present

## 2019-12-21 MED ORDER — METHYLPREDNISOLONE ACETATE 40 MG/ML IJ SUSP
20.0000 mg | Freq: Once | INTRAMUSCULAR | Status: AC
  Administered 2019-12-21: 17:00:00 20 mg via INTRA_ARTICULAR

## 2019-12-21 NOTE — Progress Notes (Signed)
Amanda [name] T. Amanda Lemmerman, MD Primary Care and [place] at [facility] [place], [address] Phone: [phone]  FAX: [place] - 69 y.o. female  MRN [id number]  Date of Birth: 1951-08-02  Visit Date: 12/21/2019  PCP: Owens Loffler, MD  Referred by: Owens Loffler, MD  Chief Complaint  Patient presents with  . Trigger Finger    Left middle finger  . Wrist Pain    Left  . Hand Pain    Right    This visit occurred during the SARS-CoV-2 public health emergency.  Safety protocols were in place, including screening questions prior to the visit, additional usage of staff PPE, and extensive cleaning of exam room while observing appropriate contact time as indicated for disinfecting solutions.   Subjective:   Amanda [name] is a 69 y.o. very pleasant female patient with Body mass index is 37.69 kg/m. who presents with the following:  l 3rd trigger: She is having some pain and triggering on the right hand at the third digit.  This is hurting mostly in the morning.  She also has some diffuse pain about the wrist as well as in the fingers.  She does have a history of ORIF at the distal radius approximately 10 or 11 years ago.  She is having some pain in her wrist region bilaterally and she wonders about this.  ? About L prior surgery 11 years ago?  Esi in the back She has a known history of some significant back pain has had multiple epidural steroid injections in the past.  She is followed by [facility].  She had some questions about this for me as well.  Past Medical History, Surgical History, Social History, Family History, Problem List, Medications, and Allergies have been reviewed and updated if relevant.   GEN: No fevers, chills. Nontoxic. Primarily MSK c/o today. MSK: Detailed in the HPI GI: tolerating PO intake without difficulty Neuro: No numbness, parasthesias, or  tingling associated. Otherwise the pertinent positives of the ROS are noted above.   Objective:   BP 130/90   Pulse 86   Temp 97.7 F (36.5 C) (Temporal)   Ht 5\' 6"  (1.676 m)   Wt 233 lb 8 oz (105.9 kg)   SpO2 99%   BMI 37.69 kg/m    GEN: WDWN, NAD, Non-toxic, Alert & Oriented x 3 HEENT: Atraumatic, Normocephalic.  Ears and Nose: No external deformity. EXTR: No clubbing/cyanosis/edema NEURO: Normal gait.  PSYCH: Normally interactive. Conversant. Not depressed or anxious appearing.  Calm demeanor.    On the left side the patient does have an obvious trigger finger on the left middle finger with a palpable nodule.  Bilaterally, the patient does have some significant synovitis in the wrist.  There is moderately tender to palpation and she has restriction in range of motion bilaterally in all directions at the wrist.  She also does have some fairly diffuse Heberden's nodes with all of her fingers but she does have full range of motion.  She also has CMC pain at the basal joint bilaterally.  Grip is globally decreased.  Radiology: DG Wrist Complete Left  Result Date: 12/21/2019 CLINICAL DATA:  Chronic left wrist pain. Fracture of the distal radius in December 2007. EXAM: LEFT WRIST - COMPLETE 3+ VIEW COMPARISON:  Radiographs dated 11/24/2006 FINDINGS: There is moderate osteoarthritis at the first [facility] joint with less prominent arthritis between the scaphoid and trapezium and trapezoid.  Completely healed fracture of the distal radial metaphysis. Side plate and screws in place with no evidence of loosening. No acute abnormalities. IMPRESSION: 1. No acute abnormality. 2. Moderate osteoarthritis of the first [place] joint. 3. Healed fracture of the distal radial metaphysis. Electronically Signed   By: Lorriane Shire M.D.   On: 12/21/2019 16:48   DG Hip Unilat W OR W/O Pelvis 2-3 Views Left  Result Date: 12/21/2019 CLINICAL DATA:  Left hip and groin pain. EXAM: DG HIP (WITH OR WITHOUT PELVIS)  2-3V LEFT COMPARISON:  12/07/2019 and 10/24/2019 FINDINGS: There is no evidence of hip fracture or dislocation. There is a tiny osteophyte on the femoral head. Tiny marginal osteophyte of the superolateral aspect of the acetabulum. No change since the prior exams. IMPRESSION: No change since the prior study. Minimal degenerative changes of the left hip. Electronically Signed   By: Lorriane Shire M.D.   On: 12/21/2019 16:45     Assessment and Plan:     ICD-10-CM   1. Acquired trigger finger of left middle finger  M65.332 methylPREDNISolone acetate (DEPO-MEDROL) injection 20 mg  2. Left hip pain  M25.552 DG Hip Unilat W OR W/O Pelvis 2-3 Views Left  3. Chronic pain of left wrist  M25.532 DG Wrist Complete Left   G89.29    Classic trigger finger.  We will inject this today.  Posttraumatic osteoarthritis of the wrist.  Left-sided prior ORIF.  She also has some loss of motion and probable wrist osteoarthritis on the right as well.  She does have some anterior hip pain and she has a follow-up with pain management coming up in the near future.  I reassured her that on my independent review the plain films had minimal arthritic changes.  Tendon Sheath Injection Procedure Note ZAYNA KRANER 06-19-1951 Date of procedure: 12/21/2019  Procedure: Tendon Sheath Injection for Trigger Finger, L Middle Indications: Pain  Procedure Details Verbal consent was obtained. Risks (including rare risk of infection, potential risk for skin lightening and potential atrophy), benefits and alternatives were discussed. Prepped with Chloraprep and Ethyl Chloride used for anesthesia. Under sterile conditions, patient injected at palmar crease aiming distally with 45 degree angle towards nodule; injected directly into tendon sheath. Medication flowed freely without resistance.  Needle size: 22 gauge 1 1/2 inch Injection: 1/2 cc of Lidocaine 1% and Depo-Medrol 20 mg Medication: Depo-Medrol 20 mg   Follow-up: No  follow-ups on file.  Meds ordered this encounter  Medications  . methylPREDNISolone acetate (DEPO-MEDROL) injection 20 mg   Orders Placed This Encounter  Procedures  . DG Hip Unilat W OR W/O Pelvis 2-3 Views Left  . DG Wrist Complete Left    Signed,  Krishon Adkison T. Athol Bolds, MD   Outpatient Encounter Medications as of 12/21/2019  Medication Sig  . acetaminophen (TYLENOL) 650 MG CR tablet Take 650 mg by mouth 3 times/day as needed-between meals & bedtime (for headaches).  Marland Kitchen albuterol (PROVENTIL HFA;VENTOLIN HFA) 108 (90 Base) MCG/ACT inhaler Inhale 2 puffs into the lungs every 4 (four) hours as needed for wheezing or shortness of breath.  Marland Kitchen amitriptyline (ELAVIL) 50 MG tablet Take 2 tablets by mouth daily at bedtime.  . Ascorbic Acid (VITAMIN C) 1000 MG tablet Take 1,000 mg by mouth daily.  Marland Kitchen aspirin EC 81 MG tablet Take 81 mg by mouth daily.  Marland Kitchen buPROPion (WELLBUTRIN XL) 150 MG 24 hr tablet Take 1 tablet (150 mg total) by mouth daily.  . calcium carbonate (TUMS - DOSED IN MG ELEMENTAL  CALCIUM) 500 MG chewable tablet Chew 2 tablets by mouth daily as needed for indigestion or heartburn.  . Cholecalciferol (VITAMIN D) 1000 UNITS capsule Take 1,000 Units by mouth daily.   . clonazePAM (KLONOPIN) 0.5 MG tablet Take 1 tablet (0.5 mg total) by mouth 2 (two) times daily as needed.  . cyclobenzaprine (FLEXERIL) 10 MG tablet Take 1 tablet (10 mg total) by mouth 3 (three) times daily as needed. for muscle spams  . diphenhydrAMINE (BENADRYL) 25 MG tablet Take 25 mg by mouth 2 (two) times daily.  Marland Kitchen FLUoxetine (PROZAC) 40 MG capsule Take 1 capsule (40 mg total) by mouth daily.  Marland Kitchen guaiFENesin (MUCINEX) 600 MG 12 hr tablet Take 600 mg by mouth 2 (two) times daily as needed (congestion).  . magnesium oxide (MAG-OX) 400 MG tablet Take 400 mg by mouth daily.  . metoprolol tartrate (LOPRESSOR) 25 MG tablet Take 1/2 (one-half) tablet by mouth twice daily  . Multiple Vitamin (MULTIVITAMIN) tablet Take 1 tablet  by mouth daily.  . naproxen sodium (ALEVE) 220 MG tablet Take 220 mg by mouth daily.  . pantoprazole (PROTONIX) 40 MG tablet TAKE ONE TABLET BY MOUTH 30 MINUTES PRIOR TO BREAKFAST  . polycarbophil (EQ FIBER THERAPY) 625 MG tablet Take 625 mg by mouth daily. FIBER THERAPY  . rosuvastatin (CRESTOR) 40 MG tablet Take 1 tablet (40 mg total) by mouth daily.  . Simethicone 125 MG CAPS Take 125 mg by mouth daily as needed (gas).  . [EXPIRED] methylPREDNISolone acetate (DEPO-MEDROL) injection 20 mg    No facility-administered encounter medications on file as of 12/21/2019.

## 2019-12-26 DIAGNOSIS — M48062 Spinal stenosis, lumbar region with neurogenic claudication: Secondary | ICD-10-CM | POA: Diagnosis not present

## 2020-02-09 ENCOUNTER — Telehealth: Payer: Self-pay

## 2020-02-09 NOTE — Telephone Encounter (Signed)
Error

## 2020-02-19 ENCOUNTER — Other Ambulatory Visit: Payer: Self-pay

## 2020-02-19 ENCOUNTER — Ambulatory Visit (INDEPENDENT_AMBULATORY_CARE_PROVIDER_SITE_OTHER)
Admission: RE | Admit: 2020-02-19 | Discharge: 2020-02-19 | Disposition: A | Discharge status: Home / Self Care | Payer: Medicare Other | Source: Ambulatory Visit | Attending: Family Medicine | Admitting: Family Medicine

## 2020-02-19 ENCOUNTER — Ambulatory Visit (INDEPENDENT_AMBULATORY_CARE_PROVIDER_SITE_OTHER): Payer: Medicare Other | Admitting: Family Medicine

## 2020-02-19 ENCOUNTER — Encounter: Payer: Self-pay | Admitting: Family Medicine

## 2020-02-19 VITALS — BP 136/76 | HR 89 | Temp 97.7°F | Ht 66.0 in | Wt 235.0 lb

## 2020-02-19 DIAGNOSIS — R296 Repeated falls: Secondary | ICD-10-CM

## 2020-02-19 DIAGNOSIS — S6991XA Unspecified injury of right wrist, hand and finger(s), initial encounter: Secondary | ICD-10-CM | POA: Diagnosis not present

## 2020-02-19 DIAGNOSIS — S8992XA Unspecified injury of left lower leg, initial encounter: Secondary | ICD-10-CM

## 2020-02-19 DIAGNOSIS — S0993XA Unspecified injury of face, initial encounter: Secondary | ICD-10-CM

## 2020-02-19 MED ORDER — LORATADINE 10 MG PO TABS: 10.0000 mg | ORAL_TABLET | Freq: Every day | ORAL | 0 refills | Status: DC

## 2020-02-19 NOTE — Progress Notes (Signed)
This visit was conducted in person.  BP 136/76 (BP Location: Left Arm, Patient Position: Sitting, Cuff Size: Large)   Pulse 89   Temp 97.7 F (36.5 C) (Temporal)   Ht 5\' 6"  (1.676 m)   Wt 235 lb (106.6 kg)   SpO2 99%   BMI 37.93 kg/m   Orthostatic VS for the past 24 hrs (Last 3 readings):  BP- Lying BP- Standing at 0 minutes  02/19/20 1538 -- 134/72  02/19/20 1535 126/70 --   CC: fall  Subjective:    Patient ID: Amanda [name], female    DOB: 03/05/1951, 69 y.o.   MRN: [id number]  HPI: Amanda [name] is a 69 y.o. female presenting on 02/19/2020 for Fall [place] on 01/20/20 outside at her daughter's home.  Landed on right side of body hitting head.  Has mouth/jaw pain and swelling.  Cannot chew on right side of mouth.  Also, hit left knee and has pain and swelling.  H/o multiple falls.)   4 falls in the past 3 months. Every time she falls she's hit her head - on cement and on the wall. She's had previous upper orbital fracture on right after fall.   Latest fall occurred Saturday at daughter's house while carrying 40 ct box of water bottles through the house. She tripped over gate coming into the house. Hit R forehead, landed on R hand and L knee. Worried she may have fractured R cheekbone. R mouth swelling. She cannot put dentures in. Has been eating soft foods. No clear rhinorrhea. Can breathe through both sides of the nose. No slurred speech. No double vision. No facial numbness.   She denies premonitory symptoms with latest fall. She does get dizzy when standing up or turning around too quickly. Denies significant vertigo. She states she was previously positive for orthostatics. Endorses occasional foot paresthesias.   H/o bilateral knee replacement.   She regularly takes benadryl BID for allergies.  She takes flexeril 10mg  TID PRN muscle spasm (about 2-3 times a week).  She is on amitriptyline 100mg  nightly for back pain.  She takes klonopin 0.5mg  1/2 tab BID for anxiety.    She is on aspirin 81mg  daily.  Lab Results  Component Value Date   HGBA1C 6.5 05/31/2019  Diabetic, doesn't check sugars. Weight gain noted 2021.       Relevant past medical, surgical, family and social history reviewed and updated as indicated. Interim medical history since our last visit reviewed. Allergies and medications reviewed and updated. Outpatient Medications Prior to Visit  Medication Sig Dispense Refill  . acetaminophen (TYLENOL) 650 MG CR tablet Take 650 mg by mouth 3 times/day as needed-between meals & bedtime (for headaches).    Marland Kitchen albuterol (PROVENTIL HFA;VENTOLIN HFA) 108 (90 Base) MCG/ACT inhaler Inhale 2 puffs into the lungs every 4 (four) hours as needed for wheezing or shortness of breath. 1 Inhaler 5  . amitriptyline (ELAVIL) 50 MG tablet Take 2 tablets by mouth daily at bedtime. 180 tablet 1  . Ascorbic Acid (VITAMIN C) 1000 MG tablet Take 1,000 mg by mouth daily.    Marland Kitchen aspirin EC 81 MG tablet Take 81 mg by mouth daily.    Marland Kitchen buPROPion (WELLBUTRIN XL) 150 MG 24 hr tablet Take 1 tablet (150 mg total) by mouth daily. 90 tablet 1  . calcium carbonate (TUMS - DOSED IN MG ELEMENTAL CALCIUM) 500 MG chewable tablet Chew 2 tablets by mouth daily as needed for indigestion or heartburn.    Marland Kitchen  Cholecalciferol (VITAMIN D) 1000 UNITS capsule Take 1,000 Units by mouth daily.     . clonazePAM (KLONOPIN) 0.5 MG tablet Take 1 tablet (0.5 mg total) by mouth 2 (two) times daily as needed. 30 tablet 5  . cyclobenzaprine (FLEXERIL) 10 MG tablet Take 1 tablet (10 mg total) by mouth 3 (three) times daily as needed. for muscle spams 50 tablet 2  . FLUoxetine (PROZAC) 40 MG capsule Take 1 capsule (40 mg total) by mouth daily. 90 capsule 1  . guaiFENesin (MUCINEX) 600 MG 12 hr tablet Take 600 mg by mouth 2 (two) times daily as needed (congestion).    . magnesium oxide (MAG-OX) 400 MG tablet Take 400 mg by mouth daily.    . metoprolol tartrate (LOPRESSOR) 25 MG tablet Take 1/2 (one-half) tablet  by mouth twice daily 90 tablet 3  . Multiple Vitamin (MULTIVITAMIN) tablet Take 1 tablet by mouth daily.    . naproxen sodium (ALEVE) 220 MG tablet Take 220 mg by mouth daily.    . pantoprazole (PROTONIX) 40 MG tablet TAKE ONE TABLET BY MOUTH 30 MINUTES PRIOR TO BREAKFAST 90 tablet 3  . polycarbophil (EQ FIBER THERAPY) 625 MG tablet Take 625 mg by mouth daily. FIBER THERAPY    . rosuvastatin (CRESTOR) 40 MG tablet Take 1 tablet (40 mg total) by mouth daily. 90 tablet 0  . Simethicone 125 MG CAPS Take 125 mg by mouth daily as needed (gas).    . diphenhydrAMINE (BENADRYL) 25 MG tablet Take 25 mg by mouth 2 (two) times daily.     No facility-administered medications prior to visit.     Per HPI unless specifically indicated in ROS section below Review of Systems Objective:    BP 136/76 (BP Location: Left Arm, Patient Position: Sitting, Cuff Size: Large)   Pulse 89   Temp 97.7 F (36.5 C) (Temporal)   Ht 5\' 6"  (1.676 m)   Wt 235 lb (106.6 kg)   SpO2 99%   BMI 37.93 kg/m   Wt Readings from Last 3 Encounters:  02/19/20 235 lb (106.6 kg)  12/21/19 233 lb 8 oz (105.9 kg)  11/06/19 230 lb 4 oz (104.4 kg)    Physical Exam Vitals and nursing note reviewed.  Constitutional:      Appearance: Normal appearance. She is not ill-appearing.  HENT:     Head: Normocephalic and atraumatic.      Comments: Point tender to palpation R inner maxillary region below eye without significant swelling or bruising present    Right Ear: Hearing, tympanic membrane, ear canal and external ear normal.     Left Ear: Hearing, tympanic membrane, ear canal and external ear normal.     Nose: Nose normal.     Right Nostril: No septal hematoma.     Left Nostril: No septal hematoma.     Mouth/Throat:     Mouth: Mucous membranes are moist.     Pharynx: Oropharynx is clear. No pharyngeal swelling or posterior oropharyngeal erythema.     Tonsils: No tonsillar exudate.  Eyes:     General:        Right eye: No  discharge.        Left eye: No discharge.     Extraocular Movements: Extraocular movements intact.     Conjunctiva/sclera: Conjunctivae normal.     Pupils: Pupils are equal, round, and reactive to light.  Musculoskeletal:        General: Signs of injury present. No swelling. Normal range of motion.  Comments:  2+ rad pulses bilaterally L wrist WNL R wrist - discomfort to palpation at dorsal wrist without pain at scaphoid or at scaphoid. FROM at wrist without pain.  R knee WNL (s/p knee replacement)  L knee - bruising overlying patella with point tenderness at patella. FROM flexion/extension. No significant pain at patellar tendon  Skin:    General: Skin is warm and dry.     Findings: Bruising present.  Psychiatric:        Mood and Affect: Mood normal.        Behavior: Behavior normal.        Assessment & Plan:  This visit occurred during the SARS-CoV-2 public health emergency.  Safety protocols were in place, including screening questions prior to the visit, additional usage of staff PPE, and extensive cleaning of exam room while observing appropriate contact time as indicated for disinfecting solutions.   Problem List Items Addressed This Visit    Right wrist injury, initial encounter    Anticipate R wrist sprain - placed in thumb spica brace for support.       Recurrent falls - Primary    Denies significant premonitory symptoms. Negative orthostatics today.  Suggested minimizing anticholinergic/sedating medications. Will start by changing benadryl (she's taking BID) to claritin daily. I also suggested she take flexeril 1/2 tab when used, or discontinue. To discuss amitriptyline (sedating) and klonopin use with PCP. Pt agrees with plan.       Injury of left knee    Check L knee xray for further evaluation of bruising overlying patella after direct fall - clear on my read besides know wear and tear arthritis. Continue leg elevation as well as ice.       Relevant Orders    DG Knee Complete 4 Views Left   Facial injury    Possible fracture vs bony contusion at R maxilla. Reassuring story/exam. Continue ice use. Discussed update if not improving with this to consider maxillofacial CT for further evaluation. EOMI without pain.           Meds ordered this encounter  Medications  . loratadine (CLARITIN) 10 MG tablet    Sig: Take 1 tablet (10 mg total) by mouth daily.    Refill:  0   Orders Placed This Encounter  Procedures  . DG Knee Complete 4 Views Left    Standing Status:   Future    Number of Occurrences:   1    Standing Expiration Date:   04/20/2021    Order Specific Question:   Reason for Exam (SYMPTOM  OR DIAGNOSIS REQUIRED)    Answer:   fall onto L knee 4d ago    Order Specific Question:   Preferred imaging location?    Answer:   [facility]    Order Specific Question:   Radiology Contrast Protocol - do NOT remove file path    Answer:   [email or web address]    Patient Instructions  Stop benadryl. Start in its place claritin 10mg  daily for allergies.  Talk with Dr Lorelei Pont about flexeril, amitriptyline and klonopin.  Continue ice to wrist and knee and face, but not directly on the skin.  Use wrist brace provided today.  Left knee xray today.  We will watch facial pain - if not improving, let me know for CT scan.    Follow up plan: No follow-ups on file.  Ria Bush, MD

## 2020-02-19 NOTE — Patient Instructions (Addendum)
Stop benadryl. Start in its place claritin 10mg  daily for allergies.  Talk with Dr Lorelei Pont about flexeril, amitriptyline and klonopin.  Continue ice to wrist and knee and face, but not directly on the skin.  Use wrist brace provided today.  Left knee xray today.  We will watch facial pain - if not improving, let me know for CT scan.

## 2020-02-20 DIAGNOSIS — S0993XA Unspecified injury of face, initial encounter: Secondary | ICD-10-CM | POA: Insufficient documentation

## 2020-02-20 DIAGNOSIS — S8992XA Unspecified injury of left lower leg, initial encounter: Secondary | ICD-10-CM | POA: Insufficient documentation

## 2020-02-20 DIAGNOSIS — R296 Repeated falls: Secondary | ICD-10-CM | POA: Insufficient documentation

## 2020-02-20 DIAGNOSIS — S6991XA Unspecified injury of right wrist, hand and finger(s), initial encounter: Secondary | ICD-10-CM | POA: Insufficient documentation

## 2020-02-20 NOTE — Assessment & Plan Note (Signed)
Possible fracture vs bony contusion at R maxilla. Reassuring story/exam. Continue ice use. Discussed update if not improving with this to consider maxillofacial CT for further evaluation. EOMI without pain.

## 2020-02-20 NOTE — Assessment & Plan Note (Signed)
Anticipate R wrist sprain - placed in thumb spica brace for support.

## 2020-02-20 NOTE — Assessment & Plan Note (Addendum)
Check L knee xray for further evaluation of bruising overlying patella after direct fall - clear on my read besides know wear and tear arthritis. Continue leg elevation as well as ice.

## 2020-02-20 NOTE — Assessment & Plan Note (Addendum)
Denies significant premonitory symptoms. Negative orthostatics today.  Suggested minimizing anticholinergic/sedating medications. Will start by changing benadryl (she's taking BID) to claritin daily. I also suggested she take flexeril 1/2 tab when used, or discontinue. To discuss amitriptyline (sedating) and klonopin use with PCP. Pt agrees with plan.

## 2020-03-06 DIAGNOSIS — E7800 Pure hypercholesterolemia, unspecified: Secondary | ICD-10-CM | POA: Diagnosis not present

## 2020-03-06 DIAGNOSIS — Z1159 Encounter for screening for other viral diseases: Secondary | ICD-10-CM | POA: Diagnosis not present

## 2020-03-06 DIAGNOSIS — R011 Cardiac murmur, unspecified: Secondary | ICD-10-CM | POA: Diagnosis not present

## 2020-03-06 DIAGNOSIS — E118 Type 2 diabetes mellitus with unspecified complications: Secondary | ICD-10-CM | POA: Diagnosis not present

## 2020-03-12 ENCOUNTER — Telehealth: Payer: Self-pay | Admitting: Family Medicine

## 2020-03-12 DIAGNOSIS — E7800 Pure hypercholesterolemia, unspecified: Secondary | ICD-10-CM | POA: Diagnosis not present

## 2020-03-12 DIAGNOSIS — Z136 Encounter for screening for cardiovascular disorders: Secondary | ICD-10-CM | POA: Diagnosis not present

## 2020-03-12 DIAGNOSIS — E118 Type 2 diabetes mellitus with unspecified complications: Secondary | ICD-10-CM | POA: Diagnosis not present

## 2020-03-12 DIAGNOSIS — Z8249 Family history of ischemic heart disease and other diseases of the circulatory system: Secondary | ICD-10-CM | POA: Diagnosis not present

## 2020-03-12 DIAGNOSIS — R011 Cardiac murmur, unspecified: Secondary | ICD-10-CM | POA: Diagnosis not present

## 2020-03-12 NOTE — Progress Notes (Signed)
  Chronic Care Management   Note  03/12/2020 Name: Amanda [name] MRN: [id number] DOB: [birth year]  Amanda [name] is a 69 y.o. year old female who is a primary care patient of Copland, Frederico Hamman, MD. I reached out to [name] by phone today in response to a referral sent by [name]'s PCP, Owens Loffler, MD.   [name] including:  1. CCM service includes personalized support from designated clinical staff supervised by her physician, including individualized plan of care and coordination with other care providers 2. 24/7 contact phone numbers for assistance for urgent and routine care needs. 3. Service will only be billed when office clinical staff spend 20 minutes or more in a month to coordinate care. 4. Only one practitioner may furnish and bill the service in a calendar month. 5. The patient may stop CCM services at any time (effective at the end of the month) by phone call to the office staff.   Patient agreed to services and verbal consent obtained.   Follow up plan:   Raynicia Dukes [employer] Scheduler

## 2020-04-07 ENCOUNTER — Telehealth: Payer: Self-pay

## 2020-04-07 DIAGNOSIS — J42 Unspecified chronic bronchitis: Secondary | ICD-10-CM

## 2020-04-07 DIAGNOSIS — I10 Essential (primary) hypertension: Secondary | ICD-10-CM

## 2020-04-07 NOTE — Telephone Encounter (Signed)
Per written referral from PCP, requesting referral in Epic for Amanda [name] to chronic care management pharmacy services for the following conditions:   Essential hypertension, benign  [I10]  COPD [J44.9]  Debbora Dus, PharmD Clinical Pharmacist [facility] at [facility] [phone]

## 2020-04-08 ENCOUNTER — Ambulatory Visit: Payer: Medicare Other

## 2020-04-08 ENCOUNTER — Other Ambulatory Visit: Payer: Self-pay

## 2020-04-08 ENCOUNTER — Telehealth: Payer: Self-pay

## 2020-04-08 DIAGNOSIS — F17200 Nicotine dependence, unspecified, uncomplicated: Secondary | ICD-10-CM

## 2020-04-08 DIAGNOSIS — J42 Unspecified chronic bronchitis: Secondary | ICD-10-CM

## 2020-04-08 DIAGNOSIS — K219 Gastro-esophageal reflux disease without esophagitis: Secondary | ICD-10-CM

## 2020-04-08 DIAGNOSIS — E1121 Type 2 diabetes mellitus with diabetic nephropathy: Secondary | ICD-10-CM

## 2020-04-08 NOTE — Telephone Encounter (Signed)
Pt would like to schedule an appt with PCP for hand pain. Would you mind calling to schedule?  Thanks!  Debbora Dus, PharmD Clinical Pharmacist [place] [facility] at [facility] [phone]

## 2020-04-08 NOTE — Chronic Care Management (AMB) (Signed)
Chronic Care Management Pharmacy  Name: Amanda [name]  MRN: [id number] DOB: [birth year]  Chief Complaint/ HPI  Amanda [name],  69 y.o., female presents for their Initial CCM visit with the clinical pharmacist via telephone.  PCP : Amanda Loffler, MD  Their chronic conditions include: hypertension, hyperlipidemia, COPD, depression, tobacco use, type 2 diabetes, GERD, osteoarthritis  Patient concerns: denies med concerns; reports pain has worsened in right hand, needs to see PCP,  (have scheduler call for appt)  Office Visits:  02/19/20: Amanda [name] - recurrent falls; Suggested minimizing anticholinergic/sedating medications. Will start by changing benadryl (she's taking BID) to claritin daily. I also suggested she take flexeril 1/2 tab when used, or discontinue. To discuss amitriptyline (sedating) and klonopin use with PCP. Pt agrees with plan.   12/21/19: Amanda [name] - trigger finger (left hand), depo-medrol given  11/06/19: Amanda [name] - orthostatic hypotension leading to fall - move slowly upon standing  Consult Visit:  10/17/19: Cardiology - CAD, no angina, treat risk factors, LDL controlled, tobacco cessation   10/2019 ED - Fall  Allergies  Allergen Reactions  . Diazepam Other (See Comments)    REACTION: pt states she gets very angry and abusive verbally on this medication    Medications: Outpatient Encounter Medications as of 04/08/2020  Medication Sig Note  . acetaminophen (TYLENOL) 650 MG CR tablet Take 650 mg by mouth 3 times/day as needed-between meals & bedtime (for headaches).   Marland Kitchen albuterol (PROVENTIL HFA;VENTOLIN HFA) 108 (90 Base) MCG/ACT inhaler Inhale 2 puffs into the lungs every 4 (four) hours as needed for wheezing or shortness of breath.   Marland Kitchen amitriptyline (ELAVIL) 50 MG tablet Take 2 tablets by mouth daily at bedtime.   . Ascorbic Acid (VITAMIN C) 1000 MG tablet Take 1,000 mg by mouth daily.   Marland Kitchen aspirin EC 81 MG tablet Take 81 mg by mouth daily.   Marland Kitchen buPROPion  (WELLBUTRIN XL) 150 MG 24 hr tablet Take 1 tablet (150 mg total) by mouth daily.   . Cholecalciferol (VITAMIN D) 1000 UNITS capsule Take 1,000 Units by mouth daily.    . clonazePAM (KLONOPIN) 0.5 MG tablet Take 1 tablet (0.5 mg total) by mouth 2 (two) times daily as needed. (Patient taking differently: Take 0.5 mg by mouth 2 (two) times daily as needed. Taking 1/2 tablet BID PRN)   . cyclobenzaprine (FLEXERIL) 10 MG tablet Take 1 tablet (10 mg total) by mouth 3 (three) times daily as needed. for muscle spams   . FLUoxetine (PROZAC) 40 MG capsule Take 1 capsule (40 mg total) by mouth daily.   Marland Kitchen guaiFENesin (MUCINEX) 600 MG 12 hr tablet Take 600 mg by mouth 2 (two) times daily as needed (congestion).   Marland Kitchen loratadine (CLARITIN) 10 MG tablet Take 1 tablet (10 mg total) by mouth daily.   . magnesium oxide (MAG-OX) 400 MG tablet Take 400 mg by mouth daily.   . metoprolol tartrate (LOPRESSOR) 25 MG tablet Take 1/2 (one-half) tablet by mouth twice daily   . Multiple Vitamin (MULTIVITAMIN) tablet Take 1 tablet by mouth daily.   . naproxen sodium (ALEVE) 220 MG tablet Take 220 mg by mouth daily. 10/17/2018: Scheduled   . pantoprazole (PROTONIX) 40 MG tablet TAKE ONE TABLET BY MOUTH 30 MINUTES PRIOR TO BREAKFAST   . polycarbophil (EQ FIBER THERAPY) 625 MG tablet Take 625 mg by mouth daily. FIBER THERAPY   . rosuvastatin (CRESTOR) 40 MG tablet Take 1 tablet (40 mg total) by mouth daily.   . Simethicone 125 MG  CAPS Take 125 mg by mouth daily as needed (gas).   . calcium carbonate (TUMS - DOSED IN MG ELEMENTAL CALCIUM) 500 MG chewable tablet Chew 2 tablets by mouth daily as needed for indigestion or heartburn.    No facility-administered encounter medications on file as of 04/08/2020.   Current Diagnosis/Assessment:   Emergency planning/management officer Strain: Low Risk   . Difficulty of Paying Living Expenses: Not very hard   Goals Addressed            This Visit's Progress   . Pharmacy Care Plan       CARE PLAN  ENTRY  Current Barriers:  . Chronic Disease Management support, education, and care coordination needs related to Diabetes, Gastroesophageal Reflux Disease, COPD, and Depression   GERD/Acid Reflux . Pharmacist Clinical Goal(s): o Over the next 6 months, patient will work with PharmD and providers to: reduce difficulty swallowing foods . Current regimen:  o Pantoprazole 40 mg - 1 tablet daily before breakfast . Interventions: o Recommend adjusting timing of medication . Patient self care activities - Over the next 6 months, patient will: o Try moving pantoprazole up 30 minutes before breakfast and other medications; If no change in symptoms may resume usual dosing.   Diabetes . Pharmacist Clinical Goal(s): o Over the next 6 months, patient will work with PharmD and providers to maintain A1c goal < 7% . Current regimen:  o No pharmacotherapy, maintained with diet and exercise . Interventions: o Encourage heart healthy diet and exercise goals - Exercise as tolerated, goal of 10 minutes 5 days per week - Limit snacks; avoid sugary beverages - Diet high in lean meats, vegetables, nuts, legumes,  . Patient self care activities - Over the next 6 months, patient will: o Check blood sugar if symptomatic of low (shaky, sweaty, lightheaded) or high blood sugar (frequent urination, blurred vision) o Contact provider with any episodes of hypoglycemia o Work towards personal weight loss goals  Depression/Anxiety . Pharmacist Clinical Goal(s) o Over the next 6 months, patient will work with PharmD and providers to reduce risk of falls and cognitive impairment by limiting medication use . Current regimen:  o Fluoxetine 40 mg - 1 capsule daily o Bupropion 150 mg - 1 tablet daily  o Clonazepam 0.5 mg - 1/2 tablet twice daily PRN . Interventions: o Discussed fall risk with clonazepam o Recommend moving afternoon dose of clonazepam to nighttime. Then, if tolerated, try reducing to 1/2 tablet at  bedtime only.  . Patient self care activities - Over the next 6 months, patient will: o Attempt to reduce dose of clonazepam to nighttime only.  COPD . Pharmacist Clinical Goal(s) o Over the next 6 months, patient will work with PharmD and providers to reduce shortness of breath and achieve smoking cessation . Current regimen:   Albuterol 90 mcg - 2 puffs every 4 hours as needed (current use: once weekly)  Current tobacco use: 1/2 pack of cigarettes per day . Interventions: o Encouraged and support smoking cessation . Patient self care activities - Over the next 6 months, patient will: o Work towards reducing cigarette use with goal of cessation o Please call if you would like to set a quit date and would like to discuss assistance options including medication or counseling   Additional recommendations:  . Remain up to date on vaccinations; Recommend 2-dose series of Shingrix Vaccine  . Please call if interested in medication delivery and coordination services [phone])  Initial goal documentation  Hypertension   CMP Latest Ref Rng & Units 10/24/2019 05/31/2019 12/15/2018  Glucose 70 - 99 mg/dL 179(H) 165(H) -  BUN 8 - 23 mg/dL 12 9 -  Creatinine 0.44 - 1.00 mg/dL 0.90 0.83 -  Sodium 135 - 145 mmol/L 136 139 -  Potassium 3.5 - 5.1 mmol/L 3.9 4.0 -  Chloride 98 - 111 mmol/L 106 105 -  CO2 22 - 32 mmol/L 23 29 -  Calcium 8.9 - 10.3 mg/dL 8.8(L) 8.8 -  Total Protein 6.5 - 8.1 g/dL 6.9 - 6.7  Total Bilirubin 0.3 - 1.2 mg/dL 0.8 - 0.6  Alkaline Phos 38 - 126 U/L 40 - 48  AST 15 - 41 U/L 21 - 26  ALT 0 - 44 U/L 16 - 22   Office blood pressures are: BP Readings from Last 3 Encounters:  02/19/20 136/76  12/21/19 130/90  11/06/19 120/64   BP goal < 140/90 mmHg Patient has failed these meds in the past: none Patient checks BP at home: occasionally  Patient home BP readings are ranging: none reported  Patient is currently controlled on the following medications:    Metoprolol tartrate 25 mg - 1/2 tablet BID  Plan: Continue current medications  Hyperlipidemia   Lipid Panel     Component Value Date/Time   CHOL 148 12/15/2018 1302   CHOL 201 (H) 09/18/2013 1016   TRIG 97 12/15/2018 1302   HDL 64 12/15/2018 1302   HDL 53 09/18/2013 1016   CHOLHDL 2.3 12/15/2018 1302   VLDL 19 12/15/2018 1302   LDLCALC 65 12/15/2018 1302   LDLCALC 130 (H) 09/18/2013 1016   LDLDIRECT 209.2 05/28/2009 0909   LABVLDL 18 09/18/2013 1016     CBC Latest Ref Rng & Units 10/24/2019 05/31/2019 10/18/2018  WBC 4.0 - 10.5 K/uL 13.7(H) 5.1 6.4  Hemoglobin 12.0 - 15.0 g/dL 12.4 12.7 13.1  Hematocrit 36.0 - 46.0 % 37.1 38.1 41.5  Platelets 150 - 400 K/uL 164 177.0 203   LDL goal < 70 (CAD) Patient has failed these meds in past: none Patient is currently controlled on the following medications:   Rosuvastatin 40 mg - 1 tablet daily  Aspirin 81 mg - 1 tablet daily  We discussed: CBC stable; currently out of rosuvastatin (some issues with coordination of mail order pharmacy)  Plan: Continue current medications  COPD/ Tobacco   Last spirometry score: 07/2018; FEV1 % predicted  - 85% Gold Grade: Gold 1 (FEV1>80%)  Eosinophil count:   Lab Results  Component Value Date/Time   EOSPCT 0 10/24/2019 03:57 AM  %                               Eos (Absolute):  Lab Results  Component Value Date/Time   EOSABS 0.0 10/24/2019 03:57 AM   Tobacco Status:  Social History   Tobacco Use  Smoking Status Current Every Day Smoker  . Packs/day: 0.75  . Years: 45.00  . Pack years: 33.75  . Types: Cigarettes  Smokeless Tobacco Never Used  Tobacco Comment   trying to quit before knee surgery   Patient has failed these meds in past: none reported Patient is currently controlled on the following medications:   Albuterol 90 mcg - 2 puffs every 4 hours PRN  Using maintenance inhaler regularly? None prescribed Frequency of rescue inhaler use: once per week   We  discussed: trying to cut back on cigarette use - current use 1/2  PPD; albuterol - mainly uses when its humid/allergies - about once per week  Plan: Continue current medications; Continue progress towards smoking cessation.   Diabetes   Recent Relevant Labs: Lab Results  Component Value Date/Time   HGBA1C 6.5 05/31/2019 08:13 AM   HGBA1C 5.9 (H) 10/18/2018 10:33 AM   MICROALBUR <0.7 05/31/2019 08:13 AM   MICROALBUR <0.7 06/02/2017 01:27 PM    A1c goal < 7% Checking BG: Rarely (if symptomatic)  Patient has failed these meds in past: none reported Patient is currently controlled on the following medications:   No pharmacotherapy    We discussed: limits dietary intake of sugar/carbs; has gained some weight in the past year; currently working on reducing snacking    Plan: Continue current medications; Continue to eat a heart healthy diet low in carbohydrates.  Depression    Patient has failed these meds in past: none reported Patient is currently controlled on the following medications:   Fluoxetine 40 mg - 1 capsule daily  Bupropion 150 mg - 1 tablet daily (started after dose reduction in fluoxetine)  Clonazepam 0.5 mg - 1 tablet BID PRN  We discussed: previously on fluoxetine 40 mg BID, reduced to 1 tablet due to brain fog; discussed clonazepam and fall risk - pt reports falls are due to moving too quickly/loss of balance and knows she needs to be more careful; reports taking clonazepam1/2 tablet AM and late afternoon; recommended moving one dose to nighttime to reduce daytime drowsiness  Plan: Continue current medications; Try moving afternoon clonazepam to nighttime. If tolerated, try reducing to 1/2 tablet at bedtime only.   Osteoarthritis right knee   Patient has failed these meds in past: none reported Patient is currently controlled on the following medications:   Tylenol Arthritis 650 mg - 1 tablet TID PRN (if pain unresolved - usually 1 at dinnertime)  Naproxen  220 mg - 1 tablet daily (every AM)  Cyclobenzaprine 10 mg - 1 tablet TID PRN   Amitriptyline 50 mg - 2 tablets qhs (nerve pain)  We discussed: reports amitriptyline helps a lot with nerve pain in back  Plan: Continue current medications  GERD   Patient has failed these meds in past: none  Patient is currently controlled on the following medications:   Pantoprazole 40 mg - 1 tablet daily before breakfast  We discussed: reports working well, takes with morning meds; occasionally some food gets stuck in throat--> uses chewable for heartburn/gas (Equate brand) 750 mg calcium carbonate/80 mg simethicone  Plan: Continue current medications; Try moving pantoprazole to 30 minutes before breakfast to see if symptoms improve.   Vaccines   Reviewed and discussed patient's vaccination history.    Immunization History  Administered Date(s) Administered  . Influenza Split 09/09/2011, 10/10/2012  . Influenza Whole 09/20/2006  . Influenza, High Dose Seasonal PF 08/15/2019  . Influenza,inj,Quad PF,6+ Mos 09/18/2015, 09/16/2016, 09/13/2017  . PPD Test 12/27/2015  . Pneumococcal Conjugate-13 09/18/2015  . Pneumococcal Polysaccharide-23 11/23/2016  . Td 12/26/1998, 09/13/2017   Plan: Recommended patient receive Shingrix  Medication Management  OTCs: simethicone 125 mg - PRN, Fiber 625 mg daily, naproxen 220 mg - daily, multivitamin, loratadine 10 mg daily, Mucinex 600 mg - BID PRN, magnesium oxide 400 mg - daily for muscle cramps, vitamin C 1000 mg - daily, vitamin D 1000 IU daily (WNL - 2018)  Pharmacy/Benefits: [employer] Part [facility]/Mail Order   Adherence: uses pillbox   Affordability: denies concerns  CCM Follow Up: 6 months (telephone)  Debbora Dus, PharmD  Clinical Pharmacist [facility] [phone]

## 2020-04-08 NOTE — Patient Instructions (Signed)
Apr 08, 2020  Dear Amanda [name],  It was a pleasure meeting you during our initial appointment on Apr 08, 2020. Below is a summary of the goals we discussed and components of chronic care management. Please contact me anytime with questions or concerns.   Visit Information  Goals Addressed            This Visit's Progress    Pharmacy Care Plan       CARE PLAN ENTRY  Current Barriers:   Chronic Disease Management support, education, and care coordination needs related to Diabetes, Gastroesophageal Reflux Disease, COPD, and Depression   GERD/Acid Reflux  Pharmacist Clinical Goal(s): o Over the next 6 months, patient will work with PharmD and providers to: reduce difficulty swallowing foods  Current regimen:  o Pantoprazole 40 mg - 1 tablet daily before breakfast  Interventions: o Recommend adjusting timing of medication  Patient self care activities - Over the next 6 months, patient will: o Try moving pantoprazole up 30 minutes before breakfast and other medications; If no change in symptoms may resume usual dosing.   Diabetes  Pharmacist Clinical Goal(s): o Over the next 6 months, patient will work with PharmD and providers to maintain A1c goal < 7%  Current regimen:  o No pharmacotherapy, maintained with diet and exercise  Interventions: o Encourage heart healthy diet and exercise goals - Exercise as tolerated, goal of 10 minutes 5 days per week - Limit snacks; avoid sugary beverages - Diet high in lean meats, vegetables, nuts, legumes,   Patient self care activities - Over the next 6 months, patient will: o Check blood sugar if symptomatic of low (shaky, sweaty, lightheaded) or high blood sugar (frequent urination, blurred vision) o Contact provider with any episodes of hypoglycemia o Work towards personal weight loss goals  Depression/Anxiety  Pharmacist Clinical Goal(s) o Over the next 6 months, patient will work with PharmD and providers to reduce risk  of falls and cognitive impairment by limiting medication use  Current regimen:  o Fluoxetine 40 mg - 1 capsule daily o Bupropion 150 mg - 1 tablet daily  o Clonazepam 0.5 mg - 1/2 tablet twice daily PRN  Interventions: o Discussed fall risk with clonazepam o Recommend moving afternoon dose of clonazepam to nighttime. Then, if tolerated, try reducing to 1/2 tablet at bedtime only.   Patient self care activities - Over the next 6 months, patient will: o Attempt to reduce dose of clonazepam to nighttime only.  COPD  Pharmacist Clinical Goal(s) o Over the next 6 months, patient will work with PharmD and providers to reduce shortness of breath and achieve smoking cessation  Current regimen:   Albuterol 90 mcg - 2 puffs every 4 hours as needed (current use: once weekly)  Current tobacco use: 1/2 pack of cigarettes per day  Interventions: o Encouraged and support smoking cessation  Patient self care activities - Over the next 6 months, patient will: o Work towards reducing cigarette use with goal of cessation o Please call if you would like to set a quit date and would like to discuss assistance options including medication or counseling   Additional recommendations:   Remain up to date on vaccinations; Recommend 2-dose series of Shingrix Vaccine   Please call if interested in medication delivery and coordination services [phone])  Initial goal documentation      Amanda [name] was given information about Chronic Care Management services today including:  1. CCM service includes personalized support from designated clinical staff  supervised by her physician, including individualized plan of care and coordination with other care providers 2. 24/7 contact phone numbers for assistance for urgent and routine care needs. 3. Standard insurance, coinsurance, copays and deductibles apply for chronic care management only during months in which we provide at least 20 minutes of these  services. Most insurances cover these services at 100%, however patients may be responsible for any copay, coinsurance and/or deductible if applicable. This service may help you avoid the need for more expensive face-to-face services. 4. Only one practitioner may furnish and bill the service in a calendar month. 5. The patient may stop CCM services at any time (effective at the end of the month) by phone call to the office staff.  Patient agreed to services and verbal consent obtained.   The patient verbalized understanding of instructions provided today and agreed to receive a mailed copy of patient instruction and/or educational materials. Telephone follow up appointment with pharmacy team member scheduled for: 10/24/20 at 11:00 AM (telephone call)  Debbora Dus, PharmD Clinical Pharmacist [facility] [phone]   Diabetes Mellitus and Nutrition, Adult When you have diabetes (diabetes mellitus), it is very important to have healthy eating habits because your blood sugar (glucose) levels are greatly affected by what you eat and drink. Eating healthy foods in the appropriate amounts, at about the same times every day, can help you:  Control your blood glucose.  Lower your risk of heart disease.  Improve your blood pressure.  Reach or maintain a healthy weight. Every person with diabetes is different, and each person has different needs for a meal plan. Your health care provider may recommend that you work with a diet and nutrition specialist (dietitian) to make a meal plan that is best for you. Your meal plan may vary depending on factors such as:  The calories you need.  The medicines you take.  Your weight.  Your blood glucose, blood pressure, and cholesterol levels.  Your activity level.  Other health conditions you have, such as heart or kidney disease. How do carbohydrates affect me? Carbohydrates, also called carbs, affect your blood glucose  level more than any other type of food. Eating carbs naturally raises the amount of glucose in your blood. Carb counting is a method for keeping track of how many carbs you eat. Counting carbs is important to keep your blood glucose at a healthy level, especially if you use insulin or take certain oral diabetes medicines. It is important to know how many carbs you can safely have in each meal. This is different for every person. Your dietitian can help you calculate how many carbs you should have at each meal and for each snack. Foods that contain carbs include:  Bread, cereal, rice, pasta, and crackers.  Potatoes and corn.  Peas, beans, and lentils.  Milk and yogurt.  Fruit and juice.  Desserts, such as cakes, cookies, ice cream, and candy. How does alcohol affect me? Alcohol can cause a sudden decrease in blood glucose (hypoglycemia), especially if you use insulin or take certain oral diabetes medicines. Hypoglycemia can be a life-threatening condition. Symptoms of hypoglycemia (sleepiness, dizziness, and confusion) are similar to symptoms of having too much alcohol. If your health care provider says that alcohol is safe for you, follow these guidelines:  Limit alcohol intake to no more than 1 drink per day for nonpregnant women and 2 drinks per day for men. One drink equals 12 oz of beer, 5 oz of  wine, or 1 oz of hard liquor.  Do not drink on an empty stomach.  Keep yourself hydrated with water, diet soda, or unsweetened iced tea.  Keep in mind that regular soda, juice, and other mixers may contain a lot of sugar and must be counted as carbs. What are tips for following this plan?  Reading food labels  Start by checking the serving size on the "Nutrition Facts" label of packaged foods and drinks. The amount of calories, carbs, fats, and other nutrients listed on the label is based on one serving of the item. Many items contain more than one serving per package.  Check the total  grams (g) of carbs in one serving. You can calculate the number of servings of carbs in one serving by dividing the total carbs by 15. For example, if a food has 30 g of total carbs, it would be equal to 2 servings of carbs.  Check the number of grams (g) of saturated and trans fats in one serving. Choose foods that have low or no amount of these fats.  Check the number of milligrams (mg) of salt (sodium) in one serving. Most people should limit total sodium intake to less than 2,300 mg per day.  Always check the nutrition information of foods labeled as "low-fat" or "nonfat". These foods may be higher in added sugar or refined carbs and should be avoided.  Talk to your dietitian to identify your daily goals for nutrients listed on the label. Shopping  Avoid buying canned, premade, or processed foods. These foods tend to be high in fat, sodium, and added sugar.  Shop around the outside edge of the grocery store. This includes fresh fruits and vegetables, bulk grains, fresh meats, and fresh dairy. Cooking  Use low-heat cooking methods, such as baking, instead of high-heat cooking methods like deep frying.  Cook using healthy oils, such as olive, canola, or sunflower oil.  Avoid cooking with butter, cream, or high-fat meats. Meal planning  Eat meals and snacks regularly, preferably at the same times every day. Avoid going long periods of time without eating.  Eat foods high in fiber, such as fresh fruits, vegetables, beans, and whole grains. Talk to your dietitian about how many servings of carbs you can eat at each meal.  Eat 4-6 ounces (oz) of lean protein each day, such as lean meat, chicken, fish, eggs, or tofu. One oz of lean protein is equal to: ? 1 oz of meat, chicken, or fish. ? 1 egg. ?  cup of tofu.  Eat some foods each day that contain healthy fats, such as avocado, nuts, seeds, and fish. Lifestyle  Check your blood glucose regularly.  Exercise regularly as told by your  health care provider. This may include: ? 150 minutes of moderate-intensity or vigorous-intensity exercise each week. This could be brisk walking, biking, or water aerobics. ? Stretching and doing strength exercises, such as yoga or weightlifting, at least 2 times a week.  Take medicines as told by your health care provider.  Do not use any products that contain nicotine or tobacco, such as cigarettes and e-cigarettes. If you need help quitting, ask your health care provider.  Work with a Social worker or diabetes educator to identify strategies to manage stress and any emotional and social challenges. Questions to ask a health care provider  Do I need to meet with a diabetes educator?  Do I need to meet with a dietitian?  What number can I call if I have  questions?  When are the best times to check my blood glucose? Where to find more information:  American Diabetes Association: diabetes.org  Academy of Nutrition and Dietetics: www.eatright.[employer] of Diabetes and Digestive and Kidney Diseases (NIH): [employer] Summary  A healthy meal plan will help you control your blood glucose and maintain a healthy lifestyle.  Working with a diet and nutrition specialist (dietitian) can help you make a meal plan that is best for you.  Keep in mind that carbohydrates (carbs) and alcohol have immediate effects on your blood glucose levels. It is important to count carbs and to use alcohol carefully. This information is not intended to replace advice given to you by your health care provider. Make sure you discuss any questions you have with your health care provider. Document Revised: 11/05/2017 Document Reviewed: 12/28/2016 Elsevier Patient Education  2020 [employer].

## 2020-04-08 NOTE — Telephone Encounter (Signed)
Please sign referral

## 2020-04-17 ENCOUNTER — Encounter: Payer: Self-pay | Admitting: Family Medicine

## 2020-04-17 ENCOUNTER — Ambulatory Visit (INDEPENDENT_AMBULATORY_CARE_PROVIDER_SITE_OTHER)
Admission: RE | Admit: 2020-04-17 | Discharge: 2020-04-17 | Disposition: A | Discharge status: Home / Self Care | Payer: Medicare Other | Source: Ambulatory Visit | Attending: Family Medicine | Admitting: Family Medicine

## 2020-04-17 ENCOUNTER — Other Ambulatory Visit: Payer: Self-pay

## 2020-04-17 ENCOUNTER — Ambulatory Visit (INDEPENDENT_AMBULATORY_CARE_PROVIDER_SITE_OTHER): Payer: Medicare Other | Admitting: Family Medicine

## 2020-04-17 VITALS — BP 140/84 | HR 81 | Temp 98.2°F | Ht 66.0 in | Wt 229.5 lb

## 2020-04-17 DIAGNOSIS — M19042 Primary osteoarthritis, left hand: Secondary | ICD-10-CM

## 2020-04-17 DIAGNOSIS — M19041 Primary osteoarthritis, right hand: Secondary | ICD-10-CM

## 2020-04-17 DIAGNOSIS — M180 Bilateral primary osteoarthritis of first carpometacarpal joints: Secondary | ICD-10-CM | POA: Diagnosis not present

## 2020-04-17 DIAGNOSIS — S6991XA Unspecified injury of right wrist, hand and finger(s), initial encounter: Secondary | ICD-10-CM

## 2020-04-17 DIAGNOSIS — M25531 Pain in right wrist: Secondary | ICD-10-CM | POA: Diagnosis not present

## 2020-04-17 DIAGNOSIS — R29898 Other symptoms and signs involving the musculoskeletal system: Secondary | ICD-10-CM

## 2020-04-17 DIAGNOSIS — M67431 Ganglion, right wrist: Secondary | ICD-10-CM | POA: Diagnosis not present

## 2020-04-17 NOTE — Progress Notes (Signed)
Amanda [name] T. Kim Oki, MD, [place] at [facility] [place], [address]  Phone: [phone]  FAX: [phone]  [name] - 69 y.o. female  MRN [id number]  Date of Birth: [birth year]  Date: 04/17/2020  PCP: Owens Loffler, MD  Referral: Owens Loffler, MD  Chief Complaint  Patient presents with  . Hand Pain    Bilateral  . Fall    02/2020  . Facial Pain    This visit occurred during the SARS-CoV-2 public health emergency.  Safety protocols were in place, including screening questions prior to the visit, additional usage of staff PPE, and extensive cleaning of exam room while observing appropriate contact time as indicated for disinfecting solutions.   Subjective:   Amanda [name] is a 69 y.o. very pleasant female patient with Body mass index is 37.04 kg/m. who presents with the following:  Hand arthritis  The patient fell over 2 months ago, and at that point she saw my partner Dr. Danise Mina.  She did have a plain knee film.  She did have a total knee replacement on the size and showed no abnormality.  Her knee today feels fine.  She primarily complains of right-sided wrist pain with some decreased range of motion in her wrist compared to the prior state of her wrist approximately 6 months ago.  She does have diffuse hand osteoarthritis that is well known in pain and multiple joints throughout the hand and wrist that is been present for some time.  She has lost motion in the right wrist compared to the left wrist.  She also struck her face in March as well and she has some inferior orbital pain and some bruising that completely resolved.  Review of Systems is noted in the HPI, as appropriate   Objective:   BP 140/84   Pulse 81   Temp 98.2 F (36.8 C) (Temporal)   Ht 5\' 6"  (1.676 m)   Wt 229 lb 8 oz (104.1 kg)   SpO2 98%   BMI 37.04 kg/m   GEN: No acute  distress; alert,appropriate. PULM: Breathing comfortably in no respiratory distress PSYCH: Normally interactive.    Her neck moves easily in all directions.  She does have some pain in the infraorbital region beneath her right eye, but her extraocular movements are intact and normal pupillary reflexes.  Bilateral elbows move completely without any limitation as well as bilateral shoulders.  Bilaterally her grip strength is notably diminished and some of her hand musculature is wasting.  She has notable arthritis at the [facility] joints bilaterally in the first as well as notable arthritic changes in virtually all MCP, PIP, and DIP joints.  She is nontender throughout the distal radius and ulna and the length of the ulna and radius in its entirety.  She does have a loss of approximately 20 degrees of motion compared to the contralateral side.  She does have some pain with terminal flexion as well as extension.  Radiology: DG Wrist Complete Right  Result Date: 04/17/2020 CLINICAL DATA:  Pain following recent fall EXAM: RIGHT WRIST - COMPLETE 3+ VIEW COMPARISON:  None. FINDINGS: Frontal, oblique, lateral, and ulnar deviation scaphoid images were obtained. There is a subtly sclerotic area at the junction of the mid and distal thirds of the scaphoid which potentially could represent a subtle impaction type injury. No other findings suggesting potential fracture. No dislocation. There is narrowing in the radiocarpal  joint. There is also mild narrowing in the first carpal-metacarpal joint region. No erosive change. IMPRESSION: Subtle linear sclerotic area at the junction of the mid and distal thirds of the scaphoid, a potential subtle impaction injury. If patient is focally tender in this area, this finding would warrant immobilization with consideration for MR to assess for marrow edema in this area. No other findings suggesting potential fracture. No dislocation. Areas of osteoarthritic change noted. These  results will be called to the ordering clinician or representative by the Radiologist Assistant, and communication documented in the PACS or [employer]. Electronically Signed   By: Lowella Grip III M.D.   On: 04/17/2020 17:33     Assessment and Plan:     ICD-10-CM   1. Right wrist injury, initial encounter  S69.91XA DG Wrist Complete Right  2. Primary osteoarthritis of both hands  M19.041    M19.042   3. Osteoarthritis of carpometacarpal (CMC) joints of both thumbs, unspecified osteoarthritis type  M18.0   4. Decreased grip strength  R29.898    She clearly has arthritis and some diminished grip strength.  On wrist x-ray, on secondary review the patient does seem to have a sclerotic line in her scaphoid on the right.  There is no injury other than the fall 2 months ago.  She does have some limitation in her motion and pain with motion.  This could represent a prior fracture from her fall in March.  After office was closed, the patient was alerted and requested that she go to orthopedic urgent care for further evaluation.  Those notes are not available for my review.  I will be contacting the patient today to discuss further with her.  Follow-up: No follow-ups on file.  No orders of the defined types were placed in this encounter.  There are no discontinued medications. Orders Placed This Encounter  Procedures  . DG Wrist Complete Right    Signed,  Jarris Kortz T. Aulani Shipton, MD   Outpatient Encounter Medications as of 04/17/2020  Medication Sig  . acetaminophen (TYLENOL) 650 MG CR tablet Take 650 mg by mouth 3 times/day as needed-between meals & bedtime (for headaches).  Marland Kitchen albuterol (PROVENTIL HFA;VENTOLIN HFA) 108 (90 Base) MCG/ACT inhaler Inhale 2 puffs into the lungs every 4 (four) hours as needed for wheezing or shortness of breath.  Marland Kitchen amitriptyline (ELAVIL) 50 MG tablet Take 2 tablets by mouth daily at bedtime.  . Ascorbic Acid (VITAMIN C) 1000 MG tablet Take 1,000 mg by  mouth daily.  Marland Kitchen aspirin EC 81 MG tablet Take 81 mg by mouth daily.  Marland Kitchen buPROPion (WELLBUTRIN XL) 150 MG 24 hr tablet Take 1 tablet (150 mg total) by mouth daily.  . calcium carbonate (TUMS - DOSED IN MG ELEMENTAL CALCIUM) 500 MG chewable tablet Chew 2 tablets by mouth daily as needed for indigestion or heartburn.  . Cholecalciferol (VITAMIN D) 1000 UNITS capsule Take 1,000 Units by mouth daily.   . clonazePAM (KLONOPIN) 0.5 MG tablet Take 1 tablet (0.5 mg total) by mouth 2 (two) times daily as needed. (Patient taking differently: Take 0.5 mg by mouth 2 (two) times daily as needed. Taking 1/2 tablet BID PRN)  . cyclobenzaprine (FLEXERIL) 10 MG tablet Take 1 tablet (10 mg total) by mouth 3 (three) times daily as needed. for muscle spams  . FLUoxetine (PROZAC) 40 MG capsule Take 1 capsule (40 mg total) by mouth daily.  Marland Kitchen guaiFENesin (MUCINEX) 600 MG 12 hr tablet Take 600 mg by mouth 2 (two)  times daily as needed (congestion).  Marland Kitchen loratadine (CLARITIN) 10 MG tablet Take 1 tablet (10 mg total) by mouth daily.  . magnesium oxide (MAG-OX) 400 MG tablet Take 400 mg by mouth daily.  . metoprolol tartrate (LOPRESSOR) 25 MG tablet Take 1/2 (one-half) tablet by mouth twice daily  . Multiple Vitamin (MULTIVITAMIN) tablet Take 1 tablet by mouth daily.  . naproxen sodium (ALEVE) 220 MG tablet Take 220 mg by mouth daily.  . pantoprazole (PROTONIX) 40 MG tablet TAKE ONE TABLET BY MOUTH 30 MINUTES PRIOR TO BREAKFAST  . polycarbophil (EQ FIBER THERAPY) 625 MG tablet Take 625 mg by mouth daily. FIBER THERAPY  . rosuvastatin (CRESTOR) 40 MG tablet Take 1 tablet (40 mg total) by mouth daily.  . Simethicone 125 MG CAPS Take 125 mg by mouth daily as needed (gas).   No facility-administered encounter medications on file as of 04/17/2020.

## 2020-04-17 NOTE — Patient Instructions (Signed)
OSTEOARTHRITIS: Over the counter  Tylenol: 2 tablets up to 3-4 times a day Regular NSAIDS are helpful (avoid in kidney disease and ulcers)  Volaren 1% gel. Over the counter You can apply up to 4 times a day Minimal is absorbed in the bloodstream Cost is about 9 dollars   Supplements: Tart cherry juice and Curcumin (Turmeric extract) have good scientific evidence  - get the concentrated capsules or gelcaps over the counter so you do not get the calories from the juice.  Weight loss will always take stress off of the joints and back  Ice joints on bad days, 20 min, 2-3 x / day REGULAR EXERCISE: swimming, Yoga, Tai Chi, bicycle (NON-IMPACT activity)

## 2020-04-18 ENCOUNTER — Telehealth: Payer: Self-pay

## 2020-04-18 NOTE — Telephone Encounter (Signed)
This is reviewed.

## 2020-04-18 NOTE — Telephone Encounter (Addendum)
Dr. Derrel Nip (on call doctor) was called with results and patient was advised to go to [facility] last night.

## 2020-04-18 NOTE — Telephone Encounter (Signed)
[place] [name] - Client TELEPHONE ADVICE RECORD [employer] Patient Name: Amanda [name] Malenfant Gender: Female DOB: November 30, 1951 Age: 69 Y 11 M Return Phone Number: Address: City/State/Zip:  Client Amanda [name] - Client Client Site Amanda [name] - MD Contact Type Call Who Is Calling Lab Lab Name [facility] Radiology Lab Phone Number [phone] Lab Tech Name [facility] Lab Reference Number Chief Complaint Lab Result (Critical or Stat) Call Type Lab Send to RN Reason for Call Report lab results Initial Comment Caller states she is calling from [facility] Radiology and has a stat report. Additional Comment Translation No Nurse Assessment Nurse: Amanda [name] Date/Time (Eastern Time): 04/17/2020 6:00:07 PM Is there an on-call provider listed? ---Yes Please list name of person reporting value (Lab Employee) and a contact number. ---[facility] Radiology [phone] [facility] Please document the following items: Lab name Lab value (read back to lab to verify) Reference range for lab value Date and time blood was drawn Collect time of birth for bilirubin results ---04/17/20 12:30 PM Stat x-ray right wrist: Possible scaphoid fracture. Ordered by Dr. Edilia Bo Please collect the patient contact information from the lab. (name, phone number and address) ---[phone] cell [phone] home List any special notes provided by lab. ---N/A Guidelines Guideline Title Affirmed Question Affirmed Notes Nurse Date/Time ([place] Time) Disp. Time Amanda [name] Time) Disposition Final User 04/17/2020 6:14:43 PM Called On-Call Provider Wiser, RN, [name] 04/17/2020 6:15:18 PM Send To RN Personal Wiser, RN, [name] 04/17/2020 6:15:39 PM Attempt made - line busy Wiser, RN, [name] 04/17/2020 6:15:31 PM Clinical Call Yes Wiser, RN, [name] PLEASE NOTE: All timestamps contained within this report are represented  as [place] Standard Time. CONFIDENTIALTY NOTICE: This fax transmission is intended only for the addressee. It contains information that is legally privileged, confidential or otherwise protected from use or disclosure. If you are not the intended recipient, you are strictly prohibited from reviewing, disclosing, copying using or disseminating any of this information or taking any action in reliance on or regarding this information. If you have received this fax in error, please notify us immediately by telephone so that we can arrange for its return to [place]. Phone: [phone], Toll-Free: [phone], Fax: [phone] Page: 2 of 2 Call Id: [id number] Comments User: [name] Bo, RN Date/Time Amanda [name] Time): 04/17/2020 6:39:50 PM Caller contacted: Advised of X-ray results. Dr. Derrel Nip notified of STAT x-ray results. MD advised that caller be contacted and sent to walk in orthopedic clinic. [facility] [address] in [place] is open until 7:30 pm. [phone]. Patient will need a second x-ray and possibly a brace. Understanding verbalized. Paging DoctorName Phone DateTime Result/Outcome Message Type Notes Amanda [name] - MD [id number] 04/17/2020 6:14:43 PM Called On Call Provider - Reached Doctor Paged Amanda [name] - MD 04/17/2020 6:15:02 PM Spoke with On Call - General Message Result Dr. Derrel Nip notified of STAT x-ray results. MD advised that caller be contacted and sent to walk in orthopedic clinic. [facility] [address] in [place] is open until 7:30 pm. [phone]. Patient will need a second x-ray and possibly a brace. Provider will notify PCP and would like nurse to attempt to reach patient x 3. If unable to reach patient after three attempts may close the chart.

## 2020-04-25 ENCOUNTER — Other Ambulatory Visit: Payer: Self-pay | Admitting: Family Medicine

## 2020-04-25 NOTE — Telephone Encounter (Signed)
Last office visit 04/17/2020 for right wrist injury.  Last refilled 11/09/2019 for #90 with no refills.  Last Lipid 12/15/2018.  Ok to refill?

## 2020-04-26 ENCOUNTER — Other Ambulatory Visit: Payer: Self-pay | Admitting: *Deleted

## 2020-04-26 NOTE — Telephone Encounter (Signed)
Last office visit 04/17/2020 for Right wrist injury.  Last refilled 11/06/2019 for #30 with 5 refills.  No future appointments with PCP.

## 2020-04-28 MED ORDER — CLONAZEPAM 0.5 MG PO TABS: 0.5000 mg | ORAL_TABLET | Freq: Two times a day (BID) | ORAL | 5 refills | Status: DC | PRN

## 2020-05-06 ENCOUNTER — Other Ambulatory Visit: Payer: Self-pay | Admitting: Family Medicine

## 2020-06-14 ENCOUNTER — Other Ambulatory Visit: Payer: Self-pay | Admitting: Family Medicine

## 2020-06-17 NOTE — Telephone Encounter (Signed)
Last office visit 04/17/2020 for right wrist injury.  Last refilled 11/09/2019 for #50 with 2 refills.  No future appointments with PCP.

## 2020-06-20 ENCOUNTER — Telehealth: Payer: Self-pay | Admitting: Family Medicine

## 2020-06-20 DIAGNOSIS — R519 Headache, unspecified: Secondary | ICD-10-CM

## 2020-06-20 NOTE — Telephone Encounter (Signed)
Please try and schedule MWV with nurse and CPE with Dr. Copland.   

## 2020-06-21 NOTE — Telephone Encounter (Signed)
Left message asking pt to call office  °

## 2020-07-11 NOTE — Telephone Encounter (Signed)
Left message asking pt to call office  °

## 2020-07-25 NOTE — Telephone Encounter (Signed)
9/2 medicare wellness and labs 9/9 cpx

## 2020-08-08 ENCOUNTER — Other Ambulatory Visit: Payer: Self-pay

## 2020-08-08 ENCOUNTER — Ambulatory Visit: Payer: Medicare Other

## 2020-08-08 ENCOUNTER — Other Ambulatory Visit (INDEPENDENT_AMBULATORY_CARE_PROVIDER_SITE_OTHER): Payer: Medicare Other

## 2020-08-08 ENCOUNTER — Telehealth: Payer: Self-pay | Admitting: Family Medicine

## 2020-08-08 DIAGNOSIS — E7800 Pure hypercholesterolemia, unspecified: Secondary | ICD-10-CM | POA: Diagnosis not present

## 2020-08-08 DIAGNOSIS — E538 Deficiency of other specified B group vitamins: Secondary | ICD-10-CM | POA: Diagnosis not present

## 2020-08-08 DIAGNOSIS — E559 Vitamin D deficiency, unspecified: Secondary | ICD-10-CM

## 2020-08-08 DIAGNOSIS — E1121 Type 2 diabetes mellitus with diabetic nephropathy: Secondary | ICD-10-CM | POA: Diagnosis not present

## 2020-08-08 DIAGNOSIS — Z79899 Other long term (current) drug therapy: Secondary | ICD-10-CM | POA: Diagnosis not present

## 2020-08-08 LAB — CBC WITH DIFFERENTIAL/PLATELET
Basophils Absolute: 0.1 10*3/uL (ref 0.0–0.1)
Basophils Relative: 0.9 % (ref 0.0–3.0)
Eosinophils Absolute: 0.2 10*3/uL (ref 0.0–0.7)
Eosinophils Relative: 3.8 % (ref 0.0–5.0)
HCT: 38.3 % (ref 36.0–46.0)
Hemoglobin: 12.9 g/dL (ref 12.0–15.0)
Lymphocytes Relative: 20.8 % (ref 12.0–46.0)
Lymphs Abs: 1.1 10*3/uL (ref 0.7–4.0)
MCHC: 33.8 g/dL (ref 30.0–36.0)
MCV: 101.4 fl — ABNORMAL HIGH (ref 78.0–100.0)
Monocytes Absolute: 0.3 10*3/uL (ref 0.1–1.0)
Monocytes Relative: 6.3 % (ref 3.0–12.0)
Neutro Abs: 3.7 10*3/uL (ref 1.4–7.7)
Neutrophils Relative %: 68.2 % (ref 43.0–77.0)
Platelets: 191 10*3/uL (ref 150.0–400.0)
RBC: 3.78 Mil/uL — ABNORMAL LOW (ref 3.87–5.11)
RDW: 11.9 % (ref 11.5–15.5)
WBC: 5.4 10*3/uL (ref 4.0–10.5)

## 2020-08-08 LAB — HEPATIC FUNCTION PANEL
ALT: 12 U/L (ref 0–35)
AST: 15 U/L (ref 0–37)
Albumin: 4.1 g/dL (ref 3.5–5.2)
Alkaline Phosphatase: 48 U/L (ref 39–117)
Bilirubin, Direct: 0.1 mg/dL (ref 0.0–0.3)
Total Bilirubin: 0.5 mg/dL (ref 0.2–1.2)
Total Protein: 6.1 g/dL (ref 6.0–8.3)

## 2020-08-08 LAB — VITAMIN B12: Vitamin B-12: 332 pg/mL (ref 211–911)

## 2020-08-08 LAB — LIPID PANEL
Cholesterol: 156 mg/dL (ref 0–200)
HDL: 49.5 mg/dL (ref >39.00)
LDL Cholesterol: 85 mg/dL (ref 0–99)
NonHDL: 106.29
Total CHOL/HDL Ratio: 3
Triglycerides: 104 mg/dL (ref 0.0–149.0)
VLDL: 20.8 mg/dL (ref 0.0–40.0)

## 2020-08-08 LAB — BASIC METABOLIC PANEL
BUN: 11 mg/dL (ref 6–23)
CO2: 31 mEq/L (ref 19–32)
Calcium: 9.3 mg/dL (ref 8.4–10.5)
Chloride: 103 mEq/L (ref 96–112)
Creatinine, Ser: 0.81 mg/dL (ref 0.40–1.20)
GFR: 69.86 mL/min (ref >60.00)
Glucose, Bld: 152 mg/dL — ABNORMAL HIGH (ref 70–99)
Potassium: 4.3 mEq/L (ref 3.5–5.1)
Sodium: 139 mEq/L (ref 135–145)

## 2020-08-08 LAB — VITAMIN D 25 HYDROXY (VIT D DEFICIENCY, FRACTURES): VITD: 41.56 ng/mL (ref 30.00–100.00)

## 2020-08-08 LAB — MICROALBUMIN / CREATININE URINE RATIO
Creatinine,U: 34 mg/dL
Microalb Creat Ratio: 2.3 mg/g (ref 0.0–30.0)
Microalb, Ur: 0.8 mg/dL (ref 0.0–1.9)

## 2020-08-08 LAB — HEMOGLOBIN A1C: Hgb A1c MFr Bld: 6.3 % (ref 4.6–6.5)

## 2020-08-08 NOTE — Telephone Encounter (Signed)
-----   Message from Ellamae Sia sent at 08/08/2020 10:00 AM EDT ----- Regarding: lab orders asap Patient is scheduled for CPX labs, please order future labs, Thanks , Terri   Copland pt

## 2020-08-08 NOTE — Addendum Note (Signed)
Addended by: Ellamae Sia on: 08/08/2020 10:21 AM   Modules accepted: Orders

## 2020-08-15 ENCOUNTER — Other Ambulatory Visit: Payer: Self-pay

## 2020-08-15 ENCOUNTER — Ambulatory Visit (INDEPENDENT_AMBULATORY_CARE_PROVIDER_SITE_OTHER): Payer: Medicare Other | Admitting: Family Medicine

## 2020-08-15 ENCOUNTER — Encounter: Payer: Self-pay | Admitting: Family Medicine

## 2020-08-15 VITALS — BP 126/70 | HR 88 | Temp 98.5°F | Ht 65.5 in | Wt 224.5 lb

## 2020-08-15 DIAGNOSIS — R10819 Abdominal tenderness, unspecified site: Secondary | ICD-10-CM | POA: Diagnosis not present

## 2020-08-15 DIAGNOSIS — R1084 Generalized abdominal pain: Secondary | ICD-10-CM | POA: Diagnosis not present

## 2020-08-15 DIAGNOSIS — Z23 Encounter for immunization: Secondary | ICD-10-CM | POA: Diagnosis not present

## 2020-08-15 DIAGNOSIS — R1310 Dysphagia, unspecified: Secondary | ICD-10-CM

## 2020-08-15 DIAGNOSIS — Z800 Family history of malignant neoplasm of digestive organs: Secondary | ICD-10-CM | POA: Diagnosis not present

## 2020-08-15 DIAGNOSIS — Z0001 Encounter for general adult medical examination with abnormal findings: Secondary | ICD-10-CM | POA: Diagnosis not present

## 2020-08-15 DIAGNOSIS — R1319 Other dysphagia: Secondary | ICD-10-CM

## 2020-08-15 DIAGNOSIS — Z0000 Encounter for general adult medical examination without abnormal findings: Secondary | ICD-10-CM

## 2020-08-15 DIAGNOSIS — R140 Abdominal distension (gaseous): Secondary | ICD-10-CM

## 2020-08-15 NOTE — Progress Notes (Signed)
Amanda [name] T. Amanda Dao, MD, Amanda [name] at [facility] [place], [address]  Phone: [phone]  FAX: [phone]  Amanda [name] - 69 y.o. female  MRN [id number]  Date of Birth: Jul 17, 1951  Date: 08/15/2020  PCP: Amanda Loffler, MD  Referral: Amanda Loffler, MD  Chief Complaint  Patient presents with  . Medicare Wellness    This visit occurred during the SARS-CoV-2 public health emergency.  Safety protocols were in place, including screening questions prior to the visit, additional usage of staff PPE, and extensive cleaning of exam room while observing appropriate contact time as indicated for disinfecting solutions.   Patient Care Team: Amanda Loffler, MD as PCP - General (Family Medicine) Amanda [name], [facility] as Pharmacist (Pharmacist) Subjective:   Amanda [name] is a 69 y.o. pleasant patient who presents for a medicare wellness examination:  Health Maintenance Summary Reviewed and updated, unless pt declines services.  Tobacco History Reviewed. Non-smoker, former Alcohol: No concerns, no excessive use Exercise Habits: Minimal  STD concerns: none Drug Use: None Birth control method: n/a Menses regular: n/a Lumps or breast concerns: no Breast Cancer Family History: no  Feeling confused.    She has a number of issues over and above general health maintenance.  She has some sadness with some various members having cancer and 1 death from a pancreatic cancer.  She was is smoked for quite some time.  Also her husband's EP cardiologist is contemplating turning off his defibrillator, and she has some questions regarding this.  Dr. Caryl [name] saw her husband. Wants her to come with him.   Has some low back pain.  Sister also had a fall.   Sister had a tumor on her adrenal gland.  Step dad died from pancreatic cancer.    In the past she has had an esophageal stricture  and this required dilation. Having a lot of esophageal spasm.  Dysphagia and odynophagia  Health Maintenance  Topic Date Due  . FOOT EXAM  12/19/2011  . DEXA SCAN  Never done  . OPHTHALMOLOGY EXAM  07/30/2020  . HEMOGLOBIN A1C  02/05/2021  . URINE MICROALBUMIN  08/08/2021  . MAMMOGRAM  08/31/2021  . COLONOSCOPY  11/19/2025  . TETANUS/TDAP  09/14/2027  . INFLUENZA VACCINE  Completed  . COVID-19 Vaccine  Completed  . Hepatitis C Screening  Completed  . PNA vac Low Risk Adult  Completed   Immunization History  Administered Date(s) Administered  . Fluad Quad(high Dose 65+) 08/15/2020  . Influenza Split 09/09/2011, 10/10/2012  . Influenza Whole 09/20/2006  . Influenza, High Dose Seasonal PF 08/15/2019  . Influenza,inj,Quad PF,6+ Mos 09/18/2015, 09/16/2016, 09/13/2017  . PFIZER SARS-COV-2 Vaccination 01/25/2020, 02/01/2020  . PPD Test 12/27/2015  . Pneumococcal Conjugate-13 09/18/2015  . Pneumococcal Polysaccharide-23 11/23/2016  . Td 12/26/1998, 09/13/2017    Patient Active Problem List   Diagnosis Date Noted  . Myelopathy ([place]) 10/26/2018  . 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 sting   . COPD (chronic obstructive pulmonary disease) ([place]) 10/30/2016  . Gastroesophageal reflux disease 09/17/2016  . Memory loss 10/29/2015  . Primary osteoarthritis of right knee 06/16/2015  . Smoker 05/10/2015  . Solitary pulmonary nodule 03/28/2013  . Hypertension   . Vitamin D deficiency 05/29/2009  . Pure hypercholesterolemia 12/07/2007  . Major depressive disorder, recurrent episode, in partial remission ([place]) 12/07/2007  . Diabetes mellitus type 2, controlled ([place]) 08/10/2007    Past Medical History:  Diagnosis Date  .  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 sting    bee stings  . Anxiety   . Aortic stenosis    a. 2013: nl LV sys fxn, mild MR, no evidence of pulm htn; b. TTE 8/17: EF 60-65%, no RWMA, nl LV dia fxn,  mild AS, mod AI  . COPD (chronic obstructive pulmonary disease) ([place]) 10/30/2016  . Degenerative arthritis of  knee, bilateral 06/16/2015  . Depression   . Diabetes ([place])    diet managed-type 11  . GERD (gastroesophageal reflux disease)   . Heart murmur   . Hiatal hernia with gastroesophageal reflux 1997  . Hyperlipidemia   . Hypertension   . Neuromuscular disorder ([place])    nerve damage in back and shoulder  . Osteoporosis   . Palpitations    a. 03/2012: 48-hour Holter monitor showed intermittent sinus tachycardia with occasional PACs, without other significant arrhythmia  . Thyroid disease    hyperthyroidism as a teenager - received some injections age 7 that corrected this   . Tobacco abuse     Past Surgical History:  Procedure Laterality Date  . ABDOMINAL HYSTERECTOMY  1993  . ANTERIOR CERVICAL DECOMP/DISCECTOMY FUSION N/A 10/26/2018   Procedure: ACDF C4-C5 C5-C6 C6-C7;  Surgeon: Kary Kos, MD;  Location: [facility];  Service: Neurosurgery;  Laterality: N/A;  . APPENDECTOMY    . BACK SURGERY    . BREAST BIOPSY Right 2007   benign  . CHOLECYSTECTOMY    . COLONOSCOPY     30 years ago was normal per pt.   Marland Kitchen DILATION AND CURETTAGE OF UTERUS    . ESOPHAGEAL DILATION     x 3  . fractured leg Right   . OOPHORECTOMY    . OVARIAN CYST REMOVAL  1972  . SALIVARY GLAND SURGERY    . TONSILLECTOMY     4 yoa  . TOTAL KNEE ARTHROPLASTY Left 12/24/2015   Procedure: LEFT TOTAL KNEE ARTHROPLASTY;  Surgeon: [name], MD;  Location: [facility];  Service: Orthopedics;  Laterality: Left;  . TOTAL KNEE ARTHROPLASTY Right 12/30/2017   Procedure: RIGHT TOTAL KNEE ARTHROPLASTY;  Surgeon: [name], MD;  Location: [facility];  Service: Orthopedics;  Laterality: Right;  . WRIST FRACTURE SURGERY  11/2008    Family History  Problem Relation Age of Onset  . Hyperlipidemia Mother   . Arthritis Mother   . Diabetes Mother   . Heart disease Mother   . Colon polyps Mother   . Cancer Paternal Grandmother        breast  . Hypercholesterolemia Sister   . Colon polyps Sister   . Breast cancer Sister     . Colon cancer Neg Hx   . Esophageal cancer Neg Hx   . Rectal cancer Neg Hx   . Stomach cancer Neg Hx   . Pancreatic cancer Neg Hx     Past Medical History, Surgical History, Social History, Family History, Problem List, Medications, and Allergies have been reviewed and updated if relevant.  Review of Systems: Pertinent positives are listed above.  Otherwise, a full 14 point review of systems has been done in full and it is negative except where it is noted positive.  Objective:   BP 126/70   Pulse 88   Temp 98.5 F (36.9 C) (Temporal)   Ht 5' 5.5" (1.664 m)   Wt 224 lb 8 oz (101.8 kg)   SpO2 96%   BMI 36.79 kg/m  Fall Risk  08/15/2020 05/30/2019 06/02/2017 04/09/2017 09/04/2016  Falls in the past  year? 1 0 Yes Yes Yes  Number falls in past yr: 0 - 1 2 or more -  Comment - - pt reports foot slipped while walking on snow; no medical treatment - -  Injury with Fall? 1 - Yes No No   Ideal Body Weight: Weight in (lb) to have BMI = 25: 152.2  Hearing Screening   Method: Audiometry   _0  _1  _2  _3  _4  _5  _6  _7  _8   Right ear:   0 0 20  20    Left ear:   0 0 40  20      Visual Acuity Screening   Right eye Left eye Both eyes  Without correction: _9  With correction:      Depression screen [facility] 2/9 08/15/2020 05/30/2019 06/02/2017  Decreased Interest 1 0 0  Down, Depressed, Hopeless 1 0 0  PHQ - 2 Score 2 0 0  Altered sleeping 2 0 -  Tired, decreased energy 3 0 -  Change in appetite 1 0 -  Feeling bad or failure about yourself  1 0 -  Trouble concentrating 3 0 -  Moving slowly or fidgety/restless 3 0 -  Suicidal thoughts 0 0 -  PHQ-9 Score 15 0 -  Difficult doing work/chores Somewhat difficult Not difficult at all -  Some recent data might be hidden     GEN: well developed, well nourished, no acute distress Eyes: conjunctiva and lids normal, PERRLA, EOMI ENT: TM clear, nares clear, oral exam WNL Neck: supple, no lymphadenopathy, no  thyromegaly, no JVD Pulm: clear to auscultation and percussion, respiratory effort normal CV: regular rate and rhythm, S1-S2, no murmur, rub or gallop, no bruits Chest: no scars, masses, no lumps BREAST: breast exam declined GI: soft, non-tender; no hepatosplenomegaly, she does have some fullness in the right lower quadrant as well as some tenderness relatively diffusely.   GU: GU exam declined Lymph: no cervical, axillary or inguinal adenopathy MSK: gait normal, muscle tone and strength WNL, no joint swelling, effusions, discoloration, crepitus  SKIN: clear, good turgor, color WNL, no rashes, lesions, or ulcerations Neuro: normal mental status, normal strength, sensation, and motion Psych: alert; oriented to person, place and time, normally interactive and not anxious or depressed in appearance.   All labs reviewed with patient.  Lipids: Lab Results  Component Value Date   CHOL 156 08/08/2020   Lab Results  Component Value Date   HDL 49.50 08/08/2020   Lab Results  Component Value Date   LDLCALC 85 08/08/2020   Lab Results  Component Value Date   TRIG 104.0 08/08/2020   Lab Results  Component Value Date   CHOLHDL 3 08/08/2020   CBC: CBC Latest Ref Rng & Units 08/08/2020 10/24/2019 05/31/2019  WBC 4.0 - 10.5 K/uL 5.4 13.7(H) 5.1  Hemoglobin 12.0 - 15.0 g/dL 12.9 12.4 12.7  Hematocrit 36 - 46 % 38.3 37.1 38.1  Platelets 150 - 400 K/uL 191.0 164 756.4    Basic Metabolic Panel:    Component Value Date/Time   NA 139 08/08/2020 1021   K 4.3 08/08/2020 1021   CL 103 08/08/2020 1021   CO2 31 08/08/2020 1021   BUN 11 08/08/2020 1021   CREATININE 0.81 08/08/2020 1021   GLUCOSE 152 (H) 08/08/2020 1021   CALCIUM 9.3 08/08/2020 1021   Hepatic Function Latest Ref Rng & Units 08/08/2020 10/24/2019 12/15/2018  Total Protein 6.0 - 8.3 g/dL 6.1 6.9 6.7  Albumin 3.5 - 5.2 g/dL 4.1 4.1 4.2  AST 0 - 37 U/L _0 ALT 0 - 35 U/L _1 Alk Phosphatase 39 - 117 U/L 48 40 48    Total Bilirubin 0.2 - 1.2 mg/dL 0.5 0.8 0.6  Bilirubin, Direct 0.0 - 0.3 mg/dL 0.1 - <0.1    Lab Results  Component Value Date   HGBA1C 6.3 08/08/2020   Lab Results  Component Value Date   TSH 0.70 06/02/2017    No results found.  Assessment and Plan:     ICD-10-CM   1. Healthcare maintenance  Z00.00   2. Need for influenza vaccination  Z23 Flu Vaccine QUAD High Dose(Fluad)  3. Esophageal dysphagia  R13.10 Ambulatory referral to Gastroenterology    CT Abdomen Pelvis W Contrast  4. Odynophagia  R13.10 Ambulatory referral to Gastroenterology    CT Abdomen Pelvis W Contrast  5. Family history of pancreatic cancer  Z80.0   6. Generalized abdominal pain  R10.84 CT Abdomen Pelvis W Contrast  7. Abdominal tenderness in right flank  R10.819 CT Abdomen Pelvis W Contrast  8. Abdominal distension (gaseous)  R14.0 CT Abdomen Pelvis W Contrast   I am going to have her see gastroenterology giving her her dysphagia and to a much lesser extent odynophagia.  Appreciate their help.  She also is having some abdominal pain and I can appreciate a fullness in her right lower quadrant.  She has had a colonoscopy that was normal.  Obtain a CT of the abdomen with pelvis to evaluate for potential neoplasm.  Health Maintenance Exam: The patient's preventative maintenance and recommended screening tests for an annual wellness exam were reviewed in full today. Brought up to date unless services declined.  Counselled on the importance of diet, exercise, and its role in overall health and mortality. The patient's FH and SH was reviewed, including their home life, tobacco status, and drug and alcohol status.  Follow-up in 1 year for physical exam or additional follow-up below.  I have personally reviewed the Medicare Annual Wellness questionnaire and have noted 1. The patient's medical and social history 2. Their use of alcohol, tobacco or illicit drugs 3. Their current medications and  supplements 4. The patient's functional ability including ADL's, fall risks, home safety risks and hearing or visual             impairment. 5. Diet and physical activities 6. Evidence for depression or mood disorders 7. Reviewed Updated provider list, see scanned forms and [facility] Snapshot.  8. Reviewed whether or not the patient has HCPOA or living will, and discussed what this means with the patient.  Recommended she bring in a copy for his chart in [facility].  The patients weight, height, BMI and visual acuity have been recorded in the chart I have made referrals, counseling and provided education to the patient based review of the above and I have provided the pt with a written personalized care plan for preventive services.  I have provided the patient with a copy of your personalized plan for preventive services. Instructed to take the time to review along with their updated medication list.  Follow-up: No follow-ups on file. Or follow-up in 1 year unless noted.  Future Appointments  Date Time Provider [place]  08/20/2020  3:30 PM [facility] NURSE HEALTH ADVISOR [facility]  10/24/2020 11:00 AM [facility] CCM PHARMACIST [facility]    No orders of the defined types were placed in this encounter.  Medications Discontinued During This Encounter  Medication Reason  .  guaiFENesin (MUCINEX) 600 MG 12 hr tablet Completed Course  . loratadine (CLARITIN) 10 MG tablet Completed Course  . magnesium oxide (MAG-OX) 400 MG tablet Completed Course   Orders Placed This Encounter  Procedures  . CT Abdomen Pelvis W Contrast  . Flu Vaccine QUAD High Dose(Fluad)  . Ambulatory referral to Gastroenterology    Signed,  Frederico Hamman T. Jeff Mccallum, MD   Allergies as of 08/15/2020      Reactions   Diazepam Other (See Comments)   REACTION: pt states she gets very angry and abusive verbally on this medication      Medication List       Accurate as of August 15, 2020  3:28 PM. If you have any  questions, ask your nurse or doctor.        STOP taking these medications   guaiFENesin 600 MG 12 hr tablet Commonly known as: MUCINEX Stopped by: Amanda Loffler, MD   loratadine 10 MG tablet Commonly known as: CLARITIN Stopped by: Amanda Loffler, MD   magnesium oxide 400 MG tablet Commonly known as: MAG-OX Stopped by: Amanda Loffler, MD     TAKE these medications   acetaminophen 650 MG CR tablet Commonly known as: TYLENOL Take 650 mg by mouth 3 times/day as needed-between meals & bedtime (for headaches).   albuterol 108 (90 Base) MCG/ACT inhaler Commonly known as: VENTOLIN HFA Inhale 2 puffs into the lungs every 4 (four) hours as needed for wheezing or shortness of breath.   amitriptyline 50 MG tablet Commonly known as: ELAVIL TAKE 2 TABLETS BY MOUTH DAILY AT BEDTIME.   aspirin EC 81 MG tablet Take 81 mg by mouth daily.   buPROPion 150 MG 24 hr tablet Commonly known as: WELLBUTRIN XL TAKE 1 TABLET (150 MG TOTAL) BY MOUTH DAILY.   calcium carbonate 500 MG chewable tablet Commonly known as: TUMS - dosed in mg elemental calcium Chew 2 tablets by mouth daily as needed for indigestion or heartburn.   clonazePAM 0.5 MG tablet Commonly known as: KLONOPIN Take 1 tablet (0.5 mg total) by mouth 2 (two) times daily as needed.   cyclobenzaprine 10 MG tablet Commonly known as: FLEXERIL TAKE 1 TABLET THREE TIMES DAILY AS NEEDED FOR MUSCLE SPAMS   EQ Fiber Therapy 625 MG tablet Generic drug: polycarbophil Take 625 mg by mouth daily. FIBER THERAPY   FLUoxetine 40 MG capsule Commonly known as: PROZAC TAKE 1 CAPSULE (40 MG TOTAL) BY MOUTH DAILY.   metoprolol tartrate 25 MG tablet Commonly known as: LOPRESSOR Take 1/2 (one-half) tablet by mouth twice daily   multivitamin tablet Take 1 tablet by mouth daily.   naproxen sodium 220 MG tablet Commonly known as: ALEVE Take 220 mg by mouth daily.   pantoprazole 40 MG tablet Commonly known as: PROTONIX TAKE ONE TABLET  BY MOUTH 30 MINUTES PRIOR TO BREAKFAST   rosuvastatin 40 MG tablet Commonly known as: CRESTOR TAKE 1 TABLET (40 MG TOTAL) BY MOUTH DAILY.   Simethicone 125 MG Caps Take 125 mg by mouth daily as needed (gas).   vitamin C 1000 MG tablet Take 1,000 mg by mouth daily.   Vitamin D 1000 units capsule Take 1,000 Units by mouth daily.

## 2020-08-19 ENCOUNTER — Other Ambulatory Visit: Payer: Self-pay | Admitting: Family Medicine

## 2020-08-19 DIAGNOSIS — R519 Headache, unspecified: Secondary | ICD-10-CM

## 2020-08-20 ENCOUNTER — Ambulatory Visit: Payer: Medicare Other

## 2020-09-03 ENCOUNTER — Other Ambulatory Visit: Payer: Self-pay

## 2020-09-03 ENCOUNTER — Ambulatory Visit
Admission: RE | Admit: 2020-09-03 | Discharge: 2020-09-03 | Disposition: A | Discharge status: Home / Self Care | Payer: Medicare Other | Source: Ambulatory Visit | Attending: Family Medicine | Admitting: Family Medicine

## 2020-09-03 DIAGNOSIS — R1084 Generalized abdominal pain: Secondary | ICD-10-CM

## 2020-09-03 DIAGNOSIS — R10819 Abdominal tenderness, unspecified site: Secondary | ICD-10-CM | POA: Insufficient documentation

## 2020-09-03 DIAGNOSIS — R1319 Other dysphagia: Secondary | ICD-10-CM

## 2020-09-03 DIAGNOSIS — R1310 Dysphagia, unspecified: Secondary | ICD-10-CM | POA: Diagnosis not present

## 2020-09-03 DIAGNOSIS — R109 Unspecified abdominal pain: Secondary | ICD-10-CM | POA: Diagnosis not present

## 2020-09-03 DIAGNOSIS — R140 Abdominal distension (gaseous): Secondary | ICD-10-CM | POA: Diagnosis not present

## 2020-09-03 MED ORDER — IOHEXOL 300 MG/ML  SOLN
100.0000 mL | Freq: Once | INTRAMUSCULAR | Status: AC | PRN
  Administered 2020-09-03: 100 mL via INTRAVENOUS

## 2020-09-05 ENCOUNTER — Encounter: Payer: Self-pay | Admitting: Nurse Practitioner

## 2020-09-19 ENCOUNTER — Ambulatory Visit: Payer: Medicare Other | Admitting: Nurse Practitioner

## 2020-10-18 DIAGNOSIS — Z23 Encounter for immunization: Secondary | ICD-10-CM | POA: Diagnosis not present

## 2020-10-21 ENCOUNTER — Other Ambulatory Visit: Payer: Self-pay | Admitting: Family Medicine

## 2020-10-21 MED ORDER — CLONAZEPAM 0.5 MG PO TABS: 0.5000 mg | ORAL_TABLET | Freq: Two times a day (BID) | ORAL | 5 refills | Status: DC | PRN

## 2020-10-21 NOTE — Telephone Encounter (Signed)
Patient called in stating she is needing to have script for clonazePAM (KLONOPIN) 0.5 MG tablet Sent to [facility] on [address] instead. Stated she is also wanting this done for husband Jameela Michna DOB: 10/28/1948

## 2020-10-21 NOTE — Telephone Encounter (Signed)
Last office visit 08/15/2020 for CPE.  Last refilled 04/28/2020 for #30 with 5 refills.  No future appointments with PCP.

## 2020-10-24 ENCOUNTER — Telehealth: Payer: Medicare Other

## 2020-10-24 ENCOUNTER — Telehealth: Payer: Self-pay

## 2020-10-24 NOTE — Progress Notes (Signed)
Chronic Care Management Pharmacy Assistant   Name: Amanda [name]  MRN: [id number] DOB: 1951-07-07  Reason for Encounter: Disease State  Patient Questions:  1.  Have you seen any other providers since your last visit? Yes, Amanda [name] (PCP)  2.  Any changes in your medicines or health? Yes, See below:   04/17/2020-[name] (PCP)-sclerotic line in her scaphoid on the right wrist. Referred to Orthopedic Urgent care for further evaluation. No further notes available in chart. 09/03/2020-Abdomen CT performed for Esophageal Dysphagia, Odynophagia, family history of pancreatic ca, abdominal pain, abdominal distention and right flank tenderness.  08/15/2020 [name] (PCP)- d/c loratadine(course complete) d/c guaifenesin (course complete), and Mag ox (course complete). Received flu shot   Amanda [name],  69 y.o. , female presents for their Follow-Up CCM visit with the clinical pharmacist via telephone.  PCP : Amanda Loffler, MD  Allergies:   Allergies  Allergen Reactions  . Diazepam Other (See Comments)    REACTION: pt states she gets very angry and abusive verbally on this medication     Medications: Outpatient Encounter Medications as of 10/24/2020  Medication Sig Note  . acetaminophen (TYLENOL) 650 MG CR tablet Take 650 mg by mouth 3 times/day as needed-between meals & bedtime (for headaches).   Marland Kitchen albuterol (PROVENTIL HFA;VENTOLIN HFA) 108 (90 Base) MCG/ACT inhaler Inhale 2 puffs into the lungs every 4 (four) hours as needed for wheezing or shortness of breath.   Marland Kitchen amitriptyline (ELAVIL) 50 MG tablet TAKE 2 TABLETS BY MOUTH DAILY AT BEDTIME.   Marland Kitchen Ascorbic Acid (VITAMIN C) 1000 MG tablet Take 1,000 mg by mouth daily.   Marland Kitchen aspirin EC 81 MG tablet Take 81 mg by mouth daily.   Marland Kitchen buPROPion (WELLBUTRIN XL) 150 MG 24 hr tablet TAKE 1 TABLET (150 MG TOTAL) BY MOUTH DAILY.   . calcium carbonate (TUMS - DOSED IN MG ELEMENTAL CALCIUM) 500 MG chewable tablet Chew 2 tablets by mouth  daily as needed for indigestion or heartburn.   . Cholecalciferol (VITAMIN D) 1000 UNITS capsule Take 1,000 Units by mouth daily.    . clonazePAM (KLONOPIN) 0.5 MG tablet Take 1 tablet (0.5 mg total) by mouth 2 (two) times daily as needed.   . cyclobenzaprine (FLEXERIL) 10 MG tablet TAKE 1 TABLET THREE TIMES DAILY AS NEEDED FOR MUSCLE SPAMS   . FLUoxetine (PROZAC) 40 MG capsule TAKE 1 CAPSULE (40 MG TOTAL) BY MOUTH DAILY.   . metoprolol tartrate (LOPRESSOR) 25 MG tablet Take 1/2 (one-half) tablet by mouth twice daily   . Multiple Vitamin (MULTIVITAMIN) tablet Take 1 tablet by mouth daily.   . naproxen sodium (ALEVE) 220 MG tablet Take 220 mg by mouth daily. 10/17/2018: Scheduled   . pantoprazole (PROTONIX) 40 MG tablet TAKE ONE TABLET BY MOUTH 30 MINUTES PRIOR TO BREAKFAST   . polycarbophil (EQ FIBER THERAPY) 625 MG tablet Take 625 mg by mouth daily. FIBER THERAPY   . rosuvastatin (CRESTOR) 40 MG tablet TAKE 1 TABLET (40 MG TOTAL) BY MOUTH DAILY.   . Simethicone 125 MG CAPS Take 125 mg by mouth daily as needed (gas).    No facility-administered encounter medications on file as of 10/24/2020.    Current Diagnosis: Patient Active Problem List   Diagnosis Date Noted  . Myelopathy ([place]) 10/26/2018  . 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 sting   . COPD (chronic obstructive pulmonary disease) ([place]) 10/30/2016  . Gastroesophageal reflux disease 09/17/2016  . Memory loss 10/29/2015  . Primary osteoarthritis of right knee 06/16/2015  .  Smoker 05/10/2015  . Solitary pulmonary nodule 03/28/2013  . Hypertension   . Vitamin D deficiency 05/29/2009  . Pure hypercholesterolemia 12/07/2007  . Major depressive disorder, recurrent episode, in partial remission ([place]) 12/07/2007  . Diabetes mellitus type 2, controlled ([place]) 08/10/2007    Goals Addressed   None     Follow-Up:  Pharmacist Review  .. 10/24/2020 Name: Amanda [name] MRN: [id number] DOB: Aug 14, 1951 Amanda [name] is a 69 y.o. year old female who is a  primary care patient of [name], Spencer, MD.  Comprehensive medication review performed; Spoke to patient regarding cholesterol  Lipid Panel    Component Value Date/Time   CHOL 156 08/08/2020 1021   CHOL 201 (H) 09/18/2013 1016   TRIG 104.0 08/08/2020 1021   HDL 49.50 08/08/2020 1021   HDL 53 09/18/2013 1016   LDLCALC 85 08/08/2020 1021   LDLCALC 130 (H) 09/18/2013 1016   LDLDIRECT 209.2 05/28/2009 0909    10-year ASCVD risk score: The 10-year ASCVD risk score Mikey Bussing DC Brooke Bonito., et al., 2013) is: 32.6%   Values used to calculate the score:     Age: 69 years     Sex: Female     Is Non-Hispanic African American: No     Diabetic: Yes     Tobacco smoker: Yes     Systolic Blood Pressure: 240 mmHg     Is BP treated: Yes     HDL Cholesterol: 49.5 mg/dL     Total Cholesterol: 156 mg/dL  . Current antihyperlipidemic regimen:   Rosuvastatin 40 mg - 1 tablet daily  Aspirin 81 mg - 1 tablet daily . Previous antihyperlipidemic medications tried: none . ASCVD risk enhancing conditions: age >21, DM, HTN and current smoker . What recent interventions/DTPs have been made by any provider to improve Cholesterol control since last CPP Visit: none . Any recent hospitalizations or ED visits since last visit with CPP? No . What diet changes have been made to improve Cholesterol?  o Has cut out fried foods . What exercise is being done to improve Cholesterol?  o Pt performs household chores on routine basis. She does not have a formal exercise program but is on her feet all day.     Spoke with patient regarding BP  Recent Office Vitals: BP Readings from Last 3 Encounters:  08/15/20 126/70  04/17/20 140/84  02/19/20 136/76   Pulse Readings from Last 3 Encounters:  08/15/20 88  04/17/20 81  02/19/20 89    Wt Readings from Last 3 Encounters:  08/15/20 224 lb 8 oz (101.8 kg)  04/17/20 229 lb 8 oz (104.1 kg)  02/19/20 235 lb (106.6 kg)     Kidney Function Lab Results  Component Value  Date/Time   CREATININE 0.81 08/08/2020 10:21 AM   CREATININE 0.90 10/24/2019 03:57 AM   GFR 69.86 08/08/2020 10:21 AM   GFRNONAA >60 10/24/2019 03:57 AM   GFRAA >60 10/24/2019 03:57 AM    BMP Latest Ref Rng & Units 08/08/2020 10/24/2019 05/31/2019  Glucose 70 - 99 mg/dL 152(H) 179(H) 165(H)  BUN 6 - 23 mg/dL 11 12 9   Creatinine 0.40 - 1.20 mg/dL 0.81 0.90 0.83  Sodium 135 - 145 mEq/L 139 136 139  Potassium 3.5 - 5.1 mEq/L 4.3 3.9 4.0  Chloride 96 - 112 mEq/L 103 106 105  CO2 19 - 32 mEq/L 31 23 29   Calcium 8.4 - 10.5 mg/dL 9.3 8.8(L) 8.8    . Current antihypertensive regimen:   Metoprolol tartrate 25 mg -  1/2 tablet BID . How often are you checking your Blood Pressure? when feeling symptomatic . Current home BP readings:   10/22/2020 128/56  10/23/2020 128/56 and 125/62  . What recent interventions/DTPs have been made by any provider to improve Blood Pressure control since last CPP Visit: none . What diet changes have been made to improve Blood Pressure Control?  o Stop frying foods instead food is baked or broiled o Try's to cook with less salt   Adherence Review: Is the patient currently on ACE/ARB medication? Yes Does the patient have >5 day gap between last estimated fill dates? Yes mail order pharmacy has not been getting refills prior to medication being needed causing delays in medication shipment  Reviewed with patient hydration role in hypotensive episodes. Encouraged more non-caffeneinated beverages as she is busy around home. Pt uses salt for cooking and to season individual plates. Reviewed methods to add flavor without adding sodium.   Pt is having a lot of trouble with pain in her left knee at the site of a previous knee replacement.   Per Michelle's previous note she has been successful in weaning down to 0.5 tab of clonazepam at bedtime only. She is also smoking less.  Pt will have great grand daughter born in May and is using that as additional encouragement to  stop smoking. She is not smoking inside and believes with the cold weather she will be further encouraged to decrease smoking.  Pt is down to about 0.25 packs most days.  Encouraged pt to care for emotional well being as the needs for care giving increase with her spouse.  Reviewed importance of self care to decrease burn out. She speaks with her daughter almost every day.  Discussed [facility] pharmacy compliance packaging with pt. She is interested in services. Jovita Gamma D, made aware of desire to transfer pharmacy and information in today's phone call.

## 2020-10-30 NOTE — Telephone Encounter (Signed)
Patient called in wanted to know about reaching out to [facility] she was suppose to call back to get a list of her prescription,

## 2020-11-07 ENCOUNTER — Ambulatory Visit: Payer: Medicare Other | Admitting: Cardiovascular Disease

## 2020-11-07 ENCOUNTER — Other Ambulatory Visit: Payer: Self-pay

## 2020-11-07 ENCOUNTER — Ambulatory Visit (INDEPENDENT_AMBULATORY_CARE_PROVIDER_SITE_OTHER): Payer: Medicare Other

## 2020-11-07 DIAGNOSIS — I350 Nonrheumatic aortic (valve) stenosis: Secondary | ICD-10-CM

## 2020-11-07 LAB — ECHOCARDIOGRAM COMPLETE
AR max vel: 1.25 cm2
AV Area VTI: 1.29 cm2
AV Area mean vel: 1.22 cm2
AV Mean grad: 22.3 mmHg
AV Peak grad: 44.5 mmHg
Ao pk vel: 3.34 m/s
Area-P 1/2: 2.46 cm2
Calc EF: 54.4 %
P 1/2 time: 434 ms
S' Lateral: 2.5 cm
Single Plane A2C EF: 53.6 %
Single Plane A4C EF: 53.2 %

## 2020-11-08 ENCOUNTER — Encounter: Payer: Self-pay | Admitting: Cardiovascular Disease

## 2020-11-08 ENCOUNTER — Ambulatory Visit (INDEPENDENT_AMBULATORY_CARE_PROVIDER_SITE_OTHER): Payer: Medicare Other | Admitting: Cardiovascular Disease

## 2020-11-08 VITALS — BP 140/70 | HR 82 | Ht 67.0 in | Wt 221.5 lb

## 2020-11-08 DIAGNOSIS — I350 Nonrheumatic aortic (valve) stenosis: Secondary | ICD-10-CM | POA: Diagnosis not present

## 2020-11-08 DIAGNOSIS — I25118 Atherosclerotic heart disease of native coronary artery with other forms of angina pectoris: Secondary | ICD-10-CM

## 2020-11-08 DIAGNOSIS — Z720 Tobacco use: Secondary | ICD-10-CM

## 2020-11-08 DIAGNOSIS — R002 Palpitations: Secondary | ICD-10-CM | POA: Diagnosis not present

## 2020-11-08 DIAGNOSIS — R0602 Shortness of breath: Secondary | ICD-10-CM

## 2020-11-08 DIAGNOSIS — E785 Hyperlipidemia, unspecified: Secondary | ICD-10-CM | POA: Diagnosis not present

## 2020-11-08 MED ORDER — EZETIMIBE 10 MG PO TABS: 10.0000 mg | ORAL_TABLET | Freq: Every day | ORAL | 3 refills | Status: DC

## 2020-11-08 NOTE — Progress Notes (Signed)
Cardiology Office Note   Date:  11/08/2020   ID:  Amanda [name], DOB 1951/10/13, MRN [id number]  PCP:  Owens Loffler, MD  Cardiologist:   Kathlyn Sacramento, MD   No chief complaint on file.     History of Present Illness: Amanda [name] is a 69 y.o. female who presents for a follow-up visit regarding moderate aortic stenosis and mild to moderate nonobstructive coronary artery disease.   She has other chronic medical conditions that include COPD, tobacco use, diet-controlled diabetes, hypertension and hyperlipidemia. CTA of the coronary arteries in September 2019 showed a calcium score of 521 with moderate nonobstructive disease affecting the RCA and LAD.  These were not significant by FFR. Echocardiogram in September 2020 showed an EF of 60 to 65% with moderate aortic stenosis.  Mean gradient was 19 mmHg with valve area of 1.28. She had a repeat echocardiogram yesterday which showed an EF of 60 to 93%, grade 1 diastolic dysfunction, moderate aortic stenosis with a mean gradient of 22 mmHg and valve area of 1.29.  She reports worsening exertional dyspnea over the last 6 months which is currently happening with minimal activities.  She continues to smoke half a pack per day.  She takes her medications regularly.  She has occasional substernal chest pain radiating to her back that usually responds to Flexeril.  Past Medical History:  Diagnosis Date  . 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 sting    bee stings  . Anxiety   . Aortic stenosis    a. 2013: nl LV sys fxn, mild MR, no evidence of pulm htn; b. TTE 8/17: EF 60-65%, no RWMA, nl LV dia fxn,  mild AS, mod AI  . COPD (chronic obstructive pulmonary disease) ([place]) 10/30/2016  . Degenerative arthritis of knee, bilateral 06/16/2015  . Depression   . Diabetes ([place])    diet managed-type 11  . GERD (gastroesophageal reflux disease)   . Heart murmur   . Hiatal hernia with gastroesophageal reflux 1997  . Hyperlipidemia   . Hypertension   .  Neuromuscular disorder ([place])    nerve damage in back and shoulder  . Osteoporosis   . Palpitations    a. 03/2012: 48-hour Holter monitor showed intermittent sinus tachycardia with occasional PACs, without other significant arrhythmia  . Thyroid disease    hyperthyroidism as a teenager - received some injections age 47 that corrected this   . Tobacco abuse     Past Surgical History:  Procedure Laterality Date  . ABDOMINAL HYSTERECTOMY  1993  . ANTERIOR CERVICAL DECOMP/DISCECTOMY FUSION N/A 10/26/2018   Procedure: ACDF C4-C5 C5-C6 C6-C7;  Surgeon: Kary Kos, MD;  Location: [facility];  Service: Neurosurgery;  Laterality: N/A;  . APPENDECTOMY    . BACK SURGERY    . BREAST BIOPSY Right 2007   benign  . CHOLECYSTECTOMY    . COLONOSCOPY     30 years ago was normal per pt.   Marland Kitchen DILATION AND CURETTAGE OF UTERUS    . ESOPHAGEAL DILATION     x 3  . fractured leg Right   . OOPHORECTOMY    . OVARIAN CYST REMOVAL  1972  . SALIVARY GLAND SURGERY    . TONSILLECTOMY     4 yoa  . TOTAL KNEE ARTHROPLASTY Left 12/24/2015   Procedure: LEFT TOTAL KNEE ARTHROPLASTY;  Surgeon: [name], MD;  Location: [facility];  Service: Orthopedics;  Laterality: Left;  . TOTAL KNEE ARTHROPLASTY Right 12/30/2017   Procedure: RIGHT TOTAL KNEE ARTHROPLASTY;  Surgeon: [name], MD;  Location: [facility];  Service: Orthopedics;  Laterality: Right;  . WRIST FRACTURE SURGERY  11/2008     Current Outpatient Medications  Medication Sig Dispense Refill  . acetaminophen (TYLENOL) 650 MG CR tablet Take 650 mg by mouth 3 times/day as needed-between meals & bedtime (for headaches).    Marland Kitchen albuterol (PROVENTIL HFA;VENTOLIN HFA) 108 (90 Base) MCG/ACT inhaler Inhale 2 puffs into the lungs every 4 (four) hours as needed for wheezing or shortness of breath. 1 Inhaler 5  . amitriptyline (ELAVIL) 50 MG tablet TAKE 2 TABLETS BY MOUTH DAILY AT BEDTIME. 180 tablet 1  . Ascorbic Acid (VITAMIN C) 1000 MG tablet Take 1,000 mg by mouth  daily.    Marland Kitchen aspirin EC 81 MG tablet Take 81 mg by mouth daily.    Marland Kitchen buPROPion (WELLBUTRIN XL) 150 MG 24 hr tablet TAKE 1 TABLET (150 MG TOTAL) BY MOUTH DAILY. 90 tablet 1  . calcium carbonate (TUMS - DOSED IN MG ELEMENTAL CALCIUM) 500 MG chewable tablet Chew 2 tablets by mouth daily as needed for indigestion or heartburn.    . Cholecalciferol (VITAMIN D) 1000 UNITS capsule Take 1,000 Units by mouth daily.     . clonazePAM (KLONOPIN) 0.5 MG tablet Take 1 tablet (0.5 mg total) by mouth 2 (two) times daily as needed. 30 tablet 5  . cyclobenzaprine (FLEXERIL) 10 MG tablet TAKE 1 TABLET THREE TIMES DAILY AS NEEDED FOR MUSCLE SPAMS 50 tablet 2  . FLUoxetine (PROZAC) 40 MG capsule TAKE 1 CAPSULE (40 MG TOTAL) BY MOUTH DAILY. 90 capsule 1  . metoprolol tartrate (LOPRESSOR) 25 MG tablet Take 1/2 (one-half) tablet by mouth twice daily 90 tablet 3  . Multiple Vitamin (MULTIVITAMIN) tablet Take 1 tablet by mouth daily.    . naproxen sodium (ALEVE) 220 MG tablet Take 220 mg by mouth daily.    . pantoprazole (PROTONIX) 40 MG tablet TAKE ONE TABLET BY MOUTH 30 MINUTES PRIOR TO BREAKFAST 90 tablet 3  . polycarbophil (EQ FIBER THERAPY) 625 MG tablet Take 625 mg by mouth daily. FIBER THERAPY    . rosuvastatin (CRESTOR) 40 MG tablet TAKE 1 TABLET (40 MG TOTAL) BY MOUTH DAILY. 90 tablet 3  . Simethicone 125 MG CAPS Take 125 mg by mouth daily as needed (gas).    . ezetimibe (ZETIA) 10 MG tablet Take 1 tablet (10 mg total) by mouth daily. 90 tablet 3   No current facility-administered medications for this visit.    Allergies:   Diazepam    Social History:  The patient  reports that she has been smoking cigarettes. She has a 33.75 pack-year smoking history. She has never used smokeless tobacco. She reports current alcohol use. She reports that she does not use drugs.   Family History:  The patient's family history includes Arthritis in her mother; Breast cancer in her sister; Cancer in her paternal grandmother;  Colon polyps in her mother and sister; Diabetes in her mother; Heart disease in her mother; Hypercholesterolemia in her sister; Hyperlipidemia in her mother.    ROS:  Please see the history of present illness.   Otherwise, review of systems are positive for none.   All other systems are reviewed and negative.    PHYSICAL EXAM: VS:  BP 140/70 (BP Location: Right Arm, Patient Position: Sitting, Cuff Size: Normal)   Pulse 82   Ht 5\' 7"  (1.702 m)   Wt 221 lb 8 oz (100.5 kg)   SpO2 99%   BMI  34.69 kg/m  , BMI Body mass index is 34.69 kg/m. GEN: Well nourished, well developed, in no acute distress  HEENT: normal  Neck: no JVD, carotid bruits, or masses Cardiac: RRR; no  rubs, or gallops,no edema . 2/6 crescendo decrescendo systolic murmur in the aortic area which is  mid peaking. Respiratory:  clear to auscultation bilaterally, normal work of breathing GI: soft, nontender, nondistended, + BS MS: no deformity or atrophy  Skin: warm and dry, no rash Neuro:  Strength and sensation are intact Psych: euthymic mood, full affect   EKG:  EKG is not ordered today. Recent EKG was reviewed which showed sinus rhythm with  right bundle branch block.   Recent Labs: 08/08/2020: ALT 12; BUN 11; Creatinine, Ser 0.81; Hemoglobin 12.9; Platelets 191.0; Potassium 4.3; Sodium 139    Lipid Panel    Component Value Date/Time   CHOL 156 08/08/2020 1021   CHOL 201 (H) 09/18/2013 1016   TRIG 104.0 08/08/2020 1021   HDL 49.50 08/08/2020 1021   HDL 53 09/18/2013 1016   CHOLHDL 3 08/08/2020 1021   VLDL 20.8 08/08/2020 1021   LDLCALC 85 08/08/2020 1021   LDLCALC 130 (H) 09/18/2013 1016   LDLDIRECT 209.2 05/28/2009 0909      Wt Readings from Last 3 Encounters:  11/08/20 221 lb 8 oz (100.5 kg)  08/15/20 224 lb 8 oz (101.8 kg)  04/17/20 229 lb 8 oz (104.1 kg)         ASSESSMENT AND PLAN:  1.  Moderate nonobstructive coronary artery disease with other forms of angina: She reports significant  worsening of exertional dyspnea which is concerning for angina equivalent given known history of moderate coronary artery disease on previous CTA in 2019.  We need to exclude progression of coronary artery disease.  Recommend evaluation with a Lexiscan Myoview.  She is not able to exercise on a treadmill.  Continue low-dose aspirin.    2.  Aortic stenosis: This continues to be moderate with minimal progression in mean gradient.  The plan is to repeat echocardiogram in 1 to 2 years.    3.  Palpitations: Previous mild PACs. Symptoms are well-controlled on small dose metoprolol.  4 . Tobacco use: I again discussed with her the importance of smoking cessation.  5.  Hyperlipidemia: Continue high-dose rosuvastatin.  Most recent lipid profile showed an LDL of 85.  Given known coronary artery disease, recommend a target LDL of less than 70.  I elected to add Zetia 10 mg daily.  Repeat lipid and liver profile in 2 months.   Disposition:   FU with me in 6 months  Signed,  Kathlyn Sacramento, MD  11/08/2020 9:27 AM    [place]

## 2020-11-08 NOTE — Patient Instructions (Addendum)
Medication Instructions:  Your physician has recommended you make the following change in your medication:   START Zetia 10 mg daily. An Rx has been sent to your pharmacy.  *If you need a refill on your cardiac medications before your next appointment, please call your pharmacy*   Lab Work: Your physician recommends that you return for a FASTING lipid profile and liver panel in 2 months.  Please have your labs drawn at the [facility] medical mall. Lab hours are Mon-Fri 7am-6pm. You do not need an appt.  If you have labs (blood work) drawn today and your tests are completely normal, you will receive your results only by: Marland Kitchen MyChart Message (if you have MyChart) OR . A paper copy in the mail If you have any lab test that is abnormal or we need to change your treatment, we will call you to review the results.   Testing/Procedures: Your physician has requested that you have a lexiscan myoview. For further information please visit [email or web address]. Please follow instruction sheet, as given.     Follow-Up: At [facility], you and your health needs are our priority.  As part of our continuing mission to provide you with exceptional heart care, we have created designated Provider Care Teams.  These Care Teams include your primary Cardiologist (physician) and Advanced Practice Providers (APPs -  Physician Assistants and Nurse Practitioners) who all work together to provide you with the care you need, when you need it.  We recommend signing up for the patient portal called "MyChart".  Sign up information is provided on this After Visit Summary.  MyChart is used to connect with patients for Virtual Visits (Telemedicine).  Patients are able to view lab/test results, encounter notes, upcoming appointments, etc.  Non-urgent messages can be sent to your provider as well.   To learn more about what you can do with MyChart, go to [email or web address].    Your next appointment:   6 month(s)  The  format for your next appointment:   In Person  Provider:   You may see Dr.Arida or one of the following Advanced Practice Providers on your designated Care Team:    Murray Hodgkins, NP  Christell Faith, PA-C  Marrianne Mood, PA-C  Cadence [place], [place]  Laurann Montana, NP    Other Instructions [place]  Your caregiver has ordered a Stress Test with nuclear imaging. The purpose of this test is to evaluate the blood supply to your heart muscle. This procedure is referred to as a "Non-Invasive Stress Test." This is because other than having an IV started in your vein, nothing is inserted or "invades" your body. Cardiac stress tests are done to find areas of poor blood flow to the heart by determining the extent of coronary artery disease (CAD). Some patients exercise on a treadmill, which naturally increases the blood flow to your heart, while others who are  unable to walk on a treadmill due to physical limitations have a pharmacologic/chemical stress agent called Lexiscan . This medicine will mimic walking on a treadmill by temporarily increasing your coronary blood flow.   Please note: these test may take anywhere between 2-4 hours to complete  PLEASE REPORT TO [facility] AT THE FIRST DESK WILL DIRECT YOU WHERE TO GO  Date of Procedure:_____________________________________  Arrival Time for Procedure:______________________________  Instructions regarding medication:     __X__:  Hold betablocker(Metoprolol) night before procedure and morning of procedure   PLEASE NOTIFY THE OFFICE AT LEAST 24  HOURS IN ADVANCE IF YOU ARE UNABLE TO KEEP YOUR APPOINTMENT.  [phone] AND  PLEASE NOTIFY NUCLEAR MEDICINE AT [facility] AT LEAST 24 HOURS IN ADVANCE IF YOU ARE UNABLE TO KEEP YOUR APPOINTMENT. [phone]  How to prepare for your Myoview test:  1. Do not eat or drink after midnight 2. No caffeine for 24 hours prior to test 3. No smoking 24 hours prior  to test. 4. Your medication may be taken with water.  If your doctor stopped a medication because of this test, do not take that medication. 5. Ladies, please do not wear dresses.  Skirts or pants are appropriate. Please wear a short sleeve shirt. 6. No perfume or lotion. 7. Wear comfortable walking shoes. No heels!

## 2020-11-10 ENCOUNTER — Other Ambulatory Visit: Payer: Self-pay

## 2020-11-10 ENCOUNTER — Emergency Department: Payer: Medicare Other

## 2020-11-10 ENCOUNTER — Encounter: Payer: Self-pay | Admitting: Emergency Medicine

## 2020-11-10 DIAGNOSIS — Z7982 Long term (current) use of aspirin: Secondary | ICD-10-CM | POA: Diagnosis not present

## 2020-11-10 DIAGNOSIS — F17210 Nicotine dependence, cigarettes, uncomplicated: Secondary | ICD-10-CM | POA: Diagnosis not present

## 2020-11-10 DIAGNOSIS — R0602 Shortness of breath: Secondary | ICD-10-CM | POA: Diagnosis present

## 2020-11-10 DIAGNOSIS — I10 Essential (primary) hypertension: Secondary | ICD-10-CM | POA: Diagnosis not present

## 2020-11-10 DIAGNOSIS — Z79899 Other long term (current) drug therapy: Secondary | ICD-10-CM | POA: Insufficient documentation

## 2020-11-10 DIAGNOSIS — E119 Type 2 diabetes mellitus without complications: Secondary | ICD-10-CM | POA: Insufficient documentation

## 2020-11-10 DIAGNOSIS — R079 Chest pain, unspecified: Secondary | ICD-10-CM | POA: Diagnosis not present

## 2020-11-10 DIAGNOSIS — R0789 Other chest pain: Secondary | ICD-10-CM | POA: Diagnosis not present

## 2020-11-10 DIAGNOSIS — J449 Chronic obstructive pulmonary disease, unspecified: Secondary | ICD-10-CM | POA: Insufficient documentation

## 2020-11-10 DIAGNOSIS — Z20822 Contact with and (suspected) exposure to covid-19: Secondary | ICD-10-CM | POA: Diagnosis not present

## 2020-11-10 DIAGNOSIS — I200 Unstable angina: Principal | ICD-10-CM | POA: Insufficient documentation

## 2020-11-10 DIAGNOSIS — Z743 Need for continuous supervision: Secondary | ICD-10-CM | POA: Diagnosis not present

## 2020-11-10 LAB — CBC
HCT: 37.2 % (ref 36.0–46.0)
Hemoglobin: 12.5 g/dL (ref 12.0–15.0)
MCH: 34.1 pg — ABNORMAL HIGH (ref 26.0–34.0)
MCHC: 33.6 g/dL (ref 30.0–36.0)
MCV: 101.4 fL — ABNORMAL HIGH (ref 80.0–100.0)
Platelets: 183 10*3/uL (ref 150–400)
RBC: 3.67 MIL/uL — ABNORMAL LOW (ref 3.87–5.11)
RDW: 11.3 % — ABNORMAL LOW (ref 11.5–15.5)
WBC: 5.6 10*3/uL (ref 4.0–10.5)
nRBC: 0 % (ref 0.0–0.2)

## 2020-11-10 LAB — BASIC METABOLIC PANEL
Anion gap: 8 (ref 5–15)
BUN: 6 mg/dL — ABNORMAL LOW (ref 8–23)
CO2: 26 mmol/L (ref 22–32)
Calcium: 9.4 mg/dL (ref 8.9–10.3)
Chloride: 105 mmol/L (ref 98–111)
Creatinine, Ser: 0.78 mg/dL (ref 0.44–1.00)
GFR, Estimated: >60 mL/min (ref >60)
Glucose, Bld: 94 mg/dL (ref 70–99)
Potassium: 3.8 mmol/L (ref 3.5–5.1)
Sodium: 139 mmol/L (ref 135–145)

## 2020-11-10 LAB — TROPONIN I (HIGH SENSITIVITY): Troponin I (High Sensitivity): 4 ng/L (ref <18)

## 2020-11-10 NOTE — ED Triage Notes (Signed)
Pt to ED via EMS from home c/o mid non radiating chest pain since yesterday, SOB, non productive cough.  Pt took one of husband's nitro yesterday with relief, took total of 3 nitro today and 324 ASA with some relief.  Pt seen by Dr. Sophronia Simas cardiologist.  Pt scheduled for stress test next week with worry about blockages.  Pt A&Ox4, chest rise even and unlabored, skin WNL.

## 2020-11-10 NOTE — ED Triage Notes (Signed)
First Nurse: patient brought in by ems from home. Patient with complaint of chest tightness that started yesterday. Patient took one of her husband's nitro tablets with relief. Patient started having chest discomfort again today and took 2 of her husbands nitro and 324 mg of asa with relief. Patient is scheduled for a stress test next week. EMS vs 168/84, SR 68, and blood glucose 124.

## 2020-11-11 ENCOUNTER — Observation Stay
Admission: EM | Admit: 2020-11-11 | Discharge: 2020-11-11 | Disposition: A | Discharge status: Home / Self Care | Payer: Medicare Other | Attending: Internal Medicine | Admitting: Internal Medicine

## 2020-11-11 ENCOUNTER — Encounter
Admission: EM | Disposition: A | Discharge status: Home / Self Care | Payer: Self-pay | Source: Home / Self Care | Attending: Internal Medicine

## 2020-11-11 ENCOUNTER — Encounter: Payer: Self-pay | Admitting: Cardiovascular Disease

## 2020-11-11 ENCOUNTER — Telehealth: Payer: Self-pay | Admitting: Cardiovascular Disease

## 2020-11-11 DIAGNOSIS — F419 Anxiety disorder, unspecified: Secondary | ICD-10-CM | POA: Diagnosis not present

## 2020-11-11 DIAGNOSIS — Z720 Tobacco use: Secondary | ICD-10-CM | POA: Diagnosis not present

## 2020-11-11 DIAGNOSIS — I350 Nonrheumatic aortic (valve) stenosis: Secondary | ICD-10-CM

## 2020-11-11 DIAGNOSIS — I10 Essential (primary) hypertension: Secondary | ICD-10-CM

## 2020-11-11 DIAGNOSIS — K219 Gastro-esophageal reflux disease without esophagitis: Secondary | ICD-10-CM

## 2020-11-11 DIAGNOSIS — F32A Depression, unspecified: Secondary | ICD-10-CM | POA: Diagnosis not present

## 2020-11-11 DIAGNOSIS — I200 Unstable angina: Secondary | ICD-10-CM

## 2020-11-11 DIAGNOSIS — I25110 Atherosclerotic heart disease of native coronary artery with unstable angina pectoris: Secondary | ICD-10-CM

## 2020-11-11 DIAGNOSIS — E785 Hyperlipidemia, unspecified: Secondary | ICD-10-CM | POA: Diagnosis not present

## 2020-11-11 DIAGNOSIS — I249 Acute ischemic heart disease, unspecified: Secondary | ICD-10-CM | POA: Diagnosis present

## 2020-11-11 HISTORY — PX: LEFT HEART CATH AND CORONARY ANGIOGRAPHY: CATH118249

## 2020-11-11 LAB — PROTIME-INR
INR: 1 (ref 0.8–1.2)
Prothrombin Time: 13.1 seconds (ref 11.4–15.2)

## 2020-11-11 LAB — GLUCOSE, CAPILLARY: Glucose-Capillary: 114 mg/dL — ABNORMAL HIGH (ref 70–99)

## 2020-11-11 LAB — TROPONIN I (HIGH SENSITIVITY): Troponin I (High Sensitivity): 3 ng/L (ref <18)

## 2020-11-11 LAB — RESP PANEL BY RT-PCR (FLU A&B, COVID) ARPGX2
Influenza A by PCR: NEGATIVE
Influenza B by PCR: NEGATIVE
SARS Coronavirus 2 by RT PCR: NEGATIVE

## 2020-11-11 LAB — APTT: aPTT: 28 seconds (ref 24–36)

## 2020-11-11 SURGERY — LEFT HEART CATH AND CORONARY ANGIOGRAPHY
Anesthesia: Moderate Sedation

## 2020-11-11 MED ORDER — NITROGLYCERIN 0.4 MG SL SUBL: 0.4000 mg | SUBLINGUAL_TABLET | SUBLINGUAL | Status: DC | PRN

## 2020-11-11 MED ORDER — ATORVASTATIN CALCIUM 80 MG PO TABS: 80.0000 mg | ORAL_TABLET | Freq: Every day | ORAL | Status: DC

## 2020-11-11 MED ORDER — PANTOPRAZOLE SODIUM 40 MG PO TBEC: 40.0000 mg | DELAYED_RELEASE_TABLET | Freq: Every day | ORAL | Status: DC

## 2020-11-11 MED ORDER — FLUOXETINE HCL 20 MG PO CAPS: 40.0000 mg | ORAL_CAPSULE | Freq: Every day | ORAL | Status: DC

## 2020-11-11 MED ORDER — HEPARIN (PORCINE) IN NACL 1000-0.9 UT/500ML-% IV SOLN
INTRAVENOUS | Status: AC
  Filled 2020-11-11: qty 1000

## 2020-11-11 MED ORDER — ASCORBIC ACID 500 MG PO TABS: 1000.0000 mg | ORAL_TABLET | Freq: Every day | ORAL | Status: DC

## 2020-11-11 MED ORDER — SODIUM CHLORIDE 0.9 % IV SOLN: INTRAVENOUS | Status: DC

## 2020-11-11 MED ORDER — HEPARIN (PORCINE) 25000 UT/250ML-% IV SOLN
1100.0000 [IU]/h | INTRAVENOUS | Status: DC
  Administered 2020-11-11: 1100 [IU]/h via INTRAVENOUS
  Filled 2020-11-11: qty 250

## 2020-11-11 MED ORDER — BUPROPION HCL ER (XL) 150 MG PO TB24: 150.0000 mg | ORAL_TABLET | Freq: Every day | ORAL | Status: DC

## 2020-11-11 MED ORDER — MIDAZOLAM HCL 2 MG/2ML IJ SOLN
INTRAMUSCULAR | Status: AC
  Filled 2020-11-11: qty 2

## 2020-11-11 MED ORDER — VERAPAMIL HCL 2.5 MG/ML IV SOLN
INTRAVENOUS | Status: AC
  Filled 2020-11-11: qty 2

## 2020-11-11 MED ORDER — VERAPAMIL HCL 2.5 MG/ML IV SOLN
INTRAVENOUS | Status: DC | PRN
  Administered 2020-11-11: 2.5 mg via INTRA_ARTERIAL

## 2020-11-11 MED ORDER — ROSUVASTATIN CALCIUM 20 MG PO TABS: 40.0000 mg | ORAL_TABLET | Freq: Every day | ORAL | Status: DC

## 2020-11-11 MED ORDER — ASPIRIN EC 81 MG PO TBEC: 81.0000 mg | DELAYED_RELEASE_TABLET | Freq: Every day | ORAL | Status: DC

## 2020-11-11 MED ORDER — ADULT MULTIVITAMIN W/MINERALS CH: 1.0000 | ORAL_TABLET | Freq: Every day | ORAL | Status: DC

## 2020-11-11 MED ORDER — SODIUM CHLORIDE 0.9 % IV SOLN: 250.0000 mL | INTRAVENOUS | Status: DC | PRN

## 2020-11-11 MED ORDER — SODIUM CHLORIDE 0.9% FLUSH: 3.0000 mL | INTRAVENOUS | Status: DC | PRN

## 2020-11-11 MED ORDER — SODIUM CHLORIDE 0.9% FLUSH: 3.0000 mL | Freq: Two times a day (BID) | INTRAVENOUS | Status: DC

## 2020-11-11 MED ORDER — VITAMIN D 25 MCG (1000 UNIT) PO TABS: 1000.0000 [IU] | ORAL_TABLET | Freq: Every day | ORAL | Status: DC

## 2020-11-11 MED ORDER — NITROGLYCERIN 2 % TD OINT: 0.5000 [in_us] | TOPICAL_OINTMENT | Freq: Four times a day (QID) | TRANSDERMAL | Status: DC

## 2020-11-11 MED ORDER — ALBUTEROL SULFATE HFA 108 (90 BASE) MCG/ACT IN AERS
2.0000 | INHALATION_SPRAY | RESPIRATORY_TRACT | Status: DC | PRN
  Filled 2020-11-11: qty 6.7

## 2020-11-11 MED ORDER — IOHEXOL 300 MG/ML  SOLN
INTRAMUSCULAR | Status: DC | PRN
  Administered 2020-11-11: 50 mL

## 2020-11-11 MED ORDER — FENTANYL CITRATE (PF) 100 MCG/2ML IJ SOLN
INTRAMUSCULAR | Status: DC | PRN
  Administered 2020-11-11: 50 ug via INTRAVENOUS

## 2020-11-11 MED ORDER — HEPARIN SODIUM (PORCINE) 1000 UNIT/ML IJ SOLN
INTRAMUSCULAR | Status: DC | PRN
  Administered 2020-11-11: 5000 [IU] via INTRAVENOUS

## 2020-11-11 MED ORDER — CYCLOBENZAPRINE HCL 10 MG PO TABS: 10.0000 mg | ORAL_TABLET | Freq: Three times a day (TID) | ORAL | Status: DC | PRN

## 2020-11-11 MED ORDER — FENTANYL CITRATE (PF) 100 MCG/2ML IJ SOLN
INTRAMUSCULAR | Status: AC
  Filled 2020-11-11: qty 2

## 2020-11-11 MED ORDER — ONDANSETRON HCL 4 MG/2ML IJ SOLN: 4.0000 mg | Freq: Four times a day (QID) | INTRAMUSCULAR | Status: DC | PRN

## 2020-11-11 MED ORDER — INSULIN ASPART 100 UNIT/ML ~~LOC~~ SOLN: 0.0000 [IU] | Freq: Three times a day (TID) | SUBCUTANEOUS | Status: DC

## 2020-11-11 MED ORDER — HEPARIN BOLUS VIA INFUSION
4000.0000 [IU] | Freq: Once | INTRAVENOUS | Status: AC
  Administered 2020-11-11: 4000 [IU] via INTRAVENOUS
  Filled 2020-11-11: qty 4000

## 2020-11-11 MED ORDER — HEPARIN SODIUM (PORCINE) 1000 UNIT/ML IJ SOLN
INTRAMUSCULAR | Status: AC
  Filled 2020-11-11: qty 1

## 2020-11-11 MED ORDER — ZOLPIDEM TARTRATE 5 MG PO TABS: 5.0000 mg | ORAL_TABLET | Freq: Every evening | ORAL | Status: DC | PRN

## 2020-11-11 MED ORDER — SODIUM CHLORIDE 0.9 % WEIGHT BASED INFUSION: 1.0000 mL/kg/h | INTRAVENOUS | Status: DC

## 2020-11-11 MED ORDER — CALCIUM CARBONATE ANTACID 500 MG PO CHEW: 2.0000 | CHEWABLE_TABLET | Freq: Every day | ORAL | Status: DC | PRN

## 2020-11-11 MED ORDER — ACETAMINOPHEN 325 MG PO TABS: 650.0000 mg | ORAL_TABLET | ORAL | Status: DC | PRN

## 2020-11-11 MED ORDER — HEPARIN (PORCINE) IN NACL 1000-0.9 UT/500ML-% IV SOLN
INTRAVENOUS | Status: DC | PRN
  Administered 2020-11-11: 500 mL

## 2020-11-11 MED ORDER — SODIUM CHLORIDE 0.9 % WEIGHT BASED INFUSION
3.0000 mL/kg/h | INTRAVENOUS | Status: DC
  Administered 2020-11-11: 3 mL/kg/h via INTRAVENOUS

## 2020-11-11 MED ORDER — METOPROLOL TARTRATE 25 MG PO TABS: 12.5000 mg | ORAL_TABLET | Freq: Two times a day (BID) | ORAL | Status: DC

## 2020-11-11 MED ORDER — EZETIMIBE 10 MG PO TABS
10.0000 mg | ORAL_TABLET | Freq: Every day | ORAL | Status: DC
  Filled 2020-11-11: qty 1

## 2020-11-11 MED ORDER — CLONAZEPAM 0.5 MG PO TABS: 0.5000 mg | ORAL_TABLET | Freq: Two times a day (BID) | ORAL | Status: DC | PRN

## 2020-11-11 SURGICAL SUPPLY — 11 items
CATH BALLN WEDGE 5F 110CM (CATHETERS) ×2 IMPLANT
CATH INFINITI 5 FR JL3.5 (CATHETERS) IMPLANT
CATH INFINITI 5FR JK (CATHETERS) ×2 IMPLANT
DEVICE RAD COMP TR BAND LRG (VASCULAR PRODUCTS) IMPLANT
DEVICE RAD TR BAND REGULAR (VASCULAR PRODUCTS) ×2 IMPLANT
GLIDESHEATH SLEND SS 6F .021 (SHEATH) ×2 IMPLANT
GUIDEWIRE INQWIRE 1.5J.035X260 (WIRE) ×1 IMPLANT
INQWIRE 1.5J .035X260CM (WIRE) ×2
KIT MANI 3VAL PERCEP (MISCELLANEOUS) ×2 IMPLANT
PACK CARDIAC CATH (CUSTOM PROCEDURE TRAY) ×2 IMPLANT
SHEATH GLIDE SLENDER 4/5FR (SHEATH) ×2 IMPLANT

## 2020-11-11 NOTE — Consult Note (Signed)
Cardiology Consultation:   Patient ID: Amanda [name]; [id number]; Oct 13, 1951   Admit date: 11/11/2020 Date of Consult: 11/11/2020  Primary Care Provider: Owens Loffler, MD Primary Cardiologist: Fletcher Anon Primary Electrophysiologist:  None   Patient Profile:   Amanda [name] is a 69 y.o. female with a hx of mild to moderate nonobstructive CAD by coronary CTA in 08/2018, moderate aortic stenosis, COPD, diet-controlled diabetes, tobacco use, HTN, and HLD who is being seen today for the evaluation of unstable angina at the request of Dr. Sidney Ace.  History of Present Illness:   Amanda [name] underwent CTA of the coronary arteries in 08/2018 which showed a calcium score 521 with moderate nonobstructive disease involving the LAD and RCA.  These were not significant by FFR.  Most recent echo from 11/07/2020 showed an EF of 60 to 71%, grade 1 diastolic dysfunction, moderate aortic stenosis with a mean gradient of 22 mmHg and a valve area of 1.29 cm.  She was most recently seen in the office on 11/08/2020 reporting worsening exertional dyspnea over the preceding 6 months which was currently occurring with minimal activities.  She continues to smoke 1/2 pack of cigarettes per day.  She noted occasional substernal chest pain that radiated to her back which usually responded to Flexeril.  Recommendation was for Lexiscan MPI which was scheduled for 11/18/2020.  Over the past 48 hours, she has had worsening substernal chest pressure/tightness that is associated with minimal activity and responds to her husband's SL NTG. Pain gets worse with continued exertion. No rest angina. Pain at its worst over the past 2 days was rated a 12-13/10 and described as a building pressure that was substernal and radiated across her bilateral chest. There was some associated nausea and SOB. She did need to take 2 SL NTG on 12/5 for resolution of her pain.   Upon the patient's arrival to [facility] they were found to have BP 245Y to  099I systolic, HR 33A to 25K bpm, temp afebrile, oxygen saturation 99% on room air, weight documented at 100.2 kg. EKG showed sinus rhythm with known right bundle, CXR showed no acute cardiopulmonary abnormality with chronic hyperinflation with bronchitic features and interstitial changes similar to prior imaging as well as a stable left basilar diaphragmatic eventration/hernia. Labs showed high-sensitivity troponin 4 with a delta troponin of 3, Covid and influenza negative, Hgb 12.5, PLT 183, potassium 3.8, BUN 6, serum creatinine 0.70.  In the ED she was placed on heparin infusion.  Upon admission she was continued on home meds and cardiology was asked to evaluate.  Currently, she notes 1-2/10 chest tightness.  Past Medical History:  Diagnosis Date  . 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 sting    bee stings  . Anxiety   . Aortic stenosis    a. 2013: nl LV sys fxn, mild MR, no evidence of pulm htn; b. TTE 8/17: EF 60-65%, no RWMA, nl LV dia fxn,  mild AS, mod AI  . COPD (chronic obstructive pulmonary disease) ([place]) 10/30/2016  . Degenerative arthritis of knee, bilateral 06/16/2015  . Depression   . Diabetes ([place])    diet managed-type 11  . GERD (gastroesophageal reflux disease)   . Heart murmur   . Hiatal hernia with gastroesophageal reflux 1997  . Hyperlipidemia   . Hypertension   . Neuromuscular disorder ([place])    nerve damage in back and shoulder  . Osteoporosis   . Palpitations    a. 03/2012: 48-hour Holter monitor showed intermittent sinus tachycardia with occasional  PACs, without other significant arrhythmia  . Thyroid disease    hyperthyroidism as a teenager - received some injections age 59 that corrected this   . Tobacco abuse     Past Surgical History:  Procedure Laterality Date  . ABDOMINAL HYSTERECTOMY  1993  . ANTERIOR CERVICAL DECOMP/DISCECTOMY FUSION N/A 10/26/2018   Procedure: ACDF C4-C5 C5-C6 C6-C7;  Surgeon: Kary Kos, MD;  Location: [place];  Service: Neurosurgery;  Laterality: N/A;  .  APPENDECTOMY    . BACK SURGERY    . BREAST BIOPSY Right 2007   benign  . CHOLECYSTECTOMY    . COLONOSCOPY     30 years ago was normal per pt.   Marland Kitchen DILATION AND CURETTAGE OF UTERUS    . ESOPHAGEAL DILATION     x 3  . fractured leg Right   . OOPHORECTOMY    . OVARIAN CYST REMOVAL  1972  . SALIVARY GLAND SURGERY    . TONSILLECTOMY     4 yoa  . TOTAL KNEE ARTHROPLASTY Left 12/24/2015   Procedure: LEFT TOTAL KNEE ARTHROPLASTY;  Surgeon: [name], MD;  Location: [place];  Service: Orthopedics;  Laterality: Left;  . TOTAL KNEE ARTHROPLASTY Right 12/30/2017   Procedure: RIGHT TOTAL KNEE ARTHROPLASTY;  Surgeon: [name], MD;  Location: [place];  Service: Orthopedics;  Laterality: Right;  . WRIST FRACTURE SURGERY  11/2008     Home Meds: Prior to Admission medications   Medication Sig Start Date End Date Taking? Authorizing Provider  acetaminophen (TYLENOL) 650 MG CR tablet Take 650 mg by mouth 3 times/day as needed-between meals & bedtime (for headaches).   Yes [provider]  albuterol (PROVENTIL HFA;VENTOLIN HFA) 108 (90 Base) MCG/ACT inhaler Inhale 2 puffs into the lungs every 4 (four) hours as needed for wheezing or shortness of breath. 07/27/18  Yes Copland, Frederico Hamman, MD  amitriptyline (ELAVIL) 50 MG tablet TAKE 2 TABLETS BY MOUTH DAILY AT BEDTIME. 08/20/20  Yes Copland, Frederico Hamman, MD  Ascorbic Acid (VITAMIN C) 1000 MG tablet Take 1,000 mg by mouth daily.   Yes [provider]  aspirin EC 81 MG tablet Take 81 mg by mouth daily.   Yes [provider]  buPROPion (WELLBUTRIN XL) 150 MG 24 hr tablet TAKE 1 TABLET (150 MG TOTAL) BY MOUTH DAILY. 08/20/20  Yes Copland, Frederico Hamman, MD  calcium carbonate (TUMS - DOSED IN MG ELEMENTAL CALCIUM) 500 MG chewable tablet Chew 2 tablets by mouth daily as needed for indigestion or heartburn.   Yes [provider]  Cholecalciferol (VITAMIN D) 1000 UNITS capsule Take 1,000 Units by mouth daily.    Yes [provider]  clonazePAM (KLONOPIN) 0.5 MG tablet Take 1 tablet (0.5 mg total) by mouth 2 (two) times daily as needed. 10/21/20  Yes Copland, Frederico Hamman, MD  cyclobenzaprine (FLEXERIL) 10 MG tablet TAKE 1 TABLET THREE TIMES DAILY AS NEEDED FOR MUSCLE SPAMS 06/17/20  Yes Copland, Frederico Hamman, MD  FLUoxetine (PROZAC) 40 MG capsule TAKE 1 CAPSULE (40 MG TOTAL) BY MOUTH DAILY. 08/20/20  Yes Copland, Frederico Hamman, MD  metoprolol tartrate (LOPRESSOR) 25 MG tablet Take 1/2 (one-half) tablet by mouth twice daily 11/22/19  Yes Wellington Hampshire, MD  Multiple Vitamin (MULTIVITAMIN) tablet Take 1 tablet by mouth daily.   Yes [provider]  naproxen sodium (ALEVE) 220 MG tablet Take 220 mg by mouth daily.   Yes [provider]  pantoprazole (PROTONIX) 40 MG tablet TAKE ONE TABLET BY MOUTH 30 MINUTES PRIOR TO BREAKFAST 11/28/19  Yes Copland, Spencer, MD  polycarbophil (EQ FIBER THERAPY) 625 MG tablet Take 625 mg by mouth daily as needed. FIBER THERAPY    Yes [provider]  rosuvastatin (CRESTOR) 40 MG tablet TAKE 1 TABLET (40 MG TOTAL) BY MOUTH DAILY. 04/25/20  Yes Copland, Frederico Hamman, MD  Simethicone 125 MG CAPS Take 125 mg by mouth daily as needed (gas).   Yes [provider]  ezetimibe (ZETIA) 10 MG tablet Take 1 tablet (10 mg total) by mouth daily. 11/08/20 02/06/21  Wellington Hampshire, MD    Inpatient Medications: Scheduled Meds: . vitamin C  1,000 mg Oral Daily  . aspirin EC  81 mg Oral Daily  . [START ON 11/12/2020] aspirin EC  81 mg Oral Daily  . buPROPion  150 mg Oral Daily  . ezetimibe  10 mg Oral Daily  . FLUoxetine  40 mg Oral Daily  . insulin aspart  0-9 Units Subcutaneous TID WC  . metoprolol tartrate  12.5 mg Oral BID  . multivitamin  1 tablet Oral Daily  . nitroGLYCERIN  0.5 inch Topical Q6H  . pantoprazole  40 mg Oral Daily  . rosuvastatin  40 mg Oral Daily  . Vitamin D  1,000 Units Oral Daily   Continuous Infusions: . sodium chloride    . heparin 1,100 Units/hr  (11/11/20 0506)   PRN Meds: acetaminophen, albuterol, calcium carbonate, clonazePAM, cyclobenzaprine, nitroGLYCERIN, ondansetron (ZOFRAN) IV, zolpidem  Allergies:   Allergies  Allergen Reactions  . Honey Bee Venom [Bee Venom] Anaphylaxis  . Diazepam Other (See Comments)    REACTION: pt states she gets very angry and abusive verbally on this medication     Social History:   Social History   Socioeconomic History  . Marital status: Married    Spouse name: Not on file  . Number of children: 2  . Years of education: Not on file  . Highest education level: Not on file  Occupational History  . Occupation: Chief Executive Officer: [employer]  Tobacco Use  . Smoking status: Current Every Day Smoker    Packs/day: 0.75    Years: 45.00    Pack years: 33.75    Types: Cigarettes  . Smokeless tobacco: Never Used  . Tobacco comment: trying to quit before knee surgery  Vaping Use  . Vaping Use: Never used  Substance and Sexual Activity  . Alcohol use: Yes    Alcohol/week: 0.0 standard drinks    Comment: glass of wine twice a month   . Drug use: No  . Sexual activity: Not Currently  Other Topics Concern  . Not on file  Social History Narrative  . Not on file   Social Determinants of Health   Financial Resource Strain: Low Risk   . Difficulty of Paying Living Expenses: Not very hard  Food Insecurity:   . Worried About Charity fundraiser in the Last Year: Not on file  . Ran Out of Food in the Last Year: Not on file  Transportation Needs:   . Lack of Transportation (Medical): Not on file  . Lack of Transportation (Non-Medical): Not on file  Physical Activity:   . Days of Exercise per Week: Not on file  . Minutes of Exercise per Session: Not on file  Stress:   . Feeling of Stress : Not on file  Social Connections:   . Frequency of Communication with Friends and Family: Not on file  . Frequency of Social Gatherings with Friends and  Family: Not on file  . Attends Religious Services: Not on file  . Active Member of Clubs or Organizations: Not on file  . Attends Archivist Meetings: Not on file  . Marital Status: Not on file  Intimate Partner Violence:   . Fear of Current or Ex-Partner: Not on file  . Emotionally Abused: Not on file  . Physically Abused: Not on file  . Sexually Abused: Not on file     Family History:   Family History  Problem Relation Age of Onset  . Hyperlipidemia Mother   . Arthritis Mother   . Diabetes Mother   . Heart disease Mother   . Colon polyps Mother   . Cancer Paternal Grandmother        breast  . Hypercholesterolemia Sister   . Colon polyps Sister   . Breast cancer Sister   . Colon cancer Neg Hx   . Esophageal cancer Neg Hx   . Rectal cancer Neg Hx   . Stomach cancer Neg Hx   . Pancreatic cancer Neg Hx     ROS:  Review of Systems  Constitutional: Positive for malaise/fatigue. Negative for chills, diaphoresis, fever and weight loss.  HENT: Negative for congestion.   Eyes: Negative for discharge and redness.  Respiratory: Positive for shortness of breath. Negative for cough, sputum production and wheezing.   Cardiovascular: Positive for chest pain. Negative for palpitations, orthopnea, claudication, leg swelling and PND.  Gastrointestinal: Positive for nausea. Negative for abdominal pain, blood in stool, heartburn, melena and vomiting.  Musculoskeletal: Negative for falls and myalgias.  Skin: Negative for rash.  Neurological: Positive for weakness. Negative for dizziness, tingling, tremors, sensory change, speech change, focal weakness and loss of consciousness.  Endo/Heme/Allergies: Does not bruise/bleed easily.  Psychiatric/Behavioral: Negative for substance abuse. The patient is not nervous/anxious.   All other systems reviewed and are negative.     Physical Exam/Data:   Vitals:   11/11/20 0600 11/11/20 0610 11/11/20 0615 11/11/20 0630  BP:  (!) 154/104   137/89  Pulse: 71 73 69 72  Resp: 17 19 19  (!) 24  Temp:      TempSrc:      SpO2: 100% 96% 98% 98%  Weight:      Height:       No intake or output data in the 24 hours ending 11/11/20 0727 Filed Weights   11/10/20 2100  Weight: 100.2 kg   Body mass index is 34.61 kg/m.   Physical Exam: General: Well developed, well nourished, in no acute distress. Head: Normocephalic, atraumatic, sclera non-icteric, no xanthomas, nares without discharge.  Neck: Negative for carotid bruits. JVD not elevated. Lungs: Clear bilaterally to auscultation without wheezes, rales, or rhonchi. Breathing is unlabored. Heart: RRR with S1 S2. II/VI systolic murmurs along the RUSB radiating to the neck, no rubs, or gallops appreciated. Abdomen: Soft, non-tender, non-distended with normoactive bowel sounds. No hepatomegaly. No rebound/guarding. No obvious abdominal masses. Msk:  Strength and tone appear normal for age. Extremities: No clubbing or cyanosis. No edema. Distal pedal pulses are 2+ and equal bilaterally. Neuro: Alert and oriented X 3. No facial asymmetry. No focal deficit. Moves all extremities spontaneously. Psych:  Responds to questions appropriately with a normal affect.   EKG:  The EKG was personally reviewed and demonstrates: NSR, 76 bpm, RBBB Telemetry:  Telemetry was personally reviewed and demonstrates: SR  Weights: [employer]   11/10/20 2100  Weight: 100.2 kg    Relevant CV Studies:  2D echo  11/08/2019:  1. Left ventricular ejection fraction, by estimation, is 60 to 65%. The  left ventricle has normal function. The left ventricle has no regional  wall motion abnormalities. There is mild to moderate left ventricular  hypertrophy. Left ventricular diastolic  parameters are consistent with Grade I diastolic dysfunction (impaired  relaxation). The average left ventricular global longitudinal strain is  -15.3 %. The global longitudinal strain is abnormal.  2. Right ventricular  systolic function is normal. The right ventricular  size is normal.  3. Left atrial size was mildly dilated.  4. The aortic valve is moderately calcified. Aortic valve regurgitation  is mild. Moderate aortic valve stenosis. Aortic valve area, by VTI  measures 1.29 cm. Aortic valve mean gradient measures 22.2 mmHg. Aortic  valve Vmax measures 3.34 m/s. __________  2D echo 08/2019: 1. The left ventricle has normal systolic function with an ejection  fraction of 60-65%. The cavity size was normal. There is mild concentric  left ventricular hypertrophy. Left ventricular diastolic Doppler  parameters are consistent with impaired  relaxation.  2. The right ventricle has normal systolic function. The cavity was  normal. There is No increase in right ventricular wall thickness.  3. Left atrial size was normal.  4. Right atrial size was normal.  5. The mitral valve is abnormal. mild calcification of the mitral valve  leaflet.  6. The aortic valve is abnormal. Moderate sclerosis of the aortic valve.  Aortic valve regurgitation is mild by color flow Doppler. Mild-moderate  stenosis of the aortic valve. DVI 0.36  7. The aorta is normal unless otherwise noted.  8. The pulmonic valve was not well visualized. Pulmonic valve  regurgitation was not assessed by color flow Doppler. __________  Coronary CTA 08/2018: FINDINGS: Non-cardiac: See separate report from [facility]. No significant findings on limited lung and soft tissue windows.  Calcium score: Dense calcium noted in LAD and RCA  Coronary Arteries: Right dominant with no anomalies  LM: Distal LM less than 50% calcified stenosis  LAD: Ostial and mid LAD less than 50% calcific stenosis  D1: Small vessel less than 30% mixed plaque  D2: Large branching vessel 50% calcific plaque proximally  Circumflex: Less than 50% calcified plaque at ostium  OM1: Small branch without significant disease  RCA: 50% or  less calcific plaque in proximal mid and distal vessel  PDA: Less than 30% calcific plaque  PLA: Less than 30% calcific plaque  IMPRESSION: 1. Calcific aortic atherosclerosis moderate Normal aortic root 2.9 cm  2.  Calcified Aortic Valve suggest TTE correlation for AS  3.  Calcium score 521 which is 85 th percentile for age and sex  83. Moderate non obstructive appearing disease primarily in RCA and LAD as well as D2 Study will be sent for FFR CT __________  2D echo 08/2018: - Left ventricle: The cavity size was at the upper limits of  normal. Wall thickness was increased in a pattern of mild LVH.  Systolic function was normal. The estimated ejection fraction was  in the range of 60% to 65%. Wall motion was normal; there were no  regional wall motion abnormalities. Doppler parameters are  consistent with abnormal left ventricular relaxation (grade 1  diastolic dysfunction).  - Aortic valve: Transvalvular velocity was increased. There was  mild to moderate stenosis. There was mild to moderate  regurgitation. Mean gradient (S): 19 mm Hg. Valve area (VTI):  1.28 cm^2.  - Mitral valve: Calcified annulus.  - Left atrium: The atrium was mildly  dilated.  - Right ventricle: The cavity size was normal. Systolic function  was normal. __________  2D echo 07/2016: - Left ventricle: The cavity size was normal. There was mild focal  basal hypertrophy of the septum. Systolic function was normal.  The estimated ejection fraction was in the range of 60% to 65%.  Wall motion was normal; there were no regional wall motion  abnormalities. Left ventricular diastolic function parameters  were normal for the patient&'s age.  - Aortic valve: Calcified annulus. Probably trileaflet. There was  mild stenosis, based on peak velocity and mean gradient. There  was moderate regurgitation. __________  2D echo 03/2012: - Left ventricle: The cavity size was normal.  There was mild  to moderate concentric hypertrophy. Systolic function was  normal. The estimated ejection fraction was in the range  of 55% to 60%. Wall motion was normal; there were no  regional wall motion abnormalities. Doppler parameters are  consistent with abnormal left ventricular relaxation  (grade 1 diastolic dysfunction).  - Aortic valve: Mild regurgitation.  - Mitral valve: Mild regurgitation.  - Right ventricle: Systolic function was normal.  - Pulmonary arteries: Systolic pressure was within the  normal range.   Laboratory Data:  Chemistry Recent Labs  Lab 11/10/20 2107  NA 139  K 3.8  CL 105  CO2 26  GLUCOSE 94  BUN 6*  CREATININE 0.78  CALCIUM 9.4  GFRNONAA >60  ANIONGAP 8    No results for input(s): PROT, ALBUMIN, AST, ALT, ALKPHOS, BILITOT in the last 168 hours. Hematology Recent Labs  Lab 11/10/20 2107  WBC 5.6  RBC 3.67*  HGB 12.5  HCT 37.2  MCV 101.4*  MCH 34.1*  MCHC 33.6  RDW 11.3*  PLT 183   Cardiac EnzymesNo results for input(s): TROPONINI in the last 168 hours. No results for input(s): TROPIPOC in the last 168 hours.  BNPNo results for input(s): BNP, PROBNP in the last 168 hours.  DDimer No results for input(s): DDIMER in the last 168 hours.  Radiology/Studies:  DG Chest 2 View  Result Date: 11/10/2020 IMPRESSION: 1. No acute cardiopulmonary abnormality. 2. Chronic hyperinflation with bronchitic features and interstitial changes, similar to prior. 3. Stable left basilar diaphragmatic eventration/hernia. Electronically Signed   By: Lovena Le M.D.   On: 11/10/2020 21:19    Assessment and Plan:   1. CAD involving the native coronary arteries with unstable angina: -Currently, notes 1-2/10 substernal chest tightness -HS-Tn negative x 2 with unchanged EKG -ASA -Heparin gtt -Lopressor -Nitropaste -Crestor -Zetia -Given symptoms concerning for unstable angina, plan for LHC today -NPO -Recent echo 11/25/2020, no  indication for repeating at this time -Risks and benefits of cardiac catheterization have been discussed with the patient including risks of bleeding, bruising, infection, kidney damage, stroke, heart attack, urgent need for bypass, injury to a limb, and death. The patient understands these risks and is willing to proceed with the procedure. All questions have been answered and concerns listened to  2. Moderate aortic stenosis: -Overall, stable by echo 11/07/2020 -Assess gradient during cardiac cath -Outpatient follow up  3. Tobacco use: -Complete cessation advised   4. HTN: -Blood pressure improving -Lopressor -Nitropaste   5. HLD:  -LDL 85 from 08/2020 with goal being < 70 -Now on Zetia with Crestor -Recheck fasting lipid panel in 2 months   For questions or updates, please contact [place] Please consult [email or web address] for contact info under Cardiology/STEMI.   Signed, Christell Faith, PA-C [place] Pager: [phone] 11/11/2020, 7:27  AM    

## 2020-11-11 NOTE — CV Procedure (Signed)
Right and left cardiac catheterization was done and showed no significant pulmonary hypertension, stable moderate aortic stenosis and mild to moderate nonobstructive coronary artery disease.  No cardiac culprit is identified for the patient's symptoms. Recommend continuing medical therapy. Full report to follow.

## 2020-11-11 NOTE — Telephone Encounter (Signed)
Patient needs 2-3 week fu app / Amanda [name] from cath today

## 2020-11-11 NOTE — ED Provider Notes (Signed)
[facility] Emergency Department Provider Note  ____________________________________________   First MD Initiated Contact with Patient 11/11/20 [phone]     (approximate)  I have reviewed the triage vital signs and the nursing notes.   HISTORY  Chief Complaint Chest Pain and Shortness of Breath    HPI Amanda [name] is a 69 y.o. female with medical history as listed below which notably includes aortic stenosis, CAD, COPD, diabetes, hypertension, hyperlipidemia, and who is a patient of Dr. Fletcher Anon.  She presents tonight for worsening episodic chest pain and shortness of breath.  Exertion makes her symptoms worse but she came in tonight because she had 3 episodes of severe central and right-sided crushing chest pain and pressure that made it difficult for her to breathe in spite of being at rest.  Each episode lasted at least 30 minutes and only got better when she took one of her husbands nitroglycerin.  She usually has maybe 1 episode a day which is typically milder and usually is associated with exertion.    She saw Dr. Fletcher Anon 2 to 3 days ago in clinic and talk to him about her exertional dyspnea and chest pain.  He is concerned about worsening CAD  and is planning to schedule her for an outpatient nuclear medicine stress test.  However her symptoms got worse before she could follow-up.  She is currently chest pain-free.  At the time of the episodes, her pain is associated with nausea and some sweating.  She denies abdominal pain, fever, sore throat, and any shortness of breath except associated with chest pain.  The symptoms are severe and have been gradually worsening over time but became acute tonight and nitroglycerin made it better, exertion makes it worse.        Past Medical History:  Diagnosis Date  . 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 sting    bee stings  . Anxiety   . Aortic stenosis    a. 2013: nl LV sys fxn, mild MR, no evidence of pulm htn; b. TTE 8/17: EF 60-65%,  no RWMA, nl LV dia fxn,  mild AS, mod AI  . COPD (chronic obstructive pulmonary disease) ([place]) 10/30/2016  . Degenerative arthritis of knee, bilateral 06/16/2015  . Depression   . Diabetes ([place])    diet managed-type 11  . GERD (gastroesophageal reflux disease)   . Heart murmur   . Hiatal hernia with gastroesophageal reflux 1997  . Hyperlipidemia   . Hypertension   . Neuromuscular disorder ([place])    nerve damage in back and shoulder  . Osteoporosis   . Palpitations    a. 03/2012: 48-hour Holter monitor showed intermittent sinus tachycardia with occasional PACs, without other significant arrhythmia  . Thyroid disease    hyperthyroidism as a teenager - received some injections age 63 that corrected this   . Tobacco abuse     Patient Active Problem List   Diagnosis Date Noted  . Myelopathy ([place]) 10/26/2018  . 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 sting   . COPD (chronic obstructive pulmonary disease) ([place]) 10/30/2016  . Gastroesophageal reflux disease 09/17/2016  . Memory loss 10/29/2015  . Primary osteoarthritis of right knee 06/16/2015  . Smoker 05/10/2015  . Solitary pulmonary nodule 03/28/2013  . Hypertension   . Vitamin D deficiency 05/29/2009  . Pure hypercholesterolemia 12/07/2007  . Major depressive disorder, recurrent episode, in partial remission ([place]) 12/07/2007  . Diabetes mellitus type 2, controlled ([place]) 08/10/2007    Past Surgical History:  Procedure Laterality Date  . ABDOMINAL  HYSTERECTOMY  1993  . ANTERIOR CERVICAL DECOMP/DISCECTOMY FUSION N/A 10/26/2018   Procedure: ACDF C4-C5 C5-C6 C6-C7;  Surgeon: Kary Kos, MD;  Location: [place];  Service: Neurosurgery;  Laterality: N/A;  . APPENDECTOMY    . BACK SURGERY    . BREAST BIOPSY Right 2007   benign  . CHOLECYSTECTOMY    . COLONOSCOPY     30 years ago was normal per pt.   Marland Kitchen DILATION AND CURETTAGE OF UTERUS    . ESOPHAGEAL DILATION     x 3  . fractured leg Right   . OOPHORECTOMY    . OVARIAN CYST REMOVAL  1972  . SALIVARY  GLAND SURGERY    . TONSILLECTOMY     4 yoa  . TOTAL KNEE ARTHROPLASTY Left 12/24/2015   Procedure: LEFT TOTAL KNEE ARTHROPLASTY;  Surgeon: [name], MD;  Location: [place];  Service: Orthopedics;  Laterality: Left;  . TOTAL KNEE ARTHROPLASTY Right 12/30/2017   Procedure: RIGHT TOTAL KNEE ARTHROPLASTY;  Surgeon: [name], MD;  Location: [place];  Service: Orthopedics;  Laterality: Right;  . WRIST FRACTURE SURGERY  11/2008    Prior to Admission medications   Medication Sig Start Date End Date Taking? Authorizing Provider  acetaminophen (TYLENOL) 650 MG CR tablet Take 650 mg by mouth 3 times/day as needed-between meals & bedtime (for headaches).    [provider]  albuterol (PROVENTIL HFA;VENTOLIN HFA) 108 (90 Base) MCG/ACT inhaler Inhale 2 puffs into the lungs every 4 (four) hours as needed for wheezing or shortness of breath. 07/27/18   Copland, Frederico Hamman, MD  amitriptyline (ELAVIL) 50 MG tablet TAKE 2 TABLETS BY MOUTH DAILY AT BEDTIME. 08/20/20   Copland, Frederico Hamman, MD  Ascorbic Acid (VITAMIN C) 1000 MG tablet Take 1,000 mg by mouth daily.    [provider]  aspirin EC 81 MG tablet Take 81 mg by mouth daily.    [provider]  buPROPion (WELLBUTRIN XL) 150 MG 24 hr tablet TAKE 1 TABLET (150 MG TOTAL) BY MOUTH DAILY. 08/20/20   Copland, Frederico Hamman, MD  calcium carbonate (TUMS - DOSED IN MG ELEMENTAL CALCIUM) 500 MG chewable tablet Chew 2 tablets by mouth daily as needed for indigestion or heartburn.    [provider]  Cholecalciferol (VITAMIN D) 1000 UNITS capsule Take 1,000 Units by mouth daily.     [provider]  clonazePAM (KLONOPIN) 0.5 MG tablet Take 1 tablet (0.5 mg total) by mouth 2 (two) times daily as needed. 10/21/20   Copland, Frederico Hamman, MD  cyclobenzaprine (FLEXERIL) 10 MG tablet TAKE 1 TABLET THREE TIMES DAILY AS NEEDED FOR MUSCLE SPAMS 06/17/20   Copland, Frederico Hamman, MD  ezetimibe (ZETIA) 10 MG tablet Take 1 tablet (10 mg total) by  mouth daily. 11/08/20 02/06/21  Wellington Hampshire, MD  FLUoxetine (PROZAC) 40 MG capsule TAKE 1 CAPSULE (40 MG TOTAL) BY MOUTH DAILY. 08/20/20   Copland, Frederico Hamman, MD  metoprolol tartrate (LOPRESSOR) 25 MG tablet Take 1/2 (one-half) tablet by mouth twice daily 11/22/19   Wellington Hampshire, MD  Multiple Vitamin (MULTIVITAMIN) tablet Take 1 tablet by mouth daily.    [provider]  naproxen sodium (ALEVE) 220 MG tablet Take 220 mg by mouth daily.    [provider]  pantoprazole (PROTONIX) 40 MG tablet TAKE ONE TABLET BY MOUTH 30 MINUTES PRIOR TO BREAKFAST 11/28/19   Copland, Frederico Hamman, MD  polycarbophil (EQ FIBER THERAPY) 625 MG tablet Take 625 mg by mouth daily. FIBER THERAPY    [provider]  rosuvastatin (CRESTOR) 40 MG tablet TAKE 1 TABLET (40 MG TOTAL) BY MOUTH DAILY. 04/25/20   Copland, Frederico Hamman, MD  Simethicone 125 MG CAPS Take 125 mg by mouth daily as needed (gas).    [provider]    Allergies Honey bee venom [bee venom] and Diazepam  Family History  Problem Relation Age of Onset  . Hyperlipidemia Mother   . Arthritis Mother   . Diabetes Mother   . Heart disease Mother   . Colon polyps Mother   . Cancer Paternal Grandmother        breast  . Hypercholesterolemia Sister   . Colon polyps Sister   . Breast cancer Sister   . Colon cancer Neg Hx   . Esophageal cancer Neg Hx   . Rectal cancer Neg Hx   . Stomach cancer Neg Hx   . Pancreatic cancer Neg Hx     Social History Social History   Tobacco Use  . Smoking status: Current Every Day Smoker    Packs/day: 0.75    Years: 45.00    Pack years: 33.75    Types: Cigarettes  . Smokeless tobacco: Never Used  . Tobacco comment: trying to quit before knee surgery  Vaping Use  . Vaping Use: Never used  Substance Use Topics  . Alcohol use: Yes    Alcohol/week: 0.0 standard drinks    Comment: glass of wine twice a month   . Drug use: No    Review of Systems Constitutional: No  fever/chills Eyes: No visual changes. ENT: No sore throat. Cardiovascular: +chest pain. Respiratory: +shortness of breath. Gastrointestinal: No abdominal pain.  Nausea, no vomiting.  No diarrhea.  No constipation. Genitourinary: Negative for dysuria. Musculoskeletal: Negative for neck pain.  Negative for back pain. Integumentary: Negative for rash. Neurological: Negative for headaches, focal weakness or numbness.   ____________________________________________   PHYSICAL EXAM:  VITAL SIGNS: ED Triage Vitals  Enc Vitals Group     BP 11/10/20 2059 (!) 150/64     Pulse Rate 11/10/20 2059 74     Resp 11/10/20 2059 18     Temp 11/10/20 2059 98.4 F (36.9 C)     Temp Source 11/10/20 2059 Oral     SpO2 11/10/20 2059 99 %     Weight 11/10/20 2100 100.2 kg (221 lb)     Height 11/10/20 2100 1.702 m (5\' 7" )     Head Circumference --      Peak Flow --      Pain Score 11/10/20 2100 3     Pain Loc --      Pain Edu? --      Excl. in [place]? --     Constitutional: Alert and oriented.  Eyes: Conjunctivae are normal.  Head: Atraumatic. Nose: No congestion/rhinnorhea. Mouth/Throat: Patient is wearing a mask. Neck: No stridor.  No meningeal signs.   Cardiovascular: Normal rate, regular rhythm. Good peripheral circulation.  Systolic murmur consistent with her history of aortic stenosis. Respiratory: Normal respiratory effort.  No retractions. Gastrointestinal: Soft and nontender. No distention.  Musculoskeletal: No lower extremity tenderness nor edema. No gross deformities of extremities. Neurologic:  Normal speech and language. No gross focal neurologic deficits are appreciated.  Skin:  Skin is warm, dry and intact. Psychiatric: Mood and affect are normal. Speech and behavior are normal.  ____________________________________________   LABS (all labs ordered are listed, but only abnormal results are displayed)  Labs Reviewed  BASIC METABOLIC PANEL - Abnormal; Notable for the following  components:      Result Value   BUN 6 (*)    All other components within normal limits  CBC - Abnormal; Notable for the following components:   RBC 3.67 (*)    MCV 101.4 (*)    MCH 34.1 (*)    RDW 11.3 (*)    All other components within normal limits  RESP PANEL BY RT-PCR (FLU A&B, COVID) ARPGX2  PROTIME-INR  APTT  TROPONIN I (HIGH SENSITIVITY)  TROPONIN I (HIGH SENSITIVITY)   ____________________________________________  EKG  ED ECG REPORT I, Hinda Kehr, the attending physician, personally viewed and interpreted this ECG.  Date: 11/10/2020 EKG Time: 20: 52 Rate: 76 Rhythm: normal sinus rhythm QRS Axis: normal Intervals: Right bundle branch block ST/T Wave abnormalities: Non-specific ST segment / T-wave changes, but no clear evidence of acute ischemia. Narrative Interpretation: no definitive evidence of acute ischemia; does not meet STEMI criteria.   ____________________________________________  RADIOLOGY I, Hinda Kehr, personally viewed and evaluated these images (plain radiographs) as part of my medical decision making, as well as reviewing the written report by the radiologist.  ED MD interpretation: No acute abnormalities identified on chest x-ray  Official radiology report(s): DG Chest 2 View  Result Date: 11/10/2020 CLINICAL DATA:  Chest pain, tightness which began yesterday EXAM: CHEST - 2 VIEW COMPARISON:  Radiograph 11/06/2019 FINDINGS: Chronic hyperinflation with increased AP diameter of the chest and flattening of the diaphragms. Chronic bronchitic features with some coarsened interstitial changes as well. There is dense retrocardiac opacity which is compatible with a diaphragmatic eventration/hernia seen on comparison CT. No consolidation, features of edema, pneumothorax, or effusion. The aorta is calcified. The remaining cardiomediastinal contours are unremarkable. No acute osseous or soft tissue abnormality. Degenerative changes are present in the imaged  spine and shoulders. Prior cervical fusion, incompletely assessed on this exam. IMPRESSION: 1. No acute cardiopulmonary abnormality. 2. Chronic hyperinflation with bronchitic features and interstitial changes, similar to prior. 3. Stable left basilar diaphragmatic eventration/hernia. Electronically Signed   By: Lovena Le M.D.   On: 11/10/2020 21:19    ____________________________________________   PROCEDURES   Procedure(s) performed (including Critical Care):  .Critical Care Performed by: Hinda Kehr, MD Authorized by: Hinda Kehr, MD   Critical care provider statement:    Critical care time (minutes):  30   Critical care time was exclusive of:  Separately billable procedures and treating other patients   Critical care was necessary to treat or prevent imminent or life-threatening deterioration of the following conditions:  Cardiac failure   Critical care was time spent personally by me on the following activities:  Development of treatment plan with patient or surrogate, discussions with consultants, evaluation of patient's response to treatment, examination of patient, obtaining history from patient or surrogate, ordering and performing treatments and interventions, ordering and review of laboratory studies, ordering and review of radiographic studies, pulse oximetry, re-evaluation of patient's condition and review of old charts .1-3 Lead EKG Interpretation Performed by: Hinda Kehr, MD Authorized by: Hinda Kehr, MD     Interpretation: normal     ECG rate:  79   ECG rate assessment: normal     Rhythm: sinus rhythm     Ectopy: none     Conduction: normal       ____________________________________________   INITIAL IMPRESSION / MDM / ASSESSMENT AND PLAN / ED COURSE  As part of my medical decision making, I reviewed the following data within the [facility] notes reviewed and incorporated,  Labs reviewed , EKG interpreted , Old chart reviewed,  Radiograph reviewed , Discussed with admitting physician (Dr. Sidney Ace) and reviewed Notes from prior ED visits   Differential diagnosis includes, but is not limited to, ACS/unstable angina, PE, pneumonia, COPD exacerbation, musculoskeletal pain.  The patient is on the cardiac monitor to evaluate for evidence of arrhythmia and/or significant heart rate changes.  The patient has numerous risk factors for CAD/ACS and has been having worsening episodic chest pain and shortness of breath for some time now.  Her cardiologist is already worried about her and has scheduled outpatient follow-up but her symptoms became acutely worse at rest within the last 24 hours.  Although her EKG shows no signs of ischemia and her troponin is negative, her symptoms are consistent with unstable angina given the worsening symptoms at rest.  Given her high risk of worsening arrhythmias with high morbidity mortality, I will start the patient on heparin bolus plus infusion and admit her to the hospital for chest pain observation and cardiology consultation determine if she would benefit from catheterization.  The rest of her work-up was generally reassuring.  Vital signs are all stable.  Basic metabolic panel and CBC are essentially normal.  High-sensitivity troponin x2 is normal.  I personally reviewed the patient's imaging and agree with the radiologist's interpretation that there is no evidence of an acute abnormality on her chest x-ray.  Consulting the hospitalist for admission at about 4:20 AM.  The patient understands and agrees with the plan.  The patient received a full dose of aspirin at home after calling 911 so she is given heparin but no additional treatment at this time.  She is currently chest pain-free.     Clinical Course as of Nov 11 737  Mon Nov 11, 2020  [id number] Discussed by phone with Dr. Sidney Ace who will admit.   [CF]    Clinical Course User Index [CF] Hinda Kehr, MD      ____________________________________________  FINAL CLINICAL IMPRESSION(S) / ED DIAGNOSES  Final diagnoses:  Unstable angina ([place])     MEDICATIONS GIVEN DURING THIS VISIT:  Medications  heparin ADULT infusion 100 units/mL (25000 units/212mL sodium chloride 0.45%) (1,100 Units/hr Intravenous New Bag/Given 11/11/20 0506)  aspirin EC tablet 81 mg (has no administration in time range)  ezetimibe (ZETIA) tablet 10 mg (has no administration in time range)  metoprolol tartrate (LOPRESSOR) tablet 12.5 mg (has no administration in time range)  rosuvastatin (CRESTOR) tablet 40 mg (has no administration in time range)  buPROPion (WELLBUTRIN XL) 24 hr tablet 150 mg (has no administration in time range)  FLUoxetine (PROZAC) capsule 40 mg (has no administration in time range)  calcium carbonate (TUMS - dosed in mg elemental calcium) chewable tablet 400 mg of elemental calcium (has no administration in time range)  pantoprazole (PROTONIX) EC tablet 40 mg (has no administration in time range)  clonazePAM (KLONOPIN) tablet 0.5 mg (has no administration in time range)  cyclobenzaprine (FLEXERIL) tablet 10 mg (has no administration in time range)  multivitamin tablet 1 tablet (has no administration in time range)  albuterol (VENTOLIN HFA) 108 (90 Base) MCG/ACT inhaler 2 puff (has no administration in time range)  ascorbic acid (VITAMIN C) tablet 1,000 mg (has no administration in time range)  Vitamin D 1,000 Units (has no administration in time range)  aspirin EC tablet 81 mg (has no administration in time range)  nitroGLYCERIN (NITROSTAT) SL tablet 0.4 mg (has no administration in time range)  acetaminophen (TYLENOL) tablet 650 mg (  has no administration in time range)  ondansetron (ZOFRAN) injection 4 mg (has no administration in time range)  0.9 %  sodium chloride infusion (has no administration in time range)  nitroGLYCERIN (NITROGLYN) 2 % ointment 0.5 inch (has no administration in time  range)  zolpidem (AMBIEN) tablet 5 mg (has no administration in time range)  insulin aspart (novoLOG) injection 0-9 Units (has no administration in time range)  heparin bolus via infusion 4,000 Units (4,000 Units Intravenous Bolus from Bag 11/11/20 0506)     ED Discharge Orders    None      *Please note:  EDELL MESENBRINK was evaluated in Emergency Department on 11/11/2020 for the symptoms described in the history of present illness. She was evaluated in the context of the global COVID-19 pandemic, which necessitated consideration that the patient might be at risk for infection with the SARS-CoV-2 virus that causes COVID-19. Institutional protocols and algorithms that pertain to the evaluation of patients at risk for COVID-19 are in a state of rapid change based on information released by regulatory bodies including the CDC and federal and state organizations. These policies and algorithms were followed during the patient's care in the ED.  Some ED evaluations and interventions may be delayed as a result of limited staffing during and after the pandemic.*  Note:  This document was prepared using Dragon voice recognition software and may include unintentional dictation errors.   Hinda Kehr, MD 11/11/20 [phone]

## 2020-11-11 NOTE — ED Notes (Signed)
Report to Cath lab, pt to be transported at this time.

## 2020-11-11 NOTE — Progress Notes (Signed)
ANTICOAGULATION CONSULT NOTE - Initial Consult  Pharmacy Consult for  Heparin  Indication: chest pain/ACS  Allergies  Allergen Reactions  . Honey Bee Venom [Bee Venom] Anaphylaxis  . Diazepam Other (See Comments)    REACTION: pt states she gets very angry and abusive verbally on this medication     Patient Measurements: Height: 5\' 7"  (170.2 cm) Weight: 100.2 kg (221 lb) IBW/kg (Calculated) : 61.6 Heparin Dosing Weight: 84 kg   Vital Signs: Temp: 98.4 F (36.9 C) (12/05 2059) Temp Source: Oral (12/05 2059) BP: 128/59 (12/06 0400) Pulse Rate: 67 (12/06 0400)  Labs: Recent Labs    11/10/20 2107 11/10/20 2354  HGB 12.5  --   HCT 37.2  --   PLT 183  --   CREATININE 0.78  --   TROPONINIHS 4 3    Estimated Creatinine Clearance: 79.5 mL/min (by C-G formula based on SCr of 0.78 mg/dL).   Medical History: Past Medical History:  Diagnosis Date  . 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 sting    bee stings  . Anxiety   . Aortic stenosis    a. 2013: nl LV sys fxn, mild MR, no evidence of pulm htn; b. TTE 8/17: EF 60-65%, no RWMA, nl LV dia fxn,  mild AS, mod AI  . COPD (chronic obstructive pulmonary disease) ([place]) 10/30/2016  . Degenerative arthritis of knee, bilateral 06/16/2015  . Depression   . Diabetes ([place])    diet managed-type 11  . GERD (gastroesophageal reflux disease)   . Heart murmur   . Hiatal hernia with gastroesophageal reflux 1997  . Hyperlipidemia   . Hypertension   . Neuromuscular disorder ([place])    nerve damage in back and shoulder  . Osteoporosis   . Palpitations    a. 03/2012: 48-hour Holter monitor showed intermittent sinus tachycardia with occasional PACs, without other significant arrhythmia  . Thyroid disease    hyperthyroidism as a teenager - received some injections age 89 that corrected this   . Tobacco abuse     Medications:  (Not in a hospital admission)   Assessment: Pharmacy consulted to dose heparin in this 69 year old female admitted with ACS/NSTEMI.   CrCl = 79.5 ml/min  No prior anticoag noted.   Goal of Therapy:  Heparin level 0.3-0.7 units/ml Monitor platelets by anticoagulation protocol: Yes   Plan:  Give 4000 units bolus x 1 Start heparin infusion at 1100 units/hr Check anti-Xa level in 6 hours and daily while on heparin Continue to monitor H&H and platelets  Jametta Moorehead D 11/11/2020,4:32 AM

## 2020-11-11 NOTE — H&P (Signed)
[place]   PATIENT NAME: Amanda [name]    MR#:  [id number]  DATE OF BIRTH:  01/06/1951  DATE OF ADMISSION:  11/11/2020  PRIMARY CARE PHYSICIAN: Owens Loffler, MD   REQUESTING/REFERRING PHYSICIAN: Hinda Kehr, MD  CHIEF COMPLAINT:   Chief Complaint  Patient presents with  . Chest Pain  . Shortness of Breath    HISTORY OF PRESENT ILLNESS:  Amanda [name]  is a 69 y.o. Caucasian female with a known history of hypertension, dyslipidemia, type 2 diabetes mellitus, COPD and depression, presented to the emergency room with an onset of intermittent midsternal chest pain felt as pressure since Saturday.  She graded symptoms 12/10 in severity with radiation to her left shoulder and neck.  She admits to nausea without vomiting or diaphoresis with it.  No leg pain or edema recent travels or surgeries.  No cough or wheezing or hemoptysis.  No headache or dizziness or blurred vision.  Upon presentation to the emergency room, blood pressure was 150/64 with otherwise normal vital signs.  Labs revealed unremarkable BMP and CBC showed macrocytosis.  Influenza antigens and COVID-19 PCR came back negative.  Troponin I was 4 and later 3.  Two-view chest x-ray showed no acute cardiopulmonary disease with chronic hyperinflation with bronchitic features and interstitial changes similar to prior chest x-ray as well as stable left basilar diaphragmatic eventration/hernia and EKG showed  normal sinus rhythm with rate of 76 with right bundle branch block.  The patient was given a bolus of IV heparin followed by IV heparin drip.  She will be admitted to an observation progressive unit bed for further evaluation and management.  PAST MEDICAL HISTORY:   Past Medical History:  Diagnosis Date  . 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 sting    bee stings  . Anxiety   . Aortic stenosis    a. 2013: nl LV sys fxn, mild MR, no evidence of pulm htn; b. TTE 8/17: EF 60-65%, no RWMA, nl LV dia fxn,  mild AS, mod AI  . COPD  (chronic obstructive pulmonary disease) ([place]) 10/30/2016  . Degenerative arthritis of knee, bilateral 06/16/2015  . Depression   . Diabetes ([place])    diet managed-type 11  . GERD (gastroesophageal reflux disease)   . Heart murmur   . Hiatal hernia with gastroesophageal reflux 1997  . Hyperlipidemia   . Hypertension   . Neuromuscular disorder ([place])    nerve damage in back and shoulder  . Osteoporosis   . Palpitations    a. 03/2012: 48-hour Holter monitor showed intermittent sinus tachycardia with occasional PACs, without other significant arrhythmia  . Thyroid disease    hyperthyroidism as a teenager - received some injections age 53 that corrected this   . Tobacco abuse     PAST SURGICAL HISTORY:   Past Surgical History:  Procedure Laterality Date  . ABDOMINAL HYSTERECTOMY  1993  . ANTERIOR CERVICAL DECOMP/DISCECTOMY FUSION N/A 10/26/2018   Procedure: ACDF C4-C5 C5-C6 C6-C7;  Surgeon: Kary Kos, MD;  Location: [facility];  Service: Neurosurgery;  Laterality: N/A;  . APPENDECTOMY    . BACK SURGERY    . BREAST BIOPSY Right 2007   benign  . CHOLECYSTECTOMY    . COLONOSCOPY     30 years ago was normal per pt.   Marland Kitchen DILATION AND CURETTAGE OF UTERUS    . ESOPHAGEAL DILATION     x 3  . fractured leg Right   . OOPHORECTOMY    . OVARIAN CYST REMOVAL  [place]    . TONSILLECTOMY     4 yoa  . TOTAL KNEE ARTHROPLASTY Left 12/24/2015   Procedure: LEFT TOTAL KNEE ARTHROPLASTY;  Surgeon: [name], MD;  Location: [facility];  Service: Orthopedics;  Laterality: Left;  . TOTAL KNEE ARTHROPLASTY Right 12/30/2017   Procedure: RIGHT TOTAL KNEE ARTHROPLASTY;  Surgeon: [name], MD;  Location: [facility];  Service: Orthopedics;  Laterality: Right;  . WRIST FRACTURE SURGERY  11/2008    SOCIAL HISTORY:   Social History   Tobacco Use  . Smoking status: Current Every Day Smoker    Packs/day: 0.75    Years: 45.00    Pack years: 33.75    Types: Cigarettes  .  Smokeless tobacco: Never Used  . Tobacco comment: trying to quit before knee surgery  Substance Use Topics  . Alcohol use: Yes    Alcohol/week: 0.0 standard drinks    Comment: glass of wine twice a month     FAMILY HISTORY:   Family History  Problem Relation Age of Onset  . Hyperlipidemia Mother   . Arthritis Mother   . Diabetes Mother   . Heart disease Mother   . Colon polyps Mother   . Cancer Paternal Grandmother        breast  . Hypercholesterolemia Sister   . Colon polyps Sister   . Breast cancer Sister   . Colon cancer Neg Hx   . Esophageal cancer Neg Hx   . Rectal cancer Neg Hx   . Stomach cancer Neg Hx   . Pancreatic cancer Neg Hx     DRUG ALLERGIES:   Allergies  Allergen Reactions  . Honey Bee Venom [Bee Venom] Anaphylaxis  . Diazepam Other (See Comments)    REACTION: pt states she gets very angry and abusive verbally on this medication     REVIEW OF SYSTEMS:   ROS As per history of present illness. All pertinent systems were reviewed above. Constitutional, HEENT, cardiovascular, respiratory, GI, GU, musculoskeletal, neuro, psychiatric, endocrine, integumentary and hematologic systems were reviewed and are otherwise negative/unremarkable except for positive findings mentioned above in the HPI.   MEDICATIONS AT HOME:   Prior to Admission medications   Medication Sig Start Date End Date Taking? Authorizing Provider  acetaminophen (TYLENOL) 650 MG CR tablet Take 650 mg by mouth 3 times/day as needed-between meals & bedtime (for headaches).   Yes [provider]  albuterol (PROVENTIL HFA;VENTOLIN HFA) 108 (90 Base) MCG/ACT inhaler Inhale 2 puffs into the lungs every 4 (four) hours as needed for wheezing or shortness of breath. 07/27/18  Yes Copland, Frederico Hamman, MD  amitriptyline (ELAVIL) 50 MG tablet TAKE 2 TABLETS BY MOUTH DAILY AT BEDTIME. 08/20/20  Yes Copland, Frederico Hamman, MD  Ascorbic Acid (VITAMIN C) 1000 MG tablet Take 1,000 mg by mouth daily.   Yes  [provider]  aspirin EC 81 MG tablet Take 81 mg by mouth daily.   Yes [provider]  buPROPion (WELLBUTRIN XL) 150 MG 24 hr tablet TAKE 1 TABLET (150 MG TOTAL) BY MOUTH DAILY. 08/20/20  Yes Copland, Frederico Hamman, MD  calcium carbonate (TUMS - DOSED IN MG ELEMENTAL CALCIUM) 500 MG chewable tablet Chew 2 tablets by mouth daily as needed for indigestion or heartburn.   Yes [provider]  Cholecalciferol (VITAMIN D) 1000 UNITS capsule Take 1,000 Units by mouth daily.    Yes [provider]  clonazePAM (KLONOPIN) 0.5 MG tablet Take 1 tablet (0.5 mg total) by  mouth 2 (two) times daily as needed. 10/21/20  Yes Copland, Frederico Hamman, MD  cyclobenzaprine (FLEXERIL) 10 MG tablet TAKE 1 TABLET THREE TIMES DAILY AS NEEDED FOR MUSCLE SPAMS 06/17/20  Yes Copland, Frederico Hamman, MD  FLUoxetine (PROZAC) 40 MG capsule TAKE 1 CAPSULE (40 MG TOTAL) BY MOUTH DAILY. 08/20/20  Yes Copland, Frederico Hamman, MD  metoprolol tartrate (LOPRESSOR) 25 MG tablet Take 1/2 (one-half) tablet by mouth twice daily 11/22/19  Yes Wellington Hampshire, MD  Multiple Vitamin (MULTIVITAMIN) tablet Take 1 tablet by mouth daily.   Yes [provider]  naproxen sodium (ALEVE) 220 MG tablet Take 220 mg by mouth daily.   Yes [provider]  pantoprazole (PROTONIX) 40 MG tablet TAKE ONE TABLET BY MOUTH 30 MINUTES PRIOR TO BREAKFAST 11/28/19  Yes Copland, Frederico Hamman, MD  polycarbophil (EQ FIBER THERAPY) 625 MG tablet Take 625 mg by mouth daily as needed. FIBER THERAPY    Yes [provider]  rosuvastatin (CRESTOR) 40 MG tablet TAKE 1 TABLET (40 MG TOTAL) BY MOUTH DAILY. 04/25/20  Yes Copland, Frederico Hamman, MD  Simethicone 125 MG CAPS Take 125 mg by mouth daily as needed (gas).   Yes [provider]  ezetimibe (ZETIA) 10 MG tablet Take 1 tablet (10 mg total) by mouth daily. 11/08/20 02/06/21  Wellington Hampshire, MD      VITAL SIGNS:  Blood pressure 137/89, pulse 72, temperature 98.4 F (36.9 C),  temperature source Oral, resp. rate (!) 24, height 5\' 7"  (1.702 m), weight 100.2 kg, SpO2 98 %.  PHYSICAL EXAMINATION:  Physical Exam  GENERAL:  69 y.o.-year-old Caucasian female patient lying in the bed with no acute distress.  EYES: Pupils equal, round, reactive to light and accommodation. No scleral icterus. Extraocular muscles intact.  HEENT: Head atraumatic, normocephalic. Oropharynx and nasopharynx clear.  NECK:  Supple, no jugular venous distention. No thyroid enlargement, no tenderness.  LUNGS: Normal breath sounds bilaterally, no wheezing, rales,rhonchi or crepitation. No use of accessory muscles of respiration.  CARDIOVASCULAR: Regular rate and rhythm, S1, S2 normal. No murmurs, rubs, or gallops.  ABDOMEN: Soft, nondistended, nontender. Bowel sounds present. No organomegaly or mass.  EXTREMITIES: No pedal edema, cyanosis, or clubbing.  NEUROLOGIC: Cranial nerves II through XII are intact. Muscle strength 5/5 in all extremities. Sensation intact. Gait not checked.  PSYCHIATRIC: The patient is alert and oriented x 3.  Normal affect and good eye contact. SKIN: No obvious rash, lesion, or ulcer.   LABORATORY PANEL:   CBC Recent Labs  Lab 11/10/20 2107  WBC 5.6  HGB 12.5  HCT 37.2  PLT 183   ------------------------------------------------------------------------------------------------------------------  Chemistries  Recent Labs  Lab 11/10/20 2107  NA 139  K 3.8  CL 105  CO2 26  GLUCOSE 94  BUN 6*  CREATININE 0.78  CALCIUM 9.4   ------------------------------------------------------------------------------------------------------------------  Cardiac Enzymes No results for input(s): TROPONINI in the last 168 hours. ------------------------------------------------------------------------------------------------------------------  RADIOLOGY:  DG Chest 2 View  Result Date: 11/10/2020 CLINICAL DATA:  Chest pain, tightness which began yesterday EXAM: CHEST - 2  VIEW COMPARISON:  Radiograph 11/06/2019 FINDINGS: Chronic hyperinflation with increased AP diameter of the chest and flattening of the diaphragms. Chronic bronchitic features with some coarsened interstitial changes as well. There is dense retrocardiac opacity which is compatible with a diaphragmatic eventration/hernia seen on comparison CT. No consolidation, features of edema, pneumothorax, or effusion. The aorta is calcified. The remaining cardiomediastinal contours are unremarkable. No acute osseous or soft tissue abnormality. Degenerative changes are present in the imaged spine  and shoulders. Prior cervical fusion, incompletely assessed on this exam. IMPRESSION: 1. No acute cardiopulmonary abnormality. 2. Chronic hyperinflation with bronchitic features and interstitial changes, similar to prior. 3. Stable left basilar diaphragmatic eventration/hernia. Electronically Signed   By: Lovena Le M.D.   On: 11/10/2020 21:19      IMPRESSION AND PLAN:   1.  Recurrent/intermittent chest pain concerning for unstable angina/acute coronary syndrome. -The patient will be admitted to a progressive unit observation bed. -We will follow serial troponin aspirin -She was expected to have an outpatient nuclear study ordered by Dr. Fletcher Anon. -Cardiology consultation will be obtained. -I notified Dr. Fletcher Anon about the patient. -We will continue her on high-dose statin as well as aspirin, beta-blocker therapy with Lopressor, as needed sublingual nitroglycerin and morphine sulfate as well as scheduled Nitropaste.  2.  Dyslipidemia. -We will continue Zetia and high-dose statin therapy and check fasting lipids.  3.  GERD. -PPI therapy will be resumed.  4.  Anxiety and depression. -We will continue Klonopin, Wellbutrin XL and Prozac.  5.  Controlled type 2 diabetes mellitus. -She will be placed on supplement coverage with NovoLog  6.  DVT prophylaxis. -Subcutaneous Lovenox   All the records are reviewed and  case discussed with ED provider. The plan of care was discussed in details with the patient (and family). I answered all questions. The patient agreed to proceed with the above mentioned plan. Further management will depend upon hospital course.   CODE STATUS: Full code  Status is: Observation  The patient remains OBS appropriate and will d/c before 2 midnights.  Dispo: The patient is from: Home              Anticipated d/c is to: Home              Anticipated d/c date is: 1 day              Patient currently is not medically stable to d/c.   TOTAL TIME TAKING CARE OF THIS PATIENT: 55 minutes.    Christel Mormon M.D on 11/11/2020 at 7:14 AM  [facility]   From 7 PM-7 AM, contact night-coverage [email or web address]  CC: Primary care physician; Owens Loffler, MD

## 2020-11-11 NOTE — Discharge Summary (Signed)
Physician Discharge Summary  MAGUIRE KILLMER XBJ:[id number] DOB: 13-Jun-1951 DOA: 11/11/2020  PCP: Owens Loffler, MD  Admit date: 11/11/2020 Discharge date: 11/11/2020  Admitted From: Home Disposition: Home  Recommendations for Outpatient Follow-up:  1. Follow up with PCP in 1-2 weeks 2. Follow-up with cardiology in 2 to 3 weeks 3. Please obtain BMP/CBC in one week 4. Please follow up on the following pending results: None  Home Health: No Equipment/Devices: None Discharge Condition: Stable CODE STATUS: Full Diet recommendation: Heart Healthy / Carb Modified   Brief/Interim Summary: Amanda [name]  is a 69 y.o. Caucasian female with a known history of hypertension, dyslipidemia, type 2 diabetes mellitus, COPD and depression, presented to the emergency room with an onset of intermittent midsternal chest pain felt as pressure since Saturday.  She graded symptoms 12/10 in severity with radiation to her left shoulder and neck.  She admits to nausea without vomiting or diaphoresis with it.  On presentation she was mildly hypertensive.  Labs unremarkable.  COVID-19 PCR negative.  EKG without any acute changes but did show right bundle branch block.  Troponin remain negative.  Because of her concerning symptoms she was taken for cardiac catheterization and showed no significant pulmonary hypertension, stable moderate aortic stenosis and mild to moderate nonobstructive coronary artery disease. No cardiac culprit is identified for the patient's symptoms.  Cardiology recommends continuation of medical management and she will follow up with her cardiologist in 2 to 3 weeks for further recommendations.  Patient remained stable after cardiac catheterization.  Chest pain resolved.  She was discharged home and will continue her home meds.  Discharge Diagnoses:  Active Problems:   ACS (acute coronary syndrome) (HCC)   Unstable angina (HCC)   Nonrheumatic aortic valve stenosis   Discharge  Instructions  Discharge Instructions    Diet - low sodium heart healthy   Complete by: As directed    Discharge instructions   Complete by: As directed    It was pleasure taking care of you. Please continue with your home medications and follow-up with your cardiologist in 2 to 3 weeks.   Increase activity slowly   Complete by: As directed      Allergies as of 11/11/2020      Reactions   Honey Bee Venom [bee Venom] Anaphylaxis   Diazepam Other (See Comments)   REACTION: pt states she gets very angry and abusive verbally on this medication      Medication List    TAKE these medications   acetaminophen 650 MG CR tablet Commonly known as: TYLENOL Take 650 mg by mouth 3 times/day as needed-between meals & bedtime (for headaches).   albuterol 108 (90 Base) MCG/ACT inhaler Commonly known as: VENTOLIN HFA Inhale 2 puffs into the lungs every 4 (four) hours as needed for wheezing or shortness of breath.   amitriptyline 50 MG tablet Commonly known as: ELAVIL TAKE 2 TABLETS BY MOUTH DAILY AT BEDTIME.   aspirin EC 81 MG tablet Take 81 mg by mouth daily.   buPROPion 150 MG 24 hr tablet Commonly known as: WELLBUTRIN XL TAKE 1 TABLET (150 MG TOTAL) BY MOUTH DAILY.   calcium carbonate 500 MG chewable tablet Commonly known as: TUMS - dosed in mg elemental calcium Chew 2 tablets by mouth daily as needed for indigestion or heartburn.   clonazePAM 0.5 MG tablet Commonly known as: KLONOPIN Take 1 tablet (0.5 mg total) by mouth 2 (two) times daily as needed.   cyclobenzaprine 10 MG tablet Commonly known as: FLEXERIL  TAKE 1 TABLET THREE TIMES DAILY AS NEEDED FOR MUSCLE SPAMS   EQ Fiber Therapy 625 MG tablet Generic drug: polycarbophil Take 625 mg by mouth daily as needed. FIBER THERAPY   ezetimibe 10 MG tablet Commonly known as: ZETIA Take 1 tablet (10 mg total) by mouth daily.   FLUoxetine 40 MG capsule Commonly known as: PROZAC TAKE 1 CAPSULE (40 MG TOTAL) BY MOUTH DAILY.    metoprolol tartrate 25 MG tablet Commonly known as: LOPRESSOR Take 1/2 (one-half) tablet by mouth twice daily   multivitamin tablet Take 1 tablet by mouth daily.   naproxen sodium 220 MG tablet Commonly known as: ALEVE Take 220 mg by mouth daily.   pantoprazole 40 MG tablet Commonly known as: PROTONIX TAKE ONE TABLET BY MOUTH 30 MINUTES PRIOR TO BREAKFAST   rosuvastatin 40 MG tablet Commonly known as: CRESTOR TAKE 1 TABLET (40 MG TOTAL) BY MOUTH DAILY.   Simethicone 125 MG Caps Take 125 mg by mouth daily as needed (gas).   vitamin C 1000 MG tablet Take 1,000 mg by mouth daily.   Vitamin D 1000 units capsule Take 1,000 Units by mouth daily.       Follow-up Information    Cop[place], Frederico Hamman, MD. Schedule an appointment as soon as possible for a visit.   Specialties: Family Medicine, Sports Medicine Contact information: [place] [address] [phone]        Wellington Hampshire, MD. Schedule an appointment as soon as possible for a visit.   Specialty: Cardiology Contact information: [address] [place] [address] [phone]              Allergies  Allergen Reactions  . Honey Bee Venom [Bee Venom] Anaphylaxis  . Diazepam Other (See Comments)    REACTION: pt states she gets very angry and abusive verbally on this medication     Consultations:  Cardiology  Procedures/Studies: DG Chest 2 View  Result Date: 11/10/2020 CLINICAL DATA:  Chest pain, tightness which began yesterday EXAM: CHEST - 2 VIEW COMPARISON:  Radiograph 11/06/2019 FINDINGS: Chronic hyperinflation with increased AP diameter of the chest and flattening of the diaphragms. Chronic bronchitic features with some coarsened interstitial changes as well. There is dense retrocardiac opacity which is compatible with a diaphragmatic eventration/hernia seen on comparison CT. No consolidation, features of edema, pneumothorax, or effusion. The aorta is  calcified. The remaining cardiomediastinal contours are unremarkable. No acute osseous or soft tissue abnormality. Degenerative changes are present in the imaged spine and shoulders. Prior cervical fusion, incompletely assessed on this exam. IMPRESSION: 1. No acute cardiopulmonary abnormality. 2. Chronic hyperinflation with bronchitic features and interstitial changes, similar to prior. 3. Stable left basilar diaphragmatic eventration/hernia. Electronically Signed   By: Lovena Le M.D.   On: 11/10/2020 21:19   ECHOCARDIOGRAM COMPLETE  Result Date: 11/07/2020    ECHOCARDIOGRAM REPORT   Patient Name:   LAVERLE PILLARD Sigel Date of Exam: 11/07/2020 Medical Rec #:  [id number]        Height:       65.5 in Accession #:    [id number]       Weight:       224.5 lb Date of Birth:  1951/06/05        BSA:          2.090 m Patient Age:    12 years         BP:           130/74  mmHg Patient Gender: F                HR:           82 bpm. Exam Location:  [place] Procedure: 2D Echo, Cardiac Doppler and Color Doppler Indications:    I35.2 Nonrheumatic aortic (valve) stenosis with insufficiency  History:        Patient has prior history of Echocardiogram examinations, most                 recent 08/23/2019. COPD, Aortic Valve Disease,                 Signs/Symptoms:Shortness of Breath; Risk Factors:Hypertension,                 Diabetes, Dyslipidemia and Current Smoker.  Sonographer:    Pilar Jarvis RDMS, RVT, RDCS Referring Phys: 91 [facility] A ARIDA  Sonographer Comments: Global longitudinal strain was attempted. IMPRESSIONS  1. Left ventricular ejection fraction, by estimation, is 60 to 65%. The left ventricle has normal function. The left ventricle has no regional wall motion abnormalities. There is mild to moderate left ventricular hypertrophy. Left ventricular diastolic parameters are consistent with Grade I diastolic dysfunction (impaired relaxation). The average left ventricular global longitudinal strain is -15.3 %. The  global longitudinal strain is abnormal.  2. Right ventricular systolic function is normal. The right ventricular size is normal.  3. Left atrial size was mildly dilated.  4. The aortic valve is moderately calcified. Aortic valve regurgitation is mild. Moderate aortic valve stenosis. Aortic valve area, by VTI measures 1.29 cm. Aortic valve mean gradient measures 22.2 mmHg. Aortic valve Vmax measures 3.34 m/s. FINDINGS  Left Ventricle: Left ventricular ejection fraction, by estimation, is 60 to 65%. The left ventricle has normal function. The left ventricle has no regional wall motion abnormalities. The average left ventricular global longitudinal strain is -15.3 %. The global longitudinal strain is abnormal. The left ventricular internal cavity size was normal in size. There is mild left ventricular hypertrophy. Left ventricular diastolic parameters are consistent with Grade I diastolic dysfunction (impaired relaxation). Right Ventricle: The right ventricular size is normal. No increase in right ventricular wall thickness. Right ventricular systolic function is normal. Left Atrium: Left atrial size was mildly dilated. Right Atrium: Right atrial size was normal in size. Pericardium: There is no evidence of pericardial effusion. Mitral Valve: The mitral valve is normal in structure. Mild mitral annular calcification. No evidence of mitral valve regurgitation. No evidence of mitral valve stenosis. Tricuspid Valve: The tricuspid valve is normal in structure. Tricuspid valve regurgitation is not demonstrated. No evidence of tricuspid stenosis. Aortic Valve: The aortic valve is normal in structure. Aortic valve regurgitation is mild. Aortic regurgitation PHT measures 434 msec. Moderate aortic stenosis is present. Aortic valve mean gradient measures 22.2 mmHg. Aortic valve peak gradient measures  44.5 mmHg. Aortic valve area, by VTI measures 1.29 cm. Pulmonic Valve: The pulmonic valve was normal in structure. Pulmonic  valve regurgitation is not visualized. No evidence of pulmonic stenosis. Aorta: The aortic root is normal in size and structure. Venous: The inferior vena cava is normal in size with greater than 50% respiratory variability, suggesting right atrial pressure of 3 mmHg. IAS/Shunts: No atrial level shunt detected by color flow Doppler.  LEFT VENTRICLE PLAX 2D LVIDd:         4.20 cm     Diastology LVIDs:         2.50 cm     LV e'  medial:    7.18 cm/s LV PW:         1.20 cm     LV E/e' medial:  9.5 LV IVS:        1.20 cm     LV e' lateral:   6.53 cm/s LVOT diam:     2.20 cm     LV E/e' lateral: 10.5 LV SV:         90 LV SV Index:   43          2D Longitudinal Strain LVOT Area:     3.80 cm    2D Strain GLS (A2C):   -11.1 %                            2D Strain GLS (A3C):   -19.0 %                            2D Strain GLS (A4C):   -15.6 % LV Volumes (MOD)           2D Strain GLS Avg:     -15.3 % LV vol d, MOD A2C: 72.6 ml LV vol d, MOD A4C: 84.2 ml LV vol s, MOD A2C: 33.7 ml LV vol s, MOD A4C: 39.4 ml 3D Volume EF: LV SV MOD A2C:     38.9 ml 3D EF:        56 % LV SV MOD A4C:     84.2 ml LV EDV:       133 ml LV SV MOD BP:      43.3 ml LV ESV:       59 ml                            LV SV:        75 ml RIGHT VENTRICLE             IVC RV Basal diam:  3.10 cm     IVC diam: 1.90 cm RV S prime:     12.60 cm/s TAPSE (M-mode): 3.0 cm LEFT ATRIUM             Index       RIGHT ATRIUM           Index LA diam:        4.20 cm 2.01 cm/m  RA Area:     13.10 cm LA Vol (A2C):   71.5 ml 34.22 ml/m RA Volume:   27.90 ml  13.35 ml/m LA Vol (A4C):   59.5 ml 28.47 ml/m LA Biplane Vol: 68.5 ml 32.78 ml/m  AORTIC VALVE                    PULMONIC VALVE AV Area (Vmax):    1.25 cm     PV Vmax:       0.98 m/s AV Area (Vmean):   1.22 cm     PV Peak grad:  3.8 mmHg AV Area (VTI):     1.29 cm AV Vmax:           333.50 cm/s AV Vmean:          225.500 cm/s AV VTI:            0.702 m AV Peak Grad:      44.5 mmHg AV Mean Grad:  22.2 mmHg LVOT  Vmax:         110.00 cm/s LVOT Vmean:        72.300 cm/s LVOT VTI:          0.238 m LVOT/AV VTI ratio: 0.34 AI PHT:            434 msec  AORTA Ao Root diam: 3.00 cm Ao Asc diam:  2.80 cm Ao Arch diam: 2.3 cm MITRAL VALVE MV Area (PHT): 2.46 cm     SHUNTS MV Decel Time: 309 msec     Systemic VTI:  0.24 m MV E velocity: 68.40 cm/s   Systemic Diam: 2.20 cm MV A velocity: 109.00 cm/s MV E/A ratio:  0.63 Ida Rogue MD Electronically signed by Ida Rogue MD Signature Date/Time: 11/07/2020/5:53:57 PM    Final      Subjective: Patient was seen after cardiac catheterization today.  Denies any chest pain or shortness of breath.  Discharge Exam: Vitals:   11/11/20 1230 11/11/20 1245  BP: (!) 124/59   Pulse: 67 86  Resp: 20 19  Temp:    SpO2: 97% 97%   Vitals:   11/11/20 1200 11/11/20 1215 11/11/20 1230 11/11/20 1245  BP: 125/69 122/82 (!) 124/59   Pulse: 80 89 67 86  Resp: 17 20 20 19   Temp:      TempSrc:      SpO2: 98% 98% 97% 97%  Weight:      Height:        General: Pt is alert, awake, not in acute distress Cardiovascular: RRR, S1/S2 +, no rubs, no gallops Respiratory: CTA bilaterally, no wheezing, no rhonchi Abdominal: Soft, NT, ND, bowel sounds + Extremities: no edema, no cyanosis   The results of significant diagnostics from this hospitalization (including imaging, microbiology, ancillary and laboratory) are listed below for reference.    Microbiology: Recent Results (from the past 240 hour(s))  Resp Panel by RT-PCR (Flu A&B, Covid) Nasopharyngeal Swab     Status: None   Collection Time: 11/11/20  4:39 AM   Specimen: Nasopharyngeal Swab; Nasopharyngeal(NP) swabs in vial transport medium  Result Value Ref Range Status   SARS Coronavirus 2 by RT PCR NEGATIVE NEGATIVE Final    Comment: (NOTE) SARS-CoV-2 target nucleic acids are NOT DETECTED.  The SARS-CoV-2 RNA is generally detectable in upper respiratory specimens during the acute phase of infection. The  lowest concentration of SARS-CoV-2 viral copies this assay can detect is 138 copies/mL. A negative result does not preclude SARS-Cov-2 infection and should not be used as the sole basis for treatment or other patient management decisions. A negative result may occur with  improper specimen collection/handling, submission of specimen other than nasopharyngeal swab, presence of viral mutation(s) within the areas targeted by this assay, and inadequate number of viral copies(<138 copies/mL). A negative result must be combined with clinical observations, patient history, and epidemiological information. The expected result is Negative.  Fact Sheet for Patients:  EntrepreneurPulse.com.au  Fact Sheet for Healthcare Providers:  IncredibleEmployment.be  This test is no t yet approved or cleared by the [place] FDA and  has been authorized for detection and/or diagnosis of SARS-CoV-2 by FDA under an Emergency Use Authorization (EUA). This EUA will remain  in effect (meaning this test can be used) for the duration of the COVID-19 declaration under Section 564(b)(1) of the Act, 21 U.S.C.section 360bbb-3(b)(1), unless the authorization is terminated  or revoked sooner.       Influenza A by PCR NEGATIVE NEGATIVE Final  Influenza B by PCR NEGATIVE NEGATIVE Final    Comment: (NOTE) The Xpert Xpress SARS-CoV-2/FLU/RSV plus assay is intended as an aid in the diagnosis of influenza from Nasopharyngeal swab specimens and should not be used as a sole basis for treatment. Nasal washings and aspirates are unacceptable for Xpert Xpress SARS-CoV-2/FLU/RSV testing.  Fact Sheet for Patients: EntrepreneurPulse.com.au  Fact Sheet for Healthcare Providers: IncredibleEmployment.be  This test is not yet approved or cleared by the [place] FDA and has been authorized for detection and/or diagnosis of SARS-CoV-2 by FDA under  an Emergency Use Authorization (EUA). This EUA will remain in effect (meaning this test can be used) for the duration of the COVID-19 declaration under Section 564(b)(1) of the Act, 21 U.S.C. section 360bbb-3(b)(1), unless the authorization is terminated or revoked.  Performed at [facility], [address] [place], [place] [address]      Labs: BNP (last 3 results) No results for input(s): BNP in the last 8760 hours. Basic Metabolic Panel: Recent Labs  Lab 11/10/20 2107  NA 139  K 3.8  CL 105  CO2 26  GLUCOSE 94  BUN 6*  CREATININE 0.78  CALCIUM 9.4   Liver Function Tests: No results for input(s): AST, ALT, ALKPHOS, BILITOT, PROT, ALBUMIN in the last 168 hours. No results for input(s): LIPASE, AMYLASE in the last 168 hours. No results for input(s): AMMONIA in the last 168 hours. CBC: Recent Labs  Lab 11/10/20 2107  WBC 5.6  HGB 12.5  HCT 37.2  MCV 101.4*  PLT 183   Cardiac Enzymes: No results for input(s): CKTOTAL, CKMB, CKMBINDEX, TROPONINI in the last 168 hours. BNP: Invalid input(s): POCBNP CBG: Recent Labs  Lab 11/11/20 0947  GLUCAP 114*   D-Dimer No results for input(s): DDIMER in the last 72 hours. Hgb A1c No results for input(s): HGBA1C in the last 72 hours. Lipid Profile No results for input(s): CHOL, HDL, LDLCALC, TRIG, CHOLHDL, LDLDIRECT in the last 72 hours. Thyroid function studies No results for input(s): TSH, T4TOTAL, T3FREE, THYROIDAB in the last 72 hours.  Invalid input(s): FREET3 Anemia work up No results for input(s): VITAMINB12, FOLATE, FERRITIN, TIBC, IRON, RETICCTPCT in the last 72 hours. Urinalysis    Component Value Date/Time   COLORURINE YELLOW (A) 10/24/2019 0559   APPEARANCEUR CLEAR (A) 10/24/2019 0559   LABSPEC 1.009 10/24/2019 0559   PHURINE 7.0 10/24/2019 0559   GLUCOSEU NEGATIVE 10/24/2019 0559   HGBUR SMALL (A) 10/24/2019 0559   BILIRUBINUR NEGATIVE 10/24/2019 0559   KETONESUR NEGATIVE 10/24/2019 0559    PROTEINUR NEGATIVE 10/24/2019 0559   NITRITE NEGATIVE 10/24/2019 0559   LEUKOCYTESUR MODERATE (A) 10/24/2019 0559   Sepsis Labs Invalid input(s): PROCALCITONIN,  WBC,  LACTICIDVEN Microbiology Recent Results (from the past 240 hour(s))  Resp Panel by RT-PCR (Flu A&B, Covid) Nasopharyngeal Swab     Status: None   Collection Time: 11/11/20  4:39 AM   Specimen: Nasopharyngeal Swab; Nasopharyngeal(NP) swabs in vial transport medium  Result Value Ref Range Status   SARS Coronavirus 2 by RT PCR NEGATIVE NEGATIVE Final    Comment: (NOTE) SARS-CoV-2 target nucleic acids are NOT DETECTED.  The SARS-CoV-2 RNA is generally detectable in upper respiratory specimens during the acute phase of infection. The lowest concentration of SARS-CoV-2 viral copies this assay can detect is 138 copies/mL. A negative result does not preclude SARS-Cov-2 infection and should not be used as the sole basis for treatment or other patient management decisions. A negative result may occur with  improper  specimen collection/handling, submission of specimen other than nasopharyngeal swab, presence of viral mutation(s) within the areas targeted by this assay, and inadequate number of viral copies(<138 copies/mL). A negative result must be combined with clinical observations, patient history, and epidemiological information. The expected result is Negative.  Fact Sheet for Patients:  EntrepreneurPulse.com.au  Fact Sheet for Healthcare Providers:  IncredibleEmployment.be  This test is no t yet approved or cleared by the [place] FDA and  has been authorized for detection and/or diagnosis of SARS-CoV-2 by FDA under an Emergency Use Authorization (EUA). This EUA will remain  in effect (meaning this test can be used) for the duration of the COVID-19 declaration under Section 564(b)(1) of the Act, 21 U.S.C.section 360bbb-3(b)(1), unless the authorization is terminated  or  revoked sooner.       Influenza A by PCR NEGATIVE NEGATIVE Final   Influenza B by PCR NEGATIVE NEGATIVE Final    Comment: (NOTE) The Xpert Xpress SARS-CoV-2/FLU/RSV plus assay is intended as an aid in the diagnosis of influenza from Nasopharyngeal swab specimens and should not be used as a sole basis for treatment. Nasal washings and aspirates are unacceptable for Xpert Xpress SARS-CoV-2/FLU/RSV testing.  Fact Sheet for Patients: EntrepreneurPulse.com.au  Fact Sheet for Healthcare Providers: IncredibleEmployment.be  This test is not yet approved or cleared by the [place] FDA and has been authorized for detection and/or diagnosis of SARS-CoV-2 by FDA under an Emergency Use Authorization (EUA). This EUA will remain in effect (meaning this test can be used) for the duration of the COVID-19 declaration under Section 564(b)(1) of the Act, 21 U.S.C. section 360bbb-3(b)(1), unless the authorization is terminated or revoked.  Performed at [facility], [address] [place], [place] [address]     Time coordinating discharge: Over 30 minutes  SIGNED:  Lorella Nimrod, MD  [facility] 11/11/2020, 1:07 PM  If 7PM-7AM, please contact night-coverage [email or web address]  This record has been created using Systems analyst. Errors have been sought and corrected,but may not always be located. Such creation errors do not reflect on the standard of care.

## 2020-11-11 NOTE — ED Notes (Signed)
ED Provider at bedside. 

## 2020-11-11 NOTE — ED Notes (Signed)
Patient reports having episodes of central chest pain with shortness of breath since Friday.  Patient does state that when the pain got really bad she took her husband's nitro pills and the pain did go away at that time.  Patient states that since arriving she took a nap while waiting in the waiting room and does feel some better at this time.

## 2020-11-14 IMAGING — RF DG CERVICAL SPINE 2 OR 3 VIEWS
1 series · 2 of 2 positions shown · non-contrast
Comparison: MRI May 12, 2018.

CLINICAL DATA: Anterior surgical fusion of C4-5, C5-6 and C6-7.

EXAM:
DG C-ARM 61-120 MIN; CERVICAL SPINE - 2-3 VIEW
FLUOROSCOPY TIME:  12 seconds.

[Series 1: run · 2 of 2 slices shown]
[im 1/2]
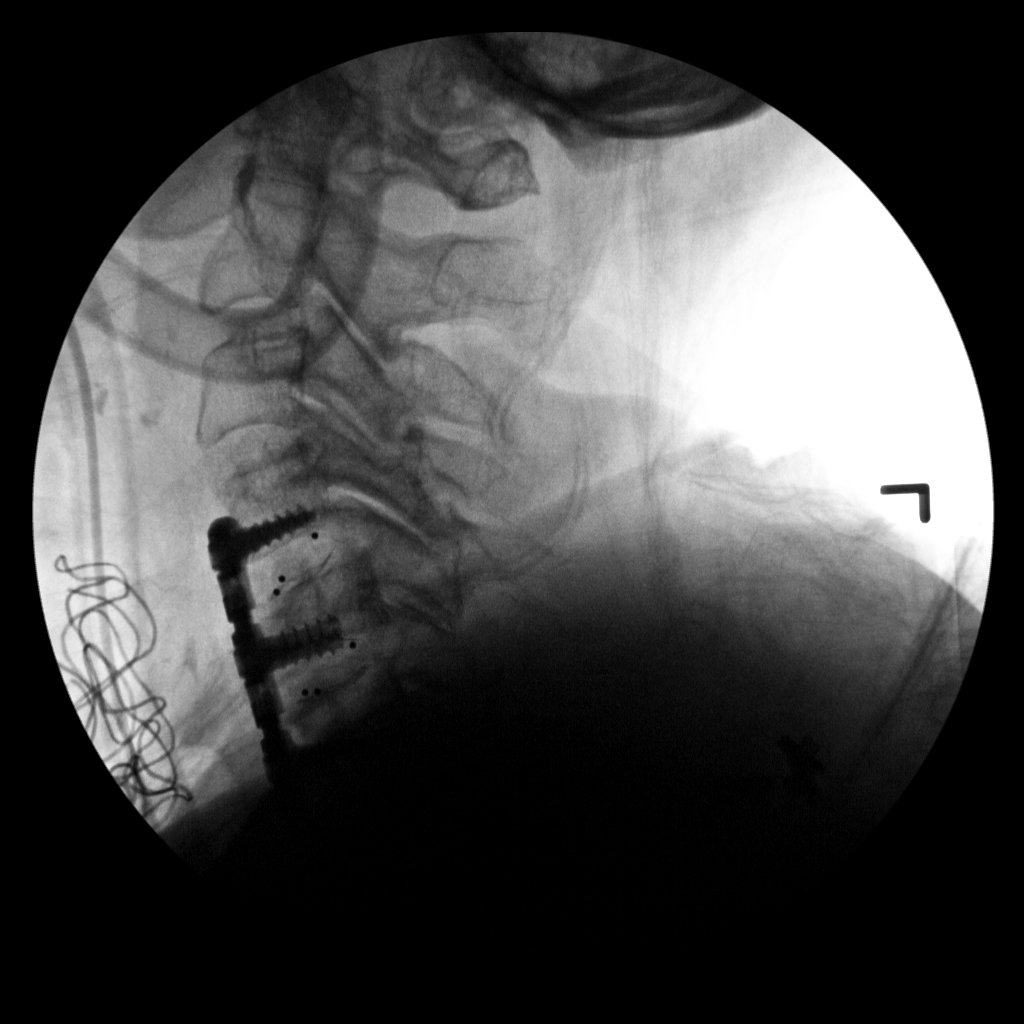
[im 2/2]
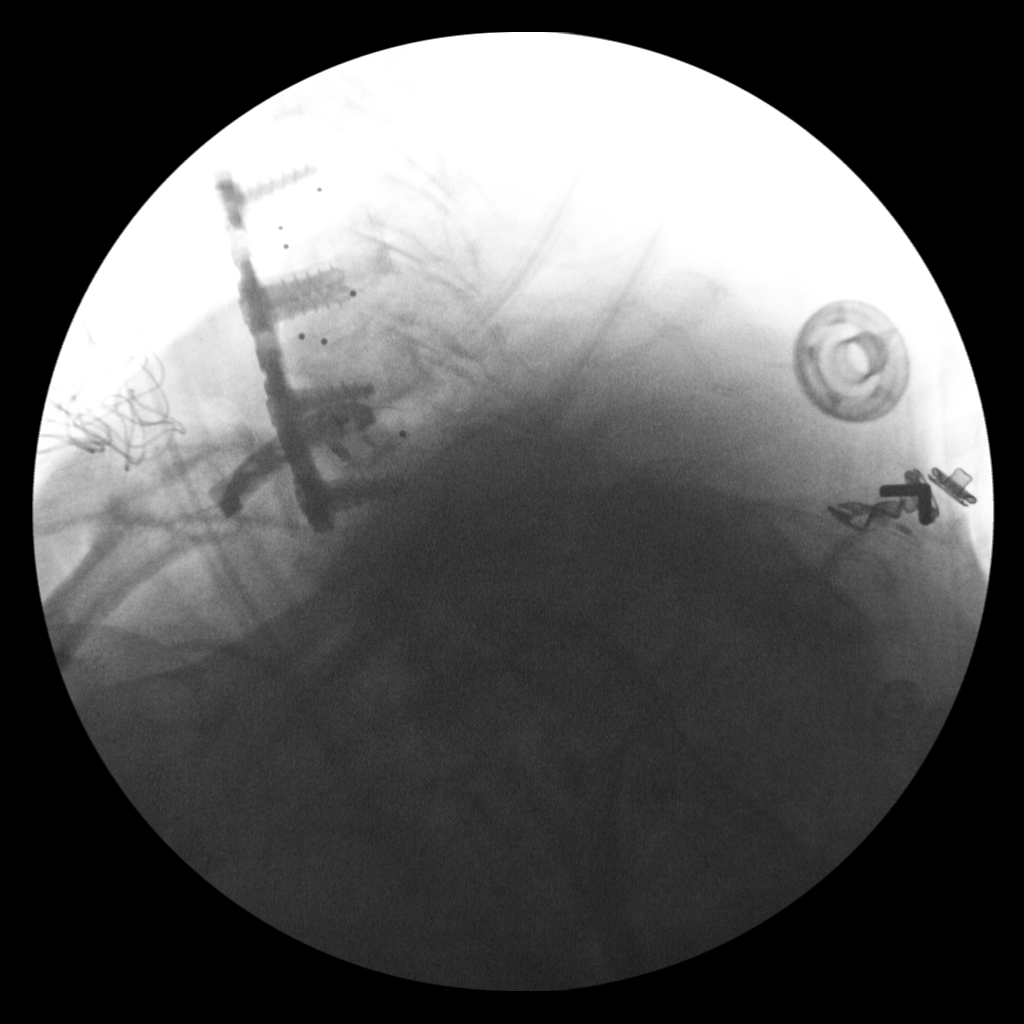

[2 of 2 positions shown; findings below may reference images not displayed]

FINDINGS: Two fluoroscopic images were obtained of the cervical spine. These
demonstrate the patient be status post surgical anterior fusion of
C4-5, C5-6 and C6-7. Good alignment of vertebral bodies is noted.
IMPRESSION: Status post surgical anterior fusion extending from C4-C7.

## 2020-11-18 ENCOUNTER — Other Ambulatory Visit: Payer: Medicare Other

## 2020-11-22 ENCOUNTER — Telehealth: Payer: Self-pay

## 2020-11-22 NOTE — Chronic Care Management (AMB) (Addendum)
Chronic Care Management Pharmacy Assistant   Name: Amanda [name]  MRN: [id number] DOB: [birth year]  Reason for Encounter: Medication Review  [name] was contacted to complete onboarding to [facility] per patient request at previous monthly medication sync call.  Reached patient at her husband's cell phone as her phone is currently not working. Patient is still interested in [facility] adherence packaging, but had some questions about her insurance. She was confused as to why she has a $75 premium starting 2022 for Medicare Part D. Report she did not sign up for Part D coverage. We reviewed her current active insurance and she does have [employer] Part D coverage. We discussed it is possible the premium is changing for 2022. Provided patient with [facility] contact information: [phone]. Encouraged her to call clinic to confirm [facility] is still the best plan for her. She would like to verify this prior to changing pharmacies. She reports she will call next week. Made patient aware open enrollment period is October 15-Dec 7.  Will follow up with patient in January.  PCP : Owens Loffler, MD  Allergies:   Allergies  Allergen Reactions   Honey Bee Venom [Bee Venom] Anaphylaxis   Diazepam Other (See Comments)    REACTION: pt states she gets very angry and abusive verbally on this medication     Medications: Outpatient Encounter Medications as of 11/22/2020  Medication Sig Note   acetaminophen (TYLENOL) 650 MG CR tablet Take 650 mg by mouth 3 times/day as needed-between meals & bedtime (for headaches).    albuterol (PROVENTIL HFA;VENTOLIN HFA) 108 (90 Base) MCG/ACT inhaler Inhale 2 puffs into the lungs every 4 (four) hours as needed for wheezing or shortness of breath.    amitriptyline (ELAVIL) 50 MG tablet TAKE 2 TABLETS BY MOUTH DAILY AT BEDTIME.    Ascorbic Acid (VITAMIN C) 1000 MG tablet Take 1,000 mg by mouth daily.    aspirin EC 81 MG tablet Take 81 mg by mouth daily.     buPROPion (WELLBUTRIN XL) 150 MG 24 hr tablet TAKE 1 TABLET (150 MG TOTAL) BY MOUTH DAILY.    calcium carbonate (TUMS - DOSED IN MG ELEMENTAL CALCIUM) 500 MG chewable tablet Chew 2 tablets by mouth daily as needed for indigestion or heartburn.    Cholecalciferol (VITAMIN D) 1000 UNITS capsule Take 1,000 Units by mouth daily.     clonazePAM (KLONOPIN) 0.5 MG tablet Take 1 tablet (0.5 mg total) by mouth 2 (two) times daily as needed.    cyclobenzaprine (FLEXERIL) 10 MG tablet TAKE 1 TABLET THREE TIMES DAILY AS NEEDED FOR MUSCLE SPAMS    ezetimibe (ZETIA) 10 MG tablet Take 1 tablet (10 mg total) by mouth daily. 11/11/2020: Not started   FLUoxetine (PROZAC) 40 MG capsule TAKE 1 CAPSULE (40 MG TOTAL) BY MOUTH DAILY.    metoprolol tartrate (LOPRESSOR) 25 MG tablet Take 1/2 (one-half) tablet by mouth twice daily    Multiple Vitamin (MULTIVITAMIN) tablet Take 1 tablet by mouth daily.    naproxen sodium (ALEVE) 220 MG tablet Take 220 mg by mouth daily. 10/17/2018: Scheduled    pantoprazole (PROTONIX) 40 MG tablet TAKE ONE TABLET BY MOUTH 30 MINUTES PRIOR TO BREAKFAST    polycarbophil (EQ FIBER THERAPY) 625 MG tablet Take 625 mg by mouth daily as needed. FIBER THERAPY  11/11/2020: Does not take every day, has not had in 3 days   rosuvastatin (CRESTOR) 40 MG tablet TAKE 1 TABLET (40 MG TOTAL) BY MOUTH DAILY.  Simethicone 125 MG CAPS Take 125 mg by mouth daily as needed (gas).    No facility-administered encounter medications on file as of 11/22/2020.    Current Diagnosis: Patient Active Problem List   Diagnosis Date Noted   ACS (acute coronary syndrome) ([place]) 11/11/2020   Unstable angina (HCC)    Nonrheumatic aortic valve stenosis    Myelopathy ([place]) 10/26/2018   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 sting    COPD (chronic obstructive pulmonary disease) ([place]) 10/30/2016   Gastroesophageal reflux disease 09/17/2016   Memory loss 10/29/2015   Primary osteoarthritis of right knee 06/16/2015   Smoker 05/10/2015   Solitary  pulmonary nodule 03/28/2013   Hypertension    Vitamin D deficiency 05/29/2009   Pure hypercholesterolemia 12/07/2007   Major depressive disorder, recurrent episode, in partial remission ([place]) 12/07/2007   Diabetes mellitus type 2, controlled ([place]) 08/10/2007   Follow-Up:  Pharmacist Review   Debbora Dus, CPP notified  Margaretmary Dys, [place] Pharmacy Assistant [phone]

## 2020-11-25 ENCOUNTER — Encounter: Payer: Self-pay | Admitting: Family Medicine

## 2020-11-25 ENCOUNTER — Ambulatory Visit (INDEPENDENT_AMBULATORY_CARE_PROVIDER_SITE_OTHER): Payer: Medicare Other | Admitting: Family Medicine

## 2020-11-25 ENCOUNTER — Other Ambulatory Visit: Payer: Self-pay

## 2020-11-25 VITALS — BP 130/70 | HR 92 | Temp 97.9°F | Ht 65.5 in | Wt 221.8 lb

## 2020-11-25 DIAGNOSIS — I2510 Atherosclerotic heart disease of native coronary artery without angina pectoris: Secondary | ICD-10-CM

## 2020-11-25 DIAGNOSIS — I200 Unstable angina: Secondary | ICD-10-CM | POA: Diagnosis not present

## 2020-11-25 DIAGNOSIS — Z79899 Other long term (current) drug therapy: Secondary | ICD-10-CM

## 2020-11-25 NOTE — Progress Notes (Signed)
Ewin Rehberg T. Danene Montijo, MD, [place] at [facility] [place], [address]  Phone: [phone]  FAX: [phone]  Amanda [name] - 69 y.o. female  MRN [id number]  Date of Birth: 11-Jul-1951  Date: 11/25/2020  PCP: Owens Loffler, MD  Referral: Owens Loffler, MD  Chief Complaint  Patient presents with  . Follow-up    Cardiology Visit    This visit occurred during the SARS-CoV-2 public health emergency.  Safety protocols were in place, including screening questions prior to the visit, additional usage of staff PPE, and extensive cleaning of exam room while observing appropriate contact time as indicated for disinfecting solutions.   Subjective:   Amanda [name] is a 69 y.o. very pleasant female patient with Body mass index is 36.34 kg/m. who presents with the following:  She is here to follow-up about some questions regarding her recent Cardiology visit.  She had unstable angina and had a L heart cath.  Admit date: 11/11/2020 Discharge date: 11/11/2020  Admitted From: Home Disposition: Home  Recommendations for Outpatient Follow-up:  1. Follow up with PCP in 1-2 weeks 2. Follow-up with cardiology in 2 to 3 weeks 3. Please obtain BMP/CBC in one week 4. Please follow up on the following pending results: None  Mild - mod nonobstructive coronary disease on cath.  Continue medical management.  Chest pain had resovled by the time she was released.  She is asymptomatic now.  Dr. Fletcher Anon changed his chol meds. - take crestor and zetia together  Review of Systems is noted in the HPI, as appropriate  Objective:   BP 130/70   Pulse 92   Temp 97.9 F (36.6 C) (Temporal)   Ht 5' 5.5" (1.664 m)   Wt 221 lb 12 oz (100.6 kg)   SpO2 97%   BMI 36.34 kg/m   GEN: No acute distress; alert,appropriate. PULM: Breathing comfortably in no respiratory distress PSYCH: Normally  interactive.  CV: RRR, no m/g/r  PULM: Normal respiratory rate, no accessory muscle use. No wheezes, crackles or rhonchi   Laboratory and Imaging Data:  Inpatient labs as well as radiology were reviewed with the patient  Assessment and Plan:     ICD-10-CM   1. Coronary artery disease involving native coronary artery of native heart without angina pectoris  I25.10   2. Unstable angina (HCC)  N98.9 Basic metabolic panel    CBC with Differential/Platelet  3. Encounter for long-term (current) use of medications  Q11.941 Basic metabolic panel    CBC with Differential/Platelet   At the time of her office visit, it was felt that she did have some angina.  She did have moderate amount of coronary disease.  Medical management was recommended.  She has tolerated her new medication changes.  She does continue to still smoke, and I strongly encouraged her to quit.  Follow-up hospital D/C labs.  There are no discontinued medications. Orders Placed This Encounter  Procedures  . Basic metabolic panel  . CBC with Differential/Platelet    Follow-up: No follow-ups on file.  Signed,  Maud Deed. Akbar Sacra, MD   Outpatient Encounter Medications as of 11/25/2020  Medication Sig  . acetaminophen (TYLENOL) 650 MG CR tablet Take 650 mg by mouth 3 times/day as needed-between meals & bedtime (for headaches).  Marland Kitchen albuterol (PROVENTIL HFA;VENTOLIN HFA) 108 (90 Base) MCG/ACT inhaler Inhale 2 puffs into the lungs every 4 (four) hours as needed for wheezing or  shortness of breath.  Marland Kitchen amitriptyline (ELAVIL) 50 MG tablet TAKE 2 TABLETS BY MOUTH DAILY AT BEDTIME.  Marland Kitchen Ascorbic Acid (VITAMIN C) 1000 MG tablet Take 1,000 mg by mouth daily.  Marland Kitchen aspirin EC 81 MG tablet Take 81 mg by mouth daily.  Marland Kitchen buPROPion (WELLBUTRIN XL) 150 MG 24 hr tablet TAKE 1 TABLET (150 MG TOTAL) BY MOUTH DAILY.  . calcium carbonate (TUMS - DOSED IN MG ELEMENTAL CALCIUM) 500 MG chewable tablet Chew 2 tablets by mouth daily as needed for  indigestion or heartburn.  . Cholecalciferol (VITAMIN D) 1000 UNITS capsule Take 1,000 Units by mouth daily.   . clonazePAM (KLONOPIN) 0.5 MG tablet Take 1 tablet (0.5 mg total) by mouth 2 (two) times daily as needed.  . cyclobenzaprine (FLEXERIL) 10 MG tablet TAKE 1 TABLET THREE TIMES DAILY AS NEEDED FOR MUSCLE SPAMS  . ezetimibe (ZETIA) 10 MG tablet Take 1 tablet (10 mg total) by mouth daily.  Marland Kitchen FLUoxetine (PROZAC) 40 MG capsule TAKE 1 CAPSULE (40 MG TOTAL) BY MOUTH DAILY.  . metoprolol tartrate (LOPRESSOR) 25 MG tablet Take 1/2 (one-half) tablet by mouth twice daily  . Multiple Vitamin (MULTIVITAMIN) tablet Take 1 tablet by mouth daily.  . naproxen sodium (ALEVE) 220 MG tablet Take 220 mg by mouth daily.  . pantoprazole (PROTONIX) 40 MG tablet TAKE ONE TABLET BY MOUTH 30 MINUTES PRIOR TO BREAKFAST  . polycarbophil (FIBERCON) 625 MG tablet Take 625 mg by mouth daily as needed. FIBER THERAPY  . rosuvastatin (CRESTOR) 40 MG tablet TAKE 1 TABLET (40 MG TOTAL) BY MOUTH DAILY.  . Simethicone 125 MG CAPS Take 125 mg by mouth daily as needed (gas).   No facility-administered encounter medications on file as of 11/25/2020.

## 2020-11-26 ENCOUNTER — Encounter: Payer: Self-pay | Admitting: Family Medicine

## 2020-11-26 DIAGNOSIS — I2510 Atherosclerotic heart disease of native coronary artery without angina pectoris: Secondary | ICD-10-CM | POA: Insufficient documentation

## 2020-11-26 HISTORY — DX: Atherosclerotic heart disease of native coronary artery without angina pectoris: I25.10

## 2020-11-26 LAB — CBC WITH DIFFERENTIAL/PLATELET
Basophils Absolute: 0.1 10*3/uL (ref 0.0–0.1)
Basophils Relative: 1.2 % (ref 0.0–3.0)
Eosinophils Absolute: 0.2 10*3/uL (ref 0.0–0.7)
Eosinophils Relative: 2.5 % (ref 0.0–5.0)
HCT: 36.5 % (ref 36.0–46.0)
Hemoglobin: 12.4 g/dL (ref 12.0–15.0)
Lymphocytes Relative: 22.7 % (ref 12.0–46.0)
Lymphs Abs: 1.7 10*3/uL (ref 0.7–4.0)
MCHC: 33.9 g/dL (ref 30.0–36.0)
MCV: 99.8 fl (ref 78.0–100.0)
Monocytes Absolute: 0.4 10*3/uL (ref 0.1–1.0)
Monocytes Relative: 5.9 % (ref 3.0–12.0)
Neutro Abs: 5.1 10*3/uL (ref 1.4–7.7)
Neutrophils Relative %: 67.7 % (ref 43.0–77.0)
Platelets: 240 10*3/uL (ref 150.0–400.0)
RBC: 3.66 Mil/uL — ABNORMAL LOW (ref 3.87–5.11)
RDW: 11.8 % (ref 11.5–15.5)
WBC: 7.5 10*3/uL (ref 4.0–10.5)

## 2020-11-26 LAB — BASIC METABOLIC PANEL
BUN: 12 mg/dL (ref 6–23)
CO2: 30 mEq/L (ref 19–32)
Calcium: 9.9 mg/dL (ref 8.4–10.5)
Chloride: 105 mEq/L (ref 96–112)
Creatinine, Ser: 0.85 mg/dL (ref 0.40–1.20)
GFR: 69.36 mL/min (ref >60.00)
Glucose, Bld: 90 mg/dL (ref 70–99)
Potassium: 4.2 mEq/L (ref 3.5–5.1)
Sodium: 139 mEq/L (ref 135–145)

## 2020-11-27 ENCOUNTER — Encounter: Payer: Self-pay | Admitting: *Deleted

## 2020-12-13 ENCOUNTER — Encounter: Payer: Self-pay | Admitting: Cardiovascular Disease

## 2020-12-13 ENCOUNTER — Telehealth (INDEPENDENT_AMBULATORY_CARE_PROVIDER_SITE_OTHER): Payer: Medicare Other | Admitting: Cardiovascular Disease

## 2020-12-13 ENCOUNTER — Telehealth: Payer: Self-pay | Admitting: Cardiovascular Disease

## 2020-12-13 VITALS — BP 122/64 | HR 81 | Temp 98.2°F | Ht 67.0 in | Wt 221.0 lb

## 2020-12-13 DIAGNOSIS — R002 Palpitations: Secondary | ICD-10-CM | POA: Diagnosis not present

## 2020-12-13 DIAGNOSIS — I25118 Atherosclerotic heart disease of native coronary artery with other forms of angina pectoris: Secondary | ICD-10-CM | POA: Diagnosis not present

## 2020-12-13 DIAGNOSIS — I350 Nonrheumatic aortic (valve) stenosis: Secondary | ICD-10-CM | POA: Diagnosis not present

## 2020-12-13 DIAGNOSIS — E785 Hyperlipidemia, unspecified: Secondary | ICD-10-CM

## 2020-12-13 DIAGNOSIS — Z720 Tobacco use: Secondary | ICD-10-CM

## 2020-12-13 NOTE — Telephone Encounter (Signed)
  Patient Consent for Virtual Visit         Amanda [name] has provided verbal consent on 12/13/2020 for a virtual visit (video or telephone).   CONSENT FOR VIRTUAL VISIT FOR:  Amanda [name]  By participating in this virtual visit I agree to the following:  I hereby voluntarily request, consent and authorize [facility] and its employed or contracted physicians, physician assistants, nurse practitioners or other licensed health care professionals (the Practitioner), to provide me with telemedicine health care services (the "Services") as deemed necessary by the treating Practitioner. I acknowledge and consent to receive the Services by the Practitioner via telemedicine. I understand that the telemedicine visit will involve communicating with the Practitioner through live audiovisual communication technology and the disclosure of certain medical information by electronic transmission. I acknowledge that I have been given the opportunity to request an in-person assessment or other available alternative prior to the telemedicine visit and am voluntarily participating in the telemedicine visit.  I understand that I have the right to withhold or withdraw my consent to the use of telemedicine in the course of my care at any time, without affecting my right to future care or treatment, and that the Practitioner or I may terminate the telemedicine visit at any time. I understand that I have the right to inspect all information obtained and/or recorded in the course of the telemedicine visit and may receive copies of available information for a reasonable fee.  I understand that some of the potential risks of receiving the Services via telemedicine include:  Marland Kitchen Delay or interruption in medical evaluation due to technological equipment failure or disruption; . Information transmitted may not be sufficient (e.g. poor resolution of images) to allow for appropriate medical decision making by the  Practitioner; and/or  . In rare instances, security protocols could fail, causing a breach of personal health information.  Furthermore, I acknowledge that it is my responsibility to provide information about my medical history, conditions and care that is complete and accurate to the best of my ability. I acknowledge that Practitioner's advice, recommendations, and/or decision may be based on factors not within their control, such as incomplete or inaccurate data provided by me or distortions of diagnostic images or specimens that may result from electronic transmissions. I understand that the practice of medicine is not an exact science and that Practitioner makes no warranties or guarantees regarding treatment outcomes. I acknowledge that a copy of this consent can be made available to me via my patient portal ([facility]), or I can request a printed copy by calling the office of [facility].    I understand that my insurance will be billed for this visit.   I have read or had this consent read to me. . I understand the contents of this consent, which adequately explains the benefits and risks of the Services being provided via telemedicine.  . I have been provided ample opportunity to ask questions regarding this consent and the Services and have had my questions answered to my satisfaction. . I give my informed consent for the services to be provided through the use of telemedicine in my medical care

## 2020-12-13 NOTE — Progress Notes (Signed)
Virtual Visit via Video Note   This visit type was conducted due to national recommendations for restrictions regarding the COVID-19 Pandemic (e.g. social distancing) in an effort to limit this patient's exposure and mitigate transmission in our community.  Due to her co-morbid illnesses, this patient is at least at moderate risk for complications without adequate follow up.  This format is felt to be most appropriate for this patient at this time.  All issues noted in this document were discussed and addressed.  A limited physical exam was performed with this format.  Please refer to the patient's chart for her consent to telehealth for [facility].  Video Connection Lost Video connection was lost at > 50% of the duration of this visit, at which time the remainder of the visit was completed via audio only.      Date:  12/13/2020   ID:  Amanda [name], DOB 1951/08/18, MRN [id number] The patient was identified using 2 identifiers.  Patient Location: Home Provider Location: Home Office  PCP:  Owens Loffler, MD  Cardiologist:  No primary care provider on file.  Electrophysiologist:  None   Evaluation Performed:  Follow-Up Visit  Chief Complaint: Feeling better with less shortness of breath he  History of Present Illness:    Amanda [name] is a 70 y.o. female who was seen via phone visit for follow-up.  We started as a video encounter and I was able to see her but there was a problem with her audio connection and I had to switch to a phone visit.  This is follow-up regarding moderate aortic stenosis and nonobstructive coronary artery disease.    She has other chronic medical conditions that include COPD, tobacco use, diet-controlled diabetes, hypertension and hyperlipidemia. CTA of the coronary arteries in September 2019 showed a calcium score of 521 with moderate nonobstructive disease affecting the RCA and LAD.  These were not significant by FFR. Echocardiogram in September  2020 showed an EF of 60 to 65% with moderate aortic stenosis.  Mean gradient was 19 mmHg with valve area of 1.28. She had a repeat echocardiogram recently which showed an EF of 60 to 83%, grade 1 diastolic dysfunction, moderate aortic stenosis with a mean gradient of 22 mmHg and valve area of 1.29.  She was hospitalized recently with chest pain and worsening exertional dyspnea. I proceeded with a right and left cardiac catheterization which showed mild nonobstructive coronary artery disease, right heart catheterization showed normal filling pressures, normal pulmonary pressure and normal cardiac output.  Aortic stenosis was moderate with a mean gradient of 24 mmHg and valve area of 1.44 cm.  She reports improvement in symptoms since her recent hospitalization and she thinks the symptoms are related to her COPD.  She is determined to quit smoking to be able to spend time with her granddaughter.   The patient does not have symptoms concerning for COVID-19 infection (fever, chills, cough, or new shortness of breath).    Past Medical History:  Diagnosis Date  . 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 sting    bee stings  . Anxiety   . Aortic stenosis    a. 2013: nl LV sys fxn, mild MR, no evidence of pulm htn; b. TTE 8/17: EF 60-65%, no RWMA, nl LV dia fxn,  mild AS, mod AI  . CAD (coronary artery disease), native coronary artery 11/26/2020  . COPD (chronic obstructive pulmonary disease) ([place]) 10/30/2016  . Degenerative arthritis of knee, bilateral 06/16/2015  . Depression   . Diabetes (  [place])    diet managed-type 11  . GERD (gastroesophageal reflux disease)   . Heart murmur   . Hiatal hernia with gastroesophageal reflux 1997  . Hyperlipidemia   . Hypertension   . Neuromuscular disorder ([place])    nerve damage in back and shoulder  . Osteoporosis   . Palpitations    a. 03/2012: 48-hour Holter monitor showed intermittent sinus tachycardia with occasional PACs, without other significant arrhythmia  . Thyroid disease     hyperthyroidism as a teenager - received some injections age 43 that corrected this   . Tobacco abuse    Past Surgical History:  Procedure Laterality Date  . ABDOMINAL HYSTERECTOMY  1993  . ANTERIOR CERVICAL DECOMP/DISCECTOMY FUSION N/A 10/26/2018   Procedure: ACDF C4-C5 C5-C6 C6-C7;  Surgeon: Kary Kos, MD;  Location: [facility];  Service: Neurosurgery;  Laterality: N/A;  . APPENDECTOMY    . BACK SURGERY    . BREAST BIOPSY Right 2007   benign  . CHOLECYSTECTOMY    . COLONOSCOPY     30 years ago was normal per pt.   Marland Kitchen DILATION AND CURETTAGE OF UTERUS    . ESOPHAGEAL DILATION     x 3  . fractured leg Right   . LEFT HEART CATH AND CORONARY ANGIOGRAPHY N/A 11/11/2020   Procedure: LEFT HEART CATH AND CORONARY ANGIOGRAPHY;  Surgeon: Wellington Hampshire, MD;  Location: [facility];  Service: Cardiovascular;  Laterality: N/A;  . OOPHORECTOMY    . OVARIAN CYST REMOVAL  1972  . SALIVARY GLAND SURGERY    . TONSILLECTOMY     4 yoa  . TOTAL KNEE ARTHROPLASTY Left 12/24/2015   Procedure: LEFT TOTAL KNEE ARTHROPLASTY;  Surgeon: [name], MD;  Location: [facility];  Service: Orthopedics;  Laterality: Left;  . TOTAL KNEE ARTHROPLASTY Right 12/30/2017   Procedure: RIGHT TOTAL KNEE ARTHROPLASTY;  Surgeon: [name], MD;  Location: [facility];  Service: Orthopedics;  Laterality: Right;  . WRIST FRACTURE SURGERY  11/2008     Current Meds  Medication Sig  . acetaminophen (TYLENOL) 650 MG CR tablet Take 650 mg by mouth 3 times/day as needed-between meals & bedtime (for headaches).  Marland Kitchen albuterol (PROVENTIL HFA;VENTOLIN HFA) 108 (90 Base) MCG/ACT inhaler Inhale 2 puffs into the lungs every 4 (four) hours as needed for wheezing or shortness of breath.  Marland Kitchen amitriptyline (ELAVIL) 50 MG tablet TAKE 2 TABLETS BY MOUTH DAILY AT BEDTIME.  Marland Kitchen Ascorbic Acid (VITAMIN C) 1000 MG tablet Take 1,000 mg by mouth daily.  Marland Kitchen aspirin EC 81 MG tablet Take 81 mg by mouth daily.  Marland Kitchen buPROPion (WELLBUTRIN XL) 150  MG 24 hr tablet TAKE 1 TABLET (150 MG TOTAL) BY MOUTH DAILY.  . calcium carbonate (TUMS - DOSED IN MG ELEMENTAL CALCIUM) 500 MG chewable tablet Chew 2 tablets by mouth daily as needed for indigestion or heartburn.  . Cholecalciferol (VITAMIN D) 1000 UNITS capsule Take 1,000 Units by mouth daily.   . clonazePAM (KLONOPIN) 0.5 MG tablet Take 1 tablet (0.5 mg total) by mouth 2 (two) times daily as needed.  . cyclobenzaprine (FLEXERIL) 10 MG tablet TAKE 1 TABLET THREE TIMES DAILY AS NEEDED FOR MUSCLE SPAMS  . ezetimibe (ZETIA) 10 MG tablet Take 1 tablet (10 mg total) by mouth daily.  Marland Kitchen FLUoxetine (PROZAC) 40 MG capsule TAKE 1 CAPSULE (40 MG TOTAL) BY MOUTH DAILY.  . metoprolol tartrate (LOPRESSOR) 25 MG tablet Take 1/2 (one-half) tablet by mouth twice daily  . Multiple Vitamin (  MULTIVITAMIN) tablet Take 1 tablet by mouth daily.  . naproxen sodium (ALEVE) 220 MG tablet Take 220 mg by mouth daily.  . pantoprazole (PROTONIX) 40 MG tablet TAKE ONE TABLET BY MOUTH 30 MINUTES PRIOR TO BREAKFAST  . polycarbophil (FIBERCON) 625 MG tablet Take 625 mg by mouth daily as needed. FIBER THERAPY  . rosuvastatin (CRESTOR) 40 MG tablet TAKE 1 TABLET (40 MG TOTAL) BY MOUTH DAILY.  . Simethicone 125 MG CAPS Take 125 mg by mouth daily as needed (gas).  . vitamin E 180 MG (400 UNITS) capsule Take 400 Units by mouth daily.     Allergies:   Honey bee venom [bee venom] and Diazepam   Social History   Tobacco Use  . Smoking status: Current Every Day Smoker    Packs/day: 0.75    Years: 45.00    Pack years: 33.75    Types: Cigarettes  . Smokeless tobacco: Never Used  . Tobacco comment: Smoking half PPD but is trying to quit.   Vaping Use  . Vaping Use: Never used  Substance Use Topics  . Alcohol use: Yes    Alcohol/week: 0.0 standard drinks    Comment: glass of wine twice a month   . Drug use: No     Family Hx: The patient's family history includes Arthritis in her mother; Breast cancer in her sister; Cancer  in her paternal grandmother; Colon polyps in her mother and sister; Diabetes in her mother; Heart disease in her mother; Hypercholesterolemia in her sister; Hyperlipidemia in her mother. There is no history of Colon cancer, Esophageal cancer, Rectal cancer, Stomach cancer, or Pancreatic cancer.  ROS:   Please see the history of present illness.     All other systems reviewed and are negative.   Prior CV studies:   The following studies were reviewed today:  I reviewed the results of cardiac catheterization with her.  Labs/Other Tests and Data Reviewed:    EKG:  No ECG reviewed.  Recent Labs: 08/08/2020: ALT 12 11/25/2020: BUN 12; Creatinine, Ser 0.85; Hemoglobin 12.4; Platelets 240.0; Potassium 4.2; Sodium 139   Recent Lipid Panel Lab Results  Component Value Date/Time   CHOL 156 08/08/2020 10:21 AM   CHOL 201 (H) 09/18/2013 10:16 AM   TRIG 104.0 08/08/2020 10:21 AM   HDL 49.50 08/08/2020 10:21 AM   HDL 53 09/18/2013 10:16 AM   CHOLHDL 3 08/08/2020 10:21 AM   LDLCALC 85 08/08/2020 10:21 AM   LDLCALC 130 (H) 09/18/2013 10:16 AM   LDLDIRECT 209.2 05/28/2009 09:09 AM    Wt Readings from Last 3 Encounters:  12/13/20 221 lb (100.2 kg)  11/25/20 221 lb 12 oz (100.6 kg)  11/11/20 221 lb (100.2 kg)     Risk Assessment/Calculations:      Objective:    Vital Signs:  BP 122/64   Pulse 81   Temp 98.2 F (36.8 C)   Ht 5\' 7"  (1.702 m)   Wt 221 lb (100.2 kg)   BMI 34.61 kg/m    VITAL SIGNS:  reviewed  ASSESSMENT & PLAN:    1.  Mild nonobstructive coronary artery disease: Recent cardiac catheterization showed only mild nonobstructive coronary artery disease.  Recommend continuing medical therapy and treating risk factors.  Continue low-dose aspirin.    2.  Aortic stenosis: This continues to be moderate with minimal progression in mean gradient.  Repeat echocardiogram in late 2022.  3.  Palpitations: Previous mild PACs. Symptoms are well-controlled on small dose  metoprolol.  4 . Tobacco  use: She is determined to quit smoking.  5.  Hyperlipidemia: Continue high-dose rosuvastatin.  Zetia was added recently.        COVID-19 Education: The signs and symptoms of COVID-19 were discussed with the patient and how to seek care for testing (follow up with PCP or arrange E-visit).  The importance of social distancing was discussed today.  Time:   Today, I have spent 12 minutes with the patient with telehealth technology discussing the above problems.     Medication Adjustments/Labs and Tests Ordered: Current medicines are reviewed at length with the patient today.  Concerns regarding medicines are outlined above.   Tests Ordered: No orders of the defined types were placed in this encounter.   Medication Changes: No orders of the defined types were placed in this encounter.   Follow Up:  In Person in 6 month(s)  Signed, Kathlyn Sacramento, MD  12/13/2020 11:35 AM    [place]

## 2020-12-13 NOTE — Patient Instructions (Signed)
Medication Instructions:  Your physician recommends that you continue on your current medications as directed. Please refer to the Current Medication list given to you today.  *If you need a refill on your cardiac medications before your next appointment, please call your pharmacy*   Lab Work: None ordered If you have labs (blood work) drawn today and your tests are completely normal, you will receive your results only by: . MyChart Message (if you have MyChart) OR . A paper copy in the mail If you have any lab test that is abnormal or we need to change your treatment, we will call you to review the results.   Testing/Procedures: None ordered   Follow-Up: At [facility], you and your health needs are our priority.  As part of our continuing mission to provide you with exceptional heart care, we have created designated Provider Care Teams.  These Care Teams include your primary Cardiologist (physician) and Advanced Practice Providers (APPs -  Physician Assistants and Nurse Practitioners) who all work together to provide you with the care you need, when you need it.  We recommend signing up for the patient portal called "MyChart".  Sign up information is provided on this After Visit Summary.  MyChart is used to connect with patients for Virtual Visits (Telemedicine).  Patients are able to view lab/test results, encounter notes, upcoming appointments, etc.  Non-urgent messages can be sent to your provider as well.   To learn more about what you can do with MyChart, go to [email or web address].    Your next appointment:   6 month(s)  The format for your next appointment:   In Person  Provider:   You may see Dr. Arida or one of the following Advanced Practice Providers on your designated Care Team:    Christopher Berge, NP  Ryan Dunn, PA-C  Jacquelyn Visser, PA-C  Cadence Furth, PA-C  Caitlin Walker, NP    Other Instructions N/A  

## 2020-12-18 ENCOUNTER — Telehealth: Payer: Self-pay

## 2020-12-18 ENCOUNTER — Telehealth: Payer: Medicare Other

## 2020-12-18 NOTE — Chronic Care Management (AMB) (Incomplete)
Chronic Care Management Pharmacy  Name: Amanda [name]  MRN: [id number] DOB: 12/28/1950  Chief Complaint/ HPI  Amanda [name],  70 y.o., female presents for their Initial CCM visit with the clinical pharmacist via telephone.  PCP : Amanda Loffler, MD  Their chronic conditions include: hypertension, hyperlipidemia, COPD, depression, tobacco use, type 2 diabetes, GERD, osteoarthritis  Patient concerns: denies med concerns; reports pain has worsened in right hand, needs to see PCP,  (have scheduler call for appt)  Office Visits:  02/19/20: Amanda [name] - recurrent falls; Suggested minimizing anticholinergic/sedating medications. Will start by changing benadryl (she's taking BID) to claritin daily. I also suggested she take flexeril 1/2 tab when used, or discontinue. To discuss amitriptyline (sedating) and klonopin use with PCP. Pt agrees with plan.   12/21/19: Amanda [name] - trigger finger (left hand), depo-medrol given  11/06/19: Amanda [name] - orthostatic hypotension leading to fall - move slowly upon standing  Consult Visit:  10/17/19: Cardiology - CAD, no angina, treat risk factors, LDL controlled, tobacco cessation   10/2019 ED - Fall  Allergies  Allergen Reactions  . Honey Bee Venom [Bee Venom] Anaphylaxis  . Diazepam Other (See Comments)    REACTION: pt states she gets very angry and abusive verbally on this medication    Medications: Outpatient Encounter Medications as of 12/18/2020  Medication Sig Note  . acetaminophen (TYLENOL) 650 MG CR tablet Take 650 mg by mouth 3 times/day as needed-between meals & bedtime (for headaches).   Marland Kitchen albuterol (PROVENTIL HFA;VENTOLIN HFA) 108 (90 Base) MCG/ACT inhaler Inhale 2 puffs into the lungs every 4 (four) hours as needed for wheezing or shortness of breath.   Marland Kitchen amitriptyline (ELAVIL) 50 MG tablet TAKE 2 TABLETS BY MOUTH DAILY AT BEDTIME.   Marland Kitchen Ascorbic Acid (VITAMIN C) 1000 MG tablet Take 1,000 mg by mouth daily.   Marland Kitchen aspirin EC 81 MG  tablet Take 81 mg by mouth daily.   Marland Kitchen buPROPion (WELLBUTRIN XL) 150 MG 24 hr tablet TAKE 1 TABLET (150 MG TOTAL) BY MOUTH DAILY.   . calcium carbonate (TUMS - DOSED IN MG ELEMENTAL CALCIUM) 500 MG chewable tablet Chew 2 tablets by mouth daily as needed for indigestion or heartburn.   . Cholecalciferol (VITAMIN D) 1000 UNITS capsule Take 1,000 Units by mouth daily.    . clonazePAM (KLONOPIN) 0.5 MG tablet Take 1 tablet (0.5 mg total) by mouth 2 (two) times daily as needed.   . cyclobenzaprine (FLEXERIL) 10 MG tablet TAKE 1 TABLET THREE TIMES DAILY AS NEEDED FOR MUSCLE SPAMS   . ezetimibe (ZETIA) 10 MG tablet Take 1 tablet (10 mg total) by mouth daily.   Marland Kitchen FLUoxetine (PROZAC) 40 MG capsule TAKE 1 CAPSULE (40 MG TOTAL) BY MOUTH DAILY.   . metoprolol tartrate (LOPRESSOR) 25 MG tablet Take 1/2 (one-half) tablet by mouth twice daily   . Multiple Vitamin (MULTIVITAMIN) tablet Take 1 tablet by mouth daily.   . naproxen sodium (ALEVE) 220 MG tablet Take 220 mg by mouth daily. 10/17/2018: Scheduled   . pantoprazole (PROTONIX) 40 MG tablet TAKE ONE TABLET BY MOUTH 30 MINUTES PRIOR TO BREAKFAST   . polycarbophil (FIBERCON) 625 MG tablet Take 625 mg by mouth daily as needed. FIBER THERAPY 11/11/2020: Does not take every day, has not had in 3 days  . rosuvastatin (CRESTOR) 40 MG tablet TAKE 1 TABLET (40 MG TOTAL) BY MOUTH DAILY.   . Simethicone 125 MG CAPS Take 125 mg by mouth daily as needed (gas).   . vitamin E  180 MG (400 UNITS) capsule Take 400 Units by mouth daily.    No facility-administered encounter medications on file as of 12/18/2020.   Current Diagnosis/Assessment: Emergency planning/management officer Strain: Low Risk   . Difficulty of Paying Living Expenses: Not very hard   Goals Addressed   None    Hypertension   CMP Latest Ref Rng & Units 11/25/2020 11/10/2020 08/08/2020  Glucose 70 - 99 mg/dL 90 94 152(H)  BUN 6 - 23 mg/dL 12 6(L) 11  Creatinine 0.40 - 1.20 mg/dL 0.85 0.78 0.81  Sodium 135 - 145 mEq/L 139  139 139  Potassium 3.5 - 5.1 mEq/L 4.2 3.8 4.3  Chloride 96 - 112 mEq/L 105 105 103  CO2 19 - 32 mEq/L 30 26 31   Calcium 8.4 - 10.5 mg/dL 9.9 9.4 9.3  Total Protein 6.0 - 8.3 g/dL - - 6.1  Total Bilirubin 0.2 - 1.2 mg/dL - - 0.5  Alkaline Phos 39 - 117 U/L - - 48  AST 0 - 37 U/L - - 15  ALT 0 - 35 U/L - - 12   Office blood pressures are: BP Readings from Last 3 Encounters:  12/13/20 122/64  11/25/20 130/70  11/11/20 118/65   BP goal < 140/90 mmHg Patient has failed these meds in the past: none Patient checks BP at home: occasionally  Patient home BP readings are ranging: none reported  Patient is currently controlled on the following medications:   Metoprolol tartrate 25 mg - 1/2 tablet BID  Plan: Continue current medications  Hyperlipidemia   Lipid Panel     Component Value Date/Time   CHOL 156 08/08/2020 1021   CHOL 201 (H) 09/18/2013 1016   TRIG 104.0 08/08/2020 1021   HDL 49.50 08/08/2020 1021   HDL 53 09/18/2013 1016   CHOLHDL 3 08/08/2020 1021   VLDL 20.8 08/08/2020 1021   LDLCALC 85 08/08/2020 1021   LDLCALC 130 (H) 09/18/2013 1016   LDLDIRECT 209.2 05/28/2009 0909   LABVLDL 18 09/18/2013 1016     CBC Latest Ref Rng & Units 11/25/2020 11/10/2020 08/08/2020  WBC 4.0 - 10.5 K/uL 7.5 5.6 5.4  Hemoglobin 12.0 - 15.0 g/dL 12.4 12.5 12.9  Hematocrit 36.0 - 46.0 % 36.5 37.2 38.3  Platelets 150.0 - 400.0 K/uL 240.0 183 191.0   LDL goal < 70 (CAD) Patient has failed these meds in past: none Patient is currently controlled on the following medications:   Rosuvastatin 40 mg - 1 tablet daily  Aspirin 81 mg - 1 tablet daily  We discussed: CBC stable; currently out of rosuvastatin (some issues with coordination of mail order pharmacy)  Plan: Continue current medications  COPD/ Tobacco   Last spirometry score: 07/2018; FEV1 % predicted  - 85% Gold Grade: Gold 1 (FEV1>80%)  Eosinophil count:   Lab Results  Component Value Date/Time   EOSPCT 2.5 11/25/2020  03:27 PM  %                               Eos (Absolute):  Lab Results  Component Value Date/Time   EOSABS 0.2 11/25/2020 03:27 PM   Tobacco Status:  Social History   Tobacco Use  Smoking Status Current Every Day Smoker  . Packs/day: 0.75  . Years: 45.00  . Pack years: 33.75  . Types: Cigarettes  Smokeless Tobacco Never Used  Tobacco Comment   Smoking half PPD but is trying to quit.    Patient has failed  these meds in past: none reported Patient is currently controlled on the following medications:   Albuterol 90 mcg - 2 puffs every 4 hours PRN  Using maintenance inhaler regularly? None prescribed Frequency of rescue inhaler use: once per week   We discussed: trying to cut back on cigarette use - current use 1/2 PPD; albuterol - mainly uses when its humid/allergies - about once per week  Plan: Continue current medications; Continue progress towards smoking cessation.   Diabetes   Recent Relevant Labs: Lab Results  Component Value Date/Time   HGBA1C 6.3 08/08/2020 10:21 AM   HGBA1C 6.5 05/31/2019 08:13 AM   MICROALBUR 0.8 08/08/2020 10:21 AM   MICROALBUR <0.7 05/31/2019 08:13 AM    A1c goal < 7% Checking BG: Rarely (if symptomatic)  Patient has failed these meds in past: none reported Patient is currently controlled on the following medications:   No pharmacotherapy    We discussed: limits dietary intake of sugar/carbs; has gained some weight in the past year; currently working on reducing snacking    Plan: Continue current medications; Continue to eat a heart healthy diet low in carbohydrates.  Depression    Patient has failed these meds in past: none reported Patient is currently controlled on the following medications:   Fluoxetine 40 mg - 1 capsule daily  Bupropion 150 mg - 1 tablet daily (started after dose reduction in fluoxetine)  Clonazepam 0.5 mg - 1 tablet BID PRN  We discussed: previously on fluoxetine 40 mg BID, reduced to 1 tablet due to  brain fog; discussed clonazepam and fall risk - pt reports falls are due to moving too quickly/loss of balance and knows she needs to be more careful; reports taking clonazepam1/2 tablet AM and late afternoon; recommended moving one dose to nighttime to reduce daytime drowsiness  Plan: Continue current medications; Try moving afternoon clonazepam to nighttime. If tolerated, try reducing to 1/2 tablet at bedtime only.   Osteoarthritis right knee   Patient has failed these meds in past: none reported Patient is currently controlled on the following medications:   Tylenol Arthritis 650 mg - 1 tablet TID PRN (if pain unresolved - usually 1 at dinnertime)  Naproxen 220 mg - 1 tablet daily (every AM)  Cyclobenzaprine 10 mg - 1 tablet TID PRN   Amitriptyline 50 mg - 2 tablets qhs (nerve pain)  We discussed: reports amitriptyline helps a lot with nerve pain in back  Plan: Continue current medications  GERD   Patient has failed these meds in past: none  Patient is currently controlled on the following medications:   Pantoprazole 40 mg - 1 tablet daily before breakfast  We discussed: reports working well, takes with morning meds; occasionally some food gets stuck in throat--> uses chewable for heartburn/gas (Equate brand) 750 mg calcium carbonate/80 mg simethicone  Plan: Continue current medications; Try moving pantoprazole to 30 minutes before breakfast to see if symptoms improve.   Vaccines   Reviewed and discussed patient's vaccination history.    Immunization History  Administered Date(s) Administered  . Fluad Quad(high Dose 65+) 08/15/2020  . Influenza Split 09/09/2011, 10/10/2012  . Influenza Whole 09/20/2006  . Influenza, High Dose Seasonal PF 08/15/2019  . Influenza,inj,Quad PF,6+ Mos 09/18/2015, 09/16/2016, 09/13/2017  . PFIZER SARS-COV-2 Vaccination 01/25/2020, 02/01/2020  . PPD Test 12/27/2015  . Pneumococcal Conjugate-13 09/18/2015  . Pneumococcal Polysaccharide-23  11/23/2016  . Td 12/26/1998, 09/13/2017   Plan: Recommended patient receive Shingrix  Medication Management  OTCs: simethicone 125 mg - PRN,  Fiber 625 mg daily, naproxen 220 mg - daily, multivitamin, loratadine 10 mg daily, Mucinex 600 mg - BID PRN, magnesium oxide 400 mg - daily for muscle cramps, vitamin C 1000 mg - daily, vitamin D 1000 IU daily (WNL - 2018)  Pharmacy/Benefits: [employer] Part D/Mail Order   Adherence: uses pillbox   Affordability: denies concerns  CCM Follow Up: 6 months (telephone)  Debbora Dus, PharmD Clinical Pharmacist [facility] at [facility] [phone]

## 2020-12-18 NOTE — Telephone Encounter (Signed)
  Chronic Care Management   Outreach Note  12/18/2020 Name: Amanda [name] MRN: [id number] DOB: [birth year]  Referred by: Owens Loffler, MD  Attempted to reach patient by telephone for follow up CCM visit today at 9:30 AM. Left voicemail with contact information.  Debbora Dus, PharmD Clinical Pharmacist [facility] at [facility] [phone]

## 2020-12-24 ENCOUNTER — Other Ambulatory Visit: Payer: Self-pay | Admitting: Family Medicine

## 2021-01-06 ENCOUNTER — Telehealth: Payer: Self-pay

## 2021-01-06 NOTE — Chronic Care Management (AMB) (Signed)
Chronic Care Management Pharmacy Assistant   Name: Amanda [name]  MRN: [id number] DOB: Mar 14, 1951  Reason for Encounter: Schedule CCM follow up appointment  Patient Questions:  1.  Have you seen any other providers since your last visit? Yes 12/13/20- Dr. Kathlyn [place]- Televisit- Cardiology 11/25/20- Dr. Frederico Hamman Copland- PCP   2.  Any changes in your medicines or health? No     PCP : Amanda Loffler, MD  Allergies:   Allergies  Allergen Reactions  . Honey Bee Venom [Bee Venom] Anaphylaxis  . Diazepam Other (See Comments)    REACTION: pt states she gets very angry and abusive verbally on this medication     Medications: Outpatient Encounter Medications as of 01/06/2021  Medication Sig Note  . acetaminophen (TYLENOL) 650 MG CR tablet Take 650 mg by mouth 3 times/day as needed-between meals & bedtime (for headaches).   Marland Kitchen albuterol (PROVENTIL HFA;VENTOLIN HFA) 108 (90 Base) MCG/ACT inhaler Inhale 2 puffs into the lungs every 4 (four) hours as needed for wheezing or shortness of breath.   Marland Kitchen amitriptyline (ELAVIL) 50 MG tablet TAKE 2 TABLETS BY MOUTH DAILY AT BEDTIME.   Marland Kitchen Ascorbic Acid (VITAMIN C) 1000 MG tablet Take 1,000 mg by mouth daily.   Marland Kitchen aspirin EC 81 MG tablet Take 81 mg by mouth daily.   Marland Kitchen buPROPion (WELLBUTRIN XL) 150 MG 24 hr tablet TAKE 1 TABLET (150 MG TOTAL) BY MOUTH DAILY.   . calcium carbonate (TUMS - DOSED IN MG ELEMENTAL CALCIUM) 500 MG chewable tablet Chew 2 tablets by mouth daily as needed for indigestion or heartburn.   . Cholecalciferol (VITAMIN D) 1000 UNITS capsule Take 1,000 Units by mouth daily.    . clonazePAM (KLONOPIN) 0.5 MG tablet Take 1 tablet (0.5 mg total) by mouth 2 (two) times daily as needed.   . cyclobenzaprine (FLEXERIL) 10 MG tablet TAKE 1 TABLET THREE TIMES DAILY AS NEEDED FOR MUSCLE SPAMS   . ezetimibe (ZETIA) 10 MG tablet Take 1 tablet (10 mg total) by mouth daily.   Marland Kitchen FLUoxetine (PROZAC) 40 MG capsule TAKE 1 CAPSULE (40 MG TOTAL)  BY MOUTH DAILY.   . metoprolol tartrate (LOPRESSOR) 25 MG tablet Take 1/2 (one-half) tablet by mouth twice daily   . Multiple Vitamin (MULTIVITAMIN) tablet Take 1 tablet by mouth daily.   . naproxen sodium (ALEVE) 220 MG tablet Take 220 mg by mouth daily. 10/17/2018: Scheduled   . pantoprazole (PROTONIX) 40 MG tablet TAKE 1 TABLET 30 MINUTES PRIOR TO BREAKFAST   . polycarbophil (FIBERCON) 625 MG tablet Take 625 mg by mouth daily as needed. FIBER THERAPY 11/11/2020: Does not take every day, has not had in 3 days  . rosuvastatin (CRESTOR) 40 MG tablet TAKE 1 TABLET (40 MG TOTAL) BY MOUTH DAILY.   . Simethicone 125 MG CAPS Take 125 mg by mouth daily as needed (gas).   . vitamin E 180 MG (400 UNITS) capsule Take 400 Units by mouth daily.    No facility-administered encounter medications on file as of 01/06/2021.    Current Diagnosis: Patient Active Problem List   Diagnosis Date Noted  . CAD (coronary artery disease), native coronary artery 11/26/2020  . ACS (acute coronary syndrome) ([place]) 11/11/2020  . Unstable angina ([place])   . Nonrheumatic aortic valve stenosis   . Myelopathy ([place]) 10/26/2018  . 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 sting   . COPD (chronic obstructive pulmonary disease) ([place]) 10/30/2016  . Gastroesophageal reflux disease 09/17/2016  . Memory loss 10/29/2015  . Primary  osteoarthritis of right knee 06/16/2015  . Smoker 05/10/2015  . Solitary pulmonary nodule 03/28/2013  . Hypertension   . Vitamin D deficiency 05/29/2009  . Pure hypercholesterolemia 12/07/2007  . Major depressive disorder, recurrent episode, in partial remission ([place]) 12/07/2007  . Diabetes mellitus type 2, controlled ([place]) 08/10/2007   Contacted Amanda [name] to reschedule her missed appointment on 12/18/20  with Amanda [name], Pharm. D. Patient was able to reschedule to 01/23/21 at 2:30 for face to face appointment.    Are you having any problems with your medications? Not at this time   What concerns would like to discuss  with the pharmacist? Would like to discuss packaging at visit 01/23/21  Patient reminded to have all medications, supplements and any blood sugar and blood pressure readings available for review with Amanda [name], Pharm. D, at her office visit on 01/23/21.     Follow-Up:  Pharmacist Review and Scheduled Follow-Up With Clinical Pharmacist   Amanda [name], CPP notified  Amanda [name], [place] Pharmacy Assistant [phone]

## 2021-01-22 ENCOUNTER — Other Ambulatory Visit: Payer: Self-pay | Admitting: Cardiovascular Disease

## 2021-01-23 ENCOUNTER — Ambulatory Visit: Payer: Medicare Other

## 2021-01-23 NOTE — Progress Notes (Deleted)
Chronic Care Management Pharmacy Note  01/23/2021 Name:  Amanda [name] MRN:  [id number] DOB:  [birth year]  Subjective: Amanda [name] is an 70 y.o. year old female who is a primary patient of Copland, Amanda Hamman, MD.  The CCM team was consulted for assistance with disease management and care coordination needs.    Engaged with patient face to face for follow up visit in response to provider referral for pharmacy case management and/or care coordination services.   Consent to Services:  The patient was given information about Chronic Care Management services, agreed to services, and gave verbal consent prior to initiation of services.  Please see initial visit note for detailed documentation.   Patient Care Team: Owens Loffler, MD as PCP - General (Family Medicine) Debbora Dus, [facility] as Pharmacist (Pharmacist)  Recent office visits: 11/25/20 - PCP - She is here to follow-up about some questions regarding her recent Cardiology visit. She had unstable angina and had a L heart cath. Chest pain had resovled by the time she was released.  She is asymptomatic now. Dr. Fletcher Anon changed his chol meds. - take crestor and zetia together. She does continue to still smoke, and I strongly encouraged her to quit.  Recent consult visits: 12/13/20 - Cardiology - She reports improvement in symptoms since her recent hospitalization and she thinks the symptoms are related to her COPD.  She is determined to quit smoking to be able to spend time with her granddaughter. Hyperlipidemia: Continue high-dose rosuvastatin.  Zetia was added recently.  Hospital visits:   Objective:  Lab Results  Component Value Date   CREATININE 0.85 11/25/2020   BUN 12 11/25/2020   GFR 69.36 11/25/2020   GFRNONAA >60 11/10/2020   GFRAA >60 10/24/2019   NA 139 11/25/2020   K 4.2 11/25/2020   CALCIUM 9.9 11/25/2020   CO2 30 11/25/2020    Lab Results  Component Value Date/Time   HGBA1C 6.3 08/08/2020 10:21 AM   HGBA1C  6.5 05/31/2019 08:13 AM   GFR 69.36 11/25/2020 03:27 PM   GFR 69.86 08/08/2020 10:21 AM   MICROALBUR 0.8 08/08/2020 10:21 AM   MICROALBUR <0.7 05/31/2019 08:13 AM    Last diabetic Eye exam: No results found for: HMDIABEYEEXA  Last diabetic Foot exam:  Lab Results  Component Value Date/Time   HMDIABFOOTEX done 12/18/2010 12:00 AM     Lab Results  Component Value Date   CHOL 156 08/08/2020   HDL 49.50 08/08/2020   LDLCALC 85 08/08/2020   LDLDIRECT 209.2 05/28/2009   TRIG 104.0 08/08/2020   CHOLHDL 3 08/08/2020    Hepatic Function Latest Ref Rng & Units 08/08/2020 10/24/2019 12/15/2018  Total Protein 6.0 - 8.3 g/dL 6.1 6.9 6.7  Albumin 3.5 - 5.2 g/dL 4.1 4.1 4.2  AST 0 - 37 U/L '15 21 26  ' ALT 0 - 35 U/L '12 16 22  ' Alk Phosphatase 39 - 117 U/L 48 40 48  Total Bilirubin 0.2 - 1.2 mg/dL 0.5 0.8 0.6  Bilirubin, Direct 0.0 - 0.3 mg/dL 0.1 - <0.1    Lab Results  Component Value Date/Time   TSH 0.70 06/02/2017 01:27 PM   TSH 0.90 01/27/2017 04:24 PM    CBC Latest Ref Rng & Units 11/25/2020 11/10/2020 08/08/2020  WBC 4.0 - 10.5 K/uL 7.5 5.6 5.4  Hemoglobin 12.0 - 15.0 g/dL 12.4 12.5 12.9  Hematocrit 36.0 - 46.0 % 36.5 37.2 38.3  Platelets 150.0 - 400.0 K/uL 240.0 183 191.0    Lab Results  Component  Value Date/Time   VD25OH 41.56 08/08/2020 10:21 AM   VD25OH 43.96 06/02/2017 01:27 PM    Clinical ASCVD: Yes  The 10-year ASCVD risk score Mikey Bussing DC Jr., et al., 2013) is: 30.9%   Values used to calculate the score:     Age: 78 years     Sex: Female     Is Non-Hispanic African American: No     Diabetic: Yes     Tobacco smoker: Yes     Systolic Blood Pressure: 916 mmHg     Is BP treated: Yes     HDL Cholesterol: 49.5 mg/dL     Total Cholesterol: 156 mg/dL    Depression screen [facility] 2/9 08/15/2020 05/30/2019 06/02/2017  Decreased Interest 1 0 0  Down, Depressed, Hopeless 1 0 0  PHQ - 2 Score 2 0 0  Altered sleeping 2 0 -  Tired, decreased energy 3 0 -  Change in appetite 1 0 -   Feeling bad or failure about yourself  1 0 -  Trouble concentrating 3 0 -  Moving slowly or fidgety/restless 3 0 -  Suicidal thoughts 0 0 -  PHQ-9 Score 15 0 -  Difficult doing work/chores Somewhat difficult Not difficult at all -  Some recent data might be hidden     Social History   Tobacco Use  Smoking Status Current Every Day Smoker  . Packs/day: 0.75  . Years: 45.00  . Pack years: 33.75  . Types: Cigarettes  Smokeless Tobacco Never Used  Tobacco Comment   Smoking half PPD but is trying to quit.    BP Readings from Last 3 Encounters:  12/13/20 122/64  11/25/20 130/70  11/11/20 118/65   Pulse Readings from Last 3 Encounters:  12/13/20 81  11/25/20 92  11/11/20 85   Wt Readings from Last 3 Encounters:  12/13/20 221 lb (100.2 kg)  11/25/20 221 lb 12 oz (100.6 kg)  11/11/20 221 lb (100.2 kg)    Assessment/Interventions: Review of patient past medical history, allergies, medications, health status, including review of consultants reports, laboratory and other test data, was performed as part of comprehensive evaluation and provision of chronic care management services.   SDOH:  (Social Determinants of Health) assessments and interventions performed: {yes/no:20286}   CCM Care Plan  Allergies  Allergen Reactions  . Honey Bee Venom [Bee Venom] Anaphylaxis  . Diazepam Other (See Comments)    REACTION: pt states she gets very angry and abusive verbally on this medication     Medications Reviewed Today    Reviewed by Anselm Pancoast, CMA (Certified Medical Assistant) on 12/13/20 at 1107  Med List Status: <None>  Medication Order Taking? Sig Documenting Provider Last Dose Status Informant  acetaminophen (TYLENOL) 650 MG CR tablet [id number] Yes Take 650 mg by mouth 3 times/day as needed-between meals & bedtime (for headaches). [provider] Taking Active Self  albuterol (PROVENTIL HFA;VENTOLIN HFA) 108 (90 Base) MCG/ACT inhaler [id number] Yes Inhale 2 puffs  into the lungs every 4 (four) hours as needed for wheezing or shortness of breath. Copland, Amanda Hamman, MD Taking Active Self  amitriptyline (ELAVIL) 50 MG tablet [id number] Yes TAKE 2 TABLETS BY MOUTH DAILY AT BEDTIME. Copland, Amanda Hamman, MD Taking Active Self  Ascorbic Acid (VITAMIN C) 1000 MG tablet [id number] Yes Take 1,000 mg by mouth daily. [provider] Taking Active Self  aspirin EC 81 MG tablet [id number] Yes Take 81 mg by mouth daily. [provider] Taking Active Self  buPROPion (WELLBUTRIN XL) 150 MG  24 hr tablet [id number] Yes TAKE 1 TABLET (150 MG TOTAL) BY MOUTH DAILY. Copland, Amanda Hamman, MD Taking Active Self  calcium carbonate (TUMS - DOSED IN MG ELEMENTAL CALCIUM) 500 MG chewable tablet [id number] Yes Chew 2 tablets by mouth daily as needed for indigestion or heartburn. [provider] Taking Active Self  Cholecalciferol (VITAMIN D) 1000 UNITS capsule [id number] Yes Take 1,000 Units by mouth daily.  [provider] Taking Active Self  clonazePAM (KLONOPIN) 0.5 MG tablet [id number] Yes Take 1 tablet (0.5 mg total) by mouth 2 (two) times daily as needed. Copland, Amanda Hamman, MD Taking Active Self  cyclobenzaprine (FLEXERIL) 10 MG tablet [id number] Yes TAKE 1 TABLET THREE TIMES DAILY AS NEEDED FOR MUSCLE SPAMS Copland, Spencer, MD Taking Active Self  ezetimibe (ZETIA) 10 MG tablet [id number] Yes Take 1 tablet (10 mg total) by mouth daily. Wellington Hampshire, MD Taking Active Self           Med Note Burnadette Peter, Chrystie Nose   Fri Dec 13, 2020 11:06 AM)    FLUoxetine (PROZAC) 40 MG capsule [id number] Yes TAKE 1 CAPSULE (40 MG TOTAL) BY MOUTH DAILY. Copland, Amanda Hamman, MD Taking Active Self  metoprolol tartrate (LOPRESSOR) 25 MG tablet [id number] Yes Take 1/2 (one-half) tablet by mouth twice daily Wellington Hampshire, MD Taking Active Self  Multiple Vitamin (MULTIVITAMIN) tablet [id number] Yes Take 1 tablet by mouth daily. [provider] Taking Active Self  naproxen sodium  (ALEVE) 220 MG tablet [id number] Yes Take 220 mg by mouth daily. [provider] Taking Active Self           Med Note Jilda Roche A   Mon Oct 17, 2018  2:17 PM) Scheduled   pantoprazole (PROTONIX) 40 MG tablet [id number] Yes TAKE ONE TABLET BY MOUTH 30 MINUTES PRIOR TO BREAKFAST Copland, Spencer, MD Taking Active Self  polycarbophil (FIBERCON) 625 MG tablet [id number] Yes Take 625 mg by mouth daily as needed. FIBER THERAPY [provider] Taking Active Self           Med Note Dale Elsa Nov 11, 2020  6:30 AM) Does not take every day, has not had in 3 days  rosuvastatin (CRESTOR) 40 MG tablet [id number] Yes TAKE 1 TABLET (40 MG TOTAL) BY MOUTH DAILY. Owens Loffler, MD Taking Active Self  Simethicone 125 MG CAPS [id number] Yes Take 125 mg by mouth daily as needed (gas). [provider] Taking Active Self          Patient Active Problem List   Diagnosis Date Noted  . CAD (coronary artery disease), native coronary artery 11/26/2020  . ACS (acute coronary syndrome) ([place]) 11/11/2020  . Unstable angina ([place])   . Nonrheumatic aortic valve stenosis   . Myelopathy ([place]) 10/26/2018  . 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 sting   . COPD (chronic obstructive pulmonary disease) ([place]) 10/30/2016  . Gastroesophageal reflux disease 09/17/2016  . Memory loss 10/29/2015  . Primary osteoarthritis of right knee 06/16/2015  . Smoker 05/10/2015  . Solitary pulmonary nodule 03/28/2013  . Hypertension   . Vitamin D deficiency 05/29/2009  . Pure hypercholesterolemia 12/07/2007  . Major depressive disorder, recurrent episode, in partial remission ([place]) 12/07/2007  . Diabetes mellitus type 2, controlled ([place]) 08/10/2007    Immunization History  Administered Date(s) Administered  . Fluad Quad(high Dose 65+) 08/15/2020  . Influenza Split 09/09/2011, 10/10/2012  . Influenza Whole 09/20/2006  . Influenza, High Dose Seasonal PF 08/15/2019  . Influenza,inj,Quad PF,6+ Mos 09/18/2015,  09/16/2016, 09/13/2017  .  PFIZER(Purple Top)SARS-COV-2 Vaccination 01/25/2020, 02/01/2020  . PPD Test 12/27/2015  . Pneumococcal Conjugate-13 09/18/2015  . Pneumococcal Polysaccharide-23 11/23/2016  . Td 12/26/1998, 09/13/2017    Conditions to be addressed/monitored:  {USCCMDZASSESSMENTOPTIONS:23563}  There are no care plans that you recently modified to display for this patient.   Current Barriers:  . {pharmacybarriers:24917} . ***  Pharmacist Clinical Goal(s):  Marland Kitchen Over the next *** days, patient will {PHARMACYGOALCHOICES:24921} through collaboration with PharmD and provider.  . ***  Interventions: . 1:1 collaboration with Owens Loffler, MD regarding development and update of comprehensive plan of care as evidenced by provider attestation and co-signature . Inter-disciplinary care team collaboration (see longitudinal plan of care) . Comprehensive medication review performed; medication list updated in electronic medical record  Hypertension (BP goal {CHL HP UPSTREAM Pharmacist BP [phone]}) -{CHL Controlled/Uncontrolled:[phone]} -Current treatment: . *** -Medications previously tried: ***  -Current home readings: *** -Current dietary habits: *** -Current exercise habits: *** -{ACTIONS;DENIES/REPORTS:21021675::"Denies"} hypotensive/hypertensive symptoms -Educated on {CCM BP Counseling:25124} -Counseled to monitor BP at home ***, document, and provide log at future appointments -{CCMPHARMDINTERVENTION:25122}  Hyperlipidemia: (LDL goal < ***) -{CHL Controlled/Uncontrolled:[phone]} -Current treatment: . *** -Medications previously tried: ***  -Current dietary patterns: *** -Current exercise habits: *** -Educated on {CCM HLD Counseling:25126} -{CCMPHARMDINTERVENTION:25122}  Diabetes (A1c goal {A1c goals:23924}) -{CHL Controlled/Uncontrolled:[phone]} -Current medications: . *** -Medications previously tried: ***  -Current home glucose  readings . fasting glucose: *** . post prandial glucose: *** -{ACTIONS;DENIES/REPORTS:21021675::"Denies"} hypoglycemic/hyperglycemic symptoms -Current meal patterns:  . breakfast: ***  . lunch: ***  . dinner: *** . snacks: *** . drinks: *** -Current exercise: *** -Educated on{CCM DM COUNSELING:25123} -Counseled to check feet daily and get yearly eye exams -{CCMPHARMDINTERVENTION:25122}  Atrial Fibrillation (Goal: prevent stroke and major bleeding) -{CHL Controlled/Uncontrolled:[phone]}  -CHADSVASC: *** -Current treatment: . Rate control: *** . Anticoagulation: *** -Medications previously tried: *** -Home BP and HR readings: ***  -Counseled on {CCMAFIBCOUNSELING:25120} -{CCMPHARMDINTERVENTION:25122}  COPD (Goal: control symptoms and prevent exacerbations) -{CHL Controlled/Uncontrolled:[phone]} -Current treatment  . *** -Medications previously tried: ***  -Gold Grade: {CHL HP Upstream Pharm COPD Gold ZOXWR:[id number]} -Current COPD Classification:  {CHL HP Upstream Pharm COPD Classification:[phone]} -MMRC/CAT score: *** -Pulmonary function testing: *** -Exacerbations requiring treatment in last 6 months: *** -Patient {Actions; denies-reports:120008} consistent use of maintenance inhaler -Frequency of rescue inhaler use: *** -Counseled on {CCMINHALERCOUNSELING:25121} -{CCMPHARMDINTERVENTION:25122}  Depression/Anxiety (Goal: ***) -{CHL Controlled/Uncontrolled:[phone]} -Current treatment: . *** -Medications previously tried/failed: *** -PHQ9: *** -GAD7: *** -Connected with *** for mental health support -Educated on {CCM mental health counseling:25127} -{CCMPHARMDINTERVENTION:25122}  Tobacco use (Goal ***) -{CHL Controlled/Uncontrolled:[phone]} -Current treatment  . *** -Patient smokes {Time to first cigarette:23873} -Patient triggers include: {Smoking Triggers:23882} -On a scale of 1-10, reports MOTIVATION to quit is *** -On a scale of 1-10,  reports CONFIDENCE in quitting is *** -Previous quit attempts: *** -{Smoking Cessation Counseling:23883} -{CCMPHARMDINTERVENTION:25122} *** (Goal: ***) -{CHL Controlled/Uncontrolled:[phone]} -Current treatment  . *** -Medications previously tried: ***  -{CCMPHARMDINTERVENTION:25122}   Patient Goals/Self-Care Activities . Over the next *** days, patient will:  - {pharmacypatientgoals:24919}  Follow Up Plan: {CM FOLLOW UP BJYN:82956}  Medication Assistance: {MEDASSISTANCEINFO:25044}  Patient's preferred pharmacy is:  [facility] [address] [place] - [place] [address] Phone: [phone] Fax: [facility] [place], [place] [address] Phone: [phone] Fax: [phone]  Uses pill box? {Yes or If no, why not?:20788} Pt endorses ***% compliance  We discussed: {Pharmacy options:24294} Patient decided to: {  [place] [facility] VXYI:01655}  Care Plan and Follow Up Patient Decision:  {FOLLOWUP:24991}  Plan: {CM FOLLOW UP PLAN:25073}  ***

## 2021-01-24 ENCOUNTER — Other Ambulatory Visit: Payer: Self-pay | Admitting: Family Medicine

## 2021-01-24 DIAGNOSIS — R519 Headache, unspecified: Secondary | ICD-10-CM

## 2021-02-27 ENCOUNTER — Other Ambulatory Visit: Payer: Self-pay | Admitting: Family Medicine

## 2021-03-18 ENCOUNTER — Telehealth: Payer: Self-pay

## 2021-03-18 NOTE — Chronic Care Management (AMB) (Addendum)
Chronic Care Management Pharmacy Assistant   Name: Amanda [name]  MRN: [id number] DOB: July 30, 1951  Reason for Encounter: Reschedule Missed CCM Appointment  Recent office visits:  None since last ccm contact  Recent consult visits:  None since last CCM contact  Hospital visits:  11/11/20 Admission from ED. Unstable angina   Medications: Outpatient Encounter Medications as of 03/18/2021  Medication Sig Note   acetaminophen (TYLENOL) 650 MG CR tablet Take 650 mg by mouth 3 times/day as needed-between meals & bedtime (for headaches).    albuterol (PROVENTIL HFA;VENTOLIN HFA) 108 (90 Base) MCG/ACT inhaler Inhale 2 puffs into the lungs every 4 (four) hours as needed for wheezing or shortness of breath.    amitriptyline (ELAVIL) 50 MG tablet TAKE 2 TABLETS BY MOUTH DAILY AT BEDTIME.    Ascorbic Acid (VITAMIN C) 1000 MG tablet Take 1,000 mg by mouth daily.    aspirin EC 81 MG tablet Take 81 mg by mouth daily.    buPROPion (WELLBUTRIN XL) 150 MG 24 hr tablet TAKE 1 TABLET (150 MG TOTAL) BY MOUTH DAILY.    calcium carbonate (TUMS - DOSED IN MG ELEMENTAL CALCIUM) 500 MG chewable tablet Chew 2 tablets by mouth daily as needed for indigestion or heartburn.    Cholecalciferol (VITAMIN D) 1000 UNITS capsule Take 1,000 Units by mouth daily.     clonazePAM (KLONOPIN) 0.5 MG tablet Take 1 tablet (0.5 mg total) by mouth 2 (two) times daily as needed.    cyclobenzaprine (FLEXERIL) 10 MG tablet TAKE 1 TABLET THREE TIMES DAILY AS NEEDED FOR MUSCLE SPAMS    ezetimibe (ZETIA) 10 MG tablet Take 1 tablet (10 mg total) by mouth daily.    FLUoxetine (PROZAC) 40 MG capsule TAKE 1 CAPSULE (40 MG TOTAL) BY MOUTH DAILY.    metoprolol tartrate (LOPRESSOR) 25 MG tablet TAKE 1/2 TABLET TWICE DAILY    Multiple Vitamin (MULTIVITAMIN) tablet Take 1 tablet by mouth daily.    naproxen sodium (ALEVE) 220 MG tablet Take 220 mg by mouth daily. 10/17/2018: Scheduled    pantoprazole (PROTONIX) 40 MG tablet TAKE 1 TABLET  30 MINUTES PRIOR TO BREAKFAST    polycarbophil (FIBERCON) 625 MG tablet Take 625 mg by mouth daily as needed. FIBER THERAPY 11/11/2020: Does not take every day, has not had in 3 days   rosuvastatin (CRESTOR) 40 MG tablet TAKE 1 TABLET EVERY DAY    Simethicone 125 MG CAPS Take 125 mg by mouth daily as needed (gas).    vitamin E 180 MG (400 UNITS) capsule Take 400 Units by mouth daily.    No facility-administered encounter medications on file as of 03/18/2021.   Contacted Amanda [name] to reschedule face to face appointment patient missed in February 2022 with Debbora Dus, Pharm D. Patient was rescheduled for 05/22/21 at 3:30 PM. Patient is aware this is a telephone appointment and will need to bring medications, supplements and any readings available to appointment.  Are you having any problems with your medications? Not at this time  What concerns would you like to discuss with the pharmacist? No concerns to discuss.    Star Rating Drugs: Rosuvastatin 40 mg  12/02/20  90 DS   Follow-Up:  Pharmacist Review and Scheduled Follow-Up With Clinical Pharmacist  Debbora Dus, CPP notified  Amanda [name], [place] Pharmacy Assistant [phone]  I have reviewed the care management and care coordination activities outlined in this encounter and I am certifying that I agree with the content of this note. No further  action required.  Debbora Dus, PharmD Clinical Pharmacist [facility] [phone]

## 2021-04-30 ENCOUNTER — Telehealth: Payer: Self-pay

## 2021-04-30 NOTE — Chronic Care Management (AMB) (Addendum)
Patient returned my call and rescheduled the CPP appointment for Amanda [name] and her husband to 05/22/2021. Debbora Dus, CPP notified  CMA Time: 12 min Avel Sensor, [place] Assistant [phone]

## 2021-05-07 ENCOUNTER — Encounter: Payer: Self-pay | Admitting: Podiatry

## 2021-05-07 ENCOUNTER — Ambulatory Visit (INDEPENDENT_AMBULATORY_CARE_PROVIDER_SITE_OTHER): Payer: Medicare Other | Admitting: Podiatry

## 2021-05-07 ENCOUNTER — Other Ambulatory Visit: Payer: Self-pay

## 2021-05-07 DIAGNOSIS — E1142 Type 2 diabetes mellitus with diabetic polyneuropathy: Secondary | ICD-10-CM

## 2021-05-07 DIAGNOSIS — B351 Tinea unguium: Secondary | ICD-10-CM | POA: Diagnosis not present

## 2021-05-07 DIAGNOSIS — M79676 Pain in unspecified toe(s): Secondary | ICD-10-CM | POA: Diagnosis not present

## 2021-05-07 DIAGNOSIS — I25118 Atherosclerotic heart disease of native coronary artery with other forms of angina pectoris: Secondary | ICD-10-CM

## 2021-05-07 MED ORDER — GABAPENTIN 100 MG PO CAPS: 100.0000 mg | ORAL_CAPSULE | Freq: Every day | ORAL | 3 refills | Status: DC

## 2021-05-07 NOTE — Progress Notes (Signed)
Subjective:  Patient ID: ILAH BOULE, female    DOB: [birth year],  MRN: [id number] HPI Chief Complaint  Patient presents with  . Diabetes    Diabetic - last a1c was 6.3, requesting nail trim, also has neuropathy, does not take any meds for it, "didn't know there was something you could take"  . New Patient (Initial Visit)    70 y.o. female presents with the above complaint.   ROS: Denies fever chills nausea vomiting muscle aches pains calf pain back pain chest pain shortness of breath.  Past Medical History:  Diagnosis Date  . 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 sting    bee stings  . Anxiety   . Aortic stenosis    a. 2013: nl LV sys fxn, mild MR, no evidence of pulm htn; b. TTE 8/17: EF 60-65%, no RWMA, nl LV dia fxn,  mild AS, mod AI  . CAD (coronary artery disease), native coronary artery 11/26/2020  . COPD (chronic obstructive pulmonary disease) ([place]) 10/30/2016  . Degenerative arthritis of knee, bilateral 06/16/2015  . Depression   . Diabetes ([place])    diet managed-type 11  . GERD (gastroesophageal reflux disease)   . Heart murmur   . Hiatal hernia with gastroesophageal reflux 1997  . Hyperlipidemia   . Hypertension   . Neuromuscular disorder ([place])    nerve damage in back and shoulder  . Osteoporosis   . Palpitations    a. 03/2012: 48-hour Holter monitor showed intermittent sinus tachycardia with occasional PACs, without other significant arrhythmia  . Thyroid disease    hyperthyroidism as a teenager - received some injections age 1 that corrected this   . Tobacco abuse    Past Surgical History:  Procedure Laterality Date  . ABDOMINAL HYSTERECTOMY  1993  . ANTERIOR CERVICAL DECOMP/DISCECTOMY FUSION N/A 10/26/2018   Procedure: ACDF C4-C5 C5-C6 C6-C7;  Surgeon: Kary Kos, MD;  Location: [facility];  Service: Neurosurgery;  Laterality: N/A;  . APPENDECTOMY    . BACK SURGERY    . BREAST BIOPSY Right 2007   benign  . CHOLECYSTECTOMY    . COLONOSCOPY     30 years ago was normal per pt.    Marland Kitchen DILATION AND CURETTAGE OF UTERUS    . ESOPHAGEAL DILATION     x 3  . fractured leg Right   . LEFT HEART CATH AND CORONARY ANGIOGRAPHY N/A 11/11/2020   Procedure: LEFT HEART CATH AND CORONARY ANGIOGRAPHY;  Surgeon: Wellington Hampshire, MD;  Location: [facility];  Service: Cardiovascular;  Laterality: N/A;  . OOPHORECTOMY    . OVARIAN CYST REMOVAL  1972  . SALIVARY GLAND SURGERY    . TONSILLECTOMY     4 yoa  . TOTAL KNEE ARTHROPLASTY Left 12/24/2015   Procedure: LEFT TOTAL KNEE ARTHROPLASTY;  Surgeon: [name], MD;  Location: [facility];  Service: Orthopedics;  Laterality: Left;  . TOTAL KNEE ARTHROPLASTY Right 12/30/2017   Procedure: RIGHT TOTAL KNEE ARTHROPLASTY;  Surgeon: [name], MD;  Location: [facility];  Service: Orthopedics;  Laterality: Right;  . WRIST FRACTURE SURGERY  11/2008    Current Outpatient Medications:  .  gabapentin (NEURONTIN) 100 MG capsule, Take 1 capsule (100 mg total) by mouth at bedtime., Disp: 30 capsule, Rfl: 3 .  acetaminophen (TYLENOL) 650 MG CR tablet, Take 650 mg by mouth 3 times/day as needed-between meals & bedtime (for headaches)., Disp: , Rfl:  .  albuterol (PROVENTIL HFA;VENTOLIN HFA) 108 (90 Base) MCG/ACT inhaler, Inhale 2 puffs into the  lungs every 4 (four) hours as needed for wheezing or shortness of breath., Disp: 1 Inhaler, Rfl: 5 .  amitriptyline (ELAVIL) 50 MG tablet, TAKE 2 TABLETS BY MOUTH DAILY AT BEDTIME., Disp: 180 tablet, Rfl: 1 .  Ascorbic Acid (VITAMIN C) 1000 MG tablet, Take 1,000 mg by mouth daily., Disp: , Rfl:  .  aspirin EC 81 MG tablet, Take 81 mg by mouth daily., Disp: , Rfl:  .  buPROPion (WELLBUTRIN XL) 150 MG 24 hr tablet, TAKE 1 TABLET (150 MG TOTAL) BY MOUTH DAILY., Disp: 90 tablet, Rfl: 1 .  calcium carbonate (TUMS - DOSED IN MG ELEMENTAL CALCIUM) 500 MG chewable tablet, Chew 2 tablets by mouth daily as needed for indigestion or heartburn., Disp: , Rfl:  .  Cholecalciferol (VITAMIN D) 1000 UNITS capsule,  Take 1,000 Units by mouth daily. , Disp: , Rfl:  .  clonazePAM (KLONOPIN) 0.5 MG tablet, Take 1 tablet (0.5 mg total) by mouth 2 (two) times daily as needed., Disp: 30 tablet, Rfl: 5 .  cyclobenzaprine (FLEXERIL) 10 MG tablet, TAKE 1 TABLET THREE TIMES DAILY AS NEEDED FOR MUSCLE SPAMS, Disp: 50 tablet, Rfl: 2 .  ezetimibe (ZETIA) 10 MG tablet, Take 1 tablet (10 mg total) by mouth daily., Disp: 90 tablet, Rfl: 3 .  FLUoxetine (PROZAC) 40 MG capsule, TAKE 1 CAPSULE (40 MG TOTAL) BY MOUTH DAILY., Disp: 90 capsule, Rfl: 1 .  metoprolol tartrate (LOPRESSOR) 25 MG tablet, TAKE 1/2 TABLET TWICE DAILY, Disp: 90 tablet, Rfl: 3 .  Multiple Vitamin (MULTIVITAMIN) tablet, Take 1 tablet by mouth daily., Disp: , Rfl:  .  naproxen sodium (ALEVE) 220 MG tablet, Take 220 mg by mouth daily., Disp: , Rfl:  .  pantoprazole (PROTONIX) 40 MG tablet, TAKE 1 TABLET 30 MINUTES PRIOR TO BREAKFAST, Disp: 90 tablet, Rfl: 3 .  polycarbophil (FIBERCON) 625 MG tablet, Take 625 mg by mouth daily as needed. FIBER THERAPY, Disp: , Rfl:  .  rosuvastatin (CRESTOR) 40 MG tablet, TAKE 1 TABLET EVERY DAY, Disp: 90 tablet, Rfl: 1 .  Simethicone 125 MG CAPS, Take 125 mg by mouth daily as needed (gas)., Disp: , Rfl:  .  vitamin E 180 MG (400 UNITS) capsule, Take 400 Units by mouth daily., Disp: , Rfl:   Allergies  Allergen Reactions  . Honey Bee Venom [Bee Venom] Anaphylaxis  . Diazepam Other (See Comments)    REACTION: pt states she gets very angry and abusive verbally on this medication    Review of Systems Objective:  There were no vitals filed for this visit.  General: Well developed, nourished, in no acute distress, alert and oriented x3   Dermatological: Skin is warm, dry and supple bilateral. Nails x 10 are well maintained; remaining integument appears unremarkable at this time. There are no open sores, no preulcerative lesions, no rash or signs of infection present.  Vascular: Dorsalis Pedis artery and Posterior Tibial  artery pedal pulses are 2/4 bilateral with immedate capillary fill time. Pedal hair growth present. No varicosities and no lower extremity edema present bilateral.   Neruologic: Grossly intact via light touch bilateral. Vibratory intact via tuning fork bilateral. Protective threshold with Semmes Wienstein monofilament diminished to all pedal sites bilateral. Patellar and Achilles deep tendon reflexes 2+ bilateral. No Babinski or clonus noted bilateral.   Musculoskeletal: No gross boney pedal deformities bilateral. No pain, crepitus, or limitation noted with foot and ankle range of motion bilateral. Muscular strength 5/5 in all groups tested bilateral.  Gait: Unassisted, Nonantalgic.  Radiographs:  None taken  Assessment & Plan:   Assessment: Diabetic peripheral neuropathy pain in limb secondary to onychomycosis  Plan: Started her on gabapentin 100 mg 1 p.o. nightly and debrided her toenails 1 through 5 bilaterally.  Discussed pros and cons of the use of the gabapentin and the possible side effects.  Follow-up with her in 1 month for med check.     Azaiah Licciardi T. [place], [place]

## 2021-05-20 ENCOUNTER — Other Ambulatory Visit: Payer: Self-pay

## 2021-05-20 ENCOUNTER — Ambulatory Visit (INDEPENDENT_AMBULATORY_CARE_PROVIDER_SITE_OTHER): Payer: Medicare Other | Admitting: Cardiovascular Disease

## 2021-05-20 ENCOUNTER — Encounter: Payer: Self-pay | Admitting: Cardiovascular Disease

## 2021-05-20 VITALS — BP 140/68 | HR 75 | Ht 68.0 in | Wt 214.1 lb

## 2021-05-20 DIAGNOSIS — I350 Nonrheumatic aortic (valve) stenosis: Secondary | ICD-10-CM | POA: Diagnosis not present

## 2021-05-20 DIAGNOSIS — R0989 Other specified symptoms and signs involving the circulatory and respiratory systems: Secondary | ICD-10-CM | POA: Diagnosis not present

## 2021-05-20 DIAGNOSIS — E785 Hyperlipidemia, unspecified: Secondary | ICD-10-CM

## 2021-05-20 DIAGNOSIS — Z720 Tobacco use: Secondary | ICD-10-CM

## 2021-05-20 DIAGNOSIS — R002 Palpitations: Secondary | ICD-10-CM

## 2021-05-20 DIAGNOSIS — I25118 Atherosclerotic heart disease of native coronary artery with other forms of angina pectoris: Secondary | ICD-10-CM | POA: Diagnosis not present

## 2021-05-20 NOTE — Patient Instructions (Signed)
   Medication Instructions:   Your physician recommends that you continue on your current medications as directed. Please refer to the Current Medication list given to you today.  *If you need a refill on your cardiac medications before your next appointment, please call your pharmacy*   Lab Work: None ordered If you have labs (blood work) drawn today and your tests are completely normal, you will receive your results only by: [place] (if you have MyChart) OR A paper copy in the mail If you have any lab test that is abnormal or we need to change your treatment, we will call you to review the results.   Testing/Procedures:   Your physician has requested that you have a carotid duplex. This test is an ultrasound of the carotid arteries in your neck. It looks at blood flow through these arteries that supply the brain with blood. Allow one hour for this exam. There are no restrictions or special instructions.   2.   Your physician has requested that you have an echocardiogram in 6 months. Echocardiography is a painless test that uses sound waves to create images of your heart. It provides your doctor with information about the size and shape of your heart and how well your heart's chambers and valves are working. This procedure takes approximately one hour. There are no restrictions for this procedure.    Follow-Up: At [facility], you and your health needs are our priority.  As part of our continuing mission to provide you with exceptional heart care, we have created designated Provider Care Teams.  These Care Teams include your primary Cardiologist (physician) and Advanced Practice Providers (APPs -  Physician Assistants and Nurse Practitioners) who all work together to provide you with the care you need, when you need it.  We recommend signing up for the patient portal called "MyChart".  Sign up information is provided on this After Visit Summary.  MyChart is used to connect with  patients for Virtual Visits (Telemedicine).  Patients are able to view lab/test results, encounter notes, upcoming appointments, etc.  Non-urgent messages can be sent to your provider as well.   To learn more about what you can do with MyChart, go to [email or web address].    Your next appointment:   6 month(s)  The format for your next appointment:   In Person  Provider:   You may see Dr. Fletcher Anon or one of the following Advanced Practice Providers on your designated Care Team:   Murray Hodgkins, NP Christell Faith, PA-C Marrianne Mood, PA-C Cadence Wyocena, [place] Laurann Montana, NP   Other Instructions

## 2021-05-20 NOTE — Progress Notes (Signed)
Cardiology Office Note   Date:  05/20/2021   ID:  Amanda [name], DOB Apr 16, 1951, MRN [id number]  PCP:  Owens Loffler, MD  Cardiologist:   Kathlyn Sacramento, MD   Chief Complaint  Patient presents with   Other    6  Month f/u c/o chest pain discomfort last night, fatigue and sob. Meds reviewed verbally with pt.      History of Present Illness: Amanda [name] is a 70 y.o. female who presents for a follow-up regarding moderate aortic stenosis and nonobstructive coronary artery disease.  She has other chronic medical conditions that include COPD, tobacco use, diet-controlled diabetes, hypertension and hyperlipidemia. CTA of the coronary arteries in September 2019 showed a calcium score of 521 with moderate nonobstructive disease affecting the RCA and LAD.  These were not significant by FFR. Echocardiogram in September 2020 showed an EF of 60 to 65% with moderate aortic stenosis.  Mean gradient was 19 mmHg with valve area of 1.28. She had a repeat echocardiogram recently which showed an EF of 60 to 95%, grade 1 diastolic dysfunction, moderate aortic stenosis with a mean gradient of 22 mmHg and valve area of 1.29.  She was hospitalized in December 2021 with chest pain and worsening exertional dyspnea. I proceeded with a right and left cardiac catheterization which showed mild nonobstructive coronary artery disease, right heart catheterization showed normal filling pressures, normal pulmonary pressure and normal cardiac output.  Aortic stenosis was moderate with a mean gradient of 24 mmHg and valve area of 1.44 cm.  She has been doing reasonably well with no worsening dyspnea.  She reports intermittent episodes of chest pain that wakes her up from sleep.  The pain usually responds to 1 sublingual nitroglycerin.  She does have GERD and takes pantoprazole on a regular basis.  She has no exertional chest pain.   Past Medical History:  Diagnosis Date   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 sting    bee  stings   Anxiety    Aortic stenosis    a. 2013: nl LV sys fxn, mild MR, no evidence of pulm htn; b. TTE 8/17: EF 60-65%, no RWMA, nl LV dia fxn,  mild AS, mod AI   CAD (coronary artery disease), native coronary artery 11/26/2020   COPD (chronic obstructive pulmonary disease) ([place]) 10/30/2016   Degenerative arthritis of knee, bilateral 06/16/2015   Depression    Diabetes ([facility])    diet managed-type 11   GERD (gastroesophageal reflux disease)    Heart murmur    Hiatal hernia with gastroesophageal reflux 1997   Hyperlipidemia    Hypertension    Neuromuscular disorder ([place])    nerve damage in back and shoulder   Osteoporosis    Palpitations    a. 03/2012: 48-hour Holter monitor showed intermittent sinus tachycardia with occasional PACs, without other significant arrhythmia   Thyroid disease    hyperthyroidism as a teenager - received some injections age 69 that corrected this    Tobacco abuse     Past Surgical History:  Procedure Laterality Date   [place] DECOMP/DISCECTOMY FUSION N/A 10/26/2018   Procedure: ACDF C4-C5 C5-C6 C6-C7;  Surgeon: Kary Kos, MD;  Location: [facility];  Service: Neurosurgery;  Laterality: N/A;   APPENDECTOMY     BACK SURGERY     BREAST BIOPSY Right 2007   benign   CHOLECYSTECTOMY     COLONOSCOPY     30 years ago was normal per pt.  DILATION AND CURETTAGE OF UTERUS     ESOPHAGEAL DILATION     x 3   fractured leg Right    LEFT HEART CATH AND CORONARY ANGIOGRAPHY N/A 11/11/2020   Procedure: LEFT HEART CATH AND CORONARY ANGIOGRAPHY;  Surgeon: Wellington Hampshire, MD;  Location: [facility];  Service: Cardiovascular;  Laterality: N/A;   OOPHORECTOMY     OVARIAN CYST REMOVAL  1972   SALIVARY GLAND SURGERY     TONSILLECTOMY     4 yoa   TOTAL KNEE ARTHROPLASTY Left 12/24/2015   Procedure: LEFT TOTAL KNEE ARTHROPLASTY;  Surgeon: [name], MD;  Location: [facility];  Service: Orthopedics;  Laterality: Left;    TOTAL KNEE ARTHROPLASTY Right 12/30/2017   Procedure: RIGHT TOTAL KNEE ARTHROPLASTY;  Surgeon: [name], MD;  Location: [facility];  Service: Orthopedics;  Laterality: Right;   WRIST FRACTURE SURGERY  11/2008     Current Outpatient Medications  Medication Sig Dispense Refill   acetaminophen (TYLENOL) 650 MG CR tablet Take 650 mg by mouth 3 times/day as needed-between meals & bedtime (for headaches).     albuterol (PROVENTIL HFA;VENTOLIN HFA) 108 (90 Base) MCG/ACT inhaler Inhale 2 puffs into the lungs every 4 (four) hours as needed for wheezing or shortness of breath. 1 Inhaler 5   amitriptyline (ELAVIL) 50 MG tablet TAKE 2 TABLETS BY MOUTH DAILY AT BEDTIME. 180 tablet 1   Ascorbic Acid (VITAMIN C) 1000 MG tablet Take 1,000 mg by mouth daily.     aspirin EC 81 MG tablet Take 81 mg by mouth daily.     buPROPion (WELLBUTRIN XL) 150 MG 24 hr tablet TAKE 1 TABLET (150 MG TOTAL) BY MOUTH DAILY. 90 tablet 1   calcium carbonate (TUMS - DOSED IN MG ELEMENTAL CALCIUM) 500 MG chewable tablet Chew 2 tablets by mouth daily as needed for indigestion or heartburn.     Cholecalciferol (VITAMIN D) 1000 UNITS capsule Take 1,000 Units by mouth daily.      clonazePAM (KLONOPIN) 0.5 MG tablet Take 1 tablet (0.5 mg total) by mouth 2 (two) times daily as needed. 30 tablet 5   cyclobenzaprine (FLEXERIL) 10 MG tablet TAKE 1 TABLET THREE TIMES DAILY AS NEEDED FOR MUSCLE SPAMS 50 tablet 2   ezetimibe (ZETIA) 10 MG tablet Take 1 tablet (10 mg total) by mouth daily. 90 tablet 3   FLUoxetine (PROZAC) 40 MG capsule TAKE 1 CAPSULE (40 MG TOTAL) BY MOUTH DAILY. 90 capsule 1   gabapentin (NEURONTIN) 100 MG capsule Take 1 capsule (100 mg total) by mouth at bedtime. 30 capsule 3   metoprolol tartrate (LOPRESSOR) 25 MG tablet TAKE 1/2 TABLET TWICE DAILY 90 tablet 3   Multiple Vitamin (MULTIVITAMIN) tablet Take 1 tablet by mouth daily.     naproxen sodium (ALEVE) 220 MG tablet Take 220 mg by mouth daily.     pantoprazole  (PROTONIX) 40 MG tablet TAKE 1 TABLET 30 MINUTES PRIOR TO BREAKFAST 90 tablet 3   polycarbophil (FIBERCON) 625 MG tablet Take 625 mg by mouth daily as needed. FIBER THERAPY     rosuvastatin (CRESTOR) 40 MG tablet TAKE 1 TABLET EVERY DAY 90 tablet 1   Simethicone 125 MG CAPS Take 125 mg by mouth daily as needed (gas).     vitamin E 180 MG (400 UNITS) capsule Take 400 Units by mouth daily.     No current facility-administered medications for this visit.    Allergies:   Honey bee venom [bee venom] and Diazepam  Social History:  The patient  reports that she has been smoking cigarettes. She has a 33.75 pack-year smoking history. She has never used smokeless tobacco. She reports current alcohol use. She reports that she does not use drugs.   Family History:  The patient's family history includes Arthritis in her mother; Breast cancer in her sister; Cancer in her paternal grandmother; Colon polyps in her mother and sister; Diabetes in her mother; Heart disease in her mother; Hypercholesterolemia in her sister; Hyperlipidemia in her mother.    ROS:  Please see the history of present illness.   Otherwise, review of systems are positive for none.   All other systems are reviewed and negative.    PHYSICAL EXAM: VS:  BP 140/68 (BP Location: Left Arm, Patient Position: Sitting, Cuff Size: Normal)   Pulse 75   Ht 5\' 8"  (1.727 m)   Wt 214 lb 2 oz (97.1 kg)   SpO2 98%   BMI 32.56 kg/m  , BMI Body mass index is 32.56 kg/m. GEN: Well nourished, well developed, in no acute distress  HEENT: normal  Neck: no JVD, or masses.  Bilateral carotid bruits Cardiac: RRR; no  rubs, or gallops,no edema . 2/6 crescendo decrescendo systolic murmur in the aortic area which is  mid peaking. Respiratory:  clear to auscultation bilaterally, normal work of breathing GI: soft, nontender, nondistended, + BS MS: no deformity or atrophy  Skin: warm and dry, no rash Neuro:  Strength and sensation are intact Psych:  euthymic mood, full affect   EKG:  EKG is not ordered today. Recent EKG was reviewed which showed sinus rhythm with  right bundle branch block.   Recent Labs: 08/08/2020: ALT 12 11/25/2020: BUN 12; Creatinine, Ser 0.85; Hemoglobin 12.4; Platelets 240.0; Potassium 4.2; Sodium 139    Lipid Panel    Component Value Date/Time   CHOL 156 08/08/2020 1021   CHOL 201 (H) 09/18/2013 1016   TRIG 104.0 08/08/2020 1021   HDL 49.50 08/08/2020 1021   HDL 53 09/18/2013 1016   CHOLHDL 3 08/08/2020 1021   VLDL 20.8 08/08/2020 1021   LDLCALC 85 08/08/2020 1021   LDLCALC 130 (H) 09/18/2013 1016   LDLDIRECT 209.2 05/28/2009 0909      Wt Readings from Last 3 Encounters:  05/20/21 214 lb 2 oz (97.1 kg)  12/13/20 221 lb (100.2 kg)  11/25/20 221 lb 12 oz (100.6 kg)         ASSESSMENT AND PLAN:   1.  Mild nonobstructive coronary artery disease: Cardiac catheterization in December of last year showed mild nonobstructive disease.  Recommend continuing medical therapy and low-dose aspirin.  Intermittent episodes of chest pain at night that responded to nitroglycerin is highly suggestive of esophageal spasm.    2.  Aortic stenosis: This continues to be moderate with minimal progression in mean gradient.  Repeat echocardiogram in 6 months  3.  Palpitations: Previous mild PACs. Symptoms are well-controlled on small dose metoprolol.  4 . Tobacco use: I discussed the importance of smoking cessation.  5.  Hyperlipidemia: Continue high-dose rosuvastatin and Zetia with a target LDL of less than 70.  6.  Bilateral carotid bruits: I requested carotid Doppler.   Disposition:   FU with me in 6 months  Signed,  Kathlyn Sacramento, MD  05/20/2021 2:00 PM    [place]

## 2021-05-22 ENCOUNTER — Telehealth: Payer: Medicare Other

## 2021-05-27 ENCOUNTER — Other Ambulatory Visit: Payer: Self-pay | Admitting: Family Medicine

## 2021-05-27 NOTE — Telephone Encounter (Signed)
Last office visit 11/25/2020 for follow up cardiology visit.  Last refilled 10/21/2020 for #30 with 5 refills.  No future appointments.

## 2021-05-28 ENCOUNTER — Ambulatory Visit: Payer: Medicare Other | Admitting: Family Medicine

## 2021-06-02 ENCOUNTER — Ambulatory Visit: Payer: Medicare Other | Admitting: Family Medicine

## 2021-06-15 ENCOUNTER — Encounter: Payer: Self-pay | Admitting: Family Medicine

## 2021-06-15 NOTE — Progress Notes (Deleted)
    Derian Pfost T. Hazelene Doten, MD, [place] at [facility] [place], [address]  Phone: [phone]  FAX: [phone]  Amanda [name] - 70 y.o. female  MRN [id number]  Date of Birth: May 24, 1951  Date: 06/16/2021  PCP: Owens Loffler, MD  Referral: Owens Loffler, MD  No chief complaint on file.   This visit occurred during the SARS-CoV-2 public health emergency.  Safety protocols were in place, including screening questions prior to the visit, additional usage of staff PPE, and extensive cleaning of exam room while observing appropriate contact time as indicated for disinfecting solutions.   Subjective:   Amanda [name] is a 70 y.o. very pleasant female patient who presents with the following:  Well-known patient, she is here today in follow-up of multiple medical problems.  Most significant, she was admitted to the hospital with unstable angina in December 2021.  Dr. Velva Harman did a left and right heart cath which showed some mild nonobstructive coronary disease.  There was also visualized moderate aortic stenosis.  Since then, she has had some occasional chest pain, but this has been relieved with nitroglycerin in the past.  Chronic COPD with continued smoking.  This is been a challenge, and she currently is taking some albuterol as needed.  Diabetes has been relatively well controlled.  She does have some neuropathy and she is taking some gabapentin as well as some long-term amitriptyline.  HTN: Tolerating all medications without side effects Stable and at goal No CP, no sob. No HA.  BP Readings from Last 3 Encounters:  05/20/21 140/68  12/13/20 122/64  11/25/20 449/67    Basic Metabolic Panel:    Component Value Date/Time   NA 139 11/25/2020 1527   K 4.2 11/25/2020 1527   CL 105 11/25/2020 1527   CO2 30 11/25/2020 1527   BUN 12 11/25/2020 1527   CREATININE 0.85 11/25/2020 1527   GLUCOSE 90 11/25/2020 1527    CALCIUM 9.9 11/25/2020 1527    Lipids: Doing well, stable. Tolerating meds fine with no SE. Panel reviewed with patient.  Lipids: Lab Results  Component Value Date   CHOL 156 08/08/2020   Lab Results  Component Value Date   HDL 49.50 08/08/2020   Lab Results  Component Value Date   LDLCALC 85 08/08/2020   Lab Results  Component Value Date   TRIG 104.0 08/08/2020   Lab Results  Component Value Date   CHOLHDL 3 08/08/2020    Lab Results  Component Value Date   ALT 12 08/08/2020   AST 15 08/08/2020   ALKPHOS 48 08/08/2020   BILITOT 0.5 08/08/2020    She has also had some intermittent severe depression.  She is currently taking Prozac 40 mg, Klonopin 0.5 mg p.o. twice daily as well as Wellbutrin XL 150 mg.  Review of Systems is noted in the HPI, as appropriate  Objective:   There were no vitals taken for this visit.  GEN: WDWN, NAD, Non-toxic HEENT: Atraumatic, Normocephalic. Neck supple. No masses. CV: RRR, No M/G/R. No JVD. No thrill. No extra heart sounds. PULM: CTA B, no wheezes, crackles, rhonchi. No retractions. No resp. distress. No accessory muscle use. EXTR: No c/c/e NEURO Normal gait.  PSYCH: Normally interactive. Conversant.   Laboratory and Imaging Data:  Assessment and Plan:   ***

## 2021-06-16 ENCOUNTER — Ambulatory Visit: Payer: Medicare Other | Admitting: Family Medicine

## 2021-06-23 ENCOUNTER — Ambulatory Visit
Admission: RE | Admit: 2021-06-23 | Discharge: 2021-06-23 | Disposition: A | Discharge status: Home / Self Care | Payer: Medicare Other | Source: Ambulatory Visit | Attending: Family Medicine | Admitting: Family Medicine

## 2021-06-23 ENCOUNTER — Encounter: Payer: Self-pay | Admitting: Family Medicine

## 2021-06-23 ENCOUNTER — Other Ambulatory Visit: Payer: Self-pay

## 2021-06-23 ENCOUNTER — Ambulatory Visit (INDEPENDENT_AMBULATORY_CARE_PROVIDER_SITE_OTHER): Payer: Medicare Other | Admitting: Family Medicine

## 2021-06-23 ENCOUNTER — Ambulatory Visit
Admission: RE | Admit: 2021-06-23 | Discharge: 2021-06-23 | Disposition: A | Discharge status: Home / Self Care | Payer: Medicare Other | Attending: Family Medicine | Admitting: Family Medicine

## 2021-06-23 VITALS — BP 120/60 | HR 87 | Temp 98.3°F | Ht 65.5 in | Wt 213.5 lb

## 2021-06-23 DIAGNOSIS — M25552 Pain in left hip: Secondary | ICD-10-CM

## 2021-06-23 DIAGNOSIS — J449 Chronic obstructive pulmonary disease, unspecified: Secondary | ICD-10-CM

## 2021-06-23 DIAGNOSIS — M549 Dorsalgia, unspecified: Secondary | ICD-10-CM | POA: Insufficient documentation

## 2021-06-23 DIAGNOSIS — G8929 Other chronic pain: Secondary | ICD-10-CM | POA: Diagnosis not present

## 2021-06-23 DIAGNOSIS — Z79899 Other long term (current) drug therapy: Secondary | ICD-10-CM

## 2021-06-23 DIAGNOSIS — M79605 Pain in left leg: Secondary | ICD-10-CM | POA: Insufficient documentation

## 2021-06-23 DIAGNOSIS — I2510 Atherosclerotic heart disease of native coronary artery without angina pectoris: Secondary | ICD-10-CM

## 2021-06-23 DIAGNOSIS — E7800 Pure hypercholesterolemia, unspecified: Secondary | ICD-10-CM

## 2021-06-23 DIAGNOSIS — F17200 Nicotine dependence, unspecified, uncomplicated: Secondary | ICD-10-CM

## 2021-06-23 DIAGNOSIS — M545 Low back pain, unspecified: Secondary | ICD-10-CM | POA: Insufficient documentation

## 2021-06-23 DIAGNOSIS — I10 Essential (primary) hypertension: Secondary | ICD-10-CM | POA: Diagnosis not present

## 2021-06-23 DIAGNOSIS — E1143 Type 2 diabetes mellitus with diabetic autonomic (poly)neuropathy: Secondary | ICD-10-CM

## 2021-06-23 LAB — CBC WITH DIFFERENTIAL/PLATELET
Basophils Absolute: 0.1 10*3/uL (ref 0.0–0.1)
Basophils Relative: 0.9 % (ref 0.0–3.0)
Eosinophils Absolute: 0.2 10*3/uL (ref 0.0–0.7)
Eosinophils Relative: 3.7 % (ref 0.0–5.0)
HCT: 39.6 % (ref 36.0–46.0)
Hemoglobin: 13.3 g/dL (ref 12.0–15.0)
Lymphocytes Relative: 28.7 % (ref 12.0–46.0)
Lymphs Abs: 1.7 10*3/uL (ref 0.7–4.0)
MCHC: 33.4 g/dL (ref 30.0–36.0)
MCV: 101 fl — ABNORMAL HIGH (ref 78.0–100.0)
Monocytes Absolute: 0.5 10*3/uL (ref 0.1–1.0)
Monocytes Relative: 8.5 % (ref 3.0–12.0)
Neutro Abs: 3.4 10*3/uL (ref 1.4–7.7)
Neutrophils Relative %: 58.2 % (ref 43.0–77.0)
Platelets: 188 10*3/uL (ref 150.0–400.0)
RBC: 3.92 Mil/uL (ref 3.87–5.11)
RDW: 12.1 % (ref 11.5–15.5)
WBC: 5.8 10*3/uL (ref 4.0–10.5)

## 2021-06-23 LAB — HEPATIC FUNCTION PANEL
ALT: 19 U/L (ref 0–35)
AST: 22 U/L (ref 0–37)
Albumin: 4.2 g/dL (ref 3.5–5.2)
Alkaline Phosphatase: 35 U/L — ABNORMAL LOW (ref 39–117)
Bilirubin, Direct: 0.1 mg/dL (ref 0.0–0.3)
Total Bilirubin: 0.6 mg/dL (ref 0.2–1.2)
Total Protein: 6.4 g/dL (ref 6.0–8.3)

## 2021-06-23 LAB — LIPID PANEL
Cholesterol: 129 mg/dL (ref 0–200)
HDL: 54.5 mg/dL (ref >39.00)
LDL Cholesterol: 60 mg/dL (ref 0–99)
NonHDL: 74.71
Total CHOL/HDL Ratio: 2
Triglycerides: 74 mg/dL (ref 0.0–149.0)
VLDL: 14.8 mg/dL (ref 0.0–40.0)

## 2021-06-23 LAB — BASIC METABOLIC PANEL
BUN: 14 mg/dL (ref 6–23)
CO2: 28 mEq/L (ref 19–32)
Calcium: 9.4 mg/dL (ref 8.4–10.5)
Chloride: 104 mEq/L (ref 96–112)
Creatinine, Ser: 0.84 mg/dL (ref 0.40–1.20)
GFR: 70.07 mL/min (ref >60.00)
Glucose, Bld: 120 mg/dL — ABNORMAL HIGH (ref 70–99)
Potassium: 4.4 mEq/L (ref 3.5–5.1)
Sodium: 138 mEq/L (ref 135–145)

## 2021-06-23 LAB — MICROALBUMIN / CREATININE URINE RATIO
Creatinine,U: 90.3 mg/dL
Microalb Creat Ratio: 3.8 mg/g (ref 0.0–30.0)
Microalb, Ur: 3.4 mg/dL — ABNORMAL HIGH (ref 0.0–1.9)

## 2021-06-23 LAB — HEMOGLOBIN A1C: Hgb A1c MFr Bld: 6.5 % (ref 4.6–6.5)

## 2021-06-23 NOTE — Progress Notes (Addendum)
Amanda [name] T. Baer Hinton, MD, [place] at [facility] [place], [address]  Phone: [phone]  FAX: [phone]  [name] - 70 y.o. female  MRN [id number]  Date of Birth: [birth year]  Date: 06/23/2021  PCP: Owens Loffler, MD  Referral: Owens Loffler, MD  Chief Complaint  Patient presents with   Fall    03-17-21 at [facility] on [employer] One week later fell at home   Hip Pain    Left-Starts in lower back and radiates down leg   Back Pain   Varicose Veins    Right Lower Leg-Started bleeding last Thursday    This visit occurred during the SARS-CoV-2 public health emergency.  Safety protocols were in place, including screening questions prior to the visit, additional usage of staff PPE, and extensive cleaning of exam room while observing appropriate contact time as indicated for disinfecting solutions.   Subjective:   Amanda [name] is a 70 y.o. very pleasant female patient with Body mass index is 34.99 kg/m. who presents with the following:  She presents with a number of different ongoing medical as well as orthopedic issues.  Fall, and she subsequently has developed some acute back pain, pain in her buttocks, and radicular pain on the left side going all the way down her entire leg.  She does not have any focal weakness or numbness.  Chronic back pain.  She also has a history of some chronic back pain going many years.  This does appear to be flared up and worse after her recent falls.  Bleeding varicose vein, this is completely resolved.  Cardiac / aortic stenosis, sees dr. Fletcher Anon Carotid ulcer and echo is upcoming.   Smoking / COPD: 1/2 ppd She continues to have issues particularly when the air is hot and humid, but she rarely uses her inhaler and she continues to smoke.  Feels some pressure - body and family Daughter weight 390 lbs, and she does not really contribute very much Her husband  is feeble at this point and not really able to help much at home.  Primary issue: Has days where she hurts everywhere all the time. Neck surgery will hurt a lot.  This is all in the neck region.  Right now has diffuse LBP and L radicular symptoms.   Has pain in the R and L hips.   Since she fell Anterior groin pain as well She is able to ambulate without much difficulty.  Diabetes Mellitus: Tolerating Medications: yes Compliance with diet: fair, Body mass index is 34.99 kg/m. Exercise: minimal / intermittent Avg blood sugars at home: not checking Foot problems: none Hypoglycemia: none No nausea, vomitting, blurred vision, polyuria.  Lab Results  Component Value Date   HGBA1C 6.5 06/23/2021   HGBA1C 6.3 08/08/2020   HGBA1C 6.5 05/31/2019   Lab Results  Component Value Date   MICROALBUR 3.4 (H) 06/23/2021   LDLCALC 60 06/23/2021   CREATININE 0.84 06/23/2021    Wt Readings from Last 3 Encounters:  06/23/21 213 lb 8 oz (96.8 kg)  05/20/21 214 lb 2 oz (97.1 kg)  12/13/20 221 lb (100.2 kg)     DM labs [facility] She also had some chest pain which probably resolved that she associates with eating. Review of Systems is noted in the HPI, as appropriate  Objective:   BP 120/60   Pulse 87   Temp 98.3 F (36.8 C) (Temporal)   Ht 5' 5.5" (1.664 m)  Wt 213 lb 8 oz (96.8 kg)   SpO2 96%   BMI 34.99 kg/m   GEN: No acute distress; alert,appropriate. PULM: Breathing comfortably in no respiratory distress PSYCH: Normally interactive.  CV: RRR, no m/g/r  PULM: Normal respiratory rate, no accessory muscle use. No wheezes, crackles or rhonchi    Range of motion at  the waist: Flexion: normal Extension: normal Lateral bending: normal Rotation: all normal  No echymosis or edema Rises to examination table with no difficulty Gait: non antalgic  Inspection/Deformity: N Paraspinus Tenderness: L3-S1 bilaterally  B Ankle Dorsiflexion (L5,4): 5/5 B Great Toe Dorsiflexion  (L5,4): 5/5 Heel Walk (L5): WNL Toe Walk (S1): WNL Rise/Squat (L4): WNL  SENSORY B Medial Foot (L4): WNL B Dorsum (L5): WNL B Lateral (S1): WNL Light Touch: WNL  B SLR, seated: neg B Greater Troch: NT B Log Roll: neg B Sciatic Notch: NT   Laboratory and Imaging Data:  Assessment and Plan:     ICD-10-CM   1. Controlled type 2 diabetes mellitus with diabetic autonomic neuropathy, without long-term current use of insulin (HCC)  E11.43 Hemoglobin A1c    Microalbumin / creatinine urine ratio    2. Pure hypercholesterolemia  E78.00 Lipid panel    3. Essential hypertension  I10     4. Smoker  F17.200     5. COPD with chronic bronchitis ([place])  J44.9     6. Encounter for long-term (current) use of medications  S28.315 Basic metabolic panel    CBC with Differential/Platelet    Hepatic function panel    7. Chronic back pain greater than 3 months duration  M54.9 DG Lumbar Spine Complete   G89.29     8. Chronic hip pain, left  M25.552 DG HIP UNILAT WITH PELVIS 2-3 VIEWS LEFT   G89.29     9. Lumbar pain with radiation down left leg  M54.50 DG Lumbar Spine Complete   M79.605 DG HIP UNILAT WITH PELVIS 2-3 VIEWS LEFT     Follow-up laboratories, diabetes, hyperlipidemia.  Status post fall with anterior groin pain as well as acute on chronic back pain with exacerbation and radicular symptoms.  For now, obtain lumbar spine films as well as left-sided 3 view pelvis.  If all grossly normal with arthritic changes only, burst of oral pred.   Smoking/COPD, continue to work on quitting and use albuterol.  With her ongoing musculoskeletal issues post fall, I am can have her follow-up with me in 6 weeks.  Addendum: 06/25/21 5:04 PM  With negative / degenerative changes only, I am going to send her in some steroids and hopefully help with the radiculopathy.  A1c is 6.5.  DG Lumbar Spine Complete  Result Date: 06/24/2021 CLINICAL DATA:  LEFT hip, buttock and low back pain since a fall 3  months ago, lumbar radiculopathy, history osteoporosis EXAM: LUMBAR SPINE - COMPLETE 4+ VIEW COMPARISON:  03/25/2016 FINDINGS: Osseous demineralization. Five non-rib-bearing lumbar vertebra. Multilevel disc space narrowing and endplate spur formation. Multilevel facet degenerative changes throughout lumbar spine. Vertebral body heights maintained without fracture or subluxation. SI joints preserved. No gross evidence of spondylolysis. Atherosclerotic calcifications aorta. IMPRESSION: Degenerative disc and facet disease changes lumbar spine. No acute abnormalities. Aortic Atherosclerosis (ICD10-I70.0). Electronically Signed   By: Lavonia Dana M.D.   On: 06/24/2021 09:39   DG HIP UNILAT WITH PELVIS 2-3 VIEWS LEFT  Result Date: 06/24/2021 CLINICAL DATA:  Acute on chronic LEFT hip pain post fall, fell 3 months ago, LEFT hip, buttock, and low back  pain since EXAM: DG HIP (WITH OR WITHOUT PELVIS) 2-3V LEFT COMPARISON:  12/21/2019 FINDINGS: Osseous demineralization. Sclerosis at pubic symphysis progressive since previous exam. Mild BILATERAL hip joint space narrowing. SI joints preserved. No acute fracture, dislocation, or bone destruction. Degenerative disc and facet disease changes at visualized lower lumbar spine. IMPRESSION: Mild degenerative changes of the hip joints and pubic symphysis. Degenerative disc and facet disease changes at visualized lower lumbar spine. No acute osseous abnormalities. Electronically Signed   By: Lavonia Dana M.D.   On: 06/24/2021 09:38     Orders Placed This Encounter  Procedures   DG Lumbar Spine Complete   DG HIP UNILAT WITH PELVIS 2-3 VIEWS LEFT   Basic metabolic panel   CBC with Differential/Platelet   Hepatic function panel   Hemoglobin A1c   Microalbumin / creatinine urine ratio   Lipid panel    Signed,  Ashtan Girtman T. Raymel Cull, MD   Outpatient Encounter Medications as of 06/23/2021  Medication Sig   acetaminophen (TYLENOL) 650 MG CR tablet Take 650 mg by mouth 3  times/day as needed-between meals & bedtime (for headaches).   albuterol (PROVENTIL HFA;VENTOLIN HFA) 108 (90 Base) MCG/ACT inhaler Inhale 2 puffs into the lungs every 4 (four) hours as needed for wheezing or shortness of breath.   amitriptyline (ELAVIL) 50 MG tablet TAKE 2 TABLETS BY MOUTH DAILY AT BEDTIME.   Ascorbic Acid (VITAMIN C) 1000 MG tablet Take 1,000 mg by mouth daily.   aspirin EC 81 MG tablet Take 81 mg by mouth daily.   buPROPion (WELLBUTRIN XL) 150 MG 24 hr tablet TAKE 1 TABLET (150 MG TOTAL) BY MOUTH DAILY.   calcium carbonate (TUMS - DOSED IN MG ELEMENTAL CALCIUM) 500 MG chewable tablet Chew 2 tablets by mouth daily as needed for indigestion or heartburn.   Cholecalciferol (VITAMIN D) 1000 UNITS capsule Take 1,000 Units by mouth daily.    clonazePAM (KLONOPIN) 0.5 MG tablet Take 1 tablet by mouth twice daily as needed   cyclobenzaprine (FLEXERIL) 10 MG tablet TAKE 1 TABLET THREE TIMES DAILY AS NEEDED FOR MUSCLE SPAMS   FLUoxetine (PROZAC) 40 MG capsule TAKE 1 CAPSULE (40 MG TOTAL) BY MOUTH DAILY.   gabapentin (NEURONTIN) 100 MG capsule Take 1 capsule (100 mg total) by mouth at bedtime.   metoprolol tartrate (LOPRESSOR) 25 MG tablet TAKE 1/2 TABLET TWICE DAILY   Multiple Vitamin (MULTIVITAMIN) tablet Take 1 tablet by mouth daily.   naproxen sodium (ALEVE) 220 MG tablet Take 220 mg by mouth daily.   pantoprazole (PROTONIX) 40 MG tablet TAKE 1 TABLET 30 MINUTES PRIOR TO BREAKFAST   polycarbophil (FIBERCON) 625 MG tablet Take 625 mg by mouth daily as needed. FIBER THERAPY   predniSONE (DELTASONE) 20 MG tablet 2 tabs po daily for 5 days, then 1 tab po daily for 5 days   rosuvastatin (CRESTOR) 40 MG tablet TAKE 1 TABLET EVERY DAY   Simethicone 125 MG CAPS Take 125 mg by mouth daily as needed (gas).   vitamin E 180 MG (400 UNITS) capsule Take 400 Units by mouth daily.   ezetimibe (ZETIA) 10 MG tablet Take 1 tablet (10 mg total) by mouth daily.   No facility-administered encounter  medications on file as of 06/23/2021.

## 2021-06-25 MED ORDER — PREDNISONE 20 MG PO TABS: ORAL_TABLET | ORAL | 0 refills | Status: DC

## 2021-06-25 NOTE — Addendum Note (Signed)
Addended by: Owens Loffler on: 06/25/2021 05:04 PM   Modules accepted: Orders

## 2021-06-30 ENCOUNTER — Telehealth: Payer: Self-pay

## 2021-06-30 NOTE — Chronic Care Management (AMB) (Addendum)
Chronic Care Management Pharmacy Assistant   Name: Amanda [name]  MRN: [id number] DOB: [birth year]  Reason for Encounter: Adherence Review   Recent office visits:  06/23/21- Dr.Copland, PCP - Pt presented for follow-up post fall with groin pain, diabetes, hyperlipidemia. Start prednisone 20 mg course for 5 days. Follow up 6 weeks.  Recent consult visits:  05/20/21- Cardiology - Pt presented for CAD follow up. Chest pain resolving with 1 nitroglycerin. Order carotid doppler. No medication changes.  05/07/21- Podiatry - Pt presented for follow up visit. Start gabapentin 100 mg nightly and debrided her toenails 1 through 5 bilaterally.  Hospital visits:  None in previous 6 months  Medications: Outpatient Encounter Medications as of 06/30/2021  Medication Sig   acetaminophen (TYLENOL) 650 MG CR tablet Take 650 mg by mouth 3 times/day as needed-between meals & bedtime (for headaches).   albuterol (PROVENTIL HFA;VENTOLIN HFA) 108 (90 Base) MCG/ACT inhaler Inhale 2 puffs into the lungs every 4 (four) hours as needed for wheezing or shortness of breath.   amitriptyline (ELAVIL) 50 MG tablet TAKE 2 TABLETS BY MOUTH DAILY AT BEDTIME.   Ascorbic Acid (VITAMIN C) 1000 MG tablet Take 1,000 mg by mouth daily.   aspirin EC 81 MG tablet Take 81 mg by mouth daily.   buPROPion (WELLBUTRIN XL) 150 MG 24 hr tablet TAKE 1 TABLET (150 MG TOTAL) BY MOUTH DAILY.   calcium carbonate (TUMS - DOSED IN MG ELEMENTAL CALCIUM) 500 MG chewable tablet Chew 2 tablets by mouth daily as needed for indigestion or heartburn.   Cholecalciferol (VITAMIN D) 1000 UNITS capsule Take 1,000 Units by mouth daily.    clonazePAM (KLONOPIN) 0.5 MG tablet Take 1 tablet by mouth twice daily as needed   cyclobenzaprine (FLEXERIL) 10 MG tablet TAKE 1 TABLET THREE TIMES DAILY AS NEEDED FOR MUSCLE SPAMS   ezetimibe (ZETIA) 10 MG tablet Take 1 tablet (10 mg total) by mouth daily.   FLUoxetine (PROZAC) 40 MG capsule TAKE 1 CAPSULE (40 MG  TOTAL) BY MOUTH DAILY.   gabapentin (NEURONTIN) 100 MG capsule Take 1 capsule (100 mg total) by mouth at bedtime.   metoprolol tartrate (LOPRESSOR) 25 MG tablet TAKE 1/2 TABLET TWICE DAILY   Multiple Vitamin (MULTIVITAMIN) tablet Take 1 tablet by mouth daily.   naproxen sodium (ALEVE) 220 MG tablet Take 220 mg by mouth daily.   pantoprazole (PROTONIX) 40 MG tablet TAKE 1 TABLET 30 MINUTES PRIOR TO BREAKFAST   polycarbophil (FIBERCON) 625 MG tablet Take 625 mg by mouth daily as needed. FIBER THERAPY   predniSONE (DELTASONE) 20 MG tablet 2 tabs po daily for 5 days, then 1 tab po daily for 5 days   rosuvastatin (CRESTOR) 40 MG tablet TAKE 1 TABLET EVERY DAY   Simethicone 125 MG CAPS Take 125 mg by mouth daily as needed (gas).   vitamin E 180 MG (400 UNITS) capsule Take 400 Units by mouth daily.   No facility-administered encounter medications on file as of 06/30/2021.   Attempted contact with Amanda [name] 3 times on 06/30/21,07/02/21,07/03/21. Unsuccessful outreach. Will attempt contact next month.    [facility] Medications: Medication Name/mg Last Fill Days Supply Rosuvastatin '40mg'$   12/02/2020 90   The patient has not had an ED visit since last contact.   Care Gaps: Last annual wellness visit: 08/15/20 If Diabetic: Last eye exam / retinopathy screening:  Last diabetic foot exam: 05/07/21  PCP appointment on 08/04/21 and Cardiology appointment on 07/03/21  Amanda [name], CPP notified  [facility],  [place] Assistant [phone]  I have reviewed the care management and care coordination activities outlined in this encounter and I am certifying that I agree with the content of this note. No further action required.  Amanda [name], PharmD Clinical Pharmacist [facility] at [facility] [phone]

## 2021-07-03 ENCOUNTER — Ambulatory Visit (INDEPENDENT_AMBULATORY_CARE_PROVIDER_SITE_OTHER): Payer: Medicare Other

## 2021-07-03 ENCOUNTER — Other Ambulatory Visit: Payer: Self-pay

## 2021-07-03 DIAGNOSIS — R0989 Other specified symptoms and signs involving the circulatory and respiratory systems: Secondary | ICD-10-CM | POA: Diagnosis not present

## 2021-07-13 ENCOUNTER — Emergency Department: Payer: Medicare Other

## 2021-07-13 ENCOUNTER — Emergency Department
Admission: EM | Admit: 2021-07-13 | Discharge: 2021-07-13 | Disposition: A | Discharge status: Home / Self Care | Payer: Medicare Other | Attending: Emergency Medicine | Admitting: Emergency Medicine

## 2021-07-13 ENCOUNTER — Other Ambulatory Visit: Payer: Self-pay

## 2021-07-13 ENCOUNTER — Encounter: Payer: Self-pay | Admitting: Emergency Medicine

## 2021-07-13 DIAGNOSIS — Z955 Presence of coronary angioplasty implant and graft: Secondary | ICD-10-CM | POA: Diagnosis not present

## 2021-07-13 DIAGNOSIS — J439 Emphysema, unspecified: Secondary | ICD-10-CM | POA: Diagnosis not present

## 2021-07-13 DIAGNOSIS — S2242XA Multiple fractures of ribs, left side, initial encounter for closed fracture: Secondary | ICD-10-CM

## 2021-07-13 DIAGNOSIS — Q791 Other congenital malformations of diaphragm: Secondary | ICD-10-CM | POA: Diagnosis not present

## 2021-07-13 DIAGNOSIS — W19XXXA Unspecified fall, initial encounter: Secondary | ICD-10-CM | POA: Diagnosis not present

## 2021-07-13 DIAGNOSIS — S299XXA Unspecified injury of thorax, initial encounter: Secondary | ICD-10-CM | POA: Diagnosis present

## 2021-07-13 DIAGNOSIS — Z96653 Presence of artificial knee joint, bilateral: Secondary | ICD-10-CM | POA: Diagnosis not present

## 2021-07-13 DIAGNOSIS — I10 Essential (primary) hypertension: Secondary | ICD-10-CM | POA: Insufficient documentation

## 2021-07-13 DIAGNOSIS — J449 Chronic obstructive pulmonary disease, unspecified: Secondary | ICD-10-CM | POA: Diagnosis not present

## 2021-07-13 DIAGNOSIS — Z7982 Long term (current) use of aspirin: Secondary | ICD-10-CM | POA: Diagnosis not present

## 2021-07-13 DIAGNOSIS — Z79899 Other long term (current) drug therapy: Secondary | ICD-10-CM | POA: Insufficient documentation

## 2021-07-13 DIAGNOSIS — I2510 Atherosclerotic heart disease of native coronary artery without angina pectoris: Secondary | ICD-10-CM | POA: Diagnosis not present

## 2021-07-13 DIAGNOSIS — I700 Atherosclerosis of aorta: Secondary | ICD-10-CM | POA: Diagnosis not present

## 2021-07-13 DIAGNOSIS — F17210 Nicotine dependence, cigarettes, uncomplicated: Secondary | ICD-10-CM | POA: Diagnosis not present

## 2021-07-13 DIAGNOSIS — E1143 Type 2 diabetes mellitus with diabetic autonomic (poly)neuropathy: Secondary | ICD-10-CM | POA: Diagnosis not present

## 2021-07-13 MED ORDER — HYDROCODONE-ACETAMINOPHEN 5-325 MG PO TABS: 1.0000 | ORAL_TABLET | Freq: Three times a day (TID) | ORAL | 0 refills | Status: DC | PRN

## 2021-07-13 NOTE — ED Provider Notes (Signed)
[facility] Emergency Department Provider Note ____________________________________________  Time seen: 1715  I have reviewed the triage vital signs and the nursing notes.  HISTORY  Chief Complaint  Fall and Rib Injury   HPI Amanda [name] is a 70 y.o. female presents to the ER today with complaint of left-sided rib pain.  She reports this started 2 days ago after a fall.  She reports she was reaching between the coffee table in a chair to get her phone cord when she lost her balance and fell on the chair on her left side.  She describes the pain as sharp and stabbing.  The pain is worse with movement and with taking a deep breath.  The pain radiates around to her back.  She has not noticed any blood in her urine or stool.  She has taken Flexeril with minimal relief of symptoms.  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 2013: nl LV sys fxn, mild MR, no evidence of pulm htn; b. TTE 8/17: EF 60-65%, no RWMA, nl LV dia fxn,  mild AS, mod AI   CAD (coronary artery disease), native coronary artery 11/26/2020   Controlled type 2 diabetes mellitus with diabetic autonomic neuropathy, without long-term current use of insulin ([place]) 08/10/2007   Qualifier: Diagnosis of  By: Council Mechanic MD, Hilaria Ota    COPD (chronic obstructive pulmonary disease) ([place]) 10/30/2016   Depression    Diabetes ([place])    diet managed-type 11   GAD (generalized anxiety disorder)    GERD (gastroesophageal reflux disease)    Hiatal hernia with gastroesophageal reflux 1997   Hyperlipidemia    Hypertension    Osteoporosis    Tobacco abuse     Patient Active Problem List   Diagnosis Date Noted   CAD (coronary artery disease), native coronary artery 11/26/2020   ACS (acute coronary syndrome) ([place]) 11/11/2020   Nonrheumatic aortic valve stenosis    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 sting    COPD with chronic bronchitis ([place]) 10/30/2016   Gastroesophageal  reflux disease 09/17/2016   Smoker 05/10/2015   Solitary pulmonary nodule 03/28/2013   Essential hypertension    Vitamin D deficiency 05/29/2009   Pure hypercholesterolemia 12/07/2007   Major depressive disorder, recurrent episode, in partial remission ([place]) 12/07/2007   Controlled type 2 diabetes mellitus with diabetic autonomic neuropathy, without long-term current use of insulin ([place]) 08/10/2007    Past Surgical History:  Procedure Laterality Date   ABDOMINAL HYSTERECTOMY  1993   ANTERIOR CERVICAL DECOMP/DISCECTOMY FUSION N/A 10/26/2018   Procedure: ACDF C4-C5 C5-C6 C6-C7;  Surgeon: Kary Kos, MD;  Location: [place];  Service: Neurosurgery;  Laterality: N/A;   APPENDECTOMY     BACK SURGERY     BREAST BIOPSY Right 2007   benign   CHOLECYSTECTOMY     COLONOSCOPY     30 years ago was normal per pt.    DILATION AND CURETTAGE OF UTERUS     ESOPHAGEAL DILATION     x 3   fractured leg Right    LEFT HEART CATH AND CORONARY ANGIOGRAPHY N/A 11/11/2020   Procedure: LEFT HEART CATH AND CORONARY ANGIOGRAPHY;  Surgeon: Wellington Hampshire, MD;  Location: [facility];  Service: Cardiovascular;  Laterality: N/A;   OOPHORECTOMY     OVARIAN CYST REMOVAL  1972   SALIVARY GLAND SURGERY     TONSILLECTOMY     4  yoa   TOTAL KNEE ARTHROPLASTY Left 12/24/2015   Procedure: LEFT TOTAL KNEE ARTHROPLASTY;  Surgeon: [name], MD;  Location: [place];  Service: Orthopedics;  Laterality: Left;   TOTAL KNEE ARTHROPLASTY Right 12/30/2017   Procedure: RIGHT TOTAL KNEE ARTHROPLASTY;  Surgeon: [name], MD;  Location: [place];  Service: Orthopedics;  Laterality: Right;   WRIST FRACTURE SURGERY  11/2008    Prior to Admission medications   Medication Sig Start Date End Date Taking? Authorizing Provider  HYDROcodone-acetaminophen (NORCO/VICODIN) 5-325 MG tablet Take 1 tablet by mouth every 8 (eight) hours as needed for moderate pain. 07/13/21 07/13/22 Yes Truda Staub, Coralie Keens, NP  acetaminophen  (TYLENOL) 650 MG CR tablet Take 650 mg by mouth 3 times/day as needed-between meals & bedtime (for headaches).    [provider]  albuterol (PROVENTIL HFA;VENTOLIN HFA) 108 (90 Base) MCG/ACT inhaler Inhale 2 puffs into the lungs every 4 (four) hours as needed for wheezing or shortness of breath. 07/27/18   Copland, Frederico Hamman, MD  amitriptyline (ELAVIL) 50 MG tablet TAKE 2 TABLETS BY MOUTH DAILY AT BEDTIME. 01/24/21   Copland, Frederico Hamman, MD  Ascorbic Acid (VITAMIN C) 1000 MG tablet Take 1,000 mg by mouth daily.    [provider]  aspirin EC 81 MG tablet Take 81 mg by mouth daily.    [provider]  buPROPion (WELLBUTRIN XL) 150 MG 24 hr tablet TAKE 1 TABLET (150 MG TOTAL) BY MOUTH DAILY. 01/24/21   Copland, Frederico Hamman, MD  calcium carbonate (TUMS - DOSED IN MG ELEMENTAL CALCIUM) 500 MG chewable tablet Chew 2 tablets by mouth daily as needed for indigestion or heartburn.    [provider]  Cholecalciferol (VITAMIN D) 1000 UNITS capsule Take 1,000 Units by mouth daily.     [provider]  clonazePAM (KLONOPIN) 0.5 MG tablet Take 1 tablet by mouth twice daily as needed 05/27/21   Copland, Frederico Hamman, MD  cyclobenzaprine (FLEXERIL) 10 MG tablet TAKE 1 TABLET THREE TIMES DAILY AS NEEDED FOR MUSCLE SPAMS 06/17/20   Copland, Frederico Hamman, MD  ezetimibe (ZETIA) 10 MG tablet Take 1 tablet (10 mg total) by mouth daily. 11/08/20 05/20/21  Wellington Hampshire, MD  FLUoxetine (PROZAC) 40 MG capsule TAKE 1 CAPSULE (40 MG TOTAL) BY MOUTH DAILY. 01/24/21   Copland, Frederico Hamman, MD  gabapentin (NEURONTIN) 100 MG capsule Take 1 capsule (100 mg total) by mouth at bedtime. 05/07/21   Hyatt, Max T, DPM  metoprolol tartrate (LOPRESSOR) 25 MG tablet TAKE 1/2 TABLET TWICE DAILY 01/23/21   Wellington Hampshire, MD  Multiple Vitamin (MULTIVITAMIN) tablet Take 1 tablet by mouth daily.    [provider]  naproxen sodium (ALEVE) 220 MG tablet Take 220 mg by mouth daily.    [provider]   pantoprazole (PROTONIX) 40 MG tablet TAKE 1 TABLET 30 MINUTES PRIOR TO BREAKFAST 12/24/20   Copland, Frederico Hamman, MD  polycarbophil (FIBERCON) 625 MG tablet Take 625 mg by mouth daily as needed. FIBER THERAPY    [provider]  predniSONE (DELTASONE) 20 MG tablet 2 tabs po daily for 5 days, then 1 tab po daily for 5 days 06/25/21   Copland, Frederico Hamman, MD  rosuvastatin (CRESTOR) 40 MG tablet TAKE 1 TABLET EVERY DAY 02/27/21   Copland, Frederico Hamman, MD  Simethicone 125 MG CAPS Take 125 mg by mouth daily as needed (gas).    [provider]  vitamin E 180 MG (400 UNITS) capsule Take 400 Units by mouth daily.    [provider]    Allergies Honey bee venom [bee venom] and Diazepam  Family History  Problem Relation Age of Onset   Hyperlipidemia Mother    Arthritis Mother    Diabetes Mother    Heart disease Mother    Colon polyps Mother    Cancer Paternal Grandmother        breast   Hypercholesterolemia Sister    Colon polyps Sister    Breast cancer Sister    Colon cancer Neg Hx    Esophageal cancer Neg Hx    Rectal cancer Neg Hx    Stomach cancer Neg Hx    Pancreatic cancer Neg Hx     Social History Social History   Tobacco Use   Smoking status: Every Day    Packs/day: 0.75    Years: 45.00    Pack years: 33.75    Types: Cigarettes   Smokeless tobacco: Never   Tobacco comments:    Smoking half PPD but is trying to quit.   Vaping Use   Vaping Use: Never used  Substance Use Topics   Alcohol use: Yes    Alcohol/week: 0.0 standard drinks    Comment: glass of wine twice a month    Drug use: No    Review of Systems  Constitutional: Negative for fever, chills or body aches. Cardiovascular: Negative for chest pain or chest tightness. Respiratory: Negative for cough or shortness of breath. Gastrointestinal: Negative for blood in her stool. Genitourinary: Negative for blood in her urine. Musculoskeletal: Positive for left-sided rib pain.   Skin: Negative for  bruising or abrasion. Neurological: Negative for headaches, dizziness, focal weakness, tingling or numbness. ____________________________________________  PHYSICAL EXAM:  VITAL SIGNS: ED Triage Vitals  Enc Vitals Group     BP 07/13/21 1627 (!) 142/59     Pulse Rate 07/13/21 1627 80     Resp 07/13/21 1627 20     Temp 07/13/21 1627 98.8 F (37.1 C)     Temp Source 07/13/21 1627 Oral     SpO2 07/13/21 1627 98 %     Weight 07/13/21 1621 214 lb (97.1 kg)     Height 07/13/21 1621 '5\' 5"'$  (1.651 m)     Head Circumference --      Peak Flow --      Pain Score 07/13/21 1620 9     Pain Loc --      Pain Edu? --      Excl. in [place]? --     Constitutional: Alert and oriented.  Obese, appears in pain but in no distress. Head: Normocephalic and atraumatic. Eyes: Normal extraocular movements Cardiovascular: Normal rate, regular rhythm.  Murmur noted. Respiratory: Normal respiratory effort. No wheezes/rales/rhonchi. Gastrointestinal: Soft and nontender. No distention. Musculoskeletal: Pain with palpation of the lower anterolateral ribs on the left side. Neurologic:  Normal speech and language. No gross focal neurologic deficits are appreciated. Skin:  Skin is warm, dry and intact. No bruising or abrasion noted. ____________________________________________    EKG ED ECG REPORT   Date: 07/13/2021  EKG Time: 5:55 PM  Rate: 87  Rhythm: normal sinus rhythm,  normal EKG, normal sinus rhythm, unchanged from previous tracings, RBBB  Axis: left axis deviation  Intervals:none  ST&T Change: none  Narrative Interpretation: NSR with RBBB, unchanged from prior    ____________________________________________   RADIOLOGY IMPRESSION:  1. Left anterior seventh and eighth rib fractures.  2. No pneumothorax or pleural effusion.  ____________________________________________    INITIAL IMPRESSION / ASSESSMENT AND PLAN / ED  COURSE  Left Side Rib Pain s/p Fall:  DDx include rib contusion vs rib  fracture Xray right side ribs c/w 7th/8th rib fractures Unable to give her pain medication in the ER because she drove herself, and she declines Ibuprofen or Tylenol. RX for Hydrocodone 5-325 mg PO Q8H prn Encouraged deep breathing using a splinting technique Follow up with your PCP if symptoms persist or worsen      I reviewed the patient's prescription history over the last 12 months in the multi-state controlled substances database(s) that includes [place], [place], [place], [place], [place], [place], [place], [place], [place], [place], [place], [place], [place], and [place].  Results were notable for monthly Clonazepam prescribed by PCP, no recent narcotic use. ____________________________________________  FINAL CLINICAL IMPRESSION(S) / ED DIAGNOSES  Final diagnoses:  Multiple fractures of ribs, left side, initial encounter for closed fracture      Jearld Fenton, NP 07/13/21 1755    Delman Kitten, MD 07/13/21 2035

## 2021-07-13 NOTE — Discharge Instructions (Addendum)
You were seen today for left-sided rib pain status post a fall.  Your x-ray is consistent with left anterior seventh and eighth rib fractures.  We encourage deep breathing to prevent pneumonia.  Please use a pillow to splint against her ribs to prevent increased pain with deep breathing.  I have given you prescription for pain medication to take every 8 hours as needed.  Rib fractures heal with time and can take a few weeks.  Follow-up with your PCP as needed

## 2021-07-13 NOTE — ED Triage Notes (Signed)
Pt reports bent over to pick something up and lost her balance falling hurting her left chest on a chair

## 2021-07-15 ENCOUNTER — Telehealth: Payer: Self-pay | Admitting: Family Medicine

## 2021-07-15 NOTE — Telephone Encounter (Signed)
she fell last friday night and went to er on sunday and has 2 fracture ribs and she has an appointment at the end of the month, but set her up on 8/15 /22

## 2021-07-16 ENCOUNTER — Other Ambulatory Visit: Payer: Self-pay | Admitting: Family Medicine

## 2021-07-16 DIAGNOSIS — R519 Headache, unspecified: Secondary | ICD-10-CM

## 2021-07-21 ENCOUNTER — Ambulatory Visit (INDEPENDENT_AMBULATORY_CARE_PROVIDER_SITE_OTHER): Payer: Medicare Other | Admitting: Family Medicine

## 2021-07-21 ENCOUNTER — Encounter: Payer: Self-pay | Admitting: Family Medicine

## 2021-07-21 ENCOUNTER — Other Ambulatory Visit: Payer: Self-pay

## 2021-07-21 VITALS — BP 122/60 | HR 80 | Temp 97.9°F | Ht 65.5 in | Wt 222.8 lb

## 2021-07-21 DIAGNOSIS — F17200 Nicotine dependence, unspecified, uncomplicated: Secondary | ICD-10-CM

## 2021-07-21 DIAGNOSIS — S2242XA Multiple fractures of ribs, left side, initial encounter for closed fracture: Secondary | ICD-10-CM

## 2021-07-21 DIAGNOSIS — I25118 Atherosclerotic heart disease of native coronary artery with other forms of angina pectoris: Secondary | ICD-10-CM | POA: Diagnosis not present

## 2021-07-21 MED ORDER — HYDROCODONE-ACETAMINOPHEN 5-325 MG PO TABS: 1.0000 | ORAL_TABLET | Freq: Three times a day (TID) | ORAL | 0 refills | Status: DC | PRN

## 2021-07-21 NOTE — Progress Notes (Signed)
Amanda [name] T. Nathyn Luiz, MD, [place] at [facility] [place], [address]  Phone: [phone]  FAX: [phone]  Amanda [name] - 70 y.o. female  MRN [id number]  Date of Birth: [birth year]  Date: 07/21/2021  PCP: Owens Loffler, MD  Referral: Owens Loffler, MD  Chief Complaint  Patient presents with   Follow-up    ER Visit-Multiple Rib Fx   Fall    X 2    This visit occurred during the SARS-CoV-2 public health emergency.  Safety protocols were in place, including screening questions prior to the visit, additional usage of staff PPE, and extensive cleaning of exam room while observing appropriate contact time as indicated for disinfecting solutions.   Subjective:   Amanda [name] is a 71 y.o. very pleasant female patient with Body mass index is 36.5 kg/m. who presents with the following:  07/13/2021 Fall with rib fx. Fallen x 2 with multiple rib fractures.  Monday and then on Friday, she fell again She is still having some left anterior chest wall pain  L 7th and 8th rib fractures.  This was visualized on rib films. Anterior.   DG Ribs Unilateral W/Chest Left  Result Date: 07/13/2021 CLINICAL DATA:  [place] today.  Chest pain. EXAM: LEFT RIBS AND CHEST - 3+ VIEW COMPARISON:  11/10/2020 FINDINGS: The cardiac silhouette, mediastinal and hilar contours are within normal limits and stable. Mild tortuosity and calcification of the thoracic aorta is again noted. Chronic emphysematous and bronchitic type lung changes but no acute pulmonary findings. No pneumothorax or pleural effusion. Stable eventration of the left hemidiaphragm. Left anterior seventh and eighth rib fractures are noted. No significant displacement. IMPRESSION: 1. Left anterior seventh and eighth rib fractures. 2. No pneumothorax or pleural effusion. Electronically Signed   By: Marijo Sanes M.D.   On: 07/13/2021 17:15       Review of Systems is noted in  the HPI, as appropriate  Objective:   BP 122/60   Pulse 80   Temp 97.9 F (36.6 C) (Temporal)   Ht 5' 5.5" (1.664 m)   Wt 222 lb 12 oz (101 kg)   SpO2 97%   BMI 36.50 kg/m   GEN: No acute distress; alert,appropriate. PULM: Breathing comfortably in no respiratory distress PSYCH: Normally interactive.  Lungs clear throughout bilaterally. She still does have some anterior rib pain approximately at rib 7 or 8.  No posterior pain.  No pain in the sternum.  Laboratory and Imaging Data:  Assessment and Plan:     ICD-10-CM   1. Multiple fractures of ribs, left side, initial encounter for closed fracture  S22.42XA     2. Smoker  F17.200      Multiple rib fractures in the setting of fall x2.  She also is a smoker and has a history of fragility fracture on the wrist, increasing risk.  Anticipate 6 weeks for healing, potentially more in this case of a 70 year old who was not entirely healthy.  Meds ordered this encounter  Medications   HYDROcodone-acetaminophen (NORCO/VICODIN) 5-325 MG tablet    Sig: Take 1 tablet by mouth every 8 (eight) hours as needed for moderate pain.    Dispense:  15 tablet    Refill:  0   Medications Discontinued During This Encounter  Medication Reason   predniSONE (DELTASONE) 20 MG tablet Completed Course   HYDROcodone-acetaminophen (NORCO/VICODIN) 5-325 MG tablet Reorder   No orders of the defined types were placed in  this encounter.   Follow-up: No follow-ups on file.  Dragon [employer] One speech-to-text software was used for transcription in this dictation.  Possible transcriptional errors can occur using Editor, commissioning.   Signed,  Maud Deed. Kijana Estock, MD   Outpatient Encounter Medications as of 07/21/2021  Medication Sig   acetaminophen (TYLENOL) 650 MG CR tablet Take 650 mg by mouth 3 times/day as needed-between meals & bedtime (for headaches).   albuterol (PROVENTIL HFA;VENTOLIN HFA) 108 (90 Base) MCG/ACT inhaler Inhale 2 puffs into the  lungs every 4 (four) hours as needed for wheezing or shortness of breath.   amitriptyline (ELAVIL) 50 MG tablet TAKE 2 TABLETS BY MOUTH DAILY AT BEDTIME.   Ascorbic Acid (VITAMIN C) 1000 MG tablet Take 1,000 mg by mouth daily.   aspirin EC 81 MG tablet Take 81 mg by mouth daily.   buPROPion (WELLBUTRIN XL) 150 MG 24 hr tablet TAKE 1 TABLET (150 MG TOTAL) BY MOUTH DAILY.   calcium carbonate (TUMS - DOSED IN MG ELEMENTAL CALCIUM) 500 MG chewable tablet Chew 2 tablets by mouth daily as needed for indigestion or heartburn.   Cholecalciferol (VITAMIN D) 1000 UNITS capsule Take 1,000 Units by mouth daily.    clonazePAM (KLONOPIN) 0.5 MG tablet Take 1 tablet by mouth twice daily as needed   cyclobenzaprine (FLEXERIL) 10 MG tablet TAKE 1 TABLET THREE TIMES DAILY AS NEEDED FOR MUSCLE SPAMS   ezetimibe (ZETIA) 10 MG tablet Take 1 tablet (10 mg total) by mouth daily.   FLUoxetine (PROZAC) 40 MG capsule TAKE 1 CAPSULE (40 MG TOTAL) BY MOUTH DAILY.   gabapentin (NEURONTIN) 100 MG capsule Take 1 capsule (100 mg total) by mouth at bedtime.   metoprolol tartrate (LOPRESSOR) 25 MG tablet TAKE 1/2 TABLET TWICE DAILY   Multiple Vitamin (MULTIVITAMIN) tablet Take 1 tablet by mouth daily.   naproxen sodium (ALEVE) 220 MG tablet Take 220 mg by mouth daily.   pantoprazole (PROTONIX) 40 MG tablet TAKE 1 TABLET 30 MINUTES PRIOR TO BREAKFAST   polycarbophil (FIBERCON) 625 MG tablet Take 625 mg by mouth daily as needed. FIBER THERAPY   rosuvastatin (CRESTOR) 40 MG tablet TAKE 1 TABLET EVERY DAY   Simethicone 125 MG CAPS Take 125 mg by mouth daily as needed (gas).   vitamin E 180 MG (400 UNITS) capsule Take 400 Units by mouth daily.   [DISCONTINUED] HYDROcodone-acetaminophen (NORCO/VICODIN) 5-325 MG tablet Take 1 tablet by mouth every 8 (eight) hours as needed for moderate pain.   HYDROcodone-acetaminophen (NORCO/VICODIN) 5-325 MG tablet Take 1 tablet by mouth every 8 (eight) hours as needed for moderate pain.    [DISCONTINUED] predniSONE (DELTASONE) 20 MG tablet 2 tabs po daily for 5 days, then 1 tab po daily for 5 days   No facility-administered encounter medications on file as of 07/21/2021.

## 2021-07-30 ENCOUNTER — Other Ambulatory Visit: Payer: Self-pay

## 2021-07-30 ENCOUNTER — Encounter: Payer: Self-pay | Admitting: Podiatry

## 2021-07-30 ENCOUNTER — Ambulatory Visit (INDEPENDENT_AMBULATORY_CARE_PROVIDER_SITE_OTHER): Payer: Medicare Other | Admitting: Podiatry

## 2021-07-30 DIAGNOSIS — B351 Tinea unguium: Secondary | ICD-10-CM | POA: Diagnosis not present

## 2021-07-30 DIAGNOSIS — E1142 Type 2 diabetes mellitus with diabetic polyneuropathy: Secondary | ICD-10-CM

## 2021-07-30 DIAGNOSIS — M79676 Pain in unspecified toe(s): Secondary | ICD-10-CM | POA: Diagnosis not present

## 2021-07-30 NOTE — Progress Notes (Signed)
She presents today chief complaint of painfully elongated toenails.  Objective: Toenails are long thick yellow dystrophic-like mycotic history of diabetic peripheral neuropathy.  No open lesions or wounds are noted.  Assessment: Diabetic peripheral neuropathy pain limb secondary to onychomycosis.  Plan: Debridement of toenails 1 through 5 bilateral.

## 2021-08-04 ENCOUNTER — Ambulatory Visit (INDEPENDENT_AMBULATORY_CARE_PROVIDER_SITE_OTHER): Payer: Medicare Other | Admitting: Family Medicine

## 2021-08-04 ENCOUNTER — Other Ambulatory Visit: Payer: Self-pay

## 2021-08-04 ENCOUNTER — Encounter: Payer: Self-pay | Admitting: Family Medicine

## 2021-08-04 VITALS — BP 130/62 | HR 86 | Temp 98.1°F | Ht 65.5 in | Wt 225.8 lb

## 2021-08-04 DIAGNOSIS — G8929 Other chronic pain: Secondary | ICD-10-CM

## 2021-08-04 DIAGNOSIS — I25118 Atherosclerotic heart disease of native coronary artery with other forms of angina pectoris: Secondary | ICD-10-CM | POA: Diagnosis not present

## 2021-08-04 DIAGNOSIS — M549 Dorsalgia, unspecified: Secondary | ICD-10-CM

## 2021-08-04 DIAGNOSIS — S2242XA Multiple fractures of ribs, left side, initial encounter for closed fracture: Secondary | ICD-10-CM

## 2021-08-04 DIAGNOSIS — F3341 Major depressive disorder, recurrent, in partial remission: Secondary | ICD-10-CM

## 2021-08-04 NOTE — Progress Notes (Signed)
Amanda Mccreadie T. [name], MD, [place] at [facility] [place], [address]  Phone: [phone]  FAX: [phone]  LECIA [name] - 70 y.o. female  MRN [id number]  Date of Birth: [birth year]  Date: 08/04/2021  PCP: Owens Loffler, MD  Referral: Owens Loffler, MD  Chief Complaint  Patient presents with   Follow-up    6 week    This visit occurred during the SARS-CoV-2 public health emergency.  Safety protocols were in place, including screening questions prior to the visit, additional usage of staff PPE, and extensive cleaning of exam room while observing appropriate contact time as indicated for disinfecting solutions.   Subjective:   Amanda [name] is a 70 y.o. very pleasant female patient with Body mass index is 37 kg/m. who presents with the following:  F/u ribs fx.  Doing better but not fully better.  I have seen her couple times recently with some acute injuries, but multiple rib fractures as well as ongoing significant medical concerns. -The pain from the rib fractures are a lot better, but she is still symptomatic with coughing, deep breathing.  Multiple back / neck issues.  Back has been hurting since April and in [place] places.  Mid to thoracic back .  She also worries about her knees, both s/p total knee arthroplasty.  Review mood. Was really bad for a week.  Her best friend's husband died.  Family does not live here, and she had try to spend time with her.  - has helped her depression. Daughter is at home.  400 pounds.  Doing ok.  Back and knee L knee in 2017 R 2019 - many pains  Flu shot.   07/21/2021 Last OV with Owens Loffler, MD  07/13/2021 Fall with rib fx. Fallen x 2 with multiple rib fractures.  Monday and then on Friday, she fell again She is still having some left anterior chest wall pain   L 7th and 8th rib fractures.  This was visualized on rib films. Anterior.      Review  of Systems is noted in the HPI, as appropriate  Objective:   BP 130/62   Pulse 86   Temp 98.1 F (36.7 C) (Temporal)   Ht 5' 5.5" (1.664 m)   Wt 225 lb 12 oz (102.4 kg)   PF 97 L/min   BMI 37.00 kg/m   GEN: No acute distress; alert,appropriate. PULM: Breathing comfortably in no respiratory distress PSYCH: Normally interactive.  CV: RRR, + 3/5 heart murmur She continues to have pain in the left anterior chest wall.  Laboratory and Imaging Data:  Assessment and Plan:     ICD-10-CM   1. Multiple fractures of ribs, left side, initial encounter for closed fracture  S22.42XA     2. Chronic back pain greater than 3 months duration  M54.9    G89.29     3. Major depressive disorder, recurrent episode, in partial remission ([place])  F33.41      She has stabilized from prior injuries.  She also brings up some chronic pain from her back, neck, and bilateral knees.  All of these have had prior surgeries.  She wants to talk about all of these issues with her several different surgeons.  I am doubtful that any other additional surgery would be appropriate, but it is reasonable to have this discussion if for no other reason than reassurance.  Depression is stable.  Globally, she is looking much better  than she has recently.  Follow-up: Return in about 6 months (around 02/03/2022).  Dragon [employer] One speech-to-text software was used for transcription in this dictation.  Possible transcriptional errors can occur using Editor, commissioning.   Signed,  Maud Deed. Cayli Escajeda, MD   Outpatient Encounter Medications as of 08/04/2021  Medication Sig   acetaminophen (TYLENOL) 650 MG CR tablet Take 650 mg by mouth 3 times/day as needed-between meals & bedtime (for headaches).   albuterol (PROVENTIL HFA;VENTOLIN HFA) 108 (90 Base) MCG/ACT inhaler Inhale 2 puffs into the lungs every 4 (four) hours as needed for wheezing or shortness of breath.   amitriptyline (ELAVIL) 50 MG tablet TAKE 2 TABLETS BY  MOUTH DAILY AT BEDTIME.   Ascorbic Acid (VITAMIN C) 1000 MG tablet Take 1,000 mg by mouth daily.   aspirin EC 81 MG tablet Take 81 mg by mouth daily.   buPROPion (WELLBUTRIN XL) 150 MG 24 hr tablet TAKE 1 TABLET (150 MG TOTAL) BY MOUTH DAILY.   calcium carbonate (TUMS - DOSED IN MG ELEMENTAL CALCIUM) 500 MG chewable tablet Chew 2 tablets by mouth daily as needed for indigestion or heartburn.   Cholecalciferol (VITAMIN D) 1000 UNITS capsule Take 1,000 Units by mouth daily.    clonazePAM (KLONOPIN) 0.5 MG tablet Take 1 tablet by mouth twice daily as needed   cyclobenzaprine (FLEXERIL) 10 MG tablet TAKE 1 TABLET THREE TIMES DAILY AS NEEDED FOR MUSCLE SPAMS   ezetimibe (ZETIA) 10 MG tablet Take 1 tablet (10 mg total) by mouth daily.   FLUoxetine (PROZAC) 40 MG capsule TAKE 1 CAPSULE (40 MG TOTAL) BY MOUTH DAILY.   gabapentin (NEURONTIN) 100 MG capsule Take 1 capsule (100 mg total) by mouth at bedtime.   HYDROcodone-acetaminophen (NORCO/VICODIN) 5-325 MG tablet Take 1 tablet by mouth every 8 (eight) hours as needed for moderate pain.   metoprolol tartrate (LOPRESSOR) 25 MG tablet TAKE 1/2 TABLET TWICE DAILY   Multiple Vitamin (MULTIVITAMIN) tablet Take 1 tablet by mouth daily.   naproxen sodium (ALEVE) 220 MG tablet Take 220 mg by mouth daily.   pantoprazole (PROTONIX) 40 MG tablet TAKE 1 TABLET 30 MINUTES PRIOR TO BREAKFAST   polycarbophil (FIBERCON) 625 MG tablet Take 625 mg by mouth daily as needed. FIBER THERAPY   rosuvastatin (CRESTOR) 40 MG tablet TAKE 1 TABLET EVERY DAY   Simethicone 125 MG CAPS Take 125 mg by mouth daily as needed (gas).   vitamin E 180 MG (400 UNITS) capsule Take 400 Units by mouth daily.   No facility-administered encounter medications on file as of 08/04/2021.

## 2021-08-05 ENCOUNTER — Encounter: Payer: Self-pay | Admitting: Family Medicine

## 2021-08-15 ENCOUNTER — Telehealth: Payer: Self-pay

## 2021-08-15 NOTE — Progress Notes (Addendum)
Chronic Care Management Pharmacy Assistant   Name: Amanda [name]  MRN: [id number] DOB: [birth year]  Reason for Encounter: General Adherence   Recent office visits:  08/04/2021 - Amanda Loffler, MD - Patient present for f/u of fractured ribs. Follow up 6 months. 07/21/2021 - Amanda Loffler, MD - Patient presents for f/u ER visit for fractured ribs. Refill: HYDROcodone-acetaminophen 5-325 MG tablet  Recent consult visits:  07/30/2021 - Amanda [name], DPM - Patient presented for painful elongated toenails. Debridement of toenails 1 through 5 bilateral.  Hospital visits:  07/13/2021 [facility] - Patient presented for left side rib pain after a fall on 07/11/2021. Xray right side ribs c/w 7th/8th rib fractures. Started:  Hydrocodone 5-325 mg PO Q8H prn. Discharged same day.  Medication Reconciliation was completed by comparing discharge summary, patient's EMR and Pharmacy list, and upon discussion with patient.  Medications: Outpatient Encounter Medications as of 08/15/2021  Medication Sig   acetaminophen (TYLENOL) 650 MG CR tablet Take 650 mg by mouth 3 times/day as needed-between meals & bedtime (for headaches).   albuterol (PROVENTIL HFA;VENTOLIN HFA) 108 (90 Base) MCG/ACT inhaler Inhale 2 puffs into the lungs every 4 (four) hours as needed for wheezing or shortness of breath.   amitriptyline (ELAVIL) 50 MG tablet TAKE 2 TABLETS BY MOUTH DAILY AT BEDTIME.   Ascorbic Acid (VITAMIN C) 1000 MG tablet Take 1,000 mg by mouth daily.   aspirin EC 81 MG tablet Take 81 mg by mouth daily.   buPROPion (WELLBUTRIN XL) 150 MG 24 hr tablet TAKE 1 TABLET (150 MG TOTAL) BY MOUTH DAILY.   calcium carbonate (TUMS - DOSED IN MG ELEMENTAL CALCIUM) 500 MG chewable tablet Chew 2 tablets by mouth daily as needed for indigestion or heartburn.   Cholecalciferol (VITAMIN D) 1000 UNITS capsule Take 1,000 Units by mouth daily.    clonazePAM (KLONOPIN) 0.5 MG tablet Take 1 tablet by mouth twice daily as  needed   cyclobenzaprine (FLEXERIL) 10 MG tablet TAKE 1 TABLET THREE TIMES DAILY AS NEEDED FOR MUSCLE SPAMS   ezetimibe (ZETIA) 10 MG tablet Take 1 tablet (10 mg total) by mouth daily.   FLUoxetine (PROZAC) 40 MG capsule TAKE 1 CAPSULE (40 MG TOTAL) BY MOUTH DAILY.   gabapentin (NEURONTIN) 100 MG capsule Take 1 capsule (100 mg total) by mouth at bedtime.   HYDROcodone-acetaminophen (NORCO/VICODIN) 5-325 MG tablet Take 1 tablet by mouth every 8 (eight) hours as needed for moderate pain.   metoprolol tartrate (LOPRESSOR) 25 MG tablet TAKE 1/2 TABLET TWICE DAILY   Multiple Vitamin (MULTIVITAMIN) tablet Take 1 tablet by mouth daily.   naproxen sodium (ALEVE) 220 MG tablet Take 220 mg by mouth daily.   pantoprazole (PROTONIX) 40 MG tablet TAKE 1 TABLET 30 MINUTES PRIOR TO BREAKFAST   polycarbophil (FIBERCON) 625 MG tablet Take 625 mg by mouth daily as needed. FIBER THERAPY   rosuvastatin (CRESTOR) 40 MG tablet TAKE 1 TABLET EVERY DAY   Simethicone 125 MG CAPS Take 125 mg by mouth daily as needed (gas).   vitamin E 180 MG (400 UNITS) capsule Take 400 Units by mouth daily.   No facility-administered encounter medications on file as of 08/15/2021.    Contacted Amanda [name] on 08/15/2021 for general disease state and medication adherence call.   Patient is not > 5 days past due for refill on the following medications per chart history:  Star Medications: Medication Name/mg Last Fill Days Supply Rosuvastatin '40mg'$   06/26/2021      90   What concerns do you have about your medications? Patient state she does not.   The patient denies side effects with her medications.   How often do you forget or accidentally miss a dose? Rarely  Do you use a pillbox? Yes  Are you having any problems getting your medications from your pharmacy? No   Has the cost of your medications been a concern? No  Since last visit with CPP, no interventions have been made.  The patient has had an ED  visit since last contact.   The patient reports the following problems with their health. Patient states she is tired a lot, but feels that is from being 70 years old. States other than that she does not have any concerns. Patients states her fractured ribs are much better - only sore now, no pain.   Denies concerns or questions for Amanda [name], PharmD at this time.   Counseled patient on:  Great job taking medications  Care Gaps: Annual wellness visit in last year? Yes  08/15/2020 Most Recent BP reading: 130/62 on 08/04/2021  If Diabetic: Most recent A1C reading: 6.5 on 06/23/2021 Last eye exam / retinopathy screening: 07/31/2019 Last diabetic foot exam: 05/07/2021  Upcoming appointments: 11/06/2019 - Diabetic Foot Exam 12/05/2021 - Echocardiogram  Amanda [name], CPP notified  Amanda [name], [place] Assistant [phone]   I have reviewed the care management and care coordination activities outlined in this encounter and I am certifying that I agree with the content of this note. No further action required.  Amanda [name], PharmD Clinical Pharmacist [facility] [phone]

## 2021-09-08 ENCOUNTER — Telehealth: Payer: Self-pay | Admitting: Family Medicine

## 2021-09-08 NOTE — Telephone Encounter (Signed)
  Encourage patient to contact the pharmacy for refills or they can request refills through [place]:  Please schedule appointment if longer than 1 year  NEXT APPOINTMENT DATE:11/03/21  MEDICATION:cyclobenzaprine (FLEXERIL) 10 MG tablet,gabapentin (NEURONTIN) 100 MG capsule   Is the patient out of medication? no  [facility] [place]  Let patient know to contact pharmacy at the end of the day to make sure medication is ready.  Please notify patient to allow 48-72 hours to process  CLINICAL FILLS OUT ALL BELOW:   LAST REFILL:  QTY:  REFILL DATE:    OTHER COMMENTS:    Okay for refill?  Please advise

## 2021-09-08 NOTE — Telephone Encounter (Addendum)
Flexeril Last written 06-17-21 #50/3 Last OV 08-04-21 Next OV 11-03-21  [facility]  Gabapentin was last written by Dr Tyson Dense

## 2021-09-09 MED ORDER — GABAPENTIN 100 MG PO CAPS
100.0000 mg | ORAL_CAPSULE | Freq: Every day | ORAL | 3 refills | Status: DC
Start: 2021-09-09 — End: 2022-01-15

## 2021-09-09 MED ORDER — CYCLOBENZAPRINE HCL 10 MG PO TABS: ORAL_TABLET | ORAL | 3 refills | Status: DC

## 2021-09-09 NOTE — Addendum Note (Signed)
Addended by: Owens Loffler on: 09/09/2021 09:27 AM   Modules accepted: Orders

## 2021-09-24 ENCOUNTER — Other Ambulatory Visit: Payer: Self-pay | Admitting: Family Medicine

## 2021-10-27 ENCOUNTER — Telehealth: Payer: Self-pay

## 2021-10-27 NOTE — Progress Notes (Signed)
    Chronic Care Management Pharmacy Assistant   Name: Amanda [name]  MRN: [id number] DOB: 30-Apr-1951  Reason for Encounter: CCM (Appointment Reminder)   Medications: Outpatient Encounter Medications as of 10/27/2021  Medication Sig   acetaminophen (TYLENOL) 650 MG CR tablet Take 650 mg by mouth 3 times/day as needed-between meals & bedtime (for headaches).   albuterol (PROVENTIL HFA;VENTOLIN HFA) 108 (90 Base) MCG/ACT inhaler Inhale 2 puffs into the lungs every 4 (four) hours as needed for wheezing or shortness of breath.   amitriptyline (ELAVIL) 50 MG tablet TAKE 2 TABLETS BY MOUTH DAILY AT BEDTIME.   Ascorbic Acid (VITAMIN C) 1000 MG tablet Take 1,000 mg by mouth daily.   aspirin EC 81 MG tablet Take 81 mg by mouth daily.   buPROPion (WELLBUTRIN XL) 150 MG 24 hr tablet TAKE 1 TABLET (150 MG TOTAL) BY MOUTH DAILY.   calcium carbonate (TUMS - DOSED IN MG ELEMENTAL CALCIUM) 500 MG chewable tablet Chew 2 tablets by mouth daily as needed for indigestion or heartburn.   Cholecalciferol (VITAMIN D) 1000 UNITS capsule Take 1,000 Units by mouth daily.    clonazePAM (KLONOPIN) 0.5 MG tablet Take 1 tablet by mouth twice daily as needed   cyclobenzaprine (FLEXERIL) 10 MG tablet TAKE 1 TABLET THREE TIMES DAILY AS NEEDED FOR MUSCLE SPAMS   ezetimibe (ZETIA) 10 MG tablet Take 1 tablet (10 mg total) by mouth daily.   FLUoxetine (PROZAC) 40 MG capsule TAKE 1 CAPSULE (40 MG TOTAL) BY MOUTH DAILY.   gabapentin (NEURONTIN) 100 MG capsule Take 1 capsule (100 mg total) by mouth at bedtime.   HYDROcodone-acetaminophen (NORCO/VICODIN) 5-325 MG tablet Take 1 tablet by mouth every 8 (eight) hours as needed for moderate pain.   metoprolol tartrate (LOPRESSOR) 25 MG tablet TAKE 1/2 TABLET TWICE DAILY   Multiple Vitamin (MULTIVITAMIN) tablet Take 1 tablet by mouth daily.   naproxen sodium (ALEVE) 220 MG tablet Take 220 mg by mouth daily.   pantoprazole (PROTONIX) 40 MG tablet TAKE 1 TABLET 30 MINUTES PRIOR TO  BREAKFAST   polycarbophil (FIBERCON) 625 MG tablet Take 625 mg by mouth daily as needed. FIBER THERAPY   rosuvastatin (CRESTOR) 40 MG tablet TAKE 1 TABLET EVERY DAY   Simethicone 125 MG CAPS Take 125 mg by mouth daily as needed (gas).   vitamin E 180 MG (400 UNITS) capsule Take 400 Units by mouth daily.   No facility-administered encounter medications on file as of 10/27/2021.   A message was left RENESMAE DONAHEY to remind her of her upcoming telephone visit with Debbora Dus on 11/03/2021 at 10:00. Patient was reminded to have all medications, supplements and any blood glucose and blood pressure readings available for review at appointment.  Star Rating Drugs: Medication:  Last Fill: Day Supply Rosuvastatin 40mg  09/27/2021 90 Fill date verified with [place], CPP notified  Marijean Niemann, [place]  Time Spent: 10 Minutes

## 2021-11-03 ENCOUNTER — Telehealth: Payer: Medicare Other

## 2021-11-03 ENCOUNTER — Telehealth: Payer: Self-pay | Admitting: Family Medicine

## 2021-11-03 NOTE — Progress Notes (Deleted)
Chronic Care Management Pharmacy Note  11/03/2021 Name:  Amanda [name] MRN:  [id number] DOB:  16-Jan-1951  Summary: ***  Recommendations/Changes made from today's visit: ***  Plan: ***   Subjective: Amanda [name] is an 70 y.o. year old female who is a primary patient of Copland, Frederico Hamman, MD.  The CCM team was consulted for assistance with disease management and care coordination needs.    Engaged with patient by telephone for follow up visit in response to provider referral for pharmacy case management and/or care coordination services.   Consent to Services:  The patient was given information about Chronic Care Management services, agreed to services, and gave verbal consent prior to initiation of services.  Please see initial visit note for detailed documentation.   Patient Care Team: Owens Loffler, MD as PCP - General (Family Medicine) Debbora Dus, [facility] as Pharmacist (Pharmacist)  Recent office visits: 08/04/2021 - Owens Loffler, MD - Patient present for f/u of fractured ribs. Depression stable. Follow up 6 months. 07/21/2021 - Owens Loffler, MD - Patient presents for f/u ER visit for fractured ribs. Refill: HYDROcodone-acetaminophen 5-325 MG tablet   Recent consult visits:  07/30/2021 - Tyson Dense, DPM - Patient presented for painful elongated toenails. Debridement of toenails 1 through 5 bilateral. 05/20/2021 - Cardiology - Pt presented for follow up Mild CAD, aortic stenosis, palpitations, HLD. Continue current medications. Follow up 6 months.   Hospital visits:  07/13/2021 [facility] - Patient presented for left side rib pain after a fall on 07/11/2021. Xray right side ribs c/w 7th/8th rib fractures. Started:  Hydrocodone 5-325 mg PO Q8H prn. Discharged same day.   Objective:  Lab Results  Component Value Date   CREATININE 0.84 06/23/2021   BUN 14 06/23/2021   GFR 70.07 06/23/2021   GFRNONAA >60 11/10/2020   GFRAA >60 10/24/2019   NA 138  06/23/2021   K 4.4 06/23/2021   CALCIUM 9.4 06/23/2021   CO2 28 06/23/2021   GLUCOSE 120 (H) 06/23/2021    Lab Results  Component Value Date/Time   HGBA1C 6.5 06/23/2021 01:06 PM   HGBA1C 6.3 08/08/2020 10:21 AM   GFR 70.07 06/23/2021 01:06 PM   GFR 69.36 11/25/2020 03:27 PM   MICROALBUR 3.4 (H) 06/23/2021 01:06 PM   MICROALBUR 0.8 08/08/2020 10:21 AM    Last diabetic Eye exam: No results found for: HMDIABEYEEXA  Last diabetic Foot exam:  Lab Results  Component Value Date/Time   HMDIABFOOTEX done 12/18/2010 12:00 AM     Lab Results  Component Value Date   CHOL 129 06/23/2021   HDL 54.50 06/23/2021   LDLCALC 60 06/23/2021   LDLDIRECT 209.2 05/28/2009   TRIG 74.0 06/23/2021   CHOLHDL 2 06/23/2021    Hepatic Function Latest Ref Rng & Units 06/23/2021 08/08/2020 10/24/2019  Total Protein 6.0 - 8.3 g/dL 6.4 6.1 6.9  Albumin 3.5 - 5.2 g/dL 4.2 4.1 4.1  AST 0 - 37 U/L '22 15 21  ' ALT 0 - 35 U/L '19 12 16  ' Alk Phosphatase 39 - 117 U/L 35(L) 48 40  Total Bilirubin 0.2 - 1.2 mg/dL 0.6 0.5 0.8  Bilirubin, Direct 0.0 - 0.3 mg/dL 0.1 0.1 -    Lab Results  Component Value Date/Time   TSH 0.70 06/02/2017 01:27 PM   TSH 0.90 01/27/2017 04:24 PM    CBC Latest Ref Rng & Units 06/23/2021 11/25/2020 11/10/2020  WBC 4.0 - 10.5 K/uL 5.8 7.5 5.6  Hemoglobin 12.0 - 15.0 g/dL 13.3 12.4 12.5  Hematocrit 36.0 - 46.0 % 39.6 36.5 37.2  Platelets 150.0 - 400.0 K/uL 188.0 240.0 183    Lab Results  Component Value Date/Time   VD25OH 41.56 08/08/2020 10:21 AM   VD25OH 43.96 06/02/2017 01:27 PM    Clinical ASCVD: Yes  The ASCVD Risk score (Arnett DK, et al., 2019) failed to calculate for the following reasons:   The valid total cholesterol range is 130 to 320 mg/dL    Depression screen [facility] 2/9 08/15/2020 05/30/2019 06/02/2017  Decreased Interest 1 0 0  Down, Depressed, Hopeless 1 0 0  PHQ - 2 Score 2 0 0  Altered sleeping 2 0 -  Tired, decreased energy 3 0 -  Change in appetite 1 0 -   Feeling bad or failure about yourself  1 0 -  Trouble concentrating 3 0 -  Moving slowly or fidgety/restless 3 0 -  Suicidal thoughts 0 0 -  PHQ-9 Score 15 0 -  Difficult doing work/chores Somewhat difficult Not difficult at all -  Some recent data might be hidden    Social History   Tobacco Use  Smoking Status Every Day   Packs/day: 0.75   Years: 45.00   Pack years: 33.75   Types: Cigarettes  Smokeless Tobacco Never  Tobacco Comments   Smoking half PPD but is trying to quit.    BP Readings from Last 3 Encounters:  08/04/21 130/62  07/21/21 122/60  07/13/21 (!) 137/92   Pulse Readings from Last 3 Encounters:  08/04/21 86  07/21/21 80  07/13/21 72   Wt Readings from Last 3 Encounters:  08/04/21 225 lb 12 oz (102.4 kg)  07/21/21 222 lb 12 oz (101 kg)  07/13/21 214 lb (97.1 kg)   BMI Readings from Last 3 Encounters:  08/04/21 37.00 kg/m  07/21/21 36.50 kg/m  07/13/21 35.61 kg/m    Assessment/Interventions: Review of patient past medical history, allergies, medications, health status, including review of consultants reports, laboratory and other test data, was performed as part of comprehensive evaluation and provision of chronic care management services.   SDOH:  (Social Determinants of Health) assessments and interventions performed: Yes  SDOH Screenings   Alcohol Screen: Not on file  Depression (PHQ2-9): Not on file  Financial Resource Strain: Not on file  Food Insecurity: Not on file  Housing: Not on file  Physical Activity: Not on file  Social Connections: Not on file  Stress: Not on file  Tobacco Use: High Risk   Smoking Tobacco Use: Every Day   Smokeless Tobacco Use: Never   Passive Exposure: Not on file  Transportation Needs: Not on file    [place]  Allergies  Allergen Reactions   Honey Bee Venom [Bee Venom] Anaphylaxis   Diazepam Other (See Comments)    REACTION: pt states she gets very angry and abusive verbally on this medication      Medications Reviewed Today     Reviewed by Carter Kitten, CMA (Certified Medical Assistant) on 08/04/21 at 1440  Med List Status: <None>   Medication Order Taking? Sig Documenting Provider Last Dose Status Informant  acetaminophen (TYLENOL) 650 MG CR tablet [id number]  Take 650 mg by mouth 3 times/day as needed-between meals & bedtime (for headaches). [provider]  Active Self  albuterol (PROVENTIL HFA;VENTOLIN HFA) 108 (90 Base) MCG/ACT inhaler [id number]  Inhale 2 puffs into the lungs every 4 (four) hours as needed for wheezing or shortness of breath. Owens Loffler, MD  Active Self  amitriptyline (ELAVIL)  50 MG tablet [id number]  TAKE 2 TABLETS BY MOUTH DAILY AT BEDTIME. Copland, Frederico Hamman, MD  Active   Ascorbic Acid (VITAMIN C) 1000 MG tablet [id number]  Take 1,000 mg by mouth daily. [provider]  Active Self  aspirin EC 81 MG tablet [id number]  Take 81 mg by mouth daily. [provider]  Active Self  buPROPion (WELLBUTRIN XL) 150 MG 24 hr tablet [id number]  TAKE 1 TABLET (150 MG TOTAL) BY MOUTH DAILY. Copland, Frederico Hamman, MD  Active   calcium carbonate (TUMS - DOSED IN MG ELEMENTAL CALCIUM) 500 MG chewable tablet [id number]  Chew 2 tablets by mouth daily as needed for indigestion or heartburn. [provider]  Active Self  Cholecalciferol (VITAMIN D) 1000 UNITS capsule [id number]  Take 1,000 Units by mouth daily.  [provider]  Active Self  clonazePAM (KLONOPIN) 0.5 MG tablet [id number]  Take 1 tablet by mouth twice daily as needed Copland, Spencer, MD  Active   cyclobenzaprine (FLEXERIL) 10 MG tablet [id number]  TAKE 1 TABLET THREE TIMES DAILY AS NEEDED FOR MUSCLE SPAMS Copland, Spencer, MD  Active Self  ezetimibe (ZETIA) 10 MG tablet [id number]  Take 1 tablet (10 mg total) by mouth daily. Wellington Hampshire, MD  Active Self           Med Note Burnadette Peter, Chrystie Nose   Fri Dec 13, 2020 11:06 AM)    FLUoxetine (PROZAC) 40 MG capsule [id number]  TAKE 1  CAPSULE (40 MG TOTAL) BY MOUTH DAILY. Copland, Frederico Hamman, MD  Active   gabapentin (NEURONTIN) 100 MG capsule [id number]  Take 1 capsule (100 mg total) by mouth at bedtime. Hyatt, Max T, [place]  Active   HYDROcodone-acetaminophen (NORCO/VICODIN) 5-325 MG tablet [id number]  Take 1 tablet by mouth every 8 (eight) hours as needed for moderate pain. Copland, Frederico Hamman, MD  Active   metoprolol tartrate (LOPRESSOR) 25 MG tablet [id number]  TAKE 1/2 TABLET TWICE DAILY Wellington Hampshire, MD  Active   Multiple Vitamin (MULTIVITAMIN) tablet [id number]  Take 1 tablet by mouth daily. [provider]  Active Self  naproxen sodium (ALEVE) 220 MG tablet [id number]  Take 220 mg by mouth daily. [provider]  Active Self           Med Note Eugenio Hoes, MARINA C   Tue May 20, 2021  1:48 PM)    pantoprazole (PROTONIX) 40 MG tablet [id number]  TAKE 1 TABLET 30 MINUTES PRIOR TO BREAKFAST Copland, Spencer, MD  Active   polycarbophil (FIBERCON) 625 MG tablet [id number]  Take 625 mg by mouth daily as needed. FIBER THERAPY [provider]  Active Self           Med Note Britt Bottom   Tue May 20, 2021  1:48 PM)    rosuvastatin (CRESTOR) 40 MG tablet [id number]  TAKE 1 TABLET EVERY DAY Copland, Spencer, MD  Active   Simethicone 125 MG CAPS [id number]  Take 125 mg by mouth daily as needed (gas). [provider]  Active Self  vitamin E 180 MG (400 UNITS) capsule [id number]  Take 400 Units by mouth daily. [provider]  Active             Patient Active Problem List   Diagnosis Date Noted   CAD (coronary artery disease), native coronary artery 11/26/2020   ACS (acute coronary syndrome) ([place]) 11/11/2020   Nonrheumatic aortic valve stenosis    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 sting    COPD with chronic bronchitis ([place]) 10/30/2016  Gastroesophageal reflux disease 09/17/2016   Smoker 05/10/2015   Solitary pulmonary nodule 03/28/2013   Essential hypertension    Vitamin D deficiency 05/29/2009   Pure  hypercholesterolemia 12/07/2007   Major depressive disorder, recurrent episode, in partial remission ([place]) 12/07/2007   Controlled type 2 diabetes mellitus with diabetic autonomic neuropathy, without long-term current use of insulin ([place]) 08/10/2007    Immunization History  Administered Date(s) Administered   Fluad Quad(high Dose 65+) 08/15/2020   Influenza Split 09/09/2011, 10/10/2012   Influenza Whole 09/20/2006   Influenza, High Dose Seasonal PF 08/15/2019   Influenza,inj,Quad PF,6+ Mos 09/18/2015, 09/16/2016, 09/13/2017   PFIZER(Purple Top)SARS-COV-2 Vaccination 01/25/2020, 02/01/2020   PPD Test 12/27/2015   Pneumococcal Conjugate-13 09/18/2015   Pneumococcal Polysaccharide-23 11/23/2016   Td 12/26/1998, 09/13/2017    Conditions to be addressed/monitored:  {USCCMDZASSESSMENTOPTIONS:23563}  There are no care plans that you recently modified to display for this patient.   Current Barriers:  {pharmacybarriers:24917}  Pharmacist Clinical Goal(s):  Patient will {PHARMACYGOALCHOICES:24921} through collaboration with PharmD and provider.   Interventions: 1:1 collaboration with Owens Loffler, MD regarding development and update of comprehensive plan of care as evidenced by provider attestation and co-signature Inter-disciplinary care team collaboration (see longitudinal plan of care) Comprehensive medication review performed; medication list updated in electronic medical record  Hypertension (BP goal {CHL HP UPSTREAM Pharmacist BP ranges:[phone]}) -{US controlled/uncontrolled:25276} -Current treatment: *** -Medications previously tried: ***  -Current home readings: *** -Current dietary habits: *** -Current exercise habits: *** -{ACTIONS;DENIES/REPORTS:21021675::"Denies"} hypotensive/hypertensive symptoms -Educated on {CCM BP Counseling:25124} -Counseled to monitor BP at home ***, document, and provide log at future  appointments -{CCMPHARMDINTERVENTION:25122}  Diabetes (A1c goal {A1c goals:23924}) -{US controlled/uncontrolled:25276} -Current medications: *** -Medications previously tried: ***  -Current home glucose readings fasting glucose: *** post prandial glucose: *** -{ACTIONS;DENIES/REPORTS:21021675::"Denies"} hypoglycemic/hyperglycemic symptoms -Current meal patterns:  breakfast: ***  lunch: ***  dinner: *** snacks: *** drinks: *** -Current exercise: *** -Educated on {CCM DM COUNSELING:25123} -Counseled to check feet daily and get yearly eye exams -{CCMPHARMDINTERVENTION:25122}  COPD (Goal: control symptoms and prevent exacerbations) -{US controlled/uncontrolled:25276} -Current treatment  *** -Medications previously tried: ***  -Gold Grade: {CHL HP Upstream Pharm COPD Gold ZOXWR:[id number]} -Current COPD Classification:  {CHL HP Upstream Pharm COPD Classification:[phone]} -MMRC/CAT score: *** -Pulmonary function testing: *** -Exacerbations requiring treatment in last 6 months: *** -Patient {Actions; denies-reports:120008} consistent use of maintenance inhaler -Frequency of rescue inhaler use: *** -Counseled on {CCMINHALERCOUNSELING:25121} -{CCMPHARMDINTERVENTION:25122}  Depression/Anxiety (Goal: ***) -{US controlled/uncontrolled:25276} -Current treatment: *** -Medications previously tried/failed: *** -PHQ9: *** -GAD7: *** -Connected with *** for mental health support -Educated on {CCM mental health counseling:25127} -{CCMPHARMDINTERVENTION:25122}  Tobacco use (Goal ***) -{US controlled/uncontrolled:25276} -Previous quit attempts: *** -Current treatment  *** -Patient smokes {Time to first cigarette:23873} -Patient triggers include: {Smoking Triggers:23882} -On a scale of 1-10, reports MOTIVATION to quit is *** -On a scale of 1-10, reports CONFIDENCE in quitting is *** -{Smoking Cessation Counseling:23883} -{CCMPHARMDINTERVENTION:25122}  Patient Goals/Self-Care  Activities Patient will:  - {pharmacypatientgoals:24919}  Follow Up Plan: {CM FOLLOW UP BJYN:82956}   Medication Assistance: {MEDASSISTANCEINFO:25044}  Compliance/Adherence/Medication fill history: Care Gaps: ***  Star-Rating Drugs: ***  Patient's preferred pharmacy is:  [facility] [place], [place] [address] [address] Phone: [phone] Fax: [phone]  [place], [place] [address] Phone: [phone] Fax: [phone]  Uses pill box? {Yes or If no, why not?:20788} Pt endorses ***% compliance  We discussed: {Pharmacy options:24294} Patient  decided to: {[facility]  Care Plan and Follow Up Patient Decision:  {FOLLOWUP:24991}  Plan: {CM FOLLOW UP PLAN:25073}  ***

## 2021-11-03 NOTE — Telephone Encounter (Signed)
LVM for pr to rtn my call to schedule AWV with nha. Please schedule appt if pt calls the office.

## 2021-11-05 ENCOUNTER — Ambulatory Visit: Payer: Medicare Other | Admitting: Podiatry

## 2021-11-12 ENCOUNTER — Other Ambulatory Visit: Payer: Self-pay | Admitting: Family Medicine

## 2021-11-21 ENCOUNTER — Other Ambulatory Visit: Payer: Self-pay | Admitting: Cardiovascular Disease

## 2021-11-21 DIAGNOSIS — M25531 Pain in right wrist: Secondary | ICD-10-CM | POA: Diagnosis not present

## 2021-11-21 DIAGNOSIS — M13831 Other specified arthritis, right wrist: Secondary | ICD-10-CM | POA: Diagnosis not present

## 2021-11-21 DIAGNOSIS — M67833 Other specified disorders of tendon, right wrist: Secondary | ICD-10-CM | POA: Diagnosis not present

## 2021-11-21 NOTE — Telephone Encounter (Signed)
Please schedule 6 month F/U appointment. Thank you! 

## 2021-11-24 NOTE — Telephone Encounter (Signed)
Attempted to schedule no ans no vm  

## 2021-11-24 NOTE — Telephone Encounter (Signed)
Scheduled

## 2021-11-26 ENCOUNTER — Inpatient Hospital Stay
Admission: EM | Admit: 2021-11-26 | Discharge: 2021-11-29 | DRG: 603 | Disposition: A | Discharge status: Home / Self Care | Payer: Medicare Other | Attending: Internal Medicine | Admitting: Internal Medicine

## 2021-11-26 ENCOUNTER — Other Ambulatory Visit: Payer: Self-pay

## 2021-11-26 ENCOUNTER — Emergency Department: Payer: Medicare Other

## 2021-11-26 ENCOUNTER — Encounter: Payer: Self-pay | Admitting: Emergency Medicine

## 2021-11-26 DIAGNOSIS — L02511 Cutaneous abscess of right hand: Secondary | ICD-10-CM | POA: Diagnosis present

## 2021-11-26 DIAGNOSIS — M189 Osteoarthritis of first carpometacarpal joint, unspecified: Secondary | ICD-10-CM | POA: Diagnosis not present

## 2021-11-26 DIAGNOSIS — Z8261 Family history of arthritis: Secondary | ICD-10-CM

## 2021-11-26 DIAGNOSIS — F411 Generalized anxiety disorder: Secondary | ICD-10-CM | POA: Diagnosis not present

## 2021-11-26 DIAGNOSIS — Z8249 Family history of ischemic heart disease and other diseases of the circulatory system: Secondary | ICD-10-CM | POA: Diagnosis not present

## 2021-11-26 DIAGNOSIS — Z833 Family history of diabetes mellitus: Secondary | ICD-10-CM

## 2021-11-26 DIAGNOSIS — F32A Depression, unspecified: Secondary | ICD-10-CM | POA: Diagnosis not present

## 2021-11-26 DIAGNOSIS — L03113 Cellulitis of right upper limb: Principal | ICD-10-CM | POA: Diagnosis present

## 2021-11-26 DIAGNOSIS — E1143 Type 2 diabetes mellitus with diabetic autonomic (poly)neuropathy: Secondary | ICD-10-CM | POA: Diagnosis present

## 2021-11-26 DIAGNOSIS — R601 Generalized edema: Secondary | ICD-10-CM | POA: Diagnosis not present

## 2021-11-26 DIAGNOSIS — Y929 Unspecified place or not applicable: Secondary | ICD-10-CM | POA: Diagnosis not present

## 2021-11-26 DIAGNOSIS — Z7982 Long term (current) use of aspirin: Secondary | ICD-10-CM | POA: Diagnosis not present

## 2021-11-26 DIAGNOSIS — Z96653 Presence of artificial knee joint, bilateral: Secondary | ICD-10-CM | POA: Diagnosis present

## 2021-11-26 DIAGNOSIS — Z83438 Family history of other disorder of lipoprotein metabolism and other lipidemia: Secondary | ICD-10-CM

## 2021-11-26 DIAGNOSIS — I10 Essential (primary) hypertension: Secondary | ICD-10-CM | POA: Diagnosis present

## 2021-11-26 DIAGNOSIS — S61551A Open bite of right wrist, initial encounter: Secondary | ICD-10-CM | POA: Diagnosis not present

## 2021-11-26 DIAGNOSIS — J449 Chronic obstructive pulmonary disease, unspecified: Secondary | ICD-10-CM | POA: Diagnosis not present

## 2021-11-26 DIAGNOSIS — Z888 Allergy status to other drugs, medicaments and biological substances status: Secondary | ICD-10-CM

## 2021-11-26 DIAGNOSIS — L02413 Cutaneous abscess of right upper limb: Secondary | ICD-10-CM | POA: Diagnosis not present

## 2021-11-26 DIAGNOSIS — F17210 Nicotine dependence, cigarettes, uncomplicated: Secondary | ICD-10-CM | POA: Diagnosis present

## 2021-11-26 DIAGNOSIS — L02419 Cutaneous abscess of limb, unspecified: Secondary | ICD-10-CM | POA: Diagnosis present

## 2021-11-26 DIAGNOSIS — R2231 Localized swelling, mass and lump, right upper limb: Secondary | ICD-10-CM | POA: Diagnosis not present

## 2021-11-26 DIAGNOSIS — M7989 Other specified soft tissue disorders: Secondary | ICD-10-CM | POA: Diagnosis not present

## 2021-11-26 DIAGNOSIS — I2510 Atherosclerotic heart disease of native coronary artery without angina pectoris: Secondary | ICD-10-CM | POA: Diagnosis not present

## 2021-11-26 DIAGNOSIS — K219 Gastro-esophageal reflux disease without esophagitis: Secondary | ICD-10-CM | POA: Diagnosis present

## 2021-11-26 DIAGNOSIS — Z91030 Bee allergy status: Secondary | ICD-10-CM | POA: Diagnosis not present

## 2021-11-26 DIAGNOSIS — Z20822 Contact with and (suspected) exposure to covid-19: Secondary | ICD-10-CM | POA: Diagnosis present

## 2021-11-26 DIAGNOSIS — W5501XA Bitten by cat, initial encounter: Secondary | ICD-10-CM

## 2021-11-26 DIAGNOSIS — Z79899 Other long term (current) drug therapy: Secondary | ICD-10-CM | POA: Diagnosis not present

## 2021-11-26 DIAGNOSIS — Z981 Arthrodesis status: Secondary | ICD-10-CM | POA: Diagnosis not present

## 2021-11-26 DIAGNOSIS — E785 Hyperlipidemia, unspecified: Secondary | ICD-10-CM | POA: Diagnosis present

## 2021-11-26 DIAGNOSIS — A280 Pasteurellosis: Secondary | ICD-10-CM | POA: Diagnosis present

## 2021-11-26 LAB — CBC WITH DIFFERENTIAL/PLATELET
Abs Immature Granulocytes: 0.03 10*3/uL (ref 0.00–0.07)
Basophils Absolute: 0 10*3/uL (ref 0.0–0.1)
Basophils Relative: 0 %
Eosinophils Absolute: 0 10*3/uL (ref 0.0–0.5)
Eosinophils Relative: 0 %
HCT: 35.6 % — ABNORMAL LOW (ref 36.0–46.0)
Hemoglobin: 12 g/dL (ref 12.0–15.0)
Immature Granulocytes: 0 %
Lymphocytes Relative: 11 %
Lymphs Abs: 0.9 10*3/uL (ref 0.7–4.0)
MCH: 34.2 pg — ABNORMAL HIGH (ref 26.0–34.0)
MCHC: 33.7 g/dL (ref 30.0–36.0)
MCV: 101.4 fL — ABNORMAL HIGH (ref 80.0–100.0)
Monocytes Absolute: 0.8 10*3/uL (ref 0.1–1.0)
Monocytes Relative: 10 %
Neutro Abs: 7 10*3/uL (ref 1.7–7.7)
Neutrophils Relative %: 79 %
Platelets: 201 10*3/uL (ref 150–400)
RBC: 3.51 MIL/uL — ABNORMAL LOW (ref 3.87–5.11)
RDW: 11 % — ABNORMAL LOW (ref 11.5–15.5)
WBC: 8.9 10*3/uL (ref 4.0–10.5)
nRBC: 0 % (ref 0.0–0.2)

## 2021-11-26 LAB — COMPREHENSIVE METABOLIC PANEL
ALT: 20 U/L (ref 0–44)
AST: 24 U/L (ref 15–41)
Albumin: 3.7 g/dL (ref 3.5–5.0)
Alkaline Phosphatase: 48 U/L (ref 38–126)
Anion gap: 8 (ref 5–15)
BUN: 11 mg/dL (ref 8–23)
CO2: 26 mmol/L (ref 22–32)
Calcium: 9.4 mg/dL (ref 8.9–10.3)
Chloride: 99 mmol/L (ref 98–111)
Creatinine, Ser: 0.69 mg/dL (ref 0.44–1.00)
GFR, Estimated: >60 mL/min (ref >60)
Glucose, Bld: 153 mg/dL — ABNORMAL HIGH (ref 70–99)
Potassium: 3.4 mmol/L — ABNORMAL LOW (ref 3.5–5.1)
Sodium: 133 mmol/L — ABNORMAL LOW (ref 135–145)
Total Bilirubin: 0.8 mg/dL (ref 0.3–1.2)
Total Protein: 7.3 g/dL (ref 6.5–8.1)

## 2021-11-26 LAB — RESP PANEL BY RT-PCR (FLU A&B, COVID) ARPGX2
Influenza A by PCR: NEGATIVE
Influenza B by PCR: NEGATIVE
SARS Coronavirus 2 by RT PCR: NEGATIVE

## 2021-11-26 LAB — LACTIC ACID, PLASMA: Lactic Acid, Venous: 0.9 mmol/L (ref 0.5–1.9)

## 2021-11-26 MED ORDER — PIPERACILLIN-TAZOBACTAM 3.375 G IVPB 30 MIN: 3.3750 g | Freq: Three times a day (TID) | INTRAVENOUS | Status: DC

## 2021-11-26 MED ORDER — FLUOXETINE HCL 20 MG PO CAPS
40.0000 mg | ORAL_CAPSULE | Freq: Every day | ORAL | Status: DC
  Administered 2021-11-27 – 2021-11-29 (×2): 40 mg via ORAL
  Filled 2021-11-26 (×3): qty 2

## 2021-11-26 MED ORDER — PIPERACILLIN-TAZOBACTAM 3.375 G IVPB
3.3750 g | Freq: Three times a day (TID) | INTRAVENOUS | Status: DC
  Administered 2021-11-28 (×2): 3.375 g via INTRAVENOUS
  Filled 2021-11-26 (×2): qty 50

## 2021-11-26 MED ORDER — INSULIN ASPART 100 UNIT/ML IJ SOLN
0.0000 [IU] | Freq: Three times a day (TID) | INTRAMUSCULAR | Status: DC
  Administered 2021-11-27 (×3): 1 [IU] via SUBCUTANEOUS
  Filled 2021-11-26 (×3): qty 1

## 2021-11-26 MED ORDER — HEPARIN SODIUM (PORCINE) 5000 UNIT/ML IJ SOLN
5000.0000 [IU] | Freq: Three times a day (TID) | INTRAMUSCULAR | Status: DC
  Administered 2021-11-26 – 2021-11-28 (×5): 5000 [IU] via SUBCUTANEOUS
  Filled 2021-11-26 (×5): qty 1

## 2021-11-26 MED ORDER — ASPIRIN EC 81 MG PO TBEC
81.0000 mg | DELAYED_RELEASE_TABLET | Freq: Every day | ORAL | Status: DC
  Administered 2021-11-26 – 2021-11-28 (×3): 81 mg via ORAL
  Filled 2021-11-26 (×3): qty 1

## 2021-11-26 MED ORDER — MORPHINE SULFATE (PF) 4 MG/ML IV SOLN
4.0000 mg | Freq: Once | INTRAVENOUS | Status: AC
  Administered 2021-11-26: 19:00:00 4 mg via INTRAVENOUS
  Filled 2021-11-26: qty 1

## 2021-11-26 MED ORDER — CLINDAMYCIN PHOSPHATE 900 MG/50ML IV SOLN
900.0000 mg | Freq: Three times a day (TID) | INTRAVENOUS | Status: DC
  Administered 2021-11-27 (×2): 900 mg via INTRAVENOUS
  Filled 2021-11-26 (×4): qty 50

## 2021-11-26 MED ORDER — PANTOPRAZOLE SODIUM 40 MG PO TBEC
40.0000 mg | DELAYED_RELEASE_TABLET | Freq: Every day | ORAL | Status: DC
  Administered 2021-11-27 – 2021-11-29 (×3): 40 mg via ORAL
  Filled 2021-11-26 (×3): qty 1

## 2021-11-26 MED ORDER — SODIUM CHLORIDE 0.9 % IV SOLN: INTRAVENOUS | Status: DC

## 2021-11-26 MED ORDER — METOPROLOL TARTRATE 25 MG PO TABS
12.5000 mg | ORAL_TABLET | Freq: Two times a day (BID) | ORAL | Status: DC
  Administered 2021-11-26 – 2021-11-29 (×6): 12.5 mg via ORAL
  Filled 2021-11-26 (×6): qty 1

## 2021-11-26 MED ORDER — PIPERACILLIN-TAZOBACTAM 3.375 G IVPB 30 MIN
3.3750 g | Freq: Once | INTRAVENOUS | Status: AC
  Administered 2021-11-26: 16:00:00 3.375 g via INTRAVENOUS
  Filled 2021-11-26: qty 50

## 2021-11-26 MED ORDER — BUPROPION HCL ER (XL) 150 MG PO TB24
150.0000 mg | ORAL_TABLET | Freq: Every day | ORAL | Status: DC
  Administered 2021-11-26 – 2021-11-28 (×3): 150 mg via ORAL
  Filled 2021-11-26 (×3): qty 1

## 2021-11-26 MED ORDER — ROSUVASTATIN CALCIUM 10 MG PO TABS
40.0000 mg | ORAL_TABLET | Freq: Every day | ORAL | Status: DC
  Administered 2021-11-26 – 2021-11-28 (×3): 40 mg via ORAL
  Filled 2021-11-26: qty 2
  Filled 2021-11-26 (×2): qty 4

## 2021-11-26 MED ORDER — LIDOCAINE-EPINEPHRINE (PF) 1 %-1:200000 IJ SOLN
10.0000 mL | Freq: Once | INTRAMUSCULAR | Status: AC
  Administered 2021-11-26: 10 mL

## 2021-11-26 MED ORDER — HYDROCODONE-ACETAMINOPHEN 5-325 MG PO TABS
1.0000 | ORAL_TABLET | Freq: Three times a day (TID) | ORAL | Status: DC | PRN
  Administered 2021-11-26 – 2021-11-29 (×6): 1 via ORAL
  Filled 2021-11-26 (×7): qty 1

## 2021-11-26 MED ORDER — MORPHINE SULFATE (PF) 2 MG/ML IV SOLN
2.0000 mg | Freq: Four times a day (QID) | INTRAVENOUS | Status: DC | PRN
  Administered 2021-11-27 – 2021-11-28 (×6): 2 mg via INTRAVENOUS
  Filled 2021-11-26 (×6): qty 1

## 2021-11-26 MED ORDER — ONDANSETRON HCL 4 MG/2ML IJ SOLN
4.0000 mg | Freq: Once | INTRAMUSCULAR | Status: AC
  Administered 2021-11-26: 19:00:00 4 mg via INTRAVENOUS
  Filled 2021-11-26: qty 2

## 2021-11-26 MED ORDER — EZETIMIBE 10 MG PO TABS
10.0000 mg | ORAL_TABLET | Freq: Every day | ORAL | Status: DC
  Administered 2021-11-27 – 2021-11-28 (×2): 10 mg via ORAL
  Filled 2021-11-26 (×3): qty 1

## 2021-11-26 MED ORDER — LIDOCAINE-EPINEPHRINE (PF) 2 %-1:200000 IJ SOLN
10.0000 mL | Freq: Once | INTRAMUSCULAR | Status: DC
  Filled 2021-11-26: qty 20

## 2021-11-26 MED ORDER — IOHEXOL 300 MG/ML  SOLN
75.0000 mL | Freq: Once | INTRAMUSCULAR | Status: AC | PRN
  Administered 2021-11-26: 17:00:00 75 mL via INTRAVENOUS
  Filled 2021-11-26: qty 75

## 2021-11-26 MED ORDER — GABAPENTIN 100 MG PO CAPS
100.0000 mg | ORAL_CAPSULE | Freq: Every day | ORAL | Status: DC
  Administered 2021-11-26 – 2021-11-28 (×3): 100 mg via ORAL
  Filled 2021-11-26 (×3): qty 1

## 2021-11-26 NOTE — ED Triage Notes (Signed)
Pt via POV c/o right hand and arm pain since Friday when she noticed that it was first hurting. She went to [facility] on Friday night where they performed x-rays (pt has copies with her) but no obvious fracture noted per discharge instructions. Right hand is notably swollen, pt reports also since Friday, and she has been applying ice several times a day with minimal relief. Some limited ROM due to pain and swelling. She is in a wrist brace and a sling on arrival.

## 2021-11-26 NOTE — ED Notes (Signed)
Pt has redness and swelling to the right wrist and hand, pt states that her kitten has been biting and scratching her but has never had any type of issue before, pt states that [facility] was [facility] and placed on prednisone and states that she feels like the pain and swelling have gotten worse

## 2021-11-26 NOTE — ED Provider Notes (Signed)
[facility] Emergency Department Provider Note  ____________________________________________  Time seen: Approximately 4:15 PM  I have reviewed the triage vital signs and the nursing notes.   HISTORY  Chief Complaint Arm Injury    HPI Amanda [name] is a 70 y.o. female who presents to the emergency department with swelling and pain to the left wrist.  Patient states that this has been ongoing for 5 to 7 days but severely worsening the last 2-3.  Patient states that approximately 4 months ago she under arrest, this had completely resolved and she was unsure whether this may have been a return of her injury.  She states that she has been tugging on something, sprained her wrist 4 months ago.  However over the last 2 to 3 days she has noticed increased swelling, as well as redness and warmth to palpation.  During exam patient has multiple small scabs and she states that these are from her cat who likes to play by scratching and biting her.  There are several scabbed areas around the wrist joint.  No direct trauma to the wrist.  She is had no falls.  She is able to extend and flex all of her fingers.  Limited range of motion of the wrist due to pain.  Full range of motion to the elbow.  No history of recurrent skin infections, no history of DVT.       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 2013: nl LV sys fxn, mild MR, no evidence of pulm htn; b. TTE 8/17: EF 60-65%, no RWMA, nl LV dia fxn,  mild AS, mod AI   CAD (coronary artery disease), native coronary artery 11/26/2020   Controlled type 2 diabetes mellitus with diabetic autonomic neuropathy, without long-term current use of insulin ([place]) 08/10/2007   Qualifier: Diagnosis of  By: Council Mechanic MD, Hilaria Ota    COPD (chronic obstructive pulmonary disease) ([place]) 10/30/2016   Depression    GAD (generalized anxiety disorder)    GERD (gastroesophageal reflux  disease)    Hiatal hernia with gastroesophageal reflux 1997   Hyperlipidemia    Hypertension    Osteoporosis    Tobacco abuse     Patient Active Problem List   Diagnosis Date Noted   CAD (coronary artery disease), native coronary artery 11/26/2020   ACS (acute coronary syndrome) ([place]) 11/11/2020   Nonrheumatic aortic valve stenosis    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 sting    COPD with chronic bronchitis ([place]) 10/30/2016   Gastroesophageal reflux disease 09/17/2016   Smoker 05/10/2015   Solitary pulmonary nodule 03/28/2013   Essential hypertension    Vitamin D deficiency 05/29/2009   Pure hypercholesterolemia 12/07/2007   Major depressive disorder, recurrent episode, in partial remission ([place]) 12/07/2007   Controlled type 2 diabetes mellitus with diabetic autonomic neuropathy, without long-term current use of insulin ([place]) 08/10/2007    Past Surgical History:  Procedure Laterality Date   ABDOMINAL HYSTERECTOMY  1993   ANTERIOR CERVICAL DECOMP/DISCECTOMY FUSION N/A 10/26/2018   Procedure: ACDF C4-C5 C5-C6 C6-C7;  Surgeon: Kary Kos, MD;  Location: [place];  Service: Neurosurgery;  Laterality: N/A;   APPENDECTOMY     BACK SURGERY     BREAST BIOPSY Right 2007   benign   CHOLECYSTECTOMY     COLONOSCOPY     30 years ago was normal per pt.    DILATION AND CURETTAGE OF UTERUS  ESOPHAGEAL DILATION     x 3   fractured leg Right    LEFT HEART CATH AND CORONARY ANGIOGRAPHY N/A 11/11/2020   Procedure: LEFT HEART CATH AND CORONARY ANGIOGRAPHY;  Surgeon: Wellington Hampshire, MD;  Location: [place] [facility];  Service: Cardiovascular;  Laterality: N/A;   OOPHORECTOMY     OVARIAN CYST REMOVAL  1972   SALIVARY GLAND SURGERY     TONSILLECTOMY     4 yoa   TOTAL KNEE ARTHROPLASTY Left 12/24/2015   Procedure: LEFT TOTAL KNEE ARTHROPLASTY;  Surgeon: [name], MD;  Location: [place];  Service: Orthopedics;  Laterality: Left;   TOTAL KNEE ARTHROPLASTY Right 12/30/2017   Procedure: RIGHT TOTAL KNEE  ARTHROPLASTY;  Surgeon: [name], MD;  Location: [place];  Service: Orthopedics;  Laterality: Right;   WRIST FRACTURE SURGERY  11/2008    Prior to Admission medications   Medication Sig Start Date End Date Taking? Authorizing Provider  acetaminophen (TYLENOL) 650 MG CR tablet Take 650 mg by mouth 3 times/day as needed-between meals & bedtime (for headaches).    [provider]  albuterol (PROVENTIL HFA;VENTOLIN HFA) 108 (90 Base) MCG/ACT inhaler Inhale 2 puffs into the lungs every 4 (four) hours as needed for wheezing or shortness of breath. 07/27/18   Copland, Frederico Hamman, MD  amitriptyline (ELAVIL) 50 MG tablet TAKE 2 TABLETS BY MOUTH DAILY AT BEDTIME. 07/17/21   Copland, Frederico Hamman, MD  Ascorbic Acid (VITAMIN C) 1000 MG tablet Take 1,000 mg by mouth daily.    [provider]  aspirin EC 81 MG tablet Take 81 mg by mouth daily.    [provider]  buPROPion (WELLBUTRIN XL) 150 MG 24 hr tablet TAKE 1 TABLET (150 MG TOTAL) BY MOUTH DAILY. 07/17/21   Copland, Frederico Hamman, MD  calcium carbonate (TUMS - DOSED IN MG ELEMENTAL CALCIUM) 500 MG chewable tablet Chew 2 tablets by mouth daily as needed for indigestion or heartburn.    [provider]  Cholecalciferol (VITAMIN D) 1000 UNITS capsule Take 1,000 Units by mouth daily.     [provider]  clonazePAM (KLONOPIN) 0.5 MG tablet Take 1 tablet by mouth twice daily as needed 05/27/21   Copland, Frederico Hamman, MD  cyclobenzaprine (FLEXERIL) 10 MG tablet TAKE 1 TABLET THREE TIMES DAILY AS NEEDED FOR MUSCLE SPAMS 09/09/21   Copland, Frederico Hamman, MD  ezetimibe (ZETIA) 10 MG tablet Take 1 tablet (10 mg total) by mouth daily. 11/08/20   Wellington Hampshire, MD  FLUoxetine (PROZAC) 40 MG capsule TAKE 1 CAPSULE (40 MG TOTAL) BY MOUTH DAILY. 07/17/21   Copland, Frederico Hamman, MD  gabapentin (NEURONTIN) 100 MG capsule Take 1 capsule (100 mg total) by mouth at bedtime. 09/09/21   Copland, Frederico Hamman, MD  HYDROcodone-acetaminophen (NORCO/VICODIN)  5-325 MG tablet Take 1 tablet by mouth every 8 (eight) hours as needed for moderate pain. 07/21/21 07/21/22  Copland, Frederico Hamman, MD  metoprolol tartrate (LOPRESSOR) 25 MG tablet TAKE 1/2 TABLET TWICE DAILY 11/24/21   Wellington Hampshire, MD  Multiple Vitamin (MULTIVITAMIN) tablet Take 1 tablet by mouth daily.    [provider]  naproxen sodium (ALEVE) 220 MG tablet Take 220 mg by mouth daily.    [provider]  pantoprazole (PROTONIX) 40 MG tablet TAKE 1 TABLET 30 MINUTES PRIOR TO BREAKFAST 11/12/21   Copland, Frederico Hamman, MD  polycarbophil (FIBERCON) 625 MG tablet Take 625 mg by mouth daily as needed. FIBER THERAPY    [provider]  rosuvastatin (CRESTOR) 40 MG tablet TAKE  1 TABLET EVERY DAY 09/24/21   Copland, Frederico Hamman, MD  Simethicone 125 MG CAPS Take 125 mg by mouth daily as needed (gas).    [provider]  vitamin E 180 MG (400 UNITS) capsule Take 400 Units by mouth daily.    [provider]    Allergies Honey bee venom [bee venom] and Diazepam  Family History  Problem Relation Age of Onset   Hyperlipidemia Mother    Arthritis Mother    Diabetes Mother    Heart disease Mother    Colon polyps Mother    Cancer Paternal Grandmother        breast   Hypercholesterolemia Sister    Colon polyps Sister    Breast cancer Sister    Colon cancer Neg Hx    Esophageal cancer Neg Hx    Rectal cancer Neg Hx    Stomach cancer Neg Hx    Pancreatic cancer Neg Hx     Social History Social History   Tobacco Use   Smoking status: Every Day    Packs/day: 0.75    Years: 45.00    Pack years: 33.75    Types: Cigarettes   Smokeless tobacco: Never   Tobacco comments:    Smoking half PPD but is trying to quit.   Vaping Use   Vaping Use: Never used  Substance Use Topics   Alcohol use: Yes    Alcohol/week: 0.0 standard drinks    Comment: glass of wine twice a month    Drug use: No     Review of Systems  Constitutional: No fever/chills Eyes: No  visual changes. No discharge ENT: No upper respiratory complaints. Cardiovascular: no chest pain. Respiratory: no cough. No SOB. Gastrointestinal: No abdominal pain.  No nausea, no vomiting.  No diarrhea.  No constipation. Musculoskeletal: Right wrist erythema and edema as well as pain Skin: Negative for rash, abrasions, lacerations, ecchymosis. Neurological: Negative for headaches, focal weakness or numbness.  10 System ROS otherwise negative.  ____________________________________________   PHYSICAL EXAM:  VITAL SIGNS: ED Triage Vitals  Enc Vitals Group     BP 11/26/21 1422 (!) 141/73     Pulse Rate 11/26/21 1422 (!) 112     Resp 11/26/21 1422 19     Temp 11/26/21 1422 98.7 F (37.1 C)     Temp Source 11/26/21 1422 Oral     SpO2 11/26/21 1422 95 %     Weight 11/26/21 1423 235 lb (106.6 kg)     Height 11/26/21 1423 5\' 6"  (1.676 m)     Head Circumference --      Peak Flow --      Pain Score 11/26/21 1422 10     Pain Loc --      Pain Edu? --      Excl. in [place]? --      Constitutional: Alert and oriented. Well appearing and in no acute distress. Eyes: Conjunctivae are normal. PERRL. EOMI. Head: Atraumatic. ENT:      Ears:       Nose: No congestion/rhinnorhea.      Mouth/Throat: Mucous membranes are moist.  Neck: No stridor.    Cardiovascular: Normal rate, regular rhythm. Normal S1 and S2.  Good peripheral circulation. Respiratory: Normal respiratory effort without tachypnea or retractions. Lungs CTAB. Good air entry to the bases with no decreased or absent breath sounds. Musculoskeletal: Full range of motion to all extremities. No gross deformities appreciated.  H examination of the wrist reveals erythema and edema of the  wrist, with higher concentration of erythema and edema along the distal ulna region along the medial wrist.  Patient does have edema of the hand primarily along the dorsal aspect extending into the forearm approximately 3 inches proximal to the wrist joint  itself.  Patient has limited range of motion of the wrist at this time due to pain though she does have full range of motion to all 5 digits.  Sensation and capillary refill intact.  There are multiple small scratches and puncture wounds consistent with injury sustained from her pet cat.  There are multiple of these in the soft tissue adjacent to erythema and edema.  There is no fluctuance concerning for superficial abscess.  Compartments are firm but not hard.  Pulses, sensation are intact distally.  Examination of the elbow is unremarkable. Neurologic:  Normal speech and language. No gross focal neurologic deficits are appreciated.  Skin:  Skin is warm, dry and intact. No rash noted. Psychiatric: Mood and affect are normal. Speech and behavior are normal. Patient exhibits appropriate insight and judgement.   ____________________________________________   LABS (all labs ordered are listed, but only abnormal results are displayed)  Labs Reviewed  COMPREHENSIVE METABOLIC PANEL - Abnormal; Notable for the following components:      Result Value   Sodium 133 (*)    Potassium 3.4 (*)    Glucose, Bld 153 (*)    All other components within normal limits  CBC WITH DIFFERENTIAL/PLATELET - Abnormal; Notable for the following components:   RBC 3.51 (*)    HCT 35.6 (*)    MCV 101.4 (*)    MCH 34.2 (*)    RDW 11.0 (*)    All other components within normal limits  CULTURE, BLOOD (ROUTINE X 2)  CULTURE, BLOOD (ROUTINE X 2)  AEROBIC CULTURE W GRAM STAIN (SUPERFICIAL SPECIMEN)  RESP PANEL BY RT-PCR (FLU A&B, COVID) ARPGX2  LACTIC ACID, PLASMA   ____________________________________________  EKG   ____________________________________________  RADIOLOGY I personally viewed and evaluated these images as part of my medical decision making, as well as reviewing the written report by the radiologist.  ED Provider Interpretation: Soft tissue edema x-rays of the wrist and hand.  No osseous  abnormality or retained foreign body.  Clinical picture is concerning for infection.  CT scan of the right wrist reveals loculated fluid collection concerning for abscess.  It appears that this is in the soft tissue, however I discussed the results with orthopedic surgeon, Dr. Rudene Christians to confirm.  He also feels that this is not entered the joint space or ligaments of the wrist.  DG Wrist Complete Right  Result Date: 11/26/2021 CLINICAL DATA:  A 70 year old female presents with pain and swelling of the RIGHT arm and hand since Friday. EXAM: RIGHT WRIST - COMPLETE 3+ VIEW COMPARISON:  Hand evaluation of the same date. Also RIGHT wrist from May of 2021. FINDINGS: Diffuse soft tissue swelling about the wrist best appreciated on the lateral projection. No visible fracture or sign of dislocation. No sign of acute bony destruction. IMPRESSION: Diffuse soft tissue swelling about the right wrist. No acute osseous abnormality or radiographic evidence of bone destruction. Findings are nonspecific. Would however suggest correlation with any history of trauma or signs of infection. Electronically Signed   By: Zetta Bills M.D.   On: 11/26/2021 15:15   CT WRIST RIGHT W CONTRAST  Result Date: 11/26/2021 CLINICAL DATA:  Soft tissue infection suspected, wrist. EXAM: CT OF THE UPPER RIGHT EXTREMITY WITH CONTRAST TECHNIQUE: Multidetector  CT imaging of the upper right extremity was performed according to the standard protocol following intravenous contrast administration. CONTRAST:  33mL OMNIPAQUE IOHEXOL 300 MG/ML  SOLN COMPARISON:  Radiograph performed earlier on the same date FINDINGS: Bones/Joint/Cartilage No evidence of fracture or dislocation. No cortical erosion or periosteal reaction concerning for osteomyelitis. Mild degenerative changes of the first carpometacarpal joint. Radiocarpal and intercarpal joints are intact. Ligaments Suboptimally assessed by CT. Muscles and Tendons Normal muscle density in size. No  intramuscular collection or abscess. Soft tissues There is a subcutaneous peripherally enhancing fluid collection on the dorsal/ulnar aspect of the wrist measuring approximately 2.3 x 0.8 x 1.4 cm (series 7 image 155, series 9, image 81). Generalized subcutaneous soft tissue edema and skin thickening of the wrist and forearm. IMPRESSION: 1. Peripherally enhancing fluid collection about the dorsal/ulnar aspect of the wrist measuring at least 2.3 x 0.8 x 1.4 cm concerning for abscess. Clinical correlation is suggested. 2. Generalized subcutaneous soft tissue edema and skin thickening of the wrist and forearm. 3. No acute osseous abnormality. Electronically Signed   By: Keane Police D.O.   On: 11/26/2021 17:46   DG Hand Complete Right  Result Date: 11/26/2021 CLINICAL DATA:  Pain and swelling about the hand in a 70 year old female. EXAM: RIGHT HAND - COMPLETE 3+ VIEW COMPARISON:  Remote imaging from Apr 17, 2020 and prior wrist imaging of the same date. FINDINGS: Soft tissue swelling about the the volar and dorsal aspect of the wrist. No visible fracture. No signs of bony destruction. Degenerative changes are noted about the hand in the first carpometacarpal joint and distal interphalangeal joints. IMPRESSION: Soft tissue swelling about the volar and dorsal aspect of the wrist. No visible fracture or bony destruction. Correlate with any history of trauma or signs of infection. Degenerative changes about the wrist and hand. Electronically Signed   By: Zetta Bills M.D.   On: 11/26/2021 15:17    ____________________________________________    PROCEDURES  Procedure(s) performed:    Marland KitchenMarland KitchenIncision and Drainage  Date/Time: 11/26/2021 8:43 PM Performed by: Darletta Moll, PA-C Authorized by: Darletta Moll, PA-C   Consent:    Consent obtained:  Verbal   Consent given by:  Patient   Risks discussed:  Bleeding, incomplete drainage, pain and damage to other organs Universal protocol:     Procedure explained and questions answered to patient or proxy's satisfaction: yes     Immediately prior to procedure, a time out was called: yes     Patient identity confirmed:  Verbally with patient Location:    Type:  Abscess   Location:  Upper extremity   Upper extremity location:  Wrist   Wrist location:  R wrist Pre-procedure details:    Skin preparation:  Antiseptic wash and povidone-iodine Sedation:    Sedation type:  None Anesthesia:    Anesthesia method:  Local infiltration   Local anesthetic:  Lidocaine 1% WITH epi Procedure type:    Complexity:  Complex Procedure details:    Ultrasound guidance: yes     Needle aspiration: no     Incision types:  Single straight   Incision depth:  Subcutaneous   Wound management:  Probed and deloculated and irrigated with saline   Drainage:  Purulent and bloody   Drainage amount:  Moderate   Wound treatment:  Wound left open Post-procedure details:    Procedure completion:  Tolerated well, no immediate complications Ultrasound ED Soft Tissue  Date/Time: 11/26/2021 8:44 PM Performed by: Darletta Moll, PA-C Authorized  by: Darletta Moll, PA-C   Procedure details:    Indications: localization of abscess     Transverse view:  Visualized   Longitudinal view:  Visualized   Images: not archived   Location:    Location: upper extremity     Side:  Right Findings:     abscess present    cellulitis present    no foreign body present    Medications  piperacillin-tazobactam (ZOSYN) IVPB 3.375 g (0 g Intravenous Stopped 11/26/21 1658)  iohexol (OMNIPAQUE) 300 MG/ML solution 75 mL (75 mLs Intravenous Contrast Given 11/26/21 1706)  ondansetron (ZOFRAN) injection 4 mg (4 mg Intravenous Given 11/26/21 1925)  morphine 4 MG/ML injection 4 mg (4 mg Intravenous Given 11/26/21 1928)  lidocaine-EPINEPHrine (XYLOCAINE-EPINEPHrine) 1 %-1:200000 (PF) injection 10 mL (10 mLs Infiltration Given 11/26/21 2022)      ____________________________________________   INITIAL IMPRESSION / ASSESSMENT AND PLAN / ED COURSE  Pertinent labs & imaging results that were available during my care of the patient were reviewed by me and considered in my medical decision making (see chart for details).  Review of the [place] CSRS was performed in accordance of the [place] prior to dispensing any controlled drugs.           Patient's diagnosis is consistent with abscess of the right wrist.  Patient presented to the emergency department pain, swelling of the right wrist.  This was atraumatic.  Patient was noted to have multiple punctures and scratches to her right hand and forearm which she states is from her pet cat.  Evidently the cat plays, and the patient has sustained injuries during play.  This is a frequent occurrence for the patient but unfortunately appears that this is the source of patient's infection on her right wrist.  Patient had x-ray initially which revealed no evidence of osteomyelitis.  I was really concerned for deep space infection and pursued CT scan.  This returns with a loculated fluid collection concerning for abscess.  I discussed the patient with Dr. Rudene Christians to confirm that this was amenable to drainage at bedside versus needing to proceed to the OR.  Dr. Rudene Christians feels that this would be appropriate to drain at bedside.  Using ultrasound for better visualization, abscess was incised and drained at bedside.  Wound culture obtained.  Her labs are reassuring at this time, lactic is in the normal range.  Patient has received Zosyn and will require admission for further IV antibiotics.  I discussed the patient with hospitalist, Dr. Posey Pronto who agrees to admit her to the hospitalist service..  Patient is given ED precautions to return to the ED for any worsening or new symptoms.     ____________________________________________  FINAL CLINICAL IMPRESSION(S) / ED DIAGNOSES  Final diagnoses:  Abscess of skin of  right wrist  Cat bite, initial encounter      NEW MEDICATIONS STARTED DURING THIS VISIT:  ED Discharge Orders     None           This chart was dictated using voice recognition software/Dragon. Despite best efforts to proofread, errors can occur which can change the meaning. Any change was purely unintentional.    Darletta Moll, PA-C 11/26/21 2044    Harvest Dark, MD 11/26/21 2258

## 2021-11-26 NOTE — H&P (Addendum)
History and Physical    Amanda [name]:[id number] DOB: 1951/10/05 DOA: 11/26/2021  PCP: Owens Loffler, MD    Patient coming from:  Home    Chief Complaint:  Right wrist pain    HPI:  Amanda [name] is a 70 y.o. female seen in ed with complaints of left wrist pain since October.  Patient comes today with complains of complaining of increasing pain for the past 2 days.  Has multiple bite marks and scratch marks from cat biting.  Suspect this to be the source of infection. 1 week right  wrist pain., but 2 day ago and increased pain with  erythema. H/o pain getting worse over the past few days.  No external drainage but does have redness and swelling of the area affected.  In october she pulled on something and strained it  in ed wrist. Appear swollen, Pt has past medical history of diazepam and honeybee venom.   ED Course:  Vitals:   11/26/21 1422 11/26/21 1423 11/26/21 1754  BP: (!) 141/73  (!) 167/70  Pulse: (!) 112  99  Resp: 19  18  Temp: 98.7 F (37.1 C)  98.8 F (37.1 C)  TempSrc: Oral  Oral  SpO2: 95%  94%  Weight:  106.6 kg   Height:  5\' 6"  (1.676 m)   In the emergency room patient meets SIRS criteria, is tachycardic hypertensive with O2 sats of 98%, patient given Zofran for nausea, morphine for pain, Zosyn for infection, found on CT scan of the wrist which showed a wrist abscess.  ED provider has discussed case with orthopedics on-call who recommended IV antibiotics and will consult if needed Dr. Rudene Christians.  CMP shows sodium of 133 potassium of 3.4 glucose of 153, normal LFTs normal kidney function, initial lactic of 0.9, CBC shows normal white count 3.51, hemoglobin of 20  Review of Systems:  Review of Systems  Musculoskeletal:  Positive for joint pain.       Rt hand, forearm, swollen and erythematous.  Warm.   All other systems reviewed and are negative.   Past Medical History:  Diagnosis Date   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 sting    bee stings   Aortic stenosis     a. 2013: nl LV sys fxn, mild MR, no evidence of pulm htn; b. TTE 8/17: EF 60-65%, no RWMA, nl LV dia fxn,  mild AS, mod AI   CAD (coronary artery disease), native coronary artery 11/26/2020   Controlled type 2 diabetes mellitus with diabetic autonomic neuropathy, without long-term current use of insulin ([place]) 08/10/2007   Qualifier: Diagnosis of  By: Council Mechanic MD, Hilaria Ota    COPD (chronic obstructive pulmonary disease) ([place]) 10/30/2016   Depression    GAD (generalized anxiety disorder)    GERD (gastroesophageal reflux disease)    Hiatal hernia with gastroesophageal reflux 1997   Hyperlipidemia    Hypertension    Osteoporosis    Tobacco abuse     Past Surgical History:  Procedure Laterality Date   ABDOMINAL HYSTERECTOMY  1993   ANTERIOR CERVICAL DECOMP/DISCECTOMY FUSION N/A 10/26/2018   Procedure: ACDF C4-C5 C5-C6 C6-C7;  Surgeon: Kary Kos, MD;  Location: [facility];  Service: Neurosurgery;  Laterality: N/A;   APPENDECTOMY     BACK SURGERY     BREAST BIOPSY Right 2007   benign   CHOLECYSTECTOMY     COLONOSCOPY     30 years ago was normal per pt.    DILATION AND CURETTAGE OF UTERUS  ESOPHAGEAL DILATION     x 3   fractured leg Right    LEFT HEART CATH AND CORONARY ANGIOGRAPHY N/A 11/11/2020   Procedure: LEFT HEART CATH AND CORONARY ANGIOGRAPHY;  Surgeon: Wellington Hampshire, MD;  Location: [facility];  Service: Cardiovascular;  Laterality: N/A;   OOPHORECTOMY     OVARIAN CYST REMOVAL  1972   SALIVARY GLAND SURGERY     TONSILLECTOMY     4 yoa   TOTAL KNEE ARTHROPLASTY Left 12/24/2015   Procedure: LEFT TOTAL KNEE ARTHROPLASTY;  Surgeon: [name], MD;  Location: [facility];  Service: Orthopedics;  Laterality: Left;   TOTAL KNEE ARTHROPLASTY Right 12/30/2017   Procedure: RIGHT TOTAL KNEE ARTHROPLASTY;  Surgeon: [name], MD;  Location: [facility];  Service: Orthopedics;  Laterality: Right;   WRIST FRACTURE SURGERY  11/2008     reports that she has been smoking  cigarettes. She has a 33.75 pack-year smoking history. She has never used smokeless tobacco. She reports current alcohol use. She reports that she does not use drugs.  Allergies  Allergen Reactions   Honey Bee Venom [Bee Venom] Anaphylaxis   Diazepam Other (See Comments)    REACTION: pt states she gets very angry and abusive verbally on this medication     Family History  Problem Relation Age of Onset   Hyperlipidemia Mother    Arthritis Mother    Diabetes Mother    Heart disease Mother    Colon polyps Mother    Cancer Paternal Grandmother        breast   Hypercholesterolemia Sister    Colon polyps Sister    Breast cancer Sister    Colon cancer Neg Hx    Esophageal cancer Neg Hx    Rectal cancer Neg Hx    Stomach cancer Neg Hx    Pancreatic cancer Neg Hx     Prior to Admission medications   Medication Sig Start Date End Date Taking? Authorizing Provider  acetaminophen (TYLENOL) 650 MG CR tablet Take 650 mg by mouth 3 times/day as needed-between meals & bedtime (for headaches).    [provider]  albuterol (PROVENTIL HFA;VENTOLIN HFA) 108 (90 Base) MCG/ACT inhaler Inhale 2 puffs into the lungs every 4 (four) hours as needed for wheezing or shortness of breath. 07/27/18   Copland, Frederico Hamman, MD  amitriptyline (ELAVIL) 50 MG tablet TAKE 2 TABLETS BY MOUTH DAILY AT BEDTIME. 07/17/21   Copland, Frederico Hamman, MD  Ascorbic Acid (VITAMIN C) 1000 MG tablet Take 1,000 mg by mouth daily.    [provider]  aspirin EC 81 MG tablet Take 81 mg by mouth daily.    [provider]  buPROPion (WELLBUTRIN XL) 150 MG 24 hr tablet TAKE 1 TABLET (150 MG TOTAL) BY MOUTH DAILY. 07/17/21   Copland, Frederico Hamman, MD  calcium carbonate (TUMS - DOSED IN MG ELEMENTAL CALCIUM) 500 MG chewable tablet Chew 2 tablets by mouth daily as needed for indigestion or heartburn.    [provider]  Cholecalciferol (VITAMIN D) 1000 UNITS capsule Take 1,000 Units by mouth daily.     [provider]  clonazePAM (KLONOPIN) 0.5 MG tablet Take 1 tablet by mouth twice daily as needed 05/27/21   Copland, Frederico Hamman, MD  cyclobenzaprine (FLEXERIL) 10 MG tablet TAKE 1 TABLET THREE TIMES DAILY AS NEEDED FOR MUSCLE SPAMS 09/09/21   Copland, Frederico Hamman, MD  ezetimibe (ZETIA) 10 MG tablet Take 1 tablet (10 mg total) by mouth daily. 11/08/20   Arida,  Mertie Clause, MD  FLUoxetine (PROZAC) 40 MG capsule TAKE 1 CAPSULE (40 MG TOTAL) BY MOUTH DAILY. 07/17/21   Copland, Frederico Hamman, MD  gabapentin (NEURONTIN) 100 MG capsule Take 1 capsule (100 mg total) by mouth at bedtime. 09/09/21   Copland, Frederico Hamman, MD  HYDROcodone-acetaminophen (NORCO/VICODIN) 5-325 MG tablet Take 1 tablet by mouth every 8 (eight) hours as needed for moderate pain. 07/21/21 07/21/22  Copland, Frederico Hamman, MD  metoprolol tartrate (LOPRESSOR) 25 MG tablet TAKE 1/2 TABLET TWICE DAILY 11/24/21   Wellington Hampshire, MD  Multiple Vitamin (MULTIVITAMIN) tablet Take 1 tablet by mouth daily.    [provider]  naproxen sodium (ALEVE) 220 MG tablet Take 220 mg by mouth daily.    [provider]  pantoprazole (PROTONIX) 40 MG tablet TAKE 1 TABLET 30 MINUTES PRIOR TO BREAKFAST 11/12/21   Copland, Frederico Hamman, MD  polycarbophil (FIBERCON) 625 MG tablet Take 625 mg by mouth daily as needed. FIBER THERAPY    [provider]  rosuvastatin (CRESTOR) 40 MG tablet TAKE 1 TABLET EVERY DAY 09/24/21   Copland, Frederico Hamman, MD  Simethicone 125 MG CAPS Take 125 mg by mouth daily as needed (gas).    [provider]  vitamin E 180 MG (400 UNITS) capsule Take 400 Units by mouth daily.    [provider]    Physical Exam: Vitals:   11/26/21 1422 11/26/21 1423 11/26/21 1754  BP: (!) 141/73  (!) 167/70  Pulse: (!) 112  99  Resp: 19  18  Temp: 98.7 F (37.1 C)  98.8 F (37.1 C)  TempSrc: Oral  Oral  SpO2: 95%  94%  Weight:  106.6 kg   Height:  5\' 6"  (1.676 m)    Physical Exam Vitals reviewed.  Constitutional:      Appearance:  Normal appearance. She is not ill-appearing.  HENT:     Head: Normocephalic and atraumatic.     Right Ear: External ear normal.     Left Ear: External ear normal.     Nose: Nose normal.     Mouth/Throat:     Mouth: Mucous membranes are moist.  Eyes:     Extraocular Movements: Extraocular movements intact.     Pupils: Pupils are equal, round, and reactive to light.  Cardiovascular:     Rate and Rhythm: Normal rate and regular rhythm.     Pulses: Normal pulses.     Heart sounds: Normal heart sounds.  Pulmonary:     Effort: Pulmonary effort is normal.     Breath sounds: Normal breath sounds.  Abdominal:     General: Bowel sounds are normal. There is no distension.     Palpations: Abdomen is soft. There is no mass.     Tenderness: There is no abdominal tenderness. There is no guarding.     Hernia: No hernia is present.  Musculoskeletal:        General: Swelling, tenderness and deformity present.     Right lower leg: No edema.     Left lower leg: No edema.  Skin:    General: Skin is warm.     Findings: Erythema present.  Neurological:     General: No focal deficit present.     Mental Status: She is alert and oriented to person, place, and time.  Psychiatric:        Mood and Affect: Mood normal.        Behavior: Behavior normal.     Labs on Admission: I have personally reviewed following labs  and imaging studies  No results for input(s): CKTOTAL, CKMB, TROPONINI in the last 72 hours. Lab Results  Component Value Date   WBC 8.9 11/26/2021   HGB 12.0 11/26/2021   HCT 35.6 (L) 11/26/2021   MCV 101.4 (H) 11/26/2021   PLT 201 11/26/2021    Recent Labs  Lab 11/26/21 1623  NA 133*  K 3.4*  CL 99  CO2 26  BUN 11  CREATININE 0.69  CALCIUM 9.4  PROT 7.3  BILITOT 0.8  ALKPHOS 48  ALT 20  AST 24  GLUCOSE 153*   Lab Results  Component Value Date   CHOL 129 06/23/2021   HDL 54.50 06/23/2021   LDLCALC 60 06/23/2021   TRIG 74.0 06/23/2021   No results found for:  DDIMER Invalid input(s): POCBNP   COVID-19 Labs No results for input(s): DDIMER, FERRITIN, LDH, CRP in the last 72 hours. Lab Results  Component Value Date   [place] NEGATIVE 11/11/2020    Radiological Exams on Admission: DG Wrist Complete Right  Result Date: 11/26/2021 CLINICAL DATA:  A 70 year old female presents with pain and swelling of the RIGHT arm and hand since Friday. EXAM: RIGHT WRIST - COMPLETE 3+ VIEW COMPARISON:  Hand evaluation of the same date. Also RIGHT wrist from May of 2021. FINDINGS: Diffuse soft tissue swelling about the wrist best appreciated on the lateral projection. No visible fracture or sign of dislocation. No sign of acute bony destruction. IMPRESSION: Diffuse soft tissue swelling about the right wrist. No acute osseous abnormality or radiographic evidence of bone destruction. Findings are nonspecific. Would however suggest correlation with any history of trauma or signs of infection. Electronically Signed   By: Zetta Bills M.D.   On: 11/26/2021 15:15   CT WRIST RIGHT W CONTRAST  Result Date: 11/26/2021 CLINICAL DATA:  Soft tissue infection suspected, wrist. EXAM: CT OF THE UPPER RIGHT EXTREMITY WITH CONTRAST TECHNIQUE: Multidetector CT imaging of the upper right extremity was performed according to the standard protocol following intravenous contrast administration. CONTRAST:  65mL OMNIPAQUE IOHEXOL 300 MG/ML  SOLN COMPARISON:  Radiograph performed earlier on the same date FINDINGS: Bones/Joint/Cartilage No evidence of fracture or dislocation. No cortical erosion or periosteal reaction concerning for osteomyelitis. Mild degenerative changes of the first carpometacarpal joint. Radiocarpal and intercarpal joints are intact. Ligaments Suboptimally assessed by CT. Muscles and Tendons Normal muscle density in size. No intramuscular collection or abscess. Soft tissues There is a subcutaneous peripherally enhancing fluid collection on the dorsal/ulnar aspect of the  wrist measuring approximately 2.3 x 0.8 x 1.4 cm (series 7 image 155, series 9, image 81). Generalized subcutaneous soft tissue edema and skin thickening of the wrist and forearm. IMPRESSION: 1. Peripherally enhancing fluid collection about the dorsal/ulnar aspect of the wrist measuring at least 2.3 x 0.8 x 1.4 cm concerning for abscess. Clinical correlation is suggested. 2. Generalized subcutaneous soft tissue edema and skin thickening of the wrist and forearm. 3. No acute osseous abnormality. Electronically Signed   By: Keane Police D.O.   On: 11/26/2021 17:46   DG Hand Complete Right  Result Date: 11/26/2021 CLINICAL DATA:  Pain and swelling about the hand in a 70 year old female. EXAM: RIGHT HAND - COMPLETE 3+ VIEW COMPARISON:  Remote imaging from Apr 17, 2020 and prior wrist imaging of the same date. FINDINGS: Soft tissue swelling about the the volar and dorsal aspect of the wrist. No visible fracture. No signs of bony destruction. Degenerative changes are noted about the hand in the first carpometacarpal joint  and distal interphalangeal joints. IMPRESSION: Soft tissue swelling about the volar and dorsal aspect of the wrist. No visible fracture or bony destruction. Correlate with any history of trauma or signs of infection. Degenerative changes about the wrist and hand. Electronically Signed   By: Zetta Bills M.D.   On: 11/26/2021 15:17     Assessment/Plan: Principal Problem:   Abscess, wrist Active Problems:   Controlled type 2 diabetes mellitus with diabetic autonomic neuropathy, without long-term current use of insulin ([facility])   Essential hypertension   COPD with chronic bronchitis ([facility])   CAD (coronary artery disease), native coronary artery  Right wrist abscess: S/P zosyn we will continued with same regimen. Immobilize soft splint as needed,.    Dm II; Ssi/ glycemic protocol.   Htn: Blood pressure (!) 167/70, pulse 99, temperature 98.8 F (37.1 C), temperature source Oral, resp.  rate 18, height 5\' 6"  (1.676 m), weight 106.6 kg, SpO2 94 %. Cont with metoprolol.    COPD with chronic bronchitis: Stable we will continue patient's MDI with albuterol as needed.    CAD; stable Cont rosuvastatin metoprolol and Zetia.  DVT prophylaxis:  Heparin     Code Status:  Full code    Family Communication:  Alaira, Level (Son)  [phone] (Mobile)   Disposition Plan:  Home    Consults called:  Ortho Dr.menz .  Admission status: Inpatient     Para Skeans MD [facility] [phone] How to contact the [facility] Attending or Consulting provider [place] or covering provider during after hours [place], for this patient.    Check the care team in [facility] and look for a) attending/consulting [facility] provider listed and b) the [facility] team listed Log into [email or web address] and use [place]'s universal password to access. If you do not have the password, please contact the hospital operator. Locate the [facility] provider you are looking for under [facility] and page to a number that you can be directly reached. If you still have difficulty reaching the provider, please page the [facility] (Director on Call) for the Hospitalists listed on amion for assistance. [email or web address] Password [facility] 11/26/2021, 8:45 PM

## 2021-11-26 NOTE — Progress Notes (Deleted)
Subjective:   Amanda [name] is a 70 y.o. female who presents for Medicare Annual (Subsequent) preventive examination.  I connected with [facility] today by telephone and verified that I am speaking with the correct person using two identifiers. Location patient: home Location provider: work Persons participating in the virtual visit: patient, Marine scientist.    I discussed the limitations, risks, security and privacy concerns of performing an evaluation and management service by telephone and the availability of in person appointments. I also discussed with the patient that there may be a patient responsible charge related to this service. The patient expressed understanding and verbally consented to this telephonic visit.    Interactive audio and video telecommunications were attempted between this provider and patient, however failed, due to patient having technical difficulties OR patient did not have access to video capability.  We continued and completed visit with audio only.  Some vital signs may be absent or patient reported.   Time Spent with patient on telephone encounter: *** minutes  Review of Systems           Objective:    There were no vitals filed for this visit. There is no height or weight on file to calculate BMI.  Advanced Directives 11/10/2020 05/30/2019 10/18/2018 01/21/2018 12/20/2017 06/02/2017 09/14/2016  Does Patient Have a Medical Advance Directive? Yes Yes Yes Yes Yes Yes Yes  Type of Paramedic of [place];Living will [place];Living will Living will [place];Living will [place];Living will [place];Living will Living will  Does patient want to make changes to medical advance directive? No - Patient declined No - Patient declined No - Patient declined - - - -  Copy of [place] in Chart? No - copy requested No - copy requested - Yes Yes No  - copy requested -  Would patient like information on creating a medical advance directive? No - Patient declined - - - - - -    Current Medications (verified) Outpatient Encounter Medications as of 11/27/2021  Medication Sig   acetaminophen (TYLENOL) 650 MG CR tablet Take 650 mg by mouth 3 times/day as needed-between meals & bedtime (for headaches).   albuterol (PROVENTIL HFA;VENTOLIN HFA) 108 (90 Base) MCG/ACT inhaler Inhale 2 puffs into the lungs every 4 (four) hours as needed for wheezing or shortness of breath.   amitriptyline (ELAVIL) 50 MG tablet TAKE 2 TABLETS BY MOUTH DAILY AT BEDTIME.   Ascorbic Acid (VITAMIN C) 1000 MG tablet Take 1,000 mg by mouth daily.   aspirin EC 81 MG tablet Take 81 mg by mouth daily.   buPROPion (WELLBUTRIN XL) 150 MG 24 hr tablet TAKE 1 TABLET (150 MG TOTAL) BY MOUTH DAILY.   calcium carbonate (TUMS - DOSED IN MG ELEMENTAL CALCIUM) 500 MG chewable tablet Chew 2 tablets by mouth daily as needed for indigestion or heartburn.   Cholecalciferol (VITAMIN D) 1000 UNITS capsule Take 1,000 Units by mouth daily.    clonazePAM (KLONOPIN) 0.5 MG tablet Take 1 tablet by mouth twice daily as needed   cyclobenzaprine (FLEXERIL) 10 MG tablet TAKE 1 TABLET THREE TIMES DAILY AS NEEDED FOR MUSCLE SPAMS   ezetimibe (ZETIA) 10 MG tablet Take 1 tablet (10 mg total) by mouth daily.   FLUoxetine (PROZAC) 40 MG capsule TAKE 1 CAPSULE (40 MG TOTAL) BY MOUTH DAILY.   gabapentin (NEURONTIN) 100 MG capsule Take 1 capsule (100 mg total) by mouth at bedtime.  HYDROcodone-acetaminophen (NORCO/VICODIN) 5-325 MG tablet Take 1 tablet by mouth every 8 (eight) hours as needed for moderate pain.   metoprolol tartrate (LOPRESSOR) 25 MG tablet TAKE 1/2 TABLET TWICE DAILY   Multiple Vitamin (MULTIVITAMIN) tablet Take 1 tablet by mouth daily.   naproxen sodium (ALEVE) 220 MG tablet Take 220 mg by mouth daily.   pantoprazole (PROTONIX) 40 MG tablet TAKE 1 TABLET 30 MINUTES PRIOR TO BREAKFAST    polycarbophil (FIBERCON) 625 MG tablet Take 625 mg by mouth daily as needed. FIBER THERAPY   rosuvastatin (CRESTOR) 40 MG tablet TAKE 1 TABLET EVERY DAY   Simethicone 125 MG CAPS Take 125 mg by mouth daily as needed (gas).   vitamin E 180 MG (400 UNITS) capsule Take 400 Units by mouth daily.   No facility-administered encounter medications on file as of 11/27/2021.    Allergies (verified) Honey bee venom [bee venom] and Diazepam   History: Past Medical History:  Diagnosis Date   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 2013: nl LV sys fxn, mild MR, no evidence of pulm htn; b. TTE 8/17: EF 60-65%, no RWMA, nl LV dia fxn,  mild AS, mod AI   CAD (coronary artery disease), native coronary artery 11/26/2020   Controlled type 2 diabetes mellitus with diabetic autonomic neuropathy, without long-term current use of insulin ([place]) 08/10/2007   Qualifier: Diagnosis of  By: Council Mechanic MD, Hilaria Ota    COPD (chronic obstructive pulmonary disease) ([place]) 10/30/2016   Depression    GAD (generalized anxiety disorder)    GERD (gastroesophageal reflux disease)    Hiatal hernia with gastroesophageal reflux 1997   Hyperlipidemia    Hypertension    Osteoporosis    Tobacco abuse    Past Surgical History:  Procedure Laterality Date   ABDOMINAL HYSTERECTOMY  1993   ANTERIOR CERVICAL DECOMP/DISCECTOMY FUSION N/A 10/26/2018   Procedure: ACDF C4-C5 C5-C6 C6-C7;  Surgeon: Kary Kos, MD;  Location: [facility];  Service: Neurosurgery;  Laterality: N/A;   APPENDECTOMY     BACK SURGERY     BREAST BIOPSY Right 2007   benign   CHOLECYSTECTOMY     COLONOSCOPY     30 years ago was normal per pt.    DILATION AND CURETTAGE OF UTERUS     ESOPHAGEAL DILATION     x 3   fractured leg Right    LEFT HEART CATH AND CORONARY ANGIOGRAPHY N/A 11/11/2020   Procedure: LEFT HEART CATH AND CORONARY ANGIOGRAPHY;  Surgeon: Wellington Hampshire, MD;  Location: [facility];  Service: Cardiovascular;   Laterality: N/A;   OOPHORECTOMY     OVARIAN CYST REMOVAL  1972   SALIVARY GLAND SURGERY     TONSILLECTOMY     4 yoa   TOTAL KNEE ARTHROPLASTY Left 12/24/2015   Procedure: LEFT TOTAL KNEE ARTHROPLASTY;  Surgeon: [name], MD;  Location: [facility];  Service: Orthopedics;  Laterality: Left;   TOTAL KNEE ARTHROPLASTY Right 12/30/2017   Procedure: RIGHT TOTAL KNEE ARTHROPLASTY;  Surgeon: [name], MD;  Location: [facility];  Service: Orthopedics;  Laterality: Right;   WRIST FRACTURE SURGERY  11/2008   Family History  Problem Relation Age of Onset   Hyperlipidemia Mother    Arthritis Mother    Diabetes Mother    Heart disease Mother    Colon polyps Mother    Cancer Paternal Grandmother        breast  Hypercholesterolemia Sister    Colon polyps Sister    Breast cancer Sister    Colon cancer Neg Hx    Esophageal cancer Neg Hx    Rectal cancer Neg Hx    Stomach cancer Neg Hx    Pancreatic cancer Neg Hx    Social History   Socioeconomic History   Marital status: Married    Spouse name: Not on file   Number of children: 2   Years of education: Not on file   Highest education level: Not on file  Occupational History   Occupation: Guy    Employer: [employer]  Tobacco Use   Smoking status: Every Day    Packs/day: 0.75    Years: 45.00    Pack years: 33.75    Types: Cigarettes   Smokeless tobacco: Never   Tobacco comments:    Smoking half PPD but is trying to quit.   Vaping Use   Vaping Use: Never used  Substance and Sexual Activity   Alcohol use: Yes    Alcohol/week: 0.0 standard drinks    Comment: glass of wine twice a month    Drug use: No   Sexual activity: Not Currently  Other Topics Concern   Not on file  Social History Narrative   Not on file   Social Determinants of Health   Financial Resource Strain: Not on file  Food Insecurity: Not on file  Transportation Needs: Not on file  Physical Activity: Not on  file  Stress: Not on file  Social Connections: Not on file    Tobacco Counseling Ready to quit: Not Answered Counseling given: Not Answered Tobacco comments: Smoking half PPD but is trying to quit.    Clinical Intake:                Diabetes:  Is the patient diabetic?  Yes  If diabetic, was a CBG obtained today?  No , visit completed over the phone.  Did the patient bring in their glucometer from home?  No  How often do you monitor your CBG's? ***.   Financial Strains and Diabetes Management:  Are you having any financial strains with the device, your supplies or your medication? {YES/NO:21197}.  Does the patient want to be seen by Chronic Care Management for management of their diabetes?  {YES/NO:21197} Would the patient like to be referred to a Nutritionist or for Diabetic Management?  {YES/NO:21197}  Diabetic Exams:  Diabetic Eye Exam: Completed ***. Overdue for diabetic eye exam. Pt has been advised about the importance in completing this exam. A referral has been placed today. Message sent to referral coordinator for scheduling purposes. Advised pt to expect a call from our office re: appt.  Diabetic Foot Exam: Completed ***. Pt has been advised about the importance in completing this exam. Pt is scheduled for diabetic foot exam on ***.           Activities of Daily Living No flowsheet data found.  Patient Care Team: Owens Loffler, MD as PCP - General (Family Medicine) Debbora Dus, [facility] as Pharmacist (Pharmacist)  Indicate any recent Medical Services you may have received from other than [facility] providers in the past year (date may be approximate).     Assessment:   This is a routine wellness examination for [place].  Hearing/Vision screen No results found.  Dietary issues and exercise activities discussed:     Goals Addressed   None    Depression Screen [facility] 2/9 Scores 08/15/2020 05/30/2019  06/02/2017  PHQ - 2 Score 2 0 0  PHQ- 9 Score 15 0  -    Fall Risk Fall Risk  08/15/2020 05/30/2019 06/02/2017 04/09/2017 09/04/2016  Falls in the past year? 1 0 Yes Yes Yes  Number falls in past yr: 0 - 1 2 or more -  Comment - - pt reports foot slipped while walking on snow; no medical treatment - -  Injury with Fall? 1 - Yes No No    FALL RISK PREVENTION PERTAINING TO THE HOME:  Any stairs in or around the home? {YES/NO:21197} If so, are there any without handrails? {YES/NO:21197} Home free of loose throw rugs in walkways, pet beds, electrical cords, etc? {YES/NO:21197} Adequate lighting in your home to reduce risk of falls? {YES/NO:21197}  ASSISTIVE DEVICES UTILIZED TO PREVENT FALLS:  Life alert? {YES/NO:21197} Use of a cane, walker or w/c? {YES/NO:21197} Grab bars in the bathroom? {YES/NO:21197} Shower chair or bench in shower? {YES/NO:21197} Elevated toilet seat or a handicapped toilet? {YES/NO:21197}  TIMED UP AND GO:  Was the test performed? No , visit completed over the phone.    Cognitive Function: MMSE - Mini Mental State Exam 05/30/2019 06/02/2017 10/25/2015  Orientation to time 5 5 5   Orientation to Place 5 5 5   Registration 3 3 3   Attention/ Calculation 0 0 5  Recall 3 3 3   Language- name 2 objects 0 0 2  Language- repeat 1 1 1   Language- follow 3 step command 0 3 3  Language- read & follow direction 0 0 1  Write a sentence 0 0 1  Copy design 0 0 1  Total score 17 20 30         Immunizations Immunization History  Administered Date(s) Administered   Fluad Quad(high Dose 65+) 08/15/2020   Influenza Split 09/09/2011, 10/10/2012   Influenza Whole 09/20/2006   Influenza, High Dose Seasonal PF 08/15/2019   Influenza,inj,Quad PF,6+ Mos 09/18/2015, 09/16/2016, 09/13/2017   PFIZER(Purple Top)SARS-COV-2 Vaccination 01/25/2020, 02/01/2020   PPD Test 12/27/2015   Pneumococcal Conjugate-13 09/18/2015   Pneumococcal Polysaccharide-23 11/23/2016   Td 12/26/1998, 09/13/2017    TDAP status: Due, Education has been  provided regarding the importance of this vaccine. Advised may receive this vaccine at local pharmacy or Health Dept. Aware to provide a copy of the vaccination record if obtained from local pharmacy or Health Dept. Verbalized acceptance and understanding.  {Flu Vaccine status:2101806}  Pneumococcal vaccine status: Up to date  {Covid-19 vaccine status:2101808}  Qualifies for Shingles Vaccine? Yes   Zostavax completed No   {Shingrix Completed?:2101804}  Screening Tests Health Maintenance  Topic Date Due   Zoster Vaccines- Shingrix (1 of 2) Never done   FOOT EXAM  12/19/2011   DEXA SCAN  Never done   COVID-19 Vaccine (3 - Mixed Product risk series) 02/29/2020   OPHTHALMOLOGY EXAM  07/30/2020   INFLUENZA VACCINE  07/07/2021   MAMMOGRAM  08/31/2021   HEMOGLOBIN A1C  12/24/2021   URINE MICROALBUMIN  06/23/2022   COLONOSCOPY (Pts 45-22yrs Insurance coverage will need to be confirmed)  11/19/2025   TETANUS/TDAP  09/14/2027   Pneumonia Vaccine 66+ Years old  Completed   Hepatitis C Screening  Completed   HPV VACCINES  Aged Out    Health Maintenance  Health Maintenance Due  Topic Date Due   Zoster Vaccines- Shingrix (1 of 2) Never done   FOOT EXAM  12/19/2011   DEXA SCAN  Never done   COVID-19 Vaccine (3 - Mixed Product risk series) 02/29/2020  OPHTHALMOLOGY EXAM  07/30/2020   INFLUENZA VACCINE  07/07/2021   MAMMOGRAM  08/31/2021    Colorectal cancer screening: Type of screening: Colonoscopy. Completed 11/20/15. Repeat every 10 years  {Mammogram status:21018020}  {Bone Density status:21018021}  Lung Cancer Screening: (Low Dose CT Chest recommended if Age 61-80 years, 30 pack-year currently smoking OR have quit w/in 15years.) does qualify.   Lung Cancer Screening Referral: ***  Additional Screening:  Hepatitis C Screening: does qualify; Completed 06/02/17  Vision Screening: Recommended annual ophthalmology exams for early detection of glaucoma and other disorders of  the eye. Is the patient up to date with their annual eye exam?  {YES/NO:21197} Who is the provider or what is the name of the office in which the patient attends annual eye exams? *** If pt is not established with a provider, would they like to be referred to a provider to establish care? {YES/NO:21197}.   Dental Screening: Recommended annual dental exams for proper oral hygiene  Community Resource Referral / Chronic Care Management: CRR required this visit?  {YES/NO:21197}  CCM required this visit?  {YES/NO:21197}     Plan:     I have personally reviewed and noted the following in the patients chart:   Medical and social history Use of alcohol, tobacco or illicit drugs  Current medications and supplements including opioid prescriptions.  Functional ability and status Nutritional status Physical activity Advanced directives List of other physicians Hospitalizations, surgeries, and ER visits in previous 12 months Vitals Screenings to include cognitive, depression, and falls Referrals and appointments  In addition, I have reviewed and discussed with patient certain preventive protocols, quality metrics, and best practice recommendations. A written personalized care plan for preventive services as well as general preventive health recommendations were provided to patient.   Due to this being a telephonic visit, the after visit summary with patients personalized plan was offered to patient via mail or my-chart. ***Patient declined at this time./ Patient would like to access on my-chart/ per request, patient was mailed a copy of AVS./ Patient preferred to pick up at office at next visit.   Loma Messing, LPN   09/12/1218   Nurse Health Advisor  Nurse Notes: none

## 2021-11-27 ENCOUNTER — Ambulatory Visit: Payer: Medicare Other

## 2021-11-27 DIAGNOSIS — W5501XA Bitten by cat, initial encounter: Secondary | ICD-10-CM

## 2021-11-27 DIAGNOSIS — J449 Chronic obstructive pulmonary disease, unspecified: Secondary | ICD-10-CM

## 2021-11-27 DIAGNOSIS — L02419 Cutaneous abscess of limb, unspecified: Secondary | ICD-10-CM | POA: Diagnosis not present

## 2021-11-27 DIAGNOSIS — E1143 Type 2 diabetes mellitus with diabetic autonomic (poly)neuropathy: Secondary | ICD-10-CM | POA: Diagnosis not present

## 2021-11-27 DIAGNOSIS — I10 Essential (primary) hypertension: Secondary | ICD-10-CM

## 2021-11-27 DIAGNOSIS — I2510 Atherosclerotic heart disease of native coronary artery without angina pectoris: Secondary | ICD-10-CM | POA: Diagnosis not present

## 2021-11-27 LAB — GLUCOSE, CAPILLARY
Glucose-Capillary: 138 mg/dL — ABNORMAL HIGH (ref 70–99)
Glucose-Capillary: 145 mg/dL — ABNORMAL HIGH (ref 70–99)
Glucose-Capillary: 145 mg/dL — ABNORMAL HIGH (ref 70–99)
Glucose-Capillary: 85 mg/dL (ref 70–99)

## 2021-11-27 LAB — BLOOD CULTURE ID PANEL (REFLEXED) - BCID2
A.calcoaceticus-baumannii: NOT DETECTED
Bacteroides fragilis: NOT DETECTED
CTX-M ESBL: NOT DETECTED
Candida albicans: NOT DETECTED
Candida auris: NOT DETECTED
Candida glabrata: NOT DETECTED
Candida krusei: NOT DETECTED
Candida parapsilosis: NOT DETECTED
Candida tropicalis: NOT DETECTED
Carbapenem resist OXA 48 LIKE: NOT DETECTED
Carbapenem resistance IMP: NOT DETECTED
Carbapenem resistance KPC: NOT DETECTED
Carbapenem resistance NDM: NOT DETECTED
Carbapenem resistance VIM: NOT DETECTED
Cryptococcus neoformans/gattii: NOT DETECTED
Enterobacter cloacae complex: NOT DETECTED
Enterobacterales: NOT DETECTED
Enterococcus Faecium: NOT DETECTED
Enterococcus faecalis: NOT DETECTED
Escherichia coli: NOT DETECTED
Haemophilus influenzae: NOT DETECTED
Klebsiella aerogenes: NOT DETECTED
Klebsiella oxytoca: NOT DETECTED
Klebsiella pneumoniae: NOT DETECTED
Listeria monocytogenes: NOT DETECTED
Meth resistant mecA/C and MREJ: NOT DETECTED
Methicillin resistance mecA/C: NOT DETECTED
Neisseria meningitidis: NOT DETECTED
Proteus species: NOT DETECTED
Pseudomonas aeruginosa: NOT DETECTED
Salmonella species: NOT DETECTED
Serratia marcescens: NOT DETECTED
Staphylococcus aureus (BCID): NOT DETECTED
Staphylococcus epidermidis: NOT DETECTED
Staphylococcus lugdunensis: NOT DETECTED
Staphylococcus species: NOT DETECTED
Stenotrophomonas maltophilia: NOT DETECTED
Streptococcus agalactiae: NOT DETECTED
Streptococcus pneumoniae: NOT DETECTED
Streptococcus pyogenes: NOT DETECTED
Streptococcus species: NOT DETECTED
Vancomycin resistance: NOT DETECTED

## 2021-11-27 LAB — HEMOGLOBIN A1C
Hgb A1c MFr Bld: 6.4 % — ABNORMAL HIGH (ref 4.8–5.6)
Mean Plasma Glucose: 137 mg/dL

## 2021-11-27 MED ORDER — ACETAMINOPHEN 325 MG PO TABS
650.0000 mg | ORAL_TABLET | Freq: Four times a day (QID) | ORAL | Status: DC | PRN
  Administered 2021-11-27: 16:00:00 650 mg via ORAL
  Filled 2021-11-27: qty 2

## 2021-11-27 MED ORDER — AMITRIPTYLINE HCL 50 MG PO TABS
100.0000 mg | ORAL_TABLET | Freq: Every day | ORAL | Status: DC
  Administered 2021-11-27 – 2021-11-29 (×2): 100 mg via ORAL
  Filled 2021-11-27 (×3): qty 2

## 2021-11-27 MED ORDER — ALBUTEROL SULFATE (2.5 MG/3ML) 0.083% IN NEBU: 3.0000 mL | INHALATION_SOLUTION | RESPIRATORY_TRACT | Status: DC | PRN

## 2021-11-27 MED ORDER — CLONAZEPAM 0.5 MG PO TABS
0.5000 mg | ORAL_TABLET | Freq: Two times a day (BID) | ORAL | Status: DC | PRN
  Administered 2021-11-28 (×2): 0.5 mg via ORAL
  Filled 2021-11-27 (×2): qty 1

## 2021-11-27 MED ORDER — DOXYCYCLINE HYCLATE 100 MG IV SOLR
100.0000 mg | Freq: Two times a day (BID) | INTRAVENOUS | Status: DC
  Administered 2021-11-27 (×2): 100 mg via INTRAVENOUS
  Filled 2021-11-27 (×4): qty 100

## 2021-11-27 MED ORDER — VANCOMYCIN HCL 2000 MG/400ML IV SOLN
2000.0000 mg | Freq: Once | INTRAVENOUS | Status: DC
  Filled 2021-11-27: qty 400

## 2021-11-27 MED ORDER — VANCOMYCIN HCL 1500 MG/300ML IV SOLN: 1500.0000 mg | INTRAVENOUS | Status: DC

## 2021-11-27 NOTE — Consult Note (Signed)
Reason for Consult: Right hand and wrist infection Referring Physician: Dr. [name] is an 70 y.o. female.  HPI: Patient is a 70 year old who was noticed swelling to the hand for the last several weeks and had significant swelling and pain last night when she was brought to the emergency room and found to have abscess on ultrasound by the PA in the ER where bedside incision and drainage was carried out.  Requested cultures also be obtained of that abscess.  She reports pain with any motion of the hand.  She does not know the etiology of this problem.  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 2013: nl LV sys fxn, mild MR, no evidence of pulm htn; b. TTE 8/17: EF 60-65%, no RWMA, nl LV dia fxn,  mild AS, mod AI   CAD (coronary artery disease), native coronary artery 11/26/2020   Controlled type 2 diabetes mellitus with diabetic autonomic neuropathy, without long-term current use of insulin (Amanda [name]) 08/10/2007   Qualifier: Diagnosis of  By: Council Mechanic MD, Hilaria Ota    COPD (chronic obstructive pulmonary disease) ([place]) 10/30/2016   Depression    GAD (generalized anxiety disorder)    GERD (gastroesophageal reflux disease)    Hiatal hernia with gastroesophageal reflux 1997   Hyperlipidemia    Hypertension    Osteoporosis    Tobacco abuse     Past Surgical History:  Procedure Laterality Date   ABDOMINAL HYSTERECTOMY  1993   ANTERIOR CERVICAL DECOMP/DISCECTOMY FUSION N/A 10/26/2018   Procedure: ACDF C4-C5 C5-C6 C6-C7;  Surgeon: Kary Kos, MD;  Location: [place];  Service: Neurosurgery;  Laterality: N/A;   APPENDECTOMY     BACK SURGERY     BREAST BIOPSY Right 2007   benign   CHOLECYSTECTOMY     COLONOSCOPY     30 years ago was normal per pt.    DILATION AND CURETTAGE OF UTERUS     ESOPHAGEAL DILATION     x 3   fractured leg Right    LEFT HEART CATH AND CORONARY ANGIOGRAPHY N/A 11/11/2020   Procedure: LEFT HEART CATH  AND CORONARY ANGIOGRAPHY;  Surgeon: Wellington Hampshire, MD;  Location: [facility];  Service: Cardiovascular;  Laterality: N/A;   OOPHORECTOMY     OVARIAN CYST REMOVAL  1972   SALIVARY GLAND SURGERY     TONSILLECTOMY     4 yoa   TOTAL KNEE ARTHROPLASTY Left 12/24/2015   Procedure: LEFT TOTAL KNEE ARTHROPLASTY;  Surgeon: [name], MD;  Location: [place];  Service: Orthopedics;  Laterality: Left;   TOTAL KNEE ARTHROPLASTY Right 12/30/2017   Procedure: RIGHT TOTAL KNEE ARTHROPLASTY;  Surgeon: [name], MD;  Location: [place];  Service: Orthopedics;  Laterality: Right;   WRIST FRACTURE SURGERY  11/2008    Family History  Problem Relation Age of Onset   Hyperlipidemia Mother    Arthritis Mother    Diabetes Mother    Heart disease Mother    Colon polyps Mother    Cancer Paternal Grandmother        breast   Hypercholesterolemia Sister    Colon polyps Sister    Breast cancer Sister    Colon cancer Neg Hx    Esophageal cancer Neg Hx    Rectal cancer Neg Hx    Stomach cancer Neg Hx    Pancreatic cancer Neg Hx  Social History:  reports that she has been smoking cigarettes. She has a 33.75 pack-year smoking history. She has never used smokeless tobacco. She reports current alcohol use. She reports that she does not use drugs.  Allergies:  Allergies  Allergen Reactions   Honey Bee Venom [Bee Venom] Anaphylaxis   Diazepam Other (See Comments)    REACTION: pt states she gets very angry and abusive verbally on this medication     Medications: I have reviewed the patient's current medications.  Results for orders placed or performed during the hospital encounter of 11/26/21 (from the past 48 hour(s))  Comprehensive metabolic panel     Status: Abnormal   Collection Time: 11/26/21  4:23 PM  Result Value Ref Range   Sodium 133 (L) 135 - 145 mmol/L   Potassium 3.4 (L) 3.5 - 5.1 mmol/L   Chloride 99 98 - 111 mmol/L   CO2 26 22 - 32 mmol/L   Glucose, Bld 153  (H) 70 - 99 mg/dL    Comment: Glucose reference range applies only to samples taken after fasting for at least 8 hours.   BUN 11 8 - 23 mg/dL   Creatinine, Ser 0.69 0.44 - 1.00 mg/dL   Calcium 9.4 8.9 - 10.3 mg/dL   Total Protein 7.3 6.5 - 8.1 g/dL   Albumin 3.7 3.5 - 5.0 g/dL   AST 24 15 - 41 U/L   ALT 20 0 - 44 U/L   Alkaline Phosphatase 48 38 - 126 U/L   Total Bilirubin 0.8 0.3 - 1.2 mg/dL   GFR, Estimated >60 >60 mL/min    Comment: (NOTE) Calculated using the CKD-EPI Creatinine Equation (2021)    Anion gap 8 5 - 15    Comment: Performed at [facility], [address] [place], [place] [address]  CBC with Differential     Status: Abnormal   Collection Time: 11/26/21  4:23 PM  Result Value Ref Range   WBC 8.9 4.0 - 10.5 K/uL   RBC 3.51 (L) 3.87 - 5.11 MIL/uL   Hemoglobin 12.0 12.0 - 15.0 g/dL   HCT 35.6 (L) 36.0 - 46.0 %   MCV 101.4 (H) 80.0 - 100.0 fL   MCH 34.2 (H) 26.0 - 34.0 pg   MCHC 33.7 30.0 - 36.0 g/dL   RDW 11.0 (L) 11.5 - 15.5 %   Platelets 201 150 - 400 K/uL   nRBC 0.0 0.0 - 0.2 %   Neutrophils Relative % 79 %   Neutro Abs 7.0 1.7 - 7.7 K/uL   Lymphocytes Relative 11 %   Lymphs Abs 0.9 0.7 - 4.0 K/uL   Monocytes Relative 10 %   Monocytes Absolute 0.8 0.1 - 1.0 K/uL   Eosinophils Relative 0 %   Eosinophils Absolute 0.0 0.0 - 0.5 K/uL   Basophils Relative 0 %   Basophils Absolute 0.0 0.0 - 0.1 K/uL   Immature Granulocytes 0 %   Abs Immature Granulocytes 0.03 0.00 - 0.07 K/uL    Comment: Performed at [facility], [address] [place], [place] [address]  Lactic acid, plasma     Status: None   Collection Time: 11/26/21  4:23 PM  Result Value Ref Range   Lactic Acid, Venous 0.9 0.5 - 1.9 mmol/L    Comment: Performed at [facility], [address] [place], [place] [address]  Resp Panel by RT-PCR (Flu A&B, Covid) Nasopharyngeal Swab     Status: None   Collection Time: 11/26/21  8:26 PM  Specimen: Nasopharyngeal Swab;  Nasopharyngeal(NP) swabs in vial transport medium  Result Value Ref Range   SARS Coronavirus 2 by RT PCR NEGATIVE NEGATIVE    Comment: (NOTE) SARS-CoV-2 target nucleic acids are NOT DETECTED.  The SARS-CoV-2 RNA is generally detectable in upper respiratory specimens during the acute phase of infection. The lowest concentration of SARS-CoV-2 viral copies this assay can detect is 138 copies/mL. A negative result does not preclude SARS-Cov-2 infection and should not be used as the sole basis for treatment or other patient management decisions. A negative result may occur with  improper specimen collection/handling, submission of specimen other than nasopharyngeal swab, presence of viral mutation(s) within the areas targeted by this assay, and inadequate number of viral copies(<138 copies/mL). A negative result must be combined with clinical observations, patient history, and epidemiological information. The expected result is Negative.  Fact Sheet for Patients:  EntrepreneurPulse.com.au  Fact Sheet for Healthcare Providers:  IncredibleEmployment.be  This test is no t yet approved or cleared by the [place] FDA and  has been authorized for detection and/or diagnosis of SARS-CoV-2 by FDA under an Emergency Use Authorization (EUA). This EUA will remain  in effect (meaning this test can be used) for the duration of the COVID-19 declaration under Section 564(b)(1) of the Act, 21 U.S.C.section 360bbb-3(b)(1), unless the authorization is terminated  or revoked sooner.       Influenza A by PCR NEGATIVE NEGATIVE   Influenza B by PCR NEGATIVE NEGATIVE    Comment: (NOTE) The Xpert Xpress SARS-CoV-2/FLU/RSV plus assay is intended as an aid in the diagnosis of influenza from Nasopharyngeal swab specimens and should not be used as a sole basis for treatment. Nasal washings and aspirates are unacceptable for Xpert Xpress  SARS-CoV-2/FLU/RSV testing.  Fact Sheet for Patients: EntrepreneurPulse.com.au  Fact Sheet for Healthcare Providers: IncredibleEmployment.be  This test is not yet approved or cleared by the [place] FDA and has been authorized for detection and/or diagnosis of SARS-CoV-2 by FDA under an Emergency Use Authorization (EUA). This EUA will remain in effect (meaning this test can be used) for the duration of the COVID-19 declaration under Section 564(b)(1) of the Act, 21 U.S.C. section 360bbb-3(b)(1), unless the authorization is terminated or revoked.  Performed at [facility], [place], [place] [address]     DG Wrist Complete Right  Result Date: 11/26/2021 CLINICAL DATA:  A 70 year old female presents with pain and swelling of the RIGHT arm and hand since Friday. EXAM: RIGHT WRIST - COMPLETE 3+ VIEW COMPARISON:  Hand evaluation of the same date. Also RIGHT wrist from May of 2021. FINDINGS: Diffuse soft tissue swelling about the wrist best appreciated on the lateral projection. No visible fracture or sign of dislocation. No sign of acute bony destruction. IMPRESSION: Diffuse soft tissue swelling about the right wrist. No acute osseous abnormality or radiographic evidence of bone destruction. Findings are nonspecific. Would however suggest correlation with any history of trauma or signs of infection. Electronically Signed   By: Zetta Bills M.D.   On: 11/26/2021 15:15   CT WRIST RIGHT W CONTRAST  Result Date: 11/26/2021 CLINICAL DATA:  Soft tissue infection suspected, wrist. EXAM: CT OF THE UPPER RIGHT EXTREMITY WITH CONTRAST TECHNIQUE: Multidetector CT imaging of the upper right extremity was performed according to the standard protocol following intravenous contrast administration. CONTRAST:  72mL OMNIPAQUE IOHEXOL 300 MG/ML  SOLN COMPARISON:  Radiograph performed earlier on the same date FINDINGS: Bones/Joint/Cartilage No  evidence of fracture or dislocation. No  cortical erosion or periosteal reaction concerning for osteomyelitis. Mild degenerative changes of the first carpometacarpal joint. Radiocarpal and intercarpal joints are intact. Ligaments Suboptimally assessed by CT. Muscles and Tendons Normal muscle density in size. No intramuscular collection or abscess. Soft tissues There is a subcutaneous peripherally enhancing fluid collection on the dorsal/ulnar aspect of the wrist measuring approximately 2.3 x 0.8 x 1.4 cm (series 7 image 155, series 9, image 81). Generalized subcutaneous soft tissue edema and skin thickening of the wrist and forearm. IMPRESSION: 1. Peripherally enhancing fluid collection about the dorsal/ulnar aspect of the wrist measuring at least 2.3 x 0.8 x 1.4 cm concerning for abscess. Clinical correlation is suggested. 2. Generalized subcutaneous soft tissue edema and skin thickening of the wrist and forearm. 3. No acute osseous abnormality. Electronically Signed   By: Keane Police D.O.   On: 11/26/2021 17:46   DG Hand Complete Right  Result Date: 11/26/2021 CLINICAL DATA:  Pain and swelling about the hand in a 70 year old female. EXAM: RIGHT HAND - COMPLETE 3+ VIEW COMPARISON:  Remote imaging from Apr 17, 2020 and prior wrist imaging of the same date. FINDINGS: Soft tissue swelling about the the volar and dorsal aspect of the wrist. No visible fracture. No signs of bony destruction. Degenerative changes are noted about the hand in the first carpometacarpal joint and distal interphalangeal joints. IMPRESSION: Soft tissue swelling about the volar and dorsal aspect of the wrist. No visible fracture or bony destruction. Correlate with any history of trauma or signs of infection. Degenerative changes about the wrist and hand. Electronically Signed   By: Zetta Bills M.D.   On: 11/26/2021 15:17    Review of Systems Blood pressure (!) 108/52, pulse 85, temperature 98.5 F (36.9 C), temperature source Oral,  resp. rate 17, height 5\' 6"  (1.676 m), weight 106.6 kg, SpO2 98 %. Physical Exam She has diffuse swelling of her hand and wrist volar and dorsal.  She does not have severe pain with passive range of motion but just diffuse tenderness throughout the hand.  She does have sensation to her fingertips on palpation.  There is a half a centimeter incision on the dorsum of the wrist on the ulnar side where drainage was carried out last night.  This has minimal drainage now. Assessment/Plan: Abscess to dorsal right wrist.  This has been drained but she has read meaning cellulitis in the hand and wrist that may require additional I&D if it does not resolve.  Amanda [name] 11/27/2021, 7:26 AM

## 2021-11-27 NOTE — TOC Initial Note (Signed)
Transition of Care [facility]) - Initial/Assessment Note    Patient Details  Name: Amanda [name] MRN: [id number] Date of Birth: 1951/03/31  Transition of Care [facility]) CM/SW Contact:    Beverly Sessions, RN Phone Number: 11/27/2021, 10:04 AM  Clinical Narrative:                   Transition of Care [facility]) Screening Note   Patient Details  Name: Amanda [name] Date of Birth: [birth year]   Transition of Care [facility]) CM/SW Contact:    Beverly Sessions, RN Phone Number: 11/27/2021, 10:05 AM    Transition of Care Department [facility]) has reviewed patient and no TOC needs have been identified at this time. We will continue to monitor patient advancement through interdisciplinary progression rounds. If new patient transition needs arise, please place a TOC consult.         Patient Goals and CMS Choice        Expected Discharge Plan and Services                                                Prior Living Arrangements/Services                       Activities of Daily Living Home Assistive Devices/Equipment: None ADL Screening (condition at time of admission) Patient's cognitive ability adequate to safely complete daily activities?: Yes Is the patient deaf or have difficulty hearing?: No Does the patient have difficulty seeing, even when wearing glasses/contacts?: No Does the patient have difficulty concentrating, remembering, or making decisions?: No Patient able to express need for assistance with ADLs?: Yes Does the patient have difficulty dressing or bathing?: No Independently performs ADLs?: Yes (appropriate for developmental age) Does the patient have difficulty walking or climbing stairs?: No Weakness of Legs: Both Weakness of Arms/Hands: Both  Permission Sought/Granted                  Emotional Assessment              Admission diagnosis:  Abscess of wrist [L02.419] Cat bite, initial encounter [W55.01XA] Abscess of skin of  right wrist [L02.413] Patient Active Problem List   Diagnosis Date Noted   Abscess, wrist 11/26/2021   Abscess of wrist 11/26/2021   CAD (coronary artery disease), native coronary artery 11/26/2020   ACS (acute coronary syndrome) ([place]) 11/11/2020   Nonrheumatic aortic valve stenosis    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 sting    COPD with chronic bronchitis ([place]) 10/30/2016   Gastroesophageal reflux disease 09/17/2016   Smoker 05/10/2015   Solitary pulmonary nodule 03/28/2013   Essential hypertension    Vitamin D deficiency 05/29/2009   Pure hypercholesterolemia 12/07/2007   Major depressive disorder, recurrent episode, in partial remission ([place]) 12/07/2007   Controlled type 2 diabetes mellitus with diabetic autonomic neuropathy, without long-term current use of insulin ([place]) 08/10/2007   PCP:  Owens Loffler, MD Pharmacy:   [facility] [address], [place] - [place] [address] [place] [address] Phone: [phone] Fax: [facility] [place], [place] [address] Phone: [phone] Fax: [phone]     Social Determinants of Health (SDOH) Interventions    Readmission Risk Interventions No flowsheet data found.

## 2021-11-27 NOTE — Plan of Care (Signed)

## 2021-11-27 NOTE — Progress Notes (Signed)
PHARMACY - PHYSICIAN COMMUNICATION CRITICAL VALUE ALERT - BLOOD CULTURE IDENTIFICATION (BCID)  Amanda [name] is an 70 y.o. female who presented to [facility] on 11/26/2021 with a chief complaint of right hand and wrist infection after being scratched / bitten by her cat  Assessment:  2/4 bottles GNR (different sets). BCID did not detect an organism. Suspect right wrist abscess as source.   Name of physician contacted: Dr. Ouida Sills  Current antibiotics: Clindamycin + Zosyn  Changes to prescribed antibiotics recommended:  --Recommend continuing Zosyn --Changing clindamycin to doxycycline  Results for orders placed or performed during the hospital encounter of 11/26/21  Blood Culture ID Panel (Reflexed) (Collected: 11/26/2021  4:23 PM)  Result Value Ref Range   Enterococcus faecalis NOT DETECTED NOT DETECTED   Enterococcus Faecium NOT DETECTED NOT DETECTED   Listeria monocytogenes NOT DETECTED NOT DETECTED   Staphylococcus species NOT DETECTED NOT DETECTED   Staphylococcus aureus (BCID) NOT DETECTED NOT DETECTED   Staphylococcus epidermidis NOT DETECTED NOT DETECTED   Staphylococcus lugdunensis NOT DETECTED NOT DETECTED   Streptococcus species NOT DETECTED NOT DETECTED   Streptococcus agalactiae NOT DETECTED NOT DETECTED   Streptococcus pneumoniae NOT DETECTED NOT DETECTED   Streptococcus pyogenes NOT DETECTED NOT DETECTED   A.calcoaceticus-baumannii NOT DETECTED NOT DETECTED   Bacteroides fragilis NOT DETECTED NOT DETECTED   Enterobacterales NOT DETECTED NOT DETECTED   Enterobacter cloacae complex NOT DETECTED NOT DETECTED   Escherichia coli NOT DETECTED NOT DETECTED   Klebsiella aerogenes NOT DETECTED NOT DETECTED   Klebsiella oxytoca NOT DETECTED NOT DETECTED   Klebsiella pneumoniae NOT DETECTED NOT DETECTED   Proteus species NOT DETECTED NOT DETECTED   Salmonella species NOT DETECTED NOT DETECTED   Serratia marcescens NOT DETECTED NOT DETECTED   Haemophilus influenzae NOT  DETECTED NOT DETECTED   Neisseria meningitidis NOT DETECTED NOT DETECTED   Pseudomonas aeruginosa NOT DETECTED NOT DETECTED   Stenotrophomonas maltophilia NOT DETECTED NOT DETECTED   Candida albicans NOT DETECTED NOT DETECTED   Candida auris NOT DETECTED NOT DETECTED   Candida glabrata NOT DETECTED NOT DETECTED   Candida krusei NOT DETECTED NOT DETECTED   Candida parapsilosis NOT DETECTED NOT DETECTED   Candida tropicalis NOT DETECTED NOT DETECTED   Cryptococcus neoformans/gattii NOT DETECTED NOT DETECTED   CTX-M ESBL NOT DETECTED NOT DETECTED   Carbapenem resistance IMP NOT DETECTED NOT DETECTED   Carbapenem resistance KPC NOT DETECTED NOT DETECTED   Methicillin resistance mecA/C NOT DETECTED NOT DETECTED   Meth resistant mecA/C and MREJ NOT DETECTED NOT DETECTED   Carbapenem resistance NDM NOT DETECTED NOT DETECTED   Carbapenem resist OXA 48 LIKE NOT DETECTED NOT DETECTED   Vancomycin resistance NOT DETECTED NOT DETECTED   Carbapenem resistance VIM NOT DETECTED NOT DETECTED    [name] 11/27/2021  9:38 AM

## 2021-11-27 NOTE — Progress Notes (Signed)
PROGRESS NOTE  [name]    DOB: 08/04/1951, 70 y.o.  DQQ:[id number]  PCP: Owens Loffler, MD   Code Status: Full Code   DOA: 11/26/2021   LOS: 1  Brief Narrative of Current Hospitalization  Amanda [name] is a 70 y.o. female with a PMH significant for HTN, COPD, CAD, type II DM. They presented from home to the ED on 11/26/2021 with right hand pain and swelling x 7 days. In the ED, it was found that they had abscess and right hand with surrounding cellulitis thought to be due to bite/scratch marks from cat. They were treated with IV Zosyn and clindamycin.  Patient was admitted to medicine service for further workup and management of abscess and cellulitis as outlined in detail below.  11/27/21 -stable, improved  Assessment & Plan  Principal Problem:   Abscess, wrist Active Problems:   Controlled type 2 diabetes mellitus with diabetic autonomic neuropathy, without long-term current use of insulin (HCC)   Essential hypertension   COPD with chronic bronchitis (HCC)   CAD (coronary artery disease), native coronary artery   Abscess of wrist  Right hand abscess from cat bite-s/p I&D.  Continues to have significant swelling, pain, decreased use of right hand secondary to pain.  Denies any purulent drainage from incision site.  Patient has been afebrile since admission.  Blood cultures growing GNR.  -General surgery following, appreciate recommendations -Continue IV Zosyn (12/21- -Change clindamycin to doxycycline IV (12/22- - Analgesia PRN  Type II DM-A1c 6.5 recently, admission labs are pending. - sSSI  HTN   CAD   HLD-blood pressure elevated on admission which is improved to normotensive -Continue home metoprolol, rosuvastatin  Depression-chronic, stable -Continue home bupropion, fluoxetine  GERD -Continue Protonix  DVT prophylaxis: heparin injection 5,000 Units Start: 11/26/21 2200 SCDs Start: 11/26/21 2100   Diet:  Diet Orders (From admission, onward)      Start     Ordered   11/27/21 0643  Diet Carb Modified Fluid consistency: Thin; Room service appropriate? Yes  Diet effective now       Question Answer Comment  Diet-HS Snack? Nothing   Calorie Level Medium 1600-2000   Fluid consistency: Thin   Room service appropriate? Yes      11/27/21 0642            Subjective 11/27/21    Pt reports continued pain and swelling of right hand which is limiting her mobility and function of that hand.  She does endorse that it is improved from admission.  Disposition Plan & Communication  Patient status: Inpatient  Admitted From: Home Disposition: Home Anticipated discharge date: 12/24  Family Communication: none  Consults, Procedures, Significant Events  Consultants:  none  Procedures/significant events:  None  Antimicrobials:  Anti-infectives (From admission, onward)    Start     Dose/Rate Route Frequency Ordered Stop   11/27/21 2300  piperacillin-tazobactam (ZOSYN) IVPB 3.375 g        3.375 g 12.5 mL/hr over 240 Minutes Intravenous Every 8 hours 11/26/21 2207     11/26/21 2300  clindamycin (CLEOCIN) IVPB 900 mg        900 mg 100 mL/hr over 30 Minutes Intravenous Every 8 hours 11/26/21 2137 11/29/21 2159   11/26/21 2200  piperacillin-tazobactam (ZOSYN) IVPB 3.375 g  Status:  Discontinued        3.375 g 100 mL/hr over 30 Minutes Intravenous Every 8 hours 11/26/21 2137 11/26/21 2207   11/26/21 1630  piperacillin-tazobactam (ZOSYN) IVPB 3.375 g  3.375 g 100 mL/hr over 30 Minutes Intravenous  Once 11/26/21 1617 11/26/21 1658       Objective   Vitals:   11/26/21 1754 11/27/21 0000 11/27/21 0100 11/27/21 0354  BP: (!) 167/70 (!) 158/74 (!) 141/66 (!) 108/52  Pulse: 99 (!) 113 97 85  Resp: 18 (!) 23 (!) 21 17  Temp: 98.8 F (37.1 C) 98.6 F (37 C)  98.5 F (36.9 C)  TempSrc: Oral Oral  Oral  SpO2: 94% 90% 93% 98%  Weight:      Height:        Intake/Output Summary (Last 24 hours) at 11/27/2021 0751 Last data filed  at 11/27/2021 0234 Gross per 24 hour  Intake 111.38 ml  Output --  Net 111.38 ml   Filed Weights   11/26/21 1423  Weight: 106.6 kg    Patient BMI: Body mass index is 37.93 kg/m.   Physical Exam:  General: awake, alert, NAD Respiratory: normal respiratory effort. Cardiovascular: normal S1/S2, RRR, no JVD, murmurs, quick capillary refill  Nervous: A&O x3. no gross focal neurologic deficits, normal speech Extremities: Right arm nonpitting edema to elbow.  Positive for axillary lymphadenopathy which is tender.  Please see clinical image for further detail. Skin: dry, intact, normal temperature, normal color. No rashes, lesions or ulcers on exposed skin Psychiatry: normal mood, congruent affect    Labs   I have personally reviewed following labs and imaging studies Admission on 11/26/2021  Component Date Value Ref Range Status   Sodium 11/26/2021 133 (L)  135 - 145 mmol/L Final   Potassium 11/26/2021 3.4 (L)  3.5 - 5.1 mmol/L Final   Chloride 11/26/2021 99  98 - 111 mmol/L Final   CO2 11/26/2021 26  22 - 32 mmol/L Final   Glucose, Bld 11/26/2021 153 (H)  70 - 99 mg/dL Final   BUN 11/26/2021 11  8 - 23 mg/dL Final   Creatinine, Ser 11/26/2021 0.69  0.44 - 1.00 mg/dL Final   Calcium 11/26/2021 9.4  8.9 - 10.3 mg/dL Final   Total Protein 11/26/2021 7.3  6.5 - 8.1 g/dL Final   Albumin 11/26/2021 3.7  3.5 - 5.0 g/dL Final   AST 11/26/2021 24  15 - 41 U/L Final   ALT 11/26/2021 20  0 - 44 U/L Final   Alkaline Phosphatase 11/26/2021 48  38 - 126 U/L Final   Total Bilirubin 11/26/2021 0.8  0.3 - 1.2 mg/dL Final   GFR, Estimated 11/26/2021 >60  >60 mL/min Final   Anion gap 11/26/2021 8  5 - 15 Final   WBC 11/26/2021 8.9  4.0 - 10.5 K/uL Final   RBC 11/26/2021 3.51 (L)  3.87 - 5.11 MIL/uL Final   Hemoglobin 11/26/2021 12.0  12.0 - 15.0 g/dL Final   HCT 11/26/2021 35.6 (L)  36.0 - 46.0 % Final   MCV 11/26/2021 101.4 (H)  80.0 - 100.0 fL Final   MCH 11/26/2021 34.2 (H)  26.0 - 34.0 pg  Final   MCHC 11/26/2021 33.7  30.0 - 36.0 g/dL Final   RDW 11/26/2021 11.0 (L)  11.5 - 15.5 % Final   Platelets 11/26/2021 201  150 - 400 K/uL Final   nRBC 11/26/2021 0.0  0.0 - 0.2 % Final   Neutrophils Relative % 11/26/2021 79  % Final   Neutro Abs 11/26/2021 7.0  1.7 - 7.7 K/uL Final   Lymphocytes Relative 11/26/2021 11  % Final   Lymphs Abs 11/26/2021 0.9  0.7 - 4.0 K/uL Final  Monocytes Relative 11/26/2021 10  % Final   Monocytes Absolute 11/26/2021 0.8  0.1 - 1.0 K/uL Final   Eosinophils Relative 11/26/2021 0  % Final   Eosinophils Absolute 11/26/2021 0.0  0.0 - 0.5 K/uL Final   Basophils Relative 11/26/2021 0  % Final   Basophils Absolute 11/26/2021 0.0  0.0 - 0.1 K/uL Final   Immature Granulocytes 11/26/2021 0  % Final   Abs Immature Granulocytes 11/26/2021 0.03  0.00 - 0.07 K/uL Final   Lactic Acid, Venous 11/26/2021 0.9  0.5 - 1.9 mmol/L Final   SARS Coronavirus 2 by RT PCR 11/26/2021 NEGATIVE  NEGATIVE Final   Influenza A by PCR 11/26/2021 NEGATIVE  NEGATIVE Final   Influenza B by PCR 11/26/2021 NEGATIVE  NEGATIVE Final    Imaging Studies  DG Wrist Complete Right  Result Date: 11/26/2021 CLINICAL DATA:  A 70 year old female presents with pain and swelling of the RIGHT arm and hand since Friday. EXAM: RIGHT WRIST - COMPLETE 3+ VIEW COMPARISON:  Hand evaluation of the same date. Also RIGHT wrist from May of 2021. FINDINGS: Diffuse soft tissue swelling about the wrist best appreciated on the lateral projection. No visible fracture or sign of dislocation. No sign of acute bony destruction. IMPRESSION: Diffuse soft tissue swelling about the right wrist. No acute osseous abnormality or radiographic evidence of bone destruction. Findings are nonspecific. Would however suggest correlation with any history of trauma or signs of infection. Electronically Signed   By: Zetta Bills M.D.   On: 11/26/2021 15:15   CT WRIST RIGHT W CONTRAST  Result Date: 11/26/2021 CLINICAL DATA:  Soft  tissue infection suspected, wrist. EXAM: CT OF THE UPPER RIGHT EXTREMITY WITH CONTRAST TECHNIQUE: Multidetector CT imaging of the upper right extremity was performed according to the standard protocol following intravenous contrast administration. CONTRAST:  64mL OMNIPAQUE IOHEXOL 300 MG/ML  SOLN COMPARISON:  Radiograph performed earlier on the same date FINDINGS: Bones/Joint/Cartilage No evidence of fracture or dislocation. No cortical erosion or periosteal reaction concerning for osteomyelitis. Mild degenerative changes of the first carpometacarpal joint. Radiocarpal and intercarpal joints are intact. Ligaments Suboptimally assessed by CT. Muscles and Tendons Normal muscle density in size. No intramuscular collection or abscess. Soft tissues There is a subcutaneous peripherally enhancing fluid collection on the dorsal/ulnar aspect of the wrist measuring approximately 2.3 x 0.8 x 1.4 cm (series 7 image 155, series 9, image 81). Generalized subcutaneous soft tissue edema and skin thickening of the wrist and forearm. IMPRESSION: 1. Peripherally enhancing fluid collection about the dorsal/ulnar aspect of the wrist measuring at least 2.3 x 0.8 x 1.4 cm concerning for abscess. Clinical correlation is suggested. 2. Generalized subcutaneous soft tissue edema and skin thickening of the wrist and forearm. 3. No acute osseous abnormality. Electronically Signed   By: Keane Police D.O.   On: 11/26/2021 17:46   DG Hand Complete Right  Result Date: 11/26/2021 CLINICAL DATA:  Pain and swelling about the hand in a 70 year old female. EXAM: RIGHT HAND - COMPLETE 3+ VIEW COMPARISON:  Remote imaging from Apr 17, 2020 and prior wrist imaging of the same date. FINDINGS: Soft tissue swelling about the the volar and dorsal aspect of the wrist. No visible fracture. No signs of bony destruction. Degenerative changes are noted about the hand in the first carpometacarpal joint and distal interphalangeal joints. IMPRESSION: Soft tissue  swelling about the volar and dorsal aspect of the wrist. No visible fracture or bony destruction. Correlate with any history of trauma or signs of infection.  Degenerative changes about the wrist and hand. Electronically Signed   By: Zetta Bills M.D.   On: 11/26/2021 15:17    Medications   Scheduled Meds:  aspirin EC  81 mg Oral QHS   buPROPion  150 mg Oral QHS   ezetimibe  10 mg Oral QHS   FLUoxetine  40 mg Oral Daily   gabapentin  100 mg Oral QHS   heparin  5,000 Units Subcutaneous Q8H   insulin aspart  0-9 Units Subcutaneous TID WC   metoprolol tartrate  12.5 mg Oral BID   pantoprazole  40 mg Oral Daily   rosuvastatin  40 mg Oral QHS   No recently discontinued medications to reconcile  LOS: 1 day   Time spent: >53min  Shaketta Rill L Vincenza Dail, DO [facility] 11/27/2021, 7:51 AM   Available by Epic secure chat 7AM-7PM. If 7PM-7AM, please contact night-coverage Refer to [email or web address] to contact the [facility] Attending or Consulting provider for this pt

## 2021-11-28 DIAGNOSIS — L02419 Cutaneous abscess of limb, unspecified: Secondary | ICD-10-CM | POA: Diagnosis not present

## 2021-11-28 DIAGNOSIS — I2510 Atherosclerotic heart disease of native coronary artery without angina pectoris: Secondary | ICD-10-CM | POA: Diagnosis not present

## 2021-11-28 DIAGNOSIS — W5501XA Bitten by cat, initial encounter: Secondary | ICD-10-CM | POA: Diagnosis not present

## 2021-11-28 DIAGNOSIS — E1143 Type 2 diabetes mellitus with diabetic autonomic (poly)neuropathy: Secondary | ICD-10-CM | POA: Diagnosis not present

## 2021-11-28 DIAGNOSIS — A280 Pasteurellosis: Secondary | ICD-10-CM

## 2021-11-28 LAB — CREATININE, SERUM
Creatinine, Ser: 0.57 mg/dL (ref 0.44–1.00)
GFR, Estimated: >60 mL/min (ref >60)

## 2021-11-28 LAB — CBC
HCT: 32.3 % — ABNORMAL LOW (ref 36.0–46.0)
Hemoglobin: 10.9 g/dL — ABNORMAL LOW (ref 12.0–15.0)
MCH: 33.4 pg (ref 26.0–34.0)
MCHC: 33.7 g/dL (ref 30.0–36.0)
MCV: 99.1 fL (ref 80.0–100.0)
Platelets: 218 10*3/uL (ref 150–400)
RBC: 3.26 MIL/uL — ABNORMAL LOW (ref 3.87–5.11)
RDW: 10.9 % — ABNORMAL LOW (ref 11.5–15.5)
WBC: 6.6 10*3/uL (ref 4.0–10.5)
nRBC: 0 % (ref 0.0–0.2)

## 2021-11-28 LAB — GLUCOSE, CAPILLARY: Glucose-Capillary: 122 mg/dL — ABNORMAL HIGH (ref 70–99)

## 2021-11-28 LAB — AEROBIC CULTURE W GRAM STAIN (SUPERFICIAL SPECIMEN)

## 2021-11-28 MED ORDER — ENOXAPARIN SODIUM 60 MG/0.6ML IJ SOSY
0.5000 mg/kg | PREFILLED_SYRINGE | INTRAMUSCULAR | Status: DC
  Administered 2021-11-28 – 2021-11-29 (×2): 52.5 mg via SUBCUTANEOUS
  Filled 2021-11-28 (×2): qty 0.6

## 2021-11-28 MED ORDER — SODIUM CHLORIDE 0.9 % IV SOLN
3.0000 g | Freq: Four times a day (QID) | INTRAVENOUS | Status: DC
  Administered 2021-11-28 – 2021-11-29 (×4): 3 g via INTRAVENOUS
  Filled 2021-11-28: qty 8
  Filled 2021-11-28 (×4): qty 3
  Filled 2021-11-28 (×3): qty 8

## 2021-11-28 MED ORDER — MORPHINE SULFATE (PF) 2 MG/ML IV SOLN
2.0000 mg | Freq: Once | INTRAVENOUS | Status: AC
  Administered 2021-11-28: 01:00:00 2 mg via INTRAVENOUS
  Filled 2021-11-28: qty 1

## 2021-11-28 NOTE — Care Management Important Message (Signed)
Important Message  Patient Details  Name: Amanda [name] MRN: [id number] Date of Birth: 1951-03-27   Medicare Important Message Given:  Yes     Juliann Pulse A Almas Rake 11/28/2021, 2:10 PM

## 2021-11-28 NOTE — Progress Notes (Signed)
PROGRESS NOTE  Amanda [name]    DOB: [birth year], 70 y.o.  KXF:[id number]  PCP: Owens Loffler, MD   Code Status: Full Code   DOA: 11/26/2021   LOS: 2  Brief Narrative of Current Hospitalization  Amanda [name] is a 70 y.o. female with a PMH significant for HTN, COPD, CAD, type II DM. They presented from home to the ED on 11/26/2021 with right hand pain and swelling x 7 days. In the ED, it was found that they had abscess and right hand with surrounding cellulitis thought to be due to bite/scratch marks from cat. They were treated with IV Zosyn and clindamycin.  Patient was admitted to medicine service for further workup and management of abscess and cellulitis as outlined in detail below.  11/28/21 -stable, improved  Assessment & Plan  Principal Problem:   Abscess, wrist Active Problems:   Controlled type 2 diabetes mellitus with diabetic autonomic neuropathy, without long-term current use of insulin (HCC)   Essential hypertension   COPD with chronic bronchitis (HCC)   CAD (coronary artery disease), native coronary artery   Abscess of wrist   Cat bite  Right hand abscess from cat bite-s/p I&D.  Continues to have significant swelling, pain, decreased use of right hand secondary to pain.  Denies any purulent drainage from incision site.  Patient has been afebrile since admission.  Blood cultures growing pasteurella multocida. -General surgery following, appreciate recommendations - IV Zosyn (12/21-12/23) -Change clindamycin to doxycycline IV (12/22-12/23) - started unasyn (12/23- - Analgesia PRN - OT consult to start tomorrow  Type II DM-A1c on admission is 6.4 which is well below goal of 8 for age. - discontinue sSSI  HTN   CAD   HLD-blood pressure elevated on admission which is improved to normotensive -Continue home metoprolol, rosuvastatin  Depression-chronic, stable -Continue home bupropion, fluoxetine, amitriptyline  GERD -Continue Protonix  DVT prophylaxis:  heparin injection 5,000 Units Start: 11/26/21 2200 SCDs Start: 11/26/21 2100   Diet:  Diet Orders (From admission, onward)     Start     Ordered   11/27/21 0643  Diet Carb Modified Fluid consistency: Thin; Room service appropriate? Yes  Diet effective now       Question Answer Comment  Diet-HS Snack? Nothing   Calorie Level Medium 1600-2000   Fluid consistency: Thin   Room service appropriate? Yes      11/27/21 0642            Subjective 11/28/21    Pt reports stable pain/swelling.   Disposition Plan & Communication  Patient status: Inpatient  Admitted From: Home Disposition: Home Anticipated discharge date: 12/25  Family Communication: none  Consults, Procedures, Significant Events  Consultants:  none  Procedures/significant events:  I&D 12/20  Antimicrobials:  Anti-infectives (From admission, onward)    Start     Dose/Rate Route Frequency Ordered Stop   11/28/21 1000  vancomycin (VANCOREADY) IVPB 1500 mg/300 mL  Status:  Discontinued        1,500 mg 150 mL/hr over 120 Minutes Intravenous Every 24 hours 11/27/21 1011 11/27/21 1040   11/27/21 2300  piperacillin-tazobactam (ZOSYN) IVPB 3.375 g        3.375 g 12.5 mL/hr over 240 Minutes Intravenous Every 8 hours 11/26/21 2207     11/27/21 1130  doxycycline (VIBRAMYCIN) 100 mg in sodium chloride 0.9 % 250 mL IVPB        100 mg 125 mL/hr over 120 Minutes Intravenous 2 times daily 11/27/21 1041  11/27/21 1100  vancomycin (VANCOREADY) IVPB 2000 mg/400 mL  Status:  Discontinued        2,000 mg 200 mL/hr over 120 Minutes Intravenous  Once 11/27/21 1010 11/27/21 1040   11/26/21 2300  clindamycin (CLEOCIN) IVPB 900 mg  Status:  Discontinued        900 mg 100 mL/hr over 30 Minutes Intravenous Every 8 hours 11/26/21 2137 11/27/21 1009   11/26/21 2200  piperacillin-tazobactam (ZOSYN) IVPB 3.375 g  Status:  Discontinued        3.375 g 100 mL/hr over 30 Minutes Intravenous Every 8 hours 11/26/21 2137 11/26/21 2207    11/26/21 1630  piperacillin-tazobactam (ZOSYN) IVPB 3.375 g        3.375 g 100 mL/hr over 30 Minutes Intravenous  Once 11/26/21 1617 11/26/21 1658       Objective   Vitals:   11/27/21 0755 11/27/21 1511 11/27/21 1949 11/28/21 0505  BP: 116/73 129/72 (!) 135/56 (!) 141/55  Pulse: 80 81 81 89  Resp: 18 18 18 12   Temp: 98.4 F (36.9 C) 98.5 F (36.9 C) 98 F (36.7 C) 99.3 F (37.4 C)  TempSrc:   Oral Oral  SpO2: 93% 96% 98% 92%  Weight:      Height:        Intake/Output Summary (Last 24 hours) at 11/28/2021 0707 Last data filed at 11/27/2021 1850 Gross per 24 hour  Intake 1090 ml  Output --  Net 1090 ml    Filed Weights   11/26/21 1423  Weight: 106.6 kg    Patient BMI: Body mass index is 37.93 kg/m.   Physical Exam:  General: awake, alert, NAD Respiratory: normal respiratory effort. Cardiovascular: normal S1/S2, RRR, no JVD, murmurs, quick capillary refill  Nervous: A&O x3. no gross focal neurologic deficits, normal speech Extremities: Right arm nonpitting edema to elbow.  Positive for axillary lymphadenopathy which is tender.  Generalized swelling is mildly improved from yesterday. Hand remains very warm to touch. Please see clinical image for further detail. Skin: dry, intact, normal temperature, normal color. No rashes, lesions or ulcers on exposed skin Psychiatry: normal mood, congruent affect   Labs   I have personally reviewed following labs and imaging studies Admission on 11/26/2021  Component Date Value Ref Range Status   Sodium 11/26/2021 133 (L)  135 - 145 mmol/L Final   Potassium 11/26/2021 3.4 (L)  3.5 - 5.1 mmol/L Final   Chloride 11/26/2021 99  98 - 111 mmol/L Final   CO2 11/26/2021 26  22 - 32 mmol/L Final   Glucose, Bld 11/26/2021 153 (H)  70 - 99 mg/dL Final   BUN 11/26/2021 11  8 - 23 mg/dL Final   Creatinine, Ser 11/26/2021 0.69  0.44 - 1.00 mg/dL Final   Calcium 11/26/2021 9.4  8.9 - 10.3 mg/dL Final   Total Protein 11/26/2021 7.3  6.5 -  8.1 g/dL Final   Albumin 11/26/2021 3.7  3.5 - 5.0 g/dL Final   AST 11/26/2021 24  15 - 41 U/L Final   ALT 11/26/2021 20  0 - 44 U/L Final   Alkaline Phosphatase 11/26/2021 48  38 - 126 U/L Final   Total Bilirubin 11/26/2021 0.8  0.3 - 1.2 mg/dL Final   GFR, Estimated 11/26/2021 >60  >60 mL/min Final   Anion gap 11/26/2021 8  5 - 15 Final   WBC 11/26/2021 8.9  4.0 - 10.5 K/uL Final   RBC 11/26/2021 3.51 (L)  3.87 - 5.11 MIL/uL Final   Hemoglobin  11/26/2021 12.0  12.0 - 15.0 g/dL Final   HCT 11/26/2021 35.6 (L)  36.0 - 46.0 % Final   MCV 11/26/2021 101.4 (H)  80.0 - 100.0 fL Final   MCH 11/26/2021 34.2 (H)  26.0 - 34.0 pg Final   MCHC 11/26/2021 33.7  30.0 - 36.0 g/dL Final   RDW 11/26/2021 11.0 (L)  11.5 - 15.5 % Final   Platelets 11/26/2021 201  150 - 400 K/uL Final   nRBC 11/26/2021 0.0  0.0 - 0.2 % Final   Neutrophils Relative % 11/26/2021 79  % Final   Neutro Abs 11/26/2021 7.0  1.7 - 7.7 K/uL Final   Lymphocytes Relative 11/26/2021 11  % Final   Lymphs Abs 11/26/2021 0.9  0.7 - 4.0 K/uL Final   Monocytes Relative 11/26/2021 10  % Final   Monocytes Absolute 11/26/2021 0.8  0.1 - 1.0 K/uL Final   Eosinophils Relative 11/26/2021 0  % Final   Eosinophils Absolute 11/26/2021 0.0  0.0 - 0.5 K/uL Final   Basophils Relative 11/26/2021 0  % Final   Basophils Absolute 11/26/2021 0.0  0.0 - 0.1 K/uL Final   Immature Granulocytes 11/26/2021 0  % Final   Abs Immature Granulocytes 11/26/2021 0.03  0.00 - 0.07 K/uL Final   Lactic Acid, Venous 11/26/2021 0.9  0.5 - 1.9 mmol/L Final   Specimen Description 11/26/2021 BLOOD BLLA   Final   Special Requests 11/26/2021 BOTTLES DRAWN AEROBIC AND ANAEROBIC [place]   Final   Culture  Setup Time 11/26/2021    Final                   Value:GRAM NEGATIVE RODS AEROBIC BOTTLE ONLY CRITICAL VALUE NOTED.  VALUE IS CONSISTENT WITH PREVIOUSLY REPORTED AND CALLED VALUE. Performed at [facility], [address] [place], [place] [address]    Culture  11/26/2021 [facility] NEGATIVE RODS   Final   Report Status 11/26/2021 PENDING   Incomplete   Specimen Description 11/26/2021 BLOOD LAC   Final   Special Requests 11/26/2021 BOTTLES DRAWN AEROBIC AND ANAEROBIC BCAV   Final   Culture  Setup Time 11/26/2021    Final                   Value:GRAM NEGATIVE RODS AEROBIC BOTTLE ONLY Organism ID to follow CRITICAL RESULT CALLED TO, READ BACK BY AND VERIFIED WITHNilsa Nutting [facility] 5053 11/27/21 HNM Performed at [facility], [address], [place], [place] [address]    Culture 11/26/2021 [facility] NEGATIVE RODS   Final   Report Status 11/26/2021 PENDING   Incomplete   Specimen Description 11/26/2021    Final                   Value:ABSCESS Performed at [facility], [address] [place], [place] [address]    Special Requests 11/26/2021    Final                   Value:NONE Performed at [facility], [address] [place], [place] [address]    Gram Stain 11/26/2021    Final                   Value:ABUNDANT WBC PRESENT, PREDOMINANTLY PMN NO ORGANISMS SEEN Performed at [facility] [address] [place], [place] [address]    Culture 11/26/2021 PENDING   Incomplete   Report Status 11/26/2021 PENDING   Incomplete   SARS Coronavirus 2 by  RT PCR 11/26/2021 NEGATIVE  NEGATIVE Final   Influenza A by PCR 11/26/2021 NEGATIVE  NEGATIVE Final   Influenza B by PCR 11/26/2021 NEGATIVE  NEGATIVE Final   Hgb A1c MFr Bld 11/26/2021 6.4 (H)  4.8 - 5.6 % Final   Mean Plasma Glucose 11/26/2021 137  mg/dL Final   Enterococcus faecalis 11/26/2021 NOT DETECTED  NOT DETECTED Final   Enterococcus Faecium 11/26/2021 NOT DETECTED  NOT DETECTED Final   Listeria monocytogenes 11/26/2021 NOT DETECTED  NOT DETECTED Final   Staphylococcus species 11/26/2021 NOT DETECTED  NOT DETECTED Final   Staphylococcus aureus (BCID) 11/26/2021 NOT DETECTED  NOT DETECTED Final   Staphylococcus epidermidis 11/26/2021 NOT DETECTED  NOT DETECTED  Final   Staphylococcus lugdunensis 11/26/2021 NOT DETECTED  NOT DETECTED Final   Streptococcus species 11/26/2021 NOT DETECTED  NOT DETECTED Final   Streptococcus agalactiae 11/26/2021 NOT DETECTED  NOT DETECTED Final   Streptococcus pneumoniae 11/26/2021 NOT DETECTED  NOT DETECTED Final   Streptococcus pyogenes 11/26/2021 NOT DETECTED  NOT DETECTED Final   A.calcoaceticus-baumannii 11/26/2021 NOT DETECTED  NOT DETECTED Final   Bacteroides fragilis 11/26/2021 NOT DETECTED  NOT DETECTED Final   Enterobacterales 11/26/2021 NOT DETECTED  NOT DETECTED Final   Enterobacter cloacae complex 11/26/2021 NOT DETECTED  NOT DETECTED Final   Escherichia coli 11/26/2021 NOT DETECTED  NOT DETECTED Final   Klebsiella aerogenes 11/26/2021 NOT DETECTED  NOT DETECTED Final   Klebsiella oxytoca 11/26/2021 NOT DETECTED  NOT DETECTED Final   Klebsiella pneumoniae 11/26/2021 NOT DETECTED  NOT DETECTED Final   Proteus species 11/26/2021 NOT DETECTED  NOT DETECTED Final   Salmonella species 11/26/2021 NOT DETECTED  NOT DETECTED Final   Serratia marcescens 11/26/2021 NOT DETECTED  NOT DETECTED Final   Haemophilus influenzae 11/26/2021 NOT DETECTED  NOT DETECTED Final   Neisseria meningitidis 11/26/2021 NOT DETECTED  NOT DETECTED Final   Pseudomonas aeruginosa 11/26/2021 NOT DETECTED  NOT DETECTED Final   Stenotrophomonas maltophilia 11/26/2021 NOT DETECTED  NOT DETECTED Final   Candida albicans 11/26/2021 NOT DETECTED  NOT DETECTED Final   Candida auris 11/26/2021 NOT DETECTED  NOT DETECTED Final   Candida glabrata 11/26/2021 NOT DETECTED  NOT DETECTED Final   Candida krusei 11/26/2021 NOT DETECTED  NOT DETECTED Final   Candida parapsilosis 11/26/2021 NOT DETECTED  NOT DETECTED Final   Candida tropicalis 11/26/2021 NOT DETECTED  NOT DETECTED Final   Cryptococcus neoformans/gattii 11/26/2021 NOT DETECTED  NOT DETECTED Final   CTX-M ESBL 11/26/2021 NOT DETECTED  NOT DETECTED Final   Carbapenem resistance IMP  11/26/2021 NOT DETECTED  NOT DETECTED Final   Carbapenem resistance KPC 11/26/2021 NOT DETECTED  NOT DETECTED Final   Methicillin resistance mecA/C 11/26/2021 NOT DETECTED  NOT DETECTED Final   Meth resistant mecA/C and MREJ 11/26/2021 NOT DETECTED  NOT DETECTED Final   Carbapenem resistance NDM 11/26/2021 NOT DETECTED  NOT DETECTED Final   Carbapenem resist OXA 48 LIKE 11/26/2021 NOT DETECTED  NOT DETECTED Final   Vancomycin resistance 11/26/2021 NOT DETECTED  NOT DETECTED Final   Carbapenem resistance VIM 11/26/2021 NOT DETECTED  NOT DETECTED Final   Glucose-Capillary 11/27/2021 138 (H)  70 - 99 mg/dL Final   Glucose-Capillary 11/27/2021 145 (H)  70 - 99 mg/dL Final   Glucose-Capillary 11/27/2021 145 (H)  70 - 99 mg/dL Final   Creatinine, Ser 11/28/2021 0.57  0.44 - 1.00 mg/dL Final   GFR, Estimated 11/28/2021 >60  >60 mL/min Final   Glucose-Capillary 11/27/2021 85  70 - 99 mg/dL Final  Comment 1 11/27/2021 Notify RN   Final    Imaging Studies  DG Wrist Complete Right  Result Date: 11/26/2021 CLINICAL DATA:  A 70 year old female presents with pain and swelling of the RIGHT arm and hand since Friday. EXAM: RIGHT WRIST - COMPLETE 3+ VIEW COMPARISON:  Hand evaluation of the same date. Also RIGHT wrist from May of 2021. FINDINGS: Diffuse soft tissue swelling about the wrist best appreciated on the lateral projection. No visible fracture or sign of dislocation. No sign of acute bony destruction. IMPRESSION: Diffuse soft tissue swelling about the right wrist. No acute osseous abnormality or radiographic evidence of bone destruction. Findings are nonspecific. Would however suggest correlation with any history of trauma or signs of infection. Electronically Signed   By: Zetta Bills M.D.   On: 11/26/2021 15:15   CT WRIST RIGHT W CONTRAST  Result Date: 11/26/2021 CLINICAL DATA:  Soft tissue infection suspected, wrist. EXAM: CT OF THE UPPER RIGHT EXTREMITY WITH CONTRAST TECHNIQUE: Multidetector  CT imaging of the upper right extremity was performed according to the standard protocol following intravenous contrast administration. CONTRAST:  54mL OMNIPAQUE IOHEXOL 300 MG/ML  SOLN COMPARISON:  Radiograph performed earlier on the same date FINDINGS: Bones/Joint/Cartilage No evidence of fracture or dislocation. No cortical erosion or periosteal reaction concerning for osteomyelitis. Mild degenerative changes of the first carpometacarpal joint. Radiocarpal and intercarpal joints are intact. Ligaments Suboptimally assessed by CT. Muscles and Tendons Normal muscle density in size. No intramuscular collection or abscess. Soft tissues There is a subcutaneous peripherally enhancing fluid collection on the dorsal/ulnar aspect of the wrist measuring approximately 2.3 x 0.8 x 1.4 cm (series 7 image 155, series 9, image 81). Generalized subcutaneous soft tissue edema and skin thickening of the wrist and forearm. IMPRESSION: 1. Peripherally enhancing fluid collection about the dorsal/ulnar aspect of the wrist measuring at least 2.3 x 0.8 x 1.4 cm concerning for abscess. Clinical correlation is suggested. 2. Generalized subcutaneous soft tissue edema and skin thickening of the wrist and forearm. 3. No acute osseous abnormality. Electronically Signed   By: Keane Police D.O.   On: 11/26/2021 17:46   DG Hand Complete Right  Result Date: 11/26/2021 CLINICAL DATA:  Pain and swelling about the hand in a 70 year old female. EXAM: RIGHT HAND - COMPLETE 3+ VIEW COMPARISON:  Remote imaging from Apr 17, 2020 and prior wrist imaging of the same date. FINDINGS: Soft tissue swelling about the the volar and dorsal aspect of the wrist. No visible fracture. No signs of bony destruction. Degenerative changes are noted about the hand in the first carpometacarpal joint and distal interphalangeal joints. IMPRESSION: Soft tissue swelling about the volar and dorsal aspect of the wrist. No visible fracture or bony destruction. Correlate with  any history of trauma or signs of infection. Degenerative changes about the wrist and hand. Electronically Signed   By: Zetta Bills M.D.   On: 11/26/2021 15:17    Medications   Scheduled Meds:  amitriptyline  100 mg Oral QHS   aspirin EC  81 mg Oral QHS   buPROPion  150 mg Oral QHS   ezetimibe  10 mg Oral QHS   FLUoxetine  40 mg Oral Daily   gabapentin  100 mg Oral QHS   heparin  5,000 Units Subcutaneous Q8H   insulin aspart  0-9 Units Subcutaneous TID WC   metoprolol tartrate  12.5 mg Oral BID   pantoprazole  40 mg Oral Daily   rosuvastatin  40 mg Oral QHS   No  recently discontinued medications to reconcile  LOS: 2 days   Time spent: >5min  Duey Liller L Aamilah Augenstein, DO [facility] 11/28/2021, 7:07 AM   Available by Epic secure chat 7AM-7PM. If 7PM-7AM, please contact night-coverage Refer to [email or web address] to contact the [facility] Attending or Consulting provider for this pt

## 2021-11-28 NOTE — Progress Notes (Signed)
PHARMACIST - PHYSICIAN COMMUNICATION  CONCERNING:  Enoxaparin (Lovenox) for DVT Prophylaxis    RECOMMENDATION: Patient was prescribed enoxaprin 40mg  q24 hours for VTE prophylaxis.   Filed Weights   11/26/21 1423  Weight: 106.6 kg (235 lb)    Body mass index is 37.93 kg/m.  Estimated Creatinine Clearance: 79.6 mL/min (by C-G formula based on SCr of 0.57 mg/dL).   Based on [place] patient is candidate for enoxaparin 0.5mg /kg TBW SQ every 24 hours based on BMI being >30.  DESCRIPTION: Pharmacy has adjusted enoxaparin dose per [facility] policy.  Patient is now receiving enoxaparin 52.5 mg every 24 hours   Darrick Penna, PharmD, MS PGPM Clinical Pharmacist 11/28/2021 7:17 AM

## 2021-11-29 ENCOUNTER — Other Ambulatory Visit: Payer: Self-pay

## 2021-11-29 DIAGNOSIS — E669 Obesity, unspecified: Secondary | ICD-10-CM | POA: Diagnosis present

## 2021-11-29 DIAGNOSIS — R7881 Bacteremia: Secondary | ICD-10-CM | POA: Diagnosis present

## 2021-11-29 DIAGNOSIS — Z8249 Family history of ischemic heart disease and other diseases of the circulatory system: Secondary | ICD-10-CM

## 2021-11-29 DIAGNOSIS — I2510 Atherosclerotic heart disease of native coronary artery without angina pectoris: Secondary | ICD-10-CM | POA: Diagnosis present

## 2021-11-29 DIAGNOSIS — L02413 Cutaneous abscess of right upper limb: Secondary | ICD-10-CM | POA: Diagnosis not present

## 2021-11-29 DIAGNOSIS — G8929 Other chronic pain: Secondary | ICD-10-CM | POA: Diagnosis present

## 2021-11-29 DIAGNOSIS — Z803 Family history of malignant neoplasm of breast: Secondary | ICD-10-CM

## 2021-11-29 DIAGNOSIS — E1165 Type 2 diabetes mellitus with hyperglycemia: Secondary | ICD-10-CM | POA: Diagnosis present

## 2021-11-29 DIAGNOSIS — F411 Generalized anxiety disorder: Secondary | ICD-10-CM | POA: Diagnosis present

## 2021-11-29 DIAGNOSIS — W5501XD Bitten by cat, subsequent encounter: Secondary | ICD-10-CM | POA: Diagnosis not present

## 2021-11-29 DIAGNOSIS — L03113 Cellulitis of right upper limb: Secondary | ICD-10-CM | POA: Diagnosis not present

## 2021-11-29 DIAGNOSIS — F32A Depression, unspecified: Secondary | ICD-10-CM | POA: Diagnosis present

## 2021-11-29 DIAGNOSIS — R2231 Localized swelling, mass and lump, right upper limb: Secondary | ICD-10-CM | POA: Diagnosis not present

## 2021-11-29 DIAGNOSIS — A280 Pasteurellosis: Secondary | ICD-10-CM | POA: Diagnosis present

## 2021-11-29 DIAGNOSIS — Z83438 Family history of other disorder of lipoprotein metabolism and other lipidemia: Secondary | ICD-10-CM | POA: Diagnosis not present

## 2021-11-29 DIAGNOSIS — Z7982 Long term (current) use of aspirin: Secondary | ICD-10-CM

## 2021-11-29 DIAGNOSIS — Z8261 Family history of arthritis: Secondary | ICD-10-CM

## 2021-11-29 DIAGNOSIS — Z6835 Body mass index (BMI) 35.0-35.9, adult: Secondary | ICD-10-CM | POA: Diagnosis not present

## 2021-11-29 DIAGNOSIS — F17210 Nicotine dependence, cigarettes, uncomplicated: Secondary | ICD-10-CM | POA: Diagnosis present

## 2021-11-29 DIAGNOSIS — J449 Chronic obstructive pulmonary disease, unspecified: Secondary | ICD-10-CM | POA: Diagnosis present

## 2021-11-29 DIAGNOSIS — Z9049 Acquired absence of other specified parts of digestive tract: Secondary | ICD-10-CM | POA: Diagnosis not present

## 2021-11-29 DIAGNOSIS — Z885 Allergy status to narcotic agent status: Secondary | ICD-10-CM | POA: Diagnosis not present

## 2021-11-29 DIAGNOSIS — Z20822 Contact with and (suspected) exposure to covid-19: Secondary | ICD-10-CM | POA: Diagnosis present

## 2021-11-29 DIAGNOSIS — L03119 Cellulitis of unspecified part of limb: Secondary | ICD-10-CM | POA: Diagnosis not present

## 2021-11-29 DIAGNOSIS — I10 Essential (primary) hypertension: Secondary | ICD-10-CM | POA: Diagnosis not present

## 2021-11-29 DIAGNOSIS — Z833 Family history of diabetes mellitus: Secondary | ICD-10-CM

## 2021-11-29 DIAGNOSIS — Z981 Arthrodesis status: Secondary | ICD-10-CM | POA: Diagnosis not present

## 2021-11-29 DIAGNOSIS — E1143 Type 2 diabetes mellitus with diabetic autonomic (poly)neuropathy: Secondary | ICD-10-CM | POA: Diagnosis not present

## 2021-11-29 DIAGNOSIS — Z90710 Acquired absence of both cervix and uterus: Secondary | ICD-10-CM

## 2021-11-29 DIAGNOSIS — L02415 Cutaneous abscess of right lower limb: Secondary | ICD-10-CM | POA: Diagnosis not present

## 2021-11-29 DIAGNOSIS — R600 Localized edema: Secondary | ICD-10-CM | POA: Diagnosis not present

## 2021-11-29 DIAGNOSIS — Z79899 Other long term (current) drug therapy: Secondary | ICD-10-CM

## 2021-11-29 DIAGNOSIS — L02519 Cutaneous abscess of unspecified hand: Secondary | ICD-10-CM | POA: Diagnosis not present

## 2021-11-29 DIAGNOSIS — I080 Rheumatic disorders of both mitral and aortic valves: Secondary | ICD-10-CM | POA: Diagnosis present

## 2021-11-29 DIAGNOSIS — Z96653 Presence of artificial knee joint, bilateral: Secondary | ICD-10-CM | POA: Diagnosis present

## 2021-11-29 DIAGNOSIS — W5501XA Bitten by cat, initial encounter: Secondary | ICD-10-CM | POA: Diagnosis not present

## 2021-11-29 DIAGNOSIS — M542 Cervicalgia: Secondary | ICD-10-CM | POA: Diagnosis present

## 2021-11-29 DIAGNOSIS — K219 Gastro-esophageal reflux disease without esophagitis: Secondary | ICD-10-CM | POA: Diagnosis present

## 2021-11-29 DIAGNOSIS — M7989 Other specified soft tissue disorders: Secondary | ICD-10-CM | POA: Diagnosis not present

## 2021-11-29 DIAGNOSIS — Z91030 Bee allergy status: Secondary | ICD-10-CM | POA: Diagnosis not present

## 2021-11-29 DIAGNOSIS — E876 Hypokalemia: Secondary | ICD-10-CM | POA: Diagnosis present

## 2021-11-29 DIAGNOSIS — E785 Hyperlipidemia, unspecified: Secondary | ICD-10-CM | POA: Diagnosis present

## 2021-11-29 DIAGNOSIS — M25531 Pain in right wrist: Secondary | ICD-10-CM | POA: Diagnosis not present

## 2021-11-29 DIAGNOSIS — Z8371 Family history of colonic polyps: Secondary | ICD-10-CM

## 2021-11-29 DIAGNOSIS — L02419 Cutaneous abscess of limb, unspecified: Secondary | ICD-10-CM | POA: Diagnosis not present

## 2021-11-29 DIAGNOSIS — D539 Nutritional anemia, unspecified: Secondary | ICD-10-CM | POA: Diagnosis present

## 2021-11-29 DIAGNOSIS — S61551A Open bite of right wrist, initial encounter: Secondary | ICD-10-CM | POA: Diagnosis not present

## 2021-11-29 DIAGNOSIS — R609 Edema, unspecified: Secondary | ICD-10-CM | POA: Diagnosis present

## 2021-11-29 LAB — COMPREHENSIVE METABOLIC PANEL
ALT: 40 U/L (ref 0–44)
AST: 32 U/L (ref 15–41)
Albumin: 3 g/dL — ABNORMAL LOW (ref 3.5–5.0)
Alkaline Phosphatase: 78 U/L (ref 38–126)
Anion gap: 6 (ref 5–15)
BUN: 9 mg/dL (ref 8–23)
CO2: 25 mmol/L (ref 22–32)
Calcium: 8.8 mg/dL — ABNORMAL LOW (ref 8.9–10.3)
Chloride: 104 mmol/L (ref 98–111)
Creatinine, Ser: 0.54 mg/dL (ref 0.44–1.00)
GFR, Estimated: >60 mL/min (ref >60)
Glucose, Bld: 151 mg/dL — ABNORMAL HIGH (ref 70–99)
Potassium: 3.7 mmol/L (ref 3.5–5.1)
Sodium: 135 mmol/L (ref 135–145)
Total Bilirubin: 0.7 mg/dL (ref 0.3–1.2)
Total Protein: 6.3 g/dL — ABNORMAL LOW (ref 6.5–8.1)

## 2021-11-29 LAB — CULTURE, BLOOD (ROUTINE X 2)

## 2021-11-29 LAB — CBC
HCT: 34 % — ABNORMAL LOW (ref 36.0–46.0)
Hemoglobin: 11.4 g/dL — ABNORMAL LOW (ref 12.0–15.0)
MCH: 33.5 pg (ref 26.0–34.0)
MCHC: 33.5 g/dL (ref 30.0–36.0)
MCV: 100 fL (ref 80.0–100.0)
Platelets: 236 10*3/uL (ref 150–400)
RBC: 3.4 MIL/uL — ABNORMAL LOW (ref 3.87–5.11)
RDW: 11 % — ABNORMAL LOW (ref 11.5–15.5)
WBC: 8.1 10*3/uL (ref 4.0–10.5)
nRBC: 0 % (ref 0.0–0.2)

## 2021-11-29 MED ORDER — AMOXICILLIN-POT CLAVULANATE 875-125 MG PO TABS: 1.0000 | ORAL_TABLET | Freq: Two times a day (BID) | ORAL | 0 refills | Status: DC

## 2021-11-29 MED ORDER — SODIUM CHLORIDE 0.9 % IV SOLN: INTRAVENOUS | Status: DC | PRN

## 2021-11-29 NOTE — Discharge Summary (Signed)
Physician Discharge Summary  Amanda [name] JJO:[id number] DOB: 1951/10/26 DOA: 11/26/2021  PCP: Owens Loffler, MD  Admit date: 11/26/2021 Discharge date: 11/29/2021  Admitted From: Home Disposition: Home  Recommendations for Outpatient Follow-up:  Follow up with PCP in 1-2 weeks Follow-up with orthopedic surgery Please obtain BMP/CBC in one week Please follow up on the following pending results: None  Home Health: No Equipment/Devices: None Discharge Condition: Stable CODE STATUS: Full Diet recommendation: Heart Healthy / Carb Modified   Brief/Interim Summary: Amanda [name] is a 70 y.o. female with a PMH significant for HTN, COPD, CAD, type II DM.  Admitted on 11/26/2021 with concern of right hand pain and swelling for the past 7 days.  She was found to have right wrist abscess with surrounding cellulitis, most likely due to cat bite.  She initially received IV Zosyn and clindamycin.  Orthopedic surgery was also consulted and she underwent successful incision and drainage.  Symptoms started improving, no purulent discharge after the procedure.  Wound and blood cultures with Pasteurella which are normally sensitive to penicillin and beta-lactamase.  Zosyn was initially converted to Unasyn and she was discharged on Augmentin for 7 more days.  She will follow-up with orthopedic surgery as an outpatient for further recommendations.  She will continue with rest of her home medications and follow-up with her providers.  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 You are being given antibiotics for 7 days, please take it as directed. Please follow-up with your primary care doctor for further recommendations   Increase activity slowly   Complete by: As directed       Allergies as of 11/29/2021       Reactions   Honey Bee Venom [bee Venom] Anaphylaxis   Diazepam Other (See Comments)   REACTION: pt states she gets very angry and abusive verbally on this medication        Medication List     STOP taking these medications    methylPREDNISolone 4 MG Tbpk tablet Commonly known as: MEDROL DOSEPAK   traMADol 50 MG tablet Commonly known as: ULTRAM       TAKE these medications    acetaminophen 650 MG CR tablet Commonly known as: TYLENOL Take 650 mg by mouth 3 times/day as needed-between meals & bedtime (for headaches).   albuterol 108 (90 Base) MCG/ACT inhaler Commonly known as: VENTOLIN HFA Inhale 2 puffs into the lungs every 4 (four) hours as needed for wheezing or shortness of breath.   amitriptyline 50 MG tablet Commonly known as: ELAVIL TAKE 2 TABLETS BY MOUTH DAILY AT BEDTIME.   amoxicillin-clavulanate 875-125 MG tablet Commonly known as: AUGMENTIN Take 1 tablet by mouth 2 (two) times daily for 7 days.   aspirin EC 81 MG tablet Take 81 mg by mouth daily.   buPROPion 150 MG 24 hr tablet Commonly known as: WELLBUTRIN XL TAKE 1 TABLET (150 MG TOTAL) BY MOUTH  DAILY.   calcium carbonate 500 MG chewable tablet Commonly known as: TUMS - dosed in mg elemental calcium Chew 2 tablets by mouth daily as needed for indigestion or heartburn.   clonazePAM 0.5 MG tablet Commonly known as: KLONOPIN Take 1 tablet by mouth twice daily as needed   cyclobenzaprine 10 MG tablet Commonly known as: FLEXERIL TAKE 1 TABLET THREE TIMES DAILY AS NEEDED FOR MUSCLE SPAMS   ezetimibe 10 MG tablet Commonly known  as: ZETIA Take 1 tablet (10 mg total) by mouth daily.   FLUoxetine 40 MG capsule Commonly known as: PROZAC TAKE 1 CAPSULE (40 MG TOTAL) BY MOUTH DAILY.   gabapentin 100 MG capsule Commonly known as: NEURONTIN Take 1 capsule (100 mg total) by mouth at bedtime.   guaiFENesin 600 MG 12 hr tablet Commonly known as: MUCINEX Take 600 mg by mouth daily.   HYDROcodone-acetaminophen 5-325 MG tablet Commonly known as: NORCO/VICODIN Take 1 tablet by mouth every 8 (eight) hours as needed for moderate pain.   metoprolol tartrate 25 MG tablet Commonly known as: LOPRESSOR TAKE 1/2 TABLET TWICE DAILY   multivitamin tablet Take 1 tablet by mouth daily.   naproxen sodium 220 MG tablet Commonly known as: ALEVE Take 220 mg by mouth daily.   pantoprazole 40 MG tablet Commonly known as: PROTONIX TAKE 1 TABLET 30 MINUTES PRIOR TO BREAKFAST   polycarbophil 625 MG tablet Commonly known as: FIBERCON Take 625 mg by mouth daily as needed. FIBER THERAPY   rosuvastatin 40 MG tablet Commonly known as: CRESTOR TAKE 1 TABLET EVERY DAY   Simethicone 125 MG Caps Take 125 mg by mouth daily as needed (gas).   vitamin C 1000 MG tablet Take 1,000 mg by mouth daily.   Vitamin D 1000 units capsule Take 1,000 Units by mouth daily.   vitamin E 180 MG (400 UNITS) capsule Take 400 Units by mouth daily.        Follow-up Information     Copland, Spencer, MD. Schedule an appointment as soon as possible for a visit in 1 week(s).   Specialties: Family Medicine, Sports Medicine Contact information: [place] [address] [phone]         Hessie Knows, MD Follow up in 1 week(s).   Specialty: Orthopedic Surgery Contact information: [place] [address] [phone]                Allergies  Allergen Reactions   Honey Bee Venom [Bee Venom] Anaphylaxis   Diazepam Other (See Comments)    REACTION: pt states  she gets very angry and abusive verbally on this medication     Consultations: Orthopedic surgery  Procedures/Studies: DG Wrist Complete Right  Result Date: 11/26/2021 CLINICAL DATA:  A 70 year old female presents with pain and swelling of the RIGHT arm and hand since Friday. EXAM: RIGHT WRIST - COMPLETE 3+ VIEW COMPARISON:  Hand evaluation of the same date. Also RIGHT wrist from May of 2021. FINDINGS: Diffuse soft tissue swelling about the wrist best appreciated on the lateral projection. No visible fracture or sign of dislocation. No sign of acute bony destruction. IMPRESSION: Diffuse soft tissue swelling about the right wrist. No acute osseous abnormality or radiographic evidence of bone destruction. Findings are nonspecific. Would however suggest correlation with any history of trauma or signs of infection. Electronically Signed   By: Zetta Bills M.D.   On: 11/26/2021 15:15   CT WRIST RIGHT W CONTRAST  Result Date:  11/26/2021 CLINICAL DATA:  Soft tissue infection suspected, wrist. EXAM: CT OF THE UPPER RIGHT EXTREMITY WITH CONTRAST TECHNIQUE: Multidetector CT imaging of the upper right extremity was performed according to the standard protocol following intravenous contrast administration. CONTRAST:  43mL OMNIPAQUE IOHEXOL 300 MG/ML  SOLN COMPARISON:  Radiograph performed earlier on the same date FINDINGS: Bones/Joint/Cartilage No evidence of fracture or dislocation. No cortical erosion or periosteal reaction concerning for osteomyelitis. Mild degenerative changes of the first carpometacarpal joint. Radiocarpal and intercarpal joints are intact. Ligaments Suboptimally assessed by CT. Muscles and Tendons Normal muscle density in size. No intramuscular collection or abscess. Soft tissues There is a subcutaneous peripherally enhancing fluid collection on the dorsal/ulnar aspect of the wrist measuring approximately 2.3 x 0.8 x 1.4 cm (series 7 image 155, series 9, image 81). Generalized  subcutaneous soft tissue edema and skin thickening of the wrist and forearm. IMPRESSION: 1. Peripherally enhancing fluid collection about the dorsal/ulnar aspect of the wrist measuring at least 2.3 x 0.8 x 1.4 cm concerning for abscess. Clinical correlation is suggested. 2. Generalized subcutaneous soft tissue edema and skin thickening of the wrist and forearm. 3. No acute osseous abnormality. Electronically Signed   By: Keane Police D.O.   On: 11/26/2021 17:46   DG Hand Complete Right  Result Date: 11/26/2021 CLINICAL DATA:  Pain and swelling about the hand in a 70 year old female. EXAM: RIGHT HAND - COMPLETE 3+ VIEW COMPARISON:  Remote imaging from Apr 17, 2020 and prior wrist imaging of the same date. FINDINGS: Soft tissue swelling about the the volar and dorsal aspect of the wrist. No visible fracture. No signs of bony destruction. Degenerative changes are noted about the hand in the first carpometacarpal joint and distal interphalangeal joints. IMPRESSION: Soft tissue swelling about the volar and dorsal aspect of the wrist. No visible fracture or bony destruction. Correlate with any history of trauma or signs of infection. Degenerative changes about the wrist and hand. Electronically Signed   By: Zetta Bills M.D.   On: 11/26/2021 15:17    Subjective: Patient was seen and examined today.  Stating that right wrist is improving although she is still having some swelling and unable to use the grip.  Wants to go home.  We discussed about using antibiotics for 1 week and follow-up with orthopedic surgery.  Discharge Exam: Vitals:   11/29/21 0442 11/29/21 0823  BP: (!) 145/59 128/62  Pulse: 88 77  Resp: 20 16  Temp: 97.7 F (36.5 C) 98.4 F (36.9 C)  SpO2: 95% 96%   Vitals:   11/28/21 1513 11/28/21 1956 11/29/21 0442 11/29/21 0823  BP: (!) 145/71 (!) 135/46 (!) 145/59 128/62  Pulse: 84 86 88 77  Resp: 17 16 20 16   Temp: 99.3 F (37.4 C) 98.1 F (36.7 C) 97.7 F (36.5 C) 98.4 F (36.9  C)  TempSrc: Oral Oral Oral   SpO2: 98% 99% 95% 96%  Weight:      Height:        General: Pt is alert, awake, not in acute distress Cardiovascular: RRR, S1/S2 +, no rubs, no gallops Respiratory: CTA bilaterally, no wheezing, no rhonchi Abdominal: Soft, NT, ND, bowel sounds + Extremities: no edema, no cyanosis, right wrist with some edema and erythema, improved as compared to prior.  No obvious drainage.   The results of significant diagnostics from this hospitalization (including imaging, microbiology, ancillary and laboratory) are listed below for reference.    Microbiology: Recent Results (from the past 240 hour(s))  Culture,  blood (routine x 2)     Status: Abnormal   Collection Time: 11/26/21  4:23 PM   Specimen: BLOOD  Result Value Ref Range Status   Specimen Description   Final    BLOOD BLLA Performed at [facility], [address] [place], [place] [address]    Special Requests   Final    BOTTLES DRAWN AEROBIC AND ANAEROBIC BCHV Performed at [facility], [address] [place], [place] [address]    Culture  Setup Time   Final    GRAM NEGATIVE RODS AEROBIC BOTTLE ONLY CRITICAL VALUE NOTED.  VALUE IS CONSISTENT WITH PREVIOUSLY REPORTED AND CALLED VALUE. Performed at [facility], [address] [place], [place] [address]    Culture (A)  Final    PASTEURELLA MULTOCIDA Usually susceptible to penicillin and other beta lactam agents,quinolones,macrolides and tetracyclines. Performed at [facility], [place] [address], [place], [place] [address]    Report Status 11/29/2021 FINAL  Final  Culture, blood (routine x 2)     Status: Abnormal   Collection Time: 11/26/21  4:23 PM   Specimen: BLOOD  Result Value Ref Range Status   Specimen Description   Final    BLOOD LAC Performed at [facility], [address] [place], [place] [address]    Special Requests   Final    BOTTLES DRAWN AEROBIC AND ANAEROBIC BCAV Performed at  [facility], [address], [place], [place] [address]    Culture  Setup Time   Final    GRAM NEGATIVE RODS AEROBIC BOTTLE ONLY Organism ID to follow CRITICAL RESULT CALLED TO, READ BACK BY AND VERIFIED WITHNilsa Nutting [id number] 11/27/21 HNM Performed at [facility], [address] [place], [place] [address]    Culture (A)  Final    PASTEURELLA MULTOCIDA Usually susceptible to penicillin and other beta lactam agents,quinolones,macrolides and tetracyclines. Performed at [facility], [place] [address] [place], [place] [address]    Report Status 11/29/2021 FINAL  Final  Blood Culture ID Panel (Reflexed)     Status: None   Collection Time: 11/26/21  4:23 PM  Result Value Ref Range Status   Enterococcus faecalis NOT DETECTED NOT DETECTED Final   Enterococcus Faecium NOT DETECTED NOT DETECTED Final   Listeria monocytogenes NOT DETECTED NOT DETECTED Final   Staphylococcus species NOT DETECTED NOT DETECTED Final   Staphylococcus aureus (BCID) NOT DETECTED NOT DETECTED Final   Staphylococcus epidermidis NOT DETECTED NOT DETECTED Final   Staphylococcus lugdunensis NOT DETECTED NOT DETECTED Final   Streptococcus species NOT DETECTED NOT DETECTED Final   Streptococcus agalactiae NOT DETECTED NOT DETECTED Final   Streptococcus pneumoniae NOT DETECTED NOT DETECTED Final   Streptococcus pyogenes NOT DETECTED NOT DETECTED Final   A.calcoaceticus-baumannii NOT DETECTED NOT DETECTED Final   Bacteroides fragilis NOT DETECTED NOT DETECTED Final   Enterobacterales NOT DETECTED NOT DETECTED Final   Enterobacter cloacae complex NOT DETECTED NOT DETECTED Final   Escherichia coli NOT DETECTED NOT DETECTED Final   Klebsiella aerogenes NOT DETECTED NOT DETECTED Final   Klebsiella oxytoca NOT DETECTED NOT DETECTED Final   Klebsiella pneumoniae NOT DETECTED NOT DETECTED Final   Proteus species NOT DETECTED NOT DETECTED Final   Salmonella species NOT DETECTED NOT DETECTED  Final   Serratia marcescens NOT DETECTED NOT DETECTED Final   Haemophilus influenzae NOT DETECTED NOT DETECTED Final   Neisseria meningitidis NOT DETECTED NOT DETECTED Final   Pseudomonas aeruginosa NOT DETECTED NOT  DETECTED Final   Stenotrophomonas maltophilia NOT DETECTED NOT DETECTED Final   Candida albicans NOT DETECTED NOT DETECTED Final   Candida auris NOT DETECTED NOT DETECTED Final   Candida glabrata NOT DETECTED NOT DETECTED Final   Candida krusei NOT DETECTED NOT DETECTED Final   Candida parapsilosis NOT DETECTED NOT DETECTED Final   Candida tropicalis NOT DETECTED NOT DETECTED Final   Cryptococcus neoformans/gattii NOT DETECTED NOT DETECTED Final   CTX-M ESBL NOT DETECTED NOT DETECTED Final   Carbapenem resistance IMP NOT DETECTED NOT DETECTED Final   Carbapenem resistance KPC NOT DETECTED NOT DETECTED Final   Methicillin resistance mecA/C NOT DETECTED NOT DETECTED Final   Meth resistant mecA/C and MREJ NOT DETECTED NOT DETECTED Final   Carbapenem resistance NDM NOT DETECTED NOT DETECTED Final   Carbapenem resist OXA 48 LIKE NOT DETECTED NOT DETECTED Final   Vancomycin resistance NOT DETECTED NOT DETECTED Final   Carbapenem resistance VIM NOT DETECTED NOT DETECTED Final    Comment: Performed at [facility], [place], [place] [address]  Aerobic Culture w Gram Stain (superficial specimen)     Status: None   Collection Time: 11/26/21  8:24 PM   Specimen: Abscess  Result Value Ref Range Status   Specimen Description   Final    ABSCESS Performed at [facility], [address] [place], [place] [address]    Special Requests   Final    NONE Performed at [facility], [address] [place], [place] [address]    Gram Stain   Final    ABUNDANT WBC PRESENT, PREDOMINANTLY PMN NO ORGANISMS SEEN    Culture   Final    RARE PASTEURELLA MULTOCIDA Usually susceptible to penicillin and other beta lactam agents,quinolones,macrolides and  tetracyclines. Performed at [facility], [place] [address], [place], [place] [address]    Report Status 11/28/2021 FINAL  Final  Resp Panel by RT-PCR (Flu A&B, Covid) Nasopharyngeal Swab     Status: None   Collection Time: 11/26/21  8:26 PM   Specimen: Nasopharyngeal Swab; Nasopharyngeal(NP) swabs in vial transport medium  Result Value Ref Range Status   SARS Coronavirus 2 by RT PCR NEGATIVE NEGATIVE Final    Comment: (NOTE) SARS-CoV-2 target nucleic acids are NOT DETECTED.  The SARS-CoV-2 RNA is generally detectable in upper respiratory specimens during the acute phase of infection. The lowest concentration of SARS-CoV-2 viral copies this assay can detect is 138 copies/mL. A negative result does not preclude SARS-Cov-2 infection and should not be used as the sole basis for treatment or other patient management decisions. A negative result may occur with  improper specimen collection/handling, submission of specimen other than nasopharyngeal swab, presence of viral mutation(s) within the areas targeted by this assay, and inadequate number of viral copies(<138 copies/mL). A negative result must be combined with clinical observations, patient history, and epidemiological information. The expected result is Negative.  Fact Sheet for Patients:  EntrepreneurPulse.com.au  Fact Sheet for Healthcare Providers:  IncredibleEmployment.be  This test is no t yet approved or cleared by the [place] FDA and  has been authorized for detection and/or diagnosis of SARS-CoV-2 by FDA under an Emergency Use Authorization (EUA). This EUA will remain  in effect (meaning this test can be used) for the duration of the COVID-19 declaration under Section 564(b)(1) of the Act, 21 U.S.C.section 360bbb-3(b)(1), unless the authorization is terminated  or revoked sooner.       Influenza A by PCR NEGATIVE NEGATIVE Final  Influenza B by PCR NEGATIVE NEGATIVE Final     Comment: (NOTE) The Xpert Xpress SARS-CoV-2/FLU/RSV plus assay is intended as an aid in the diagnosis of influenza from Nasopharyngeal swab specimens and should not be used as a sole basis for treatment. Nasal washings and aspirates are unacceptable for Xpert Xpress SARS-CoV-2/FLU/RSV testing.  Fact Sheet for Patients: EntrepreneurPulse.com.au  Fact Sheet for Healthcare Providers: IncredibleEmployment.be  This test is not yet approved or cleared by the [place] FDA and has been authorized for detection and/or diagnosis of SARS-CoV-2 by FDA under an Emergency Use Authorization (EUA). This EUA will remain in effect (meaning this test can be used) for the duration of the COVID-19 declaration under Section 564(b)(1) of the Act, 21 U.S.C. section 360bbb-3(b)(1), unless the authorization is terminated or revoked.  Performed at [facility], [address] [place], [place] [address]      Labs: BNP (last 3 results) No results for input(s): BNP in the last 8760 hours. Basic Metabolic Panel: Recent Labs  Lab 11/26/21 1623 11/28/21 0456 11/29/21 0314  NA 133*  --  135  K 3.4*  --  3.7  CL 99  --  104  CO2 26  --  25  GLUCOSE 153*  --  151*  BUN 11  --  9  CREATININE 0.69 0.57 0.54  CALCIUM 9.4  --  8.8*   Liver Function Tests: Recent Labs  Lab 11/26/21 1623 11/29/21 0314  AST 24 32  ALT 20 40  ALKPHOS 48 78  BILITOT 0.8 0.7  PROT 7.3 6.3*  ALBUMIN 3.7 3.0*   No results for input(s): LIPASE, AMYLASE in the last 168 hours. No results for input(s): AMMONIA in the last 168 hours. CBC: Recent Labs  Lab 11/26/21 1623 11/28/21 0857 11/29/21 0314  WBC 8.9 6.6 8.1  NEUTROABS 7.0  --   --   HGB 12.0 10.9* 11.4*  HCT 35.6* 32.3* 34.0*  MCV 101.4* 99.1 100.0  PLT 201 218 236   Cardiac Enzymes: No results for input(s): CKTOTAL, CKMB, CKMBINDEX, TROPONINI in the last 168 hours. BNP: Invalid input(s):  POCBNP CBG: Recent Labs  Lab 11/27/21 0757 11/27/21 1150 11/27/21 1620 11/27/21 2242 11/28/21 0804  GLUCAP 138* 145* 145* 85 122*   D-Dimer No results for input(s): DDIMER in the last 72 hours. Hgb A1c Recent Labs    11/26/21 1623  HGBA1C 6.4*   Lipid Profile No results for input(s): CHOL, HDL, LDLCALC, TRIG, CHOLHDL, LDLDIRECT in the last 72 hours. Thyroid function studies No results for input(s): TSH, T4TOTAL, T3FREE, THYROIDAB in the last 72 hours.  Invalid input(s): FREET3 Anemia work up No results for input(s): VITAMINB12, FOLATE, FERRITIN, TIBC, IRON, RETICCTPCT in the last 72 hours. Urinalysis    Component Value Date/Time   COLORURINE YELLOW (A) 10/24/2019 0559   APPEARANCEUR CLEAR (A) 10/24/2019 0559   LABSPEC 1.009 10/24/2019 0559   PHURINE 7.0 10/24/2019 0559   GLUCOSEU NEGATIVE 10/24/2019 0559   HGBUR SMALL (A) 10/24/2019 0559   BILIRUBINUR NEGATIVE 10/24/2019 0559   KETONESUR NEGATIVE 10/24/2019 0559   PROTEINUR NEGATIVE 10/24/2019 0559   NITRITE NEGATIVE 10/24/2019 0559   LEUKOCYTESUR MODERATE (A) 10/24/2019 0559   Sepsis Labs Invalid input(s): PROCALCITONIN,  WBC,  LACTICIDVEN Microbiology Recent Results (from the past 240 hour(s))  Culture, blood (routine x 2)     Status: Abnormal   Collection Time: 11/26/21  4:23 PM   Specimen: BLOOD  Result Value Ref Range Status   Specimen Description   Final  BLOOD BLLA Performed at [facility], [address], [place], [place] [address]    Special Requests   Final    BOTTLES DRAWN AEROBIC AND ANAEROBIC [place] Performed at [facility], [address] [place], [place] [address]    Culture  Setup Time   Final    GRAM NEGATIVE RODS AEROBIC BOTTLE ONLY CRITICAL VALUE NOTED.  VALUE IS CONSISTENT WITH PREVIOUSLY REPORTED AND CALLED VALUE. Performed at [facility], [address] [place], [place] [address]    Culture (A)  Final    PASTEURELLA MULTOCIDA Usually susceptible  to penicillin and other beta lactam agents,quinolones,macrolides and tetracyclines. Performed at [facility], [place] [address] [place], [place] [address]    Report Status 11/29/2021 FINAL  Final  Culture, blood (routine x 2)     Status: Abnormal   Collection Time: 11/26/21  4:23 PM   Specimen: BLOOD  Result Value Ref Range Status   Specimen Description   Final    BLOOD LAC Performed at [facility], [address] [place], [place] [address]    Special Requests   Final    BOTTLES DRAWN AEROBIC AND ANAEROBIC BCAV Performed at [facility], [address] [place], [place] [address]    Culture  Setup Time   Final    GRAM NEGATIVE RODS AEROBIC BOTTLE ONLY Organism ID to follow CRITICAL RESULT CALLED TO, READ BACK BY AND VERIFIED WITHNilsa Nutting [id number] 11/27/21 HNM Performed at [facility], [address] [place],  [address]    Culture (A)  Final    PASTEURELLA MULTOCIDA Usually susceptible to penicillin and other beta lactam agents,quinolones,macrolides and tetracyclines. Performed at [facility], [place] [address] [place],  [address]    Report Status 11/29/2021 FINAL  Final  Blood Culture ID Panel (Reflexed)     Status: None   Collection Time: 11/26/21  4:23 PM  Result Value Ref Range Status   Enterococcus faecalis NOT DETECTED NOT DETECTED Final   Enterococcus Faecium NOT DETECTED NOT DETECTED Final   Listeria monocytogenes NOT DETECTED NOT DETECTED Final   Staphylococcus species NOT DETECTED NOT DETECTED Final   Staphylococcus aureus (BCID) NOT DETECTED NOT DETECTED Final   Staphylococcus epidermidis NOT DETECTED NOT DETECTED Final   Staphylococcus lugdunensis NOT DETECTED NOT DETECTED Final   Streptococcus species NOT DETECTED NOT DETECTED Final   Streptococcus agalactiae NOT DETECTED NOT DETECTED Final   Streptococcus pneumoniae NOT DETECTED NOT DETECTED Final   Streptococcus pyogenes NOT DETECTED NOT  DETECTED Final   A.calcoaceticus-baumannii NOT DETECTED NOT DETECTED Final   Bacteroides fragilis NOT DETECTED NOT DETECTED Final   Enterobacterales NOT DETECTED NOT DETECTED Final   Enterobacter cloacae complex NOT DETECTED NOT DETECTED Final   Escherichia coli NOT DETECTED NOT DETECTED Final   Klebsiella aerogenes NOT DETECTED NOT DETECTED Final   Klebsiella oxytoca NOT DETECTED NOT DETECTED Final   Klebsiella pneumoniae NOT DETECTED NOT DETECTED Final   Proteus species NOT DETECTED NOT DETECTED Final   Salmonella species NOT DETECTED NOT DETECTED Final   Serratia marcescens NOT DETECTED NOT DETECTED Final   Haemophilus influenzae NOT DETECTED NOT DETECTED Final   Neisseria meningitidis NOT DETECTED NOT DETECTED Final   Pseudomonas aeruginosa NOT DETECTED NOT DETECTED Final   Stenotrophomonas maltophilia NOT DETECTED NOT DETECTED Final   Candida albicans NOT DETECTED NOT DETECTED Final   Candida auris NOT DETECTED NOT DETECTED Final   Candida glabrata NOT DETECTED NOT DETECTED Final  Candida krusei NOT DETECTED NOT DETECTED Final   Candida parapsilosis NOT DETECTED NOT DETECTED Final   Candida tropicalis NOT DETECTED NOT DETECTED Final   Cryptococcus neoformans/gattii NOT DETECTED NOT DETECTED Final   CTX-M ESBL NOT DETECTED NOT DETECTED Final   Carbapenem resistance IMP NOT DETECTED NOT DETECTED Final   Carbapenem resistance KPC NOT DETECTED NOT DETECTED Final   Methicillin resistance mecA/C NOT DETECTED NOT DETECTED Final   Meth resistant mecA/C and MREJ NOT DETECTED NOT DETECTED Final   Carbapenem resistance NDM NOT DETECTED NOT DETECTED Final   Carbapenem resist OXA 48 LIKE NOT DETECTED NOT DETECTED Final   Vancomycin resistance NOT DETECTED NOT DETECTED Final   Carbapenem resistance VIM NOT DETECTED NOT DETECTED Final    Comment: Performed at [facility], [address] [place], [place] [address]  Aerobic Culture w Gram Stain (superficial specimen)     Status:  None   Collection Time: 11/26/21  8:24 PM   Specimen: Abscess  Result Value Ref Range Status   Specimen Description   Final    ABSCESS Performed at [facility], [address], [place], [place] [address]    Special Requests   Final    NONE Performed at [facility], [address] [place], [place] [address]    Gram Stain   Final    ABUNDANT WBC PRESENT, PREDOMINANTLY PMN NO ORGANISMS SEEN    Culture   Final    RARE PASTEURELLA MULTOCIDA Usually susceptible to penicillin and other beta lactam agents,quinolones,macrolides and tetracyclines. Performed at [facility], [place] [address], [place], [place] [address]    Report Status 11/28/2021 FINAL  Final  Resp Panel by RT-PCR (Flu A&B, Covid) Nasopharyngeal Swab     Status: None   Collection Time: 11/26/21  8:26 PM   Specimen: Nasopharyngeal Swab; Nasopharyngeal(NP) swabs in vial transport medium  Result Value Ref Range Status   SARS Coronavirus 2 by RT PCR NEGATIVE NEGATIVE Final    Comment: (NOTE) SARS-CoV-2 target nucleic acids are NOT DETECTED.  The SARS-CoV-2 RNA is generally detectable in upper respiratory specimens during the acute phase of infection. The lowest concentration of SARS-CoV-2 viral copies this assay can detect is 138 copies/mL. A negative result does not preclude SARS-Cov-2 infection and should not be used as the sole basis for treatment or other patient management decisions. A negative result may occur with  improper specimen collection/handling, submission of specimen other than nasopharyngeal swab, presence of viral mutation(s) within the areas targeted by this assay, and inadequate number of viral copies(<138 copies/mL). A negative result must be combined with clinical observations, patient history, and epidemiological information. The expected result is Negative.  Fact Sheet for Patients:  EntrepreneurPulse.com.au  Fact Sheet for Healthcare Providers:   IncredibleEmployment.be  This test is no t yet approved or cleared by the [place] FDA and  has been authorized for detection and/or diagnosis of SARS-CoV-2 by FDA under an Emergency Use Authorization (EUA). This EUA will remain  in effect (meaning this test can be used) for the duration of the COVID-19 declaration under Section 564(b)(1) of the Act, 21 U.S.C.section 360bbb-3(b)(1), unless the authorization is terminated  or revoked sooner.       Influenza A by PCR NEGATIVE NEGATIVE Final   Influenza B by PCR NEGATIVE NEGATIVE Final    Comment: (NOTE) The Xpert Xpress SARS-CoV-2/FLU/RSV plus assay is intended as an aid in the diagnosis of influenza from Nasopharyngeal swab specimens and should not be used  as a sole basis for treatment. Nasal washings and aspirates are unacceptable for Xpert Xpress SARS-CoV-2/FLU/RSV testing.  Fact Sheet for Patients: EntrepreneurPulse.com.au  Fact Sheet for Healthcare Providers: IncredibleEmployment.be  This test is not yet approved or cleared by the [place] FDA and has been authorized for detection and/or diagnosis of SARS-CoV-2 by FDA under an Emergency Use Authorization (EUA). This EUA will remain in effect (meaning this test can be used) for the duration of the COVID-19 declaration under Section 564(b)(1) of the Act, 21 U.S.C. section 360bbb-3(b)(1), unless the authorization is terminated or revoked.  Performed at [facility], [address] [place], [place] [address]     Time coordinating discharge: Over 30 minutes  SIGNED:  Lorella Nimrod, MD  [facility] 11/29/2021, 3:47 PM  If 7PM-7AM, please contact night-coverage [email or web address]  This record has been created using Systems analyst. Errors have been sought and corrected,but may not always be located. Such creation errors do not reflect on the standard of care.

## 2021-11-29 NOTE — ED Triage Notes (Signed)
Pt c/o pain in right wrist from a cat bite/scratch. Pt was admitted a week ago and discharged earlier today.  Pt has redness and swelling to right hand and wrist. Pt states her hand swelling and redness have increased since being discharged. Pt c/o pain in same.

## 2021-11-29 NOTE — Discharge Instructions (Signed)
Elevate right hand and ice as needed for swelling.

## 2021-11-29 NOTE — Progress Notes (Signed)
°  Subjective:   R hand cellulitis, R wrist abscess Patient reports pain as  improved. States wrist has improved in appearance as well .    Vital signs in last 24 hours: Temp:  [97.7 F (36.5 C)-99.3 F (37.4 C)] 98.4 F (36.9 C) (12/24 0823) Pulse Rate:  [77-88] 77 (12/24 0823) Resp:  [16-20] 16 (12/24 0823) BP: (128-145)/(46-71) 128/62 (12/24 0823) SpO2:  [95 %-99 %] 96 % (12/24 0823)  Intake/Output from previous day:  Intake/Output Summary (Last 24 hours) at 11/29/2021 1146 Last data filed at 11/29/2021 1040 [place] per 24 hour  Intake 560.21 ml  Output 0 ml  Net 560.21 ml    Intake/Output this shift: Total I/O In: 338.4 [P.O.:240; IV Piggyback:98.4] Out: -   Labs: Recent Labs    11/26/21 1623 11/28/21 0857 11/29/21 0314  HGB 12.0 10.9* 11.4*   Recent Labs    11/28/21 0857 11/29/21 0314  WBC 6.6 8.1  RBC 3.26* 3.40*  HCT 32.3* 34.0*  PLT 218 236   Recent Labs    11/26/21 1623 11/28/21 0456 11/29/21 0314  NA 133*  --  135  K 3.4*  --  3.7  CL 99  --  104  CO2 26  --  25  BUN 11  --  9  CREATININE 0.69 0.57 0.54  GLUCOSE 153*  --  151*  CALCIUM 9.4  --  8.8*   No results for input(s): LABPT, INR in the last 72 hours.   EXAM General - Patient is Alert, Appropriate, and Oriented Extremity -  significant edema of the R hand, able to flex and extend fingers and wrist, though with discomfort  erythema noted over dorsal and lateral aspect of wrist sensation intact to light touch over ulnar, median, and radial nerve distributions    Assessment/Plan:    Principal Problem:   Abscess, wrist Active Problems:   Controlled type 2 diabetes mellitus with diabetic autonomic neuropathy, without long-term current use of insulin (HCC)   Essential hypertension   COPD with chronic bronchitis (HCC)   CAD (coronary artery disease), native coronary artery   Abscess of wrist   Cat bite   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   Weight as of this encounter: 106.6 kg.  Continue IV abx per medicine  Patient seen with Dr Roland Rack.  Will recheck tomorrow. Discussed possible need for I&D if symptoms worsen or do not improve.      Cassell Smiles, PA-C [facility] 11/29/2021, 11:46 AM

## 2021-11-29 NOTE — ED Triage Notes (Signed)
This RN spoke with EDP [place] regarding care, due to patient recent discharge from hospital no bloodwork ordered at this time.

## 2021-11-29 NOTE — Progress Notes (Signed)
Writer educated patient to elevate right hand and ice as needed when discharged home. As well as educated patient to take all ABT until gone and follow up with all follow up appointments. Discharge instructions/scripts reviewed and given with patient.

## 2021-11-29 NOTE — Progress Notes (Signed)
Patient is waiting for her transportation.

## 2021-11-30 ENCOUNTER — Emergency Department: Payer: Medicare Other

## 2021-11-30 ENCOUNTER — Inpatient Hospital Stay
Admission: EM | Admit: 2021-11-30 | Discharge: 2021-12-05 | DRG: 603 | Disposition: A | Discharge status: Home / Self Care | Payer: Medicare Other | Attending: Internal Medicine | Admitting: Internal Medicine

## 2021-11-30 DIAGNOSIS — I10 Essential (primary) hypertension: Secondary | ICD-10-CM | POA: Diagnosis present

## 2021-11-30 DIAGNOSIS — J449 Chronic obstructive pulmonary disease, unspecified: Secondary | ICD-10-CM | POA: Diagnosis present

## 2021-11-30 DIAGNOSIS — I080 Rheumatic disorders of both mitral and aortic valves: Secondary | ICD-10-CM | POA: Diagnosis present

## 2021-11-30 DIAGNOSIS — M25531 Pain in right wrist: Secondary | ICD-10-CM | POA: Diagnosis not present

## 2021-11-30 DIAGNOSIS — L02413 Cutaneous abscess of right upper limb: Secondary | ICD-10-CM | POA: Diagnosis present

## 2021-11-30 DIAGNOSIS — Z8261 Family history of arthritis: Secondary | ICD-10-CM | POA: Diagnosis not present

## 2021-11-30 DIAGNOSIS — D539 Nutritional anemia, unspecified: Secondary | ICD-10-CM | POA: Diagnosis present

## 2021-11-30 DIAGNOSIS — R7881 Bacteremia: Secondary | ICD-10-CM | POA: Diagnosis present

## 2021-11-30 DIAGNOSIS — Z83438 Family history of other disorder of lipoprotein metabolism and other lipidemia: Secondary | ICD-10-CM | POA: Diagnosis not present

## 2021-11-30 DIAGNOSIS — F32A Depression, unspecified: Secondary | ICD-10-CM

## 2021-11-30 DIAGNOSIS — W5501XA Bitten by cat, initial encounter: Secondary | ICD-10-CM | POA: Diagnosis not present

## 2021-11-30 DIAGNOSIS — I2510 Atherosclerotic heart disease of native coronary artery without angina pectoris: Secondary | ICD-10-CM | POA: Diagnosis present

## 2021-11-30 DIAGNOSIS — R609 Edema, unspecified: Secondary | ICD-10-CM | POA: Diagnosis present

## 2021-11-30 DIAGNOSIS — F411 Generalized anxiety disorder: Secondary | ICD-10-CM | POA: Diagnosis present

## 2021-11-30 DIAGNOSIS — W5501XD Bitten by cat, subsequent encounter: Secondary | ICD-10-CM | POA: Diagnosis not present

## 2021-11-30 DIAGNOSIS — L02519 Cutaneous abscess of unspecified hand: Secondary | ICD-10-CM | POA: Diagnosis not present

## 2021-11-30 DIAGNOSIS — E1143 Type 2 diabetes mellitus with diabetic autonomic (poly)neuropathy: Secondary | ICD-10-CM

## 2021-11-30 DIAGNOSIS — F17210 Nicotine dependence, cigarettes, uncomplicated: Secondary | ICD-10-CM | POA: Diagnosis present

## 2021-11-30 DIAGNOSIS — L03113 Cellulitis of right upper limb: Secondary | ICD-10-CM | POA: Diagnosis present

## 2021-11-30 DIAGNOSIS — Z91030 Bee allergy status: Secondary | ICD-10-CM | POA: Diagnosis not present

## 2021-11-30 DIAGNOSIS — E669 Obesity, unspecified: Secondary | ICD-10-CM | POA: Diagnosis present

## 2021-11-30 DIAGNOSIS — Z885 Allergy status to narcotic agent status: Secondary | ICD-10-CM | POA: Diagnosis not present

## 2021-11-30 DIAGNOSIS — A280 Pasteurellosis: Secondary | ICD-10-CM | POA: Diagnosis present

## 2021-11-30 DIAGNOSIS — Z6835 Body mass index (BMI) 35.0-35.9, adult: Secondary | ICD-10-CM | POA: Diagnosis not present

## 2021-11-30 DIAGNOSIS — L03119 Cellulitis of unspecified part of limb: Secondary | ICD-10-CM | POA: Diagnosis not present

## 2021-11-30 DIAGNOSIS — L02415 Cutaneous abscess of right lower limb: Secondary | ICD-10-CM | POA: Diagnosis not present

## 2021-11-30 DIAGNOSIS — S61551A Open bite of right wrist, initial encounter: Secondary | ICD-10-CM | POA: Diagnosis not present

## 2021-11-30 DIAGNOSIS — M7989 Other specified soft tissue disorders: Secondary | ICD-10-CM | POA: Diagnosis not present

## 2021-11-30 DIAGNOSIS — I350 Nonrheumatic aortic (valve) stenosis: Secondary | ICD-10-CM

## 2021-11-30 DIAGNOSIS — Z981 Arthrodesis status: Secondary | ICD-10-CM | POA: Diagnosis not present

## 2021-11-30 DIAGNOSIS — R600 Localized edema: Secondary | ICD-10-CM | POA: Diagnosis not present

## 2021-11-30 DIAGNOSIS — E1165 Type 2 diabetes mellitus with hyperglycemia: Secondary | ICD-10-CM | POA: Diagnosis present

## 2021-11-30 DIAGNOSIS — K219 Gastro-esophageal reflux disease without esophagitis: Secondary | ICD-10-CM | POA: Diagnosis present

## 2021-11-30 DIAGNOSIS — Z20822 Contact with and (suspected) exposure to covid-19: Secondary | ICD-10-CM | POA: Diagnosis present

## 2021-11-30 DIAGNOSIS — Z9049 Acquired absence of other specified parts of digestive tract: Secondary | ICD-10-CM | POA: Diagnosis not present

## 2021-11-30 LAB — BASIC METABOLIC PANEL
Anion gap: 9 (ref 5–15)
BUN: 11 mg/dL (ref 8–23)
CO2: 24 mmol/L (ref 22–32)
Calcium: 9.2 mg/dL (ref 8.9–10.3)
Chloride: 97 mmol/L — ABNORMAL LOW (ref 98–111)
Creatinine, Ser: 0.7 mg/dL (ref 0.44–1.00)
GFR, Estimated: >60 mL/min (ref >60)
Glucose, Bld: 145 mg/dL — ABNORMAL HIGH (ref 70–99)
Potassium: 3.5 mmol/L (ref 3.5–5.1)
Sodium: 130 mmol/L — ABNORMAL LOW (ref 135–145)

## 2021-11-30 LAB — CBC WITH DIFFERENTIAL/PLATELET
Abs Immature Granulocytes: 0.03 10*3/uL (ref 0.00–0.07)
Basophils Absolute: 0 10*3/uL (ref 0.0–0.1)
Basophils Relative: 0 %
Eosinophils Absolute: 0.1 10*3/uL (ref 0.0–0.5)
Eosinophils Relative: 1 %
HCT: 32.5 % — ABNORMAL LOW (ref 36.0–46.0)
Hemoglobin: 10.9 g/dL — ABNORMAL LOW (ref 12.0–15.0)
Immature Granulocytes: 0 %
Lymphocytes Relative: 15 %
Lymphs Abs: 1.2 10*3/uL (ref 0.7–4.0)
MCH: 34.3 pg — ABNORMAL HIGH (ref 26.0–34.0)
MCHC: 33.5 g/dL (ref 30.0–36.0)
MCV: 102.2 fL — ABNORMAL HIGH (ref 80.0–100.0)
Monocytes Absolute: 1 10*3/uL (ref 0.1–1.0)
Monocytes Relative: 12 %
Neutro Abs: 5.8 10*3/uL (ref 1.7–7.7)
Neutrophils Relative %: 72 %
Platelets: 264 10*3/uL (ref 150–400)
RBC: 3.18 MIL/uL — ABNORMAL LOW (ref 3.87–5.11)
RDW: 10.8 % — ABNORMAL LOW (ref 11.5–15.5)
WBC: 8.1 10*3/uL (ref 4.0–10.5)
nRBC: 0 % (ref 0.0–0.2)

## 2021-11-30 LAB — GLUCOSE, CAPILLARY
Glucose-Capillary: 116 mg/dL — ABNORMAL HIGH (ref 70–99)
Glucose-Capillary: 124 mg/dL — ABNORMAL HIGH (ref 70–99)
Glucose-Capillary: 145 mg/dL — ABNORMAL HIGH (ref 70–99)

## 2021-11-30 LAB — CBG MONITORING, ED: Glucose-Capillary: 137 mg/dL — ABNORMAL HIGH (ref 70–99)

## 2021-11-30 LAB — RESP PANEL BY RT-PCR (FLU A&B, COVID) ARPGX2
Influenza A by PCR: NEGATIVE
Influenza B by PCR: NEGATIVE
SARS Coronavirus 2 by RT PCR: NEGATIVE

## 2021-11-30 LAB — LACTIC ACID, PLASMA: Lactic Acid, Venous: 1.1 mmol/L (ref 0.5–1.9)

## 2021-11-30 MED ORDER — INSULIN ASPART 100 UNIT/ML IJ SOLN
0.0000 [IU] | Freq: Three times a day (TID) | INTRAMUSCULAR | Status: DC
  Administered 2021-11-30: 17:00:00 2 [IU] via SUBCUTANEOUS
  Administered 2021-12-01: 17:00:00 5 [IU] via SUBCUTANEOUS
  Administered 2021-12-02 – 2021-12-05 (×6): 2 [IU] via SUBCUTANEOUS
  Filled 2021-11-30 (×8): qty 1

## 2021-11-30 MED ORDER — ALBUTEROL SULFATE (2.5 MG/3ML) 0.083% IN NEBU: 2.5000 mg | INHALATION_SOLUTION | RESPIRATORY_TRACT | Status: DC | PRN

## 2021-11-30 MED ORDER — PANTOPRAZOLE SODIUM 40 MG PO TBEC
40.0000 mg | DELAYED_RELEASE_TABLET | Freq: Every day | ORAL | Status: DC
  Administered 2021-11-30 – 2021-12-05 (×6): 40 mg via ORAL
  Filled 2021-11-30 (×6): qty 1

## 2021-11-30 MED ORDER — SIMETHICONE 125 MG PO CAPS: 125.0000 mg | ORAL_CAPSULE | Freq: Every day | ORAL | Status: DC | PRN

## 2021-11-30 MED ORDER — ROSUVASTATIN CALCIUM 10 MG PO TABS
40.0000 mg | ORAL_TABLET | Freq: Every day | ORAL | Status: DC
  Administered 2021-11-30 – 2021-12-05 (×5): 40 mg via ORAL
  Filled 2021-11-30 (×5): qty 4

## 2021-11-30 MED ORDER — CLINDAMYCIN PHOSPHATE 600 MG/50ML IV SOLN: 600.0000 mg | Freq: Three times a day (TID) | INTRAVENOUS | Status: DC

## 2021-11-30 MED ORDER — ASCORBIC ACID 500 MG PO TABS
1000.0000 mg | ORAL_TABLET | Freq: Every day | ORAL | Status: DC
  Administered 2021-11-30 – 2021-12-05 (×5): 1000 mg via ORAL
  Filled 2021-11-30 (×5): qty 2

## 2021-11-30 MED ORDER — METOPROLOL TARTRATE 25 MG PO TABS
12.5000 mg | ORAL_TABLET | Freq: Two times a day (BID) | ORAL | Status: DC
  Administered 2021-11-30 – 2021-12-05 (×10): 12.5 mg via ORAL
  Filled 2021-11-30 (×10): qty 1

## 2021-11-30 MED ORDER — ACETAMINOPHEN 325 MG PO TABS
650.0000 mg | ORAL_TABLET | Freq: Four times a day (QID) | ORAL | Status: DC | PRN
  Administered 2021-11-30 – 2021-12-03 (×7): 650 mg via ORAL
  Filled 2021-11-30 (×7): qty 2

## 2021-11-30 MED ORDER — GUAIFENESIN ER 600 MG PO TB12
600.0000 mg | ORAL_TABLET | Freq: Every day | ORAL | Status: DC
  Administered 2021-11-30 – 2021-12-05 (×5): 600 mg via ORAL
  Filled 2021-11-30 (×5): qty 1

## 2021-11-30 MED ORDER — INSULIN ASPART 100 UNIT/ML IJ SOLN: 0.0000 [IU] | Freq: Every day | INTRAMUSCULAR | Status: DC

## 2021-11-30 MED ORDER — HYDROCODONE-ACETAMINOPHEN 5-325 MG PO TABS
1.0000 | ORAL_TABLET | Freq: Three times a day (TID) | ORAL | Status: DC | PRN
  Administered 2021-11-30 – 2021-12-01 (×3): 1 via ORAL
  Filled 2021-11-30 (×3): qty 1

## 2021-11-30 MED ORDER — ACETAMINOPHEN 650 MG RE SUPP
650.0000 mg | Freq: Four times a day (QID) | RECTAL | Status: DC | PRN
  Filled 2021-11-30: qty 1

## 2021-11-30 MED ORDER — ENOXAPARIN SODIUM 60 MG/0.6ML IJ SOSY
0.5000 mg/kg | PREFILLED_SYRINGE | INTRAMUSCULAR | Status: DC
  Administered 2021-11-30 – 2021-12-05 (×5): 52.5 mg via SUBCUTANEOUS
  Filled 2021-11-30 (×5): qty 0.6

## 2021-11-30 MED ORDER — BUPROPION HCL ER (XL) 150 MG PO TB24
150.0000 mg | ORAL_TABLET | Freq: Every day | ORAL | Status: DC
  Administered 2021-11-30 – 2021-12-05 (×5): 150 mg via ORAL
  Filled 2021-11-30 (×5): qty 1

## 2021-11-30 MED ORDER — CLINDAMYCIN PHOSPHATE 600 MG/50ML IV SOLN
600.0000 mg | Freq: Once | INTRAVENOUS | Status: AC
  Administered 2021-11-30: 02:00:00 600 mg via INTRAVENOUS
  Filled 2021-11-30: qty 50

## 2021-11-30 MED ORDER — AMITRIPTYLINE HCL 100 MG PO TABS
100.0000 mg | ORAL_TABLET | Freq: Every day | ORAL | Status: DC
  Administered 2021-11-30 – 2021-12-04 (×5): 100 mg via ORAL
  Filled 2021-11-30 (×5): qty 1

## 2021-11-30 MED ORDER — ASPIRIN EC 81 MG PO TBEC
81.0000 mg | DELAYED_RELEASE_TABLET | Freq: Every day | ORAL | Status: DC
  Administered 2021-11-30 – 2021-12-05 (×5): 81 mg via ORAL
  Filled 2021-11-30 (×5): qty 1

## 2021-11-30 MED ORDER — PIPERACILLIN-TAZOBACTAM 3.375 G IVPB 30 MIN
3.3750 g | Freq: Once | INTRAVENOUS | Status: AC
  Administered 2021-11-30: 02:00:00 3.375 g via INTRAVENOUS
  Filled 2021-11-30: qty 50

## 2021-11-30 MED ORDER — GABAPENTIN 100 MG PO CAPS
100.0000 mg | ORAL_CAPSULE | Freq: Every day | ORAL | Status: DC
  Administered 2021-11-30 – 2021-12-04 (×5): 100 mg via ORAL
  Filled 2021-11-30 (×5): qty 1

## 2021-11-30 MED ORDER — EZETIMIBE 10 MG PO TABS
10.0000 mg | ORAL_TABLET | Freq: Every day | ORAL | Status: DC
  Administered 2021-11-30 – 2021-12-05 (×5): 10 mg via ORAL
  Filled 2021-11-30 (×6): qty 1

## 2021-11-30 MED ORDER — CLONAZEPAM 0.5 MG PO TABS: 0.5000 mg | ORAL_TABLET | Freq: Two times a day (BID) | ORAL | Status: DC | PRN

## 2021-11-30 MED ORDER — FLUOXETINE HCL 20 MG PO CAPS
40.0000 mg | ORAL_CAPSULE | Freq: Every day | ORAL | Status: DC
  Administered 2021-11-30 – 2021-12-05 (×5): 40 mg via ORAL
  Filled 2021-11-30 (×7): qty 2

## 2021-11-30 MED ORDER — ONDANSETRON HCL 4 MG/2ML IJ SOLN: 4.0000 mg | Freq: Four times a day (QID) | INTRAMUSCULAR | Status: DC | PRN

## 2021-11-30 MED ORDER — SIMETHICONE 80 MG PO CHEW: 160.0000 mg | CHEWABLE_TABLET | Freq: Every day | ORAL | Status: DC | PRN

## 2021-11-30 MED ORDER — SODIUM CHLORIDE 0.9 % IV SOLN
3.0000 g | Freq: Four times a day (QID) | INTRAVENOUS | Status: DC
  Administered 2021-11-30 – 2021-12-05 (×21): 3 g via INTRAVENOUS
  Filled 2021-11-30 (×2): qty 3
  Filled 2021-11-30: qty 8
  Filled 2021-11-30: qty 3
  Filled 2021-11-30: qty 8
  Filled 2021-11-30 (×3): qty 3
  Filled 2021-11-30 (×2): qty 8
  Filled 2021-11-30 (×2): qty 3
  Filled 2021-11-30 (×2): qty 8
  Filled 2021-11-30 (×3): qty 3
  Filled 2021-11-30: qty 8
  Filled 2021-11-30 (×2): qty 3
  Filled 2021-11-30: qty 8
  Filled 2021-11-30: qty 3
  Filled 2021-11-30 (×2): qty 8

## 2021-11-30 MED ORDER — ONDANSETRON HCL 4 MG PO TABS: 4.0000 mg | ORAL_TABLET | Freq: Four times a day (QID) | ORAL | Status: DC | PRN

## 2021-11-30 MED ORDER — CYCLOBENZAPRINE HCL 10 MG PO TABS
10.0000 mg | ORAL_TABLET | Freq: Three times a day (TID) | ORAL | Status: DC | PRN
  Administered 2021-11-30: 17:00:00 10 mg via ORAL
  Filled 2021-11-30: qty 1

## 2021-11-30 NOTE — ED Provider Notes (Signed)
[facility] Emergency Department Provider Note   ____________________________________________   Event Date/Time   First MD Initiated Contact with Patient 11/30/21 0106     (approximate)  I have reviewed the triage vital signs and the nursing notes.   HISTORY  Chief Complaint Hand Pain    HPI Amanda [name] is a 70 y.o. female who returns to the ED after discharge from the hospital yesterday for right wrist/hand cellulitis from cat bite.  She was admitted from 11/26/2021-11/29/2021, treated with IV Zosyn/Clindamycin, status post ED bedside I&D with orthopedics consult.  Patient reports swelling in her right hand doubled the size compared to when she was discharged along with increased redness in her wrist.  She is right-hand dominant.  Denies systemic symptoms such as fever, chills, cough, chest pain, shortness of breath, abdominal pain, nausea or vomiting.     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 2013: nl LV sys fxn, mild MR, no evidence of pulm htn; b. TTE 8/17: EF 60-65%, no RWMA, nl LV dia fxn,  mild AS, mod AI   CAD (coronary artery disease), native coronary artery 11/26/2020   Controlled type 2 diabetes mellitus with diabetic autonomic neuropathy, without long-term current use of insulin ([place]) 08/10/2007   Qualifier: Diagnosis of  By: Council Mechanic MD, Hilaria Ota    COPD (chronic obstructive pulmonary disease) ([place]) 10/30/2016   Depression    GAD (generalized anxiety disorder)    GERD (gastroesophageal reflux disease)    Hiatal hernia with gastroesophageal reflux 1997   Hyperlipidemia    Hypertension    Osteoporosis    Tobacco abuse     Patient Active Problem List   Diagnosis Date Noted   Cellulitis of right wrist 11/30/2021   Depression    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    Cat bite    Abscess, wrist 11/26/2021   Abscess of wrist 11/26/2021   CAD (coronary artery disease),  native coronary artery 11/26/2020   ACS (acute coronary syndrome) ([place]) 11/11/2020   Nonrheumatic aortic valve stenosis    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 sting    COPD with chronic bronchitis ([place]) 10/30/2016   Gastroesophageal reflux disease 09/17/2016   Smoker 05/10/2015   Solitary pulmonary nodule 03/28/2013   Essential hypertension    Vitamin D deficiency 05/29/2009   Pure hypercholesterolemia 12/07/2007   Major depressive disorder, recurrent episode, in partial remission ([place]) 12/07/2007   Controlled type 2 diabetes mellitus with diabetic autonomic neuropathy, without long-term current use of insulin ([place]) 08/10/2007    Past Surgical History:  Procedure Laterality Date   ABDOMINAL HYSTERECTOMY  1993   ANTERIOR CERVICAL DECOMP/DISCECTOMY FUSION N/A 10/26/2018   Procedure: ACDF C4-C5 C5-C6 C6-C7;  Surgeon: Kary Kos, MD;  Location: [facility];  Service: Neurosurgery;  Laterality: N/A;   APPENDECTOMY     BACK SURGERY     BREAST BIOPSY Right 2007   benign   CHOLECYSTECTOMY     COLONOSCOPY     30 years ago was normal per pt.    DILATION AND CURETTAGE OF UTERUS     ESOPHAGEAL DILATION     x 3   fractured leg Right    LEFT HEART CATH AND CORONARY ANGIOGRAPHY N/A 11/11/2020   Procedure: LEFT HEART CATH AND CORONARY ANGIOGRAPHY;  Surgeon: Wellington Hampshire, MD;  Location: [facility];  Service: Cardiovascular;  Laterality: N/A;   OOPHORECTOMY  OVARIAN CYST REMOVAL  1972   SALIVARY GLAND SURGERY     TONSILLECTOMY     4 yoa   TOTAL KNEE ARTHROPLASTY Left 12/24/2015   Procedure: LEFT TOTAL KNEE ARTHROPLASTY;  Surgeon: [name], MD;  Location: [facility];  Service: Orthopedics;  Laterality: Left;   TOTAL KNEE ARTHROPLASTY Right 12/30/2017   Procedure: RIGHT TOTAL KNEE ARTHROPLASTY;  Surgeon: [name], MD;  Location: [facility];  Service: Orthopedics;  Laterality: Right;   WRIST FRACTURE SURGERY  11/2008    Prior to Admission medications   Medication Sig Start Date End Date  Taking? Authorizing Provider  acetaminophen (TYLENOL) 650 MG CR tablet Take 650 mg by mouth 3 times/day as needed-between meals & bedtime (for headaches).    [provider]  albuterol (PROVENTIL HFA;VENTOLIN HFA) 108 (90 Base) MCG/ACT inhaler Inhale 2 puffs into the lungs every 4 (four) hours as needed for wheezing or shortness of breath. 07/27/18   Copland, Frederico Hamman, MD  amitriptyline (ELAVIL) 50 MG tablet TAKE 2 TABLETS BY MOUTH DAILY AT BEDTIME. 07/17/21   Copland, Frederico Hamman, MD  amoxicillin-clavulanate (AUGMENTIN) 875-125 MG tablet Take 1 tablet by mouth 2 (two) times daily for 7 days. 11/29/21 12/06/21  Lorella Nimrod, MD  Ascorbic Acid (VITAMIN C) 1000 MG tablet Take 1,000 mg by mouth daily.    [provider]  aspirin EC 81 MG tablet Take 81 mg by mouth daily.    [provider]  buPROPion (WELLBUTRIN XL) 150 MG 24 hr tablet TAKE 1 TABLET (150 MG TOTAL) BY MOUTH DAILY. 07/17/21   Copland, Frederico Hamman, MD  calcium carbonate (TUMS - DOSED IN MG ELEMENTAL CALCIUM) 500 MG chewable tablet Chew 2 tablets by mouth daily as needed for indigestion or heartburn.    [provider]  Cholecalciferol (VITAMIN D) 1000 UNITS capsule Take 1,000 Units by mouth daily.     [provider]  clonazePAM (KLONOPIN) 0.5 MG tablet Take 1 tablet by mouth twice daily as needed 05/27/21   Copland, Frederico Hamman, MD  cyclobenzaprine (FLEXERIL) 10 MG tablet TAKE 1 TABLET THREE TIMES DAILY AS NEEDED FOR MUSCLE SPAMS 09/09/21   Copland, Frederico Hamman, MD  ezetimibe (ZETIA) 10 MG tablet Take 1 tablet (10 mg total) by mouth daily. 11/08/20   Wellington Hampshire, MD  FLUoxetine (PROZAC) 40 MG capsule TAKE 1 CAPSULE (40 MG TOTAL) BY MOUTH DAILY. 07/17/21   Copland, Frederico Hamman, MD  gabapentin (NEURONTIN) 100 MG capsule Take 1 capsule (100 mg total) by mouth at bedtime. 09/09/21   Copland, Frederico Hamman, MD  guaiFENesin (MUCINEX) 600 MG 12 hr tablet Take 600 mg by mouth daily.    [provider]   HYDROcodone-acetaminophen (NORCO/VICODIN) 5-325 MG tablet Take 1 tablet by mouth every 8 (eight) hours as needed for moderate pain. 07/21/21 07/21/22  Copland, Frederico Hamman, MD  metoprolol tartrate (LOPRESSOR) 25 MG tablet TAKE 1/2 TABLET TWICE DAILY 11/24/21   Wellington Hampshire, MD  Multiple Vitamin (MULTIVITAMIN) tablet Take 1 tablet by mouth daily.    [provider]  naproxen sodium (ALEVE) 220 MG tablet Take 220 mg by mouth daily.    [provider]  pantoprazole (PROTONIX) 40 MG tablet TAKE 1 TABLET 30 MINUTES PRIOR TO BREAKFAST 11/12/21   Copland, Frederico Hamman, MD  polycarbophil (FIBERCON) 625 MG tablet Take 625 mg by mouth daily as needed. FIBER THERAPY Patient not taking: Reported on 11/26/2021    [provider]  rosuvastatin (CRESTOR) 40 MG tablet TAKE 1 TABLET EVERY DAY 09/24/21   Copland,  Frederico Hamman, MD  Simethicone 125 MG CAPS Take 125 mg by mouth daily as needed (gas).    [provider]  vitamin E 180 MG (400 UNITS) capsule Take 400 Units by mouth daily.    [provider]    Allergies Honey bee venom [bee venom] and Diazepam  Family History  Problem Relation Age of Onset   Hyperlipidemia Mother    Arthritis Mother    Diabetes Mother    Heart disease Mother    Colon polyps Mother    Cancer Paternal Grandmother        breast   Hypercholesterolemia Sister    Colon polyps Sister    Breast cancer Sister    Colon cancer Neg Hx    Esophageal cancer Neg Hx    Rectal cancer Neg Hx    Stomach cancer Neg Hx    Pancreatic cancer Neg Hx     Social History Social History   Tobacco Use   Smoking status: Every Day    Packs/day: 0.75    Years: 45.00    Pack years: 33.75    Types: Cigarettes   Smokeless tobacco: Never   Tobacco comments:    Smoking half PPD but is trying to quit.   Vaping Use   Vaping Use: Never used  Substance Use Topics   Alcohol use: Yes    Alcohol/week: 0.0 standard drinks    Comment: glass of wine twice a month     Drug use: No    Review of Systems  Constitutional: No fever/chills Eyes: No visual changes. ENT: No sore throat. Cardiovascular: Denies chest pain. Respiratory: Denies shortness of breath. Gastrointestinal: No abdominal pain.  No nausea, no vomiting.  No diarrhea.  No constipation. Genitourinary: Negative for dysuria. Musculoskeletal: Positive for right hand/wrist redness and swelling.  Negative for back pain. Skin: Negative for rash. Neurological: Negative for headaches, focal weakness or numbness.   ____________________________________________   PHYSICAL EXAM:  VITAL SIGNS: ED Triage Vitals  Enc Vitals Group     BP 11/29/21 2103 (!) 149/67     Pulse Rate 11/29/21 2103 92     Resp 11/29/21 2103 18     Temp 11/29/21 2103 98.5 F (36.9 C)     Temp Source 11/29/21 2103 Oral     SpO2 11/29/21 2103 100 %     Weight 11/29/21 2102 235 lb (106.6 kg)     Height 11/29/21 2102 5\' 8"  (1.727 m)     Head Circumference --      Peak Flow --      Pain Score 11/29/21 2102 9     Pain Loc --      Pain Edu? --      Excl. in [place]? --     Constitutional: Alert and oriented.  Elderly appearing and in mild acute distress. Eyes: Conjunctivae are normal. PERRL. EOMI. Head: Atraumatic. Nose: No congestion/rhinnorhea. Mouth/Throat: Mucous membranes are moist.   Neck: No stridor.   Cardiovascular: Normal rate, regular rhythm. Grossly normal heart sounds.  Good peripheral circulation. Respiratory: Normal respiratory effort.  No retractions. Lungs CTAB. Gastrointestinal: Soft and nontender. No distention. No abdominal bruits. No CVA tenderness. Musculoskeletal:  RUE: Moderate swelling to right dorsal hand, area of warmth/erythema/possible fluctuance to medial dorsal wrist.  Limited range of motion making fist secondary to pain.  2+ radial pulses.  Brisk, less than 5-second capillary refill. No lower extremity tenderness nor edema.  No joint effusions. Neurologic:  Normal speech and language. No  gross focal  neurologic deficits are appreciated. No gait instability. Skin:  Skin is warm, dry and intact. No rash noted. Psychiatric: Mood and affect are normal. Speech and behavior are normal.  ____________________________________________   LABS (all labs ordered are listed, but only abnormal results are displayed)  Labs Reviewed  CBC WITH DIFFERENTIAL/PLATELET - Abnormal; Notable for the following components:      Result Value   RBC 3.18 (*)    Hemoglobin 10.9 (*)    HCT 32.5 (*)    MCV 102.2 (*)    MCH 34.3 (*)    RDW 10.8 (*)    All other components within normal limits  BASIC METABOLIC PANEL - Abnormal; Notable for the following components:   Sodium 130 (*)    Chloride 97 (*)    Glucose, Bld 145 (*)    All other components within normal limits  RESP PANEL BY RT-PCR (FLU A&B, COVID) ARPGX2  CULTURE, BLOOD (ROUTINE X 2)  CULTURE, BLOOD (ROUTINE X 2)  LACTIC ACID, PLASMA  LACTIC ACID, PLASMA   ____________________________________________  EKG  None ____________________________________________  RADIOLOGY I, Adoria Kawamoto J, personally viewed and evaluated these images (plain radiographs) as part of my medical decision making, as well as reviewing the written report by the radiologist.  ED MD interpretation: X-ray demonstrates no osteomyelitis; ultrasound demonstrates no abscess  Official radiology report(s): DG Wrist Complete Right  Result Date: 11/30/2021 CLINICAL DATA:  Right wrist pain.  Cat bite. EXAM: RIGHT WRIST - COMPLETE 3+ VIEW COMPARISON:  Right hand radiograph dated 11/26/2021. FINDINGS: There is no acute fracture or dislocation. The bones are osteopenic. Degenerative changes of the base of the thumb. There is soft tissue swelling primarily involving the dorsum of the hand and wrist. No radiopaque foreign object or soft tissue gas. IMPRESSION: 1. No acute fracture or dislocation. 2. Soft tissue swelling. Electronically Signed   By: Anner Crete M.D.   On:  11/30/2021 01:52   [place] RT UPPER EXTREM LTD SOFT TISSUE NON VASCULAR  Result Date: 11/30/2021 CLINICAL DATA:  Right wrist swelling after cat scratch. EXAM: ULTRASOUND right UPPER EXTREMITY LIMITED TECHNIQUE: Ultrasound examination of the upper extremity soft tissues was performed in the area of clinical concern. COMPARISON:  Right wrist radiograph dated 11/30/2021 FINDINGS: There is diffuse edema and hyperemia in the superficial soft tissues which may represent cellulitis. No drainable fluid collection/abscess. IMPRESSION: Diffuse soft tissue edema.  No abscess. Electronically Signed   By: Anner Crete M.D.   On: 11/30/2021 02:17    ____________________________________________   PROCEDURES  Procedure(s) performed (including Critical Care):  Procedures   ____________________________________________   INITIAL IMPRESSION / ASSESSMENT AND PLAN / ED COURSE  As part of my medical decision making, I reviewed the following data within the [facility] notes reviewed and incorporated, Labs reviewed, Old chart reviewed (hospitalization 12/21-12/24/2022), Radiograph reviewed, Discussed with admitting physician, and Notes from prior ED visits     70 year old female returning to the hospital status post recent hospitalization and discharged yesterday for right hand cellulitis/abscess.  Differential diagnosis includes but is not limited to worsening cellulitis, abscess, osteomyelitis, etc.  Will repeat blood work, ultrasound, plain film x-rays.  Anticipate hospitalization.  Clinical Course as of 11/30/21 7846  Nancy Fetter Nov 30, 2021  0245 Discussed case with hospitalist services for admission. [JS]    Clinical Course User Index [JS] Paulette Blanch, MD     ____________________________________________   FINAL CLINICAL IMPRESSION(S) / ED DIAGNOSES  Final diagnoses:  Swelling  Cellulitis of  right hand     ED Discharge Orders     None        Note:  This document  was prepared using Dragon voice recognition software and may include unintentional dictation errors.    Paulette Blanch, MD 11/30/21 [phone]

## 2021-11-30 NOTE — ED Notes (Signed)
Blanket & pillow provided upon patient request

## 2021-11-30 NOTE — Progress Notes (Signed)
°  Transition of Care [facility]) Screening Note   Patient Details  Name: Amanda [name] Date of Birth: [birth year]   [facility]) CM/SW Contact:    Alberteen Sam, LCSW Phone Number: 11/30/2021, 12:52 PM    Transition of Care Department [facility]) has reviewed patient and no TOC needs have been identified at this time. We will continue to monitor patient advancement through interdisciplinary progression rounds. If new patient transition needs arise, please place a TOC consult.

## 2021-11-30 NOTE — Progress Notes (Signed)
Pharmacy Antibiotic Note  Amanda [name] is a 70 y.o. female admitted on 11/30/2021 with Cellulitis.  Pharmacy has been consulted for Unasyn dosing.  Plan: Unasyn 3 gm IV Q6H ordered to continue on 12/25 @ 0800.   Height: 5\' 8"  (172.7 cm) Weight: 106.6 kg (235 lb) IBW/kg (Calculated) : 63.9  Temp (24hrs), Avg:98.3 F (36.8 C), Min:97.7 F (36.5 C), Max:98.5 F (36.9 C)  Recent Labs  Lab 11/26/21 1623 11/28/21 0456 11/28/21 0857 11/29/21 0314 11/30/21 0132  WBC 8.9  --  6.6 8.1 8.1  CREATININE 0.69 0.57  --  0.54 0.70  LATICACIDVEN 0.9  --   --   --  1.1    Estimated Creatinine Clearance: 82.5 mL/min (by C-G formula based on SCr of 0.7 mg/dL).    Allergies  Allergen Reactions   Honey Bee Venom [Bee Venom] Anaphylaxis   Diazepam Other (See Comments)    REACTION: pt states she gets very angry and abusive verbally on this medication     Antimicrobials this admission:   >>    >>   Dose adjustments this admission:   Microbiology results:  BCx:   UCx:    Sputum:    MRSA PCR:   Thank you for allowing pharmacy to be a part of this patients care.  Sera Hitsman D 11/30/2021 3:50 AM

## 2021-11-30 NOTE — Progress Notes (Signed)
Pt seen and examined at bedside.  Amanda [name] is a 70 y.o. female with medical history significant for DM, HTN, CAD, COPD, hospitalized from 12/21-12/24 with abscess of the right wrist secondary to infected cat bite with positive blood cultures for Pasteurella, treated with  I&D on 12/21, Zosyn->Unasyn x3 days and discharged on Augmentin x7 days who returns to the ED with increased pain swelling and redness of the right wrist .  She was started on IV Unasyn. Reports the pain and swelling are improving with IV antibiotics.  Continue the same.  Pt admitted earlier this am by Dr Damita Dunnings, please see note for detailed H&P.    Hosie Poisson, MD

## 2021-11-30 NOTE — Progress Notes (Signed)
Anticoagulation monitoring(Lovenox):  70 yo female ordered Lovenox 40 mg Q24h    Filed Weights   11/29/21 2102  Weight: 106.6 kg (235 lb)   BMI 35.7    Lab Results  Component Value Date   CREATININE 0.70 11/30/2021   CREATININE 0.54 11/29/2021   CREATININE 0.57 11/28/2021   Estimated Creatinine Clearance: 82.5 mL/min (by C-G formula based on SCr of 0.7 mg/dL). Hemoglobin & Hematocrit     Component Value Date/Time   HGB 10.9 (L) 11/30/2021 0132   HCT 32.5 (L) 11/30/2021 0132     Per Protocol for Patient with estCrcl > 30 ml/min and BMI > 30, will transition to Lovenox 52.5 mg Q24h.

## 2021-11-30 NOTE — H&P (Signed)
History and Physical    Amanda [name] BJY:[id number] DOB: [birth year] DOA: 11/30/2021  PCP: Owens Loffler, MD   Patient coming from: home  I have personally briefly reviewed patient's relevant medical records in [place]  Chief Complaint: right wrist pain   HPI: Amanda [name] is a 70 y.o. female with medical history significant for DM, HTN, CAD, COPD, hospitalized from 12/21-12/24 with abscess of the right wrist secondary to infected cat bite with positive blood cultures for Pasteurella, treated with  I&D on 12/21, Zosyn->Unasyn x3 days and discharged on Augmentin x7 days who returns to the ED with increased pain swelling and redness of the right wrist .  She states the swelling doubled in size compared to when she was discharged.  She denies fever or chills.  ED course: BP 149/69 with otherwise normal vitals Blood work: WBC 8.1 with lactic acid 1.1 Hemoglobin 10.9 Sodium 130  Imaging: Ultrasound right hand with diffuse soft tissue edema no abscess  Patient started on clindamycin and Zosyn.  Hospitalist consulted for admission.     Review of Systems: As per HPI otherwise all other systems on review of systems negative.   Assessment/Plan    Cellulitis of right wrist -No evidence of abscess - Continue Unasyn for treatment of Pasteurella until more sustained improvement - Consider ID consult    Controlled type 2 diabetes mellitus with diabetic autonomic neuropathy, without long-term current use of insulin (HCC) - Sliding scale insulin coverage    Essential hypertension - Continue metoprolol    COPD with chronic bronchitis (HCC) - Not acutely exacerbated - Ventolin as needed    Nonrheumatic aortic valve stenosis - No acute issues    CAD (coronary artery disease), native coronary artery - No complaints of chest pain - Continue aspirin, metoprolol, rosuvastatin    Depression - Continue bupropion, Klonopin, Prozac   DVT prophylaxis: Lovenox  Code  Status: full code  Family Communication:  none  Disposition Plan: Back to previous home environment Consults called: none  Status:At the time of admission, it appears that the appropriate admission status for this patient is INPATIENT. This is judged to be reasonable and necessary in order to provide the required intensity of service to ensure the patient's safety given the presenting symptoms, physical exam findings, and initial radiographic and laboratory data in the context of their  Comorbid conditions.   Patient requires inpatient status due to high intensity of service, high risk for further deterioration and high frequency of surveillance required.   I certify that at the point of admission it is my clinical judgment that the patient will require inpatient hospital care spanning beyond 2 midnights     Physical Exam: Vitals:   11/30/21 0145 11/30/21 0200 11/30/21 0311 11/30/21 0330  BP: 139/62 107/63 135/62 135/71  Pulse: 89 87 86 87  Resp: 18  18   Temp:      TempSrc:      SpO2: 97% 100% 100% 100%  Weight:      Height:       Constitutional: Alert, oriented x 3 . Not in any apparent distress HEENT:      Head: Normocephalic and atraumatic.         Eyes: PERLA, EOMI, Conjunctivae are normal. Sclera is non-icteric.       Mouth/Throat: Mucous membranes are moist.       Neck: Supple with no signs of meningismus. Cardiovascular: Regular rate and rhythm. No murmurs, gallops, or rubs. 2+ symmetrical distal pulses are  present . No JVD. No  LE edema Respiratory: Respiratory effort normal .Lungs sounds clear bilaterally. No wheezes, crackles, or rhonchi.  Gastrointestinal: Soft, non tender, non distended. Positive bowel sounds.  Genitourinary: No CVA tenderness. Musculoskeletal: Pain redness and swelling right wrist Neurologic:  Face is symmetric. Moving all extremities. No gross focal neurologic deficits . Skin: Skin is warm, dry.  No rash or ulcers Psychiatric: Mood and affect are  appropriate     Past Medical History:  Diagnosis Date   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 2013: nl LV sys fxn, mild MR, no evidence of pulm htn; b. TTE 8/17: EF 60-65%, no RWMA, nl LV dia fxn,  mild AS, mod AI   CAD (coronary artery disease), native coronary artery 11/26/2020   Controlled type 2 diabetes mellitus with diabetic autonomic neuropathy, without long-term current use of insulin ([place]) 08/10/2007   Qualifier: Diagnosis of  By: Council Mechanic MD, Hilaria Ota    COPD (chronic obstructive pulmonary disease) ([place]) 10/30/2016   Depression    GAD (generalized anxiety disorder)    GERD (gastroesophageal reflux disease)    Hiatal hernia with gastroesophageal reflux 1997   Hyperlipidemia    Hypertension    Osteoporosis    Tobacco abuse     Past Surgical History:  Procedure Laterality Date   ABDOMINAL HYSTERECTOMY  1993   ANTERIOR CERVICAL DECOMP/DISCECTOMY FUSION N/A 10/26/2018   Procedure: ACDF C4-C5 C5-C6 C6-C7;  Surgeon: Kary Kos, MD;  Location: [facility];  Service: Neurosurgery;  Laterality: N/A;   APPENDECTOMY     BACK SURGERY     BREAST BIOPSY Right 2007   benign   CHOLECYSTECTOMY     COLONOSCOPY     30 years ago was normal per pt.    DILATION AND CURETTAGE OF UTERUS     ESOPHAGEAL DILATION     x 3   fractured leg Right    LEFT HEART CATH AND CORONARY ANGIOGRAPHY N/A 11/11/2020   Procedure: LEFT HEART CATH AND CORONARY ANGIOGRAPHY;  Surgeon: Wellington Hampshire, MD;  Location: [facility];  Service: Cardiovascular;  Laterality: N/A;   OOPHORECTOMY     OVARIAN CYST REMOVAL  1972   SALIVARY GLAND SURGERY     TONSILLECTOMY     4 yoa   TOTAL KNEE ARTHROPLASTY Left 12/24/2015   Procedure: LEFT TOTAL KNEE ARTHROPLASTY;  Surgeon: [name], MD;  Location: [facility];  Service: Orthopedics;  Laterality: Left;   TOTAL KNEE ARTHROPLASTY Right 12/30/2017   Procedure: RIGHT TOTAL KNEE ARTHROPLASTY;  Surgeon: [name], MD;  Location: [facility];  Service: Orthopedics;  Laterality: Right;   WRIST FRACTURE SURGERY  11/2008     reports that she has been smoking cigarettes. She has a 33.75 pack-year smoking history. She has never used smokeless tobacco. She reports current alcohol use. She reports that she does not use drugs.  Allergies  Allergen Reactions   Honey Bee Venom [Bee Venom] Anaphylaxis   Diazepam Other (See Comments)    REACTION: pt states she gets very angry and abusive verbally on this medication     Family History  Problem Relation Age of Onset   Hyperlipidemia Mother    Arthritis Mother    Diabetes Mother    Heart disease Mother    Colon polyps Mother    Cancer Paternal Grandmother        breast   Hypercholesterolemia Sister    Colon  polyps Sister    Breast cancer Sister    Colon cancer Neg Hx    Esophageal cancer Neg Hx    Rectal cancer Neg Hx    Stomach cancer Neg Hx    Pancreatic cancer Neg Hx       Prior to Admission medications   Medication Sig Start Date End Date Taking? Authorizing Provider  acetaminophen (TYLENOL) 650 MG CR tablet Take 650 mg by mouth 3 times/day as needed-between meals & bedtime (for headaches).    [provider]  albuterol (PROVENTIL HFA;VENTOLIN HFA) 108 (90 Base) MCG/ACT inhaler Inhale 2 puffs into the lungs every 4 (four) hours as needed for wheezing or shortness of breath. 07/27/18   Copland, Frederico Hamman, MD  amitriptyline (ELAVIL) 50 MG tablet TAKE 2 TABLETS BY MOUTH DAILY AT BEDTIME. 07/17/21   Copland, Frederico Hamman, MD  amoxicillin-clavulanate (AUGMENTIN) 875-125 MG tablet Take 1 tablet by mouth 2 (two) times daily for 7 days. 11/29/21 12/06/21  Lorella Nimrod, MD  Ascorbic Acid (VITAMIN C) 1000 MG tablet Take 1,000 mg by mouth daily.    [provider]  aspirin EC 81 MG tablet Take 81 mg by mouth daily.    [provider]  buPROPion (WELLBUTRIN XL) 150 MG 24 hr tablet TAKE 1 TABLET (150 MG TOTAL) BY MOUTH DAILY. 07/17/21   Copland, Frederico Hamman, MD   calcium carbonate (TUMS - DOSED IN MG ELEMENTAL CALCIUM) 500 MG chewable tablet Chew 2 tablets by mouth daily as needed for indigestion or heartburn.    [provider]  Cholecalciferol (VITAMIN D) 1000 UNITS capsule Take 1,000 Units by mouth daily.     [provider]  clonazePAM (KLONOPIN) 0.5 MG tablet Take 1 tablet by mouth twice daily as needed 05/27/21   Copland, Frederico Hamman, MD  cyclobenzaprine (FLEXERIL) 10 MG tablet TAKE 1 TABLET THREE TIMES DAILY AS NEEDED FOR MUSCLE SPAMS 09/09/21   Copland, Frederico Hamman, MD  ezetimibe (ZETIA) 10 MG tablet Take 1 tablet (10 mg total) by mouth daily. 11/08/20   Wellington Hampshire, MD  FLUoxetine (PROZAC) 40 MG capsule TAKE 1 CAPSULE (40 MG TOTAL) BY MOUTH DAILY. 07/17/21   Copland, Frederico Hamman, MD  gabapentin (NEURONTIN) 100 MG capsule Take 1 capsule (100 mg total) by mouth at bedtime. 09/09/21   Copland, Frederico Hamman, MD  guaiFENesin (MUCINEX) 600 MG 12 hr tablet Take 600 mg by mouth daily.    [provider]  HYDROcodone-acetaminophen (NORCO/VICODIN) 5-325 MG tablet Take 1 tablet by mouth every 8 (eight) hours as needed for moderate pain. 07/21/21 07/21/22  Copland, Frederico Hamman, MD  metoprolol tartrate (LOPRESSOR) 25 MG tablet TAKE 1/2 TABLET TWICE DAILY 11/24/21   Wellington Hampshire, MD  Multiple Vitamin (MULTIVITAMIN) tablet Take 1 tablet by mouth daily.    [provider]  naproxen sodium (ALEVE) 220 MG tablet Take 220 mg by mouth daily.    [provider]  pantoprazole (PROTONIX) 40 MG tablet TAKE 1 TABLET 30 MINUTES PRIOR TO BREAKFAST 11/12/21   Copland, Frederico Hamman, MD  polycarbophil (FIBERCON) 625 MG tablet Take 625 mg by mouth daily as needed. FIBER THERAPY Patient not taking: Reported on 11/26/2021    [provider]  rosuvastatin (CRESTOR) 40 MG tablet TAKE 1 TABLET EVERY DAY 09/24/21   Copland, Frederico Hamman, MD  Simethicone 125 MG CAPS Take 125 mg by mouth daily as needed (gas).    [provider]  vitamin E 180 MG (400  UNITS) capsule Take 400 Units by mouth daily.    [provider]  Labs on Admission: I have personally reviewed following labs and imaging studies  CBC: Recent Labs  Lab 11/26/21 1623 11/28/21 0857 11/29/21 0314 11/30/21 0132  WBC 8.9 6.6 8.1 8.1  NEUTROABS 7.0  --   --  5.8  HGB 12.0 10.9* 11.4* 10.9*  HCT 35.6* 32.3* 34.0* 32.5*  MCV 101.4* 99.1 100.0 102.2*  PLT 201 218 236 244   Basic Metabolic Panel: Recent Labs  Lab 11/26/21 1623 11/28/21 0456 11/29/21 0314 11/30/21 0132  NA 133*  --  135 130*  K 3.4*  --  3.7 3.5  CL 99  --  104 97*  CO2 26  --  25 24  GLUCOSE 153*  --  151* 145*  BUN 11  --  9 11  CREATININE 0.69 0.57 0.54 0.70  CALCIUM 9.4  --  8.8* 9.2   GFR: Estimated Creatinine Clearance: 82.5 mL/min (by C-G formula based on SCr of 0.7 mg/dL). Liver Function Tests: Recent Labs  Lab 11/26/21 1623 11/29/21 0314  AST 24 32  ALT 20 40  ALKPHOS 48 78  BILITOT 0.8 0.7  PROT 7.3 6.3*  ALBUMIN 3.7 3.0*   No results for input(s): LIPASE, AMYLASE in the last 168 hours. No results for input(s): AMMONIA in the last 168 hours. Coagulation Profile: No results for input(s): INR, PROTIME in the last 168 hours. Cardiac Enzymes: No results for input(s): CKTOTAL, CKMB, CKMBINDEX, TROPONINI in the last 168 hours. BNP (last 3 results) No results for input(s): PROBNP in the last 8760 hours. HbA1C: No results for input(s): HGBA1C in the last 72 hours. CBG: Recent Labs  Lab 11/27/21 0757 11/27/21 1150 11/27/21 1620 11/27/21 2242 11/28/21 0804  GLUCAP 138* 145* 145* 85 122*   Lipid Profile: No results for input(s): CHOL, HDL, LDLCALC, TRIG, CHOLHDL, LDLDIRECT in the last 72 hours. Thyroid Function Tests: No results for input(s): TSH, T4TOTAL, FREET4, T3FREE, THYROIDAB in the last 72 hours. Anemia Panel: No results for input(s): VITAMINB12, FOLATE, FERRITIN, TIBC, IRON, RETICCTPCT in the last 72 hours. Urine analysis:    Component Value  Date/Time   COLORURINE YELLOW (A) 10/24/2019 0559   APPEARANCEUR CLEAR (A) 10/24/2019 0559   LABSPEC 1.009 10/24/2019 0559   PHURINE 7.0 10/24/2019 0559   GLUCOSEU NEGATIVE 10/24/2019 0559   HGBUR SMALL (A) 10/24/2019 0559   BILIRUBINUR NEGATIVE 10/24/2019 0559   KETONESUR NEGATIVE 10/24/2019 0559   PROTEINUR NEGATIVE 10/24/2019 0559   NITRITE NEGATIVE 10/24/2019 0559   LEUKOCYTESUR MODERATE (A) 10/24/2019 0559    Radiological Exams on Admission: DG Wrist Complete Right  Result Date: 11/30/2021 CLINICAL DATA:  Right wrist pain.  Cat bite. EXAM: RIGHT WRIST - COMPLETE 3+ VIEW COMPARISON:  Right hand radiograph dated 11/26/2021. FINDINGS: There is no acute fracture or dislocation. The bones are osteopenic. Degenerative changes of the base of the thumb. There is soft tissue swelling primarily involving the dorsum of the hand and wrist. No radiopaque foreign object or soft tissue gas. IMPRESSION: 1. No acute fracture or dislocation. 2. Soft tissue swelling. Electronically Signed   By: Anner Crete M.D.   On: 11/30/2021 01:52   [place] RT UPPER EXTREM LTD SOFT TISSUE NON VASCULAR  Result Date: 11/30/2021 CLINICAL DATA:  Right wrist swelling after cat scratch. EXAM: ULTRASOUND right UPPER EXTREMITY LIMITED TECHNIQUE: Ultrasound examination of the upper extremity soft tissues was performed in the area of clinical concern. COMPARISON:  Right wrist radiograph dated 11/30/2021 FINDINGS: There is diffuse edema and hyperemia in the superficial soft tissues which may  represent cellulitis. No drainable fluid collection/abscess. IMPRESSION: Diffuse soft tissue edema.  No abscess. Electronically Signed   By: Anner Crete M.D.   On: 11/30/2021 02:17       Athena Masse MD [facility]   11/30/2021, 3:42 AM

## 2021-12-01 DIAGNOSIS — L03119 Cellulitis of unspecified part of limb: Secondary | ICD-10-CM | POA: Diagnosis not present

## 2021-12-01 DIAGNOSIS — R7881 Bacteremia: Secondary | ICD-10-CM | POA: Diagnosis not present

## 2021-12-01 DIAGNOSIS — L02519 Cutaneous abscess of unspecified hand: Secondary | ICD-10-CM

## 2021-12-01 DIAGNOSIS — J449 Chronic obstructive pulmonary disease, unspecified: Secondary | ICD-10-CM | POA: Diagnosis not present

## 2021-12-01 DIAGNOSIS — E1143 Type 2 diabetes mellitus with diabetic autonomic (poly)neuropathy: Secondary | ICD-10-CM | POA: Diagnosis not present

## 2021-12-01 DIAGNOSIS — L03113 Cellulitis of right upper limb: Secondary | ICD-10-CM | POA: Diagnosis not present

## 2021-12-01 DIAGNOSIS — W5501XD Bitten by cat, subsequent encounter: Secondary | ICD-10-CM

## 2021-12-01 LAB — CBC
HCT: 32 % — ABNORMAL LOW (ref 36.0–46.0)
Hemoglobin: 10.4 g/dL — ABNORMAL LOW (ref 12.0–15.0)
MCH: 32.7 pg (ref 26.0–34.0)
MCHC: 32.5 g/dL (ref 30.0–36.0)
MCV: 100.6 fL — ABNORMAL HIGH (ref 80.0–100.0)
Platelets: 260 10*3/uL (ref 150–400)
RBC: 3.18 MIL/uL — ABNORMAL LOW (ref 3.87–5.11)
RDW: 11 % — ABNORMAL LOW (ref 11.5–15.5)
WBC: 5.7 10*3/uL (ref 4.0–10.5)
nRBC: 0 % (ref 0.0–0.2)

## 2021-12-01 LAB — GLUCOSE, CAPILLARY
Glucose-Capillary: 224 mg/dL — ABNORMAL HIGH (ref 70–99)
Glucose-Capillary: 114 mg/dL — ABNORMAL HIGH (ref 70–99)

## 2021-12-01 LAB — BASIC METABOLIC PANEL
Anion gap: 5 (ref 5–15)
BUN: 8 mg/dL (ref 8–23)
CO2: 25 mmol/L (ref 22–32)
Calcium: 8.5 mg/dL — ABNORMAL LOW (ref 8.9–10.3)
Chloride: 107 mmol/L (ref 98–111)
Creatinine, Ser: 0.74 mg/dL (ref 0.44–1.00)
GFR, Estimated: >60 mL/min (ref >60)
Glucose, Bld: 136 mg/dL — ABNORMAL HIGH (ref 70–99)
Potassium: 3.5 mmol/L (ref 3.5–5.1)
Sodium: 137 mmol/L (ref 135–145)

## 2021-12-01 MED ORDER — SODIUM CHLORIDE 0.9 % IV SOLN: INTRAVENOUS | Status: DC | PRN

## 2021-12-01 MED ORDER — HYDROCODONE-ACETAMINOPHEN 5-325 MG PO TABS
1.0000 | ORAL_TABLET | Freq: Three times a day (TID) | ORAL | Status: DC | PRN
Start: 2021-12-01 — End: 2021-12-03
  Administered 2021-12-01: 15:00:00 2 via ORAL
  Administered 2021-12-01: 23:00:00 1 via ORAL
  Administered 2021-12-02: 20:00:00 2 via ORAL
  Administered 2021-12-02: 09:00:00 1 via ORAL
  Administered 2021-12-03: 08:00:00 2 via ORAL
  Filled 2021-12-01 (×2): qty 2
  Filled 2021-12-01: qty 1
  Filled 2021-12-01: qty 2
  Filled 2021-12-01: qty 1

## 2021-12-01 NOTE — Consult Note (Signed)
NAME: Amanda [name]  DOB: September 23, 1951  MRN: [id number]  Date/Time: 12/01/2021 6:01 PM  REQUESTING PROVIDER: Karleen Hampshire Subjective:  REASON FOR CONSULT: Pasteurella bacteremia ? Amanda [name] is a 70 y.o. with a history of HTN, aortic stenosis, non obstructive CAD, COPD, Diet controlled DM, was recently hospitalized between 12/21-12/24 for rt hand/wrist pain and swelling from a cat bite . There was an abscess on the wrist area and it was I/D in the ED. That grew pasteurella She had pasteurella bacteremia and after being on IV zosyn was discharged on PO augmentin.  She returned to the ED on 12/25 with worsening swelling and pain and redness of the rt wrist area and dorsum hand and restricted movt of the wrist No fever Temp 97.9, BP 149/51, HR 81 WBC 8.1, HB 10.9, PLT 264, Cr 0.70 Blood culture sent Xray of rt wrist no acute findings  She was seen by Dr.Poggi who is planning to take her to OR tomorrow  Pt has 68 month old kitten who has scratched her and nipped her many times that she does not remember when this injury was sustained. The kitten is fully vaccinated  She is followed by Dr.Arida for aortic stenosis and CAD She is to have a 2 d echo on 12/05/21    Past Medical History:  Diagnosis Date   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 2013: nl LV sys fxn, mild MR, no evidence of pulm htn; b. TTE 8/17: EF 60-65%, no RWMA, nl LV dia fxn,  mild AS, mod AI   CAD (coronary artery disease), native coronary artery 11/26/2020   Controlled type 2 diabetes mellitus with diabetic autonomic neuropathy, without long-term current use of insulin ([place]) 08/10/2007   Qualifier: Diagnosis of  By: Council Mechanic MD, Hilaria Ota    COPD (chronic obstructive pulmonary disease) ([place]) 10/30/2016   Depression    GAD (generalized anxiety disorder)    GERD (gastroesophageal reflux disease)    Hiatal hernia with gastroesophageal reflux 1997   Hyperlipidemia    Hypertension    Osteoporosis     Tobacco abuse     Past Surgical History:  Procedure Laterality Date   ABDOMINAL HYSTERECTOMY  1993   ANTERIOR CERVICAL DECOMP/DISCECTOMY FUSION N/A 10/26/2018   Procedure: ACDF C4-C5 C5-C6 C6-C7;  Surgeon: Kary Kos, MD;  Location: [facility];  Service: Neurosurgery;  Laterality: N/A;   APPENDECTOMY     BACK SURGERY     BREAST BIOPSY Right 2007   benign   CHOLECYSTECTOMY     COLONOSCOPY     30 years ago was normal per pt.    DILATION AND CURETTAGE OF UTERUS     ESOPHAGEAL DILATION     x 3   fractured leg Right    LEFT HEART CATH AND CORONARY ANGIOGRAPHY N/A 11/11/2020   Procedure: LEFT HEART CATH AND CORONARY ANGIOGRAPHY;  Surgeon: Wellington Hampshire, MD;  Location: [facility];  Service: Cardiovascular;  Laterality: N/A;   OOPHORECTOMY     OVARIAN CYST REMOVAL  1972   SALIVARY GLAND SURGERY     TONSILLECTOMY     4 yoa   TOTAL KNEE ARTHROPLASTY Left 12/24/2015   Procedure: LEFT TOTAL KNEE ARTHROPLASTY;  Surgeon: [name], MD;  Location: [facility];  Service: Orthopedics;  Laterality: Left;   TOTAL KNEE ARTHROPLASTY Right 12/30/2017   Procedure: RIGHT TOTAL KNEE ARTHROPLASTY;  Surgeon: [name], MD;  Location: [facility];  Service: Orthopedics;  Laterality: Right;   WRIST FRACTURE SURGERY  11/2008    Social History   Socioeconomic History   Marital status: Married    Spouse name: Not on file   Number of children: 2   Years of education: Not on file   Highest education level: Not on file  Occupational History   Occupation: [place]    Employer: [employer]  Tobacco Use   Smoking status: Every Day    Packs/day: 0.75    Years: 45.00    Pack years: 33.75    Types: Cigarettes   Smokeless tobacco: Never   Tobacco comments:    Smoking half PPD but is trying to quit.   Vaping Use   Vaping Use: Never used  Substance and Sexual Activity   Alcohol use: Yes    Alcohol/week: 0.0 standard drinks    Comment: glass of wine  twice a month    Drug use: No   Sexual activity: Not Currently  Other Topics Concern   Not on file  Social History Narrative   Not on file   Social Determinants of Health   Financial Resource Strain: Not on file  Food Insecurity: Not on file  Transportation Needs: Not on file  Physical Activity: Not on file  Stress: Not on file  Social Connections: Not on file  Intimate Partner Violence: Not on file    Family History  Problem Relation Age of Onset   Hyperlipidemia Mother    Arthritis Mother    Diabetes Mother    Heart disease Mother    Colon polyps Mother    Cancer Paternal Grandmother        breast   Hypercholesterolemia Sister    Colon polyps Sister    Breast cancer Sister    Colon cancer Neg Hx    Esophageal cancer Neg Hx    Rectal cancer Neg Hx    Stomach cancer Neg Hx    Pancreatic cancer Neg Hx    Allergies  Allergen Reactions   Honey Bee Venom [Bee Venom] Anaphylaxis   Diazepam Other (See Comments)    REACTION: pt states she gets very angry and abusive verbally on this medication    I? Current Facility-Administered Medications  Medication Dose Route Frequency Provider Last Rate Last Admin   acetaminophen (TYLENOL) tablet 650 mg  650 mg Oral Q6H PRN Athena Masse, MD   650 mg at 11/30/21 1645   Or   acetaminophen (TYLENOL) suppository 650 mg  650 mg Rectal Q6H PRN Athena Masse, MD       albuterol (PROVENTIL) (2.5 MG/3ML) 0.083% nebulizer solution 2.5 mg  2.5 mg Inhalation Q4H PRN Athena Masse, MD       amitriptyline (ELAVIL) tablet 100 mg  100 mg Oral QHS Hosie Poisson, MD   100 mg at 11/30/21 2157   Ampicillin-Sulbactam (UNASYN) 3 g in sodium chloride 0.9 % 100 mL IVPB  3 g Intravenous Q6H Judd Gaudier V, MD 200 mL/hr at 12/01/21 1731 3 g at 12/01/21 1731   ascorbic acid (VITAMIN C) tablet 1,000 mg  1,000 mg Oral Daily Hosie Poisson, MD   1,000 mg at 12/01/21 3785   aspirin EC tablet 81 mg  81 mg Oral Daily Hosie Poisson, MD   81 mg at 12/01/21  8850   buPROPion (WELLBUTRIN XL) 24 hr tablet 150 mg  150 mg Oral Daily Hosie Poisson, MD   150 mg at 12/01/21 0838   clonazePAM (KLONOPIN)  tablet 0.5 mg  0.5 mg Oral BID PRN Hosie Poisson, MD       cyclobenzaprine (FLEXERIL) tablet 10 mg  10 mg Oral TID PRN Hosie Poisson, MD   10 mg at 11/30/21 1645   enoxaparin (LOVENOX) injection 52.5 mg  0.5 mg/kg Subcutaneous Q24H Judd Gaudier V, MD   52.5 mg at 12/01/21 0846   ezetimibe (ZETIA) tablet 10 mg  10 mg Oral Daily Hosie Poisson, MD   10 mg at 12/01/21 0837   FLUoxetine (PROZAC) capsule 40 mg  40 mg Oral Daily Hosie Poisson, MD   40 mg at 12/01/21 0837   gabapentin (NEURONTIN) capsule 100 mg  100 mg Oral QHS Hosie Poisson, MD   100 mg at 11/30/21 2159   guaiFENesin (MUCINEX) 12 hr tablet 600 mg  600 mg Oral Daily Hosie Poisson, MD   600 mg at 12/01/21 0354   HYDROcodone-acetaminophen (NORCO/VICODIN) 5-325 MG per tablet 1-2 tablet  1-2 tablet Oral Q8H PRN Hosie Poisson, MD   2 tablet at 12/01/21 1450   insulin aspart (novoLOG) injection 0-15 Units  0-15 Units Subcutaneous TID WC Athena Masse, MD   5 Units at 12/01/21 1728   insulin aspart (novoLOG) injection 0-5 Units  0-5 Units Subcutaneous QHS Athena Masse, MD       metoprolol tartrate (LOPRESSOR) tablet 12.5 mg  12.5 mg Oral BID Hosie Poisson, MD   12.5 mg at 12/01/21 0838   ondansetron (ZOFRAN) tablet 4 mg  4 mg Oral Q6H PRN Athena Masse, MD       Or   ondansetron [facility]) injection 4 mg  4 mg Intravenous Q6H PRN Athena Masse, MD       pantoprazole (PROTONIX) EC tablet 40 mg  40 mg Oral Daily Hosie Poisson, MD   40 mg at 12/01/21 6568   rosuvastatin (CRESTOR) tablet 40 mg  40 mg Oral Daily Hosie Poisson, MD   40 mg at 12/01/21 1275   simethicone (MYLICON) chewable tablet 160 mg  160 mg Oral Daily PRN Hosie Poisson, MD         Abtx:  Anti-infectives (From admission, onward)    Start     Dose/Rate Route Frequency Ordered Stop   11/30/21 0600  clindamycin (CLEOCIN) IVPB 600 mg   Status:  Discontinued        600 mg 100 mL/hr over 30 Minutes Intravenous Every 8 hours 11/30/21 0324 11/30/21 0339   11/30/21 0400  Ampicillin-Sulbactam (UNASYN) 3 g in sodium chloride 0.9 % 100 mL IVPB        3 g 200 mL/hr over 30 Minutes Intravenous Every 6 hours 11/30/21 0350     11/30/21 0130  piperacillin-tazobactam (ZOSYN) IVPB 3.375 g        3.375 g 100 mL/hr over 30 Minutes Intravenous  Once 11/30/21 0123 11/30/21 0227   11/30/21 0130  clindamycin (CLEOCIN) IVPB 600 mg        600 mg 100 mL/hr over 30 Minutes Intravenous  Once 11/30/21 0123 11/30/21 0245       REVIEW OF SYSTEMS:  Const: negative fever, negative chills, negative weight loss Eyes: negative diplopia or visual changes, negative eye pain ENT: negative coryza, negative sore throat Resp: negative cough, hemoptysis, has dyspnea Cards: negative for chest pain, palpitations, lower extremity edema GU: negative for frequency, dysuria and hematuria GI: Negative for abdominal pain, diarrhea, bleeding, constipation Skin: as above Heme: negative for easy bruising and gum/nose bleeding MS: pain and swelling rt wrist and  hand with decreased mobility Neurolo:negative for headaches, dizziness, vertigo, memory problems  Psych: negative for feelings of anxiety, depression  Endocrine:diet controlleddiabetes Allergy/Immunology- as above Objective:  VITALS:  BP (!) 111/54 (BP Location: Left Arm)    Pulse 80    Temp 98.1 F (36.7 C) (Oral)    Resp 16    Ht 5\' 8"  (1.727 m)    Wt 106.6 kg    SpO2 96%    BMI 35.73 kg/m  PHYSICAL EXAM:  General: Alert, cooperative,some distress on moving her hand , appears stated age.  Head: Normocephalic, without obvious abnormality, atraumatic. Eyes: Conjunctivae clear, anicteric sclerae. Pupils are equal ENT Nares normal. No drainage or sinus tenderness. Lips, mucosa, and tongue normal. No Thrush edentulous Neck: Supple, symmetrical, no adenopathy, thyroid: non tender no carotid bruit and  no JVD. Back: No CVA tenderness. Lungs: Clear to auscultation bilaterally. No Wheezing or Rhonchi. No rales. Heart: Regular rate and rhythm,systolic murmur  Abdomen: Soft, non-tender,not distended. Bowel sounds normal. No masses Extremities: rt wrist lateral aspect redness and a fluctuant abscess. Some swelling dorsum of the hand  12/01/21    11/27/21   Skin: No rashes or lesions. Or bruising Lymph: Cervical, supraclavicular normal. Neurologic: Grossly non-focal Pertinent Labs Lab Results CBC    Component Value Date/Time   WBC 5.7 12/01/2021 0732   RBC 3.18 (L) 12/01/2021 0732   HGB 10.4 (L) 12/01/2021 0732   HCT 32.0 (L) 12/01/2021 0732   PLT 260 12/01/2021 0732   MCV 100.6 (H) 12/01/2021 0732   MCH 32.7 12/01/2021 0732   MCHC 32.5 12/01/2021 0732   RDW 11.0 (L) 12/01/2021 0732   LYMPHSABS 1.2 11/30/2021 0132   MONOABS 1.0 11/30/2021 0132   EOSABS 0.1 11/30/2021 0132   BASOSABS 0.0 11/30/2021 0132    CMP Latest Ref Rng & Units 12/01/2021 11/30/2021 11/29/2021  Glucose 70 - 99 mg/dL 136(H) 145(H) 151(H)  BUN 8 - 23 mg/dL 8 11 9   Creatinine 0.44 - 1.00 mg/dL 0.74 0.70 0.54  Sodium 135 - 145 mmol/L 137 130(L) 135  Potassium 3.5 - 5.1 mmol/L 3.5 3.5 3.7  Chloride 98 - 111 mmol/L 107 97(L) 104  CO2 22 - 32 mmol/L 25 24 25   Calcium 8.9 - 10.3 mg/dL 8.5(L) 9.2 8.8(L)  Total Protein 6.5 - 8.1 g/dL - - 6.3(L)  Total Bilirubin 0.3 - 1.2 mg/dL - - 0.7  Alkaline Phos 38 - 126 U/L - - 78  AST 15 - 41 U/L - - 32  ALT 0 - 44 U/L - - 40      Microbiology: Recent Results (from the past 240 hour(s))  Culture, blood (routine x 2)     Status: Abnormal   Collection Time: 11/26/21  4:23 PM   Specimen: BLOOD  Result Value Ref Range Status   Specimen Description   Final    BLOOD BLLA Performed at [facility], [address], [place], [place] [address]    Special Requests   Final    BOTTLES DRAWN AEROBIC AND ANAEROBIC BCHV Performed at [facility], [address], [place], [place] [address]    Culture  Setup Time   Final    GRAM NEGATIVE RODS AEROBIC BOTTLE ONLY CRITICAL VALUE NOTED.  VALUE IS CONSISTENT WITH PREVIOUSLY REPORTED AND CALLED VALUE. Performed at [facility], [address] [place], [place] [address]    Culture (A)  Final    PASTEURELLA MULTOCIDA Usually susceptible to penicillin and other beta lactam agents,quinolones,macrolides and  tetracyclines. Performed at [facility], [place] [address] [place], [place] [address]    Report Status 11/29/2021 FINAL  Final  Culture, blood (routine x 2)     Status: Abnormal   Collection Time: 11/26/21  4:23 PM   Specimen: BLOOD  Result Value Ref Range Status   Specimen Description   Final    BLOOD LAC Performed at [facility], [address] [place], [place] [address]    Special Requests   Final    BOTTLES DRAWN AEROBIC AND ANAEROBIC BCAV Performed at [facility], [address], [place], [place] [address]    Culture  Setup Time   Final    GRAM NEGATIVE RODS AEROBIC BOTTLE ONLY Organism ID to follow CRITICAL RESULT CALLED TO, READ BACK BY AND VERIFIED WITHNilsa Nutting [id number] 11/27/21 HNM Performed at [facility], [address] [place], [place] [address]    Culture (A)  Final    PASTEURELLA MULTOCIDA Usually susceptible to penicillin and other beta lactam agents,quinolones,macrolides and tetracyclines. Performed at [facility], [place] [address] [place], [place] [address]    Report Status 11/29/2021 FINAL  Final  Blood Culture ID Panel (Reflexed)     Status: None   Collection Time: 11/26/21  4:23 PM  Result Value Ref Range Status   Enterococcus faecalis NOT DETECTED NOT DETECTED Final   Enterococcus Faecium NOT DETECTED NOT DETECTED Final   Listeria monocytogenes NOT DETECTED NOT DETECTED Final   Staphylococcus species NOT DETECTED NOT DETECTED Final   Staphylococcus aureus (BCID) NOT DETECTED NOT DETECTED  Final   Staphylococcus epidermidis NOT DETECTED NOT DETECTED Final   Staphylococcus lugdunensis NOT DETECTED NOT DETECTED Final   Streptococcus species NOT DETECTED NOT DETECTED Final   Streptococcus agalactiae NOT DETECTED NOT DETECTED Final   Streptococcus pneumoniae NOT DETECTED NOT DETECTED Final   Streptococcus pyogenes NOT DETECTED NOT DETECTED Final   A.calcoaceticus-baumannii NOT DETECTED NOT DETECTED Final   Bacteroides fragilis NOT DETECTED NOT DETECTED Final   Enterobacterales NOT DETECTED NOT DETECTED Final   Enterobacter cloacae complex NOT DETECTED NOT DETECTED Final   Escherichia coli NOT DETECTED NOT DETECTED Final   Klebsiella aerogenes NOT DETECTED NOT DETECTED Final   Klebsiella oxytoca NOT DETECTED NOT DETECTED Final   Klebsiella pneumoniae NOT DETECTED NOT DETECTED Final   Proteus species NOT DETECTED NOT DETECTED Final   Salmonella species NOT DETECTED NOT DETECTED Final   Serratia marcescens NOT DETECTED NOT DETECTED Final   Haemophilus influenzae NOT DETECTED NOT DETECTED Final   Neisseria meningitidis NOT DETECTED NOT DETECTED Final   Pseudomonas aeruginosa NOT DETECTED NOT DETECTED Final   Stenotrophomonas maltophilia NOT DETECTED NOT DETECTED Final   Candida albicans NOT DETECTED NOT DETECTED Final   Candida auris NOT DETECTED NOT DETECTED Final   Candida glabrata NOT DETECTED NOT DETECTED Final   Candida krusei NOT DETECTED NOT DETECTED Final   Candida parapsilosis NOT DETECTED NOT DETECTED Final   Candida tropicalis NOT DETECTED NOT DETECTED Final   Cryptococcus neoformans/gattii NOT DETECTED NOT DETECTED Final   CTX-M ESBL NOT DETECTED NOT DETECTED Final   Carbapenem resistance IMP NOT DETECTED NOT DETECTED Final   Carbapenem resistance KPC NOT DETECTED NOT DETECTED Final   Methicillin resistance mecA/C NOT DETECTED NOT DETECTED Final   Meth resistant mecA/C and MREJ NOT DETECTED NOT DETECTED Final   Carbapenem resistance NDM NOT DETECTED NOT DETECTED  Final   Carbapenem resist OXA 48 LIKE NOT DETECTED NOT DETECTED Final  Vancomycin resistance NOT DETECTED NOT DETECTED Final   Carbapenem resistance VIM NOT DETECTED NOT DETECTED Final    Comment: Performed at [facility], [address] [place], [place] [address]  Aerobic Culture w Gram Stain (superficial specimen)     Status: None   Collection Time: 11/26/21  8:24 PM   Specimen: Abscess  Result Value Ref Range Status   Specimen Description   Final    ABSCESS Performed at [facility], [address], [place], [place] [address]    Special Requests   Final    NONE Performed at [facility], [address] [place], [place] [address]    Gram Stain   Final    ABUNDANT WBC PRESENT, PREDOMINANTLY PMN NO ORGANISMS SEEN    Culture   Final    RARE PASTEURELLA MULTOCIDA Usually susceptible to penicillin and other beta lactam agents,quinolones,macrolides and tetracyclines. Performed at [facility], [place] [address] [place], [place] [address]    Report Status 11/28/2021 FINAL  Final  Resp Panel by RT-PCR (Flu A&B, Covid) Nasopharyngeal Swab     Status: None   Collection Time: 11/26/21  8:26 PM   Specimen: Nasopharyngeal Swab; Nasopharyngeal(NP) swabs in vial transport medium  Result Value Ref Range Status   SARS Coronavirus 2 by RT PCR NEGATIVE NEGATIVE Final    Comment: (NOTE) SARS-CoV-2 target nucleic acids are NOT DETECTED.  The SARS-CoV-2 RNA is generally detectable in upper respiratory specimens during the acute phase of infection. The lowest concentration of SARS-CoV-2 viral copies this assay can detect is 138 copies/mL. A negative result does not preclude SARS-Cov-2 infection and should not be used as the sole basis for treatment or other patient management decisions. A negative result may occur with  improper specimen collection/handling, submission of specimen other than nasopharyngeal swab, presence of viral mutation(s) within  the areas targeted by this assay, and inadequate number of viral copies(<138 copies/mL). A negative result must be combined with clinical observations, patient history, and epidemiological information. The expected result is Negative.  Fact Sheet for Patients:  EntrepreneurPulse.com.au  Fact Sheet for Healthcare Providers:  IncredibleEmployment.be  This test is no t yet approved or cleared by the [place] FDA and  has been authorized for detection and/or diagnosis of SARS-CoV-2 by FDA under an Emergency Use Authorization (EUA). This EUA will remain  in effect (meaning this test can be used) for the duration of the COVID-19 declaration under Section 564(b)(1) of the Act, 21 U.S.C.section 360bbb-3(b)(1), unless the authorization is terminated  or revoked sooner.       Influenza A by PCR NEGATIVE NEGATIVE Final   Influenza B by PCR NEGATIVE NEGATIVE Final    Comment: (NOTE) The Xpert Xpress SARS-CoV-2/FLU/RSV plus assay is intended as an aid in the diagnosis of influenza from Nasopharyngeal swab specimens and should not be used as a sole basis for treatment. Nasal washings and aspirates are unacceptable for Xpert Xpress SARS-CoV-2/FLU/RSV testing.  Fact Sheet for Patients: EntrepreneurPulse.com.au  Fact Sheet for Healthcare Providers: IncredibleEmployment.be  This test is not yet approved or cleared by the [place] FDA and has been authorized for detection and/or diagnosis of SARS-CoV-2 by FDA under an Emergency Use Authorization (EUA). This EUA will remain in effect (meaning this test can be used) for the duration of the COVID-19 declaration under Section 564(b)(1) of the Act, 21 U.S.C. section 360bbb-3(b)(1), unless the authorization is terminated or revoked.  Performed at [facility], [place], [place]  [address]   Culture, blood (routine x 2)     Status: None  (Preliminary result)   Collection Time: 11/30/21  1:32 AM   Specimen: BLOOD  Result Value Ref Range Status   Specimen Description BLOOD BLOOD LEFT FOREARM  Final   Special Requests   Final    BOTTLES DRAWN AEROBIC AND ANAEROBIC Blood Culture adequate volume   Culture   Final    NO GROWTH 1 DAY Performed at [facility], [address] [place], [place] [address]    Report Status PENDING  Incomplete  Resp Panel by RT-PCR (Flu A&B, Covid) Nasopharyngeal Swab     Status: None   Collection Time: 11/30/21  3:11 AM   Specimen: Nasopharyngeal Swab; Nasopharyngeal(NP) swabs in vial transport medium  Result Value Ref Range Status   SARS Coronavirus 2 by RT PCR NEGATIVE NEGATIVE Final    Comment: (NOTE) SARS-CoV-2 target nucleic acids are NOT DETECTED.  The SARS-CoV-2 RNA is generally detectable in upper respiratory specimens during the acute phase of infection. The lowest concentration of SARS-CoV-2 viral copies this assay can detect is 138 copies/mL. A negative result does not preclude SARS-Cov-2 infection and should not be used as the sole basis for treatment or other patient management decisions. A negative result may occur with  improper specimen collection/handling, submission of specimen other than nasopharyngeal swab, presence of viral mutation(s) within the areas targeted by this assay, and inadequate number of viral copies(<138 copies/mL). A negative result must be combined with clinical observations, patient history, and epidemiological information. The expected result is Negative.  Fact Sheet for Patients:  EntrepreneurPulse.com.au  Fact Sheet for Healthcare Providers:  IncredibleEmployment.be  This test is no t yet approved or cleared by the [place] FDA and  has been authorized for detection and/or diagnosis of SARS-CoV-2 by FDA under an Emergency Use Authorization (EUA). This EUA will remain  in effect (meaning this test  can be used) for the duration of the COVID-19 declaration under Section 564(b)(1) of the Act, 21 U.S.C.section 360bbb-3(b)(1), unless the authorization is terminated  or revoked sooner.       Influenza A by PCR NEGATIVE NEGATIVE Final   Influenza B by PCR NEGATIVE NEGATIVE Final    Comment: (NOTE) The Xpert Xpress SARS-CoV-2/FLU/RSV plus assay is intended as an aid in the diagnosis of influenza from Nasopharyngeal swab specimens and should not be used as a sole basis for treatment. Nasal washings and aspirates are unacceptable for Xpert Xpress SARS-CoV-2/FLU/RSV testing.  Fact Sheet for Patients: EntrepreneurPulse.com.au  Fact Sheet for Healthcare Providers: IncredibleEmployment.be  This test is not yet approved or cleared by the [place] FDA and has been authorized for detection and/or diagnosis of SARS-CoV-2 by FDA under an Emergency Use Authorization (EUA). This EUA will remain in effect (meaning this test can be used) for the duration of the COVID-19 declaration under Section 564(b)(1) of the Act, 21 U.S.C. section 360bbb-3(b)(1), unless the authorization is terminated or revoked.  Performed at [facility], [address] [place], [place] [address]   Culture, blood (routine x 2)     Status: None (Preliminary result)   Collection Time: 11/30/21  6:35 AM   Specimen: BLOOD LEFT HAND  Result Value Ref Range Status   Specimen Description BLOOD LEFT HAND  Final   Special Requests   Final    BOTTLES DRAWN AEROBIC AND ANAEROBIC Blood Culture adequate volume   Culture   Final    NO GROWTH 1 DAY Performed at [facility], [address], [place], [place] [address]    Report Status PENDING  Incomplete    IMAGING RESULTS: CT from 12/21 Peripherally enhancing fluid collection about the dorsal/ulnar aspect of the wrist measuring at least 2.3 x 0.8 x 1.4 cm concerning for abscess. Clinical correlation is suggested. 2.  Generalized subcutaneous soft tissue edema and skin thickening of the wrist and forearm. 3. No acute osseous abnormality. I have personally reviewed the films ? Impression/Recommendation ? ?Cat bite wound with pasteurella bacteremia and pasteurella wound  On unasyn Awaiting I/D and debridement tomorrow for abscess and tenosynovitis Continue unasyn Because of aortic stenosis, will check 2 d echo ?will let Dr.Arida know  Aortic stenosis and non obstructive CAD- followed by cardiology as OP- on rosuvastatin,   DM- diet controlled currently  H/O B/l TKA  H/o Lumbar surgery H/o Cervical surgery with hardware- one of the screws is loose she says, chronic neck pain , no recent  worsening  _____  GAD  ____________________________________________ Discussed with patient, requesting provider Note:  This document was prepared using Dragon voice recognition software and may include unintentional dictation errors.

## 2021-12-01 NOTE — Consult Note (Signed)
ORTHOPAEDIC CONSULTATION  REQUESTING PHYSICIAN: Hosie Poisson, MD  Chief Complaint:   Erythema, pain, and swelling of right wrist.  History of Present Illness: Amanda [name] is a 70 y.o. female with multiple medical problems including hypertension, hyperlipidemia, gastroesophageal reflux disease, COPD, coronary artery disease, diabetes, and anxiety/depression who lives independently with her husband had been admitted 5 days ago for a cat bite infection to the ulnar aspect of her right wrist after being bit by a cat 5 to 7 days prior to admission.  The patient's dorsal ulnar wrist region was I&D under local in the emergency room and purulent material was expressed.  These cultures grew out Pasteurella Multoceda.  The patient subsequently was admitted and started on IV antibiotics.  The patient appeared to be improving and so was discharged home on the afternoon of 12/24 on oral antibiotics.  However, the patient notes that shortly after getting home, her hand began to swell worse so she returned to the emergency room and was readmitted yesterday for further IV antibiotics and possible formal irrigation and debridement of the wrist.  The patient notes some stiffness of her fingers and wrist which she attributes to the swelling, but denies any numbness or paresthesias to her fingers.  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 2013: nl LV sys fxn, mild MR, no evidence of pulm htn; b. TTE 8/17: EF 60-65%, no RWMA, nl LV dia fxn,  mild AS, mod AI   CAD (coronary artery disease), native coronary artery 11/26/2020   Controlled type 2 diabetes mellitus with diabetic autonomic neuropathy, without long-term current use of insulin ([place]) 08/10/2007   Qualifier: Diagnosis of  By: Council Mechanic MD, Hilaria Ota    COPD (chronic obstructive pulmonary disease) ([place]) 10/30/2016   Depression    GAD  (generalized anxiety disorder)    GERD (gastroesophageal reflux disease)    Hiatal hernia with gastroesophageal reflux 1997   Hyperlipidemia    Hypertension    Osteoporosis    Tobacco abuse    Past Surgical History:  Procedure Laterality Date   ABDOMINAL HYSTERECTOMY  1993   ANTERIOR CERVICAL DECOMP/DISCECTOMY FUSION N/A 10/26/2018   Procedure: ACDF C4-C5 C5-C6 C6-C7;  Surgeon: Kary Kos, MD;  Location: [facility];  Service: Neurosurgery;  Laterality: N/A;   APPENDECTOMY     BACK SURGERY     BREAST BIOPSY Right 2007   benign   CHOLECYSTECTOMY     COLONOSCOPY     30 years ago was normal per pt.    DILATION AND CURETTAGE OF UTERUS     ESOPHAGEAL DILATION     x 3   fractured leg Right    LEFT HEART CATH AND CORONARY ANGIOGRAPHY N/A 11/11/2020   Procedure: LEFT HEART CATH AND CORONARY ANGIOGRAPHY;  Surgeon: Wellington Hampshire, MD;  Location: [facility];  Service: Cardiovascular;  Laterality: N/A;   OOPHORECTOMY     OVARIAN CYST REMOVAL  1972   SALIVARY GLAND SURGERY     TONSILLECTOMY     4 yoa   TOTAL KNEE ARTHROPLASTY Left 12/24/2015   Procedure: LEFT TOTAL KNEE ARTHROPLASTY;  Surgeon: [name], MD;  Location: [facility];  Service: Orthopedics;  Laterality: Left;   TOTAL KNEE ARTHROPLASTY Right 12/30/2017   Procedure: RIGHT TOTAL KNEE ARTHROPLASTY;  Surgeon: [name], MD;  Location: [facility];  Service: Orthopedics;  Laterality: Right;   WRIST FRACTURE SURGERY  11/2008   Social History   Socioeconomic History   Marital status: Married    Spouse name: Not on file   Number of children: 2   Years of education: Not on file   Highest education level: Not on file  Occupational History   Occupation: [place]    Employer: [employer]  Tobacco Use   Smoking status: Every Day    Packs/day: 0.75    Years: 45.00    Pack years: 33.75    Types: Cigarettes   Smokeless tobacco: Never   Tobacco comments:    Smoking half PPD but  is trying to quit.   Vaping Use   Vaping Use: Never used  Substance and Sexual Activity   Alcohol use: Yes    Alcohol/week: 0.0 standard drinks    Comment: glass of wine twice a month    Drug use: No   Sexual activity: Not Currently  Other Topics Concern   Not on file  Social History Narrative   Not on file   Social Determinants of Health   Financial Resource Strain: Not on file  Food Insecurity: Not on file  Transportation Needs: Not on file  Physical Activity: Not on file  Stress: Not on file  Social Connections: Not on file   Family History  Problem Relation Age of Onset   Hyperlipidemia Mother    Arthritis Mother    Diabetes Mother    Heart disease Mother    Colon polyps Mother    Cancer Paternal Grandmother        breast   Hypercholesterolemia Sister    Colon polyps Sister    Breast cancer Sister    Colon cancer Neg Hx    Esophageal cancer Neg Hx    Rectal cancer Neg Hx    Stomach cancer Neg Hx    Pancreatic cancer Neg Hx    Allergies  Allergen Reactions   Honey Bee Venom [Bee Venom] Anaphylaxis   Diazepam Other (See Comments)    REACTION: pt states she gets very angry and abusive verbally on this medication    Prior to Admission medications   Medication Sig Start Date End Date Taking? Authorizing Provider  acetaminophen (TYLENOL) 650 MG CR tablet Take 650 mg by mouth 3 times/day as needed-between meals & bedtime (for headaches).   Yes [provider]  amoxicillin-clavulanate (AUGMENTIN) 875-125 MG tablet Take 1 tablet by mouth 2 (two) times daily for 7 days. 11/29/21 12/06/21 Yes Lorella Nimrod, MD  Ascorbic Acid (VITAMIN C) 1000 MG tablet Take 1,000 mg by mouth daily.   Yes [provider]  aspirin EC 81 MG tablet Take 81 mg by mouth daily.   Yes [provider]  buPROPion (WELLBUTRIN XL) 150 MG 24 hr tablet TAKE 1 TABLET (150 MG TOTAL) BY MOUTH DAILY. 07/17/21  Yes Copland, Frederico Hamman, MD  Cholecalciferol (VITAMIN D) 1000 UNITS  capsule Take 1,000 Units by mouth daily.    Yes [provider]  clonazePAM (KLONOPIN) 0.5 MG tablet Take 1 tablet by mouth twice daily as needed 05/27/21  Yes Copland, Frederico Hamman, MD  ezetimibe (ZETIA) 10 MG tablet Take 1 tablet (10 mg total) by mouth daily. 11/08/20  Yes Wellington Hampshire, MD  FLUoxetine (PROZAC) 40 MG capsule TAKE 1 CAPSULE (40 MG TOTAL) BY MOUTH DAILY. 07/17/21  Yes Copland, Frederico Hamman, MD  gabapentin (NEURONTIN) 100 MG capsule Take 1 capsule (100 mg total) by mouth at bedtime. 09/09/21  Yes Copland, Frederico Hamman, MD  metoprolol tartrate (  LOPRESSOR) 25 MG tablet TAKE 1/2 TABLET TWICE DAILY 11/24/21  Yes Wellington Hampshire, MD  Multiple Vitamin (MULTIVITAMIN) tablet Take 1 tablet by mouth daily.   Yes [provider]  pantoprazole (PROTONIX) 40 MG tablet TAKE 1 TABLET 30 MINUTES PRIOR TO BREAKFAST 11/12/21  Yes Copland, Spencer, MD  vitamin E 180 MG (400 UNITS) capsule Take 400 Units by mouth daily.   Yes [provider]  albuterol (PROVENTIL HFA;VENTOLIN HFA) 108 (90 Base) MCG/ACT inhaler Inhale 2 puffs into the lungs every 4 (four) hours as needed for wheezing or shortness of breath. 07/27/18   Copland, Frederico Hamman, MD  amitriptyline (ELAVIL) 50 MG tablet TAKE 2 TABLETS BY MOUTH DAILY AT BEDTIME. 07/17/21   Copland, Frederico Hamman, MD  calcium carbonate (TUMS - DOSED IN MG ELEMENTAL CALCIUM) 500 MG chewable tablet Chew 2 tablets by mouth daily as needed for indigestion or heartburn.    [provider]  cyclobenzaprine (FLEXERIL) 10 MG tablet TAKE 1 TABLET THREE TIMES DAILY AS NEEDED FOR MUSCLE SPAMS 09/09/21   Copland, Frederico Hamman, MD  guaiFENesin (MUCINEX) 600 MG 12 hr tablet Take 600 mg by mouth daily.    [provider]  HYDROcodone-acetaminophen (NORCO/VICODIN) 5-325 MG tablet Take 1 tablet by mouth every 8 (eight) hours as needed for moderate pain. Patient not taking: Reported on 11/30/2021 07/21/21 07/21/22  Owens Loffler, MD  naproxen sodium (ALEVE) 220 MG  tablet Take 220 mg by mouth daily.    [provider]  polycarbophil (FIBERCON) 625 MG tablet Take 625 mg by mouth daily as needed. FIBER THERAPY Patient not taking: Reported on 11/26/2021    [provider]  rosuvastatin (CRESTOR) 40 MG tablet TAKE 1 TABLET EVERY DAY 09/24/21   Copland, Frederico Hamman, MD  Simethicone 125 MG CAPS Take 125 mg by mouth daily as needed (gas).    [provider]   DG Wrist Complete Right  Result Date: 11/30/2021 CLINICAL DATA:  Right wrist pain.  Cat bite. EXAM: RIGHT WRIST - COMPLETE 3+ VIEW COMPARISON:  Right hand radiograph dated 11/26/2021. FINDINGS: There is no acute fracture or dislocation. The bones are osteopenic. Degenerative changes of the base of the thumb. There is soft tissue swelling primarily involving the dorsum of the hand and wrist. No radiopaque foreign object or soft tissue gas. IMPRESSION: 1. No acute fracture or dislocation. 2. Soft tissue swelling. Electronically Signed   By: Anner Crete M.D.   On: 11/30/2021 01:52   [place] RT UPPER EXTREM LTD SOFT TISSUE NON VASCULAR  Result Date: 11/30/2021 CLINICAL DATA:  Right wrist swelling after cat scratch. EXAM: ULTRASOUND right UPPER EXTREMITY LIMITED TECHNIQUE: Ultrasound examination of the upper extremity soft tissues was performed in the area of clinical concern. COMPARISON:  Right wrist radiograph dated 11/30/2021 FINDINGS: There is diffuse edema and hyperemia in the superficial soft tissues which may represent cellulitis. No drainable fluid collection/abscess. IMPRESSION: Diffuse soft tissue edema.  No abscess. Electronically Signed   By: Anner Crete M.D.   On: 11/30/2021 02:17    Positive ROS: All other systems have been reviewed and were otherwise negative with the exception of those mentioned in the HPI and as above.  Physical Exam: General:  Alert, no acute distress Psychiatric:  Patient is competent for consent with normal mood and affect   Cardiovascular:  No  pedal edema Respiratory:  No wheezing, non-labored breathing GI:  Abdomen is soft and non-tender Skin:  No lesions in the area of chief complaint Neurologic:  Sensation intact distally Lymphatic:  No axillary or cervical lymphadenopathy  Orthopedic Exam:  Orthopedic examination is limited to the right wrist and hand.  There is an area of swelling and erythema with a central area of blanching over the dorsal ulnar aspect of the wrist.  The area of erythema measures approximately 3 cm in diameter and appears to be slightly increased as compared to examination on 11/29/21.  There is mild-moderate tenderness palpation over this area.  The bite mark and subsequent stab incision for the I&D itself appears to have scabbed over.  There is moderate swelling extending to her hand and into the fingers to the level of the PIP joints.  She is able to active flex and extend all digits, although motion is limited due to some discomfort and swelling.  She also is able to flex and extend her wrist, although again motion is limited due to pain and swelling.  Actively, she is able to extend her wrist to 15 degrees and flex her wrist to 20 degrees.  No obvious effusion is noted, especially dorsally or radially.  She has intact sensation to light touch to all digits and has good capillary refill to all digits.  X-rays:  Recent AP, lateral, and oblique views of the right wrist are available for review and have been reviewed by myself.  These films demonstrate generalized osteopenia as well as some degenerative changes of the right thumb CMC joint, but otherwise are unremarkable.  No fractures, lytic lesions, or other significant degenerative changes are identified.  Assessment: Dorsal ulnar right wrist cellulitis with possible localized abscess status post cat bite.  Plan: The treatment options have been discussed with the patient.  Given that the right dorsal wrist pain, swelling, and erythema have persisted if not  worsened despite IV antibiotics, I feel that the patient would benefit from a formal irrigation and debridement of the right wrist wound.  The patient is in agreement with this plan.  Therefore, plans will be made to take her to the operating room tomorrow for a formal irrigation and debridement of the presumed dorsal right wrist abscess by either myself or Dr. Rudene Christians. The risks (including bleeding, persistent or recurrent infection, nerve and/or blood vessel injury, persistent or recurrent pain, need for further surgery, blood clots, strokes, heart attacks or arrhythmias, pneumonia, etc.) and benefits of the surgical procedure were discussed. The patient states her understanding and agrees to proceed. A formal written consent will be obtained by the nursing staff.  Thank you for asking me to participate in the care of this most pleasant yet unfortunate woman.  We will be happy to follow her with you.   Pascal Lux, MD  Beeper #:  [phone]  12/01/2021 2:02 PM

## 2021-12-01 NOTE — Progress Notes (Signed)
PROGRESS NOTE    Amanda [name]  XTK:[id number] DOB: 11-Feb-1951 DOA: 11/30/2021 PCP: Owens Loffler, MD    Chief Complaint  Patient presents with   Hand Pain    Brief Narrative:  Amanda [name] is a 70 y.o. female with medical history significant for DM, HTN, CAD, COPD, hospitalized from 12/21-12/24 with abscess of the right wrist secondary to infected cat bite with positive blood cultures for Pasteurella, treated with  I&D on 12/21, Zosyn->Unasyn x3 days and discharged on Augmentin x7 days who returns to the ED with increased pain swelling and redness of the right wrist . Orthopedics consulted as the pain and swelling hasn't improved. ID consulted for antibiotic use.   Assessment & Plan:   Principal Problem:   Cellulitis of right wrist Active Problems:   Controlled type 2 diabetes mellitus with diabetic autonomic neuropathy, without long-term current use of insulin (HCC)   Essential hypertension   COPD with chronic bronchitis (HCC)   Nonrheumatic aortic valve stenosis   CAD (coronary artery disease), native coronary artery   Depression   Abscess on the dorsal aspect of the right wrist from cat bite:  Will need I&D of the right wrist and fluid to be sent for analysis. Orthopedics consulted due to close proximity to the wrist joint.  Currently on IV Unasyn.  Pain control  ID consulted for the choice and duration of antibiotics as her symptoms persisted with augmentin.  Plan to continue with IV Unasyn for now.    Type 2 DM  CBG (last 3)  Recent Labs    11/30/21 1210 11/30/21 1650 11/30/21 2109  GLUCAP 116* 124* 145*   Controlled with hyperglycemia.  Resume SSI.  Non insulin dependent.  Hemoglobin A1c is 6.4%   Copd with chronic bronchitis.  No wheezing on exam.     Hypertension:  Well controlled BP parameters are optimal.    Pasturella bacteremia:  On Unasyn.    DVT prophylaxis: Lovenox.  Code Status: full code.  Family Communication: (none at  bedside.  Disposition:   Status is: Inpatient  Remains inpatient appropriate because: IV antibiotics.        Consultants:  Orthopedics ID.   Procedures: none.   Antimicrobials:  Antibiotics Given (last 72 hours)     Date/Time Action Medication Dose Rate   11/30/21 0157 New Bag/Given   piperacillin-tazobactam (ZOSYN) IVPB 3.375 g 3.375 g 100 mL/hr   11/30/21 0215 New Bag/Given   clindamycin (CLEOCIN) IVPB 600 mg 600 mg 100 mL/hr   11/30/21 0432 New Bag/Given   Ampicillin-Sulbactam (UNASYN) 3 g in sodium chloride 0.9 % 100 mL IVPB 3 g 200 mL/hr   11/30/21 1027 New Bag/Given   Ampicillin-Sulbactam (UNASYN) 3 g in sodium chloride 0.9 % 100 mL IVPB 3 g 200 mL/hr   11/30/21 1650 New Bag/Given   Ampicillin-Sulbactam (UNASYN) 3 g in sodium chloride 0.9 % 100 mL IVPB 3 g 200 mL/hr   11/30/21 2158 New Bag/Given   Ampicillin-Sulbactam (UNASYN) 3 g in sodium chloride 0.9 % 100 mL IVPB 3 g 200 mL/hr   12/01/21 0459 New Bag/Given   Ampicillin-Sulbactam (UNASYN) 3 g in sodium chloride 0.9 % 100 mL IVPB 3 g 200 mL/hr   12/01/21 0845 New Bag/Given   Ampicillin-Sulbactam (UNASYN) 3 g in sodium chloride 0.9 % 100 mL IVPB 3 g 200 mL/hr         Subjective: Pain and swelling persistent.   Objective: Vitals:   11/30/21 9924 11/30/21 1535 11/30/21 2138 12/01/21 2683  BP: 122/60 125/66 (!) 149/51 127/76  Pulse: 85 87 81 81  Resp: 19 19 16 20   Temp: 98.2 F (36.8 C) 98.1 F (36.7 C) 97.9 F (36.6 C) 99.1 F (37.3 C)  TempSrc: Oral Oral Oral Oral  SpO2: 98% 96% 100% 100%  Weight:      Height:        Intake/Output Summary (Last 24 hours) at 12/01/2021 1441 Last data filed at 12/01/2021 0539 Gross per 24 hour  Intake 120 ml  Output 0 ml  Net 120 ml   Filed Weights   11/29/21 2102  Weight: 106.6 kg    Examination:  General exam: Appears calm and comfortable  Respiratory system: Clear to auscultation. Respiratory effort normal. Cardiovascular system: S1 & S2 heard,  RRR. No JVD, murmurs, rubs, gallops or clicks. No pedal edema. Gastrointestinal system: Abdomen is nondistended, soft and nontender.  Normal bowel sounds heard. Central nervous system: Alert and oriented. No focal neurological deficits. Extremities: Symmetric 5 x 5 power. Skin: RIGHT WRIST swelling with erythema with pus at the site of cat bite.  Painful ROM of the right wrist.  Psychiatry:  Mood & affect appropriate.     Data Reviewed: I have personally reviewed following labs and imaging studies  CBC: Recent Labs  Lab 11/26/21 1623 11/28/21 0857 11/29/21 0314 11/30/21 0132 12/01/21 0732  WBC 8.9 6.6 8.1 8.1 5.7  NEUTROABS 7.0  --   --  5.8  --   HGB 12.0 10.9* 11.4* 10.9* 10.4*  HCT 35.6* 32.3* 34.0* 32.5* 32.0*  MCV 101.4* 99.1 100.0 102.2* 100.6*  PLT 201 218 236 264 357    Basic Metabolic Panel: Recent Labs  Lab 11/26/21 1623 11/28/21 0456 11/29/21 0314 11/30/21 0132 12/01/21 0732  NA 133*  --  135 130* 137  K 3.4*  --  3.7 3.5 3.5  CL 99  --  104 97* 107  CO2 26  --  25 24 25   GLUCOSE 153*  --  151* 145* 136*  BUN 11  --  9 11 8   CREATININE 0.69 0.57 0.54 0.70 0.74  CALCIUM 9.4  --  8.8* 9.2 8.5*    GFR: Estimated Creatinine Clearance: 82.5 mL/min (by C-G formula based on SCr of 0.74 mg/dL).  Liver Function Tests: Recent Labs  Lab 11/26/21 1623 11/29/21 0314  AST 24 32  ALT 20 40  ALKPHOS 48 78  BILITOT 0.8 0.7  PROT 7.3 6.3*  ALBUMIN 3.7 3.0*    CBG: Recent Labs  Lab 11/28/21 0804 11/30/21 0751 11/30/21 1210 11/30/21 1650 11/30/21 2109  GLUCAP 122* 137* 116* 124* 145*     Recent Results (from the past 240 hour(s))  Culture, blood (routine x 2)     Status: Abnormal   Collection Time: 11/26/21  4:23 PM   Specimen: BLOOD  Result Value Ref Range Status   Specimen Description   Final    BLOOD BLLA Performed at [facility], [address] [place], [place] [address]    Special Requests   Final    BOTTLES DRAWN AEROBIC AND  ANAEROBIC BCHV Performed at [facility], [address], [place], [place] [address]    Culture  Setup Time   Final    GRAM NEGATIVE RODS AEROBIC BOTTLE ONLY CRITICAL VALUE NOTED.  VALUE IS CONSISTENT WITH PREVIOUSLY REPORTED AND CALLED VALUE. Performed at [facility], [address], [place], [place] [address]    Culture (A)  Final    PASTEURELLA MULTOCIDA Usually  susceptible to penicillin and other beta lactam agents,quinolones,macrolides and tetracyclines. Performed at [facility], [place] [address] [place], [place] [address]    Report Status 11/29/2021 FINAL  Final  Culture, blood (routine x 2)     Status: Abnormal   Collection Time: 11/26/21  4:23 PM   Specimen: BLOOD  Result Value Ref Range Status   Specimen Description   Final    BLOOD LAC Performed at [facility], [address], [place], [place] [address]    Special Requests   Final    BOTTLES DRAWN AEROBIC AND ANAEROBIC BCAV Performed at [facility], [address], [place], [place] [address]    Culture  Setup Time   Final    GRAM NEGATIVE RODS AEROBIC BOTTLE ONLY Organism ID to follow CRITICAL RESULT CALLED TO, READ BACK BY AND VERIFIED WITHNilsa Nutting PXTGGY [id number] 11/27/21 HNM Performed at [facility], [address] [place], [place] [address]    Culture (A)  Final    PASTEURELLA MULTOCIDA Usually susceptible to penicillin and other beta lactam agents,quinolones,macrolides and tetracyclines. Performed at [facility], [place] [address] [place], [place] [address]    Report Status 11/29/2021 FINAL  Final  Blood Culture ID Panel (Reflexed)     Status: None   Collection Time: 11/26/21  4:23 PM  Result Value Ref Range Status   Enterococcus faecalis NOT DETECTED NOT DETECTED Final   Enterococcus Faecium NOT DETECTED NOT DETECTED Final   Listeria monocytogenes NOT DETECTED NOT DETECTED Final   Staphylococcus species NOT DETECTED NOT DETECTED Final    Staphylococcus aureus (BCID) NOT DETECTED NOT DETECTED Final   Staphylococcus epidermidis NOT DETECTED NOT DETECTED Final   Staphylococcus lugdunensis NOT DETECTED NOT DETECTED Final   Streptococcus species NOT DETECTED NOT DETECTED Final   Streptococcus agalactiae NOT DETECTED NOT DETECTED Final   Streptococcus pneumoniae NOT DETECTED NOT DETECTED Final   Streptococcus pyogenes NOT DETECTED NOT DETECTED Final   A.calcoaceticus-baumannii NOT DETECTED NOT DETECTED Final   Bacteroides fragilis NOT DETECTED NOT DETECTED Final   Enterobacterales NOT DETECTED NOT DETECTED Final   Enterobacter cloacae complex NOT DETECTED NOT DETECTED Final   Escherichia coli NOT DETECTED NOT DETECTED Final   Klebsiella aerogenes NOT DETECTED NOT DETECTED Final   Klebsiella oxytoca NOT DETECTED NOT DETECTED Final   Klebsiella pneumoniae NOT DETECTED NOT DETECTED Final   Proteus species NOT DETECTED NOT DETECTED Final   Salmonella species NOT DETECTED NOT DETECTED Final   Serratia marcescens NOT DETECTED NOT DETECTED Final   Haemophilus influenzae NOT DETECTED NOT DETECTED Final   Neisseria meningitidis NOT DETECTED NOT DETECTED Final   Pseudomonas aeruginosa NOT DETECTED NOT DETECTED Final   Stenotrophomonas maltophilia NOT DETECTED NOT DETECTED Final   Candida albicans NOT DETECTED NOT DETECTED Final   Candida auris NOT DETECTED NOT DETECTED Final   Candida glabrata NOT DETECTED NOT DETECTED Final   Candida krusei NOT DETECTED NOT DETECTED Final   Candida parapsilosis NOT DETECTED NOT DETECTED Final   Candida tropicalis NOT DETECTED NOT DETECTED Final   Cryptococcus neoformans/gattii NOT DETECTED NOT DETECTED Final   CTX-M ESBL NOT DETECTED NOT DETECTED Final   Carbapenem resistance IMP NOT DETECTED NOT DETECTED Final   Carbapenem resistance KPC NOT DETECTED NOT DETECTED Final   Methicillin resistance mecA/C NOT DETECTED NOT DETECTED Final   Meth resistant mecA/C and MREJ NOT DETECTED NOT DETECTED  Final   Carbapenem resistance NDM NOT DETECTED NOT DETECTED Final   Carbapenem  resist OXA 48 LIKE NOT DETECTED NOT DETECTED Final   Vancomycin resistance NOT DETECTED NOT DETECTED Final   Carbapenem resistance VIM NOT DETECTED NOT DETECTED Final    Comment: Performed at [facility], [address] [place], [place] [address]  Aerobic Culture w Gram Stain (superficial specimen)     Status: None   Collection Time: 11/26/21  8:24 PM   Specimen: Abscess  Result Value Ref Range Status   Specimen Description   Final    ABSCESS Performed at [facility] [address] [place], [place] [address]    Special Requests   Final    NONE Performed at [facility], [address] [place], [place] [address]    Gram Stain   Final    ABUNDANT WBC PRESENT, PREDOMINANTLY PMN NO ORGANISMS SEEN    Culture   Final    RARE PASTEURELLA MULTOCIDA Usually susceptible to penicillin and other beta lactam agents,quinolones,macrolides and tetracyclines. Performed at [facility], [place] [address] [place], [place] [address]    Report Status 11/28/2021 FINAL  Final  Resp Panel by RT-PCR (Flu A&B, Covid) Nasopharyngeal Swab     Status: None   Collection Time: 11/26/21  8:26 PM   Specimen: Nasopharyngeal Swab; Nasopharyngeal(NP) swabs in vial transport medium  Result Value Ref Range Status   SARS Coronavirus 2 by RT PCR NEGATIVE NEGATIVE Final    Comment: (NOTE) SARS-CoV-2 target nucleic acids are NOT DETECTED.  The SARS-CoV-2 RNA is generally detectable in upper respiratory specimens during the acute phase of infection. The lowest concentration of SARS-CoV-2 viral copies this assay can detect is 138 copies/mL. A negative result does not preclude SARS-Cov-2 infection and should not be used as the sole basis for treatment or other patient management decisions. A negative result may occur with  improper specimen collection/handling, submission of specimen other than  nasopharyngeal swab, presence of viral mutation(s) within the areas targeted by this assay, and inadequate number of viral copies(<138 copies/mL). A negative result must be combined with clinical observations, patient history, and epidemiological information. The expected result is Negative.  Fact Sheet for Patients:  EntrepreneurPulse.com.au  Fact Sheet for Healthcare Providers:  IncredibleEmployment.be  This test is no t yet approved or cleared by the [place] FDA and  has been authorized for detection and/or diagnosis of SARS-CoV-2 by FDA under an Emergency Use Authorization (EUA). This EUA will remain  in effect (meaning this test can be used) for the duration of the COVID-19 declaration under Section 564(b)(1) of the Act, 21 U.S.C.section 360bbb-3(b)(1), unless the authorization is terminated  or revoked sooner.       Influenza A by PCR NEGATIVE NEGATIVE Final   Influenza B by PCR NEGATIVE NEGATIVE Final    Comment: (NOTE) The Xpert Xpress SARS-CoV-2/FLU/RSV plus assay is intended as an aid in the diagnosis of influenza from Nasopharyngeal swab specimens and should not be used as a sole basis for treatment. Nasal washings and aspirates are unacceptable for Xpert Xpress SARS-CoV-2/FLU/RSV testing.  Fact Sheet for Patients: EntrepreneurPulse.com.au  Fact Sheet for Healthcare Providers: IncredibleEmployment.be  This test is not yet approved or cleared by the [place] FDA and has been authorized for detection and/or diagnosis of SARS-CoV-2 by FDA under an Emergency Use Authorization (EUA). This EUA will remain in effect (meaning this test can be used) for the duration of the COVID-19 declaration under Section 564(b)(1) of the Act, 21 U.S.C. section 360bbb-3(b)(1), unless the authorization is terminated or revoked.  Performed at [facility], [address] [place], [place]  [address]   Culture, blood (routine x 2)     Status: None (Preliminary result)   Collection Time: 11/30/21  1:32 AM   Specimen: BLOOD  Result Value Ref Range Status   Specimen Description BLOOD BLOOD LEFT FOREARM  Final   Special Requests   Final    BOTTLES DRAWN AEROBIC AND ANAEROBIC Blood Culture adequate volume   Culture   Final    NO GROWTH 1 DAY Performed at [facility], [address] [place], [place] [address]    Report Status PENDING  Incomplete  Resp Panel by RT-PCR (Flu A&B, Covid) Nasopharyngeal Swab     Status: None   Collection Time: 11/30/21  3:11 AM   Specimen: Nasopharyngeal Swab; Nasopharyngeal(NP) swabs in vial transport medium  Result Value Ref Range Status   SARS Coronavirus 2 by RT PCR NEGATIVE NEGATIVE Final    Comment: (NOTE) SARS-CoV-2 target nucleic acids are NOT DETECTED.  The SARS-CoV-2 RNA is generally detectable in upper respiratory specimens during the acute phase of infection. The lowest concentration of SARS-CoV-2 viral copies this assay can detect is 138 copies/mL. A negative result does not preclude SARS-Cov-2 infection and should not be used as the sole basis for treatment or other patient management decisions. A negative result may occur with  improper specimen collection/handling, submission of specimen other than nasopharyngeal swab, presence of viral mutation(s) within the areas targeted by this assay, and inadequate number of viral copies(<138 copies/mL). A negative result must be combined with clinical observations, patient history, and epidemiological information. The expected result is Negative.  Fact Sheet for Patients:  EntrepreneurPulse.com.au  Fact Sheet for Healthcare Providers:  IncredibleEmployment.be  This test is no t yet approved or cleared by the [place] FDA and  has been authorized for detection and/or diagnosis of SARS-CoV-2 by FDA under an Emergency Use Authorization  (EUA). This EUA will remain  in effect (meaning this test can be used) for the duration of the COVID-19 declaration under Section 564(b)(1) of the Act, 21 U.S.C.section 360bbb-3(b)(1), unless the authorization is terminated  or revoked sooner.       Influenza A by PCR NEGATIVE NEGATIVE Final   Influenza B by PCR NEGATIVE NEGATIVE Final    Comment: (NOTE) The Xpert Xpress SARS-CoV-2/FLU/RSV plus assay is intended as an aid in the diagnosis of influenza from Nasopharyngeal swab specimens and should not be used as a sole basis for treatment. Nasal washings and aspirates are unacceptable for Xpert Xpress SARS-CoV-2/FLU/RSV testing.  Fact Sheet for Patients: EntrepreneurPulse.com.au  Fact Sheet for Healthcare Providers: IncredibleEmployment.be  This test is not yet approved or cleared by the [place] FDA and has been authorized for detection and/or diagnosis of SARS-CoV-2 by FDA under an Emergency Use Authorization (EUA). This EUA will remain in effect (meaning this test can be used) for the duration of the COVID-19 declaration under Section 564(b)(1) of the Act, 21 U.S.C. section 360bbb-3(b)(1), unless the authorization is terminated or revoked.  Performed at [facility], [address] [place], [place] [address]   Culture, blood (routine x 2)     Status: None (Preliminary result)   Collection Time: 11/30/21  6:35 AM   Specimen: BLOOD LEFT HAND  Result Value Ref Range Status   Specimen Description BLOOD LEFT HAND  Final   Special Requests   Final    BOTTLES DRAWN AEROBIC AND ANAEROBIC Blood Culture adequate volume   Culture  Final    NO GROWTH 1 DAY Performed at [facility], [address], [place], [place] [address]    Report Status PENDING  Incomplete         Radiology Studies: DG Wrist Complete Right  Result Date: 11/30/2021 CLINICAL DATA:  Right wrist pain.  Cat bite. EXAM: RIGHT WRIST - COMPLETE  3+ VIEW COMPARISON:  Right hand radiograph dated 11/26/2021. FINDINGS: There is no acute fracture or dislocation. The bones are osteopenic. Degenerative changes of the base of the thumb. There is soft tissue swelling primarily involving the dorsum of the hand and wrist. No radiopaque foreign object or soft tissue gas. IMPRESSION: 1. No acute fracture or dislocation. 2. Soft tissue swelling. Electronically Signed   By: Anner Crete M.D.   On: 11/30/2021 01:52   [place] RT UPPER EXTREM LTD SOFT TISSUE NON VASCULAR  Result Date: 11/30/2021 CLINICAL DATA:  Right wrist swelling after cat scratch. EXAM: ULTRASOUND right UPPER EXTREMITY LIMITED TECHNIQUE: Ultrasound examination of the upper extremity soft tissues was performed in the area of clinical concern. COMPARISON:  Right wrist radiograph dated 11/30/2021 FINDINGS: There is diffuse edema and hyperemia in the superficial soft tissues which may represent cellulitis. No drainable fluid collection/abscess. IMPRESSION: Diffuse soft tissue edema.  No abscess. Electronically Signed   By: Anner Crete M.D.   On: 11/30/2021 02:17        Scheduled Meds:  amitriptyline  100 mg Oral QHS   vitamin C  1,000 mg Oral Daily   aspirin EC  81 mg Oral Daily   buPROPion  150 mg Oral Daily   enoxaparin (LOVENOX) injection  0.5 mg/kg Subcutaneous Q24H   ezetimibe  10 mg Oral Daily   FLUoxetine  40 mg Oral Daily   gabapentin  100 mg Oral QHS   guaiFENesin  600 mg Oral Daily   insulin aspart  0-15 Units Subcutaneous TID WC   insulin aspart  0-5 Units Subcutaneous QHS   metoprolol tartrate  12.5 mg Oral BID   pantoprazole  40 mg Oral Daily   rosuvastatin  40 mg Oral Daily   Continuous Infusions:  ampicillin-sulbactam (UNASYN) IV 3 g (12/01/21 0845)     LOS: 1 day        Hosie Poisson, MD [facility]   To contact the attending provider between 7A-7P or the covering provider during after hours 7P-7A, please log into the web site [email or web address]  and access using universal [place] password for that web site. If you do not have the password, please call the hospital operator.  12/01/2021, 2:41 PM

## 2021-12-02 ENCOUNTER — Encounter: Payer: Self-pay | Admitting: Internal Medicine

## 2021-12-02 ENCOUNTER — Inpatient Hospital Stay: Payer: Medicare Other | Admitting: Anesthesiology

## 2021-12-02 ENCOUNTER — Inpatient Hospital Stay (HOSPITAL_COMMUNITY)
Admit: 2021-12-02 | Discharge: 2021-12-02 | Disposition: A | Discharge status: Home / Self Care | Payer: Medicare Other | Attending: Orthopedic Surgery | Admitting: Orthopedic Surgery

## 2021-12-02 ENCOUNTER — Encounter
Admission: EM | Disposition: A | Discharge status: Home / Self Care | Payer: Self-pay | Source: Home / Self Care | Attending: Internal Medicine

## 2021-12-02 DIAGNOSIS — L03119 Cellulitis of unspecified part of limb: Secondary | ICD-10-CM | POA: Diagnosis not present

## 2021-12-02 DIAGNOSIS — R7881 Bacteremia: Secondary | ICD-10-CM

## 2021-12-02 DIAGNOSIS — J449 Chronic obstructive pulmonary disease, unspecified: Secondary | ICD-10-CM | POA: Diagnosis not present

## 2021-12-02 DIAGNOSIS — W5501XD Bitten by cat, subsequent encounter: Secondary | ICD-10-CM | POA: Diagnosis not present

## 2021-12-02 DIAGNOSIS — L02519 Cutaneous abscess of unspecified hand: Secondary | ICD-10-CM | POA: Diagnosis not present

## 2021-12-02 DIAGNOSIS — L03113 Cellulitis of right upper limb: Secondary | ICD-10-CM | POA: Diagnosis not present

## 2021-12-02 DIAGNOSIS — E1143 Type 2 diabetes mellitus with diabetic autonomic (poly)neuropathy: Secondary | ICD-10-CM | POA: Diagnosis not present

## 2021-12-02 HISTORY — PX: INCISION AND DRAINAGE: SHX5863

## 2021-12-02 LAB — GLUCOSE, CAPILLARY
Glucose-Capillary: 149 mg/dL — ABNORMAL HIGH (ref 70–99)
Glucose-Capillary: 103 mg/dL — ABNORMAL HIGH (ref 70–99)
Glucose-Capillary: 112 mg/dL — ABNORMAL HIGH (ref 70–99)
Glucose-Capillary: 127 mg/dL — ABNORMAL HIGH (ref 70–99)
Glucose-Capillary: 108 mg/dL — ABNORMAL HIGH (ref 70–99)

## 2021-12-02 SURGERY — INCISION AND DRAINAGE
Anesthesia: General | Site: Wrist | Laterality: Right

## 2021-12-02 MED ORDER — SODIUM CHLORIDE 0.9 % IR SOLN
Status: DC | PRN
  Administered 2021-12-02: 14:00:00 500 mL

## 2021-12-02 MED ORDER — PROPOFOL 10 MG/ML IV BOLUS
INTRAVENOUS | Status: AC
  Filled 2021-12-02: qty 20

## 2021-12-02 MED ORDER — LACTATED RINGERS IV SOLN: INTRAVENOUS | Status: DC | PRN

## 2021-12-02 MED ORDER — CHLORHEXIDINE GLUCONATE CLOTH 2 % EX PADS
6.0000 | MEDICATED_PAD | Freq: Once | CUTANEOUS | Status: AC
  Administered 2021-12-02: 05:00:00 6 via TOPICAL

## 2021-12-02 MED ORDER — FENTANYL CITRATE (PF) 100 MCG/2ML IJ SOLN
INTRAMUSCULAR | Status: AC
  Filled 2021-12-02: qty 2

## 2021-12-02 MED ORDER — NEOMYCIN-POLYMYXIN B GU 40-200000 IR SOLN
Status: AC
  Filled 2021-12-02: qty 2

## 2021-12-02 MED ORDER — FENTANYL CITRATE (PF) 100 MCG/2ML IJ SOLN
INTRAMUSCULAR | Status: DC | PRN
  Administered 2021-12-02 (×2): 25 ug via INTRAVENOUS

## 2021-12-02 MED ORDER — PROPOFOL 500 MG/50ML IV EMUL
INTRAVENOUS | Status: DC | PRN
  Administered 2021-12-02: 100 ug/kg/min via INTRAVENOUS

## 2021-12-02 MED ORDER — FENTANYL CITRATE (PF) 100 MCG/2ML IJ SOLN
25.0000 ug | INTRAMUSCULAR | Status: DC | PRN
  Administered 2021-12-02 (×2): 25 ug via INTRAVENOUS

## 2021-12-02 MED ORDER — DEXMEDETOMIDINE (PRECEDEX) IN NS 20 MCG/5ML (4 MCG/ML) IV SYRINGE
PREFILLED_SYRINGE | INTRAVENOUS | Status: DC | PRN
  Administered 2021-12-02 (×2): 4 ug via INTRAVENOUS

## 2021-12-02 SURGICAL SUPPLY — 28 items
BNDG ELASTIC 3X5.8 VLCR NS LF (GAUZE/BANDAGES/DRESSINGS) ×3 IMPLANT
CAST PADDING 3X4FT ST 30246 (SOFTGOODS) ×2
CHLORAPREP W/TINT 26 (MISCELLANEOUS) ×3 IMPLANT
CUFF TOURN SGL QUICK 18X4 (TOURNIQUET CUFF) IMPLANT
CUFF TOURN SGL QUICK 24 (TOURNIQUET CUFF)
CUFF TRNQT CYL 24X4X16.5-23 (TOURNIQUET CUFF) IMPLANT
ELECT REM PT RETURN 9FT ADLT (ELECTROSURGICAL) ×3
ELECTRODE REM PT RTRN 9FT ADLT (ELECTROSURGICAL) ×1 IMPLANT
GAUZE 4X4 16PLY ~~LOC~~+RFID DBL (SPONGE) ×3 IMPLANT
GAUZE SPONGE 4X4 12PLY STRL (GAUZE/BANDAGES/DRESSINGS) ×3 IMPLANT
GAUZE XEROFORM 1X8 LF (GAUZE/BANDAGES/DRESSINGS) ×3 IMPLANT
GLOVE SURG SYN 9.0  PF PI (GLOVE) ×3
GLOVE SURG SYN 9.0 PF PI (GLOVE) ×1 IMPLANT
GOWN SRG 2XL LVL 4 RGLN SLV (GOWNS) ×1 IMPLANT
GOWN STRL NON-REIN 2XL LVL4 (GOWNS) ×3
GOWN STRL REUS W/ TWL LRG LVL3 (GOWN DISPOSABLE) ×1 IMPLANT
GOWN STRL REUS W/TWL LRG LVL3 (GOWN DISPOSABLE) ×3
KIT TURNOVER KIT A (KITS) ×3 IMPLANT
MANIFOLD NEPTUNE II (INSTRUMENTS) ×3 IMPLANT
NS IRRIG 500ML POUR BTL (IV SOLUTION) ×3 IMPLANT
PACK EXTREMITY ARMC (MISCELLANEOUS) ×3 IMPLANT
PAD CAST CTTN 3X4 STRL (SOFTGOODS) ×1 IMPLANT
PAD PREP 24X41 OB/GYN DISP (PERSONAL CARE ITEMS) ×3 IMPLANT
PADDING CAST COTTON 3X4 STRL (SOFTGOODS) ×1
SCALPEL PROTECTED #10 DISP (BLADE) ×3 IMPLANT
SCALPEL PROTECTED #15 DISP (BLADE) ×3 IMPLANT
SUT ETHILON 4 0 P 3 18 (SUTURE) ×3 IMPLANT
WATER STERILE IRR 500ML POUR (IV SOLUTION) ×3 IMPLANT

## 2021-12-02 NOTE — Progress Notes (Signed)
Patient returned from the OR.

## 2021-12-02 NOTE — Anesthesia Preprocedure Evaluation (Signed)
Anesthesia Evaluation  Patient identified by MRN, date of birth, ID band Patient awake    Reviewed: Allergy & Precautions, H&P , NPO status , Patient's Chart, lab work & pertinent test results, reviewed documented beta blocker date and time   History of Anesthesia Complications Negative for: history of anesthetic complications  Airway Mallampati: I  TM Distance: >3 FB Neck ROM: full    Dental  (+) Dental Advidsory Given, Edentulous Upper, Edentulous Lower   Pulmonary shortness of breath and with exertion, COPD,  COPD inhaler, neg recent URI, Current Smoker and Patient abstained from smoking.,    Pulmonary exam normal breath sounds clear to auscultation       Cardiovascular Exercise Tolerance: Good hypertension, (-) angina+ CAD  (-) Past MI and (-) Cardiac Stents Normal cardiovascular exam(-) dysrhythmias + Valvular Problems/Murmurs AS  Rhythm:regular Rate:Normal     Neuro/Psych PSYCHIATRIC DISORDERS Anxiety Depression negative neurological ROS     GI/Hepatic Neg liver ROS, GERD  ,  Endo/Other  diabetes, Type 2  Renal/GU negative Renal ROS  negative genitourinary   Musculoskeletal   Abdominal   Peds  Hematology negative hematology ROS (+)   Anesthesia Other Findings Past Medical History: No date: 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 sting     Comment:  bee stings No date: Aortic stenosis     Comment:  a. 2013: nl LV sys fxn, mild MR, no evidence of pulm               htn; b. TTE 8/17: EF 60-65%, no RWMA, nl LV dia fxn,                mild AS, mod AI 11/26/2020: CAD (coronary artery disease), native coronary artery 08/10/2007: Controlled type 2 diabetes mellitus with diabetic  autonomic neuropathy, without long-term current use of insulin ([place])     Comment:  Qualifier: Diagnosis of  By: Council Mechanic MD, Hilaria Ota  10/30/2016: COPD (chronic obstructive pulmonary disease) ([place]) No date: Depression No date: GAD (generalized anxiety  disorder) No date: GERD (gastroesophageal reflux disease) 1997: Hiatal hernia with gastroesophageal reflux No date: Hyperlipidemia No date: Hypertension No date: Osteoporosis No date: Tobacco abuse   Reproductive/Obstetrics negative OB ROS                             Anesthesia Physical Anesthesia Plan  ASA: 3  Anesthesia Plan: General   Post-op Pain Management:    Induction: Intravenous  PONV Risk Score and Plan: 2 and Ondansetron and Treatment may vary due to age or medical condition  Airway Management Planned: LMA  Additional Equipment:   Intra-op Plan:   Post-operative Plan: Extubation in OR  Informed Consent: I have reviewed the patients History and Physical, chart, labs and discussed the procedure including the risks, benefits and alternatives for the proposed anesthesia with the patient or authorized representative who has indicated his/her understanding and acceptance.     Dental Advisory Given  Plan Discussed with: Anesthesiologist, CRNA and Surgeon  Anesthesia Plan Comments:         Anesthesia Quick Evaluation

## 2021-12-02 NOTE — Progress Notes (Signed)
I had initially seen patient last week and she was discharged over the holiday weekend. She has developed abscess and plan on I&D and placement of drain.

## 2021-12-02 NOTE — Evaluation (Signed)
Occupational Therapy Evaluation Patient Details Name: Amanda [name] MRN: [id number] DOB: Jun 17, 1951 Today's Date: 12/02/2021   History of Present Illness 70 y.o. with a history of HTN, aortic stenosis, non obstructive CAD, COPD, Diet controlled DM, was recently hospitalized between 12/21-12/24 for rt hand/wrist pain and swelling from a cat bite . There was an abscess on the wrist area and it was I/D in the ED. That grew pasteurella  She had pasteurella bacteremia and after being on IV zosyn was discharged on PO augmentin.   She returned to the ED on 12/25 with worsening swelling and pain and redness of the rt wrist area and dorsum hand and restricted movt of the wrist   Clinical Impression   Patient presenting with decreased Ind in self care, balance, functional mobility/transfers, endurance, and safety awareness. Patient reports being independent at baseline without use of AD for self care and IADL tasks. Pt reports her husband just returned home from SNF and daughter lives with them but that she also has health problems. Pt    donning B shoes and performs ambulation and toileting needs with supervision. Pt is limited in ROM and coordination for R UE secondary to edema and pain. Pt only able to demonstate opposition to first digit. OT will reassess after I &D if pt needs further modifications or exercise for independence at home. Patient will benefit from acute OT to increase overall independence in the areas of ADLs, functional mobility, and safety awareness in order to safely discharge home with family.     Recommendations for follow up therapy are one component of a multi-disciplinary discharge planning process, led by the attending physician.  Recommendations may be updated based on patient status, additional functional criteria and insurance authorization.   Follow Up Recommendations  Follow physician's recommendations for discharge plan and follow up therapies    Assistance Recommended at  Discharge Set up Supervision/Assistance  Functional Status Assessment  Patient has had a recent decline in their functional status and demonstrates the ability to make significant improvements in function in a reasonable and predictable amount of time.  Equipment Recommendations  None recommended by OT       Precautions / Restrictions Precautions Precautions: Fall      Mobility Bed Mobility Overal bed mobility: Modified Independent             General bed mobility comments: no physical assistance    Transfers Overall transfer level: Needs assistance   Transfers: Sit to/from Stand;Bed to chair/wheelchair/BSC Sit to Stand: Supervision Stand pivot transfers: Supervision         General transfer comment: no physical assistance but close supervision for safety      Balance Overall balance assessment: Mild deficits observed, not formally tested                                         ADL either performed or assessed with clinical judgement   ADL Overall ADL's : Needs assistance/impaired                                       General ADL Comments: supervision overall for functional mobility without use of AD. Modifications needed for independence secondary to R hand edema.     Vision Patient Visual Report: No change from baseline  Pertinent Vitals/Pain Pain Assessment: 0-10 Pain Score: 7  Pain Location: R wrist/hand Pain Descriptors / Indicators: Discomfort;Grimacing;Guarding Pain Intervention(s): Limited activity within patient's tolerance;Monitored during session;Repositioned     Hand Dominance Right   Extremity/Trunk Assessment Upper Extremity Assessment Upper Extremity Assessment: RUE deficits/detail RUE Deficits / Details: opposition of thumb and index only. Very limited secondary to edema.   Lower Extremity Assessment Lower Extremity Assessment: Overall WFL for tasks assessed       Communication  Communication Communication: No difficulties   Cognition Arousal/Alertness: Awake/alert Behavior During Therapy: WFL for tasks assessed/performed Overall Cognitive Status: Within Functional Limits for tasks assessed                                                  Home Living Family/patient expects to be discharged to:: Private residence Living Arrangements: Spouse/significant other;Children Available Help at Discharge: Family;Available 24 hours/day Type of Home: House Home Access: Level entry     Home Layout: One level Alternate Level Stairs-Number of Steps: 3 steps down into Den but now a ramp is built to use   Bathroom Shower/Tub: Teacher, early years/pre: Standard Bathroom Accessibility: Yes   Home Equipment: Conservation officer, nature (2 wheels);Cane - single point;Shower seat;Adaptive equipment Adaptive Equipment: Reacher;Sock aid Additional Comments: Pt's spouse has parkinson's and also just came home from SNF before christmas. Daughter lives at home but is really unable to physically assist because she has health issues.      Prior Functioning/Environment Prior Level of Function : Independent/Modified Independent             Mobility Comments: Pt reports ind without use of AD ADLs Comments: Ind in all aspects of self care, cooking, cleaning, and driving.        OT Problem List: Decreased strength;Decreased activity tolerance;Impaired balance (sitting and/or standing);Decreased safety awareness;Pain;Decreased range of motion;Decreased knowledge of use of DME or AE      OT Treatment/Interventions: Self-care/ADL training;Manual therapy;Therapeutic exercise;Patient/family education;Balance training;Energy conservation;Therapeutic activities;DME and/or AE instruction    OT Goals(Current goals can be found in the care plan section) Acute Rehab OT Goals Patient Stated Goal: to return home OT Goal Formulation: With patient Time For Goal  Achievement: 12/16/21 Potential to Achieve Goals: Fair ADL Goals Pt Will Perform Grooming: with modified independence Pt Will Perform Lower Body Dressing: with modified independence Pt Will Transfer to Toilet: with modified independence Pt Will Perform Toileting - Clothing Manipulation and hygiene: with modified independence  OT Frequency: Min 2X/week   Barriers to D/C:    Pt does not have physical assistance at home but does not need any at this time          AM-PAC OT "6 Clicks" Daily Activity     Outcome Measure Help from another person eating meals?: A Little Help from another person taking care of personal grooming?: A Little Help from another person toileting, which includes using toliet, bedpan, or urinal?: A Little Help from another person bathing (including washing, rinsing, drying)?: A Little Help from another person to put on and taking off regular upper body clothing?: A Little Help from another person to put on and taking off regular lower body clothing?: A Little 6 Click Score: 18   End of Session Nurse Communication: Mobility status  Activity Tolerance: Patient tolerated treatment well Patient left: in bed;with call bell/phone within  reach  OT Visit Diagnosis: Unsteadiness on feet (R26.81);Repeated falls (R29.6)                Time: 0950-1006 OT Time Calculation (min): 16 min Charges:  OT General Charges $OT Visit: 1 Visit OT Evaluation $OT Eval Low Complexity: 1 Low OT Treatments $Self Care/Home Management : 8-22 mins  Darleen Crocker, MS, OTR/L , CBIS ascom [phone]  12/02/21, 12:57 PM

## 2021-12-02 NOTE — Progress Notes (Signed)
° °  Date of Admission:  11/30/2021     ID: Amanda [name] is a 70 y.o. female  Principal Problem:   Cellulitis of right wrist Active Problems:   Controlled type 2 diabetes mellitus with diabetic autonomic neuropathy, without long-term current use of insulin (HCC)   Essential hypertension   COPD with chronic bronchitis (HCC)   Nonrheumatic aortic valve stenosis   CAD (coronary artery disease), native coronary artery   Depression    Subjective: Pt underwent I/D of the rt wrist abscess today Doing well No fever      Medications:   [MAR Hold] amitriptyline  100 mg Oral QHS   [MAR Hold] vitamin C  1,000 mg Oral Daily   [MAR Hold] aspirin EC  81 mg Oral Daily   [MAR Hold] buPROPion  150 mg Oral Daily   [MAR Hold] enoxaparin (LOVENOX) injection  0.5 mg/kg Subcutaneous Q24H   [MAR Hold] ezetimibe  10 mg Oral Daily   fentaNYL       [MAR Hold] FLUoxetine  40 mg Oral Daily   [MAR Hold] gabapentin  100 mg Oral QHS   [MAR Hold] guaiFENesin  600 mg Oral Daily   [MAR Hold] insulin aspart  0-15 Units Subcutaneous TID WC   [MAR Hold] insulin aspart  0-5 Units Subcutaneous QHS   [MAR Hold] metoprolol tartrate  12.5 mg Oral BID   [MAR Hold] pantoprazole  40 mg Oral Daily   [MAR Hold] rosuvastatin  40 mg Oral Daily    Objective: Vital signs in last 24 hours: Temp:  [97 F (36.1 C)-98.7 F (37.1 C)] 97.7 F (36.5 C) (12/27 1500) Pulse Rate:  [69-82] 69 (12/27 1500) Resp:  [13-20] 15 (12/27 1500) BP: (111-146)/(54-71) 144/56 (12/27 1500) SpO2:  [93 %-98 %] 93 % (12/27 1500)  PHYSICAL EXAM:  General: Alert, cooperative, no distress, appears stated age.  Lungs: Clear to auscultation bilaterally. No Wheezing or Rhonchi. No rales. Heart: systolic murmur Abdomen: Soft, non-tender,not distended. Bowel sounds normal. No masses Extremities: rt wrist deressing Skin: No rashes or lesions. Or bruising Lymph: Cervical, supraclavicular normal. Neurologic: Grossly non-focal  Lab  Results Recent Labs    11/30/21 0132 12/01/21 0732  WBC 8.1 5.7  HGB 10.9* 10.4*  HCT 32.5* 32.0*  NA 130* 137  K 3.5 3.5  CL 97* 107  CO2 24 25  BUN 11 8  CREATININE 0.70 0.74     Assessment/Plan: Cat bite abscess with pasteurella bacteremia and pasteurella wound  On unasyn Had I/D of the abscess today- has a drain  On  unasyn 2 D echo pending ?will let Dr.Arida know   Aortic stenosis and non obstructive CAD- followed by cardiology as OP- on rosuvastatin,    DM- diet controlled currently   H/O B/l TKA   H/o Lumbar surgery H/o Cervical surgery with hardware- one of the screws is loose she says, chronic neck pain   Discussed the management with her

## 2021-12-02 NOTE — Transfer of Care (Signed)
Immediate Anesthesia Transfer of Care Note  Patient: Amanda [name]  Procedure(s) Performed: INCISION AND DRAINAGE (Right: Wrist)  Patient Location: PACU  Anesthesia Type:General  Level of Consciousness: awake, alert  and patient cooperative  Airway & Oxygen Therapy: Patient Spontanous Breathing  Post-op Assessment: Report given to RN and Post -op Vital signs reviewed and stable  Post vital signs: Reviewed and stable  Last Vitals:  Vitals Value Taken Time  BP 139/67 12/02/21 1427  Temp    Pulse 73 12/02/21 1428  Resp 15 12/02/21 1428  SpO2 97 % 12/02/21 1428  Vitals shown include unvalidated device data.  Last Pain:  Vitals:   12/02/21 1323  TempSrc: Oral  PainSc:       Patients Stated Pain Goal: 0 (87/68/11 5726)  Complications: No notable events documented.

## 2021-12-02 NOTE — Op Note (Signed)
12/02/2021  2:32 PM  PATIENT:  Amanda [name]  70 y.o. female  PRE-OPERATIVE DIAGNOSIS:  Right Dorsal Wrist Abscess  POST-OPERATIVE DIAGNOSIS:  Right Dorsal Wrist Abscess  PROCEDURE:  Procedure(s): INCISION AND DRAINAGE (Right)  SURGEON: Laurene Footman, MD  ASSISTANTS: None  ANESTHESIA:   IV sedation  EBL:  Total I/O In: 357.6 [I.V.:233.2; IV Piggyback:124.4] Out: 5 [Blood:5]  BLOOD ADMINISTERED:none  DRAINS: Penrose drain in the dorsal wrist abscess    LOCAL MEDICATIONS USED:  NONE  SPECIMEN:  No Specimen  DISPOSITION OF SPECIMEN:  N/A  COUNTS:  YES  TOURNIQUET:  * Missing tourniquet times found for documented tourniquets in log: [id number] *  IMPLANTS: None  DICTATION: .Dragon Dictation patient was brought to the operating room and after adequate IV sedation was given the right arm was prepped and draped in the usual sterile fashion with a tourniquet applied but not inflated to the upper arm.  After patient identification and timeout procedure were completed area of the prior I&D done by ER staff was elliptically excised with sharp dissection using a scalpel.  The area was quite raised and after placing a hemostat and spreading this there was gross thick pus present.  This was removed with use of the hemostat.  The abscess was approximately 2 cm in diameter and 2 cm in depth.  It was then thoroughly irrigated with antibiotic irrigation and after this was clear and there was no further gross pus present a Penrose drain was placed and the wound left open.  The wound was then dressed with 4 x 4's web roll and Ace wrap.  Patient tolerated procedure well  PLAN OF CARE: Admit to inpatient   PATIENT DISPOSITION:  PACU - hemodynamically stable.

## 2021-12-02 NOTE — Progress Notes (Signed)
PT Cancellation Note  Patient Details Name: Amanda [name] MRN: [id number] DOB: Mar 13, 1951   Cancelled Treatment:    Reason Eval/Treat Not Completed: Patient declined, no reason specified Patient declined OOB activity at this time and is anticipated to have surgery this afternoon. PT continue with attempts as appropriate.   Minna Merritts, PT, MPT  Percell Locus 12/02/2021, 1:51 PM

## 2021-12-02 NOTE — Progress Notes (Signed)
Patient transported to OR via bed.

## 2021-12-02 NOTE — Anesthesia Procedure Notes (Signed)
Procedure Name: MAC Date/Time: 12/02/2021 2:11 PM Performed by: Jerrye Noble, CRNA Pre-anesthesia Checklist: Patient identified, Emergency Drugs available, Suction available and Patient being monitored Patient Re-evaluated:Patient Re-evaluated prior to induction Oxygen Delivery Method: Nasal cannula

## 2021-12-02 NOTE — Progress Notes (Signed)
PROGRESS NOTE    Amanda [name]  KDX:[id number] DOB: 1951/04/28 DOA: 11/30/2021 PCP: Owens Loffler, MD    Chief Complaint  Patient presents with   Hand Pain    Brief Narrative:  Amanda [name] is a 70 y.o. female with medical history significant for DM, HTN, CAD, COPD, hospitalized from 12/21-12/24 with abscess of the right wrist secondary to infected cat bite with positive blood cultures for Pasteurella, treated with  I&D on 12/21, Zosyn->Unasyn x3 days and discharged on Augmentin x7 days who returns to the ED with increased pain swelling and redness of the right wrist . Orthopedic Surgeon consulted as the pain and swelling hasn't improved, referred to Dr Rudene Christians for repeat Incision and Drainage of the dorsal abscess, scheduled for 12/02/21. ID consulted for antibiotic use.   Assessment & Plan:   Principal Problem:   Cellulitis of right wrist Active Problems:   Controlled type 2 diabetes mellitus with diabetic autonomic neuropathy, without long-term current use of insulin (HCC)   Essential hypertension   COPD with chronic bronchitis (HCC)   Nonrheumatic aortic valve stenosis   CAD (coronary artery disease), native coronary artery   Depression   Abscess on the dorsal aspect of the right wrist from cat bite:  Will need I&D of the right wrist and fluid to be sent for analysis. Orthopedics consulted due to close proximity to the wrist joint. Referred to Dr Rudene Christians for incision and drainage scheduled on 12/02/21.  Currently on IV Unasyn.  Pain control  ID consulted for the choice and duration of antibiotics as her symptoms persisted with augmentin.  Plan to continue with IV Unasyn for now.    Type 2 DM  CBG (last 3)  Recent Labs    12/02/21 0739 12/02/21 1143 12/02/21 1442  GLUCAP 149* 103* 112*    Controlled with hyperglycemia.  Resume SSI.  Non insulin dependent.  Hemoglobin A1c is 6.4%   Copd with chronic bronchitis.  No wheezing on exam.     Hypertension:   BP parameters are optimal.    Pasturella bacteremia:  On Unasyn. ID recommending ECHO.    DVT prophylaxis: Lovenox.  Code Status: full code.  Family Communication: none at bedside.  Disposition:   Status is: Inpatient  Remains inpatient appropriate because: IV antibiotics.        Consultants:  Orthopedics ID.   Procedures: Incision and Drainage on 12/02/21  Antimicrobials:  Antibiotics Given (last 72 hours)     Date/Time Action Medication Dose Rate   11/30/21 0157 New Bag/Given   piperacillin-tazobactam (ZOSYN) IVPB 3.375 g 3.375 g 100 mL/hr   11/30/21 0215 New Bag/Given   clindamycin (CLEOCIN) IVPB 600 mg 600 mg 100 mL/hr   11/30/21 0432 New Bag/Given   Ampicillin-Sulbactam (UNASYN) 3 g in sodium chloride 0.9 % 100 mL IVPB 3 g 200 mL/hr   11/30/21 1027 New Bag/Given   Ampicillin-Sulbactam (UNASYN) 3 g in sodium chloride 0.9 % 100 mL IVPB 3 g 200 mL/hr   11/30/21 1650 New Bag/Given   Ampicillin-Sulbactam (UNASYN) 3 g in sodium chloride 0.9 % 100 mL IVPB 3 g 200 mL/hr   11/30/21 2158 New Bag/Given   Ampicillin-Sulbactam (UNASYN) 3 g in sodium chloride 0.9 % 100 mL IVPB 3 g 200 mL/hr   12/01/21 0459 New Bag/Given   Ampicillin-Sulbactam (UNASYN) 3 g in sodium chloride 0.9 % 100 mL IVPB 3 g 200 mL/hr   12/01/21 0845 New Bag/Given   Ampicillin-Sulbactam (UNASYN) 3 g in sodium chloride 0.9 % 100  mL IVPB 3 g 200 mL/hr   12/01/21 1731 New Bag/Given   Ampicillin-Sulbactam (UNASYN) 3 g in sodium chloride 0.9 % 100 mL IVPB 3 g 200 mL/hr   12/01/21 2234 New Bag/Given   Ampicillin-Sulbactam (UNASYN) 3 g in sodium chloride 0.9 % 100 mL IVPB 3 g 200 mL/hr   12/02/21 0513 New Bag/Given   Ampicillin-Sulbactam (UNASYN) 3 g in sodium chloride 0.9 % 100 mL IVPB 3 g 200 mL/hr   12/02/21 0901 New Bag/Given   Ampicillin-Sulbactam (UNASYN) 3 g in sodium chloride 0.9 % 100 mL IVPB 3 g 200 mL/hr         Subjective: Pain controlled with meds.   Objective: Vitals:   12/02/21  1445 12/02/21 1451 12/02/21 1500 12/02/21 1525  BP:   (!) 144/56 (!) 132/51  Pulse: 70 69 69 71  Resp: 13 17 15 16   Temp:   97.7 F (36.5 C) 98 F (36.7 C)  TempSrc:    Oral  SpO2: 96% 97% 93% 95%  Weight:      Height:        Intake/Output Summary (Last 24 hours) at 12/02/2021 1553 Last data filed at 12/02/2021 1420 Gross per 24 hour  Intake 859.39 ml  Output 5 ml  Net 854.39 ml    Filed Weights   11/29/21 2102  Weight: 106.6 kg    Examination:  General exam: Appears calm and comfortable  Respiratory system: Clear to auscultation. Respiratory effort normal. Cardiovascular system: S1 & S2 heard, RRR. No JVD,  No pedal edema. Gastrointestinal system: Abdomen is nondistended, soft and nontender.  Normal bowel sounds heard. Central nervous system: Alert and oriented. No focal neurological deficits. Extremities: painful ROM of the right hand and wrist.  Skin: right wrist and hand swelling with erythema with pus at the site of cat bite.  Psychiatry:  Mood & affect appropriate.      Data Reviewed: I have personally reviewed following labs and imaging studies  CBC: Recent Labs  Lab 11/26/21 1623 11/28/21 0857 11/29/21 0314 11/30/21 0132 12/01/21 0732  WBC 8.9 6.6 8.1 8.1 5.7  NEUTROABS 7.0  --   --  5.8  --   HGB 12.0 10.9* 11.4* 10.9* 10.4*  HCT 35.6* 32.3* 34.0* 32.5* 32.0*  MCV 101.4* 99.1 100.0 102.2* 100.6*  PLT 201 218 236 264 260     Basic Metabolic Panel: Recent Labs  Lab 11/26/21 1623 11/28/21 0456 11/29/21 0314 11/30/21 0132 12/01/21 0732  NA 133*  --  135 130* 137  K 3.4*  --  3.7 3.5 3.5  CL 99  --  104 97* 107  CO2 26  --  25 24 25   GLUCOSE 153*  --  151* 145* 136*  BUN 11  --  9 11 8   CREATININE 0.69 0.57 0.54 0.70 0.74  CALCIUM 9.4  --  8.8* 9.2 8.5*     GFR: Estimated Creatinine Clearance: 82.5 mL/min (by C-G formula based on SCr of 0.74 mg/dL).  Liver Function Tests: Recent Labs  Lab 11/26/21 1623 11/29/21 0314  AST 24 32   ALT 20 40  ALKPHOS 48 78  BILITOT 0.8 0.7  PROT 7.3 6.3*  ALBUMIN 3.7 3.0*     CBG: Recent Labs  Lab 12/01/21 1714 12/01/21 2050 12/02/21 0739 12/02/21 1143 12/02/21 1442  GLUCAP 224* 114* 149* 103* 112*      Recent Results (from the past 240 hour(s))  Culture, blood (routine x 2)     Status: Abnormal  Collection Time: 11/26/21  4:23 PM   Specimen: BLOOD  Result Value Ref Range Status   Specimen Description   Final    BLOOD BLLA Performed at [facility], [address] [place], [place] [address]    Special Requests   Final    BOTTLES DRAWN AEROBIC AND ANAEROBIC BCHV Performed at [facility], [address] [place], [place] [address]    Culture  Setup Time   Final    GRAM NEGATIVE RODS AEROBIC BOTTLE ONLY CRITICAL VALUE NOTED.  VALUE IS CONSISTENT WITH PREVIOUSLY REPORTED AND CALLED VALUE. Performed at [facility], [address] [place], [place] [address]    Culture (A)  Final    PASTEURELLA MULTOCIDA Usually susceptible to penicillin and other beta lactam agents,quinolones,macrolides and tetracyclines. Performed at [facility], [place] [address], [place], [place] [address]    Report Status 11/29/2021 FINAL  Final  Culture, blood (routine x 2)     Status: Abnormal   Collection Time: 11/26/21  4:23 PM   Specimen: BLOOD  Result Value Ref Range Status   Specimen Description   Final    BLOOD LAC Performed at [facility], [address] [place], [place] [address]    Special Requests   Final    BOTTLES DRAWN AEROBIC AND ANAEROBIC BCAV Performed at [facility], [address] [place], [place] [address]    Culture  Setup Time   Final    GRAM NEGATIVE RODS AEROBIC BOTTLE ONLY Organism ID to follow CRITICAL RESULT CALLED TO, READ BACK BY AND VERIFIED WITHNilsa Nutting [id number] 11/27/21 HNM Performed at [facility], [address] [place], [place] [address]    Culture (A)  Final     PASTEURELLA MULTOCIDA Usually susceptible to penicillin and other beta lactam agents,quinolones,macrolides and tetracyclines. Performed at [facility], [place] [address], [place], [place] [address]    Report Status 11/29/2021 FINAL  Final  Blood Culture ID Panel (Reflexed)     Status: None   Collection Time: 11/26/21  4:23 PM  Result Value Ref Range Status   Enterococcus faecalis NOT DETECTED NOT DETECTED Final   Enterococcus Faecium NOT DETECTED NOT DETECTED Final   Listeria monocytogenes NOT DETECTED NOT DETECTED Final   Staphylococcus species NOT DETECTED NOT DETECTED Final   Staphylococcus aureus (BCID) NOT DETECTED NOT DETECTED Final   Staphylococcus epidermidis NOT DETECTED NOT DETECTED Final   Staphylococcus lugdunensis NOT DETECTED NOT DETECTED Final   Streptococcus species NOT DETECTED NOT DETECTED Final   Streptococcus agalactiae NOT DETECTED NOT DETECTED Final   Streptococcus pneumoniae NOT DETECTED NOT DETECTED Final   Streptococcus pyogenes NOT DETECTED NOT DETECTED Final   A.calcoaceticus-baumannii NOT DETECTED NOT DETECTED Final   Bacteroides fragilis NOT DETECTED NOT DETECTED Final   Enterobacterales NOT DETECTED NOT DETECTED Final   Enterobacter cloacae complex NOT DETECTED NOT DETECTED Final   Escherichia coli NOT DETECTED NOT DETECTED Final   Klebsiella aerogenes NOT DETECTED NOT DETECTED Final   Klebsiella oxytoca NOT DETECTED NOT DETECTED Final   Klebsiella pneumoniae NOT DETECTED NOT DETECTED Final   Proteus species NOT DETECTED NOT DETECTED Final   Salmonella species NOT DETECTED NOT DETECTED Final   Serratia marcescens NOT DETECTED NOT DETECTED Final   Haemophilus influenzae NOT DETECTED NOT DETECTED Final   Neisseria meningitidis NOT DETECTED NOT DETECTED Final   Pseudomonas aeruginosa NOT DETECTED NOT DETECTED Final   Stenotrophomonas maltophilia NOT DETECTED NOT DETECTED Final  Candida albicans NOT DETECTED NOT DETECTED Final   Candida auris NOT  DETECTED NOT DETECTED Final   Candida glabrata NOT DETECTED NOT DETECTED Final   Candida krusei NOT DETECTED NOT DETECTED Final   Candida parapsilosis NOT DETECTED NOT DETECTED Final   Candida tropicalis NOT DETECTED NOT DETECTED Final   Cryptococcus neoformans/gattii NOT DETECTED NOT DETECTED Final   CTX-M ESBL NOT DETECTED NOT DETECTED Final   Carbapenem resistance IMP NOT DETECTED NOT DETECTED Final   Carbapenem resistance KPC NOT DETECTED NOT DETECTED Final   Methicillin resistance mecA/C NOT DETECTED NOT DETECTED Final   Meth resistant mecA/C and MREJ NOT DETECTED NOT DETECTED Final   Carbapenem resistance NDM NOT DETECTED NOT DETECTED Final   Carbapenem resist OXA 48 LIKE NOT DETECTED NOT DETECTED Final   Vancomycin resistance NOT DETECTED NOT DETECTED Final   Carbapenem resistance VIM NOT DETECTED NOT DETECTED Final    Comment: Performed at [facility], [address], [place], [place] [address]  Aerobic Culture w Gram Stain (superficial specimen)     Status: None   Collection Time: 11/26/21  8:24 PM   Specimen: Abscess  Result Value Ref Range Status   Specimen Description   Final    ABSCESS Performed at [facility], [address], [place], [place] [address]    Special Requests   Final    NONE Performed at [facility], [address] [place], [place] [address]    Gram Stain   Final    ABUNDANT WBC PRESENT, PREDOMINANTLY PMN NO ORGANISMS SEEN    Culture   Final    RARE PASTEURELLA MULTOCIDA Usually susceptible to penicillin and other beta lactam agents,quinolones,macrolides and tetracyclines. Performed at [facility], [place] [address] [place], [place] [address]    Report Status 11/28/2021 FINAL  Final  Resp Panel by RT-PCR (Flu A&B, Covid) Nasopharyngeal Swab     Status: None   Collection Time: 11/26/21  8:26 PM   Specimen: Nasopharyngeal Swab; Nasopharyngeal(NP) swabs in vial transport medium  Result Value Ref Range Status    SARS Coronavirus 2 by RT PCR NEGATIVE NEGATIVE Final    Comment: (NOTE) SARS-CoV-2 target nucleic acids are NOT DETECTED.  The SARS-CoV-2 RNA is generally detectable in upper respiratory specimens during the acute phase of infection. The lowest concentration of SARS-CoV-2 viral copies this assay can detect is 138 copies/mL. A negative result does not preclude SARS-Cov-2 infection and should not be used as the sole basis for treatment or other patient management decisions. A negative result may occur with  improper specimen collection/handling, submission of specimen other than nasopharyngeal swab, presence of viral mutation(s) within the areas targeted by this assay, and inadequate number of viral copies(<138 copies/mL). A negative result must be combined with clinical observations, patient history, and epidemiological information. The expected result is Negative.  Fact Sheet for Patients:  EntrepreneurPulse.com.au  Fact Sheet for Healthcare Providers:  IncredibleEmployment.be  This test is no t yet approved or cleared by the [place] FDA and  has been authorized for detection and/or diagnosis of SARS-CoV-2 by FDA under an Emergency Use Authorization (EUA). This EUA will remain  in effect (meaning this test can be used) for the duration of the COVID-19 declaration under Section 564(b)(1) of the Act, 21 U.S.C.section 360bbb-3(b)(1), unless the authorization is terminated  or revoked sooner.       Influenza A by PCR NEGATIVE NEGATIVE Final   Influenza B by PCR NEGATIVE NEGATIVE Final  Comment: (NOTE) The Xpert Xpress SARS-CoV-2/FLU/RSV plus assay is intended as an aid in the diagnosis of influenza from Nasopharyngeal swab specimens and should not be used as a sole basis for treatment. Nasal washings and aspirates are unacceptable for Xpert Xpress SARS-CoV-2/FLU/RSV testing.  Fact Sheet for  Patients: EntrepreneurPulse.com.au  Fact Sheet for Healthcare Providers: IncredibleEmployment.be  This test is not yet approved or cleared by the [place] FDA and has been authorized for detection and/or diagnosis of SARS-CoV-2 by FDA under an Emergency Use Authorization (EUA). This EUA will remain in effect (meaning this test can be used) for the duration of the COVID-19 declaration under Section 564(b)(1) of the Act, 21 U.S.C. section 360bbb-3(b)(1), unless the authorization is terminated or revoked.  Performed at [facility], [address] [place], [place] [address]   Culture, blood (routine x 2)     Status: None (Preliminary result)   Collection Time: 11/30/21  1:32 AM   Specimen: BLOOD  Result Value Ref Range Status   Specimen Description BLOOD BLOOD LEFT FOREARM  Final   Special Requests   Final    BOTTLES DRAWN AEROBIC AND ANAEROBIC Blood Culture adequate volume   Culture   Final    NO GROWTH 2 DAYS Performed at [facility], [address], [place], [place] [address]    Report Status PENDING  Incomplete  Resp Panel by RT-PCR (Flu A&B, Covid) Nasopharyngeal Swab     Status: None   Collection Time: 11/30/21  3:11 AM   Specimen: Nasopharyngeal Swab; Nasopharyngeal(NP) swabs in vial transport medium  Result Value Ref Range Status   SARS Coronavirus 2 by RT PCR NEGATIVE NEGATIVE Final    Comment: (NOTE) SARS-CoV-2 target nucleic acids are NOT DETECTED.  The SARS-CoV-2 RNA is generally detectable in upper respiratory specimens during the acute phase of infection. The lowest concentration of SARS-CoV-2 viral copies this assay can detect is 138 copies/mL. A negative result does not preclude SARS-Cov-2 infection and should not be used as the sole basis for treatment or other patient management decisions. A negative result may occur with  improper specimen collection/handling, submission of specimen other than  nasopharyngeal swab, presence of viral mutation(s) within the areas targeted by this assay, and inadequate number of viral copies(<138 copies/mL). A negative result must be combined with clinical observations, patient history, and epidemiological information. The expected result is Negative.  Fact Sheet for Patients:  EntrepreneurPulse.com.au  Fact Sheet for Healthcare Providers:  IncredibleEmployment.be  This test is no t yet approved or cleared by the [place] FDA and  has been authorized for detection and/or diagnosis of SARS-CoV-2 by FDA under an Emergency Use Authorization (EUA). This EUA will remain  in effect (meaning this test can be used) for the duration of the COVID-19 declaration under Section 564(b)(1) of the Act, 21 U.S.C.section 360bbb-3(b)(1), unless the authorization is terminated  or revoked sooner.       Influenza A by PCR NEGATIVE NEGATIVE Final   Influenza B by PCR NEGATIVE NEGATIVE Final    Comment: (NOTE) The Xpert Xpress SARS-CoV-2/FLU/RSV plus assay is intended as an aid in the diagnosis of influenza from Nasopharyngeal swab specimens and should not be used as a sole basis for treatment. Nasal washings and aspirates are unacceptable for Xpert Xpress SARS-CoV-2/FLU/RSV testing.  Fact Sheet for Patients: EntrepreneurPulse.com.au  Fact Sheet for Healthcare Providers: IncredibleEmployment.be  This test is not yet approved or cleared by the [place] FDA and has been authorized for detection and/or diagnosis of  SARS-CoV-2 by FDA under an Emergency Use Authorization (EUA). This EUA will remain in effect (meaning this test can be used) for the duration of the COVID-19 declaration under Section 564(b)(1) of the Act, 21 U.S.C. section 360bbb-3(b)(1), unless the authorization is terminated or revoked.  Performed at [facility], [address] [place], [place]  [address]   Culture, blood (routine x 2)     Status: None (Preliminary result)   Collection Time: 11/30/21  6:35 AM   Specimen: BLOOD LEFT HAND  Result Value Ref Range Status   Specimen Description BLOOD LEFT HAND  Final   Special Requests   Final    BOTTLES DRAWN AEROBIC AND ANAEROBIC Blood Culture adequate volume   Culture   Final    NO GROWTH 2 DAYS Performed at [facility], [address], [place],  [address]    Report Status PENDING  Incomplete          Radiology Studies: No results found.      Scheduled Meds:  amitriptyline  100 mg Oral QHS   vitamin C  1,000 mg Oral Daily   aspirin EC  81 mg Oral Daily   buPROPion  150 mg Oral Daily   enoxaparin (LOVENOX) injection  0.5 mg/kg Subcutaneous Q24H   ezetimibe  10 mg Oral Daily   fentaNYL       FLUoxetine  40 mg Oral Daily   gabapentin  100 mg Oral QHS   guaiFENesin  600 mg Oral Daily   insulin aspart  0-15 Units Subcutaneous TID WC   insulin aspart  0-5 Units Subcutaneous QHS   metoprolol tartrate  12.5 mg Oral BID   pantoprazole  40 mg Oral Daily   rosuvastatin  40 mg Oral Daily   Continuous Infusions:  sodium chloride Stopped (12/02/21 0901)   ampicillin-sulbactam (UNASYN) IV 200 mL/hr at 12/02/21 0909     LOS: 2 days        Hosie Poisson, MD [facility]   To contact the attending provider between 7A-7P or the covering provider during after hours 7P-7A, please log into the web site [email or web address] and access using universal [place] password for that web site. If you do not have the password, please call the hospital operator.  12/02/2021, 3:53 PM

## 2021-12-03 DIAGNOSIS — R7881 Bacteremia: Secondary | ICD-10-CM

## 2021-12-03 DIAGNOSIS — D539 Nutritional anemia, unspecified: Secondary | ICD-10-CM | POA: Diagnosis not present

## 2021-12-03 DIAGNOSIS — L03113 Cellulitis of right upper limb: Secondary | ICD-10-CM | POA: Diagnosis not present

## 2021-12-03 DIAGNOSIS — L02519 Cutaneous abscess of unspecified hand: Secondary | ICD-10-CM | POA: Diagnosis not present

## 2021-12-03 DIAGNOSIS — L03119 Cellulitis of unspecified part of limb: Secondary | ICD-10-CM | POA: Diagnosis not present

## 2021-12-03 LAB — ECHOCARDIOGRAM COMPLETE
AR max vel: 1.36 cm2
AV Area VTI: 1.42 cm2
AV Area mean vel: 1.34 cm2
AV Mean grad: 29.3 mmHg
AV Peak grad: 44.8 mmHg
Ao pk vel: 3.35 m/s
Area-P 1/2: 2.99 cm2
P 1/2 time: 300 msec
S' Lateral: 2.3 cm

## 2021-12-03 LAB — BASIC METABOLIC PANEL
Anion gap: 6 (ref 5–15)
BUN: 6 mg/dL — ABNORMAL LOW (ref 8–23)
CO2: 24 mmol/L (ref 22–32)
Calcium: 8.7 mg/dL — ABNORMAL LOW (ref 8.9–10.3)
Chloride: 105 mmol/L (ref 98–111)
Creatinine, Ser: 0.78 mg/dL (ref 0.44–1.00)
GFR, Estimated: >60 mL/min (ref >60)
Glucose, Bld: 185 mg/dL — ABNORMAL HIGH (ref 70–99)
Potassium: 3.6 mmol/L (ref 3.5–5.1)
Sodium: 135 mmol/L (ref 135–145)

## 2021-12-03 LAB — CBC
HCT: 33.1 % — ABNORMAL LOW (ref 36.0–46.0)
Hemoglobin: 10.9 g/dL — ABNORMAL LOW (ref 12.0–15.0)
MCH: 33.7 pg (ref 26.0–34.0)
MCHC: 32.9 g/dL (ref 30.0–36.0)
MCV: 102.5 fL — ABNORMAL HIGH (ref 80.0–100.0)
Platelets: 276 10*3/uL (ref 150–400)
RBC: 3.23 MIL/uL — ABNORMAL LOW (ref 3.87–5.11)
RDW: 10.9 % — ABNORMAL LOW (ref 11.5–15.5)
WBC: 7.2 10*3/uL (ref 4.0–10.5)
nRBC: 0 % (ref 0.0–0.2)

## 2021-12-03 LAB — GLUCOSE, CAPILLARY
Glucose-Capillary: 139 mg/dL — ABNORMAL HIGH (ref 70–99)
Glucose-Capillary: 111 mg/dL — ABNORMAL HIGH (ref 70–99)
Glucose-Capillary: 133 mg/dL — ABNORMAL HIGH (ref 70–99)
Glucose-Capillary: 154 mg/dL — ABNORMAL HIGH (ref 70–99)

## 2021-12-03 MED ORDER — HYDROCODONE-ACETAMINOPHEN 5-325 MG PO TABS
1.0000 | ORAL_TABLET | ORAL | Status: DC | PRN
Start: 2021-12-03 — End: 2021-12-05
  Administered 2021-12-03 – 2021-12-04 (×6): 2 via ORAL
  Administered 2021-12-05: 09:00:00 1 via ORAL
  Filled 2021-12-03 (×5): qty 2
  Filled 2021-12-03: qty 1
  Filled 2021-12-03: qty 2

## 2021-12-03 NOTE — Progress Notes (Signed)
° °  Date of Admission:  11/30/2021   Total days of antibiotics   ID: Amanda [name] is a 70 y.o. female  Principal Problem:   Cellulitis of right wrist Active Problems:   Controlled type 2 diabetes mellitus with diabetic autonomic neuropathy, without long-term current use of insulin (HCC)   Essential hypertension   COPD with chronic bronchitis (HCC)   Nonrheumatic aortic valve stenosis   CAD (coronary artery disease), native coronary artery   Depression    Subjective: Doing well No fever Pain arm better Moving around in the room  Medications:   amitriptyline  100 mg Oral QHS   vitamin C  1,000 mg Oral Daily   aspirin EC  81 mg Oral Daily   buPROPion  150 mg Oral Daily   enoxaparin (LOVENOX) injection  0.5 mg/kg Subcutaneous Q24H   ezetimibe  10 mg Oral Daily   FLUoxetine  40 mg Oral Daily   gabapentin  100 mg Oral QHS   guaiFENesin  600 mg Oral Daily   insulin aspart  0-15 Units Subcutaneous TID WC   insulin aspart  0-5 Units Subcutaneous QHS   metoprolol tartrate  12.5 mg Oral BID   pantoprazole  40 mg Oral Daily   rosuvastatin  40 mg Oral Daily    Objective: Vital signs in last 24 hours: Temp:  [97 F (36.1 C)-99.1 F (37.3 C)] 98.7 F (37.1 C) (12/28 0837) Pulse Rate:  [69-84] 77 (12/28 0837) Resp:  [13-20] 16 (12/28 0837) BP: (102-150)/(51-87) 106/65 (12/28 0837) SpO2:  [93 %-99 %] 99 % (12/28 0837)  PHYSICAL EXAM:  General: Alert, cooperative, no distress, appears stated age.  Lungs: Clear to auscultation bilaterally. No Wheezing or Rhonchi. No rales. Heart: systolic murmur Extremities: rt arm surgical dressing Skin: No rashes or lesions. Or bruising Neurologic: Grossly non-focal  Lab Results Recent Labs    12/01/21 0732 12/03/21 1003  WBC 5.7 7.2  HGB 10.4* 10.9*  HCT 32.0* 33.1*  NA 137 135  K 3.5 3.6  CL 107 105  CO2 25 24  BUN 8 6*  CREATININE 0.74 0.78     Assessment/Plan:  Cat bite followed by abscess rt wrist area- S/p I/D On  unasyn On discharge will need augmentin for 2 more weeks  Pasteurella bacteremia and purulent cellulitis- treatment as above  Aortic stenosis- 2 d echo no vegetation-only calcified valve  Discussed the management with patient Await dressing change tomorrow to look at the wound

## 2021-12-03 NOTE — Evaluation (Signed)
Physical Therapy Evaluation Patient Details Name: Amanda [name] MRN: [id number] DOB: 1951-02-27 Today's Date: 12/03/2021  History of Present Illness  70 y.o. with a history of HTN, aortic stenosis, non obstructive CAD, COPD, Diet controlled DM, was recently hospitalized between 12/21-12/24 for rt hand/wrist pain and swelling from a cat bite . There was an abscess on the wrist area and it was I/D in the ED. That grew pasteurella  She had pasteurella bacteremia and after being on IV zosyn was discharged on PO augmentin.   She returned to the ED on 12/25 with worsening swelling and pain and redness of the rt wrist area and dorsum hand and restricted movt of the wrist  Clinical Impression  Pt received in bed, friend in room, agreeable to therapy. When asked about falls history, pt and friend both laugh. They state pt has an extensive history of falling, "at least" 4 in the last 6 months and "at least" 8 this past year; pt states she typically falls to the left and is due to LOB. Falls happen in different settings.  Bed mobility performed Mod I. STS and ambulation SUP, no AD used. Pt did demo increased trunk sway laterally during initial steps of ambulation. Balance was screened a little further with eyes closed, feet apart and turning. Pt has tendency to move quickly and experienced dizziness with the spinning. PT educated on slower movements. Pt has no PT needs while in the hospital however she would benefit from OPPT to address balance deficits and falls. Pt remained sitting EOB at ned of session, all needs met.      Recommendations for follow up therapy are one component of a multi-disciplinary discharge planning process, led by the attending physician.  Recommendations may be updated based on patient status, additional functional criteria and insurance authorization.  Follow Up Recommendations Outpatient PT    Assistance Recommended at Discharge PRN  Functional Status Assessment Patient has had  a recent decline in their functional status and demonstrates the ability to make significant improvements in function in a reasonable and predictable amount of time.  Equipment Recommendations  None recommended by PT    Recommendations for Other Services       Precautions / Restrictions Precautions Precautions: Fall Restrictions Weight Bearing Restrictions: No      Mobility  Bed Mobility Overal bed mobility: Modified Independent             General bed mobility comments: no physical assistance    Transfers Overall transfer level: Needs assistance Equipment used: None Transfers: Sit to/from Stand Sit to Stand: Supervision           General transfer comment: SUP for safety    Ambulation/Gait Ambulation/Gait assistance: Supervision Gait Distance (Feet): 20 Feet Assistive device: None Gait Pattern/deviations: Step-through pattern       General Gait Details: Mild sway/LOB during initial steps, pt corrected  Stairs            Wheelchair Mobility    Modified Rankin (Stroke Patients Only)       Balance Overall balance assessment: Needs assistance Sitting-balance support: Feet supported;No upper extremity supported Sitting balance-Leahy Scale: Normal     Standing balance support: No upper extremity supported Standing balance-Leahy Scale: Fair Standing balance comment: One LOB during initial ambulation with lateral trunk sway               High Level Balance Comments: assessed (1) eyes closed, (2) feet together, (3) 360* turn. Pt turns quickly, no LOB however  dizziness reported.             Pertinent Vitals/Pain Pain Assessment: 0-10 Pain Score: 5  Pain Location: R wrist/hand Pain Descriptors / Indicators: Discomfort;Grimacing;Guarding Pain Intervention(s): Limited activity within patient's tolerance;Monitored during session;Premedicated before session;Repositioned;Ice applied    Home Living Family/patient expects to be discharged  to:: Private residence Living Arrangements: Spouse/significant other;Children Available Help at Discharge: Family;Available 24 hours/day Type of Home: House Home Access: Stairs to enter Entrance Stairs-Rails: Right;Left Entrance Stairs-Number of Steps: 3 Alternate Level Stairs-Number of Steps: 3 steps down into Den but now a ramp is built to use Home Layout: One level Home Equipment: Conservation officer, nature (2 wheels);Cane - single point;Shower seat;Adaptive equipment;Rollator (4 wheels);Grab bars - tub/shower Additional Comments: Pt's spouse has parkinson's and just came home from SNF (due to femoral fx) before christmas. Daughter lives at home but is really unable to physically assist because she has health issues. 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    Prior Function Prior Level of Function : Independent/Modified Independent;History of Falls (last six months)             Mobility Comments: Pt reports ind without use of AD. 4 falls in the past 6 months, "at least" 8 in the past year - always loses balance to the left. ADLs Comments: Ind in all aspects of self care, cooking, cleaning, and driving.     Hand Dominance   Dominant Hand: Right    Extremity/Trunk Assessment   Upper Extremity Assessment Upper Extremity Assessment: RUE deficits/detail RUE: Unable to fully assess due to pain    Lower Extremity Assessment Lower Extremity Assessment: Overall WFL for tasks assessed;Generalized weakness (bilateral hip flexors weak, L more than R; very mild L knee extension weakness)    Cervical / Trunk Assessment Cervical / Trunk Assessment: Normal  Communication   Communication: No difficulties  Cognition Arousal/Alertness: Awake/alert Behavior During Therapy: WFL for tasks assessed/performed Overall Cognitive Status: Within Functional Limits for tasks assessed                                          General Comments      Exercises     Assessment/Plan    PT Assessment  All further PT needs can be met in the next venue of care  PT Problem List Decreased strength;Decreased safety awareness;Decreased balance       PT Treatment Interventions      PT Goals (Current goals can be found in the Care Plan section)  Acute Rehab PT Goals Patient Stated Goal: to go home PT Goal Formulation: With patient Time For Goal Achievement: 12/17/21 Potential to Achieve Goals: Good    Frequency     Barriers to discharge        Co-evaluation               AM-PAC PT "6 Clicks" Mobility  Outcome Measure Help needed turning from your back to your side while in a flat bed without using bedrails?: None Help needed moving from lying on your back to sitting on the side of a flat bed without using bedrails?: None Help needed moving to and from a bed to a chair (including a wheelchair)?: A Little Help needed standing up from a chair using your arms (e.g., wheelchair or bedside chair)?: A Little Help needed to walk in hospital room?: A Little Help needed climbing 3-5 steps with a  railing? : A Little 6 Click Score: 20    End of Session   Activity Tolerance: Patient tolerated treatment well Patient left: in bed;with call bell/phone within reach;with family/visitor present Nurse Communication: Mobility status PT Visit Diagnosis: Unsteadiness on feet (R26.81);History of falling (Z91.81);Muscle weakness (generalized) (M62.81)    Time: 6950-7225 PT Time Calculation (min) (ACUTE ONLY): 18 min   Charges:   PT Evaluation $PT Eval Moderate Complexity: 1 Mod PT Treatments $Therapeutic Activity: 8-22 mins        Patrina Levering PT, DPT 12/03/21 3:03 PM [phone]

## 2021-12-03 NOTE — Progress Notes (Signed)
Pharmacy Antibiotic Note  Amanda [name] is a 70 y.o. female admitted on 11/30/2021 with Cellulitis from a cat bite.  Wound culture had previously grown bacteremia and pasteurella wound. Pharmacy has been consulted for Unasyn dosing.  Day 4 of antibiotics. MD would like to continue IV abx currently.  Plan: Continue Unasyn 3 gm IV Q6H ordered to continue on 12/25 @ 0800.   Height: 5\' 8"  (172.7 cm) Weight: 106.6 kg (235 lb) IBW/kg (Calculated) : 63.9  Temp (24hrs), Avg:98.4 F (36.9 C), Min:97 F (36.1 C), Max:99.1 F (37.3 C)  Recent Labs  Lab 11/26/21 1623 11/28/21 0456 11/28/21 0857 11/29/21 0314 11/30/21 0132 12/01/21 0732 12/03/21 1003  WBC 8.9  --  6.6 8.1 8.1 5.7 7.2  CREATININE 0.69 0.57  --  0.54 0.70 0.74 0.78  LATICACIDVEN 0.9  --   --   --  1.1  --   --      Estimated Creatinine Clearance: 82.5 mL/min (by C-G formula based on SCr of 0.78 mg/dL).    Allergies  Allergen Reactions   Honey Bee Venom [Bee Venom] Anaphylaxis   Diazepam Other (See Comments)    REACTION: pt states she gets very angry and abusive verbally on this medication       Microbiology results:  BCx: NGTD   Thank you for allowing pharmacy to be a part of this patients care.  Margaret Cockerill A Maripat Borba 12/03/2021 2:02 PM

## 2021-12-03 NOTE — Progress Notes (Signed)
Occupational Therapy Treatment Patient Details Name: Amanda [name] MRN: [id number] DOB: Apr 27, 1951 Today's Date: 12/03/2021   History of present illness 70 y.o. with a history of HTN, aortic stenosis, non obstructive CAD, COPD, Diet controlled DM, was recently hospitalized between 12/21-12/24 for rt hand/wrist pain and swelling from a cat bite . There was an abscess on the wrist area and it was I/D in the ED. That grew pasteurella  She had pasteurella bacteremia and after being on IV zosyn was discharged on PO augmentin.   She returned to the ED on 12/25 with worsening swelling and pain and redness of the rt wrist area and dorsum hand and restricted movt of the wrist   OT comments  Upon entering the room, pt supine in bed and agreeable to OT intervention. Pt reports decreased edema but pain level similar to yesterday 6/10 pain in R hand/wrist. Pt with ace wrap bandage donned after I&D yesterday. Pt able to perform opposition with all digits except 5th. Pt demonstrates opposition exercises, finger flexion/extension exercises, and supination <>pronation x 10 reps. Exercises written down on paper handout for pt to perform throughout the day. Pt is agreeable to work on exercises. All needs within reach and pt remains in bed at this time.    Recommendations for follow up therapy are one component of a multi-disciplinary discharge planning process, led by the attending physician.  Recommendations may be updated based on patient status, additional functional criteria and insurance authorization.    Follow Up Recommendations  Follow physician's recommendations for discharge plan and follow up therapies    Assistance Recommended at Discharge Set up Supervision/Assistance  Equipment Recommendations  None recommended by OT       Precautions / Restrictions Precautions Precautions: Fall       Mobility Bed Mobility Overal bed mobility: Modified Independent             General bed mobility  comments: no physical assistance       Balance Overall balance assessment: Mild deficits observed, not formally tested                                         ADL either performed or assessed with clinical judgement     Vision Patient Visual Report: No change from baseline     \       Cognition Arousal/Alertness: Awake/alert Behavior During Therapy: WFL for tasks assessed/performed Overall Cognitive Status: Within Functional Limits for tasks assessed                                            \           Pertinent Vitals/ Pain       Pain Assessment: 0-10 Pain Score: 6  Pain Location: R wrist/hand Pain Descriptors / Indicators: Discomfort;Grimacing;Guarding Pain Intervention(s): Limited activity within patient's tolerance;Monitored during session;Premedicated before session;Repositioned;Ice applied  \   \    Frequency  Min 2X/week        Progress Toward Goals  OT Goals(current goals can now be found in the care plan section)  Progress towards OT goals: Progressing toward goals  Acute Rehab OT Goals Patient Stated Goal: to go home OT Goal Formulation: With patient Time For Goal Achievement: 12/16/21 Potential to Achieve Goals: Fair  Plan Discharge plan remains appropriate;Frequency remains appropriate    \   AM-PAC OT "6 Clicks" Daily Activity     Outcome Measure   Help from another person eating meals?: A Little Help from another person taking care of personal grooming?: A Little Help from another person toileting, which includes using toliet, bedpan, or urinal?: None Help from another person bathing (including washing, rinsing, drying)?: A Little Help from another person to put on and taking off regular upper body clothing?: A Little Help from another person to put on and taking off regular lower body clothing?: A Little 6 Click Score: 19    End of Session    OT Visit Diagnosis: Unsteadiness on feet  (R26.81);Repeated falls (R29.6)   Activity Tolerance Patient tolerated treatment well   Patient Left in bed;with call bell/phone within reach   Nurse Communication          Time: 1000-1025 OT Time Calculation (min): 25 min  Charges: OT General Charges $OT Visit: 1 Visit OT Treatments $Therapeutic Exercise: 23-37 mins  Darleen Crocker, MS, OTR/L , CBIS ascom [phone]  12/03/21, 1:23 PM

## 2021-12-03 NOTE — Anesthesia Postprocedure Evaluation (Signed)
Anesthesia Post Note  Patient: Amanda [name]  Procedure(s) Performed: INCISION AND DRAINAGE (Right: Wrist)  Patient location during evaluation: PACU Anesthesia Type: General Level of consciousness: awake and alert Pain management: pain level controlled Vital Signs Assessment: post-procedure vital signs reviewed and stable Respiratory status: spontaneous breathing, nonlabored ventilation, respiratory function stable and patient connected to nasal cannula oxygen Cardiovascular status: blood pressure returned to baseline and stable Postop Assessment: no apparent nausea or vomiting Anesthetic complications: no   No notable events documented.   Last Vitals:  Vitals:   12/02/21 1925 12/03/21 0519  BP: (!) 150/87 (!) 102/59  Pulse: 84 77  Resp: 16 16  Temp: 37.2 C 37.2 C  SpO2: 97% 97%    Last Pain:  Vitals:   12/03/21 0519  TempSrc: Oral  PainSc:                  Martha Clan

## 2021-12-03 NOTE — Care Management Important Message (Signed)
Important Message  Patient Details  Name: Amanda [name] MRN: [id number] Date of Birth: 1951-07-31   Medicare Important Message Given:  N/A - LOS <3 / Initial given by admissions     Dannette Barbara 12/03/2021, 12:19 PM

## 2021-12-03 NOTE — Progress Notes (Addendum)
PROGRESS NOTE    Amanda [name]  EYC:[id number] DOB: 04/28/1951 DOA: 11/30/2021 PCP: Owens Loffler, MD   Assessment & Plan:   Principal Problem:   Cellulitis of right wrist Active Problems:   Controlled type 2 diabetes mellitus with diabetic autonomic neuropathy, without long-term current use of insulin (HCC)   Essential hypertension   COPD with chronic bronchitis (HCC)   Nonrheumatic aortic valve stenosis   CAD (coronary artery disease), native coronary artery   Depression     Abscess on the dorsal aspect of the right wrist: secondary to cat bite.  S/p I&D of right wrist. Wound cx growing rare pasteurella multocida. Continue on IV unasyn as per ID. Norco, IV morphine prn for pain   Pasturella bacteremia: continue on IV unasyn as per ID. Echo pending    DM2: well controlled, HbA1c 6.4. Continue on SSI w/ accuchecks   Macrocytic anemia: will check B12 & folate levels   COPD: w/o exacerbation. Continue on bronchodilators    HTN: continue on metoprolol  Depression: severity unknown. Continue on home dose of fluoxetine   Obesity: BMI 35.7. Would benefit from weight loss    DVT prophylaxis: lovenox  Code Status: full  Family Communication:  Disposition Plan: likely d/c back home   Level of care: Med-Surg  Status is: Inpatient  Remains inpatient appropriate because: severity of illness, still requiring IV abxs     Consultants:  ID Ortho surg   Procedures:   Antimicrobials: unasyn   Subjective: Pt c/o right wrist pain   Objective: Vitals:   12/02/21 1925 12/03/21 0519 12/03/21 0828 12/03/21 0837  BP: (!) 150/87 (!) 102/59  106/65  Pulse: 84 77 80 77  Resp: 16 16  16   Temp: 99 F (37.2 C) 98.9 F (37.2 C)  98.7 F (37.1 C)  TempSrc: Oral Oral  Oral  SpO2: 97% 97%  99%  Weight:      Height:        Intake/Output Summary (Last 24 hours) at 12/03/2021 0838 Last data filed at 12/03/2021 0300 Gross per 24 hour  Intake 1113.03 ml  Output 5 ml   Net 1108.03 ml   Filed Weights   11/29/21 2102  Weight: 106.6 kg    Examination:  General exam: Appears calm and comfortable  Respiratory system: Clear to auscultation. Respiratory effort normal. Cardiovascular system: S1 & S2 +. No rubs, gallops or clicks.  Gastrointestinal system: Abdomen is obese, soft and nontender. Normal bowel sounds heard. Central nervous system: Alert and oriented. Moves all extremities  Psychiatry: Judgement and insight appear normal. Mood & affect appropriate.     Data Reviewed: I have personally reviewed following labs and imaging studies  CBC: Recent Labs  Lab 11/26/21 1623 11/28/21 0857 11/29/21 0314 11/30/21 0132 12/01/21 0732  WBC 8.9 6.6 8.1 8.1 5.7  NEUTROABS 7.0  --   --  5.8  --   HGB 12.0 10.9* 11.4* 10.9* 10.4*  HCT 35.6* 32.3* 34.0* 32.5* 32.0*  MCV 101.4* 99.1 100.0 102.2* 100.6*  PLT 201 218 236 264 149   Basic Metabolic Panel: Recent Labs  Lab 11/26/21 1623 11/28/21 0456 11/29/21 0314 11/30/21 0132 12/01/21 0732  NA 133*  --  135 130* 137  K 3.4*  --  3.7 3.5 3.5  CL 99  --  104 97* 107  CO2 26  --  25 24 25   GLUCOSE 153*  --  151* 145* 136*  BUN 11  --  9 11 8   CREATININE 0.69 0.57  0.54 0.70 0.74  CALCIUM 9.4  --  8.8* 9.2 8.5*   GFR: Estimated Creatinine Clearance: 82.5 mL/min (by C-G formula based on SCr of 0.74 mg/dL). Liver Function Tests: Recent Labs  Lab 11/26/21 1623 11/29/21 0314  AST 24 32  ALT 20 40  ALKPHOS 48 78  BILITOT 0.8 0.7  PROT 7.3 6.3*  ALBUMIN 3.7 3.0*   No results for input(s): LIPASE, AMYLASE in the last 168 hours. No results for input(s): AMMONIA in the last 168 hours. Coagulation Profile: No results for input(s): INR, PROTIME in the last 168 hours. Cardiac Enzymes: No results for input(s): CKTOTAL, CKMB, CKMBINDEX, TROPONINI in the last 168 hours. BNP (last 3 results) No results for input(s): PROBNP in the last 8760 hours. HbA1C: No results for input(s): HGBA1C in the last  72 hours. CBG: Recent Labs  Lab 12/02/21 1143 12/02/21 1442 12/02/21 1603 12/02/21 2140 12/03/21 0826  GLUCAP 103* 112* 127* 108* 139*   Lipid Profile: No results for input(s): CHOL, HDL, LDLCALC, TRIG, CHOLHDL, LDLDIRECT in the last 72 hours. Thyroid Function Tests: No results for input(s): TSH, T4TOTAL, FREET4, T3FREE, THYROIDAB in the last 72 hours. Anemia Panel: No results for input(s): VITAMINB12, FOLATE, FERRITIN, TIBC, IRON, RETICCTPCT in the last 72 hours. Sepsis Labs: Recent Labs  Lab 11/26/21 1623 11/30/21 0132  LATICACIDVEN 0.9 1.1    Recent Results (from the past 240 hour(s))  Culture, blood (routine x 2)     Status: Abnormal   Collection Time: 11/26/21  4:23 PM   Specimen: BLOOD  Result Value Ref Range Status   Specimen Description   Final    BLOOD BLLA Performed at [facility], [address], [place], [place] [address]    Special Requests   Final    BOTTLES DRAWN AEROBIC AND ANAEROBIC [place] Performed at [facility], [address], [place], [place] [address]    Culture  Setup Time   Final    GRAM NEGATIVE RODS AEROBIC BOTTLE ONLY CRITICAL VALUE NOTED.  VALUE IS CONSISTENT WITH PREVIOUSLY REPORTED AND CALLED VALUE. Performed at [facility], [address] [place], [place] [address]    Culture (A)  Final    PASTEURELLA MULTOCIDA Usually susceptible to penicillin and other beta lactam agents,quinolones,macrolides and tetracyclines. Performed at [facility], [place] [address], [place], [place] [address]    Report Status 11/29/2021 FINAL  Final  Culture, blood (routine x 2)     Status: Abnormal   Collection Time: 11/26/21  4:23 PM   Specimen: BLOOD  Result Value Ref Range Status   Specimen Description   Final    BLOOD LAC Performed at [facility], [address] [place], [place] [address]    Special Requests   Final    BOTTLES DRAWN AEROBIC AND ANAEROBIC BCAV Performed at [facility], [address], [place], [place] [address]    Culture  Setup Time   Final    GRAM NEGATIVE RODS AEROBIC BOTTLE ONLY Organism ID to follow CRITICAL RESULT CALLED TO, READ BACK BY AND VERIFIED WITHNilsa Nutting JJHERD [id number] 11/27/21 HNM Performed at [facility], [address] [place], [place] [address]    Culture (A)  Final    PASTEURELLA MULTOCIDA Usually susceptible to penicillin and other beta lactam agents,quinolones,macrolides and tetracyclines. Performed at [facility], [place] [address], [place], [place] [address]    Report Status 11/29/2021 FINAL  Final  Blood Culture ID Panel (Reflexed)  Status: None   Collection Time: 11/26/21  4:23 PM  Result Value Ref Range Status   Enterococcus faecalis NOT DETECTED NOT DETECTED Final   Enterococcus Faecium NOT DETECTED NOT DETECTED Final   Listeria monocytogenes NOT DETECTED NOT DETECTED Final   Staphylococcus species NOT DETECTED NOT DETECTED Final   Staphylococcus aureus (BCID) NOT DETECTED NOT DETECTED Final   Staphylococcus epidermidis NOT DETECTED NOT DETECTED Final   Staphylococcus lugdunensis NOT DETECTED NOT DETECTED Final   Streptococcus species NOT DETECTED NOT DETECTED Final   Streptococcus agalactiae NOT DETECTED NOT DETECTED Final   Streptococcus pneumoniae NOT DETECTED NOT DETECTED Final   Streptococcus pyogenes NOT DETECTED NOT DETECTED Final   A.calcoaceticus-baumannii NOT DETECTED NOT DETECTED Final   Bacteroides fragilis NOT DETECTED NOT DETECTED Final   Enterobacterales NOT DETECTED NOT DETECTED Final   Enterobacter cloacae complex NOT DETECTED NOT DETECTED Final   Escherichia coli NOT DETECTED NOT DETECTED Final   Klebsiella aerogenes NOT DETECTED NOT DETECTED Final   Klebsiella oxytoca NOT DETECTED NOT DETECTED Final   Klebsiella pneumoniae NOT DETECTED NOT DETECTED Final   Proteus species NOT DETECTED NOT DETECTED Final   Salmonella species NOT DETECTED NOT DETECTED Final   Serratia marcescens  NOT DETECTED NOT DETECTED Final   Haemophilus influenzae NOT DETECTED NOT DETECTED Final   Neisseria meningitidis NOT DETECTED NOT DETECTED Final   Pseudomonas aeruginosa NOT DETECTED NOT DETECTED Final   Stenotrophomonas maltophilia NOT DETECTED NOT DETECTED Final   Candida albicans NOT DETECTED NOT DETECTED Final   Candida auris NOT DETECTED NOT DETECTED Final   Candida glabrata NOT DETECTED NOT DETECTED Final   Candida krusei NOT DETECTED NOT DETECTED Final   Candida parapsilosis NOT DETECTED NOT DETECTED Final   Candida tropicalis NOT DETECTED NOT DETECTED Final   Cryptococcus neoformans/gattii NOT DETECTED NOT DETECTED Final   CTX-M ESBL NOT DETECTED NOT DETECTED Final   Carbapenem resistance IMP NOT DETECTED NOT DETECTED Final   Carbapenem resistance KPC NOT DETECTED NOT DETECTED Final   Methicillin resistance mecA/C NOT DETECTED NOT DETECTED Final   Meth resistant mecA/C and MREJ NOT DETECTED NOT DETECTED Final   Carbapenem resistance NDM NOT DETECTED NOT DETECTED Final   Carbapenem resist OXA 48 LIKE NOT DETECTED NOT DETECTED Final   Vancomycin resistance NOT DETECTED NOT DETECTED Final   Carbapenem resistance VIM NOT DETECTED NOT DETECTED Final    Comment: Performed at [facility], [place], [place] [address]  Aerobic Culture w Gram Stain (superficial specimen)     Status: None   Collection Time: 11/26/21  8:24 PM   Specimen: Abscess  Result Value Ref Range Status   Specimen Description   Final    ABSCESS Performed at [facility], [address], [place], [place] [address]    Special Requests   Final    NONE Performed at [facility], [address] [place], [place] [address]    Gram Stain   Final    ABUNDANT WBC PRESENT, PREDOMINANTLY PMN NO ORGANISMS SEEN    Culture   Final    RARE PASTEURELLA MULTOCIDA Usually susceptible to penicillin and other beta lactam agents,quinolones,macrolides and tetracyclines. Performed at  [facility], [place] [address] [place], [place] [address]    Report Status 11/28/2021 FINAL  Final  Resp Panel by RT-PCR (Flu A&B, Covid) Nasopharyngeal Swab     Status: None   Collection Time: 11/26/21  8:26 PM   Specimen: Nasopharyngeal Swab; Nasopharyngeal(NP) swabs in vial  transport medium  Result Value Ref Range Status   SARS Coronavirus 2 by RT PCR NEGATIVE NEGATIVE Final    Comment: (NOTE) SARS-CoV-2 target nucleic acids are NOT DETECTED.  The SARS-CoV-2 RNA is generally detectable in upper respiratory specimens during the acute phase of infection. The lowest concentration of SARS-CoV-2 viral copies this assay can detect is 138 copies/mL. A negative result does not preclude SARS-Cov-2 infection and should not be used as the sole basis for treatment or other patient management decisions. A negative result may occur with  improper specimen collection/handling, submission of specimen other than nasopharyngeal swab, presence of viral mutation(s) within the areas targeted by this assay, and inadequate number of viral copies(<138 copies/mL). A negative result must be combined with clinical observations, patient history, and epidemiological information. The expected result is Negative.  Fact Sheet for Patients:  EntrepreneurPulse.com.au  Fact Sheet for Healthcare Providers:  IncredibleEmployment.be  This test is no t yet approved or cleared by the [place] FDA and  has been authorized for detection and/or diagnosis of SARS-CoV-2 by FDA under an Emergency Use Authorization (EUA). This EUA will remain  in effect (meaning this test can be used) for the duration of the COVID-19 declaration under Section 564(b)(1) of the Act, 21 U.S.C.section 360bbb-3(b)(1), unless the authorization is terminated  or revoked sooner.       Influenza A by PCR NEGATIVE NEGATIVE Final   Influenza B by PCR NEGATIVE NEGATIVE Final    Comment: (NOTE) The  Xpert Xpress SARS-CoV-2/FLU/RSV plus assay is intended as an aid in the diagnosis of influenza from Nasopharyngeal swab specimens and should not be used as a sole basis for treatment. Nasal washings and aspirates are unacceptable for Xpert Xpress SARS-CoV-2/FLU/RSV testing.  Fact Sheet for Patients: EntrepreneurPulse.com.au  Fact Sheet for Healthcare Providers: IncredibleEmployment.be  This test is not yet approved or cleared by the [place] FDA and has been authorized for detection and/or diagnosis of SARS-CoV-2 by FDA under an Emergency Use Authorization (EUA). This EUA will remain in effect (meaning this test can be used) for the duration of the COVID-19 declaration under Section 564(b)(1) of the Act, 21 U.S.C. section 360bbb-3(b)(1), unless the authorization is terminated or revoked.  Performed at [facility], [address] [place], [place] [address]   Culture, blood (routine x 2)     Status: None (Preliminary result)   Collection Time: 11/30/21  1:32 AM   Specimen: BLOOD  Result Value Ref Range Status   Specimen Description BLOOD BLOOD LEFT FOREARM  Final   Special Requests   Final    BOTTLES DRAWN AEROBIC AND ANAEROBIC Blood Culture adequate volume   Culture   Final    NO GROWTH 2 DAYS Performed at [facility], [address] [place], [place] [address]    Report Status PENDING  Incomplete  Resp Panel by RT-PCR (Flu A&B, Covid) Nasopharyngeal Swab     Status: None   Collection Time: 11/30/21  3:11 AM   Specimen: Nasopharyngeal Swab; Nasopharyngeal(NP) swabs in vial transport medium  Result Value Ref Range Status   SARS Coronavirus 2 by RT PCR NEGATIVE NEGATIVE Final    Comment: (NOTE) SARS-CoV-2 target nucleic acids are NOT DETECTED.  The SARS-CoV-2 RNA is generally detectable in upper respiratory specimens during the acute phase of infection. The lowest concentration of SARS-CoV-2 viral copies this  assay can detect is 138 copies/mL. A negative result does not preclude SARS-Cov-2 infection and should not be used as the sole basis for  treatment or other patient management decisions. A negative result may occur with  improper specimen collection/handling, submission of specimen other than nasopharyngeal swab, presence of viral mutation(s) within the areas targeted by this assay, and inadequate number of viral copies(<138 copies/mL). A negative result must be combined with clinical observations, patient history, and epidemiological information. The expected result is Negative.  Fact Sheet for Patients:  EntrepreneurPulse.com.au  Fact Sheet for Healthcare Providers:  IncredibleEmployment.be  This test is no t yet approved or cleared by the [place] FDA and  has been authorized for detection and/or diagnosis of SARS-CoV-2 by FDA under an Emergency Use Authorization (EUA). This EUA will remain  in effect (meaning this test can be used) for the duration of the COVID-19 declaration under Section 564(b)(1) of the Act, 21 U.S.C.section 360bbb-3(b)(1), unless the authorization is terminated  or revoked sooner.       Influenza A by PCR NEGATIVE NEGATIVE Final   Influenza B by PCR NEGATIVE NEGATIVE Final    Comment: (NOTE) The Xpert Xpress SARS-CoV-2/FLU/RSV plus assay is intended as an aid in the diagnosis of influenza from Nasopharyngeal swab specimens and should not be used as a sole basis for treatment. Nasal washings and aspirates are unacceptable for Xpert Xpress SARS-CoV-2/FLU/RSV testing.  Fact Sheet for Patients: EntrepreneurPulse.com.au  Fact Sheet for Healthcare Providers: IncredibleEmployment.be  This test is not yet approved or cleared by the [place] FDA and has been authorized for detection and/or diagnosis of SARS-CoV-2 by FDA under an Emergency Use Authorization (EUA). This EUA will  remain in effect (meaning this test can be used) for the duration of the COVID-19 declaration under Section 564(b)(1) of the Act, 21 U.S.C. section 360bbb-3(b)(1), unless the authorization is terminated or revoked.  Performed at [facility], [address] [place], [place] [address]   Culture, blood (routine x 2)     Status: None (Preliminary result)   Collection Time: 11/30/21  6:35 AM   Specimen: BLOOD LEFT HAND  Result Value Ref Range Status   Specimen Description BLOOD LEFT HAND  Final   Special Requests   Final    BOTTLES DRAWN AEROBIC AND ANAEROBIC Blood Culture adequate volume   Culture   Final    NO GROWTH 2 DAYS Performed at [facility], [address] [place], [place] [address]    Report Status PENDING  Incomplete         Radiology Studies: No results found.      Scheduled Meds:  amitriptyline  100 mg Oral QHS   vitamin C  1,000 mg Oral Daily   aspirin EC  81 mg Oral Daily   buPROPion  150 mg Oral Daily   enoxaparin (LOVENOX) injection  0.5 mg/kg Subcutaneous Q24H   ezetimibe  10 mg Oral Daily   FLUoxetine  40 mg Oral Daily   gabapentin  100 mg Oral QHS   guaiFENesin  600 mg Oral Daily   insulin aspart  0-15 Units Subcutaneous TID WC   insulin aspart  0-5 Units Subcutaneous QHS   metoprolol tartrate  12.5 mg Oral BID   pantoprazole  40 mg Oral Daily   rosuvastatin  40 mg Oral Daily   Continuous Infusions:  sodium chloride Stopped (12/02/21 0901)   ampicillin-sulbactam (UNASYN) IV 3 g (12/03/21 0341)     LOS: 3 days    Time spent: 32 mins     Wyvonnia Dusky, MD [facility] Pager [phone]  If 7PM-7AM, please contact night-coverage 12/03/2021, 8:38  AM  ° °

## 2021-12-03 NOTE — Progress Notes (Signed)
Plan on changing dressing tomorrow and removing Penrose drain.  Will let everyone know if infection is controlled where she may build be discharged tomorrow.

## 2021-12-04 DIAGNOSIS — R7881 Bacteremia: Secondary | ICD-10-CM | POA: Diagnosis not present

## 2021-12-04 DIAGNOSIS — I10 Essential (primary) hypertension: Secondary | ICD-10-CM | POA: Diagnosis not present

## 2021-12-04 DIAGNOSIS — L03113 Cellulitis of right upper limb: Secondary | ICD-10-CM | POA: Diagnosis not present

## 2021-12-04 LAB — GLUCOSE, CAPILLARY
Glucose-Capillary: 116 mg/dL — ABNORMAL HIGH (ref 70–99)
Glucose-Capillary: 142 mg/dL — ABNORMAL HIGH (ref 70–99)
Glucose-Capillary: 108 mg/dL — ABNORMAL HIGH (ref 70–99)
Glucose-Capillary: 174 mg/dL — ABNORMAL HIGH (ref 70–99)

## 2021-12-04 LAB — CBC
HCT: 32.5 % — ABNORMAL LOW (ref 36.0–46.0)
Hemoglobin: 10.4 g/dL — ABNORMAL LOW (ref 12.0–15.0)
MCH: 32.5 pg (ref 26.0–34.0)
MCHC: 32 g/dL (ref 30.0–36.0)
MCV: 101.6 fL — ABNORMAL HIGH (ref 80.0–100.0)
Platelets: 283 10*3/uL (ref 150–400)
RBC: 3.2 MIL/uL — ABNORMAL LOW (ref 3.87–5.11)
RDW: 10.9 % — ABNORMAL LOW (ref 11.5–15.5)
WBC: 7.2 10*3/uL (ref 4.0–10.5)
nRBC: 0 % (ref 0.0–0.2)

## 2021-12-04 LAB — BASIC METABOLIC PANEL
Anion gap: 9 (ref 5–15)
BUN: 6 mg/dL — ABNORMAL LOW (ref 8–23)
CO2: 23 mmol/L (ref 22–32)
Calcium: 8.5 mg/dL — ABNORMAL LOW (ref 8.9–10.3)
Chloride: 104 mmol/L (ref 98–111)
Creatinine, Ser: 0.64 mg/dL (ref 0.44–1.00)
GFR, Estimated: >60 mL/min (ref >60)
Glucose, Bld: 139 mg/dL — ABNORMAL HIGH (ref 70–99)
Potassium: 3.3 mmol/L — ABNORMAL LOW (ref 3.5–5.1)
Sodium: 136 mmol/L (ref 135–145)

## 2021-12-04 LAB — VITAMIN B12: Vitamin B-12: 300 pg/mL (ref 180–914)

## 2021-12-04 LAB — FOLATE: Folate: 24 ng/mL (ref >5.9)

## 2021-12-04 MED ORDER — HYDROCODONE-ACETAMINOPHEN 5-325 MG PO TABS: 1.0000 | ORAL_TABLET | Freq: Three times a day (TID) | ORAL | 0 refills | Status: AC | PRN

## 2021-12-04 MED ORDER — AMOXICILLIN-POT CLAVULANATE 875-125 MG PO TABS: 1.0000 | ORAL_TABLET | Freq: Two times a day (BID) | ORAL | 0 refills | Status: DC

## 2021-12-04 MED ORDER — POTASSIUM CHLORIDE CRYS ER 20 MEQ PO TBCR
40.0000 meq | EXTENDED_RELEASE_TABLET | Freq: Once | ORAL | Status: AC
  Administered 2021-12-04: 10:00:00 40 meq via ORAL
  Filled 2021-12-04: qty 2

## 2021-12-04 NOTE — Progress Notes (Signed)
° °  Subjective: 2 Days Post-Op Procedure(s) (LRB): INCISION AND DRAINAGE (Right) Patient reports pain as mild.   Patient is well, and has had no acute complaints or problems We will continue therapy today.   Objective: Vital signs in last 24 hours: Temp:  [97.7 F (36.5 C)-98.3 F (36.8 C)] 97.7 F (36.5 C) (12/29 0751) Pulse Rate:  [74-82] 80 (12/29 0751) Resp:  [16-18] 18 (12/29 0751) BP: (117-122)/(49-106) 117/61 (12/29 0751) SpO2:  [98 %-100 %] 98 % (12/29 0751)  Intake/Output from previous day: 12/28 0701 - 12/29 0700 In: 800 [P.O.:480; IV Piggyback:320] Out: -  Intake/Output this shift: Total I/O In: 240 [P.O.:240] Out: -   Recent Labs    12/03/21 1003 12/04/21 0510  HGB 10.9* 10.4*   Recent Labs    12/03/21 1003 12/04/21 0510  WBC 7.2 7.2  RBC 3.23* 3.20*  HCT 33.1* 32.5*  PLT 276 283   Recent Labs    12/03/21 1003 12/04/21 0510  NA 135 136  K 3.6 3.3*  CL 105 104  CO2 24 23  BUN 6* 6*  CREATININE 0.78 0.64  GLUCOSE 185* 139*  CALCIUM 8.7* 8.5*   No results for input(s): LABPT, INR in the last 72 hours.  EXAM General - Patient is Alert, Appropriate, and Oriented RU Extremity - Neurologically intact Sensation intact distally Intact pulses distally Dorsal abscess drain removed. Minimal swelling with no drainage. No erythema or warmth. No induration. Minimal tenderness to palpation. Lacks 2 cm full composite fist. Dressing - dressing C/D/I Motor Function - intact, RUE digits well on exam.   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 2013: nl LV sys fxn, mild MR, no evidence of pulm htn; b. TTE 8/17: EF 60-65%, no RWMA, nl LV dia fxn,  mild AS, mod AI   CAD (coronary artery disease), native coronary artery 11/26/2020   Controlled type 2 diabetes mellitus with diabetic autonomic neuropathy, without long-term current use of insulin ([place]) 08/10/2007   Qualifier: Diagnosis of  By: Council Mechanic  MD, Hilaria Ota    COPD (chronic obstructive pulmonary disease) ([place]) 10/30/2016   Depression    GAD (generalized anxiety disorder)    GERD (gastroesophageal reflux disease)    Hiatal hernia with gastroesophageal reflux 1997   Hyperlipidemia    Hypertension    Osteoporosis    Tobacco abuse     Assessment/Plan:   2 Days Post-Op Procedure(s) (LRB): INCISION AND DRAINAGE (Right) Principal Problem:   Cellulitis of right wrist Active Problems:   Controlled type 2 diabetes mellitus with diabetic autonomic neuropathy, without long-term current use of insulin (HCC)   Essential hypertension   COPD with chronic bronchitis (HCC)   Nonrheumatic aortic valve stenosis   CAD (coronary artery disease), native coronary artery   Depression  Estimated body mass index is 35.73 kg/m as calculated from the following:   Height as of this encounter: 5\' 8"  (1.727 m).   Weight as of this encounter: 106.6 kg.  Pain and swelling much improved. Continue with abx per ID.  Ok to follow up with [facility] ortho next week for wound check Continue with daily dressing changes.   Ronney Asters, PA-C [place] 12/04/2021, 11:19 AM

## 2021-12-04 NOTE — Discharge Summary (Addendum)
Physician Discharge Summary  Amanda [name] NAT:[id number] DOB: [birth year] DOA: 11/30/2021  PCP: Owens Loffler, MD  Admit date: 11/30/2021 Discharge date: 12/05/21  Admitted From: home  Disposition:  home   Recommendations for Outpatient Follow-up:  Follow up with PCP in 1-2 weeks F/u w/ ortho surg, Dr. Rudene Christians, w/in 5 days F/u w/ ID, Dr, Delaine Lame, on 12/16/21  Home Health: no  Equipment/Devices:  Discharge Condition: stable  CODE STATUS: full  Diet recommendation: Heart Healthy / Carb Modified  Brief/Interim Summary: HPI was taken from Dr. Damita Dunnings: Amanda [name] is a 70 y.o. female with medical history significant for DM, HTN, CAD, COPD, hospitalized from 12/21-12/24 with abscess of the right wrist secondary to infected cat bite with positive blood cultures for Pasteurella, treated with  I&D on 12/21, Zosyn->Unasyn x3 days and discharged on Augmentin x7 days who returns to the ED with increased pain swelling and redness of the right wrist .  She states the swelling doubled in size compared to when she was discharged.  She denies fever or chills.   ED course: BP 149/69 with otherwise normal vitals Blood work: WBC 8.1 with lactic acid 1.1 Hemoglobin 10.9 Sodium 130   Imaging: Ultrasound right hand with diffuse soft tissue edema no abscess   Patient started on clindamycin and Zosyn.  Hospitalist consulted for admission.      Review of Systems: As per HPI otherwise all other systems on review of systems negative.    As per Dr. Karleen Hampshire: Amanda [name] is a 70 y.o. female with medical history significant for DM, HTN, CAD, COPD, hospitalized from 12/21-12/24 with abscess of the right wrist secondary to infected cat bite with positive blood cultures for Pasteurella, treated with  I&D on 12/21, Zosyn->Unasyn x3 days and discharged on Augmentin x7 days who returns to the ED with increased pain swelling and redness of the right wrist . Orthopedic Surgeon consulted as the pain and  swelling hasn't improved, referred to Dr Rudene Christians for repeat Incision and Drainage of the dorsal abscess, scheduled for 12/02/21. ID consulted for antibiotic use.    As per Dr. Jimmye Norman 12/28-12/29/22: Pt's drain that was placed by ortho surg was removed prior to d/c. Pt was d/c home w/ po augmentin x 2 weeks as per ID. Pt will f/u w/ ID as well as ortho surg. For more information, please see previous progress/consult notes.     Discharge Diagnoses:  Principal Problem:   Cellulitis of right wrist Active Problems:   Controlled type 2 diabetes mellitus with diabetic autonomic neuropathy, without long-term current use of insulin (HCC)   Essential hypertension   COPD with chronic bronchitis (HCC)   Nonrheumatic aortic valve stenosis   CAD (coronary artery disease), native coronary artery   Depression  Abscess on the dorsal aspect of the right wrist: secondary to cat bite.  S/p I&D of right wrist. Wound cx growing rare pasteurella multocida. Abxs changed to po augmentin x 2 weeks as per ID. Norco, IV morphine prn for pain   Pasturella bacteremia: continue on IV unasyn as per ID. Echo does not show any vegetations, EF 42-70%, normal diastolic function   Hypokalemia: potassium given    DM2: well controlled, HbA1c 6.4. Continue on SSI w/ accuchecks   Macrocytic anemia: B12 & folate are WNL. H&H are stable   COPD: w/o exacerbation. Continue on bronchodilators    HTN: continue on metoprolol  Depression: severity unknown. Continue on home dose of fluoxetine   Obesity: BMI 35.7. Would benefit from  weight loss   Discharge Instructions  Discharge Instructions     Diet - low sodium heart healthy   Complete by: As directed    Diet Carb Modified   Complete by: As directed    Discharge instructions   Complete by: As directed    F/u w/ ortho surg, Dr. Rudene Christians, within 5 days. F/u w/ PCP in 1-2 weeks   Discharge wound care:   Complete by: As directed    Continue w/ daily wound dressing changes  as per ortho surg   Increase activity slowly   Complete by: As directed       Allergies as of 12/04/2021       Reactions   Honey Bee Venom [bee Venom] Anaphylaxis   Diazepam Other (See Comments)   REACTION: pt states she gets very angry and abusive verbally on this medication        Medication List     TAKE these medications    acetaminophen 650 MG CR tablet Commonly known as: TYLENOL Take 650 mg by mouth 3 times/day as needed-between meals & bedtime (for headaches).   albuterol 108 (90 Base) MCG/ACT inhaler Commonly known as: VENTOLIN HFA Inhale 2 puffs into the lungs every 4 (four) hours as needed for wheezing or shortness of breath.   amitriptyline 50 MG tablet Commonly known as: ELAVIL TAKE 2 TABLETS BY MOUTH DAILY AT BEDTIME.   amoxicillin-clavulanate 875-125 MG tablet Commonly known as: Augmentin Take 1 tablet by mouth 2 (two) times daily for 14 days.   aspirin EC 81 MG tablet Take 81 mg by mouth daily.   buPROPion 150 MG 24 hr tablet Commonly known as: WELLBUTRIN XL TAKE 1 TABLET (150 MG TOTAL) BY MOUTH DAILY.   calcium carbonate 500 MG chewable tablet Commonly known as: TUMS - dosed in mg elemental calcium Chew 2 tablets by mouth daily as needed for indigestion or heartburn.   clonazePAM 0.5 MG tablet Commonly known as: KLONOPIN Take 1 tablet by mouth twice daily as needed   cyclobenzaprine 10 MG tablet Commonly known as: FLEXERIL TAKE 1 TABLET THREE TIMES DAILY AS NEEDED FOR MUSCLE SPAMS   ezetimibe 10 MG tablet Commonly known as: ZETIA Take 1 tablet (10 mg total) by mouth daily.   FLUoxetine 40 MG capsule Commonly known as: PROZAC TAKE 1 CAPSULE (40 MG TOTAL) BY MOUTH DAILY.   gabapentin 100 MG capsule Commonly known as: NEURONTIN Take 1 capsule (100 mg total) by mouth at bedtime.   guaiFENesin 600 MG 12 hr tablet Commonly known as: MUCINEX Take 600 mg by mouth daily.   HYDROcodone-acetaminophen 5-325 MG tablet Commonly known as:  NORCO/VICODIN Take 1 tablet by mouth every 8 (eight) hours as needed for up to 5 days for moderate pain.   metoprolol tartrate 25 MG tablet Commonly known as: LOPRESSOR TAKE 1/2 TABLET TWICE DAILY   multivitamin tablet Take 1 tablet by mouth daily.   naproxen sodium 220 MG tablet Commonly known as: ALEVE Take 220 mg by mouth daily.   pantoprazole 40 MG tablet Commonly known as: PROTONIX TAKE 1 TABLET 30 MINUTES PRIOR TO BREAKFAST   polycarbophil 625 MG tablet Commonly known as: FIBERCON Take 625 mg by mouth daily as needed. FIBER THERAPY   rosuvastatin 40 MG tablet Commonly known as: CRESTOR TAKE 1 TABLET EVERY DAY   Simethicone 125 MG Caps Take 125 mg by mouth daily as needed (gas).   vitamin C 1000 MG tablet Take 1,000 mg by mouth daily.   Vitamin D  1000 units capsule Take 1,000 Units by mouth daily.   vitamin E 180 MG (400 UNITS) capsule Take 400 Units by mouth daily.               Discharge Care Instructions  (From admission, onward)           Start     Ordered   12/04/21 0000  Discharge wound care:       Comments: Continue w/ daily wound dressing changes as per ortho surg   12/04/21 1509            Follow-up Information     Duanne Guess, PA-C Follow up in 1 week(s).   Specialties: Orthopedic Surgery, Emergency Medicine Contact information: [place] [address] [phone]         Tsosie Billing, MD. Go today.   Specialty: Infectious Diseases Why: F/u appt on 12/16/21 Contact information: [place] [address] [phone]                Allergies  Allergen Reactions   Honey Bee Venom [Bee Venom] Anaphylaxis   Diazepam Other (See Comments)    REACTION: pt states she gets very angry and abusive verbally on this medication     Consultations: Ortho surg ID   Procedures/Studies: DG Wrist Complete Right  Result Date: 11/30/2021 CLINICAL DATA:  Right wrist  pain.  Cat bite. EXAM: RIGHT WRIST - COMPLETE 3+ VIEW COMPARISON:  Right hand radiograph dated 11/26/2021. FINDINGS: There is no acute fracture or dislocation. The bones are osteopenic. Degenerative changes of the base of the thumb. There is soft tissue swelling primarily involving the dorsum of the hand and wrist. No radiopaque foreign object or soft tissue gas. IMPRESSION: 1. No acute fracture or dislocation. 2. Soft tissue swelling. Electronically Signed   By: Anner Crete M.D.   On: 11/30/2021 01:52   DG Wrist Complete Right  Result Date: 11/26/2021 CLINICAL DATA:  A 70 year old female presents with pain and swelling of the RIGHT arm and hand since Friday. EXAM: RIGHT WRIST - COMPLETE 3+ VIEW COMPARISON:  Hand evaluation of the same date. Also RIGHT wrist from May of 2021. FINDINGS: Diffuse soft tissue swelling about the wrist best appreciated on the lateral projection. No visible fracture or sign of dislocation. No sign of acute bony destruction. IMPRESSION: Diffuse soft tissue swelling about the right wrist. No acute osseous abnormality or radiographic evidence of bone destruction. Findings are nonspecific. Would however suggest correlation with any history of trauma or signs of infection. Electronically Signed   By: Zetta Bills M.D.   On: 11/26/2021 15:15   CT WRIST RIGHT W CONTRAST  Result Date: 11/26/2021 CLINICAL DATA:  Soft tissue infection suspected, wrist. EXAM: CT OF THE UPPER RIGHT EXTREMITY WITH CONTRAST TECHNIQUE: Multidetector CT imaging of the upper right extremity was performed according to the standard protocol following intravenous contrast administration. CONTRAST:  75mL OMNIPAQUE IOHEXOL 300 MG/ML  SOLN COMPARISON:  Radiograph performed earlier on the same date FINDINGS: Bones/Joint/Cartilage No evidence of fracture or dislocation. No cortical erosion or periosteal reaction concerning for osteomyelitis. Mild degenerative changes of the first carpometacarpal joint. Radiocarpal  and intercarpal joints are intact. Ligaments Suboptimally assessed by CT. Muscles and Tendons Normal muscle density in size. No intramuscular collection or abscess. Soft tissues There is a subcutaneous peripherally enhancing fluid collection on the dorsal/ulnar aspect of the wrist measuring approximately 2.3 x 0.8 x 1.4 cm (series 7 image 155, series 9, image  81). Generalized subcutaneous soft tissue edema and skin thickening of the wrist and forearm. IMPRESSION: 1. Peripherally enhancing fluid collection about the dorsal/ulnar aspect of the wrist measuring at least 2.3 x 0.8 x 1.4 cm concerning for abscess. Clinical correlation is suggested. 2. Generalized subcutaneous soft tissue edema and skin thickening of the wrist and forearm. 3. No acute osseous abnormality. Electronically Signed   By: Keane Police D.O.   On: 11/26/2021 17:46   DG Hand Complete Right  Result Date: 11/26/2021 CLINICAL DATA:  Pain and swelling about the hand in a 70 year old female. EXAM: RIGHT HAND - COMPLETE 3+ VIEW COMPARISON:  Remote imaging from Apr 17, 2020 and prior wrist imaging of the same date. FINDINGS: Soft tissue swelling about the the volar and dorsal aspect of the wrist. No visible fracture. No signs of bony destruction. Degenerative changes are noted about the hand in the first carpometacarpal joint and distal interphalangeal joints. IMPRESSION: Soft tissue swelling about the volar and dorsal aspect of the wrist. No visible fracture or bony destruction. Correlate with any history of trauma or signs of infection. Degenerative changes about the wrist and hand. Electronically Signed   By: Zetta Bills M.D.   On: 11/26/2021 15:17   ECHOCARDIOGRAM COMPLETE  Result Date: 12/03/2021    ECHOCARDIOGRAM REPORT   Patient Name:   Amanda [name] Date of Exam: 12/02/2021 Medical Rec #:  [id number]        Height:       68.0 in Accession #:    [id number]       Weight:       235.0 lb Date of Birth:  1951/08/11        BSA:           2.189 m Patient Age:    7 years         BP:           150/87 mmHg Patient Gender: F                HR:           78 bpm. Exam Location:  [facility] Procedure: 2D Echo, Cardiac Doppler and Color Doppler                             MODIFIED REPORT: This report was modified by Kate Sable MD on 12/03/2021 due to AS.  Indications:     R78.81 Bacteremia  History:         Patient has prior history of Echocardiogram examinations, most                  recent 11/07/2020. CAD, COPD; Risk Factors:Hypertension,                  Diabetes, Dyslipidemia and Current Smoker.  Sonographer:     Cresenciano Lick RDCS Referring Phys:  [place] Diagnosing Phys: Kate Sable MD IMPRESSIONS  1. Left ventricular ejection fraction, by estimation, is 65 to 70%. The left ventricle has normal function. The left ventricle has no regional wall motion abnormalities. There is mild left ventricular hypertrophy. Left ventricular diastolic parameters were normal.  2. Right ventricular systolic function is normal. The right ventricular size is normal.  3. The mitral valve is degenerative. No evidence of mitral valve regurgitation.  4. The aortic valve is calcified. Aortic valve regurgitation is mild to moderate. Moderate aortic valve stenosis. Aortic valve mean gradient measures 29.2 mmHg.  Aortic valve Vmax measures 3.35 m/s.  5. The inferior vena cava is normal in size with greater than 50% respiratory variability, suggesting right atrial pressure of 3 mmHg. Conclusion(s)/Recommendation(s): No evidence of valvular vegetations on this transthoracic echocardiogram. Consider a transesophageal echocardiogram to exclude infective endocarditis if clinically indicated. FINDINGS  Left Ventricle: Left ventricular ejection fraction, by estimation, is 65 to 70%. The left ventricle has normal function. The left ventricle has no regional wall motion abnormalities. The left ventricular internal cavity size was normal in size. There is  mild  left ventricular hypertrophy. Left ventricular diastolic parameters were normal. Right Ventricle: The right ventricular size is normal. No increase in right ventricular wall thickness. Right ventricular systolic function is normal. Left Atrium: Left atrial size was normal in size. Right Atrium: Right atrial size was normal in size. Pericardium: There is no evidence of pericardial effusion. Mitral Valve: The mitral valve is degenerative in appearance. Mild to moderate mitral annular calcification. No evidence of mitral valve regurgitation. Tricuspid Valve: The tricuspid valve is normal in structure. Tricuspid valve regurgitation is not demonstrated. Aortic Valve: The aortic valve is calcified. Aortic valve regurgitation is mild to moderate. Aortic regurgitation PHT measures 300 msec. Moderate aortic stenosis is present. Aortic valve mean gradient measures 29.2 mmHg. Aortic valve peak gradient measures 44.8 mmHg. Aortic valve area, by VTI measures 1.42 cm. Pulmonic Valve: The pulmonic valve was not well visualized. Pulmonic valve regurgitation is not visualized. Aorta: The aortic root is normal in size and structure. Venous: The inferior vena cava is normal in size with greater than 50% respiratory variability, suggesting right atrial pressure of 3 mmHg. IAS/Shunts: No atrial level shunt detected by color flow Doppler.  LEFT VENTRICLE PLAX 2D LVIDd:         4.60 cm   Diastology LVIDs:         2.30 cm   LV e' medial:    7.62 cm/s LV PW:         1.30 cm   LV E/e' medial:  13.5 LV IVS:        1.30 cm   LV e' lateral:   9.03 cm/s LVOT diam:     2.30 cm   LV E/e' lateral: 11.4 LV SV:         103 LV SV Index:   47 LVOT Area:     4.15 cm  RIGHT VENTRICLE             IVC RV Basal diam:  4.00 cm     IVC diam: 1.20 cm RV S prime:     14.30 cm/s TAPSE (M-mode): 2.2 cm LEFT ATRIUM             Index        RIGHT ATRIUM           Index LA diam:        5.30 cm 2.42 cm/m   RA Area:     13.00 cm LA Vol (A2C):   71.7 ml 32.76 ml/m   RA Volume:   31.60 ml  14.44 ml/m LA Vol (A4C):   49.4 ml 22.57 ml/m LA Biplane Vol: 61.6 ml 28.14 ml/m  AORTIC VALVE AV Area (Vmax):    1.36 cm AV Area (Vmean):   1.34 cm AV Area (VTI):     1.42 cm AV Vmax:           334.75 cm/s AV Vmean:          261.000 cm/s AV  VTI:            0.722 m AV Peak Grad:      44.8 mmHg AV Mean Grad:      29.2 mmHg LVOT Vmax:         109.50 cm/s LVOT Vmean:        84.350 cm/s LVOT VTI:          0.248 m LVOT/AV VTI ratio: 0.34 AI PHT:            300 msec  AORTA Ao Root diam: 3.40 cm MITRAL VALVE MV Area (PHT): 2.99 cm     SHUNTS MV Decel Time: 254 msec     Systemic VTI:  0.25 m MV E velocity: 103.00 cm/s  Systemic Diam: 2.30 cm MV A velocity: 124.00 cm/s MV E/A ratio:  0.83 Kate Sable MD Electronically signed by Kate Sable MD Signature Date/Time: 12/03/2021/3:27:25 PM    Final (Updated)    [place] RT UPPER EXTREM LTD SOFT TISSUE NON VASCULAR  Result Date: 11/30/2021 CLINICAL DATA:  Right wrist swelling after cat scratch. EXAM: ULTRASOUND right UPPER EXTREMITY LIMITED TECHNIQUE: Ultrasound examination of the upper extremity soft tissues was performed in the area of clinical concern. COMPARISON:  Right wrist radiograph dated 11/30/2021 FINDINGS: There is diffuse edema and hyperemia in the superficial soft tissues which may represent cellulitis. No drainable fluid collection/abscess. IMPRESSION: Diffuse soft tissue edema.  No abscess. Electronically Signed   By: Anner Crete M.D.   On: 11/30/2021 02:17   (Echo, Carotid, EGD, Colonoscopy, ERCP)    Subjective: Pt c/o fatigue    Discharge Exam: Vitals:   12/04/21 0402 12/04/21 0751  BP: (!) 121/49 117/61  Pulse: 76 80  Resp: 18 18  Temp: 98.3 F (36.8 C) 97.7 F (36.5 C)  SpO2: 100% 98%   Vitals:   12/03/21 1727 12/03/21 2214 12/04/21 0402 12/04/21 0751  BP: 122/82 (!) 120/106 (!) 121/49 117/61  Pulse: 74 82 76 80  Resp: 16 16 18 18   Temp: 98.2 F (36.8 C) 98.3 F (36.8 C) 98.3 F (36.8 C)  97.7 F (36.5 C)  TempSrc: Oral     SpO2: 100% 99% 100% 98%  Weight:      Height:        General: Pt is alert, awake, not in acute distress Cardiovascular: S1/S2 +, no rubs, no gallops Respiratory: CTA bilaterally, no wheezing, no rhonchi Abdominal: Soft, NT, obese, bowel sounds + Extremities: no cyanosis    The results of significant diagnostics from this hospitalization (including imaging, microbiology, ancillary and laboratory) are listed below for reference.     Microbiology: Recent Results (from the past 240 hour(s))  Culture, blood (routine x 2)     Status: Abnormal   Collection Time: 11/26/21  4:23 PM   Specimen: BLOOD  Result Value Ref Range Status   Specimen Description   Final    BLOOD BLLA Performed at [facility], [address], [place], [place] [address]    Special Requests   Final    BOTTLES DRAWN AEROBIC AND ANAEROBIC [place] Performed at [facility], [address], [place], [place] [address]    Culture  Setup Time   Final    GRAM NEGATIVE RODS AEROBIC BOTTLE ONLY CRITICAL VALUE NOTED.  VALUE IS CONSISTENT WITH PREVIOUSLY REPORTED AND CALLED VALUE. Performed at [facility], [address] [place], [place] [address]    Culture (A)  Final    PASTEURELLA MULTOCIDA Usually susceptible to penicillin and  other beta lactam agents,quinolones,macrolides and tetracyclines. Performed at [facility], [place] [address] [place], [place] [address]    Report Status 11/29/2021 FINAL  Final  Culture, blood (routine x 2)     Status: Abnormal   Collection Time: 11/26/21  4:23 PM   Specimen: BLOOD  Result Value Ref Range Status   Specimen Description   Final    BLOOD LAC Performed at [facility], [address] [place], [place] [address]    Special Requests   Final    BOTTLES DRAWN AEROBIC AND ANAEROBIC BCAV Performed at [facility], [address] [place], [place] [address]    Culture  Setup Time   Final     GRAM NEGATIVE RODS AEROBIC BOTTLE ONLY Organism ID to follow CRITICAL RESULT CALLED TO, READ BACK BY AND VERIFIED WITHNilsa Nutting ATFTDD 2202 11/27/21 HNM Performed at [facility], [address] [place], [place] [address]    Culture (A)  Final    PASTEURELLA MULTOCIDA Usually susceptible to penicillin and other beta lactam agents,quinolones,macrolides and tetracyclines. Performed at [facility], [place] [address], [place], [place] [address]    Report Status 11/29/2021 FINAL  Final  Blood Culture ID Panel (Reflexed)     Status: None   Collection Time: 11/26/21  4:23 PM  Result Value Ref Range Status   Enterococcus faecalis NOT DETECTED NOT DETECTED Final   Enterococcus Faecium NOT DETECTED NOT DETECTED Final   Listeria monocytogenes NOT DETECTED NOT DETECTED Final   Staphylococcus species NOT DETECTED NOT DETECTED Final   Staphylococcus aureus (BCID) NOT DETECTED NOT DETECTED Final   Staphylococcus epidermidis NOT DETECTED NOT DETECTED Final   Staphylococcus lugdunensis NOT DETECTED NOT DETECTED Final   Streptococcus species NOT DETECTED NOT DETECTED Final   Streptococcus agalactiae NOT DETECTED NOT DETECTED Final   Streptococcus pneumoniae NOT DETECTED NOT DETECTED Final   Streptococcus pyogenes NOT DETECTED NOT DETECTED Final   A.calcoaceticus-baumannii NOT DETECTED NOT DETECTED Final   Bacteroides fragilis NOT DETECTED NOT DETECTED Final   Enterobacterales NOT DETECTED NOT DETECTED Final   Enterobacter cloacae complex NOT DETECTED NOT DETECTED Final   Escherichia coli NOT DETECTED NOT DETECTED Final   Klebsiella aerogenes NOT DETECTED NOT DETECTED Final   Klebsiella oxytoca NOT DETECTED NOT DETECTED Final   Klebsiella pneumoniae NOT DETECTED NOT DETECTED Final   Proteus species NOT DETECTED NOT DETECTED Final   Salmonella species NOT DETECTED NOT DETECTED Final   Serratia marcescens NOT DETECTED NOT DETECTED Final   Haemophilus influenzae NOT DETECTED  NOT DETECTED Final   Neisseria meningitidis NOT DETECTED NOT DETECTED Final   Pseudomonas aeruginosa NOT DETECTED NOT DETECTED Final   Stenotrophomonas maltophilia NOT DETECTED NOT DETECTED Final   Candida albicans NOT DETECTED NOT DETECTED Final   Candida auris NOT DETECTED NOT DETECTED Final   Candida glabrata NOT DETECTED NOT DETECTED Final   Candida krusei NOT DETECTED NOT DETECTED Final   Candida parapsilosis NOT DETECTED NOT DETECTED Final   Candida tropicalis NOT DETECTED NOT DETECTED Final   Cryptococcus neoformans/gattii NOT DETECTED NOT DETECTED Final   CTX-M ESBL NOT DETECTED NOT DETECTED Final   Carbapenem resistance IMP NOT DETECTED NOT DETECTED Final   Carbapenem resistance KPC NOT DETECTED NOT DETECTED Final   Methicillin resistance mecA/C NOT DETECTED NOT DETECTED Final   Meth resistant mecA/C and MREJ NOT DETECTED NOT DETECTED Final   Carbapenem resistance NDM NOT DETECTED NOT DETECTED Final   Carbapenem resist OXA 48 LIKE  NOT DETECTED NOT DETECTED Final   Vancomycin resistance NOT DETECTED NOT DETECTED Final   Carbapenem resistance VIM NOT DETECTED NOT DETECTED Final    Comment: Performed at [facility], [address] [place], [place] [address]  Aerobic Culture w Gram Stain (superficial specimen)     Status: None   Collection Time: 11/26/21  8:24 PM   Specimen: Abscess  Result Value Ref Range Status   Specimen Description   Final    ABSCESS Performed at [facility], [address] [place], [place] [address]    Special Requests   Final    NONE Performed at [facility], [address] [place], [place] [address]    Gram Stain   Final    ABUNDANT WBC PRESENT, PREDOMINANTLY PMN NO ORGANISMS SEEN    Culture   Final    RARE PASTEURELLA MULTOCIDA Usually susceptible to penicillin and other beta lactam agents,quinolones,macrolides and tetracyclines. Performed at [facility], [place] [address] [place], [place] [address]     Report Status 11/28/2021 FINAL  Final  Resp Panel by RT-PCR (Flu A&B, Covid) Nasopharyngeal Swab     Status: None   Collection Time: 11/26/21  8:26 PM   Specimen: Nasopharyngeal Swab; Nasopharyngeal(NP) swabs in vial transport medium  Result Value Ref Range Status   SARS Coronavirus 2 by RT PCR NEGATIVE NEGATIVE Final    Comment: (NOTE) SARS-CoV-2 target nucleic acids are NOT DETECTED.  The SARS-CoV-2 RNA is generally detectable in upper respiratory specimens during the acute phase of infection. The lowest concentration of SARS-CoV-2 viral copies this assay can detect is 138 copies/mL. A negative result does not preclude SARS-Cov-2 infection and should not be used as the sole basis for treatment or other patient management decisions. A negative result may occur with  improper specimen collection/handling, submission of specimen other than nasopharyngeal swab, presence of viral mutation(s) within the areas targeted by this assay, and inadequate number of viral copies(<138 copies/mL). A negative result must be combined with clinical observations, patient history, and epidemiological information. The expected result is Negative.  Fact Sheet for Patients:  EntrepreneurPulse.com.au  Fact Sheet for Healthcare Providers:  IncredibleEmployment.be  This test is no t yet approved or cleared by the [place] FDA and  has been authorized for detection and/or diagnosis of SARS-CoV-2 by FDA under an Emergency Use Authorization (EUA). This EUA will remain  in effect (meaning this test can be used) for the duration of the COVID-19 declaration under Section 564(b)(1) of the Act, 21 U.S.C.section 360bbb-3(b)(1), unless the authorization is terminated  or revoked sooner.       Influenza A by PCR NEGATIVE NEGATIVE Final   Influenza B by PCR NEGATIVE NEGATIVE Final    Comment: (NOTE) The Xpert Xpress SARS-CoV-2/FLU/RSV plus assay is intended as an aid in  the diagnosis of influenza from Nasopharyngeal swab specimens and should not be used as a sole basis for treatment. Nasal washings and aspirates are unacceptable for Xpert Xpress SARS-CoV-2/FLU/RSV testing.  Fact Sheet for Patients: EntrepreneurPulse.com.au  Fact Sheet for Healthcare Providers: IncredibleEmployment.be  This test is not yet approved or cleared by the [place] FDA and has been authorized for detection and/or diagnosis of SARS-CoV-2 by FDA under an Emergency Use Authorization (EUA). This EUA will remain in effect (meaning this test can be used) for the duration of the COVID-19 declaration under Section 564(b)(1) of the Act, 21 U.S.C. section 360bbb-3(b)(1), unless the authorization is terminated or revoked.  Performed at [facility]  [facility], [address] [place], [place] [address]   Culture, blood (routine x 2)     Status: None (Preliminary result)   Collection Time: 11/30/21  1:32 AM   Specimen: BLOOD  Result Value Ref Range Status   Specimen Description BLOOD BLOOD LEFT FOREARM  Final   Special Requests   Final    BOTTLES DRAWN AEROBIC AND ANAEROBIC Blood Culture adequate volume   Culture   Final    NO GROWTH 4 DAYS Performed at [facility], [address] [place], [place] [address]    Report Status PENDING  Incomplete  Resp Panel by RT-PCR (Flu A&B, Covid) Nasopharyngeal Swab     Status: None   Collection Time: 11/30/21  3:11 AM   Specimen: Nasopharyngeal Swab; Nasopharyngeal(NP) swabs in vial transport medium  Result Value Ref Range Status   SARS Coronavirus 2 by RT PCR NEGATIVE NEGATIVE Final    Comment: (NOTE) SARS-CoV-2 target nucleic acids are NOT DETECTED.  The SARS-CoV-2 RNA is generally detectable in upper respiratory specimens during the acute phase of infection. The lowest concentration of SARS-CoV-2 viral copies this assay can detect is 138 copies/mL. A negative result does not preclude  SARS-Cov-2 infection and should not be used as the sole basis for treatment or other patient management decisions. A negative result may occur with  improper specimen collection/handling, submission of specimen other than nasopharyngeal swab, presence of viral mutation(s) within the areas targeted by this assay, and inadequate number of viral copies(<138 copies/mL). A negative result must be combined with clinical observations, patient history, and epidemiological information. The expected result is Negative.  Fact Sheet for Patients:  EntrepreneurPulse.com.au  Fact Sheet for Healthcare Providers:  IncredibleEmployment.be  This test is no t yet approved or cleared by the [place] FDA and  has been authorized for detection and/or diagnosis of SARS-CoV-2 by FDA under an Emergency Use Authorization (EUA). This EUA will remain  in effect (meaning this test can be used) for the duration of the COVID-19 declaration under Section 564(b)(1) of the Act, 21 U.S.C.section 360bbb-3(b)(1), unless the authorization is terminated  or revoked sooner.       Influenza A by PCR NEGATIVE NEGATIVE Final   Influenza B by PCR NEGATIVE NEGATIVE Final    Comment: (NOTE) The Xpert Xpress SARS-CoV-2/FLU/RSV plus assay is intended as an aid in the diagnosis of influenza from Nasopharyngeal swab specimens and should not be used as a sole basis for treatment. Nasal washings and aspirates are unacceptable for Xpert Xpress SARS-CoV-2/FLU/RSV testing.  Fact Sheet for Patients: EntrepreneurPulse.com.au  Fact Sheet for Healthcare Providers: IncredibleEmployment.be  This test is not yet approved or cleared by the [place] FDA and has been authorized for detection and/or diagnosis of SARS-CoV-2 by FDA under an Emergency Use Authorization (EUA). This EUA will remain in effect (meaning this test can be used) for the duration of  the COVID-19 declaration under Section 564(b)(1) of the Act, 21 U.S.C. section 360bbb-3(b)(1), unless the authorization is terminated or revoked.  Performed at [facility], [address] [place],  [address]   Culture, blood (routine x 2)     Status: None (Preliminary result)   Collection Time: 11/30/21  6:35 AM   Specimen: BLOOD LEFT HAND  Result Value Ref Range Status   Specimen Description BLOOD LEFT HAND  Final   Special Requests   Final    BOTTLES DRAWN AEROBIC AND ANAEROBIC Blood Culture adequate volume   Culture   Final  NO GROWTH 4 DAYS Performed at [facility], [address] [place], [place] [address]    Report Status PENDING  Incomplete     Labs: BNP (last 3 results) No results for input(s): BNP in the last 8760 hours. Basic Metabolic Panel: Recent Labs  Lab 11/29/21 0314 11/30/21 0132 12/01/21 0732 12/03/21 1003 12/04/21 0510  NA 135 130* 137 135 136  K 3.7 3.5 3.5 3.6 3.3*  CL 104 97* 107 105 104  CO2 25 24 25 24 23   GLUCOSE 151* 145* 136* 185* 139*  BUN 9 11 8  6* 6*  CREATININE 0.54 0.70 0.74 0.78 0.64  CALCIUM 8.8* 9.2 8.5* 8.7* 8.5*   Liver Function Tests: Recent Labs  Lab 11/29/21 0314  AST 32  ALT 40  ALKPHOS 78  BILITOT 0.7  PROT 6.3*  ALBUMIN 3.0*   No results for input(s): LIPASE, AMYLASE in the last 168 hours. No results for input(s): AMMONIA in the last 168 hours. CBC: Recent Labs  Lab 11/29/21 0314 11/30/21 0132 12/01/21 0732 12/03/21 1003 12/04/21 0510  WBC 8.1 8.1 5.7 7.2 7.2  NEUTROABS  --  5.8  --   --   --   HGB 11.4* 10.9* 10.4* 10.9* 10.4*  HCT 34.0* 32.5* 32.0* 33.1* 32.5*  MCV 100.0 102.2* 100.6* 102.5* 101.6*  PLT 236 264 260 276 283   Cardiac Enzymes: No results for input(s): CKTOTAL, CKMB, CKMBINDEX, TROPONINI in the last 168 hours. BNP: Invalid input(s): POCBNP CBG: Recent Labs  Lab 12/03/21 1218 12/03/21 1724 12/03/21 2211 12/04/21 0749 12/04/21 1147  GLUCAP 111*  133* 154* 116* 142*   D-Dimer No results for input(s): DDIMER in the last 72 hours. Hgb A1c No results for input(s): HGBA1C in the last 72 hours. Lipid Profile No results for input(s): CHOL, HDL, LDLCALC, TRIG, CHOLHDL, LDLDIRECT in the last 72 hours. Thyroid function studies No results for input(s): TSH, T4TOTAL, T3FREE, THYROIDAB in the last 72 hours.  Invalid input(s): FREET3 Anemia work up Recent Labs    12/04/21 0510  VITAMINB12 300  FOLATE 24.0   Urinalysis    Component Value Date/Time   COLORURINE YELLOW (A) 10/24/2019 0559   APPEARANCEUR CLEAR (A) 10/24/2019 0559   LABSPEC 1.009 10/24/2019 0559   PHURINE 7.0 10/24/2019 0559   GLUCOSEU NEGATIVE 10/24/2019 0559   HGBUR SMALL (A) 10/24/2019 0559   BILIRUBINUR NEGATIVE 10/24/2019 0559   KETONESUR NEGATIVE 10/24/2019 0559   PROTEINUR NEGATIVE 10/24/2019 0559   NITRITE NEGATIVE 10/24/2019 0559   LEUKOCYTESUR MODERATE (A) 10/24/2019 0559   Sepsis Labs Invalid input(s): PROCALCITONIN,  WBC,  LACTICIDVEN Microbiology Recent Results (from the past 240 hour(s))  Culture, blood (routine x 2)     Status: Abnormal   Collection Time: 11/26/21  4:23 PM   Specimen: BLOOD  Result Value Ref Range Status   Specimen Description   Final    BLOOD BLLA Performed at [facility], [address] [place], [place] [address]    Special Requests   Final    BOTTLES DRAWN AEROBIC AND ANAEROBIC BCHV Performed at [facility], [address], [place], [place] [address]    Culture  Setup Time   Final    GRAM NEGATIVE RODS AEROBIC BOTTLE ONLY CRITICAL VALUE NOTED.  VALUE IS CONSISTENT WITH PREVIOUSLY REPORTED AND CALLED VALUE. Performed at [facility], [address] [place], [place] [address]    Culture (A)  Final    PASTEURELLA MULTOCIDA Usually susceptible to penicillin and other beta lactam  agents,quinolones,macrolides and tetracyclines. Performed at [facility], [address] [address],  [place], [place] [address]    Report Status 11/29/2021 FINAL  Final  Culture, blood (routine x 2)     Status: Abnormal   Collection Time: 11/26/21  4:23 PM   Specimen: BLOOD  Result Value Ref Range Status   Specimen Description   Final    BLOOD LAC Performed at [facility], [address], [place], [place] [address]    Special Requests   Final    BOTTLES DRAWN AEROBIC AND ANAEROBIC BCAV Performed at [facility], [address], [place], [place] [address]    Culture  Setup Time   Final    GRAM NEGATIVE RODS AEROBIC BOTTLE ONLY Organism ID to follow CRITICAL RESULT CALLED TO, READ BACK BY AND VERIFIED WITHNilsa Nutting MPNTIR [id number] 11/27/21 HNM Performed at [facility], [address] [place], [place] [address]    Culture (A)  Final    PASTEURELLA MULTOCIDA Usually susceptible to penicillin and other beta lactam agents,quinolones,macrolides and tetracyclines. Performed at [facility], [place] [address], [place], [place] [address]    Report Status 11/29/2021 FINAL  Final  Blood Culture ID Panel (Reflexed)     Status: None   Collection Time: 11/26/21  4:23 PM  Result Value Ref Range Status   Enterococcus faecalis NOT DETECTED NOT DETECTED Final   Enterococcus Faecium NOT DETECTED NOT DETECTED Final   Listeria monocytogenes NOT DETECTED NOT DETECTED Final   Staphylococcus species NOT DETECTED NOT DETECTED Final   Staphylococcus aureus (BCID) NOT DETECTED NOT DETECTED Final   Staphylococcus epidermidis NOT DETECTED NOT DETECTED Final   Staphylococcus lugdunensis NOT DETECTED NOT DETECTED Final   Streptococcus species NOT DETECTED NOT DETECTED Final   Streptococcus agalactiae NOT DETECTED NOT DETECTED Final   Streptococcus pneumoniae NOT DETECTED NOT DETECTED Final   Streptococcus pyogenes NOT DETECTED NOT DETECTED Final   A.calcoaceticus-baumannii NOT DETECTED NOT DETECTED Final   Bacteroides fragilis NOT DETECTED NOT DETECTED Final    Enterobacterales NOT DETECTED NOT DETECTED Final   Enterobacter cloacae complex NOT DETECTED NOT DETECTED Final   Escherichia coli NOT DETECTED NOT DETECTED Final   Klebsiella aerogenes NOT DETECTED NOT DETECTED Final   Klebsiella oxytoca NOT DETECTED NOT DETECTED Final   Klebsiella pneumoniae NOT DETECTED NOT DETECTED Final   Proteus species NOT DETECTED NOT DETECTED Final   Salmonella species NOT DETECTED NOT DETECTED Final   Serratia marcescens NOT DETECTED NOT DETECTED Final   Haemophilus influenzae NOT DETECTED NOT DETECTED Final   Neisseria meningitidis NOT DETECTED NOT DETECTED Final   Pseudomonas aeruginosa NOT DETECTED NOT DETECTED Final   Stenotrophomonas maltophilia NOT DETECTED NOT DETECTED Final   Candida albicans NOT DETECTED NOT DETECTED Final   Candida auris NOT DETECTED NOT DETECTED Final   Candida glabrata NOT DETECTED NOT DETECTED Final   Candida krusei NOT DETECTED NOT DETECTED Final   Candida parapsilosis NOT DETECTED NOT DETECTED Final   Candida tropicalis NOT DETECTED NOT DETECTED Final   Cryptococcus neoformans/gattii NOT DETECTED NOT DETECTED Final   CTX-M ESBL NOT DETECTED NOT DETECTED Final   Carbapenem resistance IMP NOT DETECTED NOT DETECTED Final   Carbapenem resistance KPC NOT DETECTED NOT DETECTED Final   Methicillin resistance mecA/C NOT DETECTED NOT DETECTED Final   Meth resistant mecA/C and MREJ NOT DETECTED NOT DETECTED Final   Carbapenem resistance NDM NOT DETECTED NOT DETECTED Final   Carbapenem resist OXA 48 LIKE NOT DETECTED NOT  DETECTED Final   Vancomycin resistance NOT DETECTED NOT DETECTED Final   Carbapenem resistance VIM NOT DETECTED NOT DETECTED Final    Comment: Performed at [facility], [address] [place], [place] [address]  Aerobic Culture w Gram Stain (superficial specimen)     Status: None   Collection Time: 11/26/21  8:24 PM   Specimen: Abscess  Result Value Ref Range Status   Specimen Description   Final     ABSCESS Performed at [facility], [address], [place], [place] [address]    Special Requests   Final    NONE Performed at [facility], [address] [place], [place] [address]    Gram Stain   Final    ABUNDANT WBC PRESENT, PREDOMINANTLY PMN NO ORGANISMS SEEN    Culture   Final    RARE PASTEURELLA MULTOCIDA Usually susceptible to penicillin and other beta lactam agents,quinolones,macrolides and tetracyclines. Performed at [facility], [place] [address] [place], [place] [address]    Report Status 11/28/2021 FINAL  Final  Resp Panel by RT-PCR (Flu A&B, Covid) Nasopharyngeal Swab     Status: None   Collection Time: 11/26/21  8:26 PM   Specimen: Nasopharyngeal Swab; Nasopharyngeal(NP) swabs in vial transport medium  Result Value Ref Range Status   SARS Coronavirus 2 by RT PCR NEGATIVE NEGATIVE Final    Comment: (NOTE) SARS-CoV-2 target nucleic acids are NOT DETECTED.  The SARS-CoV-2 RNA is generally detectable in upper respiratory specimens during the acute phase of infection. The lowest concentration of SARS-CoV-2 viral copies this assay can detect is 138 copies/mL. A negative result does not preclude SARS-Cov-2 infection and should not be used as the sole basis for treatment or other patient management decisions. A negative result may occur with  improper specimen collection/handling, submission of specimen other than nasopharyngeal swab, presence of viral mutation(s) within the areas targeted by this assay, and inadequate number of viral copies(<138 copies/mL). A negative result must be combined with clinical observations, patient history, and epidemiological information. The expected result is Negative.  Fact Sheet for Patients:  EntrepreneurPulse.com.au  Fact Sheet for Healthcare Providers:  IncredibleEmployment.be  This test is no t yet approved or cleared by the [place] FDA and  has been  authorized for detection and/or diagnosis of SARS-CoV-2 by FDA under an Emergency Use Authorization (EUA). This EUA will remain  in effect (meaning this test can be used) for the duration of the COVID-19 declaration under Section 564(b)(1) of the Act, 21 U.S.C.section 360bbb-3(b)(1), unless the authorization is terminated  or revoked sooner.       Influenza A by PCR NEGATIVE NEGATIVE Final   Influenza B by PCR NEGATIVE NEGATIVE Final    Comment: (NOTE) The Xpert Xpress SARS-CoV-2/FLU/RSV plus assay is intended as an aid in the diagnosis of influenza from Nasopharyngeal swab specimens and should not be used as a sole basis for treatment. Nasal washings and aspirates are unacceptable for Xpert Xpress SARS-CoV-2/FLU/RSV testing.  Fact Sheet for Patients: EntrepreneurPulse.com.au  Fact Sheet for Healthcare Providers: IncredibleEmployment.be  This test is not yet approved or cleared by the [place] FDA and has been authorized for detection and/or diagnosis of SARS-CoV-2 by FDA under an Emergency Use Authorization (EUA). This EUA will remain in effect (meaning this test can be used) for the duration of the COVID-19 declaration under Section 564(b)(1) of the Act, 21 U.S.C. section 360bbb-3(b)(1), unless the authorization is terminated or revoked.  Performed at [facility], [phone] [place], [place] [address]   Culture, blood (routine x 2)     Status: None (Preliminary result)   Collection Time: 11/30/21  1:32 AM   Specimen: BLOOD  Result Value Ref Range Status   Specimen Description BLOOD BLOOD LEFT FOREARM  Final   Special Requests   Final    BOTTLES DRAWN AEROBIC AND ANAEROBIC Blood Culture adequate volume   Culture   Final    NO GROWTH 4 DAYS Performed at [facility], [address] [place], [place] [address]    Report Status PENDING  Incomplete  Resp Panel by RT-PCR (Flu A&B, Covid) Nasopharyngeal Swab      Status: None   Collection Time: 11/30/21  3:11 AM   Specimen: Nasopharyngeal Swab; Nasopharyngeal(NP) swabs in vial transport medium  Result Value Ref Range Status   SARS Coronavirus 2 by RT PCR NEGATIVE NEGATIVE Final    Comment: (NOTE) SARS-CoV-2 target nucleic acids are NOT DETECTED.  The SARS-CoV-2 RNA is generally detectable in upper respiratory specimens during the acute phase of infection. The lowest concentration of SARS-CoV-2 viral copies this assay can detect is 138 copies/mL. A negative result does not preclude SARS-Cov-2 infection and should not be used as the sole basis for treatment or other patient management decisions. A negative result may occur with  improper specimen collection/handling, submission of specimen other than nasopharyngeal swab, presence of viral mutation(s) within the areas targeted by this assay, and inadequate number of viral copies(<138 copies/mL). A negative result must be combined with clinical observations, patient history, and epidemiological information. The expected result is Negative.  Fact Sheet for Patients:  EntrepreneurPulse.com.au  Fact Sheet for Healthcare Providers:  IncredibleEmployment.be  This test is no t yet approved or cleared by the [place] FDA and  has been authorized for detection and/or diagnosis of SARS-CoV-2 by FDA under an Emergency Use Authorization (EUA). This EUA will remain  in effect (meaning this test can be used) for the duration of the COVID-19 declaration under Section 564(b)(1) of the Act, 21 U.S.C.section 360bbb-3(b)(1), unless the authorization is terminated  or revoked sooner.       Influenza A by PCR NEGATIVE NEGATIVE Final   Influenza B by PCR NEGATIVE NEGATIVE Final    Comment: (NOTE) The Xpert Xpress SARS-CoV-2/FLU/RSV plus assay is intended as an aid in the diagnosis of influenza from Nasopharyngeal swab specimens and should not be used as a sole basis  for treatment. Nasal washings and aspirates are unacceptable for Xpert Xpress SARS-CoV-2/FLU/RSV testing.  Fact Sheet for Patients: EntrepreneurPulse.com.au  Fact Sheet for Healthcare Providers: IncredibleEmployment.be  This test is not yet approved or cleared by the [place] FDA and has been authorized for detection and/or diagnosis of SARS-CoV-2 by FDA under an Emergency Use Authorization (EUA). This EUA will remain in effect (meaning this test can be used) for the duration of the COVID-19 declaration under Section 564(b)(1) of the Act, 21 U.S.C. section 360bbb-3(b)(1), unless the authorization is terminated or revoked.  Performed at [facility], [address] [place],  [address]   Culture, blood (routine x 2)     Status: None (Preliminary result)   Collection Time: 11/30/21  6:35 AM   Specimen: BLOOD LEFT HAND  Result Value Ref Range Status   Specimen Description BLOOD LEFT HAND  Final   Special Requests   Final    BOTTLES DRAWN AEROBIC AND ANAEROBIC Blood Culture adequate volume   Culture   Final    NO GROWTH  4 DAYS Performed at [facility], [address] [place],  [address]    Report Status PENDING  Incomplete     Time coordinating discharge: Over 30 minutes  SIGNED:   Wyvonnia Dusky, MD  [facility] 12/04/2021, 3:12 PM Pager   If 7PM-7AM, please contact night-coverage

## 2021-12-04 NOTE — Clinical Social Work Note (Signed)
Occupational Therapy * Physical Therapy * Speech Therapy          DATE ____12/29/22_______________ PATIENT NAME___[name]_____ PATIENT MRN_[id number]______________  DIAGNOSIS/DIAGNOSIS CODE _Cellulitis of Right Wrist/ L03.113____  DATE OF DISCHARGE: __12/31/22____________  PRIMARY CARE PHYSICIAN _Spencer Coplan__ PCP PHONE/FAX__[phone]____________     Dear Provider (Name: __________________   Fax: ___________________________):   I certify that I have examined this patient and that occupational/physical/speech therapy is necessary on an outpatient basis.    The patient has expressed interest in completing their recommended course of therapy at your location.  Once a formal order from the patient's primary care physician has been obtained, please contact him/her to schedule an appointment for evaluation at your earliest convenience.   [ X]  Physical Therapy Evaluate and Treat [ X ]  Occupational Therapy Evaluate and Treat [  ]  Speech Therapy Evaluate and Treat       The patient's primary care physician (listed above) must furnish and be responsible for a formal order such that the recommended services may be furnished while under the primary physician's care, and that the plan of care will be established and reviewed every 30 days (or more often if condition necessitates).   MD electronic signature noted below

## 2021-12-04 NOTE — Progress Notes (Signed)
Occupational Therapy Treatment Patient Details Name: Amanda [name] MRN: [id number] DOB: 26-Dec-1950 Today's Date: 12/04/2021   History of present illness 70 y.o. with a history of HTN, aortic stenosis, non obstructive CAD, COPD, Diet controlled DM, was recently hospitalized between 12/21-12/24 for rt hand/wrist pain and swelling from a cat bite . There was an abscess on the wrist area and it was I/D in the ED. That grew pasteurella  She had pasteurella bacteremia and after being on IV zosyn was discharged on PO augmentin.   She returned to the ED on 12/25 with worsening swelling and pain and redness of the rt wrist area and dorsum hand and restricted movt of the wrist   OT comments  Chart reviewed, RN cleared pt for OT tx session. Pt greeted at edge of bed, agreeable to OT tx session. Pt wearing personal socks, refused to wear hospital grip socks as per policy and provided education on recommendation for mobility. Pt continued to refuse. RN notified. Pt performed HEP provided yesterday with use of hand out with intermittent vcs required. Educated on compensatory techniques and positioning strategies for performance and pain relief. Pt in agreement with all questions answered. Pt is left as reiceved, NAD, allneeds met. OT will continue to follow.    Recommendations for follow up therapy are one component of a multi-disciplinary discharge planning process, led by the attending physician.  Recommendations may be updated based on patient status, additional functional criteria and insurance authorization.    Follow Up Recommendations  Follow physician's recommendations for discharge plan and follow up therapies    Assistance Recommended at Discharge Set up Supervision/Assistance  Equipment Recommendations  None recommended by OT    Recommendations for Other Services      Precautions / Restrictions Precautions Precautions: Fall        ADL either performed or assessed with clinical judgement    ADL Overall ADL's : Needs assistance/impaired                                       General ADL Comments: set up for feeding at bedside; sup for functonal mobility; compensatory techniques for R hand pain/edema    Extremity/Trunk Assessment Upper Extremity Assessment Upper Extremity Assessment: RUE deficits/detail RUE Deficits / Details: opposition of thumb/index, middle/index, ring/index; able to perform on this date unable to perform pinky/thumb; pronation/supination 3/4 full AROM, significant pain; unable form fist,can open fully            Vision Patient Visual Report: No change from baseline     Perception     Praxis      Cognition Arousal/Alertness: Awake/alert Behavior During Therapy: WFL for tasks assessed/performed Overall Cognitive Status: Within Functional Limits for tasks assessed                                            Exercises Other Exercises Other Exercises: R hand finger opposition 10x 2 sets; pronation supination 10x 2 sets; hand open/close 10x 2 sets with good accuracy and performance           Pertinent Vitals/ Pain       Pain Assessment: 0-10 Pain Score: 5    Frequency  Min 2X/week        Progress Toward Goals  OT Goals(current goals can now  be found in the care plan section)  Progress towards OT goals: Progressing toward goals  Acute Rehab OT Goals Patient Stated Goal: to go home OT Goal Formulation: With patient Time For Goal Achievement: 12/18/21 Potential to Achieve Goals: [place] Discharge plan remains appropriate;Frequency remains appropriate    Co-evaluation                 AM-PAC OT "6 Clicks" Daily Activity     Outcome Measure   Help from another person eating meals?: A Little Help from another person taking care of personal grooming?: A Little Help from another person toileting, which includes using toliet, bedpan, or urinal?: None Help from another person bathing  (including washing, rinsing, drying)?: A Little Help from another person to put on and taking off regular upper body clothing?: A Little Help from another person to put on and taking off regular lower body clothing?: A Little 6 Click Score: 19    End of Session        Activity Tolerance Patient tolerated treatment well   Patient Left with call bell/phone within reach (edge of bed eating her meal)   Nurse Communication Mobility status        Time: 5806-3868 OT Time Calculation (min): 18 min  Charges: OT General Charges $OT Visit: 1 Visit OT Treatments $Therapeutic Exercise: 8-22 mins  {Cosette Prindle, OTD OTR/L  12/04/21, 2:01 PM

## 2021-12-05 ENCOUNTER — Other Ambulatory Visit: Payer: Medicare Other

## 2021-12-05 LAB — CBC
HCT: 37.7 % (ref 36.0–46.0)
Hemoglobin: 12.2 g/dL (ref 12.0–15.0)
MCH: 33.4 pg (ref 26.0–34.0)
MCHC: 32.4 g/dL (ref 30.0–36.0)
MCV: 103.3 fL — ABNORMAL HIGH (ref 80.0–100.0)
Platelets: 349 10*3/uL (ref 150–400)
RBC: 3.65 MIL/uL — ABNORMAL LOW (ref 3.87–5.11)
RDW: 10.9 % — ABNORMAL LOW (ref 11.5–15.5)
WBC: 8.5 10*3/uL (ref 4.0–10.5)
nRBC: 0 % (ref 0.0–0.2)

## 2021-12-05 LAB — CULTURE, BLOOD (ROUTINE X 2)
Culture: NO GROWTH
Culture: NO GROWTH
Special Requests: ADEQUATE
Special Requests: ADEQUATE

## 2021-12-05 LAB — BASIC METABOLIC PANEL
Anion gap: 8 (ref 5–15)
BUN: 7 mg/dL — ABNORMAL LOW (ref 8–23)
CO2: 26 mmol/L (ref 22–32)
Calcium: 9 mg/dL (ref 8.9–10.3)
Chloride: 105 mmol/L (ref 98–111)
Creatinine, Ser: 0.72 mg/dL (ref 0.44–1.00)
GFR, Estimated: >60 mL/min (ref >60)
Glucose, Bld: 132 mg/dL — ABNORMAL HIGH (ref 70–99)
Potassium: 4.1 mmol/L (ref 3.5–5.1)
Sodium: 139 mmol/L (ref 135–145)

## 2021-12-05 LAB — GLUCOSE, CAPILLARY: Glucose-Capillary: 144 mg/dL — ABNORMAL HIGH (ref 70–99)

## 2021-12-05 NOTE — Progress Notes (Signed)
Pharmacy Antibiotic Note  Amanda [name] is a 70 y.o. female admitted on 11/30/2021 with Cellulitis from a cat bite.  Wound culture had previously grown bacteremia and pasteurella wound. Pharmacy has been consulted for Unasyn dosing.  Plan: Continue Unasyn 3 gm IV Q6H Pt to be discharged on PO Augmentin for 2 more weeks per ID recommendations  Height: 5\' 8"  (172.7 cm) Weight: 106.6 kg (235 lb) IBW/kg (Calculated) : 63.9  Temp (24hrs), Avg:98.7 F (37.1 C), Min:98.4 F (36.9 C), Max:99.1 F (37.3 C)  Recent Labs  Lab 11/30/21 0132 12/01/21 0732 12/03/21 1003 12/04/21 0510 12/05/21 0551  WBC 8.1 5.7 7.2 7.2 8.5  CREATININE 0.70 0.74 0.78 0.64 0.72  LATICACIDVEN 1.1  --   --   --   --      Estimated Creatinine Clearance: 82.5 mL/min (by C-G formula based on SCr of 0.72 mg/dL).    Allergies  Allergen Reactions   Honey Bee Venom [Bee Venom] Anaphylaxis   Diazepam Other (See Comments)    REACTION: pt states she gets very angry and abusive verbally on this medication      Microbiology results: 12/25 BCx: NGTD   Thank you for allowing pharmacy to be a part of this patients care.  Forde Dandy Analaura Messler 12/05/2021 9:34 AM

## 2021-12-10 ENCOUNTER — Encounter: Payer: Medicare Other | Admitting: Family Medicine

## 2021-12-11 ENCOUNTER — Other Ambulatory Visit: Payer: Self-pay | Admitting: Family Medicine

## 2021-12-12 NOTE — Telephone Encounter (Signed)
Last office visit 6 week follow up.  Last refilled 05/27/21 for #30 with 5 refills.  CPE scheduled 12/17/21.

## 2021-12-16 ENCOUNTER — Other Ambulatory Visit: Payer: Self-pay

## 2021-12-16 ENCOUNTER — Ambulatory Visit: Payer: Medicare Other | Attending: Infectious Diseases | Admitting: Infectious Diseases

## 2021-12-16 ENCOUNTER — Encounter: Payer: Self-pay | Admitting: Infectious Diseases

## 2021-12-16 ENCOUNTER — Telehealth: Payer: Self-pay | Admitting: Cardiovascular Disease

## 2021-12-16 VITALS — BP 110/68 | HR 86 | Temp 98.7°F | Resp 16 | Ht 68.0 in | Wt 235.0 lb

## 2021-12-16 DIAGNOSIS — W5501XA Bitten by cat, initial encounter: Secondary | ICD-10-CM | POA: Diagnosis not present

## 2021-12-16 DIAGNOSIS — I2510 Atherosclerotic heart disease of native coronary artery without angina pectoris: Secondary | ICD-10-CM | POA: Diagnosis not present

## 2021-12-16 DIAGNOSIS — S61551A Open bite of right wrist, initial encounter: Secondary | ICD-10-CM | POA: Diagnosis not present

## 2021-12-16 DIAGNOSIS — I350 Nonrheumatic aortic (valve) stenosis: Secondary | ICD-10-CM | POA: Insufficient documentation

## 2021-12-16 DIAGNOSIS — E119 Type 2 diabetes mellitus without complications: Secondary | ICD-10-CM | POA: Insufficient documentation

## 2021-12-16 DIAGNOSIS — J449 Chronic obstructive pulmonary disease, unspecified: Secondary | ICD-10-CM | POA: Insufficient documentation

## 2021-12-16 DIAGNOSIS — W5501XD Bitten by cat, subsequent encounter: Secondary | ICD-10-CM

## 2021-12-16 DIAGNOSIS — I10 Essential (primary) hypertension: Secondary | ICD-10-CM | POA: Insufficient documentation

## 2021-12-16 DIAGNOSIS — A280 Pasteurellosis: Secondary | ICD-10-CM | POA: Insufficient documentation

## 2021-12-16 DIAGNOSIS — L03113 Cellulitis of right upper limb: Secondary | ICD-10-CM | POA: Diagnosis not present

## 2021-12-16 DIAGNOSIS — L02413 Cutaneous abscess of right upper limb: Secondary | ICD-10-CM | POA: Diagnosis present

## 2021-12-16 MED ORDER — AMOXICILLIN-POT CLAVULANATE 875-125 MG PO TABS: 1.0000 | ORAL_TABLET | Freq: Two times a day (BID) | ORAL | 0 refills | Status: AC

## 2021-12-16 NOTE — Patient Instructions (Signed)
You are ehre for follow up cat bite injury to the rt wrise, had abscess which was drained, currently minimal swelling and a small wound- will continue augmentin 875mg Po BID for 7 more days.

## 2021-12-16 NOTE — Telephone Encounter (Signed)
Patient states she had an ECHO in the hospital  Would like to know if this can be used for Dr Fletcher Anon or if she should have another  Please advise

## 2021-12-16 NOTE — Progress Notes (Signed)
NAME: Amanda [name]  DOB: 11-06-1951  MRN: [id number]  Date/Time: 12/16/2021 9:19 AM   Subjective:   Follow up after recent cat bite injury leading to 11/30/21-12/30 Amanda [name] is a 71 y.o. female with a history of HTN, aortic stenosis, non obstructive CAD, COPD, Diet controlled DM, was recently hospitalized between 12/21-12/24 for rt hand/wrist pain and swelling from a cat bite . There was an abscess on the wrist area and it was I/D in the ED. That grew pasteurella She had pasteurella bacteremia and after being on IV zosyn was discharged on PO augmentin.  She returned to the ED on 12/25 with worsening swelling and pain and redness of the rt wrist area and dorsum hand and restricted movt of the wrist. She was taken to the OR on 12/27 and the abscess was debrided- she was on Iv unasyn and then sent home on PO augmentin for 2 weeks She is doing much better Swelling and redness much improved Still has some pain and restricted movt of her fingers- saw Dr.Menz last Friday and he ahs referred her to PT She is here with her daughter ttoday    Past Medical History:  Diagnosis Date   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 2013: nl LV sys fxn, mild MR, no evidence of pulm htn; b. TTE 8/17: EF 60-65%, no RWMA, nl LV dia fxn,  mild AS, mod AI   CAD (coronary artery disease), native coronary artery 11/26/2020   Controlled type 2 diabetes mellitus with diabetic autonomic neuropathy, without long-term current use of insulin ([place]) 08/10/2007   Qualifier: Diagnosis of  By: Council Mechanic MD, Hilaria Ota    COPD (chronic obstructive pulmonary disease) ([place]) 10/30/2016   Depression    GAD (generalized anxiety disorder)    GERD (gastroesophageal reflux disease)    Hiatal hernia with gastroesophageal reflux 1997   Hyperlipidemia    Hypertension    Osteoporosis    Tobacco abuse     Past Surgical History:  Procedure Laterality Date   ABDOMINAL HYSTERECTOMY  1993   ANTERIOR  CERVICAL DECOMP/DISCECTOMY FUSION N/A 10/26/2018   Procedure: ACDF C4-C5 C5-C6 C6-C7;  Surgeon: Kary Kos, MD;  Location: [facility];  Service: Neurosurgery;  Laterality: N/A;   APPENDECTOMY     BACK SURGERY     BREAST BIOPSY Right 2007   benign   CHOLECYSTECTOMY     COLONOSCOPY     30 years ago was normal per pt.    DILATION AND CURETTAGE OF UTERUS     ESOPHAGEAL DILATION     x 3   fractured leg Right    INCISION AND DRAINAGE Right 12/02/2021   Procedure: INCISION AND DRAINAGE;  Surgeon: Hessie Knows, MD;  Location: [facility];  Service: Orthopedics;  Laterality: Right;   LEFT HEART CATH AND CORONARY ANGIOGRAPHY N/A 11/11/2020   Procedure: LEFT HEART CATH AND CORONARY ANGIOGRAPHY;  Surgeon: Wellington Hampshire, MD;  Location: [facility];  Service: Cardiovascular;  Laterality: N/A;   OOPHORECTOMY     OVARIAN CYST REMOVAL  1972   SALIVARY GLAND SURGERY     TONSILLECTOMY     4 yoa   TOTAL KNEE ARTHROPLASTY Left 12/24/2015   Procedure: LEFT TOTAL KNEE ARTHROPLASTY;  Surgeon: [name], MD;  Location: [facility];  Service: Orthopedics;  Laterality: Left;   TOTAL KNEE ARTHROPLASTY Right 12/30/2017   Procedure: RIGHT TOTAL KNEE ARTHROPLASTY;  Surgeon: [name], MD;  Location: [facility];  Service: Orthopedics;  Laterality: Right;   WRIST FRACTURE SURGERY  11/2008    Social History   Socioeconomic History   Marital status: Married    Spouse name: Not on file   Number of children: 2   Years of education: Not on file   Highest education level: Not on file  Occupational History   Occupation: [employer]    Employer:  [employer]  Tobacco Use   Smoking status: Every Day    Packs/day: 0.75    Years: 45.00    Pack years: 33.75    Types: Cigarettes   Smokeless tobacco: Never   Tobacco comments:    Smoking half PPD but is trying to quit.   Vaping Use   Vaping Use: Never used  Substance and Sexual Activity   Alcohol use: Yes     Alcohol/week: 0.0 standard drinks    Comment: glass of wine twice a month    Drug use: No   Sexual activity: Not Currently  Other Topics Concern   Not on file  Social History Narrative   Not on file   Social Determinants of Health   Financial Resource Strain: Not on file  Food Insecurity: Not on file  Transportation Needs: Not on file  Physical Activity: Not on file  Stress: Not on file  Social Connections: Not on file  Intimate Partner Violence: Not on file    Family History  Problem Relation Age of Onset   Hyperlipidemia Mother    Arthritis Mother    Diabetes Mother    Heart disease Mother    Colon polyps Mother    Cancer Paternal Grandmother        breast   Hypercholesterolemia Sister    Colon polyps Sister    Breast cancer Sister    Colon cancer Neg Hx    Esophageal cancer Neg Hx    Rectal cancer Neg Hx    Stomach cancer Neg Hx    Pancreatic cancer Neg Hx    Allergies  Allergen Reactions   Honey Bee Venom [Bee Venom] Anaphylaxis   Diazepam Other (See Comments)    REACTION: pt states she gets very angry and abusive verbally on this medication    I? Current Outpatient Medications  Medication Sig Dispense Refill   acetaminophen (TYLENOL) 650 MG CR tablet Take 650 mg by mouth 3 times/day as needed-between meals & bedtime (for headaches).     albuterol (PROVENTIL HFA;VENTOLIN HFA) 108 (90 Base) MCG/ACT inhaler Inhale 2 puffs into the lungs every 4 (four) hours as needed for wheezing or shortness of breath. 1 Inhaler 5   amitriptyline (ELAVIL) 50 MG tablet TAKE 2 TABLETS BY MOUTH DAILY AT BEDTIME. 180 tablet 1   amoxicillin-clavulanate (AUGMENTIN) 875-125 MG tablet Take 1 tablet by mouth 2 (two) times daily for 14 days. 28 tablet 0   Ascorbic Acid (VITAMIN C) 1000 MG tablet Take 1,000 mg by mouth daily.     aspirin EC 81 MG tablet Take 81 mg by mouth daily.     buPROPion (WELLBUTRIN XL) 150 MG 24 hr tablet TAKE 1 TABLET (150 MG TOTAL) BY MOUTH DAILY. 90 tablet 1    calcium carbonate (TUMS - DOSED IN MG ELEMENTAL CALCIUM) 500 MG chewable tablet Chew 2 tablets by mouth daily as needed for indigestion or heartburn.     Cholecalciferol (VITAMIN D) 1000 UNITS capsule Take 1,000 Units by mouth daily.      clonazePAM (KLONOPIN) 0.5 MG tablet Take  1 tablet by mouth twice daily as needed 30 tablet 5   cyclobenzaprine (FLEXERIL) 10 MG tablet TAKE 1 TABLET THREE TIMES DAILY AS NEEDED FOR MUSCLE SPAMS 50 tablet 3   ezetimibe (ZETIA) 10 MG tablet Take 1 tablet (10 mg total) by mouth daily. 90 tablet 3   FLUoxetine (PROZAC) 40 MG capsule TAKE 1 CAPSULE (40 MG TOTAL) BY MOUTH DAILY. 90 capsule 1   gabapentin (NEURONTIN) 100 MG capsule Take 1 capsule (100 mg total) by mouth at bedtime. 30 capsule 3   guaiFENesin (MUCINEX) 600 MG 12 hr tablet Take 600 mg by mouth daily.     metoprolol tartrate (LOPRESSOR) 25 MG tablet TAKE 1/2 TABLET TWICE DAILY 90 tablet 3   Multiple Vitamin (MULTIVITAMIN) tablet Take 1 tablet by mouth daily.     naproxen sodium (ALEVE) 220 MG tablet Take 220 mg by mouth daily.     pantoprazole (PROTONIX) 40 MG tablet TAKE 1 TABLET 30 MINUTES PRIOR TO BREAKFAST 90 tablet 3   polycarbophil (FIBERCON) 625 MG tablet Take 625 mg by mouth daily as needed. FIBER THERAPY     rosuvastatin (CRESTOR) 40 MG tablet TAKE 1 TABLET EVERY DAY 90 tablet 3   Simethicone 125 MG CAPS Take 125 mg by mouth daily as needed (gas).     vitamin E 180 MG (400 UNITS) capsule Take 400 Units by mouth daily.     No current facility-administered medications for this visit.     Abtx:  Anti-infectives (From admission, onward)    None       REVIEW OF SYSTEMS:  Const: negative fever, negative chills, negative weight loss Eyes: negative diplopia or visual changes, negative eye pain ENT: negative coryza, negative sore throat Resp: negative cough, hemoptysis, dyspnea Cards: negative for chest pain, palpitations, lower extremity edema GU: negative for frequency, dysuria and  hematuria GI: Negative for abdominal pain, diarrhea, bleeding, constipation Skin: negative for rash and pruritus Heme: negative for easy bruising and gum/nose bleeding MS: pain and mild swelling with some restriction in finger flexion rt hand Neurolo:negative for headaches, dizziness, vertigo, memory problems  Psych: anxiety, depression  Endocrine: diet controlled diabetes Allergy/Immunology-  as above Objective:  VITALS:  BP 110/68    Pulse 86    Temp 98.7 F (37.1 C) (Oral)    Resp 16    Ht 5\' 8"  (1.727 m)    Wt 235 lb (106.6 kg)    SpO2 100%    BMI 35.73 kg/m  PHYSICAL EXAM:  General: Alert, cooperative, no distress, appears stated age.  Head: Normocephalic, without obvious abnormality, atraumatic. Eyes: Conjunctivae clear, anicteric sclerae. Pupils are equal ENT Nares normal. No drainage or sinus tenderness. Lips, mucosa, and tongue normal. No Thrush Neck: Supple, symmetrical, no adenopathy, thyroid: non tender no carotid bruit and no JVD. Back: No CVA tenderness. Lungs: Clear to auscultation bilaterally. No Wheezing or Rhonchi. No rales. Heart: Regular rate and rhythm, no murmur, rub or gallop. Abdomen: Soft, non-tender,not distended. Bowel sounds normal. No masses Extremities:           atraumatic, no cyanosis. No edema. No clubbing Skin: No rashes or lesions. Or bruising Lymph: Cervical, supraclavicular normal. Neurologic: Grossly non-focal Pertinent Labs Lab Results CBC    Component Value Date/Time   WBC 8.5 12/05/2021 0551   RBC 3.65 (L) 12/05/2021 0551   HGB 12.2 12/05/2021 0551   HCT 37.7 12/05/2021 0551   PLT 349 12/05/2021 0551   MCV 103.3 (H) 12/05/2021 0551   MCH 33.4 12/05/2021  0551   MCHC 32.4 12/05/2021 0551   RDW 10.9 (L) 12/05/2021 0551   LYMPHSABS 1.2 11/30/2021 0132   MONOABS 1.0 11/30/2021 0132   EOSABS 0.1 11/30/2021 0132   BASOSABS 0.0 11/30/2021 0132    CMP Latest Ref Rng & Units 12/05/2021 12/04/2021 12/03/2021  Glucose 70 - 99  mg/dL 132(H) 139(H) 185(H)  BUN 8 - 23 mg/dL 7(L) 6(L) 6(L)  Creatinine 0.44 - 1.00 mg/dL 0.72 0.64 0.78  Sodium 135 - 145 mmol/L 139 136 135  Potassium 3.5 - 5.1 mmol/L 4.1 3.3(L) 3.6  Chloride 98 - 111 mmol/L 105 104 105  CO2 22 - 32 mmol/L 26 23 24   Calcium 8.9 - 10.3 mg/dL 9.0 8.5(L) 8.7(L)  Total Protein 6.5 - 8.1 g/dL - - -  Total Bilirubin 0.3 - 1.2 mg/dL - - -  Alkaline Phos 38 - 126 U/L - - -  AST 15 - 41 U/L - - -  ALT 0 - 44 U/L - - -    ? Impression/Recommendation ? ?Cat bite followed by abscess rt wrist area-? Tenosynovitis  S/p I/D Was On unasyn now on augmentin 875mg  PO BID x2 weeks- will complete tomorrow- will extend for another week Doing much better   Pasteurella bacteremia and purulent cellulitis- treatment as above   Aortic stenosis- 2 d echo no vegetation-only calcified valve    ? ___________________________________________________ Discussed with patient, and her daughter Follow PRN

## 2021-12-16 NOTE — Telephone Encounter (Signed)
Patient had an echo in the hospital on 12/02/21. Ok to cancel the scheduled echo. Msg also fwd to Dr. Fletcher Anon to review the echo prior to the patients 01/01/22 appt with him

## 2021-12-16 NOTE — Telephone Encounter (Signed)
Yes, you can cancel the echocardiogram.

## 2021-12-17 ENCOUNTER — Encounter: Payer: Self-pay | Admitting: Family Medicine

## 2021-12-17 ENCOUNTER — Ambulatory Visit (INDEPENDENT_AMBULATORY_CARE_PROVIDER_SITE_OTHER): Payer: Medicare Other | Admitting: Family Medicine

## 2021-12-17 VITALS — BP 120/70 | HR 78 | Temp 97.3°F | Ht 64.5 in | Wt 218.0 lb

## 2021-12-17 DIAGNOSIS — L02419 Cutaneous abscess of limb, unspecified: Secondary | ICD-10-CM

## 2021-12-17 DIAGNOSIS — A280 Pasteurellosis: Secondary | ICD-10-CM

## 2021-12-17 DIAGNOSIS — E1143 Type 2 diabetes mellitus with diabetic autonomic (poly)neuropathy: Secondary | ICD-10-CM | POA: Diagnosis not present

## 2021-12-17 DIAGNOSIS — F3341 Major depressive disorder, recurrent, in partial remission: Secondary | ICD-10-CM | POA: Diagnosis not present

## 2021-12-17 DIAGNOSIS — Z0000 Encounter for general adult medical examination without abnormal findings: Secondary | ICD-10-CM | POA: Diagnosis not present

## 2021-12-17 DIAGNOSIS — E7800 Pure hypercholesterolemia, unspecified: Secondary | ICD-10-CM

## 2021-12-17 DIAGNOSIS — R413 Other amnesia: Secondary | ICD-10-CM | POA: Diagnosis not present

## 2021-12-17 DIAGNOSIS — R1310 Dysphagia, unspecified: Secondary | ICD-10-CM | POA: Diagnosis not present

## 2021-12-17 DIAGNOSIS — Z122 Encounter for screening for malignant neoplasm of respiratory organs: Secondary | ICD-10-CM | POA: Diagnosis not present

## 2021-12-17 DIAGNOSIS — F17200 Nicotine dependence, unspecified, uncomplicated: Secondary | ICD-10-CM | POA: Diagnosis not present

## 2021-12-17 MED ORDER — CYCLOBENZAPRINE HCL 10 MG PO TABS: ORAL_TABLET | ORAL | 3 refills | Status: DC

## 2021-12-17 MED ORDER — FLUOXETINE HCL 40 MG PO CAPS: 80.0000 mg | ORAL_CAPSULE | Freq: Every day | ORAL | 3 refills | Status: DC

## 2021-12-17 NOTE — Patient Instructions (Signed)
Covid booster in the spring  Check with your insurance to see if they will cover the shingles shot.  The newer [facility] shot is much better than the older shot. The [facility] shot requires 2 shots given 6 months apart.

## 2021-12-17 NOTE — Progress Notes (Signed)
Amanda Lennartz T. Jabes Primo, MD, [facility] [place], [address]  Phone: [phone]   FAX: [phone]  Amanda [name] - 71 y.o. female   MRN [id number]   Date of Birth: Feb 24, 1951  Date: 12/17/2021   PCP: Owens Loffler, MD   Referral: Owens Loffler, MD  Chief Complaint  Patient presents with   Medicare Wellness    This visit occurred during the SARS-CoV-2 public health emergency.  Safety protocols were in place, including screening questions prior to the visit, additional usage of staff PPE, and extensive cleaning of exam room while observing appropriate contact time as indicated for disinfecting solutions.   Patient Care Team: Owens Loffler, MD as PCP - General (Family Medicine) Debbora Dus, [facility] as Pharmacist (Pharmacist) Subjective:   Amanda [name] is a 71 y.o. pleasant patient who presents for a medicare wellness examination:  Health Maintenance Summary Reviewed and updated, unless pt declines services.  Tobacco History Reviewed. 1/2 pack a day. Alcohol: No concerns, no excessive use Exercise Habits: None.   STD concerns: none Drug Use: None Birth control method: n/a Menses regular: n/a Lumps or breast concerns: no Breast Cancer Family History: no  Recent admit with severe infection.  Wrist. She is being followed by orthopedic surgery and infectious disease. R wrist still hurts.   Eye MD Trouble with balance Knot on the back of her head.  CT [facility] FOR LUNG CANCER  Shingrix Covid vaccine in the spring  Also feeling down and depressed.  This is been a chronic issue for her.  She does feel down and depressed, she also has some anhedonia. Her interest and energy is down, and she is also not sleeping as well.  Her family is also worried about memory difficulties.  Folstein below  She is also having a sensation that she is having food get stuck while she is swallowing.  Health  Maintenance  Topic Date Due   Zoster Vaccines- Shingrix (1 of 2) Never done   FOOT EXAM  12/19/2011   DEXA SCAN  Never done   COVID-19 Vaccine (3 - Mixed Product risk series) 02/29/2020   OPHTHALMOLOGY EXAM  07/30/2020   INFLUENZA VACCINE  07/07/2021   MAMMOGRAM  08/31/2021   HEMOGLOBIN A1C  05/27/2022   URINE MICROALBUMIN  06/23/2022   COLONOSCOPY (Pts 45-10yr Insurance coverage will need to be confirmed)  11/19/2025   TETANUS/TDAP  09/14/2027   Pneumonia Vaccine 71 Years old  Completed   Hepatitis C Screening  Completed   HPV VACCINES  Aged Out   Immunization History  Administered Date(s) Administered   Fluad Quad(high Dose 65+) 08/15/2020   Influenza Split 09/09/2011, 10/10/2012   Influenza Whole 09/20/2006   Influenza, High Dose Seasonal PF 08/15/2019   Influenza,inj,Quad PF,6+ Mos 09/18/2015, 09/16/2016, 09/13/2017   PFIZER(Purple Top)SARS-COV-2 Vaccination 01/25/2020, 02/01/2020   PPD Test 12/27/2015   Pneumococcal Conjugate-13 09/18/2015   Pneumococcal Polysaccharide-23 11/23/2016   Td 12/26/1998, 09/13/2017    Patient Active Problem List   Diagnosis Date Noted   CAD (coronary artery disease), native coronary artery 11/26/2020    Priority: High   COPD with chronic bronchitis ([place] 10/30/2016    Priority: High   Controlled type 2 diabetes mellitus with diabetic autonomic neuropathy, without long-term current use of insulin ([place] 08/10/2007    Priority: High   Nonrheumatic aortic valve stenosis     Priority: Medium    Smoker 05/10/2015    Priority: Medium  Essential hypertension     Priority: Medium    Pure hypercholesterolemia 12/07/2007    Priority: Medium    Major depressive disorder, recurrent episode, in partial remission ([place]) 12/07/2007    Priority: Medium    Depression    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 2013: nl LV sys fxn, mild MR, no evidence of pulm htn; b. TTE 8/17: EF 60-65%, no RWMA, nl LV dia fxn,  mild AS, mod AI   CAD (coronary artery disease), native coronary artery 11/26/2020   Controlled type 2 diabetes mellitus with diabetic autonomic neuropathy, without long-term current use of insulin ([place]) 08/10/2007   Qualifier: Diagnosis of  By: Council Mechanic MD, Hilaria Ota    COPD (chronic obstructive pulmonary disease) ([place]) 10/30/2016   Depression    GAD (generalized anxiety disorder)    GERD (gastroesophageal reflux disease)    Hiatal hernia with gastroesophageal reflux 1997   Hyperlipidemia    Hypertension    Osteoporosis    Tobacco abuse     Past Surgical History:  Procedure Laterality Date   ABDOMINAL HYSTERECTOMY  1993   ANTERIOR CERVICAL DECOMP/DISCECTOMY FUSION N/A 10/26/2018   Procedure: ACDF C4-C5 C5-C6 C6-C7;  Surgeon: Kary Kos, MD;  Location: [facility];  Service: Neurosurgery;  Laterality: N/A;   APPENDECTOMY     BACK SURGERY     BREAST BIOPSY Right 2007   benign   CHOLECYSTECTOMY     COLONOSCOPY     30 years ago was normal per pt.    DILATION AND CURETTAGE OF UTERUS     ESOPHAGEAL DILATION     x 3   fractured leg Right    INCISION AND DRAINAGE Right 12/02/2021   Procedure: INCISION AND DRAINAGE;  Surgeon: Hessie Knows, MD;  Location: [facility];  Service: Orthopedics;  Laterality: Right;   LEFT HEART CATH AND CORONARY ANGIOGRAPHY N/A 11/11/2020   Procedure: LEFT HEART CATH AND CORONARY ANGIOGRAPHY;  Surgeon: Wellington Hampshire, MD;  Location: [facility];  Service: Cardiovascular;  Laterality: N/A;   OOPHORECTOMY     OVARIAN CYST REMOVAL  1972   SALIVARY GLAND SURGERY     TONSILLECTOMY     4 yoa   TOTAL KNEE ARTHROPLASTY Left 12/24/2015   Procedure: LEFT TOTAL KNEE  ARTHROPLASTY;  Surgeon: [name], MD;  Location: [facility];  Service: Orthopedics;  Laterality: Left;   TOTAL KNEE ARTHROPLASTY Right 12/30/2017   Procedure: RIGHT TOTAL KNEE ARTHROPLASTY;  Surgeon: [name], MD;  Location: [facility];  Service: Orthopedics;  Laterality: Right;   WRIST FRACTURE SURGERY  11/2008    Family History  Problem Relation Age of Onset   Hyperlipidemia Mother    Arthritis Mother    Diabetes Mother    Heart disease Mother    Colon polyps Mother    Cancer Paternal Grandmother        breast   Hypercholesterolemia Sister    Colon polyps Sister    Breast cancer Sister    Colon cancer Neg Hx    Esophageal cancer Neg Hx    Rectal cancer Neg Hx  Stomach cancer Neg Hx    Pancreatic cancer Neg Hx     Past Medical History, Surgical History, Social History, Family History, Problem List, Medications, and Allergies have been reviewed and updated if relevant.  Review of Systems: Pertinent positives are listed above.  Otherwise, a full 14 point review of systems has been done in full and it is negative except where it is noted positive.  Objective:   BP 120/70    Pulse 78    Temp (!) 97.3 F (36.3 C) (Temporal)    Ht 5' 4.5" (1.638 m)    Wt 218 lb (98.9 kg)    SpO2 98%    BMI 36.84 kg/m  Fall Risk 12/04/2021 12/05/2021 12/05/2021 12/16/2021 12/17/2021  Falls in the past year? - - - 1 1  Number of falls in past year - - - - -  Was there an injury with Fall? - - - 1 1  Fall Risk Category Calculator - - - 3 3  Fall Risk Category - - - High High  Patient Fall Risk Level Moderate fall risk Moderate fall risk Moderate fall risk - -   Ideal Body Weight: Weight in (lb) to have BMI = 25: 147.6 Hearing Screening  Method: Audiometry   '500Hz'  '1000Hz'  '2000Hz'  '4000Hz'   Right ear 40 '20 20 20  ' Left ear 0 0 20 20   Vision Screening   Right eye Left eye Both eyes  Without correction '20/40 20/30 20/30 '  With correction      Depression screen [facility] 2/9 12/17/2021  12/16/2021 08/15/2020 05/30/2019 06/02/2017  Decreased Interest 1 0 1 0 0  Down, Depressed, Hopeless 2 0 1 0 0  PHQ - 2 Score 3 0 2 0 0  Altered sleeping 2 - 2 0 -  Tired, decreased energy 3 - 3 0 -  Change in appetite 2 - 1 0 -  Feeling bad or failure about yourself  1 - 1 0 -  Trouble concentrating 3 - 3 0 -  Moving slowly or fidgety/restless 3 - 3 0 -  Suicidal thoughts 0 - 0 0 -  PHQ-9 Score 17 - 15 0 -  Difficult doing work/chores Somewhat difficult - Somewhat difficult Not difficult at all -  Some recent data might be hidden     GEN: well developed, well nourished, no acute distress Eyes: conjunctiva and lids normal, PERRLA, EOMI ENT: TM clear, nares clear, oral exam WNL Neck: supple, no lymphadenopathy, no thyromegaly, no JVD Pulm: clear to auscultation and percussion, respiratory effort normal CV: regular rate and rhythm, S1-S2, no murmur, rub or gallop, no bruits Chest: no scars, masses, no lumps BREAST: breast exam declined GI: soft, non-tender; no hepatosplenomegaly, masses; active bowel sounds all quadrants GU: GU exam declined Lymph: no cervical, axillary or inguinal adenopathy MSK: gait normal, muscle tone and strength WNL, no joint swelling, effusions, discoloration, crepitus  SKIN: clear, good turgor, color WNL, no rashes, lesions, or ulcerations Neuro: normal mental status, normal strength, sensation, and motion Psych: alert; oriented to person, place and time, normally interactive and not anxious or depressed in appearance.  What is the (year) (season) (date) (day) (month) 5 /5  What is the (state) (country) (town) (hospital) (floor) 5/5  Remember [place], [place], [place] - repeat them back to me 3/3  Spell W-O-R-L-D backwards?  5/5  What are the 3 objects I gave you 3/3  Language  Name pen - watch 2/2  Repeat No ifs, ands or buts? 1/1  Take a  paper in your hand, fold it in half and put it on the floor.  3/3  Read and obey the following:  CLOSE YOUR EYES   1/1  Write a sentence 1/1  Copy the design 1/1  Total 30/30     All labs reviewed with patient.  Lipids: Lab Results  Component Value Date   CHOL 129 06/23/2021   Lab Results  Component Value Date   HDL 54.50 06/23/2021   Lab Results  Component Value Date   LDLCALC 60 06/23/2021   Lab Results  Component Value Date   TRIG 74.0 06/23/2021   Lab Results  Component Value Date   CHOLHDL 2 06/23/2021   CBC: CBC Latest Ref Rng & Units 12/05/2021 12/04/2021 12/03/2021  WBC 4.0 - 10.5 K/uL 8.5 7.2 7.2  Hemoglobin 12.0 - 15.0 g/dL 12.2 10.4(L) 10.9(L)  Hematocrit 36.0 - 46.0 % 37.7 32.5(L) 33.1(L)  Platelets 150 - 400 K/uL 349 283 354    Basic Metabolic Panel:    Component Value Date/Time   NA 139 12/05/2021 0551   K 4.1 12/05/2021 0551   CL 105 12/05/2021 0551   CO2 26 12/05/2021 0551   BUN 7 (L) 12/05/2021 0551   CREATININE 0.72 12/05/2021 0551   GLUCOSE 132 (H) 12/05/2021 0551   CALCIUM 9.0 12/05/2021 0551   Hepatic Function Latest Ref Rng & Units 11/29/2021 11/26/2021 06/23/2021  Total Protein 6.5 - 8.1 g/dL 6.3(L) 7.3 6.4  Albumin 3.5 - 5.0 g/dL 3.0(L) 3.7 4.2  AST 15 - 41 U/L 32 24 22  ALT 0 - 44 U/L 40 20 19  Alk Phosphatase 38 - 126 U/L 78 48 35(L)  Total Bilirubin 0.3 - 1.2 mg/dL 0.7 0.8 0.6  Bilirubin, Direct 0.0 - 0.3 mg/dL - - 0.1    Lab Results  Component Value Date   HGBA1C 6.4 (H) 11/26/2021   Lab Results  Component Value Date   TSH 0.70 06/02/2017     Assessment and Plan:     ICD-10-CM   1. Healthcare maintenance  Z00.00     2. Screening for lung cancer  Z12.2 Ambulatory Referral for Lung Cancer Scre    3. Dysphagia, unspecified type  R13.10 Ambulatory referral to Gastroenterology    4. Odynophagia  R13.10 Ambulatory referral to Gastroenterology    5. Smoker  F17.200     6. Abscess of wrist  L02.419     7. 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 Major depressive disorder, recurrent episode, in partial remission  ([place])  F33.41     9. Pure hypercholesterolemia  E78.00     10. Controlled type 2 diabetes mellitus with diabetic autonomic neuropathy, without long-term current use of insulin ([facility])  E11.43     11. Memory change  R41.3      Did my best to review her Medicare wellness as well as go over preventative measures.  She has multiple ongoing issues over and above General health maintenance.  Total encounter time: 30 minutes. This includes total time spent on the day of encounter.   Ongoing dysphagia and odynophagia, consult GI Moderate depression recurrent with exacerbation.  I counseled the best that I could.  I am going to increase her Prozac dose from 40 mg to 80 mg.  She previously was on this higher dose in the past.  She also continues to be on Wellbutrin. Diabetes.  Maintain current regimen.  Diet controlled at this point. Hyperlipidemia.  Continue Crestor 40 mg.  She also is taking Zetia. Ongoing smoker.  Smoker for many years.  Lung cancer screening. Memory change: Mini-Mental status exam is normal.  Think that is okay just to watch this, and I would not want to start any medication in this patient unless there is a clear decline.  Health Maintenance Exam: The patient's preventative maintenance and recommended screening tests for an annual wellness exam were reviewed in full today. Brought up to date unless services declined.  Counselled on the importance of diet, exercise, and its role in overall health and mortality. The patient's FH and SH was reviewed, including their home life, tobacco status, and drug and alcohol status.  Follow-up in 1 year for physical exam or additional follow-up below.  I have personally reviewed the Medicare Annual Wellness questionnaire and have noted 1. The patient's medical and social history 2. Their use of alcohol, tobacco or illicit drugs 3. Their current medications and supplements 4. The patient's functional ability including ADL's, fall risks,  home safety risks and hearing or visual             impairment. 5. Diet and physical activities 6. Evidence for depression or mood disorders 7. Reviewed Updated provider list, see scanned forms and [facility] Snapshot.  8. Reviewed whether or not the patient has HCPOA or living will, and discussed what this means with the patient.  Recommended she bring in a copy for his chart in [facility].  The patients weight, height, BMI and visual acuity have been recorded in the chart I have made referrals, counseling and provided education to the patient based review of the above and I have provided the pt with a written personalized care plan for preventive services.  I have provided the patient with a copy of your personalized plan for preventive services. Instructed to take the time to review along with their updated medication list.  Follow-up: Return in about 3 months (around 03/17/2022) for long-term multiple medical problems. . Or follow-up in 1 year unless noted.  Future Appointments  Date Time Provider [place]  12/23/2021  2:00 PM Rosalyn Gess, OT [facility] None  01/01/2022  4:00 PM Wellington Hampshire, MD [facility]  02/04/2022  2:20 PM Coraima Tibbs, Frederico Hamman, MD [facility]    Meds ordered this encounter  Medications   cyclobenzaprine (FLEXERIL) 10 MG tablet    Sig: TAKE 1 TABLET THREE TIMES DAILY AS NEEDED FOR MUSCLE SPAMS    Dispense:  60 tablet    Refill:  3   FLUoxetine (PROZAC) 40 MG capsule    Sig: Take 2 capsules (80 mg total) by mouth daily.    Dispense:  180 capsule    Refill:  3   Medications Discontinued During This Encounter  Medication Reason   cyclobenzaprine (FLEXERIL) 10 MG tablet Reorder   FLUoxetine (PROZAC) 40 MG capsule Reorder   Orders Placed This Encounter  Procedures   Ambulatory Referral for [place]   Ambulatory referral to Gastroenterology    Signed,  Frederico Hamman T. Daneil Beem, MD   Allergies as of 12/17/2021       Reactions   Honey Bee  Venom [bee Venom] Anaphylaxis   Diazepam Other (See Comments)   REACTION: pt states she gets very angry and abusive verbally on this medication        Medication List        Accurate as of December 17, 2021 11:59 PM. If you have any questions, ask your nurse or doctor.  acetaminophen 650 MG CR tablet Commonly known as: TYLENOL Take 650 mg by mouth 3 times/day as needed-between meals & bedtime (for headaches).   albuterol 108 (90 Base) MCG/ACT inhaler Commonly known as: VENTOLIN HFA Inhale 2 puffs into the lungs every 4 (four) hours as needed for wheezing or shortness of breath.   amitriptyline 50 MG tablet Commonly known as: ELAVIL TAKE 2 TABLETS BY MOUTH DAILY AT BEDTIME.   amoxicillin-clavulanate 875-125 MG tablet Commonly known as: Augmentin Take 1 tablet by mouth 2 (two) times daily for 14 days.   aspirin EC 81 MG tablet Take 81 mg by mouth daily.   buPROPion 150 MG 24 hr tablet Commonly known as: WELLBUTRIN XL TAKE 1 TABLET (150 MG TOTAL) BY MOUTH DAILY.   calcium carbonate 500 MG chewable tablet Commonly known as: TUMS - dosed in mg elemental calcium Chew 2 tablets by mouth daily as needed for indigestion or heartburn.   clonazePAM 0.5 MG tablet Commonly known as: KLONOPIN Take 1 tablet by mouth twice daily as needed   cyclobenzaprine 10 MG tablet Commonly known as: FLEXERIL TAKE 1 TABLET THREE TIMES DAILY AS NEEDED FOR MUSCLE SPAMS   dimenhyDRINATE 50 MG tablet Commonly known as: DRAMAMINE Take 50 mg by mouth every 8 (eight) hours as needed.   ezetimibe 10 MG tablet Commonly known as: ZETIA Take 1 tablet (10 mg total) by mouth daily.   FLUoxetine 40 MG capsule Commonly known as: PROZAC Take 2 capsules (80 mg total) by mouth daily. What changed: how much to take Changed by: Owens Loffler, MD   gabapentin 100 MG capsule Commonly known as: NEURONTIN Take 1 capsule (100 mg total) by mouth at bedtime.   guaiFENesin 600 MG 12 hr  tablet Commonly known as: MUCINEX Take 600 mg by mouth daily.   metoprolol tartrate 25 MG tablet Commonly known as: LOPRESSOR TAKE 1/2 TABLET TWICE DAILY   multivitamin tablet Take 1 tablet by mouth daily.   naproxen sodium 220 MG tablet Commonly known as: ALEVE Take 220 mg by mouth daily.   pantoprazole 40 MG tablet Commonly known as: PROTONIX TAKE 1 TABLET 30 MINUTES PRIOR TO BREAKFAST   polycarbophil 625 MG tablet Commonly known as: FIBERCON Take 625 mg by mouth daily as needed. FIBER THERAPY   rosuvastatin 40 MG tablet Commonly known as: CRESTOR TAKE 1 TABLET EVERY DAY   Simethicone 125 MG Caps Take 125 mg by mouth daily as needed (gas).   traMADol 50 MG tablet Commonly known as: ULTRAM Take 50 mg by mouth every 6 (six) hours as needed.   vitamin C 1000 MG tablet Take 1,000 mg by mouth daily.   Vitamin D 1000 units capsule Take 1,000 Units by mouth daily.   vitamin E 180 MG (400 UNITS) capsule Take 400 Units by mouth daily.

## 2021-12-20 ENCOUNTER — Encounter: Payer: Self-pay | Admitting: Emergency Medicine

## 2021-12-20 ENCOUNTER — Emergency Department: Payer: Medicare Other

## 2021-12-20 ENCOUNTER — Emergency Department
Admission: EM | Admit: 2021-12-20 | Discharge: 2021-12-20 | Disposition: A | Discharge status: Home / Self Care | Payer: Medicare Other | Attending: Emergency Medicine | Admitting: Emergency Medicine

## 2021-12-20 ENCOUNTER — Other Ambulatory Visit: Payer: Self-pay

## 2021-12-20 DIAGNOSIS — S022XXA Fracture of nasal bones, initial encounter for closed fracture: Secondary | ICD-10-CM | POA: Insufficient documentation

## 2021-12-20 DIAGNOSIS — Z743 Need for continuous supervision: Secondary | ICD-10-CM | POA: Diagnosis not present

## 2021-12-20 DIAGNOSIS — S80919A Unspecified superficial injury of unspecified knee, initial encounter: Secondary | ICD-10-CM | POA: Diagnosis not present

## 2021-12-20 DIAGNOSIS — S0219XA Other fracture of base of skull, initial encounter for closed fracture: Secondary | ICD-10-CM | POA: Diagnosis not present

## 2021-12-20 DIAGNOSIS — I10 Essential (primary) hypertension: Secondary | ICD-10-CM | POA: Diagnosis not present

## 2021-12-20 DIAGNOSIS — I2510 Atherosclerotic heart disease of native coronary artery without angina pectoris: Secondary | ICD-10-CM | POA: Diagnosis not present

## 2021-12-20 DIAGNOSIS — S0083XA Contusion of other part of head, initial encounter: Secondary | ICD-10-CM | POA: Insufficient documentation

## 2021-12-20 DIAGNOSIS — M79642 Pain in left hand: Secondary | ICD-10-CM | POA: Diagnosis not present

## 2021-12-20 DIAGNOSIS — S0990XA Unspecified injury of head, initial encounter: Secondary | ICD-10-CM

## 2021-12-20 DIAGNOSIS — W010XXA Fall on same level from slipping, tripping and stumbling without subsequent striking against object, initial encounter: Secondary | ICD-10-CM | POA: Diagnosis not present

## 2021-12-20 DIAGNOSIS — Y92481 Parking lot as the place of occurrence of the external cause: Secondary | ICD-10-CM | POA: Insufficient documentation

## 2021-12-20 DIAGNOSIS — R52 Pain, unspecified: Secondary | ICD-10-CM | POA: Diagnosis not present

## 2021-12-20 DIAGNOSIS — S62346A Nondisplaced fracture of base of fifth metacarpal bone, right hand, initial encounter for closed fracture: Secondary | ICD-10-CM | POA: Diagnosis not present

## 2021-12-20 DIAGNOSIS — R58 Hemorrhage, not elsewhere classified: Secondary | ICD-10-CM | POA: Diagnosis not present

## 2021-12-20 DIAGNOSIS — G8911 Acute pain due to trauma: Secondary | ICD-10-CM | POA: Diagnosis not present

## 2021-12-20 DIAGNOSIS — J449 Chronic obstructive pulmonary disease, unspecified: Secondary | ICD-10-CM | POA: Diagnosis not present

## 2021-12-20 DIAGNOSIS — S6991XA Unspecified injury of right wrist, hand and finger(s), initial encounter: Secondary | ICD-10-CM | POA: Diagnosis present

## 2021-12-20 MED ORDER — OXYMETAZOLINE HCL 0.05 % NA SOLN
1.0000 | Freq: Once | NASAL | Status: DC
  Filled 2021-12-20: qty 30

## 2021-12-20 MED ORDER — BACITRACIN-NEOMYCIN-POLYMYXIN 400-5-5000 EX OINT
TOPICAL_OINTMENT | Freq: Once | CUTANEOUS | Status: DC
  Filled 2021-12-20: qty 1

## 2021-12-20 MED ORDER — HYDROCODONE-ACETAMINOPHEN 5-325 MG PO TABS: 1.0000 | ORAL_TABLET | Freq: Three times a day (TID) | ORAL | 0 refills | Status: AC | PRN

## 2021-12-20 MED ORDER — MUPIROCIN 2 % EX OINT
1.0000 | TOPICAL_OINTMENT | Freq: Two times a day (BID) | CUTANEOUS | 0 refills | Status: AC
Start: 2021-12-20 — End: 2021-12-30

## 2021-12-20 MED ORDER — HYDROCODONE-ACETAMINOPHEN 5-325 MG PO TABS
1.0000 | ORAL_TABLET | Freq: Once | ORAL | Status: AC
  Administered 2021-12-20: 1 via ORAL
  Filled 2021-12-20: qty 1

## 2021-12-20 MED ORDER — ONDANSETRON 4 MG PO TBDP
4.0000 mg | ORAL_TABLET | Freq: Once | ORAL | Status: AC
  Administered 2021-12-20: 4 mg via ORAL
  Filled 2021-12-20: qty 1

## 2021-12-20 MED ORDER — ONDANSETRON 4 MG PO TBDP: 4.0000 mg | ORAL_TABLET | Freq: Three times a day (TID) | ORAL | 0 refills | Status: DC | PRN

## 2021-12-20 NOTE — Discharge Instructions (Addendum)
Your exam is reassuring.  You do have a nasal fracture that is confirmed by CT.  Your head CT is normal at this time.  You also have fracture to the base of your pinky finger.  Keep the splint in place until you are evaluated by orthopedics.  You may follow-up with ENT for any ongoing problems with your nose or sinuses.  Rest with the head elevated and apply ice to the face to reduce swelling.  Avoid blowing the nose or nose picking until your breathing is improved.  You may use the Afrin as needed for controlling any active nosebleeds.  Return to the ED if necessary.

## 2021-12-20 NOTE — ED Triage Notes (Signed)
First Nurse note:  tripped off of curb in parking lot.  Arrives via [facility].  Nose bleeding noted, nose packed.  C/O left hip pain, right knee pain.  VS wnl

## 2021-12-20 NOTE — ED Provider Triage Note (Signed)
Emergency Medicine Provider Triage Evaluation Note  CRISTI GWYNN, a 71 y.o. female  was evaluated in triage.  Pt complains of facial trauma s/p mechanical fall.  She presents to the ED with her daughter, with facial trauma including nosebleed, right hand swelling, and facial abrasions.  Patient reports her shoe got caught on the curb as she stepped up, causing her to fall face forward onto the ground.  She denies any LOC, nausea, vomiting, or weakness.  She presents to the ED with injuries, for evaluation.  She denies any blood thinner use.  Review of Systems  Positive: Facial abrasions, facial trauma, hand injury Negative: LOC, NV  Physical Exam  BP 112/65 (BP Location: Left Arm)    Pulse 84    Temp 98.4 F (36.9 C) (Oral)    Resp 18    Ht 5\' 7"  (1.702 m)    Wt 90.7 kg    SpO2 97%    BMI 31.32 kg/m  Gen:   Awake, no distress   Resp:  Normal effort CTA MSK:   Moves extremities without difficulty Right hand with dorsal hematoma ENT  Nose bleed, nasal edema/deformity, facial brasions  Medical Decision Making  Medically screening exam initiated at 12:43 PM.  Appropriate orders placed.  LEIANA RUND was informed that the remainder of the evaluation will be completed by another provider, this initial triage assessment does not replace that evaluation, and the importance of remaining in the ED until their evaluation is complete.  Geriatric patient with ED evaluation of injury sustained following mechanical fall.  Patient presents with facial trauma and nosebleed as well as right hand pain after mechanical fall.   Melvenia Needles, PA-C 12/20/21 1246

## 2021-12-20 NOTE — ED Provider Notes (Signed)
[facility] Provider Note  Patient Contact: 4:12 PM (approximate)   History   Fall   HPI  Amanda [name] is a 71 y.o. female with a history of hypertension, CAD and COPD, presents to the ED for evaluation of injuries following mechanical fall.  Patient was with her daughter, when she apparently tripped stepping up on the curb in the parking lot, and face planted on the sidewalk.  There is no reported LOC, the patient had immediate bleeding from the nose and obvious nasal deformity.  She also reports some right hand pain at the pinky.  She presents to the ED via EMS for evaluation of her injuries.  Physical Exam   Triage Vital Signs: ED Triage Vitals  Enc Vitals Group     BP 12/20/21 1240 112/65     Pulse Rate 12/20/21 1240 84     Resp 12/20/21 1240 18     Temp 12/20/21 1240 98.4 F (36.9 C)     Temp Source 12/20/21 1240 Oral     SpO2 12/20/21 1240 97 %     Weight 12/20/21 1215 217 lb 13 oz (98.8 kg)     Height 12/20/21 1215 5' 4.5" (1.638 m)     Head Circumference --      Peak Flow --      Pain Score 12/20/21 1215 4     Pain Loc --      Pain Edu? --      Excl. in [place]? --     Most recent vital signs: Vitals:   12/20/21 1626 12/20/21 1912  BP: 132/68 130/82  Pulse: 81 80  Resp: 18 18  Temp: 98.2 F (36.8 C) 98.2 F (36.8 C)  SpO2: 100% 99%     General: Alert and in no acute distress. Head: NCAT except for abrasion across the forehead and nasal bridge.  Patient with obvious soft tissue swelling deformity across the nasal bridge. Eyes:  PERRL. EOMI. Periorbital ecchymosis noted bilaterally Ears: TMs intact. No hemotympanum Nose: No active epistaxis. Nasal septum is deviated without hematoma.  Mouth/Throat: Mucous membranes are moist. Neck: No stridor. No cervical spine tenderness to palpation. Cardiovascular:  Good peripheral perfusion Respiratory: Normal respiratory effort without tachypnea or retractions. Lungs CTAB.  Musculoskeletal:  Full range of motion to all extremities.  Right hand with obvious dorsal hematoma over the third and fourth metacarpals.  Deformity ecchymosis to the base of the fifth Neurologic:  No gross focal neurologic deficits are appreciated.  Skin:   No rash noted Other:   ED Results / Procedures / Treatments   Labs (all labs ordered are listed, but only abnormal results are displayed) Labs Reviewed - No data to display   EKG   RADIOLOGY  I personally viewed and evaluated these images as part of my medical decision making, as well as reviewing the written report by the radiologist.  ED Provider Interpretation: agree with report  No results found.  PROCEDURES:  Critical Care performed: No  .Splint Application  Date/Time: 12/20/2021 4:48 PM Performed by: Melvenia Needles, PA-C Authorized by: Melvenia Needles, PA-C   Consent:    Consent obtained:  Verbal   Consent given by:  Patient   Risks, benefits, and alternatives were discussed: yes     Risks discussed:  Discoloration Universal protocol:    Procedure explained and questions answered to patient or proxy's satisfaction: yes     Imaging studies available: yes     Site/side marked:  yes     Patient identity confirmed:  Verbally with patient Pre-procedure details:    Distal neurologic exam:  Normal   Distal perfusion: distal pulses strong   Procedure details:    Location:  Finger   Finger location:  R small finger   Strapping: no     Splint type:  Ulnar gutter   Supplies:  Cotton padding, fiberglass and elastic bandage   Attestation: Splint applied and adjusted personally by me   Post-procedure details:    Distal neurologic exam:  Normal   Distal perfusion: distal pulses strong     Procedure completion:  Tolerated well, no immediate complications   Post-procedure imaging: not applicable     MEDICATIONS ORDERED IN ED: Medications  HYDROcodone-acetaminophen (NORCO/VICODIN) 5-325 MG per tablet 1 tablet (1  tablet Oral Given 12/20/21 1650)  ondansetron (ZOFRAN-ODT) disintegrating tablet 4 mg (4 mg Oral Given 12/20/21 1650)     IMPRESSION / MDM / [place] / ED COURSE  I reviewed the triage vital signs and the nursing notes.                              Differential diagnosis includes, but is not limited to, facial abrasion, facial fractures, SDH, hand fractures  Geriatric patient ED evaluation of injury sustained following a mechanical fall.  Patient presents after tripping over the curb.  She sustained significant trauma to the face including nosebleed and nasal deformity and swelling.  Patient was evaluated for her complaints in ED with imaging including CT of the head and face.  Those images reveal bilateral nasal bone and septal fractures with some deviation, as reviewed by me.  No acute intracranial process on head CT per report, and consistent with my review.  Patient is stable at this time without any active or ongoing nosebleed or evidence of any septal hematoma.  She is alert and active at this time.  Patient's superficial wounds will be treated with antibiotic ointment.  She is placed in appropriate ulnar gutter splint for her fifth digit proximal phalanx fracture.  Patient's diagnosis is consistent with nasal fracture, facial abrasion, and fifth digit fracture secondary to fall. Patient will be discharged home with prescriptions for hydrocodone, mupirocin ointment, and Zofran. Patient is to follow up with ENT or her primary provider as well as Ortho for associated fractures and injuries. Patient is given ED precautions to return to the ED for any worsening or new symptoms.   FINAL CLINICAL IMPRESSION(S) / ED DIAGNOSES   Final diagnoses:  Minor head injury, initial encounter  Contusion of face, initial encounter  Closed fracture of nasal bone, initial encounter  Closed nondisplaced fracture of base of fifth metacarpal bone of right hand, initial encounter     Rx / DC Orders    ED Discharge Orders          Ordered    ondansetron (ZOFRAN-ODT) 4 MG disintegrating tablet  Every 8 hours PRN        12/20/21 1755    HYDROcodone-acetaminophen (NORCO) 5-325 MG tablet  3 times daily PRN        12/20/21 1755    mupirocin ointment (BACTROBAN) 2 %  2 times daily        12/20/21 1756             Note:  This document was prepared using Dragon voice recognition software and may include unintentional dictation errors.    Luken Shadowens  Nonda Lou, PA-C 12/21/21 1849    Nena Polio, MD 12/22/21 2147

## 2021-12-22 ENCOUNTER — Telehealth: Payer: Self-pay

## 2021-12-22 NOTE — Telephone Encounter (Signed)
Patient called for an appointment she is scheduled now for 02/05/2022 at 3

## 2021-12-23 ENCOUNTER — Ambulatory Visit: Payer: Medicare Other | Admitting: Occupational Therapy

## 2021-12-30 ENCOUNTER — Other Ambulatory Visit: Payer: Medicare Other

## 2021-12-31 DIAGNOSIS — S62616A Displaced fracture of proximal phalanx of right little finger, initial encounter for closed fracture: Secondary | ICD-10-CM | POA: Diagnosis not present

## 2022-01-01 ENCOUNTER — Other Ambulatory Visit: Payer: Self-pay

## 2022-01-01 ENCOUNTER — Encounter: Payer: Self-pay | Admitting: Cardiovascular Disease

## 2022-01-01 ENCOUNTER — Ambulatory Visit (INDEPENDENT_AMBULATORY_CARE_PROVIDER_SITE_OTHER): Payer: Medicare Other | Admitting: Cardiovascular Disease

## 2022-01-01 VITALS — BP 128/64 | HR 82 | Ht 67.0 in | Wt 222.2 lb

## 2022-01-01 DIAGNOSIS — R002 Palpitations: Secondary | ICD-10-CM

## 2022-01-01 DIAGNOSIS — Z720 Tobacco use: Secondary | ICD-10-CM | POA: Diagnosis not present

## 2022-01-01 DIAGNOSIS — I350 Nonrheumatic aortic (valve) stenosis: Secondary | ICD-10-CM | POA: Diagnosis not present

## 2022-01-01 DIAGNOSIS — R0989 Other specified symptoms and signs involving the circulatory and respiratory systems: Secondary | ICD-10-CM

## 2022-01-01 DIAGNOSIS — I2510 Atherosclerotic heart disease of native coronary artery without angina pectoris: Secondary | ICD-10-CM

## 2022-01-01 DIAGNOSIS — S62346A Nondisplaced fracture of base of fifth metacarpal bone, right hand, initial encounter for closed fracture: Secondary | ICD-10-CM | POA: Diagnosis not present

## 2022-01-01 DIAGNOSIS — E785 Hyperlipidemia, unspecified: Secondary | ICD-10-CM | POA: Diagnosis not present

## 2022-01-01 NOTE — Progress Notes (Signed)
Cardiology Office Note   Date:  01/01/2022   ID:  Amanda [name], DOB 1951-11-25, MRN [id number]  PCP:  Owens Loffler, MD  Cardiologist:   Kathlyn Sacramento, MD   Chief Complaint  Patient presents with   Other    6 month f/u no complaints today. Meds reviewed verbally with pt.      History of Present Illness: Amanda [name] is a 71 y.o. female who presents for a follow-up regarding moderate aortic stenosis and nonobstructive coronary artery disease.  She has other chronic medical conditions that include COPD, tobacco use, diet-controlled diabetes, hypertension and hyperlipidemia. CTA of the coronary arteries in September 2019 showed a calcium score of 521 with moderate nonobstructive disease affecting the RCA and LAD.  These were not significant by FFR. Echocardiogram in September 2020 showed an EF of 60 to 65% with moderate aortic stenosis.  Mean gradient was 19 mmHg with valve area of 1.28. She had a repeat echocardiogram recently which showed an EF of 60 to 05%, grade 1 diastolic dysfunction, moderate aortic stenosis with a mean gradient of 22 mmHg and valve area of 1.29.  She was hospitalized in December 2021 with chest pain and worsening exertional dyspnea. I proceeded with a right and left cardiac catheterization which showed mild nonobstructive coronary artery disease, right heart catheterization showed normal filling pressures, normal pulmonary pressure and normal cardiac output.  Aortic stenosis was moderate with a mean gradient of 24 mmHg and valve area of 1.44 cm.  She fell in August and broke 2 ribs.  She was hospitalized in December with right hand cellulitis/abscess due to infected cat bite and was treated with antibiotics.  She had increased swelling and was rehospitalized for IV antibiotics.  She fell recently and had some facial contusion.  She did not have syncope.  She denies chest pain or worsening dyspnea.  She continues to smoke half a pack per  day.   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 2013: nl LV sys fxn, mild MR, no evidence of pulm htn; b. TTE 8/17: EF 60-65%, no RWMA, nl LV dia fxn,  mild AS, mod AI   CAD (coronary artery disease), native coronary artery 11/26/2020   Controlled type 2 diabetes mellitus with diabetic autonomic neuropathy, without long-term current use of insulin ([place]) 08/10/2007   Qualifier: Diagnosis of  By: Council Mechanic MD, Hilaria Ota    COPD (chronic obstructive pulmonary disease) ([place]) 10/30/2016   Depression    GAD (generalized anxiety disorder)    GERD (gastroesophageal reflux disease)    Hiatal hernia with gastroesophageal reflux 1997   Hyperlipidemia    Hypertension    Osteoporosis    Tobacco abuse     Past Surgical History:  Procedure Laterality Date   ABDOMINAL HYSTERECTOMY  1993   ANTERIOR CERVICAL DECOMP/DISCECTOMY FUSION N/A 10/26/2018   Procedure: ACDF C4-C5 C5-C6 C6-C7;  Surgeon: Kary Kos, MD;  Location: [facility];  Service: Neurosurgery;  Laterality: N/A;   APPENDECTOMY     BACK SURGERY     BREAST BIOPSY Right 2007   benign   CHOLECYSTECTOMY     COLONOSCOPY     30 years ago was normal per pt.    DILATION AND CURETTAGE OF UTERUS     ESOPHAGEAL DILATION     x 3   fractured leg Right    INCISION AND DRAINAGE Right 12/02/2021  Procedure: INCISION AND DRAINAGE;  Surgeon: Hessie Knows, MD;  Location: [facility];  Service: Orthopedics;  Laterality: Right;   LEFT HEART CATH AND CORONARY ANGIOGRAPHY N/A 11/11/2020   Procedure: LEFT HEART CATH AND CORONARY ANGIOGRAPHY;  Surgeon: Wellington Hampshire, MD;  Location: [facility];  Service: Cardiovascular;  Laterality: N/A;   OOPHORECTOMY     OVARIAN CYST REMOVAL  1972   SALIVARY GLAND SURGERY     TONSILLECTOMY     4 yoa   TOTAL KNEE ARTHROPLASTY Left 12/24/2015   Procedure: LEFT TOTAL KNEE ARTHROPLASTY;  Surgeon: [name], MD;  Location: [facility];  Service:  Orthopedics;  Laterality: Left;   TOTAL KNEE ARTHROPLASTY Right 12/30/2017   Procedure: RIGHT TOTAL KNEE ARTHROPLASTY;  Surgeon: [name], MD;  Location: [facility];  Service: Orthopedics;  Laterality: Right;   WRIST FRACTURE SURGERY  11/2008     Current Outpatient Medications  Medication Sig Dispense Refill   acetaminophen (TYLENOL) 650 MG CR tablet Take 650 mg by mouth 3 times/day as needed-between meals & bedtime (for headaches).     albuterol (PROVENTIL HFA;VENTOLIN HFA) 108 (90 Base) MCG/ACT inhaler Inhale 2 puffs into the lungs every 4 (four) hours as needed for wheezing or shortness of breath. 1 Inhaler 5   amitriptyline (ELAVIL) 50 MG tablet TAKE 2 TABLETS BY MOUTH DAILY AT BEDTIME. 180 tablet 1   Ascorbic Acid (VITAMIN C) 1000 MG tablet Take 1,000 mg by mouth daily.     aspirin EC 81 MG tablet Take 81 mg by mouth daily.     buPROPion (WELLBUTRIN XL) 150 MG 24 hr tablet TAKE 1 TABLET (150 MG TOTAL) BY MOUTH DAILY. 90 tablet 1   calcium carbonate (TUMS - DOSED IN MG ELEMENTAL CALCIUM) 500 MG chewable tablet Chew 2 tablets by mouth daily as needed for indigestion or heartburn.     Cholecalciferol (VITAMIN D) 1000 UNITS capsule Take 1,000 Units by mouth daily.      clonazePAM (KLONOPIN) 0.5 MG tablet Take 1 tablet by mouth twice daily as needed 30 tablet 5   cyclobenzaprine (FLEXERIL) 10 MG tablet TAKE 1 TABLET THREE TIMES DAILY AS NEEDED FOR MUSCLE SPAMS 60 tablet 3   dimenhyDRINATE (DRAMAMINE) 50 MG tablet Take 50 mg by mouth every 8 (eight) hours as needed.     ezetimibe (ZETIA) 10 MG tablet Take 1 tablet (10 mg total) by mouth daily. 90 tablet 3   FLUoxetine (PROZAC) 40 MG capsule Take 2 capsules (80 mg total) by mouth daily. 180 capsule 3   gabapentin (NEURONTIN) 100 MG capsule Take 1 capsule (100 mg total) by mouth at bedtime. 30 capsule 3   guaiFENesin (MUCINEX) 600 MG 12 hr tablet Take 600 mg by mouth daily.     metoprolol tartrate (LOPRESSOR) 25 MG tablet TAKE 1/2 TABLET  TWICE DAILY 90 tablet 3   Multiple Vitamin (MULTIVITAMIN) tablet Take 1 tablet by mouth daily.     naproxen sodium (ALEVE) 220 MG tablet Take 220 mg by mouth daily.     ondansetron (ZOFRAN-ODT) 4 MG disintegrating tablet Take 1 tablet (4 mg total) by mouth every 8 (eight) hours as needed for nausea or vomiting. 15 tablet 0   pantoprazole (PROTONIX) 40 MG tablet TAKE 1 TABLET 30 MINUTES PRIOR TO BREAKFAST 90 tablet 3   polycarbophil (FIBERCON) 625 MG tablet Take 625 mg by mouth daily as needed. FIBER THERAPY     rosuvastatin (CRESTOR) 40 MG tablet TAKE 1 TABLET EVERY DAY 90 tablet  3   Simethicone 125 MG CAPS Take 125 mg by mouth daily as needed (gas).     traMADol (ULTRAM) 50 MG tablet Take 50 mg by mouth every 6 (six) hours as needed.     vitamin E 180 MG (400 UNITS) capsule Take 400 Units by mouth daily.     No current facility-administered medications for this visit.    Allergies:   Honey bee venom [bee venom] and Diazepam    Social History:  The patient  reports that she has been smoking cigarettes. She has a 33.75 pack-year smoking history. She has never used smokeless tobacco. She reports current alcohol use. She reports that she does not use drugs.   Family History:  The patient's family history includes Arthritis in her mother; Breast cancer in her sister; Cancer in her paternal grandmother; Colon polyps in her mother and sister; Diabetes in her mother; Heart disease in her mother; Hypercholesterolemia in her sister; Hyperlipidemia in her mother.    ROS:  Please see the history of present illness.   Otherwise, review of systems are positive for none.   All other systems are reviewed and negative.    PHYSICAL EXAM: VS:  BP 128/64 (BP Location: Left Arm, Patient Position: Sitting, Cuff Size: Normal)    Pulse 82    Ht 5\' 7"  (1.702 m)    Wt 222 lb 4 oz (100.8 kg)    SpO2 98%    BMI 34.81 kg/m  , BMI Body mass index is 34.81 kg/m. GEN: Well nourished, well developed, in no acute  distress  HEENT: normal  Neck: no JVD, or masses.  Bilateral carotid bruits Cardiac: RRR; no  rubs, or gallops,no edema . 2/6 crescendo decrescendo systolic murmur in the aortic area which is  mid peaking. Respiratory:  clear to auscultation bilaterally, normal work of breathing GI: soft, nontender, nondistended, + BS MS: no deformity or atrophy  Skin: warm and dry, no rash Neuro:  Strength and sensation are intact Psych: euthymic mood, full affect   EKG:  EKG is not ordered today. Recent EKG was reviewed which showed sinus rhythm with right bundle branch block.   Recent Labs: 11/29/2021: ALT 40 12/05/2021: BUN 7; Creatinine, Ser 0.72; Hemoglobin 12.2; Platelets 349; Potassium 4.1; Sodium 139    Lipid Panel    Component Value Date/Time   CHOL 129 06/23/2021 1306   CHOL 201 (H) 09/18/2013 1016   TRIG 74.0 06/23/2021 1306   HDL 54.50 06/23/2021 1306   HDL 53 09/18/2013 1016   CHOLHDL 2 06/23/2021 1306   VLDL 14.8 06/23/2021 1306   LDLCALC 60 06/23/2021 1306   LDLCALC 130 (H) 09/18/2013 1016   LDLDIRECT 209.2 05/28/2009 0909      Wt Readings from Last 3 Encounters:  01/01/22 222 lb 4 oz (100.8 kg)  12/20/21 200 lb (90.7 kg)  12/17/21 218 lb (98.9 kg)         ASSESSMENT AND PLAN:   1.  Mild nonobstructive coronary artery disease: Cardiac catheterization in December of 2021 showed mild nonobstructive disease.  Recommend continuing medical therapy and low-dose aspirin.  I   2.  Aortic stenosis: I reviewed most recent results of echocardiogram with her which was done last month while hospitalized.  It showed moderate aortic stenosis with slight increase in mean gradient from 22 to 29 mmHg.  Recommend repeat echocardiogram in December of this year.  3.  Palpitations: Previous mild PACs. Symptoms are well-controlled on small dose metoprolol.  4 . Tobacco use:  I discussed the importance of smoking cessation.  5.  Hyperlipidemia: Continue high-dose rosuvastatin and  Zetia with a target LDL of less than 70.  I reviewed most recent lipid profile which showed an LDL of 60.  6.  Bilateral carotid bruits: Carotid Doppler in July of last year showed mild nonobstructive disease less than 40%.   Disposition:   FU with me in 6 months  Signed,  Kathlyn Sacramento, MD  01/01/2022 4:22 PM    [facility]

## 2022-01-01 NOTE — Patient Instructions (Signed)

## 2022-01-09 DIAGNOSIS — Z20822 Contact with and (suspected) exposure to covid-19: Secondary | ICD-10-CM | POA: Diagnosis not present

## 2022-01-14 DIAGNOSIS — M79644 Pain in right finger(s): Secondary | ICD-10-CM | POA: Diagnosis not present

## 2022-01-14 DIAGNOSIS — S62616D Displaced fracture of proximal phalanx of right little finger, subsequent encounter for fracture with routine healing: Secondary | ICD-10-CM | POA: Diagnosis not present

## 2022-01-15 ENCOUNTER — Other Ambulatory Visit: Payer: Self-pay | Admitting: *Deleted

## 2022-01-15 MED ORDER — FLUOXETINE HCL 40 MG PO CAPS: 80.0000 mg | ORAL_CAPSULE | Freq: Every day | ORAL | 3 refills | Status: DC

## 2022-01-15 MED ORDER — GABAPENTIN 100 MG PO CAPS
100.0000 mg | ORAL_CAPSULE | Freq: Every day | ORAL | 3 refills | Status: DC
Start: 2022-01-15 — End: 2022-08-12

## 2022-01-15 NOTE — Telephone Encounter (Signed)
Last office visit 12/17/2021 for CPE.  Last refilled 09/09/2021 for #30 with 3 refills.  Next Appt: 02/04/2022 for 6 months follow up.  Patient is switching to [employer].

## 2022-01-29 DIAGNOSIS — S62616D Displaced fracture of proximal phalanx of right little finger, subsequent encounter for fracture with routine healing: Secondary | ICD-10-CM | POA: Diagnosis not present

## 2022-02-03 ENCOUNTER — Telehealth: Payer: Self-pay | Admitting: Family Medicine

## 2022-02-03 NOTE — Telephone Encounter (Signed)
Amanda [name] stated that over the weekend she fell and she has bruises and she stated it feels like hurt her rib. She wanted to let Dr. Lorelei Pont know before her visit

## 2022-02-04 ENCOUNTER — Encounter: Payer: Self-pay | Admitting: Family Medicine

## 2022-02-04 ENCOUNTER — Ambulatory Visit (INDEPENDENT_AMBULATORY_CARE_PROVIDER_SITE_OTHER): Payer: Medicare Other | Admitting: Family Medicine

## 2022-02-04 ENCOUNTER — Other Ambulatory Visit: Payer: Self-pay

## 2022-02-04 VITALS — BP 150/70 | HR 86 | Temp 98.6°F | Ht 67.0 in | Wt 221.4 lb

## 2022-02-04 DIAGNOSIS — S022XXS Fracture of nasal bones, sequela: Secondary | ICD-10-CM

## 2022-02-04 DIAGNOSIS — I2510 Atherosclerotic heart disease of native coronary artery without angina pectoris: Secondary | ICD-10-CM | POA: Diagnosis not present

## 2022-02-04 DIAGNOSIS — R0781 Pleurodynia: Secondary | ICD-10-CM | POA: Diagnosis not present

## 2022-02-04 DIAGNOSIS — F3341 Major depressive disorder, recurrent, in partial remission: Secondary | ICD-10-CM

## 2022-02-04 DIAGNOSIS — E1143 Type 2 diabetes mellitus with diabetic autonomic (poly)neuropathy: Secondary | ICD-10-CM

## 2022-02-04 NOTE — Progress Notes (Signed)
? ? ?Amanda Kist T. Wilmary Levit, MD, [facility] ?Therapist, music at [facility] ?[place] ?[place], [address] ? ?Phone: [phone]  FAX: [phone] ? ?EMIE Amanda [name] - 71 y.o. female  MRN [id number]  Date of Birth: November 20, 1951 ? ?Date: 02/04/2022  PCP: Owens Loffler, MD  Referral: Owens Loffler, MD ? ?Chief Complaint  ?Patient presents with  ? Fall  ?  Last Friday-rib pain on left mid back ?  ? Depression  ?  Follow up increase dosage of Prozac  ? ? ?This visit occurred during the SARS-CoV-2 public health emergency.  Safety protocols were in place, including screening questions prior to the visit, additional usage of staff PPE, and extensive cleaning of exam room while observing appropriate contact time as indicated for disinfecting solutions.  ? ?Subjective:  ? ?Amanda [name] is a 71 y.o. very pleasant female patient with Body mass index is 34.68 kg/m?. who presents with the following: ? ?She is here to follow-up on multiple issues, but the primary thing that I wanted her follow-up with was on her depression.  Did increase her Prozac the last time she was here in the office, and on 6-week follow-up she is doing better.  She has had ongoing chronic depression for many years and a recent exacerbation. ? ?She has had a complicated medical course over the last year including severe infection from a cat bite and Pasteurella requiring operative intervention and long-term antibiotics in December. ? ?She has had multiple falls, most recently last Friday.  Now she is having pain on the left side with some bruising and significant pain.  She has had multiple rib fractures in the past. ? ?In January she also fell and sustained a mild head injury as well as a displaced finger fracture. ? ?She also has chronic neuropathy, and she does take gabapentin 100 mg at night.  She does think that this is slightly improved, but still is at baseline ongoing problem. ? ?With her fall in January, she  also fell and sustained a displaced nasal fracture, and she has not had any follow-up with this.  She does have a left sided deviation.  Fracture did extend to the ethmoid sinuses based on chart review, and the only symptom she really is having now is some occasional stuffiness on the left side which is improved by nasal spray.  She ultimately did not have any kind of ENT follow-up ? ?12/20/2021 - Comminuted fx b nasal bones and nasal septum with L deviation. - extends to the ethmoid sinuses.  ?- now looks ok with some L sinus decrease.  ? ?Dr. Milinda Pointer placed her on gabapentin. ?- increase to 200 mg. ? ? ?Review of Systems is noted in the HPI, as appropriate ? ?Objective:  ? ?BP (!) 150/70   Pulse 86   Temp 98.6 ?F (37 ?C) (Temporal)   Ht 5\' 7"  (1.702 m)   Wt 221 lb 7 oz (100.4 kg)   SpO2 97%   BMI 34.68 kg/m?  ? ?GEN: No acute distress; alert,appropriate. ?PULM: Breathing comfortably in no respiratory distress ?PSYCH: Normally interactive.  ?CV: RRR, loud murmur, aortic by distribution ?PULM: Normal respiratory rate, no accessory muscle use. No wheezes, crackles or rhonchi; ?Quite painful left rib compression with extensive bruising. ? ?Laboratory and Imaging Data: ? ?Assessment and Plan:  ? ?  ICD-10-CM   ?1. Major depressive disorder, recurrent episode, in partial remission ([facility])  F33.41   ?  ?2. Controlled type 2 diabetes mellitus  with diabetic autonomic neuropathy, without long-term current use of insulin ([facility])  E11.43   ?  ?3. Rib pain on left side  R07.81   ?  ?4. Closed displaced fracture of nasal bone, sequela  S02.2XXS   ?  ? ?Total encounter time: 30 minutes. This includes total time spent on the day of encounter.  Extensive chart review on this case.  Review of ER records, imaging, operative reports. ? ?Depression is improving and doing better on higher doses of Prozac. ? ?Increase gabapentin dosing at night to 200 mg.  She has a long way that she could increase this as well. ? ?Almost certainly she  has some rib fractures, and she is comfortable managing this conservatively as she has done in the past. ? ?In terms of her very significant nasal bone fracture, at this point it is healed and not causing any pain.  I really do not see anything from a cosmetic standpoint.  By history it sounds as if she does have some mild impairment to the sinus, but she does not want to do anything at this point, I think that that is entirely appropriate. ? ?No orders of the defined types were placed in this encounter. ? ?Medications Discontinued During This Encounter  ?Medication Reason  ? traMADol (ULTRAM) 50 MG tablet Completed Course  ? polycarbophil (FIBERCON) 625 MG tablet Completed Course  ? ?No orders of the defined types were placed in this encounter. ? ? ?Follow-up: Return in about 6 months (around 08/07/2022). ? ?Dragon [employer] One speech-to-text software was used for transcription in this dictation.  Possible transcriptional errors can occur using Editor, commissioning.  ? ?Signed, ? ?Doree Kuehne T. Istvan Behar, MD ? ? ?Outpatient Encounter Medications as of 02/04/2022  ?Medication Sig  ? acetaminophen (TYLENOL) 650 MG CR tablet Take 650 mg by mouth 3 times/day as needed-between meals & bedtime (for headaches).  ? albuterol (PROVENTIL HFA;VENTOLIN HFA) 108 (90 Base) MCG/ACT inhaler Inhale 2 puffs into the lungs every 4 (four) hours as needed for wheezing or shortness of breath.  ? amitriptyline (ELAVIL) 50 MG tablet TAKE 2 TABLETS BY MOUTH DAILY AT BEDTIME.  ? Ascorbic Acid (VITAMIN C) 1000 MG tablet Take 1,000 mg by mouth daily.  ? aspirin EC 81 MG tablet Take 81 mg by mouth daily.  ? buPROPion (WELLBUTRIN XL) 150 MG 24 hr tablet TAKE 1 TABLET (150 MG TOTAL) BY MOUTH DAILY.  ? calcium carbonate (TUMS - DOSED IN MG ELEMENTAL CALCIUM) 500 MG chewable tablet Chew 2 tablets by mouth daily as needed for indigestion or heartburn.  ? Cholecalciferol (VITAMIN D) 1000 UNITS capsule Take 1,000 Units by mouth daily.   ? clonazePAM (KLONOPIN) 0.5  MG tablet Take 1 tablet by mouth twice daily as needed  ? cyclobenzaprine (FLEXERIL) 10 MG tablet TAKE 1 TABLET THREE TIMES DAILY AS NEEDED FOR MUSCLE SPAMS  ? dimenhyDRINATE (DRAMAMINE) 50 MG tablet Take 50 mg by mouth every 8 (eight) hours as needed.  ? ezetimibe (ZETIA) 10 MG tablet Take 1 tablet (10 mg total) by mouth daily.  ? FLUoxetine (PROZAC) 40 MG capsule Take 2 capsules (80 mg total) by mouth daily.  ? gabapentin (NEURONTIN) 100 MG capsule Take 1 capsule (100 mg total) by mouth at bedtime.  ? guaiFENesin (MUCINEX) 600 MG 12 hr tablet Take 600 mg by mouth daily.  ? metoprolol tartrate (LOPRESSOR) 25 MG tablet TAKE 1/2 TABLET TWICE DAILY  ? Multiple Vitamin (MULTIVITAMIN) tablet Take 1 tablet by mouth daily.  ? naproxen  sodium (ALEVE) 220 MG tablet Take 220 mg by mouth daily.  ? ondansetron (ZOFRAN-ODT) 4 MG disintegrating tablet Take 1 tablet (4 mg total) by mouth every 8 (eight) hours as needed for nausea or vomiting.  ? pantoprazole (PROTONIX) 40 MG tablet TAKE 1 TABLET 30 MINUTES PRIOR TO BREAKFAST  ? rosuvastatin (CRESTOR) 40 MG tablet TAKE 1 TABLET EVERY DAY  ? Simethicone 125 MG CAPS Take 125 mg by mouth daily as needed (gas).  ? vitamin E 180 MG (400 UNITS) capsule Take 400 Units by mouth daily.  ? [DISCONTINUED] polycarbophil (FIBERCON) 625 MG tablet Take 625 mg by mouth daily as needed. FIBER THERAPY  ? [DISCONTINUED] traMADol (ULTRAM) 50 MG tablet Take 50 mg by mouth every 6 (six) hours as needed.  ? ?No facility-administered encounter medications on file as of 02/04/2022.  ?  ?

## 2022-02-05 ENCOUNTER — Encounter: Payer: Self-pay | Admitting: Gastroenterology

## 2022-02-05 ENCOUNTER — Ambulatory Visit (INDEPENDENT_AMBULATORY_CARE_PROVIDER_SITE_OTHER): Payer: Medicare Other | Admitting: Gastroenterology

## 2022-02-05 ENCOUNTER — Other Ambulatory Visit: Payer: Self-pay

## 2022-02-05 VITALS — BP 146/80 | HR 81 | Temp 99.0°F | Ht 68.0 in | Wt 218.0 lb

## 2022-02-05 DIAGNOSIS — R1310 Dysphagia, unspecified: Secondary | ICD-10-CM | POA: Diagnosis not present

## 2022-02-05 DIAGNOSIS — I2510 Atherosclerotic heart disease of native coronary artery without angina pectoris: Secondary | ICD-10-CM | POA: Diagnosis not present

## 2022-02-05 NOTE — Progress Notes (Signed)
Jonathon Bellows MD, MRCP(U.K) [address]  [place]  [name], [place] [address]  Main: [phone]  Fax: [phone]   Gastroenterology Consultation  Referring Provider:     Owens Loffler, MD Primary Care Physician:  Owens Loffler, MD Primary Gastroenterologist:  Dr. Jonathon Bellows  Reason for Consultation:   Dysphagia         HPI:   Amanda [name] is a 71 y.o. y/o female referred for consultation & management  by Dr. Owens Loffler, MD.     She has been referred to see me for dysphagia.  Back in 2017 she underwent an EGD for dysphagia and was found to have a Schatzki's ring that was dilated to 18 mm. 12/05/2021 hemoglobin 12.2 g  Has been having difficulty with swallowing solids more than liquids for the past 1 year.  Takes Prilosec 40 mg once daily for thing in the morning on an empty stomach.  Feels sometimes solids such as meat get stuck in the center of the chest and does not go down.  Denies any weight loss  Past Medical History:  Diagnosis Date   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 2013: nl LV sys fxn, mild MR, no evidence of pulm htn; b. TTE 8/17: EF 60-65%, no RWMA, nl LV dia fxn,  mild AS, mod AI   CAD (coronary artery disease), native coronary artery 11/26/2020   Controlled type 2 diabetes mellitus with diabetic autonomic neuropathy, without long-term current use of insulin ([facility]) 08/10/2007   Qualifier: Diagnosis of  By: Council Mechanic MD, Hilaria Ota    COPD (chronic obstructive pulmonary disease) ([facility]) 10/30/2016   Depression    GAD (generalized anxiety disorder)    GERD (gastroesophageal reflux disease)    Hiatal hernia with gastroesophageal reflux 1997   Hyperlipidemia    Hypertension    Osteoporosis    Tobacco abuse     Past Surgical History:  Procedure Laterality Date   ABDOMINAL HYSTERECTOMY  1993   ANTERIOR CERVICAL DECOMP/DISCECTOMY FUSION N/A 10/26/2018   Procedure: ACDF C4-C5 C5-C6 C6-C7;  Surgeon: Kary Kos, MD;   Location: [facility];  Service: Neurosurgery;  Laterality: N/A;   APPENDECTOMY     BACK SURGERY     BREAST BIOPSY Right 2007   benign   CHOLECYSTECTOMY     COLONOSCOPY     30 years ago was normal per pt.    DILATION AND CURETTAGE OF UTERUS     ESOPHAGEAL DILATION     x 3   fractured leg Right    INCISION AND DRAINAGE Right 12/02/2021   Procedure: INCISION AND DRAINAGE;  Surgeon: Hessie Knows, MD;  Location: [facility];  Service: Orthopedics;  Laterality: Right;   LEFT HEART CATH AND CORONARY ANGIOGRAPHY N/A 11/11/2020   Procedure: LEFT HEART CATH AND CORONARY ANGIOGRAPHY;  Surgeon: Wellington Hampshire, MD;  Location: [facility];  Service: Cardiovascular;  Laterality: N/A;   OOPHORECTOMY     OVARIAN CYST REMOVAL  1972   SALIVARY GLAND SURGERY     TONSILLECTOMY     4 yoa   TOTAL KNEE ARTHROPLASTY Left 12/24/2015   Procedure: LEFT TOTAL KNEE ARTHROPLASTY;  Surgeon: [name], MD;  Location: [facility];  Service: Orthopedics;  Laterality: Left;   TOTAL KNEE ARTHROPLASTY Right 12/30/2017   Procedure: RIGHT TOTAL KNEE ARTHROPLASTY;  Surgeon: [name], MD;  Location: [facility];  Service: Orthopedics;  Laterality: Right;  WRIST FRACTURE SURGERY  11/2008    Prior to Admission medications   Medication Sig Start Date End Date Taking? Authorizing Provider  acetaminophen (TYLENOL) 650 MG CR tablet Take 650 mg by mouth 3 times/day as needed-between meals & bedtime (for headaches).   Yes [provider]  albuterol (PROVENTIL HFA;VENTOLIN HFA) 108 (90 Base) MCG/ACT inhaler Inhale 2 puffs into the lungs every 4 (four) hours as needed for wheezing or shortness of breath. 07/27/18  Yes Copland, Frederico Hamman, MD  amitriptyline (ELAVIL) 50 MG tablet TAKE 2 TABLETS BY MOUTH DAILY AT BEDTIME. 07/17/21  Yes Copland, Frederico Hamman, MD  Ascorbic Acid (VITAMIN C) 1000 MG tablet Take 1,000 mg by mouth daily.   Yes [provider]  aspirin EC 81 MG tablet Take 81 mg by mouth daily.   Yes  [provider]  buPROPion (WELLBUTRIN XL) 150 MG 24 hr tablet TAKE 1 TABLET (150 MG TOTAL) BY MOUTH DAILY. 07/17/21  Yes Copland, Frederico Hamman, MD  calcium carbonate (TUMS - DOSED IN MG ELEMENTAL CALCIUM) 500 MG chewable tablet Chew 2 tablets by mouth daily as needed for indigestion or heartburn.   Yes [provider]  Cholecalciferol (VITAMIN D) 1000 UNITS capsule Take 1,000 Units by mouth daily.    Yes [provider]  clonazePAM (KLONOPIN) 0.5 MG tablet Take 1 tablet by mouth twice daily as needed 12/12/21  Yes Copland, Frederico Hamman, MD  cyclobenzaprine (FLEXERIL) 10 MG tablet TAKE 1 TABLET THREE TIMES DAILY AS NEEDED FOR MUSCLE SPAMS 12/17/21  Yes Copland, Frederico Hamman, MD  dimenhyDRINATE (DRAMAMINE) 50 MG tablet Take 50 mg by mouth every 8 (eight) hours as needed.   Yes [provider]  ezetimibe (ZETIA) 10 MG tablet Take 1 tablet (10 mg total) by mouth daily. 11/08/20  Yes Wellington Hampshire, MD  FLUoxetine (PROZAC) 40 MG capsule Take 2 capsules (80 mg total) by mouth daily. 01/15/22  Yes Copland, Frederico Hamman, MD  gabapentin (NEURONTIN) 100 MG capsule Take 1 capsule (100 mg total) by mouth at bedtime. 01/15/22  Yes Copland, Frederico Hamman, MD  guaiFENesin (MUCINEX) 600 MG 12 hr tablet Take 600 mg by mouth daily.   Yes [provider]  metoprolol tartrate (LOPRESSOR) 25 MG tablet TAKE 1/2 TABLET TWICE DAILY 11/24/21  Yes Wellington Hampshire, MD  Multiple Vitamin (MULTIVITAMIN) tablet Take 1 tablet by mouth daily.   Yes [provider]  naproxen sodium (ALEVE) 220 MG tablet Take 220 mg by mouth daily.   Yes [provider]  ondansetron (ZOFRAN-ODT) 4 MG disintegrating tablet Take 1 tablet (4 mg total) by mouth every 8 (eight) hours as needed for nausea or vomiting. 12/20/21  Yes Menshew, Dannielle Karvonen, PA-C  pantoprazole (PROTONIX) 40 MG tablet TAKE 1 TABLET 30 MINUTES PRIOR TO BREAKFAST 11/12/21  Yes Copland, Frederico Hamman, MD  rosuvastatin (CRESTOR) 40 MG tablet TAKE 1 TABLET  EVERY DAY 09/24/21  Yes Copland, Frederico Hamman, MD  Simethicone 125 MG CAPS Take 125 mg by mouth daily as needed (gas).   Yes [provider]  vitamin E 180 MG (400 UNITS) capsule Take 400 Units by mouth daily.   Yes [provider]    Family History  Problem Relation Age of Onset   Hyperlipidemia Mother    Arthritis Mother    Diabetes Mother    Heart disease Mother    Colon polyps Mother    Cancer Paternal Grandmother        breast   Hypercholesterolemia Sister    Colon polyps Sister  Breast cancer Sister    Colon cancer Neg Hx    Esophageal cancer Neg Hx    Rectal cancer Neg Hx    Stomach cancer Neg Hx    Pancreatic cancer Neg Hx      Social History   Tobacco Use   Smoking status: Every Day    Packs/day: 0.75    Years: 45.00    Pack years: 33.75    Types: Cigarettes   Smokeless tobacco: Never   Tobacco comments:    Smoking half PPD but is trying to quit.   Vaping Use   Vaping Use: Never used  Substance Use Topics   Alcohol use: Yes    Alcohol/week: 0.0 standard drinks    Comment: glass of wine twice a month    Drug use: No    Allergies as of 02/05/2022 - Review Complete 02/05/2022  Allergen Reaction Noted   Honey bee venom [bee venom] Anaphylaxis 11/10/2020   Diazepam Other (See Comments) 08/09/2007    Review of Systems:    All systems reviewed and negative except where noted in HPI.   Physical Exam:  BP (!) 146/80    Pulse 81    Temp 99 F (37.2 C) (Oral)    Ht 5\' 8"  (1.727 m)    Wt 218 lb (98.9 kg)    BMI 33.15 kg/m  No LMP recorded. Patient has had a hysterectomy. Psych:  Alert and cooperative. Normal mood and affect. General:   Alert,  Well-developed, well-nourished, pleasant and cooperative in NAD Head:  Normocephalic and atraumatic. Eyes:  Sclera clear, no icterus.   Conjunctiva pink. Ears:  Normal auditory acuity. Lungs:  Respirations even and unlabored.  Clear throughout to auscultation.   No wheezes, crackles, or rhonchi. No  acute distress. Heart:  Regular rate and rhythm; no murmurs, clicks, rubs, or gallops. Abdomen:  Normal bowel sounds.  No bruits.  Soft, non-tender and non-distended without masses, hepatosplenomegaly or hernias noted.  No guarding or rebound tenderness.    Msk:  Symmetrical without gross deformities. Good, equal movement & strength bilaterally. Psych:  Alert and cooperative. Normal mood and affect.  Imaging Studies: No results found.  Assessment and Plan:   Amanda [name] is a 71 y.o. y/o female has been referred for dysphagia.  Prior history of Schatzki's ring dilated in 2017.  History suggestive of recurrence.  Already takes PPI in the morning.    Plan 1.  EGD with possible dilation tomorrow.  We will also rule out eosinophilic esophagitis.   I have discussed alternative options, risks & benefits,  which include, but are not limited to, bleeding, infection, perforation,respiratory complication & drug reaction.  The patient agrees with this plan & written consent will be obtained.     Follow up in as needed  Dr Jonathon Bellows MD,MRCP(U.K)

## 2022-02-06 ENCOUNTER — Ambulatory Visit
Admission: RE | Admit: 2022-02-06 | Discharge: 2022-02-06 | Disposition: A | Discharge status: Home / Self Care | Payer: Medicare Other | Attending: Gastroenterology | Admitting: Gastroenterology

## 2022-02-06 ENCOUNTER — Encounter: Payer: Self-pay | Admitting: Gastroenterology

## 2022-02-06 ENCOUNTER — Ambulatory Visit: Payer: Medicare Other | Admitting: Anesthesiology

## 2022-02-06 ENCOUNTER — Encounter
Admission: RE | Disposition: A | Discharge status: Home / Self Care | Payer: Self-pay | Source: Home / Self Care | Attending: Gastroenterology

## 2022-02-06 ENCOUNTER — Other Ambulatory Visit: Payer: Self-pay

## 2022-02-06 DIAGNOSIS — M810 Age-related osteoporosis without current pathological fracture: Secondary | ICD-10-CM | POA: Diagnosis not present

## 2022-02-06 DIAGNOSIS — F32A Depression, unspecified: Secondary | ICD-10-CM | POA: Diagnosis not present

## 2022-02-06 DIAGNOSIS — K219 Gastro-esophageal reflux disease without esophagitis: Secondary | ICD-10-CM | POA: Insufficient documentation

## 2022-02-06 DIAGNOSIS — I2510 Atherosclerotic heart disease of native coronary artery without angina pectoris: Secondary | ICD-10-CM | POA: Insufficient documentation

## 2022-02-06 DIAGNOSIS — Z6834 Body mass index (BMI) 34.0-34.9, adult: Secondary | ICD-10-CM | POA: Diagnosis not present

## 2022-02-06 DIAGNOSIS — F419 Anxiety disorder, unspecified: Secondary | ICD-10-CM | POA: Insufficient documentation

## 2022-02-06 DIAGNOSIS — I10 Essential (primary) hypertension: Secondary | ICD-10-CM | POA: Insufficient documentation

## 2022-02-06 DIAGNOSIS — R1310 Dysphagia, unspecified: Secondary | ICD-10-CM | POA: Diagnosis not present

## 2022-02-06 DIAGNOSIS — K222 Esophageal obstruction: Secondary | ICD-10-CM | POA: Diagnosis not present

## 2022-02-06 DIAGNOSIS — E669 Obesity, unspecified: Secondary | ICD-10-CM | POA: Diagnosis not present

## 2022-02-06 DIAGNOSIS — J449 Chronic obstructive pulmonary disease, unspecified: Secondary | ICD-10-CM | POA: Diagnosis not present

## 2022-02-06 DIAGNOSIS — E1140 Type 2 diabetes mellitus with diabetic neuropathy, unspecified: Secondary | ICD-10-CM | POA: Diagnosis not present

## 2022-02-06 DIAGNOSIS — E785 Hyperlipidemia, unspecified: Secondary | ICD-10-CM | POA: Diagnosis not present

## 2022-02-06 HISTORY — PX: ESOPHAGOGASTRODUODENOSCOPY (EGD) WITH PROPOFOL: SHX5813

## 2022-02-06 SURGERY — ESOPHAGOGASTRODUODENOSCOPY (EGD) WITH PROPOFOL
Anesthesia: General

## 2022-02-06 MED ORDER — PROPOFOL 10 MG/ML IV BOLUS
INTRAVENOUS | Status: DC | PRN
  Administered 2022-02-06: 70 mg via INTRAVENOUS

## 2022-02-06 MED ORDER — PROPOFOL 10 MG/ML IV BOLUS
INTRAVENOUS | Status: AC
  Filled 2022-02-06: qty 20

## 2022-02-06 MED ORDER — PROPOFOL 500 MG/50ML IV EMUL
INTRAVENOUS | Status: DC | PRN
  Administered 2022-02-06: 125 ug/kg/min via INTRAVENOUS

## 2022-02-06 MED ORDER — LIDOCAINE HCL (CARDIAC) PF 100 MG/5ML IV SOSY
PREFILLED_SYRINGE | INTRAVENOUS | Status: DC | PRN
Start: 2022-02-06 — End: 2022-02-06
  Administered 2022-02-06: 50 mg via INTRAVENOUS

## 2022-02-06 MED ORDER — LIDOCAINE HCL (PF) 2 % IJ SOLN
INTRAMUSCULAR | Status: AC
  Filled 2022-02-06: qty 5

## 2022-02-06 MED ORDER — SODIUM CHLORIDE 0.9 % IV SOLN: INTRAVENOUS | Status: DC

## 2022-02-06 NOTE — H&P (Signed)
Jonathon Bellows, MD [address], [place], [place], [place], [address] [address], [place], [place], [place], [address] Phone: [phone]  Fax: [phone]  Primary Care Physician:  Owens Loffler, MD   Pre-Procedure History & Physical: HPI:  MAZELLA [name] is a 71 y.o. 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 2013: nl LV sys fxn, mild MR, no evidence of pulm htn; b. TTE 8/17: EF 60-65%, no RWMA, nl LV dia fxn,  mild AS, mod AI   CAD (coronary artery disease), native coronary artery 11/26/2020   Controlled type 2 diabetes mellitus with diabetic autonomic neuropathy, without long-term current use of insulin ([place]) 08/10/2007   Qualifier: Diagnosis of  By: Council Mechanic MD, Hilaria Ota    COPD (chronic obstructive pulmonary disease) ([place]) 10/30/2016   Depression    GAD (generalized anxiety disorder)    GERD (gastroesophageal reflux disease)    Hiatal hernia with gastroesophageal reflux 1997   Hyperlipidemia    Hypertension    Osteoporosis    Tobacco abuse     Past Surgical History:  Procedure Laterality Date   ABDOMINAL HYSTERECTOMY  1993   ANTERIOR CERVICAL DECOMP/DISCECTOMY FUSION N/A 10/26/2018   Procedure: ACDF C4-C5 C5-C6 C6-C7;  Surgeon: Kary Kos, MD;  Location: [facility];  Service: Neurosurgery;  Laterality: N/A;   APPENDECTOMY     BACK SURGERY     BREAST BIOPSY Right 2007   benign   CHOLECYSTECTOMY     COLONOSCOPY     30 years ago was normal per pt.    DILATION AND CURETTAGE OF UTERUS     ESOPHAGEAL DILATION     x 3   fractured leg Right    INCISION AND DRAINAGE Right 12/02/2021   Procedure: INCISION AND DRAINAGE;  Surgeon: Hessie Knows, MD;  Location: [facility];  Service: Orthopedics;  Laterality: Right;   LEFT HEART CATH AND CORONARY ANGIOGRAPHY N/A 11/11/2020   Procedure: LEFT HEART CATH AND CORONARY ANGIOGRAPHY;  Surgeon: Wellington Hampshire, MD;  Location:  [facility];  Service: Cardiovascular;  Laterality: N/A;   OOPHORECTOMY     OVARIAN CYST REMOVAL  1972   SALIVARY GLAND SURGERY     TONSILLECTOMY     4 yoa   TOTAL KNEE ARTHROPLASTY Left 12/24/2015   Procedure: LEFT TOTAL KNEE ARTHROPLASTY;  Surgeon: [name], MD;  Location: [facility];  Service: Orthopedics;  Laterality: Left;   TOTAL KNEE ARTHROPLASTY Right 12/30/2017   Procedure: RIGHT TOTAL KNEE ARTHROPLASTY;  Surgeon: [name], MD;  Location: [facility];  Service: Orthopedics;  Laterality: Right;   WRIST FRACTURE SURGERY  11/2008    Prior to Admission medications   Medication Sig Start Date End Date Taking? Authorizing Provider  acetaminophen (TYLENOL) 650 MG CR tablet Take 650 mg by mouth 3 times/day as needed-between meals & bedtime (for headaches).   Yes [provider]  Ascorbic Acid (VITAMIN C) 1000 MG tablet Take 1,000 mg by mouth daily.   Yes [provider]  aspirin EC 81 MG tablet Take 81 mg by mouth daily.   Yes [provider]  buPROPion (WELLBUTRIN XL) 150 MG 24 hr tablet TAKE 1 TABLET (150 MG TOTAL) BY MOUTH DAILY. 07/17/21  Yes Copland, Frederico Hamman, MD  Cholecalciferol (VITAMIN D) 1000 UNITS capsule Take 1,000 Units by mouth daily.  Yes [provider]  cyclobenzaprine (FLEXERIL) 10 MG tablet TAKE 1 TABLET THREE TIMES DAILY AS NEEDED FOR MUSCLE SPAMS 12/17/21  Yes Copland, Frederico Hamman, MD  FLUoxetine (PROZAC) 40 MG capsule Take 2 capsules (80 mg total) by mouth daily. 01/15/22  Yes Copland, Frederico Hamman, MD  metoprolol tartrate (LOPRESSOR) 25 MG tablet TAKE 1/2 TABLET TWICE DAILY 11/24/21  Yes Wellington Hampshire, MD  Multiple Vitamin (MULTIVITAMIN) tablet Take 1 tablet by mouth daily.   Yes [provider]  rosuvastatin (CRESTOR) 40 MG tablet TAKE 1 TABLET EVERY DAY 09/24/21  Yes Copland, Frederico Hamman, MD  albuterol (PROVENTIL HFA;VENTOLIN HFA) 108 (90 Base) MCG/ACT inhaler Inhale 2 puffs into the lungs every 4 (four) hours as  needed for wheezing or shortness of breath. 07/27/18   Copland, Frederico Hamman, MD  amitriptyline (ELAVIL) 50 MG tablet TAKE 2 TABLETS BY MOUTH DAILY AT BEDTIME. 07/17/21   Copland, Frederico Hamman, MD  calcium carbonate (TUMS - DOSED IN MG ELEMENTAL CALCIUM) 500 MG chewable tablet Chew 2 tablets by mouth daily as needed for indigestion or heartburn.    [provider]  clonazePAM (KLONOPIN) 0.5 MG tablet Take 1 tablet by mouth twice daily as needed 12/12/21   Copland, Frederico Hamman, MD  dimenhyDRINATE (DRAMAMINE) 50 MG tablet Take 50 mg by mouth every 8 (eight) hours as needed.    [provider]  ezetimibe (ZETIA) 10 MG tablet Take 1 tablet (10 mg total) by mouth daily. 11/08/20   Wellington Hampshire, MD  gabapentin (NEURONTIN) 100 MG capsule Take 1 capsule (100 mg total) by mouth at bedtime. 01/15/22   Copland, Frederico Hamman, MD  guaiFENesin (MUCINEX) 600 MG 12 hr tablet Take 600 mg by mouth daily.    [provider]  naproxen sodium (ALEVE) 220 MG tablet Take 220 mg by mouth daily.    [provider]  ondansetron (ZOFRAN-ODT) 4 MG disintegrating tablet Take 1 tablet (4 mg total) by mouth every 8 (eight) hours as needed for nausea or vomiting. 12/20/21   Menshew, Dannielle Karvonen, PA-C  pantoprazole (PROTONIX) 40 MG tablet TAKE 1 TABLET 30 MINUTES PRIOR TO BREAKFAST 11/12/21   Copland, Frederico Hamman, MD  Simethicone 125 MG CAPS Take 125 mg by mouth daily as needed (gas).    [provider]  vitamin E 180 MG (400 UNITS) capsule Take 400 Units by mouth daily.    [provider]    Allergies as of 02/05/2022 - Review Complete 02/05/2022  Allergen Reaction Noted   Honey bee venom [bee venom] Anaphylaxis 11/10/2020   Diazepam Other (See Comments) 08/09/2007    Family History  Problem Relation Age of Onset   Hyperlipidemia Mother    Arthritis Mother    Diabetes Mother    Heart disease Mother    Colon polyps Mother    Cancer Paternal Grandmother        breast   Hypercholesterolemia  Sister    Colon polyps Sister    Breast cancer Sister    Colon cancer Neg Hx    Esophageal cancer Neg Hx    Rectal cancer Neg Hx    Stomach cancer Neg Hx    Pancreatic cancer Neg Hx     Social History   Socioeconomic History   Marital status: Married    Spouse name: Not on file   Number of children: 2   Years of education: Not on file   Highest education level: Not on file  Occupational History   Occupation: Chief Executive Officer:  [employer]  Tobacco Use   Smoking status: Every Day    Packs/day: 0.75    Years: 45.00    Pack years: 33.75    Types: Cigarettes   Smokeless tobacco: Never   Tobacco comments:    Smoking half PPD but is trying to quit.   Vaping Use   Vaping Use: Never used  Substance and Sexual Activity   Alcohol use: Yes    Alcohol/week: 0.0 standard drinks    Comment: glass of wine twice a month    Drug use: No   Sexual activity: Not Currently  Other Topics Concern   Not on file  Social History Narrative   Not on file   Social Determinants of Health   Financial Resource Strain: Not on file  Food Insecurity: Not on file  Transportation Needs: Not on file  Physical Activity: Not on file  Stress: Not on file  Social Connections: Not on file  Intimate Partner Violence: Not on file    Review of Systems: See HPI, otherwise negative ROS  Physical Exam: BP (!) 122/59    Pulse 82    Temp (!) 96.8 F (36 C) (Temporal)    Resp 16    Ht 5\' 7"  (1.702 m)    Wt 100.7 kg    SpO2 97%    BMI 34.77 kg/m  General:   Alert,  pleasant and cooperative in NAD Head:  Normocephalic and atraumatic. Neck:  Supple; no masses or thyromegaly. Lungs:  Clear throughout to auscultation, normal respiratory effort.    Heart:  +S1, +S2, Regular rate and rhythm, No edema. Abdomen:  Soft, nontender and nondistended. Normal bowel sounds, without guarding, and without rebound.   Neurologic:  Alert and  oriented x4;  grossly normal  neurologically.  Impression/Plan: Amanda [name] is here for an endoscopy  to be performed for  evaluation of dysphagia    Risks, benefits, limitations, and alternatives regarding endoscopy have been reviewed with the patient.  Questions have been answered.  All parties agreeable.   Jonathon Bellows, MD  02/06/2022, 11:09 AM

## 2022-02-06 NOTE — Anesthesia Postprocedure Evaluation (Signed)
Anesthesia Post Note ? ?Patient: Amanda [name] ? ?Procedure(s) Performed: ESOPHAGOGASTRODUODENOSCOPY (EGD) WITH PROPOFOL ? ?Patient location during evaluation: Endoscopy ?Anesthesia Type: General ?Level of consciousness: awake and alert ?Pain management: pain level controlled ?Vital Signs Assessment: post-procedure vital signs reviewed and stable ?Respiratory status: spontaneous breathing, nonlabored ventilation and respiratory function stable ?Cardiovascular status: blood pressure returned to baseline and stable ?Postop Assessment: no apparent nausea or vomiting ?Anesthetic complications: no ? ? ?No notable events documented. ? ? ?Last Vitals:  ?Vitals:  ? 02/06/22 1141 02/06/22 1151  ?BP: 92/75 (!) 165/65  ?Pulse: 79 74  ?Resp: 18 17  ?Temp:    ?SpO2: 97% 99%  ?  ?Last Pain:  ?Vitals:  ? 02/06/22 1151  ?TempSrc:   ?PainSc: 0-No pain  ? ? ?  ?  ?  ?  ?  ?  ? ?[place] Ouch ? ? ? ? ?

## 2022-02-06 NOTE — Op Note (Signed)
[facility] ?Gastroenterology ?Patient Name: Amanda [name] ?Procedure Date: 02/06/2022 11:12 AM ?MRN: [id number] ?Account #: [id number] ?Date of Birth: 1951-03-26 ?Admit Type: Outpatient ?Age: 71 ?Room: [facility] ENDO ROOM 4 ?Gender: Female ?Note Status: Finalized ?Instrument Name: Upper Endoscope [id number] ?Procedure:             Upper GI endoscopy ?Indications:           Dysphagia ?Providers:             Jonathon Bellows MD, MD ?Referring MD:          Maud Deed. Copland MD, MD (Referring MD) ?Medicines:             Monitored Anesthesia Care ?Complications:         No immediate complications. ?Procedure:             Pre-Anesthesia Assessment: ?                       - Prior to the procedure, a History and Physical was  ?                       performed, and patient medications, allergies and  ?                       sensitivities were reviewed. The patient's tolerance  ?                       of previous anesthesia was reviewed. ?                       - The risks and benefits of the procedure and the  ?                       sedation options and risks were discussed with the  ?                       patient. All questions were answered and informed  ?                       consent was obtained. ?                       - ASA Grade Assessment: III - A patient with severe  ?                       systemic disease. ?                       After obtaining informed consent, the endoscope was  ?                       passed under direct vision. Throughout the procedure,  ?                       the patient's blood pressure, pulse, and oxygen  ?                       saturations were monitored continuously. The  ?                       Endosonoscope was  introduced through the mouth, and  ?                       advanced to the third part of duodenum. The upper GI  ?                       endoscopy was accomplished with ease. The patient  ?                       tolerated the procedure well. ?Findings: ?     One  benign-appearing, intrinsic moderate (circumferential scarring or  ?     stenosis; an endoscope may pass) stenosis was found at the  ?     gastroesophageal junction. This stenosis measured 1.1 cm (inner  ?     diameter) x less than one cm (in length). The stenosis was traversed. A  ?     TTS dilator was passed through the scope. Dilation with a 12-13.5-15 mm  ?     balloon dilator was performed to 13.5 mm. The dilation site was examined  ?     and showed moderate mucosal disruption. ?     The stomach was normal. ?     The cardia and gastric fundus were normal on retroflexion. ?     The examined duodenum was normal. ?     Normal mucosa was found in the entire esophagus. Biopsies were taken  ?     with a cold forceps for histology. ?Impression:            - Benign-appearing esophageal stenosis. Dilated. ?                       - Normal stomach. ?                       - Normal examined duodenum. ?                       - No specimens collected. ?Recommendation:        - Discharge patient to home (with escort). ?                       - Resume previous diet. ?                       - Use Prilosec (omeprazole) 40 mg PO BID for 8 weeks. ?                       - Repeat upper endoscopy in 2 weeks for retreatment. ?Procedure Code(s):     --- Professional --- ?                       [phone], Esophagogastroduodenoscopy, flexible,  ?                       transoral; with transendoscopic balloon dilation of  ?                       esophagus (less than 30 mm diameter) ?                       [id number], 59, Esophagogastroduodenoscopy, flexible,  ?  transoral; with biopsy, single or multiple ?CPT copyright 2019 American Medical Association. All rights reserved. ?The codes documented in this report are preliminary and upon coder review may  ?be revised to meet current compliance requirements. ?Jonathon Bellows, MD ?Jonathon Bellows MD, MD ?02/06/2022 11:30:18 AM ?This report has been signed electronically. ?Number of Addenda:  0 ?Note Initiated On: 02/06/2022 11:12 AM ?Estimated Blood Loss:  Estimated blood loss: none. ?     [facility] ?

## 2022-02-06 NOTE — Transfer of Care (Signed)
Immediate Anesthesia Transfer of Care Note ? ?Patient: Amanda [name] ? ?Procedure(s) Performed: ESOPHAGOGASTRODUODENOSCOPY (EGD) WITH PROPOFOL ? ?Patient Location: PACU ? ?Anesthesia Type:General ? ?Level of Consciousness: awake and drowsy ? ?Airway & Oxygen Therapy: Patient Spontanous Breathing ? ?Post-op Assessment: Report given to RN and Post -op Vital signs reviewed and stable ? ?Post vital signs: Reviewed and stable ? ?Last Vitals:  ?Vitals Value Taken Time  ?BP 154/65 02/06/22 1132  ?Temp 36.2 ?C 02/06/22 1131  ?Pulse 84 02/06/22 1132  ?Resp 23 02/06/22 1132  ?SpO2 93 % 02/06/22 1132  ? ? ?Last Pain:  ?Vitals:  ? 02/06/22 1131  ?TempSrc: Temporal  ?PainSc:   ?   ? ?  ? ?Complications: No notable events documented. ?

## 2022-02-06 NOTE — Anesthesia Preprocedure Evaluation (Addendum)
Anesthesia Evaluation  ?Patient identified by MRN, date of birth, ID band ?Patient awake ? ? ? ?Reviewed: ?Allergy & Precautions, H&P , NPO status , Patient's Chart, lab work & pertinent test results, reviewed documented beta blocker date and time  ? ?History of Anesthesia Complications ?Negative for: history of anesthetic complications ? ?Airway ?Mallampati: II ? ?TM Distance: >3 FB ?Neck ROM: full ? ? ? Dental ? ?(+) Edentulous Upper, Edentulous Lower ?  ?Pulmonary ?shortness of breath and with exertion, COPD,  COPD inhaler, neg recent URI, Current SmokerPatient did not abstain from smoking.,  ?Fall one week ago with left sided chest wall pain. She has mild pain with deep breath ?  ?Pulmonary exam normal ?breath sounds clear to auscultation ? ? ? ? ? ? Cardiovascular ?Exercise Tolerance: Good ?hypertension, Pt. on home beta blockers ?(-) angina+ CAD  ?(-) Past MI and (-) Cardiac Stents Normal cardiovascular exam+ Valvular Problems/Murmurs AS  ?Rhythm:regular Rate:Normal ? ?1/23 EKG: ?Normal sinus rhythm ?Right bundle branch block ?Abnormal ECG ?  ?Neuro/Psych ?PSYCHIATRIC DISORDERS Anxiety Depression negative neurological ROS ?   ? GI/Hepatic ?Neg liver ROS, GERD  Medicated,  ?Endo/Other  ?diabetes, Type 2 ? Renal/GU ?negative Renal ROS  ?negative genitourinary ?  ?Musculoskeletal ? ? Abdominal ?(+) + obese,   ?Peds ? 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    Comment:  bee stings ?No date: Aortic stenosis ?    Comment:  a. 2013: nl LV sys fxn, mild MR, no evidence of pulm  ?             htn; b. TTE 8/17: EF 60-65%, no RWMA, nl LV dia fxn,   ?             mild AS, mod AI ?11/26/2020: CAD (coronary artery disease), native coronary artery ?08/10/2007: Controlled type 2 diabetes mellitus with diabetic  ?autonomic neuropathy, without long-term current use of insulin ([place]) ?    Comment:  Qualifier:  Diagnosis of  By: Council Mechanic MD, Hilaria Ota  ?10/30/2016: COPD (chronic obstructive pulmonary disease) ([place]) ?No date: Depression ?No date: GAD (generalized anxiety disorder) ?No date: GERD (gastroesophageal reflux disease) ?1997: Hiatal hernia with gastroesophageal reflux ?No date: Hyperlipidemia ?No date: Hypertension ?No date: Osteoporosis ?No date: Tobacco abuse ? ? Reproductive/Obstetrics ?negative OB ROS ? ?  ? ? ? ? ? ? ? ? ? ? ? ? ? ?  ?  ? ? ? ? ? ? ? ?Anesthesia Physical ? ?Anesthesia Plan ? ?ASA: 3 ? ?Anesthesia Plan: General  ? ?Post-op Pain Management:   ? ?Induction: Intravenous ? ?PONV Risk Score and Plan: 2 and TIVA and Treatment may vary due to age or medical condition ? ?Airway Management Planned: Natural Airway and Simple Face Mask ? ?Additional Equipment:  ? ?Intra-op Plan:  ? ?Post-operative Plan:  ? ?Informed Consent: I have reviewed the patients History and Physical, chart, labs and discussed the procedure including the risks, benefits and alternatives for the proposed anesthesia with the patient or authorized representative who has indicated his/her understanding and acceptance.  ? ? ? ?Dental advisory given ? ?Plan Discussed with: Anesthesiologist, CRNA and Surgeon ? ?Anesthesia Plan Comments:   ? ? ? ? ? ?Anesthesia Quick Evaluation ? ?

## 2022-02-09 ENCOUNTER — Encounter: Payer: Self-pay | Admitting: Gastroenterology

## 2022-02-09 LAB — SURGICAL PATHOLOGY

## 2022-02-26 ENCOUNTER — Ambulatory Visit: Payer: Medicare Other | Admitting: Anesthesiology

## 2022-02-26 ENCOUNTER — Ambulatory Visit
Admission: RE | Admit: 2022-02-26 | Discharge: 2022-02-26 | Disposition: A | Discharge status: Home / Self Care | Payer: Medicare Other | Attending: Gastroenterology | Admitting: Gastroenterology

## 2022-02-26 ENCOUNTER — Encounter
Admission: RE | Disposition: A | Discharge status: Home / Self Care | Payer: Self-pay | Source: Home / Self Care | Attending: Gastroenterology

## 2022-02-26 ENCOUNTER — Encounter: Payer: Self-pay | Admitting: Gastroenterology

## 2022-02-26 DIAGNOSIS — R1310 Dysphagia, unspecified: Secondary | ICD-10-CM | POA: Insufficient documentation

## 2022-02-26 DIAGNOSIS — F411 Generalized anxiety disorder: Secondary | ICD-10-CM | POA: Insufficient documentation

## 2022-02-26 DIAGNOSIS — E1143 Type 2 diabetes mellitus with diabetic autonomic (poly)neuropathy: Secondary | ICD-10-CM | POA: Insufficient documentation

## 2022-02-26 DIAGNOSIS — I10 Essential (primary) hypertension: Secondary | ICD-10-CM | POA: Insufficient documentation

## 2022-02-26 DIAGNOSIS — J449 Chronic obstructive pulmonary disease, unspecified: Secondary | ICD-10-CM | POA: Diagnosis not present

## 2022-02-26 DIAGNOSIS — K222 Esophageal obstruction: Secondary | ICD-10-CM | POA: Diagnosis not present

## 2022-02-26 DIAGNOSIS — E785 Hyperlipidemia, unspecified: Secondary | ICD-10-CM | POA: Insufficient documentation

## 2022-02-26 DIAGNOSIS — K219 Gastro-esophageal reflux disease without esophagitis: Secondary | ICD-10-CM | POA: Diagnosis not present

## 2022-02-26 DIAGNOSIS — Z79899 Other long term (current) drug therapy: Secondary | ICD-10-CM | POA: Insufficient documentation

## 2022-02-26 DIAGNOSIS — F17210 Nicotine dependence, cigarettes, uncomplicated: Secondary | ICD-10-CM | POA: Diagnosis not present

## 2022-02-26 DIAGNOSIS — R0602 Shortness of breath: Secondary | ICD-10-CM | POA: Diagnosis not present

## 2022-02-26 HISTORY — DX: Other injury of unspecified body region, initial encounter: T14.8XXA

## 2022-02-26 HISTORY — PX: ESOPHAGOGASTRODUODENOSCOPY (EGD) WITH PROPOFOL: SHX5813

## 2022-02-26 LAB — GLUCOSE, CAPILLARY: Glucose-Capillary: 140 mg/dL — ABNORMAL HIGH (ref 70–99)

## 2022-02-26 SURGERY — ESOPHAGOGASTRODUODENOSCOPY (EGD) WITH PROPOFOL
Anesthesia: General

## 2022-02-26 MED ORDER — PHENYLEPHRINE 40 MCG/ML (10ML) SYRINGE FOR IV PUSH (FOR BLOOD PRESSURE SUPPORT)
PREFILLED_SYRINGE | INTRAVENOUS | Status: DC | PRN
  Administered 2022-02-26: 40 ug via INTRAVENOUS

## 2022-02-26 MED ORDER — PROPOFOL 10 MG/ML IV BOLUS
INTRAVENOUS | Status: DC | PRN
  Administered 2022-02-26: 10 mg via INTRAVENOUS
  Administered 2022-02-26: 60 mg via INTRAVENOUS

## 2022-02-26 MED ORDER — SODIUM CHLORIDE 0.9 % IV SOLN: INTRAVENOUS | Status: DC

## 2022-02-26 MED ORDER — LIDOCAINE HCL (CARDIAC) PF 100 MG/5ML IV SOSY
PREFILLED_SYRINGE | INTRAVENOUS | Status: DC | PRN
  Administered 2022-02-26: 100 mg via INTRAVENOUS

## 2022-02-26 MED ORDER — PROPOFOL 500 MG/50ML IV EMUL
INTRAVENOUS | Status: DC | PRN
  Administered 2022-02-26: 135 ug/kg/min via INTRAVENOUS

## 2022-02-26 MED ORDER — GLYCOPYRROLATE 0.2 MG/ML IJ SOLN
INTRAMUSCULAR | Status: DC | PRN
  Administered 2022-02-26: .2 mg via INTRAVENOUS

## 2022-02-26 NOTE — Transfer of Care (Addendum)
Immediate Anesthesia Transfer of Care Note ? ?Patient: TAMICKA SHIMON ? ?Procedure(s) Performed: ESOPHAGOGASTRODUODENOSCOPY (EGD) WITH PROPOFOL ? ?Patient Location: Endoscopy Unit ? ?Anesthesia Type:General ? ?Level of Consciousness: awake, drowsy and patient cooperative ? ?Airway & Oxygen Therapy: Patient Spontanous Breathing and Patient connected to face mask oxygen ? ?Post-op Assessment: Report given to RN and Post -op Vital signs reviewed and stable ? ?Post vital signs: Reviewed and stable ? ?Last Vitals:  ?Vitals Value Taken Time  ?BP 156/68 02/26/22 0904  ?Temp 36.2 ?C 02/26/22 0903  ?Pulse 74 02/26/22 0905  ?Resp 15 02/26/22 0905  ?SpO2 100 % 02/26/22 0905  ?Vitals shown include unvalidated device data. ? ?Last Pain:  ?Vitals:  ? 02/26/22 0903  ?TempSrc: Temporal  ?PainSc:   ?   ? ?  ? ?Complications: No notable events documented. ?

## 2022-02-26 NOTE — Anesthesia Preprocedure Evaluation (Addendum)
Anesthesia Evaluation  ?Patient identified by MRN, date of birth, ID band ?Patient awake ? ? ? ?Reviewed: ?Allergy & Precautions, H&P , NPO status , Patient's Chart, lab work & pertinent test results, reviewed documented beta blocker date and time  ? ?History of Anesthesia Complications ?Negative for: history of anesthetic complications ? ?Airway ?Mallampati: II ? ?TM Distance: >3 FB ?Neck ROM: full ? ? ? Dental ? ?(+) Edentulous Upper, Edentulous Lower ?  ?Pulmonary ?shortness of breath and with exertion, COPD,  COPD inhaler, neg recent URI, Current SmokerPatient did not abstain from smoking.,  ?  ?Pulmonary exam normal ?breath sounds clear to auscultation ? ? ? ? ? ? Cardiovascular ?Exercise Tolerance: Good ?hypertension, Pt. on home beta blockers ?(-) angina+ CAD  ?(-) Past MI and (-) Cardiac Stents Normal cardiovascular exam+ Valvular Problems/Murmurs AS  ?Rhythm:regular Rate:Normal ? ?1/23 EKG: ?Normal sinus rhythm ?Right bundle branch block ?Abnormal ECG ? ?TTE 2022: ?1. Left ventricular ejection fraction, by estimation, is 65 to 70%. The  ?left ventricle has normal function. The left ventricle has no regional  ?wall motion abnormalities. There is mild left ventricular hypertrophy.  ?Left ventricular diastolic parameters  ?were normal.  ??2. Right ventricular systolic function is normal. The right ventricular  ?size is normal.  ??3. The mitral valve is degenerative. No evidence of mitral valve  ?regurgitation.  ??4. The aortic valve is calcified. Aortic valve regurgitation is mild to  ?moderate. Moderate aortic valve stenosis. Aortic valve mean gradient  ?measures 29.2 mmHg. Aortic valve Vmax measures 3.35 m/s.  ??5. The inferior vena cava is normal in size with greater than 50%  ?respiratory variability, suggesting right atrial pressure of 3 mmHg.  ?  ?Neuro/Psych ?PSYCHIATRIC DISORDERS Anxiety Depression negative neurological ROS ?   ? GI/Hepatic ?Neg liver ROS, GERD   Medicated,  ?Endo/Other  ?diabetes, Type 2 ? Renal/GU ?negative Renal ROS  ?negative genitourinary ?  ?Musculoskeletal ? ? Abdominal ?(+) + obese,   ?Peds ? 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    Comment:  bee stings ?No date: Aortic stenosis ?    Comment:  a. 2013: nl LV sys fxn, mild MR, no evidence of pulm  ?             htn; b. TTE 8/17: EF 60-65%, no RWMA, nl LV dia fxn,   ?             mild AS, mod AI ?11/26/2020: CAD (coronary artery disease), native coronary artery ?08/10/2007: Controlled type 2 diabetes mellitus with diabetic  ?autonomic neuropathy, without long-term current use of insulin ([place]) ?    Comment:  Qualifier: Diagnosis of  By: Council Mechanic MD, Hilaria Ota  ?10/30/2016: COPD (chronic obstructive pulmonary disease) ([place]) ?No date: Depression ?No date: GAD (generalized anxiety disorder) ?No date: GERD (gastroesophageal reflux disease) ?1997: Hiatal hernia with gastroesophageal reflux ?No date: Hyperlipidemia ?No date: Hypertension ?No date: Osteoporosis ?No date: Tobacco abuse ? ? Reproductive/Obstetrics ?negative OB ROS ? ?  ? ? ? ? ? ? ? ? ? ? ? ? ? ?  ?  ? ? ? ? ? ? ? ?Anesthesia Physical ? ?Anesthesia Plan ? ?ASA: 3 ? ?Anesthesia Plan: General  ? ?Post-op Pain Management: Minimal or no pain anticipated  ? ?Induction: Intravenous ? ?PONV Risk Score and Plan: 2 and TIVA and Treatment may vary due to age or medical condition ? ?Airway Management Planned: Natural Airway and  Simple Face Mask ? ?Additional Equipment: None ? ?Intra-op Plan:  ? ?Post-operative Plan:  ? ?Informed Consent: I have reviewed the patients History and Physical, chart, labs and discussed the procedure including the risks, benefits and alternatives for the proposed anesthesia with the patient or authorized representative who has indicated his/her understanding and acceptance.  ? ? ? ?Dental advisory given ? ?Plan Discussed with:  Anesthesiologist, CRNA and Surgeon ? ?Anesthesia Plan Comments: (Discussed risks of anesthesia with patient, including possibility of difficulty with spontaneous ventilation under anesthesia necessitating airway intervention, PONV, and rare risks such as cardiac or respiratory or neurological events, and allergic reactions. Discussed the role of CRNA in patient's perioperative care. Patient understands. ?Patient counseled on benefits of smoking cessation, and increased perioperative risks associated with continued smoking. ?)  ? ? ? ? ? ?Anesthesia Quick Evaluation ? ?

## 2022-02-26 NOTE — Anesthesia Postprocedure Evaluation (Signed)
Anesthesia Post Note ? ?Patient: Amanda [name] ? ?Procedure(s) Performed: ESOPHAGOGASTRODUODENOSCOPY (EGD) WITH PROPOFOL ? ?Patient location during evaluation: Endoscopy ?Anesthesia Type: General ?Level of consciousness: awake and alert ?Pain management: pain level controlled ?Vital Signs Assessment: post-procedure vital signs reviewed and stable ?Respiratory status: spontaneous breathing, nonlabored ventilation, respiratory function stable and patient connected to nasal cannula oxygen ?Cardiovascular status: blood pressure returned to baseline and stable ?Postop Assessment: no apparent nausea or vomiting ?Anesthetic complications: no ? ? ?No notable events documented. ? ? ?Last Vitals:  ?Vitals:  ? 02/26/22 0903 02/26/22 0933  ?BP: (!) 156/68 (!) 140/58  ?Pulse:    ?Resp:    ?Temp: (!) 36.2 ?C   ?SpO2:    ?  ?Last Pain:  ?Vitals:  ? 02/26/22 0923  ?TempSrc:   ?PainSc: 0-No pain  ? ? ?  ?  ?  ?  ?  ?  ? ?Arita Miss ? ? ? ? ?

## 2022-02-26 NOTE — Op Note (Signed)
[facility] ?Gastroenterology ?Patient Name: Amanda [name] ?Procedure Date: 02/26/2022 8:44 AM ?MRN: [id number] ?Account #: [id number] ?Date of Birth: December 03, 1951 ?Admit Type: Outpatient ?Age: 71 ?Room: [facility] ENDO ROOM 3 ?Gender: Female ?Note Status: Finalized ?Instrument Name: Upper Endoscope [id number] ?Procedure:             Upper GI endoscopy ?Indications:           Dysphagia ?Providers:             Jonathon Bellows MD, MD ?Referring MD:          Maud Deed. Copland MD, MD (Referring MD) ?Medicines:             Monitored Anesthesia Care ?Complications:         No immediate complications. ?Procedure:             Pre-Anesthesia Assessment: ?                       - The risks and benefits of the procedure and the  ?                       sedation options and risks were discussed with the  ?                       patient. All questions were answered and informed  ?                       consent was obtained. ?                       - ASA Grade Assessment: II - A patient with mild  ?                       systemic disease. ?                       After obtaining informed consent, the endoscope was  ?                       passed under direct vision. Throughout the procedure,  ?                       the patient's blood pressure, pulse, and oxygen  ?                       saturations were monitored continuously. The  ?                       Endosonoscope was introduced through the mouth, and  ?                       advanced to the third part of duodenum. The upper GI  ?                       endoscopy was accomplished with ease. The patient  ?                       tolerated the procedure well. ?Findings: ?     One benign-appearing, intrinsic moderate (circumferential scarring or  ?     stenosis; an endoscope may pass)  stenosis was found at the  ?     gastroesophageal junction. This stenosis measured 1.4 cm (inner  ?     diameter) x less than one cm (in length). The stenosis was traversed. A  ?     TTS dilator  was passed through the scope. Dilation with a 12-13.5-15 mm  ?     balloon and a 15-16.5-18 mm balloon dilator was performed to 16.5 mm.  ?     The dilation site was examined and showed moderate mucosal disruption. ?     The stomach was normal. ?     The examined duodenum was normal. ?     The cardia and gastric fundus were normal on retroflexion. ?Impression:            - Benign-appearing esophageal stenosis. Dilated. ?                       - Normal stomach. ?                       - Normal examined duodenum. ?                       - No specimens collected. ?Recommendation:        - Discharge patient to home (with escort). ?                       - Resume previous diet. ?                       - Continue present medications. ?                       - Return to my office PRN. ?Procedure Code(s):     --- Professional --- ?                       [phone], Esophagogastroduodenoscopy, flexible,  ?                       transoral; with transendoscopic balloon dilation of  ?                       esophagus (less than 30 mm diameter) ?Diagnosis Code(s):     --- Professional --- ?                       K22.2, Esophageal obstruction ?                       R13.10, Dysphagia, unspecified ?CPT copyright 2019 American Medical Association. All rights reserved. ?The codes documented in this report are preliminary and upon coder review may  ?be revised to meet current compliance requirements. ?Jonathon Bellows, MD ?Jonathon Bellows MD, MD ?02/26/2022 9:03:03 AM ?This report has been signed electronically. ?Number of Addenda: 0 ?Note Initiated On: 02/26/2022 8:44 AM ?Estimated Blood Loss:  Estimated blood loss: none. ?     [facility] ?

## 2022-02-26 NOTE — H&P (Signed)
? ? ? ?Jonathon Bellows, MD ?[address], [place], [place], [place], [address] ?[address] [place], [place], [place], [address] ?Phone: [phone]  ?Fax: [phone] ? ?Primary Care Physician:  Owens Loffler, MD ? ? ?Pre-Procedure History & Physical: ?HPI:  Amanda [name] is a [age] y.o. female is here for an endoscopy  ?  ?Past Medical History:  ?Diagnosis Date  ? 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 sting   ? bee stings  ? Aortic stenosis   ? a. 2013: nl LV sys fxn, mild MR, no evidence of pulm htn; b. TTE 8/17: EF 60-65%, no RWMA, nl LV dia fxn,  mild AS, mod AI  ? CAD (coronary artery disease), native coronary artery 11/26/2020  ? Controlled type 2 diabetes mellitus with diabetic autonomic neuropathy, without long-term current use of insulin ([place]) 08/10/2007  ? Qualifier: Diagnosis of  By: Council Mechanic MD, Hilaria Ota   ? COPD (chronic obstructive pulmonary disease) ([place]) 10/30/2016  ? Depression   ? Fracture in accidental fall   ? GAD (generalized anxiety disorder)   ? GERD (gastroesophageal reflux disease)   ? Hiatal hernia with gastroesophageal reflux 1997  ? Hyperlipidemia   ? Hypertension   ? Osteoporosis   ? Tobacco abuse   ? ? ?Past Surgical History:  ?Procedure Laterality Date  ? ABDOMINAL HYSTERECTOMY  1993  ? ANTERIOR CERVICAL DECOMP/DISCECTOMY FUSION N/A 10/26/2018  ? Procedure: ACDF C4-C5 C5-C6 C6-C7;  Surgeon: Kary Kos, MD;  Location: [facility];  Service: Neurosurgery;  Laterality: N/A;  ? APPENDECTOMY    ? BACK SURGERY    ? BREAST BIOPSY Right 2007  ? benign  ? CHOLECYSTECTOMY    ? COLONOSCOPY    ? 30 years ago was normal per pt.   ? DILATION AND CURETTAGE OF UTERUS    ? ESOPHAGEAL DILATION    ? x 3  ? ESOPHAGOGASTRODUODENOSCOPY (EGD) WITH PROPOFOL N/A 02/06/2022  ? Procedure: ESOPHAGOGASTRODUODENOSCOPY (EGD) WITH PROPOFOL;  Surgeon: Jonathon Bellows, MD;  Location: [facility] ENDOSCOPY;  Service: Gastroenterology;  Laterality: N/A;  ? fractured leg Right   ? INCISION AND DRAINAGE Right 12/02/2021  ? Procedure: INCISION AND  DRAINAGE;  Surgeon: Hessie Knows, MD;  Location: [facility] ORS;  Service: Orthopedics;  Laterality: Right;  ? LEFT HEART CATH AND CORONARY ANGIOGRAPHY N/A 11/11/2020  ? Procedure: LEFT HEART CATH AND CORONARY ANGIOGRAPHY;  Surgeon: Wellington Hampshire, MD;  Location: [facility] CV LAB;  Service: Cardiovascular;  Laterality: N/A;  ? OOPHORECTOMY    ? OVARIAN CYST REMOVAL  1972  ? SALIVARY GLAND SURGERY    ? TONSILLECTOMY    ? 4 yoa  ? TOTAL KNEE ARTHROPLASTY Left 12/24/2015  ? Procedure: LEFT TOTAL KNEE ARTHROPLASTY;  Surgeon: [name], MD;  Location: [facility];  Service: Orthopedics;  Laterality: Left;  ? TOTAL KNEE ARTHROPLASTY Right 12/30/2017  ? Procedure: RIGHT TOTAL KNEE ARTHROPLASTY;  Surgeon: [name], MD;  Location: [facility];  Service: Orthopedics;  Laterality: Right;  ? WRIST FRACTURE SURGERY  11/2008  ? ? ?Prior to Admission medications   ?Medication Sig Start Date End Date Taking? Authorizing Provider  ?albuterol (PROVENTIL HFA;VENTOLIN HFA) 108 (90 Base) MCG/ACT inhaler Inhale 2 puffs into the lungs every 4 (four) hours as needed for wheezing or shortness of breath. 07/27/18  Yes Copland, Frederico Hamman, MD  ?amitriptyline (ELAVIL) 50 MG tablet TAKE 2 TABLETS BY MOUTH DAILY AT BEDTIME. 07/17/21  Yes Copland, Frederico Hamman, MD  ?Ascorbic Acid (VITAMIN C) 1000 MG tablet Take 1,000 mg by mouth  daily.   Yes [provider]  ?buPROPion (WELLBUTRIN XL) 150 MG 24 hr tablet TAKE 1 TABLET (150 MG TOTAL) BY MOUTH DAILY. 07/17/21  Yes Copland, Frederico Hamman, MD  ?clonazePAM (KLONOPIN) 0.5 MG tablet Take 1 tablet by mouth twice daily as needed 12/12/21  Yes Copland, Frederico Hamman, MD  ?cyclobenzaprine (FLEXERIL) 10 MG tablet TAKE 1 TABLET THREE TIMES DAILY AS NEEDED FOR MUSCLE SPAMS 12/17/21  Yes Copland, Frederico Hamman, MD  ?dimenhyDRINATE (DRAMAMINE) 50 MG tablet Take 50 mg by mouth every 8 (eight) hours as needed.   Yes [provider]  ?FLUoxetine (PROZAC) 40 MG capsule Take 2 capsules (80 mg total) by mouth daily. 01/15/22   Yes Copland, Frederico Hamman, MD  ?gabapentin (NEURONTIN) 100 MG capsule Take 1 capsule (100 mg total) by mouth at bedtime. 01/15/22  Yes Copland, Frederico Hamman, MD  ?guaiFENesin (MUCINEX) 600 MG 12 hr tablet Take 600 mg by mouth daily.   Yes [provider]  ?metoprolol tartrate (LOPRESSOR) 25 MG tablet TAKE 1/2 TABLET TWICE DAILY 11/24/21  Yes Wellington Hampshire, MD  ?Multiple Vitamin (MULTIVITAMIN) tablet Take 1 tablet by mouth daily.   Yes [provider]  ?naproxen sodium (ALEVE) 220 MG tablet Take 220 mg by mouth daily.   Yes [provider]  ?ondansetron (ZOFRAN-ODT) 4 MG disintegrating tablet Take 1 tablet (4 mg total) by mouth every 8 (eight) hours as needed for nausea or vomiting. 12/20/21  Yes Menshew, Dannielle Karvonen, PA-C  ?pantoprazole (PROTONIX) 40 MG tablet TAKE 1 TABLET 30 MINUTES PRIOR TO BREAKFAST 11/12/21  Yes Copland, Frederico Hamman, MD  ?rosuvastatin (CRESTOR) 40 MG tablet TAKE 1 TABLET EVERY DAY 09/24/21  Yes Copland, Frederico Hamman, MD  ?Simethicone 125 MG CAPS Take 125 mg by mouth daily as needed (gas).   Yes [provider]  ?acetaminophen (TYLENOL) 650 MG CR tablet Take 650 mg by mouth 3 times/day as needed-between meals & bedtime (for headaches).    [provider]  ?aspirin EC 81 MG tablet Take 81 mg by mouth daily.    [provider]  ?calcium carbonate (TUMS - DOSED IN MG ELEMENTAL CALCIUM) 500 MG chewable tablet Chew 2 tablets by mouth daily as needed for indigestion or heartburn.    [provider]  ?Cholecalciferol (VITAMIN D) 1000 UNITS capsule Take 1,000 Units by mouth daily.     [provider]  ?ezetimibe (ZETIA) 10 MG tablet Take 1 tablet (10 mg total) by mouth daily. 11/08/20   Wellington Hampshire, MD  ?vitamin E 180 MG (400 UNITS) capsule Take 400 Units by mouth daily.    [provider]  ? ? ?Allergies as of 02/06/2022 - Review Complete 02/06/2022  ?Allergen Reaction Noted  ? Honey bee venom [bee venom] Anaphylaxis 11/10/2020  ?  Diazepam Other (See Comments) 08/09/2007  ? ? ?Family History  ?Problem Relation Age of Onset  ? Hyperlipidemia Mother   ? Arthritis Mother   ? Diabetes Mother   ? Heart disease Mother   ? Colon polyps Mother   ? Cancer Paternal Grandmother   ?     breast  ? Hypercholesterolemia Sister   ? Colon polyps Sister   ? Breast cancer Sister   ? Colon cancer Neg Hx   ? Esophageal cancer Neg Hx   ? Rectal cancer Neg Hx   ? Stomach cancer Neg Hx   ? Pancreatic cancer Neg Hx   ? ? ?Social History  ? ?Socioeconomic History  ? Marital status: Married  ?  Spouse name:  Not on file  ? Number of children: 2  ? Years of education: Not on file  ? Highest education level: Not on file  ?Occupational History  ? Occupation: Scientist, forensic  ?  Employer: [employer]  ?Tobacco Use  ? Smoking status: Every Day  ?  Packs/day: 0.75  ?  Years: 45.00  ?  Pack years: 33.75  ?  Types: Cigarettes  ? Smokeless tobacco: Never  ? Tobacco comments:  ?  Smoking half PPD but is trying to quit.   ?Vaping Use  ? Vaping Use: Never used  ?Substance and Sexual Activity  ? Alcohol use: Yes  ?  Alcohol/week: 0.0 standard drinks  ?  Comment: glass of wine twice a month   ? Drug use: No  ? Sexual activity: Not Currently  ?Other Topics Concern  ? Not on file  ?Social History Narrative  ? Not on file  ? ?Social Determinants of Health  ? ?Financial Resource Strain: Not on file  ?Food Insecurity: Not on file  ?Transportation Needs: Not on file  ?Physical Activity: Not on file  ?Stress: Not on file  ?Social Connections: Not on file  ?Intimate Partner Violence: Not on file  ? ? ?Review of Systems: ?See HPI, otherwise negative ROS ? ?Physical Exam: ?BP (!) 157/64   Pulse 81   Temp (!) 97.3 ?F (36.3 ?C) (Temporal)   Resp 20   Ht '5\' 7"'$  (1.702 m)   Wt 100.7 kg   SpO2 99%   BMI 34.77 kg/m?  ?General:   Alert,  pleasant and cooperative in NAD ?Head:  Normocephalic and atraumatic. ?Neck:  Supple; no masses or thyromegaly. ?Lungs:  Clear  throughout to auscultation, normal respiratory effort.    ?Heart:  +S1, +S2, Regular rate and rhythm, No edema. ?Abdomen:  Soft, nontender and nondistended. Normal bowel sounds, without guarding, and without rebound.

## 2022-02-26 NOTE — Anesthesia Procedure Notes (Addendum)
Procedure Name: General with mask airway ?Date/Time: 02/26/2022 8:56 AM ?Performed by: Kelton Pillar, CRNA ?Pre-anesthesia Checklist: Patient identified, Emergency Drugs available, Suction available and Patient being monitored ?Patient Re-evaluated:Patient Re-evaluated prior to induction ?Oxygen Delivery Method: Simple face mask ?Induction Type: IV induction ?Placement Confirmation: positive ETCO2 and CO2 detector ?Dental Injury: Teeth and Oropharynx as per pre-operative assessment  ? ? ? ? ?

## 2022-02-27 ENCOUNTER — Telehealth: Payer: Self-pay | Admitting: Cardiovascular Disease

## 2022-02-27 ENCOUNTER — Encounter: Payer: Self-pay | Admitting: Gastroenterology

## 2022-02-27 MED ORDER — EZETIMIBE 10 MG PO TABS
10.0000 mg | ORAL_TABLET | Freq: Every day | ORAL | 0 refills | Status: DC
Start: 2022-02-27 — End: 2022-06-30

## 2022-02-27 NOTE — Telephone Encounter (Signed)
Requested Prescriptions  ? ?Signed Prescriptions Disp Refills  ? ezetimibe (ZETIA) 10 MG tablet 90 tablet 0  ?  Sig: Take 1 tablet (10 mg total) by mouth daily.  ?  Authorizing Provider: Kathlyn Sacramento A  ?  Ordering User: NEWCOMER MCCLAIN, Caoimhe Damron L  ? ? ?

## 2022-02-27 NOTE — Telephone Encounter (Signed)
?*  STAT* If patient is at the pharmacy, call can be transferred to refill team. ? ? ?1. Which medications need to be refilled? (please list name of each medication and dose if known) Zetia '10mg'$  1 tablet daily ? ?2. Which pharmacy/location (including street and city if local pharmacy) is medication to be sent to? [facility] ? ?3. Do they need a 30 day or 90 day supply? 90 Day supply ?

## 2022-03-30 DIAGNOSIS — Z20822 Contact with and (suspected) exposure to covid-19: Secondary | ICD-10-CM | POA: Diagnosis not present

## 2022-04-13 ENCOUNTER — Telehealth: Payer: Self-pay | Admitting: Family Medicine

## 2022-04-13 NOTE — Telephone Encounter (Signed)
Left a message on voicemail for patient to call the office back. Need more information. ?

## 2022-04-13 NOTE — Telephone Encounter (Signed)
Pt states "the infection from December when she was in the ER, thinks has come back." Pt wants to know if she needs to get that test done from then, wants to speak with the nurse about this.  ? ?Callback Number: [phone] ?

## 2022-04-13 NOTE — Telephone Encounter (Signed)
Please call and triage.  

## 2022-04-14 NOTE — Telephone Encounter (Signed)
Will assess in AM. ?

## 2022-04-14 NOTE — Telephone Encounter (Signed)
I spoke with pt;starting one wk ago pt has had pain in rt hand; on 04/10/22 pt said pain at base of fingers on rt hand started to hurt worse with slight swelling.no redness seen but rt hand feels warmer than lt hand. Pt said has sharp pain base of fingers on rt hand when lifts any thing with weight. Pt said was in hospital last year for hand infection for 2 wks and pt does not want that to happen again. Offered pt an appt today at [facility] but pt prefers to see Dr Lorelei Pont; pt scheduled appt with Dr Lorelei Pont on 04/15/22 at 11 AM. UC & ED precautions given and pt voiced understanding. Sending note to Dr Lorelei Pont and Butch Penny CMA.  ?

## 2022-04-14 NOTE — Telephone Encounter (Signed)
Left v/m requesting pt to call [facility]. 

## 2022-04-15 ENCOUNTER — Ambulatory Visit: Payer: Medicare Other | Admitting: Family Medicine

## 2022-04-15 DIAGNOSIS — Z20822 Contact with and (suspected) exposure to covid-19: Secondary | ICD-10-CM | POA: Diagnosis not present

## 2022-04-20 ENCOUNTER — Encounter: Payer: Self-pay | Admitting: Family Medicine

## 2022-04-20 ENCOUNTER — Ambulatory Visit (INDEPENDENT_AMBULATORY_CARE_PROVIDER_SITE_OTHER): Payer: Medicare Other | Admitting: Family Medicine

## 2022-04-20 VITALS — BP 130/66 | HR 97 | Temp 98.5°F | Ht 67.0 in | Wt 220.2 lb

## 2022-04-20 DIAGNOSIS — M79641 Pain in right hand: Secondary | ICD-10-CM

## 2022-04-20 DIAGNOSIS — M7989 Other specified soft tissue disorders: Secondary | ICD-10-CM | POA: Diagnosis not present

## 2022-04-20 DIAGNOSIS — M79643 Pain in unspecified hand: Secondary | ICD-10-CM | POA: Diagnosis not present

## 2022-04-20 DIAGNOSIS — I2510 Atherosclerotic heart disease of native coronary artery without angina pectoris: Secondary | ICD-10-CM | POA: Diagnosis not present

## 2022-04-20 LAB — CBC WITH DIFFERENTIAL/PLATELET
Basophils Absolute: 0 10*3/uL (ref 0.0–0.1)
Basophils Relative: 0.7 % (ref 0.0–3.0)
Eosinophils Absolute: 0.2 10*3/uL (ref 0.0–0.7)
Eosinophils Relative: 3.2 % (ref 0.0–5.0)
HCT: 37.5 % (ref 36.0–46.0)
Hemoglobin: 12.7 g/dL (ref 12.0–15.0)
Lymphocytes Relative: 22.8 % (ref 12.0–46.0)
Lymphs Abs: 1.4 10*3/uL (ref 0.7–4.0)
MCHC: 33.7 g/dL (ref 30.0–36.0)
MCV: 99.9 fl (ref 78.0–100.0)
Monocytes Absolute: 0.5 10*3/uL (ref 0.1–1.0)
Monocytes Relative: 8 % (ref 3.0–12.0)
Neutro Abs: 3.9 10*3/uL (ref 1.4–7.7)
Neutrophils Relative %: 65.3 % (ref 43.0–77.0)
Platelets: 172 10*3/uL (ref 150.0–400.0)
RBC: 3.76 Mil/uL — ABNORMAL LOW (ref 3.87–5.11)
RDW: 11.7 % (ref 11.5–15.5)
WBC: 6 10*3/uL (ref 4.0–10.5)

## 2022-04-20 LAB — SEDIMENTATION RATE: Sed Rate: 13 mm/hr (ref 0–30)

## 2022-04-20 MED ORDER — AMOXICILLIN-POT CLAVULANATE 875-125 MG PO TABS: 1.0000 | ORAL_TABLET | Freq: Two times a day (BID) | ORAL | 0 refills | Status: DC

## 2022-04-20 NOTE — Progress Notes (Signed)
? ? ?Amanda [name] T. Amanda [name], Amanda [name], Amanda [name] ?Therapist, music at [facility] ?[place] ?[place], [address] ? ?Phone: [phone]  FAX: [phone] ? ?Amanda [name] - 71 y.o. female  MRN [id number]  Date of Birth: Sep 26, 1951 ? ?Date: 04/20/2022  PCP: Amanda [name], Amanda [name]  Referral: Amanda [name], Amanda [name] ? ?Chief Complaint  ?Patient presents with  ? Hand Pain  ?  Right  ? Cat Scratches  ?  On Right Leg and Face  ? ?Subjective:  ? ?Amanda [name] is a 71 y.o. very pleasant female patient with Body mass index is 34.49 kg/m?. who presents with the following: ? ?She is a very nice well-known patient, and history is significant for right-sided dorsal forearm abscess that required operative intervention as well as prolonged course of antibiotics.  This was a abscess from Pasteurella with a prior cat bite.  She was initially hospitalized from December 21 to November 29, 2021, and subsequently went to the ED again on December 25 and ultimately went to the OR for debridement of their abscess. ? ?Initially at her first hospitalization she was given IV Zosyn and was discharged on p.o. Augmentin.  After her operative debridement she was given IV Unasyn and sent home on Augmentin for 2 weeks. ? ?Today, she presents with multiple cat scratches on her right face and right arm.  She has had some pain in the hand, but no significant pain in the wrist and near her area of prior operative I&D and debridement. ? ?She presents today with her daughter who provides additional history.  On the dorsum of the wrist there is no significant swelling or redness.  No significant swelling or redness about the hand, but she does have some difficulty making composite fist and flexing at the fourth digit. ?\ ? ? ?Review of Systems is noted in the HPI, as appropriate ? ?Objective:  ? ?BP 130/66   Pulse 97   Temp 98.5 ?F (36.9 ?C) (Oral)   Ht '5\' 7"'$  (1.702 m)   Wt 220 lb 3 oz (99.9 kg)   SpO2 96%   BMI 34.49  kg/m?  ? ?GEN: No acute distress; alert,appropriate. ?PULM: Breathing comfortably in no respiratory distress ?PSYCH: Normally interactive.  ? ?There is some tenderness along the flexor aspect of the fourth digit and down into the MCP joint on the flexor surface.  She is having some difficulty closing the fourth digit. ? ?The wrist and the forearm is nontender and she flex and extends the wrist approaching full or that of the contralateral side. ? ?She does have diffuse osteoarthritic changes throughout both hands. ? ? ? ? ? ? ?Laboratory and Imaging Data: ?Results for orders placed or performed in visit on 04/20/22  ?CBC with Differential/Platelet  ?Result Value Ref Range  ? WBC 6.0 4.0 - 10.5 K/uL  ? RBC 3.76 (L) 3.87 - 5.11 Mil/uL  ? Hemoglobin 12.7 12.0 - 15.0 g/dL  ? HCT 37.5 36.0 - 46.0 %  ? MCV 99.9 78.0 - 100.0 fl  ? MCHC 33.7 30.0 - 36.0 g/dL  ? RDW 11.7 11.5 - 15.5 %  ? Platelets 172.0 150.0 - 400.0 K/uL  ? Neutrophils Relative % 65.3 43.0 - 77.0 %  ? Lymphocytes Relative 22.8 12.0 - 46.0 %  ? Monocytes Relative 8.0 3.0 - 12.0 %  ? Eosinophils Relative 3.2 0.0 - 5.0 %  ? Basophils Relative 0.7 0.0 - 3.0 %  ? Neutro Abs 3.9 1.4 - 7.7 K/uL  ?  Lymphs Abs 1.4 0.7 - 4.0 K/uL  ? Monocytes Absolute 0.5 0.1 - 1.0 K/uL  ? Eosinophils Absolute 0.2 0.0 - 0.7 K/uL  ? Basophils Absolute 0.0 0.0 - 0.1 K/uL  ?Sedimentation rate  ?Result Value Ref Range  ? Sed Rate 13 0 - 30 mm/hr  ?  ? ?Assessment and Plan:  ? ?  ICD-10-CM   ?1. Right hand pain  M79.641 CBC with Differential/Platelet  ?  Sedimentation rate  ?  ?2. Intermittent pain and swelling of hand  M79.643 CBC with Differential/Platelet  ? M79.89 Sedimentation rate  ?  ? ?Right hand pain and localized pain at the fourth digit on the flexor tendon surface. ? ?With a normal CBC and sed rate, this is very reassuring.  This very well may all be some tenosynovitis and arthritic flare. ? ?Nevertheless a tendon sheath and hand infection is potentially catastrophic.  In all  cases, with even the slightest concern, it is my clinical practice to start the patient on antibiotics.  We will start her on p.o. Augmentin. ? ?I was given her strict precautions and her daughter strict precautions. ? ?Medication Management during today's office visit: ?Meds ordered this encounter  ?Medications  ? amoxicillin-clavulanate (AUGMENTIN) 875-125 MG tablet  ?  Sig: Take 1 tablet by mouth 2 (two) times daily.  ?  Dispense:  20 tablet  ?  Refill:  0  ? ?There are no discontinued medications. ? ?Orders placed today for conditions managed today: ?Orders Placed This Encounter  ?Procedures  ? CBC with Differential/Platelet  ? Sedimentation rate  ? ? ?Follow-up if needed: No follow-ups on file. ? ?Dragon [employer] One speech-to-text software was used for transcription in this dictation.  Possible transcriptional errors can occur using Editor, commissioning.  ? ?Signed, ? ?Gabriel Paulding T. Lakeysha Slutsky, Amanda [name] ? ? ?Outpatient Encounter Medications as of 04/20/2022  ?Medication Sig  ? acetaminophen (TYLENOL) 650 MG CR tablet Take 650 mg by mouth 3 times/day as needed-between meals & bedtime (for headaches).  ? albuterol (PROVENTIL HFA;VENTOLIN HFA) 108 (90 Base) MCG/ACT inhaler Inhale 2 puffs into the lungs every 4 (four) hours as needed for wheezing or shortness of breath.  ? amitriptyline (ELAVIL) 50 MG tablet TAKE 2 TABLETS BY MOUTH DAILY AT BEDTIME.  ? amoxicillin-clavulanate (AUGMENTIN) 875-125 MG tablet Take 1 tablet by mouth 2 (two) times daily.  ? Ascorbic Acid (VITAMIN C) 1000 MG tablet Take 1,000 mg by mouth daily.  ? aspirin EC 81 MG tablet Take 81 mg by mouth daily.  ? buPROPion (WELLBUTRIN XL) 150 MG 24 hr tablet TAKE 1 TABLET (150 MG TOTAL) BY MOUTH DAILY.  ? calcium carbonate (TUMS - DOSED IN MG ELEMENTAL CALCIUM) 500 MG chewable tablet Chew 2 tablets by mouth daily as needed for indigestion or heartburn.  ? Cholecalciferol (VITAMIN D) 1000 UNITS capsule Take 1,000 Units by mouth daily.   ? clonazePAM (KLONOPIN) 0.5 MG  tablet Take 1 tablet by mouth twice daily as needed  ? cyclobenzaprine (FLEXERIL) 10 MG tablet TAKE 1 TABLET THREE TIMES DAILY AS NEEDED FOR MUSCLE SPAMS  ? dimenhyDRINATE (DRAMAMINE) 50 MG tablet Take 50 mg by mouth every 8 (eight) hours as needed.  ? ezetimibe (ZETIA) 10 MG tablet Take 1 tablet (10 mg total) by mouth daily.  ? FLUoxetine (PROZAC) 40 MG capsule Take 2 capsules (80 mg total) by mouth daily.  ? gabapentin (NEURONTIN) 100 MG capsule Take 1 capsule (100 mg total) by mouth at bedtime.  ? guaiFENesin (MUCINEX) 600 MG  12 hr tablet Take 600 mg by mouth daily.  ? metoprolol tartrate (LOPRESSOR) 25 MG tablet TAKE 1/2 TABLET TWICE DAILY  ? Multiple Vitamin (MULTIVITAMIN) tablet Take 1 tablet by mouth daily.  ? naproxen sodium (ALEVE) 220 MG tablet Take 220 mg by mouth daily.  ? ondansetron (ZOFRAN-ODT) 4 MG disintegrating tablet Take 1 tablet (4 mg total) by mouth every 8 (eight) hours as needed for nausea or vomiting.  ? pantoprazole (PROTONIX) 40 MG tablet TAKE 1 TABLET 30 MINUTES PRIOR TO BREAKFAST  ? rosuvastatin (CRESTOR) 40 MG tablet TAKE 1 TABLET EVERY DAY  ? Simethicone 125 MG CAPS Take 125 mg by mouth daily as needed (gas).  ? vitamin E 180 MG (400 UNITS) capsule Take 400 Units by mouth daily.  ? ?No facility-administered encounter medications on file as of 04/20/2022.  ?  ?

## 2022-04-29 ENCOUNTER — Ambulatory Visit (INDEPENDENT_AMBULATORY_CARE_PROVIDER_SITE_OTHER): Payer: Medicare Other | Admitting: Orthopedic Surgery

## 2022-04-29 ENCOUNTER — Ambulatory Visit (INDEPENDENT_AMBULATORY_CARE_PROVIDER_SITE_OTHER): Payer: Medicare Other

## 2022-04-29 DIAGNOSIS — M25562 Pain in left knee: Secondary | ICD-10-CM | POA: Diagnosis not present

## 2022-04-29 DIAGNOSIS — I2510 Atherosclerotic heart disease of native coronary artery without angina pectoris: Secondary | ICD-10-CM

## 2022-04-30 ENCOUNTER — Encounter: Payer: Self-pay | Admitting: Orthopedic Surgery

## 2022-04-30 NOTE — Progress Notes (Signed)
Office Visit Note   Patient: Amanda [name]           Date of Birth: September 12, 1951           MRN: [id number] Visit Date: 04/29/2022 Requested by: Owens Loffler, MD [address] [place],  [place] [address] PCP: Owens Loffler, MD  Subjective: Chief Complaint  Patient presents with   Left Knee - Pain    HPI: Patient presents for evaluation of left knee pain.  Had left knee replacement and right knee replacement about 10 years ago.  Describes some history of falling.  Having some dizziness.  States she is fallen about 8 times this year.  Has motion sickness medication.  She also has a neurologist.  Takes naproxen for pain.  Denies any fevers or chills.              ROS: All systems reviewed are negative as they relate to the chief complaint within the history of present illness.  Patient denies  fevers or chills.   Assessment & Plan: Visit Diagnoses:  1. Left knee pain, unspecified chronicity     Plan: Impression is well-functioning left total knee replacement with no effusion good quad and hamstring strength.  No groin pain.  Radiographs look good.  Components do not look loose and this does not look infected.  I do not think her dizziness symptoms and falling symptoms are related to the left knee.  Recommended neurological evaluation for some of the symptoms but she wants to hold off on that for now.  See her back as needed.  Follow-Up Instructions: Return if symptoms worsen or fail to improve.   Orders:  Orders Placed This Encounter  Procedures   XR KNEE 3 VIEW LEFT   No orders of the defined types were placed in this encounter.     Procedures: No procedures performed   Clinical Data: No additional findings.  Objective: Vital Signs: There were no vitals taken for this visit.  Physical Exam:   Constitutional: Patient appears well-developed HEENT:  Head: Normocephalic Eyes:EOM are normal Neck: Normal range of motion Cardiovascular: Normal  rate Pulmonary/chest: Effort normal Neurologic: Patient is alert Skin: Skin is warm Psychiatric: Patient has normal mood and affect   Ortho Exam: Ortho exam demonstrates excellent range of motion of the left knee from full extension to about 120 of flexion.  Collaterals are stable to varus valgus stress at 0 and 30 degrees.  No warmth or effusion in either knee.  Extensor mechanism intact.  No muscle atrophy left leg versus right leg.  No groin pain with internal/external rotation of the leg on the left-hand side.  Pedal pulses palpable.  Specialty Comments:  No specialty comments available.  Imaging: XR KNEE 3 VIEW LEFT  Result Date: 04/30/2022 AP lateral merchant radiographs left knee reviewed.  No acute fracture.  Total knee replacement in good position alignment with no complicating features.  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    Abscess of wrist 11/26/2021   CAD (coronary artery disease), native coronary artery 11/26/2020   ACS (acute coronary syndrome) ([place]) 11/11/2020   Nonrheumatic aortic valve stenosis    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 sting    COPD with chronic bronchitis ([place]) 10/30/2016   Gastroesophageal reflux disease 09/17/2016   Smoker 05/10/2015   Solitary pulmonary nodule 03/28/2013   Essential hypertension    Vitamin D deficiency 05/29/2009   Pure  hypercholesterolemia 12/07/2007   Major depressive disorder, recurrent episode, in partial remission ([place]) 12/07/2007   Controlled type 2 diabetes mellitus with diabetic autonomic neuropathy, without long-term current use of insulin ([place]) 08/10/2007   Past Medical History:  Diagnosis Date   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 2013: nl LV sys fxn, mild MR, no evidence of pulm htn; b. TTE 8/17: EF 60-65%, no RWMA, nl LV dia fxn,  mild AS, mod AI   CAD (coronary artery disease), native coronary artery 11/26/2020   Controlled type 2  diabetes mellitus with diabetic autonomic neuropathy, without long-term current use of insulin ([place]) 08/10/2007   Qualifier: Diagnosis of  By: Council Mechanic MD, Hilaria Ota    COPD (chronic obstructive pulmonary disease) ([place]) 10/30/2016   Depression    Fracture in accidental fall    GAD (generalized anxiety disorder)    GERD (gastroesophageal reflux disease)    Hiatal hernia with gastroesophageal reflux 1997   Hyperlipidemia    Hypertension    Osteoporosis    Tobacco abuse     Family History  Problem Relation Age of Onset   Hyperlipidemia Mother    Arthritis Mother    Diabetes Mother    Heart disease Mother    Colon polyps Mother    Cancer Paternal Grandmother        breast   Hypercholesterolemia Sister    Colon polyps Sister    Breast cancer Sister    Colon cancer Neg Hx    Esophageal cancer Neg Hx    Rectal cancer Neg Hx    Stomach cancer Neg Hx    Pancreatic cancer Neg Hx     Past Surgical History:  Procedure Laterality Date   ABDOMINAL HYSTERECTOMY  1993   ANTERIOR CERVICAL DECOMP/DISCECTOMY FUSION N/A 10/26/2018   Procedure: ACDF C4-C5 C5-C6 C6-C7;  Surgeon: Kary Kos, MD;  Location: [facility];  Service: Neurosurgery;  Laterality: N/A;   APPENDECTOMY     BACK SURGERY     BREAST BIOPSY Right 2007   benign   CHOLECYSTECTOMY     COLONOSCOPY     30 years ago was normal per pt.    DILATION AND CURETTAGE OF UTERUS     ESOPHAGEAL DILATION     x 3   ESOPHAGOGASTRODUODENOSCOPY (EGD) WITH PROPOFOL N/A 02/06/2022   Procedure: ESOPHAGOGASTRODUODENOSCOPY (EGD) WITH PROPOFOL;  Surgeon: Jonathon Bellows, MD;  Location: [facility];  Service: Gastroenterology;  Laterality: N/A;   ESOPHAGOGASTRODUODENOSCOPY (EGD) WITH PROPOFOL N/A 02/26/2022   Procedure: ESOPHAGOGASTRODUODENOSCOPY (EGD) WITH PROPOFOL;  Surgeon: Jonathon Bellows, MD;  Location: [facility];  Service: Gastroenterology;  Laterality: N/A;  EGD+dilation per Dr. Vicente Males   fractured leg Right    INCISION AND DRAINAGE Right 12/02/2021    Procedure: INCISION AND DRAINAGE;  Surgeon: Hessie Knows, MD;  Location: [facility];  Service: Orthopedics;  Laterality: Right;   LEFT HEART CATH AND CORONARY ANGIOGRAPHY N/A 11/11/2020   Procedure: LEFT HEART CATH AND CORONARY ANGIOGRAPHY;  Surgeon: Wellington Hampshire, MD;  Location: [facility];  Service: Cardiovascular;  Laterality: N/A;   OOPHORECTOMY     OVARIAN CYST REMOVAL  1972   SALIVARY GLAND SURGERY     TONSILLECTOMY     4 yoa   TOTAL KNEE ARTHROPLASTY Left 12/24/2015   Procedure: LEFT TOTAL KNEE ARTHROPLASTY;  Surgeon: [name], MD;  Location: [facility];  Service: Orthopedics;  Laterality: Left;   TOTAL KNEE ARTHROPLASTY Right 12/30/2017  Procedure: RIGHT TOTAL KNEE ARTHROPLASTY;  Surgeon: [name], MD;  Location: [facility];  Service: Orthopedics;  Laterality: Right;   WRIST FRACTURE SURGERY  11/2008   Social History   Occupational History   Occupation: Chief Executive Officer: [employer]  Tobacco Use   Smoking status: Every Day    Packs/day: 0.75    Years: 45.00    Pack years: 33.75    Types: Cigarettes   Smokeless tobacco: Never   Tobacco comments:    Smoking half PPD but is trying to quit.   Vaping Use   Vaping Use: Never used  Substance and Sexual Activity   Alcohol use: Yes    Alcohol/week: 0.0 standard drinks    Comment: glass of wine twice a month    Drug use: No   Sexual activity: Not Currently

## 2022-06-03 ENCOUNTER — Ambulatory Visit: Payer: Medicare Other | Admitting: Podiatry

## 2022-06-27 ENCOUNTER — Other Ambulatory Visit: Payer: Self-pay | Admitting: Cardiovascular Disease

## 2022-06-27 ENCOUNTER — Other Ambulatory Visit: Payer: Self-pay | Admitting: Family Medicine

## 2022-06-28 NOTE — Telephone Encounter (Signed)
Last office visit 04/20/22 for hand pain and cat scratches.  Last refilled 12/12/21 for #30 with 5 refills.  Next Appt: 08/12/22 for 6 month follow up.

## 2022-06-29 NOTE — Telephone Encounter (Signed)
Good Morning. Could you please schedule a 6 month follow up for this patient? This patient was last seen by Dr. Fletcher Anon  on 01-01-22. Thank you so much.

## 2022-07-01 ENCOUNTER — Ambulatory Visit (INDEPENDENT_AMBULATORY_CARE_PROVIDER_SITE_OTHER): Payer: Medicare Other | Admitting: Podiatry

## 2022-07-01 DIAGNOSIS — M79676 Pain in unspecified toe(s): Secondary | ICD-10-CM

## 2022-07-01 DIAGNOSIS — E1142 Type 2 diabetes mellitus with diabetic polyneuropathy: Secondary | ICD-10-CM | POA: Diagnosis not present

## 2022-07-01 DIAGNOSIS — B351 Tinea unguium: Secondary | ICD-10-CM | POA: Diagnosis not present

## 2022-07-01 NOTE — Progress Notes (Signed)
She presents today chief complaint of painfully elongated toenails 1 through 5 bilaterally.  Objective: Toenails are long thick yellow dystrophic and mycotic pulses are palpable.  Neurologic sensorium is unchanged.  Assessment: Pain in limb secondary to onychomycosis.  Plan: Treatment of mycotic nails 1 through 5 bilateral.

## 2022-07-05 NOTE — Progress Notes (Unsigned)
Cardiology Office Note    Date:  07/07/2022   ID:  Amanda [name], DOB September 09, 1951, MRN [id number]  PCP:  Owens Loffler, MD  Cardiologist:  Kathlyn Sacramento, MD  Electrophysiologist:  None   Chief Complaint: Follow-up  History of Present Illness:   Amanda [name] is a 71 y.o. female with history of nonobstructive CAD, moderate aortic valve stenosis, diet-controlled diabetes, COPD, HTN, HLD, and ongoing tobacco use who presents for follow-up of her CAD and aortic stenosis.  Coronary CTA in 08/2018 showed a calcium score of 521 with moderate nonobstructive disease involving the LAD and RCA that were not significant by CT FFR.  Echo in 08/2019 demonstrated an EF of 60 to 65% with moderate aortic stenosis with a mean gradient of 19 mmHg and a valve area of 1.28 cm.  Repeat echo in 11/2020 showed an EF of 60 to 40%, grade 1 diastolic dysfunction, and moderate aortic stenosis with a mean gradient of 22 mmHg and a valve area of 1.29 cm.  She was admitted to the hospital in 11/2020 with chest pain and worsening exertional dyspnea.  R/LHC showed mild nonobstructive CAD with RHC showing normal filling pressures, normal pulmonary pressure, and normal cardiac output.  Aortic stenosis was moderate with a mean gradient of 24 mmHg and a valve area of 1.44 cm.  Carotid artery ultrasound in 06/2021 showed 1 to 39% bilateral ICA stenoses with antegrade flow of the bilateral vertebral arteries and normal flow hemodynamics of the bilateral subclavian arteries.  She fell in 07/2021 leading to 2 broken ribs.  She was admitted in 11/2021 with right hand cellulitis/abscess due to an infected cat bite.  Echo during that admission demonstrated an EF of 65 to 70%, no regional wall motion abnormalities, mild LVH, normal LV diastolic function parameters, normal RV systolic function and ventricular cavity size, mild to moderate aortic valve insufficiency, and moderate aortic valve stenosis with a mean gradient of 29.2 mmHg.   She was most recently seen in the office in 12/2021 and was noted to have had a fall, without syncope, prior to that visit with some facial contusion.  She was without symptoms of angina or decompensation.  She continued to smoke 1/2 pack/day.  Since we last saw her, she suffered a mechanical fall leading to a fractured nasal bone and fractured pinky.  She comes in doing reasonably well from a cardiac perspective and is currently without symptoms of angina or decompensation.  She does mention that she forgot to tell us at her last visit that she had 2 episodes of chest discomfort that improved with her husbands SL NTG.  Since she was last seen in our office she had 1 further episode of this chest discomfort that improved with SL NTG.  She has not had any further chest discomfort for several months.  She also notes a 60-monthhistory of lower extremity heaviness when ambulating to and from the mailbox that improves with rest.  No nonhealing wounds along the lower extremities.  He is without symptoms of presyncope or syncope.  She continues to smoke a half to three-quarter pack per day.  She has been under increased stress with the recent health of her husband.   Labs independently reviewed: 04/2022 - HGB 12.7, PLT 172 11/2021 - potassium 4.1, BUN 7, SCr 0.72, albumin 3.0, AST/ALT normal, A1c 6.4 06/2021 - TC 129, TG 74, HDL 54, LDL 60 05/2017 - TSH normal   Past Medical History:  Diagnosis Date   Allergy  to bee sting    bee stings   Aortic stenosis    a. 2013: nl LV sys fxn, mild MR, no evidence of pulm htn; b. TTE 8/17: EF 60-65%, no RWMA, nl LV dia fxn,  mild AS, mod AI   CAD (coronary artery disease), native coronary artery 11/26/2020   Controlled type 2 diabetes mellitus with diabetic autonomic neuropathy, without long-term current use of insulin ([place]) 08/10/2007   Qualifier: Diagnosis of  By: Council Mechanic MD, Hilaria Ota    COPD (chronic obstructive pulmonary disease) ([place]) 10/30/2016   Depression     Fracture in accidental fall    GAD (generalized anxiety disorder)    GERD (gastroesophageal reflux disease)    Hiatal hernia with gastroesophageal reflux 1997   Hyperlipidemia    Hypertension    Osteoporosis    Tobacco abuse     Past Surgical History:  Procedure Laterality Date   ABDOMINAL HYSTERECTOMY  1993   ANTERIOR CERVICAL DECOMP/DISCECTOMY FUSION N/A 10/26/2018   Procedure: ACDF C4-C5 C5-C6 C6-C7;  Surgeon: Kary Kos, MD;  Location: [facility];  Service: Neurosurgery;  Laterality: N/A;   APPENDECTOMY     BACK SURGERY     BREAST BIOPSY Right 2007   benign   CHOLECYSTECTOMY     COLONOSCOPY     30 years ago was normal per pt.    DILATION AND CURETTAGE OF UTERUS     ESOPHAGEAL DILATION     x 3   ESOPHAGOGASTRODUODENOSCOPY (EGD) WITH PROPOFOL N/A 02/06/2022   Procedure: ESOPHAGOGASTRODUODENOSCOPY (EGD) WITH PROPOFOL;  Surgeon: Jonathon Bellows, MD;  Location: [facility] ENDOSCOPY;  Service: Gastroenterology;  Laterality: N/A;   ESOPHAGOGASTRODUODENOSCOPY (EGD) WITH PROPOFOL N/A 02/26/2022   Procedure: ESOPHAGOGASTRODUODENOSCOPY (EGD) WITH PROPOFOL;  Surgeon: Jonathon Bellows, MD;  Location: [facility] ENDOSCOPY;  Service: Gastroenterology;  Laterality: N/A;  EGD+dilation per Dr. Vicente Males   fractured leg Right    INCISION AND DRAINAGE Right 12/02/2021   Procedure: INCISION AND DRAINAGE;  Surgeon: Hessie Knows, MD;  Location: [facility];  Service: Orthopedics;  Laterality: Right;   LEFT HEART CATH AND CORONARY ANGIOGRAPHY N/A 11/11/2020   Procedure: LEFT HEART CATH AND CORONARY ANGIOGRAPHY;  Surgeon: Wellington Hampshire, MD;  Location: [facility];  Service: Cardiovascular;  Laterality: N/A;   OOPHORECTOMY     OVARIAN CYST REMOVAL  1972   SALIVARY GLAND SURGERY     TONSILLECTOMY     4 yoa   TOTAL KNEE ARTHROPLASTY Left 12/24/2015   Procedure: LEFT TOTAL KNEE ARTHROPLASTY;  Surgeon: [name], MD;  Location: [facility];  Service: Orthopedics;  Laterality: Left;   TOTAL KNEE ARTHROPLASTY Right  12/30/2017   Procedure: RIGHT TOTAL KNEE ARTHROPLASTY;  Surgeon: [name], MD;  Location: [facility];  Service: Orthopedics;  Laterality: Right;   WRIST FRACTURE SURGERY  11/2008    Current Medications: Current Meds  Medication Sig   acetaminophen (TYLENOL) 650 MG CR tablet Take 650 mg by mouth 3 times/day as needed-between meals & bedtime (for headaches).   albuterol (PROVENTIL HFA;VENTOLIN HFA) 108 (90 Base) MCG/ACT inhaler Inhale 2 puffs into the lungs every 4 (four) hours as needed for wheezing or shortness of breath.   amitriptyline (ELAVIL) 50 MG tablet TAKE 2 TABLETS BY MOUTH DAILY AT BEDTIME.   Ascorbic Acid (VITAMIN C) 1000 MG tablet Take 1,000 mg by mouth daily.   aspirin EC 81 MG tablet Take 81 mg by mouth daily.   buPROPion (WELLBUTRIN XL) 150 MG 24 hr tablet TAKE 1 TABLET (150  MG TOTAL) BY MOUTH DAILY.   calcium carbonate (TUMS - DOSED IN MG ELEMENTAL CALCIUM) 500 MG chewable tablet Chew 2 tablets by mouth daily as needed for indigestion or heartburn.   Cholecalciferol (VITAMIN D) 1000 UNITS capsule Take 1,000 Units by mouth daily.    clonazePAM (KLONOPIN) 0.5 MG tablet Take 1 tablet by mouth twice daily as needed   cyclobenzaprine (FLEXERIL) 10 MG tablet TAKE 1 TABLET THREE TIMES DAILY AS NEEDED FOR MUSCLE SPAMS   dimenhyDRINATE (DRAMAMINE) 50 MG tablet Take 50 mg by mouth every 8 (eight) hours as needed.   ezetimibe (ZETIA) 10 MG tablet TAKE 1 TABLET EVERY DAY   FLUoxetine (PROZAC) 40 MG capsule Take 2 capsules (80 mg total) by mouth daily.   gabapentin (NEURONTIN) 100 MG capsule Take 1 capsule (100 mg total) by mouth at bedtime.   guaiFENesin (MUCINEX) 600 MG 12 hr tablet Take 600 mg by mouth daily.   metoprolol tartrate (LOPRESSOR) 25 MG tablet TAKE 1/2 TABLET TWICE DAILY   Multiple Vitamin (MULTIVITAMIN) tablet Take 1 tablet by mouth daily.   naproxen sodium (ALEVE) 220 MG tablet Take 220 mg by mouth daily.   ondansetron (ZOFRAN-ODT) 4 MG disintegrating tablet Take 1  tablet (4 mg total) by mouth every 8 (eight) hours as needed for nausea or vomiting.   pantoprazole (PROTONIX) 40 MG tablet TAKE 1 TABLET 30 MINUTES PRIOR TO BREAKFAST   rosuvastatin (CRESTOR) 40 MG tablet TAKE 1 TABLET EVERY DAY   Simethicone 125 MG CAPS Take 125 mg by mouth daily as needed (gas).   vitamin E 180 MG (400 UNITS) capsule Take 400 Units by mouth daily.    Allergies:   Honey bee venom [bee venom] and Diazepam   Social History   Socioeconomic History   Marital status: Married    Spouse name: Not on file   Number of children: 2   Years of education: Not on file   Highest education level: Not on file  Occupational History   Occupation: [employer]    Employer: [employer]  Tobacco Use   Smoking status: Every Day    Packs/day: 0.75    Years: 45.00    Total pack years: 33.75    Types: Cigarettes   Smokeless tobacco: Never   Tobacco comments:    Smoking half PPD but is trying to quit.   Vaping Use   Vaping Use: Never used  Substance and Sexual Activity   Alcohol use: Yes    Alcohol/week: 0.0 standard drinks of alcohol    Comment: glass of wine twice a month    Drug use: No   Sexual activity: Not Currently  Other Topics Concern   Not on file  Social History Narrative   Not on file   Social Determinants of Health   Financial Resource Strain: Low Risk  (04/08/2020)   Overall Financial Resource Strain (CARDIA)    Difficulty of Paying Living Expenses: Not very hard  Food Insecurity: Not on file  Transportation Needs: Not on file  Physical Activity: Not on file  Stress: Not on file  Social Connections: Not on file     Family History:  The patient's family history includes Arthritis in her mother; Breast cancer in her sister; Cancer in her paternal grandmother; Colon polyps in her mother and sister; Diabetes in her mother; Heart disease in her mother; Hypercholesterolemia in her sister; Hyperlipidemia in her mother. There is no  history of Colon cancer, Esophageal cancer, Rectal cancer, Stomach cancer,  or Pancreatic cancer.  ROS:   12-point review of systems is negative unless otherwise noted in the HPI.   EKGs/Labs/Other Studies Reviewed:    Studies reviewed were summarized above. The additional studies were reviewed today:  2D echo 12/02/2021: 1. Left ventricular ejection fraction, by estimation, is 65 to 70%. The  left ventricle has normal function. The left ventricle has no regional  wall motion abnormalities. There is mild left ventricular hypertrophy.  Left ventricular diastolic parameters  were normal.   2. Right ventricular systolic function is normal. The right ventricular  size is normal.   3. The mitral valve is degenerative. No evidence of mitral valve  regurgitation.   4. The aortic valve is calcified. Aortic valve regurgitation is mild to  moderate. Moderate aortic valve stenosis. Aortic valve mean gradient  measures 29.2 mmHg. Aortic valve Vmax measures 3.35 m/s.   5. The inferior vena cava is normal in size with greater than 50%  respiratory variability, suggesting right atrial pressure of 3 mmHg __________  Carotid artery ultrasound 07/04/2021: Summary:  Right Carotid: Velocities in the right ICA are consistent with a 1-39%  stenosis. Non-hemodynamically significant plaque <50% noted in the  CCA. The ECA appears <50% stenosed.   Left Carotid: Velocities in the left ICA are consistent with a 1-39%  stenosis. Non-hemodynamically significant plaque <50% noted in the CCA. The ECA appears <50% stenosed.   Vertebrals:  Bilateral vertebral arteries demonstrate antegrade flow.  Subclavians: Normal flow hemodynamics were seen in bilateral subclavian arteries. __________  LHC 11/11/2020: 1.  Mild nonobstructive coronary artery disease.  Tortuous vessels overall suggestive of chronic hypertension. 2.  Left ventricular angiography was not performed.  EF was normal by echo. 3.  Right heart  catheterization showed normal filling pressures, normal pulmonary pressure and normal cardiac output. 4.  Moderate aortic stenosis with mean gradient of 24 mmHg and valve area of 1.44 cm.   Recommendations: No culprit is identified for the patient's chest pain.  Recommend medical therapy for nonobstructive coronary artery disease. Continue to monitor aortic stenosis with echocardiograms. The patient can be discharged home from a cardiac standpoint.  No other issues. __________  2D echo 12/2/201.  1. Left ventricular ejection fraction, by estimation, is 60 to 65%. The  left ventricle has normal function. The left ventricle has no regional  wall motion abnormalities. There is mild to moderate left ventricular  hypertrophy. Left ventricular diastolic  parameters are consistent with Grade I diastolic dysfunction (impaired  relaxation). The average left ventricular global longitudinal strain is  -15.3 %. The global longitudinal strain is abnormal.   2. Right ventricular systolic function is normal. The right ventricular  size is normal.   3. Left atrial size was mildly dilated.   4. The aortic valve is moderately calcified. Aortic valve regurgitation  is mild. Moderate aortic valve stenosis. Aortic valve area, by VTI  measures 1.29 cm. Aortic valve mean gradient measures 22.2 mmHg. Aortic  valve Vmax measures 3.34 m/s. __________  2D echo 08/23/2019: 1. The left ventricle has normal systolic function with an ejection  fraction of 60-65%. The cavity size was normal. There is mild concentric  left ventricular hypertrophy. Left ventricular diastolic Doppler  parameters are consistent with impaired  relaxation.   2. The right ventricle has normal systolic function. The cavity was  normal. There is No increase in right ventricular wall thickness.   3. Left atrial size was normal.   4. Right atrial size was normal.   5. The  mitral valve is abnormal. mild calcification of the mitral valve   leaflet.   6. The aortic valve is abnormal. Moderate sclerosis of the aortic valve.  Aortic valve regurgitation is mild by color flow Doppler. Mild-moderate  stenosis of the aortic valve. DVI 0.36   7. The aorta is normal unless otherwise noted.   8. The pulmonic valve was not well visualized. Pulmonic valve  regurgitation was not assessed by color flow Doppler. __________  Coronary CTA 10/05/2018: FINDINGS: Non-cardiac: See separate report from [facility]. No significant findings on limited lung and soft tissue windows.   Calcium score: Dense calcium noted in LAD and RCA   Coronary Arteries: Right dominant with no anomalies   LM: Distal LM less than 50% calcified stenosis   LAD: Ostial and mid LAD less than 50% calcific stenosis   D1: Small vessel less than 30% mixed plaque   D2: Large branching vessel 50% calcific plaque proximally   Circumflex: Less than 50% calcified plaque at ostium   OM1: Small branch without significant disease   RCA: 50% or less calcific plaque in proximal mid and distal vessel   PDA: Less than 30% calcific plaque   PLA: Less than 30% calcific plaque   IMPRESSION: 1. Calcific aortic atherosclerosis moderate Normal aortic root 2.9 cm 2.  Calcified Aortic Valve suggest TTE correlation for AS 3.  Calcium score 521 which is 77 th percentile for age and sex 30. Moderate non obstructive appearing disease primarily in RCA and LAD as well as D2 Study will be sent for FFR CT __________  2D echo 08/17/2018: - Left ventricle: The cavity size was at the upper limits of    normal. Wall thickness was increased in a pattern of mild LVH.    Systolic function was normal. The estimated ejection fraction was    in the range of 60% to 65%. Wall motion was normal; there were no    regional wall motion abnormalities. Doppler parameters are    consistent with abnormal left ventricular relaxation (grade 1    diastolic dysfunction).  - Aortic valve:  Transvalvular velocity was increased. There was    mild to moderate stenosis. There was mild to moderate    regurgitation. Mean gradient (S): 19 mm Hg. Valve area (VTI):    1.28 cm^2.  - Mitral valve: Calcified annulus.  - Left atrium: The atrium was mildly dilated.  - Right ventricle: The cavity size was normal. Systolic function    was normal. ___________  2D echo 08/05/2016: - Left ventricle: The cavity size was normal. There was mild focal    basal hypertrophy of the septum. Systolic function was normal.    The estimated ejection fraction was in the range of 60% to 65%.    Wall motion was normal; there were no regional wall motion    abnormalities. Left ventricular diastolic function parameters    were normal for the patient&'s age.  - Aortic valve: Calcified annulus. Probably trileaflet. There was    mild stenosis, based on peak velocity and mean gradient. There    was moderate regurgitation. __________  2D echo 03/17/2012: - Left ventricle: The cavity size was normal. There was mild    to moderate concentric hypertrophy. Systolic function was    normal. The estimated ejection fraction was in the range    of 55% to 60%. Wall motion was normal; there were no    regional wall motion abnormalities. Doppler parameters are    consistent with abnormal left  ventricular relaxation    (grade 1 diastolic dysfunction).  - Aortic valve: Mild regurgitation.  - Mitral valve: Mild regurgitation.  - Right ventricle: Systolic function was normal.  - Pulmonary arteries: Systolic pressure was within the    normal range.   EKG:  EKG is ordered today.  The EKG ordered today demonstrates NSR, 87 bpm, RBBB  Recent Labs: 11/29/2021: ALT 40 12/05/2021: BUN 7; Creatinine, Ser 0.72; Potassium 4.1; Sodium 139 04/20/2022: Hemoglobin 12.7; Platelets 172.0  Recent Lipid Panel    Component Value Date/Time   CHOL 129 06/23/2021 1306   CHOL 201 (H) 09/18/2013 1016   TRIG 74.0 06/23/2021 1306   HDL  54.50 06/23/2021 1306   HDL 53 09/18/2013 1016   CHOLHDL 2 06/23/2021 1306   VLDL 14.8 06/23/2021 1306   LDLCALC 60 06/23/2021 1306   LDLCALC 130 (H) 09/18/2013 1016   LDLDIRECT 209.2 05/28/2009 0909    PHYSICAL EXAM:    VS:  BP 134/62 (BP Location: Left Arm, Patient Position: Sitting, Cuff Size: Normal)   Pulse 87   Ht '5\' 7"'$  (1.702 m)   Wt 215 lb (97.5 kg)   SpO2 98%   BMI 33.67 kg/m   BMI: Body mass index is 33.67 kg/m.  Physical Exam Vitals reviewed.  Constitutional:      Appearance: She is well-developed.  HENT:     Head: Normocephalic and atraumatic.  Eyes:     General:        Right eye: No discharge.        Left eye: No discharge.  Neck:     Vascular: No JVD.  Cardiovascular:     Rate and Rhythm: Normal rate and regular rhythm.     Pulses:          Dorsalis pedis pulses are 1+ on the right side and 2+ on the left side.       Posterior tibial pulses are 1+ on the right side and 2+ on the left side.     Heart sounds: S1 normal and S2 normal. Heart sounds not distant. No midsystolic click and no opening snap. Murmur heard.     Harsh midsystolic murmur is present with a grade of 2/6 at the upper right sternal border radiating to the neck.     No friction rub.  Pulmonary:     Effort: Pulmonary effort is normal. No respiratory distress.     Breath sounds: Normal breath sounds. No decreased breath sounds, wheezing or rales.  Chest:     Chest wall: No tenderness.  Abdominal:     General: There is no distension.  Musculoskeletal:     Cervical back: Normal range of motion.     Right lower leg: No edema.     Left lower leg: No edema.  Skin:    General: Skin is warm and dry.     Nails: There is no clubbing.  Neurological:     Mental Status: She is alert and oriented to person, place, and time.  Psychiatric:        Speech: Speech normal.        Behavior: Behavior normal.        Thought Content: Thought content normal.        Judgment: Judgment normal.     Wt  Readings from Last 3 Encounters:  07/07/22 215 lb (97.5 kg)  04/20/22 220 lb 3 oz (99.9 kg)  02/26/22 222 lb (100.7 kg)     ASSESSMENT & PLAN:   Nonobstructive  CAD with chest pain at moderate risk for cardiac etiology: Currently chest pain-free.  She forgot to mention at her last visit with [place] she had 2 episodes of chest discomfort that improved with SL NTG.  Since she was last seen, she has had 1 further episode that improved with SL NTG, though none in several months.  Schedule Lexiscan MPI to evaluate for high risk ischemia.  Otherwise, continue aggressive risk factor modification and current medical therapy including aspirin, ezetimibe, metoprolol, and rosuvastatin.  Aortic valve stenosis: Moderate on most recent echo from 11/2021 with a slight increase in mean gradient.  Follow-up echo in 11/2022.  Lower extremity claudication: Schedule ABIs and lower extremity arterial ultrasound.  Aggressive risk factor modification, including complete smoking cessation, is encouraged.  She remains on aspirin, ezetimibe, and rosuvastatin as outlined below.  HLD: LDL 60 in 06/2021.  She remains on ezetimibe and rosuvastatin.  Tobacco use: Complete smoking cessation is encouraged.  Bilateral carotid bruits: Carotid Doppler in 06/2021 showed mild nonobstructive disease of less than 40% bilaterally.    Disposition: F/u with Dr. Fletcher Anon or an APP in 12/2022.   Medication Adjustments/Labs and Tests Ordered: Current medicines are reviewed at length with the patient today.  Concerns regarding medicines are outlined above. Medication changes, Labs and Tests ordered today are summarized above and listed in the Patient Instructions accessible in Encounters.   Signed, Christell Faith, PA-C 07/07/2022 4:59 PM     [address], [place] [address] [phone]

## 2022-07-07 ENCOUNTER — Encounter: Payer: Self-pay | Admitting: Physician Assistant

## 2022-07-07 ENCOUNTER — Ambulatory Visit (INDEPENDENT_AMBULATORY_CARE_PROVIDER_SITE_OTHER): Payer: Medicare Other | Admitting: Physician Assistant

## 2022-07-07 VITALS — BP 134/62 | HR 87 | Ht 67.0 in | Wt 215.0 lb

## 2022-07-07 DIAGNOSIS — I2510 Atherosclerotic heart disease of native coronary artery without angina pectoris: Secondary | ICD-10-CM | POA: Diagnosis not present

## 2022-07-07 DIAGNOSIS — E785 Hyperlipidemia, unspecified: Secondary | ICD-10-CM

## 2022-07-07 DIAGNOSIS — R072 Precordial pain: Secondary | ICD-10-CM | POA: Diagnosis not present

## 2022-07-07 DIAGNOSIS — R079 Chest pain, unspecified: Secondary | ICD-10-CM

## 2022-07-07 DIAGNOSIS — I350 Nonrheumatic aortic (valve) stenosis: Secondary | ICD-10-CM | POA: Diagnosis not present

## 2022-07-07 DIAGNOSIS — Z720 Tobacco use: Secondary | ICD-10-CM

## 2022-07-07 DIAGNOSIS — I739 Peripheral vascular disease, unspecified: Secondary | ICD-10-CM | POA: Diagnosis not present

## 2022-07-07 NOTE — Patient Instructions (Addendum)
Medication Instructions:  No changes at this time.   *If you need a refill on your cardiac medications before your next appointment, please call your pharmacy*   Lab Work: None  If you have labs (blood work) drawn today and your tests are completely normal, you will receive your results only by: [place] (if you have MyChart) OR A paper copy in the mail If you have any lab test that is abnormal or we need to change your treatment, we will call you to review the results.   Testing/Procedures: Your physician has requested that you have an echocardiogram in DECEMBER 2023. Echocardiography is a painless test that uses sound waves to create images of your heart. It provides your doctor with information about the size and shape of your heart and how well your heart's chambers and valves are working. This procedure takes approximately one hour. There are no restrictions for this procedure.  [place]  Your caregiver has ordered a Stress Test with nuclear imaging. The purpose of this test is to evaluate the blood supply to your heart muscle. This procedure is referred to as a "Non-Invasive Stress Test." This is because other than having an IV started in your vein, nothing is inserted or "invades" your body. Cardiac stress tests are done to find areas of poor blood flow to the heart by determining the extent of coronary artery disease (CAD). Some patients exercise on a treadmill, which naturally increases the blood flow to your heart, while others who are  unable to walk on a treadmill due to physical limitations have a pharmacologic/chemical stress agent called Lexiscan . This medicine will mimic walking on a treadmill by temporarily increasing your coronary blood flow.   Please note: these test may take anywhere between 2-4 hours to complete  PLEASE REPORT TO [facility] AT THE FIRST DESK WILL DIRECT YOU WHERE TO GO  Date of  Procedure:__________________  Arrival Time for Procedure:_________________________  Instructions regarding medication:   __XX__ : You may take all of your medications with small sip of water.    PLEASE NOTIFY THE OFFICE AT LEAST 38 HOURS IN ADVANCE IF YOU ARE UNABLE TO KEEP YOUR APPOINTMENT.  [phone] AND  PLEASE NOTIFY NUCLEAR MEDICINE AT [facility] AT LEAST 24 HOURS IN ADVANCE IF YOU ARE UNABLE TO KEEP YOUR APPOINTMENT. [phone]  How to prepare for your Myoview test:  Do not eat or drink after midnight No caffeine for 24 hours prior to test No smoking 24 hours prior to test. Your medication may be taken with water.  If your doctor stopped a medication because of this test, do not take that medication. Ladies, please do not wear dresses.  Skirts or pants are appropriate. Please wear a short sleeve shirt. No perfume, cologne or lotion. Wear comfortable walking shoes. No heels!   Your physician has requested that you have an ankle brachial index (ABI). During this test an ultrasound and blood pressure cuff are used to evaluate the arteries that supply the arms and legs with blood. Allow thirty minutes for this exam. There are no restrictions or special instructions.  Your physician has requested that you have a lower extremity arterial exercise duplex. During this test, exercise and ultrasound are used to evaluate arterial blood flow in the legs. Allow one hour for this exam. There are no restrictions or special instructions.   Follow-Up: At [facility], you and your health needs are our priority.  As part of our continuing  mission to provide you with exceptional heart care, we have created designated Provider Care Teams.  These Care Teams include your primary Cardiologist (physician) and Advanced Practice Providers (APPs -  Physician Assistants and Nurse Practitioners) who all work together to provide you with the care you need, when you need it.   Your next appointment:    Follow up in January 2023 after echocardiogram  The format for your next appointment:   In Person  Provider:   Kathlyn [place], MD or Christell Faith, PA-C       Important Information About Sugar

## 2022-07-16 ENCOUNTER — Ambulatory Visit
Admission: RE | Admit: 2022-07-16 | Discharge: 2022-07-16 | Disposition: A | Discharge status: Home / Self Care | Payer: Medicare Other | Source: Ambulatory Visit | Attending: Physician Assistant | Admitting: Physician Assistant

## 2022-07-16 DIAGNOSIS — R072 Precordial pain: Secondary | ICD-10-CM | POA: Diagnosis not present

## 2022-07-16 MED ORDER — REGADENOSON 0.4 MG/5ML IV SOLN
0.4000 mg | Freq: Once | INTRAVENOUS | Status: AC
Start: 2022-07-16 — End: 2022-07-16
  Administered 2022-07-16: 0.4 mg via INTRAVENOUS

## 2022-07-16 MED ORDER — TECHNETIUM TC 99M TETROFOSMIN IV KIT
33.1300 | PACK | Freq: Once | INTRAVENOUS | Status: AC | PRN
  Administered 2022-07-16: 33.13 via INTRAVENOUS

## 2022-07-16 MED ORDER — TECHNETIUM TC 99M TETROFOSMIN IV KIT
10.5000 | PACK | Freq: Once | INTRAVENOUS | Status: AC | PRN
  Administered 2022-07-16: 10.5 via INTRAVENOUS

## 2022-07-16 MED ORDER — TECHNETIUM TC 99M TETROFOSMIN IV KIT: 33.5000 | PACK | Freq: Once | INTRAVENOUS | Status: DC | PRN

## 2022-07-17 LAB — NM MYOCAR MULTI W/SPECT W/WALL MOTION / EF
LV dias vol: 86 mL (ref 46–106)
LV sys vol: 28 mL
Nuc Stress EF: 67 %
Peak HR: 81 {beats}/min
Rest HR: 70 {beats}/min
Rest Nuclear Isotope Dose: 10.5 mCi
SDS: 10
SRS: 0
SSS: 10
ST Depression (mm): 0 mm
Stress Nuclear Isotope Dose: 33.5 mCi
TID: 1.05

## 2022-07-23 ENCOUNTER — Other Ambulatory Visit (HOSPITAL_COMMUNITY): Payer: Self-pay | Admitting: Student

## 2022-07-23 ENCOUNTER — Other Ambulatory Visit: Payer: Self-pay | Admitting: Student

## 2022-07-23 DIAGNOSIS — M542 Cervicalgia: Secondary | ICD-10-CM | POA: Diagnosis not present

## 2022-07-23 DIAGNOSIS — Z6833 Body mass index (BMI) 33.0-33.9, adult: Secondary | ICD-10-CM | POA: Diagnosis not present

## 2022-07-24 ENCOUNTER — Other Ambulatory Visit: Payer: Self-pay | Admitting: Family Medicine

## 2022-07-27 ENCOUNTER — Other Ambulatory Visit: Payer: Self-pay | Admitting: Family Medicine

## 2022-07-27 DIAGNOSIS — R519 Headache, unspecified: Secondary | ICD-10-CM

## 2022-07-27 NOTE — Telephone Encounter (Signed)
Refill request Clonazepam Last office visit 04/20/22 Last refill 06/29/22 #30

## 2022-08-07 ENCOUNTER — Ambulatory Visit: Payer: Medicare Other | Attending: Physician Assistant

## 2022-08-07 DIAGNOSIS — I739 Peripheral vascular disease, unspecified: Secondary | ICD-10-CM | POA: Insufficient documentation

## 2022-08-11 ENCOUNTER — Telehealth: Payer: Self-pay | Admitting: *Deleted

## 2022-08-11 NOTE — Telephone Encounter (Signed)
-----   Message from Rise Mu, PA-C sent at 08/11/2022  8:02 AM EDT ----- ABIs normal bilaterally.  No evidence of significant peripheral arterial disease in the lower extremities.

## 2022-08-11 NOTE — Telephone Encounter (Signed)
Left voicemail message to call back for review of results.  

## 2022-08-12 ENCOUNTER — Encounter: Payer: Self-pay | Admitting: Family Medicine

## 2022-08-12 ENCOUNTER — Ambulatory Visit (INDEPENDENT_AMBULATORY_CARE_PROVIDER_SITE_OTHER): Payer: Medicare Other | Admitting: Family Medicine

## 2022-08-12 VITALS — BP 130/60 | HR 81 | Temp 98.4°F | Ht 64.5 in | Wt 219.0 lb

## 2022-08-12 DIAGNOSIS — E7800 Pure hypercholesterolemia, unspecified: Secondary | ICD-10-CM

## 2022-08-12 DIAGNOSIS — I2510 Atherosclerotic heart disease of native coronary artery without angina pectoris: Secondary | ICD-10-CM | POA: Diagnosis not present

## 2022-08-12 DIAGNOSIS — E1143 Type 2 diabetes mellitus with diabetic autonomic (poly)neuropathy: Secondary | ICD-10-CM | POA: Diagnosis not present

## 2022-08-12 DIAGNOSIS — I10 Essential (primary) hypertension: Secondary | ICD-10-CM | POA: Diagnosis not present

## 2022-08-12 DIAGNOSIS — J449 Chronic obstructive pulmonary disease, unspecified: Secondary | ICD-10-CM | POA: Diagnosis not present

## 2022-08-12 DIAGNOSIS — F3341 Major depressive disorder, recurrent, in partial remission: Secondary | ICD-10-CM | POA: Diagnosis not present

## 2022-08-12 DIAGNOSIS — Z23 Encounter for immunization: Secondary | ICD-10-CM | POA: Diagnosis not present

## 2022-08-12 LAB — POCT GLYCOSYLATED HEMOGLOBIN (HGB A1C): Hemoglobin A1C: 6.1 % — AB (ref 4.0–5.6)

## 2022-08-12 MED ORDER — GABAPENTIN 100 MG PO CAPS: 100.0000 mg | ORAL_CAPSULE | Freq: Two times a day (BID) | ORAL | 3 refills | Status: DC

## 2022-08-12 MED ORDER — ALBUTEROL SULFATE HFA 108 (90 BASE) MCG/ACT IN AERS: 2.0000 | INHALATION_SPRAY | RESPIRATORY_TRACT | 2 refills | Status: AC | PRN

## 2022-08-12 NOTE — Progress Notes (Signed)
Saahas Hidrogo T. Rosebud Koenen, MD, [place] at [facility] [place], [address]  Phone: [phone]  FAX: [place] - 71 y.o. female  MRN [id number]  Date of Birth: October 27, 1951  Date: 08/12/2022  PCP: Owens Loffler, MD  Referral: Owens Loffler, MD  Chief Complaint  Patient presents with   Follow-up    6 month   Subjective:   Amanda [name] is a 71 y.o. very pleasant female patient with Body mass index is 37.01 kg/m. who presents with the following:  F/u multiple med probs, DM, CAD, lipids, HTN:  Diabetes Mellitus: Tolerating Medications: yes Compliance with diet: fair, Body mass index is 37.01 kg/m. Exercise: minimal / intermittent Avg blood sugars at home: not checking Foot problems: none Hypoglycemia: none No nausea, vomitting, blurred vision, polyuria.  Husband is not doing well.   Lab Results  Component Value Date   HGBA1C 6.1 (A) 08/12/2022   HGBA1C 6.4 (H) 11/26/2021   HGBA1C 6.5 06/23/2021   Lab Results  Component Value Date   MICROALBUR 3.4 (H) 06/23/2021   LDLCALC 60 06/23/2021   CREATININE 0.72 12/05/2021    Wt Readings from Last 3 Encounters:  08/12/22 219 lb (99.3 kg)  07/07/22 215 lb (97.5 kg)  04/20/22 220 lb 3 oz (99.9 kg)    HTN: Tolerating all medications without side effects Stable and at goal No CP, no sob. No HA.  BP Readings from Last 3 Encounters:  08/12/22 130/60  07/07/22 134/62  04/20/22 465/03    Basic Metabolic Panel:    Component Value Date/Time   NA 139 12/05/2021 0551   K 4.1 12/05/2021 0551   CL 105 12/05/2021 0551   CO2 26 12/05/2021 0551   BUN 7 (L) 12/05/2021 0551   CREATININE 0.72 12/05/2021 0551   GLUCOSE 132 (H) 12/05/2021 0551   CALCIUM 9.0 12/05/2021 0551    Lipids: Doing well, stable. Tolerating meds fine with no SE. Panel reviewed with patient.  Lipids: Lab Results  Component Value Date   CHOL 129 06/23/2021   Lab  Results  Component Value Date   HDL 54.50 06/23/2021   Lab Results  Component Value Date   LDLCALC 60 06/23/2021   Lab Results  Component Value Date   TRIG 74.0 06/23/2021   Lab Results  Component Value Date   CHOLHDL 2 06/23/2021    Lab Results  Component Value Date   ALT 40 11/29/2021   AST 32 11/29/2021   ALKPHOS 78 11/29/2021   BILITOT 0.7 11/29/2021    Starting to have some issues with her memory.   Can be stressful with her adult daughter living at home.   She is doing all of the cooking at home, most of the yardwork still.  Review of Systems is noted in the HPI, as appropriate  Objective:   BP 130/60   Pulse 81   Temp 98.4 F (36.9 C) (Oral)   Ht 5' 4.5" (1.638 m)   Wt 219 lb (99.3 kg)   SpO2 97%   BMI 37.01 kg/m   GEN: No acute distress; alert,appropriate. PULM: Breathing comfortably in no respiratory distress PSYCH: Normally interactive.  CV: RRR, no m/g/r  PULM: Normal respiratory rate, no accessory muscle use. No wheezes, crackles or rhonchi   Laboratory and Imaging Data: Results for orders placed or performed in visit on 08/12/22  POCT glycosylated hemoglobin (Hb A1C)  Result Value Ref Range   Hemoglobin A1C 6.1 (  A) 4.0 - 5.6 %   HbA1c POC (<> result, manual entry)     HbA1c, POC (prediabetic range)     HbA1c, POC (controlled diabetic range)       Assessment and Plan:     ICD-10-CM   1. Controlled type 2 diabetes mellitus with diabetic autonomic neuropathy, without long-term current use of insulin (HCC)  E11.43 POCT glycosylated hemoglobin (Hb A1C)    2. COPD with chronic bronchitis ([place])  J44.9     3. Coronary artery disease involving native coronary artery of native heart without angina pectoris  I25.10     4. Essential hypertension  I10     5. Major depressive disorder, recurrent episode, in partial remission ([place])  F33.41     6. Pure hypercholesterolemia  E78.00      BS is doing well  Still with some neuropathy, so increase  gabapentin dosing.   With COPD, some sob in the heat, but not using inhalers at all.  First, will see if she will do a rescue inhaler.   Cont to smoke.  Medication Management during today's office visit: Meds ordered this encounter  Medications   gabapentin (NEURONTIN) 100 MG capsule    Sig: Take 1 capsule (100 mg total) by mouth 2 (two) times daily.    Dispense:  180 capsule    Refill:  3   albuterol (VENTOLIN HFA) 108 (90 Base) MCG/ACT inhaler    Sig: Inhale 2 puffs into the lungs every 4 (four) hours as needed for wheezing or shortness of breath.    Dispense:  3 each    Refill:  2   Medications Discontinued During This Encounter  Medication Reason   amoxicillin-clavulanate (AUGMENTIN) 875-125 MG tablet Completed Course   gabapentin (NEURONTIN) 100 MG capsule Reorder   albuterol (PROVENTIL HFA;VENTOLIN HFA) 108 (90 Base) MCG/ACT inhaler Reorder    Orders placed today for conditions managed today: Orders Placed This Encounter  Procedures   POCT glycosylated hemoglobin (Hb A1C)    Disposition: Return in about 6 months (around 02/10/2023) for Medicare Wellness Exam.  Dragon [employer] One speech-to-text software was used for transcription in this dictation.  Possible transcriptional errors can occur using Editor, commissioning.   Signed,  Maud Deed. Areej Tayler, MD   Outpatient Encounter Medications as of 08/12/2022  Medication Sig   acetaminophen (TYLENOL) 650 MG CR tablet Take 650 mg by mouth 3 times/day as needed-between meals & bedtime (for headaches).   amitriptyline (ELAVIL) 50 MG tablet TAKE 2 TABLETS EVERY DAY AT BEDTIME   Ascorbic Acid (VITAMIN C) 1000 MG tablet Take 1,000 mg by mouth daily.   aspirin EC 81 MG tablet Take 81 mg by mouth daily.   buPROPion (WELLBUTRIN XL) 150 MG 24 hr tablet TAKE 1 TABLET EVERY DAY   calcium carbonate (TUMS - DOSED IN MG ELEMENTAL CALCIUM) 500 MG chewable tablet Chew 2 tablets by mouth daily as needed for indigestion or heartburn.    Cholecalciferol (VITAMIN D) 1000 UNITS capsule Take 1,000 Units by mouth daily.    clonazePAM (KLONOPIN) 0.5 MG tablet Take 1 tablet by mouth twice daily as needed   cyclobenzaprine (FLEXERIL) 10 MG tablet TAKE 1 TABLET THREE TIMES DAILY AS NEEDED FOR MUSCLE SPAMS   dimenhyDRINATE (DRAMAMINE) 50 MG tablet Take 50 mg by mouth every 8 (eight) hours as needed.   ezetimibe (ZETIA) 10 MG tablet TAKE 1 TABLET EVERY DAY   FLUoxetine (PROZAC) 40 MG capsule Take 2 capsules (80 mg total) by mouth daily.  guaiFENesin (MUCINEX) 600 MG 12 hr tablet Take 600 mg by mouth daily.   metoprolol tartrate (LOPRESSOR) 25 MG tablet TAKE 1/2 TABLET TWICE DAILY   Multiple Vitamin (MULTIVITAMIN) tablet Take 1 tablet by mouth daily.   naproxen sodium (ALEVE) 220 MG tablet Take 220 mg by mouth daily.   ondansetron (ZOFRAN-ODT) 4 MG disintegrating tablet Take 1 tablet (4 mg total) by mouth every 8 (eight) hours as needed for nausea or vomiting.   pantoprazole (PROTONIX) 40 MG tablet TAKE 1 TABLET 30 MINUTES PRIOR TO BREAKFAST   rosuvastatin (CRESTOR) 40 MG tablet TAKE 1 TABLET EVERY DAY   Simethicone 125 MG CAPS Take 125 mg by mouth daily as needed (gas).   vitamin E 180 MG (400 UNITS) capsule Take 400 Units by mouth daily.   [DISCONTINUED] albuterol (PROVENTIL HFA;VENTOLIN HFA) 108 (90 Base) MCG/ACT inhaler Inhale 2 puffs into the lungs every 4 (four) hours as needed for wheezing or shortness of breath.   [DISCONTINUED] gabapentin (NEURONTIN) 100 MG capsule Take 1 capsule (100 mg total) by mouth at bedtime.   albuterol (VENTOLIN HFA) 108 (90 Base) MCG/ACT inhaler Inhale 2 puffs into the lungs every 4 (four) hours as needed for wheezing or shortness of breath.   gabapentin (NEURONTIN) 100 MG capsule Take 1 capsule (100 mg total) by mouth 2 (two) times daily.   [DISCONTINUED] amoxicillin-clavulanate (AUGMENTIN) 875-125 MG tablet Take 1 tablet by mouth 2 (two) times daily. (Patient not taking: Reported on 07/07/2022)   No  facility-administered encounter medications on file as of 08/12/2022.

## 2022-08-12 NOTE — Addendum Note (Signed)
Addended by: Carter Kitten on: 08/12/2022 02:41 PM   Modules accepted: Orders

## 2022-08-24 ENCOUNTER — Other Ambulatory Visit: Payer: Self-pay | Admitting: Family Medicine

## 2022-08-24 NOTE — Telephone Encounter (Signed)
Last office visit 08/12/22 for DM/COPD/HTN/MDD.  Last refilled 12/17/21 for #60 with 3 refills.  CPE scheduled 02/10/2023.

## 2022-08-27 ENCOUNTER — Other Ambulatory Visit: Payer: Self-pay | Admitting: Family Medicine

## 2022-08-27 DIAGNOSIS — R921 Mammographic calcification found on diagnostic imaging of breast: Secondary | ICD-10-CM

## 2022-09-02 ENCOUNTER — Ambulatory Visit
Admission: RE | Admit: 2022-09-02 | Discharge: 2022-09-02 | Disposition: A | Discharge status: Home / Self Care | Payer: Medicare Other | Source: Ambulatory Visit | Attending: Student | Admitting: Student

## 2022-09-02 DIAGNOSIS — M542 Cervicalgia: Secondary | ICD-10-CM | POA: Diagnosis not present

## 2022-09-10 ENCOUNTER — Telehealth: Payer: Self-pay | Admitting: *Deleted

## 2022-09-10 NOTE — Patient Outreach (Signed)
  Care Coordination   Initial Visit Note   09/10/2022 Name: Amanda [name] MRN: [id number] DOB: 10-02-1951  Amanda [name] is a 71 y.o. year old female who sees Copland, Frederico Hamman, MD for primary care. I spoke with  Lela Fason by phone today.  What matters to the patients health and wellness today?  No health concerns voiced. RN discussed [place], RN, SW, and Pharmacist. Patient declined services.     Goals Addressed             This Visit's Progress    Advised to follow up on vaccines and AWV          SDOH assessments and interventions completed:  Yes     Care Coordination Interventions Activated:  Yes  Care Coordination Interventions:  No, not indicated   Follow up plan: No further intervention required.   Encounter Outcome:  Pt. Visit Completed   Minor Management [phone]

## 2022-09-16 ENCOUNTER — Ambulatory Visit
Admission: RE | Admit: 2022-09-16 | Discharge: 2022-09-16 | Disposition: A | Discharge status: Home / Self Care | Payer: Medicare Other | Source: Ambulatory Visit | Attending: Family Medicine | Admitting: Family Medicine

## 2022-09-16 DIAGNOSIS — R921 Mammographic calcification found on diagnostic imaging of breast: Secondary | ICD-10-CM | POA: Insufficient documentation

## 2022-09-17 DIAGNOSIS — M542 Cervicalgia: Secondary | ICD-10-CM | POA: Diagnosis not present

## 2022-09-29 DIAGNOSIS — M5412 Radiculopathy, cervical region: Secondary | ICD-10-CM | POA: Diagnosis not present

## 2022-10-07 ENCOUNTER — Encounter: Payer: Self-pay | Admitting: Podiatry

## 2022-10-07 ENCOUNTER — Ambulatory Visit (INDEPENDENT_AMBULATORY_CARE_PROVIDER_SITE_OTHER): Payer: Medicare Other | Admitting: Podiatry

## 2022-10-07 DIAGNOSIS — E1142 Type 2 diabetes mellitus with diabetic polyneuropathy: Secondary | ICD-10-CM | POA: Diagnosis not present

## 2022-10-07 DIAGNOSIS — B351 Tinea unguium: Secondary | ICD-10-CM

## 2022-10-07 DIAGNOSIS — M79676 Pain in unspecified toe(s): Secondary | ICD-10-CM | POA: Diagnosis not present

## 2022-10-08 NOTE — Progress Notes (Signed)
She presents today chief complaint of painful elongated toenails bilaterally.  Objective: Toenails are long thick yellow dystrophic-like mycotic painful palpation.  No open lesions or wounds are noted.  Pulses are normal.  Assessment: Pain in limb secondary to diabetic peripheral neuropathy and painful elongated nails sharply incurvated.  Mycotic in nature.  Plan: Debridement of toenails 1 through 5 bilateral

## 2022-10-20 DIAGNOSIS — M5412 Radiculopathy, cervical region: Secondary | ICD-10-CM | POA: Diagnosis not present

## 2022-10-20 DIAGNOSIS — Z6834 Body mass index (BMI) 34.0-34.9, adult: Secondary | ICD-10-CM | POA: Diagnosis not present

## 2022-10-25 ENCOUNTER — Other Ambulatory Visit: Payer: Self-pay | Admitting: Family Medicine

## 2022-11-11 ENCOUNTER — Ambulatory Visit: Payer: Medicare Other | Attending: Physician Assistant

## 2022-11-11 DIAGNOSIS — I350 Nonrheumatic aortic (valve) stenosis: Secondary | ICD-10-CM

## 2022-11-11 LAB — ECHOCARDIOGRAM COMPLETE
AR max vel: 1.3 cm2
AV Area VTI: 1.53 cm2
AV Area mean vel: 1.31 cm2
AV Mean grad: 26.8 mmHg
AV Peak grad: 52.4 mmHg
AV Vena cont: 0.45 cm
Ao pk vel: 3.62 m/s
Area-P 1/2: 3.36 cm2
Calc EF: 57.6 %
P 1/2 time: 243 msec
S' Lateral: 2.2 cm
Single Plane A2C EF: 57 %
Single Plane A4C EF: 58.1 %

## 2022-11-27 ENCOUNTER — Telehealth: Payer: Self-pay

## 2022-11-27 NOTE — Telephone Encounter (Signed)
Amanda [name] - Client Nonclinical Telephone Record  [facility] Client Amanda [name] - Client Client Site [place] - [name] Provider Owens Loffler - MD Contact Type Call Who Is Calling Patient / Member / Family / Caregiver Caller Name [place] Phone Number [phone] Call Type Message Only Information Provided Reason for Call Returning a Call from the Office Initial Comment Pt missed a call. She is returning a call. Additional Comment Please call pt back. Disp. Time Disposition Final User 11/26/2022 5:17:13 PM General Information Provided Yes Paul Dykes Call Closed By: Paul Dykes Transaction Date/Time: 11/26/2022 5:14:42 PM (ET  Sending note to [facility] pool and lsc support.

## 2022-11-27 NOTE — Telephone Encounter (Signed)
I have not called patient.  Will forward to Dr. Lorelei Pont to see if he has tried to reach patient.

## 2022-12-05 ENCOUNTER — Other Ambulatory Visit: Payer: Self-pay | Admitting: Family Medicine

## 2022-12-18 ENCOUNTER — Ambulatory Visit (INDEPENDENT_AMBULATORY_CARE_PROVIDER_SITE_OTHER): Payer: Medicare Other

## 2022-12-18 VITALS — Ht 64.5 in | Wt 219.0 lb

## 2022-12-18 DIAGNOSIS — Z0000 Encounter for general adult medical examination without abnormal findings: Secondary | ICD-10-CM | POA: Diagnosis not present

## 2022-12-18 NOTE — Progress Notes (Addendum)
Subjective:   Amanda [name] is a 72 y.o. female who presents for Medicare Annual (Subsequent) preventive examination.  Review of Systems    No ROS.  Medicare Wellness Virtual Visit.  Visual/audio telehealth visit, UTA vital signs.   See social history for additional risk factors.   Cardiac Risk Factors include: advanced age (>33mn, >>60women);diabetes mellitus;hypertension     Objective:    Today's Vitals   12/18/22 0949  Weight: 219 lb (99.3 kg)  Height: 5' 4.5" (1.638 m)   Body mass index is 37.01 kg/m.     12/18/2022    9:55 AM 02/26/2022    7:45 AM 02/06/2022   10:12 AM 12/20/2021   12:16 PM 12/02/2021    1:39 PM 11/30/2021    3:00 PM 11/29/2021    9:04 PM  Advanced Directives  Does Patient Have a Medical Advance Directive? Yes Yes Yes Yes Yes Yes Yes  Type of AParamedicof AVan HornLiving will Living will Living will;Healthcare Power of ANorth MerrickLiving will Living will Living will  Does patient want to make changes to medical advance directive? No - Patient declined   No - Patient declined No - Patient declined No - Patient declined   [place]? No - copy requested        Would patient like information on creating a medical advance directive?    No - Patient declined No - Patient declined No - Patient declined     Current Medications (verified) Outpatient Encounter Medications as of 12/18/2022  Medication Sig   acetaminophen (TYLENOL) 650 MG CR tablet Take 650 mg by mouth 3 times/day as needed-between meals & bedtime (for headaches).   albuterol (VENTOLIN HFA) 108 (90 Base) MCG/ACT inhaler Inhale 2 puffs into the lungs every 4 (four) hours as needed for wheezing or shortness of breath.   amitriptyline (ELAVIL) 50 MG tablet TAKE 2 TABLETS EVERY DAY AT BEDTIME   Ascorbic Acid (VITAMIN C) 1000 MG tablet Take 1,000 mg by mouth daily.   aspirin EC 81 MG  tablet Take 81 mg by mouth daily.   buPROPion (WELLBUTRIN XL) 150 MG 24 hr tablet TAKE 1 TABLET EVERY DAY   calcium carbonate (TUMS - DOSED IN MG ELEMENTAL CALCIUM) 500 MG chewable tablet Chew 2 tablets by mouth daily as needed for indigestion or heartburn.   Cholecalciferol (VITAMIN D) 1000 UNITS capsule Take 1,000 Units by mouth daily.    clonazePAM (KLONOPIN) 0.5 MG tablet Take 1 tablet by mouth twice daily as needed   cyclobenzaprine (FLEXERIL) 10 MG tablet Take 1 tablet by mouth three times daily as needed for muscle spasm   dimenhyDRINATE (DRAMAMINE) 50 MG tablet Take 50 mg by mouth every 8 (eight) hours as needed.   ezetimibe (ZETIA) 10 MG tablet TAKE 1 TABLET EVERY DAY   FLUoxetine (PROZAC) 40 MG capsule Take 2 capsules (80 mg total) by mouth daily.   gabapentin (NEURONTIN) 100 MG capsule Take 1 capsule (100 mg total) by mouth 2 (two) times daily.   guaiFENesin (MUCINEX) 600 MG 12 hr tablet Take 600 mg by mouth daily.   metoprolol tartrate (LOPRESSOR) 25 MG tablet TAKE 1/2 TABLET TWICE DAILY   Multiple Vitamin (MULTIVITAMIN) tablet Take 1 tablet by mouth daily.   naproxen sodium (ALEVE) 220 MG tablet Take 220 mg by mouth daily.   ondansetron (ZOFRAN-ODT) 4 MG disintegrating tablet Take 1 tablet (4 mg total) by mouth  every 8 (eight) hours as needed for nausea or vomiting.   pantoprazole (PROTONIX) 40 MG tablet TAKE 1 TABLET 30 MINUTES PRIOR TO BREAKFAST   rosuvastatin (CRESTOR) 40 MG tablet TAKE 1 TABLET EVERY DAY   Simethicone 125 MG CAPS Take 125 mg by mouth daily as needed (gas).   vitamin E 180 MG (400 UNITS) capsule Take 400 Units by mouth daily.   No facility-administered encounter medications on file as of 12/18/2022.    Allergies (verified) Honey bee venom [bee venom] and Diazepam   History: Past Medical History:  Diagnosis Date   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 2013: nl LV sys fxn, mild MR, no evidence of pulm htn; b. TTE 8/17: EF 60-65%, no  RWMA, nl LV dia fxn,  mild AS, mod AI   CAD (coronary artery disease), native coronary artery 11/26/2020   Controlled type 2 diabetes mellitus with diabetic autonomic neuropathy, without long-term current use of insulin ([place]) 08/10/2007   Qualifier: Diagnosis of  By: Council Mechanic MD, Hilaria Ota    COPD (chronic obstructive pulmonary disease) ([place]) 10/30/2016   Depression    Fracture in accidental fall    GAD (generalized anxiety disorder)    GERD (gastroesophageal reflux disease)    Hiatal hernia with gastroesophageal reflux 1997   Hyperlipidemia    Hypertension    Osteoporosis    Tobacco abuse    Past Surgical History:  Procedure Laterality Date   ABDOMINAL HYSTERECTOMY  1993   ANTERIOR CERVICAL DECOMP/DISCECTOMY FUSION N/A 10/26/2018   Procedure: ACDF C4-C5 C5-C6 C6-C7;  Surgeon: Kary Kos, MD;  Location: [facility];  Service: Neurosurgery;  Laterality: N/A;   APPENDECTOMY     BACK SURGERY     BREAST BIOPSY Right 2007   benign   CHOLECYSTECTOMY     COLONOSCOPY     30 years ago was normal per pt.    DILATION AND CURETTAGE OF UTERUS     ESOPHAGEAL DILATION     x 3   ESOPHAGOGASTRODUODENOSCOPY (EGD) WITH PROPOFOL N/A 02/06/2022   Procedure: ESOPHAGOGASTRODUODENOSCOPY (EGD) WITH PROPOFOL;  Surgeon: Jonathon Bellows, MD;  Location: [facility] ENDOSCOPY;  Service: Gastroenterology;  Laterality: N/A;   ESOPHAGOGASTRODUODENOSCOPY (EGD) WITH PROPOFOL N/A 02/26/2022   Procedure: ESOPHAGOGASTRODUODENOSCOPY (EGD) WITH PROPOFOL;  Surgeon: Jonathon Bellows, MD;  Location: [facility] ENDOSCOPY;  Service: Gastroenterology;  Laterality: N/A;  EGD+dilation per Dr. Vicente Males   fractured leg Right    INCISION AND DRAINAGE Right 12/02/2021   Procedure: INCISION AND DRAINAGE;  Surgeon: Hessie Knows, MD;  Location: [facility] ORS;  Service: Orthopedics;  Laterality: Right;   LEFT HEART CATH AND CORONARY ANGIOGRAPHY N/A 11/11/2020   Procedure: LEFT HEART CATH AND CORONARY ANGIOGRAPHY;  Surgeon: Wellington Hampshire, MD;  Location: [facility]  CV LAB;  Service: Cardiovascular;  Laterality: N/A;   OOPHORECTOMY     OVARIAN CYST REMOVAL  1972   SALIVARY GLAND SURGERY     TONSILLECTOMY     4 yoa   TOTAL KNEE ARTHROPLASTY Left 12/24/2015   Procedure: LEFT TOTAL KNEE ARTHROPLASTY;  Surgeon: [name], MD;  Location: [place];  Service: Orthopedics;  Laterality: Left;   TOTAL KNEE ARTHROPLASTY Right 12/30/2017   Procedure: RIGHT TOTAL KNEE ARTHROPLASTY;  Surgeon: [name], MD;  Location: [place];  Service: Orthopedics;  Laterality: Right;   WRIST FRACTURE SURGERY  11/2008   Family History  Problem Relation Age of Onset   Hyperlipidemia Mother  Arthritis Mother    Diabetes Mother    Heart disease Mother    Colon polyps Mother    Cancer Paternal Grandmother        breast   Hypercholesterolemia Sister    Colon polyps Sister    Breast cancer Sister    Colon cancer Neg Hx    Esophageal cancer Neg Hx    Rectal cancer Neg Hx    Stomach cancer Neg Hx    Pancreatic cancer Neg Hx    Social History   Socioeconomic History   Marital status: Married    Spouse name: Not on file   Number of children: 2   Years of education: Not on file   Highest education level: Not on file  Occupational History   Occupation: [employer]    Employer: [employer]  Tobacco Use   Smoking status: Every Day    Packs/day: 0.75    Years: 45.00    Total pack years: 33.75    Types: Cigarettes   Smokeless tobacco: Never   Tobacco comments:    Smoking half PPD but is trying to quit.   Vaping Use   Vaping Use: Never used  Substance and Sexual Activity   Alcohol use: Yes    Alcohol/week: 0.0 standard drinks of alcohol    Comment: glass of wine twice a month    Drug use: No   Sexual activity: Not Currently  Other Topics Concern   Not on file  Social History Narrative   Not on file   Social Determinants of Health   Financial Resource Strain: Low Risk  (12/18/2022)   Overall Financial Resource  Strain (CARDIA)    Difficulty of Paying Living Expenses: Not hard at all  Food Insecurity: No Food Insecurity (12/18/2022)   Hunger Vital Sign    Worried About Running Out of Food in the Last Year: Never true    Ran Out of Food in the Last Year: Never true  Transportation Needs: No Transportation Needs (12/18/2022)   PRAPARE - Hydrologist (Medical): No    Lack of Transportation (Non-Medical): No  Physical Activity: Unknown (12/18/2022)   Exercise Vital Sign    Days of Exercise per Week: 0 days    Minutes of Exercise per Session: Not on file  Stress: No Stress Concern Present (12/18/2022)   [place]    Feeling of Stress : Not at all  Social Connections: Unknown (12/18/2022)   Social Connection and Isolation Panel [NHANES]    Frequency of Communication with Friends and Family: Not on file    Frequency of Social Gatherings with Friends and Family: More than three times a week    Attends Religious Services: Not on Advertising copywriter or Organizations: Not on file    Attends Archivist Meetings: Not on file    Marital Status: Married    Tobacco Counseling Ready to quit: Not Answered Counseling given: Not Answered Tobacco comments: Smoking half PPD but is trying to quit.    Clinical Intake:  Pre-visit preparation completed: Yes        Diabetes: Yes (Followed by PCP)  How often do you need to have someone help you when you read instructions, pamphlets, or other written materials from your doctor or pharmacy?: 1 - Never   Interpreter Needed?: No      Activities of Daily Living    12/18/2022  9:43 AM  In your present state of health, do you have any difficulty performing the following activities:  Hearing? 0  Vision? 0  Difficulty concentrating or making decisions? 1  Comment Age appropriate memory changes. Short term memory changes. Declines additional  follow up at this time.  Walking or climbing stairs? 0  Dressing or bathing? 0  Doing errands, shopping? 0  Preparing Food and eating ? N  Using the Toilet? N  In the past six months, have you accidently leaked urine? Y  Comment wears daily liner  Do you have problems with loss of bowel control? N  Managing your Medications? N  Comment Daughter assist as needed  Managing your Finances? Y  Comment Daughter assist as needed  Housekeeping or managing your Housekeeping? N    Patient Care Team: Owens Loffler, MD as PCP - General (Family Medicine) Wellington Hampshire, MD as PCP - Cardiology (Cardiology) Debbora Dus, [facility] as Pharmacist (Pharmacist)  Indicate any recent Medical Services you may have received from other than [facility] providers in the past year (date may be approximate).     Assessment:   This is a routine wellness examination for Amanda [name].  I connected with  Amanda [name] on 12/18/22 by a audio enabled telemedicine application and verified that I am speaking with the correct person using two identifiers. Additional information received from daughter, HIPAA compliant.   Patient Location: Home  Provider Location: Office/Clinic  I discussed the limitations of evaluation and management by telemedicine. The patient expressed understanding and agreed to proceed.   Hearing/Vision screen Hearing Screening - Comments:: Patient is able to hear conversational tones without difficulty.  No issues reported.  Vision Screening - Comments:: Annual eye exams encouraged Wears reader glasses    Dietary issues and exercise activities discussed: Current Exercise Habits: The patient does not participate in regular exercise at present She monitors diet intake and tries to have a healthy diet.   Goals Addressed             This Visit's Progress    COMPLETED: Advised to follow up on vaccines and AWV       Quit Smoking       No tobacco use in the last 3-4 weeks        Depression Screen    12/18/2022    9:54 AM 12/17/2021   12:33 PM 12/16/2021    9:11 AM 08/15/2020    3:10 PM 05/30/2019    1:12 PM 06/02/2017    1:45 PM  PHQ 2/9 Scores  PHQ - 2 Score 0 3 0 2 0 0  PHQ- 9 Score  17  15 0     Fall Risk    12/18/2022    9:57 AM 12/17/2021   10:24 AM 12/16/2021    9:11 AM 08/15/2020    2:32 PM 05/30/2019    1:12 PM  Fall Risk   Falls in the past year? '1 1 1 1 '$ 0  Number falls in past yr:  1 1 0   Injury with Fall?  '1 1 1   '$ Follow up Falls evaluation completed;Falls prevention discussed        FALL RISK PREVENTION PERTAINING TO THE HOME: Home free of loose throw rugs in walkways, pet beds, electrical cords, etc? Yes  Adequate lighting in your home to reduce risk of falls? Yes   ASSISTIVE DEVICES UTILIZED TO PREVENT FALLS: Use of a cane, walker or w/c? Yes , as needed.  TIMED UP AND  GO: Was the test performed? No .   Cognitive Function: Patient is alert and oriented x3.     05/30/2019    1:12 PM 06/02/2017    1:18 PM 10/25/2015    1:28 PM  MMSE - Mini Mental State Exam  Orientation to time '5 5 5  '$ Orientation to Place '5 5 5  '$ Registration '3 3 3  '$ Attention/ Calculation 0 0 5  Recall '3 3 3  '$ Language- name 2 objects 0 0 2  Language- repeat '1 1 1  '$ Language- follow 3 step command 0 3 3  Language- read & follow direction 0 0 1  Write a sentence 0 0 1  Copy design 0 0 1  Total score '17 20 30        '$ 12/18/2022   10:25 AM  6CIT Screen  What Year? 0 points  What month? 0 points  What time? 0 points    Immunizations Immunization History  Administered Date(s) Administered   Fluad Quad(high Dose 65+) 08/15/2020, 08/12/2022   Influenza Split 09/09/2011, 10/10/2012   Influenza Whole 09/20/2006   Influenza, High Dose Seasonal PF 08/15/2019   Influenza,inj,Quad PF,6+ Mos 09/18/2015, 09/16/2016, 09/13/2017   PFIZER(Purple Top)SARS-COV-2 Vaccination 01/25/2020, 02/01/2020   PPD Test 12/27/2015   Pneumococcal Conjugate-13 09/18/2015    Pneumococcal Polysaccharide-23 11/23/2016   Td 12/26/1998, 09/13/2017   Covid-19 vaccine status: Completed vaccines   Shingrix Completed?: No.    Education has been provided regarding the importance of this vaccine. Patient has been advised to call insurance company to determine out of pocket expense if they have not yet received this vaccine. Advised may also receive vaccine at local pharmacy or Health Dept. Verbalized acceptance and understanding.  Screening Tests Health Maintenance  Topic Date Due   COVID-19 Vaccine (3 - Pfizer risk series) 01/03/2023 (Originally 02/29/2020)   Lung Cancer Screening  02/09/2023 (Originally 09/03/2019)   Diabetic kidney evaluation - eGFR measurement  02/09/2023 (Originally 12/05/2022)   Diabetic kidney evaluation - Urine ACR  02/09/2023 (Originally 06/23/2022)   OPHTHALMOLOGY EXAM  02/09/2023 (Originally 07/30/2020)   DEXA SCAN  02/09/2023 (Originally 05/19/2015)   Zoster Vaccines- Shingrix (1 of 2) 03/19/2023 (Originally 05/18/1970)   HEMOGLOBIN A1C  02/10/2023   FOOT EXAM  10/08/2023   Medicare Annual Wellness (AWV)  12/19/2023   MAMMOGRAM  09/16/2024   COLONOSCOPY (Pts 45-82yr Insurance coverage will need to be confirmed)  11/19/2025   DTaP/Tdap/Td (3 - Tdap) 09/14/2027   Pneumonia Vaccine 72 Years old  Completed   INFLUENZA VACCINE  Completed   Hepatitis C Screening  Completed   HPV VACCINES  Aged Out    Health Maintenance There are no preventive care reminders to display for this patient.  Bone density- deferred at this time.   Lung Cancer Screening: deferred at this time.   Hepatitis C Screening: Completed 05/2017.  Vision Screening: Recommended annual ophthalmology exams for early detection of glaucoma and other disorders of the eye.  Dental Screening: Recommended annual dental exams for proper oral hygiene.  Community Resource Referral / Chronic Care Management: CRR required this visit?  No   CCM required this visit?  No       Plan:     I have personally reviewed and noted the following in the patient's chart:   Medical and social history Use of alcohol, tobacco or illicit drugs  Current medications and supplements including opioid prescriptions. Patient is not currently taking opioid prescriptions. Functional ability and status Nutritional status Physical activity Advanced directives List  of other physicians Hospitalizations, surgeries, and ER visits in previous 12 months Vitals Screenings to include cognitive, depression, and falls Referrals and appointments  In addition, I have reviewed and discussed with patient certain preventive protocols, quality metrics, and best practice recommendations. A written personalized care plan for preventive services as well as general preventive health recommendations were provided to patient.     Leta Jungling, LPN   [id number]

## 2022-12-18 NOTE — Patient Instructions (Addendum)
Amanda [name] , Thank you for taking time to come for your Medicare Wellness Visit. I appreciate your ongoing commitment to your health goals. Please review the following plan we discussed and let me know if I can assist you in the future.   These are the goals we discussed:  Goals Addressed             This Visit's Progress    COMPLETED: Advised to follow up on vaccines and AWV       Quit Smoking       No tobacco use in the last 3-4 weeks         This is a list of the screening recommended for you and due dates:  Health Maintenance  Topic Date Due   COVID-19 Vaccine (3 - Pfizer risk series) 01/03/2023*   Screening for Lung Cancer  02/09/2023*   Yearly kidney function blood test for diabetes  02/09/2023*   Yearly kidney health urinalysis for diabetes  02/09/2023*   Eye exam for diabetics  02/09/2023*   DEXA scan (bone density measurement)  02/09/2023*   Zoster (Shingles) Vaccine (1 of 2) 03/19/2023*   Hemoglobin A1C  02/10/2023   Complete foot exam   10/08/2023   Medicare Annual Wellness Visit  12/19/2023   Mammogram  09/16/2024   Colon Cancer Screening  11/19/2025   DTaP/Tdap/Td vaccine (3 - Tdap) 09/14/2027   Pneumonia Vaccine  Completed   Flu Shot  Completed   Hepatitis C Screening: USPSTF Recommendation to screen - Ages 18-79 yo.  Completed   HPV Vaccine  Aged Out  *Topic was postponed. The date shown is not the original due date.    Advanced directives: End of life planning; Advance aging; Advanced directives discussed.  Copy of current HCPOA/Living Will requested.    Conditions/risks identified: none new  Next appointment: Follow up in one year for your annual wellness visit    Preventive Care 65 Years and Older, Female Preventive care refers to lifestyle choices and visits with your health care provider that can promote health and wellness. What does preventive care include? A yearly physical exam. This is also called an annual well check. Dental exams once or  twice a year. Routine eye exams. Ask your health care provider how often you should have your eyes checked. Personal lifestyle choices, including: Daily care of your teeth and gums. Regular physical activity. Eating a healthy diet. Avoiding tobacco and drug use. Limiting alcohol use. Practicing safe sex. Taking low-dose aspirin every day. Taking vitamin and mineral supplements as recommended by your health care provider. What happens during an annual well check? The services and screenings done by your health care provider during your annual well check will depend on your age, overall health, lifestyle risk factors, and family history of disease. Counseling  Your health care provider may ask you questions about your: Alcohol use. Tobacco use. Drug use. Emotional well-being. Home and relationship well-being. Sexual activity. Eating habits. History of falls. Memory and ability to understand (cognition). Work and work Statistician. Reproductive health. Screening  You may have the following tests or measurements: Height, weight, and BMI. Blood pressure. Lipid and cholesterol levels. These may be checked every 5 years, or more frequently if you are over 37 years old. Skin check. Lung cancer screening. You may have this screening every year starting at age 9 if you have a 30-pack-year history of smoking and currently smoke or have quit within the past 15 years. Fecal occult blood test (FOBT) of  the stool. You may have this test every year starting at age 70. Flexible sigmoidoscopy or colonoscopy. You may have a sigmoidoscopy every 5 years or a colonoscopy every 10 years starting at age 71. Hepatitis C blood test. Hepatitis B blood test. Sexually transmitted disease (STD) testing. Diabetes screening. This is done by checking your blood sugar (glucose) after you have not eaten for a while (fasting). You may have this done every 1-3 years. Bone density scan. This is done to screen for  osteoporosis. You may have this done starting at age 2. Mammogram. This may be done every 1-2 years. Talk to your health care provider about how often you should have regular mammograms. Talk with your health care provider about your test results, treatment options, and if necessary, the need for more tests. Vaccines  Your health care provider may recommend certain vaccines, such as: Influenza vaccine. This is recommended every year. Tetanus, diphtheria, and acellular pertussis (Tdap, Td) vaccine. You may need a Td booster every 10 years. Zoster vaccine. You may need this after age 27. Pneumococcal 13-valent conjugate (PCV13) vaccine. One dose is recommended after age 68. Pneumococcal polysaccharide (PPSV23) vaccine. One dose is recommended after age 67. Talk to your health care provider about which screenings and vaccines you need and how often you need them. This information is not intended to replace advice given to you by your health care provider. Make sure you discuss any questions you have with your health care provider. Document Released: 12/20/2015 Document Revised: 08/12/2016 Document Reviewed: 09/24/2015 Elsevier Interactive Patient Education  2017 [place] Prevention in the Home Falls can cause injuries. They can happen to people of all ages. There are many things you can do to make your home safe and to help prevent falls. What can I do on the outside of my home? Regularly fix the edges of walkways and driveways and fix any cracks. Remove anything that might make you trip as you walk through a door, such as a raised step or threshold. Trim any bushes or trees on the path to your home. Use bright outdoor lighting. Clear any walking paths of anything that might make someone trip, such as rocks or tools. Regularly check to see if handrails are loose or broken. Make sure that both sides of any steps have handrails. Any raised decks and porches should have guardrails on  the edges. Have any leaves, snow, or ice cleared regularly. Use sand or salt on walking paths during winter. Clean up any spills in your garage right away. This includes oil or grease spills. What can I do in the bathroom? Use night lights. Install grab bars by the toilet and in the tub and shower. Do not use towel bars as grab bars. Use non-skid mats or decals in the tub or shower. If you need to sit down in the shower, use a plastic, non-slip stool. Keep the floor dry. Clean up any water that spills on the floor as soon as it happens. Remove soap buildup in the tub or shower regularly. Attach bath mats securely with double-sided non-slip rug tape. Do not have throw rugs and other things on the floor that can make you trip. What can I do in the bedroom? Use night lights. Make sure that you have a light by your bed that is easy to reach. Do not use any sheets or blankets that are too big for your bed. They should not hang down onto the floor. Have a firm chair  that has side arms. You can use this for support while you get dressed. Do not have throw rugs and other things on the floor that can make you trip. What can I do in the kitchen? Clean up any spills right away. Avoid walking on wet floors. Keep items that you use a lot in easy-to-reach places. If you need to reach something above you, use a strong step stool that has a grab bar. Keep electrical cords out of the way. Do not use floor polish or wax that makes floors slippery. If you must use wax, use non-skid floor wax. Do not have throw rugs and other things on the floor that can make you trip. What can I do with my stairs? Do not leave any items on the stairs. Make sure that there are handrails on both sides of the stairs and use them. Fix handrails that are broken or loose. Make sure that handrails are as long as the stairways. Check any carpeting to make sure that it is firmly attached to the stairs. Fix any carpet that is loose  or worn. Avoid having throw rugs at the top or bottom of the stairs. If you do have throw rugs, attach them to the floor with carpet tape. Make sure that you have a light switch at the top of the stairs and the bottom of the stairs. If you do not have them, ask someone to add them for you. What else can I do to help prevent falls? Wear shoes that: Do not have high heels. Have rubber bottoms. Are comfortable and fit you well. Are closed at the toe. Do not wear sandals. If you use a stepladder: Make sure that it is fully opened. Do not climb a closed stepladder. Make sure that both sides of the stepladder are locked into place. Ask someone to hold it for you, if possible. Clearly mark and make sure that you can see: Any grab bars or handrails. First and last steps. Where the edge of each step is. Use tools that help you move around (mobility aids) if they are needed. These include: Canes. Walkers. Scooters. Crutches. Turn on the lights when you go into a dark area. Replace any light bulbs as soon as they burn out. Set up your furniture so you have a clear path. Avoid moving your furniture around. If any of your floors are uneven, fix them. If there are any pets around you, be aware of where they are. Review your medicines with your doctor. Some medicines can make you feel dizzy. This can increase your chance of falling. Ask your doctor what other things that you can do to help prevent falls. This information is not intended to replace advice given to you by your health care provider. Make sure you discuss any questions you have with your health care provider. Document Released: 09/19/2009 Document Revised: 04/30/2016 Document Reviewed: 12/28/2014 Elsevier Interactive Patient Education  2017 [employer].

## 2022-12-21 NOTE — Telephone Encounter (Signed)
Immunization History  Administered Date(s) Administered   Fluad Quad(high Dose 65+) 08/15/2020, 08/12/2022   Influenza Split 09/09/2011, 10/10/2012   Influenza Whole 09/20/2006   Influenza, High Dose Seasonal PF 08/15/2019   Influenza,inj,Quad PF,6+ Mos 09/18/2015, 09/16/2016, 09/13/2017   PFIZER(Purple Top)SARS-COV-2 Vaccination 01/25/2020, 02/01/2020   PPD Test 12/27/2015   Pneumococcal Conjugate-13 09/18/2015   Pneumococcal Polysaccharide-23 11/23/2016   Td 12/26/1998, 09/13/2017

## 2023-01-05 ENCOUNTER — Other Ambulatory Visit: Payer: Self-pay | Admitting: Family Medicine

## 2023-01-05 NOTE — Telephone Encounter (Signed)
Last office visit 08/12/22 for 6 month follow up.  Last refilled 07/27/22 for #30 with 5 refills.  CPE scheduled 02/10/2023.

## 2023-01-06 ENCOUNTER — Ambulatory Visit (INDEPENDENT_AMBULATORY_CARE_PROVIDER_SITE_OTHER): Payer: Medicare Other | Admitting: Podiatry

## 2023-01-06 ENCOUNTER — Telehealth: Payer: Self-pay | Admitting: Family Medicine

## 2023-01-06 ENCOUNTER — Encounter: Payer: Self-pay | Admitting: Podiatry

## 2023-01-06 DIAGNOSIS — B351 Tinea unguium: Secondary | ICD-10-CM | POA: Diagnosis not present

## 2023-01-06 DIAGNOSIS — M79676 Pain in unspecified toe(s): Secondary | ICD-10-CM | POA: Diagnosis not present

## 2023-01-06 DIAGNOSIS — E1142 Type 2 diabetes mellitus with diabetic polyneuropathy: Secondary | ICD-10-CM

## 2023-01-06 NOTE — Progress Notes (Signed)
She presents today with her husband chief complaint of painful elongated toenails 1 through 5 bilaterally.  Objective: Pulses are palpable.  Nails are thick yellow dystrophic onychomycotic painful palpation.  Assessment: Pain in limb secondary onychomycosis.  Plan: Debridement of toenails 1 through 5 bilateral covered service secondary to pain.

## 2023-01-06 NOTE — Telephone Encounter (Signed)
LVM for pt to rtn my call to schedule AWV with [facility].  

## 2023-01-15 DIAGNOSIS — S022XXA Fracture of nasal bones, initial encounter for closed fracture: Secondary | ICD-10-CM | POA: Diagnosis not present

## 2023-01-15 DIAGNOSIS — J342 Deviated nasal septum: Secondary | ICD-10-CM | POA: Diagnosis not present

## 2023-01-15 DIAGNOSIS — R0981 Nasal congestion: Secondary | ICD-10-CM | POA: Diagnosis not present

## 2023-01-26 ENCOUNTER — Encounter: Payer: Self-pay | Admitting: Family Medicine

## 2023-01-27 ENCOUNTER — Other Ambulatory Visit: Payer: Self-pay | Admitting: Family Medicine

## 2023-01-27 ENCOUNTER — Other Ambulatory Visit: Payer: Self-pay | Admitting: Cardiovascular Disease

## 2023-01-27 DIAGNOSIS — Z79899 Other long term (current) drug therapy: Secondary | ICD-10-CM

## 2023-01-27 DIAGNOSIS — E559 Vitamin D deficiency, unspecified: Secondary | ICD-10-CM

## 2023-01-27 DIAGNOSIS — E1143 Type 2 diabetes mellitus with diabetic autonomic (poly)neuropathy: Secondary | ICD-10-CM

## 2023-01-27 DIAGNOSIS — E7800 Pure hypercholesterolemia, unspecified: Secondary | ICD-10-CM

## 2023-01-27 NOTE — Telephone Encounter (Signed)
Please schedule overdue 6 month F/U appt. Thank you!

## 2023-01-28 ENCOUNTER — Encounter: Payer: Self-pay | Admitting: Family Medicine

## 2023-01-28 ENCOUNTER — Ambulatory Visit (INDEPENDENT_AMBULATORY_CARE_PROVIDER_SITE_OTHER): Payer: Medicare Other | Admitting: Family Medicine

## 2023-01-28 VITALS — BP 100/60 | HR 82 | Temp 97.9°F | Ht 64.5 in | Wt 224.5 lb

## 2023-01-28 DIAGNOSIS — R413 Other amnesia: Secondary | ICD-10-CM

## 2023-01-28 DIAGNOSIS — R270 Ataxia, unspecified: Secondary | ICD-10-CM

## 2023-01-28 DIAGNOSIS — E559 Vitamin D deficiency, unspecified: Secondary | ICD-10-CM | POA: Diagnosis not present

## 2023-01-28 DIAGNOSIS — R296 Repeated falls: Secondary | ICD-10-CM

## 2023-01-28 DIAGNOSIS — E7800 Pure hypercholesterolemia, unspecified: Secondary | ICD-10-CM | POA: Diagnosis not present

## 2023-01-28 DIAGNOSIS — R269 Unspecified abnormalities of gait and mobility: Secondary | ICD-10-CM

## 2023-01-28 DIAGNOSIS — Z79899 Other long term (current) drug therapy: Secondary | ICD-10-CM | POA: Diagnosis not present

## 2023-01-28 DIAGNOSIS — E1143 Type 2 diabetes mellitus with diabetic autonomic (poly)neuropathy: Secondary | ICD-10-CM | POA: Diagnosis not present

## 2023-01-28 LAB — BASIC METABOLIC PANEL
BUN: 12 mg/dL (ref 6–23)
CO2: 28 mEq/L (ref 19–32)
Calcium: 9.6 mg/dL (ref 8.4–10.5)
Chloride: 105 mEq/L (ref 96–112)
Creatinine, Ser: 0.84 mg/dL (ref 0.40–1.20)
GFR: 69.29 mL/min (ref >60.00)
Glucose, Bld: 87 mg/dL (ref 70–99)
Potassium: 4.4 mEq/L (ref 3.5–5.1)
Sodium: 138 mEq/L (ref 135–145)

## 2023-01-28 LAB — HEPATIC FUNCTION PANEL
ALT: 20 U/L (ref 0–35)
AST: 25 U/L (ref 0–37)
Albumin: 4.2 g/dL (ref 3.5–5.2)
Alkaline Phosphatase: 42 U/L (ref 39–117)
Bilirubin, Direct: 0.1 mg/dL (ref 0.0–0.3)
Total Bilirubin: 0.6 mg/dL (ref 0.2–1.2)
Total Protein: 6.1 g/dL (ref 6.0–8.3)

## 2023-01-28 LAB — VITAMIN B12: Vitamin B-12: 237 pg/mL (ref 211–911)

## 2023-01-28 LAB — MICROALBUMIN / CREATININE URINE RATIO
Creatinine,U: 38.6 mg/dL
Microalb Creat Ratio: 8.7 mg/g (ref 0.0–30.0)
Microalb, Ur: 3.4 mg/dL — ABNORMAL HIGH (ref 0.0–1.9)

## 2023-01-28 LAB — CBC WITH DIFFERENTIAL/PLATELET
Basophils Absolute: 0 10*3/uL (ref 0.0–0.1)
Basophils Relative: 1.2 % (ref 0.0–3.0)
Eosinophils Absolute: 0.2 10*3/uL (ref 0.0–0.7)
Eosinophils Relative: 5.2 % — ABNORMAL HIGH (ref 0.0–5.0)
HCT: 36 % (ref 36.0–46.0)
Hemoglobin: 12.3 g/dL (ref 12.0–15.0)
Lymphocytes Relative: 28.3 % (ref 12.0–46.0)
Lymphs Abs: 1 10*3/uL (ref 0.7–4.0)
MCHC: 34.1 g/dL (ref 30.0–36.0)
MCV: 100.1 fl — ABNORMAL HIGH (ref 78.0–100.0)
Monocytes Absolute: 0.5 10*3/uL (ref 0.1–1.0)
Monocytes Relative: 14.4 % — ABNORMAL HIGH (ref 3.0–12.0)
Neutro Abs: 1.9 10*3/uL (ref 1.4–7.7)
Neutrophils Relative %: 50.9 % (ref 43.0–77.0)
Platelets: 163 10*3/uL (ref 150.0–400.0)
RBC: 3.59 Mil/uL — ABNORMAL LOW (ref 3.87–5.11)
RDW: 11.8 % (ref 11.5–15.5)
WBC: 3.7 10*3/uL — ABNORMAL LOW (ref 4.0–10.5)

## 2023-01-28 LAB — VITAMIN D 25 HYDROXY (VIT D DEFICIENCY, FRACTURES): VITD: 28.9 ng/mL — ABNORMAL LOW (ref 30.00–100.00)

## 2023-01-28 LAB — LIPID PANEL
Cholesterol: 138 mg/dL (ref 0–200)
HDL: 62.4 mg/dL (ref >39.00)
LDL Cholesterol: 57 mg/dL (ref 0–99)
NonHDL: 75.91
Total CHOL/HDL Ratio: 2
Triglycerides: 94 mg/dL (ref 0.0–149.0)
VLDL: 18.8 mg/dL (ref 0.0–40.0)

## 2023-01-28 LAB — TSH: TSH: 1.33 u[IU]/mL (ref 0.35–5.50)

## 2023-01-28 LAB — HEMOGLOBIN A1C: Hgb A1c MFr Bld: 6.4 % (ref 4.6–6.5)

## 2023-01-28 MED ORDER — DONEPEZIL HCL 5 MG PO TABS: 5.0000 mg | ORAL_TABLET | Freq: Every day | ORAL | 3 refills | Status: DC

## 2023-01-28 NOTE — Progress Notes (Signed)
Amanda Robinson T. [name], MD, [place] [facility] [place], [address]  Phone: [phone]  FAX: [place] - 72 y.o. female  MRN [id number]  Date of Birth: 26-Jul-1951  Date: 01/28/2023  PCP: Owens Loffler, MD  Referral: Owens Loffler, MD  Chief Complaint  Patient presents with   Memory Loss   Gait Problem   Subjective:   Amanda [name] is a 73 y.o. very pleasant female patient with Body mass index is 37.94 kg/m. who presents with the following:  She is a very pleasant 72 year old patient who is known quite well.  She presents today with family with some concerns of worsening memory and dementia.  They related a story via MyChart that she got confused about work, her husband's work, and where he was while she was awake a few days ago.  They have also noticed over time that she has become more and more forgetful and confused.  She did get lost trying to go home.  Recently, as well.  Does not really know what has been going on.  The last few weeks, when she went out of the house, she has gotten lost when she was driving home.  At night, she will get lost.  4-5 minutes will get lost.  Has happened at the day, too.    Had an episode the other night.  Will sometimes wake  up talking to a person, but then they will not be there.   Thought husband had come home from work, then was asking about where her kids are.     Falling and feels off balance, and she will stumble a lot.  Falling several times a month.  Falling against the furniture and the wall.  Over the last 2 months, her gait has deteriorated, she has become very much off balance and she is stumbling and falling with relatively frequency.  No history of prior stroke  ROMBERG Positive  Review of Systems is noted in the HPI, as appropriate  Patient Active Problem List   Diagnosis Date Noted   CAD (coronary artery disease), native  coronary artery 11/26/2020    Priority: High   COPD with chronic bronchitis 10/30/2016    Priority: High   Controlled type 2 diabetes mellitus with diabetic autonomic neuropathy, without long-term current use of insulin ([facility]) 08/10/2007    Priority: High   Nonrheumatic aortic valve stenosis     Priority: Medium    Smoker 05/10/2015    Priority: Medium    Essential hypertension     Priority: Medium    Pure hypercholesterolemia 12/07/2007    Priority: Medium    Major depressive disorder, recurrent episode, in partial remission ([facility]) 12/07/2007    Priority: Medium    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 2013: nl LV sys fxn, mild MR, no evidence of pulm htn; b. TTE 8/17: EF 60-65%, no RWMA, nl LV dia fxn,  mild AS, mod AI   CAD (coronary artery disease), native coronary artery 11/26/2020   Controlled type 2 diabetes mellitus with diabetic autonomic neuropathy, without long-term current use  of insulin ([facility]) 08/10/2007   Qualifier: Diagnosis of  By: Council Mechanic MD, Hilaria Ota    COPD (chronic obstructive pulmonary disease) ([facility]) 10/30/2016   Depression    Fracture in accidental fall    GAD (generalized anxiety disorder)    GERD (gastroesophageal reflux disease)    Hiatal hernia with gastroesophageal reflux 1997   Hyperlipidemia    Hypertension    Osteoporosis    Tobacco abuse     Past Surgical History:  Procedure Laterality Date   ABDOMINAL HYSTERECTOMY  1993   ANTERIOR CERVICAL DECOMP/DISCECTOMY FUSION N/A 10/26/2018   Procedure: ACDF C4-C5 C5-C6 C6-C7;  Surgeon: Kary Kos, MD;  Location: [facility];  Service: Neurosurgery;  Laterality: N/A;   APPENDECTOMY     BACK SURGERY     BREAST  BIOPSY Right 2007   benign   CHOLECYSTECTOMY     COLONOSCOPY     30 years ago was normal per pt.    DILATION AND CURETTAGE OF UTERUS     ESOPHAGEAL DILATION     x 3   ESOPHAGOGASTRODUODENOSCOPY (EGD) WITH PROPOFOL N/A 02/06/2022   Procedure: ESOPHAGOGASTRODUODENOSCOPY (EGD) WITH PROPOFOL;  Surgeon: Jonathon Bellows, MD;  Location[facility] ENDOSCOPY;  Service: Gastroenterology;  Laterality: N/A;   ESOPHAGOGASTRODUODENOSCOPY (EGD) WITH PROPOFOL N/A 02/26/2022   Procedure: ESOPHAGOGASTRODUODENOSCOPY (EGD) WITH PROPOFOL;  Surgeon: Jonathon Bellows, MD;  Location: [facility] ENDOSCOPY;  Service: Gastroenterology;  Laterality: N/A;  EGD+dilation per Dr. Vicente Males   fractured leg Right    INCISION AND DRAINAGE Right 12/02/2021   Procedure: INCISION AND DRAINAGE;  Surgeon: Hessie Knows, MD;  Location: [facility] ORS;  Service: Orthopedics;  Laterality: Right;   LEFT HEART CATH AND CORONARY ANGIOGRAPHY N/A 11/11/2020   Procedure: LEFT HEART CATH AND CORONARY ANGIOGRAPHY;  Surgeon: Wellington Hampshire, MD;  Location: [facility] CV LAB;  Service: Cardiovascular;  Laterality: N/A;   OOPHORECTOMY     OVARIAN CYST REMOVAL  1972   SALIVARY GLAND SURGERY     TONSILLECTOMY     4 yoa   TOTAL KNEE ARTHROPLASTY Left 12/24/2015   Procedure: LEFT TOTAL KNEE ARTHROPLASTY;  Surgeon: [name], MD;  Location: [facility];  Service: Orthopedics;  Laterality: Left;   TOTAL KNEE ARTHROPLASTY Right 12/30/2017   Procedure: RIGHT TOTAL KNEE ARTHROPLASTY;  Surgeon: [name], MD;  Location: [facility];  Service: Orthopedics;  Laterality: Right;   WRIST FRACTURE SURGERY  11/2008    Family History  Problem Relation Age of Onset   Hyperlipidemia Mother    Arthritis Mother    Diabetes Mother    Heart disease Mother    Colon polyps Mother    Cancer Paternal Grandmother        breast   Hypercholesterolemia Sister    Colon polyps Sister    Breast cancer Sister    Colon cancer Neg Hx    Esophageal cancer Neg Hx    Rectal cancer Neg Hx     Stomach cancer Neg Hx    Pancreatic cancer Neg Hx     Social History   Social History Narrative   Not on file     Objective:   BP 100/60   Pulse 82   Temp 97.9 F (36.6 C) (Temporal)   Ht 5' 4.5" (1.638 m)   Wt 224 lb 8 oz (101.8 kg)   SpO2 95%   BMI 37.94 kg/m   GEN: No acute distress; alert,appropriate. PULM: Breathing comfortably in no respiratory distress  What is the (year) (season) (date) (day) (month)  3 /5  What is the (state) (country) (town) (hospital) (floor) 5/5  Remember [place], [place], [place] - repeat them back to me 0/3  Spell W-O-R-L-D backwards?  5/5  What are the 3 objects I gave you 2/3  Language  Name pen - watch 2/2  Repeat No ifs, ands or buts? 1/1  Take a paper in your hand, fold it in half and put it on the floor.  3/3  Read and obey the following:  CLOSE YOUR EYES  1/1  Write a sentence 1/1  Copy the design 0/1  Total 23/30   Neuro: CN 2-12 grossly intact. PERRLA. EOMI. Sensation intact throughout. Str 5/5 all extremities. DTR 2+. No clonus. A and o x 4. Romberg POS. unable to do additional balance testing, given imbalance and risk of fall.  PSYCH: Normally interactive. Conversant. Not depressed or anxious appearing.  Calm demeanor.      Laboratory and Imaging Data: Results for orders placed or performed in visit on 01/28/23  RPR  Result Value Ref Range   RPR Ser Ql NON-REACTIVE NON-REACTIVE  Vitamin B12  Result Value Ref Range   Vitamin B-12 237 211 - 911 pg/mL  VITAMIN D 25 Hydroxy (Vit-D Deficiency, Fractures)  Result Value Ref Range   VITD 28.90 (L) 30.00 - 100.00 ng/mL  Lipid panel  Result Value Ref Range   Cholesterol 138 0 - 200 mg/dL   Triglycerides 94.0 0.0 - 149.0 mg/dL   HDL 62.40 >39.00 mg/dL   VLDL 18.8 0.0 - 40.0 mg/dL   LDL Cholesterol 57 0 - 99 mg/dL   Total CHOL/HDL Ratio 2    NonHDL 75.91   TSH  Result Value Ref Range   TSH 1.33 0.35 - 5.50 uIU/mL  Microalbumin / creatinine urine ratio  Result  Value Ref Range   Microalb, Ur 3.4 (H) 0.0 - 1.9 mg/dL   Creatinine,U 38.6 mg/dL   Microalb Creat Ratio 8.7 0.0 - 30.0 mg/g  Hemoglobin A1c  Result Value Ref Range   Hgb A1c MFr Bld 6.4 4.6 - 6.5 %  Hepatic function panel  Result Value Ref Range   Total Bilirubin 0.6 0.2 - 1.2 mg/dL   Bilirubin, Direct 0.1 0.0 - 0.3 mg/dL   Alkaline Phosphatase 42 39 - 117 U/L   AST 25 0 - 37 U/L   ALT 20 0 - 35 U/L   Total Protein 6.1 6.0 - 8.3 g/dL   Albumin 4.2 3.5 - 5.2 g/dL  CBC with Differential/Platelet  Result Value Ref Range   WBC 3.7 (L) 4.0 - 10.5 K/uL   RBC 3.59 (L) 3.87 - 5.11 Mil/uL   Hemoglobin 12.3 12.0 - 15.0 g/dL   HCT 36.0 36.0 - 46.0 %   MCV 100.1 (H) 78.0 - 100.0 fl   MCHC 34.1 30.0 - 36.0 g/dL   RDW 11.8 11.5 - 15.5 %   Platelets 163.0 150.0 - 400.0 K/uL   Neutrophils Relative % 50.9 43.0 - 77.0 %   Lymphocytes Relative 28.3 12.0 - 46.0 %   Monocytes Relative 14.4 (H) 3.0 - 12.0 %   Eosinophils Relative 5.2 (H) 0.0 - 5.0 %   Basophils Relative 1.2 0.0 - 3.0 %   Neutro Abs 1.9 1.4 - 7.7 K/uL   Lymphs Abs 1.0 0.7 - 4.0 K/uL   Monocytes Absolute 0.5 0.1 - 1.0 K/uL   Eosinophils Absolute 0.2 0.0 - 0.7 K/uL   Basophils Absolute 0.0 0.0 - 0.1 K/uL  Basic metabolic panel  Result Value Ref  Range   Sodium 138 135 - 145 mEq/L   Potassium 4.4 3.5 - 5.1 mEq/L   Chloride 105 96 - 112 mEq/L   CO2 28 19 - 32 mEq/L   Glucose, Bld 87 70 - 99 mg/dL   BUN 12 6 - 23 mg/dL   Creatinine, Ser 0.84 0.40 - 1.20 mg/dL   GFR 69.29 >60.00 mL/min   Calcium 9.6 8.4 - 10.5 mg/dL     Assessment and Plan:     ICD-10-CM   1. Memory loss  R41.3 MR Brain Wo Contrast    RPR    Vitamin B12    Ambulatory referral to Neurology    2. Ataxia  R27.0 MR Brain Wo Contrast    Vitamin B12    Ambulatory referral to Neurology    3. Gait disturbance  R26.9 MR Brain Wo Contrast    Ambulatory referral to Neurology    4. Frequent falls  R29.6 MR Brain Wo Contrast    Ambulatory referral to Neurology     5. Vitamin D deficiency  E55.9 VITAMIN D 25 Hydroxy (Vit-D Deficiency, Fractures)    6. Pure hypercholesterolemia  E78.00 Lipid panel    7. Encounter for long-term (current) use of medications  Z79.899 TSH    Hepatic function panel    CBC with Differential/Platelet    Basic metabolic panel    8. Controlled type 2 diabetes mellitus with diabetic autonomic neuropathy, without long-term current use of insulin (H[facility])  E11.43 Microalbumin / creatinine urine ratio    Hemoglobin A1c     Total encounter time: 40 minutes. This includes total time spent on the day of encounter.  Time spent on long conversation with the patient, family, and chart review.  With relatively acute worsening in the last 2 months of imbalance, gait disturbance, fall, staggering, as well as worsening cognitive state, obtain an MRI of the brain without contrast to evaluate for stroke.  I am going to go ahead and start the patient on Aricept, and in a best case scenario this will be more likely some mild to moderate dementia that is progressing, but I think vascular causes need to be ruled out first.  Medication Management during today's office visit: Meds ordered this encounter  Medications   donepezil (ARICEPT) 5 MG tablet    Sig: Take 1 tablet (5 mg total) by mouth at bedtime.    Dispense:  30 tablet    Refill:  3   There are no discontinued medications.  Orders placed today for conditions managed today: Orders Placed This Encounter  Procedures   MR Brain Wo Contrast   RPR   Vitamin B12   Ambulatory referral to Neurology    Disposition: No follow-ups on file.  [employer] One speech-to-text software was used for transcription in this dictation.  Possible transcriptional errors can occur using Editor, commissioning.   Signed,  Maud Deed. Alcee Sipos, MD   Outpatient Encounter Medications as of 01/28/2023  Medication Sig   acetaminophen (TYLENOL) 650 MG CR tablet Take 650 mg by mouth 3 times/day as  needed-between meals & bedtime (for headaches).   albuterol (VENTOLIN HFA) 108 (90 Base) MCG/ACT inhaler Inhale 2 puffs into the lungs every 4 (four) hours as needed for wheezing or shortness of breath.   amitriptyline (ELAVIL) 50 MG tablet TAKE 2 TABLETS EVERY DAY AT BEDTIME   Ascorbic Acid (VITAMIN C) 1000 MG tablet Take 1,000 mg by mouth daily.   aspirin EC 81 MG tablet Take 81 mg by  mouth daily.   buPROPion (WELLBUTRIN XL) 150 MG 24 hr tablet TAKE 1 TABLET EVERY DAY   calcium carbonate (TUMS - DOSED IN MG ELEMENTAL CALCIUM) 500 MG chewable tablet Chew 2 tablets by mouth daily as needed for indigestion or heartburn.   Cholecalciferol (VITAMIN D) 1000 UNITS capsule Take 1,000 Units by mouth daily.    clonazePAM (KLONOPIN) 0.5 MG tablet Take 1 tablet by mouth twice daily as needed   cyclobenzaprine (FLEXERIL) 10 MG tablet Take 1 tablet by mouth three times daily as needed for muscle spasm   dimenhyDRINATE (DRAMAMINE) 50 MG tablet Take 50 mg by mouth every 8 (eight) hours as needed.   donepezil (ARICEPT) 5 MG tablet Take 1 tablet (5 mg total) by mouth at bedtime.   ezetimibe (ZETIA) 10 MG tablet TAKE 1 TABLET EVERY DAY   FLUoxetine (PROZAC) 40 MG capsule TAKE 2 CAPSULES EVERY DAY   gabapentin (NEURONTIN) 100 MG capsule Take 1 capsule (100 mg total) by mouth 2 (two) times daily.   guaiFENesin (MUCINEX) 600 MG 12 hr tablet Take 600 mg by mouth daily.   metoprolol tartrate (LOPRESSOR) 25 MG tablet TAKE 1/2 TABLET TWICE DAILY   Multiple Vitamin (MULTIVITAMIN) tablet Take 1 tablet by mouth daily.   naproxen sodium (ALEVE) 220 MG tablet Take 220 mg by mouth daily.   ondansetron (ZOFRAN-ODT) 4 MG disintegrating tablet Take 1 tablet (4 mg total) by mouth every 8 (eight) hours as needed for nausea or vomiting.   pantoprazole (PROTONIX) 40 MG tablet TAKE 1 TABLET 30 MINUTES PRIOR TO BREAKFAST   rosuvastatin (CRESTOR) 40 MG tablet TAKE 1 TABLET EVERY DAY   Simethicone 125 MG CAPS Take 125 mg by mouth  daily as needed (gas).   vitamin E 180 MG (400 UNITS) capsule Take 400 Units by mouth daily.   No facility-administered encounter medications on file as of 01/28/2023.

## 2023-01-29 ENCOUNTER — Encounter: Payer: Self-pay | Admitting: *Deleted

## 2023-01-29 LAB — RPR: RPR Ser Ql: NONREACTIVE

## 2023-02-01 ENCOUNTER — Encounter: Payer: Self-pay | Admitting: *Deleted

## 2023-02-01 ENCOUNTER — Encounter: Payer: Self-pay | Admitting: Family Medicine

## 2023-02-03 ENCOUNTER — Other Ambulatory Visit: Payer: Medicare Other

## 2023-02-03 ENCOUNTER — Ambulatory Visit
Admission: RE | Admit: 2023-02-03 | Discharge: 2023-02-03 | Disposition: A | Discharge status: Home / Self Care | Payer: Medicare Other | Source: Ambulatory Visit | Attending: Family Medicine | Admitting: Family Medicine

## 2023-02-03 DIAGNOSIS — R270 Ataxia, unspecified: Secondary | ICD-10-CM | POA: Diagnosis not present

## 2023-02-03 DIAGNOSIS — R269 Unspecified abnormalities of gait and mobility: Secondary | ICD-10-CM | POA: Diagnosis not present

## 2023-02-03 DIAGNOSIS — R413 Other amnesia: Secondary | ICD-10-CM | POA: Diagnosis not present

## 2023-02-03 DIAGNOSIS — R296 Repeated falls: Secondary | ICD-10-CM | POA: Insufficient documentation

## 2023-02-03 DIAGNOSIS — R4182 Altered mental status, unspecified: Secondary | ICD-10-CM | POA: Diagnosis not present

## 2023-02-03 DIAGNOSIS — R519 Headache, unspecified: Secondary | ICD-10-CM | POA: Diagnosis not present

## 2023-02-04 ENCOUNTER — Encounter: Payer: Self-pay | Admitting: Family Medicine

## 2023-02-05 ENCOUNTER — Encounter: Payer: Self-pay | Admitting: *Deleted

## 2023-02-05 ENCOUNTER — Other Ambulatory Visit: Payer: Self-pay | Admitting: Family Medicine

## 2023-02-05 DIAGNOSIS — R9389 Abnormal findings on diagnostic imaging of other specified body structures: Secondary | ICD-10-CM

## 2023-02-05 DIAGNOSIS — K118 Other diseases of salivary glands: Secondary | ICD-10-CM

## 2023-02-10 ENCOUNTER — Ambulatory Visit (INDEPENDENT_AMBULATORY_CARE_PROVIDER_SITE_OTHER): Payer: Medicare Other | Admitting: Family Medicine

## 2023-02-10 VITALS — BP 132/60 | HR 84 | Temp 98.0°F | Ht 65.5 in | Wt 225.5 lb

## 2023-02-10 DIAGNOSIS — I2510 Atherosclerotic heart disease of native coronary artery without angina pectoris: Secondary | ICD-10-CM | POA: Diagnosis not present

## 2023-02-10 DIAGNOSIS — E7800 Pure hypercholesterolemia, unspecified: Secondary | ICD-10-CM

## 2023-02-10 DIAGNOSIS — F17200 Nicotine dependence, unspecified, uncomplicated: Secondary | ICD-10-CM | POA: Diagnosis not present

## 2023-02-10 DIAGNOSIS — I10 Essential (primary) hypertension: Secondary | ICD-10-CM | POA: Diagnosis not present

## 2023-02-10 DIAGNOSIS — J4489 Other specified chronic obstructive pulmonary disease: Secondary | ICD-10-CM | POA: Diagnosis not present

## 2023-02-10 DIAGNOSIS — E1143 Type 2 diabetes mellitus with diabetic autonomic (poly)neuropathy: Secondary | ICD-10-CM

## 2023-02-10 MED ORDER — TRAZODONE HCL 100 MG PO TABS: 100.0000 mg | ORAL_TABLET | Freq: Every day | ORAL | 1 refills | Status: DC

## 2023-02-10 NOTE — Patient Instructions (Addendum)
DO NOT DRIVE AT NIGHT  PLEASE USE YOUR CANE WHEN MOVING AROUND  Set an alarm for the morning and evening medications - on phone  Add trazodone for sleep - take about 30 minutes before sleep  Vitamin D: 2,000 units each night - buy another bottle  Covid-19 booster

## 2023-02-10 NOTE — Progress Notes (Signed)
Deya Bigos T. Lamar Naef, MD, [place] at [facility] [place], [address]  Phone: [phone]  FAX: [place] - 72 y.o. female  MRN [id number]  Date of Birth: 27-Aug-1951  Date: 02/10/2023  PCP: Owens Loffler, MD  Referral: Owens Loffler, MD  Chief Complaint  Patient presents with   Annual Exam    Part 2   Subjective:   Amanda [name] is a 72 y.o. very pleasant female patient who presents with the following:  She is here for chronic medical management follow-up after her Medicare wellness exam done in early January.  Smoking?  None since Valentine's day, none at all.   Lung cancer screen Eye exam - eyes with wiggly lines Covid booster  Fallen 3 times in the last month.    Diabetes Mellitus: Tolerating Medications: yes Compliance with diet: fair, Body mass index is 36.95 kg/m. Exercise: minimal / intermittent Avg blood sugars at home: not checking Foot problems: none Hypoglycemia: none No nausea, vomitting, blurred vision, polyuria.  Lab Results  Component Value Date   HGBA1C 6.4 01/28/2023   HGBA1C 6.1 (A) 08/12/2022   HGBA1C 6.4 (H) 11/26/2021   Lab Results  Component Value Date   MICROALBUR 3.4 (H) 01/28/2023   LDLCALC 57 01/28/2023   CREATININE 0.84 01/28/2023    Wt Readings from Last 3 Encounters:  02/10/23 225 lb 8 oz (102.3 kg)  01/28/23 224 lb 8 oz (101.8 kg)  12/18/22 219 lb (99.3 kg)    HTN: Tolerating all medications without side effects Stable and at goal No CP, no sob. No HA.  BP Readings from Last 3 Encounters:  02/10/23 132/60  01/28/23 100/60  08/12/22 [id number]    Basic Metabolic Panel:    Component Value Date/Time   NA 138 01/28/2023 1158   K 4.4 01/28/2023 1158   CL 105 01/28/2023 1158   CO2 28 01/28/2023 1158   BUN 12 01/28/2023 1158   CREATININE 0.84 01/28/2023 1158   GLUCOSE 87 01/28/2023 1158   CALCIUM 9.6 01/28/2023 1158    Lipids:  Doing well, stable. Tolerating meds fine with no SE. Panel reviewed with patient.  Lipids: Lab Results  Component Value Date   CHOL 138 01/28/2023   Lab Results  Component Value Date   HDL 62.40 01/28/2023   Lab Results  Component Value Date   LDLCALC 57 01/28/2023   Lab Results  Component Value Date   TRIG 94.0 01/28/2023   Lab Results  Component Value Date   CHOLHDL 2 01/28/2023    Lab Results  Component Value Date   ALT 20 01/28/2023   AST 25 01/28/2023   ALKPHOS 42 01/28/2023   BILITOT 0.6 01/28/2023     Health Maintenance  Topic Date Due   DEXA SCAN  Never done   Lung Cancer Screening  09/03/2019   COVID-19 Vaccine (3 - Pfizer risk series) 02/29/2020   OPHTHALMOLOGY EXAM  07/30/2020   Zoster Vaccines- Shingrix (1 of 2) 03/19/2023 (Originally 05/18/1970)   HEMOGLOBIN A1C  07/29/2023   FOOT EXAM  10/08/2023   Medicare Annual Wellness (AWV)  12/19/2023   Diabetic kidney evaluation - eGFR measurement  01/29/2024   Diabetic kidney evaluation - Urine ACR  01/29/2024   MAMMOGRAM  09/16/2024   COLONOSCOPY (Pts 45-91yr Insurance coverage will need to be confirmed)  11/19/2025   DTaP/Tdap/Td (3 - Tdap) 09/14/2027   Pneumonia Vaccine 72 Years old  Completed   INFLUENZA  VACCINE  Completed   Hepatitis C Screening  Completed   HPV VACCINES  Aged Out    Immunization History  Administered Date(s) Administered   Fluad Quad(high Dose 65+) 08/15/2020, 08/12/2022   Influenza Split 09/09/2011, 10/10/2012   Influenza Whole 09/20/2006   Influenza, High Dose Seasonal PF 08/15/2019   Influenza,inj,Quad PF,6+ Mos 09/18/2015, 09/16/2016, 09/13/2017   PFIZER(Purple Top)SARS-COV-2 Vaccination 01/25/2020, 02/01/2020   PPD Test 12/27/2015   Pneumococcal Conjugate-13 09/18/2015   Pneumococcal Polysaccharide-23 11/23/2016   Td 12/26/1998, 09/13/2017     Review of Systems is noted in the HPI, as appropriate  Patient Active Problem List   Diagnosis Date Noted   CAD (coronary  artery disease), native coronary artery 11/26/2020    Priority: High   COPD with chronic bronchitis 10/30/2016    Priority: High   Controlled type 2 diabetes mellitus with diabetic autonomic neuropathy, without long-term current use of insulin ([place]) 08/10/2007    Priority: High   Nonrheumatic aortic valve stenosis     Priority: Medium    Smoker 05/10/2015    Priority: Medium    Essential hypertension     Priority: Medium    Pure hypercholesterolemia 12/07/2007    Priority: Medium    Major depressive disorder, recurrent episode, in partial remission ([place]) 12/07/2007    Priority: Medium    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 2013: nl LV sys fxn, mild MR, no evidence of pulm htn; b. TTE 8/17: EF 60-65%, no RWMA, nl LV dia fxn,  mild AS, mod AI   CAD (coronary artery disease), native coronary artery 11/26/2020   Controlled type 2 diabetes mellitus with diabetic autonomic neuropathy, without long-term current use of insulin ([place]) 08/10/2007   Qualifier: Diagnosis of  By: Council Mechanic MD, Hilaria Ota    COPD (chronic obstructive pulmonary disease) ([place]) 10/30/2016   Depression    Fracture in accidental fall    GAD (generalized anxiety disorder)    GERD (gastroesophageal reflux disease)    Hiatal hernia with gastroesophageal reflux 1997   Hyperlipidemia    Hypertension    Osteoporosis    Tobacco abuse     Past Surgical History:  Procedure Laterality Date   ABDOMINAL HYSTERECTOMY  1993   ANTERIOR CERVICAL DECOMP/DISCECTOMY FUSION N/A 10/26/2018   Procedure: ACDF C4-C5 C5-C6 C6-C7;  Surgeon: Kary Kos, MD;  Location: [facility];  Service: Neurosurgery;  Laterality: N/A;   APPENDECTOMY     BACK  SURGERY     BREAST BIOPSY Right 2007   benign   CHOLECYSTECTOMY     COLONOSCOPY     30 years ago was normal per pt.    DILATION AND CURETTAGE OF UTERUS     ESOPHAGEAL DILATION     x 3   ESOPHAGOGASTRODUODENOSCOPY (EGD) WITH PROPOFOL N/A 02/06/2022   Procedure: ESOPHAGOGASTRODUODENOSCOPY (EGD) WITH PROPOFOL;  Surgeon: Jonathon Bellows, MD;  Location: [facility] ENDOSCOPY;  Service: Gastroenterology;  Laterality: N/A;   ESOPHAGOGASTRODUODENOSCOPY (EGD) WITH PROPOFOL N/A 02/26/2022   Procedure: ESOPHAGOGASTRODUODENOSCOPY (EGD) WITH PROPOFOL;  Surgeon: Jonathon Bellows, MD;  Location: [facility] ENDOSCOPY;  Service: Gastroenterology;  Laterality: N/A;  EGD+dilation per Dr. Vicente Males   fractured leg Right  INCISION AND DRAINAGE Right 12/02/2021   Procedure: INCISION AND DRAINAGE;  Surgeon: Hessie Knows, MD;  Location: [facility] ORS;  Service: Orthopedics;  Laterality: Right;   LEFT HEART CATH AND CORONARY ANGIOGRAPHY N/A 11/11/2020   Procedure: LEFT HEART CATH AND CORONARY ANGIOGRAPHY;  Surgeon: Wellington Hampshire, MD;  Location: [facility];  Service: Cardiovascular;  Laterality: N/A;   OOPHORECTOMY     OVARIAN CYST REMOVAL  1972   SALIVARY GLAND SURGERY     TONSILLECTOMY     4 yoa   TOTAL KNEE ARTHROPLASTY Left 12/24/2015   Procedure: LEFT TOTAL KNEE ARTHROPLASTY;  Surgeon: [name], MD;  Location: [place];  Service: Orthopedics;  Laterality: Left;   TOTAL KNEE ARTHROPLASTY Right 12/30/2017   Procedure: RIGHT TOTAL KNEE ARTHROPLASTY;  Surgeon: [name], MD;  Location: [place];  Service: Orthopedics;  Laterality: Right;   WRIST FRACTURE SURGERY  11/2008    Family History  Problem Relation Age of Onset   Hyperlipidemia Mother    Arthritis Mother    Diabetes Mother    Heart disease Mother    Colon polyps Mother    Cancer Paternal Grandmother        breast   Hypercholesterolemia Sister    Colon polyps Sister    Breast cancer Sister    Colon cancer Neg Hx    Esophageal cancer Neg Hx     Rectal cancer Neg Hx    Stomach cancer Neg Hx    Pancreatic cancer Neg Hx     Social History   Social History Narrative   Not on file     Objective:   BP 132/60   Pulse 84   Temp 98 F (36.7 C) (Temporal)   Ht 5' 5.5" (1.664 m)   Wt 225 lb 8 oz (102.3 kg)   SpO2 96%   BMI 36.95 kg/m   GEN: WDWN, NAD, Non-toxic HEENT: Atraumatic, Normocephalic. Neck supple. No masses. CV: RRR, No M/G/R. No JVD. No thrill. No extra heart sounds. PULM: CTA B, no wheezes, crackles, rhonchi. No retractions. No resp. distress. No accessory muscle use. EXTR: No c/c/e NEURO Normal gait.  PSYCH: Normally interactive. Conversant.   Laboratory and Imaging Data: Results for orders placed or performed in visit on 01/28/23  RPR  Result Value Ref Range   RPR Ser Ql NON-REACTIVE NON-REACTIVE  Vitamin B12  Result Value Ref Range   Vitamin B-12 237 211 - 911 pg/mL  VITAMIN D 25 Hydroxy (Vit-D Deficiency, Fractures)  Result Value Ref Range   VITD 28.90 (L) 30.00 - 100.00 ng/mL  Lipid panel  Result Value Ref Range   Cholesterol 138 0 - 200 mg/dL   Triglycerides 94.0 0.0 - 149.0 mg/dL   HDL 62.40 >39.00 mg/dL   VLDL 18.8 0.0 - 40.0 mg/dL   LDL Cholesterol 57 0 - 99 mg/dL   Total CHOL/HDL Ratio 2    NonHDL 75.91   TSH  Result Value Ref Range   TSH 1.33 0.35 - 5.50 uIU/mL  Microalbumin / creatinine urine ratio  Result Value Ref Range   Microalb, Ur 3.4 (H) 0.0 - 1.9 mg/dL   Creatinine,U 38.6 mg/dL   Microalb Creat Ratio 8.7 0.0 - 30.0 mg/g  Hemoglobin A1c  Result Value Ref Range   Hgb A1c MFr Bld 6.4 4.6 - 6.5 %  Hepatic function panel  Result Value Ref Range   Total Bilirubin 0.6 0.2 - 1.2 mg/dL   Bilirubin, Direct 0.1 0.0 - 0.3 mg/dL  Alkaline Phosphatase 42 39 - 117 U/L   AST 25 0 - 37 U/L   ALT 20 0 - 35 U/L   Total Protein 6.1 6.0 - 8.3 g/dL   Albumin 4.2 3.5 - 5.2 g/dL  CBC with Differential/Platelet  Result Value Ref Range   WBC 3.7 (L) 4.0 - 10.5 K/uL   RBC 3.59 (L) 3.87 -  5.11 Mil/uL   Hemoglobin 12.3 12.0 - 15.0 g/dL   HCT 36.0 36.0 - 46.0 %   MCV 100.1 (H) 78.0 - 100.0 fl   MCHC 34.1 30.0 - 36.0 g/dL   RDW 11.8 11.5 - 15.5 %   Platelets 163.0 150.0 - 400.0 K/uL   Neutrophils Relative % 50.9 43.0 - 77.0 %   Lymphocytes Relative 28.3 12.0 - 46.0 %   Monocytes Relative 14.4 (H) 3.0 - 12.0 %   Eosinophils Relative 5.2 (H) 0.0 - 5.0 %   Basophils Relative 1.2 0.0 - 3.0 %   Neutro Abs 1.9 1.4 - 7.7 K/uL   Lymphs Abs 1.0 0.7 - 4.0 K/uL   Monocytes Absolute 0.5 0.1 - 1.0 K/uL   Eosinophils Absolute 0.2 0.0 - 0.7 K/uL   Basophils Absolute 0.0 0.0 - 0.1 K/uL  Basic metabolic panel  Result Value Ref Range   Sodium 138 135 - 145 mEq/L   Potassium 4.4 3.5 - 5.1 mEq/L   Chloride 105 96 - 112 mEq/L   CO2 28 19 - 32 mEq/L   Glucose, Bld 87 70 - 99 mg/dL   BUN 12 6 - 23 mg/dL   Creatinine, Ser 0.84 0.40 - 1.20 mg/dL   GFR 69.29 >60.00 mL/min   Calcium 9.6 8.4 - 10.5 mg/dL     Assessment and Plan:     ICD-10-CM   1. Controlled type 2 diabetes mellitus with diabetic autonomic neuropathy, without long-term current use of insulin (HCC)  E11.43     2. COPD with chronic bronchitis  J44.89     3. Essential hypertension  I10     4. Pure hypercholesterolemia  E78.00     5. Coronary artery disease involving native coronary artery of native heart without angina pectoris  I25.10     6. Smoker  F17.200 Ambulatory Referral Lung Cancer Screening [facility]     Total encounter time: 40 minutes. This includes total time spent on the day of encounter.  Long conversation with family and patient, ongoing dementia that was fairly recently diagnosed and recognized.  Diabetes is under good control.  COPD is stable without any long-term dysfunction right now, and thankfully she is quit smoking.  She has been smoking for more than 50 years at least 1 pack/day, and I am going to set her up for lung cancer screening.  Cholesterol is stable.  Ongoing dementia.  I  had a long conversation with the patient and her daughter who weeks ago, and also had some conversations about this today with her son.  She is currently on Aricept.  She also has a right-sided parotid mass, and she has upcoming ENT evaluation.  This was seen on MRI of the brain, and was concerning for either complex cyst or possible neoplasm.  Medication Management during today's office visit: Meds ordered this encounter  Medications   traZODone (DESYREL) 100 MG tablet    Sig: Take 1 tablet (100 mg total) by mouth at bedtime.    Dispense:  90 tablet    Refill:  1   There are no discontinued medications.  Orders  placed today for conditions managed today: Orders Placed This Encounter  Procedures   Ambulatory Referral Lung Cancer Screening [facility]    Disposition: No follow-ups on file.  [employer] speech-to-text software was used for transcription in this dictation.  Possible transcriptional errors can occur using Editor, commissioning.   Signed,  Maud Deed. Malaka Ruffner, MD   Outpatient Encounter Medications as of 02/10/2023  Medication Sig   acetaminophen (TYLENOL) 650 MG CR tablet Take 650 mg by mouth 3 times/day as needed-between meals & bedtime (for headaches).   albuterol (VENTOLIN HFA) 108 (90 Base) MCG/ACT inhaler Inhale 2 puffs into the lungs every 4 (four) hours as needed for wheezing or shortness of breath.   amitriptyline (ELAVIL) 50 MG tablet TAKE 2 TABLETS EVERY DAY AT BEDTIME   Ascorbic Acid (VITAMIN C) 1000 MG tablet Take 1,000 mg by mouth daily.   aspirin EC 81 MG tablet Take 81 mg by mouth daily.   buPROPion (WELLBUTRIN XL) 150 MG 24 hr tablet TAKE 1 TABLET EVERY DAY   calcium carbonate (TUMS - DOSED IN MG ELEMENTAL CALCIUM) 500 MG chewable tablet Chew 2 tablets by mouth daily as needed for indigestion or heartburn.   Cholecalciferol (VITAMIN D) 1000 UNITS capsule Take 1,000 Units by mouth daily.    clonazePAM (KLONOPIN) 0.5 MG tablet Take 1 tablet by mouth  twice daily as needed   cyclobenzaprine (FLEXERIL) 10 MG tablet Take 1 tablet by mouth three times daily as needed for muscle spasm   dimenhyDRINATE (DRAMAMINE) 50 MG tablet Take 50 mg by mouth every 8 (eight) hours as needed.   donepezil (ARICEPT) 5 MG tablet Take 1 tablet (5 mg total) by mouth at bedtime.   ezetimibe (ZETIA) 10 MG tablet TAKE 1 TABLET EVERY DAY   FLUoxetine (PROZAC) 40 MG capsule TAKE 2 CAPSULES EVERY DAY   fluticasone (FLONASE) 50 MCG/ACT nasal spray Place 2 sprays into both nostrils daily.   gabapentin (NEURONTIN) 100 MG capsule Take 1 capsule (100 mg total) by mouth 2 (two) times daily.   guaiFENesin (MUCINEX) 600 MG 12 hr tablet Take 600 mg by mouth daily.   metoprolol tartrate (LOPRESSOR) 25 MG tablet TAKE 1/2 TABLET TWICE DAILY   Multiple Vitamin (MULTIVITAMIN) tablet Take 1 tablet by mouth daily.   naproxen sodium (ALEVE) 220 MG tablet Take 220 mg by mouth daily.   ondansetron (ZOFRAN-ODT) 4 MG disintegrating tablet Take 1 tablet (4 mg total) by mouth every 8 (eight) hours as needed for nausea or vomiting.   pantoprazole (PROTONIX) 40 MG tablet TAKE 1 TABLET 30 MINUTES PRIOR TO BREAKFAST   rosuvastatin (CRESTOR) 40 MG tablet TAKE 1 TABLET EVERY DAY   Simethicone 125 MG CAPS Take 125 mg by mouth daily as needed (gas).   traZODone (DESYREL) 100 MG tablet Take 1 tablet (100 mg total) by mouth at bedtime.   vitamin E 180 MG (400 UNITS) capsule Take 400 Units by mouth daily.   No facility-administered encounter medications on file as of 02/10/2023.

## 2023-02-13 ENCOUNTER — Encounter: Payer: Self-pay | Admitting: Family Medicine

## 2023-02-15 ENCOUNTER — Encounter: Payer: Self-pay | Admitting: Family Medicine

## 2023-03-10 DIAGNOSIS — Z23 Encounter for immunization: Secondary | ICD-10-CM | POA: Diagnosis not present

## 2023-03-31 ENCOUNTER — Other Ambulatory Visit: Payer: Self-pay | Admitting: Otolaryngology

## 2023-03-31 DIAGNOSIS — J3489 Other specified disorders of nose and nasal sinuses: Secondary | ICD-10-CM | POA: Diagnosis not present

## 2023-03-31 DIAGNOSIS — K118 Other diseases of salivary glands: Secondary | ICD-10-CM | POA: Diagnosis not present

## 2023-03-31 DIAGNOSIS — K119 Disease of salivary gland, unspecified: Secondary | ICD-10-CM

## 2023-03-31 DIAGNOSIS — J342 Deviated nasal septum: Secondary | ICD-10-CM | POA: Diagnosis not present

## 2023-03-31 DIAGNOSIS — R0981 Nasal congestion: Secondary | ICD-10-CM | POA: Diagnosis not present

## 2023-04-07 ENCOUNTER — Ambulatory Visit: Payer: Medicare Other | Admitting: Podiatry

## 2023-04-09 DIAGNOSIS — R0683 Snoring: Secondary | ICD-10-CM | POA: Diagnosis not present

## 2023-04-09 DIAGNOSIS — G3184 Mild cognitive impairment, so stated: Secondary | ICD-10-CM | POA: Diagnosis not present

## 2023-04-09 DIAGNOSIS — E1143 Type 2 diabetes mellitus with diabetic autonomic (poly)neuropathy: Secondary | ICD-10-CM | POA: Diagnosis not present

## 2023-04-12 ENCOUNTER — Ambulatory Visit (INDEPENDENT_AMBULATORY_CARE_PROVIDER_SITE_OTHER): Payer: Medicare Other | Admitting: Podiatry

## 2023-04-12 ENCOUNTER — Encounter: Payer: Self-pay | Admitting: Podiatry

## 2023-04-12 DIAGNOSIS — E1142 Type 2 diabetes mellitus with diabetic polyneuropathy: Secondary | ICD-10-CM

## 2023-04-12 DIAGNOSIS — M79676 Pain in unspecified toe(s): Secondary | ICD-10-CM | POA: Diagnosis not present

## 2023-04-12 DIAGNOSIS — B351 Tinea unguium: Secondary | ICD-10-CM

## 2023-04-12 NOTE — Progress Notes (Signed)
Presents today chief complaint of pain to her elongated toenails and her left heel.  Objective: Vital signs stable alert oriented x 3 pain on palpation MucoClear tubercle of the left heel.  Toenails are long thick yellow dystrophic with mycotic no open lesions or wounds.  Assessment: Pain secondary to onychomycosis and plan fasciitis.  Plan: Debridement of toenails 1 through 5 bilateral injected left heel today 20 mg Kenalog 5 mg Marcaine point maximal tenderness.  Tolerated procedure well without complications.  Follow-up with her in 3 months

## 2023-04-19 ENCOUNTER — Other Ambulatory Visit: Payer: Self-pay | Admitting: Family Medicine

## 2023-04-19 ENCOUNTER — Other Ambulatory Visit: Payer: Self-pay | Admitting: Cardiovascular Disease

## 2023-04-19 NOTE — Telephone Encounter (Signed)
Please contact pt for future appointment. Pt overdue for f/u. Pt needing refills. 

## 2023-04-27 ENCOUNTER — Ambulatory Visit
Admission: RE | Admit: 2023-04-27 | Discharge: 2023-04-27 | Disposition: A | Discharge status: Home / Self Care | Payer: Medicare Other | Source: Ambulatory Visit | Attending: Otolaryngology | Admitting: Otolaryngology

## 2023-04-27 DIAGNOSIS — K119 Disease of salivary gland, unspecified: Secondary | ICD-10-CM

## 2023-04-27 DIAGNOSIS — K116 Mucocele of salivary gland: Secondary | ICD-10-CM | POA: Diagnosis not present

## 2023-04-28 DIAGNOSIS — R2681 Unsteadiness on feet: Secondary | ICD-10-CM | POA: Diagnosis not present

## 2023-04-28 DIAGNOSIS — R531 Weakness: Secondary | ICD-10-CM | POA: Diagnosis not present

## 2023-04-30 NOTE — Telephone Encounter (Signed)
Please contact pt for future appointment. 

## 2023-05-04 DIAGNOSIS — R2681 Unsteadiness on feet: Secondary | ICD-10-CM | POA: Diagnosis not present

## 2023-05-04 DIAGNOSIS — R531 Weakness: Secondary | ICD-10-CM | POA: Diagnosis not present

## 2023-05-06 DIAGNOSIS — R2681 Unsteadiness on feet: Secondary | ICD-10-CM | POA: Diagnosis not present

## 2023-05-06 DIAGNOSIS — R531 Weakness: Secondary | ICD-10-CM | POA: Diagnosis not present

## 2023-05-11 DIAGNOSIS — R531 Weakness: Secondary | ICD-10-CM | POA: Diagnosis not present

## 2023-05-11 DIAGNOSIS — R2681 Unsteadiness on feet: Secondary | ICD-10-CM | POA: Diagnosis not present

## 2023-05-13 DIAGNOSIS — R531 Weakness: Secondary | ICD-10-CM | POA: Diagnosis not present

## 2023-05-13 DIAGNOSIS — R2681 Unsteadiness on feet: Secondary | ICD-10-CM | POA: Diagnosis not present

## 2023-05-16 ENCOUNTER — Other Ambulatory Visit: Payer: Self-pay | Admitting: Family Medicine

## 2023-05-17 NOTE — Telephone Encounter (Signed)
LAST APPOINTMENT DATE: 02/10/23 for chronic medical management follow-up    NEXT APPOINTMENT DATE: Visit date not found    LAST REFILL: 12/06/22  QTY: #60 w 2 refills

## 2023-05-18 DIAGNOSIS — R2681 Unsteadiness on feet: Secondary | ICD-10-CM | POA: Diagnosis not present

## 2023-05-18 DIAGNOSIS — R531 Weakness: Secondary | ICD-10-CM | POA: Diagnosis not present

## 2023-05-20 DIAGNOSIS — R531 Weakness: Secondary | ICD-10-CM | POA: Diagnosis not present

## 2023-05-20 DIAGNOSIS — R2681 Unsteadiness on feet: Secondary | ICD-10-CM | POA: Diagnosis not present

## 2023-05-24 DIAGNOSIS — G4733 Obstructive sleep apnea (adult) (pediatric): Secondary | ICD-10-CM | POA: Diagnosis not present

## 2023-05-26 DIAGNOSIS — R531 Weakness: Secondary | ICD-10-CM | POA: Diagnosis not present

## 2023-05-26 DIAGNOSIS — R2681 Unsteadiness on feet: Secondary | ICD-10-CM | POA: Diagnosis not present

## 2023-05-28 DIAGNOSIS — R2681 Unsteadiness on feet: Secondary | ICD-10-CM | POA: Diagnosis not present

## 2023-05-28 DIAGNOSIS — R531 Weakness: Secondary | ICD-10-CM | POA: Diagnosis not present

## 2023-06-01 ENCOUNTER — Encounter: Payer: Self-pay | Admitting: Family Medicine

## 2023-06-01 DIAGNOSIS — R2681 Unsteadiness on feet: Secondary | ICD-10-CM | POA: Diagnosis not present

## 2023-06-01 DIAGNOSIS — R531 Weakness: Secondary | ICD-10-CM | POA: Diagnosis not present

## 2023-06-01 MED ORDER — PANTOPRAZOLE SODIUM 40 MG PO TBEC: DELAYED_RELEASE_TABLET | ORAL | 3 refills | Status: DC

## 2023-06-03 DIAGNOSIS — R531 Weakness: Secondary | ICD-10-CM | POA: Diagnosis not present

## 2023-06-03 DIAGNOSIS — R2681 Unsteadiness on feet: Secondary | ICD-10-CM | POA: Diagnosis not present

## 2023-06-08 DIAGNOSIS — R531 Weakness: Secondary | ICD-10-CM | POA: Diagnosis not present

## 2023-06-08 DIAGNOSIS — R2681 Unsteadiness on feet: Secondary | ICD-10-CM | POA: Diagnosis not present

## 2023-06-11 DIAGNOSIS — R2681 Unsteadiness on feet: Secondary | ICD-10-CM | POA: Diagnosis not present

## 2023-06-11 DIAGNOSIS — R531 Weakness: Secondary | ICD-10-CM | POA: Diagnosis not present

## 2023-06-15 ENCOUNTER — Encounter: Payer: Self-pay | Admitting: Family Medicine

## 2023-06-15 DIAGNOSIS — R2681 Unsteadiness on feet: Secondary | ICD-10-CM | POA: Diagnosis not present

## 2023-06-15 DIAGNOSIS — R531 Weakness: Secondary | ICD-10-CM | POA: Diagnosis not present

## 2023-06-17 MED ORDER — CLONAZEPAM 0.5 MG PO TABS: 0.5000 mg | ORAL_TABLET | Freq: Two times a day (BID) | ORAL | 5 refills | Status: DC | PRN

## 2023-06-18 ENCOUNTER — Other Ambulatory Visit: Payer: Self-pay | Admitting: Family Medicine

## 2023-06-18 DIAGNOSIS — R2681 Unsteadiness on feet: Secondary | ICD-10-CM | POA: Diagnosis not present

## 2023-06-18 DIAGNOSIS — R531 Weakness: Secondary | ICD-10-CM | POA: Diagnosis not present

## 2023-06-24 DIAGNOSIS — R2681 Unsteadiness on feet: Secondary | ICD-10-CM | POA: Diagnosis not present

## 2023-06-24 DIAGNOSIS — R531 Weakness: Secondary | ICD-10-CM | POA: Diagnosis not present

## 2023-06-29 DIAGNOSIS — R531 Weakness: Secondary | ICD-10-CM | POA: Diagnosis not present

## 2023-06-29 DIAGNOSIS — R2681 Unsteadiness on feet: Secondary | ICD-10-CM | POA: Diagnosis not present

## 2023-07-01 DIAGNOSIS — R531 Weakness: Secondary | ICD-10-CM | POA: Diagnosis not present

## 2023-07-01 DIAGNOSIS — R2681 Unsteadiness on feet: Secondary | ICD-10-CM | POA: Diagnosis not present

## 2023-07-06 DIAGNOSIS — R2681 Unsteadiness on feet: Secondary | ICD-10-CM | POA: Diagnosis not present

## 2023-07-06 DIAGNOSIS — R531 Weakness: Secondary | ICD-10-CM | POA: Diagnosis not present

## 2023-07-08 DIAGNOSIS — R531 Weakness: Secondary | ICD-10-CM | POA: Diagnosis not present

## 2023-07-08 DIAGNOSIS — R2681 Unsteadiness on feet: Secondary | ICD-10-CM | POA: Diagnosis not present

## 2023-07-11 ENCOUNTER — Encounter: Payer: Self-pay | Admitting: Family Medicine

## 2023-07-11 ENCOUNTER — Encounter: Payer: Self-pay | Admitting: Cardiovascular Disease

## 2023-07-12 DIAGNOSIS — R531 Weakness: Secondary | ICD-10-CM | POA: Diagnosis not present

## 2023-07-12 DIAGNOSIS — R2681 Unsteadiness on feet: Secondary | ICD-10-CM | POA: Diagnosis not present

## 2023-07-12 MED ORDER — EZETIMIBE 10 MG PO TABS: 10.0000 mg | ORAL_TABLET | Freq: Every day | ORAL | 0 refills | Status: DC

## 2023-07-12 MED ORDER — ROSUVASTATIN CALCIUM 40 MG PO TABS: 40.0000 mg | ORAL_TABLET | Freq: Every day | ORAL | 1 refills | Status: DC

## 2023-07-13 ENCOUNTER — Encounter: Payer: Self-pay | Admitting: Podiatry

## 2023-07-13 ENCOUNTER — Ambulatory Visit: Payer: Medicare Other | Admitting: Podiatry

## 2023-07-13 DIAGNOSIS — M79676 Pain in unspecified toe(s): Secondary | ICD-10-CM | POA: Diagnosis not present

## 2023-07-13 DIAGNOSIS — E1142 Type 2 diabetes mellitus with diabetic polyneuropathy: Secondary | ICD-10-CM

## 2023-07-13 DIAGNOSIS — B351 Tinea unguium: Secondary | ICD-10-CM | POA: Diagnosis not present

## 2023-07-13 NOTE — Progress Notes (Signed)
Presents today chief complaint painful elongated toenails.  Objective: Tenderness along the yellow dystrophic clinical mycotic pulses are palpable no open lesions or wounds.  Assessment: Pain limb secondary to onychomycosis.  Plan: Debridement of onychomycotic nails 1 through 5 bilateral secondary to pain.

## 2023-07-14 DIAGNOSIS — R2681 Unsteadiness on feet: Secondary | ICD-10-CM | POA: Diagnosis not present

## 2023-07-14 DIAGNOSIS — R531 Weakness: Secondary | ICD-10-CM | POA: Diagnosis not present

## 2023-07-19 DIAGNOSIS — R531 Weakness: Secondary | ICD-10-CM | POA: Diagnosis not present

## 2023-07-19 DIAGNOSIS — R2681 Unsteadiness on feet: Secondary | ICD-10-CM | POA: Diagnosis not present

## 2023-07-28 DIAGNOSIS — Z719 Counseling, unspecified: Secondary | ICD-10-CM | POA: Diagnosis not present

## 2023-07-28 DIAGNOSIS — R42 Dizziness and giddiness: Secondary | ICD-10-CM | POA: Diagnosis not present

## 2023-07-28 DIAGNOSIS — G3184 Mild cognitive impairment, so stated: Secondary | ICD-10-CM | POA: Diagnosis not present

## 2023-08-02 IMAGING — CR DG RIBS W/ CHEST 3+V*L*
1 series · 3 of 3 positions shown · non-contrast
Comparison: 11/10/2020

CLINICAL DATA: Fell today.  Chest pain.

EXAM:
LEFT RIBS AND CHEST - 3+ VIEW

[Series 1: dg ribs unilateral w/chest left · 0.14mm/px · 3 of 3 slices shown]
[im 1/3]
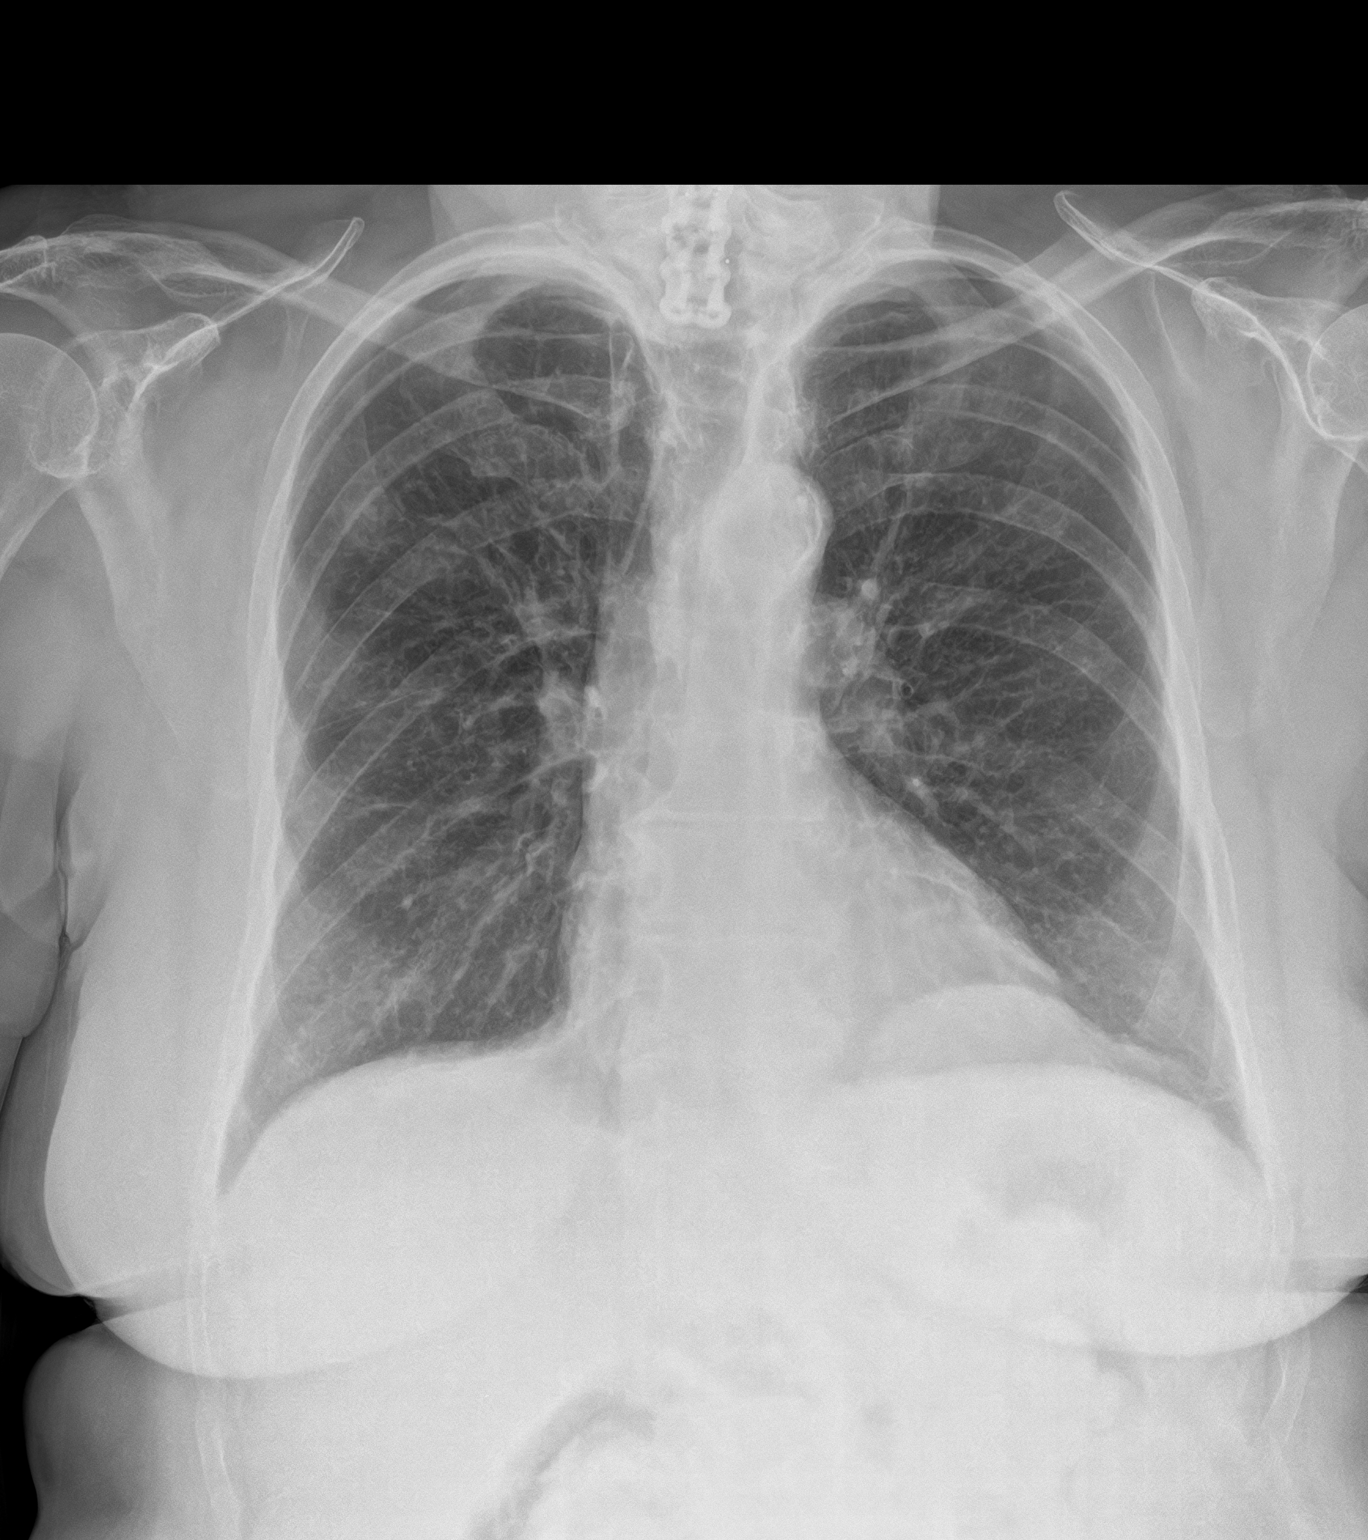
[im 2/3]
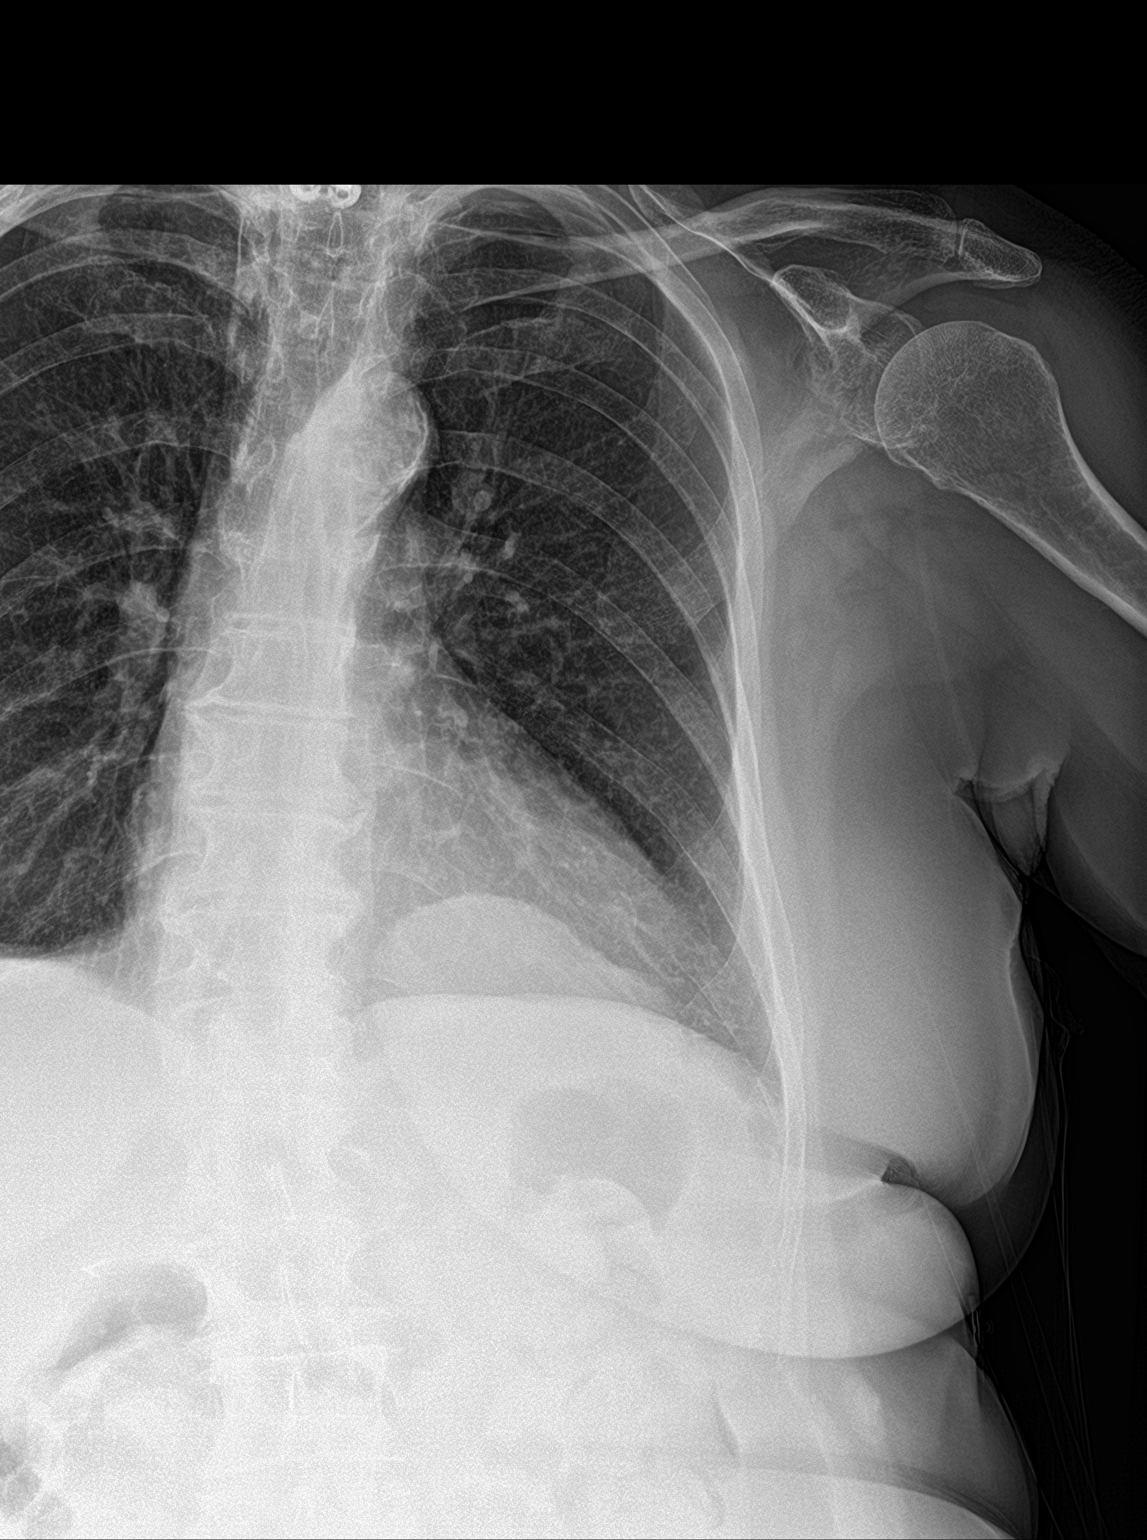
[im 3/3]
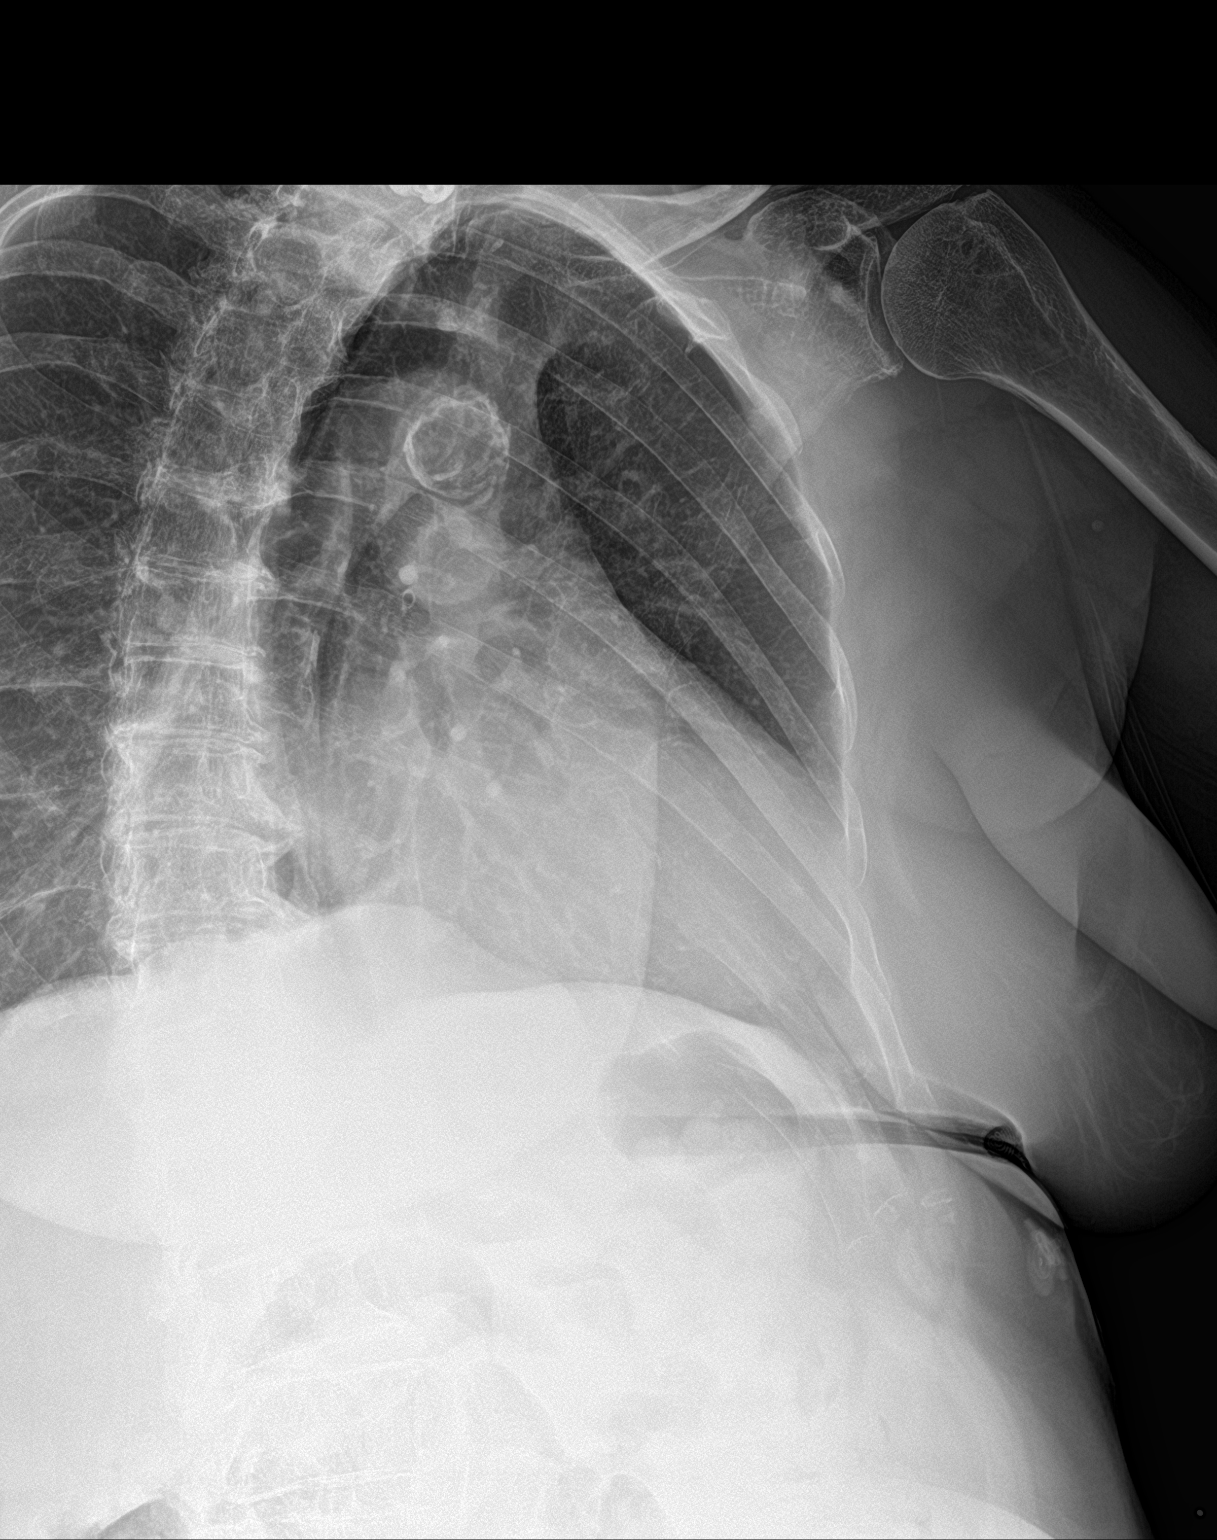

[3 of 3 positions shown; findings below may reference images not displayed]

FINDINGS: The cardiac silhouette, mediastinal and hilar contours are within
normal limits and stable. Mild tortuosity and calcification of the
thoracic aorta is again noted.

Chronic emphysematous and bronchitic type lung changes but no acute
pulmonary findings. No pneumothorax or pleural effusion. Stable
eventration of the left hemidiaphragm.

Left anterior seventh and eighth rib fractures are noted. No
significant displacement.
IMPRESSION: 1. Left anterior seventh and eighth rib fractures.
2. No pneumothorax or pleural effusion.

## 2023-08-08 ENCOUNTER — Other Ambulatory Visit: Payer: Self-pay | Admitting: Cardiovascular Disease

## 2023-08-10 NOTE — Telephone Encounter (Signed)
Can you please reach out to patient and try to schedule past due follow up.

## 2023-08-22 NOTE — Progress Notes (Unsigned)
Cardiology Office Note    Date:  08/26/2023   ID:  Amanda [name], DOB 1950/12/30, MRN [id number]  PCP:  Hannah Beat, MD  Cardiologist:  Lorine Bears, MD  Electrophysiologist:  None   Chief Complaint: Follow-up  History of Present Illness:   Amanda [name] is a 72 y.o. female with history of nonobstructive CAD, moderate aortic valve stenosis, moderate aortic insufficiency, diet-controlled diabetes, COPD, HTN, HLD, ongoing tobacco use, mild positional OSA, and mild cognitive impairment followed by neurology who presents for follow-up of her CAD and aortic stenosis/insufficiency.   Coronary CTA in 08/2018 showed a calcium score of 521 with moderate nonobstructive disease involving the LAD and RCA that were not significant by ctFFR.  Echo in 08/2019 demonstrated an EF of 60 to 65% with moderate aortic stenosis with a mean gradient of 19 mmHg and a valve area of 1.28 cm.  Repeat echo in 11/2020 showed an EF of 60 to 65%, grade 1 diastolic dysfunction, and moderate aortic stenosis with a mean gradient of 22 mmHg and a valve area of 1.29 cm.  She was admitted to the hospital in 11/2020 with chest pain and worsening exertional dyspnea.  R/LHC showed mild nonobstructive CAD with RHC showing normal filling pressures, normal pulmonary pressure, and normal cardiac output.  Aortic stenosis was moderate with a mean gradient of 24 mmHg and a valve area of 1.44 cm.  Carotid artery ultrasound in 06/2021 showed 1 to 39% bilateral ICA stenoses with antegrade flow of the bilateral vertebral arteries and normal flow hemodynamics of the bilateral subclavian arteries.  She fell in 07/2021 leading to 2 broken ribs.  She was admitted in 11/2021 with right hand cellulitis/abscess due to an infected cat bite.  Echo during that admission demonstrated an EF of 65 to 70%, no regional wall motion abnormalities, mild LVH, normal LV diastolic function parameters, normal RV systolic function and ventricular cavity size,  mild to moderate aortic valve insufficiency, and moderate aortic valve stenosis with a mean gradient of 29.2 mmHg.     She was last seen in the office in 07/2022 and reported that she forgot to mention at her last visit she had 2 episodes of chest discomfort that improved with her husband's SL NTG.  She also noted some lower extremity heaviness with ambulation and was without nonhealing wounds.  Lexiscan MPI on 07/16/2022 showed no evidence of ischemia or infarction with normal LVEF and was overall low risk.  ABI normal bilaterally in 08/2022.  Echo in 11/2022 showed an EF of 65 to 70%, no regional wall motion abnormalities, mild LVH, grade 1 diastolic dysfunction mild aortic insufficiency, moderate aortic stenosis with a mean gradient of 30 mmHg and a valve area of 1.53 cm, and an estimated right atrial pressure of 8 mmHg.  She underwent MRI of the brain in 01/2023 for evaluation of memory loss, ataxia falls, and gait disturbance.  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  She subsequently followed up with ENT and reported waxing and waning swelling in this area.  Follow-up ultrasound showed a 1.1 cm simple appearing cyst within the right parotid gland.  She comes in doing well from a cardiac perspective and is without symptoms of angina or cardiac decompensation.  No  further episodes of chest discomfort since she was last seen.  She has not needed any SL NTG.  She did note some dizziness and lightheadedness that has improved with gait stability physical therapy.  No frank syncope.  She continues to be under increased stress at home surrounding the health of her husband.  She did quit tobacco use on 01/20/2023.   Labs independently reviewed: 01/2023 -  potassium 4.4, BUN 12, serum creatinine 0.84, Hgb 12.3, PLT 163, albumin 4.2, AST/ALT normal, A1c 6.4, TSH normal, TC 138, TG 94, HDL 62, LDL 57  Past Medical History:  Diagnosis Date   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 2013: nl LV sys fxn, mild MR, no evidence of pulm htn; b. TTE 8/17: EF 60-65%, no RWMA, nl LV dia fxn,  mild AS, mod AI   CAD (coronary artery disease), native coronary artery 11/26/2020   Controlled type 2 diabetes mellitus with diabetic autonomic neuropathy, without long-term current use of insulin (HCC) 08/10/2007   Qualifier: Diagnosis of  By: Hetty Ely MD, Franne Grip    COPD (chronic obstructive pulmonary disease) (HCC) 10/30/2016   Depression    Fracture in accidental fall    GAD (generalized anxiety disorder)    GERD (gastroesophageal reflux disease)    Hiatal hernia with gastroesophageal reflux 1997   Hyperlipidemia    Hypertension    Osteoporosis    Tobacco abuse     Past Surgical History:  Procedure Laterality Date   ABDOMINAL HYSTERECTOMY  1993   ANTERIOR CERVICAL DECOMP/DISCECTOMY FUSION N/A 10/26/2018   Procedure: ACDF C4-C5 C5-C6 C6-C7;  Surgeon: Donalee Citrin, MD;  Location: [facility] OR;  Service: Neurosurgery;  Laterality: N/A;   APPENDECTOMY     BACK SURGERY     BREAST BIOPSY Right 2007   benign   CHOLECYSTECTOMY     COLONOSCOPY     30 years ago was normal per pt.    DILATION AND CURETTAGE OF UTERUS     ESOPHAGEAL DILATION     x 3   ESOPHAGOGASTRODUODENOSCOPY (EGD) WITH PROPOFOL N/A 02/06/2022   Procedure: ESOPHAGOGASTRODUODENOSCOPY (EGD) WITH PROPOFOL;  Surgeon: Wyline Mood, MD;  Location: [facility] ENDOSCOPY;  Service: Gastroenterology;  Laterality: N/A;   ESOPHAGOGASTRODUODENOSCOPY (EGD) WITH PROPOFOL N/A 02/26/2022   Procedure: ESOPHAGOGASTRODUODENOSCOPY (EGD) WITH PROPOFOL;  Surgeon: Wyline Mood, MD;  Location: [facility] ENDOSCOPY;  Service: Gastroenterology;  Laterality: N/A;  EGD+dilation per Dr. Tobi Bastos   fractured leg Right     INCISION AND DRAINAGE Right 12/02/2021   Procedure: INCISION AND DRAINAGE;  Surgeon: Kennedy Bucker, MD;  Location: [facility] ORS;  Service: Orthopedics;  Laterality: Right;   LEFT HEART CATH AND CORONARY ANGIOGRAPHY N/A 11/11/2020   Procedure: LEFT HEART CATH AND CORONARY ANGIOGRAPHY;  Surgeon: Iran Ouch, MD;  Location: [facility] INVASIVE CV LAB;  Service: Cardiovascular;  Laterality: N/A;   OOPHORECTOMY     OVARIAN CYST REMOVAL  1972   SALIVARY GLAND SURGERY     TONSILLECTOMY     4 yoa   TOTAL KNEE ARTHROPLASTY Left 12/24/2015   Procedure: LEFT TOTAL KNEE ARTHROPLASTY;  Surgeon: Cammy Copa, MD;  Location: [facility] OR;  Service: Orthopedics;  Laterality: Left;   TOTAL KNEE ARTHROPLASTY Right 12/30/2017   Procedure: RIGHT TOTAL KNEE ARTHROPLASTY;  Surgeon: Cammy Copa, MD;  Location: [facility] OR;  Service: Orthopedics;  Laterality: Right;   WRIST FRACTURE SURGERY  11/2008    Current Medications: Current Meds  Medication Sig  acetaminophen (TYLENOL) 650 MG CR tablet Take 650 mg by mouth 3 times/day as needed-between meals & bedtime (for headaches).   albuterol (VENTOLIN HFA) 108 (90 Base) MCG/ACT inhaler Inhale 2 puffs into the lungs every 4 (four) hours as needed for wheezing or shortness of breath.   amitriptyline (ELAVIL) 50 MG tablet TAKE 2 TABLETS EVERY DAY AT BEDTIME (Patient taking differently: Take 25 mg by mouth at bedtime.)   Ascorbic Acid (VITAMIN C) 1000 MG tablet Take 1,000 mg by mouth daily.   aspirin EC 81 MG tablet Take 81 mg by mouth daily.   buPROPion (WELLBUTRIN XL) 150 MG 24 hr tablet TAKE 1 TABLET EVERY DAY   calcium carbonate (TUMS - DOSED IN MG ELEMENTAL CALCIUM) 500 MG chewable tablet Chew 2 tablets by mouth daily as needed for indigestion or heartburn.   Cholecalciferol (VITAMIN D) 1000 UNITS capsule Take 1,000 Units by mouth daily.    clonazePAM (KLONOPIN) 0.5 MG tablet Take 1 tablet (0.5 mg total) by mouth 2 (two) times daily as needed.   cyclobenzaprine  (FLEXERIL) 10 MG tablet Take 1 tablet by mouth three times daily as needed for muscle spasm   dimenhyDRINATE (DRAMAMINE) 50 MG tablet Take 50 mg by mouth every 8 (eight) hours as needed.   donepezil (ARICEPT) 5 MG tablet TAKE 1 TABLET BY MOUTH AT BEDTIME   FLUoxetine (PROZAC) 40 MG capsule TAKE 2 CAPSULES EVERY DAY   fluticasone (FLONASE) 50 MCG/ACT nasal spray Place 2 sprays into both nostrils daily.   gabapentin (NEURONTIN) 100 MG capsule Take 1 capsule (100 mg total) by mouth 2 (two) times daily.   guaiFENesin (MUCINEX) 600 MG 12 hr tablet Take 600 mg by mouth daily.   metoprolol tartrate (LOPRESSOR) 25 MG tablet TAKE 1/2 TABLET TWICE DAILY   Multiple Vitamin (MULTIVITAMIN) tablet Take 1 tablet by mouth daily.   naproxen sodium (ALEVE) 220 MG tablet Take 220 mg by mouth daily.   ondansetron (ZOFRAN-ODT) 4 MG disintegrating tablet Take 1 tablet (4 mg total) by mouth every 8 (eight) hours as needed for nausea or vomiting.   pantoprazole (PROTONIX) 40 MG tablet TAKE 1 TABLET 30 MINUTES PRIOR TO BREAKFAST   rosuvastatin (CRESTOR) 40 MG tablet Take 1 tablet (40 mg total) by mouth daily.   Simethicone 125 MG CAPS Take 125 mg by mouth daily as needed (gas).   traZODone (DESYREL) 100 MG tablet Take 1 tablet (100 mg total) by mouth at bedtime.   vitamin E 180 MG (400 UNITS) capsule Take 400 Units by mouth daily.   [DISCONTINUED] ezetimibe (ZETIA) 10 MG tablet Take 1 tablet (10 mg total) by mouth daily. PATIENT NEEDS TO SCHEDULE AN APPT FOR ANY FURTHER REFILLS - 2ND REQUEST    Allergies:   Honey bee venom [bee venom] and Diazepam   Social History   Socioeconomic History   Marital status: Married    Spouse name: Not on file   Number of children: 2   Years of education: Not on file   Highest education level: Not on file  Occupational History   Occupation: [employer]    Employer: [employer]  Tobacco Use   Smoking status: Former    Average packs/day: 0.8  packs/day for 45.0 years (33.8 ttl pk-yrs)    Types: Cigarettes    Start date: 01/2023   Smokeless tobacco: Never   Tobacco comments:    Smoking half PPD but is trying to quit.   Vaping Use   Vaping status: Never Used  Substance and Sexual Activity   Alcohol use: Yes    Alcohol/week: 0.0 standard drinks of alcohol    Comment: glass of wine twice a month    Drug use: No   Sexual activity: Not Currently  Other Topics Concern   Not on file  Social History Narrative   Not on file   Social Determinants of Health   Financial Resource Strain: Low Risk  (12/18/2022)   Overall Financial Resource Strain (CARDIA)    Difficulty of Paying Living Expenses: Not hard at all  Food Insecurity: No Food Insecurity (12/18/2022)   Hunger Vital Sign    Worried About Running Out of Food in the Last Year: Never true    Ran Out of Food in the Last Year: Never true  Transportation Needs: No Transportation Needs (12/18/2022)   PRAPARE - Administrator, Civil Service (Medical): No    Lack of Transportation (Non-Medical): No  Physical Activity: Unknown (12/18/2022)   Exercise Vital Sign    Days of Exercise per Week: 0 days    Minutes of Exercise per Session: Not on file  Stress: No Stress Concern Present (12/18/2022)   [employer] of Occupational Health - Occupational Stress Questionnaire    Feeling of Stress : Not at all  Social Connections: Unknown (12/18/2022)   Social Connection and Isolation Panel [NHANES]    Frequency of Communication with Friends and Family: Not on file    Frequency of Social Gatherings with Friends and Family: More than three times a week    Attends Religious Services: Not on Marketing executive or Organizations: Not on file    Attends Banker Meetings: Not on file    Marital Status: Married     Family History:  The patient's family history includes Arthritis in her mother; Breast cancer in her sister; Cancer in her paternal grandmother;  Colon polyps in her mother and sister; Diabetes in her mother; Heart disease in her mother; Hypercholesterolemia in her sister; Hyperlipidemia in her mother. There is no history of Colon cancer, Esophageal cancer, Rectal cancer, Stomach cancer, or Pancreatic cancer.  ROS:   12-point review of systems is negative unless otherwise noted in the HPI.   EKGs/Labs/Other Studies Reviewed:    Studies reviewed were summarized above. The additional studies were reviewed today:  2D echo 11/11/2022: 1. Left ventricular ejection fraction, by estimation, is 65 to 70%. The  left ventricle has normal function. The left ventricle has no regional  wall motion abnormalities. There is mild left ventricular hypertrophy.  Left ventricular diastolic parameters  are consistent with Grade I diastolic dysfunction (impaired relaxation).   2. Right ventricular systolic function is normal. The right ventricular  size is normal.   3. The mitral valve is degenerative. No evidence of mitral valve  regurgitation.   4. Mean aortic valve gradient , peak gradient , Vmax 3.60m/s,  DVI 0.37.Marland Kitchen The aortic valve is calcified. Aortic valve regurgitation is  mild. Moderate aortic valve stenosis. Aortic valve area, by VTI measures  1.53 cm.   5. The inferior vena cava is dilated in size with >50% respiratory  variability, suggesting right atrial pressure of 8 mmHg.  ___________  2D echo 12/02/2021: 1. Left ventricular ejection fraction, by estimation, is 65 to 70%. The  left ventricle has normal function. The left ventricle has no regional  wall motion abnormalities. There is mild left ventricular hypertrophy.  Left ventricular diastolic parameters  were normal.   2.  Right ventricular systolic function is normal. The right ventricular  size is normal.   3. The mitral valve is degenerative. No evidence of mitral valve  regurgitation.   4. The aortic valve is calcified. Aortic valve regurgitation is mild to   moderate. Moderate aortic valve stenosis. Aortic valve mean gradient  measures 29.2 mmHg. Aortic valve Vmax measures 3.35 m/s.   5. The inferior vena cava is normal in size with greater than 50%  respiratory variability, suggesting right atrial pressure of 3 mmHg __________   Carotid artery ultrasound 07/04/2021: Summary:  Right Carotid: Velocities in the right ICA are consistent with a 1-39%  stenosis. Non-hemodynamically significant plaque <50% noted in the  CCA. The ECA appears <50% stenosed.   Left Carotid: Velocities in the left ICA are consistent with a 1-39%  stenosis. Non-hemodynamically significant plaque <50% noted in the CCA. The ECA appears <50% stenosed.   Vertebrals:  Bilateral vertebral arteries demonstrate antegrade flow.  Subclavians: Normal flow hemodynamics were seen in bilateral subclavian arteries. __________   LHC 11/11/2020: 1.  Mild nonobstructive coronary artery disease.  Tortuous vessels overall suggestive of chronic hypertension. 2.  Left ventricular angiography was not performed.  EF was normal by echo. 3.  Right heart catheterization showed normal filling pressures, normal pulmonary pressure and normal cardiac output. 4.  Moderate aortic stenosis with mean gradient of 24 mmHg and valve area of 1.44 cm.   Recommendations: No culprit is identified for the patient's chest pain.  Recommend medical therapy for nonobstructive coronary artery disease. Continue to monitor aortic stenosis with echocardiograms. The patient can be discharged home from a cardiac standpoint.  No other issues. __________   2D echo 12/2/201.  1. Left ventricular ejection fraction, by estimation, is 60 to 65%. The  left ventricle has normal function. The left ventricle has no regional  wall motion abnormalities. There is mild to moderate left ventricular  hypertrophy. Left ventricular diastolic  parameters are consistent with Grade I diastolic dysfunction (impaired  relaxation). The  average left ventricular global longitudinal strain is  -15.3 %. The global longitudinal strain is abnormal.   2. Right ventricular systolic function is normal. The right ventricular  size is normal.   3. Left atrial size was mildly dilated.   4. The aortic valve is moderately calcified. Aortic valve regurgitation  is mild. Moderate aortic valve stenosis. Aortic valve area, by VTI  measures 1.29 cm. Aortic valve mean gradient measures 22.2 mmHg. Aortic  valve Vmax measures 3.34 m/s. __________   2D echo 08/23/2019: 1. The left ventricle has normal systolic function with an ejection  fraction of 60-65%. The cavity size was normal. There is mild concentric  left ventricular hypertrophy. Left ventricular diastolic Doppler  parameters are consistent with impaired  relaxation.   2. The right ventricle has normal systolic function. The cavity was  normal. There is No increase in right ventricular wall thickness.   3. Left atrial size was normal.   4. Right atrial size was normal.   5. The mitral valve is abnormal. mild calcification of the mitral valve  leaflet.   6. The aortic valve is abnormal. Moderate sclerosis of the aortic valve.  Aortic valve regurgitation is mild by color flow Doppler. Mild-moderate  stenosis of the aortic valve. DVI 0.36   7. The aorta is normal unless otherwise noted.   8. The pulmonic valve was not well visualized. Pulmonic valve  regurgitation was not assessed by color flow Doppler. __________   Coronary CTA 10/05/2018:  FINDINGS: Non-cardiac: See separate report from [facility]. No significant findings on limited lung and soft tissue windows.   Calcium score: Dense calcium noted in LAD and RCA   Coronary Arteries: Right dominant with no anomalies   LM: Distal LM less than 50% calcified stenosis   LAD: Ostial and mid LAD less than 50% calcific stenosis   D1: Small vessel less than 30% mixed plaque   D2: Large branching vessel 50% calcific  plaque proximally   Circumflex: Less than 50% calcified plaque at ostium   OM1: Small branch without significant disease   RCA: 50% or less calcific plaque in proximal mid and distal vessel   PDA: Less than 30% calcific plaque   PLA: Less than 30% calcific plaque   IMPRESSION: 1. Calcific aortic atherosclerosis moderate Normal aortic root 2.9 cm 2.  Calcified Aortic Valve suggest TTE correlation for AS 3.  Calcium score 521 which is 94 th percentile for age and sex 27. Moderate non obstructive appearing disease primarily in RCA and LAD as well as D2 Study will be sent for FFR CT __________   2D echo 08/17/2018: - Left ventricle: The cavity size was at the upper limits of    normal. Wall thickness was increased in a pattern of mild LVH.    Systolic function was normal. The estimated ejection fraction was    in the range of 60% to 65%. Wall motion was normal; there were no    regional wall motion abnormalities. Doppler parameters are    consistent with abnormal left ventricular relaxation (grade 1    diastolic dysfunction).  - Aortic valve: Transvalvular velocity was increased. There was    mild to moderate stenosis. There was mild to moderate    regurgitation. Mean gradient (S): 19 mm Hg. Valve area (VTI):    1.28 cm^2.  - Mitral valve: Calcified annulus.  - Left atrium: The atrium was mildly dilated.  - Right ventricle: The cavity size was normal. Systolic function    was normal. ___________   2D echo 08/05/2016: - Left ventricle: The cavity size was normal. There was mild focal    basal hypertrophy of the septum. Systolic function was normal.    The estimated ejection fraction was in the range of 60% to 65%.    Wall motion was normal; there were no regional wall motion    abnormalities. Left ventricular diastolic function parameters    were normal for the patient&'s age.  - Aortic valve: Calcified annulus. Probably trileaflet. There was    mild stenosis, based on peak  velocity and mean gradient. There    was moderate regurgitation. __________   2D echo 03/17/2012: - Left ventricle: The cavity size was normal. There was mild    to moderate concentric hypertrophy. Systolic function was    normal. The estimated ejection fraction was in the range    of 55% to 60%. Wall motion was normal; there were no    regional wall motion abnormalities. Doppler parameters are    consistent with abnormal left ventricular relaxation    (grade 1 diastolic dysfunction).  - Aortic valve: Mild regurgitation.  - Mitral valve: Mild regurgitation.  - Right ventricle: Systolic function was normal.  - Pulmonary arteries: Systolic pressure was within the    normal range.   EKG:  EKG is ordered today.  The EKG ordered today demonstrates NSR, 73 bpm, RBBB, consistent with prior tracing  Recent Labs: 01/28/2023: ALT 20; BUN 12; Creatinine, Ser 0.84; Hemoglobin 12.3;  Platelets 163.0; Potassium 4.4; Sodium 138; TSH 1.33  Recent Lipid Panel    Component Value Date/Time   CHOL 138 01/28/2023 1158   CHOL 201 (H) 09/18/2013 1016   TRIG 94.0 01/28/2023 1158   HDL 62.40 01/28/2023 1158   HDL 53 09/18/2013 1016   CHOLHDL 2 01/28/2023 1158   VLDL 18.8 01/28/2023 1158   LDLCALC 57 01/28/2023 1158   LDLCALC 130 (H) 09/18/2013 1016   LDLDIRECT 209.2 05/28/2009 0909    PHYSICAL EXAM:    VS:  BP 106/60 (BP Location: Left Arm, Patient Position: Sitting, Cuff Size: Large)   Pulse 73   Ht 5\' 7"  (1.702 m)   Wt 231 lb 2 oz (104.8 kg)   SpO2 97%   BMI 36.20 kg/m   BMI: Body mass index is 36.2 kg/m.  Physical Exam Vitals reviewed.  Constitutional:      Appearance: She is well-developed.  HENT:     Head: Normocephalic and atraumatic.  Eyes:     General:        Right eye: No discharge.        Left eye: No discharge.  Neck:     Vascular: No JVD.  Cardiovascular:     Rate and Rhythm: Normal rate and regular rhythm.     Pulses:          Posterior tibial pulses are 2+ on the  right side and 2+ on the left side.     Heart sounds: S1 normal and S2 normal. Heart sounds not distant. No midsystolic click and no opening snap. Murmur heard.     Harsh midsystolic murmur is present with a grade of 2/6 at the upper right sternal border radiating to the neck.     No friction rub.  Pulmonary:     Effort: Pulmonary effort is normal. No respiratory distress.     Breath sounds: Normal breath sounds. No decreased breath sounds, wheezing or rales.  Chest:     Chest wall: No tenderness.  Abdominal:     General: There is no distension.  Musculoskeletal:     Cervical back: Normal range of motion.     Right lower leg: No edema.     Left lower leg: No edema.  Skin:    General: Skin is warm and dry.     Nails: There is no clubbing.  Neurological:     Mental Status: She is alert and oriented to person, place, and time.  Psychiatric:        Speech: Speech normal.        Behavior: Behavior normal.        Thought Content: Thought content normal.        Judgment: Judgment normal.     Wt Readings from Last 3 Encounters:  08/26/23 231 lb 2 oz (104.8 kg)  02/10/23 225 lb 8 oz (102.3 kg)  01/28/23 224 lb 8 oz (101.8 kg)     ASSESSMENT & PLAN:   Nonobstructive CAD: No symptoms of angina or cardiac decompensation.  Lexiscan MPI in 08/2022 showed no evidence of ischemia.  Continue aggressive risk factor modification and primary prevention including aspirin, ezetimibe, metoprolol tartrate, and rosuvastatin.  No indication for further ischemic testing at this time.  Aortic valve stenosis/insufficiency: Stable aortic stenosis by echo in 11/2022 with a mean gradient of 30 mmHg.  Aortic insufficiency stable to improved by most recent echo.  Follow-up echo in 11/2023.  HLD: LDL 57 in 01/2023.  She remains on rosuvastatin 40 mg  and ezetimibe.  Tobacco use: Quit on 01/20/2023.  Congratulations were offered.  Bilateral carotid artery bruit: Carotid artery ultrasound in 06/2021 showed less  than 40% bilateral ICA stenosis.  Remains on aspirin and rosuvastatin as outlined above.     Disposition: F/u with Dr. Kirke Corin or an APP in 6 months.   Medication Adjustments/Labs and Tests Ordered: Current medicines are reviewed at length with the patient today.  Concerns regarding medicines are outlined above. Medication changes, Labs and Tests ordered today are summarized above and listed in the Patient Instructions accessible in Encounters.   Signed, Eula Listen, PA-C 08/26/2023 3:29 PM     [facility] [place] [address] [place], [place] [address] [phone]

## 2023-08-26 ENCOUNTER — Ambulatory Visit: Payer: Medicare Other | Attending: Physician Assistant | Admitting: Physician Assistant

## 2023-08-26 ENCOUNTER — Encounter: Payer: Self-pay | Admitting: Physician Assistant

## 2023-08-26 VITALS — BP 106/60 | HR 73 | Ht 67.0 in | Wt 231.1 lb

## 2023-08-26 DIAGNOSIS — I2510 Atherosclerotic heart disease of native coronary artery without angina pectoris: Secondary | ICD-10-CM | POA: Diagnosis not present

## 2023-08-26 DIAGNOSIS — I350 Nonrheumatic aortic (valve) stenosis: Secondary | ICD-10-CM | POA: Insufficient documentation

## 2023-08-26 DIAGNOSIS — R0989 Other specified symptoms and signs involving the circulatory and respiratory systems: Secondary | ICD-10-CM | POA: Diagnosis present

## 2023-08-26 DIAGNOSIS — Z87891 Personal history of nicotine dependence: Secondary | ICD-10-CM | POA: Insufficient documentation

## 2023-08-26 DIAGNOSIS — E785 Hyperlipidemia, unspecified: Secondary | ICD-10-CM | POA: Insufficient documentation

## 2023-08-26 MED ORDER — EZETIMIBE 10 MG PO TABS: 10.0000 mg | ORAL_TABLET | Freq: Every day | ORAL | 1 refills | Status: DC

## 2023-08-26 NOTE — Patient Instructions (Addendum)
Medication Instructions:  No changes *If you need a refill on your cardiac medications before your next appointment, please call your pharmacy*   Lab Work: None ordered If you have labs (blood work) drawn today and your tests are completely normal, you will receive your results only by: MyChart Message (if you have MyChart) OR A paper copy in the mail If you have any lab test that is abnormal or we need to change your treatment, we will call you to review the results.   Testing/Procedures: Your physician has requested that you have an echocardiogram. Echocardiography is a painless test that uses sound waves to create images of your heart. It provides your doctor with information about the size and shape of your heart and how well your heart's chambers and valves are working.   You may receive an ultrasound enhancing agent through an IV if needed to better visualize your heart during the echo. This procedure takes approximately one hour.  There are no restrictions for this procedure.  This will take place at [address] ([facility] [address], [place] [address]    Follow-Up: At [facility], you and your health needs are our priority.  As part of our continuing mission to provide you with exceptional heart care, we have created designated Provider Care Teams.  These Care Teams include your primary Cardiologist (physician) and Advanced Practice Providers (APPs -  Physician Assistants and Nurse Practitioners) who all work together to provide you with the care you need, when you need it.  We recommend signing up for the patient portal called "MyChart".  Sign up information is provided on this After Visit Summary.  MyChart is used to connect with patients for Virtual Visits (Telemedicine).  Patients are able to view lab/test results, encounter notes, upcoming appointments, etc.  Non-urgent messages can be sent to your provider as well.   To learn more about what you can  do with MyChart, go to [email or web address].    Your next appointment:   6 month(s)  Provider:   You may see Lorine Bears, MD or one of the following Advanced Practice Providers on your designated Care Team:   Eula Listen, [place]

## 2023-08-27 ENCOUNTER — Other Ambulatory Visit: Payer: Self-pay | Admitting: Family Medicine

## 2023-09-20 ENCOUNTER — Encounter: Payer: Self-pay | Admitting: Family Medicine

## 2023-09-20 ENCOUNTER — Ambulatory Visit: Payer: Medicare Other | Attending: Physician Assistant

## 2023-09-20 DIAGNOSIS — I350 Nonrheumatic aortic (valve) stenosis: Secondary | ICD-10-CM | POA: Insufficient documentation

## 2023-09-20 DIAGNOSIS — R519 Headache, unspecified: Secondary | ICD-10-CM

## 2023-09-20 LAB — ECHOCARDIOGRAM COMPLETE
AR max vel: 0.73 cm2
AV Area VTI: 0.66 cm2
AV Area mean vel: 0.69 cm2
AV Mean grad: 33 mm[Hg]
AV Peak grad: 55.5 mm[Hg]
Ao pk vel: 3.73 m/s
Area-P 1/2: 3.08 cm2

## 2023-09-21 MED ORDER — AMITRIPTYLINE HCL 50 MG PO TABS: 25.0000 mg | ORAL_TABLET | Freq: Every day | ORAL | 1 refills | Status: DC

## 2023-09-21 NOTE — H&P (View-Only) (Signed)
Cardiology Office Note    Date:  09/22/2023   ID:  Amanda [name], DOB 26-Apr-1951, MRN [id number]  PCP:  Hannah Beat, MD  Cardiologist:  Lorine Bears, MD  Electrophysiologist:  None   Chief Complaint: Follow-up  History of Present Illness:   Amanda [name] is a 72 y.o. female with history of nonobstructive CAD, severe aortic valve stenosis, mild to moderate aortic insufficiency, diet-controlled diabetes, COPD, HTN, HLD, ongoing tobacco use, mild positional OSA, and mild cognitive impairment followed by neurology who presents for follow-up of echo.   Coronary CTA in 08/2018 showed a calcium score of 521 with moderate nonobstructive disease involving the LAD and RCA that were not significant by ctFFR.  Echo in 08/2019 demonstrated an EF of 60 to 65% with moderate aortic stenosis with a mean gradient of 19 mmHg and a valve area of 1.28 cm.  Repeat echo in 11/2020 showed an EF of 60 to 65%, grade 1 diastolic dysfunction, and moderate aortic stenosis with a mean gradient of 22 mmHg and a valve area of 1.29 cm.  She was admitted to the hospital in 11/2020 with chest pain and worsening exertional dyspnea.  R/LHC showed mild nonobstructive CAD with RHC showing normal filling pressures, normal pulmonary pressure, and normal cardiac output.  Aortic stenosis was moderate with a mean gradient of 24 mmHg and a valve area of 1.44 cm.  Carotid artery ultrasound in 06/2021 showed 1 to 39% bilateral ICA stenoses with antegrade flow of the bilateral vertebral arteries and normal flow hemodynamics of the bilateral subclavian arteries.  She fell in 07/2021 leading to 2 broken ribs.  She was admitted in 11/2021 with right hand cellulitis/abscess due to an infected cat bite.  Echo during that admission demonstrated an EF of 65 to 70%, no regional wall motion abnormalities, mild LVH, normal LV diastolic function parameters, normal RV systolic function and ventricular cavity size, mild to moderate aortic valve  insufficiency, and moderate aortic valve stenosis with a mean gradient of 29.2 mmHg.  She was seen in 07/2022 and forgot to mention at her prior visit that she had 2 episodes of chest discomfort that improved with husband's SL NTG.  She also noted some lower extremity heaviness with ambulation.  Lexiscan MPI on 07/16/2022 showed no evidence of ischemia or infarction with normal LVEF and was overall low risk.  ABI normal bilaterally in 08/2022.  Echo in 11/2022 showed an EF of 65 to 70%, no regional wall motion abnormalities, mild LVH, grade 1 diastolic dysfunction mild aortic insufficiency, moderate aortic stenosis with a mean gradient of 30 mmHg and a valve area of 1.53 cm, and an estimated right atrial pressure of 8 mmHg.   She underwent MRI of the brain in 01/2023 for evaluation of memory loss, ataxia falls, and gait disturbance.  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  She subsequently followed up with ENT and reported waxing and waning swelling in this area.  Follow-up ultrasound showed a 1.1 cm simple appearing cyst within the right parotid gland.  She was last seen in the office in 08/2023 and was without symptoms of angina or cardiac decompensation.  Prior dizziness/lightheadedness improved with gait stability physical therapy.  To follow-up on her aortic valve stenosis/insufficiency she  underwent echo on 09/20/2023, that showed an EF of 60 to 65%, no regional wall motion abnormalities, mild LVH, grade 1 diastolic dysfunction, normal RV systolic function and ventricular cavity size, mild mitral regurgitation, severe aortic stenosis with a mean gradient of 36 mmHg and a valve area of 0.7 cm, mild aortic insufficiency, and an estimated right atrial  pressure of 8 mmHg.  When compared to echo from 11/2022, her aortic stenosis has progressed with a prior mean gradient of 30 mmHg and a valve area of 1.53 cm.  Given progressive aortic valve stenosis, appointment was scheduled for today.  She comes in today and is without symptoms of angina or cardiac decompensation.  She does notice some increased shortness of breath with exertion that improves with rest.  No palpitations.  No lower extremity swelling or progressive orthopnea.  No PND or abdominal distention.  No dizziness, presyncope, or syncope.  Continues to abstain from tobacco use.  Reports her mother had to undergo AVR.   Labs independently reviewed: 01/2023 - potassium 4.4, BUN 12, serum creatinine 0.84, Hgb 12.3, PLT 163, albumin 4.2, AST/ALT normal, A1c 6.4, TSH normal, TC 138, TG 94, HDL 62, LDL 57   Past Medical History:  Diagnosis Date   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 2013: nl LV sys fxn, mild MR, no evidence of pulm htn; b. TTE 8/17: EF 60-65%, no RWMA, nl LV dia fxn,  mild AS, mod AI   CAD (coronary artery disease), native coronary artery 11/26/2020   Controlled type 2 diabetes mellitus with diabetic autonomic neuropathy, without long-term current use of insulin (HCC) 08/10/2007   Qualifier: Diagnosis of  By: Hetty Ely MD, Franne Grip    COPD (chronic obstructive pulmonary disease) (HCC) 10/30/2016   Depression    Fracture in accidental fall    GAD (generalized anxiety disorder)    GERD (gastroesophageal reflux disease)    Hiatal hernia with gastroesophageal reflux 1997   Hyperlipidemia    Hypertension    Osteoporosis    Tobacco abuse     Past Surgical History:  Procedure Laterality Date   ABDOMINAL HYSTERECTOMY  1993   ANTERIOR CERVICAL DECOMP/DISCECTOMY FUSION N/A 10/26/2018   Procedure: ACDF C4-C5 C5-C6 C6-C7;  Surgeon: Donalee Citrin, MD;  Location: [facility] OR;  Service: Neurosurgery;  Laterality: N/A;   APPENDECTOMY     BACK SURGERY     BREAST  BIOPSY Right 2007   benign   CHOLECYSTECTOMY     COLONOSCOPY     30 years ago was normal per pt.    DILATION AND CURETTAGE OF UTERUS     ESOPHAGEAL DILATION     x 3   ESOPHAGOGASTRODUODENOSCOPY (EGD) WITH PROPOFOL N/A 02/06/2022   Procedure: ESOPHAGOGASTRODUODENOSCOPY (EGD) WITH PROPOFOL;  Surgeon: Wyline Mood, MD;  Location: [facility] ENDOSCOPY;  Service: Gastroenterology;  Laterality: N/A;   ESOPHAGOGASTRODUODENOSCOPY (EGD) WITH PROPOFOL N/A 02/26/2022   Procedure: ESOPHAGOGASTRODUODENOSCOPY (EGD) WITH PROPOFOL;  Surgeon: Wyline Mood, MD;  Location: [facility] ENDOSCOPY;  Service: Gastroenterology;  Laterality: N/A;  EGD+dilation per Dr. Tobi Bastos   fractured leg Right    INCISION AND DRAINAGE Right 12/02/2021   Procedure: INCISION AND DRAINAGE;  Surgeon: Kennedy Bucker, MD;  Location: [facility] ORS;  Service: Orthopedics;  Laterality: Right;   LEFT HEART CATH AND CORONARY ANGIOGRAPHY N/A 11/11/2020   Procedure: LEFT HEART CATH AND CORONARY ANGIOGRAPHY;  Surgeon: Iran Ouch, MD;  Location: [facility] INVASIVE CV LAB;  Service: Cardiovascular;  Laterality: N/A;  OOPHORECTOMY     OVARIAN CYST REMOVAL  1972   SALIVARY GLAND SURGERY     TONSILLECTOMY     4 yoa   TOTAL KNEE ARTHROPLASTY Left 12/24/2015   Procedure: LEFT TOTAL KNEE ARTHROPLASTY;  Surgeon: Cammy Copa, MD;  Location: [facility] OR;  Service: Orthopedics;  Laterality: Left;   TOTAL KNEE ARTHROPLASTY Right 12/30/2017   Procedure: RIGHT TOTAL KNEE ARTHROPLASTY;  Surgeon: Cammy Copa, MD;  Location: [facility] OR;  Service: Orthopedics;  Laterality: Right;   WRIST FRACTURE SURGERY  11/2008    Current Medications: Current Meds  Medication Sig   acetaminophen (TYLENOL) 650 MG CR tablet Take 650 mg by mouth 3 times/day as needed-between meals & bedtime (for headaches).   albuterol (VENTOLIN HFA) 108 (90 Base) MCG/ACT inhaler Inhale 2 puffs into the lungs every 4 (four) hours as needed for wheezing or shortness of breath.   amitriptyline (ELAVIL) 50 MG  tablet Take 0.5 tablets (25 mg total) by mouth at bedtime.   Ascorbic Acid (VITAMIN C) 1000 MG tablet Take 1,000 mg by mouth daily.   aspirin EC 81 MG tablet Take 81 mg by mouth daily.   buPROPion (WELLBUTRIN XL) 150 MG 24 hr tablet TAKE 1 TABLET EVERY DAY   calcium carbonate (TUMS - DOSED IN MG ELEMENTAL CALCIUM) 500 MG chewable tablet Chew 2 tablets by mouth daily as needed for indigestion or heartburn.   Cholecalciferol (VITAMIN D) 1000 UNITS capsule Take 1,000 Units by mouth daily.    clonazePAM (KLONOPIN) 0.5 MG tablet Take 1 tablet (0.5 mg total) by mouth 2 (two) times daily as needed.   cyclobenzaprine (FLEXERIL) 10 MG tablet Take 1 tablet by mouth three times daily as needed for muscle spasm   dimenhyDRINATE (DRAMAMINE) 50 MG tablet Take 50 mg by mouth every 8 (eight) hours as needed.   donepezil (ARICEPT) 5 MG tablet TAKE 1 TABLET BY MOUTH AT BEDTIME   ezetimibe (ZETIA) 10 MG tablet Take 1 tablet (10 mg total) by mouth daily.   FLUoxetine (PROZAC) 40 MG capsule TAKE 2 CAPSULES EVERY DAY   fluticasone (FLONASE) 50 MCG/ACT nasal spray Place 2 sprays into both nostrils daily.   gabapentin (NEURONTIN) 100 MG capsule Take 1 capsule (100 mg total) by mouth 2 (two) times daily.   guaiFENesin (MUCINEX) 600 MG 12 hr tablet Take 600 mg by mouth daily.   metoprolol tartrate (LOPRESSOR) 25 MG tablet TAKE 1/2 TABLET TWICE DAILY   Multiple Vitamin (MULTIVITAMIN) tablet Take 1 tablet by mouth daily.   naproxen sodium (ALEVE) 220 MG tablet Take 220 mg by mouth daily.   ondansetron (ZOFRAN-ODT) 4 MG disintegrating tablet Take 1 tablet (4 mg total) by mouth every 8 (eight) hours as needed for nausea or vomiting.   pantoprazole (PROTONIX) 40 MG tablet TAKE 1 TABLET 30 MINUTES PRIOR TO BREAKFAST   rosuvastatin (CRESTOR) 40 MG tablet Take 1 tablet (40 mg total) by mouth daily.   Simethicone 125 MG CAPS Take 125 mg by mouth daily as needed (gas).   vitamin E 180 MG (400 UNITS) capsule Take 400 Units by  mouth daily.    Allergies:   Honey bee venom [bee venom] and Diazepam   Social History   Socioeconomic History   Marital status: Married    Spouse name: Not on file   Number of children: 2   Years of education: Not on file   Highest education level: Not on file  Occupational History   Occupation: Landscape architect  Employer: [employer]  Tobacco Use   Smoking status: Former    Average packs/day: 0.8 packs/day for 45.0 years (33.8 ttl pk-yrs)    Types: Cigarettes    Start date: 01/2023   Smokeless tobacco: Never  Vaping Use   Vaping status: Never Used  Substance and Sexual Activity   Alcohol use: Yes    Alcohol/week: 0.0 standard drinks of alcohol    Comment: glass of wine twice a month    Drug use: No   Sexual activity: Not Currently  Other Topics Concern   Not on file  Social History Narrative   Not on file   Social Determinants of Health   Financial Resource Strain: Low Risk  (12/18/2022)   Overall Financial Resource Strain (CARDIA)    Difficulty of Paying Living Expenses: Not hard at all  Food Insecurity: No Food Insecurity (12/18/2022)   Hunger Vital Sign    Worried About Running Out of Food in the Last Year: Never true    Ran Out of Food in the Last Year: Never true  Transportation Needs: No Transportation Needs (12/18/2022)   PRAPARE - Administrator, Civil Service (Medical): No    Lack of Transportation (Non-Medical): No  Physical Activity: Unknown (12/18/2022)   Exercise Vital Sign    Days of Exercise per Week: 0 days    Minutes of Exercise per Session: Not on file  Stress: No Stress Concern Present (12/18/2022)   [employer] of Occupational Health - Occupational Stress Questionnaire    Feeling of Stress : Not at all  Social Connections: Unknown (12/18/2022)   Social Connection and Isolation Panel [NHANES]    Frequency of Communication with Friends and Family: Not on file    Frequency of Social Gatherings with  Friends and Family: More than three times a week    Attends Religious Services: Not on Marketing executive or Organizations: Not on file    Attends Banker Meetings: Not on file    Marital Status: Married     Family History:  The patient's family history includes Arthritis in her mother; Breast cancer in her sister; Cancer in her paternal grandmother; Colon polyps in her mother and sister; Diabetes in her mother; Heart disease in her mother; Hypercholesterolemia in her sister; Hyperlipidemia in her mother. There is no history of Colon cancer, Esophageal cancer, Rectal cancer, Stomach cancer, or Pancreatic cancer.  ROS:   12-point review of systems is negative unless otherwise noted in the HPI.   EKGs/Labs/Other Studies Reviewed:    Studies reviewed were summarized above. The additional studies were reviewed today:  2D echo 09/20/2023: 1. Left ventricular ejection fraction, by estimation, is 60 to 65%. The  left ventricle has normal function. The left ventricle has no regional  wall motion abnormalities. There is mild left ventricular hypertrophy.  Left ventricular diastolic parameters  are consistent with Grade I diastolic dysfunction (impaired relaxation).   2. Right ventricular systolic function is normal. The right ventricular  size is normal.   3. The mitral valve is degenerative. Mild mitral valve regurgitation.   4. AV mean gradient , PG , DVI 0.24, AVA 0.7cm2. The aortic  valve is calcified. Aortic valve regurgitation is mild. Severe aortic  valve stenosis.   5. The inferior vena cava is dilated in size with >50% respiratory  variability, suggesting right atrial pressure of 8 mmHg.  __________  2D echo 11/11/2022: 1. Left ventricular ejection fraction, by estimation, is  65 to 70%. The  left ventricle has normal function. The left ventricle has no regional  wall motion abnormalities. There is mild left ventricular hypertrophy.  Left  ventricular diastolic parameters  are consistent with Grade I diastolic dysfunction (impaired relaxation).   2. Right ventricular systolic function is normal. The right ventricular  size is normal.   3. The mitral valve is degenerative. No evidence of mitral valve  regurgitation.   4. Mean aortic valve gradient , peak gradient , Vmax 3.73m/s,  DVI 0.37.Marland Kitchen The aortic valve is calcified. Aortic valve regurgitation is  mild. Moderate aortic valve stenosis. Aortic valve area, by VTI measures  1.53 cm.   5. The inferior vena cava is dilated in size with >50% respiratory  variability, suggesting right atrial pressure of 8 mmHg.  ___________   2D echo 12/02/2021: 1. Left ventricular ejection fraction, by estimation, is 65 to 70%. The  left ventricle has normal function. The left ventricle has no regional  wall motion abnormalities. There is mild left ventricular hypertrophy.  Left ventricular diastolic parameters  were normal.   2. Right ventricular systolic function is normal. The right ventricular  size is normal.   3. The mitral valve is degenerative. No evidence of mitral valve  regurgitation.   4. The aortic valve is calcified. Aortic valve regurgitation is mild to  moderate. Moderate aortic valve stenosis. Aortic valve mean gradient  measures 29.2 mmHg. Aortic valve Vmax measures 3.35 m/s.   5. The inferior vena cava is normal in size with greater than 50%  respiratory variability, suggesting right atrial pressure of 3 mmHg __________   Carotid artery ultrasound 07/04/2021: Summary:  Right Carotid: Velocities in the right ICA are consistent with a 1-39%  stenosis. Non-hemodynamically significant plaque <50% noted in the  CCA. The ECA appears <50% stenosed.   Left Carotid: Velocities in the left ICA are consistent with a 1-39%  stenosis. Non-hemodynamically significant plaque <50% noted in the CCA. The ECA appears <50% stenosed.   Vertebrals:  Bilateral vertebral  arteries demonstrate antegrade flow.  Subclavians: Normal flow hemodynamics were seen in bilateral subclavian arteries. __________   LHC 11/11/2020: 1.  Mild nonobstructive coronary artery disease.  Tortuous vessels overall suggestive of chronic hypertension. 2.  Left ventricular angiography was not performed.  EF was normal by echo. 3.  Right heart catheterization showed normal filling pressures, normal pulmonary pressure and normal cardiac output. 4.  Moderate aortic stenosis with mean gradient of 24 mmHg and valve area of 1.44 cm.   Recommendations: No culprit is identified for the patient's chest pain.  Recommend medical therapy for nonobstructive coronary artery disease. Continue to monitor aortic stenosis with echocardiograms. The patient can be discharged home from a cardiac standpoint.  No other issues. __________   2D echo 12/2/201.  1. Left ventricular ejection fraction, by estimation, is 60 to 65%. The  left ventricle has normal function. The left ventricle has no regional  wall motion abnormalities. There is mild to moderate left ventricular  hypertrophy. Left ventricular diastolic  parameters are consistent with Grade I diastolic dysfunction (impaired  relaxation). The average left ventricular global longitudinal strain is  -15.3 %. The global longitudinal strain is abnormal.   2. Right ventricular systolic function is normal. The right ventricular  size is normal.   3. Left atrial size was mildly dilated.   4. The aortic valve is moderately calcified. Aortic valve regurgitation  is mild. Moderate aortic valve stenosis. Aortic valve area, by VTI  measures 1.29  cm. Aortic valve mean gradient measures 22.2 mmHg. Aortic  valve Vmax measures 3.34 m/s. __________   2D echo 08/23/2019: 1. The left ventricle has normal systolic function with an ejection  fraction of 60-65%. The cavity size was normal. There is mild concentric  left ventricular hypertrophy. Left ventricular  diastolic Doppler  parameters are consistent with impaired  relaxation.   2. The right ventricle has normal systolic function. The cavity was  normal. There is No increase in right ventricular wall thickness.   3. Left atrial size was normal.   4. Right atrial size was normal.   5. The mitral valve is abnormal. mild calcification of the mitral valve  leaflet.   6. The aortic valve is abnormal. Moderate sclerosis of the aortic valve.  Aortic valve regurgitation is mild by color flow Doppler. Mild-moderate  stenosis of the aortic valve. DVI 0.36   7. The aorta is normal unless otherwise noted.   8. The pulmonic valve was not well visualized. Pulmonic valve  regurgitation was not assessed by color flow Doppler. __________   Coronary CTA 10/05/2018: FINDINGS: Non-cardiac: See separate report from [facility]. No significant findings on limited lung and soft tissue windows.   Calcium score: Dense calcium noted in LAD and RCA   Coronary Arteries: Right dominant with no anomalies   LM: Distal LM less than 50% calcified stenosis   LAD: Ostial and mid LAD less than 50% calcific stenosis   D1: Small vessel less than 30% mixed plaque   D2: Large branching vessel 50% calcific plaque proximally   Circumflex: Less than 50% calcified plaque at ostium   OM1: Small branch without significant disease   RCA: 50% or less calcific plaque in proximal mid and distal vessel   PDA: Less than 30% calcific plaque   PLA: Less than 30% calcific plaque   IMPRESSION: 1. Calcific aortic atherosclerosis moderate Normal aortic root 2.9 cm 2.  Calcified Aortic Valve suggest TTE correlation for AS 3.  Calcium score 521 which is 94 th percentile for age and sex 85. Moderate non obstructive appearing disease primarily in RCA and LAD as well as D2 Study will be sent for FFR CT __________   2D echo 08/17/2018: - Left ventricle: The cavity size was at the upper limits of    normal. Wall  thickness was increased in a pattern of mild LVH.    Systolic function was normal. The estimated ejection fraction was    in the range of 60% to 65%. Wall motion was normal; there were no    regional wall motion abnormalities. Doppler parameters are    consistent with abnormal left ventricular relaxation (grade 1    diastolic dysfunction).  - Aortic valve: Transvalvular velocity was increased. There was    mild to moderate stenosis. There was mild to moderate    regurgitation. Mean gradient (S): 19 mm Hg. Valve area (VTI):    1.28 cm^2.  - Mitral valve: Calcified annulus.  - Left atrium: The atrium was mildly dilated.  - Right ventricle: The cavity size was normal. Systolic function    was normal. ___________   2D echo 08/05/2016: - Left ventricle: The cavity size was normal. There was mild focal    basal hypertrophy of the septum. Systolic function was normal.    The estimated ejection fraction was in the range of 60% to 65%.    Wall motion was normal; there were no regional wall motion    abnormalities. Left ventricular diastolic function  parameters    were normal for the patient&'s age.  - Aortic valve: Calcified annulus. Probably trileaflet. There was    mild stenosis, based on peak velocity and mean gradient. There    was moderate regurgitation. __________   2D echo 03/17/2012: - Left ventricle: The cavity size was normal. There was mild    to moderate concentric hypertrophy. Systolic function was    normal. The estimated ejection fraction was in the range    of 55% to 60%. Wall motion was normal; there were no    regional wall motion abnormalities. Doppler parameters are    consistent with abnormal left ventricular relaxation    (grade 1 diastolic dysfunction).  - Aortic valve: Mild regurgitation.  - Mitral valve: Mild regurgitation.  - Right ventricle: Systolic function was normal.  - Pulmonary arteries: Systolic pressure was within the    normal range.    EKG:  EKG  is ordered today.  The EKG ordered today demonstrates NSR, 67 bpm, RBBB, unchanged from prior tracing  Recent Labs: 01/28/2023: ALT 20; BUN 12; Creatinine, Ser 0.84; Hemoglobin 12.3; Platelets 163.0; Potassium 4.4; Sodium 138; TSH 1.33  Recent Lipid Panel    Component Value Date/Time   CHOL 138 01/28/2023 1158   CHOL 201 (H) 09/18/2013 1016   TRIG 94.0 01/28/2023 1158   HDL 62.40 01/28/2023 1158   HDL 53 09/18/2013 1016   CHOLHDL 2 01/28/2023 1158   VLDL 18.8 01/28/2023 1158   LDLCALC 57 01/28/2023 1158   LDLCALC 130 (H) 09/18/2013 1016   LDLDIRECT 209.2 05/28/2009 0909    PHYSICAL EXAM:    VS:  BP 120/66 (BP Location: Left Arm, Patient Position: Sitting, Cuff Size: Normal)   Pulse 70   Ht 5\' 7"  (1.702 m)   Wt 232 lb 6 oz (105.4 kg)   SpO2 97%   BMI 36.40 kg/m   BMI: Body mass index is 36.4 kg/m.  Physical Exam Vitals reviewed.  Constitutional:      Appearance: She is well-developed.  HENT:     Head: Normocephalic and atraumatic.  Eyes:     General:        Right eye: No discharge.        Left eye: No discharge.  Neck:     Vascular: No JVD.  Cardiovascular:     Rate and Rhythm: Normal rate and regular rhythm.     Heart sounds: S1 normal and S2 normal. Heart sounds not distant. No midsystolic click and no opening snap. Murmur heard.     Harsh midsystolic murmur is present with a grade of 3/6 at the upper right sternal border radiating to the neck.     No friction rub.  Pulmonary:     Effort: Pulmonary effort is normal. No respiratory distress.     Breath sounds: Normal breath sounds. No decreased breath sounds, wheezing, rhonchi or rales.  Chest:     Chest wall: No tenderness.  Abdominal:     General: There is no distension.  Musculoskeletal:     Cervical back: Normal range of motion.     Right lower leg: No edema.     Left lower leg: No edema.  Skin:    General: Skin is warm and dry.     Nails: There is no clubbing.  Neurological:     Mental Status: She is  alert and oriented to person, place, and time.  Psychiatric:        Speech: Speech normal.  Behavior: Behavior normal.        Thought Content: Thought content normal.        Judgment: Judgment normal.     Wt Readings from Last 3 Encounters:  09/22/23 232 lb 6 oz (105.4 kg)  08/26/23 231 lb 2 oz (104.8 kg)  02/10/23 225 lb 8 oz (102.3 kg)     ASSESSMENT & PLAN:   Aortic valve stenosis/insufficiency: Echo from early this week showed progression of aortic stenosis to now severe with a mean gradient of 36 mmHg and a valve area of 0.7 cm (previously 30 mmHg and 1.53 cm.  Suspect her progressive aortic valve disease is contributing to her underlying dyspnea.  Schedule R/LHC for further evaluation with recommendations for potential TAVR versus SAVR based on cardiac cath findings.  Nonobstructive CAD with exertional dyspnea: Prior LHC in 2021 showed nonobstructive disease.  Lexiscan MPI in 08/2022 showed no evidence of ischemia.  Pursue R/LHC for further evaluation of progressive, now severe, aortic stenosis.  Continue aggressive risk factor modification and primary prevention including aspirin, ezetimibe, metoprolol tartrate, and rosuvastatin.  HLD: LDL 57 in 01/2023.  Remains on rosuvastatin 40 mg and ezetimibe.  Bilateral carotid artery bruit: Carotid artery ultrasound in 06/2021 showed less than 40% bilateral ICA stenosis.  Remains on aspirin and rosuvastatin as outlined above.  Tobacco use: Quit in 01/2023.   Informed Consent   Shared Decision Making/Informed Consent{  The risks [stroke (1 in 1000), death (1 in 1000), kidney failure [usually temporary] (1 in 500), bleeding (1 in 200), allergic reaction [possibly serious] (1 in 200)], benefits (diagnostic support and management of coronary artery disease) and alternatives of a cardiac catheterization were discussed in detail with Ms. Dilworth and she is willing to proceed.        Disposition: F/u with Dr. Kirke Corin or an APP 1-2 weeks  after [facility].   Medication Adjustments/Labs and Tests Ordered: Current medicines are reviewed at length with the patient today.  Concerns regarding medicines are outlined above. Medication changes, Labs and Tests ordered today are summarized above and listed in the Patient Instructions accessible in Encounters.   Signed, Eula Listen, PA-C 09/22/2023 2:56 PM     [facility] [place] [address] [place], [place] [address] [phone]

## 2023-09-21 NOTE — Progress Notes (Unsigned)
Cardiology Office Note    Date:  09/22/2023   ID:  Amanda [name], DOB 26-Apr-1951, MRN [id number]  PCP:  Hannah Beat, MD  Cardiologist:  Lorine Bears, MD  Electrophysiologist:  None   Chief Complaint: Follow-up  History of Present Illness:   Amanda [name] is a 72 y.o. female with history of nonobstructive CAD, severe aortic valve stenosis, mild to moderate aortic insufficiency, diet-controlled diabetes, COPD, HTN, HLD, ongoing tobacco use, mild positional OSA, and mild cognitive impairment followed by neurology who presents for follow-up of echo.   Coronary CTA in 08/2018 showed a calcium score of 521 with moderate nonobstructive disease involving the LAD and RCA that were not significant by ctFFR.  Echo in 08/2019 demonstrated an EF of 60 to 65% with moderate aortic stenosis with a mean gradient of 19 mmHg and a valve area of 1.28 cm.  Repeat echo in 11/2020 showed an EF of 60 to 65%, grade 1 diastolic dysfunction, and moderate aortic stenosis with a mean gradient of 22 mmHg and a valve area of 1.29 cm.  She was admitted to the hospital in 11/2020 with chest pain and worsening exertional dyspnea.  R/LHC showed mild nonobstructive CAD with RHC showing normal filling pressures, normal pulmonary pressure, and normal cardiac output.  Aortic stenosis was moderate with a mean gradient of 24 mmHg and a valve area of 1.44 cm.  Carotid artery ultrasound in 06/2021 showed 1 to 39% bilateral ICA stenoses with antegrade flow of the bilateral vertebral arteries and normal flow hemodynamics of the bilateral subclavian arteries.  She fell in 07/2021 leading to 2 broken ribs.  She was admitted in 11/2021 with right hand cellulitis/abscess due to an infected cat bite.  Echo during that admission demonstrated an EF of 65 to 70%, no regional wall motion abnormalities, mild LVH, normal LV diastolic function parameters, normal RV systolic function and ventricular cavity size, mild to moderate aortic valve  insufficiency, and moderate aortic valve stenosis with a mean gradient of 29.2 mmHg.  She was seen in 07/2022 and forgot to mention at her prior visit that she had 2 episodes of chest discomfort that improved with husband's SL NTG.  She also noted some lower extremity heaviness with ambulation.  Lexiscan MPI on 07/16/2022 showed no evidence of ischemia or infarction with normal LVEF and was overall low risk.  ABI normal bilaterally in 08/2022.  Echo in 11/2022 showed an EF of 65 to 70%, no regional wall motion abnormalities, mild LVH, grade 1 diastolic dysfunction mild aortic insufficiency, moderate aortic stenosis with a mean gradient of 30 mmHg and a valve area of 1.53 cm, and an estimated right atrial pressure of 8 mmHg.   She underwent MRI of the brain in 01/2023 for evaluation of memory loss, ataxia falls, and gait disturbance.  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  She subsequently followed up with ENT and reported waxing and waning swelling in this area.  Follow-up ultrasound showed a 1.1 cm simple appearing cyst within the right parotid gland.  She was last seen in the office in 08/2023 and was without symptoms of angina or cardiac decompensation.  Prior dizziness/lightheadedness improved with gait stability physical therapy.  To follow-up on her aortic valve stenosis/insufficiency she  underwent echo on 09/20/2023, that showed an EF of 60 to 65%, no regional wall motion abnormalities, mild LVH, grade 1 diastolic dysfunction, normal RV systolic function and ventricular cavity size, mild mitral regurgitation, severe aortic stenosis with a mean gradient of 36 mmHg and a valve area of 0.7 cm, mild aortic insufficiency, and an estimated right atrial  pressure of 8 mmHg.  When compared to echo from 11/2022, her aortic stenosis has progressed with a prior mean gradient of 30 mmHg and a valve area of 1.53 cm.  Given progressive aortic valve stenosis, appointment was scheduled for today.  She comes in today and is without symptoms of angina or cardiac decompensation.  She does notice some increased shortness of breath with exertion that improves with rest.  No palpitations.  No lower extremity swelling or progressive orthopnea.  No PND or abdominal distention.  No dizziness, presyncope, or syncope.  Continues to abstain from tobacco use.  Reports her mother had to undergo AVR.   Labs independently reviewed: 01/2023 - potassium 4.4, BUN 12, serum creatinine 0.84, Hgb 12.3, PLT 163, albumin 4.2, AST/ALT normal, A1c 6.4, TSH normal, TC 138, TG 94, HDL 62, LDL 57   Past Medical History:  Diagnosis Date   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 2013: nl LV sys fxn, mild MR, no evidence of pulm htn; b. TTE 8/17: EF 60-65%, no RWMA, nl LV dia fxn,  mild AS, mod AI   CAD (coronary artery disease), native coronary artery 11/26/2020   Controlled type 2 diabetes mellitus with diabetic autonomic neuropathy, without long-term current use of insulin (HCC) 08/10/2007   Qualifier: Diagnosis of  By: Hetty Ely MD, Franne Grip    COPD (chronic obstructive pulmonary disease) (HCC) 10/30/2016   Depression    Fracture in accidental fall    GAD (generalized anxiety disorder)    GERD (gastroesophageal reflux disease)    Hiatal hernia with gastroesophageal reflux 1997   Hyperlipidemia    Hypertension    Osteoporosis    Tobacco abuse     Past Surgical History:  Procedure Laterality Date   ABDOMINAL HYSTERECTOMY  1993   ANTERIOR CERVICAL DECOMP/DISCECTOMY FUSION N/A 10/26/2018   Procedure: ACDF C4-C5 C5-C6 C6-C7;  Surgeon: Donalee Citrin, MD;  Location: [facility] OR;  Service: Neurosurgery;  Laterality: N/A;   APPENDECTOMY     BACK SURGERY     BREAST  BIOPSY Right 2007   benign   CHOLECYSTECTOMY     COLONOSCOPY     30 years ago was normal per pt.    DILATION AND CURETTAGE OF UTERUS     ESOPHAGEAL DILATION     x 3   ESOPHAGOGASTRODUODENOSCOPY (EGD) WITH PROPOFOL N/A 02/06/2022   Procedure: ESOPHAGOGASTRODUODENOSCOPY (EGD) WITH PROPOFOL;  Surgeon: Wyline Mood, MD;  Location: [facility] ENDOSCOPY;  Service: Gastroenterology;  Laterality: N/A;   ESOPHAGOGASTRODUODENOSCOPY (EGD) WITH PROPOFOL N/A 02/26/2022   Procedure: ESOPHAGOGASTRODUODENOSCOPY (EGD) WITH PROPOFOL;  Surgeon: Wyline Mood, MD;  Location: [facility] ENDOSCOPY;  Service: Gastroenterology;  Laterality: N/A;  EGD+dilation per Dr. Tobi Bastos   fractured leg Right    INCISION AND DRAINAGE Right 12/02/2021   Procedure: INCISION AND DRAINAGE;  Surgeon: Kennedy Bucker, MD;  Location: [facility] ORS;  Service: Orthopedics;  Laterality: Right;   LEFT HEART CATH AND CORONARY ANGIOGRAPHY N/A 11/11/2020   Procedure: LEFT HEART CATH AND CORONARY ANGIOGRAPHY;  Surgeon: Iran Ouch, MD;  Location: [facility] INVASIVE CV LAB;  Service: Cardiovascular;  Laterality: N/A;  OOPHORECTOMY     OVARIAN CYST REMOVAL  1972   SALIVARY GLAND SURGERY     TONSILLECTOMY     4 yoa   TOTAL KNEE ARTHROPLASTY Left 12/24/2015   Procedure: LEFT TOTAL KNEE ARTHROPLASTY;  Surgeon: Cammy Copa, MD;  Location: [facility] OR;  Service: Orthopedics;  Laterality: Left;   TOTAL KNEE ARTHROPLASTY Right 12/30/2017   Procedure: RIGHT TOTAL KNEE ARTHROPLASTY;  Surgeon: Cammy Copa, MD;  Location: [facility] OR;  Service: Orthopedics;  Laterality: Right;   WRIST FRACTURE SURGERY  11/2008    Current Medications: Current Meds  Medication Sig   acetaminophen (TYLENOL) 650 MG CR tablet Take 650 mg by mouth 3 times/day as needed-between meals & bedtime (for headaches).   albuterol (VENTOLIN HFA) 108 (90 Base) MCG/ACT inhaler Inhale 2 puffs into the lungs every 4 (four) hours as needed for wheezing or shortness of breath.   amitriptyline (ELAVIL) 50 MG  tablet Take 0.5 tablets (25 mg total) by mouth at bedtime.   Ascorbic Acid (VITAMIN C) 1000 MG tablet Take 1,000 mg by mouth daily.   aspirin EC 81 MG tablet Take 81 mg by mouth daily.   buPROPion (WELLBUTRIN XL) 150 MG 24 hr tablet TAKE 1 TABLET EVERY DAY   calcium carbonate (TUMS - DOSED IN MG ELEMENTAL CALCIUM) 500 MG chewable tablet Chew 2 tablets by mouth daily as needed for indigestion or heartburn.   Cholecalciferol (VITAMIN D) 1000 UNITS capsule Take 1,000 Units by mouth daily.    clonazePAM (KLONOPIN) 0.5 MG tablet Take 1 tablet (0.5 mg total) by mouth 2 (two) times daily as needed.   cyclobenzaprine (FLEXERIL) 10 MG tablet Take 1 tablet by mouth three times daily as needed for muscle spasm   dimenhyDRINATE (DRAMAMINE) 50 MG tablet Take 50 mg by mouth every 8 (eight) hours as needed.   donepezil (ARICEPT) 5 MG tablet TAKE 1 TABLET BY MOUTH AT BEDTIME   ezetimibe (ZETIA) 10 MG tablet Take 1 tablet (10 mg total) by mouth daily.   FLUoxetine (PROZAC) 40 MG capsule TAKE 2 CAPSULES EVERY DAY   fluticasone (FLONASE) 50 MCG/ACT nasal spray Place 2 sprays into both nostrils daily.   gabapentin (NEURONTIN) 100 MG capsule Take 1 capsule (100 mg total) by mouth 2 (two) times daily.   guaiFENesin (MUCINEX) 600 MG 12 hr tablet Take 600 mg by mouth daily.   metoprolol tartrate (LOPRESSOR) 25 MG tablet TAKE 1/2 TABLET TWICE DAILY   Multiple Vitamin (MULTIVITAMIN) tablet Take 1 tablet by mouth daily.   naproxen sodium (ALEVE) 220 MG tablet Take 220 mg by mouth daily.   ondansetron (ZOFRAN-ODT) 4 MG disintegrating tablet Take 1 tablet (4 mg total) by mouth every 8 (eight) hours as needed for nausea or vomiting.   pantoprazole (PROTONIX) 40 MG tablet TAKE 1 TABLET 30 MINUTES PRIOR TO BREAKFAST   rosuvastatin (CRESTOR) 40 MG tablet Take 1 tablet (40 mg total) by mouth daily.   Simethicone 125 MG CAPS Take 125 mg by mouth daily as needed (gas).   vitamin E 180 MG (400 UNITS) capsule Take 400 Units by  mouth daily.    Allergies:   Honey bee venom [bee venom] and Diazepam   Social History   Socioeconomic History   Marital status: Married    Spouse name: Not on file   Number of children: 2   Years of education: Not on file   Highest education level: Not on file  Occupational History   Occupation: Landscape architect  Employer: [employer]  Tobacco Use   Smoking status: Former    Average packs/day: 0.8 packs/day for 45.0 years (33.8 ttl pk-yrs)    Types: Cigarettes    Start date: 01/2023   Smokeless tobacco: Never  Vaping Use   Vaping status: Never Used  Substance and Sexual Activity   Alcohol use: Yes    Alcohol/week: 0.0 standard drinks of alcohol    Comment: glass of wine twice a month    Drug use: No   Sexual activity: Not Currently  Other Topics Concern   Not on file  Social History Narrative   Not on file   Social Determinants of Health   Financial Resource Strain: Low Risk  (12/18/2022)   Overall Financial Resource Strain (CARDIA)    Difficulty of Paying Living Expenses: Not hard at all  Food Insecurity: No Food Insecurity (12/18/2022)   Hunger Vital Sign    Worried About Running Out of Food in the Last Year: Never true    Ran Out of Food in the Last Year: Never true  Transportation Needs: No Transportation Needs (12/18/2022)   PRAPARE - Administrator, Civil Service (Medical): No    Lack of Transportation (Non-Medical): No  Physical Activity: Unknown (12/18/2022)   Exercise Vital Sign    Days of Exercise per Week: 0 days    Minutes of Exercise per Session: Not on file  Stress: No Stress Concern Present (12/18/2022)   [employer] of Occupational Health - Occupational Stress Questionnaire    Feeling of Stress : Not at all  Social Connections: Unknown (12/18/2022)   Social Connection and Isolation Panel [NHANES]    Frequency of Communication with Friends and Family: Not on file    Frequency of Social Gatherings with  Friends and Family: More than three times a week    Attends Religious Services: Not on Marketing executive or Organizations: Not on file    Attends Banker Meetings: Not on file    Marital Status: Married     Family History:  The patient's family history includes Arthritis in her mother; Breast cancer in her sister; Cancer in her paternal grandmother; Colon polyps in her mother and sister; Diabetes in her mother; Heart disease in her mother; Hypercholesterolemia in her sister; Hyperlipidemia in her mother. There is no history of Colon cancer, Esophageal cancer, Rectal cancer, Stomach cancer, or Pancreatic cancer.  ROS:   12-point review of systems is negative unless otherwise noted in the HPI.   EKGs/Labs/Other Studies Reviewed:    Studies reviewed were summarized above. The additional studies were reviewed today:  2D echo 09/20/2023: 1. Left ventricular ejection fraction, by estimation, is 60 to 65%. The  left ventricle has normal function. The left ventricle has no regional  wall motion abnormalities. There is mild left ventricular hypertrophy.  Left ventricular diastolic parameters  are consistent with Grade I diastolic dysfunction (impaired relaxation).   2. Right ventricular systolic function is normal. The right ventricular  size is normal.   3. The mitral valve is degenerative. Mild mitral valve regurgitation.   4. AV mean gradient , PG , DVI 0.24, AVA 0.7cm2. The aortic  valve is calcified. Aortic valve regurgitation is mild. Severe aortic  valve stenosis.   5. The inferior vena cava is dilated in size with >50% respiratory  variability, suggesting right atrial pressure of 8 mmHg.  __________  2D echo 11/11/2022: 1. Left ventricular ejection fraction, by estimation, is  65 to 70%. The  left ventricle has normal function. The left ventricle has no regional  wall motion abnormalities. There is mild left ventricular hypertrophy.  Left  ventricular diastolic parameters  are consistent with Grade I diastolic dysfunction (impaired relaxation).   2. Right ventricular systolic function is normal. The right ventricular  size is normal.   3. The mitral valve is degenerative. No evidence of mitral valve  regurgitation.   4. Mean aortic valve gradient , peak gradient , Vmax 3.73m/s,  DVI 0.37.Marland Kitchen The aortic valve is calcified. Aortic valve regurgitation is  mild. Moderate aortic valve stenosis. Aortic valve area, by VTI measures  1.53 cm.   5. The inferior vena cava is dilated in size with >50% respiratory  variability, suggesting right atrial pressure of 8 mmHg.  ___________   2D echo 12/02/2021: 1. Left ventricular ejection fraction, by estimation, is 65 to 70%. The  left ventricle has normal function. The left ventricle has no regional  wall motion abnormalities. There is mild left ventricular hypertrophy.  Left ventricular diastolic parameters  were normal.   2. Right ventricular systolic function is normal. The right ventricular  size is normal.   3. The mitral valve is degenerative. No evidence of mitral valve  regurgitation.   4. The aortic valve is calcified. Aortic valve regurgitation is mild to  moderate. Moderate aortic valve stenosis. Aortic valve mean gradient  measures 29.2 mmHg. Aortic valve Vmax measures 3.35 m/s.   5. The inferior vena cava is normal in size with greater than 50%  respiratory variability, suggesting right atrial pressure of 3 mmHg __________   Carotid artery ultrasound 07/04/2021: Summary:  Right Carotid: Velocities in the right ICA are consistent with a 1-39%  stenosis. Non-hemodynamically significant plaque <50% noted in the  CCA. The ECA appears <50% stenosed.   Left Carotid: Velocities in the left ICA are consistent with a 1-39%  stenosis. Non-hemodynamically significant plaque <50% noted in the CCA. The ECA appears <50% stenosed.   Vertebrals:  Bilateral vertebral  arteries demonstrate antegrade flow.  Subclavians: Normal flow hemodynamics were seen in bilateral subclavian arteries. __________   LHC 11/11/2020: 1.  Mild nonobstructive coronary artery disease.  Tortuous vessels overall suggestive of chronic hypertension. 2.  Left ventricular angiography was not performed.  EF was normal by echo. 3.  Right heart catheterization showed normal filling pressures, normal pulmonary pressure and normal cardiac output. 4.  Moderate aortic stenosis with mean gradient of 24 mmHg and valve area of 1.44 cm.   Recommendations: No culprit is identified for the patient's chest pain.  Recommend medical therapy for nonobstructive coronary artery disease. Continue to monitor aortic stenosis with echocardiograms. The patient can be discharged home from a cardiac standpoint.  No other issues. __________   2D echo 12/2/201.  1. Left ventricular ejection fraction, by estimation, is 60 to 65%. The  left ventricle has normal function. The left ventricle has no regional  wall motion abnormalities. There is mild to moderate left ventricular  hypertrophy. Left ventricular diastolic  parameters are consistent with Grade I diastolic dysfunction (impaired  relaxation). The average left ventricular global longitudinal strain is  -15.3 %. The global longitudinal strain is abnormal.   2. Right ventricular systolic function is normal. The right ventricular  size is normal.   3. Left atrial size was mildly dilated.   4. The aortic valve is moderately calcified. Aortic valve regurgitation  is mild. Moderate aortic valve stenosis. Aortic valve area, by VTI  measures 1.29  cm. Aortic valve mean gradient measures 22.2 mmHg. Aortic  valve Vmax measures 3.34 m/s. __________   2D echo 08/23/2019: 1. The left ventricle has normal systolic function with an ejection  fraction of 60-65%. The cavity size was normal. There is mild concentric  left ventricular hypertrophy. Left ventricular  diastolic Doppler  parameters are consistent with impaired  relaxation.   2. The right ventricle has normal systolic function. The cavity was  normal. There is No increase in right ventricular wall thickness.   3. Left atrial size was normal.   4. Right atrial size was normal.   5. The mitral valve is abnormal. mild calcification of the mitral valve  leaflet.   6. The aortic valve is abnormal. Moderate sclerosis of the aortic valve.  Aortic valve regurgitation is mild by color flow Doppler. Mild-moderate  stenosis of the aortic valve. DVI 0.36   7. The aorta is normal unless otherwise noted.   8. The pulmonic valve was not well visualized. Pulmonic valve  regurgitation was not assessed by color flow Doppler. __________   Coronary CTA 10/05/2018: FINDINGS: Non-cardiac: See separate report from [facility]. No significant findings on limited lung and soft tissue windows.   Calcium score: Dense calcium noted in LAD and RCA   Coronary Arteries: Right dominant with no anomalies   LM: Distal LM less than 50% calcified stenosis   LAD: Ostial and mid LAD less than 50% calcific stenosis   D1: Small vessel less than 30% mixed plaque   D2: Large branching vessel 50% calcific plaque proximally   Circumflex: Less than 50% calcified plaque at ostium   OM1: Small branch without significant disease   RCA: 50% or less calcific plaque in proximal mid and distal vessel   PDA: Less than 30% calcific plaque   PLA: Less than 30% calcific plaque   IMPRESSION: 1. Calcific aortic atherosclerosis moderate Normal aortic root 2.9 cm 2.  Calcified Aortic Valve suggest TTE correlation for AS 3.  Calcium score 521 which is 94 th percentile for age and sex 85. Moderate non obstructive appearing disease primarily in RCA and LAD as well as D2 Study will be sent for FFR CT __________   2D echo 08/17/2018: - Left ventricle: The cavity size was at the upper limits of    normal. Wall  thickness was increased in a pattern of mild LVH.    Systolic function was normal. The estimated ejection fraction was    in the range of 60% to 65%. Wall motion was normal; there were no    regional wall motion abnormalities. Doppler parameters are    consistent with abnormal left ventricular relaxation (grade 1    diastolic dysfunction).  - Aortic valve: Transvalvular velocity was increased. There was    mild to moderate stenosis. There was mild to moderate    regurgitation. Mean gradient (S): 19 mm Hg. Valve area (VTI):    1.28 cm^2.  - Mitral valve: Calcified annulus.  - Left atrium: The atrium was mildly dilated.  - Right ventricle: The cavity size was normal. Systolic function    was normal. ___________   2D echo 08/05/2016: - Left ventricle: The cavity size was normal. There was mild focal    basal hypertrophy of the septum. Systolic function was normal.    The estimated ejection fraction was in the range of 60% to 65%.    Wall motion was normal; there were no regional wall motion    abnormalities. Left ventricular diastolic function  parameters    were normal for the patient&'s age.  - Aortic valve: Calcified annulus. Probably trileaflet. There was    mild stenosis, based on peak velocity and mean gradient. There    was moderate regurgitation. __________   2D echo 03/17/2012: - Left ventricle: The cavity size was normal. There was mild    to moderate concentric hypertrophy. Systolic function was    normal. The estimated ejection fraction was in the range    of 55% to 60%. Wall motion was normal; there were no    regional wall motion abnormalities. Doppler parameters are    consistent with abnormal left ventricular relaxation    (grade 1 diastolic dysfunction).  - Aortic valve: Mild regurgitation.  - Mitral valve: Mild regurgitation.  - Right ventricle: Systolic function was normal.  - Pulmonary arteries: Systolic pressure was within the    normal range.    EKG:  EKG  is ordered today.  The EKG ordered today demonstrates NSR, 67 bpm, RBBB, unchanged from prior tracing  Recent Labs: 01/28/2023: ALT 20; BUN 12; Creatinine, Ser 0.84; Hemoglobin 12.3; Platelets 163.0; Potassium 4.4; Sodium 138; TSH 1.33  Recent Lipid Panel    Component Value Date/Time   CHOL 138 01/28/2023 1158   CHOL 201 (H) 09/18/2013 1016   TRIG 94.0 01/28/2023 1158   HDL 62.40 01/28/2023 1158   HDL 53 09/18/2013 1016   CHOLHDL 2 01/28/2023 1158   VLDL 18.8 01/28/2023 1158   LDLCALC 57 01/28/2023 1158   LDLCALC 130 (H) 09/18/2013 1016   LDLDIRECT 209.2 05/28/2009 0909    PHYSICAL EXAM:    VS:  BP 120/66 (BP Location: Left Arm, Patient Position: Sitting, Cuff Size: Normal)   Pulse 70   Ht 5\' 7"  (1.702 m)   Wt 232 lb 6 oz (105.4 kg)   SpO2 97%   BMI 36.40 kg/m   BMI: Body mass index is 36.4 kg/m.  Physical Exam Vitals reviewed.  Constitutional:      Appearance: She is well-developed.  HENT:     Head: Normocephalic and atraumatic.  Eyes:     General:        Right eye: No discharge.        Left eye: No discharge.  Neck:     Vascular: No JVD.  Cardiovascular:     Rate and Rhythm: Normal rate and regular rhythm.     Heart sounds: S1 normal and S2 normal. Heart sounds not distant. No midsystolic click and no opening snap. Murmur heard.     Harsh midsystolic murmur is present with a grade of 3/6 at the upper right sternal border radiating to the neck.     No friction rub.  Pulmonary:     Effort: Pulmonary effort is normal. No respiratory distress.     Breath sounds: Normal breath sounds. No decreased breath sounds, wheezing, rhonchi or rales.  Chest:     Chest wall: No tenderness.  Abdominal:     General: There is no distension.  Musculoskeletal:     Cervical back: Normal range of motion.     Right lower leg: No edema.     Left lower leg: No edema.  Skin:    General: Skin is warm and dry.     Nails: There is no clubbing.  Neurological:     Mental Status: She is  alert and oriented to person, place, and time.  Psychiatric:        Speech: Speech normal.  Behavior: Behavior normal.        Thought Content: Thought content normal.        Judgment: Judgment normal.     Wt Readings from Last 3 Encounters:  09/22/23 232 lb 6 oz (105.4 kg)  08/26/23 231 lb 2 oz (104.8 kg)  02/10/23 225 lb 8 oz (102.3 kg)     ASSESSMENT & PLAN:   Aortic valve stenosis/insufficiency: Echo from early this week showed progression of aortic stenosis to now severe with a mean gradient of 36 mmHg and a valve area of 0.7 cm (previously 30 mmHg and 1.53 cm.  Suspect her progressive aortic valve disease is contributing to her underlying dyspnea.  Schedule R/LHC for further evaluation with recommendations for potential TAVR versus SAVR based on cardiac cath findings.  Nonobstructive CAD with exertional dyspnea: Prior LHC in 2021 showed nonobstructive disease.  Lexiscan MPI in 08/2022 showed no evidence of ischemia.  Pursue R/LHC for further evaluation of progressive, now severe, aortic stenosis.  Continue aggressive risk factor modification and primary prevention including aspirin, ezetimibe, metoprolol tartrate, and rosuvastatin.  HLD: LDL 57 in 01/2023.  Remains on rosuvastatin 40 mg and ezetimibe.  Bilateral carotid artery bruit: Carotid artery ultrasound in 06/2021 showed less than 40% bilateral ICA stenosis.  Remains on aspirin and rosuvastatin as outlined above.  Tobacco use: Quit in 01/2023.   Informed Consent   Shared Decision Making/Informed Consent{  The risks [stroke (1 in 1000), death (1 in 1000), kidney failure [usually temporary] (1 in 500), bleeding (1 in 200), allergic reaction [possibly serious] (1 in 200)], benefits (diagnostic support and management of coronary artery disease) and alternatives of a cardiac catheterization were discussed in detail with Ms. Dilworth and she is willing to proceed.        Disposition: F/u with Dr. Kirke Corin or an APP 1-2 weeks  after [facility].   Medication Adjustments/Labs and Tests Ordered: Current medicines are reviewed at length with the patient today.  Concerns regarding medicines are outlined above. Medication changes, Labs and Tests ordered today are summarized above and listed in the Patient Instructions accessible in Encounters.   Signed, Eula Listen, PA-C 09/22/2023 2:56 PM     [facility] [place] [address] [place], [place] [address] [phone]

## 2023-09-22 ENCOUNTER — Ambulatory Visit: Payer: Medicare Other | Attending: Physician Assistant | Admitting: Physician Assistant

## 2023-09-22 ENCOUNTER — Encounter: Payer: Self-pay | Admitting: Physician Assistant

## 2023-09-22 VITALS — BP 120/66 | HR 70 | Ht 67.0 in | Wt 232.4 lb

## 2023-09-22 DIAGNOSIS — I351 Nonrheumatic aortic (valve) insufficiency: Secondary | ICD-10-CM | POA: Diagnosis not present

## 2023-09-22 DIAGNOSIS — Z720 Tobacco use: Secondary | ICD-10-CM | POA: Insufficient documentation

## 2023-09-22 DIAGNOSIS — R0989 Other specified symptoms and signs involving the circulatory and respiratory systems: Secondary | ICD-10-CM | POA: Insufficient documentation

## 2023-09-22 DIAGNOSIS — I350 Nonrheumatic aortic (valve) stenosis: Secondary | ICD-10-CM | POA: Diagnosis not present

## 2023-09-22 DIAGNOSIS — E785 Hyperlipidemia, unspecified: Secondary | ICD-10-CM | POA: Insufficient documentation

## 2023-09-22 DIAGNOSIS — I2510 Atherosclerotic heart disease of native coronary artery without angina pectoris: Secondary | ICD-10-CM | POA: Insufficient documentation

## 2023-09-22 NOTE — Patient Instructions (Addendum)
Medication Instructions:   Your physician recommends that you continue on your current medications as directed. Please refer to the Current Medication list given to you today.  *If you need a refill on your cardiac medications before your next appointment, please call your pharmacy*   Lab Work:  CBC/BMET  If you have labs (blood work) drawn today and your tests are completely normal, you will receive your results only by: MyChart Message (if you have MyChart) OR A paper copy in the mail If you have any lab test that is abnormal or we need to change your treatment, we will call you to review the results.   Testing/Procedures:  [place] [facility] [place] [address] [place] [address] Dept: [phone] Loc: [phone]  Amanda [name]  09/22/2023  You are scheduled for a Right/Left Cardiac Catheterization on Wednesday, October 23 with Dr. Lorine Bears.  1. Please arrive at the [facility] Entrance of [facility], [address], [place] [address] at 8:30 AM (This is 1 hour(s) prior to your procedure time).  Proceed to the Check-In Desk directly inside the entrance.  Procedure Parking: Use the entrance off of the [address] side of the hospital. Turn right upon entering and follow the driveway to parking that is directly in front of the [facility] Vascular Center. There is no valet parking available at this entrance, however there is an awning directly in front of the [facility] Vascular Center for drop off/ pick up for patients.  Special note: Every effort is made to have your procedure done on time. Please understand that emergencies sometimes delay scheduled procedures.  2. Diet: Do not eat solid foods after midnight.  The patient may have clear liquids until 5am upon the day of the procedure.  3. Labs: You will need to have blood drawn Today in office labs:  BMET/CBC  4. Medication instructions in preparation for your procedure:   Contrast Allergy: No   On the morning of your procedure, take your Aspirin 81 mg and any morning medicines NOT listed above.  You may use sips of water.  5. Plan to go home the same day, you will only stay overnight if medically necessary. 6. Bring a current list of your medications and current insurance cards. 7. You MUST have a responsible person to drive you home. 8. Someone MUST be with you the first 24 hours after you arrive home or your discharge will be delayed. 9. Please wear clothes that are easy to get on and off and wear slip-on shoes.  Thank you for allowing [place] to care for you!   -- [facility]    Follow-Up: At [facility], you and your health needs are our priority.  As part of our continuing mission to provide you with exceptional heart care, we have created designated Provider Care Teams.  These Care Teams include your primary Cardiologist (physician) and Advanced Practice Providers (APPs -  Physician Assistants and Nurse Practitioners) who all work together to provide you with the care you need, when you need it.  We recommend signing up for the patient portal called "MyChart".  Sign up information is provided on this After Visit Summary.  MyChart is used to connect with patients for Virtual Visits (Telemedicine).  Patients are able to view lab/test results, encounter notes, upcoming appointments, etc.  Non-urgent messages can be sent to  your provider as well.   To learn more about what you can do with MyChart, go to [email or web address].    Your next appointment:   1 week(s)  Provider:   You may see Lorine Bears, MD or one of the following Advanced Practice Providers on your designated Care Team:   Nicolasa Ducking, NP Eula Listen, PA-C Cadence Fransico Michael, PA-C Charlsie Quest, NP

## 2023-09-23 ENCOUNTER — Telehealth: Payer: Self-pay | Admitting: *Deleted

## 2023-09-23 LAB — BASIC METABOLIC PANEL
BUN/Creatinine Ratio: 10 — ABNORMAL LOW (ref 12–28)
BUN: 8 mg/dL (ref 8–27)
CO2: 25 mmol/L (ref 20–29)
Calcium: 9.1 mg/dL (ref 8.7–10.3)
Chloride: 106 mmol/L (ref 96–106)
Creatinine, Ser: 0.82 mg/dL (ref 0.57–1.00)
Glucose: 93 mg/dL (ref 70–99)
Potassium: 4.3 mmol/L (ref 3.5–5.2)
Sodium: 143 mmol/L (ref 134–144)
eGFR: 75 mL/min/{1.73_m2} (ref >59)

## 2023-09-23 LAB — CBC
Hematocrit: 37.3 % (ref 34.0–46.6)
Hemoglobin: 12.1 g/dL (ref 11.1–15.9)
MCH: 33.2 pg — ABNORMAL HIGH (ref 26.6–33.0)
MCHC: 32.4 g/dL (ref 31.5–35.7)
MCV: 103 fL — ABNORMAL HIGH (ref 79–97)
Platelets: 153 10*3/uL (ref 150–450)
RBC: 3.64 x10E6/uL — ABNORMAL LOW (ref 3.77–5.28)
RDW: 10.7 % — ABNORMAL LOW (ref 11.7–15.4)
WBC: 4.8 10*3/uL (ref 3.4–10.8)

## 2023-09-23 NOTE — Telephone Encounter (Signed)
Lmovm to inform patient that cath for 09/29/2023 has been rescheduled to 10/04/2023 @ 9:30 am. I had spoke to pt's daughter and she mentioned that it was ok to reshedule cath for a new date that her mother would be there for the new date.

## 2023-09-29 DIAGNOSIS — I350 Nonrheumatic aortic (valve) stenosis: Secondary | ICD-10-CM

## 2023-10-04 ENCOUNTER — Encounter: Payer: Self-pay | Admitting: Cardiovascular Disease

## 2023-10-04 ENCOUNTER — Encounter
Admission: RE | Disposition: A | Discharge status: Home / Self Care | Payer: Self-pay | Source: Home / Self Care | Attending: Cardiovascular Disease

## 2023-10-04 ENCOUNTER — Ambulatory Visit
Admission: RE | Admit: 2023-10-04 | Discharge: 2023-10-04 | Disposition: A | Discharge status: Home / Self Care | Payer: Medicare Other | Attending: Cardiovascular Disease | Admitting: Cardiovascular Disease

## 2023-10-04 ENCOUNTER — Other Ambulatory Visit: Payer: Self-pay

## 2023-10-04 DIAGNOSIS — E785 Hyperlipidemia, unspecified: Secondary | ICD-10-CM | POA: Diagnosis not present

## 2023-10-04 DIAGNOSIS — I350 Nonrheumatic aortic (valve) stenosis: Secondary | ICD-10-CM

## 2023-10-04 DIAGNOSIS — I352 Nonrheumatic aortic (valve) stenosis with insufficiency: Secondary | ICD-10-CM | POA: Insufficient documentation

## 2023-10-04 DIAGNOSIS — Z87891 Personal history of nicotine dependence: Secondary | ICD-10-CM | POA: Insufficient documentation

## 2023-10-04 DIAGNOSIS — J449 Chronic obstructive pulmonary disease, unspecified: Secondary | ICD-10-CM | POA: Diagnosis not present

## 2023-10-04 DIAGNOSIS — G4733 Obstructive sleep apnea (adult) (pediatric): Secondary | ICD-10-CM | POA: Insufficient documentation

## 2023-10-04 DIAGNOSIS — I272 Pulmonary hypertension, unspecified: Secondary | ICD-10-CM | POA: Diagnosis not present

## 2023-10-04 DIAGNOSIS — I6523 Occlusion and stenosis of bilateral carotid arteries: Secondary | ICD-10-CM | POA: Diagnosis not present

## 2023-10-04 DIAGNOSIS — I10 Essential (primary) hypertension: Secondary | ICD-10-CM | POA: Insufficient documentation

## 2023-10-04 DIAGNOSIS — I2510 Atherosclerotic heart disease of native coronary artery without angina pectoris: Secondary | ICD-10-CM | POA: Insufficient documentation

## 2023-10-04 DIAGNOSIS — Z79899 Other long term (current) drug therapy: Secondary | ICD-10-CM | POA: Insufficient documentation

## 2023-10-04 DIAGNOSIS — Z7982 Long term (current) use of aspirin: Secondary | ICD-10-CM | POA: Diagnosis not present

## 2023-10-04 DIAGNOSIS — E119 Type 2 diabetes mellitus without complications: Secondary | ICD-10-CM | POA: Diagnosis not present

## 2023-10-04 HISTORY — PX: RIGHT/LEFT HEART CATH AND CORONARY ANGIOGRAPHY: CATH118266

## 2023-10-04 LAB — POCT I-STAT 7, (LYTES, BLD GAS, ICA,H+H)
Acid-base deficit: 1 mmol/L (ref 0.0–2.0)
Bicarbonate: 24.8 mmol/L (ref 20.0–28.0)
Calcium, Ion: 1.24 mmol/L (ref 1.15–1.40)
HCT: 31 % — ABNORMAL LOW (ref 36.0–46.0)
Hemoglobin: 10.5 g/dL — ABNORMAL LOW (ref 12.0–15.0)
O2 Saturation: 95 %
Potassium: 3.7 mmol/L (ref 3.5–5.1)
Sodium: 140 mmol/L (ref 135–145)
TCO2: 26 mmol/L (ref 22–32)
pCO2 arterial: 44.6 mm[Hg] (ref 32–48)
pH, Arterial: 7.353 (ref 7.35–7.45)
pO2, Arterial: 79 mm[Hg] — ABNORMAL LOW (ref 83–108)

## 2023-10-04 LAB — POCT I-STAT EG7
Acid-base deficit: 1 mmol/L (ref 0.0–2.0)
Bicarbonate: 25.2 mmol/L (ref 20.0–28.0)
Calcium, Ion: 1.19 mmol/L (ref 1.15–1.40)
HCT: 31 % — ABNORMAL LOW (ref 36.0–46.0)
Hemoglobin: 10.5 g/dL — ABNORMAL LOW (ref 12.0–15.0)
O2 Saturation: 69 %
Potassium: 3.6 mmol/L (ref 3.5–5.1)
Sodium: 138 mmol/L (ref 135–145)
TCO2: 27 mmol/L (ref 22–32)
pCO2, Ven: 47.3 mm[Hg] (ref 44–60)
pH, Ven: 7.334 (ref 7.25–7.43)
pO2, Ven: 39 mm[Hg] (ref 32–45)

## 2023-10-04 LAB — GLUCOSE, CAPILLARY: Glucose-Capillary: 130 mg/dL — ABNORMAL HIGH (ref 70–99)

## 2023-10-04 SURGERY — RIGHT/LEFT HEART CATH AND CORONARY ANGIOGRAPHY
Anesthesia: Moderate Sedation | Laterality: Bilateral

## 2023-10-04 MED ORDER — SODIUM CHLORIDE 0.9 % IV SOLN: INTRAVENOUS | Status: DC

## 2023-10-04 MED ORDER — HEPARIN SODIUM (PORCINE) 1000 UNIT/ML IJ SOLN
INTRAMUSCULAR | Status: AC
  Filled 2023-10-04: qty 10

## 2023-10-04 MED ORDER — HEPARIN (PORCINE) IN NACL 1000-0.9 UT/500ML-% IV SOLN
INTRAVENOUS | Status: AC
  Filled 2023-10-04: qty 1000

## 2023-10-04 MED ORDER — HEPARIN (PORCINE) IN NACL 2000-0.9 UNIT/L-% IV SOLN
INTRAVENOUS | Status: DC | PRN
  Administered 2023-10-04: 1000 mL

## 2023-10-04 MED ORDER — SODIUM CHLORIDE 0.9% FLUSH: 3.0000 mL | Freq: Two times a day (BID) | INTRAVENOUS | Status: DC

## 2023-10-04 MED ORDER — SODIUM CHLORIDE 0.9 % WEIGHT BASED INFUSION
3.0000 mL/kg/h | INTRAVENOUS | Status: AC
  Administered 2023-10-04: 3 mL/kg/h via INTRAVENOUS

## 2023-10-04 MED ORDER — VERAPAMIL HCL 2.5 MG/ML IV SOLN
INTRAVENOUS | Status: AC
  Filled 2023-10-04: qty 2

## 2023-10-04 MED ORDER — HEPARIN SODIUM (PORCINE) 1000 UNIT/ML IJ SOLN
INTRAMUSCULAR | Status: DC | PRN
  Administered 2023-10-04: 5000 [IU] via INTRAVENOUS

## 2023-10-04 MED ORDER — FENTANYL CITRATE (PF) 100 MCG/2ML IJ SOLN
INTRAMUSCULAR | Status: AC
  Filled 2023-10-04: qty 2

## 2023-10-04 MED ORDER — ASPIRIN 81 MG PO CHEW: 81.0000 mg | CHEWABLE_TABLET | ORAL | Status: DC

## 2023-10-04 MED ORDER — IOHEXOL 300 MG/ML  SOLN
INTRAMUSCULAR | Status: DC | PRN
  Administered 2023-10-04: 38 mL

## 2023-10-04 MED ORDER — SODIUM CHLORIDE 0.9 % WEIGHT BASED INFUSION: 1.0000 mL/kg/h | INTRAVENOUS | Status: DC

## 2023-10-04 MED ORDER — SODIUM CHLORIDE 0.9% FLUSH: 3.0000 mL | INTRAVENOUS | Status: DC | PRN

## 2023-10-04 MED ORDER — SODIUM CHLORIDE 0.9 % IV SOLN
250.0000 mL | INTRAVENOUS | Status: DC | PRN
Start: 2023-10-04 — End: 2023-10-04

## 2023-10-04 MED ORDER — ONDANSETRON HCL 4 MG/2ML IJ SOLN: 4.0000 mg | Freq: Four times a day (QID) | INTRAMUSCULAR | Status: DC | PRN

## 2023-10-04 MED ORDER — FENTANYL CITRATE (PF) 100 MCG/2ML IJ SOLN
INTRAMUSCULAR | Status: DC | PRN
  Administered 2023-10-04: 25 ug via INTRAVENOUS

## 2023-10-04 MED ORDER — MIDAZOLAM HCL 2 MG/2ML IJ SOLN
INTRAMUSCULAR | Status: DC | PRN
  Administered 2023-10-04: 1 mg via INTRAVENOUS

## 2023-10-04 MED ORDER — ACETAMINOPHEN 325 MG PO TABS
650.0000 mg | ORAL_TABLET | ORAL | Status: DC | PRN
Start: 2023-10-04 — End: 2023-10-04

## 2023-10-04 MED ORDER — VERAPAMIL HCL 2.5 MG/ML IV SOLN
INTRAVENOUS | Status: DC | PRN
  Administered 2023-10-04: 2.5 mg via INTRAVENOUS

## 2023-10-04 MED ORDER — MIDAZOLAM HCL 2 MG/2ML IJ SOLN
INTRAMUSCULAR | Status: AC
  Filled 2023-10-04: qty 2

## 2023-10-04 SURGICAL SUPPLY — 14 items
CATH 5FR JL3.5 JR4 ANG PIG MP (CATHETERS) IMPLANT
CATH BALLN WEDGE 5F 110CM (CATHETERS) IMPLANT
DEVICE RAD TR BAND REGULAR (VASCULAR PRODUCTS) IMPLANT
DRAPE BRACHIAL (DRAPES) IMPLANT
GLIDESHEATH SLEND SS 6F .021 (SHEATH) IMPLANT
GUIDEWIRE INQWIRE 1.5J.035X260 (WIRE) IMPLANT
INQWIRE 1.5J .035X260CM (WIRE) ×1
NDL PERC 21GX4CM (NEEDLE) IMPLANT
NEEDLE PERC 21GX4CM (NEEDLE) IMPLANT
PACK CARDIAC CATH (CUSTOM PROCEDURE TRAY) ×1 IMPLANT
PROTECTION STATION PRESSURIZED (MISCELLANEOUS) ×1
SET ATX-X65L (MISCELLANEOUS) IMPLANT
SHEATH GLIDE SLENDER 4/5FR (SHEATH) IMPLANT
STATION PROTECTION PRESSURIZED (MISCELLANEOUS) IMPLANT

## 2023-10-04 NOTE — OR Nursing (Signed)
Right groin red and excoriated, Cath lab staff notified.

## 2023-10-04 NOTE — Interval H&P Note (Signed)
History and Physical Interval Note:  10/04/2023 11:43 AM  Amanda [name]  has presented today for surgery, with the diagnosis of R and L Cath   Severe aortic stenosis.  The various methods of treatment have been discussed with the patient and family. After consideration of risks, benefits and other options for treatment, the patient has consented to  Procedure(s): RIGHT/LEFT HEART CATH AND CORONARY ANGIOGRAPHY (Bilateral) as a surgical intervention.  The patient's history has been reviewed, patient examined, no change in status, stable for surgery.  I have reviewed the patient's chart and labs.  Questions were answered to the patient's satisfaction.     Lorine Bears

## 2023-10-04 NOTE — OR Nursing (Signed)
Dr Kirke Corin ok'd encouraging po fluids after 500 ml bag complete.

## 2023-10-05 ENCOUNTER — Encounter: Payer: Self-pay | Admitting: Cardiovascular Disease

## 2023-10-06 ENCOUNTER — Ambulatory Visit: Payer: Medicare Other | Admitting: Physician Assistant

## 2023-10-06 NOTE — Progress Notes (Deleted)
Cardiology Office Note    Date:  10/06/2023   ID:  Amanda [name], DOB 1951/06/13, MRN [id number]  PCP:  Hannah Beat, MD  Cardiologist:  Lorine Bears, MD  Electrophysiologist:  None   Chief Complaint: Follow up  History of Present Illness:   Amanda [name] is a 72 y.o. female with history of nonobstructive CAD, moderate to severe aortic valve stenosis, diet-controlled diabetes, COPD, HTN, HLD, ongoing tobacco use, mild positional OSA, and mild cognitive impairment followed by neurology who presents for follow-up of R/LHC.   Coronary CTA in 08/2018 showed a calcium score of 521 with moderate nonobstructive disease involving the LAD and RCA that were not significant by ctFFR.  Echo in 08/2019 demonstrated an EF of 60 to 65% with moderate aortic stenosis with a mean gradient of 19 mmHg and a valve area of 1.28 cm.  Repeat echo in 11/2020 showed an EF of 60 to 65%, grade 1 diastolic dysfunction, and moderate aortic stenosis with a mean gradient of 22 mmHg and a valve area of 1.29 cm.  She was admitted to the hospital in 11/2020 with chest pain and worsening exertional dyspnea.  R/LHC showed mild nonobstructive CAD with RHC showing normal filling pressures, normal pulmonary pressure, and normal cardiac output.  Aortic stenosis was moderate with a mean gradient of 24 mmHg and a valve area of 1.44 cm.  Carotid artery ultrasound in 06/2021 showed 1 to 39% bilateral ICA stenoses with antegrade flow of the bilateral vertebral arteries and normal flow hemodynamics of the bilateral subclavian arteries.  She fell in 07/2021 leading to 2 broken ribs.  She was admitted in 11/2021 with right hand cellulitis/abscess due to an infected cat bite.  Echo during that admission demonstrated an EF of 65 to 70%, no regional wall motion abnormalities, mild LVH, normal LV diastolic function parameters, normal RV systolic function and ventricular cavity size, mild to moderate aortic valve insufficiency, and moderate  aortic valve stenosis with a mean gradient of 29.2 mmHg.  She was seen in 07/2022 and forgot to mention at her prior visit that she had 2 episodes of chest discomfort that improved with husband's SL NTG.  She also noted some lower extremity heaviness with ambulation.  Lexiscan MPI on 07/16/2022 showed no evidence of ischemia or infarction with normal LVEF and was overall low risk.  ABI normal bilaterally in 08/2022.  Echo in 11/2022 showed an EF of 65 to 70%, no regional wall motion abnormalities, mild LVH, grade 1 diastolic dysfunction mild aortic insufficiency, moderate aortic stenosis with a mean gradient of 30 mmHg and a valve area of 1.53 cm, and an estimated right atrial pressure of 8 mmHg.   She underwent MRI of the brain in 01/2023 for evaluation of memory loss, ataxia falls, and gait disturbance.  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  She subsequently followed up with ENT and reported waxing and waning swelling in this area.  Follow-up ultrasound showed a 1.1 cm simple appearing cyst within the right parotid gland.   She was seen in the office in 08/2023 and reported prior dizziness and lightheadedness improved with gait stability physical therapy.  To follow-up on her aortic valve stenosis/insufficiency she underwent echo on 09/20/2023, that showed an EF  of 60 to 65%, no regional wall motion abnormalities, mild LVH, grade 1 diastolic dysfunction, normal RV systolic function and ventricular cavity size, mild mitral regurgitation, severe aortic stenosis with a mean gradient of 36 mmHg and a valve area of 0.7 cm, mild aortic insufficiency, and an estimated right atrial pressure of 8 mmHg.  When compared to echo from 11/2022, her aortic stenosis has  progressed with a prior mean gradient of 30 mmHg and a valve area of 1.53 cm.  She was seen in follow-up on 09/22/2023 and reported some increased shortness of breath with exertion that improved with rest.  Subsequent R/LHC on 10/04/2023 showed mild nonobstructive disease, moderate to severe aortic stenosis with a mean gradient of 32 mmHg and a valve area 1.1 cm, normal right atrial pressure, mildly elevated wedge pressure, minimal pulmonary pretension, and normal cardiac output.  TAVR evaluation was recommended.  ***   Labs independently reviewed: 09/2023 - BUN 8, serum creatinine 0.82, potassium 4.3, Hgb 12.1, PLT 153 01/2023 - albumin 4.2, AST/ALT normal, A1c 6.4, TSH normal, TC 138, TG 94, HDL 62, LDL 57   Past Medical History:  Diagnosis Date   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 2013: nl LV sys fxn, mild MR, no evidence of pulm htn; b. TTE 8/17: EF 60-65%, no RWMA, nl LV dia fxn,  mild AS, mod AI   CAD (coronary artery disease), native coronary artery 11/26/2020   Controlled type 2 diabetes mellitus with diabetic autonomic neuropathy, without long-term current use of insulin (HCC) 08/10/2007   Qualifier: Diagnosis of  By: Hetty Ely MD, Franne Grip    COPD (chronic obstructive pulmonary disease) (HCC) 10/30/2016   Depression    Fracture in accidental fall    GAD (generalized anxiety disorder)    GERD (gastroesophageal reflux disease)    Hiatal hernia with gastroesophageal reflux 1997   Hyperlipidemia    Hypertension    Osteoporosis    Tobacco abuse     Past Surgical History:  Procedure Laterality Date   ABDOMINAL HYSTERECTOMY  1993   ANTERIOR CERVICAL DECOMP/DISCECTOMY FUSION N/A 10/26/2018   Procedure: ACDF C4-C5 C5-C6 C6-C7;  Surgeon: Donalee Citrin, MD;  Location: [facility] OR;  Service: Neurosurgery;  Laterality: N/A;   APPENDECTOMY     BACK SURGERY     BREAST BIOPSY Right 2007   benign   CHOLECYSTECTOMY     COLONOSCOPY     30 years ago was normal per pt.     DILATION AND CURETTAGE OF UTERUS     ESOPHAGEAL DILATION     x 3   ESOPHAGOGASTRODUODENOSCOPY (EGD) WITH PROPOFOL N/A 02/06/2022   Procedure: ESOPHAGOGASTRODUODENOSCOPY (EGD) WITH PROPOFOL;  Surgeon: Wyline Mood, MD;  Location: [facility] ENDOSCOPY;  Service: Gastroenterology;  Laterality: N/A;   ESOPHAGOGASTRODUODENOSCOPY (EGD) WITH PROPOFOL N/A 02/26/2022   Procedure: ESOPHAGOGASTRODUODENOSCOPY (EGD) WITH PROPOFOL;  Surgeon: Wyline Mood, MD;  Location: [facility] ENDOSCOPY;  Service: Gastroenterology;  Laterality: N/A;  EGD+dilation per Dr. Tobi Bastos   fractured leg Right    INCISION AND DRAINAGE Right 12/02/2021   Procedure: INCISION AND DRAINAGE;  Surgeon: Kennedy Bucker, MD;  Location: [facility] ORS;  Service: Orthopedics;  Laterality: Right;   LEFT HEART CATH AND CORONARY ANGIOGRAPHY N/A 11/11/2020   Procedure: LEFT HEART CATH AND CORONARY ANGIOGRAPHY;  Surgeon: Iran Ouch, MD;  Location: [facility] INVASIVE CV LAB;  Service: Cardiovascular;  Laterality: N/A;   OOPHORECTOMY     OVARIAN CYST REMOVAL  1972  RIGHT/LEFT HEART CATH AND CORONARY ANGIOGRAPHY Bilateral 10/04/2023   Procedure: RIGHT/LEFT HEART CATH AND CORONARY ANGIOGRAPHY;  Surgeon: Iran Ouch, MD;  Location: [facility] INVASIVE CV LAB;  Service: Cardiovascular;  Laterality: Bilateral;   SALIVARY GLAND SURGERY     TONSILLECTOMY     4 yoa   TOTAL KNEE ARTHROPLASTY Left 12/24/2015   Procedure: LEFT TOTAL KNEE ARTHROPLASTY;  Surgeon: Cammy Copa, MD;  Location: [facility] OR;  Service: Orthopedics;  Laterality: Left;   TOTAL KNEE ARTHROPLASTY Right 12/30/2017   Procedure: RIGHT TOTAL KNEE ARTHROPLASTY;  Surgeon: Cammy Copa, MD;  Location: [facility] OR;  Service: Orthopedics;  Laterality: Right;   WRIST FRACTURE SURGERY  11/2008    Current Medications: No outpatient medications have been marked as taking for the 10/06/23 encounter (Appointment) with Sondra Barges, PA-C.    Allergies:   Honey bee venom [bee venom] and Diazepam   Social History    Socioeconomic History   Marital status: Married    Spouse name: Not on file   Number of children: 2   Years of education: Not on file   Highest education level: Not on file  Occupational History   Occupation: [employer]    Employer: [employer]  Tobacco Use   Smoking status: Former    Average packs/day: 0.8 packs/day for 45.0 years (33.8 ttl pk-yrs)    Types: Cigarettes    Start date: 01/2023   Smokeless tobacco: Never  Vaping Use   Vaping status: Never Used  Substance and Sexual Activity   Alcohol use: Yes    Alcohol/week: 0.0 standard drinks of alcohol    Comment: glass of wine twice a month    Drug use: No   Sexual activity: Not Currently  Other Topics Concern   Not on file  Social History Narrative   Not on file   Social Determinants of Health   Financial Resource Strain: Low Risk  (12/18/2022)   Overall Financial Resource Strain (CARDIA)    Difficulty of Paying Living Expenses: Not hard at all  Food Insecurity: No Food Insecurity (12/18/2022)   Hunger Vital Sign    Worried About Running Out of Food in the Last Year: Never true    Ran Out of Food in the Last Year: Never true  Transportation Needs: No Transportation Needs (12/18/2022)   PRAPARE - Administrator, Civil Service (Medical): No    Lack of Transportation (Non-Medical): No  Physical Activity: Unknown (12/18/2022)   Exercise Vital Sign    Days of Exercise per Week: 0 days    Minutes of Exercise per Session: Not on file  Stress: No Stress Concern Present (12/18/2022)   [employer] of Occupational Health - Occupational Stress Questionnaire    Feeling of Stress : Not at all  Social Connections: Unknown (12/18/2022)   Social Connection and Isolation Panel [NHANES]    Frequency of Communication with Friends and Family: Not on file    Frequency of Social Gatherings with Friends and Family: More than three times a week    Attends Religious Services: Not on  Marketing executive or Organizations: Not on file    Attends Banker Meetings: Not on file    Marital Status: Married     Family History:  The patient's family history includes Arthritis in her mother; Breast cancer in her sister; Cancer in her paternal grandmother; Colon polyps in her mother and sister; Diabetes in her mother; Heart  disease in her mother; Hypercholesterolemia in her sister; Hyperlipidemia in her mother. There is no history of Colon cancer, Esophageal cancer, Rectal cancer, Stomach cancer, or Pancreatic cancer.  ROS:   12-point review of systems is negative unless otherwise noted in the HPI.   EKGs/Labs/Other Studies Reviewed:    Studies reviewed were summarized above. The additional studies were reviewed today:  R/LHC 10/04/2023: 1.  Mild nonobstructive coronary artery disease. 2.  Moderate to severe aortic stenosis with mean gradient of 32 mmHg and valve area of 1.1 cm. 3.  Right heart catheterization showed normal RA pressure, mildly elevated wedge pressure, minimal pulmonary hypertension and normal cardiac output.   Recommendations: Proceed with TAVR evaluation. __________  2D echo 09/20/2023: 1. Left ventricular ejection fraction, by estimation, is 60 to 65%. The  left ventricle has normal function. The left ventricle has no regional  wall motion abnormalities. There is mild left ventricular hypertrophy.  Left ventricular diastolic parameters  are consistent with Grade I diastolic dysfunction (impaired relaxation).   2. Right ventricular systolic function is normal. The right ventricular  size is normal.   3. The mitral valve is degenerative. Mild mitral valve regurgitation.   4. AV mean gradient , PG , DVI 0.24, AVA 0.7cm2. The aortic  valve is calcified. Aortic valve regurgitation is mild. Severe aortic  valve stenosis.   5. The inferior vena cava is dilated in size with >50% respiratory  variability, suggesting right  atrial pressure of 8 mmHg.  __________   2D echo 11/11/2022: 1. Left ventricular ejection fraction, by estimation, is 65 to 70%. The  left ventricle has normal function. The left ventricle has no regional  wall motion abnormalities. There is mild left ventricular hypertrophy.  Left ventricular diastolic parameters  are consistent with Grade I diastolic dysfunction (impaired relaxation).   2. Right ventricular systolic function is normal. The right ventricular  size is normal.   3. The mitral valve is degenerative. No evidence of mitral valve  regurgitation.   4. Mean aortic valve gradient , peak gradient , Vmax 3.29m/s,  DVI 0.37.Marland Kitchen The aortic valve is calcified. Aortic valve regurgitation is  mild. Moderate aortic valve stenosis. Aortic valve area, by VTI measures  1.53 cm.   5. The inferior vena cava is dilated in size with >50% respiratory  variability, suggesting right atrial pressure of 8 mmHg.  ___________   2D echo 12/02/2021: 1. Left ventricular ejection fraction, by estimation, is 65 to 70%. The  left ventricle has normal function. The left ventricle has no regional  wall motion abnormalities. There is mild left ventricular hypertrophy.  Left ventricular diastolic parameters  were normal.   2. Right ventricular systolic function is normal. The right ventricular  size is normal.   3. The mitral valve is degenerative. No evidence of mitral valve  regurgitation.   4. The aortic valve is calcified. Aortic valve regurgitation is mild to  moderate. Moderate aortic valve stenosis. Aortic valve mean gradient  measures 29.2 mmHg. Aortic valve Vmax measures 3.35 m/s.   5. The inferior vena cava is normal in size with greater than 50%  respiratory variability, suggesting right atrial pressure of 3 mmHg __________   Carotid artery ultrasound 07/04/2021: Summary:  Right Carotid: Velocities in the right ICA are consistent with a 1-39%  stenosis. Non-hemodynamically  significant plaque <50% noted in the  CCA. The ECA appears <50% stenosed.   Left Carotid: Velocities in the left ICA are consistent with a 1-39%  stenosis. Non-hemodynamically significant plaque <  50% noted in the CCA. The ECA appears <50% stenosed.   Vertebrals:  Bilateral vertebral arteries demonstrate antegrade flow.  Subclavians: Normal flow hemodynamics were seen in bilateral subclavian arteries. __________   LHC 11/11/2020: 1.  Mild nonobstructive coronary artery disease.  Tortuous vessels overall suggestive of chronic hypertension. 2.  Left ventricular angiography was not performed.  EF was normal by echo. 3.  Right heart catheterization showed normal filling pressures, normal pulmonary pressure and normal cardiac output. 4.  Moderate aortic stenosis with mean gradient of 24 mmHg and valve area of 1.44 cm.   Recommendations: No culprit is identified for the patient's chest pain.  Recommend medical therapy for nonobstructive coronary artery disease. Continue to monitor aortic stenosis with echocardiograms. The patient can be discharged home from a cardiac standpoint.  No other issues. __________   2D echo 12/2/201.  1. Left ventricular ejection fraction, by estimation, is 60 to 65%. The  left ventricle has normal function. The left ventricle has no regional  wall motion abnormalities. There is mild to moderate left ventricular  hypertrophy. Left ventricular diastolic  parameters are consistent with Grade I diastolic dysfunction (impaired  relaxation). The average left ventricular global longitudinal strain is  -15.3 %. The global longitudinal strain is abnormal.   2. Right ventricular systolic function is normal. The right ventricular  size is normal.   3. Left atrial size was mildly dilated.   4. The aortic valve is moderately calcified. Aortic valve regurgitation  is mild. Moderate aortic valve stenosis. Aortic valve area, by VTI  measures 1.29 cm. Aortic valve mean  gradient measures 22.2 mmHg. Aortic  valve Vmax measures 3.34 m/s. __________   2D echo 08/23/2019: 1. The left ventricle has normal systolic function with an ejection  fraction of 60-65%. The cavity size was normal. There is mild concentric  left ventricular hypertrophy. Left ventricular diastolic Doppler  parameters are consistent with impaired  relaxation.   2. The right ventricle has normal systolic function. The cavity was  normal. There is No increase in right ventricular wall thickness.   3. Left atrial size was normal.   4. Right atrial size was normal.   5. The mitral valve is abnormal. mild calcification of the mitral valve  leaflet.   6. The aortic valve is abnormal. Moderate sclerosis of the aortic valve.  Aortic valve regurgitation is mild by color flow Doppler. Mild-moderate  stenosis of the aortic valve. DVI 0.36   7. The aorta is normal unless otherwise noted.   8. The pulmonic valve was not well visualized. Pulmonic valve  regurgitation was not assessed by color flow Doppler. __________   Coronary CTA 10/05/2018: FINDINGS: Non-cardiac: See separate report from [facility] Radiology. No significant findings on limited lung and soft tissue windows.   Calcium score: Dense calcium noted in LAD and RCA   Coronary Arteries: Right dominant with no anomalies   LM: Distal LM less than 50% calcified stenosis   LAD: Ostial and mid LAD less than 50% calcific stenosis   D1: Small vessel less than 30% mixed plaque   D2: Large branching vessel 50% calcific plaque proximally   Circumflex: Less than 50% calcified plaque at ostium   OM1: Small branch without significant disease   RCA: 50% or less calcific plaque in proximal mid and distal vessel   PDA: Less than 30% calcific plaque   PLA: Less than 30% calcific plaque   IMPRESSION: 1. Calcific aortic atherosclerosis moderate Normal aortic root 2.9 cm 2.  Calcified  Aortic Valve suggest TTE correlation for AS 3.   Calcium score 521 which is 94 th percentile for age and sex 65. Moderate non obstructive appearing disease primarily in RCA and LAD as well as D2 Study will be sent for FFR CT __________   2D echo 08/17/2018: - Left ventricle: The cavity size was at the upper limits of    normal. Wall thickness was increased in a pattern of mild LVH.    Systolic function was normal. The estimated ejection fraction was    in the range of 60% to 65%. Wall motion was normal; there were no    regional wall motion abnormalities. Doppler parameters are    consistent with abnormal left ventricular relaxation (grade 1    diastolic dysfunction).  - Aortic valve: Transvalvular velocity was increased. There was    mild to moderate stenosis. There was mild to moderate    regurgitation. Mean gradient (S): 19 mm Hg. Valve area (VTI):    1.28 cm^2.  - Mitral valve: Calcified annulus.  - Left atrium: The atrium was mildly dilated.  - Right ventricle: The cavity size was normal. Systolic function    was normal. ___________   2D echo 08/05/2016: - Left ventricle: The cavity size was normal. There was mild focal    basal hypertrophy of the septum. Systolic function was normal.    The estimated ejection fraction was in the range of 60% to 65%.    Wall motion was normal; there were no regional wall motion    abnormalities. Left ventricular diastolic function parameters    were normal for the patient&'s age.  - Aortic valve: Calcified annulus. Probably trileaflet. There was    mild stenosis, based on peak velocity and mean gradient. There    was moderate regurgitation. __________   2D echo 03/17/2012: - Left ventricle: The cavity size was normal. There was mild    to moderate concentric hypertrophy. Systolic function was    normal. The estimated ejection fraction was in the range    of 55% to 60%. Wall motion was normal; there were no    regional wall motion abnormalities. Doppler parameters are    consistent with  abnormal left ventricular relaxation    (grade 1 diastolic dysfunction).  - Aortic valve: Mild regurgitation.  - Mitral valve: Mild regurgitation.  - Right ventricle: Systolic function was normal.  - Pulmonary arteries: Systolic pressure was within the    normal range.   EKG:  EKG is ordered today.  The EKG ordered today demonstrates ***  Recent Labs: 01/28/2023: ALT 20; TSH 1.33 09/22/2023: BUN 8; Creatinine, Ser 0.82; Platelets 153 10/04/2023: Hemoglobin 10.5; Potassium 3.6; Sodium 138  Recent Lipid Panel    Component Value Date/Time   CHOL 138 01/28/2023 1158   CHOL 201 (H) 09/18/2013 1016   TRIG 94.0 01/28/2023 1158   HDL 62.40 01/28/2023 1158   HDL 53 09/18/2013 1016   CHOLHDL 2 01/28/2023 1158   VLDL 18.8 01/28/2023 1158   LDLCALC 57 01/28/2023 1158   LDLCALC 130 (H) 09/18/2013 1016   LDLDIRECT 209.2 05/28/2009 0909    PHYSICAL EXAM:    VS:  There were no vitals taken for this visit.  BMI: There is no height or weight on file to calculate BMI.  Physical Exam  Wt Readings from Last 3 Encounters:  10/04/23 231 lb 1.6 oz (104.8 kg)  09/22/23 232 lb 6 oz (105.4 kg)  08/26/23 231 lb 2 oz (104.8 kg)  ASSESSMENT & PLAN:   Nonobstructive CAD:  Moderate to severe aortic stenosis: Echo from 09/2023 showed progression of aortic stenosis to severe range with a mean gradient of 36 mmHg and a valve area of 0.7 cm (previously 30 mmHg and 1.53 cm).  Subsequent R/LHC on 10/04/2023 showed minimal nonobstructive CAD as outlined above as well as moderate to severe aortic stenosis with a mean gradient of 32 mmHg and a valve area of 1.1 cm and recommendation to proceed with TAVR evaluation.  HLD: LDL 57 in 01/2023.  Bilateral carotid artery bruit: Carotid artery ultrasound in 06/2021 showed less than 40% bilateral ICA stenosis.  ***  Prior tobacco use:   {Are you ordering a CV Procedure (e.g. stress test, cath, DCCV, TEE, etc)?   Press F2        :[id number]}      Disposition: F/u with Dr. Kirke Corin or an APP in ***.   Medication Adjustments/Labs and Tests Ordered: Current medicines are reviewed at length with the patient today.  Concerns regarding medicines are outlined above. Medication changes, Labs and Tests ordered today are summarized above and listed in the Patient Instructions accessible in Encounters.   Signed, Eula Listen, PA-C 10/06/2023 7:43 AM     [facility] [place] [address] [place], [place] [address] [phone]

## 2023-10-08 NOTE — Progress Notes (Unsigned)
Cardiology Office Note    Date:  10/14/2023   ID:  JAICE LAGUE, DOB September 14, 1951, MRN [id number]  PCP:  Hannah Beat, MD  Cardiologist:  Lorine Bears, MD  Electrophysiologist:  None   Chief Complaint: Follow-up  History of Present Illness:   DIVINE IMBER is a 72 y.o. female with history of nonobstructive CAD by LHC most recently in 09/2023, moderate to severe aortic valve stenosis, diet-controlled diabetes, COPD, HTN, HLD, ongoing tobacco use, mild positional OSA, and mild cognitive impairment followed by neurology who presents for follow-up of R/LHC.   Coronary CTA in 08/2018 showed a calcium score of 521 with moderate nonobstructive disease involving the LAD and RCA that were not significant by ctFFR.  Echo in 08/2019 demonstrated an EF of 60 to 65% with moderate aortic stenosis with a mean gradient of 19 mmHg and a valve area of 1.28 cm.  Repeat echo in 11/2020 showed an EF of 60 to 65%, grade 1 diastolic dysfunction, and moderate aortic stenosis with a mean gradient of 22 mmHg and a valve area of 1.29 cm.  She was admitted to the hospital in 11/2020 with chest pain and worsening exertional dyspnea.  R/LHC showed mild nonobstructive CAD with RHC showing normal filling pressures, normal pulmonary pressure, and normal cardiac output.  Aortic stenosis was moderate with a mean gradient of 24 mmHg and a valve area of 1.44 cm.  Carotid artery ultrasound in 06/2021 showed 1 to 39% bilateral ICA stenoses with antegrade flow of the bilateral vertebral arteries and normal flow hemodynamics of the bilateral subclavian arteries.  She fell in 07/2021 leading to 2 broken ribs.  She was admitted in 11/2021 with right hand cellulitis/abscess due to an infected cat bite.  Echo during that admission demonstrated an EF of 65 to 70%, no regional wall motion abnormalities, mild LVH, normal LV diastolic function parameters, normal RV systolic function and ventricular cavity size, mild to moderate aortic  valve insufficiency, and moderate aortic valve stenosis with a mean gradient of 29.2 mmHg.  She was seen in 07/2022 and forgot to mention at her prior visit that she had 2 episodes of chest discomfort that improved with husband's SL NTG.  She also noted some lower extremity heaviness with ambulation.  Lexiscan MPI on 07/16/2022 showed no evidence of ischemia or infarction with normal LVEF and was overall low risk.  ABI normal bilaterally in 08/2022.  Echo in 11/2022 showed an EF of 65 to 70%, no regional wall motion abnormalities, mild LVH, grade 1 diastolic dysfunction mild aortic insufficiency, moderate aortic stenosis with a mean gradient of 30 mmHg and a valve area of 1.53 cm, and an estimated right atrial pressure of 8 mmHg.   She underwent MRI of the brain in 01/2023 for evaluation of memory loss, ataxia falls, and gait disturbance.  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  She subsequently followed up with ENT and reported waxing and waning swelling in this area.  Follow-up ultrasound showed a 1.1 cm simple appearing cyst within the right parotid gland.   She was seen in the office in 08/2023 and reported prior dizziness and lightheadedness improved with gait stability physical therapy.  To follow-up on her aortic valve stenosis/insufficiency she underwent echo on  09/20/2023, that showed an EF of 60 to 65%, no regional wall motion abnormalities, mild LVH, grade 1 diastolic dysfunction, normal RV systolic function and ventricular cavity size, mild mitral regurgitation, severe aortic stenosis with a mean gradient of 36 mmHg and a valve area of 0.7 cm, mild aortic insufficiency, and an estimated right atrial pressure of 8 mmHg.  When compared to echo from  11/2022, her aortic stenosis has progressed with a prior mean gradient of 30 mmHg and a valve area of 1.53 cm.  She was seen in follow-up on 09/22/2023 and reported some increased shortness of breath with exertion that improved with rest.  Subsequent R/LHC on 10/04/2023 showed mild nonobstructive disease, moderate to severe aortic stenosis with a mean gradient of 32 mmHg and a valve area 1.1 cm, normal right atrial pressure, mildly elevated wedge pressure, minimal pulmonary hypertension, and normal cardiac output.  TAVR evaluation was recommended.  She comes in accompanied by her son today and is doing well from a cardiac perspective.  Exertional dyspnea is largely unchanged and present with activity such as sweeping, mopping, or walking to the mailbox.  No presyncope or syncope.  No lower extremity swelling or progressive orthopnea.  Continues to abstain from tobacco use.  Has been has recently been placed in hospice home.  No right radial arteriotomy site complications.   Labs independently reviewed: 09/2023 - BUN 8, serum creatinine 0.82, potassium 4.3, Hgb 12.1, PLT 153 01/2023 - albumin 4.2, AST/ALT normal, A1c 6.4, TSH normal, TC 138, TG 94, HDL 62, LDL 57  Past Medical History:  Diagnosis Date   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 2013: nl LV sys fxn, mild MR, no evidence of pulm htn; b. TTE 8/17: EF 60-65%, no RWMA, nl LV dia fxn,  mild AS, mod AI   CAD (coronary artery disease), native coronary artery 11/26/2020   Controlled type 2 diabetes mellitus with diabetic autonomic neuropathy, without long-term current use of insulin (HCC) 08/10/2007   Qualifier: Diagnosis of  By: Hetty Ely MD, Franne Grip    COPD (chronic obstructive pulmonary disease) (HCC) 10/30/2016   Depression    Fracture in accidental fall    GAD (generalized anxiety disorder)    GERD (gastroesophageal reflux disease)    Hiatal hernia with gastroesophageal reflux 1997   Hyperlipidemia     Hypertension    Osteoporosis    Tobacco abuse     Past Surgical History:  Procedure Laterality Date   ABDOMINAL HYSTERECTOMY  1993   ANTERIOR CERVICAL DECOMP/DISCECTOMY FUSION N/A 10/26/2018   Procedure: ACDF C4-C5 C5-C6 C6-C7;  Surgeon: Donalee Citrin, MD;  Location: [facility] OR;  Service: Neurosurgery;  Laterality: N/A;   APPENDECTOMY     BACK SURGERY     BREAST BIOPSY Right 2007   benign   CHOLECYSTECTOMY     COLONOSCOPY     30 years ago was normal per pt.    DILATION AND CURETTAGE OF UTERUS     ESOPHAGEAL DILATION     x 3   ESOPHAGOGASTRODUODENOSCOPY (EGD) WITH PROPOFOL N/A 02/06/2022   Procedure: ESOPHAGOGASTRODUODENOSCOPY (EGD) WITH PROPOFOL;  Surgeon: Wyline Mood, MD;  Location: [facility] ENDOSCOPY;  Service: Gastroenterology;  Laterality: N/A;   ESOPHAGOGASTRODUODENOSCOPY (EGD) WITH PROPOFOL N/A 02/26/2022   Procedure: ESOPHAGOGASTRODUODENOSCOPY (EGD) WITH PROPOFOL;  Surgeon: Wyline Mood, MD;  Location: [facility] ENDOSCOPY;  Service: Gastroenterology;  Laterality: N/A;  EGD+dilation per Dr. Tobi Bastos   fractured leg Right    INCISION AND  DRAINAGE Right 12/02/2021   Procedure: INCISION AND DRAINAGE;  Surgeon: Kennedy Bucker, MD;  Location: [facility] ORS;  Service: Orthopedics;  Laterality: Right;   LEFT HEART CATH AND CORONARY ANGIOGRAPHY N/A 11/11/2020   Procedure: LEFT HEART CATH AND CORONARY ANGIOGRAPHY;  Surgeon: Iran Ouch, MD;  Location: [facility] INVASIVE CV LAB;  Service: Cardiovascular;  Laterality: N/A;   OOPHORECTOMY     OVARIAN CYST REMOVAL  1972   RIGHT/LEFT HEART CATH AND CORONARY ANGIOGRAPHY Bilateral 10/04/2023   Procedure: RIGHT/LEFT HEART CATH AND CORONARY ANGIOGRAPHY;  Surgeon: Iran Ouch, MD;  Location: [facility] INVASIVE CV LAB;  Service: Cardiovascular;  Laterality: Bilateral;   SALIVARY GLAND SURGERY     TONSILLECTOMY     4 yoa   TOTAL KNEE ARTHROPLASTY Left 12/24/2015   Procedure: LEFT TOTAL KNEE ARTHROPLASTY;  Surgeon: Cammy Copa, MD;  Location: [facility] OR;  Service:  Orthopedics;  Laterality: Left;   TOTAL KNEE ARTHROPLASTY Right 12/30/2017   Procedure: RIGHT TOTAL KNEE ARTHROPLASTY;  Surgeon: Cammy Copa, MD;  Location: [facility] OR;  Service: Orthopedics;  Laterality: Right;   WRIST FRACTURE SURGERY  11/2008    Current Medications: Current Meds  Medication Sig   acetaminophen (TYLENOL) 650 MG CR tablet Take 650 mg by mouth 3 times/day as needed-between meals & bedtime (for headaches).   albuterol (VENTOLIN HFA) 108 (90 Base) MCG/ACT inhaler Inhale 2 puffs into the lungs every 4 (four) hours as needed for wheezing or shortness of breath.   amitriptyline (ELAVIL) 50 MG tablet Take 0.5 tablets (25 mg total) by mouth at bedtime.   Ascorbic Acid (VITAMIN C) 1000 MG tablet Take 1,000 mg by mouth daily.   aspirin EC 81 MG tablet Take 81 mg by mouth daily.   b complex vitamins capsule Take 1 capsule by mouth daily.   buPROPion (WELLBUTRIN XL) 150 MG 24 hr tablet TAKE 1 TABLET EVERY DAY   calcium carbonate (TUMS - DOSED IN MG ELEMENTAL CALCIUM) 500 MG chewable tablet Chew 2 tablets by mouth daily as needed for indigestion or heartburn.   Cholecalciferol (VITAMIN D) 1000 UNITS capsule Take 1,000 Units by mouth daily.    clonazePAM (KLONOPIN) 0.5 MG tablet Take 1 tablet (0.5 mg total) by mouth 2 (two) times daily as needed.   cyclobenzaprine (FLEXERIL) 10 MG tablet Take 1 tablet by mouth three times daily as needed for muscle spasm   dimenhyDRINATE (DRAMAMINE) 50 MG tablet Take 50 mg by mouth every 8 (eight) hours as needed.   donepezil (ARICEPT) 5 MG tablet TAKE 1 TABLET BY MOUTH AT BEDTIME   ezetimibe (ZETIA) 10 MG tablet Take 1 tablet (10 mg total) by mouth daily.   FLUoxetine (PROZAC) 40 MG capsule TAKE 2 CAPSULES EVERY DAY   fluticasone (FLONASE) 50 MCG/ACT nasal spray Place 2 sprays into both nostrils daily.   gabapentin (NEURONTIN) 100 MG capsule Take 1 capsule (100 mg total) by mouth 2 (two) times daily.   metoprolol tartrate (LOPRESSOR) 25 MG tablet  TAKE 1/2 TABLET TWICE DAILY   Multiple Vitamin (MULTIVITAMIN) tablet Take 1 tablet by mouth daily.   naproxen sodium (ALEVE) 220 MG tablet Take 220 mg by mouth daily.   ondansetron (ZOFRAN-ODT) 4 MG disintegrating tablet Take 1 tablet (4 mg total) by mouth every 8 (eight) hours as needed for nausea or vomiting.   pantoprazole (PROTONIX) 40 MG tablet TAKE 1 TABLET 30 MINUTES PRIOR TO BREAKFAST   rosuvastatin (CRESTOR) 40 MG tablet Take 1 tablet (40 mg total) by mouth daily.  Simethicone 125 MG CAPS Take 125 mg by mouth daily as needed (gas).   vitamin E 180 MG (400 UNITS) capsule Take 400 Units by mouth daily.    Allergies:   Honey bee venom [bee venom] and Diazepam   Social History   Socioeconomic History   Marital status: Married    Spouse name: Not on file   Number of children: 2   Years of education: Not on file   Highest education level: Not on file  Occupational History   Occupation: [employer]    Employer: [employer]  Tobacco Use   Smoking status: Former    Average packs/day: 0.8 packs/day for 45.0 years (33.8 ttl pk-yrs)    Types: Cigarettes    Start date: 01/2023   Smokeless tobacco: Never  Vaping Use   Vaping status: Never Used  Substance and Sexual Activity   Alcohol use: Yes    Alcohol/week: 0.0 standard drinks of alcohol    Comment: glass of wine twice a month    Drug use: No   Sexual activity: Not Currently  Other Topics Concern   Not on file  Social History Narrative   Not on file   Social Determinants of Health   Financial Resource Strain: Low Risk  (12/18/2022)   Overall Financial Resource Strain (CARDIA)    Difficulty of Paying Living Expenses: Not hard at all  Food Insecurity: No Food Insecurity (12/18/2022)   Hunger Vital Sign    Worried About Running Out of Food in the Last Year: Never true    Ran Out of Food in the Last Year: Never true  Transportation Needs: No Transportation Needs (12/18/2022)   PRAPARE -  Administrator, Civil Service (Medical): No    Lack of Transportation (Non-Medical): No  Physical Activity: Unknown (12/18/2022)   Exercise Vital Sign    Days of Exercise per Week: 0 days    Minutes of Exercise per Session: Not on file  Stress: No Stress Concern Present (12/18/2022)   [employer] of Occupational Health - Occupational Stress Questionnaire    Feeling of Stress : Not at all  Social Connections: Unknown (12/18/2022)   Social Connection and Isolation Panel [NHANES]    Frequency of Communication with Friends and Family: Not on file    Frequency of Social Gatherings with Friends and Family: More than three times a week    Attends Religious Services: Not on Marketing executive or Organizations: Not on file    Attends Banker Meetings: Not on file    Marital Status: Married     Family History:  The patient's family history includes Arthritis in her mother; Breast cancer in her sister; Cancer in her paternal grandmother; Colon polyps in her mother and sister; Diabetes in her mother; Heart disease in her mother; Hypercholesterolemia in her sister; Hyperlipidemia in her mother. There is no history of Colon cancer, Esophageal cancer, Rectal cancer, Stomach cancer, or Pancreatic cancer.  ROS:   12-point review of systems is negative unless otherwise noted in the HPI.   EKGs/Labs/Other Studies Reviewed:    Studies reviewed were summarized above. The additional studies were reviewed today:  R/LHC 10/04/2023: 1.  Mild nonobstructive coronary artery disease. 2.  Moderate to severe aortic stenosis with mean gradient of 32 mmHg and valve area of 1.1 cm. 3.  Right heart catheterization showed normal RA pressure, mildly elevated wedge pressure, minimal pulmonary hypertension and normal cardiac output.  Recommendations: Proceed with TAVR evaluation. __________  2D echo 09/20/2023: 1. Left ventricular ejection fraction, by estimation, is 60 to  65%. The  left ventricle has normal function. The left ventricle has no regional  wall motion abnormalities. There is mild left ventricular hypertrophy.  Left ventricular diastolic parameters  are consistent with Grade I diastolic dysfunction (impaired relaxation).   2. Right ventricular systolic function is normal. The right ventricular  size is normal.   3. The mitral valve is degenerative. Mild mitral valve regurgitation.   4. AV mean gradient , PG , DVI 0.24, AVA 0.7cm2. The aortic  valve is calcified. Aortic valve regurgitation is mild. Severe aortic  valve stenosis.   5. The inferior vena cava is dilated in size with >50% respiratory  variability, suggesting right atrial pressure of 8 mmHg.  __________   2D echo 11/11/2022: 1. Left ventricular ejection fraction, by estimation, is 65 to 70%. The  left ventricle has normal function. The left ventricle has no regional  wall motion abnormalities. There is mild left ventricular hypertrophy.  Left ventricular diastolic parameters  are consistent with Grade I diastolic dysfunction (impaired relaxation).   2. Right ventricular systolic function is normal. The right ventricular  size is normal.   3. The mitral valve is degenerative. No evidence of mitral valve  regurgitation.   4. Mean aortic valve gradient , peak gradient , Vmax 3.63m/s,  DVI 0.37.Marland Kitchen The aortic valve is calcified. Aortic valve regurgitation is  mild. Moderate aortic valve stenosis. Aortic valve area, by VTI measures  1.53 cm.   5. The inferior vena cava is dilated in size with >50% respiratory  variability, suggesting right atrial pressure of 8 mmHg.  ___________   2D echo 12/02/2021: 1. Left ventricular ejection fraction, by estimation, is 65 to 70%. The  left ventricle has normal function. The left ventricle has no regional  wall motion abnormalities. There is mild left ventricular hypertrophy.  Left ventricular diastolic parameters  were  normal.   2. Right ventricular systolic function is normal. The right ventricular  size is normal.   3. The mitral valve is degenerative. No evidence of mitral valve  regurgitation.   4. The aortic valve is calcified. Aortic valve regurgitation is mild to  moderate. Moderate aortic valve stenosis. Aortic valve mean gradient  measures 29.2 mmHg. Aortic valve Vmax measures 3.35 m/s.   5. The inferior vena cava is normal in size with greater than 50%  respiratory variability, suggesting right atrial pressure of 3 mmHg __________   Carotid artery ultrasound 07/04/2021: Summary:  Right Carotid: Velocities in the right ICA are consistent with a 1-39%  stenosis. Non-hemodynamically significant plaque <50% noted in the  CCA. The ECA appears <50% stenosed.   Left Carotid: Velocities in the left ICA are consistent with a 1-39%  stenosis. Non-hemodynamically significant plaque <50% noted in the CCA. The ECA appears <50% stenosed.   Vertebrals:  Bilateral vertebral arteries demonstrate antegrade flow.  Subclavians: Normal flow hemodynamics were seen in bilateral subclavian arteries. __________   LHC 11/11/2020: 1.  Mild nonobstructive coronary artery disease.  Tortuous vessels overall suggestive of chronic hypertension. 2.  Left ventricular angiography was not performed.  EF was normal by echo. 3.  Right heart catheterization showed normal filling pressures, normal pulmonary pressure and normal cardiac output. 4.  Moderate aortic stenosis with mean gradient of 24 mmHg and valve area of 1.44 cm.   Recommendations: No culprit is identified for the patient's chest pain.  Recommend medical therapy  for nonobstructive coronary artery disease. Continue to monitor aortic stenosis with echocardiograms. The patient can be discharged home from a cardiac standpoint.  No other issues. __________   2D echo 12/2/201.  1. Left ventricular ejection fraction, by estimation, is 60 to 65%. The  left ventricle  has normal function. The left ventricle has no regional  wall motion abnormalities. There is mild to moderate left ventricular  hypertrophy. Left ventricular diastolic  parameters are consistent with Grade I diastolic dysfunction (impaired  relaxation). The average left ventricular global longitudinal strain is  -15.3 %. The global longitudinal strain is abnormal.   2. Right ventricular systolic function is normal. The right ventricular  size is normal.   3. Left atrial size was mildly dilated.   4. The aortic valve is moderately calcified. Aortic valve regurgitation  is mild. Moderate aortic valve stenosis. Aortic valve area, by VTI  measures 1.29 cm. Aortic valve mean gradient measures 22.2 mmHg. Aortic  valve Vmax measures 3.34 m/s. __________   2D echo 08/23/2019: 1. The left ventricle has normal systolic function with an ejection  fraction of 60-65%. The cavity size was normal. There is mild concentric  left ventricular hypertrophy. Left ventricular diastolic Doppler  parameters are consistent with impaired  relaxation.   2. The right ventricle has normal systolic function. The cavity was  normal. There is No increase in right ventricular wall thickness.   3. Left atrial size was normal.   4. Right atrial size was normal.   5. The mitral valve is abnormal. mild calcification of the mitral valve  leaflet.   6. The aortic valve is abnormal. Moderate sclerosis of the aortic valve.  Aortic valve regurgitation is mild by color flow Doppler. Mild-moderate  stenosis of the aortic valve. DVI 0.36   7. The aorta is normal unless otherwise noted.   8. The pulmonic valve was not well visualized. Pulmonic valve  regurgitation was not assessed by color flow Doppler. __________   Coronary CTA 10/05/2018: FINDINGS: Non-cardiac: See separate report from [facility]. No significant findings on limited lung and soft tissue windows.   Calcium score: Dense calcium noted in LAD and  RCA   Coronary Arteries: Right dominant with no anomalies   LM: Distal LM less than 50% calcified stenosis   LAD: Ostial and mid LAD less than 50% calcific stenosis   D1: Small vessel less than 30% mixed plaque   D2: Large branching vessel 50% calcific plaque proximally   Circumflex: Less than 50% calcified plaque at ostium   OM1: Small branch without significant disease   RCA: 50% or less calcific plaque in proximal mid and distal vessel   PDA: Less than 30% calcific plaque   PLA: Less than 30% calcific plaque   IMPRESSION: 1. Calcific aortic atherosclerosis moderate Normal aortic root 2.9 cm 2.  Calcified Aortic Valve suggest TTE correlation for AS 3.  Calcium score 521 which is 94 th percentile for age and sex 43. Moderate non obstructive appearing disease primarily in RCA and LAD as well as D2 Study will be sent for FFR CT __________   2D echo 08/17/2018: - Left ventricle: The cavity size was at the upper limits of    normal. Wall thickness was increased in a pattern of mild LVH.    Systolic function was normal. The estimated ejection fraction was    in the range of 60% to 65%. Wall motion was normal; there were no    regional wall motion abnormalities. Doppler parameters  are    consistent with abnormal left ventricular relaxation (grade 1    diastolic dysfunction).  - Aortic valve: Transvalvular velocity was increased. There was    mild to moderate stenosis. There was mild to moderate    regurgitation. Mean gradient (S): 19 mm Hg. Valve area (VTI):    1.28 cm^2.  - Mitral valve: Calcified annulus.  - Left atrium: The atrium was mildly dilated.  - Right ventricle: The cavity size was normal. Systolic function    was normal. ___________   2D echo 08/05/2016: - Left ventricle: The cavity size was normal. There was mild focal    basal hypertrophy of the septum. Systolic function was normal.    The estimated ejection fraction was in the range of 60% to 65%.    Wall  motion was normal; there were no regional wall motion    abnormalities. Left ventricular diastolic function parameters    were normal for the patient&'s age.  - Aortic valve: Calcified annulus. Probably trileaflet. There was    mild stenosis, based on peak velocity and mean gradient. There    was moderate regurgitation. __________   2D echo 03/17/2012: - Left ventricle: The cavity size was normal. There was mild    to moderate concentric hypertrophy. Systolic function was    normal. The estimated ejection fraction was in the range    of 55% to 60%. Wall motion was normal; there were no    regional wall motion abnormalities. Doppler parameters are    consistent with abnormal left ventricular relaxation    (grade 1 diastolic dysfunction).  - Aortic valve: Mild regurgitation.  - Mitral valve: Mild regurgitation.  - Right ventricle: Systolic function was normal.  - Pulmonary arteries: Systolic pressure was within the    normal range.   EKG:  EKG is ordered today.  The EKG ordered today demonstrates NSR, 73, RBBB, consistent with prior tracing  Recent Labs: 01/28/2023: ALT 20; TSH 1.33 09/22/2023: BUN 8; Creatinine, Ser 0.82; Platelets 153 10/04/2023: Hemoglobin 10.5; Potassium 3.6; Sodium 138  Recent Lipid Panel    Component Value Date/Time   CHOL 138 01/28/2023 1158   CHOL 201 (H) 09/18/2013 1016   TRIG 94.0 01/28/2023 1158   HDL 62.40 01/28/2023 1158   HDL 53 09/18/2013 1016   CHOLHDL 2 01/28/2023 1158   VLDL 18.8 01/28/2023 1158   LDLCALC 57 01/28/2023 1158   LDLCALC 130 (H) 09/18/2013 1016   LDLDIRECT 209.2 05/28/2009 0909    PHYSICAL EXAM:    VS:  BP 132/68 (BP Location: Left Arm, Patient Position: Sitting, Cuff Size: Normal)   Pulse 73   Ht 5\' 7"  (1.702 m)   Wt 225 lb 9.6 oz (102.3 kg)   SpO2 98%   BMI 35.33 kg/m   BMI: Body mass index is 35.33 kg/m.  Physical Exam Vitals reviewed.  Constitutional:      Appearance: She is well-developed.  HENT:     Head:  Normocephalic and atraumatic.  Eyes:     General:        Right eye: No discharge.        Left eye: No discharge.  Neck:     Vascular: No JVD.  Cardiovascular:     Rate and Rhythm: Normal rate and regular rhythm.     Pulses:          Carotid pulses are  on the left side with bruit.    Heart sounds: S1 normal and S2 normal. Heart sounds not distant.  No midsystolic click and no opening snap. Murmur heard.     Harsh midsystolic murmur is present with a grade of 3/6 at the upper right sternal border radiating to the neck.     No friction rub.     Comments: Right radial arteriotomy site is well-healed without active bleeding, bruising, swelling, warmth, or erythema.  Radial pulse 2+ proximal and distal to the arteriotomy site. Pulmonary:     Effort: Pulmonary effort is normal. No respiratory distress.     Breath sounds: Normal breath sounds. No decreased breath sounds, wheezing, rhonchi or rales.  Chest:     Chest wall: No tenderness.  Abdominal:     General: There is no distension.  Musculoskeletal:     Cervical back: Normal range of motion.  Skin:    General: Skin is warm and dry.     Nails: There is no clubbing.  Neurological:     Mental Status: She is alert and oriented to person, place, and time.  Psychiatric:        Speech: Speech normal.        Behavior: Behavior normal.        Thought Content: Thought content normal.        Judgment: Judgment normal.     Wt Readings from Last 3 Encounters:  10/14/23 225 lb 9.6 oz (102.3 kg)  10/04/23 231 lb 1.6 oz (104.8 kg)  09/22/23 232 lb 6 oz (105.4 kg)     ASSESSMENT & PLAN:   Nonobstructive CAD: No symptoms of angina.  Exertional dyspnea largely unchanged with LHC last month showing mild nonobstructive CAD with recommendation to proceed with TAVR evaluation as outlined below.  Continue aggressive risk factor modification and primary prevention including aspirin, rosuvastatin 40 mg, and ezetimibe.  No indication for further  ischemic evaluation at this time.  Moderate to severe aortic stenosis: Echo from 09/2023 showed progression of aortic stenosis to severe range with a mean gradient of 36 mmHg and a valve area of 0.7 cm (previously 30 mmHg and 1.53 cm).  Subsequent R/LHC on 10/04/2023 showed minimal nonobstructive CAD as outlined above as well as moderate to severe aortic stenosis with a mean gradient of 32 mmHg and a valve area of 1.1 cm and recommendation to proceed with TAVR evaluation.  Refer to structural heart team.  HLD: LDL 57 in 01/2023.  She remains on rosuvastatin 40 mg and ezetimibe.  Carotid artery stenosis: Carotid artery ultrasound in 06/2021 showed less than 40% bilateral ICA stenosis.  Schedule carotid artery ultrasound.  Continue aspirin and statin as outlined above.  Prior tobacco use: Quit in 01/2023.    Disposition: F/u with Dr. Kirke Corin or an APP in 6 months.   Medication Adjustments/Labs and Tests Ordered: Current medicines are reviewed at length with the patient today.  Concerns regarding medicines are outlined above. Medication changes, Labs and Tests ordered today are summarized above and listed in the Patient Instructions accessible in Encounters.   Signed, Eula Listen, PA-C 10/14/2023 4:05 PM     [facility] [place] [address] [place], [place] [address] [phone]

## 2023-10-13 ENCOUNTER — Encounter: Payer: Self-pay | Admitting: Podiatry

## 2023-10-13 ENCOUNTER — Ambulatory Visit (INDEPENDENT_AMBULATORY_CARE_PROVIDER_SITE_OTHER): Payer: Medicare Other | Admitting: Podiatry

## 2023-10-13 DIAGNOSIS — E1142 Type 2 diabetes mellitus with diabetic polyneuropathy: Secondary | ICD-10-CM

## 2023-10-13 DIAGNOSIS — M79676 Pain in unspecified toe(s): Secondary | ICD-10-CM | POA: Diagnosis not present

## 2023-10-13 DIAGNOSIS — B351 Tinea unguium: Secondary | ICD-10-CM

## 2023-10-13 NOTE — Progress Notes (Signed)
She presents today chief complaint of painful elongated toenails.  Objective: Pulses are palpable bilateral.  Toenails are long thick yellow dystrophic like mycotic and painful palpation.  Assessment: Pain limb secondary to onychomycosis.  Plan: Debridement of toenails 1 through 5 bilateral

## 2023-10-14 ENCOUNTER — Ambulatory Visit: Payer: Medicare Other | Attending: Physician Assistant | Admitting: Physician Assistant

## 2023-10-14 ENCOUNTER — Encounter: Payer: Self-pay | Admitting: Physician Assistant

## 2023-10-14 VITALS — BP 132/68 | HR 73 | Ht 67.0 in | Wt 225.6 lb

## 2023-10-14 DIAGNOSIS — E785 Hyperlipidemia, unspecified: Secondary | ICD-10-CM | POA: Insufficient documentation

## 2023-10-14 DIAGNOSIS — I2510 Atherosclerotic heart disease of native coronary artery without angina pectoris: Secondary | ICD-10-CM | POA: Insufficient documentation

## 2023-10-14 DIAGNOSIS — I6523 Occlusion and stenosis of bilateral carotid arteries: Secondary | ICD-10-CM | POA: Insufficient documentation

## 2023-10-14 DIAGNOSIS — I350 Nonrheumatic aortic (valve) stenosis: Secondary | ICD-10-CM | POA: Insufficient documentation

## 2023-10-14 DIAGNOSIS — Z87891 Personal history of nicotine dependence: Secondary | ICD-10-CM | POA: Diagnosis not present

## 2023-10-14 NOTE — Patient Instructions (Signed)
Medication Instructions:  Your physician recommends that you continue on your current medications as directed. Please refer to the Current Medication list given to you today.  *If you need a refill on your cardiac medications before your next appointment, please call your pharmacy*  Testing/Procedures: Your physician has requested that you have a carotid duplex. This test is an ultrasound of the carotid arteries in your neck. It looks at blood flow through these arteries that supply the brain with blood. Allow one hour for this exam. There are no restrictions or special instructions.  Follow-Up: At [facility], you and your health needs are our priority.  As part of our continuing mission to provide you with exceptional heart care, we have created designated Provider Care Teams.  These Care Teams include your primary Cardiologist (physician) and Advanced Practice Providers (APPs -  Physician Assistants and Nurse Practitioners) who all work together to provide you with the care you need, when you need it.  Your next appointment:   6 months  Provider:   You may see Lorine Bears, MD or one of the following Advanced Practice Providers on your designated Care Team:   Nicolasa Ducking, NP Eula Listen, PA-C Cadence Fransico Michael, PA-C Charlsie Quest, NP Carlos Levering, NP    Other Instructions You have been referred to the Structural Heart Team. They will call you to schedule an appointment.

## 2023-10-17 DIAGNOSIS — R0781 Pleurodynia: Secondary | ICD-10-CM | POA: Diagnosis not present

## 2023-10-17 DIAGNOSIS — S2232XA Fracture of one rib, left side, initial encounter for closed fracture: Secondary | ICD-10-CM | POA: Diagnosis not present

## 2023-10-19 ENCOUNTER — Encounter: Payer: Self-pay | Admitting: Family Medicine

## 2023-10-20 ENCOUNTER — Ambulatory Visit: Payer: Medicare Other | Attending: Internal Medicine | Admitting: Internal Medicine

## 2023-10-20 ENCOUNTER — Other Ambulatory Visit: Payer: Self-pay

## 2023-10-20 ENCOUNTER — Encounter: Payer: Self-pay | Admitting: Internal Medicine

## 2023-10-20 VITALS — BP 154/72 | HR 70 | Ht 67.0 in | Wt 224.4 lb

## 2023-10-20 DIAGNOSIS — I10 Essential (primary) hypertension: Secondary | ICD-10-CM | POA: Diagnosis not present

## 2023-10-20 DIAGNOSIS — I2510 Atherosclerotic heart disease of native coronary artery without angina pectoris: Secondary | ICD-10-CM

## 2023-10-20 DIAGNOSIS — I350 Nonrheumatic aortic (valve) stenosis: Secondary | ICD-10-CM | POA: Diagnosis not present

## 2023-10-20 DIAGNOSIS — I4510 Unspecified right bundle-branch block: Secondary | ICD-10-CM | POA: Diagnosis not present

## 2023-10-20 DIAGNOSIS — E785 Hyperlipidemia, unspecified: Secondary | ICD-10-CM | POA: Diagnosis not present

## 2023-10-20 DIAGNOSIS — Z01812 Encounter for preprocedural laboratory examination: Secondary | ICD-10-CM | POA: Diagnosis not present

## 2023-10-20 NOTE — Patient Instructions (Addendum)
Medication Instructions:  No changes *If you need a refill on your cardiac medications before your next appointment, please call your pharmacy*   Lab Work: Please return for blood work:  bmet before the CT scan   If you have labs (blood work) drawn today and your tests are completely normal, you will receive your results only by: MyChart Message (if you have MyChart) OR A paper copy in the mail If you have any lab test that is abnormal or we need to change your treatment, we will call you to review the results.   Testing/Procedures: none   Follow-Up: Per Structural Heart Team

## 2023-10-20 NOTE — Progress Notes (Signed)
Patient ID: Amanda [name] MRN: [id number] DOB/AGE: 02/15/1951 72 y.o.  Primary Care Physician:Copland, Karleen Hampshire, MD Primary Cardiologist: Terrilee Files  FOCUSED CARDIOVASCULAR PROBLEM LIST:   Aortic stenosis AVA 0.7, MG 36, EF 60 to 65% Right bundle branch block Diabetes mellitus, diet controlled Hypertension Hyperlipidemia BMI 35 CKD stage II Coronary artery disease Mild, cath 2024 Mild cognitive impairment History of tobacco abuse  HISTORY OF PRESENT ILLNESS: The patient is a 72 y.o. female with the indicated medical history here for recommendations regarding her severe aortic stenosis.  She was seen recently by her cardiology and reported increasing shortness of breath with exertion.  She was referred for coronary angiography and right heart catheterization which demonstrated minimal nonobstructive coronary artery disease.  Her echocardiogram demonstrated moderate to severe aortic stenosis.  The patient is here with her son.  She has noticed increasing shortness of breath over the last year or so.  She used to be able to walk to her mailbox and back without stopping and now she cannot do that without having to stop at least once or twice.  She really had to walk slow to get into the clinic today.  She has had some chest tightness with exertion as well.  She has had some occasional lightheadedness.  She has not had any frank syncope.  She has had some mild peripheral edema.  She usually sleeps on a few pillows.  Unfortunately the patient's husband is on hospice.  She has had to take care of everything around the house since he became quite ill.  She has trouble doing this and would like to be more independent.  She is edentulous.  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 2013: nl LV sys fxn, mild MR, no evidence of pulm htn; b. TTE 8/17: EF 60-65%, no RWMA, nl LV dia fxn,  mild AS, mod AI   CAD (coronary artery disease),  native coronary artery 11/26/2020   Controlled type 2 diabetes mellitus with diabetic autonomic neuropathy, without long-term current use of insulin (HCC) 08/10/2007   Qualifier: Diagnosis of  By: Hetty Ely MD, Franne Grip    COPD (chronic obstructive pulmonary disease) (HCC) 10/30/2016   Depression    Fracture in accidental fall    GAD (generalized anxiety disorder)    GERD (gastroesophageal reflux disease)    Hiatal hernia with gastroesophageal reflux 1997   Hyperlipidemia    Hypertension    Osteoporosis    Tobacco abuse     Past Surgical History:  Procedure Laterality Date   ABDOMINAL HYSTERECTOMY  1993   ANTERIOR CERVICAL DECOMP/DISCECTOMY FUSION N/A 10/26/2018   Procedure: ACDF C4-C5 C5-C6 C6-C7;  Surgeon: Donalee Citrin, MD;  Location: [facility] OR;  Service: Neurosurgery;  Laterality: N/A;   APPENDECTOMY     BACK SURGERY     BREAST BIOPSY Right 2007   benign   CHOLECYSTECTOMY     COLONOSCOPY     30 years ago was normal per pt.    DILATION AND CURETTAGE OF UTERUS     ESOPHAGEAL DILATION     x 3   ESOPHAGOGASTRODUODENOSCOPY (EGD) WITH PROPOFOL N/A 02/06/2022   Procedure: ESOPHAGOGASTRODUODENOSCOPY (EGD) WITH PROPOFOL;  Surgeon: Wyline Mood, MD;  Location[facility] ENDOSCOPY;  Service: Gastroenterology;  Laterality: N/A;   ESOPHAGOGASTRODUODENOSCOPY (EGD) WITH PROPOFOL N/A 02/26/2022   Procedure: ESOPHAGOGASTRODUODENOSCOPY (EGD) WITH PROPOFOL;  Surgeon: Wyline Mood, MD;  Location: [facility] ENDOSCOPY;  Service:  Gastroenterology;  Laterality: N/A;  EGD+dilation per Dr. Tobi Bastos   fractured leg Right    INCISION AND DRAINAGE Right 12/02/2021   Procedure: INCISION AND DRAINAGE;  Surgeon: Kennedy Bucker, MD;  Location: [facility] ORS;  Service: Orthopedics;  Laterality: Right;   LEFT HEART CATH AND CORONARY ANGIOGRAPHY N/A 11/11/2020   Procedure: LEFT HEART CATH AND CORONARY ANGIOGRAPHY;  Surgeon: Iran Ouch, MD;  Location: [facility] INVASIVE CV LAB;  Service: Cardiovascular;  Laterality: N/A;   OOPHORECTOMY      OVARIAN CYST REMOVAL  1972   RIGHT/LEFT HEART CATH AND CORONARY ANGIOGRAPHY Bilateral 10/04/2023   Procedure: RIGHT/LEFT HEART CATH AND CORONARY ANGIOGRAPHY;  Surgeon: Iran Ouch, MD;  Location: [facility] INVASIVE CV LAB;  Service: Cardiovascular;  Laterality: Bilateral;   SALIVARY GLAND SURGERY     TONSILLECTOMY     4 yoa   TOTAL KNEE ARTHROPLASTY Left 12/24/2015   Procedure: LEFT TOTAL KNEE ARTHROPLASTY;  Surgeon: Cammy Copa, MD;  Location: [facility] OR;  Service: Orthopedics;  Laterality: Left;   TOTAL KNEE ARTHROPLASTY Right 12/30/2017   Procedure: RIGHT TOTAL KNEE ARTHROPLASTY;  Surgeon: Cammy Copa, MD;  Location: [facility] OR;  Service: Orthopedics;  Laterality: Right;   WRIST FRACTURE SURGERY  11/2008    Family History  Problem Relation Age of Onset   Hyperlipidemia Mother    Arthritis Mother    Diabetes Mother    Heart disease Mother    Colon polyps Mother    Cancer Paternal Grandmother        breast   Hypercholesterolemia Sister    Colon polyps Sister    Breast cancer Sister    Colon cancer Neg Hx    Esophageal cancer Neg Hx    Rectal cancer Neg Hx    Stomach cancer Neg Hx    Pancreatic cancer Neg Hx     Social History   Socioeconomic History   Marital status: Married    Spouse name: Not on file   Number of children: 2   Years of education: Not on file   Highest education level: Not on file  Occupational History   Occupation: [employer]    Employer: [employer]  Tobacco Use   Smoking status: Former    Average packs/day: 0.8 packs/day for 45.0 years (33.8 ttl pk-yrs)    Types: Cigarettes    Start date: 01/2023   Smokeless tobacco: Never  Vaping Use   Vaping status: Never Used  Substance and Sexual Activity   Alcohol use: Yes    Alcohol/week: 0.0 standard drinks of alcohol    Comment: glass of wine twice a month    Drug use: No   Sexual activity: Not Currently  Other Topics Concern   Not on file  Social History  Narrative   Not on file   Social Determinants of Health   Financial Resource Strain: Low Risk  (12/18/2022)   Overall Financial Resource Strain (CARDIA)    Difficulty of Paying Living Expenses: Not hard at all  Food Insecurity: No Food Insecurity (12/18/2022)   Hunger Vital Sign    Worried About Running Out of Food in the Last Year: Never true    Ran Out of Food in the Last Year: Never true  Transportation Needs: No Transportation Needs (12/18/2022)   PRAPARE - Administrator, Civil Service (Medical): No    Lack of Transportation (Non-Medical): No  Physical Activity: Unknown (12/18/2022)   Exercise Vital Sign    Days of Exercise  per Week: 0 days    Minutes of Exercise per Session: Not on file  Stress: No Stress Concern Present (12/18/2022)   [employer] of Occupational Health - Occupational Stress Questionnaire    Feeling of Stress : Not at all  Social Connections: Unknown (12/18/2022)   Social Connection and Isolation Panel [NHANES]    Frequency of Communication with Friends and Family: Not on file    Frequency of Social Gatherings with Friends and Family: More than three times a week    Attends Religious Services: Not on file    Active Member of Clubs or Organizations: Not on file    Attends Banker Meetings: Not on file    Marital Status: Married  Intimate Partner Violence: Not At Risk (12/18/2022)   Humiliation, Afraid, Rape, and Kick questionnaire    Fear of Current or Ex-Partner: No    Emotionally Abused: No    Physically Abused: No    Sexually Abused: No     Prior to Admission medications   Medication Sig Start Date End Date Taking? Authorizing Provider  acetaminophen (TYLENOL) 650 MG CR tablet Take 650 mg by mouth 3 times/day as needed-between meals & bedtime (for headaches).    [provider]  albuterol (VENTOLIN HFA) 108 (90 Base) MCG/ACT inhaler Inhale 2 puffs into the lungs every 4 (four) hours as needed for wheezing or shortness  of breath. 08/12/22   Copland, Karleen Hampshire, MD  amitriptyline (ELAVIL) 50 MG tablet Take 0.5 tablets (25 mg total) by mouth at bedtime. 09/21/23   Copland, Karleen Hampshire, MD  Ascorbic Acid (VITAMIN C) 1000 MG tablet Take 1,000 mg by mouth daily.    [provider]  aspirin EC 81 MG tablet Take 81 mg by mouth daily.    [provider]  b complex vitamins capsule Take 1 capsule by mouth daily.    [provider]  buPROPion (WELLBUTRIN XL) 150 MG 24 hr tablet TAKE 1 TABLET EVERY DAY 04/19/23   Copland, Karleen Hampshire, MD  calcium carbonate (TUMS - DOSED IN MG ELEMENTAL CALCIUM) 500 MG chewable tablet Chew 2 tablets by mouth daily as needed for indigestion or heartburn.    [provider]  Cholecalciferol (VITAMIN D) 1000 UNITS capsule Take 1,000 Units by mouth daily.     [provider]  clonazePAM (KLONOPIN) 0.5 MG tablet Take 1 tablet (0.5 mg total) by mouth 2 (two) times daily as needed. 06/17/23   Copland, Karleen Hampshire, MD  cyclobenzaprine (FLEXERIL) 10 MG tablet Take 1 tablet by mouth three times daily as needed for muscle spasm 08/27/23   Copland, Karleen Hampshire, MD  dimenhyDRINATE (DRAMAMINE) 50 MG tablet Take 50 mg by mouth every 8 (eight) hours as needed.    [provider]  donepezil (ARICEPT) 5 MG tablet TAKE 1 TABLET BY MOUTH AT BEDTIME 06/18/23   Copland, Karleen Hampshire, MD  ezetimibe (ZETIA) 10 MG tablet Take 1 tablet (10 mg total) by mouth daily. 08/26/23   Dunn, Raymon Mutton, PA-C  FLUoxetine (PROZAC) 40 MG capsule TAKE 2 CAPSULES EVERY DAY 01/27/23   Copland, Karleen Hampshire, MD  fluticasone (FLONASE) 50 MCG/ACT nasal spray Place 2 sprays into both nostrils daily. 01/15/23 01/15/24  [provider]  gabapentin (NEURONTIN) 100 MG capsule Take 1 capsule (100 mg total) by mouth 2 (two) times daily. 08/12/22   Copland, Karleen Hampshire, MD  guaiFENesin (MUCINEX) 600 MG 12 hr tablet Take 600 mg by mouth daily. Patient not taking: Reported on 10/14/2023    [provider]  metoprolol  tartrate  (LOPRESSOR) 25 MG tablet TAKE 1/2 TABLET TWICE DAILY 01/27/23   Iran Ouch, MD  Multiple Vitamin (MULTIVITAMIN) tablet Take 1 tablet by mouth daily.    [provider]  naproxen sodium (ALEVE) 220 MG tablet Take 220 mg by mouth daily.    [provider]  ondansetron (ZOFRAN-ODT) 4 MG disintegrating tablet Take 1 tablet (4 mg total) by mouth every 8 (eight) hours as needed for nausea or vomiting. 12/20/21   Menshew, Charlesetta Ivory, PA-C  pantoprazole (PROTONIX) 40 MG tablet TAKE 1 TABLET 30 MINUTES PRIOR TO BREAKFAST 06/01/23   Copland, Karleen Hampshire, MD  rosuvastatin (CRESTOR) 40 MG tablet Take 1 tablet (40 mg total) by mouth daily. 07/12/23   Copland, Karleen Hampshire, MD  Simethicone 125 MG CAPS Take 125 mg by mouth daily as needed (gas).    [provider]  vitamin E 180 MG (400 UNITS) capsule Take 400 Units by mouth daily.    [provider]    Allergies  Allergen Reactions   Honey Bee Venom [Bee Venom] Anaphylaxis   Diazepam Other (See Comments)    REACTION: pt states she gets very angry and abusive verbally on this medication     REVIEW OF SYSTEMS:  General: no fevers/chills/night sweats Eyes: no blurry vision, diplopia, or amaurosis ENT: no sore throat or hearing loss Resp: no cough, wheezing, or hemoptysis CV: no edema or palpitations GI: no abdominal pain, nausea, vomiting, diarrhea, or constipation GU: no dysuria, frequency, or hematuria Skin: no rash Neuro: no headache, numbness, tingling, or weakness of extremities Musculoskeletal: no joint pain or swelling Heme: no bleeding, DVT, or easy bruising Endo: no polydipsia or polyuria  BP (!) 154/72   Pulse 70   Ht 5\' 7"  (1.702 m)   Wt 224 lb 6.4 oz (101.8 kg)   SpO2 95%   BMI 35.15 kg/m   PHYSICAL EXAM: GEN:  AO x 3 in no acute distress HEENT: normal Dentition: Edentulous  Neck: JVP normal. +2 carotid upstrokes without bruits. No thyromegaly. Lungs: equal expansion, clear bilaterally CV:  Apex is discrete and nondisplaced, RRR with 3/6 SEM Abd: soft, non-tender, non-distended; no bruit; positive bowel sounds Ext: no edema, ecchymoses, or cyanosis Vascular: 2+ femoral pulses, 2+ radial pulses       Skin: warm and dry without rash Neuro: CN II-XII grossly intact; motor and sensory grossly intact    DATA AND STUDIES:  EKG: EKG from November 2024 demonstrates sinus rhythm with a right bundle branch block  EKG Interpretation Date/Time:    Ventricular Rate:    PR Interval:    QRS Duration:    QT Interval:    QTC Calculation:   R Axis:      Text Interpretation:          Cardiac Studies & Procedures   CARDIAC CATHETERIZATION  CARDIAC CATHETERIZATION 10/04/2023  Narrative 1.  Mild nonobstructive coronary artery disease. 2.  Moderate to severe aortic stenosis with mean gradient of 32 mmHg and valve area of 1.1 cm. 3.  Right heart catheterization showed normal RA pressure, mildly elevated wedge pressure, minimal pulmonary hypertension and normal cardiac output.  Recommendations: Proceed with TAVR evaluation.  Findings Coronary Findings Diagnostic  Dominance: Right  Left Main Vessel is angiographically normal.  Left Anterior Descending The vessel exhibits minimal luminal irregularities.  First Diagonal Branch Vessel is angiographically normal.  Second Diagonal Branch Vessel is large in size. Vessel is angiographically normal.  Left Circumflex The vessel exhibits minimal luminal irregularities.  Right Coronary Artery There is mild diffuse disease throughout the vessel.  Intervention  No interventions have been documented.   CARDIAC CATHETERIZATION  CARDIAC CATHETERIZATION 11/11/2020  Narrative 1.  Mild nonobstructive coronary artery disease.  Tortuous vessels overall suggestive of chronic hypertension. 2.  Left ventricular angiography was not performed.  EF was normal by echo. 3.  Right heart catheterization showed normal filling pressures,  normal pulmonary pressure and normal cardiac output. 4.  Moderate aortic stenosis with mean gradient of 24 mmHg and valve area of 1.44 cm.  Recommendations: No culprit is identified for the patient's chest pain.  Recommend medical therapy for nonobstructive coronary artery disease. Continue to monitor aortic stenosis with echocardiograms. The patient can be discharged home from a cardiac standpoint.  No other issues.  Findings Coronary Findings Diagnostic  Dominance: Right  Left Main Vessel is angiographically normal.  Left Anterior Descending The vessel exhibits minimal luminal irregularities.  First Diagonal Branch Vessel is angiographically normal.  Second Diagonal Branch Vessel is large in size. Vessel is angiographically normal.  Left Circumflex The vessel exhibits minimal luminal irregularities.  Right Coronary Artery There is mild diffuse disease throughout the vessel.  Intervention  No interventions have been documented.   STRESS TESTS  NM MYOCAR MULTI W/SPECT W 07/16/2022  Narrative   The study is normal. The study is low risk.   No ST deviation was noted.   LV perfusion is normal. There is no evidence of ischemia. There is no evidence of infarction.   Left ventricular function is normal. Nuclear stress EF: 67 %. The left ventricular ejection fraction is normal (55-65%). End diastolic cavity size is normal. End systolic cavity size is normal.   ECHOCARDIOGRAM  ECHOCARDIOGRAM COMPLETE 09/20/2023  Narrative ECHOCARDIOGRAM REPORT    Patient Name:   Amanda [name] Date of Exam: 09/20/2023 Medical Rec #:  [id number]        Height:       67.0 in Accession #:    [id number]       Weight:       231.1 lb Date of Birth:  1951-04-16        BSA:          2.150 m Patient Age:    72 years         BP:           106/60 mmHg Patient Gender: F                HR:           76 bpm. Exam Location:  [place]  Procedure: 2D Echo, 3D Echo, Cardiac Doppler and Strain  Analysis  Indications:    I35.0 Nonrheumatic aortic (valve) stenosis  History:        Patient has prior history of Echocardiogram examinations, most recent 11/11/2022. CAD, COPD; Risk Factors:Hypertension, Diabetes, Dyslipidemia and Former Smoker.  Sonographer:    Ilda Mori MHA, BS, RDCS Referring Phys: [id number] Raymon Mutton DUNN  IMPRESSIONS   1. Left ventricular ejection fraction, by estimation, is 60 to 65%. The left ventricle has normal function. The left ventricle has no regional wall motion abnormalities. There is mild left ventricular hypertrophy. Left ventricular diastolic parameters are consistent with Grade I diastolic dysfunction (impaired relaxation). 2. Right ventricular systolic function is normal. The right ventricular size is normal. 3. The mitral valve is degenerative. Mild mitral valve regurgitation. 4. AV mean gradient , PG , DVI 0.24, AVA 0.7cm2. The aortic valve is calcified. Aortic valve regurgitation  is mild. Severe aortic valve stenosis. 5. The inferior vena cava is dilated in size with >50% respiratory variability, suggesting right atrial pressure of 8 mmHg.  FINDINGS Left Ventricle: Left ventricular ejection fraction, by estimation, is 60 to 65%. The left ventricle has normal function. The left ventricle has no regional wall motion abnormalities. Global longitudinal strain performed but not reported based on interpreter judgement due to suboptimal tracking. The left ventricular internal cavity size was normal in size. There is mild left ventricular hypertrophy. Left ventricular diastolic parameters are consistent with Grade I diastolic dysfunction (impaired relaxation).  Right Ventricle: The right ventricular size is normal. No increase in right ventricular wall thickness. Right ventricular systolic function is normal.  Left Atrium: Left atrial size was normal in size.  Right Atrium: Right atrial size was normal in size.  Pericardium: There is no  evidence of pericardial effusion.  Mitral Valve: The mitral valve is degenerative in appearance. Mild to moderate mitral annular calcification. Mild mitral valve regurgitation.  Tricuspid Valve: The tricuspid valve is normal in structure. Tricuspid valve regurgitation is not demonstrated.  Aortic Valve: AV mean gradient , PG , DVI 0.24, AVA 0.7cm2. The aortic valve is calcified. Aortic valve regurgitation is mild. Severe aortic stenosis is present. Aortic valve mean gradient measures 33.0 mmHg. Aortic valve peak gradient measures 55.5 mmHg. Aortic valve area, by VTI measures 0.66 cm.  Pulmonic Valve: The pulmonic valve was normal in structure. Pulmonic valve regurgitation is not visualized.  Aorta: The aortic root and ascending aorta are structurally normal, with no evidence of dilitation.  Venous: The inferior vena cava is dilated in size with greater than 50% respiratory variability, suggesting right atrial pressure of 8 mmHg.  IAS/Shunts: No atrial level shunt detected by color flow Doppler.   LEFT VENTRICLE PLAX 2D LVOT diam:     1.90 cm   Diastology LV SV:         63        LV e' medial:    5.98 cm/s LV SV Index:   29        LV E/e' medial:  18.1 LVOT Area:     2.84 cm  LV e' lateral:   6.31 cm/s LV E/e' lateral: 17.1  3D Volume EF: 3D EF:        52 % LV EDV:       104 ml LV ESV:       50 ml LV SV:        54 ml  RIGHT VENTRICLE             IVC RV S prime:     15.10 cm/s  IVC diam: 2.60 cm TAPSE (M-mode): 3.1 cm  LEFT ATRIUM             Index LA Vol (A2C):   74.5 ml 34.64 ml/m LA Vol (A4C):   46.8 ml 21.76 ml/m LA Biplane Vol: 59.0 ml 27.44 ml/m AORTIC VALVE AV Area (Vmax):    0.73 cm AV Area (Vmean):   0.69 cm AV Area (VTI):     0.66 cm AV Vmax:           372.50 cm/s AV Vmean:          266.000 cm/s AV VTI:            0.955 m AV Peak Grad:      55.5 mmHg AV Mean Grad:      33.0 mmHg LVOT Vmax:  95.85 cm/s LVOT Vmean:        64.800  cm/s LVOT VTI:          0.224 m LVOT/AV VTI ratio: 0.23  AORTA Ao Root diam: 3.00 cm Ao Asc diam:  2.90 cm  MITRAL VALVE MV Area (PHT): 3.08 cm     SHUNTS MV Decel Time: 246 msec     Systemic VTI:  0.22 m MV E velocity: 108.00 cm/s  Systemic Diam: 1.90 cm MV A velocity: 140.00 cm/s MV E/A ratio:  0.77  Debbe Odea MD Electronically signed by Debbe Odea MD Signature Date/Time: 09/20/2023/3:47:46 PM    Final     CT SCANS  CT CORONARY MORPH W/CTA COR W/SCORE 09/02/2018  Addendum 10/05/2018  6:25 PM ADDENDUM REPORT: 10/05/2018 18:22  CLINICAL DATA:  Chest pain  EXAM: Cardiac CTA  MEDICATIONS: Sub lingual nitro. 4mg  and lopressor 5mg   TECHNIQUE: The patient was scanned on a [employer] 192 scanner. Gantry rotation speed was 250 msecs. Collimation was. 6 mm . A 120 kV prospective scan was triggered in the ascending thoracic aorta at 140 HU's with full mA between 30-70% of the R-R interval . Average HR during the scan was 66 bpm. The 3D data set was interpreted on a dedicated work station using MPR, MIP and VRT modes. A total of 80cc of contrast was used.  FINDINGS: Non-cardiac: See separate report from [facility]. No significant findings on limited lung and soft tissue windows.  Calcium score: Dense calcium noted in LAD and RCA  Coronary Arteries: Right dominant with no anomalies  LM: Distal LM less than 50% calcified stenosis  LAD: Ostial and mid LAD less than 50% calcific stenosis  D1: Small vessel less than 30% mixed plaque  D2: Large branching vessel 50% calcific plaque proximally  Circumflex: Less than 50% calcified plaque at ostium  OM1: Small branch without significant disease  RCA: 50% or less calcific plaque in proximal mid and distal vessel  PDA: Less than 30% calcific plaque  PLA: Less than 30% calcific plaque  IMPRESSION: 1. Calcific aortic atherosclerosis moderate Normal aortic root 2.9 cm  2.  Calcified  Aortic Valve suggest TTE correlation for AS  3.  Calcium score 521 which is 94 th percentile for age and sex  51. Moderate non obstructive appearing disease primarily in RCA and LAD as well as D2 Study will be sent for FFR CT  Charlton Haws   Electronically Signed By: Charlton Haws M.D. On: 10/05/2018 18:22  Narrative EXAM: OVER-READ INTERPRETATION  CT CHEST  The following report is an over-read performed by radiologist Dr. Charlett Nose of [facility] on 10/04/2018. This over-read does not include interpretation of cardiac or coronary anatomy or pathology. The coronary CTA interpretation by the cardiologist is attached.  COMPARISON:  03/27/2013  FINDINGS: Vascular: Heart is normal size. Visualized aorta is normal caliber. Moderate calcifications in the visualized descending thoracic aorta.  Mediastinum/Nodes: No adenopathy in the lower mediastinum or hila.  Lungs/Pleura: Posterior left diaphragmatic hernia containing fat. No confluent airspace opacities or effusions.  Upper Abdomen: Imaging into the upper abdomen shows no acute findings.  Musculoskeletal: Chest wall soft tissues are unremarkable. No acute bony abnormality.  IMPRESSION: No acute extra cardiac abnormality.  Descending aortic atherosclerosis.  Posterior left diaphragmatic hernia containing fat.  Electronically Signed: By: Charlett Nose M.D. On: 10/04/2018 11:38          01/28/2023: ALT 20; TSH 1.33 09/22/2023: BUN 8; Creatinine, Ser 0.82; Platelets 153  10/04/2023: Hemoglobin 10.5; Potassium 3.6; Sodium 138   STS RISK CALCULATOR: Pending  NHYA CLASS: 2    ASSESSMENT AND PLAN:   Severe aortic stenosis - Plan: Basic Metabolic Panel (BMET)  Coronary artery disease, non-occlusive  Essential hypertension  Hyperlipidemia LDL goal <70  RBBB  Pre-procedure lab exam - Plan: Basic Metabolic Panel (BMET)  The patient has developed moderate to severe symptomatic aortic stenosis  with NYHA class II symptoms.  I reviewed her echocardiogram which demonstrates a heavily calcified valve with restricted leaflet motion.  The patient is obviously quite limited.  She has had a coronary angiography and right heart catheterization study that I reviewed.  Will refer her for a TAVR protocol CTA.  She does not need a dental evaluation since she lacks teeth.  Will refer for cardiothoracic surgery opinion as well.  The patient does have a right bundle branch block.  I did discuss this at length with the patient and her son that she is at an absolute higher risk for requiring a permanent pacemaker.  Will follow-up her membranous septum like to see if we can mitigate this with a higher implant strategy.  Because of this we will place a temporary pacemaker in her right internal jugular vein if TAVR is pursued.  I have personally reviewed the patients imaging data as summarized above.  I have reviewed the natural history of aortic stenosis with the patient and family members who are present today. We have discussed the limitations of medical therapy and the poor prognosis associated with symptomatic aortic stenosis. We have also reviewed potential treatment options, including palliative medical therapy, conventional surgical aortic valve replacement, and transcatheter aortic valve replacement. We discussed treatment options in the context of this patient's specific comorbid medical conditions.   All of the patient's questions were answered today. Will make further recommendations based on the results of studies outlined above.   Total time spent with patient today 50 minutes. This includes reviewing records, evaluating the patient and coordinating care.   Orbie Pyo, MD  10/20/2023 5:27 PM    [facility] [address] [place], [place], [place]  [address] Phone: [phone]; Fax: [phone]

## 2023-10-20 NOTE — Progress Notes (Addendum)
Pre Surgical Assessment: 5 M Walk Test  65M=16.89ft  5 Meter Walk Test- trial 1: 7.31 seconds 5 Meter Walk Test- trial 2: 6.09 seconds 5 Meter Walk Test- trial 3: 7.27 seconds 5 Meter Walk Test Average: 6.89 seconds    ___________________________  Procedure Type: Isolated AVR Perioperative Outcome Estimate % Operative Mortality 3.23% Morbidity & Mortality 9.26% Stroke 0.921% Renal Failure 1.74% Reoperation 2.73% Prolonged Ventilation 5.34% Deep Sternal Wound Infection 0.174% Long Hospital Stay (>14 days) 5.96% Short Hospital Stay (<6 days)* 37%

## 2023-10-26 ENCOUNTER — Encounter: Payer: Self-pay | Admitting: Family Medicine

## 2023-10-26 MED ORDER — FLUOXETINE HCL 40 MG PO CAPS: 80.0000 mg | ORAL_CAPSULE | Freq: Every day | ORAL | 0 refills | Status: DC

## 2023-10-28 ENCOUNTER — Other Ambulatory Visit
Admission: RE | Admit: 2023-10-28 | Discharge: 2023-10-28 | Disposition: A | Discharge status: Home / Self Care | Payer: Medicare Other | Source: Ambulatory Visit | Attending: Internal Medicine | Admitting: Internal Medicine

## 2023-10-28 ENCOUNTER — Ambulatory Visit: Payer: Medicare Other | Attending: Physician Assistant

## 2023-10-28 DIAGNOSIS — I6523 Occlusion and stenosis of bilateral carotid arteries: Secondary | ICD-10-CM | POA: Diagnosis not present

## 2023-10-28 DIAGNOSIS — Z01812 Encounter for preprocedural laboratory examination: Secondary | ICD-10-CM | POA: Diagnosis not present

## 2023-10-28 DIAGNOSIS — I350 Nonrheumatic aortic (valve) stenosis: Secondary | ICD-10-CM | POA: Diagnosis not present

## 2023-10-28 LAB — BASIC METABOLIC PANEL
Anion gap: 8 (ref 5–15)
BUN: 11 mg/dL (ref 8–23)
CO2: 24 mmol/L (ref 22–32)
Calcium: 9.2 mg/dL (ref 8.9–10.3)
Chloride: 106 mmol/L (ref 98–111)
Creatinine, Ser: 0.79 mg/dL (ref 0.44–1.00)
GFR, Estimated: >60 mL/min (ref >60)
Glucose, Bld: 131 mg/dL — ABNORMAL HIGH (ref 70–99)
Potassium: 4.1 mmol/L (ref 3.5–5.1)
Sodium: 138 mmol/L (ref 135–145)

## 2023-10-28 NOTE — Addendum Note (Signed)
Addended by: Lonell Face C on: 10/28/2023 02:21 PM   Modules accepted: Orders

## 2023-11-01 ENCOUNTER — Other Ambulatory Visit: Payer: Self-pay | Admitting: *Deleted

## 2023-11-01 ENCOUNTER — Encounter: Payer: Self-pay | Admitting: Family Medicine

## 2023-11-01 ENCOUNTER — Encounter: Payer: Self-pay | Admitting: Cardiovascular Disease

## 2023-11-01 MED ORDER — METOPROLOL TARTRATE 25 MG PO TABS: ORAL_TABLET | ORAL | 1 refills | Status: DC

## 2023-11-01 MED ORDER — BUPROPION HCL ER (XL) 150 MG PO TB24: 150.0000 mg | ORAL_TABLET | Freq: Every day | ORAL | 1 refills | Status: DC

## 2023-11-10 ENCOUNTER — Ambulatory Visit (HOSPITAL_COMMUNITY)
Admission: RE | Admit: 2023-11-10 | Discharge: 2023-11-10 | Disposition: A | Discharge status: Home / Self Care | Payer: Medicare Other | Source: Ambulatory Visit | Attending: Family Medicine | Admitting: Family Medicine

## 2023-11-10 DIAGNOSIS — I2510 Atherosclerotic heart disease of native coronary artery without angina pectoris: Secondary | ICD-10-CM | POA: Diagnosis not present

## 2023-11-10 DIAGNOSIS — I700 Atherosclerosis of aorta: Secondary | ICD-10-CM | POA: Diagnosis not present

## 2023-11-10 DIAGNOSIS — Z981 Arthrodesis status: Secondary | ICD-10-CM | POA: Diagnosis not present

## 2023-11-10 DIAGNOSIS — I350 Nonrheumatic aortic (valve) stenosis: Secondary | ICD-10-CM | POA: Diagnosis not present

## 2023-11-10 DIAGNOSIS — Z90710 Acquired absence of both cervix and uterus: Secondary | ICD-10-CM | POA: Diagnosis not present

## 2023-11-10 MED ORDER — IOHEXOL 350 MG/ML SOLN
95.0000 mL | Freq: Once | INTRAVENOUS | Status: AC | PRN
  Administered 2023-11-10: 95 mL via INTRAVENOUS

## 2023-11-25 ENCOUNTER — Encounter: Payer: Medicare Other | Admitting: Thoracic Surgery (Cardiothoracic Vascular Surgery)

## 2023-11-29 ENCOUNTER — Encounter: Payer: Self-pay | Admitting: Family Medicine

## 2023-11-29 MED ORDER — GABAPENTIN 100 MG PO CAPS: 100.0000 mg | ORAL_CAPSULE | Freq: Two times a day (BID) | ORAL | 3 refills | Status: DC

## 2023-11-29 NOTE — Telephone Encounter (Signed)
Last office visit 02/10/2023 for CPE.   Last refilled 08/12/2022 for #[id number] with 3 refills.  Next Appt: No future appointments with PCP.

## 2023-12-06 ENCOUNTER — Encounter: Payer: Medicare Other | Admitting: Thoracic Surgery (Cardiothoracic Vascular Surgery)

## 2023-12-08 NOTE — Progress Notes (Signed)
 [address]       [place] [address]             [phone]           Amanda [name] [facility] Medical Record #[id number] Date of Birth: September 15, 1951  Amanda Lurena POUR, MD Watt Mirza, MD  Chief Complaint:Increasing SOB     History of Present Illness:     Pt is a very pleasant 73 yo female who has had significant increase in DOE over the past year. Pt now has to rest on her walk to the mailbox and has to walk slower to keep from having SOB. She also has some chest heaviness with her SOB and occasionally gets lightheaded but not frank syncope. She underwent echo with EF of 60% and now severe AS with a mean gradient of and an AVA of 0.66cm2. On cath she has nonobstructive CAD. CTA has acceptable anatomy for femoral access TAVR with a medtronic prosthesis. She has a RBBB on her latest EKG. She has recently lost her husband over Thanksgiving due to illness and has had a rough couple of months      Past Medical History:  Diagnosis Date   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 2013: nl LV sys fxn, mild MR, no evidence of pulm htn; b. TTE 8/17: EF 60-65%, no RWMA, nl LV dia fxn,  mild AS, mod AI   CAD (coronary artery disease), native coronary artery 11/26/2020   Controlled type 2 diabetes mellitus with diabetic autonomic neuropathy, without long-term current use of insulin  (HCC) 08/10/2007   Qualifier: Diagnosis of  By: Bartley MD, Lamar Mulch    COPD (chronic obstructive pulmonary disease) (HCC) 10/30/2016   Depression    Fracture in accidental fall    GAD (generalized anxiety disorder)    GERD (gastroesophageal reflux disease)    Hiatal hernia with gastroesophageal reflux 1997   Hyperlipidemia    Hypertension    Osteoporosis    Tobacco abuse     Past Surgical History:  Procedure Laterality Date   ABDOMINAL HYSTERECTOMY  1993   ANTERIOR CERVICAL DECOMP/DISCECTOMY FUSION N/A 10/26/2018   Procedure: ACDF C4-C5 C5-C6  C6-C7;  Surgeon: Onetha Kuba, MD;  Location: [facility] OR;  Service: Neurosurgery;  Laterality: N/A;   APPENDECTOMY     BACK SURGERY     BREAST BIOPSY Right 2007   benign   CHOLECYSTECTOMY     COLONOSCOPY     30 years ago was normal per pt.    DILATION AND CURETTAGE OF UTERUS     ESOPHAGEAL DILATION     x 3   ESOPHAGOGASTRODUODENOSCOPY (EGD) WITH PROPOFOL  N/A 02/06/2022   Procedure: ESOPHAGOGASTRODUODENOSCOPY (EGD) WITH PROPOFOL ;  Surgeon: Therisa Bi, MD;  Location: [facility] ENDOSCOPY;  Service: Gastroenterology;  Laterality: N/A;   ESOPHAGOGASTRODUODENOSCOPY (EGD) WITH PROPOFOL  N/A 02/26/2022   Procedure: ESOPHAGOGASTRODUODENOSCOPY (EGD) WITH PROPOFOL ;  Surgeon: Therisa Bi, MD;  Location: [facility] ENDOSCOPY;  Service: Gastroenterology;  Laterality: N/A;  EGD+dilation per Dr. Therisa   fractured leg Right    INCISION AND DRAINAGE Right 12/02/2021   Procedure: INCISION AND DRAINAGE;  Surgeon: Kathlynn Sharper, MD;  Location: [facility] ORS;  Service: Orthopedics;  Laterality: Right;   LEFT HEART CATH AND CORONARY ANGIOGRAPHY N/A 11/11/2020   Procedure: LEFT HEART CATH AND CORONARY ANGIOGRAPHY;  Surgeon: Darron Deatrice LABOR, MD;  Location: [facility] INVASIVE CV LAB;  Service: Cardiovascular;  Laterality: N/A;  OOPHORECTOMY     OVARIAN CYST REMOVAL  1972   RIGHT/LEFT HEART CATH AND CORONARY ANGIOGRAPHY Bilateral 10/04/2023   Procedure: RIGHT/LEFT HEART CATH AND CORONARY ANGIOGRAPHY;  Surgeon: Darron Deatrice LABOR, MD;  Location: [facility] INVASIVE CV LAB;  Service: Cardiovascular;  Laterality: Bilateral;   SALIVARY GLAND SURGERY     TONSILLECTOMY     4 yoa   TOTAL KNEE ARTHROPLASTY Left 12/24/2015   Procedure: LEFT TOTAL KNEE ARTHROPLASTY;  Surgeon: Glendia Cordella Hutchinson, MD;  Location: [facility] OR;  Service: Orthopedics;  Laterality: Left;   TOTAL KNEE ARTHROPLASTY Right 12/30/2017   Procedure: RIGHT TOTAL KNEE ARTHROPLASTY;  Surgeon: Hutchinson Cordella Glendia, MD;  Location: [facility] OR;  Service: Orthopedics;  Laterality: Right;   WRIST FRACTURE  SURGERY  11/2008    Social History   Tobacco Use  Smoking Status Former   Average packs/day: 0.8 packs/day for 45.0 years (33.8 ttl pk-yrs)   Types: Cigarettes   Start date: 01/2023  Smokeless Tobacco Never    Social History   Substance and Sexual Activity  Alcohol Use Yes   Alcohol/week: 0.0 standard drinks of alcohol   Comment: glass of wine twice a month     Social History   Socioeconomic History   Marital status: Married    Spouse name: Not on file   Number of children: 2   Years of education: Not on file   Highest education level: Not on file  Occupational History   Occupation: [employer]    Employer: [employer]  Tobacco Use   Smoking status: Former    Average packs/day: 0.8 packs/day for 45.0 years (33.8 ttl pk-yrs)    Types: Cigarettes    Start date: 01/2023   Smokeless tobacco: Never  Vaping Use   Vaping status: Never Used  Substance and Sexual Activity   Alcohol use: Yes    Alcohol/week: 0.0 standard drinks of alcohol    Comment: glass of wine twice a month    Drug use: No   Sexual activity: Not Currently  Other Topics Concern   Not on file  Social History Narrative   Not on file   Social Drivers of Health   Financial Resource Strain: Low Risk  (12/18/2022)   Overall Financial Resource Strain (CARDIA)    Difficulty of Paying Living Expenses: Not hard at all  Food Insecurity: No Food Insecurity (12/18/2022)   Hunger Vital Sign    Worried About Running Out of Food in the Last Year: Never true    Ran Out of Food in the Last Year: Never true  Transportation Needs: No Transportation Needs (12/18/2022)   PRAPARE - Administrator, Civil Service (Medical): No    Lack of Transportation (Non-Medical): No  Physical Activity: Unknown (12/18/2022)   Exercise Vital Sign    Days of Exercise per Week: 0 days    Minutes of Exercise per Session: Not on file  Stress: No Stress Concern Present (12/18/2022)   [employer] - Occupational Stress Questionnaire    Feeling of Stress : Not at all  Social Connections: Unknown (12/18/2022)   Social Connection and Isolation Panel [NHANES]    Frequency of Communication with Friends and Family: Not on file    Frequency of Social Gatherings with Friends and Family: More than three times a week    Attends Religious Services: Not on file    Active Member of Clubs or Organizations: Not on file    Attends Banker  Meetings: Not on file    Marital Status: Married  Intimate Partner Violence: Not At Risk (12/18/2022)   Humiliation, Afraid, Rape, and Kick questionnaire    Fear of Current or Ex-Partner: No    Emotionally Abused: No    Physically Abused: No    Sexually Abused: No    Allergies  Allergen Reactions   Honey Bee Venom [Bee Venom] Anaphylaxis   Diazepam Other (See Comments)    REACTION: pt states she gets very angry and abusive verbally on this medication     Current Outpatient Medications  Medication Sig Dispense Refill   acetaminophen  (TYLENOL ) 650 MG CR tablet Take 650 mg by mouth 3 times/day as needed-between meals & bedtime (for headaches).     albuterol  (VENTOLIN  HFA) 108 (90 Base) MCG/ACT inhaler Inhale 2 puffs into the lungs every 4 (four) hours as needed for wheezing or shortness of breath. 3 each 2   amitriptyline  (ELAVIL ) 50 MG tablet Take 0.5 tablets (25 mg total) by mouth at bedtime. 45 tablet 1   Ascorbic Acid  (VITAMIN C ) 1000 MG tablet Take 1,000 mg by mouth daily.     aspirin  EC 81 MG tablet Take 81 mg by mouth daily.     b complex vitamins capsule Take 1 capsule by mouth daily.     buPROPion  (WELLBUTRIN  XL) 150 MG 24 hr tablet Take 1 tablet (150 mg total) by mouth daily. 90 tablet 1   calcium  carbonate (TUMS - DOSED IN MG ELEMENTAL CALCIUM ) 500 MG chewable tablet Chew 2 tablets by mouth daily as needed for indigestion or heartburn.     Cholecalciferol  (VITAMIN D ) 1000 UNITS capsule Take 1,000 Units  by mouth daily.      clonazePAM  (KLONOPIN ) 0.5 MG tablet Take 1 tablet (0.5 mg total) by mouth 2 (two) times daily as needed. 60 tablet 5   cyclobenzaprine  (FLEXERIL ) 10 MG tablet Take 1 tablet by mouth three times daily as needed for muscle spasm 60 tablet 3   dimenhyDRINATE (DRAMAMINE) 50 MG tablet Take 50 mg by mouth every 8 (eight) hours as needed.     donepezil  (ARICEPT ) 5 MG tablet TAKE 1 TABLET BY MOUTH AT BEDTIME 90 tablet 1   ezetimibe  (ZETIA ) 10 MG tablet Take 1 tablet (10 mg total) by mouth daily. 90 tablet 1   FLUoxetine  (PROZAC ) 40 MG capsule Take 2 capsules (80 mg total) by mouth daily. 180 capsule 0   fluticasone  (FLONASE) 50 MCG/ACT nasal spray Place 2 sprays into both nostrils daily.     gabapentin  (NEURONTIN ) 100 MG capsule Take 1 capsule (100 mg total) by mouth 2 (two) times daily. 180 capsule 3   guaiFENesin  (MUCINEX ) 600 MG 12 hr tablet Take 600 mg by mouth daily.     metoprolol  tartrate (LOPRESSOR ) 25 MG tablet TAKE 1/2 TABLET TWICE DAILY 90 tablet 1   Multiple Vitamin (MULTIVITAMIN) tablet Take 1 tablet by mouth daily.     naproxen  sodium (ALEVE ) 220 MG tablet Take 220 mg by mouth daily.     ondansetron  (ZOFRAN -ODT) 4 MG disintegrating tablet Take 1 tablet (4 mg total) by mouth every 8 (eight) hours as needed for nausea or vomiting. 15 tablet 0   pantoprazole  (PROTONIX ) 40 MG tablet TAKE 1 TABLET 30 MINUTES PRIOR TO BREAKFAST 90 tablet 3   rosuvastatin  (CRESTOR ) 40 MG tablet Take 1 tablet (40 mg total) by mouth daily. 90 tablet 1   Simethicone  125 MG CAPS Take 125 mg by mouth daily as needed (gas).  vitamin E 180 MG (400 UNITS) capsule Take 400 Units by mouth daily.     No current facility-administered medications for this visit.     Family History  Problem Relation Age of Onset   Hyperlipidemia Mother    Arthritis Mother    Diabetes Mother    Heart disease Mother    Colon polyps Mother    Cancer Paternal Grandmother        breast   Hypercholesterolemia  Sister    Colon polyps Sister    Breast cancer Sister    Colon cancer Neg Hx    Esophageal cancer Neg Hx    Rectal cancer Neg Hx    Stomach cancer Neg Hx    Pancreatic cancer Neg Hx        Physical Exam: Teeth in good repair Lungs: Clear  Card: RR with 3/6 sem Ext: No edema Neuro; alert without focal deficits     Diagnostic Studies & Laboratory data: I have personally reviewed the following studies and agree with the findings   TTE (09/2023) IMPRESSIONS     1. Left ventricular ejection fraction, by estimation, is 60 to 65%. The  left ventricle has normal function. The left ventricle has no regional  wall motion abnormalities. There is mild left ventricular hypertrophy.  Left ventricular diastolic parameters  are consistent with Grade I diastolic dysfunction (impaired relaxation).   2. Right ventricular systolic function is normal. The right ventricular  size is normal.   3. The mitral valve is degenerative. Mild mitral valve regurgitation.   4. AV mean gradient , PG , DVI 0.24, AVA 0.7cm2. The aortic  valve is calcified. Aortic valve regurgitation is mild. Severe aortic  valve stenosis.   5. The inferior vena cava is dilated in size with >50% respiratory  variability, suggesting right atrial pressure of 8 mmHg.   FINDINGS   Left Ventricle: Left ventricular ejection fraction, by estimation, is 60  to 65%. The left ventricle has normal function. The left ventricle has no  regional wall motion abnormalities. Global longitudinal strain performed  but not reported based on  interpreter judgement due to suboptimal tracking. The left ventricular  internal cavity size was normal in size. There is mild left ventricular  hypertrophy. Left ventricular diastolic parameters are consistent with  Grade I diastolic dysfunction (impaired  relaxation).   Right Ventricle: The right ventricular size is normal. No increase in  right ventricular wall thickness. Right  ventricular systolic function is  normal.   Left Atrium: Left atrial size was normal in size.   Right Atrium: Right atrial size was normal in size.   Pericardium: There is no evidence of pericardial effusion.   Mitral Valve: The mitral valve is degenerative in appearance. Mild to  moderate mitral annular calcification. Mild mitral valve regurgitation.   Tricuspid Valve: The tricuspid valve is normal in structure. Tricuspid  valve regurgitation is not demonstrated.   Aortic Valve: AV mean gradient , PG , DVI 0.24, AVA 0.7cm2.  The aortic valve is calcified. Aortic valve regurgitation is mild. Severe  aortic stenosis is present. Aortic valve mean gradient measures 33.0 mmHg.  Aortic valve peak gradient  measures 55.5 mmHg. Aortic valve area, by VTI measures 0.66 cm.   Pulmonic Valve: The pulmonic valve was normal in structure. Pulmonic valve  regurgitation is not visualized.   Aorta: The aortic root and ascending aorta are structurally normal, with  no evidence of dilitation.   Venous: The inferior vena cava is dilated in size  with greater than 50%  respiratory variability, suggesting right atrial pressure of 8 mmHg.   IAS/Shunts: No atrial level shunt detected by color flow Doppler.     LEFT VENTRICLE  PLAX 2D  LVOT diam:     1.90 cm   Diastology  LV SV:         63        LV e' medial:    5.98 cm/s  LV SV Index:   29        LV E/e' medial:  18.1  LVOT Area:     2.84 cm  LV e' lateral:   6.31 cm/s                           LV E/e' lateral: 17.1                             3D Volume EF:                           3D EF:        52 %                           LV EDV:       104 ml                           LV ESV:       50 ml                           LV SV:        54 ml   RIGHT VENTRICLE             IVC  RV S prime:     15.10 cm/s  IVC diam: 2.60 cm  TAPSE (M-mode): 3.1 cm   LEFT ATRIUM             Index  LA Vol (A2C):   74.5 ml 34.64 ml/m  LA Vol (A4C):    46.8 ml 21.76 ml/m  LA Biplane Vol: 59.0 ml 27.44 ml/m   AORTIC VALVE  AV Area (Vmax):    0.73 cm  AV Area (Vmean):   0.69 cm  AV Area (VTI):     0.66 cm  AV Vmax:           372.50 cm/s  AV Vmean:          266.000 cm/s  AV VTI:            0.955 m  AV Peak Grad:      55.5 mmHg  AV Mean Grad:      33.0 mmHg  LVOT Vmax:         95.85 cm/s  LVOT Vmean:        64.800 cm/s  LVOT VTI:          0.224 m  LVOT/AV VTI ratio: 0.23    AORTA  Ao Root diam: 3.00 cm  Ao Asc diam:  2.90 cm   MITRAL VALVE  MV Area (PHT): 3.08 cm     SHUNTS  MV Decel Time: 246 msec     Systemic VTI:  0.22 m  MV E velocity: 108.00 cm/s  Systemic Diam: 1.90 cm  MV A velocity: 140.00 cm/s  MV E/A ratio:  0.77   CATH (09/2023) Conclusion  1.  Mild nonobstructive coronary artery disease. 2.  Moderate to severe aortic stenosis with mean gradient of 32 mmHg and valve area of 1.1 cm. 3.  Right heart catheterization showed normal RA pressure, mildly elevated wedge pressure, minimal pulmonary hypertension and normal cardiac output.   Recommendations: Proceed with TAVR evaluation.   Recent Radiology Findings:   CTA (11/2023) FINDINGS: Image quality: Excellent.   Noise artifact is: Limited.   Valve Morphology: Tricuspid aortic valve with severely calcified leaflets that are diffusely calcified. Restricted leaflet motion in systole.   Aortic Valve Calcium  score: 1946   Aortic annular dimension:   Phase assessed: 35%   Annular area: 605 mm2   Annular perimeter: 88.9 mm   Max diameter: 30.4 mm   Min diameter: 26.9 mm   Annular and subannular calcification: Moderate, single protruding calcification under the LCC.   Membranous septum length: 12.7 mm   Optimal coplanar projection: LAO 16 CRA 1   Coronary Artery Height above Annulus:   Left Main: 12.5 mm   Right Coronary: 20.5 mm   Sinus of Valsalva Measurements:   Non-coronary: 30 mm   Right-coronary: 30 mm   Left-coronary: 30 mm    Sinus of Valsalva Height:   Non-coronary: 21.2 mm   Right-coronary: 24.3 mm   Left-coronary: 21.0 mm   Sinotubular Junction: 27 mm   Ascending Thoracic Aorta: 29 mm   Coronary Arteries: Normal coronary origin. Right dominance. The study was performed without use of NTG and is insufficient for plaque evaluation. Please refer to recent cardiac catheterization for coronary assessment.   Right Atrium: Right atrial size is within normal limits.   Right Ventricle: The right ventricular cavity is within normal limits.   Left Atrium: Left atrial size is normal in size with no left atrial appendage filling defect.   Left Ventricle: The ventricular cavity size is within normal limits.   Pulmonary arteries: Normal in size without proximal filling defect.   Pulmonary veins: Normal pulmonary venous drainage.   Pericardium: Normal thickness with no significant effusion or calcium  present.   Mitral Valve: The mitral valve is normal structure without significant calcification.   Extra-cardiac findings: See attached radiology report for non-cardiac structures.   IMPRESSION: 1. Annular measurements support a 29 mm S3 or 34 mm Evolut Pro.   2. Moderate, single protruding calcification under the LCC.   3. Sufficient coronary to annulus distance.   4. Optimal Fluoroscopic Angle for Delivery: LAO 16 CRA 1    Recent Lab Findings: Lab Results  Component Value Date   WBC 4.8 09/22/2023   HGB 10.5 (L) 10/04/2023   HCT 31.0 (L) 10/04/2023   PLT 153 09/22/2023   GLUCOSE 131 (H) 10/28/2023   CHOL 138 01/28/2023   TRIG 94.0 01/28/2023   HDL 62.40 01/28/2023   LDLDIRECT 209.2 05/28/2009   LDLCALC 57 01/28/2023   ALT 20 01/28/2023   AST 25 01/28/2023   NA 138 10/28/2023   K 4.1 10/28/2023   CL 106 10/28/2023   CREATININE 0.79 10/28/2023   BUN 11 10/28/2023   CO2 24 10/28/2023   TSH 1.33 01/28/2023   INR 1.0 11/11/2020   HGBA1C 6.4 01/28/2023      Assessment / Plan:      73 yo female with NYHA class 2-3 symptoms of severe AS with normal LV function and no CAD. She has a class I indication for AVR. Due  to her age and comorbidities of DM, HTN, HLD, obesity, CKD II and COPD, I feel she would best be served with TAVR and agree with Dr Amanda that femoral access with 34 mm Evolute valve would be appropriate. She has increased risk of PPM due to RBBB. We discussed all the risks and goals and recovery from TAVR and she wishes to proceed. She is a bail out candidate   I have spent 60 min in review of the records, viewing studies and in face to face with patient and in coordination of future care    Deward Kallman 12/08/2023 5:50 PM

## 2023-12-09 ENCOUNTER — Institutional Professional Consult (permissible substitution): Payer: Medicare Other | Admitting: Thoracic Surgery (Cardiothoracic Vascular Surgery)

## 2023-12-09 ENCOUNTER — Encounter: Payer: Self-pay | Admitting: Thoracic Surgery (Cardiothoracic Vascular Surgery)

## 2023-12-09 VITALS — BP 155/71 | HR 79 | Resp 20 | Ht 67.0 in | Wt 224.0 lb

## 2023-12-09 DIAGNOSIS — I350 Nonrheumatic aortic (valve) stenosis: Secondary | ICD-10-CM | POA: Diagnosis not present

## 2023-12-09 NOTE — Patient Instructions (Signed)
 TAVR

## 2023-12-14 ENCOUNTER — Other Ambulatory Visit: Payer: Self-pay

## 2023-12-14 DIAGNOSIS — I350 Nonrheumatic aortic (valve) stenosis: Secondary | ICD-10-CM

## 2023-12-17 ENCOUNTER — Ambulatory Visit (HOSPITAL_COMMUNITY)
Admission: RE | Admit: 2023-12-17 | Discharge: 2023-12-17 | Disposition: A | Discharge status: Home / Self Care | Payer: Medicare Other | Source: Ambulatory Visit | Attending: Internal Medicine | Admitting: Internal Medicine

## 2023-12-17 ENCOUNTER — Encounter (HOSPITAL_COMMUNITY)
Admission: RE | Admit: 2023-12-17 | Discharge: 2023-12-17 | Disposition: A | Discharge status: Home / Self Care | Payer: Medicare Other | Source: Ambulatory Visit | Attending: Internal Medicine | Admitting: Internal Medicine

## 2023-12-17 ENCOUNTER — Telehealth: Payer: Self-pay | Admitting: Physician Assistant

## 2023-12-17 DIAGNOSIS — R9431 Abnormal electrocardiogram [ECG] [EKG]: Secondary | ICD-10-CM | POA: Diagnosis not present

## 2023-12-17 DIAGNOSIS — Z01818 Encounter for other preprocedural examination: Secondary | ICD-10-CM | POA: Diagnosis not present

## 2023-12-17 DIAGNOSIS — I350 Nonrheumatic aortic (valve) stenosis: Secondary | ICD-10-CM | POA: Insufficient documentation

## 2023-12-17 DIAGNOSIS — Z01812 Encounter for preprocedural laboratory examination: Secondary | ICD-10-CM | POA: Diagnosis present

## 2023-12-17 DIAGNOSIS — Z01810 Encounter for preprocedural cardiovascular examination: Secondary | ICD-10-CM | POA: Diagnosis present

## 2023-12-17 LAB — URINALYSIS, ROUTINE W REFLEX MICROSCOPIC
Bilirubin Urine: NEGATIVE
Glucose, UA: NEGATIVE mg/dL
Ketones, ur: NEGATIVE mg/dL
Nitrite: NEGATIVE
Protein, ur: NEGATIVE mg/dL
Specific Gravity, Urine: 1.008 (ref 1.005–1.030)
pH: 6 (ref 5.0–8.0)

## 2023-12-17 LAB — TYPE AND SCREEN
ABO/RH(D): A POS
Antibody Screen: NEGATIVE

## 2023-12-17 LAB — COMPREHENSIVE METABOLIC PANEL
ALT: 17 U/L (ref 0–44)
AST: 22 U/L (ref 15–41)
Albumin: 3.6 g/dL (ref 3.5–5.0)
Alkaline Phosphatase: 34 U/L — ABNORMAL LOW (ref 38–126)
Anion gap: 8 (ref 5–15)
BUN: 10 mg/dL (ref 8–23)
CO2: 24 mmol/L (ref 22–32)
Calcium: 9.2 mg/dL (ref 8.9–10.3)
Chloride: 106 mmol/L (ref 98–111)
Creatinine, Ser: 0.86 mg/dL (ref 0.44–1.00)
GFR, Estimated: >60 mL/min (ref >60)
Glucose, Bld: 132 mg/dL — ABNORMAL HIGH (ref 70–99)
Potassium: 4 mmol/L (ref 3.5–5.1)
Sodium: 138 mmol/L (ref 135–145)
Total Bilirubin: 0.9 mg/dL (ref 0.0–1.2)
Total Protein: 5.9 g/dL — ABNORMAL LOW (ref 6.5–8.1)

## 2023-12-17 LAB — SURGICAL PCR SCREEN
MRSA, PCR: NEGATIVE
Staphylococcus aureus: NEGATIVE

## 2023-12-17 LAB — CBC
HCT: 37.3 % (ref 36.0–46.0)
Hemoglobin: 12.3 g/dL (ref 12.0–15.0)
MCH: 33.2 pg (ref 26.0–34.0)
MCHC: 33 g/dL (ref 30.0–36.0)
MCV: 100.8 fL — ABNORMAL HIGH (ref 80.0–100.0)
Platelets: 154 10*3/uL (ref 150–400)
RBC: 3.7 MIL/uL — ABNORMAL LOW (ref 3.87–5.11)
RDW: 11.4 % — ABNORMAL LOW (ref 11.5–15.5)
WBC: 4.9 10*3/uL (ref 4.0–10.5)
nRBC: 0 % (ref 0.0–0.2)

## 2023-12-17 LAB — PROTIME-INR
INR: 1.1 (ref 0.8–1.2)
Prothrombin Time: 14.1 s (ref 11.4–15.2)

## 2023-12-17 NOTE — Telephone Encounter (Signed)
  [facility]   MULTIDISCIPLINARY HEART VALVE TEAM  UA noted to be slightly abnormal. She has no s/s UTI. Abx not indicated.   Cline Crock PA-C  [facility]

## 2023-12-20 MED ORDER — NOREPINEPHRINE 4 MG/250ML-% IV SOLN
0.0000 ug/min | INTRAVENOUS | Status: AC
  Administered 2023-12-21: 1 ug/min via INTRAVENOUS
  Filled 2023-12-20: qty 250

## 2023-12-20 MED ORDER — CEFAZOLIN SODIUM-DEXTROSE 2-4 GM/100ML-% IV SOLN
2.0000 g | INTRAVENOUS | Status: AC
  Administered 2023-12-21: 2 g via INTRAVENOUS
  Filled 2023-12-20 (×2): qty 100

## 2023-12-20 MED ORDER — DEXMEDETOMIDINE HCL IN NACL 400 MCG/100ML IV SOLN
0.1000 ug/kg/h | INTRAVENOUS | Status: AC
Start: 2023-12-21 — End: 2023-12-22
  Administered 2023-12-21: 1 ug/kg/h via INTRAVENOUS
  Administered 2023-12-21: 101.6 ug via INTRAVENOUS
  Filled 2023-12-20: qty 100

## 2023-12-20 MED ORDER — MAGNESIUM SULFATE 50 % IJ SOLN
40.0000 meq | INTRAMUSCULAR | Status: DC
  Filled 2023-12-20: qty 9.85

## 2023-12-20 MED ORDER — HEPARIN 30,000 UNITS/1000 ML (OHS) CELLSAVER SOLUTION
Status: DC
  Filled 2023-12-20: qty 1000

## 2023-12-20 MED ORDER — POTASSIUM CHLORIDE 2 MEQ/ML IV SOLN
80.0000 meq | INTRAVENOUS | Status: DC
  Filled 2023-12-20: qty 40

## 2023-12-20 NOTE — H&P (Signed)
 [address]       [place] [address]             [phone]                                   Amanda [name] [facility] Medical Record #[id number] Date of Birth: 1951/11/10   Wendel Lurena POUR, MD Watt Mirza, MD   Chief Complaint:Increasing SOB      History of Present Illness:     Pt is a very pleasant 73 yo female who has had significant increase in DOE over the past year. Pt now has to rest on her walk to the mailbox and has to walk slower to keep from having SOB. She also has some chest heaviness with her SOB and occasionally gets lightheaded but not frank syncope. She underwent echo with EF of 60% and now severe AS with a mean gradient of and an AVA of 0.66cm2. On cath she has nonobstructive CAD. CTA has acceptable anatomy for femoral access TAVR with a medtronic prosthesis. She has a RBBB on her latest EKG. She has recently lost her husband over Thanksgiving due to illness and has had a rough couple of months             Past Medical History:  Diagnosis Date   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 sting      bee stings   Aortic stenosis      a. 2013: nl LV sys fxn, mild MR, no evidence of pulm htn; b. TTE 8/17: EF 60-65%, no RWMA, nl LV dia fxn,  mild AS, mod AI   CAD (coronary artery disease), native coronary artery 11/26/2020   Controlled type 2 diabetes mellitus with diabetic autonomic neuropathy, without long-term current use of insulin  (HCC) 08/10/2007    Qualifier: Diagnosis of  By: Bartley MD, Lamar Mulch    COPD (chronic obstructive pulmonary disease) (HCC) 10/30/2016   Depression     Fracture in accidental fall     GAD (generalized anxiety disorder)     GERD (gastroesophageal reflux disease)     Hiatal hernia with gastroesophageal reflux 1997   Hyperlipidemia     Hypertension     Osteoporosis     Tobacco abuse                 Past Surgical History:  Procedure Laterality Date   ABDOMINAL HYSTERECTOMY   1993   ANTERIOR CERVICAL  DECOMP/DISCECTOMY FUSION N/A 10/26/2018    Procedure: ACDF C4-C5 C5-C6 C6-C7;  Surgeon: Onetha Kuba, MD;  [facility] OR;  Service: Neurosurgery;  Laterality: N/A;   APPENDECTOMY       BACK SURGERY       BREAST BIOPSY Right 2007    benign   CHOLECYSTECTOMY       COLONOSCOPY        30 years ago was normal per pt.    DILATION AND CURETTAGE OF UTERUS       ESOPHAGEAL DILATION        x 3   ESOPHAGOGASTRODUODENOSCOPY (EGD) WITH PROPOFOL  N/A 02/06/2022    Procedure: ESOPHAGOGASTRODUODENOSCOPY (EGD) WITH PROPOFOL ;  Surgeon: Therisa Bi, MD;  Location: [facility] ENDOSCOPY;  Service: Gastroenterology;  Laterality: N/A;   ESOPHAGOGASTRODUODENOSCOPY (EGD) WITH PROPOFOL  N/A 02/26/2022    Procedure: ESOPHAGOGASTRODUODENOSCOPY (EGD) WITH PROPOFOL ;  Surgeon: Therisa Bi, MD;  Location: [facility] ENDOSCOPY;  Service: Gastroenterology;  Laterality: N/A;  EGD+dilation per Dr. Therisa   fractured leg Right     INCISION AND DRAINAGE Right 12/02/2021    Procedure: INCISION AND DRAINAGE;  Surgeon: Kathlynn Sharper, MD;  Location: [facility] ORS;  Service: Orthopedics;  Laterality: Right;   LEFT HEART CATH AND CORONARY ANGIOGRAPHY N/A 11/11/2020    Procedure: LEFT HEART CATH AND CORONARY ANGIOGRAPHY;  Surgeon: Darron Deatrice LABOR, MD;  Location: [facility] INVASIVE CV LAB;  Service: Cardiovascular;  Laterality: N/A;   OOPHORECTOMY       OVARIAN CYST REMOVAL   1972   RIGHT/LEFT HEART CATH AND CORONARY ANGIOGRAPHY Bilateral 10/04/2023    Procedure: RIGHT/LEFT HEART CATH AND CORONARY ANGIOGRAPHY;  Surgeon: Darron Deatrice LABOR, MD;  Location: [facility] INVASIVE CV LAB;  Service: Cardiovascular;  Laterality: Bilateral;   SALIVARY GLAND SURGERY       TONSILLECTOMY        4 yoa   TOTAL KNEE ARTHROPLASTY Left 12/24/2015    Procedure: LEFT TOTAL KNEE ARTHROPLASTY;  Surgeon: Glendia Cordella Hutchinson, MD;  Location: [facility] OR;  Service: Orthopedics;  Laterality: Left;   TOTAL KNEE ARTHROPLASTY Right 12/30/2017    Procedure: RIGHT TOTAL KNEE ARTHROPLASTY;  Surgeon: Hutchinson Cordella Glendia, MD;  Location: [facility] OR;  Service: Orthopedics;  Laterality: Right;   WRIST FRACTURE SURGERY   11/2008          Tobacco Use History  Social History        Tobacco Use  Smoking Status Former   Average packs/day: 0.8 packs/day for 45.0 years (33.8 ttl pk-yrs)   Types: Cigarettes   Start date: 01/2023  Smokeless Tobacco Never      Social History        Substance and Sexual Activity  Alcohol Use Yes   Alcohol/week: 0.0 standard drinks of alcohol    Comment: glass of wine twice a month       Social History         Socioeconomic History   Marital status: Married      Spouse name: Not on file   Number of children: 2   Years of education: Not on file   Highest education level: Not on file  Occupational History   Occupation: [employer]      Employer: [employer]  Tobacco Use   Smoking status: Former      Average packs/day: 0.8 packs/day for 45.0 years (33.8 ttl pk-yrs)      Types: Cigarettes      Start date: 01/2023   Smokeless tobacco: Never  Vaping Use   Vaping status: Never Used  Substance and Sexual Activity   Alcohol use: Yes      Alcohol/week: 0.0 standard drinks of alcohol      Comment: glass of wine twice a month    Drug use: No   Sexual activity: Not Currently  Other Topics Concern   Not on file  Social History Narrative   Not on file    Social Drivers of Health        Financial Resource Strain: Low Risk  (12/18/2022)    Overall Financial Resource Strain (CARDIA)     Difficulty of Paying Living Expenses: Not hard at all  Food Insecurity: No Food Insecurity (12/18/2022)    Hunger Vital Sign     Worried About Running Out of Food in the Last Year: Never true     Ran Out of Food in the Last Year: Never true  Transportation Needs: No Transportation Needs (12/18/2022)  PRAPARE - Therapist, Art (Medical): No     Lack of Transportation (Non-Medical): No  Physical Activity: Unknown  (12/18/2022)    Exercise Vital Sign     Days of Exercise per Week: 0 days     Minutes of Exercise per Session: Not on file  Stress: No Stress Concern Present (12/18/2022)    [employer] of Occupational Health - Occupational Stress Questionnaire     Feeling of Stress : Not at all  Social Connections: Unknown (12/18/2022)    Social Connection and Isolation Panel [NHANES]     Frequency of Communication with Friends and Family: Not on file     Frequency of Social Gatherings with Friends and Family: More than three times a week     Attends Religious Services: Not on file     Active Member of Clubs or Organizations: Not on file     Attends Banker Meetings: Not on file     Marital Status: Married  Intimate Partner Violence: Not At Risk (12/18/2022)    Humiliation, Afraid, Rape, and Kick questionnaire     Fear of Current or Ex-Partner: No     Emotionally Abused: No     Physically Abused: No     Sexually Abused: No      Allergies       Allergies  Allergen Reactions   Honey Bee Venom [Bee Venom] Anaphylaxis   Diazepam Other (See Comments)      REACTION: pt states she gets very angry and abusive verbally on this medication                Current Outpatient Medications  Medication Sig Dispense Refill   acetaminophen  (TYLENOL ) 650 MG CR tablet Take 650 mg by mouth 3 times/day as needed-between meals & bedtime (for headaches).       albuterol  (VENTOLIN  HFA) 108 (90 Base) MCG/ACT inhaler Inhale 2 puffs into the lungs every 4 (four) hours as needed for wheezing or shortness of breath. 3 each 2   amitriptyline  (ELAVIL ) 50 MG tablet Take 0.5 tablets (25 mg total) by mouth at bedtime. 45 tablet 1   Ascorbic Acid  (VITAMIN C ) 1000 MG tablet Take 1,000 mg by mouth daily.       aspirin  EC 81 MG tablet Take 81 mg by mouth daily.       b complex vitamins capsule Take 1 capsule by mouth daily.       buPROPion  (WELLBUTRIN  XL) 150 MG 24 hr tablet Take 1 tablet (150 mg total) by mouth  daily. 90 tablet 1   calcium  carbonate (TUMS - DOSED IN MG ELEMENTAL CALCIUM ) 500 MG chewable tablet Chew 2 tablets by mouth daily as needed for indigestion or heartburn.       Cholecalciferol  (VITAMIN D ) 1000 UNITS capsule Take 1,000 Units by mouth daily.        clonazePAM  (KLONOPIN ) 0.5 MG tablet Take 1 tablet (0.5 mg total) by mouth 2 (two) times daily as needed. 60 tablet 5   cyclobenzaprine  (FLEXERIL ) 10 MG tablet Take 1 tablet by mouth three times daily as needed for muscle spasm 60 tablet 3   dimenhyDRINATE (DRAMAMINE) 50 MG tablet Take 50 mg by mouth every 8 (eight) hours as needed.       donepezil  (ARICEPT ) 5 MG tablet TAKE 1 TABLET BY MOUTH AT BEDTIME 90 tablet 1   ezetimibe  (ZETIA ) 10 MG tablet Take 1 tablet (10 mg total) by mouth daily. 90 tablet 1  FLUoxetine  (PROZAC ) 40 MG capsule Take 2 capsules (80 mg total) by mouth daily. 180 capsule 0   fluticasone  (FLONASE) 50 MCG/ACT nasal spray Place 2 sprays into both nostrils daily.       gabapentin  (NEURONTIN ) 100 MG capsule Take 1 capsule (100 mg total) by mouth 2 (two) times daily. 180 capsule 3   guaiFENesin  (MUCINEX ) 600 MG 12 hr tablet Take 600 mg by mouth daily.       metoprolol  tartrate (LOPRESSOR ) 25 MG tablet TAKE 1/2 TABLET TWICE DAILY 90 tablet 1   Multiple Vitamin (MULTIVITAMIN) tablet Take 1 tablet by mouth daily.       naproxen  sodium (ALEVE ) 220 MG tablet Take 220 mg by mouth daily.       ondansetron  (ZOFRAN -ODT) 4 MG disintegrating tablet Take 1 tablet (4 mg total) by mouth every 8 (eight) hours as needed for nausea or vomiting. 15 tablet 0   pantoprazole  (PROTONIX ) 40 MG tablet TAKE 1 TABLET 30 MINUTES PRIOR TO BREAKFAST 90 tablet 3   rosuvastatin  (CRESTOR ) 40 MG tablet Take 1 tablet (40 mg total) by mouth daily. 90 tablet 1   Simethicone  125 MG CAPS Take 125 mg by mouth daily as needed (gas).       vitamin E 180 MG (400 UNITS) capsule Take 400 Units by mouth daily.          No current facility-administered medications  for this visit.               Family History  Problem Relation Age of Onset   Hyperlipidemia Mother     Arthritis Mother     Diabetes Mother     Heart disease Mother     Colon polyps Mother     Cancer Paternal Grandmother          breast   Hypercholesterolemia Sister     Colon polyps Sister     Breast cancer Sister     Colon cancer Neg Hx     Esophageal cancer Neg Hx     Rectal cancer Neg Hx     Stomach cancer Neg Hx     Pancreatic cancer Neg Hx                  Physical Exam: Teeth in good repair Lungs: Clear  Card: RR with 3/6 sem Ext: No edema Neuro; alert without focal deficits         Diagnostic Studies & Laboratory data: I have personally reviewed the following studies and agree with the findings   TTE (09/2023) IMPRESSIONS     1. Left ventricular ejection fraction, by estimation, is 60 to 65%. The  left ventricle has normal function. The left ventricle has no regional  wall motion abnormalities. There is mild left ventricular hypertrophy.  Left ventricular diastolic parameters  are consistent with Grade I diastolic dysfunction (impaired relaxation).   2. Right ventricular systolic function is normal. The right ventricular  size is normal.   3. The mitral valve is degenerative. Mild mitral valve regurgitation.   4. AV mean gradient , PG , DVI 0.24, AVA 0.7cm2. The aortic  valve is calcified. Aortic valve regurgitation is mild. Severe aortic  valve stenosis.   5. The inferior vena cava is dilated in size with >50% respiratory  variability, suggesting right atrial pressure of 8 mmHg.   FINDINGS   Left Ventricle: Left ventricular ejection fraction, by estimation, is 60  to 65%. The left ventricle has normal function. The left ventricle  has no  regional wall motion abnormalities. Global longitudinal strain performed  but not reported based on  interpreter judgement due to suboptimal tracking. The left ventricular  internal cavity size was  normal in size. There is mild left ventricular  hypertrophy. Left ventricular diastolic parameters are consistent with  Grade I diastolic dysfunction (impaired  relaxation).   Right Ventricle: The right ventricular size is normal. No increase in  right ventricular wall thickness. Right ventricular systolic function is  normal.   Left Atrium: Left atrial size was normal in size.   Right Atrium: Right atrial size was normal in size.   Pericardium: There is no evidence of pericardial effusion.   Mitral Valve: The mitral valve is degenerative in appearance. Mild to  moderate mitral annular calcification. Mild mitral valve regurgitation.   Tricuspid Valve: The tricuspid valve is normal in structure. Tricuspid  valve regurgitation is not demonstrated.   Aortic Valve: AV mean gradient , PG , DVI 0.24, AVA 0.7cm2.  The aortic valve is calcified. Aortic valve regurgitation is mild. Severe  aortic stenosis is present. Aortic valve mean gradient measures 33.0 mmHg.  Aortic valve peak gradient  measures 55.5 mmHg. Aortic valve area, by VTI measures 0.66 cm.   Pulmonic Valve: The pulmonic valve was normal in structure. Pulmonic valve  regurgitation is not visualized.   Aorta: The aortic root and ascending aorta are structurally normal, with  no evidence of dilitation.   Venous: The inferior vena cava is dilated in size with greater than 50%  respiratory variability, suggesting right atrial pressure of 8 mmHg.   IAS/Shunts: No atrial level shunt detected by color flow Doppler.     LEFT VENTRICLE  PLAX 2D  LVOT diam:     1.90 cm   Diastology  LV SV:         63        LV e' medial:    5.98 cm/s  LV SV Index:   29        LV E/e' medial:  18.1  LVOT Area:     2.84 cm  LV e' lateral:   6.31 cm/s                           LV E/e' lateral: 17.1                             3D Volume EF:                           3D EF:        52 %                           LV EDV:       104  ml                           LV ESV:       50 ml                           LV SV:        54 ml   RIGHT VENTRICLE             IVC  RV S prime:  15.10 cm/s  IVC diam: 2.60 cm  TAPSE (M-mode): 3.1 cm   LEFT ATRIUM             Index  LA Vol (A2C):   74.5 ml 34.64 ml/m  LA Vol (A4C):   46.8 ml 21.76 ml/m  LA Biplane Vol: 59.0 ml 27.44 ml/m   AORTIC VALVE  AV Area (Vmax):    0.73 cm  AV Area (Vmean):   0.69 cm  AV Area (VTI):     0.66 cm  AV Vmax:           372.50 cm/s  AV Vmean:          266.000 cm/s  AV VTI:            0.955 m  AV Peak Grad:      55.5 mmHg  AV Mean Grad:      33.0 mmHg  LVOT Vmax:         95.85 cm/s  LVOT Vmean:        64.800 cm/s  LVOT VTI:          0.224 m  LVOT/AV VTI ratio: 0.23    AORTA  Ao Root diam: 3.00 cm  Ao Asc diam:  2.90 cm   MITRAL VALVE  MV Area (PHT): 3.08 cm     SHUNTS  MV Decel Time: 246 msec     Systemic VTI:  0.22 m  MV E velocity: 108.00 cm/s  Systemic Diam: 1.90 cm  MV A velocity: 140.00 cm/s  MV E/A ratio:  0.77    CATH (09/2023) Conclusion   1.  Mild nonobstructive coronary artery disease. 2.  Moderate to severe aortic stenosis with mean gradient of 32 mmHg and valve area of 1.1 cm. 3.  Right heart catheterization showed normal RA pressure, mildly elevated wedge pressure, minimal pulmonary hypertension and normal cardiac output.   Recommendations: Proceed with TAVR evaluation.    Recent Radiology Findings:   CTA (11/2023) FINDINGS: Image quality: Excellent.   Noise artifact is: Limited.   Valve Morphology: Tricuspid aortic valve with severely calcified leaflets that are diffusely calcified. Restricted leaflet motion in systole.   Aortic Valve Calcium  score: 1946   Aortic annular dimension:   Phase assessed: 35%   Annular area: 605 mm2   Annular perimeter: 88.9 mm   Max diameter: 30.4 mm   Min diameter: 26.9 mm   Annular and subannular calcification: Moderate, single protruding calcification under  the LCC.   Membranous septum length: 12.7 mm   Optimal coplanar projection: LAO 16 CRA 1   Coronary Artery Height above Annulus:   Left Main: 12.5 mm   Right Coronary: 20.5 mm   Sinus of Valsalva Measurements:   Non-coronary: 30 mm   Right-coronary: 30 mm   Left-coronary: 30 mm   Sinus of Valsalva Height:   Non-coronary: 21.2 mm   Right-coronary: 24.3 mm   Left-coronary: 21.0 mm   Sinotubular Junction: 27 mm   Ascending Thoracic Aorta: 29 mm   Coronary Arteries: Normal coronary origin. Right dominance. The study was performed without use of NTG and is insufficient for plaque evaluation. Please refer to recent cardiac catheterization for coronary assessment.   Right Atrium: Right atrial size is within normal limits.   Right Ventricle: The right ventricular cavity is within normal limits.   Left Atrium: Left atrial size is normal in size with no left atrial appendage filling defect.   Left Ventricle: The ventricular cavity size is within normal limits.  Pulmonary arteries: Normal in size without proximal filling defect.   Pulmonary veins: Normal pulmonary venous drainage.   Pericardium: Normal thickness with no significant effusion or calcium  present.   Mitral Valve: The mitral valve is normal structure without significant calcification.   Extra-cardiac findings: See attached radiology report for non-cardiac structures.   IMPRESSION: 1. Annular measurements support a 29 mm S3 or 34 mm Evolut Pro.   2. Moderate, single protruding calcification under the LCC.   3. Sufficient coronary to annulus distance.   4. Optimal Fluoroscopic Angle for Delivery: LAO 16 CRA 1     Recent Lab Findings: Recent Labs       Lab Results  Component Value Date    WBC 4.8 09/22/2023    HGB 10.5 (L) 10/04/2023    HCT 31.0 (L) 10/04/2023    PLT 153 09/22/2023    GLUCOSE 131 (H) 10/28/2023    CHOL 138 01/28/2023    TRIG 94.0 01/28/2023    HDL 62.40 01/28/2023     LDLDIRECT 209.2 05/28/2009    LDLCALC 57 01/28/2023    ALT 20 01/28/2023    AST 25 01/28/2023    NA 138 10/28/2023    K 4.1 10/28/2023    CL 106 10/28/2023    CREATININE 0.79 10/28/2023    BUN 11 10/28/2023    CO2 24 10/28/2023    TSH 1.33 01/28/2023    INR 1.0 11/11/2020    HGBA1C 6.4 01/28/2023            Assessment / Plan:     73 yo female with NYHA class 2-3 symptoms of severe AS with normal LV function and no CAD. She has a class I indication for AVR. Due to her age and comorbidities of DM, HTN, HLD, obesity, CKD II and COPD, I feel she would best be served with TAVR and agree with Dr Wendel that femoral access with 34 mm Evolute valve would be appropriate. She has increased risk of PPM due to RBBB. We discussed all the risks and goals and recovery from TAVR and she wishes to proceed. She is a bail out candidate

## 2023-12-21 ENCOUNTER — Encounter (HOSPITAL_COMMUNITY): Payer: Self-pay | Admitting: Internal Medicine

## 2023-12-21 ENCOUNTER — Encounter (HOSPITAL_COMMUNITY)
Admission: RE | Disposition: A | Discharge status: Home / Self Care | Payer: Self-pay | Source: Home / Self Care | Attending: Internal Medicine

## 2023-12-21 ENCOUNTER — Inpatient Hospital Stay (HOSPITAL_COMMUNITY): Payer: Self-pay | Admitting: Certified Registered Nurse Anesthetist

## 2023-12-21 ENCOUNTER — Inpatient Hospital Stay (HOSPITAL_COMMUNITY): Payer: Medicare Other

## 2023-12-21 ENCOUNTER — Inpatient Hospital Stay (HOSPITAL_COMMUNITY)
Admission: RE | Admit: 2023-12-21 | Discharge: 2023-12-22 | DRG: 266 | Disposition: A | Discharge status: Home / Self Care | Payer: Medicare Other | Attending: Internal Medicine | Admitting: Internal Medicine

## 2023-12-21 ENCOUNTER — Other Ambulatory Visit: Payer: Self-pay | Admitting: Physician Assistant

## 2023-12-21 DIAGNOSIS — G3184 Mild cognitive impairment, so stated: Secondary | ICD-10-CM | POA: Diagnosis present

## 2023-12-21 DIAGNOSIS — J4489 Other specified chronic obstructive pulmonary disease: Secondary | ICD-10-CM | POA: Diagnosis present

## 2023-12-21 DIAGNOSIS — E1143 Type 2 diabetes mellitus with diabetic autonomic (poly)neuropathy: Secondary | ICD-10-CM | POA: Diagnosis present

## 2023-12-21 DIAGNOSIS — E66812 Obesity, class 2: Secondary | ICD-10-CM | POA: Diagnosis present

## 2023-12-21 DIAGNOSIS — I272 Pulmonary hypertension, unspecified: Secondary | ICD-10-CM | POA: Diagnosis present

## 2023-12-21 DIAGNOSIS — I2510 Atherosclerotic heart disease of native coronary artery without angina pectoris: Secondary | ICD-10-CM | POA: Diagnosis present

## 2023-12-21 DIAGNOSIS — Z87891 Personal history of nicotine dependence: Secondary | ICD-10-CM

## 2023-12-21 DIAGNOSIS — K219 Gastro-esophageal reflux disease without esophagitis: Secondary | ICD-10-CM | POA: Diagnosis present

## 2023-12-21 DIAGNOSIS — E1122 Type 2 diabetes mellitus with diabetic chronic kidney disease: Secondary | ICD-10-CM | POA: Diagnosis present

## 2023-12-21 DIAGNOSIS — R011 Cardiac murmur, unspecified: Secondary | ICD-10-CM | POA: Diagnosis present

## 2023-12-21 DIAGNOSIS — N182 Chronic kidney disease, stage 2 (mild): Secondary | ICD-10-CM | POA: Diagnosis present

## 2023-12-21 DIAGNOSIS — Z6835 Body mass index (BMI) 35.0-35.9, adult: Secondary | ICD-10-CM

## 2023-12-21 DIAGNOSIS — E11649 Type 2 diabetes mellitus with hypoglycemia without coma: Secondary | ICD-10-CM | POA: Diagnosis not present

## 2023-12-21 DIAGNOSIS — M810 Age-related osteoporosis without current pathological fracture: Secondary | ICD-10-CM | POA: Diagnosis present

## 2023-12-21 DIAGNOSIS — Z803 Family history of malignant neoplasm of breast: Secondary | ICD-10-CM

## 2023-12-21 DIAGNOSIS — G4730 Sleep apnea, unspecified: Secondary | ICD-10-CM | POA: Diagnosis present

## 2023-12-21 DIAGNOSIS — Z91030 Bee allergy status: Secondary | ICD-10-CM

## 2023-12-21 DIAGNOSIS — F3341 Major depressive disorder, recurrent, in partial remission: Secondary | ICD-10-CM | POA: Diagnosis present

## 2023-12-21 DIAGNOSIS — I350 Nonrheumatic aortic (valve) stenosis: Secondary | ICD-10-CM

## 2023-12-21 DIAGNOSIS — E669 Obesity, unspecified: Secondary | ICD-10-CM | POA: Diagnosis present

## 2023-12-21 DIAGNOSIS — Z888 Allergy status to other drugs, medicaments and biological substances status: Secondary | ICD-10-CM

## 2023-12-21 DIAGNOSIS — I10 Essential (primary) hypertension: Secondary | ICD-10-CM

## 2023-12-21 DIAGNOSIS — I4510 Unspecified right bundle-branch block: Secondary | ICD-10-CM | POA: Diagnosis present

## 2023-12-21 DIAGNOSIS — Z8249 Family history of ischemic heart disease and other diseases of the circulatory system: Secondary | ICD-10-CM

## 2023-12-21 DIAGNOSIS — I452 Bifascicular block: Secondary | ICD-10-CM | POA: Diagnosis present

## 2023-12-21 DIAGNOSIS — Z96653 Presence of artificial knee joint, bilateral: Secondary | ICD-10-CM | POA: Diagnosis present

## 2023-12-21 DIAGNOSIS — Z83438 Family history of other disorder of lipoprotein metabolism and other lipidemia: Secondary | ICD-10-CM

## 2023-12-21 DIAGNOSIS — Z006 Encounter for examination for normal comparison and control in clinical research program: Secondary | ICD-10-CM | POA: Diagnosis not present

## 2023-12-21 DIAGNOSIS — Z79899 Other long term (current) drug therapy: Secondary | ICD-10-CM

## 2023-12-21 DIAGNOSIS — I130 Hypertensive heart and chronic kidney disease with heart failure and stage 1 through stage 4 chronic kidney disease, or unspecified chronic kidney disease: Secondary | ICD-10-CM | POA: Diagnosis present

## 2023-12-21 DIAGNOSIS — Z952 Presence of prosthetic heart valve: Secondary | ICD-10-CM

## 2023-12-21 DIAGNOSIS — J449 Chronic obstructive pulmonary disease, unspecified: Secondary | ICD-10-CM | POA: Diagnosis not present

## 2023-12-21 DIAGNOSIS — Z9889 Other specified postprocedural states: Secondary | ICD-10-CM

## 2023-12-21 DIAGNOSIS — Z9049 Acquired absence of other specified parts of digestive tract: Secondary | ICD-10-CM

## 2023-12-21 DIAGNOSIS — Z8719 Personal history of other diseases of the digestive system: Secondary | ICD-10-CM

## 2023-12-21 DIAGNOSIS — Z8261 Family history of arthritis: Secondary | ICD-10-CM

## 2023-12-21 DIAGNOSIS — I5033 Acute on chronic diastolic (congestive) heart failure: Secondary | ICD-10-CM | POA: Diagnosis not present

## 2023-12-21 DIAGNOSIS — Z87892 Personal history of anaphylaxis: Secondary | ICD-10-CM

## 2023-12-21 DIAGNOSIS — Z9181 History of falling: Secondary | ICD-10-CM

## 2023-12-21 DIAGNOSIS — E785 Hyperlipidemia, unspecified: Secondary | ICD-10-CM | POA: Diagnosis present

## 2023-12-21 DIAGNOSIS — I440 Atrioventricular block, first degree: Secondary | ICD-10-CM | POA: Diagnosis present

## 2023-12-21 DIAGNOSIS — Z981 Arthrodesis status: Secondary | ICD-10-CM

## 2023-12-21 DIAGNOSIS — Z833 Family history of diabetes mellitus: Secondary | ICD-10-CM

## 2023-12-21 DIAGNOSIS — Z90710 Acquired absence of both cervix and uterus: Secondary | ICD-10-CM

## 2023-12-21 DIAGNOSIS — Z83719 Family history of colon polyps, unspecified: Secondary | ICD-10-CM

## 2023-12-21 DIAGNOSIS — Z9089 Acquired absence of other organs: Secondary | ICD-10-CM

## 2023-12-21 DIAGNOSIS — Z7982 Long term (current) use of aspirin: Secondary | ICD-10-CM

## 2023-12-21 DIAGNOSIS — Z8781 Personal history of (healed) traumatic fracture: Secondary | ICD-10-CM

## 2023-12-21 HISTORY — DX: Presence of prosthetic heart valve: Z95.2

## 2023-12-21 HISTORY — PX: INTRAOPERATIVE TRANSTHORACIC ECHOCARDIOGRAM: SHX6523

## 2023-12-21 LAB — GLUCOSE, CAPILLARY
Glucose-Capillary: 131 mg/dL — ABNORMAL HIGH (ref 70–99)
Glucose-Capillary: 115 mg/dL — ABNORMAL HIGH (ref 70–99)
Glucose-Capillary: 55 mg/dL — ABNORMAL LOW (ref 70–99)
Glucose-Capillary: 79 mg/dL (ref 70–99)
Glucose-Capillary: 162 mg/dL — ABNORMAL HIGH (ref 70–99)
Glucose-Capillary: 126 mg/dL — ABNORMAL HIGH (ref 70–99)

## 2023-12-21 LAB — ECHOCARDIOGRAM LIMITED
AR max vel: 3.45 cm2
AV Area VTI: 3.11 cm2
AV Area mean vel: 3.56 cm2
AV Mean grad: 5 mm[Hg]
AV Peak grad: 10.2 mm[Hg]
Ao pk vel: 1.59 m/s
Calc EF: 62.6 %
Single Plane A2C EF: 62 %
Single Plane A4C EF: 62.8 %

## 2023-12-21 LAB — POCT I-STAT, CHEM 8
BUN: 6 mg/dL — ABNORMAL LOW (ref 8–23)
Calcium, Ion: 1.28 mmol/L (ref 1.15–1.40)
Chloride: 102 mmol/L (ref 98–111)
Creatinine, Ser: 0.8 mg/dL (ref 0.44–1.00)
Glucose, Bld: 148 mg/dL — ABNORMAL HIGH (ref 70–99)
HCT: 29 % — ABNORMAL LOW (ref 36.0–46.0)
Hemoglobin: 9.9 g/dL — ABNORMAL LOW (ref 12.0–15.0)
Potassium: 4.1 mmol/L (ref 3.5–5.1)
Sodium: 140 mmol/L (ref 135–145)
TCO2: 26 mmol/L (ref 22–32)

## 2023-12-21 SURGERY — TRANSCATHETER AORTIC VALVE REPLACEMENT, TRANSFEMORAL (CATHLAB)
Anesthesia: Monitor Anesthesia Care

## 2023-12-21 MED ORDER — PROTAMINE SULFATE 10 MG/ML IV SOLN
INTRAVENOUS | Status: DC | PRN
  Administered 2023-12-21: 150 mg via INTRAVENOUS

## 2023-12-21 MED ORDER — SODIUM CHLORIDE 0.9% FLUSH: 3.0000 mL | INTRAVENOUS | Status: DC | PRN

## 2023-12-21 MED ORDER — CYCLOBENZAPRINE HCL 10 MG PO TABS
10.0000 mg | ORAL_TABLET | Freq: Three times a day (TID) | ORAL | Status: DC | PRN
  Administered 2023-12-22: 10 mg via ORAL
  Filled 2023-12-21: qty 1

## 2023-12-21 MED ORDER — ASPIRIN 81 MG PO TBEC
81.0000 mg | DELAYED_RELEASE_TABLET | Freq: Every day | ORAL | Status: DC
  Administered 2023-12-22: 81 mg via ORAL
  Filled 2023-12-21: qty 1

## 2023-12-21 MED ORDER — LIDOCAINE HCL (PF) 1 % IJ SOLN
INTRAMUSCULAR | Status: DC | PRN
  Administered 2023-12-21: 12 mL
  Administered 2023-12-21: 2 mL
  Administered 2023-12-21: 12 mL

## 2023-12-21 MED ORDER — BUPROPION HCL ER (XL) 150 MG PO TB24
150.0000 mg | ORAL_TABLET | Freq: Every day | ORAL | Status: DC
  Administered 2023-12-21 – 2023-12-22 (×2): 150 mg via ORAL
  Filled 2023-12-21 (×2): qty 1

## 2023-12-21 MED ORDER — FUROSEMIDE 10 MG/ML IJ SOLN
40.0000 mg | Freq: Once | INTRAMUSCULAR | Status: AC
  Administered 2023-12-21: 40 mg via INTRAVENOUS
  Filled 2023-12-21: qty 4

## 2023-12-21 MED ORDER — CEFAZOLIN SODIUM-DEXTROSE 2-4 GM/100ML-% IV SOLN
2.0000 g | Freq: Three times a day (TID) | INTRAVENOUS | Status: AC
  Administered 2023-12-21 – 2023-12-22 (×2): 2 g via INTRAVENOUS
  Filled 2023-12-21 (×2): qty 100

## 2023-12-21 MED ORDER — FENTANYL CITRATE (PF) 100 MCG/2ML IJ SOLN
INTRAMUSCULAR | Status: AC
  Filled 2023-12-21: qty 2

## 2023-12-21 MED ORDER — CHLORHEXIDINE GLUCONATE 0.12 % MT SOLN
15.0000 mL | Freq: Once | OROMUCOSAL | Status: AC
  Administered 2023-12-21: 15 mL via OROMUCOSAL
  Filled 2023-12-21 (×2): qty 15

## 2023-12-21 MED ORDER — LACTATED RINGERS IV SOLN: INTRAVENOUS | Status: DC | PRN

## 2023-12-21 MED ORDER — PANTOPRAZOLE SODIUM 40 MG PO TBEC
40.0000 mg | DELAYED_RELEASE_TABLET | Freq: Every day | ORAL | Status: DC
  Administered 2023-12-21 – 2023-12-22 (×2): 40 mg via ORAL
  Filled 2023-12-21 (×2): qty 1

## 2023-12-21 MED ORDER — SODIUM CHLORIDE 0.9 % IV SOLN: INTRAVENOUS | Status: AC

## 2023-12-21 MED ORDER — SODIUM CHLORIDE 0.9% FLUSH
3.0000 mL | Freq: Two times a day (BID) | INTRAVENOUS | Status: DC
  Administered 2023-12-21 – 2023-12-22 (×2): 3 mL via INTRAVENOUS

## 2023-12-21 MED ORDER — FENTANYL CITRATE (PF) 100 MCG/2ML IJ SOLN
INTRAMUSCULAR | Status: DC | PRN
  Administered 2023-12-21: 50 ug via INTRAVENOUS
  Administered 2023-12-21 (×2): 25 ug via INTRAVENOUS

## 2023-12-21 MED ORDER — FLUOXETINE HCL 20 MG PO CAPS
40.0000 mg | ORAL_CAPSULE | Freq: Every day | ORAL | Status: DC
  Administered 2023-12-21 – 2023-12-22 (×2): 40 mg via ORAL
  Filled 2023-12-21 (×2): qty 2

## 2023-12-21 MED ORDER — INSULIN ASPART 100 UNIT/ML IJ SOLN: 0.0000 [IU] | INTRAMUSCULAR | Status: DC | PRN

## 2023-12-21 MED ORDER — GABAPENTIN 100 MG PO CAPS
100.0000 mg | ORAL_CAPSULE | Freq: Two times a day (BID) | ORAL | Status: DC
  Administered 2023-12-21 – 2023-12-22 (×2): 100 mg via ORAL
  Filled 2023-12-21 (×2): qty 1

## 2023-12-21 MED ORDER — HEPARIN (PORCINE) IN NACL 1000-0.9 UT/500ML-% IV SOLN
INTRAVENOUS | Status: DC | PRN
  Administered 2023-12-21 (×2): 500 mL

## 2023-12-21 MED ORDER — IODIXANOL 320 MG/ML IV SOLN
INTRAVENOUS | Status: DC | PRN
  Administered 2023-12-21: 75 mL via INTRA_ARTERIAL

## 2023-12-21 MED ORDER — OXYCODONE HCL 5 MG PO TABS: 5.0000 mg | ORAL_TABLET | ORAL | Status: DC | PRN

## 2023-12-21 MED ORDER — CLONAZEPAM 0.5 MG PO TABS: 0.5000 mg | ORAL_TABLET | Freq: Two times a day (BID) | ORAL | Status: DC | PRN

## 2023-12-21 MED ORDER — ACETAMINOPHEN 500 MG PO TABS
1000.0000 mg | ORAL_TABLET | Freq: Once | ORAL | Status: AC
  Administered 2023-12-21: 1000 mg via ORAL
  Filled 2023-12-21: qty 2

## 2023-12-21 MED ORDER — SODIUM CHLORIDE 0.9 % IV SOLN: INTRAVENOUS | Status: DC

## 2023-12-21 MED ORDER — TRAZODONE HCL 100 MG PO TABS: 100.0000 mg | ORAL_TABLET | Freq: Every evening | ORAL | Status: DC | PRN

## 2023-12-21 MED ORDER — SODIUM CHLORIDE 0.9 % IV SOLN
250.0000 mL | INTRAVENOUS | Status: DC | PRN
Start: 2023-12-21 — End: 2023-12-22

## 2023-12-21 MED ORDER — PROPOFOL 500 MG/50ML IV EMUL
INTRAVENOUS | Status: DC | PRN
  Administered 2023-12-21: 20 ug/kg/min via INTRAVENOUS

## 2023-12-21 MED ORDER — POTASSIUM CHLORIDE CRYS ER 20 MEQ PO TBCR
20.0000 meq | EXTENDED_RELEASE_TABLET | Freq: Once | ORAL | Status: AC
Start: 2023-12-21 — End: 2023-12-21
  Administered 2023-12-21: 20 meq via ORAL
  Filled 2023-12-21: qty 1

## 2023-12-21 MED ORDER — ACETAMINOPHEN 650 MG RE SUPP: 650.0000 mg | Freq: Four times a day (QID) | RECTAL | Status: DC | PRN

## 2023-12-21 MED ORDER — CHLORHEXIDINE GLUCONATE 4 % EX SOLN
30.0000 mL | CUTANEOUS | Status: DC
Start: 2023-12-21 — End: 2023-12-21

## 2023-12-21 MED ORDER — MORPHINE SULFATE (PF) 2 MG/ML IV SOLN: 1.0000 mg | INTRAVENOUS | Status: DC | PRN

## 2023-12-21 MED ORDER — NITROGLYCERIN IN D5W 200-5 MCG/ML-% IV SOLN: 0.0000 ug/min | INTRAVENOUS | Status: DC

## 2023-12-21 MED ORDER — ACETAMINOPHEN 325 MG PO TABS
650.0000 mg | ORAL_TABLET | Freq: Four times a day (QID) | ORAL | Status: DC | PRN
  Administered 2023-12-21 – 2023-12-22 (×2): 650 mg via ORAL
  Filled 2023-12-21 (×2): qty 2

## 2023-12-21 MED ORDER — DONEPEZIL HCL 5 MG PO TABS
5.0000 mg | ORAL_TABLET | Freq: Every day | ORAL | Status: DC
  Administered 2023-12-21: 5 mg via ORAL
  Filled 2023-12-21: qty 1

## 2023-12-21 MED ORDER — ONDANSETRON HCL 4 MG/2ML IJ SOLN
INTRAMUSCULAR | Status: DC | PRN
  Administered 2023-12-21: 4 mg via INTRAVENOUS

## 2023-12-21 MED ORDER — EZETIMIBE 10 MG PO TABS
10.0000 mg | ORAL_TABLET | Freq: Every day | ORAL | Status: DC
  Administered 2023-12-21 – 2023-12-22 (×2): 10 mg via ORAL
  Filled 2023-12-21 (×2): qty 1

## 2023-12-21 MED ORDER — ROSUVASTATIN CALCIUM 20 MG PO TABS
40.0000 mg | ORAL_TABLET | Freq: Every day | ORAL | Status: DC
  Administered 2023-12-21 – 2023-12-22 (×2): 40 mg via ORAL
  Filled 2023-12-21 (×2): qty 2

## 2023-12-21 MED ORDER — ONDANSETRON HCL 4 MG/2ML IJ SOLN: 4.0000 mg | Freq: Four times a day (QID) | INTRAMUSCULAR | Status: DC | PRN

## 2023-12-21 MED ORDER — LIDOCAINE HCL (PF) 1 % IJ SOLN
INTRAMUSCULAR | Status: AC
  Filled 2023-12-21: qty 30

## 2023-12-21 MED ORDER — CHLORHEXIDINE GLUCONATE 4 % EX SOLN
60.0000 mL | Freq: Once | CUTANEOUS | Status: DC
Start: 2023-12-21 — End: 2023-12-21

## 2023-12-21 MED ORDER — CHLORHEXIDINE GLUCONATE 4 % EX SOLN: 60.0000 mL | Freq: Once | CUTANEOUS | Status: DC

## 2023-12-21 MED ORDER — HEPARIN SODIUM (PORCINE) 1000 UNIT/ML IJ SOLN
INTRAMUSCULAR | Status: DC | PRN
  Administered 2023-12-21: 15000 [IU] via INTRAVENOUS

## 2023-12-21 MED ORDER — TRAMADOL HCL 50 MG PO TABS
50.0000 mg | ORAL_TABLET | ORAL | Status: DC | PRN
  Administered 2023-12-22: 50 mg via ORAL
  Filled 2023-12-21: qty 1

## 2023-12-21 MED ORDER — AMITRIPTYLINE HCL 50 MG PO TABS
25.0000 mg | ORAL_TABLET | Freq: Every day | ORAL | Status: DC
  Administered 2023-12-21: 25 mg via ORAL
  Filled 2023-12-21: qty 1

## 2023-12-21 SURGICAL SUPPLY — 34 items
BAG SNAP BAND KOVER 36X36 (MISCELLANEOUS) ×4 IMPLANT
BALLN TRUE 26X4.5 (BALLOONS) ×2 IMPLANT
BALLOON TRUE 26X4.5 (BALLOONS) IMPLANT
CABLE ADAPT PACING TEMP 12FT (ADAPTER) IMPLANT
CATH DIAG 6FR PIGTAIL ANGLED (CATHETERS) IMPLANT
CATH INFINITI 5 FR STR PIGTAIL (CATHETERS) IMPLANT
CATH INFINITI 6F AL1 (CATHETERS) IMPLANT
CLOSURE MYNX CONTROL 6F/7F (Vascular Products) IMPLANT
CLOSURE PERCLOSE PROSTYLE (VASCULAR PRODUCTS) IMPLANT
DILATOR VESSEL 38 20CM 11FR (INTRODUCER) IMPLANT
DILATOR VESSEL 38 20CM 16FR (INTRODUCER) IMPLANT
PACK CARDIAC CATHETERIZATION (CUSTOM PROCEDURE TRAY) ×2 IMPLANT
SET ATX-X65L (MISCELLANEOUS) IMPLANT
SHEATH DRYSEAL FLEX 22FR 33CM (SHEATH) IMPLANT
SHEATH PINNACLE 5F 10CM (SHEATH) IMPLANT
SHEATH PINNACLE 6F 10CM (SHEATH) IMPLANT
SHEATH PINNACLE 8F 10CM (SHEATH) IMPLANT
SHEATH PROBE COVER 6X72 (BAG) IMPLANT
SHIELD CATH-GARD CONTAMINATION (MISCELLANEOUS) IMPLANT
STOPCOCK MORSE 400PSI 3WAY (MISCELLANEOUS) ×4 IMPLANT
SYR CONTROL 10ML ANGIOGRAPHIC (SYRINGE) IMPLANT
SYS EVOLUT FX DELIVERY 34 (CATHETERS) ×2 IMPLANT
SYS EVOLUT FX LOADING 34 (CATHETERS) ×2 IMPLANT
SYSTEM EVOLUT FX DELIVERY 34 (CATHETERS) IMPLANT
SYSTEM EVOLUT FX LOADING 34 (CATHETERS) IMPLANT
TUBING CIL FLEX 10 FLL-RA (TUBING) IMPLANT
VALVE EVOLUT FX 34 (Valve) IMPLANT
WIRE AMPLATZ SS-J .035X260CM (WIRE) IMPLANT
WIRE EMERALD 3MM-J .035X150CM (WIRE) IMPLANT
WIRE EMERALD 3MM-J .035X260CM (WIRE) IMPLANT
WIRE EMERALD ST .035X260CM (WIRE) IMPLANT
WIRE MICRO SET SILHO 5FR 7 (SHEATH) IMPLANT
WIRE PACING TEMP ST TIP 5 (CATHETERS) IMPLANT
WIRE SAFARI SM CURVE 275 (WIRE) IMPLANT

## 2023-12-21 NOTE — Progress Notes (Signed)
 RN called report patient was hypoglycemic with glucose 50s, request insulin  SSI and glucsoe check. She is diet controlled pre-diabetes based on A1C, not on meds at baseline. Will order glucose check q2h for 3 occasions, if stable, OK to stop, call back if hypoglycemia or hyperglycemia occur.

## 2023-12-21 NOTE — Progress Notes (Signed)
  [facility]   MULTIDISCIPLINARY HEART VALVE TEAM  Patient doing well s/p TAVR. She is hemodynamically stable. Groin sites stable. ECG showed old RBBB and new mild 1st deg AV block but no high grade block. She already feels a weight off her chest. Given elevated LVEDP ~ 25 mmhg at the time of TAVR will treat with IV lasix  40mg  x 1 and Kdur x 1. Plan to transfer to from cath lab holding to 4E when bed available. Early ambulation after bedrest completed and hopeful discharge over the next 24-48 hours.   Lamarr Hummer PA-C  [facility]  Pager [phone]

## 2023-12-21 NOTE — Anesthesia Postprocedure Evaluation (Signed)
 Anesthesia Post Note  Patient: Amanda [name]  Procedure(s) Performed: Transcatheter Aortic Valve Replacement, Transfemoral (Left) INTRAOPERATIVE TRANSTHORACIC ECHOCARDIOGRAM     Patient location during evaluation: Cath Lab Anesthesia Type: MAC Level of consciousness: awake and alert Pain management: pain level controlled Vital Signs Assessment: post-procedure vital signs reviewed and stable Respiratory status: spontaneous breathing, nonlabored ventilation, respiratory function stable and patient connected to nasal cannula oxygen  Cardiovascular status: stable and blood pressure returned to baseline Postop Assessment: no apparent nausea or vomiting Anesthetic complications: no   There were no known notable events for this encounter.  Last Vitals:  Vitals:   12/21/23 1341 12/21/23 1345  BP: (!) 108/55 (!) 120/53  Pulse: (!) 58 (!) 59  Resp: 17 14  Temp:    SpO2: 97% 98%    Last Pain:  Vitals:   12/21/23 1332  TempSrc: Axillary  PainSc:                  Garnette FORBES Skillern

## 2023-12-21 NOTE — Anesthesia Procedure Notes (Signed)
 Procedure Name: MAC Date/Time: 12/21/2023 11:11 AM  Performed by: Claudene Florina Boga, CRNAPre-anesthesia Checklist: Patient identified, Emergency Drugs available, Suction available and Patient being monitored Patient Re-evaluated:Patient Re-evaluated prior to induction Oxygen  Delivery Method: Simple face mask Dental Injury: Teeth and Oropharynx as per pre-operative assessment

## 2023-12-21 NOTE — Discharge Summary (Addendum)
 [facility]   MULTIDISCIPLINARY HEART VALVE TEAM  Discharge Summary    Patient ID: Amanda [name] MRN: [id number]; DOB: Sep 16, 1951  Admit date: 12/21/2023 Discharge date: 12/22/2023  Primary Care Provider: Scherrie Curt, MD  Primary Cardiologist: Antionette Kirks, MD / Dr. Lorie Rook and Dr. Honey Lusty (TAVR)  Discharge Diagnoses    Principal Problem:   S/P TAVR (transcatheter aortic valve replacement) Active Problems:   Major depressive disorder, recurrent episode, in partial remission (HCC)   Essential hypertension   Gastroesophageal reflux disease   COPD with chronic bronchitis (HCC)   Severe aortic stenosis   CAD (coronary artery disease), native coronary artery   RBBB   Allergies Allergies  Allergen Reactions   Honey Bee Venom [Bee Venom] Anaphylaxis   Diazepam Other (See Comments)    REACTION: pt states she gets very angry and abusive verbally on this medication     Diagnostic Studies/Procedures    [facility]  TAVR OPERATIVE NOTE     Date of Procedure:                12/21/2023   Preoperative Diagnosis:      Severe Aortic Stenosis    Postoperative Diagnosis:    Same    Procedure:        Transcatheter Aortic Valve Replacement - Transfemoral Approach             Medtronic Evolut FX (size 34 mm, serial # [id number])               Co-Surgeons:                        Alyssa Backbone, MD and Melene Sportsman, MD   Anesthesiologist:                  Gwenevere Lent, MD   Echocardiographer:              Gloriann Larger, MD   Pre-operative Echo Findings: Severe aortic stenosis Normal left ventricular systolic function   Post-operative Echo Findings: Trivial paravalvular leak Normal left ventricular systolic function   Left Hearth Catheterization:             LVEDP            _____________    Echo 12/22/23: completed but pending formal read at the time of discharge   History of Present Illness     Amanda [name] is a  73 y.o. female with a history of RBBB, DM-diet controlled, HTN, HLD, morbid obesity (BMI 35), CKD stage II, CAD, COPD with previous tobacco abuse, mild cognitive impairment and severe aortic stenosis who presented to [facility] on 12/21/23 for planned TAVR.   She developed exertional shortness of breath and dizziness. Echo 09/20/23 showed EF 60% and severe AS with mean grad 33 mmHg, AVA 0.66 cm2, DVI 0.22 and mild MR & AI. L/RHC 10/04/23 showed mild nonobstructive coronary artery disease, normal RA pressure, mildly elevated wedge pressure, minimal pulmonary hypertension and normal cardiac output. Cardiac CT confirmed severe AS with an AoV calcium  score of 1946.  She was evaluated by the multidisciplinary valve team and felt to have severe, symptomatic aortic stenosis and to be a suitable candidate for TAVR, which was set up for 12/21/23.   Hospital Course     Consultants: none   Severe AS: s/p successful TAVR with a 34 mm Medtronic Evolut FX THV via the TF approach on 12/21/23. Post operative echo completed  but pending formal read. Groin sites are stable. Resumed on home baby Asprin 81mg  daily. Seen by cardiac rehab. Plan for discharge home today with close follow up in the outpatient setting.   Acute on chronic HFpEF: elevated LVEDP ~ 25 mmhg at the time of TAVR. Treated with IV lasix  40mg  x 1 and Kdur 20meq x 1. I do not think she will require standing diuretics at discharge but will follow in the outpatient setting.  RBBB: pt has a chronic RBBB. First pre op ECG showed a new 1st deg AV block which has now regressed. Will discharge with a Zio AT to rule out delayed HAVB.  HTN: BP well controlled. Resume home Lopressor  12.5 mg BID.  Elevated Raise Score: with RBBB in the setting of severe AS, she is at increased risk for amyloid heart disease. Will discuss screening in the outpatient setting.  _____________  Discharge Vitals Blood pressure 132/79, pulse 85, temperature 98.2 F (36.8 C), temperature  source Oral, resp. rate 17, height 5\' 7"  (1.702 m), weight 102.2 kg, SpO2 97%.  Filed Weights   12/21/23 0901 12/22/23 0348  Weight: 101.6 kg 102.2 kg    GEN: Well nourished, well developed, in no acute distress HEENT: normal Neck: no JVD or masses Cardiac: RRR; no murmurs, rubs, or gallops,no edema  Respiratory:  clear to auscultation bilaterally, normal work of breathing GI: soft, nontender, nondistended, + BS MS: no deformity or atrophy Skin: warm and dry, no rash.  Groin sites clear without hematoma or ecchymosis  Neuro:  Alert and Oriented x 3, Strength and sensation are intact Psych: euthymic mood, full affect  Disposition   Pt is being discharged home today in good condition.  Follow-up Plans & Appointments     Follow-up Information     Ardia Kraft, PA-C. Go on 12/31/2023.   Specialties: Cardiology, Radiology Why: @ 9:25am, please arrive at least 15 minutes early Contact information: [address] [place] [address] [phone]                Discharge Instructions     Amb Referral to Cardiac Rehabilitation   Complete by: As directed    Diagnosis: Valve Replacement   Valve: Aortic   After initial evaluation and assessments completed: Virtual Based Care may be provided alone or in conjunction with Phase 2 Cardiac Rehab based on patient barriers.: Yes   Intensive Cardiac Rehabilitation (ICR) [facility] location only OR Traditional Cardiac Rehabilitation (TCR) *If criteria for ICR are not met will enroll in TCR [facility] only): Yes       Discharge Medications   Allergies as of 12/22/2023       Reactions   Honey Bee Venom [bee Venom] Anaphylaxis   Diazepam Other (See Comments)   REACTION: pt states she gets very angry and abusive verbally on this medication        Medication List     TAKE these medications    acetaminophen  500 MG tablet Commonly known as: TYLENOL  Take 1,000 mg by mouth every 8 (eight) hours as needed for mild  pain (pain score 1-3) or moderate pain (pain score 4-6).   albuterol  108 (90 Base) MCG/ACT inhaler Commonly known as: VENTOLIN  HFA Inhale 2 puffs into the lungs every 4 (four) hours as needed for wheezing or shortness of breath.   amitriptyline  50 MG tablet Commonly known as: ELAVIL  Take 0.5 tablets (25 mg total) by mouth at bedtime.   aspirin  EC 81 MG tablet Take 81 mg  by mouth daily.   b complex vitamins capsule Take 1 capsule by mouth daily.   buPROPion  150 MG 24 hr tablet Commonly known as: WELLBUTRIN  XL Take 1 tablet (150 mg total) by mouth daily.   calcium  carbonate 500 MG chewable tablet Commonly known as: TUMS - dosed in mg elemental calcium  Chew 2 tablets by mouth daily as needed for indigestion or heartburn.   clonazePAM  0.5 MG tablet Commonly known as: KLONOPIN  Take 1 tablet (0.5 mg total) by mouth 2 (two) times daily as needed.   cyclobenzaprine  10 MG tablet Commonly known as: FLEXERIL  Take 1 tablet by mouth three times daily as needed for muscle spasm   dimenhyDRINATE 50 MG tablet Commonly known as: DRAMAMINE Take 50 mg by mouth every 8 (eight) hours as needed for nausea or dizziness.   donepezil  5 MG tablet Commonly known as: ARICEPT  TAKE 1 TABLET BY MOUTH AT BEDTIME   ezetimibe  10 MG tablet Commonly known as: ZETIA  Take 1 tablet (10 mg total) by mouth daily.   FLUoxetine  40 MG capsule Commonly known as: PROZAC  Take 2 capsules (80 mg total) by mouth daily. What changed:  how much to take additional instructions   fluticasone  50 MCG/ACT nasal spray Commonly known as: FLONASE Place 2 sprays into both nostrils daily as needed for rhinitis.   gabapentin  100 MG capsule Commonly known as: NEURONTIN  Take 1 capsule (100 mg total) by mouth 2 (two) times daily.   guaiFENesin  600 MG 12 hr tablet Commonly known as: MUCINEX  Take 600 mg by mouth 2 (two) times daily as needed for to loosen phlegm.   metoprolol  tartrate 25 MG tablet Commonly known as:  LOPRESSOR  TAKE 1/2 TABLET TWICE DAILY   multivitamin tablet Take 1 tablet by mouth daily.   naproxen  sodium 220 MG tablet Commonly known as: ALEVE  Take 220 mg by mouth in the morning.   ondansetron  4 MG disintegrating tablet Commonly known as: ZOFRAN -ODT Take 1 tablet (4 mg total) by mouth every 8 (eight) hours as needed for nausea or vomiting.   pantoprazole  40 MG tablet Commonly known as: PROTONIX  TAKE 1 TABLET 30 MINUTES PRIOR TO BREAKFAST   rosuvastatin  40 MG tablet Commonly known as: CRESTOR  Take 1 tablet (40 mg total) by mouth daily.   Simethicone  125 MG Caps Take 125 mg by mouth daily as needed (gas).   traZODone  100 MG tablet Commonly known as: DESYREL  Take 100 mg by mouth at bedtime as needed for sleep.   vitamin C  1000 MG tablet Take 1,000 mg by mouth daily.   Vitamin D  1000 units capsule Take 2,000 Units by mouth daily.   vitamin E 180 MG (400 UNITS) capsule Take 400 Units by mouth daily.         Outstanding Labs/Studies   none  Duration of Discharge Encounter   Greater than 30 minutes including physician time.  Signed, Abagail Hoar, PA-C 12/22/2023, 10:38 AM [phone]   ATTENDING ATTESTATION:  After conducting a review of all available clinical information with the care team, interviewing the patient, and performing a physical exam, I agree with the findings and plan described in this note.   GEN: No acute distress.   HEENT:  MMM, no JVD, no scleral icterus Cardiac: RRR, no murmurs, rubs, or gallops.  Respiratory: Clear to auscultation bilaterally. GI: Soft, nontender, non-distended  MS: No edema; No deformity. Neuro:  Nonfocal  Vasc:  +2 radial pulses; access sites intact  Patient doing well after TAVR with stable access sites, no evidence of  stroke, and no new conduction abnormalities with stable right bundle branch block.  Follow up echocardiogram with planned discharge and close hospital follow up.  Given transient  bifascicular block with first-degree AV block will place ZIO monitor on discharge.  Alyssa Backbone, MD Pager [phone]

## 2023-12-21 NOTE — Progress Notes (Signed)
 Pt admitted to rm 14 from cath lab. Bed rest is done per report. Initiated tele. Oriented pt to the unit. VSS. Call bell within reach.   Lawson Radar, RN

## 2023-12-21 NOTE — Progress Notes (Signed)
  Echocardiogram 2D Echocardiogram has been performed.  Amanda [name] 12/21/2023, 12:38 PM

## 2023-12-21 NOTE — Transfer of Care (Signed)
 Immediate Anesthesia Transfer of Care Note  Patient: Amanda [name]  Procedure(s) Performed: Transcatheter Aortic Valve Replacement, Transfemoral (Left) INTRAOPERATIVE TRANSTHORACIC ECHOCARDIOGRAM  Patient Location: Cath Lab  Anesthesia Type:MAC  Level of Consciousness: awake, alert , and oriented  Airway & Oxygen  Therapy: Patient Spontanous Breathing and Patient connected to face mask oxygen   Post-op Assessment: Report given to RN and Post -op Vital signs reviewed and stable  Post vital signs: Reviewed and stable  Last Vitals:  Vitals Value Taken Time  BP 114/57 12/21/23 1300  Temp    Pulse 60 12/21/23 1300  Resp 16 12/21/23 1300  SpO2 98 % 12/21/23 1300  Vitals shown include unfiled device data.  Last Pain:  Vitals:   12/21/23 1116  TempSrc:   PainSc: 0-No pain      Patients Stated Pain Goal: 0 (12/21/23 0931)  Complications: There were no known notable events for this encounter.

## 2023-12-21 NOTE — Op Note (Signed)
 [facility]   MULTIDISCIPLINARY HEART VALVE TEAM     TAVR OPERATIVE NOTE   NAME@ [id number]  Date of Procedure:                 12/21/2023   Preoperative Diagnosis:      Severe Aortic Stenosis    Postoperative Diagnosis:    Same    Procedure:        Transcatheter Aortic Valve Replacement - Percutaneous left Transfemoral Approach             Medtronic Evolut TFX  (size 34 mm, serial # [id number])              Co-Surgeons:            Deward Kallman, MD and Lurena Red , MD     Anesthesiologist:                  Garnette Skillern, MD   Echocardiographer:              Dr Primus,    Pre-operative Echo Findings: Severe aortic stenosis  Normal left ventricular systolic function   Post-operative Echo Findings: trivial paravalvular leak Normal left ventricular systolic function      BRIEF CLINICAL NOTE AND INDICATIONS FOR SURGERY   73 yo female with NYHA class 2-3 symptoms of severe AS with normal LV function and no CAD. She has a class I indication for AVR. Due to her age and comorbidities of DM, HTN, HLD, obesity, CKD II and COPD, I feel she would best be served with TAVR and agree with Dr Red that femoral access with 34 mm Evolute valve would be appropriate.         DETAILS OF THE OPERATIVE PROCEDURE    PREPARATION:    The patient was brought to the operating room on the above mentioned date and appropriate monitoring was established by the anesthesia team. The patient was placed in the supine position on the operating table.  Intravenous antibiotics were administered. The patient was monitored closely throughout the procedure under conscious sedation.   Baseline transthoracic echocardiogram was performed. The patient's abdomen and both groins were prepped and draped in a sterile manner. A time out procedure was performed.   PERIPHERAL ACCESS:    Using the modified Seldinger technique, femoral arterial and venous access was obtained with placement  of 6 Fr sheaths on the right side.  A pigtail diagnostic catheter was passed through the right arterial sheath under fluoroscopic guidance into the aortic root.  Right internal jugular access was also obtained and  temporary transvenous pacemaker catheter was passed through the IJ venous sheath under fluoroscopic guidance into the right ventricle.  The pacemaker was tested to ensure stable lead placement and pacemaker capture. Aortic root angiography was performed in order to determine the optimal angiographic angle for valve deployment.    TRANSFEMORAL ACCESS:    Percutaneous transfemoral access and sheath placement was performed using ultrasound guidance.  The left common femoral artery was cannulated using a micropuncture needle.  A pair of Abbott Perclose percutaneous closure devices were placed and a 6 French sheath replaced into the femoral artery.  The patient was heparinized systemically and ACT verified > 250 seconds.     A 21 Fr transfemoral Gore Dry-Seal sheath was introduced into the left femoral artery after progressively dilating over an Amplatz superstiff wire. An AL-1 catheter was used to direct a straight-tip exchange length wire across the native aortic  valve into the left ventricle. This was exchanged out for a pigtail catheter and position was confirmed in the LV apex. Simultaneous LV and Ao pressures were recorded.  The pigtail catheter was exchanged for a Safari wire in the LV apex.    BALLOON AORTIC VALVULOPLASTY:    This was performed using a 26mm true balloon under rapid ventricular pacing and the patient tolerated the procedure well.   TRANSCATHETER HEART VALVE DEPLOYMENT:    A Medtronic Evolut FX transcatheter heart valve (size 34 mm) was prepared and loaded into the delivery catheter system per manufacturer's guidelines and the proper orientation of the valve is confirmed under fluoroscopy. The delivery system and inline sheath were inserted into the right common femoral  artery over the [place] wire and the inline sheath advanced into the abdominal aorta under fluoroscopic guidance. The delivery catheter was advanced around the aortic arch and the valve was carefully positioned across the aortic valve annulus. An aortic root injection was performed to confirm position and the valve deployed using cusp overlap technique under fluoroscopic guidance. Intermittent pacing was used during valve deployment. The delivery system and guidewire were retracted into the descending aorta and the nosecone re-sheathed. Valve function was assessed using echocardiography. There is felt to be trivial paravalvular leak and no central aortic insufficiency. The patient's hemodynamic recovery following valve deployment is good.        PROCEDURE COMPLETION:    The delivery system and in-line sheath were removed and femoral artery closure performed using the Perclose devices.  Protamine  was administered once femoral arterial repair was complete. The temporary pacemaker, pigtail catheters and femoral sheaths were removed with manual pressure used venous for hemostasis and a Mynx closure device for contralateral arterial hemostasis.    The patient tolerated the procedure well and is transported to the cath lab recovery area in stable condition. There were no immediate intraoperative complications. All sponge instrument and needle counts are verified correct at completion of the operation.    No blood products were administered during the operation.   The patient received a total of 100 mL of intravenous contrast during the procedure.    Deward Kallman, MD

## 2023-12-21 NOTE — Interval H&P Note (Signed)
 History and Physical Interval Note:  12/21/2023 9:45 AM  Amanda [name]  has presented today for surgery, with the diagnosis of Severe Aortic Stenosis.  The various methods of treatment have been discussed with the patient and family. After consideration of risks, benefits and other options for treatment, the patient has consented to  Procedure(s): Transcatheter Aortic Valve Replacement, Transfemoral (Left) INTRAOPERATIVE TRANSTHORACIC ECHOCARDIOGRAM (N/A) as a surgical intervention.  The patient's history has been reviewed, patient examined, no change in status, stable for surgery.  I have reviewed the patient's chart and labs.  Questions were answered to the patient's satisfaction.     Deward Kallman

## 2023-12-21 NOTE — Discharge Instructions (Signed)
 ACTIVITY AND EXERCISE  Daily activity and exercise are an important part of your recovery. People recover at different rates depending on their general health and type of valve procedure.  Most people recovering from TAVR feel better relatively quickly   No lifting, pushing, pulling more than 10 pounds (examples to avoid: groceries, vacuuming, gardening, golfing):             - For one week with a procedure through the groin.             - For six weeks for procedures through the chest wall or neck. NOTE: You will typically see one of our providers 7-14 days after your procedure to discuss WHEN TO RESUME the above activities.      DRIVING  Do not drive until you are seen for follow up and cleared by a provider. Generally, we ask patient to not drive for 1 week after their procedure.  If you have been told by your doctor in the past that you may not drive, you must talk with him/her before you begin driving again.   DRESSING  Groin site: you may leave the clear dressing over the site for up to one week or until it falls off.   HYGIENE  If you had a femoral (leg) procedure, you may take a shower when you return home. After the shower, pat the site dry. Do NOT use powder, oils or lotions in your groin area until the site has completely healed.  If you had a chest procedure, you may shower when you return home unless specifically instructed not to by your discharging practitioner.             - DO NOT scrub incision; pat dry with a towel.             - DO NOT apply any lotions, oils, powders to the incision.             - No tub baths / swimming for at least 2 weeks.  If you notice any fevers, chills, increased pain, swelling, bleeding or pus, please contact your doctor.   ADDITIONAL INFORMATION  If you are going to have an upcoming dental procedure, please contact our office as you will require antibiotics ahead of time to prevent infection on your heart valve.    If you have any questions  or concerns you can call the structural heart phone during normal business hours 8am-4pm. If you have an urgent need after hours or weekends please call [phone] to talk to the on call provider for general cardiology. If you have an emergency that requires immediate attention, please call 911.    After TAVR Checklist  Check  Test Description   Follow up appointment in 1-2 weeks  You will see our structural heart advanced practice provider. Your incision sites will be checked and you will be cleared to drive and resume all normal activities if you are doing well.     1 month echo and follow up  You will have an echo to check on your new heart valve and be seen back in the office by a structural heart advanced practice provider.   Follow up with your primary cardiologist You will need to be seen by your primary cardiologist in the following 3-6 months after your 1 month appointment in the valve clinic.    1 year echo and follow up You will have another echo to check on your heart valve after 1 year  and be seen back in the office by a structural heart advanced practice provider. This your last structural heart visit.   Bacterial endocarditis prophylaxis  You will have to take antibiotics for the rest of your life before all dental procedures (even teeth cleanings) to protect your heart valve. Antibiotics are also required before some surgeries. Please check with your cardiologist before scheduling any surgeries. Also, please make sure to tell us  if you have a penicillin allergy as you will require an alternative antibiotic.

## 2023-12-21 NOTE — Anesthesia Preprocedure Evaluation (Addendum)
 Anesthesia Evaluation  Patient identified by MRN, date of birth, ID band Patient awake    Reviewed: Allergy & Precautions, NPO status , Patient's Chart, lab work & pertinent test results, reviewed documented beta blocker date and time   Airway Mallampati: II  TM Distance: >3 FB Neck ROM: Full    Dental  (+) Dental Advisory Given, Edentulous Upper, Edentulous Lower   Pulmonary sleep apnea , COPD, Patient abstained from smoking., former smoker   Pulmonary exam normal breath sounds clear to auscultation       Cardiovascular hypertension, Pt. on home beta blockers + CAD  + Valvular Problems/Murmurs AS, MR and AI  Rhythm:Regular Rate:Normal + Systolic murmurs Echo 09/20/23: 1. Left ventricular ejection fraction, by estimation, is 60 to 65%. The  left ventricle has normal function. The left ventricle has no regional  wall motion abnormalities. There is mild left ventricular hypertrophy.  Left ventricular diastolic parameters  are consistent with Grade I diastolic dysfunction (impaired relaxation).   2. Right ventricular systolic function is normal. The right ventricular  size is normal.   3. The mitral valve is degenerative. Mild mitral valve regurgitation.   4. AV mean gradient , PG , DVI 0.24, AVA 0.7cm2. The aortic  valve is calcified. Aortic valve regurgitation is mild. Severe aortic  valve stenosis.   5. The inferior vena cava is dilated in size with >50% respiratory  variability, suggesting right atrial pressure of 8 mmHg.     Neuro/Psych  PSYCHIATRIC DISORDERS Anxiety Depression    negative neurological ROS     GI/Hepatic Neg liver ROS, hiatal hernia,GERD  Medicated,,  Endo/Other  diabetes, Well Controlled, Type 2  Obesity   Renal/GU negative Renal ROS     Musculoskeletal negative musculoskeletal ROS (+)    Abdominal   Peds  Hematology negative hematology ROS (+)   Anesthesia Other Findings Day of  surgery medications reviewed with the patient.  Reproductive/Obstetrics                             Anesthesia Physical Anesthesia Plan  ASA: 4  Anesthesia Plan: MAC   Post-op Pain Management: Minimal or no pain anticipated   Induction: Intravenous  PONV Risk Score and Plan: 2 and TIVA  Airway Management Planned: Simple Face Mask and Natural Airway  Additional Equipment:   Intra-op Plan:   Post-operative Plan:   Informed Consent: I have reviewed the patients History and Physical, chart, labs and discussed the procedure including the risks, benefits and alternatives for the proposed anesthesia with the patient or authorized representative who has indicated his/her understanding and acceptance.     Dental advisory given  Plan Discussed with: CRNA  Anesthesia Plan Comments: (Arterial line off groin stick.)        Anesthesia Quick Evaluation

## 2023-12-21 NOTE — Op Note (Signed)
 HEART AND VASCULAR CENTER  TAVR OPERATIVE NOTE   Date of Procedure:  12/21/2023  Preoperative Diagnosis: Severe Aortic Stenosis   Postoperative Diagnosis: Same   Procedure:   Transcatheter Aortic Valve Replacement - Transfemoral Approach  Medtronic Evolut FX (size 34 mm, serial # [id number])    Co-Surgeons:  Lurena Red, MD and Deward Kallman, MD  Anesthesiologist:  Garnette Skillern, MD  Echocardiographer:  Stanly Leavens, MD  Pre-operative Echo Findings: Severe aortic stenosis Normal left ventricular systolic function  Post-operative Echo Findings: Trivial paravalvular leak Normal left ventricular systolic function  Left Hearth Catheterization:  LVEDP      BRIEF CLINICAL NOTE AND INDICATIONS FOR SURGERY  The patient is a 73 year old female with a history of type 2 diabetes, hypertension, hyperlipidemia, BMI of 38, CKD stage II, mild nonobstructive coronary artery disease, COPD, mild cognitive impairment, and severe symptomatic aortic stenosis who is referred for an elective transcatheter aortic valve replacement with a 34mm Evolut FX valve.  During the course of the patient's preoperative work up they have been evaluated comprehensively by a multidisciplinary team of specialists coordinated through the Multidisciplinary Heart Valve Clinic in the [facility].  They have been demonstrated to suffer from symptomatic severe aortic stenosis as noted above. The patient has been counseled extensively as to the relative risks and benefits of all options for the treatment of severe aortic stenosis including long term medical therapy, conventional surgery for aortic valve replacement, and transcatheter aortic valve replacement.  The patient has been independently evaluated by Dr. Whitman with CT surgery and they are felt to be at high risk for conventional surgical aortic valve replacement. The surgeon indicated the patient would be a poor candidate for  conventional surgery. Based upon review of all of the patient's preoperative diagnostic tests they are felt to be candidate for transcatheter aortic valve replacement using the transfemoral approach as an alternative to high risk conventional surgery.    Following the decision to proceed with transcatheter aortic valve replacement, a discussion has been held regarding what types of management strategies would be attempted intraoperatively in the event of life-threatening complications, including whether or not the patient would be considered a candidate for the use of cardiopulmonary bypass and/or conversion to open sternotomy for attempted surgical intervention.  The patient has been advised of a variety of complications that might develop peculiar to this approach including but not limited to risks of death, stroke, paravalvular leak, aortic dissection or other major vascular complications, aortic annulus rupture, device embolization, cardiac rupture or perforation, acute myocardial infarction, arrhythmia, heart block or bradycardia requiring permanent pacemaker placement, congestive heart failure, respiratory failure, renal failure, pneumonia, infection, other late complications related to structural valve deterioration or migration, or other complications that might ultimately cause a temporary or permanent loss of functional independence or other long term morbidity.  The patient provides full informed consent for the procedure as described and all questions were answered preoperatively.    DETAILS OF THE OPERATIVE PROCEDURE  PREPARATION:   The patient is brought to the operating room on the above mentioned date and central monitoring was established by the anesthesia team including placement of a radial arterial line. The patient is placed in the supine position on the operating table.  Intravenous antibiotics are administered. Conscious sedation is used.   Baseline transthoracic echocardiogram was  performed. The patient's chest, abdomen, both groins, and both lower extremities are prepared and draped in a sterile manner. A time out procedure is  performed.   PERIPHERAL ACCESS:   Using the modified Seldinger technique, femoral arterial and venous access were obtained with placement of a 6 Fr sheath in the right femoralartery and a 6 Fr sheath in the right femoralvein on the using u/s guidance.  A pigtail diagnostic catheter was passed through the femoral arterial sheath under fluoroscopic guidance into the aortic root.  A temporary transvenous pacemaker catheter was passed through the internal jugular venous sheath under fluoroscopic guidance into the right ventricle.  The pacemaker was tested to ensure stable lead placement and pacemaker capture. Aortic root angiography was performed in order to determine the optimal angiographic angle for valve deployment.  TRANSFEMORAL ACCESS:  A micropuncture kit was used to gain access to the left femoral artery using u/s guidance. Position confirmed with angiography. Pre-closure with double ProGlide closure devices. The patient was heparinized systemically and ACT verified > 250 seconds.    A 22 Fr transfemoral Dry Seal sheath was introduced into the left femoral artery over an Amplatz superstiff wire. An AL-1 catheter was used to direct a straight-tip exchange length wire across the native aortic valve into the left ventricle. This was exchanged out for a pigtail catheter and position was confirmed in the LV apex. Simultaneous LV and Ao pressures were recorded.    TRANSCATHETER HEART VALVE DEPLOYMENT:  A Medtronic Evolut FX THV size 34 mm was prepared and per manufacturer's guidelines, and the proper orientation of the valve is confirmed on the delivery system.  The valve was advanced through the introducer sheath into the descending aorta. The valve was then advanced across the aortic arch. The valve was carefully positioned across the aortic valve annulus.  Once final position of the valve has been confirmed with angiographic assessment in the cusp overlap view and in the LAO view, the valve is deployed with controlled rapid pacing. The valve is taken to 80% deployment and appropriate depth is confirmed. The valve is then released from each paddle. There is no valvular leak and no central aortic insufficiency.  The patient's hemodynamic recovery following valve deployment is good.  Echo demostrated acceptable post-procedural gradients, stable mitral valve function, and no AI.   PROCEDURE COMPLETION:  The sheath was then removed and closure devices were completed. Protamine  was administered once femoral arterial repair was complete. The temporary pacemaker, pigtail catheters and femoral sheaths were removed with a Mynx closure device placed in the artery and manual pressure used for venous hemostasis.    The patient tolerated the procedure well and is transported to the surgical intensive care in stable condition. There were no immediate intraoperative complications. All sponge instrument and needle counts are verified correct at completion of the operation.   No blood products were administered during the operation.  The patient received a total of  75 mL of intravenous contrast during the procedure.  Arsen Mangione K Novelle Addair MD 12/21/2023 12:59 PM

## 2023-12-21 NOTE — Progress Notes (Signed)
 Hypoglycemic Event  CBG: 55  Treatment: 8 oz juice/soda  Symptoms: Shaky  Follow-up CBG: Time:1820 CBG Result:79  Possible Reasons for Event: Inadequate meal intake  Lawson Radar

## 2023-12-22 ENCOUNTER — Inpatient Hospital Stay (HOSPITAL_COMMUNITY)
Admit: 2023-12-22 | Discharge: 2023-12-22 | Disposition: A | Discharge status: Home / Self Care | Payer: Medicare Other | Attending: Physician Assistant

## 2023-12-22 ENCOUNTER — Other Ambulatory Visit: Payer: Self-pay

## 2023-12-22 ENCOUNTER — Encounter (HOSPITAL_COMMUNITY): Payer: Self-pay | Admitting: Internal Medicine

## 2023-12-22 ENCOUNTER — Inpatient Hospital Stay (HOSPITAL_COMMUNITY): Payer: Medicare Other

## 2023-12-22 DIAGNOSIS — Z952 Presence of prosthetic heart valve: Secondary | ICD-10-CM

## 2023-12-22 DIAGNOSIS — I4510 Unspecified right bundle-branch block: Secondary | ICD-10-CM

## 2023-12-22 LAB — BASIC METABOLIC PANEL
Anion gap: 8 (ref 5–15)
BUN: 6 mg/dL — ABNORMAL LOW (ref 8–23)
CO2: 27 mmol/L (ref 22–32)
Calcium: 8.8 mg/dL — ABNORMAL LOW (ref 8.9–10.3)
Chloride: 105 mmol/L (ref 98–111)
Creatinine, Ser: 0.83 mg/dL (ref 0.44–1.00)
GFR, Estimated: >60 mL/min (ref >60)
Glucose, Bld: 114 mg/dL — ABNORMAL HIGH (ref 70–99)
Potassium: 4 mmol/L (ref 3.5–5.1)
Sodium: 140 mmol/L (ref 135–145)

## 2023-12-22 LAB — ECHOCARDIOGRAM COMPLETE
AR max vel: 2.65 cm2
AV Area VTI: 3.1 cm2
AV Area mean vel: 3.19 cm2
AV Mean grad: 8 mm[Hg]
AV Peak grad: 14.3 mm[Hg]
Ao pk vel: 1.89 m/s
Area-P 1/2: 2.21 cm2
Height: 67 in
MV VTI: 2.89 cm2
S' Lateral: 2 cm
Weight: 3604.96 [oz_av]

## 2023-12-22 LAB — CBC
HCT: 36.4 % (ref 36.0–46.0)
Hemoglobin: 11.9 g/dL — ABNORMAL LOW (ref 12.0–15.0)
MCH: 33.1 pg (ref 26.0–34.0)
MCHC: 32.7 g/dL (ref 30.0–36.0)
MCV: 101.4 fL — ABNORMAL HIGH (ref 80.0–100.0)
Platelets: 116 10*3/uL — ABNORMAL LOW (ref 150–400)
RBC: 3.59 MIL/uL — ABNORMAL LOW (ref 3.87–5.11)
RDW: 11.3 % — ABNORMAL LOW (ref 11.5–15.5)
WBC: 6.5 10*3/uL (ref 4.0–10.5)
nRBC: 0 % (ref 0.0–0.2)

## 2023-12-22 LAB — MAGNESIUM: Magnesium: 2 mg/dL (ref 1.7–2.4)

## 2023-12-22 LAB — GLUCOSE, CAPILLARY
Glucose-Capillary: 104 mg/dL — ABNORMAL HIGH (ref 70–99)
Glucose-Capillary: 68 mg/dL — ABNORMAL LOW (ref 70–99)

## 2023-12-22 NOTE — Progress Notes (Signed)
 ZIO AT applied at hospital. Dr. Iven Mark to read.

## 2023-12-22 NOTE — Progress Notes (Signed)
 Discharge instructions reviewed with pt and her daughter.  Copy of instructions given to pt. No new scripts. No questions at this time.  Pt will be d/c'd via wheelchair with belongings, with daughter and will be          escorted by hospital volunteer.   Aliyyah Riese,RN SWOT

## 2023-12-22 NOTE — Progress Notes (Signed)
 CARDIAC REHAB PHASE I     Post TAVR education including site care, restrictions, risk factors, heart healthy diabetic diet, exercise guidelines and CRP2 reviewed. All questions and concerns addressed. Will refer to [facility] for CRP2. Plan for home later today.   [id number] Ronny Colas, RN BSN 12/22/2023 9:01 AM

## 2023-12-22 NOTE — Progress Notes (Signed)
 Mobility Specialist Progress Note:   12/22/23 1031  Mobility  Activity Ambulated with assistance in hallway  Level of Assistance Minimal assist, patient does 75% or more  Assistive Device Front wheel walker  Distance Ambulated (ft) 250 ft  Activity Response Tolerated well  Mobility Referral Yes  Mobility visit 1 Mobility  Mobility Specialist Start Time (ACUTE ONLY) 1000  Mobility Specialist Stop Time (ACUTE ONLY) 1015  Mobility Specialist Time Calculation (min) (ACUTE ONLY) 15 min   Pt received in bed, agreeable to mobility session. Ambulated in hallway, required MinA to stand via RW. Tolerated well, requested to use BSC, void successful. Returned pt to room, left with all needs met, call bell in reach.  Danyella Mcginty Mobility Specialist Please contact via Special educational needs teacher or  Rehab office at [phone]

## 2023-12-22 NOTE — Progress Notes (Signed)
 Echocardiogram 2D Echocardiogram has been performed.  Emmaline Haring Marlow Hendrie RDCS 12/22/2023, 8:59 AM

## 2023-12-22 NOTE — Plan of Care (Signed)

## 2023-12-23 ENCOUNTER — Telehealth: Payer: Self-pay

## 2023-12-23 ENCOUNTER — Telehealth: Payer: Self-pay | Admitting: Physician Assistant

## 2023-12-23 DIAGNOSIS — I4510 Unspecified right bundle-branch block: Secondary | ICD-10-CM | POA: Diagnosis not present

## 2023-12-23 DIAGNOSIS — Z952 Presence of prosthetic heart valve: Secondary | ICD-10-CM | POA: Diagnosis not present

## 2023-12-23 NOTE — Telephone Encounter (Signed)
  HEART AND VASCULAR CENTER   MULTIDISCIPLINARY HEART VALVE TEAM   Attempted TOC call. Left VM to call back.   Wilbon Obenchain PA-C  [facility]     

## 2023-12-23 NOTE — Telephone Encounter (Signed)
  HEART AND VASCULAR CENTER   MULTIDISCIPLINARY HEART VALVE TEAM   Patient contacted regarding discharge from [facility] on 12/22/23. Spoke to pt's son Ethelene Browns.  Patient understands to follow up with a structural heart APP on 1/24 at [facility].  Patient understands discharge instructions? yes Patient understands medications and regimen? yes Patient understands to bring all medications to this visit? yes  Cline Crock PA-C  [facility]

## 2023-12-23 NOTE — Transitions of Care (Post Inpatient/ED Visit) (Signed)
   12/23/2023  Name: JENSON KILIC MRN: [id number] DOB: 10-Mar-1951  Today's TOC FU Call Status: Today's TOC FU Call Status:: Unsuccessful Call (1st Attempt) Unsuccessful Call (1st Attempt) Date: 12/23/23  Attempted to reach the patient regarding the most recent Inpatient/ED visit.  Follow Up Plan: Additional outreach attempts will be made to reach the patient to complete the Transitions of Care (Post Inpatient/ED visit) call.   Deidre Ala, RN Medical illustrator [facility] [phone]

## 2023-12-24 ENCOUNTER — Telehealth: Payer: Self-pay

## 2023-12-24 NOTE — Transitions of Care (Post Inpatient/ED Visit) (Signed)
   12/24/2023  Name: [name] MRN: [id number] DOB: 1950-12-16  Today's TOC FU Call Status: Today's TOC FU Call Status:: Unsuccessful Call (2nd Attempt) Unsuccessful Call (2nd Attempt) Date: 12/24/23  Attempted to reach the patient regarding the most recent Inpatient/ED visit.  Follow Up Plan: Additional outreach attempts will be made to reach the patient to complete the Transitions of Care (Post Inpatient/ED visit) call.   Jodelle Gross RN, BSN, CCM RN Care Manager  Transitions of Care  [facility]  [phone]

## 2023-12-27 ENCOUNTER — Telehealth: Payer: Self-pay

## 2023-12-27 NOTE — Transitions of Care (Post Inpatient/ED Visit) (Signed)
   12/27/2023  Name: CLAUDIE PAJARES MRN: [id number] DOB: Feb 09, 1951  Today's TOC FU Call Status: Today's TOC FU Call Status:: Unsuccessful Call (3rd Attempt) Unsuccessful Call (1st Attempt) Date: 12/23/23 Unsuccessful Call (2nd Attempt) Date: 12/24/23 Unsuccessful Call (3rd Attempt) Date: 12/27/23  Attempted to reach the patient regarding the most recent Inpatient/ED visit.  Follow Up Plan: No further outreach attempts will be made at this time. We have been unable to contact the patient.  Deidre Ala, RN Medical illustrator [facility] [phone]

## 2023-12-30 NOTE — Progress Notes (Addendum)
HEART AND VASCULAR CENTER   MULTIDISCIPLINARY HEART VALVE CLINIC                                     Cardiology Office Note:    Date:  01/01/2024   ID:  Amanda [name], DOB Oct 01, 1951, MRN [id number]  PCP:  Hannah Beat, MD  [facility] Cardiologist:  Lorine Bears, MD  [facility] Electrophysiologist:  None   Referring MD: Hannah Beat, MD   [facility] s/p TAVR  History of Present Illness:    Amanda [name] is a 73 y.o. female with a hx of RBBB, DM-diet controlled, HTN, HLD, morbid obesity (BMI 35), CKD stage II, CAD, COPD with previous tobacco abuse, mild cognitive impairment and severe aortic stenosis s/p TAVR (12/21/23) who presents to clinic for follow up.  She developed exertional shortness of breath and dizziness. Echo 09/20/23 showed EF 60% and severe AS with mean grad 33 mmHg, AVA 0.66 cm2, DVI 0.22 and mild MR & AI. L/RHC 10/04/23 showed mild nonobstructive coronary artery disease, normal RA pressure, mildly elevated wedge pressure, minimal pulmonary hypertension and normal cardiac output. Cardiac CT confirmed severe AS with an AoV calcium score of 1946. S/p TAVR with a 34 mm Medtronic Evolut FX THV via the TF approach on 12/21/23. Post operative echo showed EF 70%, normally functioning TAVR with a mean gradient of 8 mmHg and no PVL. She was treated with one dose of IV lasix given elevated LVEDP at the time of TAVR. Discharged on a baby aspirin 81mg  daily. With chronic RBBB and increased risk for HAVB, she was discharged home with a Zio AT.   Today the patient presents to clinic for follow up. Here today with daughter. Overall doing well. Has noticed some left leg weakness but no exertional pain or numbness/tingling. She has neuropathy in both feet. No CP or SOB. No LE edema, orthopnea or PND. No dizziness or syncope. No blood in stool or urine. No palpitations.    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  2013: nl LV sys fxn, mild MR, no evidence of pulm htn; b. TTE 8/17: EF 60-65%, no RWMA, nl LV dia fxn,  mild AS, mod AI   CAD (coronary artery disease), native coronary artery 11/26/2020   Controlled type 2 diabetes mellitus with diabetic autonomic neuropathy, without long-term current use of insulin (HCC) 08/10/2007   Qualifier: Diagnosis of  By: Hetty Ely MD, Franne Grip    COPD (chronic obstructive pulmonary disease) (HCC) 10/30/2016   Depression    Fracture in accidental fall    GAD (generalized anxiety disorder)    GERD (gastroesophageal reflux disease)    Hiatal hernia with gastroesophageal reflux 1997   Hyperlipidemia    Hypertension    Osteoporosis    S/P TAVR (transcatheter aortic valve replacement) 12/21/2023   s/p TAVR with a 34 mm Evolut FX via the TF approach by Dr. Lynnette Caffey & Dr. Leafy Ro   Tobacco abuse      Current Medications: Current Meds  Medication Sig   acetaminophen (TYLENOL) 500 MG tablet Take 1,000 mg by mouth every 8 (eight) hours as needed for mild pain (pain score 1-3) or moderate pain (pain score 4-6).   albuterol (VENTOLIN HFA) 108 (90 Base) MCG/ACT inhaler Inhale 2 puffs into the lungs every 4 (four) hours as  needed for wheezing or shortness of breath.   amitriptyline (ELAVIL) 50 MG tablet Take 0.5 tablets (25 mg total) by mouth at bedtime.   Ascorbic Acid (VITAMIN C) 1000 MG tablet Take 1,000 mg by mouth daily.   aspirin EC 81 MG tablet Take 81 mg by mouth daily.   b complex vitamins capsule Take 1 capsule by mouth daily.   buPROPion (WELLBUTRIN XL) 150 MG 24 hr tablet Take 1 tablet (150 mg total) by mouth daily.   calcium carbonate (TUMS - DOSED IN MG ELEMENTAL CALCIUM) 500 MG chewable tablet Chew 2 tablets by mouth daily as needed for indigestion or heartburn.   Cholecalciferol (VITAMIN D) 1000 UNITS capsule Take 2,000 Units by mouth daily.   clonazePAM (KLONOPIN) 0.5 MG tablet Take 1 tablet (0.5 mg total) by mouth 2 (two) times daily as needed.    cyclobenzaprine (FLEXERIL) 10 MG tablet Take 1 tablet by mouth three times daily as needed for muscle spasm   dimenhyDRINATE (DRAMAMINE) 50 MG tablet Take 50 mg by mouth every 8 (eight) hours as needed for nausea or dizziness.   donepezil (ARICEPT) 5 MG tablet TAKE 1 TABLET BY MOUTH AT BEDTIME   ezetimibe (ZETIA) 10 MG tablet Take 1 tablet (10 mg total) by mouth daily.   FLUoxetine (PROZAC) 40 MG capsule Take 2 capsules (80 mg total) by mouth daily. (Patient taking differently: Take 40 mg by mouth daily. Additional 40 mg of needed)   fluticasone (FLONASE) 50 MCG/ACT nasal spray Place 2 sprays into both nostrils daily as needed for rhinitis.   gabapentin (NEURONTIN) 100 MG capsule Take 1 capsule (100 mg total) by mouth 2 (two) times daily.   guaiFENesin (MUCINEX) 600 MG 12 hr tablet Take 600 mg by mouth 2 (two) times daily as needed for to loosen phlegm.   metoprolol tartrate (LOPRESSOR) 25 MG tablet TAKE 1/2 TABLET TWICE DAILY   Multiple Vitamin (MULTIVITAMIN) tablet Take 1 tablet by mouth daily.   naproxen sodium (ALEVE) 220 MG tablet Take 220 mg by mouth in the morning.   ondansetron (ZOFRAN-ODT) 4 MG disintegrating tablet Take 1 tablet (4 mg total) by mouth every 8 (eight) hours as needed for nausea or vomiting.   pantoprazole (PROTONIX) 40 MG tablet TAKE 1 TABLET 30 MINUTES PRIOR TO BREAKFAST   rosuvastatin (CRESTOR) 40 MG tablet Take 1 tablet (40 mg total) by mouth daily.   Simethicone 125 MG CAPS Take 125 mg by mouth daily as needed (gas).   traZODone (DESYREL) 100 MG tablet Take 100 mg by mouth at bedtime as needed for sleep.   vitamin E 180 MG (400 UNITS) capsule Take 400 Units by mouth daily.      ROS:   Please see the history of present illness.    All other systems reviewed and are negative.  EKGs   EKG Interpretation Date/Time:  Friday December 31 2023 09:40:15 EST Ventricular Rate:  71 PR Interval:  190 QRS Duration:  148 QT Interval:  418 QTC Calculation: 454 R  Axis:   -2  Text Interpretation: Normal sinus rhythm Right bundle branch block Nonspecific T wave abnormality Confirmed by Cline Crock [phone]) on 12/31/2023 9:47:22 AM   Risk Assessment/Calculations:           Physical Exam:    VS:  BP 108/60   Pulse 71   Ht 5\' 7"  (1.702 m)   Wt 231 lb 9.6 oz (105.1 kg)   SpO2 97%   BMI 36.27 kg/m     Wt  Readings from Last 3 Encounters:  12/31/23 231 lb 9.6 oz (105.1 kg)  12/22/23 225 lb 5 oz (102.2 kg)  12/09/23 224 lb (101.6 kg)     GEN: Well nourished, well developed in no acute distress NECK: No JVD CARDIAC: RRR, no murmurs, rubs, gallops RESPIRATORY:  Clear to auscultation without rales, wheezing or rhonchi  ABDOMEN: Soft, non-tender, non-distended EXTREMITIES:  No edema; No deformity.  Groin sites clear without hematoma or ecchymosis.  ASSESSMENT:    1. S/P TAVR (transcatheter aortic valve replacement)   2. Chronic heart failure with preserved ejection fraction (HCC)   3. RBBB   4. Essential hypertension   5. Amyloid heart disease (HCC)   6. Left leg weakness     PLAN:    In order of problems listed above:  Severe AS s/p TAVR: pt doing well s/p TAVR. Groin sites healing well. SBE prophylaxis discussed; the patient is edentulous and does not go to the dentist. Continue a baby Asprin 81mg  daily. I will see back for 1 month echo and OV.  Chronic HFpEF: appears euvolemic off diuretics.   RBBB: no high risk alerts on Zio AT.    HTN: BP well controlled. Continue Lopressor 12.5 mg BID.   Elevated Raise Score: with RBBB in the setting of severe AS and neuropathy she is at increased risk for amyloid heart disease. Discussed with pt and she would like to proceed with amyloid labs and PYP.  Left leg weakness: this has been worse since TAVR. Her leg feels well perfused with equal pulses. There is no pain/numbness. I offered to get arterial dopplers since that was the side we deployed the valve but she said it is improving and  she would like to watch it.      Cardiac Rehabilitation Eligibility Assessment  The patient is ready to start cardiac rehabilitation from a cardiac standpoint.   Medication Adjustments/Labs and Tests Ordered: Current medicines are reviewed at length with the patient today.  Concerns regarding medicines are outlined above.  Orders Placed This Encounter  Procedures   SPEP   UPEP   PYP Scan   EKG 12-Lead   No orders of the defined types were placed in this encounter.   Patient Instructions  Medication Instructions:  Your physician recommends that you continue on your current medications as directed. Please refer to the Current Medication list given to you today.  *If you need a refill on your cardiac medications before your next appointment, please call your pharmacy*   Lab Work: Please complete your lab work (SPEP, UPEP) at the [facility] on the first floor of our building before you leave today.   If you have labs (blood work) drawn today and your tests are completely normal, you will receive your results only by: MyChart Message (if you have MyChart) OR A paper copy in the mail If you have any lab test that is abnormal or we need to change your treatment, we will call you to review the results.   Testing/Procedures: Cline Crock, PA-C has ordered a PYP scan for you. Once this imaging is cleared by insurance, someone will call you to set up an appointment.  Your physician has requested that you have an echocardiogram on 01/21/2024 at 8:00 AM. Echocardiography is a painless test that uses sound waves to create images of your heart. It provides your doctor with information about the size and shape of your heart and how well your heart's chambers and valves are working. This procedure takes approximately  one hour. There are no restrictions for this procedure. Please do NOT wear cologne, perfume, aftershave, or lotions (deodorant is allowed). Please arrive 15 minutes prior to your  appointment time.  Please note: We ask at that you not bring children with you during ultrasound (echo/ vascular) testing. Due to room size and safety concerns, children are not allowed in the ultrasound rooms during exams. Our front office staff cannot provide observation of children in our lobby area while testing is being conducted. An adult accompanying a patient to their appointment will only be allowed in the ultrasound room at the discretion of the ultrasound technician under special circumstances. We apologize for any inconvenience.    Follow-Up: At [facility], you and your health needs are our priority.  As part of our continuing mission to provide you with exceptional heart care, we have created designated Provider Care Teams.  These Care Teams include your primary Cardiologist (physician) and Advanced Practice Providers (APPs -  Physician Assistants and Nurse Practitioners) who all work together to provide you with the care you need, when you need it.  We recommend signing up for the patient portal called "MyChart".  Sign up information is provided on this After Visit Summary.  MyChart is used to connect with patients for Virtual Visits (Telemedicine).  Patients are able to view lab/test results, encounter notes, upcoming appointments, etc.  Non-urgent messages can be sent to your provider as well.   To learn more about what you can do with MyChart, go to [email or web address].    Your next appointment:   01/21/24  at 9:25 AM  Provider:   Cline Crock, PA-C          Signed, Cline Crock, PA-C  01/01/2024 4:30 AM    [facility]

## 2023-12-31 ENCOUNTER — Ambulatory Visit: Payer: Medicare Other | Attending: Physician Assistant

## 2023-12-31 ENCOUNTER — Telehealth: Payer: Self-pay

## 2023-12-31 VITALS — BP 108/60 | HR 71 | Ht 67.0 in | Wt 231.6 lb

## 2023-12-31 DIAGNOSIS — I4510 Unspecified right bundle-branch block: Secondary | ICD-10-CM | POA: Diagnosis not present

## 2023-12-31 DIAGNOSIS — I5032 Chronic diastolic (congestive) heart failure: Secondary | ICD-10-CM

## 2023-12-31 DIAGNOSIS — Z952 Presence of prosthetic heart valve: Secondary | ICD-10-CM

## 2023-12-31 DIAGNOSIS — E854 Organ-limited amyloidosis: Secondary | ICD-10-CM

## 2023-12-31 DIAGNOSIS — I43 Cardiomyopathy in diseases classified elsewhere: Secondary | ICD-10-CM | POA: Diagnosis not present

## 2023-12-31 DIAGNOSIS — I10 Essential (primary) hypertension: Secondary | ICD-10-CM | POA: Diagnosis not present

## 2023-12-31 DIAGNOSIS — R29898 Other symptoms and signs involving the musculoskeletal system: Secondary | ICD-10-CM | POA: Diagnosis not present

## 2023-12-31 NOTE — Telephone Encounter (Signed)
Patient asking if she is cleared to lift without weight restriction and drive, spoke with K. Geralynn Rile who confirms that patient can lift and drive at this time. Patient verbalizes understanding.

## 2023-12-31 NOTE — Patient Instructions (Signed)
Medication Instructions:  Your physician recommends that you continue on your current medications as directed. Please refer to the Current Medication list given to you today.  *If you need a refill on your cardiac medications before your next appointment, please call your pharmacy*   Lab Work: Please complete your lab work (SPEP, UPEP) at the [facility] on the first floor of our building before you leave today.   If you have labs (blood work) drawn today and your tests are completely normal, you will receive your results only by: MyChart Message (if you have MyChart) OR A paper copy in the mail If you have any lab test that is abnormal or we need to change your treatment, we will call you to review the results.   Testing/Procedures: Cline Crock, PA-C has ordered a PYP scan for you. Once this imaging is cleared by insurance, someone will call you to set up an appointment.  Your physician has requested that you have an echocardiogram on 01/21/2024 at 8:00 AM. Echocardiography is a painless test that uses sound waves to create images of your heart. It provides your doctor with information about the size and shape of your heart and how well your heart's chambers and valves are working. This procedure takes approximately one hour. There are no restrictions for this procedure. Please do NOT wear cologne, perfume, aftershave, or lotions (deodorant is allowed). Please arrive 15 minutes prior to your appointment time.  Please note: We ask at that you not bring children with you during ultrasound (echo/ vascular) testing. Due to room size and safety concerns, children are not allowed in the ultrasound rooms during exams. Our front office staff cannot provide observation of children in our lobby area while testing is being conducted. An adult accompanying a patient to their appointment will only be allowed in the ultrasound room at the discretion of the ultrasound technician under special circumstances.  We apologize for any inconvenience.    Follow-Up: At [facility], you and your health needs are our priority.  As part of our continuing mission to provide you with exceptional heart care, we have created designated Provider Care Teams.  These Care Teams include your primary Cardiologist (physician) and Advanced Practice Providers (APPs -  Physician Assistants and Nurse Practitioners) who all work together to provide you with the care you need, when you need it.  We recommend signing up for the patient portal called "MyChart".  Sign up information is provided on this After Visit Summary.  MyChart is used to connect with patients for Virtual Visits (Telemedicine).  Patients are able to view lab/test results, encounter notes, upcoming appointments, etc.  Non-urgent messages can be sent to your provider as well.   To learn more about what you can do with MyChart, go to [email or web address].    Your next appointment:   01/21/24  at 9:25 AM  Provider:   Cline Crock, PA-C

## 2024-01-03 ENCOUNTER — Other Ambulatory Visit (HOSPITAL_COMMUNITY): Payer: Self-pay | Admitting: Physician Assistant

## 2024-01-03 DIAGNOSIS — Z952 Presence of prosthetic heart valve: Secondary | ICD-10-CM | POA: Diagnosis not present

## 2024-01-03 DIAGNOSIS — I43 Cardiomyopathy in diseases classified elsewhere: Secondary | ICD-10-CM | POA: Diagnosis not present

## 2024-01-03 DIAGNOSIS — I10 Essential (primary) hypertension: Secondary | ICD-10-CM | POA: Diagnosis not present

## 2024-01-03 DIAGNOSIS — E854 Organ-limited amyloidosis: Secondary | ICD-10-CM | POA: Diagnosis not present

## 2024-01-03 DIAGNOSIS — I4510 Unspecified right bundle-branch block: Secondary | ICD-10-CM | POA: Diagnosis not present

## 2024-01-03 DIAGNOSIS — I5032 Chronic diastolic (congestive) heart failure: Secondary | ICD-10-CM | POA: Diagnosis not present

## 2024-01-07 LAB — UPEP/UIFE/LIGHT CHAINS/TP, 24-HR UR
% BETA, Urine: 0 %
ALBUMIN, U: 100 %
ALPHA 1 URINE: 0 %
ALPHA-2-GLOBULIN, U: 0 %
Free Kappa Lt Chains,Ur: 12.58 mg/L (ref 1.17–86.46)
Free Lambda Lt Chains,Ur: 1.31 mg/L (ref 0.27–15.21)
GAMMA GLOBULIN URINE: 0 %
Kappa/Lambda Ratio,U: 9.6 (ref 1.83–14.26)
Protein, 24H Urine: 658 mg/(24.h) — ABNORMAL HIGH (ref 30–150)
Protein, Ur: 32.9 mg/dL

## 2024-01-10 LAB — MULTIPLE MYELOMA PANEL, SERUM
Albumin SerPl Elph-Mcnc: 3.6 g/dL (ref 2.9–4.4)
Albumin/Glob SerPl: 1.4 (ref 0.7–1.7)
Alpha 1: 0.2 g/dL (ref 0.0–0.4)
Alpha2 Glob SerPl Elph-Mcnc: 0.7 g/dL (ref 0.4–1.0)
B-Globulin SerPl Elph-Mcnc: 1 g/dL (ref 0.7–1.3)
Gamma Glob SerPl Elph-Mcnc: 0.7 g/dL (ref 0.4–1.8)
Globulin, Total: 2.6 g/dL (ref 2.2–3.9)
IgA/Immunoglobulin A, Serum: 57 mg/dL — ABNORMAL LOW (ref 64–422)
IgG (Immunoglobin G), Serum: 804 mg/dL (ref 586–1602)
IgM (Immunoglobulin M), Srm: 49 mg/dL (ref 26–217)
Total Protein: 6.2 g/dL (ref 6.0–8.5)

## 2024-01-14 ENCOUNTER — Ambulatory Visit (HOSPITAL_COMMUNITY): Payer: Medicare Other | Attending: Physician Assistant

## 2024-01-14 DIAGNOSIS — E854 Organ-limited amyloidosis: Secondary | ICD-10-CM | POA: Diagnosis not present

## 2024-01-14 DIAGNOSIS — I5032 Chronic diastolic (congestive) heart failure: Secondary | ICD-10-CM | POA: Insufficient documentation

## 2024-01-14 DIAGNOSIS — Z952 Presence of prosthetic heart valve: Secondary | ICD-10-CM | POA: Insufficient documentation

## 2024-01-14 DIAGNOSIS — I4510 Unspecified right bundle-branch block: Secondary | ICD-10-CM | POA: Insufficient documentation

## 2024-01-14 DIAGNOSIS — I43 Cardiomyopathy in diseases classified elsewhere: Secondary | ICD-10-CM | POA: Diagnosis not present

## 2024-01-14 DIAGNOSIS — I10 Essential (primary) hypertension: Secondary | ICD-10-CM | POA: Insufficient documentation

## 2024-01-14 LAB — MYOCARDIAL AMYLOID PLANAR & SPECT: H/CL Ratio: 1

## 2024-01-14 MED ORDER — TECHNETIUM TC 99M PYROPHOSPHATE
21.1000 | Freq: Once | INTRAVENOUS | Status: AC
  Administered 2024-01-14: 21.1 via INTRAVENOUS

## 2024-01-17 ENCOUNTER — Ambulatory Visit: Payer: Medicare Other | Admitting: Podiatry

## 2024-01-17 NOTE — Addendum Note (Signed)
 Encounter addended by: Coral Der A on: 01/17/2024 9:09 AM  Actions taken: Imaging Exam ended

## 2024-01-20 NOTE — Progress Notes (Unsigned)
[facility]   MULTIDISCIPLINARY HEART VALVE CLINIC                                     Cardiology Office Note:    Date:  01/21/2024   ID:  Amanda [name], DOB May 05, 1951, MRN [id number]  PCP:  Amanda Beat, MD  [facility] Cardiologist:  Lorine Bears, MD / Dr. Lynnette Caffey and Dr. Leafy Ro (TAVR) [facility] Electrophysiologist:  None   Referring MD: Amanda Beat, MD   1 month s/p TAVR  History of Present Illness:    Amanda [name] is a 73 y.o. female with a hx of RBBB, DM-diet controlled, HTN, HLD, morbid obesity (BMI 35), CKD stage II, CAD, COPD with previous tobacco abuse, mild cognitive impairment and severe aortic stenosis s/p TAVR (12/21/23) who presents to clinic for follow up.  She developed exertional shortness of breath and dizziness. Echo 09/20/23 showed EF 60% and severe AS with mean grad 33 mmHg, AVA 0.66 cm2, DVI 0.22 and mild MR & AI. L/RHC 10/04/23 showed mild nonobstructive coronary artery disease. S/p TAVR with a 34 mm Medtronic Evolut FX THV via the TF approach on 12/21/23. Post operative echo showed EF 70%, normally functioning TAVR with a mean gradient of 8 mmHg and no PVL. She was treated with one dose of IV lasix given elevated LVEDP at the time of TAVR. Discharged on a baby aspirin 81mg  daily. With chronic RBBB and increased risk for HAVB, she was discharged home with a Zio AT which did not show any HAVB (still pending formal read at this time).  Today the patient presents to clinic for follow up. Here today with daughter. Had a mechanical fall in the tub and has some bruising. No CP or SOB. No LE edema, orthopnea or PND. No dizziness or syncope. No blood in stool or urine. No palpitations. She does have some mild chest tightness with exertion that improves with her inhaler. Has ongoing issues with neuropathy.   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 2013: nl LV sys fxn, mild  MR, no evidence of pulm htn; b. TTE 8/17: EF 60-65%, no RWMA, nl LV dia fxn,  mild AS, mod AI   CAD (coronary artery disease), native coronary artery 11/26/2020   Controlled type 2 diabetes mellitus with diabetic autonomic neuropathy, without long-term current use of insulin (HCC) 08/10/2007   Qualifier: Diagnosis of  By: Hetty Ely MD, Franne Grip    COPD (chronic obstructive pulmonary disease) (HCC) 10/30/2016   Depression    Fracture in accidental fall    GAD (generalized anxiety disorder)    GERD (gastroesophageal reflux disease)    Hiatal hernia with gastroesophageal reflux 1997   Hyperlipidemia    Hypertension    Osteoporosis    S/P TAVR (transcatheter aortic valve replacement) 12/21/2023   s/p TAVR with a 34 mm Evolut FX via the TF approach by Dr. Lynnette Caffey & Dr. Leafy Ro   Tobacco abuse      Current Medications: Current Meds  Medication Sig   acetaminophen (TYLENOL) 500 MG tablet Take 1,000 mg by mouth every 8 (eight) hours as needed for mild pain (pain score 1-3) or moderate pain (pain score 4-6).   albuterol (VENTOLIN HFA) 108 (90 Base) MCG/ACT inhaler Inhale 2 puffs into the lungs every 4 (four) hours  as needed for wheezing or shortness of breath.   amitriptyline (ELAVIL) 50 MG tablet Take 0.5 tablets (25 mg total) by mouth at bedtime.   Ascorbic Acid (VITAMIN C) 1000 MG tablet Take 1,000 mg by mouth daily.   aspirin EC 81 MG tablet Take 81 mg by mouth daily.   b complex vitamins capsule Take 1 capsule by mouth daily.   buPROPion (WELLBUTRIN XL) 150 MG 24 hr tablet Take 1 tablet (150 mg total) by mouth daily.   calcium carbonate (TUMS - DOSED IN MG ELEMENTAL CALCIUM) 500 MG chewable tablet Chew 2 tablets by mouth daily as needed for indigestion or heartburn.   Cholecalciferol (VITAMIN D) 1000 UNITS capsule Take 2,000 Units by mouth daily.   clonazePAM (KLONOPIN) 0.5 MG tablet Take 1 tablet (0.5 mg total) by mouth 2 (two) times daily as needed.   cyclobenzaprine (FLEXERIL) 10 MG  tablet Take 1 tablet by mouth three times daily as needed for muscle spasm   dimenhyDRINATE (DRAMAMINE) 50 MG tablet Take 50 mg by mouth every 8 (eight) hours as needed for nausea or dizziness.   donepezil (ARICEPT) 5 MG tablet TAKE 1 TABLET BY MOUTH AT BEDTIME   ezetimibe (ZETIA) 10 MG tablet Take 1 tablet (10 mg total) by mouth daily.   FLUoxetine (PROZAC) 40 MG capsule Take 2 capsules (80 mg total) by mouth daily. (Patient taking differently: Take 40 mg by mouth daily. Additional 40 mg of needed)   fluticasone (FLONASE) 50 MCG/ACT nasal spray Place 2 sprays into both nostrils daily as needed for rhinitis.   gabapentin (NEURONTIN) 300 MG capsule Take 1 capsule (300 mg total) by mouth 2 (two) times daily.   guaiFENesin (MUCINEX) 600 MG 12 hr tablet Take 600 mg by mouth 2 (two) times daily as needed for to loosen phlegm.   lisinopril (ZESTRIL) 5 MG tablet Take 1 tablet (5 mg total) by mouth daily.   metoprolol tartrate (LOPRESSOR) 25 MG tablet TAKE 1/2 TABLET TWICE DAILY   Multiple Vitamin (MULTIVITAMIN) tablet Take 1 tablet by mouth daily.   naproxen sodium (ALEVE) 220 MG tablet Take 220 mg by mouth in the morning.   ondansetron (ZOFRAN-ODT) 4 MG disintegrating tablet Take 1 tablet (4 mg total) by mouth every 8 (eight) hours as needed for nausea or vomiting.   pantoprazole (PROTONIX) 40 MG tablet TAKE 1 TABLET 30 MINUTES PRIOR TO BREAKFAST   rosuvastatin (CRESTOR) 40 MG tablet Take 1 tablet (40 mg total) by mouth daily.   Simethicone 125 MG CAPS Take 125 mg by mouth daily as needed (gas).   traZODone (DESYREL) 100 MG tablet Take 100 mg by mouth at bedtime as needed for sleep.   vitamin E 180 MG (400 UNITS) capsule Take 400 Units by mouth daily.   [DISCONTINUED] gabapentin (NEURONTIN) 100 MG capsule Take 1 capsule (100 mg total) by mouth 2 (two) times daily.      ROS:   Please see the history of present illness.    All other systems reviewed and are negative.  EKGs       Risk  Assessment/Calculations:       Physical Exam:    VS:  BP 130/70 (BP Location: Right Arm)   Pulse 70   Ht 5\' 7"  (1.702 m)   Wt 231 lb (104.8 kg)   SpO2 97%   BMI 36.18 kg/m     Wt Readings from Last 3 Encounters:  01/21/24 231 lb (104.8 kg)  12/31/23 231 lb 9.6 oz (105.1 kg)  12/22/23 225 lb 5 oz (102.2 kg)     GEN: Well nourished, well developed, in no acute distress, obese HEENT: normal Neck: no JVD or masses Cardiac: RRR; no murmurs, rubs, or gallops,no edema  Respiratory:  clear to auscultation bilaterally, normal work of breathing GI: soft, nontender, nondistended, + BS MS: no deformity or atrophy Skin: warm and dry, no rash Neuro:  Alert and Oriented x 3, Strength and sensation are intact Psych: euthymic mood, full affect  ASSESSMENT:    1. S/P TAVR (transcatheter aortic valve replacement)   2. Chronic heart failure with preserved ejection fraction (HCC)   3. RBBB   4. Essential hypertension   5. Amyloid heart disease (HCC)   6. Type 2 diabetes mellitus with complication, without long-term current use of insulin (HCC)   7. Left leg weakness   8. Neuropathy     PLAN:    In order of problems listed above:  Severe AS s/p TAVR: echo today shows EF 65%, normally functioning TAVR with a mean gradient of 6 mm hg and no PVL.  -- She has NYHA class II symptoms.  -- SBE prophylaxis discussed; the patient is edentulous and does not go to the dentist.  -- Continue a baby aspirin 81 mg daily.  -- I encouraged participation in cardiac rehab.  -- I will see back for 1 year echo and OV.  Chronic HFpEF: appears euvolemic off diuretics.   RBBB: no high risk alerts on Zio AT (formal report still pending.)   HTN: BP 130/70 today.  -- Continue Lopressor 12.5 mg BID.  -- Add Lisinopril 5mg  daily given DMT2 with proteinuria.  -- BMET in 2 weeks.   Elevated Raise Score: with RBBB in the setting of severe AS and neuropathy she was screened for amyloid heart disease.  --  Amyloid labs looked okay other than an increased 24 hour urine protein.  -- PYP was negative.   DMT2 with proteinuria: HgA1c 6.4 in 01/2023.  -- 24 hour protein noted to be elevated >600 on myeloma panel.  -- Will add lisinopril 5mg  daily and get follow up BMET in 2 weeks.  -- Follow up with PCP.   Left leg weakness: now resolved.   Neuropathy: increase gabapentin from 100mg  BID to 300mg  BID.  -- Follow up with PCP.      Cardiac Rehabilitation Eligibility Assessment  The patient is ready to start cardiac rehabilitation from a cardiac standpoint.   Medication Adjustments/Labs and Tests Ordered: Current medicines are reviewed at length with the patient today.  Concerns regarding medicines are outlined above.  Orders Placed This Encounter  Procedures   Basic Metabolic Panel (BMET)   Meds ordered this encounter  Medications   lisinopril (ZESTRIL) 5 MG tablet    Sig: Take 1 tablet (5 mg total) by mouth daily.    Dispense:  90 tablet    Refill:  3   gabapentin (NEURONTIN) 300 MG capsule    Sig: Take 1 capsule (300 mg total) by mouth 2 (two) times daily.    Class no print as refill not needed    Patient Instructions  Medication Instructions:  1.Start lisinopril 5 mg daily. 2.Increase gabapentin to 300 mg twice daily. *If you need a refill on your cardiac medications before your next appointment, please call your pharmacy*  Lab Work: BMET in 2 weeks in [place]. If you have labs (blood work) drawn today and your tests are completely normal, you will receive your results only by: MyChart Message (if  you have MyChart) OR A paper copy in the mail If you have any lab test that is abnormal or we need to change your treatment, we will call you to review the results.  Follow-Up: At [facility], you and your health needs are our priority.  As part of our continuing mission to provide you with exceptional heart care, we have created designated Provider Care Teams.  These  Care Teams include your primary Cardiologist (physician) and Advanced Practice Providers (APPs -  Physician Assistants and Nurse Practitioners) who all work together to provide you with the care you need, when you need it.  Your next appointment:   As scheduled      Signed, Cline Crock, PA-C  01/21/2024 11:16 AM    [facility]

## 2024-01-21 ENCOUNTER — Other Ambulatory Visit: Payer: Self-pay | Admitting: Family Medicine

## 2024-01-21 ENCOUNTER — Ambulatory Visit: Payer: Medicare Other | Attending: Cardiovascular Disease

## 2024-01-21 ENCOUNTER — Ambulatory Visit (INDEPENDENT_AMBULATORY_CARE_PROVIDER_SITE_OTHER): Payer: Medicare Other | Admitting: Physician Assistant

## 2024-01-21 ENCOUNTER — Other Ambulatory Visit: Payer: Self-pay | Admitting: *Deleted

## 2024-01-21 VITALS — BP 130/70 | HR 70 | Ht 67.0 in | Wt 231.0 lb

## 2024-01-21 DIAGNOSIS — I10 Essential (primary) hypertension: Secondary | ICD-10-CM | POA: Insufficient documentation

## 2024-01-21 DIAGNOSIS — Z952 Presence of prosthetic heart valve: Secondary | ICD-10-CM

## 2024-01-21 DIAGNOSIS — E854 Organ-limited amyloidosis: Secondary | ICD-10-CM | POA: Insufficient documentation

## 2024-01-21 DIAGNOSIS — R809 Proteinuria, unspecified: Secondary | ICD-10-CM

## 2024-01-21 DIAGNOSIS — G629 Polyneuropathy, unspecified: Secondary | ICD-10-CM | POA: Diagnosis not present

## 2024-01-21 DIAGNOSIS — R29898 Other symptoms and signs involving the musculoskeletal system: Secondary | ICD-10-CM | POA: Diagnosis not present

## 2024-01-21 DIAGNOSIS — I43 Cardiomyopathy in diseases classified elsewhere: Secondary | ICD-10-CM

## 2024-01-21 DIAGNOSIS — I4510 Unspecified right bundle-branch block: Secondary | ICD-10-CM

## 2024-01-21 DIAGNOSIS — I5032 Chronic diastolic (congestive) heart failure: Secondary | ICD-10-CM

## 2024-01-21 DIAGNOSIS — E118 Type 2 diabetes mellitus with unspecified complications: Secondary | ICD-10-CM | POA: Diagnosis not present

## 2024-01-21 LAB — ECHOCARDIOGRAM COMPLETE
AR max vel: 3.13 cm2
AV Area VTI: 3.19 cm2
AV Area mean vel: 3.06 cm2
AV Mean grad: 6 mm[Hg]
AV Peak grad: 12 mm[Hg]
Ao pk vel: 1.74 m/s
Area-P 1/2: 2.29 cm2
S' Lateral: 3 cm

## 2024-01-21 MED ORDER — LISINOPRIL 5 MG PO TABS: 5.0000 mg | ORAL_TABLET | Freq: Every day | ORAL | 3 refills | Status: DC

## 2024-01-21 MED ORDER — GABAPENTIN 300 MG PO CAPS: 300.0000 mg | ORAL_CAPSULE | Freq: Two times a day (BID) | ORAL | Status: DC

## 2024-01-21 NOTE — Patient Instructions (Addendum)
Medication Instructions:  1.Start lisinopril 5 mg daily. 2.Increase gabapentin to 300 mg twice daily. *If you need a refill on your cardiac medications before your next appointment, please call your pharmacy*  Lab Work: BMET in 2 weeks in [place]. If you have labs (blood work) drawn today and your tests are completely normal, you will receive your results only by: MyChart Message (if you have MyChart) OR A paper copy in the mail If you have any lab test that is abnormal or we need to change your treatment, we will call you to review the results.  Follow-Up: At [facility], you and your health needs are our priority.  As part of our continuing mission to provide you with exceptional heart care, we have created designated Provider Care Teams.  These Care Teams include your primary Cardiologist (physician) and Advanced Practice Providers (APPs -  Physician Assistants and Nurse Practitioners) who all work together to provide you with the care you need, when you need it.  Your next appointment:   As scheduled

## 2024-01-21 NOTE — Telephone Encounter (Signed)
Please schedule Medicare Wellness with Steward Drone and CPE with fasting labs prior with Dr. Patsy Lager after 02/10/24.

## 2024-01-21 NOTE — Telephone Encounter (Signed)
Called pt. No answer and Unable to leave voicemail.

## 2024-01-24 ENCOUNTER — Encounter: Payer: Self-pay | Admitting: *Deleted

## 2024-01-24 ENCOUNTER — Other Ambulatory Visit: Payer: Self-pay | Admitting: Family Medicine

## 2024-01-26 ENCOUNTER — Ambulatory Visit: Payer: Medicare Other | Admitting: Podiatry

## 2024-01-28 DIAGNOSIS — I4510 Unspecified right bundle-branch block: Secondary | ICD-10-CM | POA: Diagnosis not present

## 2024-01-28 DIAGNOSIS — Z952 Presence of prosthetic heart valve: Secondary | ICD-10-CM | POA: Diagnosis not present

## 2024-01-31 DIAGNOSIS — G3184 Mild cognitive impairment, so stated: Secondary | ICD-10-CM | POA: Diagnosis not present

## 2024-01-31 DIAGNOSIS — M792 Neuralgia and neuritis, unspecified: Secondary | ICD-10-CM | POA: Diagnosis not present

## 2024-01-31 DIAGNOSIS — E538 Deficiency of other specified B group vitamins: Secondary | ICD-10-CM | POA: Diagnosis not present

## 2024-02-02 ENCOUNTER — Other Ambulatory Visit: Payer: Self-pay | Admitting: Family Medicine

## 2024-02-03 NOTE — Telephone Encounter (Signed)
 Lvmtcb, sent mychart message

## 2024-02-03 NOTE — Telephone Encounter (Addendum)
 Last officer visit 02/10/23 for CPE.  Last refilled 06/17/2023 for #60 with 5 refills.  Next Appt: No future appointments with PCP.  Please schedule CPE with fasting labs prior for Dr. Patsy Lager after 02/21/24.

## 2024-02-07 ENCOUNTER — Encounter: Payer: Medicare Other | Attending: Cardiovascular Disease

## 2024-02-07 ENCOUNTER — Other Ambulatory Visit: Payer: Self-pay

## 2024-02-07 DIAGNOSIS — Z48812 Encounter for surgical aftercare following surgery on the circulatory system: Secondary | ICD-10-CM | POA: Insufficient documentation

## 2024-02-07 DIAGNOSIS — Z79899 Other long term (current) drug therapy: Secondary | ICD-10-CM | POA: Insufficient documentation

## 2024-02-07 DIAGNOSIS — Z952 Presence of prosthetic heart valve: Secondary | ICD-10-CM | POA: Insufficient documentation

## 2024-02-07 NOTE — Progress Notes (Signed)
 Virtual Visit completed. Patient informed on EP and RD appointment and 6 Minute walk test. Patient also informed of patient health questionnaires on My Chart. Patient Verbalizes understanding. Visit diagnosis can be found in [facility] 12/21/2023.

## 2024-02-09 ENCOUNTER — Ambulatory Visit: Payer: Medicare Other

## 2024-02-09 ENCOUNTER — Ambulatory Visit (INDEPENDENT_AMBULATORY_CARE_PROVIDER_SITE_OTHER): Payer: Medicare Other | Admitting: Podiatry

## 2024-02-09 ENCOUNTER — Encounter: Payer: Self-pay | Admitting: Podiatry

## 2024-02-09 DIAGNOSIS — B351 Tinea unguium: Secondary | ICD-10-CM

## 2024-02-09 DIAGNOSIS — E1142 Type 2 diabetes mellitus with diabetic polyneuropathy: Secondary | ICD-10-CM

## 2024-02-09 DIAGNOSIS — M79676 Pain in unspecified toe(s): Secondary | ICD-10-CM | POA: Diagnosis not present

## 2024-02-09 NOTE — Progress Notes (Signed)
 Presents today chief complaint of painfully elongated toenails.  Objective: Toenails are long thick yellow dystrophic clinically mycotic pulses are palpable.  No open lesions or wounds.  Assessment: Pain limb secondary to onychomycosis.  Plan: Debridement of toenails 1 through 5 bilateral.

## 2024-02-21 ENCOUNTER — Encounter: Payer: Self-pay | Admitting: Family Medicine

## 2024-02-21 ENCOUNTER — Encounter

## 2024-02-21 ENCOUNTER — Ambulatory Visit: Payer: Medicare Other | Admitting: Family Medicine

## 2024-02-21 VITALS — Ht 66.5 in | Wt 235.1 lb

## 2024-02-21 VITALS — BP 130/80 | HR 87 | Temp 98.1°F | Ht 65.0 in | Wt 236.1 lb

## 2024-02-21 DIAGNOSIS — Z23 Encounter for immunization: Secondary | ICD-10-CM

## 2024-02-21 DIAGNOSIS — Z0000 Encounter for general adult medical examination without abnormal findings: Secondary | ICD-10-CM

## 2024-02-21 DIAGNOSIS — Z780 Asymptomatic menopausal state: Secondary | ICD-10-CM

## 2024-02-21 DIAGNOSIS — E1143 Type 2 diabetes mellitus with diabetic autonomic (poly)neuropathy: Secondary | ICD-10-CM | POA: Diagnosis not present

## 2024-02-21 DIAGNOSIS — R413 Other amnesia: Secondary | ICD-10-CM | POA: Diagnosis not present

## 2024-02-21 DIAGNOSIS — Z952 Presence of prosthetic heart valve: Secondary | ICD-10-CM | POA: Diagnosis not present

## 2024-02-21 DIAGNOSIS — Z87311 Personal history of (healed) other pathological fracture: Secondary | ICD-10-CM

## 2024-02-21 DIAGNOSIS — E7800 Pure hypercholesterolemia, unspecified: Secondary | ICD-10-CM

## 2024-02-21 DIAGNOSIS — Z48812 Encounter for surgical aftercare following surgery on the circulatory system: Secondary | ICD-10-CM | POA: Diagnosis not present

## 2024-02-21 DIAGNOSIS — J4489 Other specified chronic obstructive pulmonary disease: Secondary | ICD-10-CM

## 2024-02-21 DIAGNOSIS — E559 Vitamin D deficiency, unspecified: Secondary | ICD-10-CM

## 2024-02-21 DIAGNOSIS — Z79899 Other long term (current) drug therapy: Secondary | ICD-10-CM | POA: Diagnosis not present

## 2024-02-21 MED ORDER — TRELEGY ELLIPTA 100-62.5-25 MCG/ACT IN AEPB
1.0000 | INHALATION_SPRAY | Freq: Every day | RESPIRATORY_TRACT | 11 refills | Status: DC
Start: 2024-02-21 — End: 2024-02-23

## 2024-02-21 NOTE — Patient Instructions (Signed)
 Patient Instructions  Patient Details  Name: Amanda [name] MRN: [id number] Date of Birth: 1951/03/25 Referring Provider:  Iran Ouch, MD  Below are your personal goals for exercise, nutrition, and risk factors. Our goal is to help you stay on track towards obtaining and maintaining these goals. We will be discussing your progress on these goals with you throughout the program.  Initial Exercise Prescription:  Initial Exercise Prescription - 02/21/24 1600       Date of Initial Exercise RX and Referring Provider   Date 02/21/24    Referring Provider Dr. Lorine Bears, MD      Oxygen   Maintain Oxygen Saturation 88% or higher      Recumbant Bike   Level 1    RPM 50    Watts 15    Minutes 15    METs 1      NuStep   Level 1    SPM 80    Minutes 15    METs 1      Track   Laps 15    Minutes 15    METs 1.82      Prescription Details   Frequency (times per week) 3    Duration Progress to 30 minutes of continuous aerobic without signs/symptoms of physical distress      Intensity   THRR 40-80% of Max Heartrate 97-130    Ratings of Perceived Exertion 11-13    Perceived Dyspnea 0-4      Progression   Progression Continue to progress workloads to maintain intensity without signs/symptoms of physical distress.      Resistance Training   Training Prescription Yes    Weight 3 lb    Reps 10-15             Exercise Goals: Frequency: Be able to perform aerobic exercise two to three times per week in program working toward 2-5 days per week of home exercise.  Intensity: Work with a perceived exertion of 11 (fairly light) - 15 (hard) while following your exercise prescription.  We will make changes to your prescription with you as you progress through the program.   Duration: Be able to do 30 to 45 minutes of continuous aerobic exercise in addition to a 5 minute warm-up and a 5 minute cool-down routine.   Nutrition Goals: Your personal nutrition goals will be  established when you do your nutrition analysis with the dietician.  The following are general nutrition guidelines to follow: Cholesterol < 200mg /day Sodium < 1500mg /day Fiber: Women over 50 yrs - 21 grams per day  Personal Goals:  Personal Goals and Risk Factors at Admission - 02/07/24 1206       Core Components/Risk Factors/Patient Goals on Admission    Weight Management Yes;Weight Loss    Intervention Weight Management: Develop a combined nutrition and exercise program designed to reach desired caloric intake, while maintaining appropriate intake of nutrient and fiber, sodium and fats, and appropriate energy expenditure required for the weight goal.;Weight Management: Provide education and appropriate resources to help participant work on and attain dietary goals.;Weight Management/Obesity: Establish reasonable short term and long term weight goals.    Expected Outcomes Short Term: Continue to assess and modify interventions until short term weight is achieved;Weight Maintenance: Understanding of the daily nutrition guidelines, which includes 25-35% calories from fat, 7% or less cal from saturated fats, less than 200mg  cholesterol, less than 1.5gm of sodium, & 5 or more servings of fruits and vegetables daily;Weight Loss: Understanding of general  recommendations for a balanced deficit meal plan, which promotes 1-2 lb weight loss per week and includes a negative energy balance of [phone] kcal/d;Understanding recommendations for meals to include 15-35% energy as protein, 25-35% energy from fat, 35-60% energy from carbohydrates, less than 200mg  of dietary cholesterol, 20-35 gm of total fiber daily;Understanding of distribution of calorie intake throughout the day with the consumption of 4-5 meals/snacks    Tobacco Cessation Yes    Number of packs per day 0    Intervention Assist the participant in steps to quit. Provide individualized education and counseling about committing to Tobacco Cessation,  relapse prevention, and pharmacological support that can be provided by physician.;Education officer, environmental, assist with locating and accessing local/national Quit Smoking programs, and support quit date choice.    Expected Outcomes Short Term: Will demonstrate readiness to quit, by selecting a quit date.;Long Term: Complete abstinence from all tobacco products for at least 12 months from quit date.;Short Term: Will quit all tobacco product use, adhering to prevention of relapse plan.    Diabetes Yes    Intervention Provide education about signs/symptoms and action to take for hypo/hyperglycemia.;Provide education about proper nutrition, including hydration, and aerobic/resistive exercise prescription along with prescribed medications to achieve blood glucose in normal ranges: Fasting glucose 65-99 mg/dL    Expected Outcomes Short Term: Participant verbalizes understanding of the signs/symptoms and immediate care of hyper/hypoglycemia, proper foot care and importance of medication, aerobic/resistive exercise and nutrition plan for blood glucose control.;Long Term: Attainment of HbA1C < 7%.    Hypertension Yes    Intervention Provide education on lifestyle modifcations including regular physical activity/exercise, weight management, moderate sodium restriction and increased consumption of fresh fruit, vegetables, and low fat dairy, alcohol moderation, and smoking cessation.;Monitor prescription use compliance.    Expected Outcomes Long Term: Maintenance of blood pressure at goal levels.;Short Term: Continued assessment and intervention until BP is < 140/19mm HG in hypertensive participants. < 130/16mm HG in hypertensive participants with diabetes, heart failure or chronic kidney disease.    Lipids Yes    Intervention Provide education and support for participant on nutrition & aerobic/resistive exercise along with prescribed medications to achieve LDL 70mg , HDL >40mg .    Expected Outcomes Short Term:  Participant states understanding of desired cholesterol values and is compliant with medications prescribed. Participant is following exercise prescription and nutrition guidelines.;Long Term: Cholesterol controlled with medications as prescribed, with individualized exercise RX and with personalized nutrition plan. Value goals: LDL < 70mg , HDL > 40 mg.            Tobacco Use Initial Evaluation: Social History   Tobacco Use  Smoking Status Former   Average packs/day: 0.8 packs/day for 45.0 years (33.8 ttl pk-yrs)   Types: Cigarettes   Start date: 01/2023  Smokeless Tobacco Never  Tobacco Comments   Does not on starting back smoking.   Exercise Goals and Review:  Exercise Goals     Row Name 02/21/24 1558             Exercise Goals   Increase Physical Activity Yes       Intervention Provide advice, education, support and counseling about physical activity/exercise needs.;Develop an individualized exercise prescription for aerobic and resistive training based on initial evaluation findings, risk stratification, comorbidities and participant's personal goals.       Expected Outcomes Short Term: Attend rehab on a regular basis to increase amount of physical activity.;Long Term: Exercising regularly at least 3-5 days a week.;Long Term: Add in home exercise  to make exercise part of routine and to increase amount of physical activity.       Increase Strength and Stamina Yes       Intervention Provide advice, education, support and counseling about physical activity/exercise needs.;Develop an individualized exercise prescription for aerobic and resistive training based on initial evaluation findings, risk stratification, comorbidities and participant's personal goals.       Expected Outcomes Short Term: Increase workloads from initial exercise prescription for resistance, speed, and METs.;Short Term: Perform resistance training exercises routinely during rehab and add in resistance training  at home;Long Term: Improve cardiorespiratory fitness, muscular endurance and strength as measured by increased METs and functional capacity ( )       Able to understand and use rate of perceived exertion (RPE) scale Yes       Intervention Provide education and explanation on how to use RPE scale       Expected Outcomes Short Term: Able to use RPE daily in rehab to express subjective intensity level;Long Term:  Able to use RPE to guide intensity level when exercising independently       Able to understand and use Dyspnea scale Yes       Intervention Provide education and explanation on how to use Dyspnea scale       Expected Outcomes Short Term: Able to use Dyspnea scale daily in rehab to express subjective sense of shortness of breath during exertion;Long Term: Able to use Dyspnea scale to guide intensity level when exercising independently       Knowledge and understanding of Target Heart Rate Range (THRR) Yes       Intervention Provide education and explanation of THRR including how the numbers were predicted and where they are located for reference       Expected Outcomes Short Term: Able to state/look up THRR;Long Term: Able to use THRR to govern intensity when exercising independently;Short Term: Able to use daily as guideline for intensity in rehab       Able to check pulse independently Yes       Intervention Provide education and demonstration on how to check pulse in carotid and radial arteries.;Review the importance of being able to check your own pulse for safety during independent exercise       Expected Outcomes Short Term: Able to explain why pulse checking is important during independent exercise;Long Term: Able to check pulse independently and accurately       Understanding of Exercise Prescription Yes       Intervention Provide education, explanation, and written materials on patient's individual exercise prescription       Expected Outcomes Short Term: Able to explain program  exercise prescription;Long Term: Able to explain home exercise prescription to exercise independently

## 2024-02-21 NOTE — Progress Notes (Unsigned)
 Cohen Doleman T. Mahlet Jergens, MD, CAQ Sports Medicine [facility] [address] [place], [address]  Phone: [phone]  FAX: [phone]  Amanda [name] - 73 y.o. female  MRN [id number]  Date of Birth: Aug 05, 1951  Date: 02/21/2024  PCP: Hannah Beat, MD  Referral: Hannah Beat, MD  Chief Complaint  Patient presents with   Medicare Wellness   Patient Care Team: Hannah Beat, MD as PCP - General (Family Medicine) Iran Ouch, MD as PCP - Cardiology (Cardiology) Vilinda Flake, [facility] (Inactive) as Pharmacist (Pharmacist) Subjective:   Amanda [name] is a 73 y.o. pleasant patient who presents for a medicare wellness examination:  Health Maintenance Summary Reviewed and updated, unless pt declines services.  Tobacco History Reviewed. Non-smoker Alcohol: No concerns, no excessive use Exercise Habits: Some activity, rec at least 30 mins 5 times a week STD concerns: none Drug Use: None Birth control method: n/a Menses regular: n/a Lumps or breast concerns: no Breast Cancer Family History: no  She is very well-known patient who has numerous medical problems.  Her husband also recently passed.  Started Cardiac rehab  Really depressed and down and out. Not sleeping all that well  Trouble with her balance  Stopped smoking   - Taking prozac 40 mg x 3, 80 mg x 3  Flu  Shingrix - get vaccine information from [employer] DEXA COVID booster RSV Eye exam Foot exam A1c  She also has hypertension, hyperlipidemia, smoker.  She had a recent TAVR due to severe aortic stenosis.    Diabetes Mellitus: Tolerating Medications: yes Compliance with diet: fair, Body mass index is 39.29 kg/m. Exercise: minimal / intermittent Avg blood sugars at home: not checking Foot problems: none Hypoglycemia: none No nausea, vomitting, blurred vision, polyuria.  Lab Results  Component Value Date   HGBA1C 6.4 01/28/2023   HGBA1C 6.1  (A) 08/12/2022   HGBA1C 6.4 (H) 11/26/2021   Lab Results  Component Value Date   MICROALBUR 3.4 (H) 01/28/2023   LDLCALC 57 01/28/2023   CREATININE 0.83 12/22/2023    Wt Readings from Last 3 Encounters:  02/21/24 236 lb 2 oz (107.1 kg)  02/21/24 235 lb 1.6 oz (106.6 kg)  01/21/24 231 lb (104.8 kg)    HTN: Tolerating all medications without side effects Stable and at goal No CP, no sob. No HA.  BP Readings from Last 3 Encounters:  02/21/24 130/80  01/21/24 130/70  12/31/23 108/60    Basic Metabolic Panel:    Component Value Date/Time   NA 140 12/22/2023 0418   NA 143 09/22/2023 1455   K 4.0 12/22/2023 0418   CL 105 12/22/2023 0418   CO2 27 12/22/2023 0418   BUN 6 (L) 12/22/2023 0418   BUN 8 09/22/2023 1455   CREATININE 0.83 12/22/2023 0418   GLUCOSE 114 (H) 12/22/2023 0418   CALCIUM 8.8 (L) 12/22/2023 0418    Lipids: Doing well, stable. Tolerating meds fine with no SE. Panel reviewed with patient.  Lipids: Lab Results  Component Value Date   CHOL 138 01/28/2023   Lab Results  Component Value Date   HDL 62.40 01/28/2023   Lab Results  Component Value Date   LDLCALC 57 01/28/2023   Lab Results  Component Value Date   TRIG 94.0 01/28/2023   Lab Results  Component Value Date   CHOLHDL 2 01/28/2023    Lab Results  Component Value Date   ALT 17 12/17/2023   AST 22 12/17/2023  ALKPHOS 34 (L) 12/17/2023   BILITOT 0.9 12/17/2023     Health Maintenance  Topic Date Due   DEXA SCAN  Never done   OPHTHALMOLOGY EXAM  07/30/2020   HEMOGLOBIN A1C  07/29/2023   COVID-19 Vaccine (5 - 2024-25 season) 08/08/2023   FOOT EXAM  10/08/2023   Medicare Annual Wellness (AWV)  12/19/2023   Diabetic kidney evaluation - Urine ACR  01/29/2024   INFLUENZA VACCINE  03/06/2024 (Originally 07/08/2023)   MAMMOGRAM  09/16/2024   Lung Cancer Screening  11/09/2024   Diabetic kidney evaluation - eGFR measurement  12/21/2024   Colonoscopy  11/19/2025   DTaP/Tdap/Td (3 -  Tdap) 09/14/2027   Pneumonia Vaccine 92+ Years old  Completed   Hepatitis C Screening  Completed   Zoster Vaccines- Shingrix  Completed   HPV VACCINES  Aged Out   Immunization History  Administered Date(s) Administered   Fluad Quad(high Dose 65+) 08/15/2020, 08/12/2022   Influenza Split 09/09/2011, 10/10/2012   Influenza Whole 09/20/2006   Influenza, High Dose Seasonal PF 08/15/2019   Influenza,inj,Quad PF,6+ Mos 09/18/2015, 09/16/2016, 09/13/2017   PFIZER(Purple Top)SARS-COV-2 Vaccination 01/25/2020, 02/01/2020, 10/18/2020   PPD Test 12/27/2015   Pfizer(Comirnaty)Fall Seasonal Vaccine 12 years and older 03/10/2023   Pneumococcal Conjugate-13 09/18/2015   Pneumococcal Polysaccharide-23 11/23/2016   Td 12/26/1998, 09/13/2017   Zoster Recombinant(Shingrix) 03/10/2023, 06/03/2023    Patient Active Problem List   Diagnosis Date Noted   CAD (coronary artery disease), native coronary artery 11/26/2020    Priority: High   COPD with chronic bronchitis (HCC) 10/30/2016    Priority: High   Controlled type 2 diabetes mellitus with diabetic autonomic neuropathy, without long-term current use of insulin (HCC) 08/10/2007    Priority: High   S/P TAVR (transcatheter aortic valve replacement) 12/21/2023    Priority: Medium    Severe aortic stenosis     Priority: Medium    Smoker 05/10/2015    Priority: Medium    Essential hypertension     Priority: Medium    Pure hypercholesterolemia 12/07/2007    Priority: Medium    Major depressive disorder, recurrent episode, in partial remission (HCC) 12/07/2007    Priority: Medium    RBBB 12/21/2023   Gastroesophageal reflux disease 09/17/2016   Solitary pulmonary nodule 03/28/2013   Vitamin D deficiency 05/29/2009    Past Medical History:  Diagnosis Date   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 sting    Aortic stenosis    a. 2013: nl LV sys fxn, mild MR, no evidence of pulm htn; b. TTE 8/17: EF 60-65%, no RWMA, nl LV dia fxn,  mild AS, mod AI   CAD (coronary artery  disease), native coronary artery 11/26/2020   Controlled type 2 diabetes mellitus with diabetic autonomic neuropathy, without long-term current use of insulin (HCC) 08/10/2007   COPD (chronic obstructive pulmonary disease) (HCC) 10/30/2016   Depression    GAD (generalized anxiety disorder)    GERD (gastroesophageal reflux disease)    Hiatal hernia with gastroesophageal reflux 1997   Hyperlipidemia    Hypertension    Osteoporosis    S/P TAVR (transcatheter aortic valve replacement) 12/21/2023   s/p TAVR with a 34 mm Evolut FX via the TF approach by Dr. Lynnette Caffey & Dr. Leafy Ro   Tobacco abuse     Past Surgical History:  Procedure Laterality Date   ABDOMINAL HYSTERECTOMY  1993   ANTERIOR CERVICAL DECOMP/DISCECTOMY FUSION N/A 10/26/2018   Procedure: ACDF C4-C5 C5-C6 C6-C7;  Surgeon: Donalee Citrin, MD;  Location: [facility] OR;  Service: Neurosurgery;  Laterality: N/A;   APPENDECTOMY     BACK SURGERY     BREAST BIOPSY Right 2007   benign   CHOLECYSTECTOMY     COLONOSCOPY     30 years ago was normal per pt.    DILATION AND CURETTAGE OF UTERUS     ESOPHAGEAL DILATION     x 3   ESOPHAGOGASTRODUODENOSCOPY (EGD) WITH PROPOFOL N/A 02/06/2022   Procedure: ESOPHAGOGASTRODUODENOSCOPY (EGD) WITH PROPOFOL;  Surgeon: Wyline Mood, MD;  Location: [facility] ENDOSCOPY;  Service: Gastroenterology;  Laterality: N/A;   ESOPHAGOGASTRODUODENOSCOPY (EGD) WITH PROPOFOL N/A 02/26/2022   Procedure: ESOPHAGOGASTRODUODENOSCOPY (EGD) WITH PROPOFOL;  Surgeon: Wyline Mood, MD;  Location: [facility] ENDOSCOPY;  Service: Gastroenterology;  Laterality: N/A;  EGD+dilation per Dr. Tobi Bastos   fractured leg Right    INCISION AND DRAINAGE Right 12/02/2021   Procedure: INCISION AND DRAINAGE;  Surgeon: Kennedy Bucker, MD;  Location: [facility] ORS;  Service: Orthopedics;  Laterality: Right;   INTRAOPERATIVE TRANSTHORACIC ECHOCARDIOGRAM N/A 12/21/2023   Procedure: INTRAOPERATIVE TRANSTHORACIC ECHOCARDIOGRAM;  Surgeon: Orbie Pyo, MD;  [facility]: [facility]  INVASIVE CV LAB;  Service: Cardiovascular;  Laterality: N/A;   LEFT HEART CATH AND CORONARY ANGIOGRAPHY N/A 11/11/2020   Procedure: LEFT HEART CATH AND CORONARY ANGIOGRAPHY;  Surgeon: Iran Ouch, MD;  Location: [facility] INVASIVE CV LAB;  Service: Cardiovascular;  Laterality: N/A;   OOPHORECTOMY     OVARIAN CYST REMOVAL  1972   RIGHT/LEFT HEART CATH AND CORONARY ANGIOGRAPHY Bilateral 10/04/2023   Procedure: RIGHT/LEFT HEART CATH AND CORONARY ANGIOGRAPHY;  Surgeon: Iran Ouch, MD;  Location: [facility] INVASIVE CV LAB;  Service: Cardiovascular;  Laterality: Bilateral;   SALIVARY GLAND SURGERY     TONSILLECTOMY     4 yoa   TOTAL KNEE ARTHROPLASTY Left 12/24/2015   Procedure: LEFT TOTAL KNEE ARTHROPLASTY;  Surgeon: Cammy Copa, MD;  Location: [facility] OR;  Service: Orthopedics;  Laterality: Left;   TOTAL KNEE ARTHROPLASTY Right 12/30/2017   Procedure: RIGHT TOTAL KNEE ARTHROPLASTY;  Surgeon: Cammy Copa, MD;  Location: [facility] OR;  Service: Orthopedics;  Laterality: Right;   WRIST FRACTURE SURGERY  11/2008    Family History  Problem Relation Age of Onset   Hyperlipidemia Mother    Arthritis Mother    Diabetes Mother    Heart disease Mother    Colon polyps Mother    Cancer Paternal Grandmother        breast   Hypercholesterolemia Sister    Colon polyps Sister    Breast cancer Sister    Colon cancer Neg Hx    Esophageal cancer Neg Hx    Rectal cancer Neg Hx    Stomach cancer Neg Hx    Pancreatic cancer Neg Hx     Social History   Social History Narrative   Not on file    Past Medical History, Surgical History, Social History, Family History, Problem List, Medications, and Allergies have been reviewed and updated if relevant.  Review of Systems: Pertinent positives are listed above.  Otherwise, a full 14 point review of systems has been done in full and it is negative except where it is noted positive.  Objective:   BP 130/80 (BP Location: Left Arm, Patient Position:  Sitting, Cuff Size: Large)   Pulse 87   Temp 98.1 F (36.7 C) (Temporal)   Ht 5\' 5"  (1.651 m)   Wt 236 lb 2 oz (107.1 kg)   SpO2 96%   BMI 39.29 kg/m     02/26/2022    7:58 AM  12/18/2022    9:57 AM 01/28/2023   11:07 AM 02/07/2024   11:54 AM 02/21/2024    3:55 PM  Fall Risk  Falls in the past year?  1 1 1 1   Was there an injury with Fall?   1 0 1  Fall Risk Category Calculator   3 2 3   (RETIRED) Patient Fall Risk Level High fall risk High fall risk     Patient at Risk for Falls Due to   History of fall(s) Impaired balance/gait;Impaired mobility Impaired mobility;History of fall(s)  Fall risk Follow up  Falls evaluation completed;Falls prevention discussed Falls evaluation completed Falls evaluation completed;Education provided;Falls prevention discussed Falls evaluation completed   Ideal Body Weight: Weight in (lb) to have BMI = 25: 149.9 Hearing Screening  Method: Audiometry   500Hz  1000Hz  2000Hz  4000Hz   Right ear 20 20 20 20   Left ear 25 25 20 20    Vision Screening   Right eye Left eye Both eyes  Without correction 20/30 20/25 20/25   With correction         02/21/2024    3:54 PM 02/21/2024    3:53 PM 01/28/2023   11:07 AM 12/18/2022    9:54 AM 12/17/2021   12:33 PM  Depression screen PHQ 2/9  Decreased Interest 3 3 2  0 1  Down, Depressed, Hopeless 3 3 3  0 2  PHQ - 2 Score 6 6 5  0 3  Altered sleeping 3 3 3  2   Tired, decreased energy 3 3 3  3   Change in appetite 3 3 3  2   Feeling bad or failure about yourself  2 3 1  1   Trouble concentrating 3 3 3  3   Moving slowly or fidgety/restless 3 0 2  3  Suicidal thoughts 0 0 0  0  PHQ-9 Score 23 21 20  17   Difficult doing work/chores Extremely dIfficult Very difficult Very difficult  Somewhat difficult     GEN: well developed, well nourished, no acute distress Eyes: conjunctiva and lids normal, PERRLA, EOMI ENT: TM clear, nares clear, oral exam WNL Neck: supple, no lymphadenopathy, no thyromegaly, no JVD Pulm: clear to  auscultation and percussion, respiratory effort normal CV: regular rate and rhythm, S1-S2, no murmur, rub or gallop, no bruits Chest: no scars, masses, no lumps BREAST: no lumps, no axillary LAD, no nipple discharge GI: soft, non-tender; no hepatosplenomegaly, masses; active bowel sounds all quadrants GU: Normal external female genitalia. Cervix appears intact without lesions or irritation. Vaginal canal normal without ulceration or lesion. Cervix NT to exam. Ovaries neither enlarged nor tender. (Chaperoned examination by female staff) Lymph: no cervical, axillary or inguinal adenopathy MSK: gait normal, muscle tone and strength WNL, no joint swelling, effusions, discoloration, crepitus  SKIN: clear, good turgor, color WNL, no rashes, lesions, or ulcerations Neuro: normal mental status, normal strength, sensation, and motion Psych: alert; oriented to person, place and time, normally interactive and not anxious or depressed in appearance.  All labs reviewed with patient.  Results for orders placed or performed in visit on 01/21/24  ECHOCARDIOGRAM COMPLETE   Collection Time: 01/21/24  9:03 AM  Result Value Ref Range   Area-P 1/2 2.29 cm2   S' Lateral 3.00 cm   AV Area mean vel 3.06 cm2   AR max vel 3.13 cm2   AV Area VTI 3.19 cm2   Ao pk vel 1.74 m/s   AV Mean grad 6.0 mmHg   AV Peak grad 12.0 mmHg   Est EF 65 -  70%     No results found.  Assessment and Plan:     ICD-10-CM   1. Healthcare maintenance  Z00.00     2. Controlled type 2 diabetes mellitus with diabetic autonomic neuropathy, without long-term current use of insulin (HCC)  E11.43 Hemoglobin A1c    Microalbumin / creatinine urine ratio    3. Pure hypercholesterolemia  E78.00 Lipid panel    Basic metabolic panel    Hepatic Function Panel    4. Vitamin D deficiency  E55.9 VITAMIN D 25 Hydroxy (Vit-D Deficiency, Fractures)    5. Encounter for long-term (current) use of medications  Z79.899 CBC with  Differential/Platelet    TSH    6. Memory loss  R41.3 Vitamin B12    7. History of fragility fracture  Z87.311 DG Bone Density    8. Post-menopausal  Z78.0 DG Bone Density      Health Maintenance Exam: The patient's preventative maintenance and recommended screening tests for an annual wellness exam were reviewed in full today. Brought up to date unless services declined.  Counselled on the importance of diet, exercise, and its role in overall health and mortality. The patient's FH and SH was reviewed, including their home life, tobacco status, and drug and alcohol status.  Follow-up in 1 year for physical exam or additional follow-up below.  I have personally reviewed the Medicare Annual Wellness questionnaire and have noted 1. The patient's medical and social history 2. Their use of alcohol, tobacco or illicit drugs 3. Their current medications and supplements 4. The patient's functional ability including ADL's, fall risks, home safety risks and hearing or visual             impairment. 5. Diet and physical activities 6. Evidence for depression or mood disorders 7. Reviewed Updated provider list, see scanned forms and [facility] Snapshot.  8. Reviewed whether or not the patient has HCPOA or living will, and discussed what this means with the patient.  Recommended she bring in a copy for his chart in [facility].  The patients weight, height, BMI and visual acuity have been recorded in the chart I have made referrals, counseling and provided education to the patient based review of the above and I have provided the pt with a written personalized care plan for preventive services.  I have provided the patient with a copy of your personalized plan for preventive services. Instructed to take the time to review along with their updated medication list.  Disposition: No follow-ups on file.  Future Appointments  Date Time Provider Department Center  02/23/2024  1:45 PM [facility]  PROGRAM SESSION [facility] None  02/23/2024  2:45 PM [facility]UAL C[facility] CARDPULM [facility] None  02/24/2024  1:45 PM [facility] PROGRAM SESSION [facility] None  02/28/2024  1:45 PM [facility] PROGRAM SESSION [facility] None  03/01/2024  1:45 PM [facility] PROGRAM SESSION [facility] None  03/02/2024  1:45 PM [facility] PROGRAM SESSION [facility] None  03/06/2024  1:45 PM [facility] PROGRAM SESSION [facility] None  03/08/2024  1:45 PM [facility] PROGRAM SESSION [facility] None  03/09/2024  1:45 PM [facility] PROGRAM SESSION [facility] None  03/13/2024  1:45 PM [facility] REHAB PROGRAM SESSION [facility] None  03/15/2024  1:45 PM [facility] REHAB PROGRAM SESSION [facility] None  03/16/2024  1:45 PM [facility] REHAB PROGRAM SESSION [facility] None  03/20/2024  1:45 PM [facility] REHAB PROGRAM SESSION [facility] None  03/22/2024  1:45 PM [facility] REHAB PROGRAM SESSION [facility] None  03/23/2024  1:45 PM [facility] REHAB  PROGRAM SESSION [facility] None  03/27/2024  1:45 PM [facility] REHAB PROGRAM SESSION [facility] None  03/29/2024  1:45 PM [facility] REHAB PROGRAM SESSION [facility] None  03/30/2024  1:45 PM [facility] REHAB PROGRAM SESSION [facility] None  04/03/2024  1:45 PM [facility] REHAB PROGRAM SESSION [facility] None  04/05/2024  1:45 PM [facility] REH[facility] PROGRAM SESSION [facility] None  04/05/2024  3:10 PM Dunn, Ryan M, PA-C [facility] None  04/06/2024  1:45 PM [facility] REH[facility] PROGRAM SESSION [facility] None  04/10/2024  1:45 PM [facility] PROGRAM SESSION [facility] None  04/12/2024  1:45 PM [facility] PROGRAM SESSION [facility] None  04/13/2024  1:45 PM [facility] REHAB PROGRAM SESSION [facility] None   04/17/2024  1:45 PM [facility] REHAB PROGRAM SESSION [facility] None  04/19/2024  1:45 PM [facility] REHAB PROGRAM SESSION [facility] None  04/20/2024  1:45 PM [facility] REHAB PROGRAM SESSION [facility] None  04/24/2024  1:45 PM [facility] REHAB PROGRAM SESSION [facility] None  04/26/2024  1:45 PM [facility] REHAB PROGRAM SESSION [facility] None  04/27/2024  1:45 PM [facility] REHAB PROGRAM SESSION [facility] None  05/03/2024  1:45 PM [facility] REHAB PROGRAM SESSION [facility] None  05/04/2024  1:45 PM [facility] REHAB PROGRAM SESSION [facility] None  05/08/2024  1:45 PM [facility] REHAB PROGRAM SESSION [facility] None  05/10/2024  1:45 PM [facility] REH[facility] PROGRAM SESSION [facility] None  05/10/2024  2:45 PM Hyatt, Max T, DPM [facility]  05/11/2024  1:45 PM [facility] REH[facility] PROGRAM SESSION [facility] None  05/15/2024  1:45 PM [facility] PROGRAM SESSION [facility] None  05/17/2024  1:45 PM [facility] PROGRAM SESSION [facility] None  05/18/2024  1:45 PM [facility] PROGRAM SESSION [facility] None  05/22/2024  1:45 PM [facility] PROGRAM SESSION [facility] None  12/21/2024  1:45 PM [facility]  12/21/2024  1:55 PM Janetta Hora, PA-C [facility]    No orders of the defined types were placed in this encounter.  Medications Discontinued During This Encounter  Medication Reason   gabapentin (NEURONTIN) 100 MG capsule Dose change   traZODone (DESYREL) 100 MG tablet Side effect (s)   FLUoxetine (PROZAC) 40 MG capsule Duplicate   Orders Placed This Encounter  Procedures   DG Bone Density   Lipid panel   Hemoglobin A1c   Microalbumin / creatinine urine ratio   CBC with Differential/Platelet   Basic metabolic panel   Hepatic Function Panel   Vitamin  B12   TSH   VITAMIN D 25 Hydroxy (Vit-D Deficiency, Fractures)    Signed,  Rafel Garde T. Atziry Baranski, MD   Allergies as of 02/21/2024       Reactions   Honey Bee Venom [bee Venom] Anaphylaxis   Diazepam Other (See Comments)   REACTION: pt states she gets very angry and abusive verbally on this medication        Medication List        Accurate as of February 21, 2024  4:24 PM. If you have any questions, ask your nurse or doctor.          STOP taking these medications    traZODone 100 MG tablet Commonly known as: DESYREL Stopped by: Karleen Hampshire Kalianna Verbeke       TAKE these medications    acetaminophen 500 MG tablet Commonly known as: TYLENOL Take 1,000 mg by mouth every 8 (eight) hours as needed for mild pain (pain score 1-3) or moderate pain (pain score 4-6).   albuterol 108 (90 Base) MCG/ACT inhaler Commonly known as: VENTOLIN HFA Inhale 2 puffs into the lungs every  4 (four) hours as needed for wheezing or shortness of breath.   amitriptyline 50 MG tablet Commonly known as: ELAVIL Take 0.5 tablets (25 mg total) by mouth at bedtime.   aspirin EC 81 MG tablet Take 81 mg by mouth daily.   b complex vitamins capsule Take 1 capsule by mouth daily.   BD Syringe Slip Tip 26G X 5/8" 1 ML Misc Generic drug: SYRINGE/NEEDLE (DISP) 1 ML Use as directed for up to 30 days For Vitamin B12 injections   buPROPion 150 MG 24 hr tablet Commonly known as: WELLBUTRIN XL Take 1 tablet (150 mg total) by mouth daily.   calcium carbonate 500 MG chewable tablet Commonly known as: TUMS - dosed in mg elemental calcium Chew 2 tablets by mouth daily as needed for indigestion or heartburn.   clonazePAM 0.5 MG tablet Commonly known as: KLONOPIN Take 1 tablet by mouth twice daily as needed   cyanocobalamin 1000 MCG/ML injection Commonly known as: VITAMIN B12 Inject into the muscle.   cyclobenzaprine 10 MG tablet Commonly known as: FLEXERIL Take 1 tablet by mouth three times daily as  needed for muscle spasm   dimenhyDRINATE 50 MG tablet Commonly known as: DRAMAMINE Take 50 mg by mouth every 8 (eight) hours as needed for nausea or dizziness.   donepezil 5 MG tablet Commonly known as: ARICEPT TAKE 1 TABLET BY MOUTH AT BEDTIME   ezetimibe 10 MG tablet Commonly known as: ZETIA Take 1 tablet (10 mg total) by mouth daily.   FLUoxetine 40 MG capsule Commonly known as: PROZAC daily. 40 mg Tues, Thurs, Sat and Sunday 80 mg M, W F   fluticasone 50 MCG/ACT nasal spray Commonly known as: FLONASE Place 2 sprays into both nostrils daily as needed for rhinitis.   gabapentin 300 MG capsule Commonly known as: Neurontin Take 1 capsule (300 mg total) by mouth 2 (two) times daily. What changed: Another medication with the same name was removed. Continue taking this medication, and follow the directions you see here. Changed by: Karleen Hampshire Adolphe Fortunato   guaiFENesin 600 MG 12 hr tablet Commonly known as: MUCINEX Take 600 mg by mouth 2 (two) times daily as needed for to loosen phlegm.   lisinopril 5 MG tablet Commonly known as: ZESTRIL Take 1 tablet (5 mg total) by mouth daily.   metoprolol tartrate 25 MG tablet Commonly known as: LOPRESSOR TAKE 1/2 TABLET TWICE DAILY   multivitamin tablet Take 1 tablet by mouth daily.   naproxen sodium 220 MG tablet Commonly known as: ALEVE Take 220 mg by mouth in the morning.   ondansetron 4 MG disintegrating tablet Commonly known as: ZOFRAN-ODT Take 1 tablet (4 mg total) by mouth every 8 (eight) hours as needed for nausea or vomiting.   pantoprazole 40 MG tablet Commonly known as: PROTONIX TAKE 1 TABLET 30 MINUTES PRIOR TO BREAKFAST   rosuvastatin 40 MG tablet Commonly known as: CRESTOR Take 1 tablet by mouth once daily   Simethicone 125 MG Caps Take 125 mg by mouth daily as needed (gas).   vitamin C 1000 MG tablet Take 1,000 mg by mouth daily.   Vitamin D 1000 units capsule Take 2,000 Units by mouth daily.   vitamin E  180 MG (400 UNITS) capsule Take 400 Units by mouth daily.

## 2024-02-21 NOTE — Patient Instructions (Addendum)
 Increase the fluoxetine (Prozac) to 2 capsules each day (80 mg)  Eye exam for diabetic retinopathy  We will start Trelegy one puff each day  RSV vaccine

## 2024-02-21 NOTE — Progress Notes (Signed)
 Cardiac Individual Treatment Plan  Patient Details  Name: Amanda [name] MRN: [id number] Date of Birth: [birth year] Referring Provider:   Flowsheet Row Cardiac Rehab from 02/21/2024 in [facility] Cardiac and Pulmonary Rehab  Referring Provider Dr. Lorine Bears, MD       Initial Encounter Date:  Flowsheet Row Cardiac Rehab from 02/21/2024 in [facility] Cardiac and Pulmonary Rehab  Date 02/21/24       Visit Diagnosis: S/P TAVR (transcatheter aortic valve replacement)  Patient's Home Medications on Admission:  Current Outpatient Medications:    acetaminophen (TYLENOL) 500 MG tablet, Take 1,000 mg by mouth every 8 (eight) hours as needed for mild pain (pain score 1-3) or moderate pain (pain score 4-6)., Disp: , Rfl:    albuterol (VENTOLIN HFA) 108 (90 Base) MCG/ACT inhaler, Inhale 2 puffs into the lungs every 4 (four) hours as needed for wheezing or shortness of breath., Disp: 3 each, Rfl: 2   amitriptyline (ELAVIL) 50 MG tablet, Take 0.5 tablets (25 mg total) by mouth at bedtime., Disp: 45 tablet, Rfl: 1   Ascorbic Acid (VITAMIN C) 1000 MG tablet, Take 1,000 mg by mouth daily., Disp: , Rfl:    aspirin EC 81 MG tablet, Take 81 mg by mouth daily., Disp: , Rfl:    b complex vitamins capsule, Take 1 capsule by mouth daily., Disp: , Rfl:    buPROPion (WELLBUTRIN XL) 150 MG 24 hr tablet, Take 1 tablet (150 mg total) by mouth daily., Disp: 90 tablet, Rfl: 1   calcium carbonate (TUMS - DOSED IN MG ELEMENTAL CALCIUM) 500 MG chewable tablet, Chew 2 tablets by mouth daily as needed for indigestion or heartburn., Disp: , Rfl:    Cholecalciferol (VITAMIN D) 1000 UNITS capsule, Take 2,000 Units by mouth daily., Disp: , Rfl:    clonazePAM (KLONOPIN) 0.5 MG tablet, Take 1 tablet by mouth twice daily as needed, Disp: 60 tablet, Rfl: 5   cyanocobalamin (VITAMIN B12) 1000 MCG/ML injection, Inject into the muscle., Disp: , Rfl:    cyclobenzaprine (FLEXERIL) 10 MG tablet, Take 1 tablet by mouth three times daily as  needed for muscle spasm, Disp: 60 tablet, Rfl: 3   dimenhyDRINATE (DRAMAMINE) 50 MG tablet, Take 50 mg by mouth every 8 (eight) hours as needed for nausea or dizziness., Disp: , Rfl:    donepezil (ARICEPT) 5 MG tablet, TAKE 1 TABLET BY MOUTH AT BEDTIME, Disp: 90 tablet, Rfl: 0   ezetimibe (ZETIA) 10 MG tablet, Take 1 tablet (10 mg total) by mouth daily., Disp: 90 tablet, Rfl: 1   FLUoxetine (PROZAC) 40 MG capsule, daily. 40 mg Tues, Thurs, Sat and Sunday 80 mg M, W F, Disp: , Rfl:    fluticasone (FLONASE) 50 MCG/ACT nasal spray, Place 2 sprays into both nostrils daily as needed for rhinitis., Disp: , Rfl:    gabapentin (NEURONTIN) 300 MG capsule, Take 1 capsule (300 mg total) by mouth 2 (two) times daily., Disp: , Rfl:    guaiFENesin (MUCINEX) 600 MG 12 hr tablet, Take 600 mg by mouth 2 (two) times daily as needed for to loosen phlegm., Disp: , Rfl:    lisinopril (ZESTRIL) 5 MG tablet, Take 1 tablet (5 mg total) by mouth daily., Disp: 90 tablet, Rfl: 3   metoprolol tartrate (LOPRESSOR) 25 MG tablet, TAKE 1/2 TABLET TWICE DAILY, Disp: 90 tablet, Rfl: 1   Multiple Vitamin (MULTIVITAMIN) tablet, Take 1 tablet by mouth daily., Disp: , Rfl:    naproxen sodium (ALEVE) 220 MG tablet, Take 220 mg by mouth  in the morning., Disp: , Rfl:    ondansetron (ZOFRAN-ODT) 4 MG disintegrating tablet, Take 1 tablet (4 mg total) by mouth every 8 (eight) hours as needed for nausea or vomiting., Disp: 15 tablet, Rfl: 0   pantoprazole (PROTONIX) 40 MG tablet, TAKE 1 TABLET 30 MINUTES PRIOR TO BREAKFAST, Disp: 90 tablet, Rfl: 3   rosuvastatin (CRESTOR) 40 MG tablet, Take 1 tablet by mouth once daily, Disp: 90 tablet, Rfl: 0   Simethicone 125 MG CAPS, Take 125 mg by mouth daily as needed (gas)., Disp: , Rfl:    SYRINGE/NEEDLE, DISP, 1 ML (BD SYRINGE SLIP TIP) 26G X 5/8" 1 ML MISC, Use as directed for up to 30 days For Vitamin B12 injections, Disp: , Rfl:    vitamin E 180 MG (400 UNITS) capsule, Take 400 Units by mouth  daily., Disp: , Rfl:   Past Medical History: Past Medical History:  Diagnosis Date   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 sting    Aortic stenosis    a. 2013: nl LV sys fxn, mild MR, no evidence of pulm htn; b. TTE 8/17: EF 60-65%, no RWMA, nl LV dia fxn,  mild AS, mod AI   CAD (coronary artery disease), native coronary artery 11/26/2020   Controlled type 2 diabetes mellitus with diabetic autonomic neuropathy, without long-term current use of insulin (HCC) 08/10/2007   COPD (chronic obstructive pulmonary disease) (HCC) 10/30/2016   Depression    GAD (generalized anxiety disorder)    GERD (gastroesophageal reflux disease)    Hiatal hernia with gastroesophageal reflux 1997   Hyperlipidemia    Hypertension    Osteoporosis    S/P TAVR (transcatheter aortic valve replacement) 12/21/2023   s/p TAVR with a 34 mm Evolut FX via the TF approach by Dr. Lynnette Caffey & Dr. Leafy Ro   Tobacco abuse     Tobacco Use: Social History   Tobacco Use  Smoking Status Former   Average packs/day: 0.8 packs/day for 45.0 years (33.8 ttl pk-yrs)   Types: Cigarettes   Start date: 01/2023  Smokeless Tobacco Never  Tobacco Comments   Does not on starting back smoking.    Labs: Review Flowsheet  More data exists      Latest Ref Rng & Units 11/26/2021 08/12/2022 01/28/2023 10/04/2023 12/21/2023  Labs for ITP Cardiac and Pulmonary Rehab  Cholestrol 0 - 200 mg/dL - - 440  - -  LDL (calc) 0 - 99 mg/dL - - 57  - -  HDL-C >10.27 mg/dL - - 25.36  - -  Trlycerides 0.0 - 149.0 mg/dL - - 64.4  - -  Hemoglobin A1c 4.6 - 6.5 % 6.4  6.1  6.4  - -  PH, Arterial 7.35 - 7.45 - - - 7.353  -  PCO2 arterial 32 - 48 mmHg - - - 44.6  -  Bicarbonate 20.0 - 28.0 mmol/L - - - 25.2  24.8  -  TCO2 22 - 32 mmol/L - - - 27  26  26    Acid-base deficit 0.0 - 2.0 mmol/L - - - 1.0  1.0  -  O2 Saturation % - - - 69  95  -    Details       Multiple values from one day are sorted in reverse-chronological order          Exercise Target  Goals: Exercise Program Goal: Individual exercise prescription set using results from initial 6 min walk test and THRR while considering  patient's activity barriers and safety.   Exercise Prescription  Goal: Initial exercise prescription builds to 30-45 minutes a day of aerobic activity, 2-3 days per week.  Home exercise guidelines will be given to patient during program as part of exercise prescription that the participant will acknowledge.   Education: Aerobic Exercise: - Group verbal and visual presentation on the components of exercise prescription. Introduces F.I.T.T principle from ACSM for exercise prescriptions.  Reviews F.I.T.T. principles of aerobic exercise including progression. Written material given at graduation.   Education: Resistance Exercise: - Group verbal and visual presentation on the components of exercise prescription. Introduces F.I.T.T principle from ACSM for exercise prescriptions  Reviews F.I.T.T. principles of resistance exercise including progression. Written material given at graduation.    Education: Exercise & Equipment Safety: - Individual verbal instruction and demonstration of equipment use and safety with use of the equipment. Flowsheet Row Cardiac Rehab from 02/07/2024 in [facility] Cardiac and Pulmonary Rehab  Date 02/07/24  Educator [facility]  Instruction Review Code 1- Verbalizes Understanding       Education: Exercise Physiology & General Exercise Guidelines: - Group verbal and written instruction with models to review the exercise physiology of the cardiovascular system and associated critical values. Provides general exercise guidelines with specific guidelines to those with heart or lung disease.    Education: Flexibility, Balance, Mind/Body Relaxation: - Group verbal and visual presentation with interactive activity on the components of exercise prescription. Introduces F.I.T.T principle from ACSM for exercise prescriptions. Reviews F.I.T.T. principles of  flexibility and balance exercise training including progression. Also discusses the mind body connection.  Reviews various relaxation techniques to help reduce and manage stress (i.e. Deep breathing, progressive muscle relaxation, and visualization). Balance handout provided to take home. Written material given at graduation.   Activity Barriers & Risk Stratification:  Activity Barriers & Cardiac Risk Stratification - 02/21/24 1558       Activity Barriers & Cardiac Risk Stratification   Activity Barriers History of Falls;Balance Concerns;Left Knee Replacement;Right Knee Replacement;Back Problems;Neck/Spine Problems;Muscular Weakness;Deconditioning;Assistive Device    Cardiac Risk Stratification Low             6 Minute Walk:  6 Minute Walk     Row Name 02/21/24 1557         6 Minute Walk   Phase Initial     Distance 580 feet     Walk Time 6 minutes     # of Rest Breaks 0     MPH 1.1     METS 1     RPE 11     Perceived Dyspnea  1     VO2 Peak 3.21     Symptoms Yes (comment)     Comments leg fatigue, used walker     Resting HR 65 bpm     Resting BP 102/56     Resting Oxygen Saturation  96 %     Exercise Oxygen Saturation  during 6 min walk 94 %     Max Ex. HR 96 bpm     Max Ex. BP 138/70     2 Minute Post BP 134/68              Oxygen Initial Assessment:   Oxygen Re-Evaluation:   Oxygen Discharge (Final Oxygen Re-Evaluation):   Initial Exercise Prescription:  Initial Exercise Prescription - 02/21/24 1600       Date of Initial Exercise RX and Referring Provider   Date 02/21/24    Referring Provider Dr. Lorine Bears, MD      Oxygen   Maintain Oxygen  Saturation 88% or higher      Recumbant Bike   Level 1    RPM 50    Watts 15    Minutes 15    METs 1      NuStep   Level 1    SPM 80    Minutes 15    METs 1      Track   Laps 15    Minutes 15    METs 1.82      Prescription Details   Frequency (times per week) 3    Duration Progress to  30 minutes of continuous aerobic without signs/symptoms of physical distress      Intensity   THRR 40-80% of Max Heartrate 97-130    Ratings of Perceived Exertion 11-13    Perceived Dyspnea 0-4      Progression   Progression Continue to progress workloads to maintain intensity without signs/symptoms of physical distress.      Resistance Training   Training Prescription Yes    Weight 3 lb    Reps 10-15             Perform Capillary Blood Glucose checks as needed.  Exercise Prescription Changes:   Exercise Prescription Changes     Row Name 02/21/24 1600             Response to Exercise   Blood Pressure (Admit) 102/56       Blood Pressure (Exercise) 138/70       Blood Pressure (Exit) 134/68       Heart Rate (Admit) 65 bpm       Heart Rate (Exercise) 96 bpm       Heart Rate (Exit) 78 bpm       Oxygen Saturation (Admit) 96 %       Oxygen Saturation (Exercise) 94 %       Rating of Perceived Exertion (Exercise) 11       Perceived Dyspnea (Exercise) 1       Symptoms leg fatgue, used walker       Comments Results                Exercise Comments:   Exercise Goals and Review:   Exercise Goals     Row Name 02/21/24 1558             Exercise Goals   Increase Physical Activity Yes       Intervention Provide advice, education, support and counseling about physical activity/exercise needs.;Develop an individualized exercise prescription for aerobic and resistive training based on initial evaluation findings, risk stratification, comorbidities and participant's personal goals.       Expected Outcomes Short Term: Attend rehab on a regular basis to increase amount of physical activity.;Long Term: Exercising regularly at least 3-5 days a week.;Long Term: Add in home exercise to make exercise part of routine and to increase amount of physical activity.       Increase Strength and Stamina Yes       Intervention Provide advice, education, support and counseling  about physical activity/exercise needs.;Develop an individualized exercise prescription for aerobic and resistive training based on initial evaluation findings, risk stratification, comorbidities and participant's personal goals.       Expected Outcomes Short Term: Increase workloads from initial exercise prescription for resistance, speed, and METs.;Short Term: Perform resistance training exercises routinely during rehab and add in resistance training at home;Long Term: Improve cardiorespiratory fitness, muscular endurance and strength as measured by increased METs and functional  capacity ( )       Able to understand and use rate of perceived exertion (RPE) scale Yes       Intervention Provide education and explanation on how to use RPE scale       Expected Outcomes Short Term: Able to use RPE daily in rehab to express subjective intensity level;Long Term:  Able to use RPE to guide intensity level when exercising independently       Able to understand and use Dyspnea scale Yes       Intervention Provide education and explanation on how to use Dyspnea scale       Expected Outcomes Short Term: Able to use Dyspnea scale daily in rehab to express subjective sense of shortness of breath during exertion;Long Term: Able to use Dyspnea scale to guide intensity level when exercising independently       Knowledge and understanding of Target Heart Rate Range (THRR) Yes       Intervention Provide education and explanation of THRR including how the numbers were predicted and where they are located for reference       Expected Outcomes Short Term: Able to state/look up THRR;Long Term: Able to use THRR to govern intensity when exercising independently;Short Term: Able to use daily as guideline for intensity in rehab       Able to check pulse independently Yes       Intervention Provide education and demonstration on how to check pulse in carotid and radial arteries.;Review the importance of being able to check your  own pulse for safety during independent exercise       Expected Outcomes Short Term: Able to explain why pulse checking is important during independent exercise;Long Term: Able to check pulse independently and accurately       Understanding of Exercise Prescription Yes       Intervention Provide education, explanation, and written materials on patient's individual exercise prescription       Expected Outcomes Short Term: Able to explain program exercise prescription;Long Term: Able to explain home exercise prescription to exercise independently                Exercise Goals Re-Evaluation :   Discharge Exercise Prescription (Final Exercise Prescription Changes):  Exercise Prescription Changes - 02/21/24 1600       Response to Exercise   Blood Pressure (Admit) 102/56    Blood Pressure (Exercise) 138/70    Blood Pressure (Exit) 134/68    Heart Rate (Admit) 65 bpm    Heart Rate (Exercise) 96 bpm    Heart Rate (Exit) 78 bpm    Oxygen Saturation (Admit) 96 %    Oxygen Saturation (Exercise) 94 %    Rating of Perceived Exertion (Exercise) 11    Perceived Dyspnea (Exercise) 1    Symptoms leg fatgue, used walker    Comments Results             Nutrition:  Target Goals: Understanding of nutrition guidelines, daily intake of sodium 1500mg , cholesterol 200mg , calories 30% from fat and 7% or less from saturated fats, daily to have 5 or more servings of fruits and vegetables.  Education: All About Nutrition: -Group instruction provided by verbal, written material, interactive activities, discussions, models, and posters to present general guidelines for heart healthy nutrition including fat, fiber, MyPlate, the role of sodium in heart healthy nutrition, utilization of the nutrition label, and utilization of this knowledge for meal planning. Follow up email sent as well. Written material given at  graduation.   Biometrics:  Pre Biometrics - 02/21/24 1559       Pre Biometrics    Height 5' 6.5" (1.689 m)    Weight 235 lb 1.6 oz (106.6 kg)    Waist Circumference 47.5 inches    Hip Circumference 53 inches    Waist to Hip Ratio 0.9 %    BMI (Calculated) 37.38    Single Leg Stand 0 seconds   unable to complete             Nutrition Therapy Plan and Nutrition Goals:  Nutrition Therapy & Goals - 02/21/24 1606       Intervention Plan   Intervention Prescribe, educate and counsel regarding individualized specific dietary modifications aiming towards targeted core components such as weight, hypertension, lipid management, diabetes, heart failure and other comorbidities.    Expected Outcomes Short Term Goal: Understand basic principles of dietary content, such as calories, fat, sodium, cholesterol and nutrients.;Long Term Goal: Adherence to prescribed nutrition plan.;Short Term Goal: A plan has been developed with personal nutrition goals set during dietitian appointment.             Nutrition Assessments:  MEDIFICTS Score Key: >=70 Need to make dietary changes  40-70 Heart Healthy Diet <= 40 Therapeutic Level Cholesterol Diet   Picture Your Plate Scores: <16 Unhealthy dietary pattern with much room for improvement. 41-50 Dietary pattern unlikely to meet recommendations for good health and room for improvement. 51-60 More healthful dietary pattern, with some room for improvement.  >60 Healthy dietary pattern, although there may be some specific behaviors that could be improved.    Nutrition Goals Re-Evaluation:   Nutrition Goals Discharge (Final Nutrition Goals Re-Evaluation):   Psychosocial: Target Goals: Acknowledge presence or absence of significant depression and/or stress, maximize coping skills, provide positive support system. Participant is able to verbalize types and ability to use techniques and skills needed for reducing stress and depression.   Education: Stress, Anxiety, and Depression - Group verbal and visual presentation to  define topics covered.  Reviews how body is impacted by stress, anxiety, and depression.  Also discusses healthy ways to reduce stress and to treat/manage anxiety and depression.  Written material given at graduation.   Education: Sleep Hygiene -Provides group verbal and written instruction about how sleep can affect your health.  Define sleep hygiene, discuss sleep cycles and impact of sleep habits. Review good sleep hygiene tips.    Initial Review & Psychosocial Screening:  Initial Psych Review & Screening - 02/07/24 1207       Initial Review   Current issues with Current Depression;History of Depression;Current Anxiety/Panic;Current Psychotropic Meds      Family Dynamics   Good Support System? Yes    Comments Carilyn has been on medication for years for her depression and anxiety. Her husband died in 11-08-24 and she had her health delice with her valve replacement. She can look to her daughter and son for support.      Barriers   Psychosocial barriers to participate in program The patient should benefit from training in stress management and relaxation.;There are no identifiable barriers or psychosocial needs.      Screening Interventions   Interventions Provide feedback about the scores to participant;To provide support and resources with identified psychosocial needs;Encouraged to exercise    Expected Outcomes Short Term goal: Utilizing psychosocial counselor, staff and physician to assist with identification of specific Stressors or current issues interfering with healing process. Setting desired goal for each stressor or current  issue identified.;Long Term Goal: Stressors or current issues are controlled or eliminated.;Short Term goal: Identification and review with participant of any Quality of Life or Depression concerns found by scoring the questionnaire.;Long Term goal: The participant improves quality of Life and PHQ9 Scores as seen by post scores and/or verbalization of changes              Quality of Life Scores:   Scores of 19 and below usually indicate a poorer quality of life in these areas.  A difference of  2-3 points is a clinically meaningful difference.  A difference of 2-3 points in the total score of the Quality of Life Index has been associated with significant improvement in overall quality of life, self-image, physical symptoms, and general health in studies assessing change in quality of life.  PHQ-9: Review Flowsheet  More data exists      02/21/2024 01/28/2023 12/18/2022 12/17/2021 12/16/2021  Depression screen PHQ 2/9  Decreased Interest 3 3 2  0 1 0  Down, Depressed, Hopeless 3 3 3  0 2 0  PHQ - 2 Score 6 6 5  0 3 0  Altered sleeping 3 3 3  - 2 -  Tired, decreased energy 3 3 3  - 3 -  Change in appetite 3 3 3  - 2 -  Feeling bad or failure about yourself  2 3 1  - 1 -  Trouble concentrating 3 3 3  - 3 -  Moving slowly or fidgety/restless 3 0 2 - 3 -  Suicidal thoughts 0 0 0 - 0 -  PHQ-9 Score 23 21 20  - 17 -  Difficult doing work/chores Extremely dIfficult Very difficult Very difficult - Somewhat difficult -    Details       Multiple values from one day are sorted in reverse-chronological order        Interpretation of Total Score  Total Score Depression Severity:  1-4 = Minimal depression, 5-9 = Mild depression, 10-14 = Moderate depression, 15-19 = Moderately severe depression, 20-27 = Severe depression   Psychosocial Evaluation and Intervention:  Psychosocial Evaluation - 02/07/24 1209       Psychosocial Evaluation & Interventions   Interventions Encouraged to exercise with the program and follow exercise prescription;Relaxation education;Stress management education    Comments Luvina has been on medication for years for her depression and anxiety. Her husband died in 11/05/2024 and she had her health delice with her valve replacement. She can look to her daughter and son for support.    Expected Outcomes Short: Attend [facility]  stress management education to decrease stress. Long: Maintain exercise Post [facility] to keep stress at a minimum.    Continue Psychosocial Services  Follow up required by staff             Psychosocial Re-Evaluation:   Psychosocial Discharge (Final Psychosocial Re-Evaluation):   Vocational Rehabilitation: Provide vocational rehab assistance to qualifying candidates.   Vocational Rehab Evaluation & Intervention:   Education: Education Goals: Education classes will be provided on a variety of topics geared toward better understanding of heart health and risk factor modification. Participant will state understanding/return demonstration of topics presented as noted by education test scores.  Learning Barriers/Preferences:  Learning Barriers/Preferences - 02/07/24 1206       Learning Barriers/Preferences   Learning Barriers Sight    Learning Preferences None             General Cardiac Education Topics:  AED/CPR: - Group verbal and written instruction with the use of models to demonstrate  the basic use of the AED with the basic ABC's of resuscitation.   Anatomy and Cardiac Procedures: - Group verbal and visual presentation and models provide information about basic cardiac anatomy and function. Reviews the testing methods done to diagnose heart disease and the outcomes of the test results. Describes the treatment choices: Medical Management, Angioplasty, or Coronary Bypass Surgery for treating various heart conditions including Myocardial Infarction, Angina, Valve Disease, and Cardiac Arrhythmias.  Written material given at graduation.   Medication Safety: - Group verbal and visual instruction to review commonly prescribed medications for heart and lung disease. Reviews the medication, class of the drug, and side effects. Includes the steps to properly store meds and maintain the prescription regimen.  Written material given at graduation.   Intimacy: - Group  verbal instruction through game format to discuss how heart and lung disease can affect sexual intimacy. Written material given at graduation..   Know Your Numbers and Heart Failure: - Group verbal and visual instruction to discuss disease risk factors for cardiac and pulmonary disease and treatment options.  Reviews associated critical values for Overweight/Obesity, Hypertension, Cholesterol, and Diabetes.  Discusses basics of heart failure: signs/symptoms and treatments.  Introduces Heart Failure Zone chart for action plan for heart failure.  Written material given at graduation.   Infection Prevention: - Provides verbal and written material to individual with discussion of infection control including proper hand washing and proper equipment cleaning during exercise session. Flowsheet Row Cardiac Rehab from 02/07/2024 in [facility] Cardiac and Pulmonary Rehab  Date 02/07/24  Educator [facility]  Instruction Review Code 1- Verbalizes Understanding       Falls Prevention: - Provides verbal and written material to individual with discussion of falls prevention and safety. Flowsheet Row Cardiac Rehab from 02/07/2024 in [facility] Cardiac and Pulmonary Rehab  Date 02/07/24  Educator [facility]  Instruction Review Code 1- Verbalizes Understanding       Other: -Provides group and verbal instruction on various topics (see comments)   Knowledge Questionnaire Score:   Core Components/Risk Factors/Patient Goals at Admission:  Personal Goals and Risk Factors at Admission - 02/07/24 1206       Core Components/Risk Factors/Patient Goals on Admission    Weight Management Yes;Weight Loss    Intervention Weight Management: Develop a combined nutrition and exercise program designed to reach desired caloric intake, while maintaining appropriate intake of nutrient and fiber, sodium and fats, and appropriate energy expenditure required for the weight goal.;Weight Management: Provide education and appropriate resources to help  participant work on and attain dietary goals.;Weight Management/Obesity: Establish reasonable short term and long term weight goals.    Expected Outcomes Short Term: Continue to assess and modify interventions until short term weight is achieved;Weight Maintenance: Understanding of the daily nutrition guidelines, which includes 25-35% calories from fat, 7% or less cal from saturated fats, less than 200mg  cholesterol, less than 1.5gm of sodium, & 5 or more servings of fruits and vegetables daily;Weight Loss: Understanding of general recommendations for a balanced deficit meal plan, which promotes 1-2 lb weight loss per week and includes a negative energy balance of [phone] kcal/d;Understanding recommendations for meals to include 15-35% energy as protein, 25-35% energy from fat, 35-60% energy from carbohydrates, less than 200mg  of dietary cholesterol, 20-35 gm of total fiber daily;Understanding of distribution of calorie intake throughout the day with the consumption of 4-5 meals/snacks    Tobacco Cessation Yes    Number of packs per day 0    Intervention Assist the participant in steps  to quit. Provide individualized education and counseling about committing to Tobacco Cessation, relapse prevention, and pharmacological support that can be provided by physician.;Education officer, environmental, assist with locating and accessing local/national Quit Smoking programs, and support quit date choice.    Expected Outcomes Short Term: Will demonstrate readiness to quit, by selecting a quit date.;Long Term: Complete abstinence from all tobacco products for at least 12 months from quit date.;Short Term: Will quit all tobacco product use, adhering to prevention of relapse plan.    Diabetes Yes    Intervention Provide education about signs/symptoms and action to take for hypo/hyperglycemia.;Provide education about proper nutrition, including hydration, and aerobic/resistive exercise prescription along with prescribed  medications to achieve blood glucose in normal ranges: Fasting glucose 65-99 mg/dL    Expected Outcomes Short Term: Participant verbalizes understanding of the signs/symptoms and immediate care of hyper/hypoglycemia, proper foot care and importance of medication, aerobic/resistive exercise and nutrition plan for blood glucose control.;Long Term: Attainment of HbA1C < 7%.    Hypertension Yes    Intervention Provide education on lifestyle modifcations including regular physical activity/exercise, weight management, moderate sodium restriction and increased consumption of fresh fruit, vegetables, and low fat dairy, alcohol moderation, and smoking cessation.;Monitor prescription use compliance.    Expected Outcomes Long Term: Maintenance of blood pressure at goal levels.;Short Term: Continued assessment and intervention until BP is < 140/71mm HG in hypertensive participants. < 130/67mm HG in hypertensive participants with diabetes, heart failure or chronic kidney disease.    Lipids Yes    Intervention Provide education and support for participant on nutrition & aerobic/resistive exercise along with prescribed medications to achieve LDL 70mg , HDL >40mg .    Expected Outcomes Short Term: Participant states understanding of desired cholesterol values and is compliant with medications prescribed. Participant is following exercise prescription and nutrition guidelines.;Long Term: Cholesterol controlled with medications as prescribed, with individualized exercise RX and with personalized nutrition plan. Value goals: LDL < 70mg , HDL > 40 mg.             Education:Diabetes - Individual verbal and written instruction to review signs/symptoms of diabetes, desired ranges of glucose level fasting, after meals and with exercise. Acknowledge that pre and post exercise glucose checks will be done for 3 sessions at entry of program. Flowsheet Row Cardiac Rehab from 02/07/2024 in [facility] Cardiac and Pulmonary Rehab  Date  02/07/24  Educator [facility]  Instruction Review Code 1- Verbalizes Understanding       Core Components/Risk Factors/Patient Goals Review:    Core Components/Risk Factors/Patient Goals at Discharge (Final Review):    ITP Comments:  ITP Comments     Row Name 02/07/24 1205 02/21/24 1553         ITP Comments Virtual Visit completed. Patient informed on EP and RD appointment and 6 Minute walk test. Patient also informed of patient health questionnaires on My Chart. Patient Verbalizes understanding. Visit diagnosis can be found in [facility] 12/21/2023. Completed and gym orientation. Initial ITP created and sent for review to Dr. Bethann Punches, Medical Director.               Comments: Initial ITP

## 2024-02-22 ENCOUNTER — Encounter: Payer: Self-pay | Admitting: Family Medicine

## 2024-02-22 ENCOUNTER — Ambulatory Visit: Payer: Medicare Other | Admitting: Cardiovascular Disease

## 2024-02-23 ENCOUNTER — Encounter: Admitting: *Deleted

## 2024-02-23 ENCOUNTER — Encounter

## 2024-02-23 DIAGNOSIS — Z952 Presence of prosthetic heart valve: Secondary | ICD-10-CM | POA: Diagnosis not present

## 2024-02-23 DIAGNOSIS — Z48812 Encounter for surgical aftercare following surgery on the circulatory system: Secondary | ICD-10-CM | POA: Diagnosis not present

## 2024-02-23 DIAGNOSIS — Z79899 Other long term (current) drug therapy: Secondary | ICD-10-CM | POA: Diagnosis not present

## 2024-02-23 MED ORDER — FLUTICASONE-SALMETEROL 250-50 MCG/ACT IN AEPB: 1.0000 | INHALATION_SPRAY | Freq: Two times a day (BID) | RESPIRATORY_TRACT | 3 refills | Status: DC

## 2024-02-23 NOTE — Progress Notes (Signed)
 Assessment start time: 2:47 PM  Digestive issues/concerns: no known food allergies   Education r/t nutrition plan Patient drinking mostly water, she has DM and doesn't drink any sugary beverages. She is looking to lose weight and eat heart healthy. She used to make meals for her and her husband that were made of a protein, a carb, and a veggie. When her husband passed in late November she received lots of food, then with the holiday she got more. Her freezer was full and she has been eating out of it for the past few months. But now that she is close to finishing off that food, she is having to adjust to her new routine living with her daughter and new kitchen appliances. Provided counseling, support and encouragement. Reviewed a few facts labels and brainstormed ways to get back into healthier eating centered around more veggies and less high calorie foods. She likes veggies and says she could work on eating more. Discussed building plates for her and her daughter with more low calorie foods and still having rooms for some of their favorites like pizza and venison burgers.      Goal 1: Make plates with more veggies, make daughter healthy plates Goal 2: Read labels and reduce sodium intake to below 2300mg . Ideally 1500mg  per day. Goal 3: Eat 15-30gProtein and 30-60gCarbs at each meal.  End time 3:31 PM

## 2024-02-23 NOTE — Progress Notes (Signed)
 Daily Session Note  Patient Details  Name: Amanda [name] MRN: [id number] Date of Birth: 1951-10-15 Referring Provider:   Flowsheet Row Cardiac Rehab from 02/21/2024 in [facility] Cardiac and Pulmonary Rehab  Referring Provider Dr. Lorine Bears, MD       Encounter Date: 02/23/2024  Check In:  Session Check In - 02/23/24 1406       Check-In   Supervising physician immediately available to respond to emergencies See telemetry face sheet for immediately available ER MD    Location [facility]-Cardiac & Pulmonary Rehab    Staff Present Susann Givens RN,BSN;Susanne Bice, RN, BSN, CCRP;Margaret Best, MS, Exercise Physiologist;Noah Tickle, BS, Exercise Physiologist;Kelly Cloretta Ned, ACSM CEP, Exercise Physiologist    Virtual Visit No    Medication changes reported     No    Fall or balance concerns reported    No    Warm-up and Cool-down Performed on first and last piece of equipment    Resistance Training Performed Yes    VAD Patient? No    PAD/SET Patient? No      Pain Assessment   Currently in Pain? No/denies                Social History   Tobacco Use  Smoking Status Former   Average packs/day: 0.8 packs/day for 45.0 years (33.8 ttl pk-yrs)   Types: Cigarettes   Start date: 01/2023  Smokeless Tobacco Never  Tobacco Comments   Does not on starting back smoking.    Goals Met:  Independence with exercise equipment Exercise tolerated well No report of concerns or symptoms today Strength training completed today  Goals Unmet:  Not Applicable  Comments: Pt able to follow exercise prescription today without complaint.  Will continue to monitor for progression.    Dr. Bethann Punches is Medical Director for [facility] Cardiac Rehabilitation.  Dr. Vida Rigger is Medical Director for [facility] Pulmonary Rehabilitation.

## 2024-02-24 ENCOUNTER — Encounter: Admitting: *Deleted

## 2024-02-24 DIAGNOSIS — Z952 Presence of prosthetic heart valve: Secondary | ICD-10-CM

## 2024-02-24 DIAGNOSIS — Z48812 Encounter for surgical aftercare following surgery on the circulatory system: Secondary | ICD-10-CM | POA: Diagnosis not present

## 2024-02-24 DIAGNOSIS — Z79899 Other long term (current) drug therapy: Secondary | ICD-10-CM | POA: Diagnosis not present

## 2024-02-24 NOTE — Progress Notes (Signed)
 Daily Session Note  Patient Details  Name: Amanda [name] MRN: [id number] Date of Birth: 05/20/1951 Referring Provider:   Flowsheet Row Cardiac Rehab from 02/21/2024 in [facility] Cardiac and Pulmonary Rehab  Referring Provider Dr. Lorine Bears, MD       Encounter Date: 02/24/2024  Check In:  Session Check In - 02/24/24 1350       Check-In   Supervising physician immediately available to respond to emergencies See telemetry face sheet for immediately available ER MD    Location [facility]-Cardiac & Pulmonary Rehab    Staff Present Susann Givens RN,BSN;Joseph [facility] BS, Exercise Physiologist    Virtual Visit No    Medication changes reported     No    Fall or balance concerns reported    No    Tobacco Cessation No Change    Current number of cigarettes/nicotine per day     0    Warm-up and Cool-down Performed on first and last piece of equipment    Resistance Training Performed Yes    VAD Patient? No    PAD/SET Patient? No      Pain Assessment   Currently in Pain? No/denies                Social History   Tobacco Use  Smoking Status Former   Average packs/day: 0.8 packs/day for 45.0 years (33.8 ttl pk-yrs)   Types: Cigarettes   Start date: 01/2023  Smokeless Tobacco Never  Tobacco Comments   Does not on starting back smoking.    Goals Met:  Independence with exercise equipment Exercise tolerated well No report of concerns or symptoms today Strength training completed today  Goals Unmet:  Not Applicable  Comments: Pt able to follow exercise prescription today without complaint.  Will continue to monitor for progression.    Dr. Bethann Punches is Medical Director for [facility] Cardiac Rehabilitation.  Dr. Vida Rigger is Medical Director for [facility] Pulmonary Rehabilitation.

## 2024-02-26 ENCOUNTER — Other Ambulatory Visit: Payer: Self-pay | Admitting: Family Medicine

## 2024-02-27 ENCOUNTER — Emergency Department

## 2024-02-27 ENCOUNTER — Emergency Department
Admission: EM | Admit: 2024-02-27 | Discharge: 2024-02-27 | Disposition: A | Discharge status: Home / Self Care | Attending: Emergency Medicine | Admitting: Emergency Medicine

## 2024-02-27 DIAGNOSIS — I2510 Atherosclerotic heart disease of native coronary artery without angina pectoris: Secondary | ICD-10-CM | POA: Insufficient documentation

## 2024-02-27 DIAGNOSIS — F0390 Unspecified dementia without behavioral disturbance: Secondary | ICD-10-CM | POA: Insufficient documentation

## 2024-02-27 DIAGNOSIS — Z043 Encounter for examination and observation following other accident: Secondary | ICD-10-CM | POA: Diagnosis not present

## 2024-02-27 DIAGNOSIS — E119 Type 2 diabetes mellitus without complications: Secondary | ICD-10-CM | POA: Diagnosis not present

## 2024-02-27 DIAGNOSIS — M25551 Pain in right hip: Secondary | ICD-10-CM | POA: Insufficient documentation

## 2024-02-27 DIAGNOSIS — I10 Essential (primary) hypertension: Secondary | ICD-10-CM | POA: Insufficient documentation

## 2024-02-27 DIAGNOSIS — S0083XA Contusion of other part of head, initial encounter: Secondary | ICD-10-CM | POA: Diagnosis not present

## 2024-02-27 DIAGNOSIS — S0990XA Unspecified injury of head, initial encounter: Secondary | ICD-10-CM | POA: Diagnosis present

## 2024-02-27 DIAGNOSIS — Z981 Arthrodesis status: Secondary | ICD-10-CM | POA: Diagnosis not present

## 2024-02-27 DIAGNOSIS — W01198A Fall on same level from slipping, tripping and stumbling with subsequent striking against other object, initial encounter: Secondary | ICD-10-CM | POA: Diagnosis not present

## 2024-02-27 DIAGNOSIS — W19XXXA Unspecified fall, initial encounter: Secondary | ICD-10-CM

## 2024-02-27 DIAGNOSIS — M51369 Other intervertebral disc degeneration, lumbar region without mention of lumbar back pain or lower extremity pain: Secondary | ICD-10-CM | POA: Diagnosis not present

## 2024-02-27 DIAGNOSIS — M47816 Spondylosis without myelopathy or radiculopathy, lumbar region: Secondary | ICD-10-CM | POA: Diagnosis not present

## 2024-02-27 DIAGNOSIS — R9082 White matter disease, unspecified: Secondary | ICD-10-CM | POA: Diagnosis not present

## 2024-02-27 NOTE — ED Notes (Signed)
 Patient ambulated from department with a slow, steady gait and use of rollator.

## 2024-02-27 NOTE — ED Provider Notes (Signed)
 [facility] Provider Note    Event Date/Time   First MD Initiated Contact with Patient 02/27/24 1553     (approximate)   History   Fall   HPI  Amanda [name] is a 73 y.o. female with PMH of hypertension, osteoporosis, CAD, aortic stenosis s/p TAVR, diabetes, dementia who presents for evaluation after fall.  Patient states she was bent over when she lost her balance causing her to fall forward and hit her head on the [place] cabinet.  She landed on her right hip.  She reports some pain to the right hip but has been able to walk since.  Patient did not lose consciousness, no vomiting, no blood thinners.  She does report a mild headache.      Physical Exam   Triage Vital Signs: ED Triage Vitals  Encounter Vitals Group     BP 02/27/24 1502 135/70     Systolic BP Percentile --      Diastolic BP Percentile --      Pulse Rate 02/27/24 1502 93     Resp 02/27/24 1502 18     Temp 02/27/24 1502 98.2 F (36.8 C)     Temp Source 02/27/24 1502 Oral     SpO2 02/27/24 1502 95 %     Weight 02/27/24 1505 235 lb (106.6 kg)     Height 02/27/24 1505 5' 6.5" (1.689 m)     Head Circumference --      Peak Flow --      Pain Score 02/27/24 1504 8     Pain Loc --      Pain Education --      Exclude from Growth Chart --     Most recent vital signs: Vitals:   02/27/24 1502  BP: 135/70  Pulse: 93  Resp: 18  Temp: 98.2 F (36.8 C)  SpO2: 95%   General: Awake, no distress.  CV:  Good peripheral perfusion.  RRR. Resp:  Normal effort.  CTAB. Abd:  No distention.  Other:  Mild tenderness to palpation over the right hip.  Dorsalis pedis pulses are 2+ and regular bilaterally.  Sensation intact across all dermatomes of the foot.  5/5 strength in the lower extremity.  No focal neurodeficits.  PERRL.  No ataxia.  Mild swelling to the left forehead with some overlying bruising.   ED Results / Procedures / Treatments   Labs (all labs ordered are listed, but only  abnormal results are displayed) Labs Reviewed - No data to display   RADIOLOGY  CT head, CT neck and right hip x-ray obtained, I interpreted the images as well as reviewed the radiologist report.  CT of the head and neck negative for any acute abnormalities aside from a soft tissue contusion to the left forehead.  Hip x-ray is negative for acute abnormalities   PROCEDURES:  Critical Care performed: No  Procedures   MEDICATIONS ORDERED IN ED: Medications - No data to display   IMPRESSION / MDM / ASSESSMENT AND PLAN / ED COURSE  I reviewed the triage vital signs and the nursing notes.                             73 year old female presents for evaluation after a fall. VSS and patient NAD on exam.  Differential diagnosis includes, but is not limited to, closed head injury, contusion, intracranial bleed, skull fracture, hip fracture, muscle strain.  Patient's presentation  is most consistent with acute complicated illness / injury requiring diagnostic workup.  CT head, CT cervical spine and right hip x-ray all negative for acute abnormalities.  Physical exam is reassuring.  Patient did not have any focal neurodeficits.  Patient has some mild tenderness to the right hip and has a headache but is otherwise without pain.  Patient was offered pain medication while in the ED which she declined.  Given the negative imaging I feel she stable to go home.  Discussed return precautions.  She voiced understanding, all questions were answered and she was stable at discharge.     FINAL CLINICAL IMPRESSION(S) / ED DIAGNOSES   Final diagnoses:  Fall, initial encounter     Rx / DC Orders   ED Discharge Orders     None        Note:  This document was prepared using Dragon voice recognition software and may include unintentional dictation errors.   Cameron Ali, PA-C 02/27/24 1624    Minna Antis, MD 02/28/24 1758

## 2024-02-27 NOTE — Discharge Instructions (Signed)
 You can take Tylenol as needed for pain.  You may continue to apply ice over your forehead to help with the swelling.  Return to the emergency department if you have worsening headache, multiple episodes of vomiting, blurry vision or any other worsening symptoms.

## 2024-02-27 NOTE — ED Triage Notes (Signed)
 Pt presents to the ED POV from home after fall. Pt was bending over when she lost her balance and hit her head on the [place] cabinet. Pt A&Ox4. Pt does have dementia per daughter. Pt does not take a blood thinner and denies LOC. Hematoma noted to left side of forehead. Per daughter pt is acting normally. Pt does report right hip pain as well after landing on right hip. Pupils round, reactive and equal. Ambulatory using walker.

## 2024-02-28 ENCOUNTER — Encounter: Admitting: *Deleted

## 2024-02-28 DIAGNOSIS — Z952 Presence of prosthetic heart valve: Secondary | ICD-10-CM

## 2024-02-28 DIAGNOSIS — Z48812 Encounter for surgical aftercare following surgery on the circulatory system: Secondary | ICD-10-CM | POA: Diagnosis not present

## 2024-02-28 DIAGNOSIS — Z79899 Other long term (current) drug therapy: Secondary | ICD-10-CM | POA: Diagnosis not present

## 2024-02-28 NOTE — Progress Notes (Signed)
 Daily Session Note  Patient Details  Name: Amanda [name] MRN: [id number] Date of Birth: [birth year] Referring Provider:   Flowsheet Row Cardiac Rehab from 02/21/2024 in [facility] Cardiac and Pulmonary Rehab  Referring Provider Dr. Lorine Bears, MD       Encounter Date: 02/28/2024  Check In:  Session Check In - 02/28/24 1345       Check-In   Supervising physician immediately available to respond to emergencies See telemetry face sheet for immediately available ER MD    Location [facility]-Cardiac & Pulmonary Rehab    Staff Present Elige Ko RCP,RRT,BSRT;Quisha Mabie Jewel Baize RN,BSN;Maxon Conetta BS, Exercise Physiologist;Noah Tickle, BS, Exercise Physiologist    Virtual Visit No    Medication changes reported     No    Fall or balance concerns reported    Yes    Comments lost balance yesterday and hit head on [place] cabinet then landed on hip, cleared by ED.    Tobacco Cessation No Change    Warm-up and Cool-down Performed on first and last piece of equipment    Resistance Training Performed Yes    VAD Patient? No    PAD/SET Patient? No      Pain Assessment   Currently in Pain? No/denies                Social History   Tobacco Use  Smoking Status Former   Average packs/day: 0.8 packs/day for 45.0 years (33.8 ttl pk-yrs)   Types: Cigarettes   Start date: 01/2023  Smokeless Tobacco Never  Tobacco Comments   Does not on starting back smoking.    Goals Met:  Independence with exercise equipment Exercise tolerated well No report of concerns or symptoms today Strength training completed today  Goals Unmet:  Not Applicable  Comments: Pt able to follow exercise prescription today without complaint.  Will continue to monitor for progression.    Dr. Bethann Punches is Medical Director for [facility] Cardiac Rehabilitation.  Dr. Vida Rigger is Medical Director for [facility] Pulmonary Rehabilitation.

## 2024-02-29 DIAGNOSIS — R2689 Other abnormalities of gait and mobility: Secondary | ICD-10-CM | POA: Diagnosis not present

## 2024-02-29 DIAGNOSIS — G629 Polyneuropathy, unspecified: Secondary | ICD-10-CM | POA: Diagnosis not present

## 2024-03-01 ENCOUNTER — Encounter

## 2024-03-02 ENCOUNTER — Encounter

## 2024-03-06 ENCOUNTER — Encounter

## 2024-03-08 ENCOUNTER — Encounter: Attending: Cardiovascular Disease | Admitting: *Deleted

## 2024-03-08 DIAGNOSIS — Z952 Presence of prosthetic heart valve: Secondary | ICD-10-CM | POA: Diagnosis not present

## 2024-03-08 NOTE — Progress Notes (Signed)
 Daily Session Note  Patient Details  Name: Amanda [name] MRN: [id number] Date of Birth: October 16, 1951 Referring Provider:   Flowsheet Row Cardiac Rehab from 02/21/2024 in [facility] Cardiac and Pulmonary Rehab  Referring Provider Dr. Lorine Bears, MD       Encounter Date: 03/08/2024  Check In:  Session Check In - 03/08/24 1352       Check-In   Supervising physician immediately available to respond to emergencies See telemetry face sheet for immediately available ER MD    Location [facility]-Cardiac & Pulmonary Rehab    Staff Present Susann Givens RN,BSN;Kelly Madilyn Fireman BS, ACSM CEP, Exercise Physiologist;Jason Wallace Cullens RDN,LDN;Noah Tickle, BS, Exercise Physiologist    Virtual Visit No    Medication changes reported     No    Fall or balance concerns reported    No    Warm-up and Cool-down Performed on first and last piece of equipment    Resistance Training Performed Yes    VAD Patient? No    PAD/SET Patient? No      Pain Assessment   Currently in Pain? No/denies                Social History   Tobacco Use  Smoking Status Former   Average packs/day: 0.8 packs/day for 45.0 years (33.8 ttl pk-yrs)   Types: Cigarettes   Start date: 01/2023  Smokeless Tobacco Never  Tobacco Comments   Does not on starting back smoking.    Goals Met:  Independence with exercise equipment Exercise tolerated well No report of concerns or symptoms today Strength training completed today  Goals Unmet:  Not Applicable  Comments: Pt able to follow exercise prescription today without complaint.  Will continue to monitor for progression.    Dr. Bethann Punches is Medical Director for [facility] Cardiac Rehabilitation.  Dr. Vida Rigger is Medical Director for [facility] Pulmonary Rehabilitation.

## 2024-03-09 ENCOUNTER — Encounter: Admitting: *Deleted

## 2024-03-09 DIAGNOSIS — Z952 Presence of prosthetic heart valve: Secondary | ICD-10-CM | POA: Diagnosis not present

## 2024-03-09 NOTE — Progress Notes (Signed)
 Daily Session Note  Patient Details  Name: Amanda [name] MRN: [id number] Date of Birth: [birth year] Referring Provider:   Flowsheet Row Cardiac Rehab from 02/21/2024 in [facility] Cardiac and Pulmonary Rehab  Referring Provider Dr. Lorine Bears, MD       Encounter Date: 03/09/2024  Check In:  Session Check In - 03/09/24 1338       Check-In   Supervising physician immediately available to respond to emergencies See telemetry face sheet for immediately available ER MD    Location [facility]-Cardiac & Pulmonary Rehab    Staff Present Susann Givens RN,BSN;Joseph [facility] BS, Exercise Physiologist;Noah Tickle, BS, Exercise Physiologist    Virtual Visit No    Medication changes reported     No    Fall or balance concerns reported    No    Tobacco Cessation No Change    Warm-up and Cool-down Performed on first and last piece of equipment    Resistance Training Performed Yes    VAD Patient? No    PAD/SET Patient? No      Pain Assessment   Currently in Pain? No/denies                Social History   Tobacco Use  Smoking Status Former   Average packs/day: 0.8 packs/day for 45.0 years (33.8 ttl pk-yrs)   Types: Cigarettes   Start date: 01/2023  Smokeless Tobacco Never  Tobacco Comments   Does not on starting back smoking.    Goals Met:  Independence with exercise equipment Exercise tolerated well No report of concerns or symptoms today Strength training completed today  Goals Unmet:  Not Applicable  Comments: Pt able to follow exercise prescription today without complaint.  Will continue to monitor for progression.    Dr. Bethann Punches is Medical Director for [facility] Cardiac Rehabilitation.  Dr. Vida Rigger is Medical Director for [facility] Pulmonary Rehabilitation.

## 2024-03-13 ENCOUNTER — Encounter: Admitting: *Deleted

## 2024-03-13 DIAGNOSIS — Z952 Presence of prosthetic heart valve: Secondary | ICD-10-CM | POA: Diagnosis not present

## 2024-03-13 NOTE — Progress Notes (Signed)
 Daily Session Note  Patient Details  Name: Amanda [name] MRN: [id number] Date of Birth: Apr 09, 1951 Referring Provider:   Flowsheet Row Cardiac Rehab from 02/21/2024 in [facility] Cardiac and Pulmonary Rehab  Referring Provider Dr. Lorine Bears, MD       Encounter Date: 03/13/2024  Check In:  Session Check In - 03/13/24 1347       Check-In   Supervising physician immediately available to respond to emergencies See telemetry face sheet for immediately available ER MD    Location [facility]-Cardiac & Pulmonary Rehab    Staff Present Susann Givens RN,BSN;Susanne Bice, RN, BSN, CCRP;Maxon Conetta BS, Exercise Physiologist;Noah Tickle, BS, Exercise Physiologist    Virtual Visit No    Medication changes reported     No    Fall or balance concerns reported    No    Warm-up and Cool-down Performed on first and last piece of equipment    Resistance Training Performed Yes    VAD Patient? No    PAD/SET Patient? No      Pain Assessment   Currently in Pain? No/denies                Social History   Tobacco Use  Smoking Status Former   Average packs/day: 0.8 packs/day for 45.0 years (33.8 ttl pk-yrs)   Types: Cigarettes   Start date: 01/2023  Smokeless Tobacco Never  Tobacco Comments   Does not on starting back smoking.    Goals Met:  Independence with exercise equipment Exercise tolerated well No report of concerns or symptoms today Strength training completed today  Goals Unmet:  Not Applicable  Comments: Pt able to follow exercise prescription today without complaint.  Will continue to monitor for progression.    Dr. Bethann Punches is Medical Director for [facility] Cardiac Rehabilitation.  Dr. Vida Rigger is Medical Director for [facility] Pulmonary Rehabilitation.

## 2024-03-15 ENCOUNTER — Encounter: Admitting: *Deleted

## 2024-03-15 ENCOUNTER — Encounter: Payer: Self-pay | Admitting: *Deleted

## 2024-03-15 DIAGNOSIS — Z952 Presence of prosthetic heart valve: Secondary | ICD-10-CM

## 2024-03-15 NOTE — Progress Notes (Signed)
 Daily Session Note  Patient Details  Name: Amanda [name] MRN: [id number] Date of Birth: September 21, 1951 Referring Provider:   Flowsheet Row Cardiac Rehab from 02/21/2024 in [facility]  Referring Provider Dr. Lorine Bears, MD       Encounter Date: 03/15/2024  Check In:  Session Check In - 03/15/24 1357       Check-In   Supervising physician immediately available to respond to emergencies See telemetry face sheet for immediately available ER MD    Location [facility]-Card[facility] & Pul[facility]    Staff Present Susann Givens RN,BSN;Susanne Bice, RN, BSN, CCRP;Noah Tickle, BS, Exercise Physiologist    Virtual Visit No    Medication changes reported     No    Fall or balance concerns reported    No    Warm-up and Cool-down Performed on first and last piece of equipment    Resistance Training Performed Yes    VAD Patient? No    PAD/SET Patient? No      Pain Assessment   Currently in Pain? No/denies                Social History   Tobacco Use  Smoking Status Former   Average packs/day: 0.8 packs/day for 45.0 years (33.8 ttl pk-yrs)   Types: Cigarettes   Start date: 01/2023  Smokeless Tobacco Never  Tobacco Comments   Does not on starting back smoking.    Goals Met:  Independence with exercise equipment Exercise tolerated well No report of concerns or symptoms today Strength training completed today  Goals Unmet:  Not Applicable  Comments: Pt able to follow exercise prescription today without complaint.  Will continue to monitor for progression.    Dr. Bethann Punches is Medical Director for [facility].  Dr. Vida Rigger is Medical Director for [facility].

## 2024-03-15 NOTE — Progress Notes (Signed)
 Cardiac Individual Treatment Plan  Patient Details  Name: Amanda [name] MRN: [id number] Date of Birth: 1951-07-27 Referring Provider:   Flowsheet Row Cardiac Rehab from 02/21/2024 in [facility] Cardiac and Pulmonary Rehab  Referring Provider Dr. Lorine Bears, MD       Initial Encounter Date:  Flowsheet Row Cardiac Rehab from 02/21/2024 in [facility] Cardiac and Pulmonary Rehab  Date 02/21/24       Visit Diagnosis: S/P TAVR (transcatheter aortic valve replacement)  Patient's Home Medications on Admission:  Current Outpatient Medications:    acetaminophen (TYLENOL) 500 MG tablet, Take 1,000 mg by mouth every 8 (eight) hours as needed for mild pain (pain score 1-3) or moderate pain (pain score 4-6)., Disp: , Rfl:    albuterol (VENTOLIN HFA) 108 (90 Base) MCG/ACT inhaler, Inhale 2 puffs into the lungs every 4 (four) hours as needed for wheezing or shortness of breath., Disp: 3 each, Rfl: 2   amitriptyline (ELAVIL) 50 MG tablet, Take 0.5 tablets (25 mg total) by mouth at bedtime., Disp: 45 tablet, Rfl: 1   Ascorbic Acid (VITAMIN C) 1000 MG tablet, Take 1,000 mg by mouth daily., Disp: , Rfl:    aspirin EC 81 MG tablet, Take 81 mg by mouth daily., Disp: , Rfl:    b complex vitamins capsule, Take 1 capsule by mouth daily., Disp: , Rfl:    buPROPion (WELLBUTRIN XL) 150 MG 24 hr tablet, Take 1 tablet (150 mg total) by mouth daily., Disp: 90 tablet, Rfl: 1   calcium carbonate (TUMS - DOSED IN MG ELEMENTAL CALCIUM) 500 MG chewable tablet, Chew 2 tablets by mouth daily as needed for indigestion or heartburn., Disp: , Rfl:    Cholecalciferol (VITAMIN D) 1000 UNITS capsule, Take 2,000 Units by mouth daily., Disp: , Rfl:    clonazePAM (KLONOPIN) 0.5 MG tablet, Take 1 tablet by mouth twice daily as needed, Disp: 60 tablet, Rfl: 5   cyanocobalamin (VITAMIN B12) 1000 MCG/ML injection, Inject into the muscle., Disp: , Rfl:    cyclobenzaprine (FLEXERIL) 10 MG tablet, Take 1 tablet by mouth three times daily as  needed for muscle spasm, Disp: 60 tablet, Rfl: 3   dimenhyDRINATE (DRAMAMINE) 50 MG tablet, Take 50 mg by mouth every 8 (eight) hours as needed for nausea or dizziness., Disp: , Rfl:    donepezil (ARICEPT) 5 MG tablet, TAKE 1 TABLET BY MOUTH AT BEDTIME, Disp: 90 tablet, Rfl: 0   ezetimibe (ZETIA) 10 MG tablet, Take 1 tablet (10 mg total) by mouth daily., Disp: 90 tablet, Rfl: 1   FLUoxetine (PROZAC) 40 MG capsule, Take 80 mg by mouth M, W, F and 40 mg by mouth Tues, Thurs, Sat and Sunday, Disp: 102 capsule, Rfl: 1   fluticasone (FLONASE) 50 MCG/ACT nasal spray, Place 2 sprays into both nostrils daily as needed for rhinitis., Disp: , Rfl:    fluticasone-salmeterol (ADVAIR) 250-50 MCG/ACT AEPB, Inhale 1 puff into the lungs in the morning and at bedtime., Disp: 60 each, Rfl: 3   gabapentin (NEURONTIN) 300 MG capsule, Take 1 capsule (300 mg total) by mouth 2 (two) times daily., Disp: , Rfl:    guaiFENesin (MUCINEX) 600 MG 12 hr tablet, Take 600 mg by mouth 2 (two) times daily as needed for to loosen phlegm., Disp: , Rfl:    lisinopril (ZESTRIL) 5 MG tablet, Take 1 tablet (5 mg total) by mouth daily., Disp: 90 tablet, Rfl: 3   metoprolol tartrate (LOPRESSOR) 25 MG tablet, TAKE 1/2 TABLET TWICE DAILY, Disp: 90 tablet, Rfl:  1   Multiple Vitamin (MULTIVITAMIN) tablet, Take 1 tablet by mouth daily., Disp: , Rfl:    naproxen sodium (ALEVE) 220 MG tablet, Take 220 mg by mouth in the morning., Disp: , Rfl:    ondansetron (ZOFRAN-ODT) 4 MG disintegrating tablet, Take 1 tablet (4 mg total) by mouth every 8 (eight) hours as needed for nausea or vomiting., Disp: 15 tablet, Rfl: 0   pantoprazole (PROTONIX) 40 MG tablet, TAKE 1 TABLET 30 MINUTES PRIOR TO BREAKFAST, Disp: 90 tablet, Rfl: 3   rosuvastatin (CRESTOR) 40 MG tablet, Take 1 tablet by mouth once daily, Disp: 90 tablet, Rfl: 0   Simethicone 125 MG CAPS, Take 125 mg by mouth daily as needed (gas)., Disp: , Rfl:    vitamin E 180 MG (400 UNITS) capsule, Take 400  Units by mouth daily., Disp: , Rfl:   Past Medical History: Past Medical History:  Diagnosis Date   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 sting    Aortic stenosis    a. 2013: nl LV sys fxn, mild MR, no evidence of pulm htn; b. TTE 8/17: EF 60-65%, no RWMA, nl LV dia fxn,  mild AS, mod AI   CAD (coronary artery disease), native coronary artery 11/26/2020   Controlled type 2 diabetes mellitus with diabetic autonomic neuropathy, without long-term current use of insulin (HCC) 08/10/2007   COPD (chronic obstructive pulmonary disease) (HCC) 10/30/2016   Depression    GAD (generalized anxiety disorder)    GERD (gastroesophageal reflux disease)    Hiatal hernia with gastroesophageal reflux 1997   Hyperlipidemia    Hypertension    Osteoporosis    S/P TAVR (transcatheter aortic valve replacement) 12/21/2023   s/p TAVR with a 34 mm Evolut FX via the TF approach by Dr. Lynnette Caffey & Dr. Leafy Ro   Tobacco abuse     Tobacco Use: Social History   Tobacco Use  Smoking Status Former   Average packs/day: 0.8 packs/day for 45.0 years (33.8 ttl pk-yrs)   Types: Cigarettes   Start date: 01/2023  Smokeless Tobacco Never  Tobacco Comments   Does not on starting back smoking.    Labs: Review Flowsheet  More data exists      Latest Ref Rng & Units 11/26/2021 08/12/2022 01/28/2023 10/04/2023 12/21/2023  Labs for ITP Cardiac and Pulmonary Rehab  Cholestrol 0 - 200 mg/dL - - 161  - -  LDL (calc) 0 - 99 mg/dL - - 57  - -  HDL-C >09.60 mg/dL - - 45.40  - -  Trlycerides 0.0 - 149.0 mg/dL - - 98.1  - -  Hemoglobin A1c 4.6 - 6.5 % 6.4  6.1  6.4  - -  PH, Arterial 7.35 - 7.45 - - - 7.353  -  PCO2 arterial 32 - 48 mmHg - - - 44.6  -  Bicarbonate 20.0 - 28.0 mmol/L - - - 25.2  24.8  -  TCO2 22 - 32 mmol/L - - - 27  26  26    Acid-base deficit 0.0 - 2.0 mmol/L - - - 1.0  1.0  -  O2 Saturation % - - - 69  95  -    Details       Multiple values from one day are sorted in reverse-chronological order          Exercise  Target Goals: Exercise Program Goal: Individual exercise prescription set using results from initial 6 min walk test and THRR while considering  patient's activity barriers and safety.   Exercise Prescription Goal: Initial  exercise prescription builds to 30-45 minutes a day of aerobic activity, 2-3 days per week.  Home exercise guidelines will be given to patient during program as part of exercise prescription that the participant will acknowledge.   Education: Aerobic Exercise: - Group verbal and visual presentation on the components of exercise prescription. Introduces F.I.T.T principle from ACSM for exercise prescriptions.  Reviews F.I.T.T. principles of aerobic exercise including progression. Written material given at graduation.   Education: Resistance Exercise: - Group verbal and visual presentation on the components of exercise prescription. Introduces F.I.T.T principle from ACSM for exercise prescriptions  Reviews F.I.T.T. principles of resistance exercise including progression. Written material given at graduation.    Education: Exercise & Equipment Safety: - Individual verbal instruction and demonstration of equipment use and safety with use of the equipment. Flowsheet Row Cardiac Rehab from 03/08/2024 in [facility] Cardiac and Pulmonary Rehab  Date 02/07/24  Educator [facility]  Instruction Review Code 1- Verbalizes Understanding       Education: Exercise Physiology & General Exercise Guidelines: - Group verbal and written instruction with models to review the exercise physiology of the cardiovascular system and associated critical values. Provides general exercise guidelines with specific guidelines to those with heart or lung disease.    Education: Flexibility, Balance, Mind/Body Relaxation: - Group verbal and visual presentation with interactive activity on the components of exercise prescription. Introduces F.I.T.T principle from ACSM for exercise prescriptions. Reviews F.I.T.T.  principles of flexibility and balance exercise training including progression. Also discusses the mind body connection.  Reviews various relaxation techniques to help reduce and manage stress (i.e. Deep breathing, progressive muscle relaxation, and visualization). Balance handout provided to take home. Written material given at graduation.   Activity Barriers & Risk Stratification:  Activity Barriers & Cardiac Risk Stratification - 02/21/24 1558       Activity Barriers & Cardiac Risk Stratification   Activity Barriers History of Falls;Balance Concerns;Left Knee Replacement;Right Knee Replacement;Back Problems;Neck/Spine Problems;Muscular Weakness;Deconditioning;Assistive Device    Cardiac Risk Stratification Low             6 Minute Walk:  6 Minute Walk     Row Name 02/21/24 1557         6 Minute Walk   Phase Initial     Distance 580 feet     Walk Time 6 minutes     # of Rest Breaks 0     MPH 1.1     METS 1     RPE 11     Perceived Dyspnea  1     VO2 Peak 3.21     Symptoms Yes (comment)     Comments leg fatigue, used walker     Resting HR 65 bpm     Resting BP 102/56     Resting Oxygen Saturation  96 %     Exercise Oxygen Saturation  during 6 min walk 94 %     Max Ex. HR 96 bpm     Max Ex. BP 138/70     2 Minute Post BP 134/68              Oxygen Initial Assessment:   Oxygen Re-Evaluation:   Oxygen Discharge (Final Oxygen Re-Evaluation):   Initial Exercise Prescription:  Initial Exercise Prescription - 02/21/24 1600       Date of Initial Exercise RX and Referring Provider   Date 02/21/24    Referring Provider Dr. Lorine Bears, MD      Oxygen   Maintain Oxygen Saturation 88%  or higher      Recumbant Bike   Level 1    RPM 50    Watts 15    Minutes 15    METs 1      NuStep   Level 1    SPM 80    Minutes 15    METs 1      Track   Laps 15    Minutes 15    METs 1.82      Prescription Details   Frequency (times per week) 3     Duration Progress to 30 minutes of continuous aerobic without signs/symptoms of physical distress      Intensity   THRR 40-80% of Max Heartrate 97-130    Ratings of Perceived Exertion 11-13    Perceived Dyspnea 0-4      Progression   Progression Continue to progress workloads to maintain intensity without signs/symptoms of physical distress.      Resistance Training   Training Prescription Yes    Weight 3 lb    Reps 10-15             Perform Capillary Blood Glucose checks as needed.  Exercise Prescription Changes:   Exercise Prescription Changes     Row Name 02/21/24 1600 03/09/24 0800           Response to Exercise   Blood Pressure (Admit) 102/56 128/60      Blood Pressure (Exercise) 138/70 144/70      Blood Pressure (Exit) 134/68 118/62      Heart Rate (Admit) 65 bpm 82 bpm      Heart Rate (Exercise) 96 bpm 106 bpm      Heart Rate (Exit) 78 bpm 79 bpm      Oxygen Saturation (Admit) 96 % --      Oxygen Saturation (Exercise) 94 % --      Rating of Perceived Exertion (Exercise) 11 14      Perceived Dyspnea (Exercise) 1 --      Symptoms leg fatgue, used walker none      Comments Results First 2 weeks of exercise sessions      Duration -- Continue with 30 min of aerobic exercise without signs/symptoms of physical distress.      Intensity -- THRR unchanged        Progression   Progression -- Continue to progress workloads to maintain intensity without signs/symptoms of physical distress.      Average METs -- 2.24        Resistance Training   Training Prescription -- Yes      Weight -- 3 lb      Reps -- 10-15        Interval Training   Interval Training -- No        Recumbant Bike   Level -- 2      Watts -- 18      Minutes -- 15      METs -- 2.45        NuStep   Level -- 2      Minutes -- 15      METs -- 2.4        Oxygen   Maintain Oxygen Saturation -- 88% or higher               Exercise Comments:   Exercise Goals and Review:    Exercise Goals     Row Name 02/21/24 1558  Exercise Goals   Increase Physical Activity Yes       Intervention Provide advice, education, support and counseling about physical activity/exercise needs.;Develop an individualized exercise prescription for aerobic and resistive training based on initial evaluation findings, risk stratification, comorbidities and participant's personal goals.       Expected Outcomes Short Term: Attend rehab on a regular basis to increase amount of physical activity.;Long Term: Exercising regularly at least 3-5 days a week.;Long Term: Add in home exercise to make exercise part of routine and to increase amount of physical activity.       Increase Strength and Stamina Yes       Intervention Provide advice, education, support and counseling about physical activity/exercise needs.;Develop an individualized exercise prescription for aerobic and resistive training based on initial evaluation findings, risk stratification, comorbidities and participant's personal goals.       Expected Outcomes Short Term: Increase workloads from initial exercise prescription for resistance, speed, and METs.;Short Term: Perform resistance training exercises routinely during rehab and add in resistance training at home;Long Term: Improve cardiorespiratory fitness, muscular endurance and strength as measured by increased METs and functional capacity ( )       Able to understand and use rate of perceived exertion (RPE) scale Yes       Intervention Provide education and explanation on how to use RPE scale       Expected Outcomes Short Term: Able to use RPE daily in rehab to express subjective intensity level;Long Term:  Able to use RPE to guide intensity level when exercising independently       Able to understand and use Dyspnea scale Yes       Intervention Provide education and explanation on how to use Dyspnea scale       Expected Outcomes Short Term: Able to use Dyspnea scale  daily in rehab to express subjective sense of shortness of breath during exertion;Long Term: Able to use Dyspnea scale to guide intensity level when exercising independently       Knowledge and understanding of Target Heart Rate Range (THRR) Yes       Intervention Provide education and explanation of THRR including how the numbers were predicted and where they are located for reference       Expected Outcomes Short Term: Able to state/look up THRR;Long Term: Able to use THRR to govern intensity when exercising independently;Short Term: Able to use daily as guideline for intensity in rehab       Able to check pulse independently Yes       Intervention Provide education and demonstration on how to check pulse in carotid and radial arteries.;Review the importance of being able to check your own pulse for safety during independent exercise       Expected Outcomes Short Term: Able to explain why pulse checking is important during independent exercise;Long Term: Able to check pulse independently and accurately       Understanding of Exercise Prescription Yes       Intervention Provide education, explanation, and written materials on patient's individual exercise prescription       Expected Outcomes Short Term: Able to explain program exercise prescription;Long Term: Able to explain home exercise prescription to exercise independently                Exercise Goals Re-Evaluation :  Exercise Goals Re-Evaluation     Row Name 03/09/24 0804             Exercise Goal Re-Evaluation   Exercise  Goals Review Increase Physical Activity;Understanding of Exercise Prescription;Increase Strength and Stamina       Comments Braylyn is off to a good start in the program and completed her first 2 weeks of exercise sessions in this review. She tolerated her exercise prescription well and increased workloads. She increased to level 2 on the recumbent bike and to level 2 on the T4 nustep. We will continue to monitor  her progress in the program.       Expected Outcomes Short: Continue to follow current exercise prescription. Long: Continue exercise to improve strength and stamina.                Discharge Exercise Prescription (Final Exercise Prescription Changes):  Exercise Prescription Changes - 03/09/24 0800       Response to Exercise   Blood Pressure (Admit) 128/60    Blood Pressure (Exercise) 144/70    Blood Pressure (Exit) 118/62    Heart Rate (Admit) 82 bpm    Heart Rate (Exercise) 106 bpm    Heart Rate (Exit) 79 bpm    Rating of Perceived Exertion (Exercise) 14    Symptoms none    Comments First 2 weeks of exercise sessions    Duration Continue with 30 min of aerobic exercise without signs/symptoms of physical distress.    Intensity THRR unchanged      Progression   Progression Continue to progress workloads to maintain intensity without signs/symptoms of physical distress.    Average METs 2.24      Resistance Training   Training Prescription Yes    Weight 3 lb    Reps 10-15      Interval Training   Interval Training No      Recumbant Bike   Level 2    Watts 18    Minutes 15    METs 2.45      NuStep   Level 2    Minutes 15    METs 2.4      Oxygen   Maintain Oxygen Saturation 88% or higher             Nutrition:  Target Goals: Understanding of nutrition guidelines, daily intake of sodium 1500mg , cholesterol 200mg , calories 30% from fat and 7% or less from saturated fats, daily to have 5 or more servings of fruits and vegetables.  Education: All About Nutrition: -Group instruction provided by verbal, written material, interactive activities, discussions, models, and posters to present general guidelines for heart healthy nutrition including fat, fiber, MyPlate, the role of sodium in heart healthy nutrition, utilization of the nutrition label, and utilization of this knowledge for meal planning. Follow up email sent as well. Written material given at  graduation. Flowsheet Row Cardiac Rehab from 03/08/2024 in [facility] Cardiac and Pulmonary Rehab  Date 03/08/24  Alberteen Sam 2]  Educator Ottis Stain  Instruction Review Code 1- Verbalizes Understanding       Biometrics:  Pre Biometrics - 02/21/24 1559       Pre Biometrics   Height 5' 6.5" (1.689 m)    Weight 235 lb 1.6 oz (106.6 kg)    Waist Circumference 47.5 inches    Hip Circumference 53 inches    Waist to Hip Ratio 0.9 %    BMI (Calculated) 37.38    Single Leg Stand 0 seconds   unable to complete             Nutrition Therapy Plan and Nutrition Goals:  Nutrition Therapy & Goals - 02/23/24 1612  Nutrition Therapy   Diet Carb controlled, Cardiac, Low Na    Protein (specify units) 60-90    Fiber 25 grams    Whole Grain Foods 3 servings    Saturated Fats 15 max. grams    Fruits and Vegetables 5 servings/day    Sodium 2 grams      Personal Nutrition Goals   Nutrition Goal Make plates with more veggies, make daughter healthy plates    Personal Goal #2 Read labels and reduce sodium intake to below 2300mg . Ideally 1500mg  per day.    Personal Goal #3 Eat 15-30gProtein and 30-60gCarbs at each meal.    Comments Patient drinking mostly water, she has DM and doesn't drink any sugary beverages. She is looking to lose weight and eat heart healthy. She used to make meals for her and her husband that were made of a protein, a carb, and a veggie. When her husband passed in late November she received lots of food, then with the holiday she got more. Her freezer was full and she has been eating out of it for the past few months. But now that she is close to finishing off that food, she is having to adjust to her new routine living with her daughter and new kitchen appliances. Provided counseling, support and encouragement. Reviewed a few facts labels and brainstormed ways to get back into healthier eating centered around more veggies and less high calorie foods. She likes veggies and says she could  work on eating more. Discussed building plates for her and her daughter with more low calorie foods and still having rooms for some of their favorites like pizza and venison burgers.      Intervention Plan   Intervention Prescribe, educate and counsel regarding individualized specific dietary modifications aiming towards targeted core components such as weight, hypertension, lipid management, diabetes, heart failure and other comorbidities.;Nutrition handout(s) given to patient.    Expected Outcomes Short Term Goal: Understand basic principles of dietary content, such as calories, fat, sodium, cholesterol and nutrients.;Short Term Goal: A plan has been developed with personal nutrition goals set during dietitian appointment.;Long Term Goal: Adherence to prescribed nutrition plan.             Nutrition Assessments:  MEDIFICTS Score Key: >=70 Need to make dietary changes  40-70 Heart Healthy Diet <= 40 Therapeutic Level Cholesterol Diet   Picture Your Plate Scores: <40 Unhealthy dietary pattern with much room for improvement. 41-50 Dietary pattern unlikely to meet recommendations for good health and room for improvement. 51-60 More healthful dietary pattern, with some room for improvement.  >60 Healthy dietary pattern, although there may be some specific behaviors that could be improved.    Nutrition Goals Re-Evaluation:   Nutrition Goals Discharge (Final Nutrition Goals Re-Evaluation):   Psychosocial: Target Goals: Acknowledge presence or absence of significant depression and/or stress, maximize coping skills, provide positive support system. Participant is able to verbalize types and ability to use techniques and skills needed for reducing stress and depression.   Education: Stress, Anxiety, and Depression - Group verbal and visual presentation to define topics covered.  Reviews how body is impacted by stress, anxiety, and depression.  Also discusses healthy ways to reduce stress  and to treat/manage anxiety and depression.  Written material given at graduation.   Education: Sleep Hygiene -Provides group verbal and written instruction about how sleep can affect your health.  Define sleep hygiene, discuss sleep cycles and impact of sleep habits. Review good sleep hygiene tips.  Initial Review & Psychosocial Screening:  Initial Psych Review & Screening - 02/07/24 1207       Initial Review   Current issues with Current Depression;History of Depression;Current Anxiety/Panic;Current Psychotropic Meds      Family Dynamics   Good Support System? Yes    Comments Zyairah has been on medication for years for her depression and anxiety. Her husband died in 11/01/24 and she had her health delice with her valve replacement. She can look to her daughter and son for support.      Barriers   Psychosocial barriers to participate in program The patient should benefit from training in stress management and relaxation.;There are no identifiable barriers or psychosocial needs.      Screening Interventions   Interventions Provide feedback about the scores to participant;To provide support and resources with identified psychosocial needs;Encouraged to exercise    Expected Outcomes Short Term goal: Utilizing psychosocial counselor, staff and physician to assist with identification of specific Stressors or current issues interfering with healing process. Setting desired goal for each stressor or current issue identified.;Long Term Goal: Stressors or current issues are controlled or eliminated.;Short Term goal: Identification and review with participant of any Quality of Life or Depression concerns found by scoring the questionnaire.;Long Term goal: The participant improves quality of Life and PHQ9 Scores as seen by post scores and/or verbalization of changes             Quality of Life Scores:   Scores of 19 and below usually indicate a poorer quality of life in these areas.  A  difference of  2-3 points is a clinically meaningful difference.  A difference of 2-3 points in the total score of the Quality of Life Index has been associated with significant improvement in overall quality of life, self-image, physical symptoms, and general health in studies assessing change in quality of life.  PHQ-9: Review Flowsheet  More data exists      02/21/2024 01/28/2023 12/18/2022 12/17/2021 12/16/2021  Depression screen PHQ 2/9  Decreased Interest 3 3 2  0 1 0  Down, Depressed, Hopeless 3 3 3  0 2 0  PHQ - 2 Score 6 6 5  0 3 0  Altered sleeping 3 3 3  - 2 -  Tired, decreased energy 3 3 3  - 3 -  Change in appetite 3 3 3  - 2 -  Feeling bad or failure about yourself  2 3 1  - 1 -  Trouble concentrating 3 3 3  - 3 -  Moving slowly or fidgety/restless 3 0 2 - 3 -  Suicidal thoughts 0 0 0 - 0 -  PHQ-9 Score 23 21 20  - 17 -  Difficult doing work/chores Extremely dIfficult Very difficult Very difficult - Somewhat difficult -    Details       Multiple values from one day are sorted in reverse-chronological order        Interpretation of Total Score  Total Score Depression Severity:  1-4 = Minimal depression, 5-9 = Mild depression, 10-14 = Moderate depression, 15-19 = Moderately severe depression, 20-27 = Severe depression   Psychosocial Evaluation and Intervention:  Psychosocial Evaluation - 02/07/24 1209       Psychosocial Evaluation & Interventions   Interventions Encouraged to exercise with the program and follow exercise prescription;Relaxation education;Stress management education    Comments Otie has been on medication for years for her depression and anxiety. Her husband died in 11/01/2024 and she had her health delice with her valve replacement. She can look  to her daughter and son for support.    Expected Outcomes Short: Attend [facility] stress management education to decrease stress. Long: Maintain exercise Post [facility] to keep stress at a minimum.    Continue  Psychosocial Services  Follow up required by staff             Psychosocial Re-Evaluation:   Psychosocial Discharge (Final Psychosocial Re-Evaluation):   Vocational Rehabilitation: Provide vocational rehab assistance to qualifying candidates.   Vocational Rehab Evaluation & Intervention:   Education: Education Goals: Education classes will be provided on a variety of topics geared toward better understanding of heart health and risk factor modification. Participant will state understanding/return demonstration of topics presented as noted by education test scores.  Learning Barriers/Preferences:  Learning Barriers/Preferences - 02/07/24 1206       Learning Barriers/Preferences   Learning Barriers Sight    Learning Preferences None             General Cardiac Education Topics:  AED/CPR: - Group verbal and written instruction with the use of models to demonstrate the basic use of the AED with the basic ABC's of resuscitation.   Anatomy and Cardiac Procedures: - Group verbal and visual presentation and models provide information about basic cardiac anatomy and function. Reviews the testing methods done to diagnose heart disease and the outcomes of the test results. Describes the treatment choices: Medical Management, Angioplasty, or Coronary Bypass Surgery for treating various heart conditions including Myocardial Infarction, Angina, Valve Disease, and Cardiac Arrhythmias.  Written material given at graduation.   Medication Safety: - Group verbal and visual instruction to review commonly prescribed medications for heart and lung disease. Reviews the medication, class of the drug, and side effects. Includes the steps to properly store meds and maintain the prescription regimen.  Written material given at graduation.   Intimacy: - Group verbal instruction through game format to discuss how heart and lung disease can affect sexual intimacy. Written material given at  graduation..   Know Your Numbers and Heart Failure: - Group verbal and visual instruction to discuss disease risk factors for cardiac and pulmonary disease and treatment options.  Reviews associated critical values for Overweight/Obesity, Hypertension, Cholesterol, and Diabetes.  Discusses basics of heart failure: signs/symptoms and treatments.  Introduces Heart Failure Zone chart for action plan for heart failure.  Written material given at graduation.   Infection Prevention: - Provides verbal and written material to individual with discussion of infection control including proper hand washing and proper equipment cleaning during exercise session. Flowsheet Row Cardiac Rehab from 03/08/2024 in [facility] Cardiac and Pulmonary Rehab  Date 02/07/24  Educator [facility]  Instruction Review Code 1- Verbalizes Understanding       Falls Prevention: - Provides verbal and written material to individual with discussion of falls prevention and safety. Flowsheet Row Cardiac Rehab from 03/08/2024 in [facility] Cardiac and Pulmonary Rehab  Date 02/07/24  Educator [facility]  Instruction Review Code 1- Verbalizes Understanding       Other: -Provides group and verbal instruction on various topics (see comments)   Knowledge Questionnaire Score:   Core Components/Risk Factors/Patient Goals at Admission:  Personal Goals and Risk Factors at Admission - 02/07/24 1206       Core Components/Risk Factors/Patient Goals on Admission    Weight Management Yes;Weight Loss    Intervention Weight Management: Develop a combined nutrition and exercise program designed to reach desired caloric intake, while maintaining appropriate intake of nutrient and fiber, sodium and fats, and appropriate energy expenditure required  for the weight goal.;Weight Management: Provide education and appropriate resources to help participant work on and attain dietary goals.;Weight Management/Obesity: Establish reasonable short term and long term weight  goals.    Expected Outcomes Short Term: Continue to assess and modify interventions until short term weight is achieved;Weight Maintenance: Understanding of the daily nutrition guidelines, which includes 25-35% calories from fat, 7% or less cal from saturated fats, less than 200mg  cholesterol, less than 1.5gm of sodium, & 5 or more servings of fruits and vegetables daily;Weight Loss: Understanding of general recommendations for a balanced deficit meal plan, which promotes 1-2 lb weight loss per week and includes a negative energy balance of [phone] kcal/d;Understanding recommendations for meals to include 15-35% energy as protein, 25-35% energy from fat, 35-60% energy from carbohydrates, less than 200mg  of dietary cholesterol, 20-35 gm of total fiber daily;Understanding of distribution of calorie intake throughout the day with the consumption of 4-5 meals/snacks    Tobacco Cessation Yes    Number of packs per day 0    Intervention Assist the participant in steps to quit. Provide individualized education and counseling about committing to Tobacco Cessation, relapse prevention, and pharmacological support that can be provided by physician.;Education officer, environmental, assist with locating and accessing local/national Quit Smoking programs, and support quit date choice.    Expected Outcomes Short Term: Will demonstrate readiness to quit, by selecting a quit date.;Long Term: Complete abstinence from all tobacco products for at least 12 months from quit date.;Short Term: Will quit all tobacco product use, adhering to prevention of relapse plan.    Diabetes Yes    Intervention Provide education about signs/symptoms and action to take for hypo/hyperglycemia.;Provide education about proper nutrition, including hydration, and aerobic/resistive exercise prescription along with prescribed medications to achieve blood glucose in normal ranges: Fasting glucose 65-99 mg/dL    Expected Outcomes Short Term: Participant  verbalizes understanding of the signs/symptoms and immediate care of hyper/hypoglycemia, proper foot care and importance of medication, aerobic/resistive exercise and nutrition plan for blood glucose control.;Long Term: Attainment of HbA1C < 7%.    Hypertension Yes    Intervention Provide education on lifestyle modifcations including regular physical activity/exercise, weight management, moderate sodium restriction and increased consumption of fresh fruit, vegetables, and low fat dairy, alcohol moderation, and smoking cessation.;Monitor prescription use compliance.    Expected Outcomes Long Term: Maintenance of blood pressure at goal levels.;Short Term: Continued assessment and intervention until BP is < 140/20mm HG in hypertensive participants. < 130/68mm HG in hypertensive participants with diabetes, heart failure or chronic kidney disease.    Lipids Yes    Intervention Provide education and support for participant on nutrition & aerobic/resistive exercise along with prescribed medications to achieve LDL 70mg , HDL >40mg .    Expected Outcomes Short Term: Participant states understanding of desired cholesterol values and is compliant with medications prescribed. Participant is following exercise prescription and nutrition guidelines.;Long Term: Cholesterol controlled with medications as prescribed, with individualized exercise RX and with personalized nutrition plan. Value goals: LDL < 70mg , HDL > 40 mg.             Education:Diabetes - Individual verbal and written instruction to review signs/symptoms of diabetes, desired ranges of glucose level fasting, after meals and with exercise. Acknowledge that pre and post exercise glucose checks will be done for 3 sessions at entry of program. Flowsheet Row Cardiac Rehab from 03/08/2024 in [facility] Cardiac and Pulmonary Rehab  Date 02/07/24  Educator [facility]  Instruction Review Code 1- Verbalizes Understanding  Core Components/Risk Factors/Patient Goals  Review:    Core Components/Risk Factors/Patient Goals at Discharge (Final Review):    ITP Comments:  ITP Comments     Row Name 02/07/24 1205 02/21/24 1553 03/15/24 0945       ITP Comments Virtual Visit completed. Patient informed on EP and RD appointment and 6 Minute walk test. Patient also informed of patient health questionnaires on My Chart. Patient Verbalizes understanding. Visit diagnosis can be found in [facility] 12/21/2023. Completed and gym orientation. Initial ITP created and sent for review to Dr. Bethann Punches, Medical Director. 30 Day review completed. Medical Director ITP review done, changes made as directed, and signed approval by Medical Director.   new to program              Comments:

## 2024-03-16 ENCOUNTER — Encounter: Admitting: *Deleted

## 2024-03-16 DIAGNOSIS — Z952 Presence of prosthetic heart valve: Secondary | ICD-10-CM | POA: Diagnosis not present

## 2024-03-16 NOTE — Progress Notes (Signed)
 Daily Session Note  Patient Details  Name: Amanda [name] MRN: [id number] Date of Birth: 11-26-1951 Referring Provider:   Flowsheet Row Cardiac Rehab from 02/21/2024 in [facility] Cardiac and Pulmonary Rehab  Referring Provider Dr. Lorine Bears, MD       Encounter Date: 03/16/2024  Check In:  Session Check In - 03/16/24 1350       Check-In   Supervising physician immediately available to respond to emergencies See telemetry face sheet for immediately available ER MD    Location [facility]-Cardiac & Pulmonary Rehab    Staff Present Susann Givens RN,BSN;[facility] Madilyn Fireman BS, ACSM CEP, Exercise Physiologist    Virtual Visit No    Medication changes reported     No    Fall or balance concerns reported    No    Warm-up and Cool-down Performed on first and last piece of equipment    Resistance Training Performed Yes    VAD Patient? No      Pain Assessment   Currently in Pain? No/denies                Social History   Tobacco Use  Smoking Status Former   Average packs/day: 0.8 packs/day for 45.0 years (33.8 ttl pk-yrs)   Types: Cigarettes   Start date: 01/2023  Smokeless Tobacco Never  Tobacco Comments   Does not on starting back smoking.    Goals Met:  Independence with exercise equipment Exercise tolerated well No report of concerns or symptoms today Strength training completed today  Goals Unmet:  Not Applicable  Comments: Pt able to follow exercise prescription today without complaint.  Will continue to monitor for progression.    Dr. Bethann Punches is Medical Director for [facility] Cardiac Rehabilitation.  Dr. Vida Rigger is Medical Director for [facility] Pulmonary Rehabilitation.

## 2024-03-20 ENCOUNTER — Encounter: Admitting: *Deleted

## 2024-03-20 DIAGNOSIS — Z952 Presence of prosthetic heart valve: Secondary | ICD-10-CM | POA: Diagnosis not present

## 2024-03-20 NOTE — Progress Notes (Deleted)
 Daily Session Note  Patient Details  Name: ALIESE BRANNUM MRN: [id number] Date of Birth: 10-Jan-1951 Referring Provider:   Flowsheet Row Cardiac Rehab from 02/21/2024 in [facility] Cardiac and Pulmonary Rehab  Referring Provider Dr. Antionette Kirks, MD       Encounter Date: 03/20/2024  Check In:      Social History   Tobacco Use  Smoking Status Former   Average packs/day: 0.8 packs/day for 45.0 years (33.8 ttl pk-yrs)   Types: Cigarettes   Start date: 01/2023  Smokeless Tobacco Never  Tobacco Comments   Does not on starting back smoking.    Goals Met:  Independence with exercise equipment Exercise tolerated well No report of concerns or symptoms today  Goals Unmet:  Not Applicable  Comments: Pt able to follow exercise prescription today without complaint.  Will continue to monitor for progression.    Dr. Firman Hughes is Medical Director for [facility] Cardiac Rehabilitation.  Dr. Fuad Aleskerov is Medical Director for [facility] Pulmonary Rehabilitation.

## 2024-03-20 NOTE — Progress Notes (Signed)
 Daily Session Note  Patient Details  Name: Amanda [name] MRN: [id number] Date of Birth: 01-10-1951 Referring Provider:   Flowsheet Row Cardiac Rehab from 02/21/2024 in [facility] Cardiac and Pulmonary Rehab  Referring Provider Dr. Antionette Kirks, MD       Encounter Date: 03/20/2024  Check In:  Session Check In - 03/20/24 1817       Check-In   Supervising physician immediately available to respond to emergencies See telemetry face sheet for immediately available ER MD    Location [facility]-Cardiac & Pulmonary Rehab    Staff Present Theone Fitting, RN, BSN, Debroah Fanning BS, ACSM CEP, Exercise Physiologist;Susanne Bice, RN, BSN, CCRP    Virtual Visit No    Medication changes reported     No    Fall or balance concerns reported    No    Tobacco Cessation No Change    Warm-up and Cool-down Performed on first and last piece of equipment    Resistance Training Performed Yes    VAD Patient? No      Pain Assessment   Currently in Pain? No/denies                Social History   Tobacco Use  Smoking Status Former   Average packs/day: 0.8 packs/day for 45.0 years (33.8 ttl pk-yrs)   Types: Cigarettes   Start date: 01/2023  Smokeless Tobacco Never  Tobacco Comments   Does not on starting back smoking.    Goals Met:  Independence with exercise equipment Exercise tolerated well Strength training completed today  Goals Unmet:  Not Applicable  Comments: Pt able to follow exercise prescription today without complaint.  Will continue to monitor for progression.    Dr. Firman Hughes is Medical Director for [facility] Cardiac Rehabilitation.  Dr. Fuad Aleskerov is Medical Director for [facility] Pulmonary Rehabilitation.

## 2024-03-22 ENCOUNTER — Encounter

## 2024-03-23 ENCOUNTER — Encounter: Admitting: *Deleted

## 2024-03-23 ENCOUNTER — Other Ambulatory Visit: Payer: Self-pay | Admitting: Physician Assistant

## 2024-03-23 DIAGNOSIS — Z952 Presence of prosthetic heart valve: Secondary | ICD-10-CM

## 2024-03-23 DIAGNOSIS — E785 Hyperlipidemia, unspecified: Secondary | ICD-10-CM

## 2024-03-23 NOTE — Progress Notes (Signed)
 Daily Session Note  Patient Details  Name: Amanda [name] MRN: [id number] Date of Birth: [birth year] Referring Provider:   Flowsheet Row Cardiac Rehab from 02/21/2024 in [facility] Cardiac and Pulmonary Rehab  Referring Provider Dr. Antionette Kirks, MD       Encounter Date: 03/23/2024  Check In:  Session Check In - 03/23/24 1356       Check-In   Supervising physician immediately available to respond to emergencies See telemetry face sheet for immediately available ER MD    Location [facility]-Cardiac & Pulmonary Rehab    Staff Present Sue Em RN,BSN;Laureen Bevin Bucks, BS, RRT, CPFT;Maxon Conetta BS, Exercise Physiologist;Noah Tickle, BS, Exercise Physiologist    Virtual Visit No    Medication changes reported     No    Fall or balance concerns reported    No    Tobacco Cessation No Change    Warm-up and Cool-down Performed on first and last piece of equipment    Resistance Training Performed Yes    VAD Patient? No    PAD/SET Patient? No      Pain Assessment   Currently in Pain? No/denies                Social History   Tobacco Use  Smoking Status Former   Average packs/day: 0.8 packs/day for 45.0 years (33.8 ttl pk-yrs)   Types: Cigarettes   Start date: 01/2023  Smokeless Tobacco Never  Tobacco Comments   Does not on starting back smoking.    Goals Met:  Independence with exercise equipment Exercise tolerated well No report of concerns or symptoms today Strength training completed today  Goals Unmet:  Not Applicable  Comments: Pt able to follow exercise prescription today without complaint.  Will continue to monitor for progression.    Dr. Firman Hughes is Medical Director for [facility] Cardiac Rehabilitation.  Dr. Fuad Aleskerov is Medical Director for [facility] Pulmonary Rehabilitation.

## 2024-03-27 ENCOUNTER — Encounter

## 2024-03-29 ENCOUNTER — Other Ambulatory Visit: Payer: Self-pay

## 2024-03-29 ENCOUNTER — Encounter: Payer: Self-pay | Admitting: Cardiovascular Disease

## 2024-03-29 ENCOUNTER — Inpatient Hospital Stay
Admission: EM | Admit: 2024-03-29 | Discharge: 2024-03-31 | DRG: 291 | Disposition: A | Discharge status: Home / Self Care | Attending: Student | Admitting: Student

## 2024-03-29 ENCOUNTER — Encounter: Payer: Self-pay | Admitting: Family Medicine

## 2024-03-29 ENCOUNTER — Encounter

## 2024-03-29 ENCOUNTER — Emergency Department

## 2024-03-29 DIAGNOSIS — Z803 Family history of malignant neoplasm of breast: Secondary | ICD-10-CM

## 2024-03-29 DIAGNOSIS — Z888 Allergy status to other drugs, medicaments and biological substances status: Secondary | ICD-10-CM

## 2024-03-29 DIAGNOSIS — I5033 Acute on chronic diastolic (congestive) heart failure: Secondary | ICD-10-CM

## 2024-03-29 DIAGNOSIS — Z981 Arthrodesis status: Secondary | ICD-10-CM | POA: Diagnosis not present

## 2024-03-29 DIAGNOSIS — Z952 Presence of prosthetic heart valve: Secondary | ICD-10-CM

## 2024-03-29 DIAGNOSIS — J9621 Acute and chronic respiratory failure with hypoxia: Secondary | ICD-10-CM | POA: Diagnosis present

## 2024-03-29 DIAGNOSIS — J441 Chronic obstructive pulmonary disease with (acute) exacerbation: Secondary | ICD-10-CM | POA: Diagnosis not present

## 2024-03-29 DIAGNOSIS — R0603 Acute respiratory distress: Principal | ICD-10-CM

## 2024-03-29 DIAGNOSIS — I214 Non-ST elevation (NSTEMI) myocardial infarction: Secondary | ICD-10-CM

## 2024-03-29 DIAGNOSIS — I130 Hypertensive heart and chronic kidney disease with heart failure and stage 1 through stage 4 chronic kidney disease, or unspecified chronic kidney disease: Principal | ICD-10-CM | POA: Diagnosis present

## 2024-03-29 DIAGNOSIS — Z833 Family history of diabetes mellitus: Secondary | ICD-10-CM

## 2024-03-29 DIAGNOSIS — I10 Essential (primary) hypertension: Secondary | ICD-10-CM | POA: Diagnosis present

## 2024-03-29 DIAGNOSIS — R000 Tachycardia, unspecified: Secondary | ICD-10-CM | POA: Diagnosis present

## 2024-03-29 DIAGNOSIS — Z8249 Family history of ischemic heart disease and other diseases of the circulatory system: Secondary | ICD-10-CM

## 2024-03-29 DIAGNOSIS — I4510 Unspecified right bundle-branch block: Secondary | ICD-10-CM | POA: Diagnosis present

## 2024-03-29 DIAGNOSIS — Z6836 Body mass index (BMI) 36.0-36.9, adult: Secondary | ICD-10-CM

## 2024-03-29 DIAGNOSIS — Z7951 Long term (current) use of inhaled steroids: Secondary | ICD-10-CM

## 2024-03-29 DIAGNOSIS — R7989 Other specified abnormal findings of blood chemistry: Secondary | ICD-10-CM | POA: Diagnosis not present

## 2024-03-29 DIAGNOSIS — Z8261 Family history of arthritis: Secondary | ICD-10-CM

## 2024-03-29 DIAGNOSIS — R0602 Shortness of breath: Secondary | ICD-10-CM | POA: Diagnosis not present

## 2024-03-29 DIAGNOSIS — E1122 Type 2 diabetes mellitus with diabetic chronic kidney disease: Secondary | ICD-10-CM | POA: Diagnosis present

## 2024-03-29 DIAGNOSIS — N182 Chronic kidney disease, stage 2 (mild): Secondary | ICD-10-CM | POA: Diagnosis present

## 2024-03-29 DIAGNOSIS — F411 Generalized anxiety disorder: Secondary | ICD-10-CM | POA: Diagnosis present

## 2024-03-29 DIAGNOSIS — Z96653 Presence of artificial knee joint, bilateral: Secondary | ICD-10-CM | POA: Diagnosis present

## 2024-03-29 DIAGNOSIS — Z9049 Acquired absence of other specified parts of digestive tract: Secondary | ICD-10-CM

## 2024-03-29 DIAGNOSIS — J9620 Acute and chronic respiratory failure, unspecified whether with hypoxia or hypercapnia: Secondary | ICD-10-CM

## 2024-03-29 DIAGNOSIS — E1143 Type 2 diabetes mellitus with diabetic autonomic (poly)neuropathy: Secondary | ICD-10-CM | POA: Diagnosis present

## 2024-03-29 DIAGNOSIS — I2489 Other forms of acute ischemic heart disease: Secondary | ICD-10-CM | POA: Diagnosis present

## 2024-03-29 DIAGNOSIS — E785 Hyperlipidemia, unspecified: Secondary | ICD-10-CM | POA: Diagnosis present

## 2024-03-29 DIAGNOSIS — J9622 Acute and chronic respiratory failure with hypercapnia: Secondary | ICD-10-CM | POA: Diagnosis present

## 2024-03-29 DIAGNOSIS — Z7982 Long term (current) use of aspirin: Secondary | ICD-10-CM

## 2024-03-29 DIAGNOSIS — Z953 Presence of xenogenic heart valve: Secondary | ICD-10-CM

## 2024-03-29 DIAGNOSIS — J9601 Acute respiratory failure with hypoxia: Secondary | ICD-10-CM

## 2024-03-29 DIAGNOSIS — R739 Hyperglycemia, unspecified: Secondary | ICD-10-CM

## 2024-03-29 DIAGNOSIS — M810 Age-related osteoporosis without current pathological fracture: Secondary | ICD-10-CM | POA: Diagnosis present

## 2024-03-29 DIAGNOSIS — Z5986 Financial insecurity: Secondary | ICD-10-CM

## 2024-03-29 DIAGNOSIS — Z83719 Family history of colon polyps, unspecified: Secondary | ICD-10-CM

## 2024-03-29 DIAGNOSIS — Z83438 Family history of other disorder of lipoprotein metabolism and other lipidemia: Secondary | ICD-10-CM

## 2024-03-29 DIAGNOSIS — Z7984 Long term (current) use of oral hypoglycemic drugs: Secondary | ICD-10-CM | POA: Diagnosis not present

## 2024-03-29 DIAGNOSIS — Z90710 Acquired absence of both cervix and uterus: Secondary | ICD-10-CM

## 2024-03-29 DIAGNOSIS — E538 Deficiency of other specified B group vitamins: Secondary | ICD-10-CM | POA: Diagnosis present

## 2024-03-29 DIAGNOSIS — Z5941 Food insecurity: Secondary | ICD-10-CM

## 2024-03-29 DIAGNOSIS — Z91030 Bee allergy status: Secondary | ICD-10-CM | POA: Diagnosis not present

## 2024-03-29 DIAGNOSIS — I2510 Atherosclerotic heart disease of native coronary artery without angina pectoris: Secondary | ICD-10-CM | POA: Diagnosis present

## 2024-03-29 DIAGNOSIS — Z87891 Personal history of nicotine dependence: Secondary | ICD-10-CM

## 2024-03-29 DIAGNOSIS — Z79899 Other long term (current) drug therapy: Secondary | ICD-10-CM | POA: Diagnosis not present

## 2024-03-29 DIAGNOSIS — I350 Nonrheumatic aortic (valve) stenosis: Secondary | ICD-10-CM

## 2024-03-29 DIAGNOSIS — R062 Wheezing: Secondary | ICD-10-CM | POA: Diagnosis not present

## 2024-03-29 LAB — BLOOD GAS, ARTERIAL
Acid-Base Excess: 0.8 mmol/L (ref 0.0–2.0)
Bicarbonate: 26.6 mmol/L (ref 20.0–28.0)
O2 Saturation: 99.1 %
Patient temperature: 37
pCO2 arterial: 46 mmHg (ref 32–48)
pH, Arterial: 7.37 (ref 7.35–7.45)
pO2, Arterial: 102 mmHg (ref 83–108)

## 2024-03-29 LAB — GLUCOSE, CAPILLARY
Glucose-Capillary: 227 mg/dL — ABNORMAL HIGH (ref 70–99)
Glucose-Capillary: 218 mg/dL — ABNORMAL HIGH (ref 70–99)
Glucose-Capillary: 226 mg/dL — ABNORMAL HIGH (ref 70–99)
Glucose-Capillary: 329 mg/dL — ABNORMAL HIGH (ref 70–99)

## 2024-03-29 LAB — COMPREHENSIVE METABOLIC PANEL WITH GFR
ALT: 14 U/L (ref 0–44)
AST: 24 U/L (ref 15–41)
Albumin: 4.1 g/dL (ref 3.5–5.0)
Alkaline Phosphatase: 37 U/L — ABNORMAL LOW (ref 38–126)
Anion gap: 9 (ref 5–15)
BUN: 10 mg/dL (ref 8–23)
CO2: 23 mmol/L (ref 22–32)
Calcium: 9.2 mg/dL (ref 8.9–10.3)
Chloride: 105 mmol/L (ref 98–111)
Creatinine, Ser: 0.8 mg/dL (ref 0.44–1.00)
GFR, Estimated: >60 mL/min (ref >60)
Glucose, Bld: 172 mg/dL — ABNORMAL HIGH (ref 70–99)
Potassium: 4 mmol/L (ref 3.5–5.1)
Sodium: 137 mmol/L (ref 135–145)
Total Bilirubin: 0.9 mg/dL (ref 0.0–1.2)
Total Protein: 6.9 g/dL (ref 6.5–8.1)

## 2024-03-29 LAB — RESP PANEL BY RT-PCR (RSV, FLU A&B, COVID)  RVPGX2
Influenza A by PCR: NEGATIVE
Influenza B by PCR: NEGATIVE
Resp Syncytial Virus by PCR: NEGATIVE
SARS Coronavirus 2 by RT PCR: NEGATIVE

## 2024-03-29 LAB — CBC WITH DIFFERENTIAL/PLATELET
Abs Immature Granulocytes: 0.03 10*3/uL (ref 0.00–0.07)
Basophils Absolute: 0.1 10*3/uL (ref 0.0–0.1)
Basophils Relative: 1 %
Eosinophils Absolute: 0.5 10*3/uL (ref 0.0–0.5)
Eosinophils Relative: 7 %
HCT: 38.8 % (ref 36.0–46.0)
Hemoglobin: 13.2 g/dL (ref 12.0–15.0)
Immature Granulocytes: 0 %
Lymphocytes Relative: 12 %
Lymphs Abs: 0.9 10*3/uL (ref 0.7–4.0)
MCH: 33.1 pg (ref 26.0–34.0)
MCHC: 34 g/dL (ref 30.0–36.0)
MCV: 97.2 fL (ref 80.0–100.0)
Monocytes Absolute: 0.6 10*3/uL (ref 0.1–1.0)
Monocytes Relative: 8 %
Neutro Abs: 5.1 10*3/uL (ref 1.7–7.7)
Neutrophils Relative %: 72 %
Platelets: 146 10*3/uL — ABNORMAL LOW (ref 150–400)
RBC: 3.99 MIL/uL (ref 3.87–5.11)
RDW: 11.5 % (ref 11.5–15.5)
WBC: 7.1 10*3/uL (ref 4.0–10.5)
nRBC: 0 % (ref 0.0–0.2)

## 2024-03-29 LAB — MAGNESIUM: Magnesium: 2.1 mg/dL (ref 1.7–2.4)

## 2024-03-29 LAB — HEMOGLOBIN A1C
Hgb A1c MFr Bld: 6.9 % — ABNORMAL HIGH (ref 4.8–5.6)
Mean Plasma Glucose: 151.33 mg/dL

## 2024-03-29 LAB — HIV ANTIBODY (ROUTINE TESTING W REFLEX): HIV Screen 4th Generation wRfx: NONREACTIVE

## 2024-03-29 LAB — TROPONIN I (HIGH SENSITIVITY)
Troponin I (High Sensitivity): 103 ng/L (ref <18)
Troponin I (High Sensitivity): 451 ng/L (ref <18)
Troponin I (High Sensitivity): 610 ng/L (ref <18)
Troponin I (High Sensitivity): 514 ng/L (ref <18)

## 2024-03-29 LAB — HEPARIN LEVEL (UNFRACTIONATED)
Heparin Unfractionated: 0.47 [IU]/mL (ref 0.30–0.70)
Heparin Unfractionated: 0.46 [IU]/mL (ref 0.30–0.70)

## 2024-03-29 LAB — BRAIN NATRIURETIC PEPTIDE: B Natriuretic Peptide: 39.2 pg/mL (ref 0.0–100.0)

## 2024-03-29 LAB — LACTIC ACID, PLASMA
Lactic Acid, Venous: 1.1 mmol/L (ref 0.5–1.9)
Lactic Acid, Venous: 1 mmol/L (ref 0.5–1.9)

## 2024-03-29 MED ORDER — FUROSEMIDE 10 MG/ML IJ SOLN
40.0000 mg | Freq: Two times a day (BID) | INTRAMUSCULAR | Status: DC
  Administered 2024-03-29 – 2024-03-31 (×5): 40 mg via INTRAVENOUS
  Filled 2024-03-29 (×5): qty 4

## 2024-03-29 MED ORDER — ONDANSETRON HCL 4 MG/2ML IJ SOLN
4.0000 mg | Freq: Four times a day (QID) | INTRAMUSCULAR | Status: DC | PRN
Start: 2024-03-29 — End: 2024-03-31

## 2024-03-29 MED ORDER — FLUOXETINE HCL 20 MG PO CAPS
40.0000 mg | ORAL_CAPSULE | Freq: Every day | ORAL | Status: DC
  Administered 2024-03-29 – 2024-03-31 (×3): 40 mg via ORAL
  Filled 2024-03-29 (×3): qty 2

## 2024-03-29 MED ORDER — ROSUVASTATIN CALCIUM 10 MG PO TABS
40.0000 mg | ORAL_TABLET | Freq: Every day | ORAL | Status: DC
  Administered 2024-03-29 – 2024-03-31 (×3): 40 mg via ORAL
  Filled 2024-03-29 (×3): qty 4
  Filled 2024-03-29: qty 2

## 2024-03-29 MED ORDER — HEPARIN (PORCINE) 25000 UT/250ML-% IV SOLN
1200.0000 [IU]/h | INTRAVENOUS | Status: DC
  Administered 2024-03-29 (×2): 1200 [IU]/h via INTRAVENOUS
  Filled 2024-03-29 (×2): qty 250

## 2024-03-29 MED ORDER — INSULIN ASPART 100 UNIT/ML IJ SOLN
0.0000 [IU] | Freq: Every day | INTRAMUSCULAR | Status: DC
  Administered 2024-03-29: 4 [IU] via SUBCUTANEOUS
  Administered 2024-03-30: 3 [IU] via SUBCUTANEOUS
  Filled 2024-03-29 (×2): qty 1

## 2024-03-29 MED ORDER — ACETAMINOPHEN 325 MG PO TABS
650.0000 mg | ORAL_TABLET | ORAL | Status: DC | PRN
  Administered 2024-03-29: 650 mg via ORAL
  Filled 2024-03-29: qty 2

## 2024-03-29 MED ORDER — ASPIRIN 81 MG PO TBEC
81.0000 mg | DELAYED_RELEASE_TABLET | Freq: Every day | ORAL | Status: DC
  Administered 2024-03-29 – 2024-03-31 (×3): 81 mg via ORAL
  Filled 2024-03-29 (×3): qty 1

## 2024-03-29 MED ORDER — METOPROLOL TARTRATE 25 MG PO TABS
25.0000 mg | ORAL_TABLET | Freq: Two times a day (BID) | ORAL | Status: DC
  Administered 2024-03-29 – 2024-03-31 (×5): 25 mg via ORAL
  Filled 2024-03-29 (×5): qty 1

## 2024-03-29 MED ORDER — ACETAMINOPHEN 325 MG PO TABS
650.0000 mg | ORAL_TABLET | Freq: Four times a day (QID) | ORAL | Status: DC | PRN
  Administered 2024-03-29: 650 mg via ORAL
  Filled 2024-03-29: qty 2

## 2024-03-29 MED ORDER — CLONAZEPAM 0.5 MG PO TABS
0.5000 mg | ORAL_TABLET | Freq: Two times a day (BID) | ORAL | Status: DC | PRN
  Administered 2024-03-29 – 2024-03-30 (×3): 0.5 mg via ORAL
  Filled 2024-03-29 (×3): qty 1

## 2024-03-29 MED ORDER — GABAPENTIN 300 MG PO CAPS
300.0000 mg | ORAL_CAPSULE | Freq: Two times a day (BID) | ORAL | Status: DC
  Administered 2024-03-29 – 2024-03-31 (×5): 300 mg via ORAL
  Filled 2024-03-29 (×5): qty 1

## 2024-03-29 MED ORDER — BUDESONIDE 0.5 MG/2ML IN SUSP
0.5000 mg | Freq: Two times a day (BID) | RESPIRATORY_TRACT | Status: DC
  Administered 2024-03-29 – 2024-03-31 (×5): 0.5 mg via RESPIRATORY_TRACT
  Filled 2024-03-29 (×5): qty 2

## 2024-03-29 MED ORDER — HEPARIN (PORCINE) 25000 UT/250ML-% IV SOLN: 14.0000 [IU]/kg/h | INTRAVENOUS | Status: DC

## 2024-03-29 MED ORDER — IPRATROPIUM-ALBUTEROL 0.5-2.5 (3) MG/3ML IN SOLN
3.0000 mL | Freq: Two times a day (BID) | RESPIRATORY_TRACT | Status: DC
  Administered 2024-03-30 – 2024-03-31 (×3): 3 mL via RESPIRATORY_TRACT
  Filled 2024-03-29 (×3): qty 3

## 2024-03-29 MED ORDER — INSULIN ASPART 100 UNIT/ML IJ SOLN
0.0000 [IU] | Freq: Three times a day (TID) | INTRAMUSCULAR | Status: DC
  Administered 2024-03-29 – 2024-03-30 (×4): 5 [IU] via SUBCUTANEOUS
  Administered 2024-03-30: 3 [IU] via SUBCUTANEOUS
  Administered 2024-03-30: 5 [IU] via SUBCUTANEOUS
  Administered 2024-03-31 (×2): 3 [IU] via SUBCUTANEOUS
  Filled 2024-03-29 (×8): qty 1

## 2024-03-29 MED ORDER — HEPARIN SODIUM (PORCINE) 5000 UNIT/ML IJ SOLN: 4000.0000 [IU] | Freq: Once | INTRAMUSCULAR | Status: DC

## 2024-03-29 MED ORDER — CYANOCOBALAMIN 1000 MCG/ML IJ SOLN
1000.0000 ug | Freq: Every day | INTRAMUSCULAR | Status: DC
Start: 2024-03-29 — End: 2024-03-31
  Administered 2024-03-29 – 2024-03-31 (×3): 1000 ug via INTRAMUSCULAR
  Filled 2024-03-29 (×3): qty 1

## 2024-03-29 MED ORDER — METHYLPREDNISOLONE SODIUM SUCC 40 MG IJ SOLR
40.0000 mg | Freq: Two times a day (BID) | INTRAMUSCULAR | Status: AC
  Administered 2024-03-29 – 2024-03-30 (×2): 40 mg via INTRAVENOUS
  Filled 2024-03-29 (×2): qty 1

## 2024-03-29 MED ORDER — VITAMIN B-12 1000 MCG PO TABS: 1000.0000 ug | ORAL_TABLET | Freq: Every day | ORAL | Status: DC

## 2024-03-29 MED ORDER — NITROGLYCERIN 0.4 MG SL SUBL: 0.4000 mg | SUBLINGUAL_TABLET | SUBLINGUAL | Status: DC | PRN

## 2024-03-29 MED ORDER — METHYLPREDNISOLONE SODIUM SUCC 125 MG IJ SOLR
125.0000 mg | Freq: Once | INTRAMUSCULAR | Status: AC
  Administered 2024-03-29: 125 mg via INTRAVENOUS
  Filled 2024-03-29: qty 2

## 2024-03-29 MED ORDER — PANTOPRAZOLE SODIUM 40 MG PO TBEC
40.0000 mg | DELAYED_RELEASE_TABLET | Freq: Every day | ORAL | Status: DC
  Administered 2024-03-29 – 2024-03-31 (×3): 40 mg via ORAL
  Filled 2024-03-29 (×3): qty 1

## 2024-03-29 MED ORDER — IPRATROPIUM-ALBUTEROL 0.5-2.5 (3) MG/3ML IN SOLN
3.0000 mL | Freq: Four times a day (QID) | RESPIRATORY_TRACT | Status: DC
  Administered 2024-03-29 (×4): 3 mL via RESPIRATORY_TRACT
  Filled 2024-03-29 (×4): qty 3

## 2024-03-29 MED ORDER — LISINOPRIL 5 MG PO TABS
5.0000 mg | ORAL_TABLET | Freq: Every day | ORAL | Status: DC
  Administered 2024-03-29: 5 mg via ORAL
  Filled 2024-03-29: qty 1

## 2024-03-29 MED ORDER — PREDNISONE 20 MG PO TABS
40.0000 mg | ORAL_TABLET | Freq: Every day | ORAL | Status: DC
Start: 2024-03-30 — End: 2024-03-31
  Administered 2024-03-30 – 2024-03-31 (×2): 40 mg via ORAL
  Filled 2024-03-29 (×2): qty 2

## 2024-03-29 MED ORDER — DONEPEZIL HCL 5 MG PO TABS
5.0000 mg | ORAL_TABLET | Freq: Every day | ORAL | Status: DC
  Administered 2024-03-29: 5 mg via ORAL
  Filled 2024-03-29 (×4): qty 1

## 2024-03-29 MED ORDER — HEPARIN BOLUS VIA INFUSION
4000.0000 [IU] | Freq: Once | INTRAVENOUS | Status: AC
  Administered 2024-03-29: 4000 [IU] via INTRAVENOUS
  Filled 2024-03-29: qty 4000

## 2024-03-29 MED ORDER — OXYCODONE HCL 5 MG PO TABS
5.0000 mg | ORAL_TABLET | Freq: Four times a day (QID) | ORAL | Status: DC | PRN
  Administered 2024-03-30 – 2024-03-31 (×3): 5 mg via ORAL
  Filled 2024-03-29 (×4): qty 1

## 2024-03-29 MED ORDER — MAGNESIUM SULFATE 2 GM/50ML IV SOLN
2.0000 g | Freq: Once | INTRAVENOUS | Status: AC
  Administered 2024-03-29: 2 g via INTRAVENOUS
  Filled 2024-03-29: qty 50

## 2024-03-29 MED ORDER — GUAIFENESIN ER 600 MG PO TB12: 600.0000 mg | ORAL_TABLET | Freq: Two times a day (BID) | ORAL | Status: DC | PRN

## 2024-03-29 MED ORDER — ARFORMOTEROL TARTRATE 15 MCG/2ML IN NEBU
15.0000 ug | INHALATION_SOLUTION | Freq: Two times a day (BID) | RESPIRATORY_TRACT | Status: DC
  Administered 2024-03-29 – 2024-03-31 (×4): 15 ug via RESPIRATORY_TRACT
  Filled 2024-03-29 (×6): qty 2

## 2024-03-29 MED ORDER — BUPROPION HCL ER (XL) 150 MG PO TB24
150.0000 mg | ORAL_TABLET | Freq: Every day | ORAL | Status: DC
  Administered 2024-03-29 – 2024-03-31 (×3): 150 mg via ORAL
  Filled 2024-03-29 (×3): qty 1

## 2024-03-29 MED ORDER — ALBUTEROL SULFATE (2.5 MG/3ML) 0.083% IN NEBU: 2.5000 mg | INHALATION_SOLUTION | RESPIRATORY_TRACT | Status: DC | PRN

## 2024-03-29 MED ORDER — ORAL CARE MOUTH RINSE: 15.0000 mL | OROMUCOSAL | Status: DC | PRN

## 2024-03-29 MED ORDER — EZETIMIBE 10 MG PO TABS
10.0000 mg | ORAL_TABLET | Freq: Every day | ORAL | Status: DC
  Administered 2024-03-29 – 2024-03-31 (×3): 10 mg via ORAL
  Filled 2024-03-29 (×3): qty 1

## 2024-03-29 NOTE — Assessment & Plan Note (Signed)
 Acute respiratory failure with hypoxia Patient in acute respiratory distress arrived on CPAP transitioned to BiPAP upon arrival Continue BiPAP and wean as tolerated Schedule and as needed nebulized bronchodilators IV steroids, guaifenesin  Flutter valve

## 2024-03-29 NOTE — Assessment & Plan Note (Signed)
 Continue lisinopril and metoprolol ?

## 2024-03-29 NOTE — Plan of Care (Signed)
 Patient was seen and examined at bedside.  She was admitted due to respiratory failure, required BiPAP and elevated troponin, no chest pain.  Seen by cardiology, started Lasix  40 mg IV twice daily.  Patient feels improvement in the shortness of breath, just feeling tired, very little cough, no phlegm, complaining of draining sinuses. Denied any other complaints, we will continue current treatment and follow along.

## 2024-03-29 NOTE — Assessment & Plan Note (Signed)
 Continue home bupropion  and fluoxetine 

## 2024-03-29 NOTE — ED Triage Notes (Signed)
 Pt arrived via EMS from home for SOB. Pt does have a hx of COPD. Pt took her nebulizer treatment but states it did not help. Pt arrived on CPAP sating at 97%.

## 2024-03-29 NOTE — Progress Notes (Signed)
 PHARMACY - ANTICOAGULATION CONSULT NOTE  Pharmacy Consult for Heparin   Indication: chest pain/ACS  Allergies  Allergen Reactions   Honey Bee Venom [Bee Venom] Anaphylaxis   Diazepam Other (See Comments)    REACTION: pt states she gets very angry and abusive verbally on this medication    Patient Measurements: Height: 5' 7.01" (170.2 cm) Weight: 106.4 kg (234 lb 9.1 oz) IBW/kg (Calculated) : 61.62 HEPARIN  DW (KG): 85.8  Vital Signs: Temp: 97.9 F (36.6 C) (04/23 0734) Temp Source: Oral (04/23 0305) BP: 135/68 (04/23 0734) Pulse Rate: 86 (04/23 0734)  Labs: Recent Labs    03/29/24 0146 03/29/24 0319 03/29/24 0859 03/29/24 1010  HGB 13.2  --   --   --   HCT 38.8  --   --   --   PLT 146*  --   --   --   HEPARINUNFRC  --   --   --  0.47  CREATININE 0.80  --   --   --   TROPONINIHS 103* 451* 610* 514*   Estimated Creatinine Clearance: 78.6 mL/min (by C-G formula based on SCr of 0.8 mg/dL).  Medical History: Past Medical History:  Diagnosis Date   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 sting    Aortic stenosis    a. 2013: nl LV sys fxn, mild MR, no evidence of pulm htn; b. TTE 8/17: EF 60-65%, no RWMA, nl LV dia fxn,  mild AS, mod AI   CAD (coronary artery disease), native coronary artery 11/26/2020   Controlled type 2 diabetes mellitus with diabetic autonomic neuropathy, without long-term current use of insulin  (HCC) 08/10/2007   COPD (chronic obstructive pulmonary disease) (HCC) 10/30/2016   Depression    GAD (generalized anxiety disorder)    GERD (gastroesophageal reflux disease)    Hiatal hernia with gastroesophageal reflux 1997   Hyperlipidemia    Hypertension    Osteoporosis    S/P TAVR (transcatheter aortic valve replacement) 12/21/2023   s/p TAVR with a 34 mm Evolut FX via the TF approach by Dr. Lorie Rook & Dr. Honey Lusty   Tobacco abuse     Assessment: 72yo female with PMH significant for  DM, HTN, mild nonobstructive CAD on left heart cath 09/2023, COPD, aortic stenosis s/p TAVR  12/2023 admitted to ED with respiratory distress. Troponin trending upward and pharmacy consulted to start and manage heparin  infusion for ACS.  No prior anticoagulation noted.   Baseline hgb and plt appropriate to start heparin  infusion. CrCl = 78.7 ml/min  Date Time Results Comments 4/23 1010 HL 0.47 Therapeutic level with heparin  rate 1200 u/h  Goal of Therapy:  Heparin  level 0.3-0.7 units/ml Monitor platelets by anticoagulation protocol: Yes   Plan:  Continue heparin  infusion at 1200 units per hour Check HL in 8 hours to confirmed therapeutic rate and daily while on heparin  Continue to monitor H&H and platelets  Olympia Adelsberger Rodriguez-Guzman PharmD, BCPS 03/29/2024 11:20 AM

## 2024-03-29 NOTE — H&P (Signed)
 History and Physical    Patient: Amanda [name]:[id number] DOB: [birth year] DOA: 03/29/2024 DOS: the patient was seen and examined on 03/29/2024 PCP: Scherrie Curt, MD  Patient coming from: Home  Chief Complaint: No chief complaint on file.   HPI: Amanda [name] is a 73 y.o. female with medical history significant for DM, HTN, mild nonobstructive CAD on left heart cath 09/2023, COPD, aortic stenosis s/p TAVR 12/2023 being admitted with COPD exacerbation and elevated troponin. She presented by EMS for shortness of breath unrelieved with home nebulizers.  She arrived on CPAP by EMS.  States she has chest pain that feels like something heavy sitting on her chest.  Denies cough, fever or chills ED course and data review: Transition to BiPAP on arrival.  Tachycardic to 101, saturating at 98 on O2 at 2 L.  Afebrile with SBP in the 150s to 160s. Labs notable for the following: ABG on BiPAP within normal limits unremarkable CBC and CMP, normal lactic acid and negative respiratory viral panel Troponin 101, BNP pendingEKG, Personally viewed and interpreted showing sinus at 98 with RBBB Chest x-ray nonacute  Treatment in the ED: Solu-Medrol , magnesium  Heparin  bolus with infusion  Hospitalist consulted for admission.     Past Medical History:  Diagnosis Date   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 sting    Aortic stenosis    a. 2013: nl LV sys fxn, mild MR, no evidence of pulm htn; b. TTE 8/17: EF 60-65%, no RWMA, nl LV dia fxn,  mild AS, mod AI   CAD (coronary artery disease), native coronary artery 11/26/2020   Controlled type 2 diabetes mellitus with diabetic autonomic neuropathy, without long-term current use of insulin  (HCC) 08/10/2007   COPD (chronic obstructive pulmonary disease) (HCC) 10/30/2016   Depression    GAD (generalized anxiety disorder)    GERD (gastroesophageal reflux disease)    Hiatal hernia with gastroesophageal reflux 1997   Hyperlipidemia    Hypertension    Osteoporosis    S/P  TAVR (transcatheter aortic valve replacement) 12/21/2023   s/p TAVR with a 34 mm Evolut FX via the TF approach by Dr. Lorie Rook & Dr. Honey Lusty   Tobacco abuse    Past Surgical History:  Procedure Laterality Date   ABDOMINAL HYSTERECTOMY  1993   ANTERIOR CERVICAL DECOMP/DISCECTOMY FUSION N/A 10/26/2018   Procedure: ACDF C4-C5 C5-C6 C6-C7;  Surgeon: Gearl Keens, MD;  Location: [facility] OR;  Service: Neurosurgery;  Laterality: N/A;   APPENDECTOMY     BACK SURGERY     BREAST BIOPSY Right 2007   benign   CHOLECYSTECTOMY     COLONOSCOPY     30 years ago was normal per pt.    DILATION AND CURETTAGE OF UTERUS     ESOPHAGEAL DILATION     x 3   ESOPHAGOGASTRODUODENOSCOPY (EGD) WITH PROPOFOL  N/A 02/06/2022   Procedure: ESOPHAGOGASTRODUODENOSCOPY (EGD) WITH PROPOFOL ;  Surgeon: Luke Salaam, MD;  Location: [facility] ENDOSCOPY;  Service: Gastroenterology;  Laterality: N/A;   ESOPHAGOGASTRODUODENOSCOPY (EGD) WITH PROPOFOL  N/A 02/26/2022   Procedure: ESOPHAGOGASTRODUODENOSCOPY (EGD) WITH PROPOFOL ;  Surgeon: Luke Salaam, MD;  Location: [facility] ENDOSCOPY;  Service: Gastroenterology;  Laterality: N/A;  EGD+dilation per Dr. Antony Baumgartner   fractured leg Right    INCISION AND DRAINAGE Right 12/02/2021   Procedure: INCISION AND DRAINAGE;  Surgeon: Molli Angelucci, MD;  Location: [facility] ORS;  Service: Orthopedics;  Laterality: Right;   INTRAOPERATIVE TRANSTHORACIC ECHOCARDIOGRAM N/A 12/21/2023   Procedure: INTRAOPERATIVE TRANSTHORACIC ECHOCARDIOGRAM;  Surgeon: Kyra Phy, MD;  Location: [facility] INVASIVE CV LAB;  Service: Cardiovascular;  Laterality: N/A;   LEFT HEART CATH AND CORONARY ANGIOGRAPHY N/A 11/11/2020   Procedure: LEFT HEART CATH AND CORONARY ANGIOGRAPHY;  Surgeon: Wenona Hamilton, MD;  Location: [facility] INVASIVE CV LAB;  Service: Cardiovascular;  Laterality: N/A;   OOPHORECTOMY     OVARIAN CYST REMOVAL  1972   RIGHT/LEFT HEART CATH AND CORONARY ANGIOGRAPHY Bilateral 10/04/2023   Procedure: RIGHT/LEFT HEART CATH AND CORONARY  ANGIOGRAPHY;  Surgeon: Wenona Hamilton, MD;  Location: [facility] INVASIVE CV LAB;  Service: Cardiovascular;  Laterality: Bilateral;   SALIVARY GLAND SURGERY     TONSILLECTOMY     4 yoa   TOTAL KNEE ARTHROPLASTY Left 12/24/2015   Procedure: LEFT TOTAL KNEE ARTHROPLASTY;  Surgeon: Jasmine Mesi, MD;  Location: [facility] OR;  Service: Orthopedics;  Laterality: Left;   TOTAL KNEE ARTHROPLASTY Right 12/30/2017   Procedure: RIGHT TOTAL KNEE ARTHROPLASTY;  Surgeon: Jasmine Mesi, MD;  Location: [facility] OR;  Service: Orthopedics;  Laterality: Right;   WRIST FRACTURE SURGERY  11/2008   Social History:  reports that she has quit smoking. Her smoking use included cigarettes. She started smoking about 14 months ago. She has a 33.8 pack-year smoking history. She has never used smokeless tobacco. She reports current alcohol use. She reports that she does not use drugs.  Allergies  Allergen Reactions   Honey Bee Venom [Bee Venom] Anaphylaxis   Diazepam Other (See Comments)    REACTION: pt states she gets very angry and abusive verbally on this medication     Family History  Problem Relation Age of Onset   Hyperlipidemia Mother    Arthritis Mother    Diabetes Mother    Heart disease Mother    Colon polyps Mother    Cancer Paternal Grandmother        breast   Hypercholesterolemia Sister    Colon polyps Sister    Breast cancer Sister    Colon cancer Neg Hx    Esophageal cancer Neg Hx    Rectal cancer Neg Hx    Stomach cancer Neg Hx    Pancreatic cancer Neg Hx     Prior to Admission medications   Medication Sig Start Date End Date Taking? Authorizing Provider  acetaminophen  (TYLENOL ) 500 MG tablet Take 1,000 mg by mouth every 8 (eight) hours as needed for mild pain (pain score 1-3) or moderate pain (pain score 4-6).    [provider]  albuterol  (VENTOLIN  HFA) 108 (90 Base) MCG/ACT inhaler Inhale 2 puffs into the lungs every 4 (four) hours as needed for wheezing or shortness of breath.  08/12/22   Copland, Jolena Nay, MD  amitriptyline  (ELAVIL ) 50 MG tablet Take 0.5 tablets (25 mg total) by mouth at bedtime. 09/21/23   Copland, Jolena Nay, MD  Ascorbic Acid  (VITAMIN C ) 1000 MG tablet Take 1,000 mg by mouth daily.    [provider]  aspirin  EC 81 MG tablet Take 81 mg by mouth daily.    [provider]  b complex vitamins capsule Take 1 capsule by mouth daily.    [provider]  buPROPion  (WELLBUTRIN  XL) 150 MG 24 hr tablet Take 1 tablet (150 mg total) by mouth daily. 11/01/23   Copland, Jolena Nay, MD  calcium  carbonate (TUMS - DOSED IN MG ELEMENTAL CALCIUM ) 500 MG chewable tablet Chew 2 tablets by mouth daily as needed for indigestion or heartburn.    [provider]  Cholecalciferol  (VITAMIN D ) 1000 UNITS capsule Take 2,000 Units by mouth daily.    [provider]  clonazePAM  (KLONOPIN ) 0.5 MG tablet Take 1 tablet by mouth twice daily as needed 02/04/24   Copland, Jolena Nay, MD  cyanocobalamin  (VITAMIN B12) 1000 MCG/ML injection Inject into the muscle. 02/03/24   [provider]  cyclobenzaprine  (FLEXERIL ) 10 MG tablet Take 1 tablet by mouth three times daily as needed for muscle spasm 08/27/23   Copland, Jolena Nay, MD  dimenhyDRINATE (DRAMAMINE) 50 MG tablet Take 50 mg by mouth every 8 (eight) hours as needed for nausea or dizziness.    [provider]  donepezil  (ARICEPT ) 5 MG tablet TAKE 1 TABLET BY MOUTH AT BEDTIME 01/21/24   Copland, Jolena Nay, MD  ezetimibe  (ZETIA ) 10 MG tablet Take 1 tablet by mouth once daily 03/23/24   Roark Chick, PA-C  FLUoxetine  (PROZAC ) 40 MG capsule Take 80 mg by mouth M, W, F and 40 mg by mouth Tues, Thurs, Sat and Sunday 02/28/24   Copland, Jolena Nay, MD  fluticasone  (FLONASE) 50 MCG/ACT nasal spray Place 2 sprays into both nostrils daily as needed for rhinitis. 01/15/23   [provider]  fluticasone -salmeterol (ADVAIR ) 250-50 MCG/ACT AEPB Inhale 1 puff into the lungs in the morning and at bedtime.  02/23/24   Copland, Jolena Nay, MD  gabapentin  (NEURONTIN ) 300 MG capsule Take 1 capsule (300 mg total) by mouth 2 (two) times daily. 01/21/24   Ardia Kraft, PA-C  guaiFENesin  (MUCINEX ) 600 MG 12 hr tablet Take 600 mg by mouth 2 (two) times daily as needed for to loosen phlegm.    [provider]  lisinopril  (ZESTRIL ) 5 MG tablet Take 1 tablet (5 mg total) by mouth daily. 01/21/24   Ardia Kraft, PA-C  metoprolol  tartrate (LOPRESSOR ) 25 MG tablet TAKE 1/2 TABLET TWICE DAILY 11/01/23   Wenona Hamilton, MD  Multiple Vitamin (MULTIVITAMIN) tablet Take 1 tablet by mouth daily.    [provider]  naproxen  sodium (ALEVE ) 220 MG tablet Take 220 mg by mouth in the morning.    [provider]  ondansetron  (ZOFRAN -ODT) 4 MG disintegrating tablet Take 1 tablet (4 mg total) by mouth every 8 (eight) hours as needed for nausea or vomiting. 12/20/21   Menshew, Raye Cai, PA-C  pantoprazole  (PROTONIX ) 40 MG tablet TAKE 1 TABLET 30 MINUTES PRIOR TO BREAKFAST 06/01/23   Copland, Jolena Nay, MD  rosuvastatin  (CRESTOR ) 40 MG tablet Take 1 tablet by mouth once daily 01/24/24   Copland, Jolena Nay, MD  Simethicone  125 MG CAPS Take 125 mg by mouth daily as needed (gas).    [provider]  vitamin E 180 MG (400 UNITS) capsule Take 400 Units by mouth daily.    [provider]    Physical Exam: Vitals:   03/29/24 0135 03/29/24 0213 03/29/24 0304 03/29/24 0305  BP:  (!) 159/80  (!) 169/77  Pulse: (!) 101   97  Resp:    19  Temp:    97.8 F (36.6 C)  TempSrc:    Oral  SpO2: 98%   98%  Weight:   106.6 kg   Height:   5\' 7"  (1.702 m)    Physical Exam Vitals and nursing note reviewed.  Constitutional:      Comments: Patient with conversational dyspnea.  On BiPAP  HENT:     Head: Normocephalic and atraumatic.  Cardiovascular:     Rate and Rhythm: Regular rhythm. Tachycardia present.     Heart sounds: Normal heart sounds.  Pulmonary:     Breath sounds:  Wheezing present.  Abdominal:  Palpations: Abdomen is soft.     Tenderness: There is no abdominal tenderness.  Neurological:     Mental Status: Mental status is at baseline.     Labs on Admission: I have personally reviewed following labs and imaging studies  CBC: Recent Labs  Lab 03/29/24 0146  WBC 7.1  NEUTROABS 5.1  HGB 13.2  HCT 38.8  MCV 97.2  PLT 146*   Basic Metabolic Panel: Recent Labs  Lab 03/29/24 0146  NA 137  K 4.0  CL 105  CO2 23  GLUCOSE 172*  BUN 10  CREATININE 0.80  CALCIUM  9.2  MG 2.1   GFR: Estimated Creatinine Clearance: 78.7 mL/min (by C-G formula based on SCr of 0.8 mg/dL). Liver Function Tests: Recent Labs  Lab 03/29/24 0146  AST 24  ALT 14  ALKPHOS 37*  BILITOT 0.9  PROT 6.9  ALBUMIN 4.1   No results for input(s): "LIPASE", "AMYLASE" in the last 168 hours. No results for input(s): "AMMONIA" in the last 168 hours. Coagulation Profile: No results for input(s): "INR", "PROTIME" in the last 168 hours. Cardiac Enzymes: No results for input(s): "CKTOTAL", "CKMB", "CKMBINDEX", "TROPONINI" in the last 168 hours. BNP (last 3 results) No results for input(s): "PROBNP" in the last 8760 hours. HbA1C: No results for input(s): "HGBA1C" in the last 72 hours. CBG: No results for input(s): "GLUCAP" in the last 168 hours. Lipid Profile: No results for input(s): "CHOL", "HDL", "LDLCALC", "TRIG", "CHOLHDL", "LDLDIRECT" in the last 72 hours. Thyroid  Function Tests: No results for input(s): "TSH", "T4TOTAL", "FREET4", "T3FREE", "THYROIDAB" in the last 72 hours. Anemia Panel: No results for input(s): "VITAMINB12", "FOLATE", "FERRITIN", "TIBC", "IRON", "RETICCTPCT" in the last 72 hours. Urine analysis:    Component Value Date/Time   COLORURINE YELLOW 12/17/2023 1110   APPEARANCEUR HAZY (A) 12/17/2023 1110   LABSPEC 1.008 12/17/2023 1110   PHURINE 6.0 12/17/2023 1110   GLUCOSEU NEGATIVE 12/17/2023 1110   HGBUR SMALL (A) 12/17/2023 1110    BILIRUBINUR NEGATIVE 12/17/2023 1110   KETONESUR NEGATIVE 12/17/2023 1110   PROTEINUR NEGATIVE 12/17/2023 1110   NITRITE NEGATIVE 12/17/2023 1110   LEUKOCYTESUR TRACE (A) 12/17/2023 1110    Radiological Exams on Admission: DG Chest Port 1 View Result Date: 03/29/2024 CLINICAL DATA:  Shortness of breath. EXAM: PORTABLE CHEST 1 VIEW COMPARISON:  December 17, 2023 FINDINGS: The heart size and mediastinal contours are within normal limits. There is marked severity calcification of the thoracic aorta. An artificial aortic valve is also seen. The lungs are hyperinflated with stable elevation of the posterior aspect of the mid left hemithorax. Mild, diffuse, chronic appearing increased interstitial lung markings are noted. There is no evidence of acute infiltrate, pleural effusion or pneumothorax. Postoperative changes are seen within the lower cervical spine. Multilevel degenerative changes are present throughout the thoracic spine. IMPRESSION: Stable chronic and postoperative changes without evidence of acute cardiopulmonary disease. Electronically Signed   By: Virgle Grime M.D.   On: 03/29/2024 02:06     Data Reviewed: Relevant notes from primary care and specialist visits, past discharge summaries as available in EHR, including Care Everywhere. Prior diagnostic testing as pertinent to current admission diagnoses Updated medications and problem lists for reconciliation ED course, including vitals, labs, imaging, treatment and response to treatment Triage notes, nursing and pharmacy notes and ED provider's notes Notable results as noted in HPI   Assessment and Plan: * COPD with acute exacerbation (HCC) Acute respiratory failure with hypoxia Patient in acute respiratory distress arrived on CPAP transitioned to BiPAP  upon arrival Continue BiPAP and wean as tolerated Schedule and as needed nebulized bronchodilators IV steroids, guaifenesin  Flutter valve  Elevated troponin Mild  nonobstructive CAD on [facility] 09/2023 Elevated troponin likely secondary to demand ischemia Patient was started on heparin  infusion in the ED Will continue heparin  infusion pending cardiology consult, despite low suspicion for ACS Continue aspirin , metoprolol , rosuvastatin , lisinopril  Continue to trend troponins Cardiology consult   GAD (generalized anxiety disorder) Continue home bupropion  and fluoxetine   Severe aortic stenosis s/p TAVR 12/2023 No acute issues suspected  Essential hypertension Continue lisinopril  and metoprolol   Controlled type 2 diabetes mellitus with diabetic autonomic neuropathy, without long-term current use of insulin  (HCC) Sliding scale insulin  coverage    DVT prophylaxis: heparin  infusion  Consults: [facility] cardiology  Advance Care Planning:   Code Status: Prior   Family Communication: Daughter at bedside  Disposition Plan: Back to previous home environment  Severity of Illness: The appropriate patient status for this patient is OBSERVATION. Observation status is judged to be reasonable and necessary in order to provide the required intensity of service to ensure the patient's safety. The patient's presenting symptoms, physical exam findings, and initial radiographic and laboratory data in the context of their medical condition is felt to place them at decreased risk for further clinical deterioration. Furthermore, it is anticipated that the patient will be medically stable for discharge from the hospital within 2 midnights of admission.   Author: Lanetta Pion, MD 03/29/2024 3:22 AM  For on call review [email or web address].

## 2024-03-29 NOTE — Progress Notes (Signed)
 PHARMACY - ANTICOAGULATION CONSULT NOTE  Pharmacy Consult for Heparin   Indication: chest pain/ACS  Allergies  Allergen Reactions   Honey Bee Venom [Bee Venom] Anaphylaxis   Diazepam Other (See Comments)    REACTION: pt states she gets very angry and abusive verbally on this medication     Patient Measurements: Height: 5\' 7"  (170.2 cm) Weight: 106.6 kg (235 lb) IBW/kg (Calculated) : 61.6 HEPARIN  DW (KG): 85.9  Vital Signs: Temp: 97.8 F (36.6 C) (04/23 0305) Temp Source: Oral (04/23 0305) BP: 169/77 (04/23 0305) Pulse Rate: 97 (04/23 0305)  Labs: Recent Labs    03/29/24 0146  HGB 13.2  HCT 38.8  PLT 146*  CREATININE 0.80  TROPONINIHS 103*    Estimated Creatinine Clearance: 78.7 mL/min (by C-G formula based on SCr of 0.8 mg/dL).   Medical History: Past Medical History:  Diagnosis Date   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 sting    Aortic stenosis    a. 2013: nl LV sys fxn, mild MR, no evidence of pulm htn; b. TTE 8/17: EF 60-65%, no RWMA, nl LV dia fxn,  mild AS, mod AI   CAD (coronary artery disease), native coronary artery 11/26/2020   Controlled type 2 diabetes mellitus with diabetic autonomic neuropathy, without long-term current use of insulin  (HCC) 08/10/2007   COPD (chronic obstructive pulmonary disease) (HCC) 10/30/2016   Depression    GAD (generalized anxiety disorder)    GERD (gastroesophageal reflux disease)    Hiatal hernia with gastroesophageal reflux 1997   Hyperlipidemia    Hypertension    Osteoporosis    S/P TAVR (transcatheter aortic valve replacement) 12/21/2023   s/p TAVR with a 34 mm Evolut FX via the TF approach by Dr. Lorie Rook & Dr. Honey Lusty   Tobacco abuse     Medications:  (Not in a hospital admission)   Assessment: Pharmacy consulted to dose heparin  in this 73 year old female admitted with ACS/NSTEMI.  No prior anticoag noted. CrCl = 78.7 ml/min   Goal of Therapy:  Heparin  level 0.3-0.7 units/ml Monitor platelets by anticoagulation protocol:  Yes   Plan:  Give 4000 units bolus x 1 Start heparin  infusion at 1200 units/hr Check anti-Xa level in 8 hours and daily while on heparin  Continue to monitor H&H and platelets  Glennis Borger D 03/29/2024,3:11 AM

## 2024-03-29 NOTE — Evaluation (Signed)
 Occupational Therapy Evaluation Patient Details Name: Amanda [name] MRN: [id number] DOB: 1951-12-05 Today's Date: 03/29/2024   History of Present Illness   Pt is a 73 yo female that presented to the ED for SOB, admitted for COPD exacerbation and elevated troponin. PMH of DM, HTN, mild nonobstructive CAD on left heart cath 09/2023, COPD, aortic stenosis s/p TAVR   Clinical Impressions Amanda [name] was seen for OT evaluation this date. Prior to hospital admission, pt was MOD I using rollator. Pt lives with daughter. Pt currently requires MIN A don underwear, 1 lateral loss of balance with head turn, corrects with UE support. SBA + RW for functional mobility ~200 ft with 1 standing rest break. SpO2 97% on 2L [place] with activity. Pt would benefit from skilled OT to address noted impairments and functional limitations (see below for any additional details). Upon hospital discharge, recommend no OT follow up.    If plan is discharge home, recommend the following:   Help with stairs or ramp for entrance     Functional Status Assessment   Patient has had a recent decline in their functional status and demonstrates the ability to make significant improvements in function in a reasonable and predictable amount of time.     Equipment Recommendations   None recommended by OT     Recommendations for Other Services         Precautions/Restrictions   Precautions Precautions: None Recall of Precautions/Restrictions: Intact Restrictions Weight Bearing Restrictions Per Provider Order: No     Mobility Bed Mobility Overal bed mobility: Modified Independent                  Transfers Overall transfer level: Needs assistance Equipment used: Rolling walker (2 wheels) Transfers: Sit to/from Stand Sit to Stand: Supervision                  Balance Overall balance assessment: Needs assistance Sitting-balance support: No upper extremity supported, Feet supported Sitting  balance-Leahy Scale: Normal     Standing balance support: No upper extremity supported, During functional activity Standing balance-Leahy Scale: Fair Standing balance comment: 1 lateral LOB with head turns                           ADL either performed or assessed with clinical judgement   ADL Overall ADL's : Needs assistance/impaired                                       General ADL Comments: MIN A don underwear. SBA + RW for functional mobility ~200 ft with 1 standing rest break.       Pertinent Vitals/Pain Pain Assessment Pain Assessment: No/denies pain     Extremity/Trunk Assessment Upper Extremity Assessment Upper Extremity Assessment: Overall WFL for tasks assessed   Lower Extremity Assessment Lower Extremity Assessment: Generalized weakness       Communication Communication Communication: No apparent difficulties   Cognition Arousal: Alert Behavior During Therapy: WFL for tasks assessed/performed Cognition: No apparent impairments                               Following commands: Intact       Cueing  General Comments      SpO2 97% on 2L [place] with activity   Exercises  Shoulder Instructions      Home Living Family/patient expects to be discharged to:: Private residence Living Arrangements: Children Available Help at Discharge: Family;Available PRN/intermittently Type of Home: House Home Access: Stairs to enter;Ramped entrance Entrance Stairs-Number of Steps: 3 Entrance Stairs-Rails: Can reach both Home Layout: One level               Home Equipment: Rollator (4 wheels)          Prior Functioning/Environment Prior Level of Function : Independent/Modified Independent             Mobility Comments: currently going to cardiac rehab      OT Problem List: Decreased activity tolerance;Impaired balance (sitting and/or standing)   OT Treatment/Interventions: Self-care/ADL  training;Therapeutic exercise;Energy conservation;DME and/or AE instruction;Therapeutic activities      OT Goals(Current goals can be found in the care plan section)   Acute Rehab OT Goals Patient Stated Goal: to go home OT Goal Formulation: With patient Time For Goal Achievement: 04/12/24 Potential to Achieve Goals: Good ADL Goals Pt Will Perform Grooming: with modified independence;standing Pt Will Perform Lower Body Dressing: with modified independence;sit to/from stand Pt Will Transfer to Toilet: with modified independence;ambulating;regular height toilet   OT Frequency:  Min 1X/week    Co-evaluation              AM-PAC OT "6 Clicks" Daily Activity     Outcome Measure Help from another person eating meals?: None Help from another person taking care of personal grooming?: A Little Help from another person toileting, which includes using toliet, bedpan, or urinal?: A Little Help from another person bathing (including washing, rinsing, drying)?: A Little Help from another person to put on and taking off regular upper body clothing?: None Help from another person to put on and taking off regular lower body clothing?: A Little 6 Click Score: 20   End of Session Equipment Utilized During Treatment: Rolling walker (2 wheels);Gait belt Nurse Communication: Mobility status  Activity Tolerance: Patient tolerated treatment well Patient left: in chair;with call bell/phone within reach;with family/visitor present  OT Visit Diagnosis: Other abnormalities of gait and mobility (R26.89);Muscle weakness (generalized) (M62.81)                Time: 8413-2440 OT Time Calculation (min): 21 min Charges:  OT General Charges $OT Visit: 1 Visit OT Evaluation $OT Eval Moderate Complexity: 1 Mod OT Treatments $Self Care/Home Management : 8-22 mins  Gordan Latina, M.S. OTR/L  03/29/24, 1:01 PM  ascom [phone]

## 2024-03-29 NOTE — Consult Note (Signed)
 Cardiology Consultation   Patient ID: [name] MRN: [id number]; DOB: Jan 11, 1951  Admit date: 03/29/2024 Date of Consult: 03/29/2024  PCP:  Scherrie Curt, MD   [facility] Providers Cardiologist:  Antionette Kirks, MD        Patient Profile:   Amanda [name] is a 73 y.o. female with a hx of RBBB, diet controlled DM, HTN, HLD, mobid obesity, CKD II, non-obstructive CAD (LHC 09/2023), COPD with prior tobacco use, mild cognitive impairment and severe aortic stenosis s/p TAVR (12/2023) who is being seen 03/29/2024 for the evaluation of elevated troponin at the request of Dr. Vallarie Gauze.  History of Present Illness:     [name] 08/2018 which showed calcium  score 521 with nonobstructive disease involving the LAD and RCA that were not significant by CT FFR.  Echo in 08/2019 demonstrated an EF of 60 to 65% with moderate aortic stenosis with a mean gradient of 19 mmHg and a valve area of 1.28 cm.  Repeat echo in 11/2020 showed an EF of 60 to 65%, G1 DD, and moderate AAS with a mean gradient of 22 mmHg and a valve area of 1.29 cm grade.  She was admitted to the hospital 11/2020 with chest pain and worsening exertional dyspnea.  R/LHC showed mild nonobstructive CAD with RHC showing normal filling pressures, normal pulmonary pressure, and normal cardiac output.  Aortic stenosis was moderate with a mean gradient 24 mmHg and a valve area of 1.44 cm.  Carotid artery ultrasound in 06/2021 showed 1 to 39% bilateral ICA stenosis with antegrade flow of the bilateral vertebral arteries and normal flow hemodynamics of the bilateral subclavian arteries.  She fell in 07/2021 leading to 2 broken ribs.  She was admitted 11/2021 with right hand cellulitis/abscess due to an infected cat bite.  Echo during that admission demonstrated EF of 65 to 70%, moderate aortic valve stenosis with a mean gradient of 29.2 mmHg.  She was seen 07/2022 and forgot to mention at her prior visit that she had 2 episodes of  chest discomfort that improved with husbands sublingual nitroglycerin .  She also notes some lower extremity heaviness with ambulation.  Lexiscan  MPI on 07/2022 showed no evidence of ischemia or infarction and overall low risk.  ABI normal bilaterally 08/2022.  Echo 11/2022 showed an EF of 65 to 70%, moderate aortic stenosis with a mean gradient of 30 mmHg and a valve area of 1.53 cm.  She was seen in the office 08/2023 with dizziness and lightheadedness.  Echo 09/2023 showed EF 60 to 65% with severe aortic stenosis with a mean gradient of 36 mmHg and a valve area of 0.7 cm.  She was again seen in follow-up with but 09/2023 and reported increasing shortness of breath with exertion.  Subsequent R/LHC on 09/2023 showed mild nonobstructive disease, moderate to severe aortic stenosis with a mean gradient of 32 mmHg and a valve area of 1.1 cm.  It was recommended that patient be evaluated for TAVR.  Cardiac CT confirmed severe AS.  She underwent TAVR 12/2023.  She was most recently seen in follow-up with the heart valve clinic 01/2024 and overall is doing well from a cardiac perspective.   Patient reports increasing shortness of breath with exertion for the past week or so. She attends cardiac rehab s/p TAVR and has noticed that she have been getting more short of breath with less activity recently. She also endorses chest heaviness that has been intermittent since after her surgery in January. She denies palpitations,  lower extremity swelling, orthopnea, PND, cough, fever, nausea, diarrhea, and bleeding. Late yesterday evening her dyspnea continued to worsen and was unrelieved by home nebulizer when she decided to call EMS. In the ED, BP 159/80 HR 101, satting at 98% on CPAP with otherwise normal vital signs. CMP and CBC largely unremarkable. Blood gas within normal limits. Respiratory panel negative. Troponin mildly elevated at 103>451. EKG without acute ischemic changes. CXR without acute cardiopulmonary disease. She  was given DuoNeb, Solu-Medrol , and started on IV heparin . Transitioned to from CPAP to BiPAP. Cardiology was asked to consult for chest pain and elevated troponin. On my exam, patient has been transitioned to 2L [place].   Past Medical History:  Diagnosis Date   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 sting    Aortic stenosis    a. 2013: nl LV sys fxn, mild MR, no evidence of pulm htn; b. TTE 8/17: EF 60-65%, no RWMA, nl LV dia fxn,  mild AS, mod AI   CAD (coronary artery disease), native coronary artery 11/26/2020   Controlled type 2 diabetes mellitus with diabetic autonomic neuropathy, without long-term current use of insulin  (HCC) 08/10/2007   COPD (chronic obstructive pulmonary disease) (HCC) 10/30/2016   Depression    GAD (generalized anxiety disorder)    GERD (gastroesophageal reflux disease)    Hiatal hernia with gastroesophageal reflux 1997   Hyperlipidemia    Hypertension    Osteoporosis    S/P TAVR (transcatheter aortic valve replacement) 12/21/2023   s/p TAVR with a 34 mm Evolut FX via the TF approach by Dr. Lorie Rook & Dr. Honey Lusty   Tobacco abuse     Past Surgical History:  Procedure Laterality Date   ABDOMINAL HYSTERECTOMY  1993   ANTERIOR CERVICAL DECOMP/DISCECTOMY FUSION N/A 10/26/2018   Procedure: ACDF C4-C5 C5-C6 C6-C7;  Surgeon: Gearl Keens, MD;  Location: [facility] OR;  Service: Neurosurgery;  Laterality: N/A;   APPENDECTOMY     BACK SURGERY     BREAST BIOPSY Right 2007   benign   CHOLECYSTECTOMY     COLONOSCOPY     30 years ago was normal per pt.    DILATION AND CURETTAGE OF UTERUS     ESOPHAGEAL DILATION     x 3   ESOPHAGOGASTRODUODENOSCOPY (EGD) WITH PROPOFOL  N/A 02/06/2022   Procedure: ESOPHAGOGASTRODUODENOSCOPY (EGD) WITH PROPOFOL ;  Surgeon: Luke Salaam, MD;  Location: [facility] ENDOSCOPY;  Service: Gastroenterology;  Laterality: N/A;   ESOPHAGOGASTRODUODENOSCOPY (EGD) WITH PROPOFOL  N/A 02/26/2022   Procedure: ESOPHAGOGASTRODUODENOSCOPY (EGD) WITH PROPOFOL ;  Surgeon: Luke Salaam, MD;  Location: [facility]  ENDOSCOPY;  Service: Gastroenterology;  Laterality: N/A;  EGD+dilation per Dr. Antony Baumgartner   fractured leg Right    INCISION AND DRAINAGE Right 12/02/2021   Procedure: INCISION AND DRAINAGE;  Surgeon: Molli Angelucci, MD;  Location: [facility] ORS;  Service: Orthopedics;  Laterality: Right;   INTRAOPERATIVE TRANSTHORACIC ECHOCARDIOGRAM N/A 12/21/2023   Procedure: INTRAOPERATIVE TRANSTHORACIC ECHOCARDIOGRAM;  Surgeon: Kyra Phy, MD;  Location: [facility] INVASIVE CV LAB;  Service: Cardiovascular;  Laterality: N/A;   LEFT HEART CATH AND CORONARY ANGIOGRAPHY N/A 11/11/2020   Procedure: LEFT HEART CATH AND CORONARY ANGIOGRAPHY;  Surgeon: Wenona Hamilton, MD;  Location: [facility] INVASIVE CV LAB;  Service: Cardiovascular;  Laterality: N/A;   OOPHORECTOMY     OVARIAN CYST REMOVAL  1972   RIGHT/LEFT HEART CATH AND CORONARY ANGIOGRAPHY Bilateral 10/04/2023   Procedure: RIGHT/LEFT HEART CATH AND CORONARY ANGIOGRAPHY;  Surgeon: Wenona Hamilton, MD;  Location: [facility] INVASIVE CV LAB;  Service: Cardiovascular;  Laterality: Bilateral;   SALIVARY  GLAND SURGERY     TONSILLECTOMY     4 yoa   TOTAL KNEE ARTHROPLASTY Left 12/24/2015   Procedure: LEFT TOTAL KNEE ARTHROPLASTY;  Surgeon: Jasmine Mesi, MD;  Location: [facility] OR;  Service: Orthopedics;  Laterality: Left;   TOTAL KNEE ARTHROPLASTY Right 12/30/2017   Procedure: RIGHT TOTAL KNEE ARTHROPLASTY;  Surgeon: Jasmine Mesi, MD;  Location: [facility] OR;  Service: Orthopedics;  Laterality: Right;   WRIST FRACTURE SURGERY  11/2008       Inpatient Medications: Scheduled Meds:  aspirin  EC  81 mg Oral Daily   buPROPion   150 mg Oral Daily   donepezil   5 mg Oral QHS   ezetimibe   10 mg Oral Daily   FLUoxetine   40 mg Oral Daily   gabapentin   300 mg Oral BID   insulin  aspart  0-15 Units Subcutaneous TID WC   insulin  aspart  0-5 Units Subcutaneous QHS   ipratropium-albuterol   3 mL Nebulization Q6H   lisinopril   5 mg Oral Daily   methylPREDNISolone  (SOLU-MEDROL ) injection  40 mg  Intravenous Q12H   Followed by   Cecily Cohen ON 03/30/2024] predniSONE   40 mg Oral Q breakfast   metoprolol  tartrate  25 mg Oral BID   rosuvastatin   40 mg Oral Daily   Continuous Infusions:  heparin  1,200 Units/hr (03/29/24 0520)   PRN Meds: acetaminophen , albuterol , clonazePAM , guaiFENesin , nitroGLYCERIN , ondansetron  (ZOFRAN ) IV  Allergies:    Allergies  Allergen Reactions   Honey Bee Venom [Bee Venom] Anaphylaxis   Diazepam Other (See Comments)    REACTION: pt states she gets very angry and abusive verbally on this medication     Social History:   Social History   Socioeconomic History   Marital status: Married    Spouse name: Not on file   Number of children: 2   Years of education: Not on file   Highest education level: Some college, no degree  Occupational History   Occupation: Insurance claims handler: [employer]  Tobacco Use   Smoking status: Former    Average packs/day: 0.8 packs/day for 45.0 years (33.8 ttl pk-yrs)    Types: Cigarettes    Start date: 01/2023   Smokeless tobacco: Never   Tobacco comments:    Does not on starting back smoking.  Vaping Use   Vaping status: Never Used  Substance and Sexual Activity   Alcohol use: Yes    Alcohol/week: 0.0 standard drinks of alcohol    Comment: glass of wine twice a month    Drug use: No   Sexual activity: Not Currently  Other Topics Concern   Not on file  Social History Narrative   Not on file   Social Drivers of Health   Financial Resource Strain: High Risk (02/21/2024)   Overall Financial Resource Strain (CARDIA)    Difficulty of Paying Living Expenses: Hard  Food Insecurity: No Food Insecurity (03/29/2024)   Hunger Vital Sign    Worried About Running Out of Food in the Last Year: Never true    Ran Out of Food in the Last Year: Never true  Recent Concern: Food Insecurity - Food Insecurity Present (02/21/2024)   Hunger Vital Sign    Worried About Running Out of Food in  the Last Year: Sometimes true    Ran Out of Food in the Last Year: Never true  Transportation Needs: No Transportation Needs (03/29/2024)   PRAPARE - Transportation    Lack of Transportation (Medical): No    Lack  of Transportation (Non-Medical): No  Physical Activity: Unknown (02/21/2024)   Exercise Vital Sign    Days of Exercise per Week: 0 days    Minutes of Exercise per Session: Not on file  Stress: Stress Concern Present (02/21/2024)   [employer] of Occupational Health - Occupational Stress Questionnaire    Feeling of Stress : Very much  Social Connections: Moderately Isolated (03/29/2024)   Social Connection and Isolation Panel [NHANES]    Frequency of Communication with Friends and Family: Twice a week    Frequency of Social Gatherings with Friends and Family: Once a week    Attends Religious Services: 1 to 4 times per year    Active Member of [employer] or Organizations: No    Attends Banker Meetings: Patient declined    Marital Status: Widowed  Intimate Partner Violence: Not At Risk (03/29/2024)   Humiliation, Afraid, Rape, and Kick questionnaire    Fear of Current or Ex-Partner: No    Emotionally Abused: No    Physically Abused: No    Sexually Abused: No    Family History:    Family History  Problem Relation Age of Onset   Hyperlipidemia Mother    Arthritis Mother    Diabetes Mother    Heart disease Mother    Colon polyps Mother    Cancer Paternal Grandmother        breast   Hypercholesterolemia Sister    Colon polyps Sister    Breast cancer Sister    Colon cancer Neg Hx    Esophageal cancer Neg Hx    Rectal cancer Neg Hx    Stomach cancer Neg Hx    Pancreatic cancer Neg Hx      ROS:  Please see the history of present illness.   Physical Exam/Data:   Vitals:   03/29/24 0500 03/29/24 0515 03/29/24 0521 03/29/24 0600  BP: 132/77  (!) 155/60   Pulse: 94  95   Resp: 17  18 16   Temp:   97.9 F (36.6 C)   TempSrc:      SpO2: 98%  97%    Weight:  106.4 kg    Height:  5' 7.01" (1.702 m)      Intake/Output Summary (Last 24 hours) at 03/29/2024 0728 Last data filed at 03/29/2024 0543 Gross per 24 hour  Intake 109.39 ml  Output --  Net 109.39 ml      03/29/2024    5:15 AM 03/29/2024    3:04 AM 02/27/2024    3:05 PM  Last 3 Weights  Weight (lbs) 234 lb 9.1 oz 235 lb 235 lb  Weight (kg) 106.4 kg 106.595 kg 106.595 kg     Body mass index is 36.73 kg/m.  General:  Well nourished, well developed, in no acute distress HEENT: normal Neck: no JVD Vascular: No carotid bruits; Distal pulses 2+ bilaterally Cardiac:  normal S1, S2; RRR; no murmur  Lungs: no respiratory distress, reduced breath sounds with crackles Abd: soft, nontender, no hepatomegaly  Ext: no edema Skin: warm and dry  Psych:  Normal affect   EKG:  The EKG was personally reviewed and demonstrates:  Sinus tachycardia with RBBB, rate 106 bpm Telemetry:  Telemetry was personally reviewed and demonstrates:  Sinus rhythm  Relevant CV Studies:  01/21/2024 Echo complete 1. Left ventricular ejection fraction, by estimation, is 65 to 70%. Left  ventricular ejection fraction by 3D volume is 60 %. The left ventricle has  normal function. The left ventricle has no regional  wall motion  abnormalities. There is mild concentric  left ventricular hypertrophy. Left ventricular diastolic parameters are  consistent with Grade I diastolic dysfunction (impaired relaxation). The  average left ventricular global longitudinal strain is -19.2 %. The global  longitudinal strain is normal.   2. Right ventricular systolic function is normal. The right ventricular  size is normal.   3. Left atrial size was mildly dilated.   4. The mitral valve is normal in structure. Trivial mitral valve  regurgitation. No evidence of mitral stenosis.   5. The aortic valve is normal in structure. Aortic valve regurgitation is  not visualized. No aortic stenosis is present. There is a 34  Medtronic  CoreValve-Evolut Pro prosthetic (TAVR) valve present in the aortic  position. Procedure Date: 12/21/2023.  Aortic valve area, by VTI measures 3.19 cm. Aortic valve mean gradient  measures 6.0 mmHg. Aortic valve Vmax measures 1.74 m/s.   6. The inferior vena cava is normal in size with greater than 50%  respiratory variability, suggesting right atrial pressure of 3 mmHg.   10/04/2023 R/LHC 1.  Mild nonobstructive coronary artery disease. 2.  Moderate to severe aortic stenosis with mean gradient of 32 mmHg and valve area of 1.1 cm. 3.  Right heart catheterization showed normal RA pressure, mildly elevated wedge pressure, minimal pulmonary hypertension and normal cardiac output.   Laboratory Data:  High Sensitivity Troponin:   Recent Labs  Lab 03/29/24 0146 03/29/24 0319  TROPONINIHS 103* 451*     Chemistry Recent Labs  Lab 03/29/24 0146  NA 137  K 4.0  CL 105  CO2 23  GLUCOSE 172*  BUN 10  CREATININE 0.80  CALCIUM  9.2  MG 2.1  GFRNONAA >60  ANIONGAP 9    Recent Labs  Lab 03/29/24 0146  PROT 6.9  ALBUMIN 4.1  AST 24  ALT 14  ALKPHOS 37*  BILITOT 0.9   Lipids No results for input(s): "CHOL", "TRIG", "HDL", "LABVLDL", "LDLCALC", "CHOLHDL" in the last 168 hours.  Hematology Recent Labs  Lab 03/29/24 0146  WBC 7.1  RBC 3.99  HGB 13.2  HCT 38.8  MCV 97.2  MCH 33.1  MCHC 34.0  RDW 11.5  PLT 146*   Thyroid  No results for input(s): "TSH", "FREET4" in the last 168 hours.  BNP Recent Labs  Lab 03/29/24 0146  BNP 39.2    DDimer No results for input(s): "DDIMER" in the last 168 hours.  Radiology/Studies:  DG Chest Port 1 View Result Date: 03/29/2024 IMPRESSION: Stable chronic and postoperative changes without evidence of acute cardiopulmonary disease. Electronically Signed   By: Virgle Grime M.D.   On: 03/29/2024 02:06   Assessment and Plan:   Acute respiratory failure with hypoxia COPD with acute exacerbation - Presented 4/23 with  acute respiratory distress requiring CPAP transitioned to BiPAP on arrival, now on 2 L [place] - Steroids, bronchodilators, and further management per IM  Diastolic dysfunction Severe aortic stenosis s/p TAVR 12/2023 - Appears euvolemic on exam. However, given worsening shortness of breath on exertion could be a component of fluid overload.  - Trial IV Lasix  40 mg twice daily - Monitor kidney function, strict I/Os, and daily weights with ongoing diuresis  Elevated troponin  Non-obstructive CAD - LHC 09/2023 showed mild nonobstructive CAD - Mildly elevated troponin peaked at 610 - Suspect demand ischemia in the setting of respiratory failure and possible volume overload - Can continue with IV heparin  for now - No plan for further ischemic evaluation at this time - Continue  ASA and statin  Hypertension - BP overall stable, continue PTA regimen  T2DM - A1C 6.4, management per IM  For questions or updates, please contact [facility] Please consult [email or web address] for contact info under    Signed, Brodie Cannon, PA-C  03/29/2024 7:28 AM

## 2024-03-29 NOTE — Assessment & Plan Note (Signed)
 No acute issues suspected

## 2024-03-29 NOTE — Assessment & Plan Note (Addendum)
 Mild nonobstructive CAD on [facility] 09/2023 Patient complains of chest pain, elevated troponin likely secondary to demand ischemia Patient was started on heparin  infusion in the ED Will continue heparin  infusion pending cardiology consult Continue aspirin , metoprolol , rosuvastatin , lisinopril  Continue to trend troponins Cardiology consult

## 2024-03-29 NOTE — Assessment & Plan Note (Signed)
 Sliding scale insulin coverage

## 2024-03-29 NOTE — Plan of Care (Signed)
  Problem: Education: Goal: Ability to describe self-care measures that may prevent or decrease complications (Diabetes Survival Skills Education) will improve Outcome: Progressing   Problem: Coping: Goal: Ability to adjust to condition or change in health will improve Outcome: Progressing   Problem: Fluid Volume: Goal: Ability to maintain a balanced intake and output will improve Outcome: Progressing   Problem: Health Behavior/Discharge Planning: Goal: Ability to identify and utilize available resources and services will improve Outcome: Progressing   Problem: Skin Integrity: Goal: Risk for impaired skin integrity will decrease Outcome: Progressing   Problem: Tissue Perfusion: Goal: Adequacy of tissue perfusion will improve Outcome: Progressing   Problem: Education: Goal: Understanding of cardiac disease, CV risk reduction, and recovery process will improve Outcome: Progressing   Problem: Activity: Goal: Ability to tolerate increased activity will improve Outcome: Progressing   Problem: Clinical Measurements: Goal: Respiratory complications will improve Outcome: Progressing   Problem: Skin Integrity: Goal: Risk for impaired skin integrity will decrease Outcome: Progressing    Plan of care ongoing, see MAR see flowsheet

## 2024-03-29 NOTE — Evaluation (Signed)
 Physical Therapy Evaluation Patient Details Name: Amanda [name] MRN: [id number] DOB: [birth year] Today's Date: 03/29/2024  History of Present Illness  Pt is a 73 yo female that presented to the ED for SOB, admitted for COPD exacerbation and elevated troponin. PMH of DM, HTN, mild nonobstructive CAD on left heart cath 09/2023, COPD, aortic stenosis s/p TAVR  Clinical Impression  Patient alert, agreeable to PT, eating lunch. Per pt report at baseline she is ambulatory with and without rollator, uses it PRN/as needed, has used a SPC in her home as well. Recently attending cardiac rehab, interested in returning after hospital admission. She demonstrated bed mobility modI, extra time, use of bed rails. Sit <> Stand with supervision, pt declined RW. She was able to ambulate ~166ft with 2-3 standing rest breaks, intermittent use of railing in hallway, and on room air. spO2 reading >92% throughout, though pt demonstrated WOB. Returned to room with needs in reach. PT to follow acutely during hospitalization stay to help encourage mobility, discourage deconditioning from admission, and trial stair training as able, recommend returning to cardiac rehab as able.         If plan is discharge home, recommend the following: Other (comment) (NA)   Can travel by private vehicle    yes    Equipment Recommendations  NA  Recommendations for Other Services    Cardiac rehab   Functional Status Assessment Patient has not had a recent decline in their functional status (cardiovascular limiting factor for mobility)     Precautions / Restrictions Precautions Precautions: None Recall of Precautions/Restrictions: Intact Restrictions Weight Bearing Restrictions Per Provider Order: No      Mobility  Bed Mobility Overal bed mobility: Modified Independent             General bed mobility comments: effortful due to IV but no physical assistance    Transfers Overall transfer level: Needs  assistance Equipment used: Rolling walker (2 wheels) Transfers: Sit to/from Stand Sit to Stand: Supervision                Ambulation/Gait Ambulation/Gait assistance: Supervision Gait Distance (Feet): 170 Feet Assistive device: None (hallway rail occasionally)   Gait velocity: decreased     General Gait Details: several standing rest breaks, spO2 >902% throughout on room air, RN notified  Stairs            Wheelchair Mobility     Tilt Bed    Modified Rankin (Stroke Patients Only)       Balance Overall balance assessment: Needs assistance Sitting-balance support: No upper extremity supported, Feet supported Sitting balance-Leahy Scale: Normal     Standing balance support: No upper extremity supported, During functional activity Standing balance-Leahy Scale: Fair Standing balance comment: 1 lateral LOB with head turns                             Pertinent Vitals/Pain Pain Assessment Pain Assessment: No/denies pain    Home Living Family/patient expects to be discharged to:: Private residence Living Arrangements: Children Available Help at Discharge: Family;Available PRN/intermittently Type of Home: House Home Access: Stairs to enter;Ramped entrance Entrance Stairs-Rails: Can reach both Entrance Stairs-Number of Steps: 3   Home Layout: One level Home Equipment: Rollator (4 wheels)      Prior Function Prior Level of Function : Independent/Modified Independent             Mobility Comments: currently attending cardiac rehab. endorsed using her rollator as  needed in home and for community ambulation       Extremity/Trunk Assessment   Upper Extremity Assessment Upper Extremity Assessment: Generalized weakness    Lower Extremity Assessment Lower Extremity Assessment: Generalized weakness       Communication   Communication Communication: No apparent difficulties    Cognition Arousal: Alert Behavior During Therapy: WFL  for tasks assessed/performed   PT - Cognitive impairments: No apparent impairments                                 Cueing       General Comments General comments (skin integrity, edema, etc.): SpO2 97% on 2L [place] with activity    Exercises     Assessment/Plan    PT Assessment Patient does not need any further PT services  PT Problem List         PT Treatment Interventions      PT Goals (Current goals can be found in the Care Plan section)       Frequency       Co-evaluation               AM-PAC PT "6 Clicks" Mobility  Outcome Measure Help needed turning from your back to your side while in a flat bed without using bedrails?: None Help needed moving from lying on your back to sitting on the side of a flat bed without using bedrails?: None Help needed moving to and from a bed to a chair (including a wheelchair)?: None Help needed standing up from a chair using your arms (e.g., wheelchair or bedside chair)?: None Help needed to walk in hospital room?: A Little Help needed climbing 3-5 steps with a railing? : A Little 6 Click Score: 22    End of Session   Activity Tolerance: Patient tolerated treatment well Patient left: in bed;with call bell/phone within reach;with bed alarm set Nurse Communication: Mobility status;Other (comment) (oxygen status) PT Visit Diagnosis: Difficulty in walking, not elsewhere classified (R26.2)    Time: 2956-2130 PT Time Calculation (min) (ACUTE ONLY): 25 min   Charges:   PT Evaluation $PT Eval Low Complexity: 1 Low PT Treatments $Therapeutic Activity: 8-22 mins PT General Charges $$ ACUTE PT VISIT: 1 Visit         Darien Eden PT, DPT 2:47 PM,03/29/24

## 2024-03-29 NOTE — Progress Notes (Signed)
 PHARMACY - ANTICOAGULATION CONSULT NOTE  Pharmacy Consult for Heparin   Indication: chest pain/ACS  Allergies  Allergen Reactions   Honey Bee Venom [Bee Venom] Anaphylaxis   Diazepam Other (See Comments)    REACTION: pt states she gets very angry and abusive verbally on this medication    Patient Measurements: Height: 5' 7.01" (170.2 cm) Weight: 106.4 kg (234 lb 9.1 oz) IBW/kg (Calculated) : 61.62 HEPARIN  DW (KG): 85.8  Vital Signs: Temp: 98.8 F (37.1 C) (04/23 1601) BP: 140/84 (04/23 1601) Pulse Rate: 96 (04/23 1601)  Labs: Recent Labs    03/29/24 0146 03/29/24 0319 03/29/24 0859 03/29/24 1010 03/29/24 1822  HGB 13.2  --   --   --   --   HCT 38.8  --   --   --   --   PLT 146*  --   --   --   --   HEPARINUNFRC  --   --   --  0.47 0.46  CREATININE 0.80  --   --   --   --   TROPONINIHS 103* 451* 610* 514*  --    Estimated Creatinine Clearance: 78.6 mL/min (by C-G formula based on SCr of 0.8 mg/dL).  Medical History: Past Medical History:  Diagnosis Date   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 sting    Aortic stenosis    a. 2013: nl LV sys fxn, mild MR, no evidence of pulm htn; b. TTE 8/17: EF 60-65%, no RWMA, nl LV dia fxn,  mild AS, mod AI   CAD (coronary artery disease), native coronary artery 11/26/2020   Controlled type 2 diabetes mellitus with diabetic autonomic neuropathy, without long-term current use of insulin  (HCC) 08/10/2007   COPD (chronic obstructive pulmonary disease) (HCC) 10/30/2016   Depression    GAD (generalized anxiety disorder)    GERD (gastroesophageal reflux disease)    Hiatal hernia with gastroesophageal reflux 1997   Hyperlipidemia    Hypertension    Osteoporosis    S/P TAVR (transcatheter aortic valve replacement) 12/21/2023   s/p TAVR with a 34 mm Evolut FX via the TF approach by Dr. Lorie Rook & Dr. Honey Lusty   Tobacco abuse     Assessment: 72yo female with PMH significant for  DM, HTN, mild nonobstructive CAD on left heart cath 09/2023, COPD, aortic  stenosis s/p TAVR 12/2023 admitted to ED with respiratory distress. Troponin trending upward and pharmacy consulted to start and manage heparin  infusion for ACS.  No prior anticoagulation noted.   Baseline hgb and plt appropriate to start heparin  infusion. CrCl = 78.7 ml/min  Date Time Results Comments 4/23 1010 HL 0.47 Therapeutic level with heparin  rate 1200 u/h 4/23 1822 HL 0.46 Therapeutic level x 2 with heparin  rate 1200 units/hr  Goal of Therapy:  Heparin  level 0.3-0.7 units/ml Monitor platelets by anticoagulation protocol: Yes   Plan:  Heparin  level therapeutic x 2 Continue heparin  infusion at 1200 units per hour Can transition to daily heparin  levels Continue to monitor H&H and platelets  Genoveva Kidney, PharmD Clinical Pharmacist  03/29/2024 6:59 PM

## 2024-03-29 NOTE — Progress Notes (Signed)
 Pt was transported to 239 on Bipap without incident. Pt is tol Bipap well and remains on it at this time. Report given to unit RT.

## 2024-03-29 NOTE — ED Provider Notes (Signed)
 [facility] Provider Note    Event Date/Time   First MD Initiated Contact with Patient 03/29/24 0134     (approximate)   History   Respiratory distress  HPI  Level V caveat: Limited by distress  Amanda [name] is a 73 y.o. female brought to the ED via EMS from home with a chief complaint of respiratory distress.  Patient with a history of COPD who wears CPAP at night.  Reports increased shortness of breath x 1 to 2 days.  EMS administered DuoNebs, placed on CPAP for respiratory support.  Patient denies fever/chills, abdominal pain, nausea, vomiting or dizziness.  Endorses chest tightness.     Past Medical History   Past Medical History:  Diagnosis Date   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 sting    Aortic stenosis    a. 2013: nl LV sys fxn, mild MR, no evidence of pulm htn; b. TTE 8/17: EF 60-65%, no RWMA, nl LV dia fxn,  mild AS, mod AI   CAD (coronary artery disease), native coronary artery 11/26/2020   Controlled type 2 diabetes mellitus with diabetic autonomic neuropathy, without long-term current use of insulin  (HCC) 08/10/2007   COPD (chronic obstructive pulmonary disease) (HCC) 10/30/2016   Depression    GAD (generalized anxiety disorder)    GERD (gastroesophageal reflux disease)    Hiatal hernia with gastroesophageal reflux 1997   Hyperlipidemia    Hypertension    Osteoporosis    S/P TAVR (transcatheter aortic valve replacement) 12/21/2023   s/p TAVR with a 34 mm Evolut FX via the TF approach by Dr. Lorie Rook & Dr. Honey Lusty   Tobacco abuse      Active Problem List   Patient Active Problem List   Diagnosis Date Noted   RBBB 12/21/2023   S/P TAVR (transcatheter aortic valve replacement) 12/21/2023   CAD (coronary artery disease), native coronary artery 11/26/2020   Severe aortic stenosis    COPD with chronic bronchitis (HCC) 10/30/2016   Gastroesophageal reflux disease 09/17/2016   Smoker 05/10/2015   Solitary pulmonary nodule 03/28/2013   Essential  hypertension    Vitamin D  deficiency 05/29/2009   Pure hypercholesterolemia 12/07/2007   Major depressive disorder, recurrent episode, in partial remission (HCC) 12/07/2007   Controlled type 2 diabetes mellitus with diabetic autonomic neuropathy, without long-term current use of insulin  (HCC) 08/10/2007     Past Surgical History   Past Surgical History:  Procedure Laterality Date   ABDOMINAL HYSTERECTOMY  1993   ANTERIOR CERVICAL DECOMP/DISCECTOMY FUSION N/A 10/26/2018   Procedure: ACDF C4-C5 C5-C6 C6-C7;  Surgeon: Gearl Keens, MD;  Location: [facility] OR;  Service: Neurosurgery;  Laterality: N/A;   APPENDECTOMY     BACK SURGERY     BREAST BIOPSY Right 2007   benign   CHOLECYSTECTOMY     COLONOSCOPY     30 years ago was normal per pt.    DILATION AND CURETTAGE OF UTERUS     ESOPHAGEAL DILATION     x 3   ESOPHAGOGASTRODUODENOSCOPY (EGD) WITH PROPOFOL  N/A 02/06/2022   Procedure: ESOPHAGOGASTRODUODENOSCOPY (EGD) WITH PROPOFOL ;  Surgeon: Luke Salaam, MD;  Location: [facility] ENDOSCOPY;  Service: Gastroenterology;  Laterality: N/A;   ESOPHAGOGASTRODUODENOSCOPY (EGD) WITH PROPOFOL  N/A 02/26/2022   Procedure: ESOPHAGOGASTRODUODENOSCOPY (EGD) WITH PROPOFOL ;  Surgeon: Luke Salaam, MD;  Location: [facility] ENDOSCOPY;  Service: Gastroenterology;  Laterality: N/A;  EGD+dilation per Dr. Antony Baumgartner   fractured leg Right    INCISION AND DRAINAGE Right 12/02/2021   Procedure: INCISION AND DRAINAGE;  Surgeon: Molli Angelucci,  MD;  Location: [facility] ORS;  Service: Orthopedics;  Laterality: Right;   INTRAOPERATIVE TRANSTHORACIC ECHOCARDIOGRAM N/A 12/21/2023   Procedure: INTRAOPERATIVE TRANSTHORACIC ECHOCARDIOGRAM;  Surgeon: Kyra Phy, MD;  Location: [facility] INVASIVE CV LAB;  Service: Cardiovascular;  Laterality: N/A;   LEFT HEART CATH AND CORONARY ANGIOGRAPHY N/A 11/11/2020   Procedure: LEFT HEART CATH AND CORONARY ANGIOGRAPHY;  Surgeon: Wenona Hamilton, MD;  Location: [facility] INVASIVE CV LAB;  Service: Cardiovascular;  Laterality:  N/A;   OOPHORECTOMY     OVARIAN CYST REMOVAL  1972   RIGHT/LEFT HEART CATH AND CORONARY ANGIOGRAPHY Bilateral 10/04/2023   Procedure: RIGHT/LEFT HEART CATH AND CORONARY ANGIOGRAPHY;  Surgeon: Wenona Hamilton, MD;  Location: [facility] INVASIVE CV LAB;  Service: Cardiovascular;  Laterality: Bilateral;   SALIVARY GLAND SURGERY     TONSILLECTOMY     4 yoa   TOTAL KNEE ARTHROPLASTY Left 12/24/2015   Procedure: LEFT TOTAL KNEE ARTHROPLASTY;  Surgeon: Jasmine Mesi, MD;  Location: [facility] OR;  Service: Orthopedics;  Laterality: Left;   TOTAL KNEE ARTHROPLASTY Right 12/30/2017   Procedure: RIGHT TOTAL KNEE ARTHROPLASTY;  Surgeon: Jasmine Mesi, MD;  Location: [facility] OR;  Service: Orthopedics;  Laterality: Right;   WRIST FRACTURE SURGERY  11/2008     Home Medications   Prior to Admission medications   Medication Sig Start Date End Date Taking? Authorizing Provider  acetaminophen  (TYLENOL ) 500 MG tablet Take 1,000 mg by mouth every 8 (eight) hours as needed for mild pain (pain score 1-3) or moderate pain (pain score 4-6).    [provider]  albuterol  (VENTOLIN  HFA) 108 (90 Base) MCG/ACT inhaler Inhale 2 puffs into the lungs every 4 (four) hours as needed for wheezing or shortness of breath. 08/12/22   Copland, Jolena Nay, MD  amitriptyline  (ELAVIL ) 50 MG tablet Take 0.5 tablets (25 mg total) by mouth at bedtime. 09/21/23   Copland, Jolena Nay, MD  Ascorbic Acid  (VITAMIN C ) 1000 MG tablet Take 1,000 mg by mouth daily.    [provider]  aspirin  EC 81 MG tablet Take 81 mg by mouth daily.    [provider]  b complex vitamins capsule Take 1 capsule by mouth daily.    [provider]  buPROPion  (WELLBUTRIN  XL) 150 MG 24 hr tablet Take 1 tablet (150 mg total) by mouth daily. 11/01/23   Copland, Jolena Nay, MD  calcium  carbonate (TUMS - DOSED IN MG ELEMENTAL CALCIUM ) 500 MG chewable tablet Chew 2 tablets by mouth daily as needed for indigestion or heartburn.    [provider]  Cholecalciferol  (VITAMIN D ) 1000 UNITS capsule Take 2,000 Units by mouth daily.    [provider]  clonazePAM  (KLONOPIN ) 0.5 MG tablet Take 1 tablet by mouth twice daily as needed 02/04/24   Copland, Jolena Nay, MD  cyanocobalamin  (VITAMIN B12) 1000 MCG/ML injection Inject into the muscle. 02/03/24   [provider]  cyclobenzaprine  (FLEXERIL ) 10 MG tablet Take 1 tablet by mouth three times daily as needed for muscle spasm 08/27/23   Copland, Jolena Nay, MD  dimenhyDRINATE (DRAMAMINE) 50 MG tablet Take 50 mg by mouth every 8 (eight) hours as needed for nausea or dizziness.    [provider]  donepezil  (ARICEPT ) 5 MG tablet TAKE 1 TABLET BY MOUTH AT BEDTIME 01/21/24   Copland, Jolena Nay, MD  ezetimibe  (ZETIA ) 10 MG tablet Take 1 tablet by mouth once daily 03/23/24   Roark Chick, PA-C  FLUoxetine  (PROZAC ) 40 MG capsule Take 80 mg by mouth M, W, F and  40 mg by mouth Tues, Thurs, Sat and Sunday 02/28/24   Copland, Jolena Nay, MD  fluticasone  [facility]) 50 MCG/ACT nasal spray Place 2 sprays into both nostrils daily as needed for rhinitis. 01/15/23   [provider]  fluticasone -salmeterol (ADVAIR ) 250-50 MCG/ACT AEPB Inhale 1 puff into the lungs in the morning and at bedtime. 02/23/24   Copland, Jolena Nay, MD  gabapentin  (NEURONTIN ) 300 MG capsule Take 1 capsule (300 mg total) by mouth 2 (two) times daily. 01/21/24   Ardia Kraft, PA-C  guaiFENesin  (MUCINEX ) 600 MG 12 hr tablet Take 600 mg by mouth 2 (two) times daily as needed for to loosen phlegm.    [provider]  lisinopril  (ZESTRIL ) 5 MG tablet Take 1 tablet (5 mg total) by mouth daily. 01/21/24   Ardia Kraft, PA-C  metoprolol  tartrate (LOPRESSOR ) 25 MG tablet TAKE 1/2 TABLET TWICE DAILY 11/01/23   Wenona Hamilton, MD  Multiple Vitamin (MULTIVITAMIN) tablet Take 1 tablet by mouth daily.    [provider]  naproxen  sodium (ALEVE ) 220 MG tablet Take 220 mg by mouth in the morning.     [provider]  ondansetron  (ZOFRAN -ODT) 4 MG disintegrating tablet Take 1 tablet (4 mg total) by mouth every 8 (eight) hours as needed for nausea or vomiting. 12/20/21   Menshew, Raye Cai, PA-C  pantoprazole  (PROTONIX ) 40 MG tablet TAKE 1 TABLET 30 MINUTES PRIOR TO BREAKFAST 06/01/23   Copland, Jolena Nay, MD  rosuvastatin  (CRESTOR ) 40 MG tablet Take 1 tablet by mouth once daily 01/24/24   Copland, Jolena Nay, MD  Simethicone  125 MG CAPS Take 125 mg by mouth daily as needed (gas).    [provider]  vitamin E 180 MG (400 UNITS) capsule Take 400 Units by mouth daily.    [provider]     Allergies  Honey bee venom [bee venom] and Diazepam   Family History   Family History  Problem Relation Age of Onset   Hyperlipidemia Mother    Arthritis Mother    Diabetes Mother    Heart disease Mother    Colon polyps Mother    Cancer Paternal Grandmother        breast   Hypercholesterolemia Sister    Colon polyps Sister    Breast cancer Sister    Colon cancer Neg Hx    Esophageal cancer Neg Hx    Rectal cancer Neg Hx    Stomach cancer Neg Hx    Pancreatic cancer Neg Hx      Physical Exam  Triage Vital Signs: ED Triage Vitals  Encounter Vitals Group     BP      Systolic BP Percentile      Diastolic BP Percentile      Pulse      Resp      Temp      Temp src      SpO2      Weight      Height      Head Circumference      Peak Flow      Pain Score      Pain Loc      Pain Education      Exclude from Growth Chart     Updated Vital Signs: BP (!) 159/80 (BP Location: Left Arm)   Pulse (!) 101   SpO2 98%    General: Awake, moderate distress.  CV:  Tachycardic.  Good peripheral perfusion.  Resp:  Increased effort.  Decreased aeration. Abd:  Nontender.  No distention.  Other:  Bilateral calves are supple without tenderness.  No pedal edema.   ED Results / Procedures / Treatments  Labs (all labs ordered are listed, but only abnormal results are  displayed) Labs Reviewed  CBC WITH DIFFERENTIAL/PLATELET - Abnormal; Notable for the following components:      Result Value   Platelets 146 (*)    All other components within normal limits  COMPREHENSIVE METABOLIC PANEL WITH GFR - Abnormal; Notable for the following components:   Glucose, Bld 172 (*)    Alkaline Phosphatase 37 (*)    All other components within normal limits  TROPONIN I (HIGH SENSITIVITY) - Abnormal; Notable for the following components:   Troponin I (High Sensitivity) 103 (*)    All other components within normal limits  CULTURE, BLOOD (ROUTINE X 2)  CULTURE, BLOOD (ROUTINE X 2)  RESP PANEL BY RT-PCR (RSV, FLU A&B, COVID)  RVPGX2  LACTIC ACID, PLASMA  MAGNESIUM   BLOOD GAS, ARTERIAL  BRAIN NATRIURETIC PEPTIDE  LACTIC ACID, PLASMA     EKG  ED ECG REPORT I, Tekelia Kareem J, the attending physician, personally viewed and interpreted this ECG.   Date: 03/29/2024  EKG Time: 0137  Rate: 106  Rhythm: sinus tachycardia  Axis: Normal  Intervals:right bundle branch block  ST&T Change: Nonspecific  ED ECG REPORT I, Joshva Labreck J, the attending physician, personally viewed and interpreted this ECG.   Date: 03/29/2024  EKG Time: 0236  Rate: 98  Rhythm: normal sinus rhythm  Axis: Normal  Intervals:none  ST&T Change: Nonspecific     RADIOLOGY I have independently visualized and interpreted patient's imaging study as well as noted the radiology interpretation:  Chest x-ray: No acute cardiopulmonary process  Official radiology report(s): DG Chest Port 1 View Result Date: 03/29/2024 CLINICAL DATA:  Shortness of breath. EXAM: PORTABLE CHEST 1 VIEW COMPARISON:  December 17, 2023 FINDINGS: The heart size and mediastinal contours are within normal limits. There is marked severity calcification of the thoracic aorta. An artificial aortic valve is also seen. The lungs are hyperinflated with stable elevation of the posterior aspect of the mid left hemithorax. Mild,  diffuse, chronic appearing increased interstitial lung markings are noted. There is no evidence of acute infiltrate, pleural effusion or pneumothorax. Postoperative changes are seen within the lower cervical spine. Multilevel degenerative changes are present throughout the thoracic spine. IMPRESSION: Stable chronic and postoperative changes without evidence of acute cardiopulmonary disease. Electronically Signed   By: Virgle Grime M.D.   On: 03/29/2024 02:06     PROCEDURES:  Critical Care performed: Yes, see critical care procedure note(s) CRITICAL CARE Performed by: Norlene Beavers   Total critical care time: 45 minutes  Critical care time was exclusive of separately billable procedures and treating other patients.  Critical care was necessary to treat or prevent imminent or life-threatening deterioration.  Critical care was time spent personally by me on the following activities: development of treatment plan with patient and/or surrogate as well as nursing, discussions with consultants, evaluation of patient's response to treatment, examination of patient, obtaining history from patient or surrogate, ordering and performing treatments and interventions, ordering and review of laboratory studies, ordering and review of radiographic studies, pulse oximetry and re-evaluation of patient's condition.  Aaron Aas1-3 Lead EKG Interpretation  Performed by: Norlene Beavers, MD Authorized by: Norlene Beavers, MD     Interpretation: abnormal     ECG rate:  105   ECG rate assessment: tachycardic     Rhythm: sinus  tachycardia     Ectopy: none     Conduction: normal   Comments:     Patient placed on cardiac monitor to evaluate for arrhythmias    MEDICATIONS ORDERED IN ED: Medications  magnesium  sulfate IVPB 2 g 50 mL (2 g Intravenous New Bag/Given 03/29/24 0213)  heparin  ADULT infusion 100 units/mL (25000 units/250mL) (has no administration in time range)  heparin  injection 4,000 Units (has no  administration in time range)  methylPREDNISolone  sodium succinate (SOLU-MEDROL ) 125 mg/2 mL injection 125 mg (125 mg Intravenous Given 03/29/24 0203)     IMPRESSION / MDM / ASSESSMENT AND PLAN / ED COURSE  I reviewed the triage vital signs and the nursing notes.                             73 year old female presenting with shortness of breath. Differential includes, but is not limited to, viral syndrome, bronchitis including COPD exacerbation, pneumonia, reactive airway disease including asthma, CHF including exacerbation with or without pulmonary/interstitial edema, pneumothorax, ACS, thoracic trauma, and pulmonary embolism.  I personally reviewed patient's records and note a neurology office visit from 02/29/2024 for imbalance.  I have also noted that patient is status post TAVR.  Patient's presentation is most consistent with acute presentation with potential threat to life or bodily function.  The patient is on the cardiac monitor to evaluate for evidence of arrhythmia and/or significant heart rate changes.  Patient transition to BiPAP immediately upon her arrival to the treatment room.  Will add Solu-Medrol , 2 g IV magnesium .  Obtain cardiac panel, chest x-ray.  Anticipate hospitalization.  Clinical Course as of 03/29/24 0250  Wed Mar 29, 2024  0247 Laboratory results demonstrate normal WBC 7.1, unremarkable electrolytes, negative lactic acid.  ABG unremarkable on BiPAP.  Elevated initial troponin of 103.  Chest x-ray negative for acute process.  Will initiate heparin  drip and consult hospitalist services for evaluation and admission. [JS]    Clinical Course User Index [JS] Norlene Beavers, MD     FINAL CLINICAL IMPRESSION(S) / ED DIAGNOSES   Final diagnoses:  Respiratory distress  COPD exacerbation (HCC)  NSTEMI (non-ST elevated myocardial infarction) [facility])     Rx / DC Orders   ED Discharge Orders     None        Note:  This document was prepared using Dragon voice  recognition software and may include unintentional dictation errors.   Shiza Thelen J, MD 03/29/24 [phone]

## 2024-03-29 NOTE — Progress Notes (Signed)
 Nutrition Brief Note  RD consulted for assessment of nutritional requirements/ status secondary to COPD.   Wt Readings from Last 15 Encounters:  03/29/24 106.4 kg  02/27/24 106.6 kg  02/21/24 107.1 kg  02/21/24 106.6 kg  01/21/24 104.8 kg  12/31/23 105.1 kg  12/22/23 102.2 kg  12/09/23 101.6 kg  10/20/23 101.8 kg  10/14/23 102.3 kg  10/04/23 104.8 kg  09/22/23 105.4 kg  08/26/23 104.8 kg  02/10/23 102.3 kg  01/28/23 101.8 kg   Pt with medical history significant for DM, HTN, mild nonobstructive CAD on left heart cath 09/2023, COPD, aortic stenosis s/p TAVR 12/2023 being admitted with COPD exacerbation and elevated troponin.  Pt admitted with COPD.   Reviewed I/O's: +109 ml x 24 hours  Spoke with pt at bedside, who was pleasant and in good spirits today. Pt reports feeling better since being taken off Bi-pap due to discomfort from mask placement secondary to nasal fractures. Pt reports feeling hungry and happy to receive her breakfast and cup of coffee.   Pt shares that she typically has a good appetite, consuming 2-3 meals per day. Pt reports decreased oral intake 2 days PTA due to respiratory difficulties, however, has improved since admission. Pt shares that she has had significant stress in her life, including death of her husband in November 19, 2024and dealing with her own recent health issues. Emotional support provided.   Wt stable over the past 3 months. Pt thinks she has gained some weight, as she has had to reduce her activity level secondary to recent vascular surgery and admits to additional snacking. She started participating in cardiac rehab, which she finds helpful.   Nutrition-Focused physical exam completed. Findings are no fat depletion, no muscle depletion, and no edema.    Medications reviewed and include neurontin , solu-medrol , prednisone , and crestor .   Lab Results  Component Value Date   HGBA1C 6.4 01/28/2023   PTA DM medications are none.   Labs reviewed:  CBGS: 227 (inpatient orders for glycemic control are 0-15 units insulin  aspart TID with meals and 0-5 units insulin  aspart daily at bedtime).    Body mass index is 36.73 kg/m. Patient meets criteria for obesity, class II based on current BMI. Obesity is a complex, chronic medical condition that is optimally managed by a multidisciplinary care team. Weight loss is not an ideal goal for an acute inpatient hospitalization. However, if further work-up for obesity is warranted, consider outpatient referral to 's Nutrition and Diabetes Education Services.    Current diet order is heart healthy, carb modified (liberalized to carb modified), patient is consuming approximately 100% of meals at this time. Labs and medications reviewed.   No nutrition interventions warranted at this time. If nutrition issues arise, please consult RD.   Herschel Lords, RD, LDN, CDCES Registered Dietitian III Certified Diabetes Care and Education Specialist If unable to reach this RD, please use "RD Inpatient" group chat on secure chat between hours of 8am-4 pm daily

## 2024-03-29 NOTE — ED Notes (Signed)
 ED TO INPATIENT HANDOFF REPORT  ED Nurse Name and Phone #: [phone]  S Name/Age/Gender Amanda [name] 72 y.o. female Room/Bed: ED19A/ED19A  Code Status   Code Status: Full Code  Home/SNF/Other Home Patient oriented to: self, place, time, and situation Is this baseline? Yes   Triage Complete: Triage complete  Chief Complaint Elevated troponin [R79.89]  Triage Note Pt arrived via EMS from home for SOB. Pt does have a hx of COPD. Pt took her nebulizer treatment but states it did not help. Pt arrived on CPAP sating at 97%.    Allergies Allergies  Allergen Reactions   Honey Bee Venom [Bee Venom] Anaphylaxis   Diazepam Other (See Comments)    REACTION: pt states she gets very angry and abusive verbally on this medication     Level of Care/Admitting Diagnosis ED Disposition     ED Disposition  Admit   Condition  --   Comment  Hospital Area: [facility] [[id number]]  Level of Care: Progressive [102]  Admit to Progressive based on following criteria: CARDIOVASCULAR & THORACIC of moderate stability with acute coronary syndrome symptoms/low risk myocardial infarction/hypertensive urgency/arrhythmias/heart failure potentially compromising stability and stable post cardiovascular intervention patients.  Admit to Progressive based on following criteria: RESPIRATORY PROBLEMS hypoxemic/hypercapnic respiratory failure that is responsive to NIPPV (BiPAP) or High Flow Nasal Cannula (6-80 lpm). Frequent assessment/intervention, no > Q2 hrs < Q4 hrs, to maintain oxygenation and pulmonary hygiene.  Covid Evaluation: Asymptomatic - no recent exposure (last 10 days) testing not required  Diagnosis: Elevated troponin [321909]  Admitting Physician: Lanetta Pion [[id number]]  Attending Physician: Lanetta Pion [[id number]]          B Medical/Surgery History Past Medical History:  Diagnosis Date   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 sting    Aortic stenosis    a. 2013: nl LV sys fxn, mild  MR, no evidence of pulm htn; b. TTE 8/17: EF 60-65%, no RWMA, nl LV dia fxn,  mild AS, mod AI   CAD (coronary artery disease), native coronary artery 11/26/2020   Controlled type 2 diabetes mellitus with diabetic autonomic neuropathy, without long-term current use of insulin  (HCC) 08/10/2007   COPD (chronic obstructive pulmonary disease) (HCC) 10/30/2016   Depression    GAD (generalized anxiety disorder)    GERD (gastroesophageal reflux disease)    Hiatal hernia with gastroesophageal reflux 1997   Hyperlipidemia    Hypertension    Osteoporosis    S/P TAVR (transcatheter aortic valve replacement) 12/21/2023   s/p TAVR with a 34 mm Evolut FX via the TF approach by Dr. Lorie Rook & Dr. Honey Lusty   Tobacco abuse    Past Surgical History:  Procedure Laterality Date   ABDOMINAL HYSTERECTOMY  1993   ANTERIOR CERVICAL DECOMP/DISCECTOMY FUSION N/A 10/26/2018   Procedure: ACDF C4-C5 C5-C6 C6-C7;  Surgeon: Gearl Keens, MD;  Location: [facility] OR;  Service: Neurosurgery;  Laterality: N/A;   APPENDECTOMY     BACK SURGERY     BREAST BIOPSY Right 2007   benign   CHOLECYSTECTOMY     COLONOSCOPY     30 years ago was normal per pt.    DILATION AND CURETTAGE OF UTERUS     ESOPHAGEAL DILATION     x 3   ESOPHAGOGASTRODUODENOSCOPY (EGD) WITH PROPOFOL  N/A 02/06/2022   Procedure: ESOPHAGOGASTRODUODENOSCOPY (EGD) WITH PROPOFOL ;  Surgeon: Luke Salaam, MD;  Location: [facility] ENDOSCOPY;  Service: Gastroenterology;  Laterality: N/A;   ESOPHAGOGASTRODUODENOSCOPY (EGD) WITH PROPOFOL  N/A 02/26/2022   Procedure: ESOPHAGOGASTRODUODENOSCOPY (EGD)  WITH PROPOFOL ;  Surgeon: Luke Salaam, MD;  Location: [facility] ENDOSCOPY;  Service: Gastroenterology;  Laterality: N/A;  EGD+dilation per Dr. Antony Baumgartner   fractured leg Right    INCISION AND DRAINAGE Right 12/02/2021   Procedure: INCISION AND DRAINAGE;  Surgeon: Molli Angelucci, MD;  Location: [facility] ORS;  Service: Orthopedics;  Laterality: Right;   INTRAOPERATIVE TRANSTHORACIC ECHOCARDIOGRAM N/A  12/21/2023   Procedure: INTRAOPERATIVE TRANSTHORACIC ECHOCARDIOGRAM;  Surgeon: Kyra Phy, MD;  Location: [facility] INVASIVE CV LAB;  Service: Cardiovascular;  Laterality: N/A;   LEFT HEART CATH AND CORONARY ANGIOGRAPHY N/A 11/11/2020   Procedure: LEFT HEART CATH AND CORONARY ANGIOGRAPHY;  Surgeon: Wenona Hamilton, MD;  Location: [facility] INVASIVE CV LAB;  Service: Cardiovascular;  Laterality: N/A;   OOPHORECTOMY     OVARIAN CYST REMOVAL  1972   RIGHT/LEFT HEART CATH AND CORONARY ANGIOGRAPHY Bilateral 10/04/2023   Procedure: RIGHT/LEFT HEART CATH AND CORONARY ANGIOGRAPHY;  Surgeon: Wenona Hamilton, MD;  Location: [facility] INVASIVE CV LAB;  Service: Cardiovascular;  Laterality: Bilateral;   SALIVARY GLAND SURGERY     TONSILLECTOMY     4 yoa   TOTAL KNEE ARTHROPLASTY Left 12/24/2015   Procedure: LEFT TOTAL KNEE ARTHROPLASTY;  Surgeon: Jasmine Mesi, MD;  Location: [facility] OR;  Service: Orthopedics;  Laterality: Left;   TOTAL KNEE ARTHROPLASTY Right 12/30/2017   Procedure: RIGHT TOTAL KNEE ARTHROPLASTY;  Surgeon: Jasmine Mesi, MD;  Location: [facility] OR;  Service: Orthopedics;  Laterality: Right;   WRIST FRACTURE SURGERY  11/2008     A IV Location/Drains/Wounds Patient Lines/Drains/Airways Status     Active Line/Drains/Airways     Name Placement date Placement time Site Days   Peripheral IV 03/29/24 20 G Right Antecubital 03/29/24  0203  Antecubital  less than 1   Peripheral IV 03/29/24 22 G Left;Posterior Hand 03/29/24  0343  Hand  less than 1            Intake/Output Last 24 hours No intake or output data in the 24 hours ending 03/29/24 0445  Labs/Imaging Results for orders placed or performed during the hospital encounter of 03/29/24 (from the past 48 hours)  CBC with Differential     Status: Abnormal   Collection Time: 03/29/24  1:46 AM  Result Value Ref Range   WBC 7.1 4.0 - 10.5 K/uL   RBC 3.99 3.87 - 5.11 MIL/uL   Hemoglobin 13.2 12.0 - 15.0 g/dL   HCT 16.1 09.6 - 04.5 %    MCV 97.2 80.0 - 100.0 fL   MCH 33.1 26.0 - 34.0 pg   MCHC 34.0 30.0 - 36.0 g/dL   RDW 40.9 81.1 - 91.4 %   Platelets 146 (L) 150 - 400 K/uL   nRBC 0.0 0.0 - 0.2 %   Neutrophils Relative % 72 %   Neutro Abs 5.1 1.7 - 7.7 K/uL   Lymphocytes Relative 12 %   Lymphs Abs 0.9 0.7 - 4.0 K/uL   Monocytes Relative 8 %   Monocytes Absolute 0.6 0.1 - 1.0 K/uL   Eosinophils Relative 7 %   Eosinophils Absolute 0.5 0.0 - 0.5 K/uL   Basophils Relative 1 %   Basophils Absolute 0.1 0.0 - 0.1 K/uL   Immature Granulocytes 0 %   Abs Immature Granulocytes 0.03 0.00 - 0.07 K/uL    Comment: Performed at [facility], [address] [place], [place] [address]  Comprehensive metabolic panel     Status: Abnormal   Collection Time: 03/29/24  1:46 AM  Result Value  Ref Range   Sodium 137 135 - 145 mmol/L   Potassium 4.0 3.5 - 5.1 mmol/L   Chloride 105 98 - 111 mmol/L   CO2 23 22 - 32 mmol/L   Glucose, Bld 172 (H) 70 - 99 mg/dL    Comment: Glucose reference range applies only to samples taken after fasting for at least 8 hours.   BUN 10 8 - 23 mg/dL   Creatinine, Ser 7.42 0.44 - 1.00 mg/dL   Calcium  9.2 8.9 - 10.3 mg/dL   Total Protein 6.9 6.5 - 8.1 g/dL   Albumin 4.1 3.5 - 5.0 g/dL   AST 24 15 - 41 U/L   ALT 14 0 - 44 U/L   Alkaline Phosphatase 37 (L) 38 - 126 U/L   Total Bilirubin 0.9 0.0 - 1.2 mg/dL   GFR, Estimated >59 >56 mL/min    Comment: (NOTE) Calculated using the CKD-EPI Creatinine Equation (2021)    Anion gap 9 5 - 15    Comment: Performed at [facility], [address] [place], [place] [address]  Troponin I (High Sensitivity)     Status: Abnormal   Collection Time: 03/29/24  1:46 AM  Result Value Ref Range   Troponin I (High Sensitivity) 103 (HH) <18 ng/L    Comment: CRITICAL RESULT CALLED TO, READ BACK BY AND VERIFIED WITH  Jakyra Kenealy AT 0244 03/29/24 JG (NOTE) Elevated high sensitivity troponin I (hsTnI) values and significant  changes across serial  measurements may suggest ACS but many other  chronic and acute conditions are known to elevate hsTnI results.  Refer to the "Links" section for chest pain algorithms and additional  guidance. Performed at [facility], [address] [place], [place] [address]   Lactic acid, plasma     Status: None   Collection Time: 03/29/24  1:46 AM  Result Value Ref Range   Lactic Acid, Venous 1.1 0.5 - 1.9 mmol/L    Comment: Performed at [facility], [address] [place], [place] [address]  Resp panel by RT-PCR (RSV, Flu A&B, Covid) Anterior Nasal Swab     Status: None   Collection Time: 03/29/24  1:46 AM   Specimen: Anterior Nasal Swab  Result Value Ref Range   SARS Coronavirus 2 by RT PCR NEGATIVE NEGATIVE    Comment: (NOTE) SARS-CoV-2 target nucleic acids are NOT DETECTED.  The SARS-CoV-2 RNA is generally detectable in upper respiratory specimens during the acute phase of infection. The lowest concentration of SARS-CoV-2 viral copies this assay can detect is 138 copies/mL. A negative result does not preclude SARS-Cov-2 infection and should not be used as the sole basis for treatment or other patient management decisions. A negative result may occur with  improper specimen collection/handling, submission of specimen other than nasopharyngeal swab, presence of viral mutation(s) within the areas targeted by this assay, and inadequate number of viral copies(<138 copies/mL). A negative result must be combined with clinical observations, patient history, and epidemiological information. The expected result is Negative.  Fact Sheet for Patients:  BloggerCourse.com  Fact Sheet for Healthcare Providers:  SeriousBroker.it  This test is no t yet approved or cleared by the [place]  FDA and  has been authorized for detection and/or diagnosis of SARS-CoV-2 by FDA under an Emergency Use Authorization (EUA). This EUA will  remain  in effect (meaning this test can be used) for the duration of the COVID-19 declaration under Section 564(b)(1) of the Act, 21 U.S.C.section 360bbb-3(b)(1), unless the authorization is terminated  or revoked sooner.       Influenza A by PCR NEGATIVE NEGATIVE   Influenza B by PCR NEGATIVE NEGATIVE    Comment: (NOTE) The Xpert Xpress SARS-CoV-2/FLU/RSV plus assay is intended as an aid in the diagnosis of influenza from Nasopharyngeal swab specimens and should not be used as a sole basis for treatment. Nasal washings and aspirates are unacceptable for Xpert Xpress SARS-CoV-2/FLU/RSV testing.  Fact Sheet for Patients: BloggerCourse.com  Fact Sheet for Healthcare Providers: SeriousBroker.it  This test is not yet approved or cleared by the [place]  FDA and has been authorized for detection and/or diagnosis of SARS-CoV-2 by FDA under an Emergency Use Authorization (EUA). This EUA will remain in effect (meaning this test can be used) for the duration of the COVID-19 declaration under Section 564(b)(1) of the Act, 21 U.S.C. section 360bbb-3(b)(1), unless the authorization is terminated or revoked.     Resp Syncytial Virus by PCR NEGATIVE NEGATIVE    Comment: (NOTE) Fact Sheet for Patients: BloggerCourse.com  Fact Sheet for Healthcare Providers: SeriousBroker.it  This test is not yet approved or cleared by the [place]  FDA and has been authorized for detection and/or diagnosis of SARS-CoV-2 by FDA under an Emergency Use Authorization (EUA). This EUA will remain in effect (meaning this test can be used) for the duration of the COVID-19 declaration under Section 564(b)(1) of the Act, 21 U.S.C. section 360bbb-3(b)(1), unless the authorization is terminated or revoked.  Performed at [facility], [address] [place], [place] [address]   Magnesium       Status: None   Collection Time: 03/29/24  1:46 AM  Result Value Ref Range   Magnesium  2.1 1.7 - 2.4 mg/dL    Comment: Performed at [facility], [address] [place], [place] [address]  Blood gas, arterial     Status: None   Collection Time: 03/29/24  1:50 AM  Result Value Ref Range   pH, Arterial 7.37 7.35 - 7.45   pCO2 arterial 46 32 - 48 mmHg   pO2, Arterial 102 83 - 108 mmHg   Bicarbonate 26.6 20.0 - 28.0 mmol/L   Acid-Base Excess 0.8 0.0 - 2.0 mmol/L   O2 Saturation 99.1 %   Patient temperature 37.0    Collection site RIGHT RADIAL    Allens test (pass/fail) PASS PASS    Comment: Performed at [facility], [address] [place], [place] [address]  Lactic acid, plasma     Status: None   Collection Time: 03/29/24  3:19 AM  Result Value Ref Range   Lactic Acid, Venous 1.0 0.5 - 1.9 mmol/L    Comment: Performed at [facility], [address] [place], [place] [address]  Troponin I (High Sensitivity)     Status: Abnormal   Collection Time: 03/29/24  3:19 AM  Result Value Ref Range   Troponin I (High Sensitivity) 451 (HH) <18 ng/L    Comment: CRITICAL VALUE NOTED. VALUE IS CONSISTENT WITH PREVIOUSLY REPORTED/CALLED VALUE JG (NOTE) Elevated high sensitivity troponin I (hsTnI) values and significant  changes across serial measurements may suggest ACS but many other  chronic and acute conditions are known to elevate hsTnI results.  Refer to the "Links" section for chest pain algorithms and additional  guidance. Performed at [facility], [address], [place], [place] [address]    DG Chest [place] 1 View Result Date: 03/29/2024 CLINICAL DATA:  Shortness of breath. EXAM: PORTABLE CHEST 1 VIEW COMPARISON:  December 17, 2023 FINDINGS: The  heart size and mediastinal contours are within normal limits. There is marked severity calcification of the thoracic aorta. An artificial aortic valve is also seen. The lungs are hyperinflated with stable  elevation of the posterior aspect of the mid left hemithorax. Mild, diffuse, chronic appearing increased interstitial lung markings are noted. There is no evidence of acute infiltrate, pleural effusion or pneumothorax. Postoperative changes are seen within the lower cervical spine. Multilevel degenerative changes are present throughout the thoracic spine. IMPRESSION: Stable chronic and postoperative changes without evidence of acute cardiopulmonary disease. Electronically Signed   By: Virgle Grime M.D.   On: 03/29/2024 02:06    Pending Labs Unresulted Labs (From admission, onward)     Start     Ordered   03/29/24 1100  Heparin  level (unfractionated)  Once-Timed,   TIMED        03/29/24 0338   03/29/24 0500  Lipoprotein A (LPA)  Tomorrow morning,   R        03/29/24 0331   03/29/24 0329  Hemoglobin A1c  Once,   R       Comments: To assess prior glycemic control    03/29/24 0331   03/29/24 0329  HIV Antibody (routine testing w rflx)  (HIV Antibody (Routine testing w reflex) panel)  Once,   R        03/29/24 0331   03/29/24 0135  Brain natriuretic peptide  Once,   URGENT        03/29/24 0135   03/29/24 0135  Culture, blood (routine x 2)  BLOOD CULTURE X 2,   STAT      03/29/24 0135            Vitals/Pain Today's Vitals   03/29/24 0213 03/29/24 0304 03/29/24 0305 03/29/24 0408  BP: (!) 159/80  (!) 169/77   Pulse:   97   Resp:   19   Temp:   97.8 F (36.6 C)   TempSrc:   Oral   SpO2:   98%   Weight:  106.6 kg    Height:  5\' 7"  (1.702 m)    PainSc:   0-No pain 5     Isolation Precautions No active isolations  Medications Medications  heparin  ADULT infusion 100 units/mL (25000 units/250mL) (1,200 Units/hr Intravenous New Bag/Given 03/29/24 0325)  aspirin  EC tablet 81 mg (has no administration in time range)  ezetimibe  (ZETIA ) tablet 10 mg (has no administration in time range)  lisinopril  (ZESTRIL ) tablet 5 mg (has no administration in time range)  metoprolol  tartrate  (LOPRESSOR ) tablet 25 mg (has no administration in time range)  rosuvastatin  (CRESTOR ) tablet 40 mg (has no administration in time range)  buPROPion  (WELLBUTRIN  XL) 24 hr tablet 150 mg (has no administration in time range)  donepezil  (ARICEPT ) tablet 5 mg (has no administration in time range)  FLUoxetine  (PROZAC ) capsule 40 mg (has no administration in time range)  clonazePAM  (KLONOPIN ) tablet 0.5 mg (0.5 mg Oral Given 03/29/24 0408)  gabapentin  (NEURONTIN ) capsule 300 mg (has no administration in time range)  guaiFENesin  (MUCINEX ) 12 hr tablet 600 mg (has no administration in time range)  nitroGLYCERIN  (NITROSTAT ) SL tablet 0.4 mg (has no administration in time range)  acetaminophen  (TYLENOL ) tablet 650 mg (650 mg Oral Given 03/29/24 0408)  ondansetron  (ZOFRAN ) injection 4 mg (has no administration in time range)  insulin  aspart (novoLOG ) injection 0-15 Units (has no administration in time range)  insulin  aspart (novoLOG ) injection 0-5 Units (has no administration in time range)  methylPREDNISolone   sodium succinate (SOLU-MEDROL ) 40 mg/mL injection 40 mg (has no administration in time range)    Followed by  predniSONE  (DELTASONE ) tablet 40 mg (has no administration in time range)  ipratropium-albuterol  (DUONEB) 0.5-2.5 (3) MG/3ML nebulizer solution 3 mL (3 mLs Nebulization Given 03/29/24 0346)  albuterol  (PROVENTIL ) (2.5 MG/3ML) 0.083% nebulizer solution 2.5 mg (has no administration in time range)  methylPREDNISolone  sodium succinate (SOLU-MEDROL ) 125 mg/2 mL injection 125 mg (125 mg Intravenous Given 03/29/24 0203)  magnesium  sulfate IVPB 2 g 50 mL (0 g Intravenous Stopped 03/29/24 0318)  heparin  bolus via infusion 4,000 Units (4,000 Units Intravenous Bolus from Bag 03/29/24 0325)    Mobility walks with device     Focused Assessments Pulmonary Assessment Handoff:  Lung sounds:   O2 Device: Bi-PAP      R Recommendations: See Admitting Provider Note  Report given to:   Additional  Notes:

## 2024-03-30 ENCOUNTER — Other Ambulatory Visit (HOSPITAL_COMMUNITY): Payer: Self-pay

## 2024-03-30 ENCOUNTER — Encounter

## 2024-03-30 ENCOUNTER — Telehealth (HOSPITAL_COMMUNITY): Payer: Self-pay

## 2024-03-30 ENCOUNTER — Other Ambulatory Visit: Payer: Self-pay

## 2024-03-30 DIAGNOSIS — J441 Chronic obstructive pulmonary disease with (acute) exacerbation: Secondary | ICD-10-CM | POA: Diagnosis not present

## 2024-03-30 LAB — HEPARIN LEVEL (UNFRACTIONATED): Heparin Unfractionated: 0.57 [IU]/mL (ref 0.30–0.70)

## 2024-03-30 LAB — BASIC METABOLIC PANEL WITH GFR
Anion gap: 9 (ref 5–15)
BUN: 20 mg/dL (ref 8–23)
CO2: 24 mmol/L (ref 22–32)
Calcium: 9.3 mg/dL (ref 8.9–10.3)
Chloride: 101 mmol/L (ref 98–111)
Creatinine, Ser: 0.95 mg/dL (ref 0.44–1.00)
GFR, Estimated: >60 mL/min (ref >60)
Glucose, Bld: 227 mg/dL — ABNORMAL HIGH (ref 70–99)
Potassium: 3.9 mmol/L (ref 3.5–5.1)
Sodium: 134 mmol/L — ABNORMAL LOW (ref 135–145)

## 2024-03-30 LAB — CBC
HCT: 35.3 % — ABNORMAL LOW (ref 36.0–46.0)
Hemoglobin: 12 g/dL (ref 12.0–15.0)
MCH: 33.5 pg (ref 26.0–34.0)
MCHC: 34 g/dL (ref 30.0–36.0)
MCV: 98.6 fL (ref 80.0–100.0)
Platelets: 179 10*3/uL (ref 150–400)
RBC: 3.58 MIL/uL — ABNORMAL LOW (ref 3.87–5.11)
RDW: 11.6 % (ref 11.5–15.5)
WBC: 11.6 10*3/uL — ABNORMAL HIGH (ref 4.0–10.5)
nRBC: 0 % (ref 0.0–0.2)

## 2024-03-30 LAB — LIPOPROTEIN A (LPA): Lipoprotein (a): <8.4 nmol/L (ref <75.0)

## 2024-03-30 LAB — GLUCOSE, CAPILLARY
Glucose-Capillary: 254 mg/dL — ABNORMAL HIGH (ref 70–99)
Glucose-Capillary: 183 mg/dL — ABNORMAL HIGH (ref 70–99)
Glucose-Capillary: 204 mg/dL — ABNORMAL HIGH (ref 70–99)
Glucose-Capillary: 276 mg/dL — ABNORMAL HIGH (ref 70–99)

## 2024-03-30 LAB — MAGNESIUM: Magnesium: 2.3 mg/dL (ref 1.7–2.4)

## 2024-03-30 LAB — PHOSPHORUS: Phosphorus: 3.8 mg/dL (ref 2.5–4.6)

## 2024-03-30 MED ORDER — INSULIN ASPART 100 UNIT/ML IJ SOLN
3.0000 [IU] | Freq: Three times a day (TID) | INTRAMUSCULAR | Status: DC
  Administered 2024-03-30 – 2024-03-31 (×4): 3 [IU] via SUBCUTANEOUS
  Filled 2024-03-30 (×4): qty 1

## 2024-03-30 MED ORDER — SPIRONOLACTONE 25 MG PO TABS
25.0000 mg | ORAL_TABLET | Freq: Every day | ORAL | Status: DC
  Administered 2024-03-30 – 2024-03-31 (×2): 25 mg via ORAL
  Filled 2024-03-30 (×2): qty 1

## 2024-03-30 MED ORDER — INSULIN GLARGINE-YFGN 100 UNIT/ML ~~LOC~~ SOLN
10.0000 [IU] | Freq: Every day | SUBCUTANEOUS | Status: DC
  Administered 2024-03-30 – 2024-03-31 (×2): 10 [IU] via SUBCUTANEOUS
  Filled 2024-03-30 (×2): qty 0.1

## 2024-03-30 MED ORDER — GUAIFENESIN-DM 100-10 MG/5ML PO SYRP
10.0000 mL | ORAL_SOLUTION | Freq: Four times a day (QID) | ORAL | Status: DC | PRN
Start: 2024-03-30 — End: 2024-03-31
  Administered 2024-03-30: 10 mL via ORAL
  Filled 2024-03-30 (×2): qty 10

## 2024-03-30 MED ORDER — ENOXAPARIN SODIUM 60 MG/0.6ML IJ SOSY
0.5000 mg/kg | PREFILLED_SYRINGE | INTRAMUSCULAR | Status: DC
  Administered 2024-03-31: 52.5 mg via SUBCUTANEOUS
  Filled 2024-03-30: qty 0.6

## 2024-03-30 MED ORDER — EMPAGLIFLOZIN 10 MG PO TABS
10.0000 mg | ORAL_TABLET | Freq: Every day | ORAL | Status: DC
  Administered 2024-03-30 – 2024-03-31 (×2): 10 mg via ORAL
  Filled 2024-03-30 (×2): qty 1

## 2024-03-30 NOTE — Plan of Care (Signed)
  Problem: Education: Goal: Knowledge of General Education information will improve Description: Including pain rating scale, medication(s)/side effects and non-pharmacologic comfort measures Outcome: Progressing   Problem: Clinical Measurements: Goal: Respiratory complications will improve Outcome: Progressing   Problem: Clinical Measurements: Goal: Cardiovascular complication will be avoided Outcome: Progressing   Problem: Activity: Goal: Risk for activity intolerance will decrease Outcome: Progressing   Problem: Elimination: Goal: Will not experience complications related to bowel motility Outcome: Progressing   Problem: Elimination: Goal: Will not experience complications related to urinary retention Outcome: Progressing   Problem: Pain Managment: Goal: General experience of comfort will improve and/or be controlled Outcome: Progressing

## 2024-03-30 NOTE — Telephone Encounter (Signed)
 Advanced Heart Failure Patient Advocate Encounter  The patient was approved for a [employer] grant that will help cover the cost of Jardiance .  Total amount awarded, $10,000.  Effective: 02/29/2024 - 02/27/2025.  BIN [id number] PCN [id number] Group [id number] ID [id number]  Pharmacy provided with approval and processing information. Patient informed while admitted.  Kennis Peacock, CPhT Rx Patient Advocate Phone: [phone]

## 2024-03-30 NOTE — Inpatient Diabetes Management (Addendum)
 Inpatient Diabetes Program Recommendations  AACE/ADA: New Consensus Statement on Inpatient Glycemic Control (2015)  Target Ranges:  Prepandial:   less than 140 mg/dL      Peak postprandial:   less than 180 mg/dL (1-2 hours)      Critically ill patients:  140 - 180 mg/dL   Lab Results  Component Value Date   GLUCAP 254 (H) 03/30/2024   HGBA1C 6.9 (H) 03/29/2024    Review of Glycemic Control  Latest Reference Range & Units 03/29/24 07:31 03/29/24 12:51 03/29/24 15:58 03/29/24 20:39 03/30/24 08:50  Glucose-Capillary 70 - 99 mg/dL 086 (H) 578 (H) 469 (H) 329 (H) 254 (H)   Diabetes history: DM  Outpatient Diabetes medications:  None Current orders for Inpatient glycemic control:  Prednisone  40 mg daily Novolog  0-15 units tid with meals and HS Inpatient Diabetes Program Recommendations:   Note elevated blood sugars with steroids. Consider adding Novolog  3 units tid with meals (hold if patient eats less than 50% or NPO) and Semglee  10 units daily.   Addendum:  Spoke to patient at bedside regarding elevated A1C. She states that she was aware that she had diabetes but she has never been on any medications for DM.  She states that she does check her blood sugars.  Discussed current CBG's with steroids.  Should improve as steroids weaned.  ? If patient would benefit from SGLT-2??  Told patient that she would likely need oral DM medication added for her diabetes.  We discussed goal blood sugars as well.     Thanks,  Josefa Ni, RN, BC-ADM Inpatient Diabetes Coordinator Pager [phone]  (8a-5p)

## 2024-03-30 NOTE — Progress Notes (Signed)
 Rounding Note    Patient Name: Amanda [name] Date of Encounter: 03/30/2024  [facility] Cardiologist: Antionette Kirks, MD   Subjective   Reports mild improvement in shortness of breath, "two thirds better" Still slight fullness in the chest Received IV Lasix  twice daily yesterday Reports high fluid intake at home, lots of water,  "Mouth is always dry"  Inpatient Medications    Scheduled Meds:  arformoterol   15 mcg Nebulization BID   aspirin  EC  81 mg Oral Daily   budesonide  (PULMICORT ) nebulizer solution  0.5 mg Nebulization BID   buPROPion   150 mg Oral Daily   cyanocobalamin   1,000 mcg Intramuscular Daily   Followed by   Cecily Cohen ON 04/05/2024] vitamin B-12  1,000 mcg Oral Daily   donepezil   5 mg Oral QHS   ezetimibe   10 mg Oral Daily   FLUoxetine   40 mg Oral Daily   furosemide   40 mg Intravenous BID   gabapentin   300 mg Oral BID   insulin  aspart  0-15 Units Subcutaneous TID WC   insulin  aspart  0-5 Units Subcutaneous QHS   insulin  aspart  3 Units Subcutaneous TID WC   insulin  glargine-yfgn  10 Units Subcutaneous Daily   ipratropium-albuterol   3 mL Nebulization BID   metoprolol  tartrate  25 mg Oral BID   pantoprazole   40 mg Oral Daily   predniSONE   40 mg Oral Q breakfast   rosuvastatin   40 mg Oral Daily   Continuous Infusions:  PRN Meds: acetaminophen , albuterol , clonazePAM , guaiFENesin , nitroGLYCERIN , ondansetron  (ZOFRAN ) IV, mouth rinse, oxyCODONE    Vital Signs    Vitals:   03/29/24 2330 03/30/24 0406 03/30/24 0818 03/30/24 0845  BP: 123/88 (!) 118/56  99/80  Pulse: 84 72  100  Resp: 20 18    Temp: 98.7 F (37.1 C) 98.2 F (36.8 C)  97.8 F (36.6 C)  TempSrc:      SpO2: 96% 96% 96% 96%  Weight:      Height:        Intake/Output Summary (Last 24 hours) at 03/30/2024 1102 Last data filed at 03/30/2024 0519 Gross per 24 hour  Intake 866.87 ml  Output 600 ml  Net 266.87 ml      03/29/2024    5:15 AM 03/29/2024    3:04 AM 02/27/2024     3:05 PM  Last 3 Weights  Weight (lbs) 234 lb 9.1 oz 235 lb 235 lb  Weight (kg) 106.4 kg 106.595 kg 106.595 kg      Telemetry    Normal sinus rhythm- Personally Reviewed  ECG     - Personally Reviewed  Physical Exam   GEN: No acute distress.   Neck: No JVD Cardiac: RRR, no murmurs, rubs, or gallops.  Respiratory: Clear to auscultation bilaterally. GI: Soft, nontender, non-distended  MS: No edema; No deformity. Neuro:  Nonfocal  Psych: Normal affect   Labs    High Sensitivity Troponin:   Recent Labs  Lab 03/29/24 0146 03/29/24 0319 03/29/24 0859 03/29/24 1010  TROPONINIHS 103* 451* 610* 514*     Chemistry Recent Labs  Lab 03/29/24 0146 03/30/24 0429  NA 137 134*  K 4.0 3.9  CL 105 101  CO2 23 24  GLUCOSE 172* 227*  BUN 10 20  CREATININE 0.80 0.95  CALCIUM  9.2 9.3  MG 2.1 2.3  PROT 6.9  --   ALBUMIN 4.1  --   AST 24  --   ALT 14  --   ALKPHOS 37*  --  BILITOT 0.9  --   GFRNONAA >60 >60  ANIONGAP 9 9    Lipids No results for input(s): "CHOL", "TRIG", "HDL", "LABVLDL", "LDLCALC", "CHOLHDL" in the last 168 hours.  Hematology Recent Labs  Lab 03/29/24 0146 03/30/24 0429  WBC 7.1 11.6*  RBC 3.99 3.58*  HGB 13.2 12.0  HCT 38.8 35.3*  MCV 97.2 98.6  MCH 33.1 33.5  MCHC 34.0 34.0  RDW 11.5 11.6  PLT 146* 179   Thyroid  No results for input(s): "TSH", "FREET4" in the last 168 hours.  BNP Recent Labs  Lab 03/29/24 0146  BNP 39.2    DDimer No results for input(s): "DDIMER" in the last 168 hours.   Radiology    DG Chest Port 1 View Result Date: 03/29/2024 CLINICAL DATA:  Shortness of breath. EXAM: PORTABLE CHEST 1 VIEW COMPARISON:  December 17, 2023 FINDINGS: The heart size and mediastinal contours are within normal limits. There is marked severity calcification of the thoracic aorta. An artificial aortic valve is also seen. The lungs are hyperinflated with stable elevation of the posterior aspect of the mid left hemithorax. Mild, diffuse,  chronic appearing increased interstitial lung markings are noted. There is no evidence of acute infiltrate, pleural effusion or pneumothorax. Postoperative changes are seen within the lower cervical spine. Multilevel degenerative changes are present throughout the thoracic spine. IMPRESSION: Stable chronic and postoperative changes without evidence of acute cardiopulmonary disease. Electronically Signed   By: Virgle Grime M.D.   On: 03/29/2024 02:06    Cardiac Studies   Echo January 21, 2024 1. Left ventricular ejection fraction, by estimation, is 65 to 70%. Left  ventricular ejection fraction by 3D volume is 60 %. The left ventricle has  normal function. The left ventricle has no regional wall motion  abnormalities. There is mild concentric  left ventricular hypertrophy. Left ventricular diastolic parameters are  consistent with Grade I diastolic dysfunction (impaired relaxation). The  average left ventricular global longitudinal strain is -19.2 %. The global  longitudinal strain is normal.   2. Right ventricular systolic function is normal. The right ventricular  size is normal.   3. Left atrial size was mildly dilated.   4. The mitral valve is normal in structure. Trivial mitral valve  regurgitation. No evidence of mitral stenosis.   5. The aortic valve is normal in structure. Aortic valve regurgitation is  not visualized. No aortic stenosis is present. There is a 34 Medtronic  CoreValve-Evolut Pro prosthetic (TAVR) valve present in the aortic  position. Procedure Date: 12/21/2023.  Aortic valve area, by VTI measures 3.19 cm. Aortic valve mean gradient  measures 6.0 mmHg. Aortic valve Vmax measures 1.74 m/s.   6. The inferior vena cava is normal in size with greater than 50%  respiratory variability, suggesting right atrial pressure of 3 mmHg.   Patient Profile     Amanda [name] is a 73 y.o. female with a hx of RBBB, diet controlled DM, HTN, HLD, mobid obesity, CKD II,  non-obstructive CAD (LHC 09/2023), COPD with prior tobacco use, mild cognitive impairment and severe aortic stenosis s/p TAVR (12/2023) who is being seen 03/29/2024 for the evaluation of elevated troponin   Assessment & Plan    Elevated troponin Likely supply/demand mismatch/demand ischemia in the setting of acute on chronic respiratory failure - Recent catheterization October 2024 with very mild nonobstructive disease -It with heparin  infusion this admission, can wean off today -Denies chest pain concerning for angina, EKG unchanged No plan for further ischemic workup  Acute on chronic respiratory failure - Started on DuoNebs, steroids,  Presentation suggestive of acute on chronic diastolic CHF - Continue IV Lasix  twice daily -Stressed importance of moderating her fluid intake at home   Aortic valve stenosis, status post TAVR Echo February 2025 well-functioning prosthetic valve No strong indication for repeat echocardiogram   Essential hypertension Blood pressure elevated on arrival Improved with diuresis  COPD with chronic respiratory failure Initially requiring CPAP with transition to BiPAP on arrival now on 2 L nasal cannula   Diabetes type 2 A1c 6.4 Carbohydrate restriction recommended  For questions or updates, please contact [facility] Please consult [email or web address] for contact info under        Signed, Takera Rayl, MD  03/30/2024, 11:02 AM

## 2024-03-30 NOTE — Plan of Care (Signed)
  Problem: Clinical Measurements: Goal: Respiratory complications will improve Outcome: Progressing   Problem: Clinical Measurements: Goal: Cardiovascular complication will be avoided Outcome: Progressing   Problem: Activity: Goal: Risk for activity intolerance will decrease Outcome: Progressing   Problem: Coping: Goal: Level of anxiety will decrease Outcome: Progressing   Problem: Education: Goal: Knowledge of disease or condition will improve Outcome: Progressing   Problem: Respiratory: Goal: Ability to maintain a clear airway will improve Outcome: Progressing

## 2024-03-30 NOTE — Progress Notes (Signed)
 [facility] Progress Note  Patient: Amanda [name]    ZOX:[id number]  DOA: 03/29/2024     Date of Service: the patient was seen and examined on 03/30/2024  No chief complaint on file.  Brief hospital course: Amanda [name] is a 73 y.o. female with medical history significant for DM, HTN, mild nonobstructive CAD on left heart cath 09/2023, COPD, aortic stenosis s/p TAVR 12/2023 being admitted with COPD exacerbation and elevated troponin. She presented by EMS for shortness of breath unrelieved with home nebulizers.  She arrived on CPAP by EMS.  States she has chest pain that feels like something heavy sitting on her chest.  Denies cough, fever or chills ED course and data review: Transition to BiPAP on arrival.  Tachycardic to 101, saturating at 98 on O2 at 2 L.  Afebrile with SBP in the 150s to 160s. Labs notable for the following: ABG on BiPAP within normal limits unremarkable CBC and CMP, normal lactic acid and negative respiratory viral panel Troponin 101, BNP pendingEKG, Personally viewed and interpreted showing sinus at 98 with RBBB Chest x-ray nonacute   Treatment in the ED: Solu-Medrol , magnesium  Heparin  bolus with infusion   Hospitalist consulted for admission.     Assessment and Plan: # COPD with acute exacerbation (HCC) # Acute respiratory failure with hypoxia Patient in acute respiratory distress arrived on CPAP transitioned to BiPAP upon arrival Continue BiPAP and wean as tolerated Schedule and as needed nebulized bronchodilators IV steroids, guaifenesin  Flutter valve   # Chronic diastolic CHF exacerbation As per cardio acute hypoxic respiratory failure could be due to pulmonary edema so started Lasix  40 mg IV twice daily Fluid restriction 1.5 L/day Monitor intake and output and daily body weight Monitor renal functions  # Mild nonobstructive CAD on [facility] 09/2023 Elevated troponin likely secondary to demand ischemia Patient was started on heparin  infusion in  the ED S/p heparin  infusion DC'd on 4/24 as per cardio  Continue aspirin , metoprolol , rosuvastatin , lisinopril  Cardiology consulted, no further plan for ischemic workup, patient denies any chest pain and EKG unchanged.     GAD (generalized anxiety disorder) Continue home bupropion  and fluoxetine    Severe aortic stenosis s/p TAVR 12/2023 No acute issues suspected   Essential hypertension Continue lisinopril  and metoprolol    Controlled type 2 diabetes mellitus with diabetic autonomic neuropathy, without long-term current use of insulin  (HCC) Sliding scale insulin  coverage   Vitamin B12 deficiency: Started vitamin B12 1000 mcg IM injection daily during hospital stay, followed by oral supplement.  Follow-up PCP to repeat vitamin B12 level after 3 to 6 months.   Body mass index is 36.73 kg/m.  Interventions:   Diet: Carb modified diet, fluid restriction 1.5 L/day DVT Prophylaxis: Subcutaneous Lovenox    Advance goals of care discussion: Full code  Family Communication: family was not present at bedside, at the time of interview.  The pt provided permission to discuss medical plan with the family. Opportunity was given to ask question and all questions were answered satisfactorily.   Disposition:  Pt is from home, admitted with acute hypoxic respiratory failure, COPD and CHF, still on IV Lasix , which precludes a safe discharge. Discharge to home, when stable and cleared by cardiology, most likely tomorrow a.m.  Subjective: No significant events overnight, patient feels improvement in the shortness of breath, lower extremity edema resolved.  Denied any chest pain or palpitations.  Feels a lot better.  Physical Exam: General: NAD, lying comfortably Appear in no distress, affect appropriate Eyes: PERRLA ENT: Oral  Mucosa Clear, moist  Neck: no JVD,  Cardiovascular: S1 and S2 Present, no Murmur,  Respiratory: Equal air entry bilaterally, mild bibasilar crackles, no wheezes Abdomen:  Bowel Sound present, Soft and no tenderness,  Skin: no rashes Extremities: no Pedal edema, no calf tenderness Neurologic: without any new focal findings Gait not checked due to patient safety concerns  Vitals:   03/30/24 0818 03/30/24 0845 03/30/24 1154 03/30/24 1625  BP:  99/80 (!) 154/103 (!) 80/43  Pulse:  100 82 81  Resp:      Temp:  97.8 F (36.6 C) 98.3 F (36.8 C) 98 F (36.7 C)  TempSrc:   Oral   SpO2: 96% 96% 94% 92%  Weight:      Height:        Intake/Output Summary (Last 24 hours) at 03/30/2024 1736 Last data filed at 03/30/2024 1300 Gross per 24 hour  Intake 789.68 ml  Output --  Net 789.68 ml   Filed Weights   03/29/24 0304 03/29/24 0515  Weight: 106.6 kg 106.4 kg    Data Reviewed: I have personally reviewed and interpreted daily labs, tele strips, imagings as discussed above. I reviewed all nursing notes, pharmacy notes, vitals, pertinent old records I have discussed plan of care as described above with RN and patient/family.  CBC: Recent Labs  Lab 03/29/24 0146 03/30/24 0429  WBC 7.1 11.6*  NEUTROABS 5.1  --   HGB 13.2 12.0  HCT 38.8 35.3*  MCV 97.2 98.6  PLT 146* 179   Basic Metabolic Panel: Recent Labs  Lab 03/29/24 0146 03/30/24 0429  NA 137 134*  K 4.0 3.9  CL 105 101  CO2 23 24  GLUCOSE 172* 227*  BUN 10 20  CREATININE 0.80 0.95  CALCIUM  9.2 9.3  MG 2.1 2.3  PHOS  --  3.8    Studies: No results found.  Scheduled Meds:  arformoterol   15 mcg Nebulization BID   aspirin  EC  81 mg Oral Daily   budesonide  (PULMICORT ) nebulizer solution  0.5 mg Nebulization BID   buPROPion   150 mg Oral Daily   cyanocobalamin   1,000 mcg Intramuscular Daily   Followed by   Cecily Cohen ON 04/05/2024] vitamin B-12  1,000 mcg Oral Daily   donepezil   5 mg Oral QHS   empagliflozin   10 mg Oral Daily   [START ON 03/31/2024] enoxaparin  (LOVENOX ) injection  0.5 mg/kg Subcutaneous Q24H   ezetimibe   10 mg Oral Daily   FLUoxetine   40 mg Oral Daily   furosemide    40 mg Intravenous BID   gabapentin   300 mg Oral BID   insulin  aspart  0-15 Units Subcutaneous TID WC   insulin  aspart  0-5 Units Subcutaneous QHS   insulin  aspart  3 Units Subcutaneous TID WC   insulin  glargine-yfgn  10 Units Subcutaneous Daily   ipratropium-albuterol   3 mL Nebulization BID   metoprolol  tartrate  25 mg Oral BID   pantoprazole   40 mg Oral Daily   predniSONE   40 mg Oral Q breakfast   rosuvastatin   40 mg Oral Daily   spironolactone   25 mg Oral Daily   Continuous Infusions: PRN Meds: acetaminophen , albuterol , clonazePAM , guaiFENesin , guaiFENesin -dextromethorphan , nitroGLYCERIN , ondansetron  (ZOFRAN ) IV, mouth rinse, oxyCODONE   Time spent: 55 minutes  Author: Althia Atlas. MD [facility] 03/30/2024 5:36 PM  To reach On-call, see care teams to locate the attending and reach out to them via [email or web address]. If 7PM-7AM, please contact night-coverage If you still have difficulty reaching the attending provider, please page the  DOC (Director on Call) for [facility] on amion for assistance.

## 2024-03-30 NOTE — Progress Notes (Signed)
 PHARMACY - ANTICOAGULATION CONSULT NOTE  Pharmacy Consult for Heparin   Indication: chest pain/ACS  Allergies  Allergen Reactions   Honey Bee Venom [Bee Venom] Anaphylaxis   Diazepam Other (See Comments)    REACTION: pt states she gets very angry and abusive verbally on this medication    Patient Measurements: Height: 5' 7.01" (170.2 cm) Weight: 106.4 kg (234 lb 9.1 oz) IBW/kg (Calculated) : 61.62 HEPARIN  DW (KG): 85.8  Vital Signs: Temp: 98.2 F (36.8 C) (04/24 0406) BP: 118/56 (04/24 0406) Pulse Rate: 72 (04/24 0406)  Labs: Recent Labs    03/29/24 0146 03/29/24 0319 03/29/24 0859 03/29/24 1010 03/29/24 1822 03/30/24 0429  HGB 13.2  --   --   --   --  12.0  HCT 38.8  --   --   --   --  35.3*  PLT 146*  --   --   --   --  179  HEPARINUNFRC  --   --   --  0.47 0.46 0.57  CREATININE 0.80  --   --   --   --  0.95  TROPONINIHS 103* 451* 610* 514*  --   --    Estimated Creatinine Clearance: 66.2 mL/min (by C-G formula based on SCr of 0.95 mg/dL).  Medical History: Past Medical History:  Diagnosis Date   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 sting    Aortic stenosis    a. 2013: nl LV sys fxn, mild MR, no evidence of pulm htn; b. TTE 8/17: EF 60-65%, no RWMA, nl LV dia fxn,  mild AS, mod AI   CAD (coronary artery disease), native coronary artery 11/26/2020   Controlled type 2 diabetes mellitus with diabetic autonomic neuropathy, without long-term current use of insulin  (HCC) 08/10/2007   COPD (chronic obstructive pulmonary disease) (HCC) 10/30/2016   Depression    GAD (generalized anxiety disorder)    GERD (gastroesophageal reflux disease)    Hiatal hernia with gastroesophageal reflux 1997   Hyperlipidemia    Hypertension    Osteoporosis    S/P TAVR (transcatheter aortic valve replacement) 12/21/2023   s/p TAVR with a 34 mm Evolut FX via the TF approach by Dr. Lorie Rook & Dr. Honey Lusty   Tobacco abuse     Assessment: 73yo female with PMH significant for  DM, HTN, mild nonobstructive CAD  on left heart cath 09/2023, COPD, aortic stenosis s/p TAVR 12/2023 admitted to ED with respiratory distress. Troponin trending upward and pharmacy consulted to start and manage heparin  infusion for ACS.  No prior anticoagulation noted.   Baseline hgb and plt appropriate to start heparin  infusion. CrCl = 78.7 ml/min  Date Time Results Comments 4/23 1010 HL 0.47 Therapeutic level with heparin  rate 1200 u/h 4/23 1822 HL 0.46 Therapeutic level x 2 with heparin  rate 1200 units/hr 4/24     0429    HL 0.57           Therapeutic X 3   Goal of Therapy:  Heparin  level 0.3-0.7 units/ml Monitor platelets by anticoagulation protocol: Yes   Plan:  Heparin  level therapeutic x 3 Continue heparin  infusion at 1200 units per hour Recheck HL on 4/25 with AM Labs.  Continue to monitor H&H and platelets  Jelicia Nantz D Clinical Pharmacist  03/30/2024 6:43 AM

## 2024-03-31 ENCOUNTER — Other Ambulatory Visit (HOSPITAL_COMMUNITY): Payer: Self-pay

## 2024-03-31 ENCOUNTER — Other Ambulatory Visit: Payer: Self-pay

## 2024-03-31 ENCOUNTER — Telehealth: Payer: Self-pay | Admitting: Emergency Medicine

## 2024-03-31 DIAGNOSIS — J441 Chronic obstructive pulmonary disease with (acute) exacerbation: Secondary | ICD-10-CM | POA: Diagnosis not present

## 2024-03-31 DIAGNOSIS — Z79899 Other long term (current) drug therapy: Secondary | ICD-10-CM

## 2024-03-31 LAB — BASIC METABOLIC PANEL WITH GFR
Anion gap: 8 (ref 5–15)
BUN: 25 mg/dL — ABNORMAL HIGH (ref 8–23)
CO2: 25 mmol/L (ref 22–32)
Calcium: 8.8 mg/dL — ABNORMAL LOW (ref 8.9–10.3)
Chloride: 100 mmol/L (ref 98–111)
Creatinine, Ser: 0.85 mg/dL (ref 0.44–1.00)
GFR, Estimated: >60 mL/min (ref >60)
Glucose, Bld: 275 mg/dL — ABNORMAL HIGH (ref 70–99)
Potassium: 3.8 mmol/L (ref 3.5–5.1)
Sodium: 133 mmol/L — ABNORMAL LOW (ref 135–145)

## 2024-03-31 LAB — PHOSPHORUS: Phosphorus: 3.4 mg/dL (ref 2.5–4.6)

## 2024-03-31 LAB — CBC
HCT: 36.1 % (ref 36.0–46.0)
Hemoglobin: 12.1 g/dL (ref 12.0–15.0)
MCH: 33.4 pg (ref 26.0–34.0)
MCHC: 33.5 g/dL (ref 30.0–36.0)
MCV: 99.7 fL (ref 80.0–100.0)
Platelets: 172 10*3/uL (ref 150–400)
RBC: 3.62 MIL/uL — ABNORMAL LOW (ref 3.87–5.11)
RDW: 11.7 % (ref 11.5–15.5)
WBC: 9.7 10*3/uL (ref 4.0–10.5)
nRBC: 0 % (ref 0.0–0.2)

## 2024-03-31 LAB — GLUCOSE, CAPILLARY
Glucose-Capillary: 175 mg/dL — ABNORMAL HIGH (ref 70–99)
Glucose-Capillary: 185 mg/dL — ABNORMAL HIGH (ref 70–99)

## 2024-03-31 LAB — MAGNESIUM: Magnesium: 2.4 mg/dL (ref 1.7–2.4)

## 2024-03-31 MED ORDER — BUDESONIDE-FORMOTEROL FUMARATE 160-4.5 MCG/ACT IN AERO
1.0000 | INHALATION_SPRAY | Freq: Two times a day (BID) | RESPIRATORY_TRACT | 3 refills | Status: DC
  Filled 2024-03-31: qty 10.2, 30d supply, fill #0
  Filled 2024-03-31: qty 60, 30d supply, fill #0

## 2024-03-31 MED ORDER — EMPAGLIFLOZIN 10 MG PO TABS
10.0000 mg | ORAL_TABLET | Freq: Every day | ORAL | 11 refills | Status: DC
  Filled 2024-03-31: qty 30, 30d supply, fill #0

## 2024-03-31 MED ORDER — SPIRONOLACTONE 25 MG PO TABS
25.0000 mg | ORAL_TABLET | Freq: Every day | ORAL | 11 refills | Status: DC
  Filled 2024-03-31: qty 30, 30d supply, fill #0

## 2024-03-31 MED ORDER — FUROSEMIDE 40 MG PO TABS
40.0000 mg | ORAL_TABLET | Freq: Every day | ORAL | 11 refills | Status: DC
  Filled 2024-03-31: qty 30, 30d supply, fill #0

## 2024-03-31 NOTE — Care Management Important Message (Signed)
 Important Message  Patient Details  Name: Amanda [name] MRN: [id number] Date of Birth: Dec 12, 1950   Important Message Given:  Yes - Medicare IM     Anise Kerns 03/31/2024, 12:54 PM

## 2024-03-31 NOTE — Discharge Summary (Signed)
 [facility] Discharge Summary   Patient: Amanda [name]:[id number]  PCP: Scherrie Curt, MD  Date of admission: 03/29/2024   Date of discharge:  03/31/2024     Discharge Diagnoses:  Principal Problem:   COPD with acute exacerbation (HCC) Active Problems:   Acute respiratory failure with hypoxia (HCC)   CAD (coronary artery disease), native coronary artery   Elevated troponin   Controlled type 2 diabetes mellitus with diabetic autonomic neuropathy, without long-term current use of insulin  (HCC)   Essential hypertension   Aortic valve stenosis   S/P TAVR (transcatheter aortic valve replacement)   GAD (generalized anxiety disorder)   COPD exacerbation (HCC)   Respiratory distress   Demand ischemia (HCC)   Acute on chronic respiratory failure with hypoxia and hypercapnia (HCC)   Acute on chronic diastolic CHF (congestive heart failure) (HCC)   Admitted From: Home Disposition:  Home   Recommendations for Outpatient Follow-up:  Follow-up with PCP in 1 week Follow-up with cardiology in 1 week and check BMP after 1 week Follow up LABS/TEST:     Follow-up Information     Copland, Spencer, MD Follow up in 1 week(s).   Specialties: Family Medicine, Sports Medicine Contact information: [address] [place] [address] [phone]         Wenona Hamilton, MD Follow up in 1 week(s).   Specialty: Cardiology Contact information: [address] [place] [address] [phone]                Diet recommendation: Cardiac diet, fluid restriction 1.5 L/day  Activity: The patient is advised to gradually reintroduce usual activities, as tolerated  Discharge Condition: stable  Code Status: Full code   History of present illness: As per the H and P dictated on admission Hospital Course:  Amanda [name] is a 73 y.o. female with medical history significant for DM, HTN, mild nonobstructive CAD on left heart cath 09/2023, COPD,  aortic stenosis s/p TAVR 12/2023 being admitted with COPD exacerbation and elevated troponin. She presented by EMS for shortness of breath unrelieved with home nebulizers.  She arrived on CPAP by EMS.  States she has chest pain that feels like something heavy sitting on her chest.  Denies cough, fever or chills ED course and data review: Transition to BiPAP on arrival.  Tachycardic to 101, saturating at 98 on O2 at 2 L.  Afebrile with SBP in the 150s to 160s. Labs notable for the following: ABG on BiPAP within normal limits unremarkable CBC and CMP, normal lactic acid and negative respiratory viral panel Troponin 101, BNP pendingEKG, Personally viewed and interpreted showing sinus at 98 with RBBB Chest x-ray nonacute   Treatment in the ED: Solu-Medrol , magnesium  Heparin  bolus with infusion   Hospitalist consulted for admission.      Assessment and Plan:  # COPD with acute exacerbation (HCC) # Acute respiratory failure with hypoxia Patient in acute respiratory distress arrived on CPAP transitioned to BiPAP upon arrival. S/p BiPAP and weaned off gradually, s/p Scheduled and as needed nebulized bronchodilators. S/p IV steroids, guaifenesin .  Patient was discharged on Advair  inhaler and albuterol  as needed.  # Chronic diastolic CHF exacerbation As per cardio acute hypoxic respiratory failure could be due to pulmonary edema so s/p Lasix  40 mg IV twice daily, Fluid restriction 1.5 L/day Seen by cardiology, recommended to discharge on Lasix  40 mg p.o. daily, started Aldactone  25 mg p.o. daily and repeat BMP after 1 week and follow-up as  an outpatient.   # Mild nonobstructive CAD on [facility] 09/2023 Elevated troponin likely secondary to demand ischemia. S/p heparin  infusion in the ED, DC'd on 4/24 as per cardio. Continue aspirin , metoprolol , rosuvastatin , lisinopril  Cardiology consulted, patient denied any chest pain and EKG unchanged.  Patient had cardiac cath last year before TAVR which showed mild  nonobstructive disease.  No plan for ischemic cardiac evaluation.   # GAD (generalized anxiety disorder): Continue home bupropion  and fluoxetine  # Severe aortic stenosis s/p TAVR 12/2023: No acute issues suspected # Essential hypertension: Continue lisinopril  and metoprolol  # Controlled type 2 diabetes mellitus with diabetic autonomic neuropathy, without long-term current use of insulin : s/p Sliding scale insulin  coverage.  Patient was started on Jardiance  due to CHF.  She is not on any other antidiabetic medication.  Monitor CBG and continue diabetic diet.  Resumed gabapentin  300 mg p.o. twice daily home dose   # Vitamin B12 deficiency: Started vitamin B12 1000 mcg IM injection daily during hospital stay, followed by oral supplement.  Follow-up PCP to repeat vitamin B12 level after 3 to 6 months.   Body mass index is 36.73 kg/m.  Nutrition Interventions:  - Patient was instructed, not to drive, operate heavy machinery, perform activities at heights, swimming or participation in water activities or provide baby sitting services while on Pain, Sleep and Anxiety Medications; until her outpatient Physician has advised to do so again.  - Also recommended to not to take more than prescribed Pain, Sleep and Anxiety Medications.  Patient was ambulatory without any assistance. On the day of the discharge the patient's vitals were stable, and no other acute medical condition were reported by patient. the patient was felt safe to be discharge at Home.  Consultants: Cardiology Procedures: None  Discharge Exam: General: Appear in no distress, no Rash; Oral Mucosa Clear, moist. Cardiovascular: S1 and S2 Present, no Murmur, Respiratory: normal respiratory effort, Bilateral Air entry present and no Crackles, no wheezes Abdomen: Bowel Sound present, Soft and no tenderness, no hernia Extremities: no Pedal edema, no calf tenderness Neurology: alert and oriented to time, place, and person affect  appropriate.  Filed Weights   03/29/24 0304 03/29/24 0515  Weight: 106.6 kg 106.4 kg   Vitals:   03/31/24 0809 03/31/24 1237  BP: 124/64 118/72  Pulse: 61 86  Resp:    Temp: 98 F (36.7 C) 98.5 F (36.9 C)  SpO2: 100% (!) 89%    DISCHARGE MEDICATION: Allergies as of 03/31/2024       Reactions   Honey Bee Venom [bee Venom] Anaphylaxis   Diazepam Other (See Comments)   REACTION: pt states she gets very angry and abusive verbally on this medication        Medication List     STOP taking these medications    b complex vitamins capsule   lisinopril  5 MG tablet Commonly known as: ZESTRIL    ondansetron  4 MG disintegrating tablet Commonly known as: ZOFRAN -ODT       TAKE these medications    acetaminophen  500 MG tablet Commonly known as: TYLENOL  Take 1,000 mg by mouth every 8 (eight) hours as needed for mild pain (pain score 1-3) or moderate pain (pain score 4-6).   albuterol  108 (90 Base) MCG/ACT inhaler Commonly known as: VENTOLIN  HFA Inhale 2 puffs into the lungs every 4 (four) hours as needed for wheezing or shortness of breath.   amitriptyline  50 MG tablet Commonly known as: ELAVIL  Take 0.5 tablets (25 mg total) by mouth at bedtime.  aspirin  EC 81 MG tablet Take 81 mg by mouth daily.   buPROPion  150 MG 24 hr tablet Commonly known as: WELLBUTRIN  XL Take 1 tablet (150 mg total) by mouth daily.   calcium  carbonate 500 MG chewable tablet Commonly known as: TUMS - dosed in mg elemental calcium  Chew 2 tablets by mouth daily as needed for indigestion or heartburn.   clonazePAM  0.5 MG tablet Commonly known as: KLONOPIN  Take 1 tablet by mouth twice daily as needed   cyanocobalamin  1000 MCG/ML injection Commonly known as: VITAMIN B12 Inject into the muscle.   cyclobenzaprine  10 MG tablet Commonly known as: FLEXERIL  Take 1 tablet by mouth three times daily as needed for muscle spasm   dimenhyDRINATE 50 MG tablet Commonly known as: DRAMAMINE Take 50  mg by mouth every 8 (eight) hours as needed for nausea or dizziness.   donepezil  5 MG tablet Commonly known as: ARICEPT  TAKE 1 TABLET BY MOUTH AT BEDTIME   empagliflozin  10 MG Tabs tablet Commonly known as: JARDIANCE  Take 1 tablet (10 mg total) by mouth daily. Start taking on: April 01, 2024   ezetimibe  10 MG tablet Commonly known as: ZETIA  Take 1 tablet by mouth once daily   FLUoxetine  40 MG capsule Commonly known as: PROZAC  Take 80 mg by mouth M, W, F and 40 mg by mouth Tues, Thurs, Sat and Sunday   fluticasone  50 MCG/ACT nasal spray Commonly known as: FLONASE Place 2 sprays into both nostrils daily as needed for rhinitis.   fluticasone -salmeterol 250-50 MCG/ACT Aepb Commonly known as: ADVAIR  Inhale 1 puff into the lungs in the morning and at bedtime.   furosemide  40 MG tablet Commonly known as: Lasix  Take 1 tablet (40 mg total) by mouth daily.   gabapentin  300 MG capsule Commonly known as: Neurontin  Take 1 capsule (300 mg total) by mouth 2 (two) times daily.   guaiFENesin  600 MG 12 hr tablet Commonly known as: MUCINEX  Take 600 mg by mouth 2 (two) times daily as needed for to loosen phlegm.   metoprolol  tartrate 25 MG tablet Commonly known as: LOPRESSOR  TAKE 1/2 TABLET TWICE DAILY   multivitamin tablet Take 1 tablet by mouth daily.   naproxen  sodium 220 MG tablet Commonly known as: ALEVE  Take 220 mg by mouth in the morning.   pantoprazole  40 MG tablet Commonly known as: PROTONIX  TAKE 1 TABLET 30 MINUTES PRIOR TO BREAKFAST   rosuvastatin  40 MG tablet Commonly known as: CRESTOR  Take 1 tablet by mouth once daily   Simethicone  125 MG Caps Take 125 mg by mouth daily as needed (gas).   spironolactone  25 MG tablet Commonly known as: ALDACTONE  Take 1 tablet (25 mg total) by mouth daily. Start taking on: April 01, 2024   vitamin C  1000 MG tablet Take 1,000 mg by mouth daily.   Vitamin D  1000 units capsule Take 2,000 Units by mouth daily.   vitamin E  180 MG (400 UNITS) capsule Take 400 Units by mouth daily.       Allergies  Allergen Reactions   Honey Bee Venom [Bee Venom] Anaphylaxis   Diazepam Other (See Comments)    REACTION: pt states she gets very angry and abusive verbally on this medication    Discharge Instructions     Call MD for:  difficulty breathing, headache or visual disturbances   Complete by: As directed    Call MD for:  extreme fatigue   Complete by: As directed    Call MD for:  persistant dizziness or light-headedness  Complete by: As directed    Call MD for:  persistant nausea and vomiting   Complete by: As directed    Call MD for:  severe uncontrolled pain   Complete by: As directed    Call MD for:  temperature >100.4   Complete by: As directed    Diet - low sodium heart healthy   Complete by: As directed    Diet Carb Modified   Complete by: As directed    Discharge instructions   Complete by: As directed    Follow-up with PCP in 1 week Follow-up with cardiology in 1 week and check BMP after 1 week   Increase activity slowly   Complete by: As directed        The results of significant diagnostics from this hospitalization (including imaging, microbiology, ancillary and laboratory) are listed below for reference.    Significant Diagnostic Studies: DG Chest Port 1 View Result Date: 03/29/2024 CLINICAL DATA:  Shortness of breath. EXAM: PORTABLE CHEST 1 VIEW COMPARISON:  December 17, 2023 FINDINGS: The heart size and mediastinal contours are within normal limits. There is marked severity calcification of the thoracic aorta. An artificial aortic valve is also seen. The lungs are hyperinflated with stable elevation of the posterior aspect of the mid left hemithorax. Mild, diffuse, chronic appearing increased interstitial lung markings are noted. There is no evidence of acute infiltrate, pleural effusion or pneumothorax. Postoperative changes are seen within the lower cervical spine. Multilevel  degenerative changes are present throughout the thoracic spine. IMPRESSION: Stable chronic and postoperative changes without evidence of acute cardiopulmonary disease. Electronically Signed   By: Virgle Grime M.D.   On: 03/29/2024 02:06    Microbiology: Recent Results (from the past 240 hours)  Culture, blood (routine x 2)     Status: None (Preliminary result)   Collection Time: 03/29/24  1:32 AM   Specimen: BLOOD  Result Value Ref Range Status   Specimen Description BLOOD BLOOD RIGHT ARM  Final   Special Requests   Final    BOTTLES DRAWN AEROBIC AND ANAEROBIC Blood Culture results may not be optimal due to an excessive volume of blood received in culture bottles   Culture   Final    NO GROWTH 2 DAYS Performed at [facility], [address], [place], [place] [address]    Report Status PENDING  Incomplete  Resp panel by RT-PCR (RSV, Flu A&B, Covid) Anterior Nasal Swab     Status: None   Collection Time: 03/29/24  1:46 AM   Specimen: Anterior Nasal Swab  Result Value Ref Range Status   SARS Coronavirus 2 by RT PCR NEGATIVE NEGATIVE Final    Comment: (NOTE) SARS-CoV-2 target nucleic acids are NOT DETECTED.  The SARS-CoV-2 RNA is generally detectable in upper respiratory specimens during the acute phase of infection. The lowest concentration of SARS-CoV-2 viral copies this assay can detect is 138 copies/mL. A negative result does not preclude SARS-Cov-2 infection and should not be used as the sole basis for treatment or other patient management decisions. A negative result may occur with  improper specimen collection/handling, submission of specimen other than nasopharyngeal swab, presence of viral mutation(s) within the areas targeted by this assay, and inadequate number of viral copies(<138 copies/mL). A negative result must be combined with clinical observations, patient history, and epidemiological information. The expected result is Negative.  Fact Sheet for  Patients:  BloggerCourse.com  Fact Sheet for Healthcare Providers:  SeriousBroker.it  This test is no t yet approved  or cleared by the [place]  FDA and  has been authorized for detection and/or diagnosis of SARS-CoV-2 by FDA under an Emergency Use Authorization (EUA). This EUA will remain  in effect (meaning this test can be used) for the duration of the COVID-19 declaration under Section 564(b)(1) of the Act, 21 U.S.C.section 360bbb-3(b)(1), unless the authorization is terminated  or revoked sooner.       Influenza A by PCR NEGATIVE NEGATIVE Final   Influenza B by PCR NEGATIVE NEGATIVE Final    Comment: (NOTE) The Xpert Xpress SARS-CoV-2/FLU/RSV plus assay is intended as an aid in the diagnosis of influenza from Nasopharyngeal swab specimens and should not be used as a sole basis for treatment. Nasal washings and aspirates are unacceptable for Xpert Xpress SARS-CoV-2/FLU/RSV testing.  Fact Sheet for Patients: BloggerCourse.com  Fact Sheet for Healthcare Providers: SeriousBroker.it  This test is not yet approved or cleared by the [place]  FDA and has been authorized for detection and/or diagnosis of SARS-CoV-2 by FDA under an Emergency Use Authorization (EUA). This EUA will remain in effect (meaning this test can be used) for the duration of the COVID-19 declaration under Section 564(b)(1) of the Act, 21 U.S.C. section 360bbb-3(b)(1), unless the authorization is terminated or revoked.     Resp Syncytial Virus by PCR NEGATIVE NEGATIVE Final    Comment: (NOTE) Fact Sheet for Patients: BloggerCourse.com  Fact Sheet for Healthcare Providers: SeriousBroker.it  This test is not yet approved or cleared by the [place]  FDA and has been authorized for detection and/or diagnosis of SARS-CoV-2 by FDA under an Emergency  Use Authorization (EUA). This EUA will remain in effect (meaning this test can be used) for the duration of the COVID-19 declaration under Section 564(b)(1) of the Act, 21 U.S.C. section 360bbb-3(b)(1), unless the authorization is terminated or revoked.  Performed at [facility], [address] [place], [place] [address]   Culture, blood (routine x 2)     Status: None (Preliminary result)   Collection Time: 03/29/24  1:50 AM   Specimen: BLOOD  Result Value Ref Range Status   Specimen Description BLOOD BLOOD RIGHT ARM  Final   Special Requests   Final    BOTTLES DRAWN AEROBIC AND ANAEROBIC Blood Culture results may not be optimal due to an excessive volume of blood received in culture bottles   Culture   Final    NO GROWTH 2 DAYS Performed at [facility], [address] [place], [place] [address]    Report Status PENDING  Incomplete     Labs: CBC: Recent Labs  Lab 03/29/24 0146 03/30/24 0429 03/31/24 0344  WBC 7.1 11.6* 9.7  NEUTROABS 5.1  --   --   HGB 13.2 12.0 12.1  HCT 38.8 35.3* 36.1  MCV 97.2 98.6 99.7  PLT 146* 179 172   Basic Metabolic Panel: Recent Labs  Lab 03/29/24 0146 03/30/24 0429 03/31/24 0344  NA 137 134* 133*  K 4.0 3.9 3.8  CL 105 101 100  CO2 23 24 25   GLUCOSE 172* 227* 275*  BUN 10 20 25*  CREATININE 0.80 0.95 0.85  CALCIUM  9.2 9.3 8.8*  MG 2.1 2.3 2.4  PHOS  --  3.8 3.4   Liver Function Tests: Recent Labs  Lab 03/29/24 0146  AST 24  ALT 14  ALKPHOS 37*  BILITOT 0.9  PROT 6.9  ALBUMIN 4.1   No results for input(s): "LIPASE", "AMYLASE" in the last 168 hours. No results for input(s): "AMMONIA" in the  last 168 hours. Cardiac Enzymes: No results for input(s): "CKTOTAL", "CKMB", "CKMBINDEX", "TROPONINI" in the last 168 hours. BNP (last 3 results) Recent Labs    03/29/24 0146  BNP 39.2   CBG: Recent Labs  Lab 03/30/24 1151 03/30/24 1625 03/30/24 2238 03/31/24 0810 03/31/24 1239  GLUCAP 183* 204*  276* 175* 185*    Time spent: 35 minutes  Signed:  Althia Atlas  [facility] 03/31/2024 2:51 PM

## 2024-03-31 NOTE — Progress Notes (Signed)
 Rounding Note    Patient Name: Amanda [name] Date of Encounter: 03/31/2024  [facility] Cardiologist: Antionette Kirks, MD   Subjective   Patient seen on a.m. rounds.  Daughter remains at bedside.  Reports improvement in shortness of breath.  Continues to have slight fullness in the chest.  Reports that she has large fluid intake because she always has dry mouth.  Advised to decrease oral intake.  No I's and O's recorded.  Inpatient Medications    Scheduled Meds:  arformoterol   15 mcg Nebulization BID   aspirin  EC  81 mg Oral Daily   budesonide  (PULMICORT ) nebulizer solution  0.5 mg Nebulization BID   buPROPion   150 mg Oral Daily   cyanocobalamin   1,000 mcg Intramuscular Daily   Followed by   Cecily Cohen ON 04/05/2024] vitamin B-12  1,000 mcg Oral Daily   donepezil   5 mg Oral QHS   empagliflozin   10 mg Oral Daily   enoxaparin  (LOVENOX ) injection  0.5 mg/kg Subcutaneous Q24H   ezetimibe   10 mg Oral Daily   FLUoxetine   40 mg Oral Daily   furosemide   40 mg Intravenous BID   gabapentin   300 mg Oral BID   insulin  aspart  0-15 Units Subcutaneous TID WC   insulin  aspart  0-5 Units Subcutaneous QHS   insulin  aspart  3 Units Subcutaneous TID WC   insulin  glargine-yfgn  10 Units Subcutaneous Daily   ipratropium-albuterol   3 mL Nebulization BID   metoprolol  tartrate  25 mg Oral BID   pantoprazole   40 mg Oral Daily   predniSONE   40 mg Oral Q breakfast   rosuvastatin   40 mg Oral Daily   spironolactone   25 mg Oral Daily   Continuous Infusions:  PRN Meds: acetaminophen , albuterol , clonazePAM , guaiFENesin , guaiFENesin -dextromethorphan , nitroGLYCERIN , ondansetron  (ZOFRAN ) IV, mouth rinse, oxyCODONE    Vital Signs    Vitals:   03/30/24 2037 03/30/24 2356 03/31/24 0418 03/31/24 0809  BP: (!) 148/61 (!) 129/98 138/85 124/64  Pulse: 90 76 76 61  Resp: 20  20   Temp: 98.6 F (37 C) 98.1 F (36.7 C) 98 F (36.7 C) 98 F (36.7 C)  TempSrc:  Oral    SpO2: 96% 100% 100% 100%   Weight:      Height:        Intake/Output Summary (Last 24 hours) at 03/31/2024 0918 Last data filed at 03/30/2024 2234 Gross per 24 hour  Intake 600 ml  Output 600 ml  Net 0 ml      03/29/2024    5:15 AM 03/29/2024    3:04 AM 02/27/2024    3:05 PM  Last 3 Weights  Weight (lbs) 234 lb 9.1 oz 235 lb 235 lb  Weight (kg) 106.4 kg 106.595 kg 106.595 kg      Telemetry    Sinus rhythm rates at 60 and 70- Personally Reviewed  ECG    No new tracings- Personally Reviewed  Physical Exam   GEN: No acute distress.   Neck: No JVD Cardiac: RRR, no murmurs, rubs, or gallops.  Respiratory: Clear to auscultation bilaterally. GI: Soft, nontender, non-distended  MS: No edema; No deformity. Neuro:  Nonfocal  Psych: Normal affect   Labs    High Sensitivity Troponin:   Recent Labs  Lab 03/29/24 0146 03/29/24 0319 03/29/24 0859 03/29/24 1010  TROPONINIHS 103* 451* 610* 514*     Chemistry Recent Labs  Lab 03/29/24 0146 03/30/24 0429 03/31/24 0344  NA 137 134* 133*  K 4.0 3.9 3.8  CL 105 101 100  CO2 23 24 25   GLUCOSE 172* 227* 275*  BUN 10 20 25*  CREATININE 0.80 0.95 0.85  CALCIUM  9.2 9.3 8.8*  MG 2.1 2.3 2.4  PROT 6.9  --   --   ALBUMIN 4.1  --   --   AST 24  --   --   ALT 14  --   --   ALKPHOS 37*  --   --   BILITOT 0.9  --   --   GFRNONAA >60 >60 >60  ANIONGAP 9 9 8     Lipids No results for input(s): "CHOL", "TRIG", "HDL", "LABVLDL", "LDLCALC", "CHOLHDL" in the last 168 hours.  Hematology Recent Labs  Lab 03/29/24 0146 03/30/24 0429 03/31/24 0344  WBC 7.1 11.6* 9.7  RBC 3.99 3.58* 3.62*  HGB 13.2 12.0 12.1  HCT 38.8 35.3* 36.1  MCV 97.2 98.6 99.7  MCH 33.1 33.5 33.4  MCHC 34.0 34.0 33.5  RDW 11.5 11.6 11.7  PLT 146* 179 172   Thyroid  No results for input(s): "TSH", "FREET4" in the last 168 hours.  BNP Recent Labs  Lab 03/29/24 0146  BNP 39.2    DDimer No results for input(s): "DDIMER" in the last 168 hours.   Radiology    No results  found.  Cardiac Studies   Echo January 21, 2024 1. Left ventricular ejection fraction, by estimation, is 65 to 70%. Left  ventricular ejection fraction by 3D volume is 60 %. The left ventricle has  normal function. The left ventricle has no regional wall motion  abnormalities. There is mild concentric  left ventricular hypertrophy. Left ventricular diastolic parameters are  consistent with Grade I diastolic dysfunction (impaired relaxation). The  average left ventricular global longitudinal strain is -19.2 %. The global  longitudinal strain is normal.   2. Right ventricular systolic function is normal. The right ventricular  size is normal.   3. Left atrial size was mildly dilated.   4. The mitral valve is normal in structure. Trivial mitral valve  regurgitation. No evidence of mitral stenosis.   5. The aortic valve is normal in structure. Aortic valve regurgitation is  not visualized. No aortic stenosis is present. There is a 34 [employer]  CoreValve-Evolut Pro prosthetic (TAVR) valve present in the aortic  position. Procedure Date: 12/21/2023.  Aortic valve area, by VTI measures 3.19 cm. Aortic valve mean gradient  measures 6.0 mmHg. Aortic valve Vmax measures 1.74 m/s.   6. The inferior vena cava is normal in size with greater than 50%  respiratory variability, suggesting right atrial pressure of 3 mmHg.   Patient Profile     73 y.o. female with a past medical history of RBBB, diet-controlled diabetes, hypertension, hyperlipidemia, morbid obesity, CKD stage II, nonobstructive CAD (left heart cath 09/2023), COPD with prior tobacco use, mild cognitive impairment severe aortic stenosis status post TAVR (12/2023), who is being seen and evaluated for concerns of elevated high-sensitivity troponin.  Assessment & Plan    Elevated high-sensitivity troponin/nonobstructive coronary artery disease -Suspect supply demand mismatch/demand ischemia in the setting of acute on chronic respiratory  failure -Recent heart catheterization in October 2024 with very mild nonobstructive disease -Previously had been on heparin  infusion that was discontinued -Continues to deny chest pain concerning for angina -EKG was nonacute -No plans for further ischemic workup -Continued on aspirin  and statin therapy -EKG as needed for pain or changes  Acute on chronic respiratory failure -Continued on DuoNebs and steroids -Continue IV Lasix  twice daily,  recommend transitioning to oral dosing to prepare for discharge -No I's and O's documented -Stressed importance of continuing to decrease her fluid intake at home  Severe aortic valve stenosis status post TAVR/diastolic dysfunction -Surgery completed in January 2025 -Repeat echocardiogram 2025 well-functioning prosthetic valve -No current indication for repeat echocardiogram - Appears to be euvolemic on exam - Serum creatinine has remained stable, strict inputs and outputs, daily weights with ongoing diuresis - Continued on Jardiance  10 mg daily, Lasix  40 mg IV twice daily, and spironolactone  25 mg daily  Primary hypertension -Blood pressure 124/64 -Continued on current medication regimen -Vital signs per unit protocol  COPD with chronic respiratory failure -Initially required CPAP with transition to BiPAP -Maintaining oxygen saturations on room air -Supportive care -COPD not currently in exacerbation -Ongoing management per IM  Type 2 diabetes -Hemoglobin A1c 6.4 -Sliding scale insulin  -Ongoing management per IM     For questions or updates, please contact [facility] Please consult [email or web address] for contact info under        Signed, Berklie Dethlefs, NP  03/31/2024, 9:18 AM

## 2024-03-31 NOTE — Telephone Encounter (Signed)
-----   Message from [facility] sent at 03/31/2024  2:39 PM EDT ----- Please schedule 1 week hospital follow up with APP, she will also need a BMP on return Please and thank you Arlyce Berger

## 2024-03-31 NOTE — Telephone Encounter (Signed)
 Called patient and left message for call back.

## 2024-03-31 NOTE — TOC CM/SW Note (Signed)
 Transition of Care [facility]) - Inpatient Brief Assessment   Patient Details  Name: Amanda [name] MRN: [id number] Date of Birth: 10-31-1951  Transition of Care [facility]) CM/SW Contact:    Odilia Bennett, LCSW Phone Number: 03/31/2024, 11:08 AM   Clinical Narrative: CSW reviewed chart. No TOC needs identified at this time. CSW will continue to follow progress. Please place [facility] consult if any needs arise.  Transition of Care Asessment: Insurance and Status: Insurance coverage has been reviewed Patient has primary care physician: Yes Home environment has been reviewed: Single family home Prior level of function:: Modified Independent Prior/Current Home Services: No current home services Social Drivers of Health Review: SDOH reviewed no interventions necessary Readmission risk has been reviewed: Yes Transition of care needs: no transition of care needs at this time

## 2024-04-03 ENCOUNTER — Telehealth: Payer: Self-pay

## 2024-04-03 ENCOUNTER — Telehealth: Payer: Self-pay | Admitting: *Deleted

## 2024-04-03 ENCOUNTER — Telehealth: Payer: Self-pay | Admitting: Cardiovascular Disease

## 2024-04-03 ENCOUNTER — Encounter

## 2024-04-03 LAB — CULTURE, BLOOD (ROUTINE X 2)
Culture: NO GROWTH
Culture: NO GROWTH

## 2024-04-03 NOTE — Telephone Encounter (Signed)
Pt's daughter made aware and verbalized understanding.

## 2024-04-03 NOTE — Progress Notes (Unsigned)
 Cardiology Office Note    Date:  04/05/2024   ID:  Amanda [name], DOB Aug 18, 1951, MRN [id number]  PCP:  Scherrie Curt, MD  Cardiologist:  Antionette Kirks, MD  Electrophysiologist:  None   Chief Complaint: Hospital follow-up  History of Present Illness:   Amanda [name] is a 73 y.o. female with history of nonobstructive CAD by LHC most recently in 09/2023, moderate to severe aortic valve stenosis status post TAVR in 12/2023, diet-controlled diabetes, COPD, HTN, HLD, ongoing tobacco use, mild positional OSA, and mild cognitive impairment followed by neurology who presents for hospital follow-up as outlined below.  Coronary CTA in 08/2018 showed a calcium  score of 521 with moderate nonobstructive disease involving the LAD and RCA that were not significant by ctFFR.  Echo in 08/2019 demonstrated an EF of 60 to 65% with moderate aortic stenosis with a mean gradient of 19 mmHg and a valve area of 1.28 cm.  Repeat echo in 11/2020 showed an EF of 60 to 65%, grade 1 diastolic dysfunction, and moderate aortic stenosis with a mean gradient of 22 mmHg and a valve area of 1.29 cm.  She was admitted to the hospital in 11/2020 with chest pain and worsening exertional dyspnea.  R/LHC showed mild nonobstructive CAD with RHC showing normal filling pressures, normal pulmonary pressure, and normal cardiac output.  Aortic stenosis was moderate with a mean gradient of 24 mmHg and a valve area of 1.44 cm.  Carotid artery ultrasound in 06/2021 showed 1 to 39% bilateral ICA stenoses with antegrade flow of the bilateral vertebral arteries and normal flow hemodynamics of the bilateral subclavian arteries.  She fell in 07/2021 leading to 2 broken ribs.  She was admitted in 11/2021 with right hand cellulitis/abscess due to an infected cat bite.  Echo during that admission demonstrated an EF of 65 to 70%, no regional wall motion abnormalities, mild LVH, normal LV diastolic function parameters, normal RV systolic function  and ventricular cavity size, mild to moderate aortic valve insufficiency, and moderate aortic valve stenosis with a mean gradient of 29.2 mmHg.  She was seen in 07/2022 and forgot to mention at her prior visit that she had 2 episodes of chest discomfort that improved with husband's SL NTG.  She also noted some lower extremity heaviness with ambulation.  Lexiscan  MPI on 07/16/2022 showed no evidence of ischemia or infarction with normal LVEF and was overall low risk.  ABI normal bilaterally in 08/2022.  Echo in 11/2022 showed an EF of 65 to 70%, no regional wall motion abnormalities, mild LVH, grade 1 diastolic dysfunction mild aortic insufficiency, moderate aortic stenosis with a mean gradient of 30 mmHg and a valve area of 1.53 cm, and an estimated right atrial pressure of 8 mmHg.   She underwent MRI of the brain in 01/2023 for evaluation of memory loss, ataxia falls, and gait disturbance.  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  She subsequently followed up with ENT and reported waxing and waning swelling in this area.  Follow-up ultrasound showed a 1.1 cm simple appearing cyst within the right parotid gland.   She was seen in the office in 08/2023 and reported prior dizziness and lightheadedness improved with gait stability physical therapy.  To follow-up on her  aortic valve stenosis/insufficiency she underwent echo on 09/20/2023, that showed an EF of 60 to 65%, no regional wall motion abnormalities, mild LVH, grade 1 diastolic dysfunction, normal RV systolic function and ventricular cavity size, mild mitral regurgitation, severe aortic stenosis with a mean gradient of 36 mmHg and a valve area of 0.7 cm, mild aortic insufficiency, and an estimated right  atrial pressure of 8 mmHg.  When compared to echo from 11/2022, her aortic stenosis has progressed with a prior mean gradient of 30 mmHg and a valve area of 1.53 cm.  She was seen in follow-up on 09/22/2023 and reported some increased shortness of breath with exertion that improved with rest.  Subsequent R/LHC on 10/04/2023 showed mild nonobstructive disease, moderate to severe aortic stenosis with a mean gradient of 32 mmHg and a valve area 1.1 cm, normal right atrial pressure, mildly elevated wedge pressure, minimal pulmonary hypertension, and normal cardiac output.  TAVR evaluation was recommended.  She was evaluated by the structural heart team in 10/2023 and underwent TAVR on 12/21/2023.  Postoperative echo showed preserved LV systolic function with normal functioning TAVR.  Subsequent Zio patch did not show any evidence of high-grade AV block.  PYP scan negative.  Most recent echo from 01/2024 showed an EF of 65 to 70%, no regional wall motion abnormalities, mild concentric LVH, grade 1 diastolic dysfunction, normal RV systolic function and ventricular cavity size, mildly dilated left atrium, trivial mitral regurgitation, and normal structure and function of TAVR with a mean gradient of 6 mmHg.  She was admitted to the hospital on 03/29/2024 through 03/31/2024 with acute hypoxic respiratory failure secondary to COPD exacerbation and diastolic heart failure with admission also notable for demand ischemia in the setting of hypoxia upon presentation.  BNP normal at 39.  She had symptomatic improvement with IV diuresis, nebulizers, and steroids.  She was discharged home on furosemide  40 mg daily.  She comes in accompanied by her daughter today and is without symptoms of angina or cardiac decompensation.  She does continue to note intermittent shortness of breath and oxygen saturations dropping into the low 90s with ambulation.  No palpitations, presyncope, or syncope.  She does note some intermittent  dizziness.  No lower extremity swelling or progressive orthopnea.  When compared to her last visit in our office in 10/2023, her weight is up 4 pounds.  Has been consuming a diet higher in sodium and drinking greater than 2 L of liquid daily.   Labs independently reviewed: 03/2024 - magnesium  2.4, Hgb 12.1, PLT 172, potassium 3.8, BUN 25, serum creatinine 0.85, A1c 6.9, LP(a) less than 8.4, albumin 4.1, AST/ALT normal 01/2023 - A1c 6.4, TSH normal, TC 138, TG 94, HDL 62, LDL 57  Past Medical History:  Diagnosis Date   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 sting    Aortic stenosis    a. 2013: nl LV sys fxn, mild MR, no evidence of pulm htn; b. TTE 8/17: EF 60-65%, no RWMA, nl LV dia fxn,  mild AS, mod AI   CAD (coronary artery disease), native coronary artery 11/26/2020   Controlled type 2 diabetes mellitus with diabetic autonomic neuropathy, without long-term current use of insulin  (HCC) 08/10/2007   COPD (chronic obstructive pulmonary disease) (HCC) 10/30/2016   Depression    GAD (generalized anxiety disorder)    GERD (gastroesophageal reflux disease)    Hiatal hernia with gastroesophageal reflux 1997   Hyperlipidemia    Hypertension    Osteoporosis    S/P TAVR (transcatheter aortic valve replacement)  12/21/2023   s/p TAVR with a 34 mm Evolut FX via the TF approach by Dr. Lorie Rook & Dr. Honey Lusty   Tobacco abuse     Past Surgical History:  Procedure Laterality Date   ABDOMINAL HYSTERECTOMY  1993   ANTERIOR CERVICAL DECOMP/DISCECTOMY FUSION N/A 10/26/2018   Procedure: ACDF C4-C5 C5-C6 C6-C7;  Surgeon: Gearl Keens, MD;  Location: [facility] OR;  Service: Neurosurgery;  Laterality: N/A;   APPENDECTOMY     BACK SURGERY     BREAST BIOPSY Right 2007   benign   CHOLECYSTECTOMY     COLONOSCOPY     30 years ago was normal per pt.    DILATION AND CURETTAGE OF UTERUS     ESOPHAGEAL DILATION     x 3   ESOPHAGOGASTRODUODENOSCOPY (EGD) WITH PROPOFOL  N/A 02/06/2022   Procedure: ESOPHAGOGASTRODUODENOSCOPY (EGD) WITH PROPOFOL ;   Surgeon: Luke Salaam, MD;  Location: [facility] ENDOSCOPY;  Service: Gastroenterology;  Laterality: N/A;   ESOPHAGOGASTRODUODENOSCOPY (EGD) WITH PROPOFOL  N/A 02/26/2022   Procedure: ESOPHAGOGASTRODUODENOSCOPY (EGD) WITH PROPOFOL ;  Surgeon: Luke Salaam, MD;  Location: [facility] ENDOSCOPY;  Service: Gastroenterology;  Laterality: N/A;  EGD+dilation per Dr. Antony Baumgartner   fractured leg Right    INCISION AND DRAINAGE Right 12/02/2021   Procedure: INCISION AND DRAINAGE;  Surgeon: Molli Angelucci, MD;  Location: [facility] ORS;  Service: Orthopedics;  Laterality: Right;   INTRAOPERATIVE TRANSTHORACIC ECHOCARDIOGRAM N/A 12/21/2023   Procedure: INTRAOPERATIVE TRANSTHORACIC ECHOCARDIOGRAM;  Surgeon: Kyra Phy, MD;  Location: [facility] INVASIVE CV LAB;  Service: Cardiovascular;  Laterality: N/A;   LEFT HEART CATH AND CORONARY ANGIOGRAPHY N/A 11/11/2020   Procedure: LEFT HEART CATH AND CORONARY ANGIOGRAPHY;  Surgeon: Wenona Hamilton, MD;  Location: [facility] INVASIVE CV LAB;  Service: Cardiovascular;  Laterality: N/A;   OOPHORECTOMY     OVARIAN CYST REMOVAL  1972   RIGHT/LEFT HEART CATH AND CORONARY ANGIOGRAPHY Bilateral 10/04/2023   Procedure: RIGHT/LEFT HEART CATH AND CORONARY ANGIOGRAPHY;  Surgeon: Wenona Hamilton, MD;  Location: [facility] INVASIVE CV LAB;  Service: Cardiovascular;  Laterality: Bilateral;   SALIVARY GLAND SURGERY     TONSILLECTOMY     4 yoa   TOTAL KNEE ARTHROPLASTY Left 12/24/2015   Procedure: LEFT TOTAL KNEE ARTHROPLASTY;  Surgeon: Jasmine Mesi, MD;  Location: [facility] OR;  Service: Orthopedics;  Laterality: Left;   TOTAL KNEE ARTHROPLASTY Right 12/30/2017   Procedure: RIGHT TOTAL KNEE ARTHROPLASTY;  Surgeon: Jasmine Mesi, MD;  Location: [facility] OR;  Service: Orthopedics;  Laterality: Right;   WRIST FRACTURE SURGERY  11/2008    Current Medications: Current Meds  Medication Sig   acetaminophen  (TYLENOL ) 500 MG tablet Take 1,000 mg by mouth every 8 (eight) hours as needed for mild pain (pain score 1-3) or moderate  pain (pain score 4-6).   albuterol  (VENTOLIN  HFA) 108 (90 Base) MCG/ACT inhaler Inhale 2 puffs into the lungs every 4 (four) hours as needed for wheezing or shortness of breath.   amitriptyline  (ELAVIL ) 50 MG tablet Take 0.5 tablets (25 mg total) by mouth at bedtime.   Ascorbic Acid  (VITAMIN C ) 1000 MG tablet Take 1,000 mg by mouth daily.   aspirin  EC 81 MG tablet Take 81 mg by mouth daily.   budesonide  (PULMICORT ) 0.5 MG/2ML nebulizer solution Take 2 mLs (0.5 mg total) by nebulization 2 (two) times daily.   buPROPion  (WELLBUTRIN  XL) 150 MG 24 hr tablet Take 1 tablet (150 mg total) by mouth daily.   calcium  carbonate (OS-CAL - DOSED IN MG OF ELEMENTAL CALCIUM ) 1250 (500 Ca) MG  tablet Take 1 tablet by mouth daily at 6 (six) AM.   calcium  carbonate (TUMS - DOSED IN MG ELEMENTAL CALCIUM ) 500 MG chewable tablet Chew 2 tablets by mouth daily as needed for indigestion or heartburn.   Cholecalciferol  (VITAMIN D ) 1000 UNITS capsule Take 2,000 Units by mouth daily.   clonazePAM  (KLONOPIN ) 0.5 MG tablet Take 1 tablet by mouth twice daily as needed   cyanocobalamin  (VITAMIN B12) 1000 MCG/ML injection Inject into the muscle.   cyclobenzaprine  (FLEXERIL ) 10 MG tablet Take 1 tablet by mouth three times daily as needed for muscle spasm   dimenhyDRINATE (DRAMAMINE) 50 MG tablet Take 50 mg by mouth every 8 (eight) hours as needed for nausea or dizziness.   donepezil  (ARICEPT ) 5 MG tablet TAKE 1 TABLET BY MOUTH AT BEDTIME   empagliflozin  (JARDIANCE ) 10 MG TABS tablet Take 1 tablet (10 mg total) by mouth daily.   ezetimibe  (ZETIA ) 10 MG tablet Take 1 tablet by mouth once daily   FLUoxetine  (PROZAC ) 40 MG capsule Take 80 mg by mouth M, W, F and 40 mg by mouth Tues, Thurs, Sat and Sunday   fluticasone  (FLONASE) 50 MCG/ACT nasal spray Place 2 sprays into both nostrils daily as needed for rhinitis.   furosemide  (LASIX ) 40 MG tablet Take 1 tablet (40 mg total) by mouth daily.   gabapentin  (NEURONTIN ) 300 MG capsule Take  300 mg by mouth daily.   guaiFENesin  (MUCINEX ) 600 MG 12 hr tablet Take 600 mg by mouth 2 (two) times daily as needed for to loosen phlegm.   ipratropium-albuterol  (DUONEB) 0.5-2.5 (3) MG/3ML SOLN Take 3 mLs by nebulization every 6 (six) hours as needed.   magnesium  oxide (MAG-OX) 400 (240 Mg) MG tablet Take 400 mg by mouth daily.   metoprolol  tartrate (LOPRESSOR ) 25 MG tablet TAKE 1/2 TABLET TWICE DAILY   Multiple Vitamin (MULTIVITAMIN) tablet Take 1 tablet by mouth daily.   naproxen  sodium (ALEVE ) 220 MG tablet Take 220 mg by mouth in the morning.   pantoprazole  (PROTONIX ) 40 MG tablet TAKE 1 TABLET 30 MINUTES PRIOR TO BREAKFAST   rosuvastatin  (CRESTOR ) 40 MG tablet Take 1 tablet by mouth once daily   Simethicone  125 MG CAPS Take 125 mg by mouth daily as needed (gas).   spironolactone  (ALDACTONE ) 25 MG tablet Take 1 tablet (25 mg total) by mouth daily.   vitamin E 180 MG (400 UNITS) capsule Take 400 Units by mouth daily.    Allergies:   Honey bee venom [bee venom] and Diazepam   Social History   Socioeconomic History   Marital status: Married    Spouse name: Not on file   Number of children: 2   Years of education: Not on file   Highest education level: Some college, no degree  Occupational History   Occupation: Insurance claims handler: [employer]  Tobacco Use   Smoking status: Former    Average packs/day: 0.8 packs/day for 45.0 years (33.8 ttl pk-yrs)    Types: Cigarettes    Start date: 01/2023   Smokeless tobacco: Never   Tobacco comments:    Does not on starting back smoking.  Vaping Use   Vaping status: Never Used  Substance and Sexual Activity   Alcohol use: Yes    Alcohol/week: 0.0 standard drinks of alcohol    Comment: glass of wine twice a month    Drug use: No   Sexual activity: Not Currently  Other Topics Concern   Not on file  Social  History Narrative   Not on file   Social Drivers of Health   Financial Resource  Strain: High Risk (02/21/2024)   Overall Financial Resource Strain (CARDIA)    Difficulty of Paying Living Expenses: Hard  Food Insecurity: No Food Insecurity (03/29/2024)   Hunger Vital Sign    Worried About Running Out of Food in the Last Year: Never true    Ran Out of Food in the Last Year: Never true  Recent Concern: Food Insecurity - Food Insecurity Present (02/21/2024)   Hunger Vital Sign    Worried About Running Out of Food in the Last Year: Sometimes true    Ran Out of Food in the Last Year: Never true  Transportation Needs: No Transportation Needs (03/29/2024)   PRAPARE - Administrator, Civil Service (Medical): No    Lack of Transportation (Non-Medical): No  Physical Activity: Unknown (02/21/2024)   Exercise Vital Sign    Days of Exercise per Week: 0 days    Minutes of Exercise per Session: Not on file  Stress: Stress Concern Present (02/21/2024)   [employer] of Occupational Health - Occupational Stress Questionnaire    Feeling of Stress : Very much  Social Connections: Moderately Isolated (03/29/2024)   Social Connection and Isolation Panel [NHANES]    Frequency of Communication with Friends and Family: Twice a week    Frequency of Social Gatherings with Friends and Family: Once a week    Attends Religious Services: 1 to 4 times per year    Active Member of [employer] or Organizations: No    Attends Banker Meetings: Patient declined    Marital Status: Widowed     Family History:  The patient's family history includes Arthritis in her mother; Breast cancer in her sister; Cancer in her paternal grandmother; Colon polyps in her mother and sister; Diabetes in her mother; Heart disease in her mother; Hypercholesterolemia in her sister; Hyperlipidemia in her mother. There is no history of Colon cancer, Esophageal cancer, Rectal cancer, Stomach cancer, or Pancreatic cancer.  ROS:   12-point review of systems is negative unless otherwise noted in the  HPI.   EKGs/Labs/Other Studies Reviewed:    Studies reviewed were summarized above. The additional studies were reviewed today:  2D echo 01/21/2024: 1. Left ventricular ejection fraction, by estimation, is 65 to 70%. Left  ventricular ejection fraction by 3D volume is 60 %. The left ventricle has  normal function. The left ventricle has no regional wall motion  abnormalities. There is mild concentric  left ventricular hypertrophy. Left ventricular diastolic parameters are  consistent with Grade I diastolic dysfunction (impaired relaxation). The  average left ventricular global longitudinal strain is -19.2 %. The global  longitudinal strain is normal.   2. Right ventricular systolic function is normal. The right ventricular  size is normal.   3. Left atrial size was mildly dilated.   4. The mitral valve is normal in structure. Trivial mitral valve  regurgitation. No evidence of mitral stenosis.   5. The aortic valve is normal in structure. Aortic valve regurgitation is  not visualized. No aortic stenosis is present. There is a 34 Medtronic  CoreValve-Evolut Pro prosthetic (TAVR) valve present in the aortic  position. Procedure Date: 12/21/2023.  Aortic valve area, by VTI measures 3.19 cm. Aortic valve mean gradient  measures 6.0 mmHg. Aortic valve Vmax measures 1.74 m/s.   6. The inferior vena cava is normal in size with greater than 50%  respiratory variability, suggesting right  atrial pressure of 3 mmHg.  __________  Zio patch 12/2023: Patient had a min HR of 56 bpm, max HR of 129 bpm, and avg HR of 72 bpm. Predominant underlying rhythm was Sinus Rhythm. Bundle Branch Block/IVCD was present. 1 run of Supraventricular Tachycardia occurred lasting 4 beats with a max rate of 129 bpm (avg  107 bpm).  Rare PACs and rare PVCs. No significant bradycardia overall. __________  Myocardial PYP scan 01/14/2024:   Myocardial uptake was negative for radiotracer uptake. The visual grade of  myocardial uptake relative to the ribs was Grade 0 (No myocardial uptake and normal bone uptake).   Findings are not suggestive (Grade 0) of cardiac ATTR amyloidosis.   Prior study not available for comparison. __________  2D echo 12/22/2023: 1. Left ventricular ejection fraction, by estimation, is 70 to 75%. The  left ventricle has hyperdynamic function. The left ventricle has no  regional wall motion abnormalities. There is mild concentric left  ventricular hypertrophy. Left ventricular  diastolic parameters are consistent with Grade I diastolic dysfunction  (impaired relaxation).   2. Right ventricular systolic function is normal. The right ventricular  size is normal. Tricuspid regurgitation signal is inadequate for assessing  PA pressure.   3. The mitral valve is degenerative. Trivial mitral valve regurgitation.  Moderate mitral annular calcification.   4. The aortic valve has been repaired/replaced. Aortic valve  regurgitation is not visualized. There is a 34mm Medtronic  CoreValve-Evolut Pro prosthetic (TAVR) valve present in the aortic  position. Procedure Date: 12/21/23. Echo findings are consistent  with normal structure and function of the aortic valve prosthesis. Aortic  valve area, by VTI measures 3.10 cm. Aortic valve mean gradient measures  8.0 mmHg. Aortic valve Vmax measures 1.89 m/s.   5. The inferior vena cava is normal in size with greater than 50%  respiratory variability, suggesting right atrial pressure of 3 mmHg.  __________  Limited echo 12/21/2023: 1. Left ventricular ejection fraction, by estimation, is 60 to 65%. The  left ventricle has normal function. The left ventricle has no regional  wall motion abnormalities. Left ventricular diastolic parameters are  consistent with Grade I diastolic  dysfunction (impaired relaxation).   2. Right ventricular systolic function is normal. The right ventricular  size is normal.   3. The mitral valve is degenerative.  No evidence of mitral valve  regurgitation. Moderate mitral annular calcification.   4. TAVR Pre procedure: mean gradient 30 mmHg. Peak gradient 45 mm Hg. DVI  .34. AVA 1.44cm2. Mild to moderate central AI      Post deployment: mean gradient 2 mmHg. Peak gradient 4 mm Hg DVI 0.65.  EOA 3.12cm2 with trivial PVL. Aortic valve regurgitation is mild to  moderate. There is a 34 mm Medtronic stented (TAVR) valve present in the  aortic position.   Comparison(s): Successful TAVR Placement.  __________  Carotid artery ultrasound 10/28/2023: Summary:  Right Carotid: Velocities in the right ICA are consistent with a 1-39% stenosis.   Left Carotid: Velocities in the left ICA are consistent with a 1-39% stenosis.   Vertebrals: Bilateral vertebral arteries demonstrate antegrade flow.  Subclavians: Right subclavian artery flow was disturbed. Normal flow hemodynamics were seen in the left subclavian artery.  __________  [facility] 10/04/2023: 1.  Mild nonobstructive coronary artery disease. 2.  Moderate to severe aortic stenosis with mean gradient of 32 mmHg and valve area of 1.1 cm. 3.  Right heart catheterization showed normal RA pressure, mildly elevated wedge pressure, minimal pulmonary hypertension and  normal cardiac output.   Recommendations: Proceed with TAVR evaluation. __________   2D echo 09/20/2023: 1. Left ventricular ejection fraction, by estimation, is 60 to 65%. The  left ventricle has normal function. The left ventricle has no regional  wall motion abnormalities. There is mild left ventricular hypertrophy.  Left ventricular diastolic parameters  are consistent with Grade I diastolic dysfunction (impaired relaxation).   2. Right ventricular systolic function is normal. The right ventricular  size is normal.   3. The mitral valve is degenerative. Mild mitral valve regurgitation.   4. AV mean gradient , PG , DVI 0.24, AVA 0.7cm2. The aortic  valve is calcified. Aortic  valve regurgitation is mild. Severe aortic  valve stenosis.   5. The inferior vena cava is dilated in size with >50% respiratory  variability, suggesting right atrial pressure of 8 mmHg.  __________   2D echo 11/11/2022: 1. Left ventricular ejection fraction, by estimation, is 65 to 70%. The  left ventricle has normal function. The left ventricle has no regional  wall motion abnormalities. There is mild left ventricular hypertrophy.  Left ventricular diastolic parameters  are consistent with Grade I diastolic dysfunction (impaired relaxation).   2. Right ventricular systolic function is normal. The right ventricular  size is normal.   3. The mitral valve is degenerative. No evidence of mitral valve  regurgitation.   4. Mean aortic valve gradient , peak gradient , Vmax 3.35m/s,  DVI 0.37.Aaron Aas The aortic valve is calcified. Aortic valve regurgitation is  mild. Moderate aortic valve stenosis. Aortic valve area, by VTI measures  1.53 cm.   5. The inferior vena cava is dilated in size with >50% respiratory  variability, suggesting right atrial pressure of 8 mmHg.  ___________   2D echo 12/02/2021: 1. Left ventricular ejection fraction, by estimation, is 65 to 70%. The  left ventricle has normal function. The left ventricle has no regional  wall motion abnormalities. There is mild left ventricular hypertrophy.  Left ventricular diastolic parameters  were normal.   2. Right ventricular systolic function is normal. The right ventricular  size is normal.   3. The mitral valve is degenerative. No evidence of mitral valve  regurgitation.   4. The aortic valve is calcified. Aortic valve regurgitation is mild to  moderate. Moderate aortic valve stenosis. Aortic valve mean gradient  measures 29.2 mmHg. Aortic valve Vmax measures 3.35 m/s.   5. The inferior vena cava is normal in size with greater than 50%  respiratory variability, suggesting right atrial pressure of 3  mmHg __________   Carotid artery ultrasound 07/04/2021: Summary:  Right Carotid: Velocities in the right ICA are consistent with a 1-39%  stenosis. Non-hemodynamically significant plaque <50% noted in the  CCA. The ECA appears <50% stenosed.   Left Carotid: Velocities in the left ICA are consistent with a 1-39%  stenosis. Non-hemodynamically significant plaque <50% noted in the CCA. The ECA appears <50% stenosed.   Vertebrals:  Bilateral vertebral arteries demonstrate antegrade flow.  Subclavians: Normal flow hemodynamics were seen in bilateral subclavian arteries. __________   [facility] 11/11/2020: 1.  Mild nonobstructive coronary artery disease.  Tortuous vessels overall suggestive of chronic hypertension. 2.  Left ventricular angiography was not performed.  EF was normal by echo. 3.  Right heart catheterization showed normal filling pressures, normal pulmonary pressure and normal cardiac output. 4.  Moderate aortic stenosis with mean gradient of 24 mmHg and valve area of 1.44 cm.   Recommendations: No culprit is identified for the patient's  chest pain.  Recommend medical therapy for nonobstructive coronary artery disease. Continue to monitor aortic stenosis with echocardiograms. The patient can be discharged home from a cardiac standpoint.  No other issues. __________   2D echo 12/2/201.  1. Left ventricular ejection fraction, by estimation, is 60 to 65%. The  left ventricle has normal function. The left ventricle has no regional  wall motion abnormalities. There is mild to moderate left ventricular  hypertrophy. Left ventricular diastolic  parameters are consistent with Grade I diastolic dysfunction (impaired  relaxation). The average left ventricular global longitudinal strain is  -15.3 %. The global longitudinal strain is abnormal.   2. Right ventricular systolic function is normal. The right ventricular  size is normal.   3. Left atrial size was mildly dilated.   4. The aortic  valve is moderately calcified. Aortic valve regurgitation  is mild. Moderate aortic valve stenosis. Aortic valve area, by VTI  measures 1.29 cm. Aortic valve mean gradient measures 22.2 mmHg. Aortic  valve Vmax measures 3.34 m/s. __________   2D echo 08/23/2019: 1. The left ventricle has normal systolic function with an ejection  fraction of 60-65%. The cavity size was normal. There is mild concentric  left ventricular hypertrophy. Left ventricular diastolic Doppler  parameters are consistent with impaired  relaxation.   2. The right ventricle has normal systolic function. The cavity was  normal. There is No increase in right ventricular wall thickness.   3. Left atrial size was normal.   4. Right atrial size was normal.   5. The mitral valve is abnormal. mild calcification of the mitral valve  leaflet.   6. The aortic valve is abnormal. Moderate sclerosis of the aortic valve.  Aortic valve regurgitation is mild by color flow Doppler. Mild-moderate  stenosis of the aortic valve. DVI 0.36   7. The aorta is normal unless otherwise noted.   8. The pulmonic valve was not well visualized. Pulmonic valve  regurgitation was not assessed by color flow Doppler. __________   Coronary CTA 10/05/2018: FINDINGS: Non-cardiac: See separate report from [facility]. No significant findings on limited lung and soft tissue windows.   Calcium  score: Dense calcium  noted in LAD and RCA   Coronary Arteries: Right dominant with no anomalies   LM: Distal LM less than 50% calcified stenosis   LAD: Ostial and mid LAD less than 50% calcific stenosis   D1: Small vessel less than 30% mixed plaque   D2: Large branching vessel 50% calcific plaque proximally   Circumflex: Less than 50% calcified plaque at ostium   OM1: Small branch without significant disease   RCA: 50% or less calcific plaque in proximal mid and distal vessel   PDA: Less than 30% calcific plaque   PLA: Less than 30%  calcific plaque   IMPRESSION: 1. Calcific aortic atherosclerosis moderate Normal aortic root 2.9 cm 2.  Calcified Aortic Valve suggest TTE correlation for AS 3.  Calcium  score 521 which is 94 th percentile for age and sex 4. Moderate non obstructive appearing disease primarily in RCA and LAD as well as D2 Study will be sent for FFR CT __________   2D echo 08/17/2018: - Left ventricle: The cavity size was at the upper limits of    normal. Wall thickness was increased in a pattern of mild LVH.    Systolic function was normal. The estimated ejection fraction was    in the range of 60% to 65%. Wall motion was normal; there were no  regional wall motion abnormalities. Doppler parameters are    consistent with abnormal left ventricular relaxation (grade 1    diastolic dysfunction).  - Aortic valve: Transvalvular velocity was increased. There was    mild to moderate stenosis. There was mild to moderate    regurgitation. Mean gradient (S): 19 mm Hg. Valve area (VTI):    1.28 cm^2.  - Mitral valve: Calcified annulus.  - Left atrium: The atrium was mildly dilated.  - Right ventricle: The cavity size was normal. Systolic function    was normal. ___________   2D echo 08/05/2016: - Left ventricle: The cavity size was normal. There was mild focal    basal hypertrophy of the septum. Systolic function was normal.    The estimated ejection fraction was in the range of 60% to 65%.    Wall motion was normal; there were no regional wall motion    abnormalities. Left ventricular diastolic function parameters    were normal for the patient&'s age.  - Aortic valve: Calcified annulus. Probably trileaflet. There was    mild stenosis, based on peak velocity and mean gradient. There    was moderate regurgitation. __________   2D echo 03/17/2012: - Left ventricle: The cavity size was normal. There was mild    to moderate concentric hypertrophy. Systolic function was    normal. The estimated ejection  fraction was in the range    of 55% to 60%. Wall motion was normal; there were no    regional wall motion abnormalities. Doppler parameters are    consistent with abnormal left ventricular relaxation    (grade 1 diastolic dysfunction).  - Aortic valve: Mild regurgitation.  - Mitral valve: Mild regurgitation.  - Right ventricle: Systolic function was normal.  - Pulmonary arteries: Systolic pressure was within the    normal range.   EKG:  EKG is not ordered today.    Recent Labs: 03/29/2024: ALT 14; B Natriuretic Peptide 39.2 03/31/2024: Hemoglobin 12.1; Magnesium  2.4; Platelets 172 04/05/2024: BUN 12; Creatinine, Ser 1.13; Potassium 4.4; Sodium 134  Recent Lipid Panel    Component Value Date/Time   CHOL 138 01/28/2023 1158   CHOL 201 (H) 09/18/2013 1016   TRIG 94.0 01/28/2023 1158   HDL 62.40 01/28/2023 1158   HDL 53 09/18/2013 1016   CHOLHDL 2 01/28/2023 1158   VLDL 18.8 01/28/2023 1158   LDLCALC 57 01/28/2023 1158   LDLCALC 130 (H) 09/18/2013 1016   LDLDIRECT 209.2 05/28/2009 0909    PHYSICAL EXAM:    VS:  BP 124/70 (BP Location: Left Arm)   Pulse (!) 104   Ht 5\' 7"  (1.702 m)   Wt 229 lb (103.9 kg)   SpO2 97%   BMI 35.87 kg/m   BMI: Body mass index is 35.87 kg/m.  Physical Exam Vitals reviewed.  Constitutional:      Appearance: She is well-developed.  HENT:     Head: Normocephalic and atraumatic.  Eyes:     General:        Right eye: No discharge.        Left eye: No discharge.  Neck:     Vascular: No JVD.  Cardiovascular:     Rate and Rhythm: Normal rate and regular rhythm.     Heart sounds: Normal heart sounds, S1 normal and S2 normal. Heart sounds not distant. No midsystolic click and no opening snap. No murmur heard.    No friction rub.  Pulmonary:     Effort: Pulmonary effort is normal. No  respiratory distress.     Breath sounds: Normal breath sounds. No decreased breath sounds, wheezing, rhonchi or rales.  Chest:     Chest wall: No tenderness.   Musculoskeletal:     Cervical back: Normal range of motion.     Right lower leg: No edema.     Left lower leg: No edema.  Skin:    General: Skin is warm and dry.     Nails: There is no clubbing.  Neurological:     Mental Status: She is alert and oriented to person, place, and time.  Psychiatric:        Speech: Speech normal.        Behavior: Behavior normal.        Thought Content: Thought content normal.        Judgment: Judgment normal.     Wt Readings from Last 3 Encounters:  04/05/24 229 lb (103.9 kg)  04/05/24 227 lb (103 kg)  03/29/24 234 lb 9.1 oz (106.4 kg)     ASSESSMENT & PLAN:   History of acute hypoxic respiratory distress: Likely multifactorial including COPD exacerbation and volume overload.  Symptoms improved.  Update echo to evaluate for new structural changes.  For now remains on furosemide  40 mg daily along with Jardiance  10 mg.  Check BMP.  Nonobstructive CAD with demand ischemia: Recent LHC with minimal nonobstructive CAD in 09/2023.  Mildly elevated high-sensitivity troponin felt to be reflective of supply/demand ischemia in the setting of acute hypoxic respiratory failure secondary to COPD exacerbation and volume overload.  Continue current pharmacotherapy including aspirin  81 mg and rosuvastatin  40 mg.  Moderate to severe aortic stenosis: Status post TAVR.  Edentulous and does not go to the dentist.  Remains on aspirin  81 mg.  HLD: LDL 57 in 01/2023.  Remains on ezetimibe  and rosuvastatin .  Check lipid panel.  Carotid artery stenosis: Ultrasound from 10/2023 showed 1 to 39% bilateral carotid artery stenosis that was stable since 2022.  Remains on aspirin  and statin as above.  COPD: Contributory to recent admission.  She reports exertional hypoxia.  Referred to pulmonology to establish care.    Disposition: F/u with Dr. Alvenia Aus or an APP in 3 months.   Medication Adjustments/Labs and Tests Ordered: Current medicines are reviewed at length with the  patient today.  Concerns regarding medicines are outlined above. Medication changes, Labs and Tests ordered today are summarized above and listed in the Patient Instructions accessible in Encounters.   Signed, Varney Gentleman, PA-C 04/05/2024 4:53 PM      [facility] [place] [address] [place], [place] [address] [phone]

## 2024-04-03 NOTE — Telephone Encounter (Signed)
 She can resume heart track this week.

## 2024-04-03 NOTE — Telephone Encounter (Signed)
Attempted to contact pt's daughter Unable to leave message as mailbox is not set up 

## 2024-04-03 NOTE — Transitions of Care (Post Inpatient/ED Visit) (Addendum)
   04/03/2024  Name: Amanda [name] MRN: [id number] DOB: 26-Jun-1951  Today's TOC FU Call Status: Today's TOC FU Call Status:: Unsuccessful Call (1st Attempt) Unsuccessful Call (1st Attempt) Date: 04/03/24  Attempted to reach the patient regarding the most recent Inpatient/ED visit. Left a HIPAA appropriate voice mail message to phone number provided.  Follow Up Plan: Additional outreach attempts will be made to reach the patient to complete the Transitions of Care (Post Inpatient/ED visit) call.   Brown Cape, RN, BSN, CCM [facility], [facility] RN Care Manager Direct Dial: [phone]

## 2024-04-03 NOTE — Addendum Note (Signed)
 Addended by: Chapman Commodore on: 04/03/2024 08:46 AM   Modules accepted: Orders

## 2024-04-03 NOTE — Telephone Encounter (Signed)
 Daughter Christin Coy) stated patient was released from the hospital on 4/25 and wants to know when patient can re-start Heart Track.

## 2024-04-03 NOTE — Telephone Encounter (Signed)
 Nneka's daughter called to inform us  that Laciana has been in the hospital and has been discharged but has not yet received clearance to return to cardiac rehab. She will continue to update us  if she has to miss any other appointments.

## 2024-04-04 ENCOUNTER — Telehealth: Payer: Self-pay

## 2024-04-04 NOTE — Progress Notes (Unsigned)
     Amanda Mangano T. Melitza Metheny, MD, CAQ Sports Medicine [facility] [address] [place], [address]  Phone: [phone]  FAX: [phone]  Amanda [name] - 73 y.o. female  MRN [id number]  Date of Birth: 1951-08-01  Date: 04/05/2024  PCP: Scherrie Curt, MD  Referral: Scherrie Curt, MD  No chief complaint on file.  Subjective:   Amanda [name] is a 73 y.o. very pleasant female patient with There is no height or weight on file to calculate BMI. who presents with the following:  Date of admission: 03/29/2024             Date of discharge:  03/31/2024        Is a very well-known patient for many years.  She was admitted on the above date with COPD exacerbation as well as acute respiratory failure and hypoxia.  She was hypoxic at time of admission, she was placed on CPAP by EMS and transition to BiPAP on arrival.  She was weaned off BiPAP and was given nebulized bronchodilators.  She also received IV steroids.  Discharged on Advair .  Cardiology also felt that the patient was in part having respiratory failure due to pulmonary edema from diastolic heart failure.  She was discharged on 40 mg of Lasix  a day, they started Aldactone  25 mg a day.  She also had an elevated troponin which they felt was due to demand ischemia.    Review of Systems is noted in the HPI, as appropriate  Objective:   There were no vitals taken for this visit.  GEN: No acute distress; alert,appropriate. PULM: Breathing comfortably in no respiratory distress PSYCH: Normally interactive.   Laboratory and Imaging Data:  Assessment and Plan:   ***

## 2024-04-04 NOTE — Transitions of Care (Post Inpatient/ED Visit) (Signed)
   04/04/2024  Name: Amanda [name] MRN: [id number] DOB: September 13, 1951  Today's TOC FU Call Status: Today's TOC FU Call Status:: Unsuccessful Call (2nd Attempt) Unsuccessful Call (2nd Attempt) Date: 04/04/24  Attempted to reach the patient regarding the most recent Inpatient/ED visit. Left a voice mail message requesting a return call.  Follow Up Plan: Additional outreach attempts will be made to reach the patient to complete the Transitions of Care (Post Inpatient/ED visit) call.   Brown Cape, RN, BSN, CCM [facility], [facility] RN Care Manager Direct Dial: [phone]

## 2024-04-05 ENCOUNTER — Ambulatory Visit: Admitting: Family Medicine

## 2024-04-05 ENCOUNTER — Ambulatory Visit: Payer: Medicare Other | Attending: Physician Assistant | Admitting: Physician Assistant

## 2024-04-05 ENCOUNTER — Encounter

## 2024-04-05 ENCOUNTER — Encounter: Payer: Self-pay | Admitting: Family Medicine

## 2024-04-05 ENCOUNTER — Encounter: Payer: Self-pay | Admitting: Physician Assistant

## 2024-04-05 VITALS — BP 124/70 | HR 104 | Ht 67.0 in | Wt 229.0 lb

## 2024-04-05 VITALS — BP 118/72 | HR 97 | Temp 98.0°F | Ht 65.0 in | Wt 227.0 lb

## 2024-04-05 DIAGNOSIS — J9621 Acute and chronic respiratory failure with hypoxia: Secondary | ICD-10-CM | POA: Diagnosis not present

## 2024-04-05 DIAGNOSIS — I2510 Atherosclerotic heart disease of native coronary artery without angina pectoris: Secondary | ICD-10-CM | POA: Diagnosis not present

## 2024-04-05 DIAGNOSIS — J9622 Acute and chronic respiratory failure with hypercapnia: Secondary | ICD-10-CM | POA: Diagnosis not present

## 2024-04-05 DIAGNOSIS — J449 Chronic obstructive pulmonary disease, unspecified: Secondary | ICD-10-CM

## 2024-04-05 DIAGNOSIS — J431 Panlobular emphysema: Secondary | ICD-10-CM | POA: Diagnosis not present

## 2024-04-05 DIAGNOSIS — Z952 Presence of prosthetic heart valve: Secondary | ICD-10-CM

## 2024-04-05 DIAGNOSIS — E785 Hyperlipidemia, unspecified: Secondary | ICD-10-CM

## 2024-04-05 DIAGNOSIS — I350 Nonrheumatic aortic (valve) stenosis: Secondary | ICD-10-CM

## 2024-04-05 DIAGNOSIS — Z79899 Other long term (current) drug therapy: Secondary | ICD-10-CM

## 2024-04-05 DIAGNOSIS — R7989 Other specified abnormal findings of blood chemistry: Secondary | ICD-10-CM

## 2024-04-05 DIAGNOSIS — R0602 Shortness of breath: Secondary | ICD-10-CM

## 2024-04-05 DIAGNOSIS — Z87898 Personal history of other specified conditions: Secondary | ICD-10-CM | POA: Diagnosis not present

## 2024-04-05 DIAGNOSIS — I5033 Acute on chronic diastolic (congestive) heart failure: Secondary | ICD-10-CM

## 2024-04-05 DIAGNOSIS — I6523 Occlusion and stenosis of bilateral carotid arteries: Secondary | ICD-10-CM | POA: Diagnosis not present

## 2024-04-05 DIAGNOSIS — J441 Chronic obstructive pulmonary disease with (acute) exacerbation: Secondary | ICD-10-CM | POA: Diagnosis not present

## 2024-04-05 DIAGNOSIS — I2489 Other forms of acute ischemic heart disease: Secondary | ICD-10-CM | POA: Diagnosis not present

## 2024-04-05 LAB — BASIC METABOLIC PANEL WITH GFR
BUN: 12 mg/dL (ref 6–23)
CO2: 29 meq/L (ref 19–32)
Calcium: 9.8 mg/dL (ref 8.4–10.5)
Chloride: 97 meq/L (ref 96–112)
Creatinine, Ser: 1.13 mg/dL (ref 0.40–1.20)
GFR: 48.14 mL/min — ABNORMAL LOW (ref >60.00)
Glucose, Bld: 208 mg/dL — ABNORMAL HIGH (ref 70–99)
Potassium: 4.4 meq/L (ref 3.5–5.1)
Sodium: 134 meq/L — ABNORMAL LOW (ref 135–145)

## 2024-04-05 MED ORDER — IPRATROPIUM-ALBUTEROL 0.5-2.5 (3) MG/3ML IN SOLN
3.0000 mL | Freq: Four times a day (QID) | RESPIRATORY_TRACT | 3 refills | Status: AC | PRN
Start: 2024-04-05 — End: ?

## 2024-04-05 MED ORDER — BUDESONIDE 0.5 MG/2ML IN SUSP
0.5000 mg | Freq: Two times a day (BID) | RESPIRATORY_TRACT | 3 refills | Status: AC
Start: 2024-04-05 — End: ?

## 2024-04-05 NOTE — Patient Instructions (Signed)
 Medication Instructions:  Your Physician recommend you continue on your current medication as directed.    *If you need a refill on your cardiac medications before your next appointment, please call your pharmacy*  Lab Work:  BMet, Lipid  If you have labs (blood work) drawn today and your tests are completely normal, you will receive your results only by: MyChart Message (if you have MyChart) OR A paper copy in the mail If you have any lab test that is abnormal or we need to change your treatment, we will call you to review the results.  Testing/Procedures: Your physician has requested that you have an echocardiogram. Echocardiography is a painless test that uses sound waves to create images of your heart. It provides your doctor with information about the size and shape of your heart and how well your heart's chambers and valves are working.   You may receive an ultrasound enhancing agent through an IV if needed to better visualize your heart during the echo. This procedure takes approximately one hour.  There are no restrictions for this procedure.  This will take place at [address] [facility], [place] [address]  Please note: We ask at that you not bring children with you during ultrasound (echo/ vascular) testing. Due to room size and safety concerns, children are not allowed in the ultrasound rooms during exams. Our front office staff cannot provide observation of children in our lobby area while testing is being conducted. An adult accompanying a patient to their appointment will only be allowed in the ultrasound room at the discretion of the ultrasound technician under special circumstances. We apologize for any inconvenience.   Follow-Up: At [facility], you and your health needs are our priority.  As part of our continuing mission to provide you with exceptional heart care, our providers are all part of one team.  This team includes your primary  Cardiologist (physician) and Advanced Practice Providers or APPs (Physician Assistants and Nurse Practitioners) who all work together to provide you with the care you need, when you need it.  Your next appointment:   3 month(s)  Provider:   You may see Antionette Kirks, MD or one of the following Advanced Practice Providers on your designated Care Team:   Laneta Pintos, NP Gildardo Labrador, PA-C Varney Gentleman, PA-C Cadence Chualar, PA-C Ronald Cockayne, NP Morey Ar, NP    We recommend signing up for the patient portal called "MyChart".  Sign up information is provided on this After Visit Summary.  MyChart is used to connect with patients for Virtual Visits (Telemedicine).  Patients are able to view lab/test results, encounter notes, upcoming appointments, etc.  Non-urgent messages can be sent to your provider as well.   To learn more about what you can do with MyChart, go to [email or web address].   Other Instructions  Referred to Dr Auston Left of Pulmonary Clinic

## 2024-04-06 ENCOUNTER — Telehealth: Payer: Self-pay

## 2024-04-06 ENCOUNTER — Other Ambulatory Visit: Payer: Self-pay | Admitting: Emergency Medicine

## 2024-04-06 ENCOUNTER — Encounter

## 2024-04-06 DIAGNOSIS — Z79899 Other long term (current) drug therapy: Secondary | ICD-10-CM

## 2024-04-06 LAB — LIPID PANEL
Chol/HDL Ratio: 2.4 ratio (ref 0.0–4.4)
Cholesterol, Total: 141 mg/dL (ref 100–199)
HDL: 60 mg/dL (ref >39)
LDL Chol Calc (NIH): 48 mg/dL (ref 0–99)
Triglycerides: 211 mg/dL — ABNORMAL HIGH (ref 0–149)
VLDL Cholesterol Cal: 33 mg/dL (ref 5–40)

## 2024-04-06 LAB — BASIC METABOLIC PANEL WITH GFR
BUN/Creatinine Ratio: 10 — ABNORMAL LOW (ref 12–28)
BUN: 14 mg/dL (ref 8–27)
CO2: 20 mmol/L (ref 20–29)
Calcium: 9.7 mg/dL (ref 8.7–10.3)
Chloride: 98 mmol/L (ref 96–106)
Creatinine, Ser: 1.35 mg/dL — ABNORMAL HIGH (ref 0.57–1.00)
Glucose: 262 mg/dL — ABNORMAL HIGH (ref 70–99)
Potassium: 4.4 mmol/L (ref 3.5–5.2)
Sodium: 134 mmol/L (ref 134–144)
eGFR: 41 mL/min/{1.73_m2} — ABNORMAL LOW (ref >59)

## 2024-04-06 MED ORDER — FUROSEMIDE 20 MG PO TABS: 20.0000 mg | ORAL_TABLET | Freq: Every day | ORAL | 11 refills | Status: DC

## 2024-04-06 NOTE — Transitions of Care (Post Inpatient/ED Visit) (Signed)
   04/06/2024  Name: Amanda [name] MRN: [id number] DOB: May 27, 1951  Today's TOC FU Call Status: Today's TOC FU Call Status:: Unsuccessful Call (3rd Attempt) Unsuccessful Call (3rd Attempt) Date: 04/06/24  Attempted to reach the patient regarding the most recent Inpatient/ED visit.  Follow Up Plan: No further outreach attempts will be made at this time. We have been unable to contact the patient.  Brown Cape, RN, BSN, CCM [facility], [name] RN Care Manager Direct Dial: [phone]

## 2024-04-10 ENCOUNTER — Encounter: Attending: Cardiovascular Disease | Admitting: *Deleted

## 2024-04-10 DIAGNOSIS — Z952 Presence of prosthetic heart valve: Secondary | ICD-10-CM | POA: Diagnosis not present

## 2024-04-10 NOTE — Progress Notes (Signed)
 Daily Session Note  Patient Details  Name: Amanda [name] MRN: [id number] Date of Birth: [birth year] Referring Provider:   Flowsheet Row Cardiac Rehab from 02/21/2024 in [facility] Cardiac [facility] Pulmonary [facility]  Referring Provider Dr. Antionette Kirks, MD       Encounter Date: 04/10/2024  Check In:  Session Check In - 04/10/24 1354       Check-In   Supervising physician immediately available to respond to emergencies See telemetry face sheet for immediately available ER MD    Location [facility]-Cardiac & Pulmonary Rehab    Staff Present Sue Em RN,BSN;Margaret Best, MS, Exercise Physiologist;Noah Tickle, BS, Exercise Physiologist;Maxon Conetta BS, Exercise Physiologist    Virtual Visit No    Medication changes reported     Yes    Comments added lasix , spirinolactone, jardiance     Fall or balance concerns reported    No    Tobacco Cessation No Change    Current number of cigarettes/nicotine per day     0    Warm-up and Cool-down Performed on first and last piece of equipment    Resistance Training Performed Yes    VAD Patient? No    PAD/SET Patient? No      Pain Assessment   Currently in Pain? No/denies                Social History   Tobacco Use  Smoking Status Former   Average packs/day: 0.8 packs/day for 45.0 years (33.8 ttl pk-yrs)   Types: Cigarettes   Start date: 01/2023  Smokeless Tobacco Never  Tobacco Comments   Does not on starting back smoking.    Goals Met:  Independence with exercise equipment Exercise tolerated well No report of concerns or symptoms today Strength training completed today  Goals Unmet:  Not Applicable  Comments: Pt able to follow exercise prescription today without complaint.  Will continue to monitor for progression.    Dr. Firman Hughes is Medical Director for [facility] Cardiac Rehabilitation.  Dr. Fuad Aleskerov is Medical Director for [facility] Pulmonary Rehabilitation.

## 2024-04-12 ENCOUNTER — Encounter: Admitting: *Deleted

## 2024-04-12 DIAGNOSIS — Z952 Presence of prosthetic heart valve: Secondary | ICD-10-CM

## 2024-04-12 DIAGNOSIS — Z79899 Other long term (current) drug therapy: Secondary | ICD-10-CM | POA: Diagnosis not present

## 2024-04-12 NOTE — Progress Notes (Signed)
 30 Day review completed. Medical Director ITP review done, changes made as directed, and signed approval by Medical Director. ? ?

## 2024-04-12 NOTE — Progress Notes (Signed)
 Cardiac Individual Treatment Plan  Patient Details  Name: Amanda [name] MRN: [id number] Date of Birth: Jan 05, 1951 Referring Provider:   Flowsheet Row Cardiac Rehab from 02/21/2024 in [facility] Cardiac and Pulmonary Rehab  Referring Provider Dr. Antionette Kirks, MD       Initial Encounter Date:  Flowsheet Row Cardiac Rehab from 02/21/2024 in [facility] Cardiac and Pulmonary Rehab  Date 02/21/24       Visit Diagnosis: S/P TAVR (transcatheter aortic valve replacement)  Patient's Home Medications on Admission:  Current Outpatient Medications:    acetaminophen  (TYLENOL ) 500 MG tablet, Take 1,000 mg by mouth every 8 (eight) hours as needed for mild pain (pain score 1-3) or moderate pain (pain score 4-6)., Disp: , Rfl:    albuterol  (VENTOLIN  HFA) 108 (90 Base) MCG/ACT inhaler, Inhale 2 puffs into the lungs every 4 (four) hours as needed for wheezing or shortness of breath., Disp: 3 each, Rfl: 2   amitriptyline  (ELAVIL ) 50 MG tablet, Take 0.5 tablets (25 mg total) by mouth at bedtime., Disp: 45 tablet, Rfl: 1   Ascorbic Acid  (VITAMIN C ) 1000 MG tablet, Take 1,000 mg by mouth daily., Disp: , Rfl:    aspirin  EC 81 MG tablet, Take 81 mg by mouth daily., Disp: , Rfl:    budesonide  (PULMICORT ) 0.5 MG/2ML nebulizer solution, Take 2 mLs (0.5 mg total) by nebulization 2 (two) times daily., Disp: 360 mL, Rfl: 3   buPROPion  (WELLBUTRIN  XL) 150 MG 24 hr tablet, Take 1 tablet (150 mg total) by mouth daily., Disp: 90 tablet, Rfl: 1   calcium  carbonate (OS-CAL - DOSED IN MG OF ELEMENTAL CALCIUM ) 1250 (500 Ca) MG tablet, Take 1 tablet by mouth daily at 6 (six) AM., Disp: , Rfl:    calcium  carbonate (TUMS - DOSED IN MG ELEMENTAL CALCIUM ) 500 MG chewable tablet, Chew 2 tablets by mouth daily as needed for indigestion or heartburn., Disp: , Rfl:    Cholecalciferol  (VITAMIN D ) 1000 UNITS capsule, Take 2,000 Units by mouth daily., Disp: , Rfl:    clonazePAM  (KLONOPIN ) 0.5 MG tablet, Take 1 tablet by mouth twice daily as  needed, Disp: 60 tablet, Rfl: 5   cyanocobalamin  (VITAMIN B12) 1000 MCG/ML injection, Inject into the muscle., Disp: , Rfl:    cyclobenzaprine  (FLEXERIL ) 10 MG tablet, Take 1 tablet by mouth three times daily as needed for muscle spasm, Disp: 60 tablet, Rfl: 3   dimenhyDRINATE (DRAMAMINE) 50 MG tablet, Take 50 mg by mouth every 8 (eight) hours as needed for nausea or dizziness., Disp: , Rfl:    donepezil  (ARICEPT ) 5 MG tablet, TAKE 1 TABLET BY MOUTH AT BEDTIME, Disp: 90 tablet, Rfl: 0   empagliflozin  (JARDIANCE ) 10 MG TABS tablet, Take 1 tablet (10 mg total) by mouth daily., Disp: 30 tablet, Rfl: 11   ezetimibe  (ZETIA ) 10 MG tablet, Take 1 tablet by mouth once daily, Disp: 90 tablet, Rfl: 3   FLUoxetine  (PROZAC ) 40 MG capsule, Take 80 mg by mouth M, W, F and 40 mg by mouth Tues, Thurs, Sat and Sunday, Disp: 102 capsule, Rfl: 1   fluticasone  (FLONASE) 50 MCG/ACT nasal spray, Place 2 sprays into both nostrils daily as needed for rhinitis., Disp: , Rfl:    furosemide  (LASIX ) 20 MG tablet, Take 1 tablet (20 mg total) by mouth daily., Disp: 30 tablet, Rfl: 11   gabapentin  (NEURONTIN ) 300 MG capsule, Take 300 mg by mouth daily., Disp: , Rfl:    guaiFENesin  (MUCINEX ) 600 MG 12 hr tablet, Take 600 mg by mouth 2 (  two) times daily as needed for to loosen phlegm., Disp: , Rfl:    ipratropium-albuterol  (DUONEB) 0.5-2.5 (3) MG/3ML SOLN, Take 3 mLs by nebulization every 6 (six) hours as needed., Disp: 360 mL, Rfl: 3   magnesium  oxide (MAG-OX) 400 (240 Mg) MG tablet, Take 400 mg by mouth daily., Disp: , Rfl:    metoprolol  tartrate (LOPRESSOR ) 25 MG tablet, TAKE 1/2 TABLET TWICE DAILY, Disp: 90 tablet, Rfl: 1   Multiple Vitamin (MULTIVITAMIN) tablet, Take 1 tablet by mouth daily., Disp: , Rfl:    naproxen  sodium (ALEVE ) 220 MG tablet, Take 220 mg by mouth in the morning., Disp: , Rfl:    pantoprazole  (PROTONIX ) 40 MG tablet, TAKE 1 TABLET 30 MINUTES PRIOR TO BREAKFAST, Disp: 90 tablet, Rfl: 3   rosuvastatin   (CRESTOR ) 40 MG tablet, Take 1 tablet by mouth once daily, Disp: 90 tablet, Rfl: 0   Simethicone  125 MG CAPS, Take 125 mg by mouth daily as needed (gas)., Disp: , Rfl:    spironolactone  (ALDACTONE ) 25 MG tablet, Take 1 tablet (25 mg total) by mouth daily., Disp: 30 tablet, Rfl: 11   vitamin E 180 MG (400 UNITS) capsule, Take 400 Units by mouth daily., Disp: , Rfl:   Past Medical History: Past Medical History:  Diagnosis Date   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 sting    Aortic stenosis    a. 2013: nl LV sys fxn, mild MR, no evidence of pulm htn; b. TTE 8/17: EF 60-65%, no RWMA, nl LV dia fxn,  mild AS, mod AI   CAD (coronary artery disease), native coronary artery 11/26/2020   Controlled type 2 diabetes mellitus with diabetic autonomic neuropathy, without long-term current use of insulin  (HCC) 08/10/2007   COPD (chronic obstructive pulmonary disease) (HCC) 10/30/2016   Depression    GAD (generalized anxiety disorder)    GERD (gastroesophageal reflux disease)    Hiatal hernia with gastroesophageal reflux 1997   Hyperlipidemia    Hypertension    Osteoporosis    S/P TAVR (transcatheter aortic valve replacement) 12/21/2023   s/p TAVR with a 34 mm Evolut FX via the TF approach by Dr. Lorie Rook & Dr. Honey Lusty   Tobacco abuse     Tobacco Use: Social History   Tobacco Use  Smoking Status Former   Average packs/day: 0.8 packs/day for 45.0 years (33.8 ttl pk-yrs)   Types: Cigarettes   Start date: 01/2023  Smokeless Tobacco Never  Tobacco Comments   Does not on starting back smoking.    Labs: Review Flowsheet  More data exists      Latest Ref Rng & Units 01/28/2023 10/04/2023 12/21/2023 03/29/2024 04/05/2024  Labs for ITP Cardiac and Pulmonary Rehab  Cholestrol 100 - 199 mg/dL 161  - - - 096   LDL (calc) 0 - 99 mg/dL 57  - - - 48   HDL-C >04 mg/dL 54.09  - - - 60   Trlycerides 0 - 149 mg/dL 81.1  - - - 914   Hemoglobin A1c 4.8 - 5.6 % 6.4  - - 6.9  -  PH, Arterial 7.35 - 7.45 - 7.353  - 7.37  -  PCO2  arterial 32 - 48 mmHg - 44.6  - 46  -  Bicarbonate 20.0 - 28.0 mmol/L - 25.2  24.8  - 26.6  -  TCO2 22 - 32 mmol/L - 27  26  26   - -  Acid-base deficit 0.0 - 2.0 mmol/L - 1.0  1.0  - - -  O2 Saturation % -  69  95  - 99.1  -    Details       Multiple values from one day are sorted in reverse-chronological order          Exercise Target Goals: Exercise Program Goal: Individual exercise prescription set using results from initial 6 min walk test and THRR while considering  patient's activity barriers and safety.   Exercise Prescription Goal: Initial exercise prescription builds to 30-45 minutes a day of aerobic activity, 2-3 days per week.  Home exercise guidelines will be given to patient during program as part of exercise prescription that the participant will acknowledge.   Education: Aerobic Exercise: - Group verbal and visual presentation on the components of exercise prescription. Introduces F.I.T.T principle from ACSM for exercise prescriptions.  Reviews F.I.T.T. principles of aerobic exercise including progression. Written material given at graduation.   Education: Resistance Exercise: - Group verbal and visual presentation on the components of exercise prescription. Introduces F.I.T.T principle from ACSM for exercise prescriptions  Reviews F.I.T.T. principles of resistance exercise including progression. Written material given at graduation.    Education: Exercise & Equipment Safety: - Individual verbal instruction and demonstration of equipment use and safety with use of the equipment. Flowsheet Row Cardiac Rehab from 03/15/2024 in [facility] Cardiac and Pulmonary Rehab  Date 02/07/24  Educator [facility]  Instruction Review Code 1- Verbalizes Understanding       Education: Exercise Physiology & General Exercise Guidelines: - Group verbal and written instruction with models to review the exercise physiology of the cardiovascular system and associated critical values. Provides  general exercise guidelines with specific guidelines to those with heart or lung disease.    Education: Flexibility, Balance, Mind/Body Relaxation: - Group verbal and visual presentation with interactive activity on the components of exercise prescription. Introduces F.I.T.T principle from ACSM for exercise prescriptions. Reviews F.I.T.T. principles of flexibility and balance exercise training including progression. Also discusses the mind body connection.  Reviews various relaxation techniques to help reduce and manage stress (i.e. Deep breathing, progressive muscle relaxation, and visualization). Balance handout provided to take home. Written material given at graduation.   Activity Barriers & Risk Stratification:  Activity Barriers & Cardiac Risk Stratification - 02/21/24 1558       Activity Barriers & Cardiac Risk Stratification   Activity Barriers History of Falls;Balance Concerns;Left Knee Replacement;Right Knee Replacement;Back Problems;Neck/Spine Problems;Muscular Weakness;Deconditioning;Assistive Device    Cardiac Risk Stratification Low             6 Minute Walk:  6 Minute Walk     Row Name 02/21/24 1557         6 Minute Walk   Phase Initial     Distance 580 feet     Walk Time 6 minutes     # of Rest Breaks 0     MPH 1.1     METS 1     RPE 11     Perceived Dyspnea  1     VO2 Peak 3.21     Symptoms Yes (comment)     Comments leg fatigue, used walker     Resting HR 65 bpm     Resting BP 102/56     Resting Oxygen Saturation  96 %     Exercise Oxygen Saturation  during 6 min walk 94 %     Max Ex. HR 96 bpm     Max Ex. BP 138/70     2 Minute Post BP 134/68  Oxygen Initial Assessment:   Oxygen Re-Evaluation:   Oxygen Discharge (Final Oxygen Re-Evaluation):   Initial Exercise Prescription:  Initial Exercise Prescription - 02/21/24 1600       Date of Initial Exercise RX and Referring Provider   Date 02/21/24    Referring Provider Dr.  Antionette Kirks, MD      Oxygen   Maintain Oxygen Saturation 88% or higher      Recumbant Bike   Level 1    RPM 50    Watts 15    Minutes 15    METs 1      NuStep   Level 1    SPM 80    Minutes 15    METs 1      Track   Laps 15    Minutes 15    METs 1.82      Prescription Details   Frequency (times per week) 3    Duration Progress to 30 minutes of continuous aerobic without signs/symptoms of physical distress      Intensity   THRR 40-80% of Max Heartrate 97-130    Ratings of Perceived Exertion 11-13    Perceived Dyspnea 0-4      Progression   Progression Continue to progress workloads to maintain intensity without signs/symptoms of physical distress.      Resistance Training   Training Prescription Yes    Weight 3 lb    Reps 10-15             Perform Capillary Blood Glucose checks as needed.  Exercise Prescription Changes:   Exercise Prescription Changes     Row Name 02/21/24 1600 03/09/24 0800 03/22/24 1300 04/06/24 1000       Response to Exercise   Blood Pressure (Admit) 102/56 128/60 124/60 122/62    Blood Pressure (Exercise) 138/70 144/70 162/80 148/74    Blood Pressure (Exit) 134/68 118/62 128/64 116/58    Heart Rate (Admit) 65 bpm 82 bpm 103 bpm 106 bpm    Heart Rate (Exercise) 96 bpm 106 bpm 124 bpm 129 bpm    Heart Rate (Exit) 78 bpm 79 bpm 93 bpm 111 bpm    Oxygen Saturation (Admit) 96 % -- -- --    Oxygen Saturation (Exercise) 94 % -- -- --    Rating of Perceived Exertion (Exercise) 11 14 13 15     Perceived Dyspnea (Exercise) 1 -- -- --    Symptoms leg fatgue, used walker none none none    Comments Results First 2 weeks of exercise sessions -- --    Duration -- Continue with 30 min of aerobic exercise without signs/symptoms of physical distress. Continue with 30 min of aerobic exercise without signs/symptoms of physical distress. Continue with 30 min of aerobic exercise without signs/symptoms of physical distress.    Intensity -- THRR  unchanged THRR unchanged THRR unchanged      Progression   Progression -- Continue to progress workloads to maintain intensity without signs/symptoms of physical distress. Continue to progress workloads to maintain intensity without signs/symptoms of physical distress. Continue to progress workloads to maintain intensity without signs/symptoms of physical distress.    Average METs -- 2.24 2.5 2.19      Resistance Training   Training Prescription -- Yes Yes Yes    Weight -- 3 lb 3 lb 3 lb    Reps -- 10-15 10-15 10-15      Interval Training   Interval Training -- No No No      Recumbant  Bike   Level -- 2 3 2     Watts -- 18 15 15     Minutes -- 15 15 15     METs -- 2.45 2.44 2      NuStep   Level -- 2 4 --    Minutes -- 15 15 --    METs -- 2.4 2.7 --      Biostep-RELP   Level -- -- 2 2    Minutes -- -- 15 15    METs -- -- 2 3      Track   Laps -- -- -- 14    Minutes -- -- -- 15    METs -- -- -- 1.76      Oxygen   Maintain Oxygen Saturation -- 88% or higher 88% or higher 88% or higher             Exercise Comments:   Exercise Goals and Review:   Exercise Goals     Row Name 02/21/24 1558             Exercise Goals   Increase Physical Activity Yes       Intervention Provide advice, education, support and counseling about physical activity/exercise needs.;Develop an individualized exercise prescription for aerobic and resistive training based on initial evaluation findings, risk stratification, comorbidities and participant's personal goals.       Expected Outcomes Short Term: Attend rehab on a regular basis to increase amount of physical activity.;Long Term: Exercising regularly at least 3-5 days a week.;Long Term: Add in home exercise to make exercise part of routine and to increase amount of physical activity.       Increase Strength and Stamina Yes       Intervention Provide advice, education, support and counseling about physical activity/exercise  needs.;Develop an individualized exercise prescription for aerobic and resistive training based on initial evaluation findings, risk stratification, comorbidities and participant's personal goals.       Expected Outcomes Short Term: Increase workloads from initial exercise prescription for resistance, speed, and METs.;Short Term: Perform resistance training exercises routinely during rehab and add in resistance training at home;Long Term: Improve cardiorespiratory fitness, muscular endurance and strength as measured by increased METs and functional capacity ( )       Able to understand and use rate of perceived exertion (RPE) scale Yes       Intervention Provide education and explanation on how to use RPE scale       Expected Outcomes Short Term: Able to use RPE daily in rehab to express subjective intensity level;Long Term:  Able to use RPE to guide intensity level when exercising independently       Able to understand and use Dyspnea scale Yes       Intervention Provide education and explanation on how to use Dyspnea scale       Expected Outcomes Short Term: Able to use Dyspnea scale daily in rehab to express subjective sense of shortness of breath during exertion;Long Term: Able to use Dyspnea scale to guide intensity level when exercising independently       Knowledge and understanding of Target Heart Rate Range (THRR) Yes       Intervention Provide education and explanation of THRR including how the numbers were predicted and where they are located for reference       Expected Outcomes Short Term: Able to state/look up THRR;Long Term: Able to use THRR to govern intensity when exercising independently;Short Term: Able to use daily as guideline for  intensity in rehab       Able to check pulse independently Yes       Intervention Provide education and demonstration on how to check pulse in carotid and radial arteries.;Review the importance of being able to check your own pulse for safety during  independent exercise       Expected Outcomes Short Term: Able to explain why pulse checking is important during independent exercise;Long Term: Able to check pulse independently and accurately       Understanding of Exercise Prescription Yes       Intervention Provide education, explanation, and written materials on patient's individual exercise prescription       Expected Outcomes Short Term: Able to explain program exercise prescription;Long Term: Able to explain home exercise prescription to exercise independently                Exercise Goals Re-Evaluation :  Exercise Goals Re-Evaluation     Row Name 03/09/24 0804 03/22/24 1346 04/06/24 1014         Exercise Goal Re-Evaluation   Exercise Goals Review Increase Physical Activity;Understanding of Exercise Prescription;Increase Strength and Stamina Increase Physical Activity;Understanding of Exercise Prescription;Increase Strength and Stamina Increase Physical Activity;Understanding of Exercise Prescription;Increase Strength and Stamina     Comments Amanda [name] is off to a good start in the program and completed her first 2 weeks of exercise sessions in this review. She tolerated her exercise prescription well and increased workloads. She increased to level 2 on the recumbent bike and to level 2 on the T4 nustep. We will continue to monitor her progress in the program. Amanda [name] is doing well in the program. She was recently able to increase her level on the recumbent bike from level 2 to 3. She was also able to increase from level 2 to level 4 on the T4 nustep. We will continue to monitor her progress in the program. Amanda [name] is doing well in the program. She was able to do level 2 on the biostep and recumbent bike. She also did 14 laps on the track. She had been in the hospital and discharged in this review and has yet to recieve clearance to return to rehab as of 4/28. We will continue to monitor her progress in the program when she is able to  return.     Expected Outcomes Short: Continue to follow current exercise prescription. Long: Continue exercise to improve strength and stamina. Short: Continue to increase levels on the T4 nustep and Recumbent bike. Long: Continue exercise to improve strength and stamina. Short: Return to rehab when able. Long: Continue exercise to improve strength and stamina.              Discharge Exercise Prescription (Final Exercise Prescription Changes):  Exercise Prescription Changes - 04/06/24 1000       Response to Exercise   Blood Pressure (Admit) 122/62    Blood Pressure (Exercise) 148/74    Blood Pressure (Exit) 116/58    Heart Rate (Admit) 106 bpm    Heart Rate (Exercise) 129 bpm    Heart Rate (Exit) 111 bpm    Rating of Perceived Exertion (Exercise) 15    Symptoms none    Duration Continue with 30 min of aerobic exercise without signs/symptoms of physical distress.    Intensity THRR unchanged      Progression   Progression Continue to progress workloads to maintain intensity without signs/symptoms of physical distress.    Average METs 2.19      Resistance  Training   Training Prescription Yes    Weight 3 lb    Reps 10-15      Interval Training   Interval Training No      Recumbant Bike   Level 2    Watts 15    Minutes 15    METs 2      Biostep-RELP   Level 2    Minutes 15    METs 3      Track   Laps 14    Minutes 15    METs 1.76      Oxygen   Maintain Oxygen Saturation 88% or higher             Nutrition:  Target Goals: Understanding of nutrition guidelines, daily intake of sodium 1500mg , cholesterol 200mg , calories 30% from fat and 7% or less from saturated fats, daily to have 5 or more servings of fruits and vegetables.  Education: All About Nutrition: -Group instruction provided by verbal, written material, interactive activities, discussions, models, and posters to present general guidelines for heart healthy nutrition including fat, fiber, MyPlate,  the role of sodium in heart healthy nutrition, utilization of the nutrition label, and utilization of this knowledge for meal planning. Follow up email sent as well. Written material given at graduation. Flowsheet Row Cardiac Rehab from 03/15/2024 in [facility] Cardiac and Pulmonary Rehab  Date 03/08/24  Amanda [name] 2]  Educator Efren Grapes  Instruction Review Code 1- Verbalizes Understanding       Biometrics:  Pre Biometrics - 02/21/24 1559       Pre Biometrics   Height 5' 6.5" (1.689 m)    Weight 235 lb 1.6 oz (106.6 kg)    Waist Circumference 47.5 inches    Hip Circumference 53 inches    Waist to Hip Ratio 0.9 %    BMI (Calculated) 37.38    Single Leg Stand 0 seconds   unable to complete             Nutrition Therapy Plan and Nutrition Goals:  Nutrition Therapy & Goals - 02/23/24 1612       Nutrition Therapy   Diet Carb controlled, Cardiac, Low Na    Protein (specify units) 60-90    Fiber 25 grams    Whole Grain Foods 3 servings    Saturated Fats 15 max. grams    Fruits and Vegetables 5 servings/day    Sodium 2 grams      Personal Nutrition Goals   Nutrition Goal Make plates with more veggies, make daughter healthy plates    Personal Goal #2 Read labels and reduce sodium intake to below 2300mg . Ideally 1500mg  per day.    Personal Goal #3 Eat 15-30gProtein and 30-60gCarbs at each meal.    Comments Patient drinking mostly water, she has DM and doesn't drink any sugary beverages. She is looking to lose weight and eat heart healthy. She used to make meals for her and her husband that were made of a protein, a carb, and a veggie. When her husband passed in late November she received lots of food, then with the holiday she got more. Her freezer was full and she has been eating out of it for the past few months. But now that she is close to finishing off that food, she is having to adjust to her new routine living with her daughter and new kitchen appliances. Provided counseling, support and  encouragement. Reviewed a few facts labels and brainstormed ways to get back into healthier eating centered  around more veggies and less high calorie foods. She likes veggies and says she could work on eating more. Discussed building plates for her and her daughter with more low calorie foods and still having rooms for some of their favorites like pizza and venison burgers.      Intervention Plan   Intervention Prescribe, educate and counsel regarding individualized specific dietary modifications aiming towards targeted core components such as weight, hypertension, lipid management, diabetes, heart failure and other comorbidities.;Nutrition handout(s) given to patient.    Expected Outcomes Short Term Goal: Understand basic principles of dietary content, such as calories, fat, sodium, cholesterol and nutrients.;Short Term Goal: A plan has been developed with personal nutrition goals set during dietitian appointment.;Long Term Goal: Adherence to prescribed nutrition plan.             Nutrition Assessments:  MEDIFICTS Score Key: >=70 Need to make dietary changes  40-70 Heart Healthy Diet <= 40 Therapeutic Level Cholesterol Diet   Picture Your Plate Scores: <81 Unhealthy dietary pattern with much room for improvement. 41-50 Dietary pattern unlikely to meet recommendations for good health and room for improvement. 51-60 More healthful dietary pattern, with some room for improvement.  >60 Healthy dietary pattern, although there may be some specific behaviors that could be improved.    Nutrition Goals Re-Evaluation:  Nutrition Goals Re-Evaluation     Row Name 04/10/24 1409             Goals   Comment Amanda [name] is still working on eating less salt and sugar. Her daughter has been cooking a lot of her food lately. She says her daughter watches her salt and sugar intake. Encouraged her to eat 3 times per day, include protein rich foods and colorful produce when able       Expected Outcome  STG: Eat small frequent meals. LTG: follow a heart healthy diet and support increased needs                Nutrition Goals Discharge (Final Nutrition Goals Re-Evaluation):  Nutrition Goals Re-Evaluation - 04/10/24 1409       Goals   Comment Amanda [name] is still working on eating less salt and sugar. Her daughter has been cooking a lot of her food lately. She says her daughter watches her salt and sugar intake. Encouraged her to eat 3 times per day, include protein rich foods and colorful produce when able    Expected Outcome STG: Eat small frequent meals. LTG: follow a heart healthy diet and support increased needs             Psychosocial: Target Goals: Acknowledge presence or absence of significant depression and/or stress, maximize coping skills, provide positive support system. Participant is able to verbalize types and ability to use techniques and skills needed for reducing stress and depression.   Education: Stress, Anxiety, and Depression - Group verbal and visual presentation to define topics covered.  Reviews how body is impacted by stress, anxiety, and depression.  Also discusses healthy ways to reduce stress and to treat/manage anxiety and depression.  Written material given at graduation.   Education: Sleep Hygiene -Provides group verbal and written instruction about how sleep can affect your health.  Define sleep hygiene, discuss sleep cycles and impact of sleep habits. Review good sleep hygiene tips.    Initial Review & Psychosocial Screening:  Initial Psych Review & Screening - 02/07/24 1207       Initial Review   Current issues with Current Depression;History of Depression;Current  Anxiety/Panic;Current Psychotropic Meds      Family Dynamics   Good Support System? Yes    Comments Amanda [name] has been on medication for years for her depression and anxiety. Her husband died in 11/03/24 and she had her health delice with her valve replacement. She can look to her  daughter and son for support.      Barriers   Psychosocial barriers to participate in program The patient should benefit from training in stress management and relaxation.;There are no identifiable barriers or psychosocial needs.      Screening Interventions   Interventions Provide feedback about the scores to participant;To provide support and resources with identified psychosocial needs;Encouraged to exercise    Expected Outcomes Short Term goal: Utilizing psychosocial counselor, staff and physician to assist with identification of specific Stressors or current issues interfering with healing process. Setting desired goal for each stressor or current issue identified.;Long Term Goal: Stressors or current issues are controlled or eliminated.;Short Term goal: Identification and review with participant of any Quality of Life or Depression concerns found by scoring the questionnaire.;Long Term goal: The participant improves quality of Life and PHQ9 Scores as seen by post scores and/or verbalization of changes             Quality of Life Scores:   Scores of 19 and below usually indicate a poorer quality of life in these areas.  A difference of  2-3 points is a clinically meaningful difference.  A difference of 2-3 points in the total score of the Quality of Life Index has been associated with significant improvement in overall quality of life, self-image, physical symptoms, and general health in studies assessing change in quality of life.  PHQ-9: Review Flowsheet  More data exists      04/10/2024 04/05/2024 02/21/2024 01/28/2023 12/18/2022  Depression screen PHQ 2/9  Decreased Interest 3 3 3 3 2  0  Down, Depressed, Hopeless 3 3 3 3 3  0  PHQ - 2 Score 6 6 6 6 5  0  Altered sleeping 3 3 3 3 3  -  Tired, decreased energy 3 3 3 3 3  -  Change in appetite 3 3 3 3 3  -  Feeling bad or failure about yourself  3 3 2 3 1  -  Trouble concentrating 3 3 3 3 3  -  Moving slowly or fidgety/restless 0 0 3 0 2 -   Suicidal thoughts 0 0 0 0 0 -  PHQ-9 Score 21 21 23 21 20  -  Difficult doing work/chores Extremely dIfficult Extremely dIfficult Extremely dIfficult Very difficult Very difficult -    Details       Multiple values from one day are sorted in reverse-chronological order        Interpretation of Total Score  Total Score Depression Severity:  1-4 = Minimal depression, 5-9 = Mild depression, 10-14 = Moderate depression, 15-19 = Moderately severe depression, 20-27 = Severe depression   Psychosocial Evaluation and Intervention:  Psychosocial Evaluation - 02/07/24 1209       Psychosocial Evaluation & Interventions   Interventions Encouraged to exercise with the program and follow exercise prescription;Relaxation education;Stress management education    Comments Amanda [name] has been on medication for years for her depression and anxiety. Her husband died in 03-Nov-2024 and she had her health delice with her valve replacement. She can look to her daughter and son for support.    Expected Outcomes Short: Attend [facility] stress management education to decrease stress. Long: Maintain exercise Post [facility] to keep stress  at a minimum.    Continue Psychosocial Services  Follow up required by staff             Psychosocial Re-Evaluation:  Psychosocial Re-Evaluation     Row Name 04/10/24 1406             Psychosocial Re-Evaluation   Current issues with Current Sleep Concerns;Current Anxiety/Panic;Current Depression       Comments Amanda [name] has returned to rehab after a break. She is still exeperiencing a lot of stress, anxiety and depression. She takes meds and reports it helps a lot but sometimes it can still ve difficult. She is not sleeping. Says she cant lay down flat so she has been sleeping in a recliner, but often cant sleep instead watching TV. She says her Dr gave her some meds to sleep better, she reports it works well but takes some time to begin working. Encuraged her to take the  meds well before bed time so it begins working around the time she wants to sleep and to turn off the TV       Expected Outcomes STG: take meds and get good nights sleep. LTG: achieve and maintain a positive outlook on health and daily life       Interventions Encouraged to attend Cardiac Rehabilitation for the exercise       Continue Psychosocial Services  Follow up required by staff                Psychosocial Discharge (Final Psychosocial Re-Evaluation):  Psychosocial Re-Evaluation - 04/10/24 1406       Psychosocial Re-Evaluation   Current issues with Current Sleep Concerns;Current Anxiety/Panic;Current Depression    Comments Amanda [name] has returned to rehab after a break. She is still exeperiencing a lot of stress, anxiety and depression. She takes meds and reports it helps a lot but sometimes it can still ve difficult. She is not sleeping. Says she cant lay down flat so she has been sleeping in a recliner, but often cant sleep instead watching TV. She says her Dr gave her some meds to sleep better, she reports it works well but takes some time to begin working. Encuraged her to take the meds well before bed time so it begins working around the time she wants to sleep and to turn off the TV    Expected Outcomes STG: take meds and get good nights sleep. LTG: achieve and maintain a positive outlook on health and daily life    Interventions Encouraged to attend Cardiac Rehabilitation for the exercise    Continue Psychosocial Services  Follow up required by staff             Vocational Rehabilitation: Provide vocational rehab assistance to qualifying candidates.   Vocational Rehab Evaluation & Intervention:   Education: Education Goals: Education classes will be provided on a variety of topics geared toward better understanding of heart health and risk factor modification. Participant will state understanding/return demonstration of topics presented as noted by education test  scores.  Learning Barriers/Preferences:  Learning Barriers/Preferences - 02/07/24 1206       Learning Barriers/Preferences   Learning Barriers Sight    Learning Preferences None             General Cardiac Education Topics:  AED/CPR: - Group verbal and written instruction with the use of models to demonstrate the basic use of the AED with the basic ABC's of resuscitation.   Anatomy and Cardiac Procedures: - Group verbal and visual presentation and  models provide information about basic cardiac anatomy and function. Reviews the testing methods done to diagnose heart disease and the outcomes of the test results. Describes the treatment choices: Medical Management, Angioplasty, or Coronary Bypass Surgery for treating various heart conditions including Myocardial Infarction, Angina, Valve Disease, and Cardiac Arrhythmias.  Written material given at graduation. Flowsheet Row Cardiac Rehab from 03/15/2024 in [facility] Cardiac and Pulmonary Rehab  Date 03/15/24  Educator SB  Instruction Review Code 1- Verbalizes Understanding       Medication Safety: - Group verbal and visual instruction to review commonly prescribed medications for heart and lung disease. Reviews the medication, class of the drug, and side effects. Includes the steps to properly store meds and maintain the prescription regimen.  Written material given at graduation.   Intimacy: - Group verbal instruction through game format to discuss how heart and lung disease can affect sexual intimacy. Written material given at graduation..   Know Your Numbers and Heart Failure: - Group verbal and visual instruction to discuss disease risk factors for cardiac and pulmonary disease and treatment options.  Reviews associated critical values for Overweight/Obesity, Hypertension, Cholesterol, and Diabetes.  Discusses basics of heart failure: signs/symptoms and treatments.  Introduces Heart Failure Zone chart for action plan for heart  failure.  Written material given at graduation.   Infection Prevention: - Provides verbal and written material to individual with discussion of infection control including proper hand washing and proper equipment cleaning during exercise session. Flowsheet Row Cardiac Rehab from 03/15/2024 in [facility] Cardiac and Pulmonary Rehab  Date 02/07/24  Educator [facility]  Instruction Review Code 1- Verbalizes Understanding       Falls Prevention: - Provides verbal and written material to individual with discussion of falls prevention and safety. Flowsheet Row Cardiac Rehab from 03/15/2024 in [facility] Cardiac and Pulmonary Rehab  Date 02/07/24  Educator [facility]  Instruction Review Code 1- Verbalizes Understanding       Other: -Provides group and verbal instruction on various topics (see comments)   Knowledge Questionnaire Score:   Core Components/Risk Factors/Patient Goals at Admission:  Personal Goals and Risk Factors at Admission - 02/07/24 1206       Core Components/Risk Factors/Patient Goals on Admission    Weight Management Yes;Weight Loss    Intervention Weight Management: Develop a combined nutrition and exercise program designed to reach desired caloric intake, while maintaining appropriate intake of nutrient and fiber, sodium and fats, and appropriate energy expenditure required for the weight goal.;Weight Management: Provide education and appropriate resources to help participant work on and attain dietary goals.;Weight Management/Obesity: Establish reasonable short term and long term weight goals.    Expected Outcomes Short Term: Continue to assess and modify interventions until short term weight is achieved;Weight Maintenance: Understanding of the daily nutrition guidelines, which includes 25-35% calories from fat, 7% or less cal from saturated fats, less than 200mg  cholesterol, less than 1.5gm of sodium, & 5 or more servings of fruits and vegetables daily;Weight Loss: Understanding of general  recommendations for a balanced deficit meal plan, which promotes 1-2 lb weight loss per week and includes a negative energy balance of [phone] kcal/d;Understanding recommendations for meals to include 15-35% energy as protein, 25-35% energy from fat, 35-60% energy from carbohydrates, less than 200mg  of dietary cholesterol, 20-35 gm of total fiber daily;Understanding of distribution of calorie intake throughout the day with the consumption of 4-5 meals/snacks    Tobacco Cessation Yes    Number of packs per day 0    Intervention Assist  the participant in steps to quit. Provide individualized education and counseling about committing to Tobacco Cessation, relapse prevention, and pharmacological support that can be provided by physician.;Education officer, environmental, assist with locating and accessing local/national Quit Smoking programs, and support quit date choice.    Expected Outcomes Short Term: Will demonstrate readiness to quit, by selecting a quit date.;Long Term: Complete abstinence from all tobacco products for at least 12 months from quit date.;Short Term: Will quit all tobacco product use, adhering to prevention of relapse plan.    Diabetes Yes    Intervention Provide education about signs/symptoms and action to take for hypo/hyperglycemia.;Provide education about proper nutrition, including hydration, and aerobic/resistive exercise prescription along with prescribed medications to achieve blood glucose in normal ranges: Fasting glucose 65-99 mg/dL    Expected Outcomes Short Term: Participant verbalizes understanding of the signs/symptoms and immediate care of hyper/hypoglycemia, proper foot care and importance of medication, aerobic/resistive exercise and nutrition plan for blood glucose control.;Long Term: Attainment of HbA1C < 7%.    Hypertension Yes    Intervention Provide education on lifestyle modifcations including regular physical activity/exercise, weight management, moderate sodium  restriction and increased consumption of fresh fruit, vegetables, and low fat dairy, alcohol moderation, and smoking cessation.;Monitor prescription use compliance.    Expected Outcomes Long Term: Maintenance of blood pressure at goal levels.;Short Term: Continued assessment and intervention until BP is < 140/18mm HG in hypertensive participants. < 130/38mm HG in hypertensive participants with diabetes, heart failure or chronic kidney disease.    Lipids Yes    Intervention Provide education and support for participant on nutrition & aerobic/resistive exercise along with prescribed medications to achieve LDL 70mg , HDL >40mg .    Expected Outcomes Short Term: Participant states understanding of desired cholesterol values and is compliant with medications prescribed. Participant is following exercise prescription and nutrition guidelines.;Long Term: Cholesterol controlled with medications as prescribed, with individualized exercise RX and with personalized nutrition plan. Value goals: LDL < 70mg , HDL > 40 mg.             Education:Diabetes - Individual verbal and written instruction to review signs/symptoms of diabetes, desired ranges of glucose level fasting, after meals and with exercise. Acknowledge that pre and post exercise glucose checks will be done for 3 sessions at entry of program. Flowsheet Row Cardiac Rehab from 03/15/2024 in [facility] Cardiac and Pulmonary Rehab  Date 02/07/24  Educator [facility]  Instruction Review Code 1- Verbalizes Understanding       Core Components/Risk Factors/Patient Goals Review:   Goals and Risk Factor Review     Row Name 04/10/24 1411             Core Components/Risk Factors/Patient Goals Review   Personal Goals Review Hypertension       Review Sha reports she checks her Blood pressure at home. Encouraged her to continue to monitor her blood pressure and report any changes to rehab team or Dr       Expected Outcomes STG: check BP at home. LTG: manage risk  factors independently                Core Components/Risk Factors/Patient Goals at Discharge (Final Review):   Goals and Risk Factor Review - 04/10/24 1411       Core Components/Risk Factors/Patient Goals Review   Personal Goals Review Hypertension    Review Amanda [name] reports she checks her Blood pressure at home. Encouraged her to continue to monitor her blood pressure and report any changes to rehab team or  Dr    Expected Outcomes STG: check BP at home. LTG: manage risk factors independently             ITP Comments:  ITP Comments     Row Name 02/07/24 1205 02/21/24 1553 03/15/24 0945 04/12/24 1037     ITP Comments Virtual Visit completed. Patient informed on EP and RD appointment and 6 Minute walk test. Patient also informed of patient health questionnaires on My Chart. Patient Verbalizes understanding. Visit diagnosis can be found in [facility] 12/21/2023. Completed and gym orientation. Initial ITP created and sent for review to Dr. Firman Hughes, Medical Director. 30 Day review completed. Medical Director ITP review done, changes made as directed, and signed approval by Medical Director.   new to program 30 Day review completed. Medical Director ITP review done, changes made as directed, and signed approval by Medical Director.             Comments: 30 day review

## 2024-04-12 NOTE — Progress Notes (Signed)
 Daily Session Note  Patient Details  Name: Amanda [name] MRN: [id number] Date of Birth: 06/30/1951 Referring Provider:   Flowsheet Row Cardiac Rehab from 02/21/2024 in [facility] Cardiac and Pulmonary Rehab  Referring Provider Dr. Antionette Kirks, MD       Encounter Date: 04/12/2024  Check In:  Session Check In - 04/12/24 1357       Check-In   Supervising physician immediately available to respond to emergencies See telemetry face sheet for immediately available ER MD    Location [facility]-Cardiac & Pulmonary Rehab    Staff Present Sue Em RN,BSN;Kelly Sabra Cramp BS, ACSM CEP, Exercise Physiologist;Noah Tickle, BS, Exercise Physiologist;Jason Martina Sledge RDN,LDN    Virtual Visit No    Medication changes reported     No    Fall or balance concerns reported    No    Warm-up and Cool-down Performed on first and last piece of equipment    Resistance Training Performed Yes    VAD Patient? No    PAD/SET Patient? No      Pain Assessment   Currently in Pain? No/denies                Social History   Tobacco Use  Smoking Status Former   Average packs/day: 0.8 packs/day for 45.0 years (33.8 ttl pk-yrs)   Types: Cigarettes   Start date: 01/2023  Smokeless Tobacco Never  Tobacco Comments   Does not on starting back smoking.    Goals Met:  Independence with exercise equipment Exercise tolerated well No report of concerns or symptoms today Strength training completed today  Goals Unmet:  Not Applicable  Comments: Pt able to follow exercise prescription today without complaint.  Will continue to monitor for progression.    Dr. Firman Hughes is Medical Director for [facility] Cardiac Rehabilitation.  Dr. Fuad Aleskerov is Medical Director for [facility] Pulmonary Rehabilitation.

## 2024-04-13 ENCOUNTER — Encounter: Admitting: *Deleted

## 2024-04-13 DIAGNOSIS — Z952 Presence of prosthetic heart valve: Secondary | ICD-10-CM | POA: Diagnosis not present

## 2024-04-13 LAB — BASIC METABOLIC PANEL WITH GFR
BUN/Creatinine Ratio: 9 — ABNORMAL LOW (ref 12–28)
BUN: 10 mg/dL (ref 8–27)
CO2: 21 mmol/L (ref 20–29)
Calcium: 9.6 mg/dL (ref 8.7–10.3)
Chloride: 99 mmol/L (ref 96–106)
Creatinine, Ser: 1.1 mg/dL — ABNORMAL HIGH (ref 0.57–1.00)
Glucose: 127 mg/dL — ABNORMAL HIGH (ref 70–99)
Potassium: 4 mmol/L (ref 3.5–5.2)
Sodium: 137 mmol/L (ref 134–144)
eGFR: 53 mL/min/{1.73_m2} — ABNORMAL LOW (ref >59)

## 2024-04-13 NOTE — Progress Notes (Signed)
 Daily Session Note  Patient Details  Name: Amanda [name] MRN: [id number] Date of Birth: October 17, 1951 Referring Provider:   Flowsheet Row Cardiac Rehab from 02/21/2024 in [facility] Cardiac and Pulmonary Rehab  Referring Provider Dr. Antionette Kirks, MD       Encounter Date: 04/13/2024  Check In:  Session Check In - 04/13/24 1355       Check-In   Supervising physician immediately available to respond to emergencies See telemetry face sheet for immediately available ER MD    Location [facility]-Cardiac & Pulmonary Rehab    Staff Present Sue Em RN,BSN;Maxon Conetta BS, Exercise Physiologist;Noah Tickle, BS, Exercise Physiologist    Virtual Visit No    Medication changes reported     No    Fall or balance concerns reported    No    Warm-up and Cool-down Performed on first and last piece of equipment    Resistance Training Performed Yes    VAD Patient? No    PAD/SET Patient? No      Pain Assessment   Currently in Pain? No/denies                Social History   Tobacco Use  Smoking Status Former   Average packs/day: 0.8 packs/day for 45.0 years (33.8 ttl pk-yrs)   Types: Cigarettes   Start date: 01/2023  Smokeless Tobacco Never  Tobacco Comments   Does not on starting back smoking.    Goals Met:  Independence with exercise equipment Exercise tolerated well No report of concerns or symptoms today Strength training completed today  Goals Unmet:  Not Applicable  Comments: Pt able to follow exercise prescription today without complaint.  Will continue to monitor for progression.    Dr. Firman Hughes is Medical Director for [facility] Cardiac Rehabilitation.  Dr. Fuad Aleskerov is Medical Director for [facility] Pulmonary Rehabilitation.

## 2024-04-14 ENCOUNTER — Encounter: Payer: Self-pay | Admitting: Family Medicine

## 2024-04-14 ENCOUNTER — Other Ambulatory Visit: Payer: Self-pay

## 2024-04-14 DIAGNOSIS — Z79899 Other long term (current) drug therapy: Secondary | ICD-10-CM

## 2024-04-14 DIAGNOSIS — I5032 Chronic diastolic (congestive) heart failure: Secondary | ICD-10-CM

## 2024-04-15 ENCOUNTER — Other Ambulatory Visit: Payer: Self-pay | Admitting: Family Medicine

## 2024-04-15 DIAGNOSIS — R519 Headache, unspecified: Secondary | ICD-10-CM

## 2024-04-15 NOTE — Telephone Encounter (Signed)
 Can you have the office cancel the appointment?   Can you show me again the new way to message the front office?

## 2024-04-17 ENCOUNTER — Ambulatory Visit: Admitting: Family Medicine

## 2024-04-17 ENCOUNTER — Encounter: Admitting: *Deleted

## 2024-04-17 DIAGNOSIS — Z952 Presence of prosthetic heart valve: Secondary | ICD-10-CM

## 2024-04-17 NOTE — Telephone Encounter (Signed)
 Appointment cancelled

## 2024-04-17 NOTE — Progress Notes (Signed)
 Daily Session Note  Patient Details  Name: Amanda [name] MRN: [id number] Date of Birth: 1951/02/14 Referring Provider:   Flowsheet Row Cardiac Rehab from 02/21/2024 in [facility] Cardiac and Pulmonary Rehab  Referring Provider Dr. Antionette Kirks, MD       Encounter Date: 04/17/2024  Check In:  Session Check In - 04/17/24 1336       Check-In   Supervising physician immediately available to respond to emergencies See telemetry face sheet for immediately available ER MD    Location [facility]-Cardiac & Pulmonary Rehab    Staff Present Sue Em RN,BSN;Maxon Conetta BS, Exercise Physiologist;Margaret Best, MS, Exercise Physiologist;Noah Tickle, BS, Exercise Physiologist    Virtual Visit No    Medication changes reported     No    Warm-up and Cool-down Performed on first and last piece of equipment    Resistance Training Performed Yes    VAD Patient? No    PAD/SET Patient? No      Pain Assessment   Currently in Pain? No/denies                Social History   Tobacco Use  Smoking Status Former   Average packs/day: 0.8 packs/day for 45.0 years (33.8 ttl pk-yrs)   Types: Cigarettes   Start date: 01/2023  Smokeless Tobacco Never  Tobacco Comments   Does not on starting back smoking.    Goals Met:  Independence with exercise equipment Exercise tolerated well No report of concerns or symptoms today Strength training completed today  Goals Unmet:  Not Applicable  Comments: Pt able to follow exercise prescription today without complaint.  Will continue to monitor for progression.    Dr. Firman Hughes is Medical Director for [facility] Cardiac Rehabilitation.  Dr. Fuad Aleskerov is Medical Director for [facility] Pulmonary Rehabilitation.

## 2024-04-18 ENCOUNTER — Ambulatory Visit: Payer: Self-pay | Admitting: Emergency Medicine

## 2024-04-18 DIAGNOSIS — R202 Paresthesia of skin: Secondary | ICD-10-CM | POA: Diagnosis not present

## 2024-04-18 DIAGNOSIS — Z79899 Other long term (current) drug therapy: Secondary | ICD-10-CM

## 2024-04-18 DIAGNOSIS — I5032 Chronic diastolic (congestive) heart failure: Secondary | ICD-10-CM

## 2024-04-18 DIAGNOSIS — R200 Anesthesia of skin: Secondary | ICD-10-CM | POA: Diagnosis not present

## 2024-04-18 MED ORDER — FUROSEMIDE 20 MG PO TABS: 20.0000 mg | ORAL_TABLET | ORAL | 11 refills | Status: DC

## 2024-04-19 ENCOUNTER — Encounter: Admitting: *Deleted

## 2024-04-19 ENCOUNTER — Ambulatory Visit

## 2024-04-19 DIAGNOSIS — Z952 Presence of prosthetic heart valve: Secondary | ICD-10-CM | POA: Diagnosis not present

## 2024-04-19 DIAGNOSIS — Z79899 Other long term (current) drug therapy: Secondary | ICD-10-CM | POA: Diagnosis not present

## 2024-04-19 DIAGNOSIS — I5032 Chronic diastolic (congestive) heart failure: Secondary | ICD-10-CM | POA: Diagnosis not present

## 2024-04-19 NOTE — Progress Notes (Signed)
 Daily Session Note  Patient Details  Name: Amanda [name] MRN: [id number] Date of Birth: [birth year] Referring Provider:   Flowsheet Row Cardiac Rehab from 02/21/2024 in [facility] Cardiac and Pulmonary Rehab  Referring Provider Dr. Antionette Kirks, MD       Encounter Date: 04/19/2024  Check In:  Session Check In - 04/19/24 1345       Check-In   Supervising physician immediately available to respond to emergencies See telemetry face sheet for immediately available ER MD    Location [facility]-Cardiac & Pulmonary Rehab    Staff Present Sue Em RN,BSN;Susanne Bice, RN, BSN, CCRP;Kelly Hayes BS, ACSM CEP, Exercise Physiologist;Noah Tickle, BS, Exercise Physiologist    Virtual Visit No    Medication changes reported     No    Fall or balance concerns reported    No    Warm-up and Cool-down Performed on first and last piece of equipment    Resistance Training Performed Yes    VAD Patient? No    PAD/SET Patient? No      Pain Assessment   Currently in Pain? No/denies                Social History   Tobacco Use  Smoking Status Former   Average packs/day: 0.8 packs/day for 45.0 years (33.8 ttl pk-yrs)   Types: Cigarettes   Start date: 01/2023  Smokeless Tobacco Never  Tobacco Comments   Does not on starting back smoking.    Goals Met:  Independence with exercise equipment Exercise tolerated well No report of concerns or symptoms today Strength training completed today  Goals Unmet:  Not Applicable  Comments: Pt able to follow exercise prescription today without complaint.  Will continue to monitor for progression.    Dr. Firman Hughes is Medical Director for [facility] Cardiac Rehabilitation.  Dr. Fuad Aleskerov is Medical Director for [facility] Pulmonary Rehabilitation.

## 2024-04-20 ENCOUNTER — Encounter: Admitting: *Deleted

## 2024-04-20 DIAGNOSIS — Z952 Presence of prosthetic heart valve: Secondary | ICD-10-CM

## 2024-04-20 LAB — BASIC METABOLIC PANEL WITH GFR
BUN/Creatinine Ratio: 11 — ABNORMAL LOW (ref 12–28)
BUN: 9 mg/dL (ref 8–27)
CO2: 18 mmol/L — ABNORMAL LOW (ref 20–29)
Calcium: 9.1 mg/dL (ref 8.7–10.3)
Chloride: 106 mmol/L (ref 96–106)
Creatinine, Ser: 0.85 mg/dL (ref 0.57–1.00)
Glucose: 196 mg/dL — ABNORMAL HIGH (ref 70–99)
Potassium: 3.8 mmol/L (ref 3.5–5.2)
Sodium: 141 mmol/L (ref 134–144)
eGFR: 72 mL/min/{1.73_m2} (ref >59)

## 2024-04-20 NOTE — Progress Notes (Signed)
 Daily Session Note  Patient Details  Name: Amanda [name] MRN: [id number] Date of Birth: 1951-11-13 Referring Provider:   Flowsheet Row Cardiac Rehab from 02/21/2024 in [facility] Cardiac and Pulmonary Rehab  Referring Provider Dr. Antionette Kirks, MD       Encounter Date: 04/20/2024  Check In:  Session Check In - 04/20/24 1344       Check-In   Supervising physician immediately available to respond to emergencies See telemetry face sheet for immediately available ER MD    Location [facility]-Cardiac & Pulmonary Rehab    Staff Present Sue Em RN,BSN;[facility] BS, Exercise Physiologist;Noah Tickle, BS, Exercise Physiologist    Virtual Visit No    Medication changes reported     No    Fall or balance concerns reported    No    Warm-up and Cool-down Performed on first and last piece of equipment    Resistance Training Performed Yes    VAD Patient? No    PAD/SET Patient? No      Pain Assessment   Currently in Pain? No/denies                Social History   Tobacco Use  Smoking Status Former   Average packs/day: 0.8 packs/day for 45.0 years (33.8 ttl pk-yrs)   Types: Cigarettes   Start date: 01/2023  Smokeless Tobacco Never  Tobacco Comments   Does not on starting back smoking.    Goals Met:  Independence with exercise equipment Exercise tolerated well No report of concerns or symptoms today Strength training completed today  Goals Unmet:  Not Applicable  Comments: Pt able to follow exercise prescription today without complaint.  Will continue to monitor for progression.    Dr. Firman Hughes is Medical Director for [facility] Cardiac Rehabilitation.  Dr. Fuad Aleskerov is Medical Director for [facility] Pulmonary Rehabilitation.

## 2024-04-24 ENCOUNTER — Encounter

## 2024-04-26 ENCOUNTER — Other Ambulatory Visit: Payer: Self-pay

## 2024-04-26 ENCOUNTER — Encounter: Admitting: *Deleted

## 2024-04-26 DIAGNOSIS — Z952 Presence of prosthetic heart valve: Secondary | ICD-10-CM | POA: Diagnosis not present

## 2024-04-26 NOTE — Progress Notes (Signed)
 Daily Session Note  Patient Details  Name: Amanda [name] MRN: [id number] Date of Birth: 1950-12-14 Referring Provider:   Flowsheet Row Cardiac Rehab from 02/21/2024 in [facility] Cardiac and Pulmonary Rehab  Referring Provider Dr. Antionette Kirks, MD       Encounter Date: 04/26/2024  Check In:  Session Check In - 04/26/24 1337       Check-In   Supervising physician immediately available to respond to emergencies See telemetry face sheet for immediately available ER MD    Location [facility]-Cardiac & Pulmonary Rehab    Staff Present Sue Em RN,BSN;Noah Tickle, BS, Exercise Physiologist;Kelly Dawne Euler, ACSM CEP, Exercise Physiologist    Virtual Visit No    Medication changes reported     Yes    Comments lasix  every other day    Fall or balance concerns reported    No    Warm-up and Cool-down Performed on first and last piece of equipment    Resistance Training Performed Yes    VAD Patient? No    PAD/SET Patient? No      Pain Assessment   Currently in Pain? No/denies                Social History   Tobacco Use  Smoking Status Former   Average packs/day: 0.8 packs/day for 45.0 years (33.8 ttl pk-yrs)   Types: Cigarettes   Start date: 01/2023  Smokeless Tobacco Never  Tobacco Comments   Does not on starting back smoking.    Goals Met:  Independence with exercise equipment Exercise tolerated well No report of concerns or symptoms today Strength training completed today  Goals Unmet:  Not Applicable  Comments: Pt able to follow exercise prescription today without complaint.  Will continue to monitor for progression.    Dr. Firman Hughes is Medical Director for [facility] Cardiac Rehabilitation.  Dr. Fuad Aleskerov is Medical Director for [facility] Pulmonary Rehabilitation.

## 2024-04-27 ENCOUNTER — Encounter: Admitting: *Deleted

## 2024-04-27 DIAGNOSIS — Z952 Presence of prosthetic heart valve: Secondary | ICD-10-CM

## 2024-04-27 NOTE — Progress Notes (Signed)
 Daily Session Note  Patient Details  Name: Amanda [name] MRN: [id number] Date of Birth: Apr 26, 1951 Referring Provider:   Flowsheet Row Cardiac Rehab from 02/21/2024 in [facility] Cardiac and Pulmonary Rehab  Referring Provider Dr. Antionette Kirks, MD       Encounter Date: 04/27/2024  Check In:  Session Check In - 04/27/24 1346       Check-In   Supervising physician immediately available to respond to emergencies See telemetry face sheet for immediately available ER MD    Location [facility]-Cardiac & Pulmonary Rehab    Staff Present Sue Em RN,BSN;[facility] BS, Exercise Physiologist;Noah Tickle, BS, Exercise Physiologist    Virtual Visit No    Medication changes reported     No    Fall or balance concerns reported    No    Warm-up and Cool-down Performed on first and last piece of equipment    Resistance Training Performed Yes    VAD Patient? No    PAD/SET Patient? No      Pain Assessment   Currently in Pain? No/denies                Social History   Tobacco Use  Smoking Status Former   Average packs/day: 0.8 packs/day for 45.0 years (33.8 ttl pk-yrs)   Types: Cigarettes   Start date: 01/2023  Smokeless Tobacco Never  Tobacco Comments   Does not on starting back smoking.    Goals Met:  Independence with exercise equipment Exercise tolerated well No report of concerns or symptoms today Strength training completed today  Goals Unmet:  Not Applicable  Comments: Pt able to follow exercise prescription today without complaint.  Will continue to monitor for progression.    Dr. Firman Hughes is Medical Director for [facility] Cardiac Rehabilitation.  Dr. Fuad Aleskerov is Medical Director for [facility] Pulmonary Rehabilitation.

## 2024-04-28 ENCOUNTER — Ambulatory Visit: Attending: Nurse Practitioner | Admitting: Physician Assistant

## 2024-04-28 ENCOUNTER — Encounter: Payer: Self-pay | Admitting: Nurse Practitioner

## 2024-04-28 VITALS — HR 82 | Ht 67.0 in | Wt 225.6 lb

## 2024-04-28 DIAGNOSIS — I10 Essential (primary) hypertension: Secondary | ICD-10-CM

## 2024-04-28 DIAGNOSIS — Z79899 Other long term (current) drug therapy: Secondary | ICD-10-CM

## 2024-04-28 DIAGNOSIS — I2510 Atherosclerotic heart disease of native coronary artery without angina pectoris: Secondary | ICD-10-CM | POA: Diagnosis not present

## 2024-04-28 DIAGNOSIS — Z87898 Personal history of other specified conditions: Secondary | ICD-10-CM

## 2024-04-28 DIAGNOSIS — Z952 Presence of prosthetic heart valve: Secondary | ICD-10-CM | POA: Diagnosis not present

## 2024-04-28 DIAGNOSIS — I350 Nonrheumatic aortic (valve) stenosis: Secondary | ICD-10-CM

## 2024-04-28 DIAGNOSIS — R202 Paresthesia of skin: Secondary | ICD-10-CM | POA: Diagnosis not present

## 2024-04-28 DIAGNOSIS — I951 Orthostatic hypotension: Secondary | ICD-10-CM

## 2024-04-28 DIAGNOSIS — E785 Hyperlipidemia, unspecified: Secondary | ICD-10-CM

## 2024-04-28 NOTE — Progress Notes (Signed)
 Cardiology Office Note    Date:  04/28/2024   ID:  Amanda [name], DOB 12-20-1950, MRN [id number]  PCP:  Scherrie Curt, MD  Cardiologist:  Antionette Kirks, MD  Electrophysiologist:  None   Chief Complaint: Pre-syncope  History of Present Illness:   Amanda [name] is a 73 y.o. female with history of nonobstructive CAD by LHC most recently 09/2023, moderate to severe aortic valve stenosis s/p TAVR in 12/2023, diet-controlled diabetes, COPD, hypertension, hyperlipidemia, ongoing tobacco use, mild positional OSA, and mild cognitive impairment followed by neurology who presents for evaluation of pre-syncope.    Coronary CTA 08/2018 showed calcium  score 521 with moderate nonobstructive disease involving the LAD and RCA that were not significant by CT FFR.  Echo 08/2019 demonstrated EF 60 to 65% with moderate aortic stenosis with mean gradient of 19 mmHg and valve area 1.28 cm.  Repeat echo 11/2020 showed EF 60 to 65%, G1 DD, and moderate aortic stenosis with mean gradient of 22 mmHg and valve area 1.29 cm.  She was admitted to the hospital 11/2020 with chest pain and worsening exertional dyspnea.  R/LHC showed mild nonobstructive CAD with RHC showing normal filling pressures, normal pulmonary pressure, and normal cardiac output.  Aortic stenosis is moderate with a mean gradient of 24 mmHg and valve area of 1.44 cm.  Carotid artery ultrasound 06/2021 showed 1 to 39% bilateral ICA stenosis with antegrade flow of the bilateral vertebral arteries and normal flow hemodynamics of the bilateral subclavian arteries.  She fell 07/2021 leading to broken ribs.  She was admitted 11/2021 with right hand cellulitis/abscess due to infected cat bite.  Echo during that admission showed EF 65 to 70%, no RWMA, mild LVH, normal LV diastolic function parameters, normal RV systolic function and size, moderate aortic valve stenosis with mean gradient of 29.2 mmHg.  She was seen 07/2022.  Dementia at the prior visit that she  had 2 episodes of chest discomfort that improved with rest and sublingual nitroglycerin .  She also noted some lower extremity heaviness with ambulation.  Lexiscan  MPI 07/2022 showed no evidence of ischemia or infarction with normal LVEF and overall is low risk.  ABI 08/2022 was normal bilaterally.  Echo 11/2022 showed EF 65 to 70%, no RWMA, mild LVH, G1 DD, mild aortic insufficiency, moderate aortic stenosis with mean gradient 30 mmHg and valve area 1.53 cm, and an estimated RA pressure of 8 mmHg.  She was seen in office 08/2023 and reported prior dizziness and lightheadedness improved with gait stability and physical therapy.  Echo 09/2023 showed EF 60 to 65%, no RWMA, mild LVH, G1 DD, normal RV systolic function and size, mild MR, severe aortic stenosis mean gradient 36 mmHg and valve area 0.7 cm, mild aortic insufficiency, and RA pressure 8 mmHg.  When compared to echo from 11/2022, her aortic stenosis had progressed.  She was seen in follow-up 09/2023 and reported increased shortness of breath with exertion that improves with rest.  R/LHC on 09/2023 showed mild nonobstructive disease, moderate to severe aortic stenosis with mean gradient 32 mmHg valve area 1.1 cm, minimal pulmonary hypertension, and normal cardiac output.  TAVR evaluation was recommended.  She was evaluated by the structural heart team 10/2023 and underwent TAVR on 12/21/2023.  Postoperative echo showed preserved LV systolic function with normal functioning TAVR.  Subsequent Zio patch did not show any evidence of high-grade AV block.  PYP scan negative.  Echo 01/2024 showed EF 65 to 70%, no RWMA, mild concentric LVH, G1 DD,  normal RV function and size, mildly dilated left atrium, trivial MR, and normal structure and function, TAVR with mean gradient of 6 mmHg.  She was admitted to the hospital on 03/29/2024 through 03/31/2024 with acute hypoxic respiratory secondary to COPD exacerbation and diastolic heart failure with admission also notable  for demand ischemia in the setting of hypoxia.  BNP normal at 39.  She had symptomatic improvement with IV diuresis, nebulizers, and steroids.  She was discharged home on furosemide  40 mg daily.   Patient was most recently seen in follow-up 04/05/2024 and was overall doing well from a cardiac perspective without symptoms of angina or cardiac decompensation.  She continued to note intermittent shortness of breath and oxygen saturations dropping into the low 90s with ambulation.  She reported consuming a diet higher in sodium and drinking greater than 2 L of liquid daily.  Notably her weight was up 4 pounds from her previous visit in 10/2023.  No medication changes were indicated at that time.  Repeat echo was ordered and is scheduled for 05/02/2024.  Patient's daughter called our office reporting that patient was feeling unwell and wondering if it was related to her new medicines.  She reported having some dizziness during the daytime and her daughter tried adjusting her medications to take some in the evening.  She also notes that the patient has been complaining of "feeling all over her body that feels like pins sticking to her."  She asked to be seen for further evaluation.  Today, patient reports that she has not fully recovered since being discharged from the hospital about 1 month ago.  She reports significant lightheadedness/dizziness especially with moving from a sitting to standing position.  This has been worse for the past couple weeks.  She also notes that her mouth has been extremely dry for the past several days.  She has been attending cardiac rehab which is going well but causes her to be fatigued for the rest of the day.  She reports ongoing dyspnea although it is improving with rehab.  She also notes a new sensation for the past couple of days of "pins-and-needles tingling" in her arms and legs.  These episodes are intermittent and lasts for a few minutes before spontaneously resolving.  She  denies chest pain, lower extremity swelling, orthopnea, and PND.  She has not had any episodes of syncope.  She denies bleeding, melena, and hematochezia.  Labs independently reviewed: 04/19/2024-BUN 9, CR 0.85, NA 141, K3.8 04/05/2024-TC 141, TG 211, HDL 60, LDL 48  Past Medical History:  Diagnosis Date   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 sting    Aortic stenosis    a. 2013: nl LV sys fxn, mild MR, no evidence of pulm htn; b. TTE 8/17: EF 60-65%, no RWMA, nl LV dia fxn,  mild AS, mod AI   CAD (coronary artery disease), native coronary artery 11/26/2020   Controlled type 2 diabetes mellitus with diabetic autonomic neuropathy, without long-term current use of insulin  (HCC) 08/10/2007   COPD (chronic obstructive pulmonary disease) (HCC) 10/30/2016   Depression    GAD (generalized anxiety disorder)    GERD (gastroesophageal reflux disease)    Hiatal hernia with gastroesophageal reflux 1997   Hyperlipidemia    Hypertension    Osteoporosis    S/P TAVR (transcatheter aortic valve replacement) 12/21/2023   s/p TAVR with a 34 mm Evolut FX via the TF approach by Dr. Lorie Rook & Dr. Honey Lusty   Tobacco abuse     Current Medications: Current Meds  Medication Sig   acetaminophen  (TYLENOL ) 500 MG tablet Take 1,000 mg by mouth every 8 (eight) hours as needed for mild pain (pain score 1-3) or moderate pain (pain score 4-6).   albuterol  (VENTOLIN  HFA) 108 (90 Base) MCG/ACT inhaler Inhale 2 puffs into the lungs every 4 (four) hours as needed for wheezing or shortness of breath.   amitriptyline  (ELAVIL ) 50 MG tablet TAKE 1/2 (ONE-HALF) TABLET BY MOUTH AT BEDTIME   Ascorbic Acid  (VITAMIN C ) 1000 MG tablet Take 1,000 mg by mouth daily.   aspirin  EC 81 MG tablet Take 81 mg by mouth daily.   buPROPion  (WELLBUTRIN  XL) 150 MG 24 hr tablet Take 1 tablet (150 mg total) by mouth daily.   calcium  carbonate (OS-CAL - DOSED IN MG OF ELEMENTAL CALCIUM ) 1250 (500 Ca) MG tablet Take 1 tablet by mouth daily at 6 (six) AM.   calcium   carbonate (TUMS - DOSED IN MG ELEMENTAL CALCIUM ) 500 MG chewable tablet Chew 2 tablets by mouth daily as needed for indigestion or heartburn.   Cholecalciferol  (VITAMIN D ) 1000 UNITS capsule Take 2,000 Units by mouth daily.   clonazePAM  (KLONOPIN ) 0.5 MG tablet Take 1 tablet by mouth twice daily as needed   cyanocobalamin  (VITAMIN B12) 1000 MCG/ML injection Inject into the muscle.   cyclobenzaprine  (FLEXERIL ) 10 MG tablet Take 1 tablet by mouth three times daily as needed for muscle spasm   dimenhyDRINATE (DRAMAMINE) 50 MG tablet Take 50 mg by mouth every 8 (eight) hours as needed for nausea or dizziness.   donepezil  (ARICEPT ) 5 MG tablet TAKE 1 TABLET BY MOUTH AT BEDTIME   empagliflozin  (JARDIANCE ) 10 MG TABS tablet Take 1 tablet (10 mg total) by mouth daily.   ezetimibe  (ZETIA ) 10 MG tablet Take 1 tablet by mouth once daily   FLUoxetine  (PROZAC ) 40 MG capsule Take 80 mg by mouth M, W, F and 40 mg by mouth Tues, Thurs, Sat and Sunday   fluticasone  (FLONASE) 50 MCG/ACT nasal spray Place 2 sprays into both nostrils daily as needed for rhinitis.   gabapentin  (NEURONTIN ) 300 MG capsule Take 300 mg by mouth daily.   guaiFENesin  (MUCINEX ) 600 MG 12 hr tablet Take 600 mg by mouth 2 (two) times daily as needed for to loosen phlegm.   magnesium  oxide (MAG-OX) 400 (240 Mg) MG tablet Take 400 mg by mouth daily.   metoprolol  tartrate (LOPRESSOR ) 25 MG tablet TAKE 1/2 TABLET TWICE DAILY   Multiple Vitamin (MULTIVITAMIN) tablet Take 1 tablet by mouth daily.   naproxen  sodium (ALEVE ) 220 MG tablet Take 220 mg by mouth in the morning.   pantoprazole  (PROTONIX ) 40 MG tablet TAKE 1 TABLET 30 MINUTES PRIOR TO BREAKFAST   rosuvastatin  (CRESTOR ) 40 MG tablet Take 1 tablet by mouth once daily   Simethicone  125 MG CAPS Take 125 mg by mouth daily as needed (gas).   vitamin E 180 MG (400 UNITS) capsule Take 400 Units by mouth daily.   [DISCONTINUED] furosemide  (LASIX ) 20 MG tablet Take 1 tablet (20 mg total) by mouth  every other day.   [DISCONTINUED] spironolactone  (ALDACTONE ) 25 MG tablet Take 1 tablet (25 mg total) by mouth daily.    Allergies:   Honey bee venom [bee venom] and Diazepam   Social History   Socioeconomic History   Marital status: Married    Spouse name: Not on file   Number of children: 2   Years of education: Not on file   Highest education level: Some college, no degree  Occupational History  Occupation: Insurance claims handler: [employer]  Tobacco Use   Smoking status: Former    Average packs/day: 0.8 packs/day for 45.0 years (33.8 ttl pk-yrs)    Types: Cigarettes    Start date: 01/2023   Smokeless tobacco: Never   Tobacco comments:    Does not on starting back smoking.  Vaping Use   Vaping status: Never Used  Substance and Sexual Activity   Alcohol use: Yes    Alcohol/week: 0.0 standard drinks of alcohol    Comment: glass of wine twice a month    Drug use: No   Sexual activity: Not Currently  Other Topics Concern   Not on file  Social History Narrative   Not on file   Social Drivers of Health   Financial Resource Strain: High Risk (02/21/2024)   Overall Financial Resource Strain (CARDIA)    Difficulty of Paying Living Expenses: Hard  Food Insecurity: No Food Insecurity (03/29/2024)   Hunger Vital Sign    Worried About Running Out of Food in the Last Year: Never true    Ran Out of Food in the Last Year: Never true  Recent Concern: Food Insecurity - Food Insecurity Present (02/21/2024)   Hunger Vital Sign    Worried About Running Out of Food in the Last Year: Sometimes true    Ran Out of Food in the Last Year: Never true  Transportation Needs: No Transportation Needs (03/29/2024)   PRAPARE - Administrator, Civil Service (Medical): No    Lack of Transportation (Non-Medical): No  Physical Activity: Unknown (02/21/2024)   Exercise Vital Sign    Days of Exercise per Week: 0 days    Minutes of Exercise per Session:  Not on file  Stress: Stress Concern Present (02/21/2024)   [employer] of Occupational Health - Occupational Stress Questionnaire    Feeling of Stress : Very much  Social Connections: Moderately Isolated (03/29/2024)   Social Connection and Isolation Panel [NHANES]    Frequency of Communication with Friends and Family: Twice a week    Frequency of Social Gatherings with Friends and Family: Once a week    Attends Religious Services: 1 to 4 times per year    Active Member of [employer] or Organizations: No    Attends Banker Meetings: Patient declined    Marital Status: Widowed     Family History:  The patient's family history includes Arthritis in her mother; Breast cancer in her sister; Cancer in her paternal grandmother; Colon polyps in her mother and sister; Diabetes in her mother; Heart disease in her mother; Hypercholesterolemia in her sister; Hyperlipidemia in her mother. There is no history of Colon cancer, Esophageal cancer, Rectal cancer, Stomach cancer, or Pancreatic cancer.  ROS:   12-point review of systems is negative unless otherwise noted in the HPI.   EKGs/Labs/Other Studies Reviewed:    Studies reviewed were summarized above. The additional studies were reviewed today:  01/21/2024 Echo complete 1. Left ventricular ejection fraction, by estimation, is 65 to 70%. Left  ventricular ejection fraction by 3D volume is 60 %. The left ventricle has  normal function. The left ventricle has no regional wall motion  abnormalities. There is mild concentric  left ventricular hypertrophy. Left ventricular diastolic parameters are  consistent with Grade I diastolic dysfunction (impaired relaxation). The  average left ventricular global longitudinal strain is -19.2 %. The global  longitudinal strain is normal.   2. Right ventricular systolic function is normal.  The right ventricular  size is normal.   3. Left atrial size was mildly dilated.   4. The mitral valve is  normal in structure. Trivial mitral valve  regurgitation. No evidence of mitral stenosis.   5. The aortic valve is normal in structure. Aortic valve regurgitation is  not visualized. No aortic stenosis is present. There is a 34 Medtronic  CoreValve-Evolut Pro prosthetic (TAVR) valve present in the aortic  position. Procedure Date: 12/21/2023.  Aortic valve area, by VTI measures 3.19 cm. Aortic valve mean gradient  measures 6.0 mmHg. Aortic valve Vmax measures 1.74 m/s.   6. The inferior vena cava is normal in size with greater than 50%  respiratory variability, suggesting right atrial pressure of 3 mmHg.   EKG:  EKG is ordered today.  The EKG ordered today demonstrates sinus rhythm with right bundle branch block, rate 79 bpm.  Recent Labs: 03/29/2024: ALT 14; B Natriuretic Peptide 39.2 03/31/2024: Hemoglobin 12.1; Magnesium  2.4; Platelets 172 04/19/2024: BUN 9; Creatinine, Ser 0.85; Potassium 3.8; Sodium 141  Recent Lipid Panel    Component Value Date/Time   CHOL 141 04/05/2024 1625   TRIG 211 (H) 04/05/2024 1625   HDL 60 04/05/2024 1625   CHOLHDL 2.4 04/05/2024 1625   CHOLHDL 2 01/28/2023 1158   VLDL 18.8 01/28/2023 1158   LDLCALC 48 04/05/2024 1625   LDLDIRECT 209.2 05/28/2009 0909    PHYSICAL EXAM:    VS:  Pulse 82   Ht 5\' 7"  (1.702 m)   Wt 225 lb 9.6 oz (102.3 kg)   SpO2 99%   BMI 35.33 kg/m   BMI: Body mass index is 35.33 kg/m.  Orthostatic VS for the past 24 hrs:  BP- Lying Pulse- Lying BP- Sitting Pulse- Sitting BP- Standing at 0 minutes Pulse- Standing at 0 minutes  04/28/24 1456 137/71 79 101/63 82 93/56 88       Physical Exam Vitals and nursing note reviewed.  Constitutional:      General: She is not in acute distress.    Appearance: Normal appearance.  Cardiovascular:     Rate and Rhythm: Normal rate and regular rhythm.     Heart sounds: No murmur heard. Pulmonary:     Effort: Pulmonary effort is normal. No respiratory distress.     Breath sounds: No  wheezing or rales.  Musculoskeletal:     Right lower leg: No edema.     Left lower leg: No edema.  Skin:    General: Skin is warm and dry.  Neurological:     General: No focal deficit present.     Mental Status: She is alert and oriented to person, place, and time. Mental status is at baseline.  Psychiatric:        Mood and Affect: Mood normal.        Behavior: Behavior normal.     Wt Readings from Last 3 Encounters:  04/28/24 225 lb 9.6 oz (102.3 kg)  04/05/24 229 lb (103.9 kg)  04/05/24 227 lb (103 kg)     ASSESSMENT & PLAN:   Orthostatic hypotension - Patient reports several weeks of lightheadedness/dizziness associated with positional changes.  Orthostatics in clinic today are positive.  Patient appears euvolemic on exam.  Given concern for symptomatic orthostatic hypotension, recommend holding Lasix  and spironolactone  at this time.  Check CBC and BMP.  Paresthesias - Patient reports upper and lower extremity tingling which has been present for the past week.  She did recently undergo nerve study and wonders if this  could be related. Check BMP to assess for electrolyte abnormalities.  History of acute hypoxic respiratory distress - Recent admission 03/2024 likely multifactorial in the setting of COPD exacerbation and volume overload.  Symptoms continue to improve.  Repeat echo scheduled for early next week.  She remains on Jardiance  10 mg.  Recommend holding Lasix  and spironolactone  as above.  Nonobstructive CAD - Recent LHC 09/2023 with minimal nonobstructive CAD.  Continue primary prevention with aspirin  and statin.  Moderate to severe aortic valve stenosis - S/p B TAVR.  Currently attending cardiac rehab.  Remains on aspirin .  Hyperlipidemia - Most recent lipid panel 03/2024 with LDL 48.  Continue statin.   Disposition: F/u with Varney Gentleman as scheduled on 07/05/2024.   Medication Adjustments/Labs and Tests Ordered: Current medicines are reviewed at length with the  patient today.  Concerns regarding medicines are outlined above. Medication changes, Labs and Tests ordered today are summarized above and listed in the Patient Instructions accessible in Encounters.   Beather Liming, PA-C 04/28/2024 5:17 PM     [facility] -  [address] [place], [place] [address] [phone]

## 2024-04-28 NOTE — Patient Instructions (Signed)
 Medication Instructions:  Your physician recommends the following medication changes.  STOP TAKING: Lasix  Spironolactone    *If you need a refill on your cardiac medications before your next appointment, please call your pharmacy*  Lab Work: Your provider would like for you to have following labs drawn today (CBC, BMP).    Testing/Procedures: No test ordered today   Follow-Up: At [facility], you and your health needs are our priority.  As part of our continuing mission to provide you with exceptional heart care, our providers are all part of one team.  This team includes your primary Cardiologist (physician) and Advanced Practice Providers or APPs (Physician Assistants and Nurse Practitioners) who all work together to provide you with the care you need, when you need it.  Your next appointment:   July 05, 2024 @ 1:30 pm   Provider:   Varney Gentleman, PA-C

## 2024-04-29 LAB — BASIC METABOLIC PANEL WITH GFR
BUN/Creatinine Ratio: 14 (ref 12–28)
BUN: 13 mg/dL (ref 8–27)
CO2: 21 mmol/L (ref 20–29)
Calcium: 9.5 mg/dL (ref 8.7–10.3)
Chloride: 99 mmol/L (ref 96–106)
Creatinine, Ser: 0.95 mg/dL (ref 0.57–1.00)
Glucose: 90 mg/dL (ref 70–99)
Potassium: 4.9 mmol/L (ref 3.5–5.2)
Sodium: 135 mmol/L (ref 134–144)
eGFR: 63 mL/min/{1.73_m2} (ref >59)

## 2024-04-29 LAB — CBC
Hematocrit: 40.7 % (ref 34.0–46.6)
Hemoglobin: 13.5 g/dL (ref 11.1–15.9)
MCH: 32.8 pg (ref 26.6–33.0)
MCHC: 33.2 g/dL (ref 31.5–35.7)
MCV: 99 fL — ABNORMAL HIGH (ref 79–97)
Platelets: 194 10*3/uL (ref 150–450)
RBC: 4.11 x10E6/uL (ref 3.77–5.28)
RDW: 11.5 % — ABNORMAL LOW (ref 11.7–15.4)
WBC: 7.7 10*3/uL (ref 3.4–10.8)

## 2024-05-02 ENCOUNTER — Ambulatory Visit: Payer: Self-pay | Admitting: Physician Assistant

## 2024-05-02 ENCOUNTER — Other Ambulatory Visit: Payer: Self-pay | Admitting: Family Medicine

## 2024-05-02 ENCOUNTER — Ambulatory Visit: Attending: Physician Assistant

## 2024-05-02 DIAGNOSIS — R0602 Shortness of breath: Secondary | ICD-10-CM | POA: Diagnosis not present

## 2024-05-02 DIAGNOSIS — Z952 Presence of prosthetic heart valve: Secondary | ICD-10-CM | POA: Insufficient documentation

## 2024-05-02 LAB — ECHOCARDIOGRAM COMPLETE
AR max vel: 2.64 cm2
AV Area VTI: 2.7 cm2
AV Area mean vel: 2.6 cm2
AV Mean grad: 6.3 mmHg
AV Peak grad: 11.9 mmHg
Ao pk vel: 1.73 m/s
Area-P 1/2: 2.76 cm2
Calc EF: 60.6 %
S' Lateral: 2.4 cm
Single Plane A2C EF: 56.8 %
Single Plane A4C EF: 59.9 %

## 2024-05-03 ENCOUNTER — Ambulatory Visit: Payer: Self-pay | Admitting: Physician Assistant

## 2024-05-03 ENCOUNTER — Encounter: Admitting: *Deleted

## 2024-05-03 DIAGNOSIS — Z952 Presence of prosthetic heart valve: Secondary | ICD-10-CM | POA: Diagnosis not present

## 2024-05-03 NOTE — Progress Notes (Signed)
 Daily Session Note  Patient Details  Name: Amanda [name] MRN: [id number] Date of Birth: 05-08-1951 Referring Provider:   Flowsheet Row Cardiac Rehab from 02/21/2024 in [facility] Cardiac [facility] Pulmonary Rehab  Referring Provider Dr. Antionette Kirks, MD       Encounter Date: 05/03/2024  Check In:  Session Check In - 05/03/24 1341       Check-In   Supervising physician immediately available to respond to emergencies See telemetry face sheet for immediately available ER MD    Location [facility]-Cardiac & Pulmonary Rehab    Staff Present Sue Em RN,BSN;Kelly Sabra Cramp BS, ACSM CEP, Exercise Physiologist;Susanne Bice, RN, BSN, CCRP;Noah Tickle, BS, Exercise Physiologist    Virtual Visit No    Medication changes reported     Yes    Comments holding spirinolactone and lasix     Fall or balance concerns reported    Yes    Comments fell Thursday, evaluated by doctors    Tobacco Cessation No Change    Warm-up and Cool-down Performed on first and last piece of equipment    Resistance Training Performed Yes    VAD Patient? No    PAD/SET Patient? No      Pain Assessment   Currently in Pain? No/denies                Social History   Tobacco Use  Smoking Status Former   Average packs/day: 0.8 packs/day for 45.0 years (33.8 ttl pk-yrs)   Types: Cigarettes   Start date: 01/2023  Smokeless Tobacco Never  Tobacco Comments   Does not on starting back smoking.    Goals Met:  Independence with exercise equipment Exercise tolerated well No report of concerns or symptoms today Strength training completed today  Goals Unmet:  Not Applicable  Comments: Pt able to follow exercise prescription today without complaint.  Will continue to monitor for progression.    Dr. Firman Hughes is Medical Director for [facility] Cardiac Rehabilitation.  Dr. Fuad Aleskerov is Medical Director for [facility] Pulmonary Rehabilitation.

## 2024-05-04 ENCOUNTER — Encounter: Admitting: *Deleted

## 2024-05-04 DIAGNOSIS — Z952 Presence of prosthetic heart valve: Secondary | ICD-10-CM | POA: Diagnosis not present

## 2024-05-04 NOTE — Progress Notes (Signed)
 Daily Session Note  Patient Details  Name: Amanda [name] MRN: [id number] Date of Birth: [birth year] Referring Provider:   Flowsheet Row Cardiac Rehab from 02/21/2024 in [facility] Cardiac and Pulmonary Rehab  Referring Provider Dr. Antionette Kirks, MD       Encounter Date: 05/04/2024  Check In:  Session Check In - 05/04/24 1338       Check-In   Supervising physician immediately available to respond to emergencies See telemetry face sheet for immediately available ER MD    Location [facility]-Cardiac & Pulmonary Rehab    Staff Present Sue Em RN,BSN;[facility] BS, Exercise Physiologist;Noah Tickle, BS, Exercise Physiologist    Virtual Visit No    Medication changes reported     No    Fall or balance concerns reported    No    Warm-up and Cool-down Performed on first and last piece of equipment    Resistance Training Performed Yes    VAD Patient? No    PAD/SET Patient? No      Pain Assessment   Currently in Pain? No/denies                Social History   Tobacco Use  Smoking Status Former   Average packs/day: 0.8 packs/day for 45.0 years (33.8 ttl pk-yrs)   Types: Cigarettes   Start date: 01/2023  Smokeless Tobacco Never  Tobacco Comments   Does not on starting back smoking.    Goals Met:  Independence with exercise equipment Exercise tolerated well No report of concerns or symptoms today Strength training completed today  Goals Unmet:  Not Applicable  Comments: Pt able to follow exercise prescription today without complaint.  Will continue to monitor for progression.    Dr. Firman Hughes is Medical Director for [facility] Cardiac Rehabilitation.  Dr. Fuad Aleskerov is Medical Director for [facility] Pulmonary Rehabilitation.

## 2024-05-08 ENCOUNTER — Encounter

## 2024-05-09 ENCOUNTER — Ambulatory Visit

## 2024-05-10 ENCOUNTER — Encounter: Payer: Self-pay | Admitting: *Deleted

## 2024-05-10 ENCOUNTER — Encounter: Attending: Cardiovascular Disease | Admitting: *Deleted

## 2024-05-10 ENCOUNTER — Encounter

## 2024-05-10 ENCOUNTER — Ambulatory Visit: Admitting: Podiatry

## 2024-05-10 DIAGNOSIS — Z48812 Encounter for surgical aftercare following surgery on the circulatory system: Secondary | ICD-10-CM | POA: Insufficient documentation

## 2024-05-10 DIAGNOSIS — J449 Chronic obstructive pulmonary disease, unspecified: Secondary | ICD-10-CM | POA: Diagnosis not present

## 2024-05-10 DIAGNOSIS — Z5189 Encounter for other specified aftercare: Secondary | ICD-10-CM | POA: Insufficient documentation

## 2024-05-10 DIAGNOSIS — F17200 Nicotine dependence, unspecified, uncomplicated: Secondary | ICD-10-CM | POA: Diagnosis not present

## 2024-05-10 DIAGNOSIS — Z952 Presence of prosthetic heart valve: Secondary | ICD-10-CM

## 2024-05-10 NOTE — Progress Notes (Signed)
 Cardiac Individual Treatment Plan  Patient Details  Name: Amanda [name] MRN: [id number] Date of Birth: 15-Oct-1951 Referring Provider:   Flowsheet Row Cardiac Rehab from 02/21/2024 in [facility] Cardiac and Pulmonary Rehab  Referring Provider Dr. Antionette Kirks, MD       Initial Encounter Date:  Flowsheet Row Cardiac Rehab from 02/21/2024 in [facility] Cardiac and Pulmonary Rehab  Date 02/21/24       Visit Diagnosis: S/P TAVR (transcatheter aortic valve replacement)  Patient's Home Medications on Admission:  Current Outpatient Medications:    acetaminophen  (TYLENOL ) 500 MG tablet, Take 1,000 mg by mouth every 8 (eight) hours as needed for mild pain (pain score 1-3) or moderate pain (pain score 4-6)., Disp: , Rfl:    albuterol  (VENTOLIN  HFA) 108 (90 Base) MCG/ACT inhaler, Inhale 2 puffs into the lungs every 4 (four) hours as needed for wheezing or shortness of breath., Disp: 3 each, Rfl: 2   amitriptyline  (ELAVIL ) 50 MG tablet, TAKE 1/2 (ONE-HALF) TABLET BY MOUTH AT BEDTIME, Disp: 45 tablet, Rfl: 1   Ascorbic Acid  (VITAMIN C ) 1000 MG tablet, Take 1,000 mg by mouth daily., Disp: , Rfl:    aspirin  EC 81 MG tablet, Take 81 mg by mouth daily., Disp: , Rfl:    budesonide  (PULMICORT ) 0.5 MG/2ML nebulizer solution, Take 2 mLs (0.5 mg total) by nebulization 2 (two) times daily. (Patient not taking: Reported on 04/28/2024), Disp: 360 mL, Rfl: 3   buPROPion  (WELLBUTRIN  XL) 150 MG 24 hr tablet, Take 1 tablet (150 mg total) by mouth daily., Disp: 90 tablet, Rfl: 1   calcium  carbonate (OS-CAL - DOSED IN MG OF ELEMENTAL CALCIUM ) 1250 (500 Ca) MG tablet, Take 1 tablet by mouth daily at 6 (six) AM., Disp: , Rfl:    calcium  carbonate (TUMS - DOSED IN MG ELEMENTAL CALCIUM ) 500 MG chewable tablet, Chew 2 tablets by mouth daily as needed for indigestion or heartburn., Disp: , Rfl:    Cholecalciferol  (VITAMIN D ) 1000 UNITS capsule, Take 2,000 Units by mouth daily., Disp: , Rfl:    clonazePAM  (KLONOPIN ) 0.5 MG tablet,  Take 1 tablet by mouth twice daily as needed, Disp: 60 tablet, Rfl: 5   cyanocobalamin  (VITAMIN B12) 1000 MCG/ML injection, Inject into the muscle., Disp: , Rfl:    cyclobenzaprine  (FLEXERIL ) 10 MG tablet, Take 1 tablet by mouth three times daily as needed for muscle spasm, Disp: 60 tablet, Rfl: 3   dimenhyDRINATE (DRAMAMINE) 50 MG tablet, Take 50 mg by mouth every 8 (eight) hours as needed for nausea or dizziness., Disp: , Rfl:    donepezil  (ARICEPT ) 5 MG tablet, TAKE 1 TABLET BY MOUTH AT BEDTIME, Disp: 90 tablet, Rfl: 3   empagliflozin  (JARDIANCE ) 10 MG TABS tablet, Take 1 tablet (10 mg total) by mouth daily., Disp: 30 tablet, Rfl: 11   ezetimibe  (ZETIA ) 10 MG tablet, Take 1 tablet by mouth once daily, Disp: 90 tablet, Rfl: 3   FLUoxetine  (PROZAC ) 40 MG capsule, Take 80 mg by mouth M, W, F and 40 mg by mouth Tues, Thurs, Sat and Sunday, Disp: 102 capsule, Rfl: 1   fluticasone  (FLONASE) 50 MCG/ACT nasal spray, Place 2 sprays into both nostrils daily as needed for rhinitis., Disp: , Rfl:    gabapentin  (NEURONTIN ) 300 MG capsule, Take 300 mg by mouth daily., Disp: , Rfl:    guaiFENesin  (MUCINEX ) 600 MG 12 hr tablet, Take 600 mg by mouth 2 (two) times daily as needed for to loosen phlegm., Disp: , Rfl:    ipratropium-albuterol  (DUONEB)  0.5-2.5 (3) MG/3ML SOLN, Take 3 mLs by nebulization every 6 (six) hours as needed. (Patient not taking: Reported on 04/28/2024), Disp: 360 mL, Rfl: 3   magnesium  oxide (MAG-OX) 400 (240 Mg) MG tablet, Take 400 mg by mouth daily., Disp: , Rfl:    metoprolol  tartrate (LOPRESSOR ) 25 MG tablet, TAKE 1/2 TABLET TWICE DAILY, Disp: 90 tablet, Rfl: 1   Multiple Vitamin (MULTIVITAMIN) tablet, Take 1 tablet by mouth daily., Disp: , Rfl:    naproxen  sodium (ALEVE ) 220 MG tablet, Take 220 mg by mouth in the morning., Disp: , Rfl:    pantoprazole  (PROTONIX ) 40 MG tablet, TAKE 1 TABLET 30 MINUTES PRIOR TO BREAKFAST, Disp: 90 tablet, Rfl: 3   rosuvastatin  (CRESTOR ) 40 MG tablet, Take 1  tablet by mouth once daily, Disp: 90 tablet, Rfl: 3   Simethicone  125 MG CAPS, Take 125 mg by mouth daily as needed (gas)., Disp: , Rfl:    vitamin E 180 MG (400 UNITS) capsule, Take 400 Units by mouth daily., Disp: , Rfl:   Past Medical History: Past Medical History:  Diagnosis Date   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 sting    Aortic stenosis    a. 2013: nl LV sys fxn, mild MR, no evidence of pulm htn; b. TTE 8/17: EF 60-65%, no RWMA, nl LV dia fxn,  mild AS, mod AI   CAD (coronary artery disease), native coronary artery 11/26/2020   Controlled type 2 diabetes mellitus with diabetic autonomic neuropathy, without long-term current use of insulin  (HCC) 08/10/2007   COPD (chronic obstructive pulmonary disease) (HCC) 10/30/2016   Depression    GAD (generalized anxiety disorder)    GERD (gastroesophageal reflux disease)    Hiatal hernia with gastroesophageal reflux 1997   Hyperlipidemia    Hypertension    Osteoporosis    S/P TAVR (transcatheter aortic valve replacement) 12/21/2023   s/p TAVR with a 34 mm Evolut FX via the TF approach by Dr. Lorie Rook & Dr. Honey Lusty   Tobacco abuse     Tobacco Use: Social History   Tobacco Use  Smoking Status Former   Average packs/day: 0.8 packs/day for 45.0 years (33.8 ttl pk-yrs)   Types: Cigarettes   Start date: 01/2023  Smokeless Tobacco Never  Tobacco Comments   Does not on starting back smoking.    Labs: Review Flowsheet  More data exists      Latest Ref Rng & Units 01/28/2023 10/04/2023 12/21/2023 03/29/2024 04/05/2024  Labs for ITP Cardiac and Pulmonary Rehab  Cholestrol 100 - 199 mg/dL 161  - - - 096   LDL (calc) 0 - 99 mg/dL 57  - - - 48   HDL-C >04 mg/dL 54.09  - - - 60   Trlycerides 0 - 149 mg/dL 81.1  - - - 914   Hemoglobin A1c 4.8 - 5.6 % 6.4  - - 6.9  -  PH, Arterial 7.35 - 7.45 - 7.353  - 7.37  -  PCO2 arterial 32 - 48 mmHg - 44.6  - 46  -  Bicarbonate 20.0 - 28.0 mmol/L - 25.2  24.8  - 26.6  -  TCO2 22 - 32 mmol/L - 27  26  26   - -  Acid-base  deficit 0.0 - 2.0 mmol/L - 1.0  1.0  - - -  O2 Saturation % - 69  95  - 99.1  -    Details       Multiple values from one day are sorted in reverse-chronological order  Exercise Target Goals: Exercise Program Goal: Individual exercise prescription set using results from initial 6 min walk test and THRR while considering  patient's activity barriers and safety.   Exercise Prescription Goal: Initial exercise prescription builds to 30-45 minutes a day of aerobic activity, 2-3 days per week.  Home exercise guidelines will be given to patient during program as part of exercise prescription that the participant will acknowledge.   Education: Aerobic Exercise: - Group verbal and visual presentation on the components of exercise prescription. Introduces F.I.T.T principle from ACSM for exercise prescriptions.  Reviews F.I.T.T. principles of aerobic exercise including progression. Written material given at graduation.   Education: Resistance Exercise: - Group verbal and visual presentation on the components of exercise prescription. Introduces F.I.T.T principle from ACSM for exercise prescriptions  Reviews F.I.T.T. principles of resistance exercise including progression. Written material given at graduation. Flowsheet Row Cardiac Rehab from 04/26/2024 in [facility] Cardiac and Pulmonary Rehab  Date 04/26/24  Educator [facility]  Instruction Review Code 1- [employer] Understanding        Education: Exercise & Equipment Safety: - Individual verbal instruction and demonstration of equipment use and safety with use of the equipment. Flowsheet Row Cardiac Rehab from 04/26/2024 in [facility] Cardiac and Pulmonary Rehab  Date 02/07/24  Educator [place]  Instruction Review Code 1- Verbalizes Understanding       Education: Exercise Physiology & General Exercise Guidelines: - Group verbal and written instruction with models to review the exercise physiology of the cardiovascular system and associated critical  values. Provides general exercise guidelines with specific guidelines to those with heart or lung disease.  Flowsheet Row Cardiac Rehab from 04/26/2024 in [facility] Cardiac and Pulmonary Rehab  Date 04/19/24  Educator [facility]  Instruction Review Code 1- [employer] Understanding       Education: Flexibility, Balance, Mind/Body Relaxation: - Group verbal and visual presentation with interactive activity on the components of exercise prescription. Introduces F.I.T.T principle from ACSM for exercise prescriptions. Reviews F.I.T.T. principles of flexibility and balance exercise training including progression. Also discusses the mind body connection.  Reviews various relaxation techniques to help reduce and manage stress (i.e. Deep breathing, progressive muscle relaxation, and visualization). Balance handout provided to take home. Written material given at graduation. Flowsheet Row Cardiac Rehab from 04/26/2024 in [facility] Cardiac and Pulmonary Rehab  Date 04/26/24  Educator [facility]  Instruction Review Code 1- Verbalizes Understanding       Activity Barriers & Risk Stratification:  Activity Barriers & Cardiac Risk Stratification - 02/21/24 1558       Activity Barriers & Cardiac Risk Stratification   Activity Barriers History of Falls;Balance Concerns;Left Knee Replacement;Right Knee Replacement;Back Problems;Neck/Spine Problems;Muscular Weakness;Deconditioning;Assistive Device    Cardiac Risk Stratification Low             6 Minute Walk:  6 Minute Walk     Row Name 02/21/24 1557         6 Minute Walk   Phase Initial     Distance 580 feet     Walk Time 6 minutes     # of Rest Breaks 0     MPH 1.1     METS 1     RPE 11     Perceived Dyspnea  1     VO2 Peak 3.21     Symptoms Yes (comment)     Comments leg fatigue, used walker     Resting HR 65 bpm     Resting BP 102/56     Resting Oxygen Saturation  96 %     Exercise Oxygen Saturation  during 6 min walk 94 %     Max Ex. HR 96 bpm     Max  Ex. BP 138/70     2 Minute Post BP 134/68              Oxygen Initial Assessment:   Oxygen Re-Evaluation:   Oxygen Discharge (Final Oxygen Re-Evaluation):   Initial Exercise Prescription:  Initial Exercise Prescription - 02/21/24 1600       Date of Initial Exercise RX and Referring Provider   Date 02/21/24    Referring Provider Dr. Antionette Kirks, MD      Oxygen   Maintain Oxygen Saturation 88% or higher      Recumbant Bike   Level 1    RPM 50    Watts 15    Minutes 15    METs 1      NuStep   Level 1    SPM 80    Minutes 15    METs 1      Track   Laps 15    Minutes 15    METs 1.82      Prescription Details   Frequency (times per week) 3    Duration Progress to 30 minutes of continuous aerobic without signs/symptoms of physical distress      Intensity   THRR 40-80% of Max Heartrate 97-130    Ratings of Perceived Exertion 11-13    Perceived Dyspnea 0-4      Progression   Progression Continue to progress workloads to maintain intensity without signs/symptoms of physical distress.      Resistance Training   Training Prescription Yes    Weight 3 lb    Reps 10-15             Perform Capillary Blood Glucose checks as needed.  Exercise Prescription Changes:   Exercise Prescription Changes     Row Name 02/21/24 1600 03/09/24 0800 03/22/24 1300 04/06/24 1000 04/17/24 1400     Response to Exercise   Blood Pressure (Admit) 102/56 128/60 124/60 122/62 --   Blood Pressure (Exercise) 138/70 144/70 162/80 148/74 --   Blood Pressure (Exit) 134/68 118/62 128/64 116/58 --   Heart Rate (Admit) 65 bpm 82 bpm 103 bpm 106 bpm --   Heart Rate (Exercise) 96 bpm 106 bpm 124 bpm 129 bpm --   Heart Rate (Exit) 78 bpm 79 bpm 93 bpm 111 bpm --   Oxygen Saturation (Admit) 96 % -- -- -- --   Oxygen Saturation (Exercise) 94 % -- -- -- --   Rating of Perceived Exertion (Exercise) 11 14 13 15  --   Perceived Dyspnea (Exercise) 1 -- -- -- --   Symptoms leg fatgue,  used walker none none none --   Comments Results First 2 weeks of exercise sessions -- -- --   Duration -- Continue with 30 min of aerobic exercise without signs/symptoms of physical distress. Continue with 30 min of aerobic exercise without signs/symptoms of physical distress. Continue with 30 min of aerobic exercise without signs/symptoms of physical distress. --   Intensity -- THRR unchanged THRR unchanged THRR unchanged --     Progression   Progression -- Continue to progress workloads to maintain intensity without signs/symptoms of physical distress. Continue to progress workloads to maintain intensity without signs/symptoms of physical distress. Continue to progress workloads to maintain intensity without signs/symptoms of physical distress. --   Average METs -- 2.24 2.5 2.19 --  Resistance Training   Training Prescription -- Yes Yes Yes --   Weight -- 3 lb 3 lb 3 lb --   Reps -- 10-15 10-15 10-15 --     Interval Training   Interval Training -- No No No --     Recumbant Bike   Level -- 2 3 2  --   Watts -- 18 15 15  --   Minutes -- 15 15 15  --   METs -- 2.45 2.44 2 --     NuStep   Level -- 2 4 -- --   Minutes -- 15 15 -- --   METs -- 2.4 2.7 -- --     Biostep-RELP   Level -- -- 2 2 --   Minutes -- -- 15 15 --   METs -- -- 2 3 --     Track   Laps -- -- -- 14 --   Minutes -- -- -- 15 --   METs -- -- -- 1.76 --     Home Exercise Plan   Plans to continue exercise at -- -- -- -- Home (comment)  Walking to mailbox and yard perimeter (increasing duration), exercises for back   Frequency -- -- -- -- Add 2 additional days to program exercise sessions.   Initial Home Exercises Provided -- -- -- -- 04/17/24     Oxygen   Maintain Oxygen Saturation -- 88% or higher 88% or higher 88% or higher --    Row Name 04/20/24 0800 05/04/24 1800           Response to Exercise   Blood Pressure (Admit) 124/62 130/68      Blood Pressure (Exit) 108/54 108/68      Heart Rate  (Admit) 94 bpm 93 bpm      Heart Rate (Exercise) 115 bpm 120 bpm      Heart Rate (Exit) 102 bpm 90 bpm      Oxygen Saturation (Admit) -- 95 %      Oxygen Saturation (Exercise) -- 92 %      Oxygen Saturation (Exit) -- 95 %      Rating of Perceived Exertion (Exercise) 13 15      Symptoms none none      Duration Continue with 30 min of aerobic exercise without signs/symptoms of physical distress. Continue with 30 min of aerobic exercise without signs/symptoms of physical distress.      Intensity THRR unchanged THRR unchanged        Progression   Progression Continue to progress workloads to maintain intensity without signs/symptoms of physical distress. Continue to progress workloads to maintain intensity without signs/symptoms of physical distress.      Average METs 2.32 2.39        Resistance Training   Training Prescription Yes Yes      Weight 4 lb 4 lb      Reps 10-15 10-15        Interval Training   Interval Training No No        Recumbant Bike   Level 2 3      Watts 25 25      Minutes 15 15      METs 2 2        NuStep   Level 3 3      Minutes 15 15      METs 2.6 2.9        T5 Nustep   Level -- 3      SPM -- 80  Minutes -- 15      METs -- 2.1        Biostep-RELP   Level -- 2      Minutes -- 15      METs -- 3        Track   Laps 24 --      Minutes 15 --      METs 2.31 --        Home Exercise Plan   Plans to continue exercise at Home (comment)  Walking to mailbox and yard perimeter (increasing duration), exercises for back Home (comment)  Walking to mailbox and yard perimeter (increasing duration), exercises for back      Frequency Add 2 additional days to program exercise sessions. Add 2 additional days to program exercise sessions.      Initial Home Exercises Provided 04/17/24 04/17/24        Oxygen   Maintain Oxygen Saturation 88% or higher 88% or higher               Exercise Comments:   Exercise Goals and Review:   Exercise Goals     Row  Name 02/21/24 1558             Exercise Goals   Increase Physical Activity Yes       Intervention Provide advice, education, support and counseling about physical activity/exercise needs.;Develop an individualized exercise prescription for aerobic and resistive training based on initial evaluation findings, risk stratification, comorbidities and participant's personal goals.       Expected Outcomes Short Term: Attend rehab on a regular basis to increase amount of physical activity.;Long Term: Exercising regularly at least 3-5 days a week.;Long Term: Add in home exercise to make exercise part of routine and to increase amount of physical activity.       Increase Strength and Stamina Yes       Intervention Provide advice, education, support and counseling about physical activity/exercise needs.;Develop an individualized exercise prescription for aerobic and resistive training based on initial evaluation findings, risk stratification, comorbidities and participant's personal goals.       Expected Outcomes Short Term: Increase workloads from initial exercise prescription for resistance, speed, and METs.;Short Term: Perform resistance training exercises routinely during rehab and add in resistance training at home;Long Term: Improve cardiorespiratory fitness, muscular endurance and strength as measured by increased METs and functional capacity ( )       Able to understand and use rate of perceived exertion (RPE) scale Yes       Intervention Provide education and explanation on how to use RPE scale       Expected Outcomes Short Term: Able to use RPE daily in rehab to express subjective intensity level;Long Term:  Able to use RPE to guide intensity level when exercising independently       Able to understand and use Dyspnea scale Yes       Intervention Provide education and explanation on how to use Dyspnea scale       Expected Outcomes Short Term: Able to use Dyspnea scale daily in rehab to express  subjective sense of shortness of breath during exertion;Long Term: Able to use Dyspnea scale to guide intensity level when exercising independently       Knowledge and understanding of Target Heart Rate Range (THRR) Yes       Intervention Provide education and explanation of THRR including how the numbers were predicted and where they are located for reference  Expected Outcomes Short Term: Able to state/look up THRR;Long Term: Able to use THRR to govern intensity when exercising independently;Short Term: Able to use daily as guideline for intensity in rehab       Able to check pulse independently Yes       Intervention Provide education and demonstration on how to check pulse in carotid and radial arteries.;Review the importance of being able to check your own pulse for safety during independent exercise       Expected Outcomes Short Term: Able to explain why pulse checking is important during independent exercise;Long Term: Able to check pulse independently and accurately       Understanding of Exercise Prescription Yes       Intervention Provide education, explanation, and written materials on patient's individual exercise prescription       Expected Outcomes Short Term: Able to explain program exercise prescription;Long Term: Able to explain home exercise prescription to exercise independently                Exercise Goals Re-Evaluation :  Exercise Goals Re-Evaluation     Row Name 03/09/24 0804 03/22/24 1346 04/06/24 1014 04/17/24 1431 04/20/24 0831     Exercise Goal Re-Evaluation   Exercise Goals Review Increase Physical Activity;Understanding of Exercise Prescription;Increase Strength and Stamina Increase Physical Activity;Understanding of Exercise Prescription;Increase Strength and Stamina Increase Physical Activity;Understanding of Exercise Prescription;Increase Strength and Stamina Increase Physical Activity;Able to understand and use Dyspnea scale;Understanding of Exercise  Prescription;Increase Strength and Stamina;Knowledge and understanding of Target Heart Rate Range (THRR);Able to understand and use rate of perceived exertion (RPE) scale;Able to check pulse independently Increase Physical Activity;Increase Strength and Stamina;Understanding of Exercise Prescription   Comments Placida is off to a good start in the program and completed her first 2 weeks of exercise sessions in this review. She tolerated her exercise prescription well and increased workloads. She increased to level 2 on the recumbent bike and to level 2 on the T4 nustep. We will continue to monitor her progress in the program. Luvina is doing well in the program. She was recently able to increase her level on the recumbent bike from level 2 to 3. She was also able to increase from level 2 to level 4 on the T4 nustep. We will continue to monitor her progress in the program. Audri is doing well in the program. She was able to do level 2 on the biostep and recumbent bike. She also did 14 laps on the track. She had been in the hospital and discharged in this review and has yet to recieve clearance to return to rehab as of 4/28. We will continue to monitor her progress in the program when she is able to return. Reviewed home exercise with pt today.  Pt plans to walk to her mailbox and add in her yard perimeter and increase the duration of this along with her back exercises for exercise. She plans to add in 2 additional days of home exercise. Reviewed THR, pulse, RPE, sign and symptoms, pulse oximetery and when to call 911 or MD.  Also discussed weather considerations and indoor options.  Pt voiced understanding. Ivori is doing well in rehab. She recently increased her number of laps walked on the track to 24 laps. She also increased to 4 lb hand weights for resistance training and continues to work at level 3 on the T4 nustep. We will continue to monitor her progress in the program.   Expected Outcomes Short:  Continue to follow  current exercise prescription. Long: Continue exercise to improve strength and stamina. Short: Continue to increase levels on the T4 nustep and Recumbent bike. Long: Continue exercise to improve strength and stamina. Short: Return to rehab when able. Long: Continue exercise to improve strength and stamina. Short: Add 2 additional days of exercise at home. Long: Continue to exercise independently Short: Continue to push for more laps on the track. Long: Continue exercise to improve strength and stamina.    Row Name 05/04/24 1827             Exercise Goal Re-Evaluation   Exercise Goals Review Increase Physical Activity;Increase Strength and Stamina;Understanding of Exercise Prescription       Comments Opie is doing well in rehab. She is due for her post soon and hopes to see improvement. She increased to level 3 on the recumbent bike. She maintained level 3 on the T4 and T5 nustep and level 2 on the biostep. We will continue to monitor her progress in the program.       Expected Outcomes Short: Improve on post . Long: Continue to increase overall METs and stamina.                Discharge Exercise Prescription (Final Exercise Prescription Changes):  Exercise Prescription Changes - 05/04/24 1800       Response to Exercise   Blood Pressure (Admit) 130/68    Blood Pressure (Exit) 108/68    Heart Rate (Admit) 93 bpm    Heart Rate (Exercise) 120 bpm    Heart Rate (Exit) 90 bpm    Oxygen Saturation (Admit) 95 %    Oxygen Saturation (Exercise) 92 %    Oxygen Saturation (Exit) 95 %    Rating of Perceived Exertion (Exercise) 15    Symptoms none    Duration Continue with 30 min of aerobic exercise without signs/symptoms of physical distress.    Intensity THRR unchanged      Progression   Progression Continue to progress workloads to maintain intensity without signs/symptoms of physical distress.    Average METs 2.39      Resistance Training   Training  Prescription Yes    Weight 4 lb    Reps 10-15      Interval Training   Interval Training No      Recumbant Bike   Level 3    Watts 25    Minutes 15    METs 2      NuStep   Level 3    Minutes 15    METs 2.9      T5 Nustep   Level 3    SPM 80    Minutes 15    METs 2.1      Biostep-RELP   Level 2    Minutes 15    METs 3      Home Exercise Plan   Plans to continue exercise at Home (comment)   Walking to mailbox and yard perimeter (increasing duration), exercises for back   Frequency Add 2 additional days to program exercise sessions.    Initial Home Exercises Provided 04/17/24      Oxygen   Maintain Oxygen Saturation 88% or higher             Nutrition:  Target Goals: Understanding of nutrition guidelines, daily intake of sodium 1500mg , cholesterol 200mg , calories 30% from fat and 7% or less from saturated fats, daily to have 5 or more servings of fruits and vegetables.  Education: All  About Nutrition: -Group instruction provided by verbal, written material, interactive activities, discussions, models, and posters to present general guidelines for heart healthy nutrition including fat, fiber, MyPlate, the role of sodium in heart healthy nutrition, utilization of the nutrition label, and utilization of this knowledge for meal planning. Follow up email sent as well. Written material given at graduation. Flowsheet Row Cardiac Rehab from 04/26/2024 in [facility] Cardiac and Pulmonary Rehab  Date 03/08/24  Mario Sicard 2]  Educator Efren Grapes  Instruction Review Code 1- Verbalizes Understanding       Biometrics:  Pre Biometrics - 02/21/24 1559       Pre Biometrics   Height 5' 6.5" (1.689 m)    Weight 235 lb 1.6 oz (106.6 kg)    Waist Circumference 47.5 inches    Hip Circumference 53 inches    Waist to Hip Ratio 0.9 %    BMI (Calculated) 37.38    Single Leg Stand 0 seconds   unable to complete             Nutrition Therapy Plan and Nutrition Goals:  Nutrition Therapy &  Goals - 02/23/24 1612       Nutrition Therapy   Diet Carb controlled, Cardiac, Low Na    Protein (specify units) 60-90    Fiber 25 grams    Whole Grain Foods 3 servings    Saturated Fats 15 max. grams    Fruits and Vegetables 5 servings/day    Sodium 2 grams      Personal Nutrition Goals   Nutrition Goal Make plates with more veggies, make daughter healthy plates    Personal Goal #2 Read labels and reduce sodium intake to below 2300mg . Ideally 1500mg  per day.    Personal Goal #3 Eat 15-30gProtein and 30-60gCarbs at each meal.    Comments Patient drinking mostly water, she has DM and doesn't drink any sugary beverages. She is looking to lose weight and eat heart healthy. She used to make meals for her and her husband that were made of a protein, a carb, and a veggie. When her husband passed in late November she received lots of food, then with the holiday she got more. Her freezer was full and she has been eating out of it for the past few months. But now that she is close to finishing off that food, she is having to adjust to her new routine living with her daughter and new kitchen appliances. Provided counseling, support and encouragement. Reviewed a few facts labels and brainstormed ways to get back into healthier eating centered around more veggies and less high calorie foods. She likes veggies and says she could work on eating more. Discussed building plates for her and her daughter with more low calorie foods and still having rooms for some of their favorites like pizza and venison burgers.      Intervention Plan   Intervention Prescribe, educate and counsel regarding individualized specific dietary modifications aiming towards targeted core components such as weight, hypertension, lipid management, diabetes, heart failure and other comorbidities.;Nutrition handout(s) given to patient.    Expected Outcomes Short Term Goal: Understand basic principles of dietary content, such as calories,  fat, sodium, cholesterol and nutrients.;Short Term Goal: A plan has been developed with personal nutrition goals set during dietitian appointment.;Long Term Goal: Adherence to prescribed nutrition plan.             Nutrition Assessments:  MEDIFICTS Score Key: >=70 Need to make dietary changes  40-70 Heart Healthy Diet <= 40  Therapeutic Level Cholesterol Diet   Picture Your Plate Scores: <16 Unhealthy dietary pattern with much room for improvement. 41-50 Dietary pattern unlikely to meet recommendations for good health and room for improvement. 51-60 More healthful dietary pattern, with some room for improvement.  >60 Healthy dietary pattern, although there may be some specific behaviors that could be improved.    Nutrition Goals Re-Evaluation:  Nutrition Goals Re-Evaluation     Row Name 04/10/24 1409 04/19/24 1457           Goals   Comment Novie is still working on eating less salt and sugar. Her daughter has been cooking a lot of her food lately. She says her daughter watches her salt and sugar intake. Encouraged her to eat 3 times per day, include protein rich foods and colorful produce when able She is focusing right now on decreasing her sodium intake, reading food labels and not using salt shaker.  She is still eating her peppermint candies "more than I should".  She is working on ways to keep from eating more than she should every day.      Expected Outcome STG: Eat small frequent meals. LTG: follow a heart healthy diet and support increased needs STG continue with sodium restrictions, keep working on less candy consuption, find a way to only have her daily serving available.   LTG continues with sodium restrictions, lower  daily candy consumption        Personal Goal #2 Re-Evaluation   Personal Goal #2 -- Read labels and reduce sodium intake to below 2300mg . Ideally 1500mg  per day.               Nutrition Goals Discharge (Final Nutrition Goals Re-Evaluation):   Nutrition Goals Re-Evaluation - 04/19/24 1457       Goals   Comment She is focusing right now on decreasing her sodium intake, reading food labels and not using salt shaker.  She is still eating her peppermint candies "more than I should".  She is working on ways to keep from eating more than she should every day.    Expected Outcome STG continue with sodium restrictions, keep working on less candy consuption, find a way to only have her daily serving available.   LTG continues with sodium restrictions, lower  daily candy consumption      Personal Goal #2 Re-Evaluation   Personal Goal #2 Read labels and reduce sodium intake to below 2300mg . Ideally 1500mg  per day.             Psychosocial: Target Goals: Acknowledge presence or absence of significant depression and/or stress, maximize coping skills, provide positive support system. Participant is able to verbalize types and ability to use techniques and skills needed for reducing stress and depression.   Education: Stress, Anxiety, and Depression - Group verbal and visual presentation to define topics covered.  Reviews how body is impacted by stress, anxiety, and depression.  Also discusses healthy ways to reduce stress and to treat/manage anxiety and depression.  Written material given at graduation. Flowsheet Row Cardiac Rehab from 04/26/2024 in [facility] Cardiac and Pulmonary Rehab  Date 04/12/24  Educator SB  Instruction Review Code 1- [employer] Understanding       Education: Sleep Hygiene -Provides group verbal and written instruction about how sleep can affect your health.  Define sleep hygiene, discuss sleep cycles and impact of sleep habits. Review good sleep hygiene tips.    Initial Review & Psychosocial Screening:  Initial Psych Review & Screening - 02/07/24  1207       Initial Review   Current issues with Current Depression;History of Depression;Current Anxiety/Panic;Current Psychotropic Meds      Family Dynamics   Good  Support System? Yes    Comments Fionna has been on medication for years for her depression and anxiety. Her husband died in 11/02/2024 and she had her health delice with her valve replacement. She can look to her daughter and son for support.      Barriers   Psychosocial barriers to participate in program The patient should benefit from training in stress management and relaxation.;There are no identifiable barriers or psychosocial needs.      Screening Interventions   Interventions Provide feedback about the scores to participant;To provide support and resources with identified psychosocial needs;Encouraged to exercise    Expected Outcomes Short Term goal: Utilizing psychosocial counselor, staff and physician to assist with identification of specific Stressors or current issues interfering with healing process. Setting desired goal for each stressor or current issue identified.;Long Term Goal: Stressors or current issues are controlled or eliminated.;Short Term goal: Identification and review with participant of any Quality of Life or Depression concerns found by scoring the questionnaire.;Long Term goal: The participant improves quality of Life and PHQ9 Scores as seen by post scores and/or verbalization of changes             Quality of Life Scores:   Scores of 19 and below usually indicate a poorer quality of life in these areas.  A difference of  2-3 points is a clinically meaningful difference.  A difference of 2-3 points in the total score of the Quality of Life Index has been associated with significant improvement in overall quality of life, self-image, physical symptoms, and general health in studies assessing change in quality of life.  PHQ-9: Review Flowsheet  More data exists      04/19/2024 04/10/2024 04/05/2024 02/21/2024 01/28/2023  Depression screen PHQ 2/9  Decreased Interest 1 3 3 3 3 2   Down, Depressed, Hopeless 1 3 3 3 3 3   PHQ - 2 Score 2 6 6 6 6 5   Altered sleeping 1 3 3 3  3 3   Tired, decreased energy 1 3 3 3 3 3   Change in appetite 0 3 3 3 3 3   Feeling bad or failure about yourself  3 3 3 2 3 1   Trouble concentrating 0 3 3 3 3 3   Moving slowly or fidgety/restless 1 0 0 3 0 2  Suicidal thoughts 0 0 0 0 0 0  PHQ-9 Score 8 21 21 23 21 20   Difficult doing work/chores Very difficult Extremely dIfficult Extremely dIfficult Extremely dIfficult Very difficult Very difficult    Details       Multiple values from one day are sorted in reverse-chronological order        Interpretation of Total Score  Total Score Depression Severity:  1-4 = Minimal depression, 5-9 = Mild depression, 10-14 = Moderate depression, 15-19 = Moderately severe depression, 20-27 = Severe depression   Psychosocial Evaluation and Intervention:  Psychosocial Evaluation - 04/19/24 1452       Psychosocial Evaluation & Interventions   Interventions Encouraged to exercise with the program and follow exercise prescription    Comments Machaela continue to take her meds for depression/anxiety. She said that it has been worse the past few weeks as her daughter living wioth her is not helping as much as she should.  This has caused other fmily to get upset with  her and this upsets Malaiyah. She has started smoking again 1-3 cigarettes a day.   Her sleep pattern is improving some, not back to full normal. She has had a night she slept through the night without waking up around 3 am.    Expected Outcomes STG continue to manage meds for depression and anxiety, talking with MD if need better control of symptoms, work on tobacco cessation again.    LTG  maintainance of depression/anxiety symptoms, tobacco cessation    Continue Psychosocial Services  Follow up required by staff             Psychosocial Re-Evaluation:  Psychosocial Re-Evaluation     Row Name 04/10/24 1406             Psychosocial Re-Evaluation   Current issues with Current Sleep Concerns;Current Anxiety/Panic;Current  Depression       Comments Riyanshi has returned to rehab after a break. She is still exeperiencing a lot of stress, anxiety and depression. She takes meds and reports it helps a lot but sometimes it can still ve difficult. She is not sleeping. Says she cant lay down flat so she has been sleeping in a recliner, but often cant sleep instead watching TV. She says her Dr gave her some meds to sleep better, she reports it works well but takes some time to begin working. Encuraged her to take the meds well before bed time so it begins working around the time she wants to sleep and to turn off the TV       Expected Outcomes STG: take meds and get good nights sleep. LTG: achieve and maintain a positive outlook on health and daily life       Interventions Encouraged to attend Cardiac Rehabilitation for the exercise       Continue Psychosocial Services  Follow up required by staff                Psychosocial Discharge (Final Psychosocial Re-Evaluation):  Psychosocial Re-Evaluation - 04/10/24 1406       Psychosocial Re-Evaluation   Current issues with Current Sleep Concerns;Current Anxiety/Panic;Current Depression    Comments Jerriann has returned to rehab after a break. She is still exeperiencing a lot of stress, anxiety and depression. She takes meds and reports it helps a lot but sometimes it can still ve difficult. She is not sleeping. Says she cant lay down flat so she has been sleeping in a recliner, but often cant sleep instead watching TV. She says her Dr gave her some meds to sleep better, she reports it works well but takes some time to begin working. Encuraged her to take the meds well before bed time so it begins working around the time she wants to sleep and to turn off the TV    Expected Outcomes STG: take meds and get good nights sleep. LTG: achieve and maintain a positive outlook on health and daily life    Interventions Encouraged to attend Cardiac Rehabilitation for the exercise    Continue  Psychosocial Services  Follow up required by staff             Vocational Rehabilitation: Provide vocational rehab assistance to qualifying candidates.   Vocational Rehab Evaluation & Intervention:   Education: Education Goals: Education classes will be provided on a variety of topics geared toward better understanding of heart health and risk factor modification. Participant will state understanding/return demonstration of topics presented as noted by education test scores.  Learning Barriers/Preferences:  Learning  Barriers/Preferences - 02/07/24 1206       Learning Barriers/Preferences   Learning Barriers Sight    Learning Preferences None             General Cardiac Education Topics:  AED/CPR: - Group verbal and written instruction with the use of models to demonstrate the basic use of the AED with the basic ABC's of resuscitation.   Anatomy and Cardiac Procedures: - Group verbal and visual presentation and models provide information about basic cardiac anatomy and function. Reviews the testing methods done to diagnose heart disease and the outcomes of the test results. Describes the treatment choices: Medical Management, Angioplasty, or Coronary Bypass Surgery for treating various heart conditions including Myocardial Infarction, Angina, Valve Disease, and Cardiac Arrhythmias.  Written material given at graduation. Flowsheet Row Cardiac Rehab from 04/26/2024 in [facility] Cardiac and Pulmonary Rehab  Date 03/15/24  Educator SB  Instruction Review Code 1- Verbalizes Understanding       Medication Safety: - Group verbal and visual instruction to review commonly prescribed medications for heart and lung disease. Reviews the medication, class of the drug, and side effects. Includes the steps to properly store meds and maintain the prescription regimen.  Written material given at graduation.   Intimacy: - Group verbal instruction through game format to discuss how heart  and lung disease can affect sexual intimacy. Written material given at graduation..   Know Your Numbers and Heart Failure: - Group verbal and visual instruction to discuss disease risk factors for cardiac and pulmonary disease and treatment options.  Reviews associated critical values for Overweight/Obesity, Hypertension, Cholesterol, and Diabetes.  Discusses basics of heart failure: signs/symptoms and treatments.  Introduces Heart Failure Zone chart for action plan for heart failure.  Written material given at graduation.   Infection Prevention: - Provides verbal and written material to individual with discussion of infection control including proper hand washing and proper equipment cleaning during exercise session. Flowsheet Row Cardiac Rehab from 04/26/2024 in [facility] Cardiac and Pulmonary Rehab  Date 02/07/24  Educator [facility]  Instruction Review Code 1- Verbalizes Understanding       Falls Prevention: - Provides verbal and written material to individual with discussion of falls prevention and safety. Flowsheet Row Cardiac Rehab from 04/26/2024 in [facility] Cardiac and Pulmonary Rehab  Date 02/07/24  Educator [facility]  Instruction Review Code 1- Verbalizes Understanding       Other: -Provides group and verbal instruction on various topics (see comments)   Knowledge Questionnaire Score:   Core Components/Risk Factors/Patient Goals at Admission:  Personal Goals and Risk Factors at Admission - 02/07/24 1206       Core Components/Risk Factors/Patient Goals on Admission    Weight Management Yes;Weight Loss    Intervention Weight Management: Develop a combined nutrition and exercise program designed to reach desired caloric intake, while maintaining appropriate intake of nutrient and fiber, sodium and fats, and appropriate energy expenditure required for the weight goal.;Weight Management: Provide education and appropriate resources to help participant work on and attain dietary goals.;Weight  Management/Obesity: Establish reasonable short term and long term weight goals.    Expected Outcomes Short Term: Continue to assess and modify interventions until short term weight is achieved;Weight Maintenance: Understanding of the daily nutrition guidelines, which includes 25-35% calories from fat, 7% or less cal from saturated fats, less than 200mg  cholesterol, less than 1.5gm of sodium, & 5 or more servings of fruits and vegetables daily;Weight Loss: Understanding of general recommendations for a balanced deficit meal plan, which  promotes 1-2 lb weight loss per week and includes a negative energy balance of [phone] kcal/d;Understanding recommendations for meals to include 15-35% energy as protein, 25-35% energy from fat, 35-60% energy from carbohydrates, less than 200mg  of dietary cholesterol, 20-35 gm of total fiber daily;Understanding of distribution of calorie intake throughout the day with the consumption of 4-5 meals/snacks    Tobacco Cessation Yes    Number of packs per day 0    Intervention Assist the participant in steps to quit. Provide individualized education and counseling about committing to Tobacco Cessation, relapse prevention, and pharmacological support that can be provided by physician.;Education officer, environmental, assist with locating and accessing local/national Quit Smoking programs, and support quit date choice.    Expected Outcomes Short Term: Will demonstrate readiness to quit, by selecting a quit date.;Long Term: Complete abstinence from all tobacco products for at least 12 months from quit date.;Short Term: Will quit all tobacco product use, adhering to prevention of relapse plan.    Diabetes Yes    Intervention Provide education about signs/symptoms and action to take for hypo/hyperglycemia.;Provide education about proper nutrition, including hydration, and aerobic/resistive exercise prescription along with prescribed medications to achieve blood glucose in normal ranges:  Fasting glucose 65-99 mg/dL    Expected Outcomes Short Term: Participant verbalizes understanding of the signs/symptoms and immediate care of hyper/hypoglycemia, proper foot care and importance of medication, aerobic/resistive exercise and nutrition plan for blood glucose control.;Long Term: Attainment of HbA1C < 7%.    Hypertension Yes    Intervention Provide education on lifestyle modifcations including regular physical activity/exercise, weight management, moderate sodium restriction and increased consumption of fresh fruit, vegetables, and low fat dairy, alcohol moderation, and smoking cessation.;Monitor prescription use compliance.    Expected Outcomes Long Term: Maintenance of blood pressure at goal levels.;Short Term: Continued assessment and intervention until BP is < 140/22mm HG in hypertensive participants. < 130/40mm HG in hypertensive participants with diabetes, heart failure or chronic kidney disease.    Lipids Yes    Intervention Provide education and support for participant on nutrition & aerobic/resistive exercise along with prescribed medications to achieve LDL 70mg , HDL >40mg .    Expected Outcomes Short Term: Participant states understanding of desired cholesterol values and is compliant with medications prescribed. Participant is following exercise prescription and nutrition guidelines.;Long Term: Cholesterol controlled with medications as prescribed, with individualized exercise RX and with personalized nutrition plan. Value goals: LDL < 70mg , HDL > 40 mg.             Education:Diabetes - Individual verbal and written instruction to review signs/symptoms of diabetes, desired ranges of glucose level fasting, after meals and with exercise. Acknowledge that pre and post exercise glucose checks will be done for 3 sessions at entry of program. Flowsheet Row Cardiac Rehab from 04/26/2024 in [facility] Cardiac and Pulmonary Rehab  Date 02/07/24  Educator [facility]  Instruction Review Code 1-  Verbalizes Understanding       Core Components/Risk Factors/Patient Goals Review:   Goals and Risk Factor Review     Row Name 04/10/24 1411 04/19/24 1500           Core Components/Risk Factors/Patient Goals Review   Personal Goals Review Hypertension Weight Management/Obesity;Hypertension;Lipids;Diabetes      Review Aleksandra reports she checks her Blood pressure at home. Encouraged her to continue to monitor her blood pressure and report any changes to rehab team or Dr Bernardo Bridgeman has lost a few pounds since starting the program 235 to 232 today, her BP is is  good range 116-128/58-64 after cooldown, her lipids recent draw LDL is 48.  She continues to take her meds and attend the exercise ssessions.  She is not walking around outside because of weather. she does walk around her house when doing chores.  Her last HbA1C was 6.9%, up some from last 2 . Sh eis concentrating on cutting back on the number of peppermints she eats everyday.  She is compliaant with her meds and has reduced her sodium intake.      Expected Outcomes STG: check BP at home. LTG: manage risk factors independently STG continue with cutting back on daily candy consumption, maintian compliance with all meds. continue sodium restriction and weight loss steps  LTG  better control of intake of sweets and sodium, continue to maintain med compiance and exercise progression               Core Components/Risk Factors/Patient Goals at Discharge (Final Review):   Goals and Risk Factor Review - 04/19/24 1500       Core Components/Risk Factors/Patient Goals Review   Personal Goals Review Weight Management/Obesity;Hypertension;Lipids;Diabetes    Review Aarti has lost a few pounds since starting the program 235 to 232 today, her BP is is good range 116-128/58-64 after cooldown, her lipids recent draw LDL is 48.  She continues to take her meds and attend the exercise ssessions.  She is not walking around outside because of weather. she  does walk around her house when doing chores.  Her last HbA1C was 6.9%, up some from last 2 . Sh eis concentrating on cutting back on the number of peppermints she eats everyday.  She is compliaant with her meds and has reduced her sodium intake.    Expected Outcomes STG continue with cutting back on daily candy consumption, maintian compliance with all meds. continue sodium restriction and weight loss steps  LTG  better control of intake of sweets and sodium, continue to maintain med compiance and exercise progression             ITP Comments:  ITP Comments     Row Name 02/07/24 1205 02/21/24 1553 03/15/24 0945 04/12/24 1037 05/10/24 1038   ITP Comments Virtual Visit completed. Patient informed on EP and RD appointment and 6 Minute walk test. Patient also informed of patient health questionnaires on My Chart. Patient Verbalizes understanding. Visit diagnosis can be found in [facility]. Completed and gym orientation. Initial ITP created and sent for review to Dr. Firman Hughes, Medical Director. 30 Day review completed. Medical Director ITP review done, changes made as directed, and signed approval by Medical Director.   new to program 30 Day review completed. Medical Director ITP review done, changes made as directed, and signed approval by Medical Director. 30 Day review completed. Medical Director ITP review done, changes made as directed, and signed approval by Medical Director.            Comments:

## 2024-05-10 NOTE — Progress Notes (Signed)
 Daily Session Note  Patient Details  Name: Amanda [name] MRN: [id number] Date of Birth: 01-23-1951 Referring Provider:   Flowsheet Row Cardiac Rehab from 02/21/2024 in [facility] Cardiac and Pulmonary Rehab  Referring Provider Dr. Antionette Kirks, MD       Encounter Date: 05/10/2024  Check In:  Session Check In - 05/10/24 1441       Check-In   Supervising physician immediately available to respond to emergencies See telemetry face sheet for immediately available ER MD    Location [facility]-Cardiac & Pulmonary Rehab    Staff Present Maud Sorenson, RN, BSN, CCRP;Kelly Hayes BS, ACSM CEP, Exercise Physiologist;Noah Tickle, BS, Exercise Physiologist;Jason Martina Sledge RDN,LDN;Kelly Bollinger [facility]    Virtual Visit No    Medication changes reported     No    Fall or balance concerns reported    No    Warm-up and Cool-down Performed on first and last piece of equipment    Resistance Training Performed Yes    VAD Patient? No    PAD/SET Patient? No      Pain Assessment   Currently in Pain? No/denies                Social History   Tobacco Use  Smoking Status Former   Average packs/day: 0.8 packs/day for 45.0 years (33.8 ttl pk-yrs)   Types: Cigarettes   Start date: 01/2023  Smokeless Tobacco Never  Tobacco Comments   Does not on starting back smoking.    Goals Met:  Independence with exercise equipment Exercise tolerated well No report of concerns or symptoms today  Goals Unmet:  Not Applicable  Comments: Pt able to follow exercise prescription today without complaint.  Will continue to monitor for progression.    Dr. Firman Hughes is Medical Director for [facility] Cardiac Rehabilitation.  Dr. Fuad Aleskerov is Medical Director for [facility] Pulmonary Rehabilitation.

## 2024-05-11 ENCOUNTER — Encounter

## 2024-05-11 DIAGNOSIS — Z48812 Encounter for surgical aftercare following surgery on the circulatory system: Secondary | ICD-10-CM | POA: Diagnosis not present

## 2024-05-11 DIAGNOSIS — Z952 Presence of prosthetic heart valve: Secondary | ICD-10-CM | POA: Diagnosis not present

## 2024-05-11 DIAGNOSIS — Z5189 Encounter for other specified aftercare: Secondary | ICD-10-CM | POA: Diagnosis not present

## 2024-05-11 DIAGNOSIS — J449 Chronic obstructive pulmonary disease, unspecified: Secondary | ICD-10-CM | POA: Diagnosis not present

## 2024-05-11 DIAGNOSIS — F17200 Nicotine dependence, unspecified, uncomplicated: Secondary | ICD-10-CM | POA: Diagnosis not present

## 2024-05-11 NOTE — Progress Notes (Signed)
 Daily Session Note  Patient Details  Name: RONISHA HERRINGSHAW MRN: [id number] Date of Birth: 1951-05-21 Referring Provider:   Flowsheet Row Cardiac Rehab from 02/21/2024 in [facility] Cardiac and Pulmonary Rehab  Referring Provider Dr. Antionette Kirks, MD       Encounter Date: 05/11/2024  Check In:  Session Check In - 05/11/24 1416       Check-In   Supervising physician immediately available to respond to emergencies See telemetry face sheet for immediately available ER MD    Location [facility]-Cardiac & Pulmonary Rehab    Staff Present Maud Sorenson, RN, BSN, CCRP;Maxon Conetta BS, Exercise Physiologist;Noah Tickle, BS, Exercise Physiologist;Joseph Hood RCP,RRT,BSRT    Virtual Visit No    Medication changes reported     No    Fall or balance concerns reported    No    Warm-up and Cool-down Performed on first and last piece of equipment    Resistance Training Performed Yes    VAD Patient? No    PAD/SET Patient? No      Pain Assessment   Currently in Pain? No/denies                Social History   Tobacco Use  Smoking Status Former   Average packs/day: 0.8 packs/day for 45.0 years (33.8 ttl pk-yrs)   Types: Cigarettes   Start date: 01/2023  Smokeless Tobacco Never  Tobacco Comments   Does not on starting back smoking.    Goals Met:  Independence with exercise equipment Exercise tolerated well No report of concerns or symptoms today  Goals Unmet:  Not Applicable  Comments: Pt able to follow exercise prescription today without complaint.  Will continue to monitor for progression.    Dr. Firman Hughes is Medical Director for [facility] Cardiac Rehabilitation.  Dr. Fuad Aleskerov is Medical Director for [facility] Pulmonary Rehabilitation.

## 2024-05-15 ENCOUNTER — Encounter

## 2024-05-15 DIAGNOSIS — Z48812 Encounter for surgical aftercare following surgery on the circulatory system: Secondary | ICD-10-CM | POA: Diagnosis not present

## 2024-05-15 DIAGNOSIS — F17200 Nicotine dependence, unspecified, uncomplicated: Secondary | ICD-10-CM | POA: Diagnosis not present

## 2024-05-15 DIAGNOSIS — J449 Chronic obstructive pulmonary disease, unspecified: Secondary | ICD-10-CM | POA: Diagnosis not present

## 2024-05-15 DIAGNOSIS — Z5189 Encounter for other specified aftercare: Secondary | ICD-10-CM | POA: Diagnosis not present

## 2024-05-15 DIAGNOSIS — Z952 Presence of prosthetic heart valve: Secondary | ICD-10-CM

## 2024-05-15 NOTE — Progress Notes (Signed)
 Daily Session Note  Patient Details  Name: Amanda [name] MRN: [id number] Date of Birth: 1951-04-04 Referring Provider:   Flowsheet Row Cardiac Rehab from 02/21/2024 in [facility] Cardiac and Pulmonary Rehab  Referring Provider Dr. Antionette Kirks, MD       Encounter Date: 05/15/2024  Check In:  Session Check In - 05/15/24 1335       Check-In   Supervising physician immediately available to respond to emergencies See telemetry face sheet for immediately available ER MD    Location [facility]-Cardiac & Pulmonary Rehab    Staff Present Josph Nimrod RN,BSN,MPA;Margaret Best, MS, Exercise Physiologist;Denise Rhew PhD, RN,CNS,CEN;Noah Tickle, BS, Exercise Physiologist    Virtual Visit No    Medication changes reported     No    Fall or balance concerns reported    No    Tobacco Cessation No Change    Warm-up and Cool-down Performed on first and last piece of equipment    Resistance Training Performed Yes    VAD Patient? No    PAD/SET Patient? No      Pain Assessment   Currently in Pain? No/denies                Social History   Tobacco Use  Smoking Status Former   Average packs/day: 0.8 packs/day for 45.0 years (33.8 ttl pk-yrs)   Types: Cigarettes   Start date: 01/2023  Smokeless Tobacco Never  Tobacco Comments   Does not on starting back smoking.    Goals Met:  Independence with exercise equipment Exercise tolerated well No report of concerns or symptoms today Strength training completed today  Goals Unmet:  Not Applicable  Comments: Pt able to follow exercise prescription today without complaint.  Will continue to monitor for progression.    Dr. Firman Hughes is Medical Director for [facility] Cardiac Rehabilitation.  Dr. Fuad Aleskerov is Medical Director for [facility] Pulmonary Rehabilitation.

## 2024-05-17 ENCOUNTER — Ambulatory Visit (INDEPENDENT_AMBULATORY_CARE_PROVIDER_SITE_OTHER): Admitting: Podiatry

## 2024-05-17 ENCOUNTER — Encounter

## 2024-05-17 ENCOUNTER — Encounter: Payer: Self-pay | Admitting: Podiatry

## 2024-05-17 DIAGNOSIS — M79676 Pain in unspecified toe(s): Secondary | ICD-10-CM

## 2024-05-17 DIAGNOSIS — B351 Tinea unguium: Secondary | ICD-10-CM | POA: Diagnosis not present

## 2024-05-17 DIAGNOSIS — E1142 Type 2 diabetes mellitus with diabetic polyneuropathy: Secondary | ICD-10-CM

## 2024-05-17 NOTE — Progress Notes (Signed)
 Presents today chief complaint of painfully elongated toenails.  Objective: Toenails are long thick yellow dystrophic clinically mycotic pulses are palpable.  No open lesions or wounds.  Assessment: Pain limb secondary to onychomycosis.  Plan: Debridement of toenails 1 through 5 bilateral.

## 2024-05-18 ENCOUNTER — Encounter: Admitting: *Deleted

## 2024-05-18 VITALS — Ht 66.5 in | Wt 230.6 lb

## 2024-05-18 DIAGNOSIS — Z48812 Encounter for surgical aftercare following surgery on the circulatory system: Secondary | ICD-10-CM | POA: Diagnosis not present

## 2024-05-18 DIAGNOSIS — J449 Chronic obstructive pulmonary disease, unspecified: Secondary | ICD-10-CM | POA: Diagnosis not present

## 2024-05-18 DIAGNOSIS — Z5189 Encounter for other specified aftercare: Secondary | ICD-10-CM | POA: Diagnosis not present

## 2024-05-18 DIAGNOSIS — F17200 Nicotine dependence, unspecified, uncomplicated: Secondary | ICD-10-CM | POA: Diagnosis not present

## 2024-05-18 DIAGNOSIS — Z952 Presence of prosthetic heart valve: Secondary | ICD-10-CM | POA: Diagnosis not present

## 2024-05-18 NOTE — Patient Instructions (Signed)
 Discharge Patient Instructions  Patient Details  Name: Amanda [name] MRN: [id number] Date of Birth: 1951-02-06 Referring Provider:  Scherrie Curt, MD   Number of Visits: 50  Reason for Discharge:  Patient reached a stable level of exercise. Patient independent in their exercise. Patient has met program and personal goals.   Diagnosis:  S/P TAVR (transcatheter aortic valve replacement)  Initial Exercise Prescription:  Initial Exercise Prescription - 02/21/24 1600       Date of Initial Exercise RX and Referring Provider   Date 02/21/24    Referring Provider Dr. Antionette Kirks, MD      Oxygen   Maintain Oxygen Saturation 88% or higher      Recumbant Bike   Level 1    RPM 50    Watts 15    Minutes 15    METs 1      NuStep   Level 1    SPM 80    Minutes 15    METs 1      Track   Laps 15    Minutes 15    METs 1.82      Prescription Details   Frequency (times per week) 3    Duration Progress to 30 minutes of continuous aerobic without signs/symptoms of physical distress      Intensity   THRR 40-80% of Max Heartrate 97-130    Ratings of Perceived Exertion 11-13    Perceived Dyspnea 0-4      Progression   Progression Continue to progress workloads to maintain intensity without signs/symptoms of physical distress.      Resistance Training   Training Prescription Yes    Weight 3 lb    Reps 10-15          Discharge Exercise Prescription (Final Exercise Prescription Changes):  Exercise Prescription Changes - 05/04/24 1800       Response to Exercise   Blood Pressure (Admit) 130/68    Blood Pressure (Exit) 108/68    Heart Rate (Admit) 93 bpm    Heart Rate (Exercise) 120 bpm    Heart Rate (Exit) 90 bpm    Oxygen Saturation (Admit) 95 %    Oxygen Saturation (Exercise) 92 %    Oxygen Saturation (Exit) 95 %    Rating of Perceived Exertion (Exercise) 15    Symptoms none    Duration Continue with 30 min of aerobic exercise without signs/symptoms of  physical distress.    Intensity THRR unchanged      Progression   Progression Continue to progress workloads to maintain intensity without signs/symptoms of physical distress.    Average METs 2.39      Resistance Training   Training Prescription Yes    Weight 4 lb    Reps 10-15      Interval Training   Interval Training No      Recumbant Bike   Level 3    Watts 25    Minutes 15    METs 2      NuStep   Level 3    Minutes 15    METs 2.9      T5 Nustep   Level 3    SPM 80    Minutes 15    METs 2.1      Biostep-RELP   Level 2    Minutes 15    METs 3      Home Exercise Plan   Plans to continue exercise at Home (comment)   Walking to mailbox and  yard perimeter (increasing duration), exercises for back   Frequency Add 2 additional days to program exercise sessions.    Initial Home Exercises Provided 04/17/24      Oxygen   Maintain Oxygen Saturation 88% or higher          Functional Capacity:  6 Minute Walk     Row Name 02/21/24 1557 05/18/24 1403       6 Minute Walk   Phase Initial Discharge    Distance 580 feet 970 feet    Distance % Change -- 67 %    Distance Feet Change -- 390 ft    Walk Time 6 minutes 6 minutes    # of Rest Breaks 0 0    MPH 1.1 1.8    METS 1 1.77    RPE 11 12    Perceived Dyspnea  1 1    VO2 Peak 3.21 6.19    Symptoms Yes (comment) Yes (comment)    Comments leg fatigue, used walker leg fatigue, used walker    Resting HR 65 bpm 76 bpm    Resting BP 102/56 118/68    Resting Oxygen Saturation  96 % 96 %    Exercise Oxygen Saturation  during 6 min walk 94 % 95 %    Max Ex. HR 96 bpm 101 bpm    Max Ex. BP 138/70 156/58    2 Minute Post BP 134/68 --       Nutrition & Weight - Outcomes:  Pre Biometrics - 02/21/24 1559       Pre Biometrics   Height 5' 6.5 (1.689 m)    Weight 235 lb 1.6 oz (106.6 kg)    Waist Circumference 47.5 inches    Hip Circumference 53 inches    Waist to Hip Ratio 0.9 %    BMI (Calculated) 37.38     Single Leg Stand 0 seconds   unable to complete         Post Biometrics - 05/18/24 1406        Post  Biometrics   Height 5' 6.5 (1.689 m)    Weight 230 lb 9.6 oz (104.6 kg)    Waist Circumference 45 inches    Hip Circumference 53 inches    Waist to Hip Ratio 0.85 %    BMI (Calculated) 36.67    Single Leg Stand 2.32 seconds         Goals reviewed with patient; copy given to patient.

## 2024-05-18 NOTE — Progress Notes (Signed)
 Daily Session Note  Patient Details  Name: Amanda [name] MRN: [id number] Date of Birth: [birth year] Referring Provider:   Flowsheet Row Cardiac Rehab from 02/21/2024 in [facility] Cardiac and Pulmonary Rehab  Referring Provider Dr. Antionette Kirks, MD    Encounter Date: 05/18/2024  Check In:  Session Check In - 05/18/24 1357       Check-In   Supervising physician immediately available to respond to emergencies See telemetry face sheet for immediately available ER MD    Location [facility]-Cardiac & Pulmonary Rehab    Staff Present Maud Sorenson, RN, BSN, CCRP;Meredith Manson Seitz RN,BSN;Maxon [employer], Exercise Physiologist;Noah Tickle, BS, Exercise Physiologist    Virtual Visit No    Medication changes reported     No    Fall or balance concerns reported    No    Warm-up and Cool-down Performed on first and last piece of equipment    Resistance Training Performed Yes    VAD Patient? No    PAD/SET Patient? No      Pain Assessment   Currently in Pain? No/denies             Social History   Tobacco Use  Smoking Status Former   Average packs/day: 0.8 packs/day for 45.0 years (33.8 ttl pk-yrs)   Types: Cigarettes   Start date: 01/2023  Smokeless Tobacco Never  Tobacco Comments   Does not on starting back smoking.    Goals Met:  Independence with exercise equipment Exercise tolerated well No report of concerns or symptoms today  Goals Unmet:  Not Applicable  Comments: Pt able to follow exercise prescription today without complaint.  Will continue to monitor for progression.    Dr. Firman Hughes is Medical Director for [facility] Cardiac Rehabilitation.  Dr. Fuad Aleskerov is Medical Director for [facility] Pulmonary Rehabilitation.

## 2024-05-22 ENCOUNTER — Encounter: Admitting: *Deleted

## 2024-05-22 DIAGNOSIS — Z48812 Encounter for surgical aftercare following surgery on the circulatory system: Secondary | ICD-10-CM | POA: Diagnosis not present

## 2024-05-22 DIAGNOSIS — J449 Chronic obstructive pulmonary disease, unspecified: Secondary | ICD-10-CM | POA: Diagnosis not present

## 2024-05-22 DIAGNOSIS — Z952 Presence of prosthetic heart valve: Secondary | ICD-10-CM

## 2024-05-22 DIAGNOSIS — F17200 Nicotine dependence, unspecified, uncomplicated: Secondary | ICD-10-CM | POA: Diagnosis not present

## 2024-05-22 DIAGNOSIS — Z5189 Encounter for other specified aftercare: Secondary | ICD-10-CM | POA: Diagnosis not present

## 2024-05-22 NOTE — Progress Notes (Signed)
 Daily Session Note  Patient Details  Name: Amanda [name] MRN: [id number] Date of Birth: [birth year] Referring Provider:   Flowsheet Row Cardiac Rehab from 02/21/2024 in [facility] Cardiac and Pulmonary Rehab  Referring Provider Dr. Antionette Kirks, MD    Encounter Date: 05/22/2024  Check In:  Session Check In - 05/22/24 1409       Check-In   Supervising physician immediately available to respond to emergencies See telemetry face sheet for immediately available ER MD    Location [facility]-Cardiac & Pulmonary Rehab    Staff Present Sue Em RN,BSN;Maxon Conetta BS, Exercise Physiologist;Margaret Best, MS, Exercise Physiologist;Noah Tickle, BS, Exercise Physiologist    Virtual Visit No    Medication changes reported     No    Fall or balance concerns reported    No    Warm-up and Cool-down Performed on first and last piece of equipment    Resistance Training Performed Yes    VAD Patient? No    PAD/SET Patient? No      Pain Assessment   Currently in Pain? No/denies             Social History   Tobacco Use  Smoking Status Former   Average packs/day: 0.8 packs/day for 45.0 years (33.8 ttl pk-yrs)   Types: Cigarettes   Start date: 01/2023  Smokeless Tobacco Never  Tobacco Comments   Does not on starting back smoking.    Goals Met:  Independence with exercise equipment Exercise tolerated well No report of concerns or symptoms today Strength training completed today  Goals Unmet:  Not Applicable  Comments: Pt able to follow exercise prescription today without complaint.  Will continue to monitor for progression.    Dr. Firman Hughes is Medical Director for [facility] Cardiac Rehabilitation.  Dr. Fuad Aleskerov is Medical Director for [facility] Pulmonary Rehabilitation.

## 2024-05-24 ENCOUNTER — Encounter: Admitting: *Deleted

## 2024-05-24 DIAGNOSIS — F17200 Nicotine dependence, unspecified, uncomplicated: Secondary | ICD-10-CM | POA: Diagnosis not present

## 2024-05-24 DIAGNOSIS — Z48812 Encounter for surgical aftercare following surgery on the circulatory system: Secondary | ICD-10-CM | POA: Diagnosis not present

## 2024-05-24 DIAGNOSIS — Z952 Presence of prosthetic heart valve: Secondary | ICD-10-CM | POA: Diagnosis not present

## 2024-05-24 DIAGNOSIS — J449 Chronic obstructive pulmonary disease, unspecified: Secondary | ICD-10-CM | POA: Diagnosis not present

## 2024-05-24 DIAGNOSIS — Z5189 Encounter for other specified aftercare: Secondary | ICD-10-CM | POA: Diagnosis not present

## 2024-05-24 NOTE — Progress Notes (Signed)
 Daily Session Note  Patient Details  Name: Amanda [name] MRN: [id number] Date of Birth: 1951-08-23 Referring Provider:   Flowsheet Row Cardiac Rehab from 02/21/2024 in [facility] Cardiac and Pulmonary Rehab  Referring Provider Dr. Antionette Kirks, MD    Encounter Date: 05/24/2024  Check In:  Session Check In - 05/24/24 1411       Check-In   Supervising physician immediately available to respond to emergencies See telemetry face sheet for immediately available ER MD    Location [facility]-Cardiac & Pulmonary Rehab    Staff Present Sue Em RN,BSN;Susanne Bice, RN, BSN, CCRP;Jason Martina Sledge RDN,LDN;Noah Tickle, BS, Exercise Physiologist    Virtual Visit No    Medication changes reported     No    Fall or balance concerns reported    No    Warm-up and Cool-down Performed on first and last piece of equipment    Resistance Training Performed Yes    VAD Patient? No    PAD/SET Patient? No      Pain Assessment   Currently in Pain? No/denies             Social History   Tobacco Use  Smoking Status Former   Average packs/day: 0.8 packs/day for 45.0 years (33.8 ttl pk-yrs)   Types: Cigarettes   Start date: 01/2023  Smokeless Tobacco Never  Tobacco Comments   Does not on starting back smoking.    Goals Met:  Independence with exercise equipment Exercise tolerated well No report of concerns or symptoms today Strength training completed today  Goals Unmet:  Not Applicable  Comments: Pt able to follow exercise prescription today without complaint.  Will continue to monitor for progression.    Dr. Firman Hughes is Medical Director for [facility] Cardiac Rehabilitation.  Dr. Fuad Aleskerov is Medical Director for [facility] Pulmonary Rehabilitation.

## 2024-05-25 ENCOUNTER — Ambulatory Visit (INDEPENDENT_AMBULATORY_CARE_PROVIDER_SITE_OTHER): Admitting: Internal Medicine

## 2024-05-25 ENCOUNTER — Encounter: Payer: Self-pay | Admitting: Internal Medicine

## 2024-05-25 ENCOUNTER — Encounter

## 2024-05-25 VITALS — BP 130/80 | HR 95 | Temp 98.4°F | Ht 66.5 in | Wt 230.8 lb

## 2024-05-25 DIAGNOSIS — F17210 Nicotine dependence, cigarettes, uncomplicated: Secondary | ICD-10-CM

## 2024-05-25 DIAGNOSIS — J449 Chronic obstructive pulmonary disease, unspecified: Secondary | ICD-10-CM

## 2024-05-25 DIAGNOSIS — F17200 Nicotine dependence, unspecified, uncomplicated: Secondary | ICD-10-CM

## 2024-05-25 NOTE — Progress Notes (Signed)
 [facility] Amanda [name] Consultation      Date: 05/25/2024,   MRN# [id number] Amanda [name] [birth year]   CHIEF COMPLAINT:   Assessment of SOB   HISTORY OF PRESENT ILLNESS   73 year old pleasant white female seen today for following hospitalization for acute COPD exacerbation, extensive cardiac history  Assessment of shortness of breath Patient has extensive smoking history 1 pack a day for the last 50 years Patient quit January 2025 when she had her AV replaced Patient sees Dr. Ladona [name] patient with a history of cardiac demand ischemia Echo showed normal pump function, normal wall motion, mild thickening and stiffening of the heart, normal right-sided pump function, and normal functioning aortic valve replacement.    Patient currently has shortness of breath and dyspnea on exertion Persistent findings Patient was started on Pulmicort  nebulizers however did not start therapy as of yet we will plan to start tonight No exacerbation at this time No evidence of heart failure at this time No evidence or signs of infection at this time No respiratory distress No fevers, chills, nausea, vomiting, diarrhea No evidence of lower extremity edema No evidence hemoptysis  Ambulating pulse oximetry in the office did not reveal any hypoxia Plan for overnight pulse oximetry for nocturnal hypoxia assessment    Cardiology history 73 y.o. female with history of nonobstructive CAD by LHC most recently in 09/2023, moderate to severe aortic valve stenosis status post TAVR in 12/2023, diet-controlled diabetes, COPD, HTN, HLD,  mild positional OSA, and mild cognitive impairment followed by neurology   -Coronary CTA in 08/2018 showed a calcium  score of 521 with moderate nonobstructive disease involving the LAD and RCA that were not significant by ctFFR.   -Echo in 08/2019 demonstrated an EF of 60 to 65% with moderate aortic stenosis with a mean gradient of 19 mmHg and a valve area of 1.28 cm.   Repeat echo in 11/2020 showed an EF of 60 to 65%, grade 1 diastolic dysfunction, and moderate aortic stenosis with a mean gradient of 22 mmHg and a valve area of 1.29 cm.  -11/2020 with chest pain and worsening exertional dyspnea.  R/LHC showed mild nonobstructive CAD with RHC showing normal filling pressures, normal pulmonary pressure, and normal cardiac output.  Aortic stenosis was moderate with a mean gradient of 24 mmHg and a valve area of 1.44 cm.  Carotid artery ultrasound in 06/2021 showed 1 to 39% bilateral ICA stenoses with antegrade flow of the bilateral vertebral arteries and normal flow hemodynamics of the bilateral subclavian arteries.   -07/2021 leading to 2 broken ribs.  She was admitted in 11/2021 with right hand cellulitis/abscess due to an infected cat bite.  Echo during that admission demonstrated an EF of 65 to 70%, no regional wall motion abnormalities, mild LVH, normal LV diastolic function parameters, normal RV systolic function and ventricular cavity size, mild to moderate aortic valve insufficiency, and moderate aortic valve stenosis with a mean gradient of 29.2 mmHg.   -  Lexiscan  MPI on 07/16/2022 showed no evidence of ischemia or infarction with normal LVEF and was overall low risk.  ABI normal bilaterally in 08/2022.  Echo in 11/2022 showed an EF of 65 to 70%, no regional wall motion abnormalities, mild LVH, grade 1 diastolic dysfunction mild aortic insufficiency, moderate aortic stenosis with a mean gradient of 30 mmHg and a valve area of 1.53 cm, and an estimated right atrial pressure of 8 mmHg.      PAST MEDICAL HISTORY   Past Medical History:  Diagnosis  Date   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 sting    Aortic stenosis    a. 2013: nl LV sys fxn, mild MR, no evidence of pulm htn; b. TTE 8/17: EF 60-65%, no RWMA, nl LV dia fxn,  mild AS, mod AI   CAD (coronary artery disease), native coronary artery 11/26/2020   Controlled type 2 diabetes mellitus with diabetic autonomic neuropathy, without  long-term current use of insulin  (HCC) 08/10/2007   COPD (chronic obstructive pulmonary disease) (HCC) 10/30/2016   Depression    GAD (generalized anxiety disorder)    GERD (gastroesophageal reflux disease)    Hiatal hernia with gastroesophageal reflux 1997   Hyperlipidemia    Hypertension    Osteoporosis    S/P TAVR (transcatheter aortic valve replacement) 12/21/2023   s/p TAVR with a 34 mm Evolut FX via the TF approach by Dr. Lorie Rook & Dr. Honey Lusty   Tobacco abuse      SURGICAL HISTORY   Past Surgical History:  Procedure Laterality Date   ABDOMINAL HYSTERECTOMY  1993   ANTERIOR CERVICAL DECOMP/DISCECTOMY FUSION N/A 10/26/2018   Procedure: ACDF C4-C5 C5-C6 C6-C7;  Surgeon: Gearl Keens, MD;  Location: [facility] OR;  Service: Neurosurgery;  Laterality: N/A;   APPENDECTOMY     BACK SURGERY     BREAST BIOPSY Right 2007   benign   CHOLECYSTECTOMY     COLONOSCOPY     30 years ago was normal per pt.    DILATION AND CURETTAGE OF UTERUS     ESOPHAGEAL DILATION     x 3   ESOPHAGOGASTRODUODENOSCOPY (EGD) WITH PROPOFOL  N/A 02/06/2022   Procedure: ESOPHAGOGASTRODUODENOSCOPY (EGD) WITH PROPOFOL ;  Surgeon: Luke Salaam, MD;  Location: [facility] ENDOSCOPY;  Service: Gastroenterology;  Laterality: N/A;   ESOPHAGOGASTRODUODENOSCOPY (EGD) WITH PROPOFOL  N/A 02/26/2022   Procedure: ESOPHAGOGASTRODUODENOSCOPY (EGD) WITH PROPOFOL ;  Surgeon: Luke Salaam, MD;  Location: [facility] ENDOSCOPY;  Service: Gastroenterology;  Laterality: N/A;  EGD+dilation per Dr. Antony Baumgartner   fractured leg Right    INCISION AND DRAINAGE Right 12/02/2021   Procedure: INCISION AND DRAINAGE;  Surgeon: Molli Angelucci, MD;  Location: [facility] ORS;  Service: Orthopedics;  Laterality: Right;   INTRAOPERATIVE TRANSTHORACIC ECHOCARDIOGRAM N/A 12/21/2023   Procedure: INTRAOPERATIVE TRANSTHORACIC ECHOCARDIOGRAM;  Surgeon: Kyra Phy, MD;  Location: [facility] INVASIVE CV LAB;  Service: Cardiovascular;  Laterality: N/A;   LEFT HEART CATH AND CORONARY ANGIOGRAPHY N/A  11/11/2020   Procedure: LEFT HEART CATH AND CORONARY ANGIOGRAPHY;  Surgeon: Wenona Hamilton, MD;  Location: [facility] INVASIVE CV LAB;  Service: Cardiovascular;  Laterality: N/A;   OOPHORECTOMY     OVARIAN CYST REMOVAL  1972   RIGHT/LEFT HEART CATH AND CORONARY ANGIOGRAPHY Bilateral 10/04/2023   Procedure: RIGHT/LEFT HEART CATH AND CORONARY ANGIOGRAPHY;  Surgeon: Wenona Hamilton, MD;  Location: [facility] INVASIVE CV LAB;  Service: Cardiovascular;  Laterality: Bilateral;   SALIVARY GLAND SURGERY     TONSILLECTOMY     4 yoa   TOTAL KNEE ARTHROPLASTY Left 12/24/2015   Procedure: LEFT TOTAL KNEE ARTHROPLASTY;  Surgeon: Jasmine Mesi, MD;  Location: [facility] OR;  Service: Orthopedics;  Laterality: Left;   TOTAL KNEE ARTHROPLASTY Right 12/30/2017   Procedure: RIGHT TOTAL KNEE ARTHROPLASTY;  Surgeon: Jasmine Mesi, MD;  Location: [facility] OR;  Service: Orthopedics;  Laterality: Right;   WRIST FRACTURE SURGERY  11/2008     FAMILY HISTORY   Family History  Problem Relation Age of Onset   Hyperlipidemia Mother    Arthritis Mother    Diabetes Mother    Heart disease  Mother    Colon polyps Mother    Cancer Paternal Grandmother        breast   Hypercholesterolemia Sister    Colon polyps Sister    Breast cancer Sister    Colon cancer Neg Hx    Esophageal cancer Neg Hx    Rectal cancer Neg Hx    Stomach cancer Neg Hx    Pancreatic cancer Neg Hx      SOCIAL HISTORY   Social History   Tobacco Use   Smoking status: Former    Average packs/day: 0.8 packs/day for 45.0 years (33.8 ttl pk-yrs)    Types: Cigarettes    Start date: 01/2023   Smokeless tobacco: Never   Tobacco comments:    Does not on starting back smoking.  Vaping Use   Vaping status: Never Used  Substance Use Topics   Alcohol use: Yes    Alcohol/week: 0.0 standard drinks of alcohol    Comment: glass of wine twice a month    Drug use: No     MEDICATIONS    Home Medication:  Current Outpatient Rx   Order #:  [id number] Class: Historical Med   Order #: [id number] Class: Normal   Order #: [id number] Class: Normal   Order #: [id number] Class: Historical Med   Order #: [id number] Class: Historical Med   Order #: [id number] Class: Normal   Order #: [id number] Class: Normal   Order #: [id number] Class: Historical Med   Order #: [id number] Class: Historical Med   Order #: [id number] Class: Historical Med   Order #: [id number] Class: Normal   Order #: [id number] Class: Historical Med   Order #: [id number] Class: Normal   Order #: [id number] Class: Historical Med   Order #: [id number] Class: Normal   Order #: [id number] Class: Normal   Order #: [id number] Class: Normal   Order #: [id number] Class: Historical Med   Order #: [id number] Class: Historical Med   Order #: [id number] Class: Historical Med   Order #: [id number] Class: Normal   Order #: [id number] Class: Historical Med   Order #: [id number] Class: Historical Med   Order #: [id number] Class: Normal   Order #: [id number] Class: Historical Med   Order #: [id number] Class: Historical Med   Order #: [id number] Class: Normal   Order #: [id number] Class: Normal   Order #: [id number] Class: Historical Med   Order #: [id number] Class: Historical Med   Order #: [id number] Class: Normal    Current Medication:  Current Outpatient Medications:    acetaminophen  (TYLENOL ) 500 MG tablet, Take 1,000 mg by mouth every 8 (eight) hours as needed for mild pain (pain score 1-3) or moderate pain (pain score 4-6)., Disp: , Rfl:    albuterol  (VENTOLIN  HFA) 108 (90 Base) MCG/ACT inhaler, Inhale 2 puffs into the lungs every 4 (four) hours as needed for wheezing or shortness of breath., Disp: 3 each, Rfl: 2   amitriptyline  (ELAVIL ) 50 MG tablet, TAKE 1/2 (ONE-HALF) TABLET BY MOUTH AT BEDTIME, Disp: 45 tablet, Rfl: 1   Ascorbic Acid  (VITAMIN C ) 1000 MG tablet, Take 1,000 mg by mouth daily., Disp: , Rfl:    aspirin  EC 81 MG tablet, Take 81 mg by mouth daily., Disp: , Rfl:    budesonide  (PULMICORT ) 0.5 MG/2ML nebulizer  solution, Take 2 mLs (0.5 mg total) by nebulization 2 (two) times daily., Disp: 360 mL, Rfl: 3   buPROPion  (WELLBUTRIN  XL) 150 MG 24 hr tablet, Take 1 tablet (150 mg total) by mouth daily., Disp: 90 tablet, Rfl: 1   calcium  carbonate (OS-CAL - DOSED IN MG OF ELEMENTAL CALCIUM ) 1250 (500 Ca) MG tablet, Take 1 tablet by mouth  daily at 6 (six) AM., Disp: , Rfl:    calcium  carbonate (TUMS - DOSED IN MG ELEMENTAL CALCIUM ) 500 MG chewable tablet, Chew 2 tablets by mouth daily as needed for indigestion or heartburn., Disp: , Rfl:    Cholecalciferol  (VITAMIN D ) 1000 UNITS capsule, Take 2,000 Units by mouth daily., Disp: , Rfl:    clonazePAM  (KLONOPIN ) 0.5 MG tablet, Take 1 tablet by mouth twice daily as needed, Disp: 60 tablet, Rfl: 5   cyanocobalamin  (VITAMIN B12) 1000 MCG/ML injection, Inject into the muscle., Disp: , Rfl:    cyclobenzaprine  (FLEXERIL ) 10 MG tablet, Take 1 tablet by mouth three times daily as needed for muscle spasm, Disp: 60 tablet, Rfl: 3   dimenhyDRINATE (DRAMAMINE) 50 MG tablet, Take 50 mg by mouth every 8 (eight) hours as needed for nausea or dizziness., Disp: , Rfl:    donepezil  (ARICEPT ) 5 MG tablet, TAKE 1 TABLET BY MOUTH AT BEDTIME, Disp: 90 tablet, Rfl: 3   ezetimibe  (ZETIA ) 10 MG tablet, Take 1 tablet by mouth once daily, Disp: 90 tablet, Rfl: 3   FLUoxetine  (PROZAC ) 40 MG capsule, Take 80 mg by mouth M, W, F and 40 mg by mouth Tues, Thurs, Sat and Sunday, Disp: 102 capsule, Rfl: 1   fluticasone  (FLONASE) 50 MCG/ACT nasal spray, Place 2 sprays into both nostrils daily as needed for rhinitis., Disp: , Rfl:    gabapentin  (NEURONTIN ) 100 MG capsule, Take 100 mg by mouth., Disp: , Rfl:    guaiFENesin  (MUCINEX ) 600 MG 12 hr tablet, Take 600 mg by mouth 2 (two) times daily as needed for to loosen phlegm., Disp: , Rfl:    ipratropium-albuterol  (DUONEB) 0.5-2.5 (3) MG/3ML SOLN, Take 3 mLs by nebulization every 6 (six) hours as needed., Disp: 360 mL, Rfl: 3   lisinopril  (ZESTRIL ) 5 MG  tablet, Take 5 mg by mouth daily., Disp: , Rfl:    magnesium  oxide (MAG-OX) 400 (240 Mg) MG tablet, Take 400 mg by mouth daily., Disp: , Rfl:    metoprolol  tartrate (LOPRESSOR ) 25 MG tablet, TAKE 1/2 TABLET TWICE DAILY, Disp: 90 tablet, Rfl: 1   Multiple Vitamin (MULTIVITAMIN) tablet, Take 1 tablet by mouth daily., Disp: , Rfl:    naproxen  sodium (ALEVE ) 220 MG tablet, Take 220 mg by mouth in the morning., Disp: , Rfl:    pantoprazole  (PROTONIX ) 40 MG tablet, TAKE 1 TABLET 30 MINUTES PRIOR TO BREAKFAST, Disp: 90 tablet, Rfl: 3   rosuvastatin  (CRESTOR ) 40 MG tablet, Take 1 tablet by mouth once daily, Disp: 90 tablet, Rfl: 3   Simethicone  125 MG CAPS, Take 125 mg by mouth daily as needed (gas)., Disp: , Rfl:    vitamin E 180 MG (400 UNITS) capsule, Take 400 Units by mouth daily., Disp: , Rfl:    empagliflozin  (JARDIANCE ) 10 MG TABS tablet, Take 1 tablet (10 mg total) by mouth daily. (Patient not taking: Reported on 05/25/2024), Disp: 30 tablet, Rfl: 11    ALLERGIES   Honey bee venom [bee venom] and Diazepam   BP 130/80 (BP Location: Left Arm, Patient Position: Sitting, Cuff Size: Large)   Pulse 95   Temp 98.4 F (36.9 C) (Oral)   Ht 5' 6.5 (1.689 m)   Wt 230 lb 12.8 oz (104.7 kg)   SpO2 95%   BMI 36.69 kg/m    Review of Systems: Gen:  Denies  fever, sweats, chills weight loss  HEENT: Denies blurred vision, double vision, ear pain, eye pain, hearing loss, nose bleeds, sore throat Cardiac:  No dizziness, chest pain or heaviness, chest tightness,edema, No JVD Resp:   No cough, -sputum production, +shortness of breath,+wheezing, -hemoptysis,  Other:  All other systems negative   Physical Examination:   General Appearance: No distress  EYES PERRLA, EOM intact.   NECK Supple, No JVD Pulmonary: normal breath sounds, No wheezing.  CardiovascularNormal S1,S2.  No m/r/g.   Abdomen: Benign, Soft, non-tender. Neurology UE/LE 5/5 strength, no focal deficits Ext pulses intact, cap  refill intact ALL OTHER ROS ARE NEGATIVE      IMAGING    ECHOCARDIOGRAM COMPLETE Result Date: 05/02/2024    ECHOCARDIOGRAM REPORT   Patient Name:   Amanda [name] Date of Exam: 05/02/2024 Medical Rec #:  [id number]        Height:       67.0 in Accession #:    [id number]       Weight:       225.6 lb Date of Birth:  [birth year]        BSA:          2.128 m Patient Age:    72 years         BP:           124/70 mmHg Patient Gender: F                HR:           83 bpm. Exam Location:  [place] Procedure: 2D Echo, Cardiac Doppler and Color Doppler (Both Spectral and Color            Flow Doppler were utilized during procedure). Indications:    S/P TAVR, SoB  History:        Patient has prior history of Echocardiogram examinations. CAD,                 Aortic Valve Disease, Arrythmias:RBBB; Risk Factors:Hypertension                 and Dyslipidemia.  Sonographer:    Alicia Inoue Referring Phys: [id number] RYAN M DUNN IMPRESSIONS  1. Left ventricular ejection fraction, by estimation, is 60 to 65%. Left ventricular ejection fraction by 2D MOD biplane is 60.6 %. The left ventricle has normal function. The left ventricle has no regional wall motion abnormalities. There is mild left ventricular hypertrophy. Left ventricular diastolic parameters are consistent with Grade I diastolic dysfunction (impaired relaxation).  2. Right ventricular systolic function is normal. The right ventricular size is normal.  3. The mitral valve is normal in structure. No evidence of mitral valve regurgitation.  4. The aortic valve has been repaired/replaced. Aortic valve regurgitation is not visualized. Aortic valve mean gradient measures 6.3 mmHg. FINDINGS  Left Ventricle: Left ventricular ejection fraction, by estimation, is 60 to 65%. Left ventricular ejection fraction by 2D MOD biplane is 60.6 %. The left ventricle has normal function. The left ventricle has no regional wall motion abnormalities. The left ventricular internal cavity  size was normal in size. There is mild left ventricular hypertrophy. Left ventricular diastolic parameters are consistent with Grade I diastolic dysfunction (impaired relaxation). Right Ventricle: The right ventricular size is normal. No increase in right ventricular wall thickness. Right ventricular systolic function is normal. Left Atrium: Left atrial size was normal in size. Right Atrium: Right atrial size was normal in size. Pericardium: There is no evidence of pericardial effusion. Mitral Valve: The mitral valve is normal in structure. No evidence of mitral valve regurgitation. Tricuspid Valve: The tricuspid valve is normal  in structure. Tricuspid valve regurgitation is not demonstrated. Aortic Valve: The aortic valve has been repaired/replaced. Aortic valve regurgitation is not visualized. Aortic valve mean gradient measures 6.3 mmHg. Aortic valve peak gradient measures 11.9 mmHg. Aortic valve area, by VTI measures 2.70 cm. There is a Sapien prosthetic, stented (TAVR) valve present in the aortic position. Pulmonic Valve: The pulmonic valve was not well visualized. Pulmonic valve regurgitation is not visualized. Aorta: The aortic root and ascending aorta are structurally normal, with no evidence of dilitation. Venous: The inferior vena cava was not well visualized. IAS/Shunts: No atrial level shunt detected by color flow Doppler.  LEFT VENTRICLE PLAX 2D                        Biplane EF (MOD) LVIDd:         3.90 cm         LV Biplane EF:   Left LVIDs:         2.40 cm                          ventricular LV PW:         1.30 cm                          ejection LV IVS:        1.20 cm                          fraction by LVOT diam:     2.20 cm                          2D MOD LV SV:         84                               biplane is LV SV Index:   39                               60.6 %. LVOT Area:     3.80 cm                                Diastology                                LV e' medial:    5.44 cm/s LV  Volumes (MOD)               LV E/e' medial:  14.2 LV vol d, MOD    33.3 ml       LV e' lateral:   6.74 cm/s A2C:                           LV E/e' lateral: 11.5 LV vol d, MOD    76.1 ml A4C: LV vol s, MOD    14.4 ml A2C: LV vol s, MOD    30.5 ml A4C: LV SV MOD A2C:   18.9 ml LV SV MOD A4C:   76.1 ml LV SV MOD BP:    33.8  ml RIGHT VENTRICLE RV Basal diam:  4.00 cm RV Mid diam:    3.10 cm RV S prime:     11.70 cm/s TAPSE (M-mode): 1.4 cm LEFT ATRIUM             Index        RIGHT ATRIUM           Index LA Vol (A2C):   79.2 ml 37.21 ml/m  RA Area:     11.50 cm LA Vol (A4C):   60.5 ml 28.42 ml/m  RA Volume:   26.50 ml  12.45 ml/m LA Biplane Vol: 70.5 ml 33.12 ml/m  AORTIC VALVE AV Area (Vmax):    2.64 cm AV Area (Vmean):   2.60 cm AV Area (VTI):     2.70 cm AV Vmax:           172.67 cm/s AV Vmean:          115.000 cm/s AV VTI:            0.310 m AV Peak Grad:      11.9 mmHg AV Mean Grad:      6.3 mmHg LVOT Vmax:         120.00 cm/s LVOT Vmean:        78.600 cm/s LVOT VTI:          0.220 m LVOT/AV VTI ratio: 0.71  AORTA Ao Root diam: 2.80 cm Ao Asc diam:  2.60 cm MITRAL VALVE MV Area (PHT): 2.76 cm     SHUNTS MV Decel Time: 275 msec     Systemic VTI:  0.22 m MV E velocity: 77.20 cm/s   Systemic Diam: 2.20 cm MV A velocity: 153.00 cm/s MV E/A ratio:  0.50 Constancia Delton MD Electronically signed by Constancia Delton MD Signature Date/Time: 05/02/2024/5:28:59 PM    Final       ASSESSMENT/PLAN   73 year old pleasant white female seen today for underlying COPD with extensive smoking history in the setting of morbid obesity and deconditioned state with recent hospitalization for acute COPD exacerbation with a history of aortic valve replacement undergoing cardiac rehab  Assessment of shortness of breath Likely diagnosis of COPD Recommend pulmonary function testing Patient is to start her nebulized treatment with Pulmicort  Avoid Allergens and Irritants Avoid secondhand smoke Avoid SICK  contacts Recommend  Masking  when appropriate Recommend Keep up-to-date with vaccinations Ambulating pulse oximetry did not reveal hypoxia  Patient with underlying mild OSA however at this she does not want to continue with CPAP due to claustrophobia and inability to tolerate the machine  Shortness of breath and dyspnea on exertion with dizziness Recommend overnight pulse oximetry to assess for nocturnal hypoxia  Cardiology Follow-up with Dr. Alvenia Aus Continue cardiac rehab  Obesity -recommend significant weight loss -recommend changing diet  Deconditioned state -Recommend increased daily activity and exercise    MEDICATION ADJUSTMENTS/LABS AND TESTS ORDERED: Recommend starting your Pulmicort  nebulizers Recommend obtaining pulmonary function testing to assess for lung function Recommend lung cancer screening program referral Will need to check oxygen levels at nighttime Avoid Allergens and Irritants Avoid secondhand smoke Avoid SICK contacts Recommend  Masking  when appropriate Recommend Keep up-to-date with vaccinations   CURRENT MEDICATIONS REVIEWED AT LENGTH WITH PATIENT TODAY   Patient  satisfied with Plan of action and management. All questions answered  Follow up 3 months  I spent a total of 75 minutes reviewing chart data, face-to-face evaluation with the patient, counseling and coordination of care as detailed above.  Lady Pier, M.D.  [name]  Medical Director [facility] Medical Director [facility] Cardio-Pulmonary Department

## 2024-05-25 NOTE — Patient Instructions (Addendum)
 Recommend starting your Pulmicort  nebulizers Recommend obtaining pulmonary function testing to assess for lung function Recommend lung cancer screening program referral Will need to check oxygen levels at nighttime  Avoid Allergens and Irritants Avoid secondhand smoke Avoid SICK contacts Recommend  Masking  when appropriate Recommend Keep up-to-date with vaccinations

## 2024-05-29 ENCOUNTER — Other Ambulatory Visit: Payer: Self-pay | Admitting: Cardiovascular Disease

## 2024-05-29 ENCOUNTER — Encounter: Admitting: *Deleted

## 2024-05-29 ENCOUNTER — Other Ambulatory Visit: Payer: Self-pay | Admitting: Family Medicine

## 2024-05-29 DIAGNOSIS — J449 Chronic obstructive pulmonary disease, unspecified: Secondary | ICD-10-CM | POA: Diagnosis not present

## 2024-05-29 DIAGNOSIS — Z5189 Encounter for other specified aftercare: Secondary | ICD-10-CM | POA: Diagnosis not present

## 2024-05-29 DIAGNOSIS — Z952 Presence of prosthetic heart valve: Secondary | ICD-10-CM

## 2024-05-29 DIAGNOSIS — F17200 Nicotine dependence, unspecified, uncomplicated: Secondary | ICD-10-CM | POA: Diagnosis not present

## 2024-05-29 DIAGNOSIS — Z48812 Encounter for surgical aftercare following surgery on the circulatory system: Secondary | ICD-10-CM | POA: Diagnosis not present

## 2024-05-29 NOTE — Progress Notes (Signed)
 Cardiac Individual Treatment Plan  Patient Details  Name: Amanda [name] MRN: [id number] Date of Birth: 1951-05-08 Referring Provider:   Flowsheet Row Cardiac Rehab from 02/21/2024 in [facility] Cardiac and Pulmonary Rehab  Referring Provider Dr. Deatrice Cage, MD    Initial Encounter Date:  Flowsheet Row Cardiac Rehab from 02/21/2024 in [facility] Cardiac and Pulmonary Rehab  Date 02/21/24    Visit Diagnosis: S/P TAVR (transcatheter aortic valve replacement)  Patient's Home Medications on Admission:  Current Outpatient Medications:    acetaminophen  (TYLENOL ) 500 MG tablet, Take 1,000 mg by mouth every 8 (eight) hours as needed for mild pain (pain score 1-3) or moderate pain (pain score 4-6)., Disp: , Rfl:    albuterol  (VENTOLIN  HFA) 108 (90 Base) MCG/ACT inhaler, Inhale 2 puffs into the lungs every 4 (four) hours as needed for wheezing or shortness of breath., Disp: 3 each, Rfl: 2   amitriptyline  (ELAVIL ) 50 MG tablet, TAKE 1/2 (ONE-HALF) TABLET BY MOUTH AT BEDTIME, Disp: 45 tablet, Rfl: 1   Ascorbic Acid  (VITAMIN C ) 1000 MG tablet, Take 1,000 mg by mouth daily., Disp: , Rfl:    aspirin  EC 81 MG tablet, Take 81 mg by mouth daily., Disp: , Rfl:    budesonide  (PULMICORT ) 0.5 MG/2ML nebulizer solution, Take 2 mLs (0.5 mg total) by nebulization 2 (two) times daily., Disp: 360 mL, Rfl: 3   buPROPion  (WELLBUTRIN  XL) 150 MG 24 hr tablet, Take 1 tablet by mouth once daily, Disp: 90 tablet, Rfl: 1   calcium  carbonate (OS-CAL - DOSED IN MG OF ELEMENTAL CALCIUM ) 1250 (500 Ca) MG tablet, Take 1 tablet by mouth daily at 6 (six) AM., Disp: , Rfl:    calcium  carbonate (TUMS - DOSED IN MG ELEMENTAL CALCIUM ) 500 MG chewable tablet, Chew 2 tablets by mouth daily as needed for indigestion or heartburn., Disp: , Rfl:    Cholecalciferol  (VITAMIN D ) 1000 UNITS capsule, Take 2,000 Units by mouth daily., Disp: , Rfl:    clonazePAM  (KLONOPIN ) 0.5 MG tablet, Take 1 tablet by mouth twice daily as needed, Disp: 60 tablet,  Rfl: 5   cyanocobalamin  (VITAMIN B12) 1000 MCG/ML injection, Inject into the muscle., Disp: , Rfl:    cyclobenzaprine  (FLEXERIL ) 10 MG tablet, Take 1 tablet by mouth three times daily as needed for muscle spasm, Disp: 60 tablet, Rfl: 3   dimenhyDRINATE (DRAMAMINE) 50 MG tablet, Take 50 mg by mouth every 8 (eight) hours as needed for nausea or dizziness., Disp: , Rfl:    donepezil  (ARICEPT ) 5 MG tablet, TAKE 1 TABLET BY MOUTH AT BEDTIME, Disp: 90 tablet, Rfl: 3   empagliflozin  (JARDIANCE ) 10 MG TABS tablet, Take 1 tablet (10 mg total) by mouth daily. (Patient not taking: Reported on 05/25/2024), Disp: 30 tablet, Rfl: 11   ezetimibe  (ZETIA ) 10 MG tablet, Take 1 tablet by mouth once daily, Disp: 90 tablet, Rfl: 3   FLUoxetine  (PROZAC ) 40 MG capsule, Take 80 mg by mouth M, W, F and 40 mg by mouth Tues, Thurs, Sat and Sunday, Disp: 102 capsule, Rfl: 1   fluticasone  (FLONASE) 50 MCG/ACT nasal spray, Place 2 sprays into both nostrils daily as needed for rhinitis., Disp: , Rfl:    gabapentin  (NEURONTIN ) 100 MG capsule, Take 100 mg by mouth., Disp: , Rfl:    guaiFENesin  (MUCINEX ) 600 MG 12 hr tablet, Take 600 mg by mouth 2 (two) times daily as needed for to loosen phlegm., Disp: , Rfl:    ipratropium-albuterol  (DUONEB) 0.5-2.5 (3) MG/3ML SOLN, Take 3 mLs by nebulization  every 6 (six) hours as needed., Disp: 360 mL, Rfl: 3   lisinopril  (ZESTRIL ) 5 MG tablet, Take 5 mg by mouth daily., Disp: , Rfl:    magnesium  oxide (MAG-OX) 400 (240 Mg) MG tablet, Take 400 mg by mouth daily., Disp: , Rfl:    metoprolol  tartrate (LOPRESSOR ) 25 MG tablet, TAKE 1/2 TABLET TWICE DAILY, Disp: 90 tablet, Rfl: 1   Multiple Vitamin (MULTIVITAMIN) tablet, Take 1 tablet by mouth daily., Disp: , Rfl:    naproxen  sodium (ALEVE ) 220 MG tablet, Take 220 mg by mouth in the morning., Disp: , Rfl:    pantoprazole  (PROTONIX ) 40 MG tablet, TAKE 1 TABLET BY MOUTH ONCE DAILY 30  MINUTES  PRIOR  TO  BREAKFAST, Disp: 90 tablet, Rfl: 1    rosuvastatin  (CRESTOR ) 40 MG tablet, Take 1 tablet by mouth once daily, Disp: 90 tablet, Rfl: 3   Simethicone  125 MG CAPS, Take 125 mg by mouth daily as needed (gas)., Disp: , Rfl:    vitamin E 180 MG (400 UNITS) capsule, Take 400 Units by mouth daily., Disp: , Rfl:   Past Medical History: Past Medical History:  Diagnosis Date   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 sting    Aortic stenosis    a. 2013: nl LV sys fxn, mild MR, no evidence of pulm htn; b. TTE 8/17: EF 60-65%, no RWMA, nl LV dia fxn,  mild AS, mod AI   CAD (coronary artery disease), native coronary artery 11/26/2020   Controlled type 2 diabetes mellitus with diabetic autonomic neuropathy, without long-term current use of insulin  (HCC) 08/10/2007   COPD (chronic obstructive pulmonary disease) (HCC) 10/30/2016   Depression    GAD (generalized anxiety disorder)    GERD (gastroesophageal reflux disease)    Hiatal hernia with gastroesophageal reflux 1997   Hyperlipidemia    Hypertension    Osteoporosis    S/P TAVR (transcatheter aortic valve replacement) 12/21/2023   s/p TAVR with a 34 mm Evolut FX via the TF approach by Dr. Wendel & Dr. Maryjane   Tobacco abuse     Tobacco Use: Social History   Tobacco Use  Smoking Status Former   Average packs/day: 0.8 packs/day for 45.0 years (33.8 ttl pk-yrs)   Types: Cigarettes   Start date: 01/2023  Smokeless Tobacco Never  Tobacco Comments   Does not on starting back smoking.    Labs: Review Flowsheet  More data exists      Latest Ref Rng & Units 01/28/2023 10/04/2023 12/21/2023 03/29/2024 04/05/2024  Labs for ITP Cardiac and Pulmonary Rehab  Cholestrol 100 - 199 mg/dL 861  - - - 858   LDL (calc) 0 - 99 mg/dL 57  - - - 48   HDL-C >60 mg/dL 37.59  - - - 60   Trlycerides 0 - 149 mg/dL 05.9  - - - 788   Hemoglobin A1c 4.8 - 5.6 % 6.4  - - 6.9  -  PH, Arterial 7.35 - 7.45 - 7.353  - 7.37  -  PCO2 arterial 32 - 48 mmHg - 44.6  - 46  -  Bicarbonate 20.0 - 28.0 mmol/L - 25.2  24.8  - 26.6  -  TCO2  22 - 32 mmol/L - 27  26  26   - -  Acid-base deficit 0.0 - 2.0 mmol/L - 1.0  1.0  - - -  O2 Saturation % - 69  95  - 99.1  -    Details       Multiple values from one  day are sorted in reverse-chronological order          Exercise Target Goals: Exercise Program Goal: Individual exercise prescription set using results from initial 6 min walk test and THRR while considering  patient's activity barriers and safety.   Exercise Prescription Goal: Initial exercise prescription builds to 30-45 minutes a day of aerobic activity, 2-3 days per week.  Home exercise guidelines will be given to patient during program as part of exercise prescription that the participant will acknowledge.   Education: Aerobic Exercise: - Group verbal and visual presentation on the components of exercise prescription. Introduces F.I.T.T principle from ACSM for exercise prescriptions.  Reviews F.I.T.T. principles of aerobic exercise including progression. Written material given at graduation.   Education: Resistance Exercise: - Group verbal and visual presentation on the components of exercise prescription. Introduces F.I.T.T principle from ACSM for exercise prescriptions  Reviews F.I.T.T. principles of resistance exercise including progression. Written material given at graduation. Flowsheet Row Cardiac Rehab from 05/10/2024 in [facility] Cardiac and Pulmonary Rehab  Date 04/26/24  Educator [facility]  Instruction Review Code 1- [employer] Understanding     Education: Exercise & Equipment Safety: - Individual verbal instruction and demonstration of equipment use and safety with use of the equipment. Flowsheet Row Cardiac Rehab from 05/10/2024 in [facility] Cardiac and Pulmonary Rehab  Date 02/07/24  Educator [facility]  Instruction Review Code 1- Verbalizes Understanding    Education: Exercise Physiology & General Exercise Guidelines: - Group verbal and written instruction with models to review the exercise physiology of the  cardiovascular system and associated critical values. Provides general exercise guidelines with specific guidelines to those with heart or lung disease.  Flowsheet Row Cardiac Rehab from 05/10/2024 in [facility] Cardiac and Pulmonary Rehab  Date 04/19/24  Educator [facility]  Instruction Review Code 1- [employer] Understanding    Education: Flexibility, Balance, Mind/Body Relaxation: - Group verbal and visual presentation with interactive activity on the components of exercise prescription. Introduces F.I.T.T principle from ACSM for exercise prescriptions. Reviews F.I.T.T. principles of flexibility and balance exercise training including progression. Also discusses the mind body connection.  Reviews various relaxation techniques to help reduce and manage stress (i.e. Deep breathing, progressive muscle relaxation, and visualization). Balance handout provided to take home. Written material given at graduation. Flowsheet Row Cardiac Rehab from 05/10/2024 in [facility] Cardiac and Pulmonary Rehab  Date 04/26/24  Educator [facility]  Instruction Review Code 1- Verbalizes Understanding    Activity Barriers & Risk Stratification:  Activity Barriers & Cardiac Risk Stratification - 02/21/24 1558       Activity Barriers & Cardiac Risk Stratification   Activity Barriers History of Falls;Balance Concerns;Left Knee Replacement;Right Knee Replacement;Back Problems;Neck/Spine Problems;Muscular Weakness;Deconditioning;Assistive Device    Cardiac Risk Stratification Low          6 Minute Walk:  6 Minute Walk     Row Name 02/21/24 1557 05/18/24 1403       6 Minute Walk   Phase Initial Discharge    Distance 580 feet 970 feet    Distance % Change -- 67 %    Distance Feet Change -- 390 ft    Walk Time 6 minutes 6 minutes    # of Rest Breaks 0 0    MPH 1.1 1.8    METS 1 1.77    RPE 11 12    Perceived Dyspnea  1 1    VO2 Peak 3.21 6.19    Symptoms Yes (comment) Yes (comment)    Comments leg fatigue, used walker leg  fatigue,  used walker    Resting HR 65 bpm 76 bpm    Resting BP 102/56 118/68    Resting Oxygen Saturation  96 % 96 %    Exercise Oxygen Saturation  during 6 min walk 94 % 95 %    Max Ex. HR 96 bpm 101 bpm    Max Ex. BP 138/70 156/58    2 Minute Post BP 134/68 --       Oxygen Initial Assessment:   Oxygen Re-Evaluation:   Oxygen Discharge (Final Oxygen Re-Evaluation):   Initial Exercise Prescription:  Initial Exercise Prescription - 02/21/24 1600       Date of Initial Exercise RX and Referring Provider   Date 02/21/24    Referring Provider Dr. Deatrice Cage, MD      Oxygen   Maintain Oxygen Saturation 88% or higher      Recumbant Bike   Level 1    RPM 50    Watts 15    Minutes 15    METs 1      NuStep   Level 1    SPM 80    Minutes 15    METs 1      Track   Laps 15    Minutes 15    METs 1.82      Prescription Details   Frequency (times per week) 3    Duration Progress to 30 minutes of continuous aerobic without signs/symptoms of physical distress      Intensity   THRR 40-80% of Max Heartrate 97-130    Ratings of Perceived Exertion 11-13    Perceived Dyspnea 0-4      Progression   Progression Continue to progress workloads to maintain intensity without signs/symptoms of physical distress.      Resistance Training   Training Prescription Yes    Weight 3 lb    Reps 10-15          Perform Capillary Blood Glucose checks as needed.  Exercise Prescription Changes:   Exercise Prescription Changes     Row Name 02/21/24 1600 03/09/24 0800 03/22/24 1300 04/06/24 1000 04/17/24 1400     Response to Exercise   Blood Pressure (Admit) 102/56 128/60 124/60 122/62 --   Blood Pressure (Exercise) 138/70 144/70 162/80 148/74 --   Blood Pressure (Exit) 134/68 118/62 128/64 116/58 --   Heart Rate (Admit) 65 bpm 82 bpm 103 bpm 106 bpm --   Heart Rate (Exercise) 96 bpm 106 bpm 124 bpm 129 bpm --   Heart Rate (Exit) 78 bpm 79 bpm 93 bpm 111 bpm --   Oxygen Saturation  (Admit) 96 % -- -- -- --   Oxygen Saturation (Exercise) 94 % -- -- -- --   Rating of Perceived Exertion (Exercise) 11 14 13 15  --   Perceived Dyspnea (Exercise) 1 -- -- -- --   Symptoms leg fatgue, used walker none none none --   Comments Results First 2 weeks of exercise sessions -- -- --   Duration -- Continue with 30 min of aerobic exercise without signs/symptoms of physical distress. Continue with 30 min of aerobic exercise without signs/symptoms of physical distress. Continue with 30 min of aerobic exercise without signs/symptoms of physical distress. --   Intensity -- THRR unchanged THRR unchanged THRR unchanged --     Progression   Progression -- Continue to progress workloads to maintain intensity without signs/symptoms of physical distress. Continue to progress workloads to maintain intensity without signs/symptoms of physical distress. Continue  to progress workloads to maintain intensity without signs/symptoms of physical distress. --   Average METs -- 2.24 2.5 2.19 --     Resistance Training   Training Prescription -- Yes Yes Yes --   Weight -- 3 lb 3 lb 3 lb --   Reps -- 10-15 10-15 10-15 --     Interval Training   Interval Training -- No No No --     Recumbant Bike   Level -- 2 3 2  --   Watts -- 18 15 15  --   Minutes -- 15 15 15  --   METs -- 2.45 2.44 2 --     NuStep   Level -- 2 4 -- --   Minutes -- 15 15 -- --   METs -- 2.4 2.7 -- --     Biostep-RELP   Level -- -- 2 2 --   Minutes -- -- 15 15 --   METs -- -- 2 3 --     Track   Laps -- -- -- 14 --   Minutes -- -- -- 15 --   METs -- -- -- 1.76 --     Home Exercise Plan   Plans to continue exercise at -- -- -- -- Home (comment)  Walking to mailbox and yard perimeter (increasing duration), exercises for back   Frequency -- -- -- -- Add 2 additional days to program exercise sessions.   Initial Home Exercises Provided -- -- -- -- 04/17/24     Oxygen   Maintain Oxygen Saturation -- 88% or higher 88% or  higher 88% or higher --    Row Name 04/20/24 0800 05/04/24 1800 05/18/24 1600         Response to Exercise   Blood Pressure (Admit) 124/62 130/68 122/72     Blood Pressure (Exit) 108/54 108/68 118/66     Heart Rate (Admit) 94 bpm 93 bpm 78 bpm     Heart Rate (Exercise) 115 bpm 120 bpm 105 bpm     Heart Rate (Exit) 102 bpm 90 bpm 88 bpm     Oxygen Saturation (Admit) -- 95 % --     Oxygen Saturation (Exercise) -- 92 % --     Oxygen Saturation (Exit) -- 95 % --     Rating of Perceived Exertion (Exercise) 13 15 13      Symptoms none none none     Duration Continue with 30 min of aerobic exercise without signs/symptoms of physical distress. Continue with 30 min of aerobic exercise without signs/symptoms of physical distress. Continue with 30 min of aerobic exercise without signs/symptoms of physical distress.     Intensity THRR unchanged THRR unchanged THRR unchanged       Progression   Progression Continue to progress workloads to maintain intensity without signs/symptoms of physical distress. Continue to progress workloads to maintain intensity without signs/symptoms of physical distress. Continue to progress workloads to maintain intensity without signs/symptoms of physical distress.     Average METs 2.32 2.39 2.73       Resistance Training   Training Prescription Yes Yes Yes     Weight 4 lb 4 lb 4 lb     Reps 10-15 10-15 10-15       Interval Training   Interval Training No No No       Recumbant Bike   Level 2 3 3      Watts 25 25 29      Minutes 15 15 15      METs 2 2  2       NuStep   Level 3 3 3      Minutes 15 15 15      METs 2.6 2.9 3.1       T5 Nustep   Level -- 3 --     SPM -- 80 --     Minutes -- 15 --     METs -- 2.1 --       Biostep-RELP   Level -- 2 3     Minutes -- 15 15     METs -- 3 2       Track   Laps 24 -- 26     Minutes 15 -- 15     METs 2.31 -- 2.41       Home Exercise Plan   Plans to continue exercise at Home (comment)  Walking to mailbox and  yard perimeter (increasing duration), exercises for back Home (comment)  Walking to mailbox and yard perimeter (increasing duration), exercises for back Home (comment)  Walking to [employer] and yard perimeter (increasing duration), exercises for back     Frequency Add 2 additional days to program exercise sessions. Add 2 additional days to program exercise sessions. Add 2 additional days to program exercise sessions.     Initial Home Exercises Provided 04/17/24 04/17/24 04/17/24       Oxygen   Maintain Oxygen Saturation 88% or higher 88% or higher 88% or higher        Exercise Comments:   Exercise Goals and Review:   Exercise Goals     Row Name 02/21/24 1558             Exercise Goals   Increase Physical Activity Yes       Intervention Provide advice, education, support and counseling about physical activity/exercise needs.;Develop an individualized exercise prescription for aerobic and resistive training based on initial evaluation findings, risk stratification, comorbidities and participant's personal goals.       Expected Outcomes Short Term: Attend rehab on a regular basis to increase amount of physical activity.;Long Term: Exercising regularly at least 3-5 days a week.;Long Term: Add in home exercise to make exercise part of routine and to increase amount of physical activity.       Increase Strength and Stamina Yes       Intervention Provide advice, education, support and counseling about physical activity/exercise needs.;Develop an individualized exercise prescription for aerobic and resistive training based on initial evaluation findings, risk stratification, comorbidities and participant's personal goals.       Expected Outcomes Short Term: Increase workloads from initial exercise prescription for resistance, speed, and METs.;Short Term: Perform resistance training exercises routinely during rehab and add in resistance training at home;Long Term: Improve cardiorespiratory fitness,  muscular endurance and strength as measured by increased METs and functional capacity ( )       Able to understand and use rate of perceived exertion (RPE) scale Yes       Intervention Provide education and explanation on how to use RPE scale       Expected Outcomes Short Term: Able to use RPE daily in rehab to express subjective intensity level;Long Term:  Able to use RPE to guide intensity level when exercising independently       Able to understand and use Dyspnea scale Yes       Intervention Provide education and explanation on how to use Dyspnea scale       Expected Outcomes Short Term: Able to use Dyspnea scale daily  in rehab to express subjective sense of shortness of breath during exertion;Long Term: Able to use Dyspnea scale to guide intensity level when exercising independently       Knowledge and understanding of Target Heart Rate Range (THRR) Yes       Intervention Provide education and explanation of THRR including how the numbers were predicted and where they are located for reference       Expected Outcomes Short Term: Able to state/look up THRR;Long Term: Able to use THRR to govern intensity when exercising independently;Short Term: Able to use daily as guideline for intensity in rehab       Able to check pulse independently Yes       Intervention Provide education and demonstration on how to check pulse in carotid and radial arteries.;Review the importance of being able to check your own pulse for safety during independent exercise       Expected Outcomes Short Term: Able to explain why pulse checking is important during independent exercise;Long Term: Able to check pulse independently and accurately       Understanding of Exercise Prescription Yes       Intervention Provide education, explanation, and written materials on patient's individual exercise prescription       Expected Outcomes Short Term: Able to explain program exercise prescription;Long Term: Able to explain home  exercise prescription to exercise independently          Exercise Goals Re-Evaluation :  Exercise Goals Re-Evaluation     Row Name 03/09/24 0804 03/22/24 1346 04/06/24 1014 04/17/24 1431 04/20/24 0831     Exercise Goal Re-Evaluation   Exercise Goals Review Increase Physical Activity;Understanding of Exercise Prescription;Increase Strength and Stamina Increase Physical Activity;Understanding of Exercise Prescription;Increase Strength and Stamina Increase Physical Activity;Understanding of Exercise Prescription;Increase Strength and Stamina Increase Physical Activity;Able to understand and use Dyspnea scale;Understanding of Exercise Prescription;Increase Strength and Stamina;Knowledge and understanding of Target Heart Rate Range (THRR);Able to understand and use rate of perceived exertion (RPE) scale;Able to check pulse independently Increase Physical Activity;Increase Strength and Stamina;Understanding of Exercise Prescription   Comments Huntley is off to a good start in the program and completed her first 2 weeks of exercise sessions in this review. She tolerated her exercise prescription well and increased workloads. She increased to level 2 on the recumbent bike and to level 2 on the T4 nustep. We will continue to monitor her progress in the program. Navina is doing well in the program. She was recently able to increase her level on the recumbent bike from level 2 to 3. She was also able to increase from level 2 to level 4 on the T4 nustep. We will continue to monitor her progress in the program. Mali is doing well in the program. She was able to do level 2 on the biostep and recumbent bike. She also did 14 laps on the track. She had been in the hospital and discharged in this review and has yet to recieve clearance to return to rehab as of 4/28. We will continue to monitor her progress in the program when she is able to return. Reviewed home exercise with pt today.  Pt plans to walk to her mailbox  and add in her yard perimeter and increase the duration of this along with her back exercises for exercise. She plans to add in 2 additional days of home exercise. Reviewed THR, pulse, RPE, sign and symptoms, pulse oximetery and when to call 911 or MD.  Also discussed weather considerations  and indoor options.  Pt voiced understanding. Chasity is doing well in rehab. She recently increased her number of laps walked on the track to 24 laps. She also increased to 4 lb hand weights for resistance training and continues to work at level 3 on the T4 nustep. We will continue to monitor her progress in the program.   Expected Outcomes Short: Continue to follow current exercise prescription. Long: Continue exercise to improve strength and stamina. Short: Continue to increase levels on the T4 nustep and Recumbent bike. Long: Continue exercise to improve strength and stamina. Short: Return to rehab when able. Long: Continue exercise to improve strength and stamina. Short: Add 2 additional days of exercise at home. Long: Continue to exercise independently Short: Continue to push for more laps on the track. Long: Continue exercise to improve strength and stamina.    Row Name 05/04/24 1827 05/18/24 1612           Exercise Goal Re-Evaluation   Exercise Goals Review Increase Physical Activity;Increase Strength and Stamina;Understanding of Exercise Prescription Increase Physical Activity;Increase Strength and Stamina;Understanding of Exercise Prescription      Comments Dajanae is doing well in rehab. She is due for her post soon and hopes to see improvement. She increased to level 3 on the recumbent bike. She maintained level 3 on the T4 and T5 nustep and level 2 on the biostep. We will continue to monitor her progress in the program. Anthony continues to do well in the program. She recently increased her walking distance to 26 laps on the track! She also improved to level 3 on the biostep and continues to work at  level 3 on the recumbent bike and T4 nustep. We will continue to monitor her progress in the program.      Expected Outcomes Short: Improve on post . Long: Continue to increase overall METs and stamina. Short: Improve on post . Long: Continue to increase overall METs and stamina.         Discharge Exercise Prescription (Final Exercise Prescription Changes):  Exercise Prescription Changes - 05/18/24 1600       Response to Exercise   Blood Pressure (Admit) 122/72    Blood Pressure (Exit) 118/66    Heart Rate (Admit) 78 bpm    Heart Rate (Exercise) 105 bpm    Heart Rate (Exit) 88 bpm    Rating of Perceived Exertion (Exercise) 13    Symptoms none    Duration Continue with 30 min of aerobic exercise without signs/symptoms of physical distress.    Intensity THRR unchanged      Progression   Progression Continue to progress workloads to maintain intensity without signs/symptoms of physical distress.    Average METs 2.73      Resistance Training   Training Prescription Yes    Weight 4 lb    Reps 10-15      Interval Training   Interval Training No      Recumbant Bike   Level 3    Watts 29    Minutes 15    METs 2      NuStep   Level 3    Minutes 15    METs 3.1      Biostep-RELP   Level 3    Minutes 15    METs 2      Track   Laps 26    Minutes 15    METs 2.41      Home Exercise Plan   Plans to  continue exercise at Home (comment)   Walking to mailbox and yard perimeter (increasing duration), exercises for back   Frequency Add 2 additional days to program exercise sessions.    Initial Home Exercises Provided 04/17/24      Oxygen   Maintain Oxygen Saturation 88% or higher          Nutrition:  Target Goals: Understanding of nutrition guidelines, daily intake of sodium 1500mg , cholesterol 200mg , calories 30% from fat and 7% or less from saturated fats, daily to have 5 or more servings of fruits and vegetables.  Education: All About Nutrition: -Group  instruction provided by verbal, written material, interactive activities, discussions, models, and posters to present general guidelines for heart healthy nutrition including fat, fiber, MyPlate, the role of sodium in heart healthy nutrition, utilization of the nutrition label, and utilization of this knowledge for meal planning. Follow up email sent as well. Written material given at graduation. Flowsheet Row Cardiac Rehab from 05/10/2024 in [facility] Cardiac and Pulmonary Rehab  Date 05/10/24  junie 2]  Educator DETRA part 1  Instruction Review Code 1- Verbalizes Understanding    Biometrics:  Pre Biometrics - 02/21/24 1559       Pre Biometrics   Height 5' 6.5 (1.689 m)    Weight 235 lb 1.6 oz (106.6 kg)    Waist Circumference 47.5 inches    Hip Circumference 53 inches    Waist to Hip Ratio 0.9 %    BMI (Calculated) 37.38    Single Leg Stand 0 seconds   unable to complete         Post Biometrics - 05/18/24 1406        Post  Biometrics   Height 5' 6.5 (1.689 m)    Weight 230 lb 9.6 oz (104.6 kg)    Waist Circumference 45 inches    Hip Circumference 53 inches    Waist to Hip Ratio 0.85 %    BMI (Calculated) 36.67    Single Leg Stand 2.32 seconds          Nutrition Therapy Plan and Nutrition Goals:  Nutrition Therapy & Goals - 02/23/24 1612       Nutrition Therapy   Diet Carb controlled, Cardiac, Low Na    Protein (specify units) 60-90    Fiber 25 grams    Whole Grain Foods 3 servings    Saturated Fats 15 max. grams    Fruits and Vegetables 5 servings/day    Sodium 2 grams      Personal Nutrition Goals   Nutrition Goal Make plates with more veggies, make daughter healthy plates    Personal Goal #2 Read labels and reduce sodium intake to below 2300mg . Ideally 1500mg  per day.    Personal Goal #3 Eat 15-30gProtein and 30-60gCarbs at each meal.    Comments Patient drinking mostly water, she has DM and doesn't drink any sugary beverages. She is looking to lose weight and eat  heart healthy. She used to make meals for her and her husband that were made of a protein, a carb, and a veggie. When her husband passed in late November she received lots of food, then with the holiday she got more. Her freezer was full and she has been eating out of it for the past few months. But now that she is close to finishing off that food, she is having to adjust to her new routine living with her daughter and new kitchen appliances. Provided counseling, support and encouragement. Reviewed a few  facts labels and brainstormed ways to get back into healthier eating centered around more veggies and less high calorie foods. She likes veggies and says she could work on eating more. Discussed building plates for her and her daughter with more low calorie foods and still having rooms for some of their favorites like pizza and venison burgers.      Intervention Plan   Intervention Prescribe, educate and counsel regarding individualized specific dietary modifications aiming towards targeted core components such as weight, hypertension, lipid management, diabetes, heart failure and other comorbidities.;Nutrition handout(s) given to patient.    Expected Outcomes Short Term Goal: Understand basic principles of dietary content, such as calories, fat, sodium, cholesterol and nutrients.;Short Term Goal: A plan has been developed with personal nutrition goals set during dietitian appointment.;Long Term Goal: Adherence to prescribed nutrition plan.          Nutrition Assessments:  MEDIFICTS Score Key: >=70 Need to make dietary changes  40-70 Heart Healthy Diet <= 40 Therapeutic Level Cholesterol Diet   Picture Your Plate Scores: <59 Unhealthy dietary pattern with much room for improvement. 41-50 Dietary pattern unlikely to meet recommendations for good health and room for improvement. 51-60 More healthful dietary pattern, with some room for improvement.  >60 Healthy dietary pattern, although there may  be some specific behaviors that could be improved.    Nutrition Goals Re-Evaluation:  Nutrition Goals Re-Evaluation     Row Name 04/10/24 1409 04/19/24 1457           Goals   Comment Maly is still working on eating less salt and sugar. Her daughter has been cooking a lot of her food lately. She says her daughter watches her salt and sugar intake. Encouraged her to eat 3 times per day, include protein rich foods and colorful produce when able She is focusing right now on decreasing her sodium intake, reading food labels and not using salt shaker.  She is still eating her peppermint candies more than I should.  She is working on ways to keep from eating more than she should every day.      Expected Outcome STG: Eat small frequent meals. LTG: follow a heart healthy diet and support increased needs STG continue with sodium restrictions, keep working on less candy consuption, find a way to only have her daily serving available.   LTG continues with sodium restrictions, lower  daily candy consumption        Personal Goal #2 Re-Evaluation   Personal Goal #2 -- Read labels and reduce sodium intake to below 2300mg . Ideally 1500mg  per day.         Nutrition Goals Discharge (Final Nutrition Goals Re-Evaluation):  Nutrition Goals Re-Evaluation - 04/19/24 1457       Goals   Comment She is focusing right now on decreasing her sodium intake, reading food labels and not using salt shaker.  She is still eating her peppermint candies more than I should.  She is working on ways to keep from eating more than she should every day.    Expected Outcome STG continue with sodium restrictions, keep working on less candy consuption, find a way to only have her daily serving available.   LTG continues with sodium restrictions, lower  daily candy consumption      Personal Goal #2 Re-Evaluation   Personal Goal #2 Read labels and reduce sodium intake to below 2300mg . Ideally 1500mg  per day.           Psychosocial: Target Goals: Acknowledge  presence or absence of significant depression and/or stress, maximize coping skills, provide positive support system. Participant is able to verbalize types and ability to use techniques and skills needed for reducing stress and depression.   Education: Stress, Anxiety, and Depression - Group verbal and visual presentation to define topics covered.  Reviews how body is impacted by stress, anxiety, and depression.  Also discusses healthy ways to reduce stress and to treat/manage anxiety and depression.  Written material given at graduation. Flowsheet Row Cardiac Rehab from 05/10/2024 in [facility] Cardiac and Pulmonary Rehab  Date 04/12/24  Educator SB  Instruction Review Code 1- [employer] Understanding    Education: Sleep Hygiene -Provides group verbal and written instruction about how sleep can affect your health.  Define sleep hygiene, discuss sleep cycles and impact of sleep habits. Review good sleep hygiene tips.    Initial Review & Psychosocial Screening:  Initial Psych Review & Screening - 02/07/24 1207       Initial Review   Current issues with Current Depression;History of Depression;Current Anxiety/Panic;Current Psychotropic Meds      Family Dynamics   Good Support System? Yes    Comments Cara has been on medication for years for her depression and anxiety. Her husband died in 2024-10-13 and she had her health delice with her valve replacement. She can look to her daughter and son for support.      Barriers   Psychosocial barriers to participate in program The patient should benefit from training in stress management and relaxation.;There are no identifiable barriers or psychosocial needs.      Screening Interventions   Interventions Provide feedback about the scores to participant;To provide support and resources with identified psychosocial needs;Encouraged to exercise    Expected Outcomes Short Term goal: Utilizing psychosocial  counselor, staff and physician to assist with identification of specific Stressors or current issues interfering with healing process. Setting desired goal for each stressor or current issue identified.;Long Term Goal: Stressors or current issues are controlled or eliminated.;Short Term goal: Identification and review with participant of any Quality of Life or Depression concerns found by scoring the questionnaire.;Long Term goal: The participant improves quality of Life and PHQ9 Scores as seen by post scores and/or verbalization of changes          Quality of Life Scores:   Scores of 19 and below usually indicate a poorer quality of life in these areas.  A difference of  2-3 points is a clinically meaningful difference.  A difference of 2-3 points in the total score of the Quality of Life Index has been associated with significant improvement in overall quality of life, self-image, physical symptoms, and general health in studies assessing change in quality of life.  PHQ-9: Review Flowsheet  More data exists      04/19/2024 04/10/2024 04/05/2024 02/21/2024 01/28/2023  Depression screen PHQ 2/9  Decreased Interest 1 3 3 3 3 2   Down, Depressed, Hopeless 1 3 3 3 3 3   PHQ - 2 Score 2 6 6 6 6 5   Altered sleeping 1 3 3 3 3 3   Tired, decreased energy 1 3 3 3 3 3   Change in appetite 0 3 3 3 3 3   Feeling bad or failure about yourself  3 3 3 2 3 1   Trouble concentrating 0 3 3 3 3 3   Moving slowly or fidgety/restless 1 0 0 3 0 2  Suicidal thoughts 0 0 0 0 0 0  PHQ-9 Score 8 21 21 23 21  20  Difficult doing work/chores Very difficult Extremely dIfficult Extremely dIfficult Extremely dIfficult Very difficult Very difficult    Details       Multiple values from one day are sorted in reverse-chronological order        Interpretation of Total Score  Total Score Depression Severity:  1-4 = Minimal depression, 5-9 = Mild depression, 10-14 = Moderate depression, 15-19 = Moderately severe depression,  20-27 = Severe depression   Psychosocial Evaluation and Intervention:  Psychosocial Evaluation - 04/19/24 1452       Psychosocial Evaluation & Interventions   Interventions Encouraged to exercise with the program and follow exercise prescription    Comments Sylvan continue to take her meds for depression/anxiety. She said that it has been worse the past few weeks as her daughter living wioth her is not helping as much as she should.  This has caused other fmily to get upset with her and this upsets Symphanie. She has started smoking again 1-3 cigarettes a day.   Her sleep pattern is improving some, not back to full normal. She has had a night she slept through the night without waking up around 3 am.    Expected Outcomes STG continue to manage meds for depression and anxiety, talking with MD if need better control of symptoms, work on tobacco cessation again.    LTG  maintainance of depression/anxiety symptoms, tobacco cessation    Continue Psychosocial Services  Follow up required by staff          Psychosocial Re-Evaluation:  Psychosocial Re-Evaluation     Row Name 04/10/24 1406             Psychosocial Re-Evaluation   Current issues with Current Sleep Concerns;Current Anxiety/Panic;Current Depression       Comments Janalyn has returned to rehab after a break. She is still exeperiencing a lot of stress, anxiety and depression. She takes meds and reports it helps a lot but sometimes it can still ve difficult. She is not sleeping. Says she cant lay down flat so she has been sleeping in a recliner, but often cant sleep instead watching TV. She says her Dr gave her some meds to sleep better, she reports it works well but takes some time to begin working. Encuraged her to take the meds well before bed time so it begins working around the time she wants to sleep and to turn off the TV       Expected Outcomes STG: take meds and get good nights sleep. LTG: achieve and maintain a positive outlook  on health and daily life       Interventions Encouraged to attend Cardiac Rehabilitation for the exercise       Continue Psychosocial Services  Follow up required by staff          Psychosocial Discharge (Final Psychosocial Re-Evaluation):  Psychosocial Re-Evaluation - 04/10/24 1406       Psychosocial Re-Evaluation   Current issues with Current Sleep Concerns;Current Anxiety/Panic;Current Depression    Comments Caroleen has returned to rehab after a break. She is still exeperiencing a lot of stress, anxiety and depression. She takes meds and reports it helps a lot but sometimes it can still ve difficult. She is not sleeping. Says she cant lay down flat so she has been sleeping in a recliner, but often cant sleep instead watching TV. She says her Dr gave her some meds to sleep better, she reports it works well but takes some time to begin working. Encuraged her to  take the meds well before bed time so it begins working around the time she wants to sleep and to turn off the TV    Expected Outcomes STG: take meds and get good nights sleep. LTG: achieve and maintain a positive outlook on health and daily life    Interventions Encouraged to attend Cardiac Rehabilitation for the exercise    Continue Psychosocial Services  Follow up required by staff          Vocational Rehabilitation: Provide vocational rehab assistance to qualifying candidates.   Vocational Rehab Evaluation & Intervention:   Education: Education Goals: Education classes will be provided on a variety of topics geared toward better understanding of heart health and risk factor modification. Participant will state understanding/return demonstration of topics presented as noted by education test scores.  Learning Barriers/Preferences:  Learning Barriers/Preferences - 02/07/24 1206       Learning Barriers/Preferences   Learning Barriers Sight    Learning Preferences None          General Cardiac Education  Topics:  AED/CPR: - Group verbal and written instruction with the use of models to demonstrate the basic use of the AED with the basic ABC's of resuscitation.   Anatomy and Cardiac Procedures: - Group verbal and visual presentation and models provide information about basic cardiac anatomy and function. Reviews the testing methods done to diagnose heart disease and the outcomes of the test results. Describes the treatment choices: Medical Management, Angioplasty, or Coronary Bypass Surgery for treating various heart conditions including Myocardial Infarction, Angina, Valve Disease, and Cardiac Arrhythmias.  Written material given at graduation. Flowsheet Row Cardiac Rehab from 05/10/2024 in [facility] Cardiac and Pulmonary Rehab  Date 03/15/24  Educator SB  Instruction Review Code 1- Verbalizes Understanding    Medication Safety: - Group verbal and visual instruction to review commonly prescribed medications for heart and lung disease. Reviews the medication, class of the drug, and side effects. Includes the steps to properly store meds and maintain the prescription regimen.  Written material given at graduation.   Intimacy: - Group verbal instruction through game format to discuss how heart and lung disease can affect sexual intimacy. Written material given at graduation..   Know Your Numbers and Heart Failure: - Group verbal and visual instruction to discuss disease risk factors for cardiac and pulmonary disease and treatment options.  Reviews associated critical values for Overweight/Obesity, Hypertension, Cholesterol, and Diabetes.  Discusses basics of heart failure: signs/symptoms and treatments.  Introduces Heart Failure Zone chart for action plan for heart failure.  Written material given at graduation.   Infection Prevention: - Provides verbal and written material to individual with discussion of infection control including proper hand washing and proper equipment cleaning during exercise  session. Flowsheet Row Cardiac Rehab from 05/10/2024 in [facility] Cardiac and Pulmonary Rehab  Date 02/07/24  Educator [facility]  Instruction Review Code 1- Verbalizes Understanding    Falls Prevention: - Provides verbal and written material to individual with discussion of falls prevention and safety. Flowsheet Row Cardiac Rehab from 05/10/2024 in [facility] Cardiac and Pulmonary Rehab  Date 02/07/24  Educator [facility]  Instruction Review Code 1- Verbalizes Understanding    Other: -Provides group and verbal instruction on various topics (see comments)   Knowledge Questionnaire Score:   Core Components/Risk Factors/Patient Goals at Admission:  Personal Goals and Risk Factors at Admission - 02/07/24 1206       Core Components/Risk Factors/Patient Goals on Admission    Weight Management Yes;Weight Loss  Intervention Weight Management: Develop a combined nutrition and exercise program designed to reach desired caloric intake, while maintaining appropriate intake of nutrient and fiber, sodium and fats, and appropriate energy expenditure required for the weight goal.;Weight Management: Provide education and appropriate resources to help participant work on and attain dietary goals.;Weight Management/Obesity: Establish reasonable short term and long term weight goals.    Expected Outcomes Short Term: Continue to assess and modify interventions until short term weight is achieved;Weight Maintenance: Understanding of the daily nutrition guidelines, which includes 25-35% calories from fat, 7% or less cal from saturated fats, less than 200mg  cholesterol, less than 1.5gm of sodium, & 5 or more servings of fruits and vegetables daily;Weight Loss: Understanding of general recommendations for a balanced deficit meal plan, which promotes 1-2 lb weight loss per week and includes a negative energy balance of [phone] kcal/d;Understanding recommendations for meals to include 15-35% energy as protein, 25-35% energy from fat, 35-60%  energy from carbohydrates, less than 200mg  of dietary cholesterol, 20-35 gm of total fiber daily;Understanding of distribution of calorie intake throughout the day with the consumption of 4-5 meals/snacks    Tobacco Cessation Yes    Number of packs per day 0    Intervention Assist the participant in steps to quit. Provide individualized education and counseling about committing to Tobacco Cessation, relapse prevention, and pharmacological support that can be provided by physician.;Education officer, environmental, assist with locating and accessing local/national Quit Smoking programs, and support quit date choice.    Expected Outcomes Short Term: Will demonstrate readiness to quit, by selecting a quit date.;Long Term: Complete abstinence from all tobacco products for at least 12 months from quit date.;Short Term: Will quit all tobacco product use, adhering to prevention of relapse plan.    Diabetes Yes    Intervention Provide education about signs/symptoms and action to take for hypo/hyperglycemia.;Provide education about proper nutrition, including hydration, and aerobic/resistive exercise prescription along with prescribed medications to achieve blood glucose in normal ranges: Fasting glucose 65-99 mg/dL    Expected Outcomes Short Term: Participant verbalizes understanding of the signs/symptoms and immediate care of hyper/hypoglycemia, proper foot care and importance of medication, aerobic/resistive exercise and nutrition plan for blood glucose control.;Long Term: Attainment of HbA1C < 7%.    Hypertension Yes    Intervention Provide education on lifestyle modifcations including regular physical activity/exercise, weight management, moderate sodium restriction and increased consumption of fresh fruit, vegetables, and low fat dairy, alcohol moderation, and smoking cessation.;Monitor prescription use compliance.    Expected Outcomes Long Term: Maintenance of blood pressure at goal levels.;Short Term:  Continued assessment and intervention until BP is < 140/32mm HG in hypertensive participants. < 130/19mm HG in hypertensive participants with diabetes, heart failure or chronic kidney disease.    Lipids Yes    Intervention Provide education and support for participant on nutrition & aerobic/resistive exercise along with prescribed medications to achieve LDL 70mg , HDL >40mg .    Expected Outcomes Short Term: Participant states understanding of desired cholesterol values and is compliant with medications prescribed. Participant is following exercise prescription and nutrition guidelines.;Long Term: Cholesterol controlled with medications as prescribed, with individualized exercise RX and with personalized nutrition plan. Value goals: LDL < 70mg , HDL > 40 mg.          Education:Diabetes - Individual verbal and written instruction to review signs/symptoms of diabetes, desired ranges of glucose level fasting, after meals and with exercise. Acknowledge that pre and post exercise glucose checks will be done for 3 sessions at entry of  program. Flowsheet Row Cardiac Rehab from 05/10/2024 in [facility] Cardiac and Pulmonary Rehab  Date 02/07/24  Educator [facility]  Instruction Review Code 1- Verbalizes Understanding    Core Components/Risk Factors/Patient Goals Review:   Goals and Risk Factor Review     Row Name 04/10/24 1411 04/19/24 1500           Core Components/Risk Factors/Patient Goals Review   Personal Goals Review Hypertension Weight Management/Obesity;Hypertension;Lipids;Diabetes      Review Maciel reports she checks her Blood pressure at home. Encouraged her to continue to monitor her blood pressure and report any changes to rehab team or Dr Barnie has lost a few pounds since starting the program 235 to 232 today, her BP is is good range 116-128/58-64 after cooldown, her lipids recent draw LDL is 48.  She continues to take her meds and attend the exercise ssessions.  She is not walking around outside  because of weather. she does walk around her house when doing chores.  Her last HbA1C was 6.9%, up some from last 2 . Sh eis concentrating on cutting back on the number of peppermints she eats everyday.  She is compliaant with her meds and has reduced her sodium intake.      Expected Outcomes STG: check BP at home. LTG: manage risk factors independently STG continue with cutting back on daily candy consumption, maintian compliance with all meds. continue sodium restriction and weight loss steps  LTG  better control of intake of sweets and sodium, continue to maintain med compiance and exercise progression         Core Components/Risk Factors/Patient Goals at Discharge (Final Review):   Goals and Risk Factor Review - 04/19/24 1500       Core Components/Risk Factors/Patient Goals Review   Personal Goals Review Weight Management/Obesity;Hypertension;Lipids;Diabetes    Review Marice has lost a few pounds since starting the program 235 to 232 today, her BP is is good range 116-128/58-64 after cooldown, her lipids recent draw LDL is 48.  She continues to take her meds and attend the exercise ssessions.  She is not walking around outside because of weather. she does walk around her house when doing chores.  Her last HbA1C was 6.9%, up some from last 2 . Sh eis concentrating on cutting back on the number of peppermints she eats everyday.  She is compliaant with her meds and has reduced her sodium intake.    Expected Outcomes STG continue with cutting back on daily candy consumption, maintian compliance with all meds. continue sodium restriction and weight loss steps  LTG  better control of intake of sweets and sodium, continue to maintain med compiance and exercise progression          ITP Comments:  ITP Comments     Row Name 02/07/24 1205 02/21/24 1553 03/15/24 0945 04/12/24 1037 05/10/24 1038   ITP Comments Virtual Visit completed. Patient informed on EP and RD appointment and 6 Minute walk test.  Patient also informed of patient health questionnaires on My Chart. Patient Verbalizes understanding. Visit diagnosis can be found in [facility] 12/21/2023. Completed and gym orientation. Initial ITP created and sent for review to Dr. Oneil Pinal, Medical Director. 30 Day review completed. Medical Director ITP review done, changes made as directed, and signed approval by Medical Director.   new to program 30 Day review completed. Medical Director ITP review done, changes made as directed, and signed approval by Medical Director. 30 Day review completed. Medical Director ITP review done, changes  made as directed, and signed approval by Medical Director.    Row Name 05/29/24 1402           ITP Comments Trang graduated today from  rehab with 36 sessions completed.  Details of the patient's exercise prescription and what She needs to do in order to continue the prescription and progress were discussed with patient.  Patient was given a copy of prescription and goals.  Patient verbalized understanding. Brisha plans to continue to exercise by walking.          Comments: discharge ITP

## 2024-05-29 NOTE — Progress Notes (Signed)
 Discharge Summary   Amanda [name] DOB: 03-Sep-2051   Amanda [name] graduated today from  rehab with 36 sessions completed.  Details of the patient's exercise prescription and what Amanda [name] needs to do in order to continue the prescription and progress were discussed with patient.  Patient was given a copy of prescription and goals.  Patient verbalized understanding. Amanda [name] plans to continue to exercise by walking.   6 Minute Walk     Row Name 02/21/24 1557 05/18/24 1403       6 Minute Walk   Phase Initial Discharge    Distance 580 feet 970 feet    Distance % Change -- 67 %    Distance Feet Change -- 390 ft    Walk Time 6 minutes 6 minutes    # of Rest Breaks 0 0    MPH 1.1 1.8    METS 1 1.77    RPE 11 12    Perceived Dyspnea  1 1    VO2 Peak 3.21 6.19    Symptoms Yes (comment) Yes (comment)    Comments leg fatigue, used walker leg fatigue, used walker    Resting HR 65 bpm 76 bpm    Resting BP 102/56 118/68    Resting Oxygen Saturation  96 % 96 %    Exercise Oxygen Saturation  during 6 min walk 94 % 95 %    Max Ex. HR 96 bpm 101 bpm    Max Ex. BP 138/70 156/58    2 Minute Post BP 134/68 --

## 2024-05-29 NOTE — Progress Notes (Signed)
 Daily Session Note  Patient Details  Name: Amanda [name] MRN: [id number] Date of Birth: March 03, 1951 Referring Provider:   Flowsheet Row Cardiac Rehab from 02/21/2024 in [facility]  Referring Provider Dr. Deatrice Cage, MD    Encounter Date: 05/29/2024  Check In:  Session Check In - 05/29/24 1357       Check-In   Supervising physician immediately available to respond to emergencies See telemetry face sheet for immediately available ER MD    Location [facility]-Cardiac & Pulmonary Rehab    Staff Present Hoy Rodney RN,BSN;Maxon Burnell BS, Exercise Physiologist;Kelly Dyane HECKLE, ACSM CEP, Exercise Physiologist;Noah Tickle, BS, Exercise Physiologist;[facility]    Virtual Visit No    Medication changes reported     No    Fall or balance concerns reported    No    Warm-up and Cool-down Performed on first and last piece of equipment    Resistance Training Performed Yes    VAD Patient? No    PAD/SET Patient? No      Pain Assessment   Currently in Pain? No/denies             Social History   Tobacco Use  Smoking Status Former   Average packs/day: 0.8 packs/day for 45.0 years (33.8 ttl pk-yrs)   Types: Cigarettes   Start date: 01/2023  Smokeless Tobacco Never  Tobacco Comments   Does not on starting back smoking.    Goals Met:  Independence with exercise equipment Exercise tolerated well No report of concerns or symptoms today Strength training completed today  Goals Unmet:  Not Applicable  Comments:  Kalese graduated today from  rehab with 36 sessions completed.  Details of the patient's exercise prescription and what She needs to do in order to continue the prescription and progress were discussed with patient.  Patient was given a copy of prescription and goals.  Patient verbalized understanding. Teralyn plans to continue to exercise by walking.     Dr. Oneil Pinal is Medical Director for [facility] Cardiac Rehabilitation.   Dr. Fuad Aleskerov is Medical Director for [facility] Pulmonary Rehabilitation.

## 2024-05-30 ENCOUNTER — Ambulatory Visit: Admitting: Internal Medicine

## 2024-05-30 DIAGNOSIS — J449 Chronic obstructive pulmonary disease, unspecified: Secondary | ICD-10-CM

## 2024-05-30 DIAGNOSIS — F17200 Nicotine dependence, unspecified, uncomplicated: Secondary | ICD-10-CM | POA: Diagnosis not present

## 2024-05-30 LAB — PULMONARY FUNCTION TEST
DL/VA % pred: 94 %
DL/VA: 3.86 ml/min/mmHg/L
DLCO unc % pred: 83 %
DLCO unc: 17.27 ml/min/mmHg
FEF 25-75 Post: 2.34 L/s
FEF 25-75 Pre: 1.36 L/s
FEF2575-%Change-Post: 72 %
FEF2575-%Pred-Post: 126 %
FEF2575-%Pred-Pre: 73 %
FEV1-%Change-Post: 15 %
FEV1-%Pred-Post: 90 %
FEV1-%Pred-Pre: 78 %
FEV1-Post: 2.16 L
FEV1-Pre: 1.86 L
FEV1FVC-%Change-Post: 1 %
FEV1FVC-%Pred-Pre: 98 %
FEV6-%Change-Post: 14 %
FEV6-%Pred-Post: 94 %
FEV6-%Pred-Pre: 82 %
FEV6-Post: 2.84 L
FEV6-Pre: 2.49 L
FEV6FVC-%Change-Post: 0 %
FEV6FVC-%Pred-Post: 104 %
FEV6FVC-%Pred-Pre: 104 %
FVC-%Change-Post: 13 %
FVC-%Pred-Post: 90 %
FVC-%Pred-Pre: 79 %
FVC-Post: 2.86 L
FVC-Pre: 2.51 L
Post FEV1/FVC ratio: 75 %
Post FEV6/FVC ratio: 99 %
Pre FEV1/FVC ratio: 74 %
Pre FEV6/FVC Ratio: 99 %
RV % pred: 110 %
RV: 2.63 L
TLC % pred: 93 %
TLC: 5.1 L

## 2024-05-30 NOTE — Patient Instructions (Signed)
 Full PFT completed today ? ?

## 2024-05-30 NOTE — Progress Notes (Signed)
 Full PFT completed today. Pt met ATS except for DLCO. After DLCO, pt became very dizzy and needed to take multiple deep breaths.

## 2024-05-31 ENCOUNTER — Encounter

## 2024-05-31 DIAGNOSIS — R2689 Other abnormalities of gait and mobility: Secondary | ICD-10-CM | POA: Diagnosis not present

## 2024-05-31 DIAGNOSIS — G629 Polyneuropathy, unspecified: Secondary | ICD-10-CM | POA: Diagnosis not present

## 2024-05-31 DIAGNOSIS — Z1331 Encounter for screening for depression: Secondary | ICD-10-CM | POA: Diagnosis not present

## 2024-05-31 DIAGNOSIS — R42 Dizziness and giddiness: Secondary | ICD-10-CM | POA: Diagnosis not present

## 2024-05-31 DIAGNOSIS — R29898 Other symptoms and signs involving the musculoskeletal system: Secondary | ICD-10-CM | POA: Diagnosis not present

## 2024-06-01 ENCOUNTER — Encounter

## 2024-06-05 ENCOUNTER — Encounter

## 2024-06-07 ENCOUNTER — Encounter

## 2024-06-08 ENCOUNTER — Encounter

## 2024-06-16 ENCOUNTER — Encounter: Payer: Self-pay | Admitting: *Deleted

## 2024-06-28 DIAGNOSIS — G4730 Sleep apnea, unspecified: Secondary | ICD-10-CM | POA: Diagnosis not present

## 2024-06-28 DIAGNOSIS — R0902 Hypoxemia: Secondary | ICD-10-CM | POA: Diagnosis not present

## 2024-06-30 DIAGNOSIS — G4734 Idiopathic sleep related nonobstructive alveolar hypoventilation: Secondary | ICD-10-CM

## 2024-07-03 NOTE — Addendum Note (Signed)
 Addended by: Sabena Winner J on: 07/03/2024 08:39 AM   Modules accepted: Orders

## 2024-07-04 NOTE — Progress Notes (Unsigned)
 Cardiology Office Note    Date:  07/05/2024   ID:  MONTEEN TOOPS, DOB [birth year], MRN [id number]  PCP:  Watt Mirza, MD  Cardiologist:  Deatrice Cage, MD  Electrophysiologist:  None   Chief Complaint: Follow up  History of Present Illness:   Amanda [name] is a 73 y.o. female with history of nonobstructive CAD by LHC most recently in 09/2023, moderate to severe aortic valve stenosis status post TAVR in 12/2023, diet-controlled diabetes, COPD, HTN, HLD, ongoing tobacco use, mild positional OSA, and mild cognitive impairment followed by neurology who presents for follow-up of CAD.  Coronary CTA in 08/2018 showed a calcium  score of 521 with moderate nonobstructive disease involving the LAD and RCA that were not significant by ctFFR.  Echo in 08/2019 demonstrated an EF of 60 to 65% with moderate aortic stenosis with a mean gradient of 19 mmHg and a valve area of 1.28 cm.  Repeat echo in 11/2020 showed an EF of 60 to 65%, grade 1 diastolic dysfunction, and moderate aortic stenosis with a mean gradient of 22 mmHg and a valve area of 1.29 cm.  She was admitted to the hospital in 11/2020 with chest pain and worsening exertional dyspnea.  R/LHC showed mild nonobstructive CAD with RHC showing normal filling pressures, normal pulmonary pressure, and normal cardiac output.  Aortic stenosis was moderate with a mean gradient of 24 mmHg and a valve area of 1.44 cm.  Carotid artery ultrasound in 06/2021 showed 1 to 39% bilateral ICA stenoses with antegrade flow of the bilateral vertebral arteries and normal flow hemodynamics of the bilateral subclavian arteries.  She fell in 07/2021 leading to 2 broken ribs.  She was admitted in 11/2021 with right hand cellulitis/abscess due to an infected cat bite.  Echo during that admission demonstrated an EF of 65 to 70%, no regional wall motion abnormalities, mild LVH, normal LV diastolic function parameters, normal RV systolic function and ventricular cavity size,  mild to moderate aortic valve insufficiency, and moderate aortic valve stenosis with a mean gradient of 29.2 mmHg.  She was seen in 07/2022 and forgot to mention at her prior visit that she had 2 episodes of chest discomfort that improved with husband's SL NTG.  She also noted some lower extremity heaviness with ambulation.  Lexiscan  MPI on 07/16/2022 showed no evidence of ischemia or infarction with normal LVEF and was overall low risk.  ABI normal bilaterally in 08/2022.  Echo in 11/2022 showed an EF of 65 to 70%, no regional wall motion abnormalities, mild LVH, grade 1 diastolic dysfunction mild aortic insufficiency, moderate aortic stenosis with a mean gradient of 30 mmHg and a valve area of 1.53 cm, and an estimated right atrial pressure of 8 mmHg.   She underwent MRI of the brain in 01/2023 for evaluation of memory loss, ataxia falls, and gait disturbance.  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  She subsequently followed up with ENT and reported waxing and waning swelling in this area.  Follow-up ultrasound showed a 1.1 cm simple appearing cyst within the right parotid gland.   She was seen in the office in 08/2023 and reported prior dizziness and lightheadedness improved with gait stability physical therapy.  To follow-up on her aortic valve  stenosis/insufficiency she underwent echo on 09/20/2023, that showed an EF of 60 to 65%, no regional wall motion abnormalities, mild LVH, grade 1 diastolic dysfunction, normal RV systolic function and ventricular cavity size, mild mitral regurgitation, severe aortic stenosis with a mean gradient of 36 mmHg and a valve area of 0.7 cm, mild aortic insufficiency, and an estimated right atrial pressure of 8 mmHg.  When  compared to echo from 11/2022, her aortic stenosis has progressed with a prior mean gradient of 30 mmHg and a valve area of 1.53 cm.  She was seen in follow-up on 09/22/2023 and reported some increased shortness of breath with exertion that improved with rest.  Subsequent R/LHC on 10/04/2023 showed mild nonobstructive disease, moderate to severe aortic stenosis with a mean gradient of 32 mmHg and a valve area 1.1 cm, normal right atrial pressure, mildly elevated wedge pressure, minimal pulmonary hypertension, and normal cardiac output.  TAVR evaluation was recommended.   She was evaluated by the structural heart team in 10/2023 and underwent TAVR on 12/21/2023.  Postoperative echo showed preserved LV systolic function with normal functioning TAVR.  Subsequent Zio patch did not show any evidence of high-grade AV block.  PYP scan negative.  Most recent echo from 01/2024 showed an EF of 65 to 70%, no regional wall motion abnormalities, mild concentric LVH, grade 1 diastolic dysfunction, normal RV systolic function and ventricular cavity size, mildly dilated left atrium, trivial mitral regurgitation, and normal structure and function of TAVR with a mean gradient of 6 mmHg.   She was admitted to the hospital on 03/29/2024 through 03/31/2024 with acute hypoxic respiratory failure secondary to COPD exacerbation and diastolic heart failure with admission also notable for demand ischemia in the setting of hypoxia upon presentation.  BNP 39.  She had symptomatic improvement with IV diuresis, nebulizers, and steroids.  She was discharged home on furosemide  40 mg daily.    She was last seen in the office in 04/2024 and reported lightheadedness and dizziness with positional changes and increased fatigue.  Orthostatics were positive in the office.  Furosemide  and spironolactone  were held at that time.  Echo in 04/2024 showed an EF of 60 to 65%, no regional wall motion abnormalities, mild LVH, grade 1 diastolic dysfunction,  normal RV systolic function and ventricular cavity size, and normal structure and function of aortic valve prosthesis.  She comes in doing reasonably well from a cardiac perspective and is without symptoms of angina or cardiac decompensation.  Dyspnea is improved when compared to last visit, though more noticeable in the excessive heat.  Her son is encouraging her to get out and walk some laps around the local mall.  No significant lower extremity swelling or progressive orthopnea.  Weight is up 5 pounds today when compared to her visit in 04/2024, though she was volume depleted at that time.  Orthostasis has improved.  She does continue to have some underlying fatigue.  No progressive orthopnea or early satiety.  No frank syncope.  No symptoms concerning for bleeding.   Labs independently reviewed: 04/2024 - BUN 13, serum creatinine 0.95, potassium 4.9, Hgb 13.5, PLT 194 03/2024 - TC 141, TG 211, HDL 60, LDL 48, magnesium  2.4, A1c 6.9, LP(a) less than 8.4, albumin 4.1, AST/ALT normal 01/2023 - A1c 6.4, TSH normal   Past Medical History:  Diagnosis Date   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 sting    Aortic stenosis    a. 2013: nl LV sys fxn, mild MR, no evidence of pulm htn; b. TTE  8/17: EF 60-65%, no RWMA, nl LV dia fxn,  mild AS, mod AI   CAD (coronary artery disease), native coronary artery 11/26/2020   Controlled type 2 diabetes mellitus with diabetic autonomic neuropathy, without long-term current use of insulin  (HCC) 08/10/2007   COPD (chronic obstructive pulmonary disease) (HCC) 10/30/2016   Depression    GAD (generalized anxiety disorder)    GERD (gastroesophageal reflux disease)    Hiatal hernia with gastroesophageal reflux 1997   Hyperlipidemia    Hypertension    Osteoporosis    S/P TAVR (transcatheter aortic valve replacement) 12/21/2023   s/p TAVR with a 34 mm Evolut FX via the TF approach by Dr. Wendel & Dr. Maryjane   Tobacco abuse     Past Surgical History:  Procedure Laterality Date    ABDOMINAL HYSTERECTOMY  1993   ANTERIOR CERVICAL DECOMP/DISCECTOMY FUSION N/A 10/26/2018   Procedure: ACDF C4-C5 C5-C6 C6-C7;  Surgeon: Onetha Kuba, MD;  Location: [facility] OR;  Service: Neurosurgery;  Laterality: N/A;   APPENDECTOMY     BACK SURGERY     BREAST BIOPSY Right 2007   benign   CHOLECYSTECTOMY     COLONOSCOPY     30 years ago was normal per pt.    DILATION AND CURETTAGE OF UTERUS     ESOPHAGEAL DILATION     x 3   ESOPHAGOGASTRODUODENOSCOPY (EGD) WITH PROPOFOL  N/A 02/06/2022   Procedure: ESOPHAGOGASTRODUODENOSCOPY (EGD) WITH PROPOFOL ;  Surgeon: Therisa Bi, MD;  Location: [facility] ENDOSCOPY;  Service: Gastroenterology;  Laterality: N/A;   ESOPHAGOGASTRODUODENOSCOPY (EGD) WITH PROPOFOL  N/A 02/26/2022   Procedure: ESOPHAGOGASTRODUODENOSCOPY (EGD) WITH PROPOFOL ;  Surgeon: Therisa Bi, MD;  Location: [facility] ENDOSCOPY;  Service: Gastroenterology;  Laterality: N/A;  EGD+dilation per Dr. Therisa   fractured leg Right    INCISION AND DRAINAGE Right 12/02/2021   Procedure: INCISION AND DRAINAGE;  Surgeon: Kathlynn Sharper, MD;  Location: [facility] ORS;  Service: Orthopedics;  Laterality: Right;   INTRAOPERATIVE TRANSTHORACIC ECHOCARDIOGRAM N/A 12/21/2023   Procedure: INTRAOPERATIVE TRANSTHORACIC ECHOCARDIOGRAM;  Surgeon: Wendel Lurena POUR, MD;  Location: [facility] INVASIVE CV LAB;  Service: Cardiovascular;  Laterality: N/A;   LEFT HEART CATH AND CORONARY ANGIOGRAPHY N/A 11/11/2020   Procedure: LEFT HEART CATH AND CORONARY ANGIOGRAPHY;  Surgeon: Darron Deatrice LABOR, MD;  Location: [facility] INVASIVE CV LAB;  Service: Cardiovascular;  Laterality: N/A;   OOPHORECTOMY     OVARIAN CYST REMOVAL  1972   RIGHT/LEFT HEART CATH AND CORONARY ANGIOGRAPHY Bilateral 10/04/2023   Procedure: RIGHT/LEFT HEART CATH AND CORONARY ANGIOGRAPHY;  Surgeon: Darron Deatrice LABOR, MD;  Location: [facility] INVASIVE CV LAB;  Service: Cardiovascular;  Laterality: Bilateral;   SALIVARY GLAND SURGERY     TONSILLECTOMY     4 yoa   TOTAL KNEE ARTHROPLASTY Left  12/24/2015   Procedure: LEFT TOTAL KNEE ARTHROPLASTY;  Surgeon: Glendia Cordella Hutchinson, MD;  Location: [facility] OR;  Service: Orthopedics;  Laterality: Left;   TOTAL KNEE ARTHROPLASTY Right 12/30/2017   Procedure: RIGHT TOTAL KNEE ARTHROPLASTY;  Surgeon: Hutchinson Cordella Glendia, MD;  Location: [facility] OR;  Service: Orthopedics;  Laterality: Right;   WRIST FRACTURE SURGERY  11/2008    Current Medications: Current Meds  Medication Sig   acetaminophen  (TYLENOL ) 500 MG tablet Take 1,000 mg by mouth every 8 (eight) hours as needed for mild pain (pain score 1-3) or moderate pain (pain score 4-6).   albuterol  (VENTOLIN  HFA) 108 (90 Base) MCG/ACT inhaler Inhale 2 puffs into the lungs every 4 (four) hours as needed for wheezing or shortness of breath.  amitriptyline  (ELAVIL ) 50 MG tablet TAKE 1/2 (ONE-HALF) TABLET BY MOUTH AT BEDTIME   Ascorbic Acid  (VITAMIN C ) 1000 MG tablet Take 1,000 mg by mouth daily.   aspirin  EC 81 MG tablet Take 81 mg by mouth daily.   budesonide  (PULMICORT ) 0.5 MG/2ML nebulizer solution Take 2 mLs (0.5 mg total) by nebulization 2 (two) times daily.   buPROPion  (WELLBUTRIN  XL) 150 MG 24 hr tablet Take 1 tablet by mouth once daily   calcium  carbonate (OS-CAL - DOSED IN MG OF ELEMENTAL CALCIUM ) 1250 (500 Ca) MG tablet Take 1 tablet by mouth daily at 6 (six) AM.   calcium  carbonate (TUMS - DOSED IN MG ELEMENTAL CALCIUM ) 500 MG chewable tablet Chew 2 tablets by mouth daily as needed for indigestion or heartburn.   Cholecalciferol  (VITAMIN D ) 1000 UNITS capsule Take 2,000 Units by mouth daily.   clonazePAM  (KLONOPIN ) 0.5 MG tablet Take 1 tablet by mouth twice daily as needed   cyanocobalamin  (VITAMIN B12) 1000 MCG/ML injection Inject into the muscle.   cyclobenzaprine  (FLEXERIL ) 10 MG tablet Take 1 tablet by mouth three times daily as needed for muscle spasm   dimenhyDRINATE (DRAMAMINE) 50 MG tablet Take 50 mg by mouth every 8 (eight) hours as needed for nausea or dizziness.   donepezil  (ARICEPT ) 5 MG  tablet TAKE 1 TABLET BY MOUTH AT BEDTIME   ezetimibe  (ZETIA ) 10 MG tablet Take 1 tablet by mouth once daily   FLUoxetine  (PROZAC ) 40 MG capsule Take 80 mg by mouth M, W, F and 40 mg by mouth Tues, Thurs, Sat and Sunday   fluticasone  (FLONASE) 50 MCG/ACT nasal spray Place 2 sprays into both nostrils daily as needed for rhinitis.   gabapentin  (NEURONTIN ) 100 MG capsule Take 100 mg by mouth.   guaiFENesin  (MUCINEX ) 600 MG 12 hr tablet Take 600 mg by mouth 2 (two) times daily as needed for to loosen phlegm.   ipratropium-albuterol  (DUONEB) 0.5-2.5 (3) MG/3ML SOLN Take 3 mLs by nebulization every 6 (six) hours as needed.   lisinopril  (ZESTRIL ) 5 MG tablet Take 5 mg by mouth daily.   magnesium  oxide (MAG-OX) 400 (240 Mg) MG tablet Take 400 mg by mouth daily.   metoprolol  tartrate (LOPRESSOR ) 25 MG tablet Take 1/2 (one-half) tablet by mouth twice daily   Multiple Vitamin (MULTIVITAMIN) tablet Take 1 tablet by mouth daily.   naproxen  sodium (ALEVE ) 220 MG tablet Take 220 mg by mouth in the morning.   pantoprazole  (PROTONIX ) 40 MG tablet TAKE 1 TABLET BY MOUTH ONCE DAILY 30  MINUTES  PRIOR  TO  BREAKFAST   rosuvastatin  (CRESTOR ) 40 MG tablet Take 1 tablet by mouth once daily   Simethicone  125 MG CAPS Take 125 mg by mouth daily as needed (gas).   vitamin E 180 MG (400 UNITS) capsule Take 400 Units by mouth daily.    Allergies:   Honey bee venom [bee venom] and Diazepam   Social History   Socioeconomic History   Marital status: Married    Spouse name: Not on file   Number of children: 2   Years of education: Not on file   Highest education level: Some college, no degree  Occupational History   Occupation: Insurance claims handler: [employer]  Tobacco Use   Smoking status: Former    Average packs/day: 0.8 packs/day for 45.0 years (33.8 ttl pk-yrs)    Types: Cigarettes    Start date: 01/2023   Smokeless tobacco: Never   Tobacco comments:  Does not on  starting back smoking.  Vaping Use   Vaping status: Never Used  Substance and Sexual Activity   Alcohol use: Yes    Alcohol/week: 0.0 standard drinks of alcohol    Comment: glass of wine twice a month    Drug use: No   Sexual activity: Not Currently  Other Topics Concern   Not on file  Social History Narrative   Not on file   Social Drivers of Health   Financial Resource Strain: High Risk (02/21/2024)   Overall Financial Resource Strain (CARDIA)    Difficulty of Paying Living Expenses: Hard  Food Insecurity: No Food Insecurity (03/29/2024)   Hunger Vital Sign    Worried About Running Out of Food in the Last Year: Never true    Ran Out of Food in the Last Year: Never true  Recent Concern: Food Insecurity - Food Insecurity Present (02/21/2024)   Hunger Vital Sign    Worried About Running Out of Food in the Last Year: Sometimes true    Ran Out of Food in the Last Year: Never true  Transportation Needs: No Transportation Needs (03/29/2024)   PRAPARE - Administrator, Civil Service (Medical): No    Lack of Transportation (Non-Medical): No  Physical Activity: Unknown (02/21/2024)   Exercise Vital Sign    Days of Exercise per Week: 0 days    Minutes of Exercise per Session: Not on file  Stress: Stress Concern Present (02/21/2024)   [employer] of Occupational Health - Occupational Stress Questionnaire    Feeling of Stress : Very much  Social Connections: Moderately Isolated (03/29/2024)   Social Connection and Isolation Panel    Frequency of Communication with Friends and Family: Twice a week    Frequency of Social Gatherings with Friends and Family: Once a week    Attends Religious Services: 1 to 4 times per year    Active Member of [employer] or Organizations: No    Attends Banker Meetings: Patient declined    Marital Status: Widowed     Family History:  The patient's family history includes Arthritis in her mother; Breast cancer in her sister; Cancer in  her paternal grandmother; Colon polyps in her mother and sister; Diabetes in her mother; Heart disease in her mother; Hypercholesterolemia in her sister; Hyperlipidemia in her mother. There is no history of Colon cancer, Esophageal cancer, Rectal cancer, Stomach cancer, or Pancreatic cancer.  ROS:   12-point review of systems is negative unless otherwise noted in the HPI.   EKGs/Labs/Other Studies Reviewed:    Studies reviewed were summarized above. The additional studies were reviewed today:  2D echo 05/02/2024: 1. Left ventricular ejection fraction, by estimation, is 60 to 65%. Left  ventricular ejection fraction by 2D MOD biplane is 60.6 %. The left  ventricle has normal function. The left ventricle has no regional wall  motion abnormalities. There is mild left  ventricular hypertrophy. Left ventricular diastolic parameters are  consistent with Grade I diastolic dysfunction (impaired relaxation).   2. Right ventricular systolic function is normal. The right ventricular  size is normal.   3. The mitral valve is normal in structure. No evidence of mitral valve  regurgitation.   4. The aortic valve has been repaired/replaced. Aortic valve  regurgitation is not visualized. Aortic valve mean gradient measures 6.3  mmHg.  __________  2D echo 01/21/2024: 1. Left ventricular ejection fraction, by estimation, is 65 to 70%. Left  ventricular ejection fraction by 3D volume  is 60 %. The left ventricle has  normal function. The left ventricle has no regional wall motion  abnormalities. There is mild concentric  left ventricular hypertrophy. Left ventricular diastolic parameters are  consistent with Grade I diastolic dysfunction (impaired relaxation). The  average left ventricular global longitudinal strain is -19.2 %. The global  longitudinal strain is normal.   2. Right ventricular systolic function is normal. The right ventricular  size is normal.   3. Left atrial size was mildly dilated.    4. The mitral valve is normal in structure. Trivial mitral valve  regurgitation. No evidence of mitral stenosis.   5. The aortic valve is normal in structure. Aortic valve regurgitation is  not visualized. No aortic stenosis is present. There is a 34 Medtronic  CoreValve-Evolut Pro prosthetic (TAVR) valve present in the aortic  position. Procedure Date: 12/21/2023.  Aortic valve area, by VTI measures 3.19 cm. Aortic valve mean gradient  measures 6.0 mmHg. Aortic valve Vmax measures 1.74 m/s.   6. The inferior vena cava is normal in size with greater than 50%  respiratory variability, suggesting right atrial pressure of 3 mmHg.  __________   Zio patch 12/2023: Patient had a min HR of 56 bpm, max HR of 129 bpm, and avg HR of 72 bpm. Predominant underlying rhythm was Sinus Rhythm. Bundle Branch Block/IVCD was present. 1 run of Supraventricular Tachycardia occurred lasting 4 beats with a max rate of 129 bpm (avg  107 bpm).  Rare PACs and rare PVCs. No significant bradycardia overall. __________   Myocardial PYP scan 01/14/2024:   Myocardial uptake was negative for radiotracer uptake. The visual grade of myocardial uptake relative to the ribs was Grade 0 (No myocardial uptake and normal bone uptake).   Findings are not suggestive (Grade 0) of cardiac ATTR amyloidosis.   Prior study not available for comparison. __________   2D echo 12/22/2023: 1. Left ventricular ejection fraction, by estimation, is 70 to 75%. The  left ventricle has hyperdynamic function. The left ventricle has no  regional wall motion abnormalities. There is mild concentric left  ventricular hypertrophy. Left ventricular  diastolic parameters are consistent with Grade I diastolic dysfunction  (impaired relaxation).   2. Right ventricular systolic function is normal. The right ventricular  size is normal. Tricuspid regurgitation signal is inadequate for assessing  PA pressure.   3. The mitral valve is degenerative.  Trivial mitral valve regurgitation.  Moderate mitral annular calcification.   4. The aortic valve has been repaired/replaced. Aortic valve  regurgitation is not visualized. There is a 34mm Medtronic  CoreValve-Evolut Pro prosthetic (TAVR) valve present in the aortic  position. Procedure Date: 12/21/23. Echo findings are consistent  with normal structure and function of the aortic valve prosthesis. Aortic  valve area, by VTI measures 3.10 cm. Aortic valve mean gradient measures  8.0 mmHg. Aortic valve Vmax measures 1.89 m/s.   5. The inferior vena cava is normal in size with greater than 50%  respiratory variability, suggesting right atrial pressure of 3 mmHg.  __________   Limited echo 12/21/2023: 1. Left ventricular ejection fraction, by estimation, is 60 to 65%. The  left ventricle has normal function. The left ventricle has no regional  wall motion abnormalities. Left ventricular diastolic parameters are  consistent with Grade I diastolic  dysfunction (impaired relaxation).   2. Right ventricular systolic function is normal. The right ventricular  size is normal.   3. The mitral valve is degenerative. No evidence of mitral valve  regurgitation.  Moderate mitral annular calcification.   4. TAVR Pre procedure: mean gradient 30 mmHg. Peak gradient 45 mm Hg. DVI  .34. AVA 1.44cm2. Mild to moderate central AI      Post deployment: mean gradient 2 mmHg. Peak gradient 4 mm Hg DVI 0.65.  EOA 3.12cm2 with trivial PVL. Aortic valve regurgitation is mild to  moderate. There is a 34 mm Medtronic stented (TAVR) valve present in the  aortic position.   Comparison(s): Successful TAVR Placement.  __________   Carotid artery ultrasound 10/28/2023: Summary:  Right Carotid: Velocities in the right ICA are consistent with a 1-39% stenosis.   Left Carotid: Velocities in the left ICA are consistent with a 1-39% stenosis.   Vertebrals: Bilateral vertebral arteries demonstrate antegrade flow.   Subclavians: Right subclavian artery flow was disturbed. Normal flow hemodynamics were seen in the left subclavian artery.  __________  [facility] 10/04/2023: 1.  Mild nonobstructive coronary artery disease. 2.  Moderate to severe aortic stenosis with mean gradient of 32 mmHg and valve area of 1.1 cm. 3.  Right heart catheterization showed normal RA pressure, mildly elevated wedge pressure, minimal pulmonary hypertension and normal cardiac output.   Recommendations: Proceed with TAVR evaluation. __________   2D echo 09/20/2023: 1. Left ventricular ejection fraction, by estimation, is 60 to 65%. The  left ventricle has normal function. The left ventricle has no regional  wall motion abnormalities. There is mild left ventricular hypertrophy.  Left ventricular diastolic parameters  are consistent with Grade I diastolic dysfunction (impaired relaxation).   2. Right ventricular systolic function is normal. The right ventricular  size is normal.   3. The mitral valve is degenerative. Mild mitral valve regurgitation.   4. AV mean gradient , PG , DVI 0.24, AVA 0.7cm2. The aortic  valve is calcified. Aortic valve regurgitation is mild. Severe aortic  valve stenosis.   5. The inferior vena cava is dilated in size with >50% respiratory  variability, suggesting right atrial pressure of 8 mmHg.  __________   2D echo 11/11/2022: 1. Left ventricular ejection fraction, by estimation, is 65 to 70%. The  left ventricle has normal function. The left ventricle has no regional  wall motion abnormalities. There is mild left ventricular hypertrophy.  Left ventricular diastolic parameters  are consistent with Grade I diastolic dysfunction (impaired relaxation).   2. Right ventricular systolic function is normal. The right ventricular  size is normal.   3. The mitral valve is degenerative. No evidence of mitral valve  regurgitation.   4. Mean aortic valve gradient , peak gradient ,  Vmax 3.62m/s,  DVI 0.37.SABRA The aortic valve is calcified. Aortic valve regurgitation is  mild. Moderate aortic valve stenosis. Aortic valve area, by VTI measures  1.53 cm.   5. The inferior vena cava is dilated in size with >50% respiratory  variability, suggesting right atrial pressure of 8 mmHg.  ___________   2D echo 12/02/2021: 1. Left ventricular ejection fraction, by estimation, is 65 to 70%. The  left ventricle has normal function. The left ventricle has no regional  wall motion abnormalities. There is mild left ventricular hypertrophy.  Left ventricular diastolic parameters  were normal.   2. Right ventricular systolic function is normal. The right ventricular  size is normal.   3. The mitral valve is degenerative. No evidence of mitral valve  regurgitation.   4. The aortic valve is calcified. Aortic valve regurgitation is mild to  moderate. Moderate aortic valve stenosis. Aortic valve mean gradient  measures 29.2 mmHg.  Aortic valve Vmax measures 3.35 m/s.   5. The inferior vena cava is normal in size with greater than 50%  respiratory variability, suggesting right atrial pressure of 3 mmHg __________   Carotid artery ultrasound 07/04/2021: Summary:  Right Carotid: Velocities in the right ICA are consistent with a 1-39%  stenosis. Non-hemodynamically significant plaque <50% noted in the  CCA. The ECA appears <50% stenosed.   Left Carotid: Velocities in the left ICA are consistent with a 1-39%  stenosis. Non-hemodynamically significant plaque <50% noted in the CCA. The ECA appears <50% stenosed.   Vertebrals:  Bilateral vertebral arteries demonstrate antegrade flow.  Subclavians: Normal flow hemodynamics were seen in bilateral subclavian arteries. __________   LHC 11/11/2020: 1.  Mild nonobstructive coronary artery disease.  Tortuous vessels overall suggestive of chronic hypertension. 2.  Left ventricular angiography was not performed.  EF was normal by echo. 3.  Right  heart catheterization showed normal filling pressures, normal pulmonary pressure and normal cardiac output. 4.  Moderate aortic stenosis with mean gradient of 24 mmHg and valve area of 1.44 cm.   Recommendations: No culprit is identified for the patient's chest pain.  Recommend medical therapy for nonobstructive coronary artery disease. Continue to monitor aortic stenosis with echocardiograms. The patient can be discharged home from a cardiac standpoint.  No other issues. __________   2D echo 12/2/201.  1. Left ventricular ejection fraction, by estimation, is 60 to 65%. The  left ventricle has normal function. The left ventricle has no regional  wall motion abnormalities. There is mild to moderate left ventricular  hypertrophy. Left ventricular diastolic  parameters are consistent with Grade I diastolic dysfunction (impaired  relaxation). The average left ventricular global longitudinal strain is  -15.3 %. The global longitudinal strain is abnormal.   2. Right ventricular systolic function is normal. The right ventricular  size is normal.   3. Left atrial size was mildly dilated.   4. The aortic valve is moderately calcified. Aortic valve regurgitation  is mild. Moderate aortic valve stenosis. Aortic valve area, by VTI  measures 1.29 cm. Aortic valve mean gradient measures 22.2 mmHg. Aortic  valve Vmax measures 3.34 m/s. __________   2D echo 08/23/2019: 1. The left ventricle has normal systolic function with an ejection  fraction of 60-65%. The cavity size was normal. There is mild concentric  left ventricular hypertrophy. Left ventricular diastolic Doppler  parameters are consistent with impaired  relaxation.   2. The right ventricle has normal systolic function. The cavity was  normal. There is No increase in right ventricular wall thickness.   3. Left atrial size was normal.   4. Right atrial size was normal.   5. The mitral valve is abnormal. mild calcification of the mitral  valve  leaflet.   6. The aortic valve is abnormal. Moderate sclerosis of the aortic valve.  Aortic valve regurgitation is mild by color flow Doppler. Mild-moderate  stenosis of the aortic valve. DVI 0.36   7. The aorta is normal unless otherwise noted.   8. The pulmonic valve was not well visualized. Pulmonic valve  regurgitation was not assessed by color flow Doppler. __________   Coronary CTA 10/05/2018: FINDINGS: Non-cardiac: See separate report from [facility]. No significant findings on limited lung and soft tissue windows.   Calcium  score: Dense calcium  noted in LAD and RCA   Coronary Arteries: Right dominant with no anomalies   LM: Distal LM less than 50% calcified stenosis   LAD: Ostial and mid LAD less  than 50% calcific stenosis   D1: Small vessel less than 30% mixed plaque   D2: Large branching vessel 50% calcific plaque proximally   Circumflex: Less than 50% calcified plaque at ostium   OM1: Small branch without significant disease   RCA: 50% or less calcific plaque in proximal mid and distal vessel   PDA: Less than 30% calcific plaque   PLA: Less than 30% calcific plaque   IMPRESSION: 1. Calcific aortic atherosclerosis moderate Normal aortic root 2.9 cm 2.  Calcified Aortic Valve suggest TTE correlation for AS 3.  Calcium  score 521 which is 94 th percentile for age and sex 4. Moderate non obstructive appearing disease primarily in RCA and LAD as well as D2 Study will be sent for FFR CT __________   2D echo 08/17/2018: - Left ventricle: The cavity size was at the upper limits of    normal. Wall thickness was increased in a pattern of mild LVH.    Systolic function was normal. The estimated ejection fraction was    in the range of 60% to 65%. Wall motion was normal; there were no    regional wall motion abnormalities. Doppler parameters are    consistent with abnormal left ventricular relaxation (grade 1    diastolic dysfunction).  - Aortic  valve: Transvalvular velocity was increased. There was    mild to moderate stenosis. There was mild to moderate    regurgitation. Mean gradient (S): 19 mm Hg. Valve area (VTI):    1.28 cm^2.  - Mitral valve: Calcified annulus.  - Left atrium: The atrium was mildly dilated.  - Right ventricle: The cavity size was normal. Systolic function    was normal. ___________   2D echo 08/05/2016: - Left ventricle: The cavity size was normal. There was mild focal    basal hypertrophy of the septum. Systolic function was normal.    The estimated ejection fraction was in the range of 60% to 65%.    Wall motion was normal; there were no regional wall motion    abnormalities. Left ventricular diastolic function parameters    were normal for the patient&'s age.  - Aortic valve: Calcified annulus. Probably trileaflet. There was    mild stenosis, based on peak velocity and mean gradient. There    was moderate regurgitation. __________   2D echo 03/17/2012: - Left ventricle: The cavity size was normal. There was mild    to moderate concentric hypertrophy. Systolic function was    normal. The estimated ejection fraction was in the range    of 55% to 60%. Wall motion was normal; there were no    regional wall motion abnormalities. Doppler parameters are    consistent with abnormal left ventricular relaxation    (grade 1 diastolic dysfunction).  - Aortic valve: Mild regurgitation.  - Mitral valve: Mild regurgitation.  - Right ventricle: Systolic function was normal.  - Pulmonary arteries: Systolic pressure was within the    normal range.   EKG:  EKG is not ordered today.    Recent Labs: 03/29/2024: ALT 14; B Natriuretic Peptide 39.2 03/31/2024: Magnesium  2.4 04/28/2024: BUN 13; Creatinine, Ser 0.95; Hemoglobin 13.5; Platelets 194; Potassium 4.9; Sodium 135  Recent Lipid Panel    Component Value Date/Time   CHOL 141 04/05/2024 1625   TRIG 211 (H) 04/05/2024 1625   HDL 60 04/05/2024 1625   CHOLHDL  2.4 04/05/2024 1625   CHOLHDL 2 01/28/2023 1158   VLDL 18.8 01/28/2023 1158   LDLCALC 48 04/05/2024 1625  LDLDIRECT 209.2 05/28/2009 0909    PHYSICAL EXAM:    VS:  BP 130/62   Pulse 78   Ht 5' 7.5 (1.715 m)   Wt 230 lb 12.8 oz (104.7 kg)   SpO2 96%   BMI 35.61 kg/m   BMI: Body mass index is 35.61 kg/m.  Physical Exam Vitals reviewed.  Constitutional:      Appearance: She is well-developed.  HENT:     Head: Normocephalic and atraumatic.  Eyes:     General:        Right eye: No discharge.        Left eye: No discharge.  Neck:     Vascular: No JVD.  Cardiovascular:     Rate and Rhythm: Normal rate and regular rhythm.     Heart sounds: Normal heart sounds, S1 normal and S2 normal. Heart sounds not distant. No midsystolic click and no opening snap. No murmur heard.    No friction rub.  Pulmonary:     Effort: Pulmonary effort is normal. No respiratory distress.     Breath sounds: Normal breath sounds. No decreased breath sounds, wheezing, rhonchi or rales.  Abdominal:     General: There is no distension.     Palpations: Abdomen is soft.     Tenderness: There is no abdominal tenderness.  Musculoskeletal:     Cervical back: Normal range of motion.     Right lower leg: No edema.     Left lower leg: No edema.  Skin:    General: Skin is warm and dry.     Nails: There is no clubbing.  Neurological:     Mental Status: She is alert and oriented to person, place, and time.  Psychiatric:        Speech: Speech normal.        Behavior: Behavior normal.        Thought Content: Thought content normal.        Judgment: Judgment normal.     Wt Readings from Last 3 Encounters:  07/05/24 230 lb 12.8 oz (104.7 kg)  05/30/24 230 lb 9.6 oz (104.6 kg)  05/25/24 230 lb 12.8 oz (104.7 kg)     ASSESSMENT & PLAN:   Nonobstructive CAD: She is without symptoms of angina or cardiac decompensation.  Recent LHC in 09/2023 showed minimal nonobstructive disease.  Continue aggressive risk  factor modification and primary prevention including aspirin  81 mg and rosuvastatin  40 mg.  No indication for further ischemic testing at this time.  Diastolic dysfunction: Euvolemic and well compensated.  No longer on SGLT2 inhibitor, MRA, or loop diuretic.  I do think she would benefit from having furosemide  to use on an as needed basis with possible addition of MRA moving forward.  Check BNP and BMP with planned to at least initiate as needed furosemide  based on labs.  Agree with family recommendation for patient to get out and walk at local mall in the air conditioning in an effort to improve her conditioning/functional status.  Moderate to severe aortic stenosis: Status post TAVR.  Normal functioning valvular prosthesis on echo in 04/2024.  She remains on aspirin  81 mg.  SBE prophylaxis.  HLD: LDL 48 in 03/2024.  Continue rosuvastatin  40 mg.  Carotid artery stenosis: Ultrasound from 10/2023 showed 1 to 39% bilateral carotid artery stenosis that was stable since 2022.  She remains on aspirin  and statin as outlined above.  COPD: Stable without acute exacerbation.  I suspect her dyspnea associated with excessive feet is related  to underlying COPD and less likely diastolic dysfunction.    Disposition: F/u with Dr. Darron or an APP in 3 months.   Medication Adjustments/Labs and Tests Ordered: Current medicines are reviewed at length with the patient today.  Concerns regarding medicines are outlined above. Medication changes, Labs and Tests ordered today are summarized above and listed in the Patient Instructions accessible in Encounters.   Signed, Bernardino Bring, PA-C 07/05/2024 4:53 PM     [facility] [place] [address] [place], [place] [address] [phone]

## 2024-07-05 ENCOUNTER — Ambulatory Visit: Attending: Physician Assistant | Admitting: Physician Assistant

## 2024-07-05 ENCOUNTER — Encounter: Payer: Self-pay | Admitting: Physician Assistant

## 2024-07-05 VITALS — BP 130/62 | HR 78 | Ht 67.5 in | Wt 230.8 lb

## 2024-07-05 DIAGNOSIS — I2510 Atherosclerotic heart disease of native coronary artery without angina pectoris: Secondary | ICD-10-CM | POA: Insufficient documentation

## 2024-07-05 DIAGNOSIS — Z952 Presence of prosthetic heart valve: Secondary | ICD-10-CM | POA: Diagnosis not present

## 2024-07-05 DIAGNOSIS — I6523 Occlusion and stenosis of bilateral carotid arteries: Secondary | ICD-10-CM | POA: Diagnosis not present

## 2024-07-05 DIAGNOSIS — I5189 Other ill-defined heart diseases: Secondary | ICD-10-CM | POA: Insufficient documentation

## 2024-07-05 DIAGNOSIS — I350 Nonrheumatic aortic (valve) stenosis: Secondary | ICD-10-CM | POA: Insufficient documentation

## 2024-07-05 DIAGNOSIS — E785 Hyperlipidemia, unspecified: Secondary | ICD-10-CM | POA: Diagnosis not present

## 2024-07-05 DIAGNOSIS — Z79899 Other long term (current) drug therapy: Secondary | ICD-10-CM | POA: Insufficient documentation

## 2024-07-05 DIAGNOSIS — J449 Chronic obstructive pulmonary disease, unspecified: Secondary | ICD-10-CM | POA: Insufficient documentation

## 2024-07-05 DIAGNOSIS — I5033 Acute on chronic diastolic (congestive) heart failure: Secondary | ICD-10-CM | POA: Diagnosis not present

## 2024-07-05 NOTE — Patient Instructions (Signed)
 Medication Instructions:  Your physician recommends that you continue on your current medications as directed. Please refer to the Current Medication list given to you today.   *If you need a refill on your cardiac medications before your next appointment, please call your pharmacy*  Lab Work: Your provider would like for you to have following labs drawn today BMeT and BNP.   If you have labs (blood work) drawn today and your tests are completely normal, you will receive your results only by: MyChart Message (if you have MyChart) OR A paper copy in the mail If you have any lab test that is abnormal or we need to change your treatment, we will call you to review the results.  Testing/Procedures: None ordered at this time   Follow-Up: At [facility], you and your health needs are our priority.  As part of our continuing mission to provide you with exceptional heart care, our providers are all part of one team.  This team includes your primary Cardiologist (physician) and Advanced Practice Providers or APPs (Physician Assistants and Nurse Practitioners) who all work together to provide you with the care you need, when you need it.  Your next appointment:   3 month(s)  Provider:   You may see Deatrice Cage, MD or Bernardino Bring, PA-C

## 2024-07-06 ENCOUNTER — Ambulatory Visit: Payer: Self-pay | Admitting: Physician Assistant

## 2024-07-06 DIAGNOSIS — Z79899 Other long term (current) drug therapy: Secondary | ICD-10-CM

## 2024-07-06 LAB — BASIC METABOLIC PANEL WITH GFR
BUN/Creatinine Ratio: 13 (ref 12–28)
BUN: 11 mg/dL (ref 8–27)
CO2: 21 mmol/L (ref 20–29)
Calcium: 9.2 mg/dL (ref 8.7–10.3)
Chloride: 101 mmol/L (ref 96–106)
Creatinine, Ser: 0.86 mg/dL (ref 0.57–1.00)
Glucose: 152 mg/dL — ABNORMAL HIGH (ref 70–99)
Potassium: 4.1 mmol/L (ref 3.5–5.2)
Sodium: 136 mmol/L (ref 134–144)
eGFR: 71 mL/min/1.73 (ref >59)

## 2024-07-06 LAB — BRAIN NATRIURETIC PEPTIDE: BNP: 21.8 pg/mL (ref 0.0–100.0)

## 2024-07-11 MED ORDER — SPIRONOLACTONE 25 MG PO TABS: 12.5000 mg | ORAL_TABLET | Freq: Every day | ORAL | 3 refills | Status: AC

## 2024-07-11 MED ORDER — FUROSEMIDE 20 MG PO TABS: 20.0000 mg | ORAL_TABLET | Freq: Every day | ORAL | 3 refills | Status: AC | PRN

## 2024-08-01 ENCOUNTER — Other Ambulatory Visit: Payer: Self-pay | Admitting: Family Medicine

## 2024-08-01 DIAGNOSIS — R519 Headache, unspecified: Secondary | ICD-10-CM

## 2024-08-04 ENCOUNTER — Encounter: Payer: Self-pay | Admitting: Internal Medicine

## 2024-08-08 ENCOUNTER — Ambulatory Visit: Payer: Self-pay | Admitting: Internal Medicine

## 2024-08-13 NOTE — Progress Notes (Signed)
 Amanda Farve T. Amanda Nickolson, MD, CAQ [facility] [address] [place], [address]  Phone: [phone]  FAX: [phone]  Amanda [name] - 73 y.o. female  MRN [id number]  Date of Birth: [birth year]  Date: 08/21/2024  PCP: Watt Mirza, MD  Referral: Watt Mirza, MD  Chief Complaint  Patient presents with   Amanda [name] 3 weeks ago  Has had pain on entire left side    Mass    Pt reports a knot on the right side of neck under jaw Has been present for a couple years, states it has moved from back of head to under jaw   Depression    Pt ran out of fluoxetine , pt reports feeling clearer minded since she has stopped taking it and would like to discuss staying off of it.    Subjective:   Amanda [name] is a 73 y.o. very pleasant female patient with Body mass index is 36.42 kg/m. who presents with the following:  Discussed the use of AI scribe software for clinical note transcription with the patient, who gave verbal consent to proceed.  Amanda [name] is a well-known patient, entire family known well.  Husband recently died within the past year.  She has some rib pain and has history of prior rib fractures from trauma and fall. She also has severe depression and anxiety.   History of Present Illness Amanda [name] is a 73 year old female with COPD who presents with left-sided pain following a fall. She is accompanied by her daughter.  Three weeks ago, she fell on a cement floor while attempting to sit on a stool and missed, landing on her left side. She has persistent soreness extending from her left side up to her lower back and neck, accompanied by headaches. She also has rib pain in the area where she fell and has a history of rib fractures.  Her breathing has been generally good, though she occasionally experiences wheezing, for which she uses a Pulmicort  nebulizer and albuterol  inhaler. She has quit smoking and has  not resumed the habit.  She has a history of depression and has been on fluoxetine  for many years. She ran out of her prescription a week ago and has not been taking it since. She feels less 'foggy headed' and has more energy since stopping the medication.  She continues to take Wellbutrin , 150 mg a day.  Since the fall, she has experienced increased pain in her neck and lower back, which worsens with activity. She also reports a change in her grip strength over the last few weeks, making it difficult to hold objects, particularly with her left hand. She has a history of a hand fracture with a plate and screws, and she experiences pain and swelling in the hand, especially when gripping for multiple months.  She has a history of a hiatal hernia and reports that the pain she associates with the hernia is now in a different area since her last surgery in January.   She is awaiting the delivery of oxygen equipment following a sleep study and prescription from her pulmonologist.   Review of Systems is noted in the HPI, as appropriate  Objective:   BP 132/80   Pulse 81   Temp (!) 97.5 F (36.4 C) (Temporal)   Ht 5' 7.5 (1.715 m)   Wt 236 lb (107 kg)   SpO2 95%   BMI 36.42 kg/m  GEN: No acute distress; alert,appropriate. PULM: Breathing comfortably in no respiratory distress PSYCH: Normally interactive.    CERVICAL SPINE EXAM Range of motion: Flexion, extension, lateral bending, and rotation: She has a 40% loss of motion in all directions Spurling's: Negative Pain with terminal motion: Yes Spinous Processes: NT SCM: NT Upper paracervical muscles: Tender to palpation, worse in the left Upper traps: To palpation worse on the left C5-T1 intact, sensation and motor    Range of motion at  the waist: Flexion, extension, lateral bending and rotation: Moderate restriction of motion  No echymosis or edema Rises to examination table with mild difficulty Gait: minimally  antalgic  Inspection/Deformity: N Paraspinus Tenderness: L3-S1 bilaterally  B Ankle Dorsiflexion (L5,4): 5/5 B Great Toe Dorsiflexion (L5,4): 5/5 Heel Walk (L5): WNL Toe Walk (S1): WNL Rise/Squat (L4): WNL, mild pain  SENSORY Soft touch normal  B SLR, seated: neg B SLR, supine: neg B Greater Troch: NT B Log Roll: neg B Sciatic Notch: NT   Physical Exam SKIN: Multiple scratches and bites on arm  Laboratory and Imaging Data:  Assessment and Plan:     ICD-10-CM   1. Cervicalgia  M54.2 DG Cervical Spine Complete    2. Major depressive disorder, recurrent episode, in partial remission (HCC)  F33.41     3. GAD (generalized anxiety disorder)  F41.1     4. Chronic bilateral low back pain with left-sided sciatica  G89.29 DG Lumbar Spine Complete   M54.42     5. Controlled type 2 diabetes mellitus with diabetic autonomic neuropathy, without long-term current use of insulin  (HCC)  E11.43 AMB Referral [facility] Care Management    6. S/P TAVR (transcatheter aortic valve replacement)  Z95.2     7. Coronary artery disease involving native coronary artery of native heart without angina pectoris  I25.10 AMB Referral [facility] Care Management     Assessment & Plan Chronic pain involving neck, left arm, and lower back after fall Chronic pain in neck, left arm, and lower back post-fall with possible rib fracture and concerns about hardware integrity from previous surgeries. - Order x-rays to assess hardware integrity in neck and back -hardware appears normal on my review. - Advise against long-term neck brace use to prevent muscle weakness. - Recommend neck exercises to improve range of motion. - We will give her a round of steroids  Left hand pain and dysfunction due to post-traumatic arthritis and prior fracture Chronic left hand pain and dysfunction from post-traumatic arthritis with previous fracture and surgical hardware.  COPD, currently stable COPD stable with inhalers and  nebulizers, no recent exacerbations, benefits from smoking cessation.  Chronic respiratory failure with hypoxia and hypercapnia, not yet on home oxygen prescription post-sleep study.  Depression Depression with improved mental clarity after stopping fluoxetine , requires monitoring for worsening symptoms. - Monitor for worsening depression symptoms after stopping fluoxetine . - Consider alternative antidepressant if depression worsens.  Type 2 diabetes mellitus, well controlled, not on insulin  Type 2 diabetes well controlled without insulin , concerns about irregular blood sugar monitoring. - Attempt to get approval for a continuous glucose monitor to assist with blood sugar monitoring. - We will see if pharmacy can assist with this and also assist with Jardiance   Medication Management during today's office visit: Meds ordered this encounter  Medications   predniSONE  (DELTASONE ) 20 MG tablet    Sig: 2 tabs po for 4 days, then 1 tab po for 4 days    Dispense:  12 tablet    Refill:  0  There are no discontinued medications.  Orders placed today for conditions managed today: Orders Placed This Encounter  Procedures   DG Lumbar Spine Complete   DG Cervical Spine Complete   AMB Referral [facility] Care Management    Disposition: Return in about 6 months (around 02/18/2025) for Physical.  [employer] One speech-to-text software was used for transcription in this dictation.  Possible transcriptional errors can occur using Animal nutritionist.   Signed,  Jacques DASEN. Athan Casalino, MD   Outpatient Encounter Medications as of 08/21/2024  Medication Sig   acetaminophen  (TYLENOL ) 500 MG tablet Take 1,000 mg by mouth every 8 (eight) hours as needed for mild pain (pain score 1-3) or moderate pain (pain score 4-6).   albuterol  (VENTOLIN  HFA) 108 (90 Base) MCG/ACT inhaler Inhale 2 puffs into the lungs every 4 (four) hours as needed for wheezing or shortness of breath.   amitriptyline  (ELAVIL ) 50 MG  tablet TAKE 1/2 (ONE-HALF) TABLET BY MOUTH AT BEDTIME   Ascorbic Acid  (VITAMIN C ) 1000 MG tablet Take 1,000 mg by mouth daily.   aspirin  EC 81 MG tablet Take 81 mg by mouth daily.   budesonide  (PULMICORT ) 0.5 MG/2ML nebulizer solution Take 2 mLs (0.5 mg total) by nebulization 2 (two) times daily.   buPROPion  (WELLBUTRIN  XL) 150 MG 24 hr tablet Take 1 tablet by mouth once daily   calcium  carbonate (OS-CAL - DOSED IN MG OF ELEMENTAL CALCIUM ) 1250 (500 Ca) MG tablet Take 1 tablet by mouth daily at 6 (six) AM.   calcium  carbonate (TUMS - DOSED IN MG ELEMENTAL CALCIUM ) 500 MG chewable tablet Chew 2 tablets by mouth daily as needed for indigestion or heartburn.   Cholecalciferol  (VITAMIN D ) 1000 UNITS capsule Take 2,000 Units by mouth daily.   clonazePAM  (KLONOPIN ) 0.5 MG tablet Take 1 tablet by mouth twice daily as needed   cyanocobalamin  (VITAMIN B12) 1000 MCG/ML injection Inject into the muscle.   cyclobenzaprine  (FLEXERIL ) 10 MG tablet Take 1 tablet by mouth three times daily as needed for muscle spasm   dimenhyDRINATE (DRAMAMINE) 50 MG tablet Take 50 mg by mouth every 8 (eight) hours as needed for nausea or dizziness.   donepezil  (ARICEPT ) 5 MG tablet TAKE 1 TABLET BY MOUTH AT BEDTIME   ezetimibe  (ZETIA ) 10 MG tablet Take 1 tablet by mouth once daily   fluticasone  (FLONASE) 50 MCG/ACT nasal spray Place 2 sprays into both nostrils daily as needed for rhinitis.   furosemide  (LASIX ) 20 MG tablet Take 1 tablet (20 mg total) by mouth daily as needed (for increased shortness of breath or weight gain greater than 3 pounds overnight.).   gabapentin  (NEURONTIN ) 100 MG capsule Take 100 mg by mouth.   guaiFENesin  (MUCINEX ) 600 MG 12 hr tablet Take 600 mg by mouth 2 (two) times daily as needed for to loosen phlegm.   ipratropium-albuterol  (DUONEB) 0.5-2.5 (3) MG/3ML SOLN Take 3 mLs by nebulization every 6 (six) hours as needed.   lisinopril  (ZESTRIL ) 5 MG tablet Take 5 mg by mouth daily.   magnesium  oxide  (MAG-OX) 400 (240 Mg) MG tablet Take 400 mg by mouth daily.   metoprolol  tartrate (LOPRESSOR ) 25 MG tablet Take 1/2 (one-half) tablet by mouth twice daily   Multiple Vitamin (MULTIVITAMIN) tablet Take 1 tablet by mouth daily.   naproxen  sodium (ALEVE ) 220 MG tablet Take 220 mg by mouth in the morning.   pantoprazole  (PROTONIX ) 40 MG tablet TAKE 1 TABLET BY MOUTH ONCE DAILY 30  MINUTES  PRIOR  TO  BREAKFAST  predniSONE  (DELTASONE ) 20 MG tablet 2 tabs po for 4 days, then 1 tab po for 4 days   rosuvastatin  (CRESTOR ) 40 MG tablet Take 1 tablet by mouth once daily   Simethicone  125 MG CAPS Take 125 mg by mouth daily as needed (gas).   spironolactone  (ALDACTONE ) 25 MG tablet Take 0.5 tablets (12.5 mg total) by mouth daily.   vitamin E 180 MG (400 UNITS) capsule Take 400 Units by mouth daily.   empagliflozin  (JARDIANCE ) 10 MG TABS tablet Take 1 tablet (10 mg total) by mouth daily. (Patient not taking: Reported on 08/21/2024)   FLUoxetine  (PROZAC ) 40 MG capsule Take 80 mg by mouth M, W, F and 40 mg by mouth Tues, Thurs, Sat and Sunday (Patient not taking: Reported on 08/21/2024)   No facility-administered encounter medications on file as of 08/21/2024.

## 2024-08-16 ENCOUNTER — Ambulatory Visit (INDEPENDENT_AMBULATORY_CARE_PROVIDER_SITE_OTHER): Admitting: Podiatry

## 2024-08-16 DIAGNOSIS — M79676 Pain in unspecified toe(s): Secondary | ICD-10-CM | POA: Diagnosis not present

## 2024-08-16 DIAGNOSIS — B351 Tinea unguium: Secondary | ICD-10-CM | POA: Diagnosis not present

## 2024-08-16 DIAGNOSIS — E1142 Type 2 diabetes mellitus with diabetic polyneuropathy: Secondary | ICD-10-CM | POA: Diagnosis not present

## 2024-08-16 NOTE — Progress Notes (Signed)
 Presents today chief complaint of painfully elongated toenails.  Objective: Toenails are long thick yellow dystrophic clinically mycotic pulses are palpable.  No open lesions or wounds.  Assessment: Pain limb secondary to onychomycosis.  Plan: Debridement of toenails 1 through 5 bilateral.

## 2024-08-21 ENCOUNTER — Telehealth: Payer: Self-pay

## 2024-08-21 ENCOUNTER — Encounter: Payer: Self-pay | Admitting: Family Medicine

## 2024-08-21 ENCOUNTER — Ambulatory Visit: Admitting: Family Medicine

## 2024-08-21 ENCOUNTER — Ambulatory Visit
Admission: RE | Admit: 2024-08-21 | Discharge: 2024-08-21 | Disposition: A | Discharge status: Home / Self Care | Source: Ambulatory Visit | Attending: Family Medicine | Admitting: Family Medicine

## 2024-08-21 VITALS — BP 132/80 | HR 81 | Temp 97.5°F | Ht 67.5 in | Wt 236.0 lb

## 2024-08-21 DIAGNOSIS — I2510 Atherosclerotic heart disease of native coronary artery without angina pectoris: Secondary | ICD-10-CM

## 2024-08-21 DIAGNOSIS — Z952 Presence of prosthetic heart valve: Secondary | ICD-10-CM | POA: Diagnosis not present

## 2024-08-21 DIAGNOSIS — E1143 Type 2 diabetes mellitus with diabetic autonomic (poly)neuropathy: Secondary | ICD-10-CM | POA: Diagnosis not present

## 2024-08-21 DIAGNOSIS — F3341 Major depressive disorder, recurrent, in partial remission: Secondary | ICD-10-CM

## 2024-08-21 DIAGNOSIS — M542 Cervicalgia: Secondary | ICD-10-CM

## 2024-08-21 DIAGNOSIS — R0781 Pleurodynia: Secondary | ICD-10-CM

## 2024-08-21 DIAGNOSIS — M5442 Lumbago with sciatica, left side: Secondary | ICD-10-CM

## 2024-08-21 DIAGNOSIS — F411 Generalized anxiety disorder: Secondary | ICD-10-CM | POA: Diagnosis not present

## 2024-08-21 DIAGNOSIS — G8929 Other chronic pain: Secondary | ICD-10-CM

## 2024-08-21 MED ORDER — PREDNISONE 20 MG PO TABS: ORAL_TABLET | ORAL | 0 refills | Status: AC

## 2024-08-21 NOTE — Telephone Encounter (Signed)
 Copied from CRM [id number]. Topic: Clinical - Order For Equipment >> Aug 18, 2024  2:18 PM Rilla B wrote: Reason for CRM: Marcey from [employer] checking up on the order for the home concentrator unit. He faxed order again.  [employer] fax [phone]   ----------------------------------------------------------------------- From previous Reason for Contact - Other: Reason for CRM:

## 2024-08-21 NOTE — Progress Notes (Unsigned)
 Care Guide Pharmacy Note  08/21/2024 Name: [name] MRN: [id number] DOB: [birth year]  Referred By: Watt Mirza, MD Reason for referral: Complex Care Management and Call Attempt #1 (Unsuccessful initial outreach to schedule with PHARM D- Manuelita)   Amanda [name] is a 73 y.o. year old female who is a primary care patient of Copland, Mirza, MD.  Barnie LELON Gasmen was referred to the pharmacist for assistance related to: DMII  An unsuccessful telephone outreach was attempted today to contact the patient who was referred to the pharmacy team for assistance with medication assistance. Additional attempts will be made to contact the patient.  Leotis Rase [facility] Guide  Direct Dial: [phone]  Fax [phone]

## 2024-08-22 NOTE — Telephone Encounter (Signed)
Form has been signed and faxed back. Nothing further needed 

## 2024-08-22 NOTE — Progress Notes (Unsigned)
 Care Guide Pharmacy Note  08/22/2024 Name: Amanda [name] MRN: [id number] DOB: 1951-05-14  Referred By: Watt Mirza, MD Reason for referral: Complex Care Management, Call Attempt #1 (Unsuccessful initial outreach to schedule with PHARM DGLENWOOD Shaver), and Call Attempt #2 (Unsuccessful initial outreach to schedule with PHARM D)   Amanda [name] is a 73 y.o. year old female who is a primary care patient of Copland, Mirza, MD.  Amanda [name] was referred to the pharmacist for assistance related to: DMII  A second unsuccessful telephone outreach was attempted today to contact the patient who was referred to the pharmacy team for assistance with medication assistance. Additional attempts will be made to contact the patient.  Amanda [name], [name]  Direct Dial: [phone]  Fax [phone]

## 2024-08-23 NOTE — Progress Notes (Signed)
 Complex Care Management Note Care Guide Note  08/23/2024 Name: Amanda [name] MRN: [id number] DOB: 28-Mar-1951   Complex Care Management Outreach Attempts: A third unsuccessful outreach was attempted today to offer the patient with information about available complex care management services.  Follow Up Plan:  No further outreach attempts will be made at this time. We have been unable to contact the patient to offer or enroll patient in complex care management services.  Encounter Outcome:  No Answer-Left voicemail  Leotis Rase [facility] Guide  Direct Dial: [phone]  Fax [phone]

## 2024-08-27 ENCOUNTER — Ambulatory Visit: Payer: Self-pay | Admitting: Family Medicine

## 2024-08-30 ENCOUNTER — Ambulatory Visit: Admitting: Internal Medicine

## 2024-08-30 ENCOUNTER — Encounter: Payer: Self-pay | Admitting: Internal Medicine

## 2024-08-30 VITALS — BP 120/70 | HR 84 | Temp 98.6°F | Ht 66.0 in | Wt 235.4 lb

## 2024-08-30 DIAGNOSIS — G4734 Idiopathic sleep related nonobstructive alveolar hypoventilation: Secondary | ICD-10-CM | POA: Diagnosis not present

## 2024-08-30 DIAGNOSIS — J4540 Moderate persistent asthma, uncomplicated: Secondary | ICD-10-CM

## 2024-08-30 NOTE — Patient Instructions (Signed)
 Start oxygen therapy tonight Continue nebulized therapy as prescribed DME referral for nebulizer mask Follow-up with cardiology as scheduled  Avoid Allergens and Irritants Avoid secondhand smoke Avoid SICK contacts Recommend  Masking  when appropriate Recommend Keep up-to-date with vaccinations

## 2024-08-30 NOTE — Progress Notes (Unsigned)
 [facility]  Pulmonary Medicine Consultation      Date: 08/30/2024,   MRN# [id number] Amanda [name] 05/31/1951  Pulmonary function testing June 2025  Post bronchodilator FEV1 FVC ratio is 75% predicted FEV1 90% predicted FVC 90% predicted pPositive bronchodilator response with 15% change TLC is 93% predicted RV is 110% predicted RV/TLC ratio is 114% predicted Flow-volume loops consistent with obstructive pattern in the expiratory limb Final interpretation small obstructive airways disease with a bronchodilator response and hyperinflation and air trapping    CHIEF COMPLAINT:   Assessment of COPD Assessment chronic hypoxic respiratory failure  HISTORY OF PRESENT ILLNESS   73 year old pleasant white female seen today for following hospitalization for acute COPD exacerbation, extensive cardiac history  Assessment of shortness of breath Patient has extensive smoking history 1 pack a day for the last 50 years Patient quit January 2025 when she had her AV replaced Patient sees Dr. Darron patient with a history of cardiac demand ischemia Echo showed normal pump function, normal wall motion, mild thickening and stiffening of the heart, normal right-sided pump function, and normal functioning aortic valve replacement.  Patient using Pulmicort  nebulizers as needed However when she uses her nebulizer therapy she feels much better Patient received her oxygen therapy we will initiate tonight  No exacerbation at this time No evidence of heart failure at this time No evidence or signs of infection at this time No respiratory distress No fevers, chills, nausea, vomiting, diarrhea No evidence of lower extremity edema No evidence hemoptysis   Overnight pulse oximetry showed significant hypoxia and started on oxygen   Cardiology history 73 y.o. female with history of nonobstructive CAD by LHC most recently in 09/2023, moderate to severe aortic valve stenosis status post TAVR in  12/2023, diet-controlled diabetes, COPD, HTN, HLD,  mild positional OSA, and mild cognitive impairment followed by neurology   -Coronary CTA in 08/2018 showed a calcium  score of 521 with moderate nonobstructive disease involving the LAD and RCA that were not significant by ctFFR.   -Echo in 08/2019 demonstrated an EF of 60 to 65% with moderate aortic stenosis with a mean gradient of 19 mmHg and a valve area of 1.28 cm.  Repeat echo in 11/2020 showed an EF of 60 to 65%, grade 1 diastolic dysfunction, and moderate aortic stenosis with a mean gradient of 22 mmHg and a valve area of 1.29 cm.  -11/2020 with chest pain and worsening exertional dyspnea.  R/LHC showed mild nonobstructive CAD with RHC showing normal filling pressures, normal pulmonary pressure, and normal cardiac output.  Aortic stenosis was moderate with a mean gradient of 24 mmHg and a valve area of 1.44 cm.  Carotid artery ultrasound in 06/2021 showed 1 to 39% bilateral ICA stenoses with antegrade flow of the bilateral vertebral arteries and normal flow hemodynamics of the bilateral subclavian arteries.   -07/2021 leading to 2 broken ribs.  She was admitted in 11/2021 with right hand cellulitis/abscess due to an infected cat bite.  Echo during that admission demonstrated an EF of 65 to 70%, no regional wall motion abnormalities, mild LVH, normal LV diastolic function parameters, normal RV systolic function and ventricular cavity size, mild to moderate aortic valve insufficiency, and moderate aortic valve stenosis with a mean gradient of 29.2 mmHg.   -  Lexiscan  MPI on 07/16/2022 showed no evidence of ischemia or infarction with normal LVEF and was overall low risk.  ABI normal bilaterally in 08/2022.  Echo in 11/2022 showed an EF of 65 to 70%, no regional  wall motion abnormalities, mild LVH, grade 1 diastolic dysfunction mild aortic insufficiency, moderate aortic stenosis with a mean gradient of 30 mmHg and a valve area of 1.53 cm, and an estimated right  atrial pressure of 8 mmHg.   PAST MEDICAL HISTORY   Past Medical History:  Diagnosis Date   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 sting    Aortic stenosis    a. 2013: nl LV sys fxn, mild MR, no evidence of pulm htn; b. TTE 8/17: EF 60-65%, no RWMA, nl LV dia fxn,  mild AS, mod AI   CAD (coronary artery disease), native coronary artery 11/26/2020   Controlled type 2 diabetes mellitus with diabetic autonomic neuropathy, without long-term current use of insulin  (HCC) 08/10/2007   COPD (chronic obstructive pulmonary disease) (HCC) 10/30/2016   Depression    GAD (generalized anxiety disorder)    GERD (gastroesophageal reflux disease)    Hiatal hernia with gastroesophageal reflux 1997   Hyperlipidemia    Hypertension    Osteoporosis    S/P TAVR (transcatheter aortic valve replacement) 12/21/2023   s/p TAVR with a 34 mm Evolut FX via the TF approach by Dr. Wendel & Dr. Maryjane   Tobacco abuse      SURGICAL HISTORY   Past Surgical History:  Procedure Laterality Date   ABDOMINAL HYSTERECTOMY  1993   ANTERIOR CERVICAL DECOMP/DISCECTOMY FUSION N/A 10/26/2018   Procedure: ACDF C4-C5 C5-C6 C6-C7;  Surgeon: Onetha Kuba, MD;  Location[facility] OR;  Service: Neurosurgery;  Laterality: N/A;   APPENDECTOMY     BACK SURGERY     BREAST BIOPSY Right 2007   benign   CHOLECYSTECTOMY     DILATION AND CURETTAGE OF UTERUS     ESOPHAGEAL DILATION     x 3   ESOPHAGOGASTRODUODENOSCOPY (EGD) WITH PROPOFOL  N/A 02/06/2022   Procedure: ESOPHAGOGASTRODUODENOSCOPY (EGD) WITH PROPOFOL ;  Surgeon: Therisa Bi, MD;  Location: [facility] ENDOSCOPY;  Service: Gastroenterology;  Laterality: N/A;   ESOPHAGOGASTRODUODENOSCOPY (EGD) WITH PROPOFOL  N/A 02/26/2022   Procedure: ESOPHAGOGASTRODUODENOSCOPY (EGD) WITH PROPOFOL ;  Surgeon: Therisa Bi, MD;  Location: [facility] ENDOSCOPY;  Service: Gastroenterology;  Laterality: N/A;  EGD+dilation per Dr. Therisa   INCISION AND DRAINAGE Right 12/02/2021   Procedure: INCISION AND DRAINAGE;  Surgeon: Kathlynn Sharper,  MD;  Location: [facility] ORS;  Service: Orthopedics;  Laterality: Right;   INTRAOPERATIVE TRANSTHORACIC ECHOCARDIOGRAM N/A 12/21/2023   Procedure: INTRAOPERATIVE TRANSTHORACIC ECHOCARDIOGRAM;  Surgeon: Wendel Lurena POUR, MD;  Location: [facility] INVASIVE CV LAB;  Service: Cardiovascular;  Laterality: N/A;   LEFT HEART CATH AND CORONARY ANGIOGRAPHY N/A 11/11/2020   Procedure: LEFT HEART CATH AND CORONARY ANGIOGRAPHY;  Surgeon: Darron Deatrice LABOR, MD;  Location: [facility] INVASIVE CV LAB;  Service: Cardiovascular;  Laterality: N/A;   OOPHORECTOMY     OVARIAN CYST REMOVAL  1972   RIGHT/LEFT HEART CATH AND CORONARY ANGIOGRAPHY Bilateral 10/04/2023   Procedure: RIGHT/LEFT HEART CATH AND CORONARY ANGIOGRAPHY;  Surgeon: Darron Deatrice LABOR, MD;  Location: [facility] INVASIVE CV LAB;  Service: Cardiovascular;  Laterality: Bilateral;   SALIVARY GLAND SURGERY     TONSILLECTOMY     4 yoa   TOTAL KNEE ARTHROPLASTY Left 12/24/2015   Procedure: LEFT TOTAL KNEE ARTHROPLASTY;  Surgeon: Glendia Cordella Hutchinson, MD;  Location: [facility] OR;  Service: Orthopedics;  Laterality: Left;   TOTAL KNEE ARTHROPLASTY Right 12/30/2017   Procedure: RIGHT TOTAL KNEE ARTHROPLASTY;  Surgeon: Hutchinson Cordella Glendia, MD;  Location: [facility] OR;  Service: Orthopedics;  Laterality: Right;   WRIST FRACTURE SURGERY  11/2008     FAMILY HISTORY  Family History  Problem Relation Age of Onset   Hyperlipidemia Mother    Arthritis Mother    Diabetes Mother    Heart disease Mother    Colon polyps Mother    Cancer Paternal Grandmother        breast   Hypercholesterolemia Sister    Colon polyps Sister    Breast cancer Sister    Colon cancer Neg Hx    Esophageal cancer Neg Hx    Rectal cancer Neg Hx    Stomach cancer Neg Hx    Pancreatic cancer Neg Hx      SOCIAL HISTORY   Social History   Tobacco Use   Smoking status: Former    Average packs/day: 0.8 packs/day for 45.0 years (33.8 ttl pk-yrs)    Types: Cigarettes    Start date: 01/2023   Smokeless tobacco:  Never   Tobacco comments:    Does not on starting back smoking.  Vaping Use   Vaping status: Never Used  Substance Use Topics   Alcohol use: Yes    Alcohol/week: 0.0 standard drinks of alcohol    Comment: glass of wine twice a month    Drug use: No     MEDICATIONS    Home Medication:  Current Outpatient Rx   Order #: [id number] Class: Historical Med   Order #: [id number] Class: Normal   Order #: [id number] Class: Normal   Order #: [id number] Class: Historical Med   Order #: [id number] Class: Historical Med   Order #: [id number] Class: Normal   Order #: [id number] Class: Normal   Order #: [id number] Class: Historical Med   Order #: [id number] Class: Historical Med   Order #: [id number] Class: Historical Med   Order #: [id number] Class: Normal   Order #: [id number] Class: Historical Med   Order #: [id number] Class: Normal   Order #: [id number] Class: Historical Med   Order #: [id number] Class: Normal   Order #: [id number] Class: Normal   Order #: [id number] Class: Normal   Order #: [id number] Class: Normal   Order #: [id number] Class: Historical Med   Order #: [id number] Class: Normal   Order #: [id number] Class: Historical Med   Order #: [id number] Class: Historical Med   Order #: [id number] Class: Normal   Order #: [id number] Class: Historical Med   Order #: [id number] Class: Historical Med   Order #: [id number] Class: Normal   Order #: [id number] Class: Historical Med   Order #: [id number] Class: Historical Med   Order #: [id number] Class: Normal   Order #: [id number] Class: Normal   Order #: [id number] Class: Normal   Order #: [id number] Class: Historical Med   Reflex Order#: [id number] (Nmi#:[id number])Rojdd: Normal   Order #: [id number] Class: Historical Med    Current Medication:  Current Outpatient Medications:    acetaminophen  (TYLENOL ) 500 MG tablet, Take 1,000 mg by mouth every 8 (eight) hours as needed for mild pain (pain score 1-3) or moderate pain (pain score 4-6)., Disp: , Rfl:    albuterol  (VENTOLIN  HFA) 108 (90  Base) MCG/ACT inhaler, Inhale 2 puffs into the lungs every 4 (four) hours as needed for wheezing or shortness of breath., Disp: 3 each, Rfl: 2   amitriptyline  (ELAVIL ) 50 MG tablet, TAKE 1/2 (ONE-HALF) TABLET BY MOUTH AT BEDTIME, Disp: 45 tablet, Rfl: 1   Ascorbic Acid  (VITAMIN C ) 1000 MG tablet, Take 1,000 mg by mouth daily., Disp: , Rfl:    aspirin  EC 81 MG tablet, Take 81 mg by mouth daily., Disp: , Rfl:    budesonide  (PULMICORT ) 0.5 MG/2ML nebulizer solution, Take 2 mLs (0.5 mg total) by nebulization 2 (two) times daily., Disp: 360 mL, Rfl: 3  buPROPion  (WELLBUTRIN  XL) 150 MG 24 hr tablet, Take 1 tablet by mouth once daily, Disp: 90 tablet, Rfl: 1   calcium  carbonate (OS-CAL - DOSED IN MG OF ELEMENTAL CALCIUM ) 1250 (500 Ca) MG tablet, Take 1 tablet by mouth daily at 6 (six) AM., Disp: , Rfl:    calcium  carbonate (TUMS - DOSED IN MG ELEMENTAL CALCIUM ) 500 MG chewable tablet, Chew 2 tablets by mouth daily as needed for indigestion or heartburn., Disp: , Rfl:    Cholecalciferol  (VITAMIN D ) 1000 UNITS capsule, Take 2,000 Units by mouth daily., Disp: , Rfl:    clonazePAM  (KLONOPIN ) 0.5 MG tablet, Take 1 tablet by mouth twice daily as needed, Disp: 60 tablet, Rfl: 5   cyanocobalamin  (VITAMIN B12) 1000 MCG/ML injection, Inject into the muscle., Disp: , Rfl:    cyclobenzaprine  (FLEXERIL ) 10 MG tablet, Take 1 tablet by mouth three times daily as needed for muscle spasm, Disp: 60 tablet, Rfl: 3   dimenhyDRINATE (DRAMAMINE) 50 MG tablet, Take 50 mg by mouth every 8 (eight) hours as needed for nausea or dizziness., Disp: , Rfl:    donepezil  (ARICEPT ) 5 MG tablet, TAKE 1 TABLET BY MOUTH AT BEDTIME, Disp: 90 tablet, Rfl: 3   empagliflozin  (JARDIANCE ) 10 MG TABS tablet, Take 1 tablet (10 mg total) by mouth daily. (Patient not taking: Reported on 08/21/2024), Disp: 30 tablet, Rfl: 11   ezetimibe  (ZETIA ) 10 MG tablet, Take 1 tablet by mouth once daily, Disp: 90 tablet, Rfl: 3   FLUoxetine  (PROZAC ) 40 MG capsule,  Take 80 mg by mouth M, W, F and 40 mg by mouth Tues, Thurs, Sat and Sunday (Patient not taking: Reported on 08/21/2024), Disp: 102 capsule, Rfl: 1   fluticasone  (FLONASE) 50 MCG/ACT nasal spray, Place 2 sprays into both nostrils daily as needed for rhinitis., Disp: , Rfl:    furosemide  (LASIX ) 20 MG tablet, Take 1 tablet (20 mg total) by mouth daily as needed (for increased shortness of breath or weight gain greater than 3 pounds overnight.)., Disp: 45 tablet, Rfl: 3   gabapentin  (NEURONTIN ) 100 MG capsule, Take 100 mg by mouth., Disp: , Rfl:    guaiFENesin  (MUCINEX ) 600 MG 12 hr tablet, Take 600 mg by mouth 2 (two) times daily as needed for to loosen phlegm., Disp: , Rfl:    ipratropium-albuterol  (DUONEB) 0.5-2.5 (3) MG/3ML SOLN, Take 3 mLs by nebulization every 6 (six) hours as needed., Disp: 360 mL, Rfl: 3   lisinopril  (ZESTRIL ) 5 MG tablet, Take 5 mg by mouth daily., Disp: , Rfl:    magnesium  oxide (MAG-OX) 400 (240 Mg) MG tablet, Take 400 mg by mouth daily., Disp: , Rfl:    metoprolol  tartrate (LOPRESSOR ) 25 MG tablet, Take 1/2 (one-half) tablet by mouth twice daily, Disp: 90 tablet, Rfl: 3   Multiple Vitamin (MULTIVITAMIN) tablet, Take 1 tablet by mouth daily., Disp: , Rfl:    naproxen  sodium (ALEVE ) 220 MG tablet, Take 220 mg by mouth in the morning., Disp: , Rfl:    pantoprazole  (PROTONIX ) 40 MG tablet, TAKE 1 TABLET BY MOUTH ONCE DAILY 30  MINUTES  PRIOR  TO  BREAKFAST, Disp: 90 tablet, Rfl: 1   predniSONE  (DELTASONE ) 20 MG tablet, 2 tabs po for 4 days, then 1 tab po for 4 days, Disp: 12 tablet, Rfl: 0   rosuvastatin  (CRESTOR ) 40 MG tablet, Take 1 tablet by mouth once daily, Disp: 90 tablet, Rfl: 3   Simethicone  125 MG CAPS, Take 125 mg by mouth daily as needed (gas)., Disp: ,  Rfl:    spironolactone  (ALDACTONE ) 25 MG tablet, Take 0.5 tablets (12.5 mg total) by mouth daily., Disp: 45 tablet, Rfl: 3   vitamin E 180 MG (400 UNITS) capsule, Take 400 Units by mouth daily., Disp: , Rfl:      ALLERGIES   Honey bee venom [bee venom] and Diazepam   BP 120/70   Pulse 84   Temp 98.6 F (37 C)   Ht 5' 6 (1.676 m)   Wt 235 lb 6.4 oz (106.8 kg)   SpO2 96%   BMI 37.99 kg/m    Review of Systems: Gen:  Denies  fever, sweats, chills weight loss  HEENT: Denies blurred vision, double vision, ear pain, eye pain, hearing loss, nose bleeds, sore throat Cardiac:  No dizziness, chest pain or heaviness, chest tightness,edema, No JVD Resp:   No cough, -sputum production, +shortness of breath,-wheezing, -hemoptysis,  Other:  All other systems negative   Physical Examination:   General Appearance: No distress  EYES PERRLA, EOM intact.   NECK Supple, No JVD Pulmonary: normal breath sounds, No wheezing.  CardiovascularNormal S1,S2.  No m/r/g.   Abdomen: Benign, Soft, non-tender. Neurology UE/LE 5/5 strength, no focal deficits Ext pulses intact, cap refill intact ALL OTHER ROS ARE NEGATIVE       IMAGING    DG Cervical Spine Complete Result Date: 08/26/2024 CLINICAL DATA:  Fall, neck pain EXAM: CERVICAL SPINE - COMPLETE 4+ VIEW COMPARISON:  07/23/2022 FINDINGS: Stable alignment and prevertebral soft tissues. Remote anterior cervical discectomy and fusion spanning C4 through C7. Multilevel posterior facet arthropathy. Facets appear aligned. Limited assessment of the neural foramina. Intact odontoid. Trachea midline. Aorta atherosclerotic. Lung apices are clear. IMPRESSION: Stable postoperative and degenerative changes as above. No acute finding by plain radiography. Electronically Signed   By: CHRISTELLA.  Shick M.D.   On: 08/26/2024 16:52   DG Lumbar Spine Complete Result Date: 08/26/2024 CLINICAL DATA:  Fall, low back pain EXAM: LUMBAR SPINE - COMPLETE 4+ VIEW COMPARISON:  None Available. FINDINGS: Normal alignment without acute osseous finding or fracture. Preserved vertebral body heights. Multilevel degenerative changes throughout the entire visualized lower thoracic and lumbar  spine most severe at L4-5 and L5-S1 with marked disc space narrowing, vacuum disc phenomenon, sclerosis, and endplate bony spurring. Posterior facet arthropathy spanning L3-S1. Aorta atherosclerotic. No visualized pars defects. SI joints are maintained. IMPRESSION: Degenerative changes as above. No acute finding by plain radiography. Electronically Signed   By: CHRISTELLA.  Shick M.D.   On: 08/26/2024 16:49      ASSESSMENT/PLAN   73 year old pleasant white female seen today for underlying COPD with extensive smoking history in the setting of morbid obesity and deconditioned state with recent hospitalization for acute COPD exacerbation with a history of aortic valve replacement undergoing cardiac rehab  Assessment of shortness of breath Underlying small obstructive airways disease Continue nebulized therapy as prescribed Avoid Allergens and Irritants Avoid secondhand smoke Avoid SICK contacts Recommend  Masking  when appropriate Recommend Keep up-to-date with vaccinations Continue exercise as tolerated   Chronic Hypoxic resp failure due to COPD -Patient benefits from oxygen therapy 2L [place]  -recommend using oxygen as prescribed -patient needs this for survival Will start oxygen tonight   Patient with underlying mild OSA however at this she does not want to continue with CPAP due to claustrophobia and inability to tolerate the machine   Cardiology Follow-up with Dr. Darron Continue cardiac rehab  Obesity -recommend significant weight loss -recommend changing diet  Deconditioned state -Recommend increased daily activity  and exercise   MEDICATION ADJUSTMENTS/LABS AND TESTS ORDERED: Start oxygen therapy Revisit lung cancer screening program at the next office visit Continue nebulizers as prescribed Avoid Allergens and Irritants Avoid secondhand smoke Avoid SICK contacts Recommend  Masking  when appropriate Recommend Keep up-to-date with vaccinations  CURRENT MEDICATIONS REVIEWED AT  LENGTH WITH PATIENT TODAY   Patient  satisfied with Plan of action and management. All questions answered   Follow up 6 months   I spent a total of 43 minutes dedicated to the care of this patient on the date of this encounter to include pre-visit review of records, face-to-face time with the patient discussing conditions above, post visit ordering of testing, clinical documentation with the electronic health record, making appropriate referrals as documented, and communicating necessary information to the patient's healthcare team.    The Patient requires high complexity decision making for assessment and support, frequent evaluation and titration of therapies, application of advanced monitoring technologies and extensive interpretation of multiple databases.  Patient satisfied with Plan of action and management. All questions answered    Nickolas Alm Cellar, M.D.  [facility]  Medical Director [facility] Medical Director [facility] Cardio-Pulmonary Department

## 2024-09-11 ENCOUNTER — Encounter: Payer: Self-pay | Admitting: Family Medicine

## 2024-09-11 DIAGNOSIS — R519 Headache, unspecified: Secondary | ICD-10-CM

## 2024-09-11 MED ORDER — AMITRIPTYLINE HCL 50 MG PO TABS: 50.0000 mg | ORAL_TABLET | Freq: Every day | ORAL | 1 refills | Status: AC

## 2024-09-11 NOTE — Telephone Encounter (Signed)
 Okay to send in new Rx with dosing instructions to take 1 tab at bedtime?  Since it's a dose change, insurance should cover it now.

## 2024-09-25 ENCOUNTER — Ambulatory Visit

## 2024-09-25 DIAGNOSIS — M7062 Trochanteric bursitis, left hip: Secondary | ICD-10-CM | POA: Diagnosis not present

## 2024-09-25 DIAGNOSIS — M5442 Lumbago with sciatica, left side: Secondary | ICD-10-CM

## 2024-09-25 MED ORDER — TRIAMCINOLONE ACETONIDE 40 MG/ML IJ SUSP
40.0000 mg | INTRAMUSCULAR | Status: AC | PRN
  Administered 2024-09-25: 40 mg

## 2024-09-25 NOTE — Patient Instructions (Signed)
 [facility] [address], [place] [address] [phone]   Thank you for visiting the office today. We appreciate your trust and allowing us  to help you with your orthopedic needs.  Please do not hesitate to call if you have further questions or concerns following your visit with us . If you experience life-threatening symptoms or it cannot wait until normal office hours, please go to the nearest Emergency Department for immediate evaluation.      Cortisone Injection Patient Information  -A cortisone injection has been recommended for you today during your office visit. The injection consists of two medications administered at the same time. The first medication is a numbing medication. This medication will help you to feel better today for a couple hours.  However, this medication will wear off today and your discomfort may return. The second medication is the steroid medication. This medication will usually take a few days for full effect.   -Some individuals, about 1 in 20, can have an increase in their pain after the injection for approximately 24-36 hours. This is called a steroid flare and may be associated with some slight flushing of the face. The best thing to do if this occurs is to ice the area as much as possible and take ibuprofen if you are able.   -Any patients who are currently receiving treatment for problems with their blood sugar should be aware that this medication may cause you to have elevated blood sugar. This will typically occur for 24-36 hours after the injection. We recommend that you check your blood sugar regularly during this time and contact your primary care provider immediately or go to the nearest emergency room if the levels get to high.

## 2024-09-25 NOTE — Progress Notes (Signed)
 Orthopaedic Surgery Established Patient Visit   History of Present Illness: The patient is a 73 y.o. female seen in clinic for 4 to 6-week history of low back and left hip pain. She is previously known to the office in our [place] location- last seen in 2023. Patient states pain primarily on lateral aspect of left hip.  At the time of injury, patient was moving to sit down on a seat that was 2 to 3 feet off the ground, fell sideways, and landed on lateral aspect of left hip.  Severe initial pain.  States she developed edema and ecchymosis that has since resolved, however residual pain persists.  Exacerbated with ambulating and bending at the waist.  Patient has noticed significant symptom improvement with wearing over-the-counter copper waist belt.  Patient has taken over-the-counter Tylenol  and Aleve  with minimal improvement.  Typically sleeps on left side, however since injury has been unable to sleep on her left side.  Reports associated left lower extremity weakness, present since January 2025 after heart valve replacement via left groin entry.  Patient did complete cardiac rehab after cardiac surgery.  Patient with longstanding history of cervical degenerative disc disease with cervical radiculopathy status post ACDF, lumbar degenerative disc disease with lumbar radiculopathy status post lumbar microdiscectomy, and bilateral knee osteoarthritis with knee arthroplasty >10 years ago per Dr. Addie with [facility].  Patient last seen by Dr. Addie on 04/25/2022 with complaints of recurrent falls.  Patient managed by Dr. Loreli with [facility] neurology for neuropathy and left leg weakness.  EMG revealed no evidence of radiculopathy or pinched nerve in left lower extremity.  Neuropathy currently managed with gabapentin  and amitriptyline .  Patient's diabetes currently well-controlled.  Type II.  Non-insulin -dependent.  Last A1c 6.9 on 03/29/2024.   Patient seen by PCP, Dr. Jacques Copland  with [facility], on 08/21/2024 for evaluation of pain s/p fall. Cervical spine and lumbar spine xray ordered for evaluation of acute injury, none seen. Patient prescribed prednisone  40mg  8-day taper. Patient states initially provided mild symptom relief, however, pain now as severe as at start.   Past Medical, Social and Family History: Past Medical History:  Diagnosis Date   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 sting    Aortic stenosis    a. 2013: nl LV sys fxn, mild MR, no evidence of pulm htn; b. TTE 8/17: EF 60-65%, no RWMA, nl LV dia fxn,  mild AS, mod AI   CAD (coronary artery disease), native coronary artery 11/26/2020   Controlled type 2 diabetes mellitus with diabetic autonomic neuropathy, without long-term current use of insulin  (HCC) 08/10/2007   COPD (chronic obstructive pulmonary disease) (HCC) 10/30/2016   Depression    GAD (generalized anxiety disorder)    GERD (gastroesophageal reflux disease)    Hiatal hernia with gastroesophageal reflux 1997   Hyperlipidemia    Hypertension    Osteoporosis    S/P TAVR (transcatheter aortic valve replacement) 12/21/2023   s/p TAVR with a 34 mm Evolut FX via the TF approach by Dr. Wendel & Dr. Maryjane   Tobacco abuse    Past Surgical History:  Procedure Laterality Date   ABDOMINAL HYSTERECTOMY  1993   ANTERIOR CERVICAL DECOMP/DISCECTOMY FUSION N/A 10/26/2018   Procedure: ACDF C4-C5 C5-C6 C6-C7;  Surgeon: Onetha Kuba, MD;  Location: [facility] OR;  Service: Neurosurgery;  Laterality: N/A;   APPENDECTOMY     BACK SURGERY     BREAST BIOPSY Right 2007   benign   CHOLECYSTECTOMY  DILATION AND CURETTAGE OF UTERUS     ESOPHAGEAL DILATION     x 3   ESOPHAGOGASTRODUODENOSCOPY (EGD) WITH PROPOFOL  N/A 02/06/2022   Procedure: ESOPHAGOGASTRODUODENOSCOPY (EGD) WITH PROPOFOL ;  Surgeon: Therisa Bi, MD;  Location: [facility] ENDOSCOPY;  Service: Gastroenterology;  Laterality: N/A;   ESOPHAGOGASTRODUODENOSCOPY (EGD) WITH PROPOFOL  N/A  02/26/2022   Procedure: ESOPHAGOGASTRODUODENOSCOPY (EGD) WITH PROPOFOL ;  Surgeon: Therisa Bi, MD;  Location: [facility] ENDOSCOPY;  Service: Gastroenterology;  Laterality: N/A;  EGD+dilation per Dr. Therisa   INCISION AND DRAINAGE Right 12/02/2021   Procedure: INCISION AND DRAINAGE;  Surgeon: Kathlynn Sharper, MD;  Location: [facility] ORS;  Service: Orthopedics;  Laterality: Right;   INTRAOPERATIVE TRANSTHORACIC ECHOCARDIOGRAM N/A 12/21/2023   Procedure: INTRAOPERATIVE TRANSTHORACIC ECHOCARDIOGRAM;  Surgeon: Wendel Lurena POUR, MD;  Location: [facility] INVASIVE CV LAB;  Service: Cardiovascular;  Laterality: N/A;   LEFT HEART CATH AND CORONARY ANGIOGRAPHY N/A 11/11/2020   Procedure: LEFT HEART CATH AND CORONARY ANGIOGRAPHY;  Surgeon: Darron Deatrice LABOR, MD;  Location: [facility] INVASIVE CV LAB;  Service: Cardiovascular;  Laterality: N/A;   OOPHORECTOMY     OVARIAN CYST REMOVAL  1972   RIGHT/LEFT HEART CATH AND CORONARY ANGIOGRAPHY Bilateral 10/04/2023   Procedure: RIGHT/LEFT HEART CATH AND CORONARY ANGIOGRAPHY;  Surgeon: Darron Deatrice LABOR, MD;  Location: [facility] INVASIVE CV LAB;  Service: Cardiovascular;  Laterality: Bilateral;   SALIVARY GLAND SURGERY     TONSILLECTOMY     4 yoa   TOTAL KNEE ARTHROPLASTY Left 12/24/2015   Procedure: LEFT TOTAL KNEE ARTHROPLASTY;  Surgeon: Glendia Cordella Hutchinson, MD;  Location: [facility] OR;  Service: Orthopedics;  Laterality: Left;   TOTAL KNEE ARTHROPLASTY Right 12/30/2017   Procedure: RIGHT TOTAL KNEE ARTHROPLASTY;  Surgeon: Hutchinson Cordella Glendia, MD;  Location: [facility] OR;  Service: Orthopedics;  Laterality: Right;   WRIST FRACTURE SURGERY  11/2008   Allergies  Allergen Reactions   Honey Bee Venom [Bee Venom] Anaphylaxis   Diazepam Other (See Comments)    REACTION: pt states she gets very angry and abusive verbally on this medication    Current Outpatient Medications on File Prior to Visit  Medication Sig Dispense Refill   acetaminophen  (TYLENOL ) 500 MG tablet Take 1,000 mg by mouth every 8 (eight)  hours as needed for mild pain (pain score 1-3) or moderate pain (pain score 4-6).     albuterol  (VENTOLIN  HFA) 108 (90 Base) MCG/ACT inhaler Inhale 2 puffs into the lungs every 4 (four) hours as needed for wheezing or shortness of breath. 3 each 2   amitriptyline  (ELAVIL ) 50 MG tablet Take 1 tablet (50 mg total) by mouth at bedtime. 90 tablet 1   Ascorbic Acid  (VITAMIN C ) 1000 MG tablet Take 1,000 mg by mouth daily.     aspirin  EC 81 MG tablet Take 81 mg by mouth daily.     budesonide  (PULMICORT ) 0.5 MG/2ML nebulizer solution Take 2 mLs (0.5 mg total) by nebulization 2 (two) times daily. 360 mL 3   buPROPion  (WELLBUTRIN  XL) 150 MG 24 hr tablet Take 1 tablet by mouth once daily 90 tablet 1   calcium  carbonate (OS-CAL - DOSED IN MG OF ELEMENTAL CALCIUM ) 1250 (500 Ca) MG tablet Take 1 tablet by mouth daily at 6 (six) AM.     calcium  carbonate (TUMS - DOSED IN MG ELEMENTAL CALCIUM ) 500 MG chewable tablet Chew 2 tablets by mouth daily as needed for indigestion or heartburn.     Cholecalciferol  (VITAMIN D ) 1000 UNITS capsule Take 2,000 Units by mouth daily.     clonazePAM  (KLONOPIN )  0.5 MG tablet Take 1 tablet by mouth twice daily as needed 60 tablet 5   cyanocobalamin  (VITAMIN B12) 1000 MCG/ML injection Inject into the muscle.     cyclobenzaprine  (FLEXERIL ) 10 MG tablet Take 1 tablet by mouth three times daily as needed for muscle spasm 60 tablet 3   dimenhyDRINATE (DRAMAMINE) 50 MG tablet Take 50 mg by mouth every 8 (eight) hours as needed for nausea or dizziness.     donepezil  (ARICEPT ) 5 MG tablet TAKE 1 TABLET BY MOUTH AT BEDTIME 90 tablet 3   empagliflozin  (JARDIANCE ) 10 MG TABS tablet Take 1 tablet (10 mg total) by mouth daily. (Patient not taking: Reported on 08/21/2024) 30 tablet 11   ezetimibe  (ZETIA ) 10 MG tablet Take 1 tablet by mouth once daily 90 tablet 3   FLUoxetine  (PROZAC ) 40 MG capsule Take 80 mg by mouth M, W, F and 40 mg by mouth Tues, Thurs, Sat and Sunday (Patient not taking:  Reported on 08/21/2024) 102 capsule 1   fluticasone  (FLONASE) 50 MCG/ACT nasal spray Place 2 sprays into both nostrils daily as needed for rhinitis.     furosemide  (LASIX ) 20 MG tablet Take 1 tablet (20 mg total) by mouth daily as needed (for increased shortness of breath or weight gain greater than 3 pounds overnight.). 45 tablet 3   gabapentin  (NEURONTIN ) 100 MG capsule Take 100 mg by mouth.     guaiFENesin  (MUCINEX ) 600 MG 12 hr tablet Take 600 mg by mouth 2 (two) times daily as needed for to loosen phlegm.     ipratropium-albuterol  (DUONEB) 0.5-2.5 (3) MG/3ML SOLN Take 3 mLs by nebulization every 6 (six) hours as needed. 360 mL 3   lisinopril  (ZESTRIL ) 5 MG tablet Take 5 mg by mouth daily.     magnesium  oxide (MAG-OX) 400 (240 Mg) MG tablet Take 400 mg by mouth daily.     metoprolol  tartrate (LOPRESSOR ) 25 MG tablet Take 1/2 (one-half) tablet by mouth twice daily 90 tablet 3   Multiple Vitamin (MULTIVITAMIN) tablet Take 1 tablet by mouth daily.     naproxen  sodium (ALEVE ) 220 MG tablet Take 220 mg by mouth in the morning.     pantoprazole  (PROTONIX ) 40 MG tablet TAKE 1 TABLET BY MOUTH ONCE DAILY 30  MINUTES  PRIOR  TO  BREAKFAST 90 tablet 1   predniSONE  (DELTASONE ) 20 MG tablet 2 tabs po for 4 days, then 1 tab po for 4 days 12 tablet 0   rosuvastatin  (CRESTOR ) 40 MG tablet Take 1 tablet by mouth once daily 90 tablet 3   Simethicone  125 MG CAPS Take 125 mg by mouth daily as needed (gas).     spironolactone  (ALDACTONE ) 25 MG tablet Take 0.5 tablets (12.5 mg total) by mouth daily. 45 tablet 3   vitamin E 180 MG (400 UNITS) capsule Take 400 Units by mouth daily.     No current facility-administered medications on file prior to visit.   Social History   Tobacco Use   Smoking status: Former    Average packs/day: 0.8 packs/day for 45.0 years (33.8 ttl pk-yrs)    Types: Cigarettes    Start date: 01/2023   Smokeless tobacco: Never   Tobacco comments:    Does not on starting back smoking.   Vaping Use   Vaping status: Never Used  Substance Use Topics   Alcohol use: Yes    Alcohol/week: 0.0 standard drinks of alcohol    Comment: glass of wine twice a month    Drug use:  No      I have reviewed past medical, surgical, social and family history, medications and allergies as documented in the EMR.  Review of Systems - A ROS was performed including pertinent positives and negatives as documented in the HPI.     Physical Exam:  General/Constitutional: NAD Vascular: No edema, swelling or tenderness, except as noted in detailed exam Integumentary: No impressive skin lesions present, except as noted in detailed exam Neuro/Psych: Normal mood and affect, oriented to person, place and time Musculoskeletal: Normal, except as noted in detailed exam and in HPI   Focused examination:  Back normal to inspection without obvious deformity. Mild midline lower lumbar spine tenderness. Mild left sided SI joint and buttocks/piriformis tenderness with palpation. No paraspinal musculature tenderness or palpable muscle tightness/spasm. No pain with lumbar ROM.  4+/5 left hip flexion and 4+/5 left knee flexion and extension. Otherwise, 5/5 lower extremity motor strength bilaterally. Previous left knee anterior incision well healed.  Right straight leg raise negative. Left straight leg raise elicits left thigh, lateral aspect pain. Negative Stinchfield test.   Hip Examination (focused):   LEFT  ROM (degrees): Flexion-Extension 10-120   Abduction 30   Adduction 10    IR 40    ER 45   Palpation (pain): Anterior negative   Iliopsoas negative   Greater troch positive   ASIS negative   AIIS negative   Posterior positive   Adductors negative  Special Tests: FADIR negative   FABER negative   Log Roll negative   Ober's negative   External Snapping Not performed   Internal Snapping Not performed  Other: Hip flexion strength 4+/5   Hip adductor strength  5/5   Hip abductor strength  5/5    Vascular/Lymphatic: Left lower extremity warm and well perfused Neurologic: Sensation intact to light touch to Superficial peroneal/Deep peroneal/Tibial/Sural/Saphenous nerves     XR Lumbar Spine Imaging: X-rays of the lumbar spine including 5-views (AP, supine LMO, MLO, lateral, lateral-spot) obtained 08/21/2024 at [facility] were reviewed personally by me and with patient today.  Per my independent interpretation these images show diffuse degenerative changes, most severe at L4-L5 and L5-S1 with disc space narrowing, sclerosis, and endplate spurring (predominantly posterior aspect). No acute fracture. No significant spondylolisthesis. No significant changes compared to lumbar spine xray from 06/23/2021.    Radiology Read:  Lumbar Spine Xray - Complete 4+ View 08/21/2024  FINDINGS: Normal alignment without acute osseous finding or fracture. Preserved vertebral body heights. Multilevel degenerative changes throughout the entire visualized lower thoracic and lumbar spine most severe at L4-5 and L5-S1 with marked disc space narrowing, vacuum disc phenomenon, sclerosis, and endplate bony spurring. Posterior facet arthropathy spanning L3-S1. Aorta atherosclerotic. No visualized pars defects. SI joints are maintained.   IMPRESSION: Degenerative changes as above. No acute finding by plain radiography.  Nerve Conduction Study of Bilateral Lower Extremities 04/18/2024 - This is an abnormal electrodiagnostic study consistent with a generalized severe sensorimotor polyneuropathy.   Assessment:  Left trochanteric bursitis Lumbar degenerative disc disease with lumbar radiculopathy Peripheral neuropathy  Plan:  Large Joint Inj: L greater trochanter on 09/25/2024 5:44 PM Indications: pain Details: (21 G) 3.5 in needle, lateral approach Medications: 40 mg triamcinolone acetonide 40 MG/ML (Ropivicaine 0.5% 4 cc) Outcome: tolerated well, no immediate  complications Procedure, treatment alternatives, risks and benefits explained, specific risks discussed. Consent was given by the patient. Patient was prepped and draped in the usual sterile fashion.       Patient  was seen and examined in office today. We reviewed patient's history, examination, and imaging in detail. Based on information available for this encounter, patient with multifactorial low back pain, left lateral hip pain, left lower extremity pain, and neuropathy.  Patient 2 months status post fall from 2 to 3 foot height onto lateral aspect of left hip.  Suspect patient has subsequent left trochanteric bursitis that developed since the injury. We discussed the nature course of her suspected injury and treatment options today.  Offered patient corticosteroid injection for symptom relief for left greater trochanteric bursitis.  Patient consented.  Discussed risk versus benefits specifically possibility of infection and transient hyperglycemia.  Also discussed recommendation for management of lumbar degenerative disc disease, lumbar radiculopathy, left lower extremity weakness, imbalance/frequent falls -with course of physical therapy.  Patient agreed.  Referral placed for formal physical therapy.  Advised patient to return to clinic for further evaluation in 6 to 8 weeks.  Discussed possibility of referral to neurosurgery for further workup of her suspected lumbar spine-related pathology if symptoms fail to improve or worsen upon next visit.  Patient accompanied to today's appointment by daughter Lindajo.   Patient education material was provided.  All questions, concerns and comments were addressed to the best of my ability.  Follow-up: 6-8 weeks  Left hip greater trochanter injection procedure: The risks and benefits of a cortisone injection were discussed and the patient wishes to proceed.  The skin overlying the left hip greater trochanteric bursa was cleansed with alcohol swabs and  ChloraPrep.  The skin was flash cooled with Ethyl Chloride, and using sterile technique, a 21 gauge needle was immediately introduced into tissue overlying the bony prominence of the left greater trochanter.  After aspiration revealed no blood, the injectate which consisted of 1cc of 40 mg/mL of Kenalog and 4cc of 0.5% ropivacaine  easily flowed into the tissue above the greater trochanter.  The needle was withdrawn and pressure was applied for hemostasis.  A bandage was applied.  The patient tolerated the procedure well. Patient instructed to ice the area tonight if sore.  Cortisone Injections You have received a cortisone injection today. This injection consists of a numbing medication and cortisone.  There may be side effects after receiving the injection.   If you are diabetic: Your blood sugar may increase for up to 48 hours.  Check it more often than normal.   General reactions: A cortisone flare reaction. This generally occurs 6-8 hours after receiving the injection.  Ice the area and take Tylenol  for pain.  If the pain lasts longer than 48 hours call the office.   Some patients may experience flushing, increased heart rate, red face or increase in body temperature.  This is rare but can happen.   Over the counter Benadryl , if appropriate, often reduces these symptoms.  For a severe reaction contact your family doctor or go to the emergency room.  If you have any other questions, feel free to ask.    Arlyss GEANNIE Schneider, DO Orthopedic Surgery & Sports Medicine [place]   This document was dictated using Dragon voice recognition software. A reasonable attempt at proof reading has been made to minimize errors.

## 2024-09-26 ENCOUNTER — Other Ambulatory Visit: Payer: Self-pay

## 2024-09-26 DIAGNOSIS — Z952 Presence of prosthetic heart valve: Secondary | ICD-10-CM

## 2024-10-04 NOTE — Progress Notes (Deleted)
 Cardiology Office Note    Date:  10/04/2024   ID:  Amanda [name], DOB 03/15/1951, MRN [id number]  PCP:  Watt Mirza, MD  Cardiologist:  Deatrice Cage, MD  Electrophysiologist:  None   Chief Complaint: Follow up  History of Present Illness:   Amanda [name] is a 73 y.o. female with history of nonobstructive CAD by LHC most recently in 09/2023, moderate to severe aortic valve stenosis status post TAVR in 12/2023, diet-controlled diabetes, COPD, HTN, HLD, ongoing tobacco use, mild positional OSA, and mild cognitive impairment followed by neurology who presents for ***  Coronary CTA in 08/2018 showed a calcium  score of 521 with moderate nonobstructive disease involving the LAD and RCA that were not significant by ctFFR.  Echo in 08/2019 demonstrated an EF of 60 to 65% with moderate aortic stenosis with a mean gradient of 19 mmHg and a valve area of 1.28 cm.  Repeat echo in 11/2020 showed an EF of 60 to 65%, grade 1 diastolic dysfunction, and moderate aortic stenosis with a mean gradient of 22 mmHg and a valve area of 1.29 cm.  She was admitted to the hospital in 11/2020 with chest pain and worsening exertional dyspnea.  R/LHC showed mild nonobstructive CAD with RHC showing normal filling pressures, normal pulmonary pressure, and normal cardiac output.  Aortic stenosis was moderate with a mean gradient of 24 mmHg and a valve area of 1.44 cm.  Carotid artery ultrasound in 06/2021 showed 1 to 39% bilateral ICA stenoses with antegrade flow of the bilateral vertebral arteries and normal flow hemodynamics of the bilateral subclavian arteries.  She fell in 07/2021 leading to 2 broken ribs.  She was admitted in 11/2021 with right hand cellulitis/abscess due to an infected cat bite.  Echo during that admission demonstrated an EF of 65 to 70%, no regional wall motion abnormalities, mild LVH, normal LV diastolic function parameters, normal RV systolic function and ventricular cavity size, mild to  moderate aortic valve insufficiency, and moderate aortic valve stenosis with a mean gradient of 29.2 mmHg.  She was seen in 07/2022 and forgot to mention at her prior visit that she had 2 episodes of chest discomfort that improved with husband's SL NTG.  She also noted some lower extremity heaviness with ambulation.  Lexiscan  MPI on 07/16/2022 showed no evidence of ischemia or infarction with normal LVEF and was overall low risk.  ABI normal bilaterally in 08/2022.  Echo in 11/2022 showed an EF of 65 to 70%, no regional wall motion abnormalities, mild LVH, grade 1 diastolic dysfunction mild aortic insufficiency, moderate aortic stenosis with a mean gradient of 30 mmHg and a valve area of 1.53 cm, and an estimated right atrial pressure of 8 mmHg.   She underwent MRI of the brain in 01/2023 for evaluation of memory loss, ataxia falls, and gait disturbance.  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  She subsequently followed up with ENT and reported waxing and waning swelling in this area.  Follow-up ultrasound showed a 1.1 cm simple appearing cyst within the right parotid gland.   She was seen in the office in 08/2023 and reported prior dizziness and lightheadedness improved with gait stability physical therapy.  To follow-up on her aortic valve stenosis/insufficiency she  underwent echo on 09/20/2023, that showed an EF of 60 to 65%, no regional wall motion abnormalities, mild LVH, grade 1 diastolic dysfunction, normal RV systolic function and ventricular cavity size, mild mitral regurgitation, severe aortic stenosis with a mean gradient of 36 mmHg and a valve area of 0.7 cm, mild aortic insufficiency, and an estimated right atrial pressure of 8 mmHg.  When compared  to echo from 11/2022, her aortic stenosis has progressed with a prior mean gradient of 30 mmHg and a valve area of 1.53 cm.  She was seen in follow-up on 09/22/2023 and reported some increased shortness of breath with exertion that improved with rest.  Subsequent R/LHC on 10/04/2023 showed mild nonobstructive disease, moderate to severe aortic stenosis with a mean gradient of 32 mmHg and a valve area 1.1 cm, normal right atrial pressure, mildly elevated wedge pressure, minimal pulmonary hypertension, and normal cardiac output.  TAVR evaluation was recommended.   She was evaluated by the structural heart team in 10/2023 and underwent TAVR on 12/21/2023.  Postoperative echo showed preserved LV systolic function with normal functioning TAVR.  Subsequent Zio patch did not show any evidence of high-grade AV block.  PYP scan negative.  Most recent echo from 01/2024 showed an EF of 65 to 70%, no regional wall motion abnormalities, mild concentric LVH, grade 1 diastolic dysfunction, normal RV systolic function and ventricular cavity size, mildly dilated left atrium, trivial mitral regurgitation, and normal structure and function of TAVR with a mean gradient of 6 mmHg.   She was admitted to the hospital in 03/2024 with acute hypoxic respiratory failure secondary to COPD exacerbation and diastolic heart failure with admission also notable for demand ischemia in the setting of hypoxia upon presentation.  BNP 39.  She had symptomatic improvement with IV diuresis, nebulizers, and steroids.  She was discharged home on furosemide  40 mg daily.     She was seen in the office in 04/2024 and reported lightheadedness and dizziness with positional changes and increased fatigue.  Orthostatics were positive.  Furosemide  and spironolactone  were held at that time.  Echo in 04/2024 showed an EF of 60 to 65%, no regional wall motion abnormalities, mild LVH, grade 1 diastolic dysfunction, normal RV systolic function and ventricular cavity  size, and normal structure and function of aortic valve prosthesis.  She was last seen in the office in 06/2024 with improved dyspnea that was more noticeable and excessive heat.  Orthostasis improved.  Labs obtained at that time showed a normal BNP as outlined below.  It was recommended she take furosemide  20 mg daily as needed.  ***   Labs independently reviewed: 06/2024 - BUN 11, serum creatinine 0.86, potassium 4.1, BNP 21 04/2024 - Hgb 13.5, PLT 194 03/2024 - TC 141, TG 211, HDL 60, LDL 48, magnesium  2.4, A1c 6.9, LP(a) less than 8.4, albumin 4.1, AST/ALT normal 01/2023 - A1c 6.4, TSH normal   Past Medical History:  Diagnosis Date   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 sting    Aortic stenosis    a. 2013: nl LV sys fxn, mild MR, no evidence of pulm htn; b. TTE 8/17: EF 60-65%, no RWMA, nl LV dia fxn,  mild AS, mod AI   CAD (coronary artery disease), native coronary artery 11/26/2020   Controlled type 2 diabetes mellitus with diabetic autonomic neuropathy, without long-term current use of insulin  (HCC) 08/10/2007   COPD (chronic obstructive pulmonary disease) (HCC) 10/30/2016   Depression    GAD (generalized anxiety disorder)    GERD (gastroesophageal  reflux disease)    Hiatal hernia with gastroesophageal reflux 1997   Hyperlipidemia    Hypertension    Osteoporosis    S/P TAVR (transcatheter aortic valve replacement) 12/21/2023   s/p TAVR with a 34 mm Evolut FX via the TF approach by Dr. Wendel & Dr. Maryjane   Tobacco abuse     Past Surgical History:  Procedure Laterality Date   ABDOMINAL HYSTERECTOMY  1993   ANTERIOR CERVICAL DECOMP/DISCECTOMY FUSION N/A 10/26/2018   Procedure: ACDF C4-C5 C5-C6 C6-C7;  Surgeon: Onetha Kuba, MD;  Location: [facility] OR;  Service: Neurosurgery;  Laterality: N/A;   APPENDECTOMY     BACK SURGERY     BREAST BIOPSY Right 2007   benign   CHOLECYSTECTOMY     DILATION AND CURETTAGE OF UTERUS     ESOPHAGEAL DILATION     x 3   ESOPHAGOGASTRODUODENOSCOPY (EGD) WITH PROPOFOL   N/A 02/06/2022   Procedure: ESOPHAGOGASTRODUODENOSCOPY (EGD) WITH PROPOFOL ;  Surgeon: Therisa Bi, MD;  Location: [facility] ENDOSCOPY;  Service: Gastroenterology;  Laterality: N/A;   ESOPHAGOGASTRODUODENOSCOPY (EGD) WITH PROPOFOL  N/A 02/26/2022   Procedure: ESOPHAGOGASTRODUODENOSCOPY (EGD) WITH PROPOFOL ;  Surgeon: Therisa Bi, MD;  Location: [facility] ENDOSCOPY;  Service: Gastroenterology;  Laterality: N/A;  EGD+dilation per Dr. Therisa   INCISION AND DRAINAGE Right 12/02/2021   Procedure: INCISION AND DRAINAGE;  Surgeon: Kathlynn Sharper, MD;  Location: [facility] ORS;  Service: Orthopedics;  Laterality: Right;   INTRAOPERATIVE TRANSTHORACIC ECHOCARDIOGRAM N/A 12/21/2023   Procedure: INTRAOPERATIVE TRANSTHORACIC ECHOCARDIOGRAM;  Surgeon: Wendel Lurena POUR, MD;  Location: [facility] INVASIVE CV LAB;  Service: Cardiovascular;  Laterality: N/A;   LEFT HEART CATH AND CORONARY ANGIOGRAPHY N/A 11/11/2020   Procedure: LEFT HEART CATH AND CORONARY ANGIOGRAPHY;  Surgeon: Darron Deatrice LABOR, MD;  Location: [facility] INVASIVE CV LAB;  Service: Cardiovascular;  Laterality: N/A;   OOPHORECTOMY     OVARIAN CYST REMOVAL  1972   RIGHT/LEFT HEART CATH AND CORONARY ANGIOGRAPHY Bilateral 10/04/2023   Procedure: RIGHT/LEFT HEART CATH AND CORONARY ANGIOGRAPHY;  Surgeon: Darron Deatrice LABOR, MD;  Location: [facility] INVASIVE CV LAB;  Service: Cardiovascular;  Laterality: Bilateral;   SALIVARY GLAND SURGERY     TONSILLECTOMY     4 yoa   TOTAL KNEE ARTHROPLASTY Left 12/24/2015   Procedure: LEFT TOTAL KNEE ARTHROPLASTY;  Surgeon: Glendia Cordella Hutchinson, MD;  Location: [facility] OR;  Service: Orthopedics;  Laterality: Left;   TOTAL KNEE ARTHROPLASTY Right 12/30/2017   Procedure: RIGHT TOTAL KNEE ARTHROPLASTY;  Surgeon: Hutchinson Cordella Glendia, MD;  Location: [facility] OR;  Service: Orthopedics;  Laterality: Right;   WRIST FRACTURE SURGERY  11/2008    Current Medications: No outpatient medications have been marked as taking for the 10/06/24 encounter (Appointment) with Abigail Bernardino HERO, PA-C.    Allergies:   Honey bee venom [bee venom] and Diazepam   Social History   Socioeconomic History   Marital status: Married    Spouse name: Not on file   Number of children: 2   Years of education: Not on file   Highest education level: Some college, no degree  Occupational History   Occupation: Insurance Claims Handler: [employer]  Tobacco Use   Smoking status: Former    Average packs/day: 0.8 packs/day for 45.0 years (33.8 ttl pk-yrs)    Types: Cigarettes    Start date: 01/2023   Smokeless tobacco: Never   Tobacco comments:    Does not on starting back smoking.  Vaping Use   Vaping status: Never Used  Substance and  Sexual Activity   Alcohol use: Yes    Alcohol/week: 0.0 standard drinks of alcohol    Comment: glass of wine twice a month    Drug use: No   Sexual activity: Not Currently  Other Topics Concern   Not on file  Social History Narrative   Not on file   Social Drivers of Health   Financial Resource Strain: High Risk (02/21/2024)   Overall Financial Resource Strain (CARDIA)    Difficulty of Paying Living Expenses: Hard  Food Insecurity: No Food Insecurity (03/29/2024)   Hunger Vital Sign    Worried About Running Out of Food in the Last Year: Never true    Ran Out of Food in the Last Year: Never true  Recent Concern: Food Insecurity - Food Insecurity Present (02/21/2024)   Hunger Vital Sign    Worried About Running Out of Food in the Last Year: Sometimes true    Ran Out of Food in the Last Year: Never true  Transportation Needs: No Transportation Needs (03/29/2024)   PRAPARE - Administrator, Civil Service (Medical): No    Lack of Transportation (Non-Medical): No  Physical Activity: Unknown (02/21/2024)   Exercise Vital Sign    Days of Exercise per Week: 0 days    Minutes of Exercise per Session: Not on file  Stress: Stress Concern Present (02/21/2024)   [employer] of Occupational Health -  Occupational Stress Questionnaire    Feeling of Stress : Very much  Social Connections: Moderately Isolated (03/29/2024)   Social Connection and Isolation Panel    Frequency of Communication with Friends and Family: Twice a week    Frequency of Social Gatherings with Friends and Family: Once a week    Attends Religious Services: 1 to 4 times per year    Active Member of [employer] or Organizations: No    Attends Banker Meetings: Patient declined    Marital Status: Widowed     Family History:  The patient's family history includes Arthritis in her mother; Breast cancer in her sister; Cancer in her paternal grandmother; Colon polyps in her mother and sister; Diabetes in her mother; Heart disease in her mother; Hypercholesterolemia in her sister; Hyperlipidemia in her mother. There is no history of Colon cancer, Esophageal cancer, Rectal cancer, Stomach cancer, or Pancreatic cancer.  ROS:   12-point review of systems is negative unless otherwise noted in the HPI.   EKGs/Labs/Other Studies Reviewed:    Studies reviewed were summarized above. The additional studies were reviewed today:  2D echo 05/02/2024: 1. Left ventricular ejection fraction, by estimation, is 60 to 65%. Left  ventricular ejection fraction by 2D MOD biplane is 60.6 %. The left  ventricle has normal function. The left ventricle has no regional wall  motion abnormalities. There is mild left  ventricular hypertrophy. Left ventricular diastolic parameters are  consistent with Grade I diastolic dysfunction (impaired relaxation).   2. Right ventricular systolic function is normal. The right ventricular  size is normal.   3. The mitral valve is normal in structure. No evidence of mitral valve  regurgitation.   4. The aortic valve has been repaired/replaced. Aortic valve  regurgitation is not visualized. Aortic valve mean gradient measures 6.3  mmHg.  __________   2D echo 01/21/2024: 1. Left ventricular ejection  fraction, by estimation, is 65 to 70%. Left  ventricular ejection fraction by 3D volume is 60 %. The left ventricle has  normal function. The left ventricle has no regional wall  motion  abnormalities. There is mild concentric  left ventricular hypertrophy. Left ventricular diastolic parameters are  consistent with Grade I diastolic dysfunction (impaired relaxation). The  average left ventricular global longitudinal strain is -19.2 %. The global  longitudinal strain is normal.   2. Right ventricular systolic function is normal. The right ventricular  size is normal.   3. Left atrial size was mildly dilated.   4. The mitral valve is normal in structure. Trivial mitral valve  regurgitation. No evidence of mitral stenosis.   5. The aortic valve is normal in structure. Aortic valve regurgitation is  not visualized. No aortic stenosis is present. There is a 34 Medtronic  CoreValve-Evolut Pro prosthetic (TAVR) valve present in the aortic  position. Procedure Date: 12/21/2023.  Aortic valve area, by VTI measures 3.19 cm. Aortic valve mean gradient  measures 6.0 mmHg. Aortic valve Vmax measures 1.74 m/s.   6. The inferior vena cava is normal in size with greater than 50%  respiratory variability, suggesting right atrial pressure of 3 mmHg.  __________   Zio patch 12/2023: Patient had a min HR of 56 bpm, max HR of 129 bpm, and avg HR of 72 bpm. Predominant underlying rhythm was Sinus Rhythm. Bundle Branch Block/IVCD was present. 1 run of Supraventricular Tachycardia occurred lasting 4 beats with a max rate of 129 bpm (avg  107 bpm).  Rare PACs and rare PVCs. No significant bradycardia overall. __________   Myocardial PYP scan 01/14/2024:   Myocardial uptake was negative for radiotracer uptake. The visual grade of myocardial uptake relative to the ribs was Grade 0 (No myocardial uptake and normal bone uptake).   Findings are not suggestive (Grade 0) of cardiac ATTR amyloidosis.   Prior study not  available for comparison. __________   2D echo 12/22/2023: 1. Left ventricular ejection fraction, by estimation, is 70 to 75%. The  left ventricle has hyperdynamic function. The left ventricle has no  regional wall motion abnormalities. There is mild concentric left  ventricular hypertrophy. Left ventricular  diastolic parameters are consistent with Grade I diastolic dysfunction  (impaired relaxation).   2. Right ventricular systolic function is normal. The right ventricular  size is normal. Tricuspid regurgitation signal is inadequate for assessing  PA pressure.   3. The mitral valve is degenerative. Trivial mitral valve regurgitation.  Moderate mitral annular calcification.   4. The aortic valve has been repaired/replaced. Aortic valve  regurgitation is not visualized. There is a 34mm Medtronic  CoreValve-Evolut Pro prosthetic (TAVR) valve present in the aortic  position. Procedure Date: 12/21/23. Echo findings are consistent  with normal structure and function of the aortic valve prosthesis. Aortic  valve area, by VTI measures 3.10 cm. Aortic valve mean gradient measures  8.0 mmHg. Aortic valve Vmax measures 1.89 m/s.   5. The inferior vena cava is normal in size with greater than 50%  respiratory variability, suggesting right atrial pressure of 3 mmHg.  __________   Limited echo 12/21/2023: 1. Left ventricular ejection fraction, by estimation, is 60 to 65%. The  left ventricle has normal function. The left ventricle has no regional  wall motion abnormalities. Left ventricular diastolic parameters are  consistent with Grade I diastolic  dysfunction (impaired relaxation).   2. Right ventricular systolic function is normal. The right ventricular  size is normal.   3. The mitral valve is degenerative. No evidence of mitral valve  regurgitation. Moderate mitral annular calcification.   4. TAVR Pre procedure: mean gradient 30 mmHg. Peak gradient 45 mm  Hg. DVI  .34. AVA 1.44cm2. Mild  to moderate central AI      Post deployment: mean gradient 2 mmHg. Peak gradient 4 mm Hg DVI 0.65.  EOA 3.12cm2 with trivial PVL. Aortic valve regurgitation is mild to  moderate. There is a 34 mm Medtronic stented (TAVR) valve present in the  aortic position.   Comparison(s): Successful TAVR Placement.  __________   Carotid artery ultrasound 10/28/2023: Summary:  Right Carotid: Velocities in the right ICA are consistent with a 1-39% stenosis.   Left Carotid: Velocities in the left ICA are consistent with a 1-39% stenosis.   Vertebrals: Bilateral vertebral arteries demonstrate antegrade flow.  Subclavians: Right subclavian artery flow was disturbed. Normal flow hemodynamics were seen in the left subclavian artery.  __________  [facility] 10/04/2023: 1.  Mild nonobstructive coronary artery disease. 2.  Moderate to severe aortic stenosis with mean gradient of 32 mmHg and valve area of 1.1 cm. 3.  Right heart catheterization showed normal RA pressure, mildly elevated wedge pressure, minimal pulmonary hypertension and normal cardiac output.   Recommendations: Proceed with TAVR evaluation. __________   2D echo 09/20/2023: 1. Left ventricular ejection fraction, by estimation, is 60 to 65%. The  left ventricle has normal function. The left ventricle has no regional  wall motion abnormalities. There is mild left ventricular hypertrophy.  Left ventricular diastolic parameters  are consistent with Grade I diastolic dysfunction (impaired relaxation).   2. Right ventricular systolic function is normal. The right ventricular  size is normal.   3. The mitral valve is degenerative. Mild mitral valve regurgitation.   4. AV mean gradient , PG , DVI 0.24, AVA 0.7cm2. The aortic  valve is calcified. Aortic valve regurgitation is mild. Severe aortic  valve stenosis.   5. The inferior vena cava is dilated in size with >50% respiratory  variability, suggesting right atrial pressure of 8  mmHg.  __________   2D echo 11/11/2022: 1. Left ventricular ejection fraction, by estimation, is 65 to 70%. The  left ventricle has normal function. The left ventricle has no regional  wall motion abnormalities. There is mild left ventricular hypertrophy.  Left ventricular diastolic parameters  are consistent with Grade I diastolic dysfunction (impaired relaxation).   2. Right ventricular systolic function is normal. The right ventricular  size is normal.   3. The mitral valve is degenerative. No evidence of mitral valve  regurgitation.   4. Mean aortic valve gradient , peak gradient , Vmax 3.61m/s,  DVI 0.37.SABRA The aortic valve is calcified. Aortic valve regurgitation is  mild. Moderate aortic valve stenosis. Aortic valve area, by VTI measures  1.53 cm.   5. The inferior vena cava is dilated in size with >50% respiratory  variability, suggesting right atrial pressure of 8 mmHg.  ___________   2D echo 12/02/2021: 1. Left ventricular ejection fraction, by estimation, is 65 to 70%. The  left ventricle has normal function. The left ventricle has no regional  wall motion abnormalities. There is mild left ventricular hypertrophy.  Left ventricular diastolic parameters  were normal.   2. Right ventricular systolic function is normal. The right ventricular  size is normal.   3. The mitral valve is degenerative. No evidence of mitral valve  regurgitation.   4. The aortic valve is calcified. Aortic valve regurgitation is mild to  moderate. Moderate aortic valve stenosis. Aortic valve mean gradient  measures 29.2 mmHg. Aortic valve Vmax measures 3.35 m/s.   5. The inferior vena cava is normal in size with  greater than 50%  respiratory variability, suggesting right atrial pressure of 3 mmHg __________   Carotid artery ultrasound 07/04/2021: Summary:  Right Carotid: Velocities in the right ICA are consistent with a 1-39%  stenosis. Non-hemodynamically significant plaque <50%  noted in the  CCA. The ECA appears <50% stenosed.   Left Carotid: Velocities in the left ICA are consistent with a 1-39%  stenosis. Non-hemodynamically significant plaque <50% noted in the CCA. The ECA appears <50% stenosed.   Vertebrals:  Bilateral vertebral arteries demonstrate antegrade flow.  Subclavians: Normal flow hemodynamics were seen in bilateral subclavian arteries. __________   LHC 11/11/2020: 1.  Mild nonobstructive coronary artery disease.  Tortuous vessels overall suggestive of chronic hypertension. 2.  Left ventricular angiography was not performed.  EF was normal by echo. 3.  Right heart catheterization showed normal filling pressures, normal pulmonary pressure and normal cardiac output. 4.  Moderate aortic stenosis with mean gradient of 24 mmHg and valve area of 1.44 cm.   Recommendations: No culprit is identified for the patient's chest pain.  Recommend medical therapy for nonobstructive coronary artery disease. Continue to monitor aortic stenosis with echocardiograms. The patient can be discharged home from a cardiac standpoint.  No other issues. __________   2D echo 12/2/201.  1. Left ventricular ejection fraction, by estimation, is 60 to 65%. The  left ventricle has normal function. The left ventricle has no regional  wall motion abnormalities. There is mild to moderate left ventricular  hypertrophy. Left ventricular diastolic  parameters are consistent with Grade I diastolic dysfunction (impaired  relaxation). The average left ventricular global longitudinal strain is  -15.3 %. The global longitudinal strain is abnormal.   2. Right ventricular systolic function is normal. The right ventricular  size is normal.   3. Left atrial size was mildly dilated.   4. The aortic valve is moderately calcified. Aortic valve regurgitation  is mild. Moderate aortic valve stenosis. Aortic valve area, by VTI  measures 1.29 cm. Aortic valve mean gradient measures 22.2 mmHg.  Aortic  valve Vmax measures 3.34 m/s. __________   2D echo 08/23/2019: 1. The left ventricle has normal systolic function with an ejection  fraction of 60-65%. The cavity size was normal. There is mild concentric  left ventricular hypertrophy. Left ventricular diastolic Doppler  parameters are consistent with impaired  relaxation.   2. The right ventricle has normal systolic function. The cavity was  normal. There is No increase in right ventricular wall thickness.   3. Left atrial size was normal.   4. Right atrial size was normal.   5. The mitral valve is abnormal. mild calcification of the mitral valve  leaflet.   6. The aortic valve is abnormal. Moderate sclerosis of the aortic valve.  Aortic valve regurgitation is mild by color flow Doppler. Mild-moderate  stenosis of the aortic valve. DVI 0.36   7. The aorta is normal unless otherwise noted.   8. The pulmonic valve was not well visualized. Pulmonic valve  regurgitation was not assessed by color flow Doppler. __________   Coronary CTA 10/05/2018: FINDINGS: Non-cardiac: See separate report from [facility]. No significant findings on limited lung and soft tissue windows.   Calcium  score: Dense calcium  noted in LAD and RCA   Coronary Arteries: Right dominant with no anomalies   LM: Distal LM less than 50% calcified stenosis   LAD: Ostial and mid LAD less than 50% calcific stenosis   D1: Small vessel less than 30% mixed plaque   D2: Large  branching vessel 50% calcific plaque proximally   Circumflex: Less than 50% calcified plaque at ostium   OM1: Small branch without significant disease   RCA: 50% or less calcific plaque in proximal mid and distal vessel   PDA: Less than 30% calcific plaque   PLA: Less than 30% calcific plaque   IMPRESSION: 1. Calcific aortic atherosclerosis moderate Normal aortic root 2.9 cm 2.  Calcified Aortic Valve suggest TTE correlation for AS 3.  Calcium  score 521 which is 94 th  percentile for age and sex 4. Moderate non obstructive appearing disease primarily in RCA and LAD as well as D2 Study will be sent for FFR CT __________   2D echo 08/17/2018: - Left ventricle: The cavity size was at the upper limits of    normal. Wall thickness was increased in a pattern of mild LVH.    Systolic function was normal. The estimated ejection fraction was    in the range of 60% to 65%. Wall motion was normal; there were no    regional wall motion abnormalities. Doppler parameters are    consistent with abnormal left ventricular relaxation (grade 1    diastolic dysfunction).  - Aortic valve: Transvalvular velocity was increased. There was    mild to moderate stenosis. There was mild to moderate    regurgitation. Mean gradient (S): 19 mm Hg. Valve area (VTI):    1.28 cm^2.  - Mitral valve: Calcified annulus.  - Left atrium: The atrium was mildly dilated.  - Right ventricle: The cavity size was normal. Systolic function    was normal. ___________   2D echo 08/05/2016: - Left ventricle: The cavity size was normal. There was mild focal    basal hypertrophy of the septum. Systolic function was normal.    The estimated ejection fraction was in the range of 60% to 65%.    Wall motion was normal; there were no regional wall motion    abnormalities. Left ventricular diastolic function parameters    were normal for the patient&'s age.  - Aortic valve: Calcified annulus. Probably trileaflet. There was    mild stenosis, based on peak velocity and mean gradient. There    was moderate regurgitation. __________   2D echo 03/17/2012: - Left ventricle: The cavity size was normal. There was mild    to moderate concentric hypertrophy. Systolic function was    normal. The estimated ejection fraction was in the range    of 55% to 60%. Wall motion was normal; there were no    regional wall motion abnormalities. Doppler parameters are    consistent with abnormal left ventricular relaxation     (grade 1 diastolic dysfunction).  - Aortic valve: Mild regurgitation.  - Mitral valve: Mild regurgitation.  - Right ventricle: Systolic function was normal.  - Pulmonary arteries: Systolic pressure was within the    normal range.   EKG:  EKG is ordered today.  The EKG ordered today demonstrates ***  Recent Labs: 03/29/2024: ALT 14 03/31/2024: Magnesium  2.4 04/28/2024: Hemoglobin 13.5; Platelets 194 07/05/2024: BNP 21.8; BUN 11; Creatinine, Ser 0.86; Potassium 4.1; Sodium 136  Recent Lipid Panel    Component Value Date/Time   CHOL 141 04/05/2024 1625   TRIG 211 (H) 04/05/2024 1625   HDL 60 04/05/2024 1625   CHOLHDL 2.4 04/05/2024 1625   CHOLHDL 2 01/28/2023 1158   VLDL 18.8 01/28/2023 1158   LDLCALC 48 04/05/2024 1625   LDLDIRECT 209.2 05/28/2009 0909    PHYSICAL EXAM:    VS:  There were no vitals taken for this visit.  BMI: There is no height or weight on file to calculate BMI.  Physical Exam  Wt Readings from Last 3 Encounters:  08/30/24 235 lb 6.4 oz (106.8 kg)  08/21/24 236 lb (107 kg)  07/05/24 230 lb 12.8 oz (104.7 kg)     ASSESSMENT & PLAN:   Nonobstructive CAD:  Diastolic dysfunction:  Moderate to severe aortic stenosis: Status post TAVR.  Normal functioning valvular prosthesis on echo in 04/2024.  Scheduled for follow-up echo in 12/2024 through the structural heart clinic.  ***  HLD: LDL 48 in 03/2024.  Carotid artery stenosis: Imaging in 10/2023 showed 1 to 39% bilateral carotid artery stenosis that was stable dating back to 2022.  ***  COPD:   {Are you ordering a CV Procedure (e.g. stress test, cath, DCCV, TEE, etc)?   Press F2        :[id number]}     Disposition: F/u with Dr. Darron or an APP in ***.   Medication Adjustments/Labs and Tests Ordered: Current medicines are reviewed at length with the patient today.  Concerns regarding medicines are outlined above. Medication changes, Labs and Tests ordered today are summarized above and listed in the  Patient Instructions accessible in Encounters.   Signed, Bernardino Bring, PA-C 10/04/2024 8:42 AM     [facility] [place] [address] [place], [place] [address] [phone]

## 2024-10-06 ENCOUNTER — Ambulatory Visit: Admitting: Physician Assistant

## 2024-10-06 DIAGNOSIS — I6523 Occlusion and stenosis of bilateral carotid arteries: Secondary | ICD-10-CM

## 2024-10-06 DIAGNOSIS — Z952 Presence of prosthetic heart valve: Secondary | ICD-10-CM

## 2024-10-06 DIAGNOSIS — J449 Chronic obstructive pulmonary disease, unspecified: Secondary | ICD-10-CM

## 2024-10-06 DIAGNOSIS — I5189 Other ill-defined heart diseases: Secondary | ICD-10-CM

## 2024-10-06 DIAGNOSIS — I2510 Atherosclerotic heart disease of native coronary artery without angina pectoris: Secondary | ICD-10-CM

## 2024-10-06 DIAGNOSIS — E785 Hyperlipidemia, unspecified: Secondary | ICD-10-CM

## 2024-10-06 DIAGNOSIS — I350 Nonrheumatic aortic (valve) stenosis: Secondary | ICD-10-CM

## 2024-10-09 ENCOUNTER — Other Ambulatory Visit: Payer: Self-pay | Admitting: Family Medicine

## 2024-10-09 ENCOUNTER — Encounter: Payer: Self-pay | Admitting: Radiology

## 2024-10-09 DIAGNOSIS — M5459 Other low back pain: Secondary | ICD-10-CM | POA: Diagnosis not present

## 2024-10-09 DIAGNOSIS — M25552 Pain in left hip: Secondary | ICD-10-CM | POA: Diagnosis not present

## 2024-10-09 DIAGNOSIS — Z1231 Encounter for screening mammogram for malignant neoplasm of breast: Secondary | ICD-10-CM

## 2024-10-09 DIAGNOSIS — M6281 Muscle weakness (generalized): Secondary | ICD-10-CM | POA: Diagnosis not present

## 2024-10-11 NOTE — Progress Notes (Unsigned)
 Cardiology Office Note    Date:  10/13/2024   ID:  Amanda [name], DOB Nov 18, 1951, MRN [id number]  PCP:  Watt Mirza, MD  Cardiologist:  Deatrice Cage, MD  Electrophysiologist:  None   Chief Complaint: Follow up  History of Present Illness:   Amanda [name] is a 73 y.o. female with history of nonobstructive CAD by LHC most recently in 09/2023, moderate to severe aortic valve stenosis status post TAVR in 12/2023, diet-controlled diabetes, COPD, HTN, HLD, ongoing tobacco use, mild positional OSA, and mild cognitive impairment followed by neurology who presents for follow-up of CAD.  Coronary CTA in 08/2018 showed a calcium  score of 521 with moderate nonobstructive disease involving the LAD and RCA that were not significant by ctFFR.  Echo in 08/2019 demonstrated an EF of 60 to 65% with moderate aortic stenosis with a mean gradient of 19 mmHg and a valve area of 1.28 cm.  Repeat echo in 11/2020 showed an EF of 60 to 65%, grade 1 diastolic dysfunction, and moderate aortic stenosis with a mean gradient of 22 mmHg and a valve area of 1.29 cm.  She was admitted to the hospital in 11/2020 with chest pain and worsening exertional dyspnea.  R/LHC showed mild nonobstructive CAD with RHC showing normal filling pressures, normal pulmonary pressure, and normal cardiac output.  Aortic stenosis was moderate with a mean gradient of 24 mmHg and a valve area of 1.44 cm.  Carotid artery ultrasound in 06/2021 showed 1 to 39% bilateral ICA stenoses with antegrade flow of the bilateral vertebral arteries and normal flow hemodynamics of the bilateral subclavian arteries.  She fell in 07/2021 leading to 2 broken ribs.  She was admitted in 11/2021 with right hand cellulitis/abscess due to an infected cat bite.  Echo during that admission demonstrated an EF of 65 to 70%, no regional wall motion abnormalities, mild LVH, normal LV diastolic function parameters, normal RV systolic function and ventricular cavity size,  mild to moderate aortic valve insufficiency, and moderate aortic valve stenosis with a mean gradient of 29.2 mmHg.  She was seen in 07/2022 and forgot to mention at her prior visit that she had 2 episodes of chest discomfort that improved with husband's SL NTG.  She also noted some lower extremity heaviness with ambulation.  Lexiscan  MPI on 07/16/2022 showed no evidence of ischemia or infarction with normal LVEF and was overall low risk.  ABI normal bilaterally in 08/2022.  Echo in 11/2022 showed an EF of 65 to 70%, no regional wall motion abnormalities, mild LVH, grade 1 diastolic dysfunction mild aortic insufficiency, moderate aortic stenosis with a mean gradient of 30 mmHg and a valve area of 1.53 cm, and an estimated right atrial pressure of 8 mmHg.   She underwent MRI of the brain in 01/2023 for evaluation of memory loss, ataxia falls, and gait disturbance.  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  She subsequently followed up with ENT and reported waxing and waning swelling in this area.  Follow-up ultrasound showed a 1.1 cm simple appearing cyst within the right parotid gland.   She was seen in the office in 08/2023 and reported prior dizziness and lightheadedness improved with gait stability physical therapy.  To follow-up on her aortic valve  stenosis/insufficiency she underwent echo on 09/20/2023, that showed an EF of 60 to 65%, no regional wall motion abnormalities, mild LVH, grade 1 diastolic dysfunction, normal RV systolic function and ventricular cavity size, mild mitral regurgitation, severe aortic stenosis with a mean gradient of 36 mmHg and a valve area of 0.7 cm, mild aortic insufficiency, and an estimated right atrial pressure of 8 mmHg.  When  compared to echo from 11/2022, her aortic stenosis has progressed with a prior mean gradient of 30 mmHg and a valve area of 1.53 cm.  She was seen in follow-up on 09/22/2023 and reported some increased shortness of breath with exertion that improved with rest.  Subsequent R/LHC on 10/04/2023 showed mild nonobstructive disease, moderate to severe aortic stenosis with a mean gradient of 32 mmHg and a valve area 1.1 cm, normal right atrial pressure, mildly elevated wedge pressure, minimal pulmonary hypertension, and normal cardiac output.  TAVR evaluation was recommended.   She was evaluated by the structural heart team in 10/2023 and underwent TAVR on 12/21/2023.  Postoperative echo showed preserved LV systolic function with normal functioning TAVR.  Subsequent Zio patch did not show any evidence of high-grade AV block.  PYP scan negative.  Most recent echo from 01/2024 showed an EF of 65 to 70%, no regional wall motion abnormalities, mild concentric LVH, grade 1 diastolic dysfunction, normal RV systolic function and ventricular cavity size, mildly dilated left atrium, trivial mitral regurgitation, and normal structure and function of TAVR with a mean gradient of 6 mmHg.   She was admitted to the hospital in 03/2024 with acute hypoxic respiratory failure secondary to COPD exacerbation and diastolic heart failure with admission also notable for demand ischemia in the setting of hypoxia upon presentation.  BNP 39.  She had symptomatic improvement with IV diuresis, nebulizers, and steroids.  She was discharged home on furosemide  40 mg daily.     She was seen in the office in 04/2024 and reported lightheadedness and dizziness with positional changes and increased fatigue.  Orthostatics were positive.  Furosemide  and spironolactone  were held at that time.  Echo in 04/2024 showed an EF of 60 to 65%, no regional wall motion abnormalities, mild LVH, grade 1 diastolic dysfunction, normal RV systolic function and ventricular  cavity size, and normal structure and function of aortic valve prosthesis.  She was last seen in the office in 06/2024 with improved dyspnea that was more noticeable and excessive heat.  Orthostasis improved.  Labs obtained at that time showed a normal BNP as outlined below.  It was recommended she take furosemide  20 mg daily as needed.  She comes in doing well from a cardiac perspective and is without symptoms of angina or cardiac decompensation.  Functional status has been somewhat limited by trochanteric bursitis requiring recent injection.  She indicates this occurred in the setting of trying to sit down on a roller and missing this falling onto her left hip.  In this setting she is now working with physical therapy 2 days/week as well as working out at a gym 3 days/week with improvement in functional status.  Not taking as needed furosemide  on a daily basis.  Not currently weighing herself at home with a weight that is up 2 pounds today when compared to her visit in 06/2024.  She attributes this to recently being more sedentary until diagnosis of trochanteric bursitis.  Now activity level is improving.  No progressive orthopnea or lower extremity swelling.  No symptoms concerning for bleeding.  No dizziness,  presyncope, or syncope.   Labs independently reviewed: 06/2024 - BUN 11, serum creatinine 0.86, potassium 4.1, BNP 21 04/2024 - Hgb 13.5, PLT 194 03/2024 - TC 141, TG 211, HDL 60, LDL 48, magnesium  2.4, A1c 6.9, LP(a) less than 8.4, albumin 4.1, AST/ALT normal 01/2023 - A1c 6.4, TSH normal   Past Medical History:  Diagnosis Date   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 sting    Aortic stenosis    a. 2013: nl LV sys fxn, mild MR, no evidence of pulm htn; b. TTE 8/17: EF 60-65%, no RWMA, nl LV dia fxn,  mild AS, mod AI   CAD (coronary artery disease), native coronary artery 11/26/2020   Controlled type 2 diabetes mellitus with diabetic autonomic neuropathy, without long-term current use of insulin  (HCC) 08/10/2007    COPD (chronic obstructive pulmonary disease) (HCC) 10/30/2016   Depression    GAD (generalized anxiety disorder)    GERD (gastroesophageal reflux disease)    Hiatal hernia with gastroesophageal reflux 1997   Hyperlipidemia    Hypertension    Osteoporosis    S/P TAVR (transcatheter aortic valve replacement) 12/21/2023   s/p TAVR with a 34 mm Evolut FX via the TF approach by Dr. Wendel & Dr. Maryjane   Tobacco abuse     Past Surgical History:  Procedure Laterality Date   ABDOMINAL HYSTERECTOMY  1993   ANTERIOR CERVICAL DECOMP/DISCECTOMY FUSION N/A 10/26/2018   Procedure: ACDF C4-C5 C5-C6 C6-C7;  Surgeon: Onetha Kuba, MD;  Location: [facility] OR;  Service: Neurosurgery;  Laterality: N/A;   APPENDECTOMY     BACK SURGERY     BREAST BIOPSY Right 2007   benign   CHOLECYSTECTOMY     DILATION AND CURETTAGE OF UTERUS     ESOPHAGEAL DILATION     x 3   ESOPHAGOGASTRODUODENOSCOPY (EGD) WITH PROPOFOL  N/A 02/06/2022   Procedure: ESOPHAGOGASTRODUODENOSCOPY (EGD) WITH PROPOFOL ;  Surgeon: Therisa Bi, MD;  Location: [facility] ENDOSCOPY;  Service: Gastroenterology;  Laterality: N/A;   ESOPHAGOGASTRODUODENOSCOPY (EGD) WITH PROPOFOL  N/A 02/26/2022   Procedure: ESOPHAGOGASTRODUODENOSCOPY (EGD) WITH PROPOFOL ;  Surgeon: Therisa Bi, MD;  Location: [facility] ENDOSCOPY;  Service: Gastroenterology;  Laterality: N/A;  EGD+dilation per Dr. Therisa   INCISION AND DRAINAGE Right 12/02/2021   Procedure: INCISION AND DRAINAGE;  Surgeon: Kathlynn Sharper, MD;  Location: [facility] ORS;  Service: Orthopedics;  Laterality: Right;   INTRAOPERATIVE TRANSTHORACIC ECHOCARDIOGRAM N/A 12/21/2023   Procedure: INTRAOPERATIVE TRANSTHORACIC ECHOCARDIOGRAM;  Surgeon: Wendel Lurena POUR, MD;  Location: [facility] INVASIVE CV LAB;  Service: Cardiovascular;  Laterality: N/A;   LEFT HEART CATH AND CORONARY ANGIOGRAPHY N/A 11/11/2020   Procedure: LEFT HEART CATH AND CORONARY ANGIOGRAPHY;  Surgeon: Darron Deatrice LABOR, MD;  Location: [facility] INVASIVE CV LAB;  Service:  Cardiovascular;  Laterality: N/A;   OOPHORECTOMY     OVARIAN CYST REMOVAL  1972   RIGHT/LEFT HEART CATH AND CORONARY ANGIOGRAPHY Bilateral 10/04/2023   Procedure: RIGHT/LEFT HEART CATH AND CORONARY ANGIOGRAPHY;  Surgeon: Darron Deatrice LABOR, MD;  Location: [facility] INVASIVE CV LAB;  Service: Cardiovascular;  Laterality: Bilateral;   SALIVARY GLAND SURGERY     TONSILLECTOMY     4 yoa   TOTAL KNEE ARTHROPLASTY Left 12/24/2015   Procedure: LEFT TOTAL KNEE ARTHROPLASTY;  Surgeon: Glendia Cordella Hutchinson, MD;  Location: [facility] OR;  Service: Orthopedics;  Laterality: Left;   TOTAL KNEE ARTHROPLASTY Right 12/30/2017   Procedure: RIGHT TOTAL KNEE ARTHROPLASTY;  Surgeon: Hutchinson Cordella Glendia, MD;  Location: [facility] OR;  Service: Orthopedics;  Laterality: Right;   WRIST FRACTURE SURGERY  11/2008  Current Medications: Current Meds  Medication Sig   acetaminophen  (TYLENOL ) 500 MG tablet Take 1,000 mg by mouth every 8 (eight) hours as needed for mild pain (pain score 1-3) or moderate pain (pain score 4-6).   albuterol  (VENTOLIN  HFA) 108 (90 Base) MCG/ACT inhaler Inhale 2 puffs into the lungs every 4 (four) hours as needed for wheezing or shortness of breath.   amitriptyline  (ELAVIL ) 50 MG tablet Take 1 tablet (50 mg total) by mouth at bedtime.   Ascorbic Acid  (VITAMIN C ) 1000 MG tablet Take 1,000 mg by mouth daily.   aspirin  EC 81 MG tablet Take 81 mg by mouth daily.   budesonide  (PULMICORT ) 0.5 MG/2ML nebulizer solution Take 2 mLs (0.5 mg total) by nebulization 2 (two) times daily.   buPROPion  (WELLBUTRIN  XL) 150 MG 24 hr tablet Take 1 tablet by mouth once daily   calcium  carbonate (OS-CAL - DOSED IN MG OF ELEMENTAL CALCIUM ) 1250 (500 Ca) MG tablet Take 1 tablet by mouth daily at 6 (six) AM.   calcium  carbonate (TUMS - DOSED IN MG ELEMENTAL CALCIUM ) 500 MG chewable tablet Chew 2 tablets by mouth daily as needed for indigestion or heartburn.   Cholecalciferol  (VITAMIN D ) 1000 UNITS capsule Take 2,000 Units by mouth  daily.   clonazePAM  (KLONOPIN ) 0.5 MG tablet Take 1 tablet by mouth twice daily as needed   cyanocobalamin  (VITAMIN B12) 1000 MCG/ML injection Inject into the muscle.   cyclobenzaprine  (FLEXERIL ) 10 MG tablet Take 1 tablet by mouth three times daily as needed for muscle spasm   dimenhyDRINATE (DRAMAMINE) 50 MG tablet Take 50 mg by mouth every 8 (eight) hours as needed for nausea or dizziness.   donepezil  (ARICEPT ) 5 MG tablet TAKE 1 TABLET BY MOUTH AT BEDTIME   ezetimibe  (ZETIA ) 10 MG tablet Take 1 tablet by mouth once daily   fluticasone  (FLONASE) 50 MCG/ACT nasal spray Place 2 sprays into both nostrils daily as needed for rhinitis.   furosemide  (LASIX ) 20 MG tablet Take 1 tablet (20 mg total) by mouth daily as needed (for increased shortness of breath or weight gain greater than 3 pounds overnight.).   gabapentin  (NEURONTIN ) 100 MG capsule Take 100 mg by mouth.   guaiFENesin  (MUCINEX ) 600 MG 12 hr tablet Take 600 mg by mouth 2 (two) times daily as needed for to loosen phlegm.   ipratropium-albuterol  (DUONEB) 0.5-2.5 (3) MG/3ML SOLN Take 3 mLs by nebulization every 6 (six) hours as needed.   lisinopril  (ZESTRIL ) 5 MG tablet Take 5 mg by mouth daily.   magnesium  oxide (MAG-OX) 400 (240 Mg) MG tablet Take 400 mg by mouth daily.   metoprolol  tartrate (LOPRESSOR ) 25 MG tablet Take 1/2 (one-half) tablet by mouth twice daily   Multiple Vitamin (MULTIVITAMIN) tablet Take 1 tablet by mouth daily.   naproxen  sodium (ALEVE ) 220 MG tablet Take 220 mg by mouth in the morning.   pantoprazole  (PROTONIX ) 40 MG tablet TAKE 1 TABLET BY MOUTH ONCE DAILY 30  MINUTES  PRIOR  TO  BREAKFAST   predniSONE  (DELTASONE ) 20 MG tablet 2 tabs po for 4 days, then 1 tab po for 4 days   rosuvastatin  (CRESTOR ) 40 MG tablet Take 1 tablet by mouth once daily   Simethicone  125 MG CAPS Take 125 mg by mouth daily as needed (gas).   spironolactone  (ALDACTONE ) 25 MG tablet Take 0.5 tablets (12.5 mg total) by mouth daily.   vitamin E  180 MG (400 UNITS) capsule Take 400 Units by mouth daily.    Allergies:  Honey bee venom [bee venom] and Diazepam   Social History   Socioeconomic History   Marital status: Married    Spouse name: Not on file   Number of children: 2   Years of education: Not on file   Highest education level: Some college, no degree  Occupational History   Occupation: Insurance Claims Handler: [employer]  Tobacco Use   Smoking status: Former    Average packs/day: 0.8 packs/day for 45.0 years (33.8 ttl pk-yrs)    Types: Cigarettes    Start date: 01/2023   Smokeless tobacco: Never   Tobacco comments:    Does not on starting back smoking.  Vaping Use   Vaping status: Never Used  Substance and Sexual Activity   Alcohol use: Yes    Alcohol/week: 0.0 standard drinks of alcohol    Comment: glass of wine twice a month    Drug use: No   Sexual activity: Not Currently  Other Topics Concern   Not on file  Social History Narrative   Not on file   Social Drivers of Health   Financial Resource Strain: High Risk (02/21/2024)   Overall Financial Resource Strain (CARDIA)    Difficulty of Paying Living Expenses: Hard  Food Insecurity: No Food Insecurity (03/29/2024)   Hunger Vital Sign    Worried About Running Out of Food in the Last Year: Never true    Ran Out of Food in the Last Year: Never true  Recent Concern: Food Insecurity - Food Insecurity Present (02/21/2024)   Hunger Vital Sign    Worried About Running Out of Food in the Last Year: Sometimes true    Ran Out of Food in the Last Year: Never true  Transportation Needs: No Transportation Needs (03/29/2024)   PRAPARE - Administrator, Civil Service (Medical): No    Lack of Transportation (Non-Medical): No  Physical Activity: Unknown (02/21/2024)   Exercise Vital Sign    Days of Exercise per Week: 0 days    Minutes of Exercise per Session: Not on file  Stress: Stress Concern Present (02/21/2024)    [employer] of Occupational Health - Occupational Stress Questionnaire    Feeling of Stress : Very much  Social Connections: Moderately Isolated (03/29/2024)   Social Connection and Isolation Panel    Frequency of Communication with Friends and Family: Twice a week    Frequency of Social Gatherings with Friends and Family: Once a week    Attends Religious Services: 1 to 4 times per year    Active Member of [employer] or Organizations: No    Attends Banker Meetings: Patient declined    Marital Status: Widowed     Family History:  The patient's family history includes Arthritis in her mother; Breast cancer in her sister; Cancer in her paternal grandmother; Colon polyps in her mother and sister; Diabetes in her mother; Heart disease in her mother; Hypercholesterolemia in her sister; Hyperlipidemia in her mother. There is no history of Colon cancer, Esophageal cancer, Rectal cancer, Stomach cancer, or Pancreatic cancer.  ROS:   12-point review of systems is negative unless otherwise noted in the HPI.   EKGs/Labs/Other Studies Reviewed:    Studies reviewed were summarized above. The additional studies were reviewed today:  2D echo 05/02/2024: 1. Left ventricular ejection fraction, by estimation, is 60 to 65%. Left  ventricular ejection fraction by 2D MOD biplane is 60.6 %. The left  ventricle has normal function. The left ventricle  has no regional wall  motion abnormalities. There is mild left  ventricular hypertrophy. Left ventricular diastolic parameters are  consistent with Grade I diastolic dysfunction (impaired relaxation).   2. Right ventricular systolic function is normal. The right ventricular  size is normal.   3. The mitral valve is normal in structure. No evidence of mitral valve  regurgitation.   4. The aortic valve has been repaired/replaced. Aortic valve  regurgitation is not visualized. Aortic valve mean gradient measures 6.3  mmHg.  __________   2D echo  01/21/2024: 1. Left ventricular ejection fraction, by estimation, is 65 to 70%. Left  ventricular ejection fraction by 3D volume is 60 %. The left ventricle has  normal function. The left ventricle has no regional wall motion  abnormalities. There is mild concentric  left ventricular hypertrophy. Left ventricular diastolic parameters are  consistent with Grade I diastolic dysfunction (impaired relaxation). The  average left ventricular global longitudinal strain is -19.2 %. The global  longitudinal strain is normal.   2. Right ventricular systolic function is normal. The right ventricular  size is normal.   3. Left atrial size was mildly dilated.   4. The mitral valve is normal in structure. Trivial mitral valve  regurgitation. No evidence of mitral stenosis.   5. The aortic valve is normal in structure. Aortic valve regurgitation is  not visualized. No aortic stenosis is present. There is a 34 Medtronic  CoreValve-Evolut Pro prosthetic (TAVR) valve present in the aortic  position. Procedure Date: 12/21/2023.  Aortic valve area, by VTI measures 3.19 cm. Aortic valve mean gradient  measures 6.0 mmHg. Aortic valve Vmax measures 1.74 m/s.   6. The inferior vena cava is normal in size with greater than 50%  respiratory variability, suggesting right atrial pressure of 3 mmHg.  __________   Zio patch 12/2023: Patient had a min HR of 56 bpm, max HR of 129 bpm, and avg HR of 72 bpm. Predominant underlying rhythm was Sinus Rhythm. Bundle Branch Block/IVCD was present. 1 run of Supraventricular Tachycardia occurred lasting 4 beats with a max rate of 129 bpm (avg  107 bpm).  Rare PACs and rare PVCs. No significant bradycardia overall. __________   Myocardial PYP scan 01/14/2024:   Myocardial uptake was negative for radiotracer uptake. The visual grade of myocardial uptake relative to the ribs was Grade 0 (No myocardial uptake and normal bone uptake).   Findings are not suggestive (Grade 0) of  cardiac ATTR amyloidosis.   Prior study not available for comparison. __________   2D echo 12/22/2023: 1. Left ventricular ejection fraction, by estimation, is 70 to 75%. The  left ventricle has hyperdynamic function. The left ventricle has no  regional wall motion abnormalities. There is mild concentric left  ventricular hypertrophy. Left ventricular  diastolic parameters are consistent with Grade I diastolic dysfunction  (impaired relaxation).   2. Right ventricular systolic function is normal. The right ventricular  size is normal. Tricuspid regurgitation signal is inadequate for assessing  PA pressure.   3. The mitral valve is degenerative. Trivial mitral valve regurgitation.  Moderate mitral annular calcification.   4. The aortic valve has been repaired/replaced. Aortic valve  regurgitation is not visualized. There is a 34mm Medtronic  CoreValve-Evolut Pro prosthetic (TAVR) valve present in the aortic  position. Procedure Date: 12/21/23. Echo findings are consistent  with normal structure and function of the aortic valve prosthesis. Aortic  valve area, by VTI measures 3.10 cm. Aortic valve mean gradient measures  8.0 mmHg. Aortic  valve Vmax measures 1.89 m/s.   5. The inferior vena cava is normal in size with greater than 50%  respiratory variability, suggesting right atrial pressure of 3 mmHg.  __________   Limited echo 12/21/2023: 1. Left ventricular ejection fraction, by estimation, is 60 to 65%. The  left ventricle has normal function. The left ventricle has no regional  wall motion abnormalities. Left ventricular diastolic parameters are  consistent with Grade I diastolic  dysfunction (impaired relaxation).   2. Right ventricular systolic function is normal. The right ventricular  size is normal.   3. The mitral valve is degenerative. No evidence of mitral valve  regurgitation. Moderate mitral annular calcification.   4. TAVR Pre procedure: mean gradient 30 mmHg. Peak  gradient 45 mm Hg. DVI  .34. AVA 1.44cm2. Mild to moderate central AI      Post deployment: mean gradient 2 mmHg. Peak gradient 4 mm Hg DVI 0.65.  EOA 3.12cm2 with trivial PVL. Aortic valve regurgitation is mild to  moderate. There is a 34 mm Medtronic stented (TAVR) valve present in the  aortic position.   Comparison(s): Successful TAVR Placement.  __________   Carotid artery ultrasound 10/28/2023: Summary:  Right Carotid: Velocities in the right ICA are consistent with a 1-39% stenosis.   Left Carotid: Velocities in the left ICA are consistent with a 1-39% stenosis.   Vertebrals: Bilateral vertebral arteries demonstrate antegrade flow.  Subclavians: Right subclavian artery flow was disturbed. Normal flow hemodynamics were seen in the left subclavian artery.  __________  [facility] 10/04/2023: 1.  Mild nonobstructive coronary artery disease. 2.  Moderate to severe aortic stenosis with mean gradient of 32 mmHg and valve area of 1.1 cm. 3.  Right heart catheterization showed normal RA pressure, mildly elevated wedge pressure, minimal pulmonary hypertension and normal cardiac output.   Recommendations: Proceed with TAVR evaluation. __________   2D echo 09/20/2023: 1. Left ventricular ejection fraction, by estimation, is 60 to 65%. The  left ventricle has normal function. The left ventricle has no regional  wall motion abnormalities. There is mild left ventricular hypertrophy.  Left ventricular diastolic parameters  are consistent with Grade I diastolic dysfunction (impaired relaxation).   2. Right ventricular systolic function is normal. The right ventricular  size is normal.   3. The mitral valve is degenerative. Mild mitral valve regurgitation.   4. AV mean gradient , PG , DVI 0.24, AVA 0.7cm2. The aortic  valve is calcified. Aortic valve regurgitation is mild. Severe aortic  valve stenosis.   5. The inferior vena cava is dilated in size with >50% respiratory   variability, suggesting right atrial pressure of 8 mmHg.  __________   2D echo 11/11/2022: 1. Left ventricular ejection fraction, by estimation, is 65 to 70%. The  left ventricle has normal function. The left ventricle has no regional  wall motion abnormalities. There is mild left ventricular hypertrophy.  Left ventricular diastolic parameters  are consistent with Grade I diastolic dysfunction (impaired relaxation).   2. Right ventricular systolic function is normal. The right ventricular  size is normal.   3. The mitral valve is degenerative. No evidence of mitral valve  regurgitation.   4. Mean aortic valve gradient , peak gradient , Vmax 3.33m/s,  DVI 0.37.SABRA The aortic valve is calcified. Aortic valve regurgitation is  mild. Moderate aortic valve stenosis. Aortic valve area, by VTI measures  1.53 cm.   5. The inferior vena cava is dilated in size with >50% respiratory  variability, suggesting right atrial pressure  of 8 mmHg.  ___________   2D echo 12/02/2021: 1. Left ventricular ejection fraction, by estimation, is 65 to 70%. The  left ventricle has normal function. The left ventricle has no regional  wall motion abnormalities. There is mild left ventricular hypertrophy.  Left ventricular diastolic parameters  were normal.   2. Right ventricular systolic function is normal. The right ventricular  size is normal.   3. The mitral valve is degenerative. No evidence of mitral valve  regurgitation.   4. The aortic valve is calcified. Aortic valve regurgitation is mild to  moderate. Moderate aortic valve stenosis. Aortic valve mean gradient  measures 29.2 mmHg. Aortic valve Vmax measures 3.35 m/s.   5. The inferior vena cava is normal in size with greater than 50%  respiratory variability, suggesting right atrial pressure of 3 mmHg __________   Carotid artery ultrasound 07/04/2021: Summary:  Right Carotid: Velocities in the right ICA are consistent with a 1-39%   stenosis. Non-hemodynamically significant plaque <50% noted in the  CCA. The ECA appears <50% stenosed.   Left Carotid: Velocities in the left ICA are consistent with a 1-39%  stenosis. Non-hemodynamically significant plaque <50% noted in the CCA. The ECA appears <50% stenosed.   Vertebrals:  Bilateral vertebral arteries demonstrate antegrade flow.  Subclavians: Normal flow hemodynamics were seen in bilateral subclavian arteries. __________   LHC 11/11/2020: 1.  Mild nonobstructive coronary artery disease.  Tortuous vessels overall suggestive of chronic hypertension. 2.  Left ventricular angiography was not performed.  EF was normal by echo. 3.  Right heart catheterization showed normal filling pressures, normal pulmonary pressure and normal cardiac output. 4.  Moderate aortic stenosis with mean gradient of 24 mmHg and valve area of 1.44 cm.   Recommendations: No culprit is identified for the patient's chest pain.  Recommend medical therapy for nonobstructive coronary artery disease. Continue to monitor aortic stenosis with echocardiograms. The patient can be discharged home from a cardiac standpoint.  No other issues. __________   2D echo 12/2/201.  1. Left ventricular ejection fraction, by estimation, is 60 to 65%. The  left ventricle has normal function. The left ventricle has no regional  wall motion abnormalities. There is mild to moderate left ventricular  hypertrophy. Left ventricular diastolic  parameters are consistent with Grade I diastolic dysfunction (impaired  relaxation). The average left ventricular global longitudinal strain is  -15.3 %. The global longitudinal strain is abnormal.   2. Right ventricular systolic function is normal. The right ventricular  size is normal.   3. Left atrial size was mildly dilated.   4. The aortic valve is moderately calcified. Aortic valve regurgitation  is mild. Moderate aortic valve stenosis. Aortic valve area, by VTI  measures  1.29 cm. Aortic valve mean gradient measures 22.2 mmHg. Aortic  valve Vmax measures 3.34 m/s. __________   2D echo 08/23/2019: 1. The left ventricle has normal systolic function with an ejection  fraction of 60-65%. The cavity size was normal. There is mild concentric  left ventricular hypertrophy. Left ventricular diastolic Doppler  parameters are consistent with impaired  relaxation.   2. The right ventricle has normal systolic function. The cavity was  normal. There is No increase in right ventricular wall thickness.   3. Left atrial size was normal.   4. Right atrial size was normal.   5. The mitral valve is abnormal. mild calcification of the mitral valve  leaflet.   6. The aortic valve is abnormal. Moderate sclerosis of the aortic valve.  Aortic valve regurgitation is mild by color flow Doppler. Mild-moderate  stenosis of the aortic valve. DVI 0.36   7. The aorta is normal unless otherwise noted.   8. The pulmonic valve was not well visualized. Pulmonic valve  regurgitation was not assessed by color flow Doppler. __________   Coronary CTA 10/05/2018: FINDINGS: Non-cardiac: See separate report from [facility]. No significant findings on limited lung and soft tissue windows.   Calcium  score: Dense calcium  noted in LAD and RCA   Coronary Arteries: Right dominant with no anomalies   LM: Distal LM less than 50% calcified stenosis   LAD: Ostial and mid LAD less than 50% calcific stenosis   D1: Small vessel less than 30% mixed plaque   D2: Large branching vessel 50% calcific plaque proximally   Circumflex: Less than 50% calcified plaque at ostium   OM1: Small branch without significant disease   RCA: 50% or less calcific plaque in proximal mid and distal vessel   PDA: Less than 30% calcific plaque   PLA: Less than 30% calcific plaque   IMPRESSION: 1. Calcific aortic atherosclerosis moderate Normal aortic root 2.9 cm 2.  Calcified Aortic Valve suggest TTE  correlation for AS 3.  Calcium  score 521 which is 94 th percentile for age and sex 4. Moderate non obstructive appearing disease primarily in RCA and LAD as well as D2 Study will be sent for FFR CT __________   2D echo 08/17/2018: - Left ventricle: The cavity size was at the upper limits of    normal. Wall thickness was increased in a pattern of mild LVH.    Systolic function was normal. The estimated ejection fraction was    in the range of 60% to 65%. Wall motion was normal; there were no    regional wall motion abnormalities. Doppler parameters are    consistent with abnormal left ventricular relaxation (grade 1    diastolic dysfunction).  - Aortic valve: Transvalvular velocity was increased. There was    mild to moderate stenosis. There was mild to moderate    regurgitation. Mean gradient (S): 19 mm Hg. Valve area (VTI):    1.28 cm^2.  - Mitral valve: Calcified annulus.  - Left atrium: The atrium was mildly dilated.  - Right ventricle: The cavity size was normal. Systolic function    was normal. ___________   2D echo 08/05/2016: - Left ventricle: The cavity size was normal. There was mild focal    basal hypertrophy of the septum. Systolic function was normal.    The estimated ejection fraction was in the range of 60% to 65%.    Wall motion was normal; there were no regional wall motion    abnormalities. Left ventricular diastolic function parameters    were normal for the patient&'s age.  - Aortic valve: Calcified annulus. Probably trileaflet. There was    mild stenosis, based on peak velocity and mean gradient. There    was moderate regurgitation. __________   2D echo 03/17/2012: - Left ventricle: The cavity size was normal. There was mild    to moderate concentric hypertrophy. Systolic function was    normal. The estimated ejection fraction was in the range    of 55% to 60%. Wall motion was normal; there were no    regional wall motion abnormalities. Doppler parameters are     consistent with abnormal left ventricular relaxation    (grade 1 diastolic dysfunction).  - Aortic valve: Mild regurgitation.  - Mitral valve: Mild regurgitation.  -  Right ventricle: Systolic function was normal.  - Pulmonary arteries: Systolic pressure was within the    normal range.   EKG:  EKG is ordered today.  The EKG ordered today demonstrates NSR, 80 bpm, RBBB, baseline artifact, nonspecific ST-T changes  Recent Labs: 03/29/2024: ALT 14 03/31/2024: Magnesium  2.4 04/28/2024: Hemoglobin 13.5; Platelets 194 07/05/2024: BNP 21.8; BUN 11; Creatinine, Ser 0.86; Potassium 4.1; Sodium 136  Recent Lipid Panel    Component Value Date/Time   CHOL 141 04/05/2024 1625   TRIG 211 (H) 04/05/2024 1625   HDL 60 04/05/2024 1625   CHOLHDL 2.4 04/05/2024 1625   CHOLHDL 2 01/28/2023 1158   VLDL 18.8 01/28/2023 1158   LDLCALC 48 04/05/2024 1625   LDLDIRECT 209.2 05/28/2009 0909    PHYSICAL EXAM:    VS:  BP 133/62 (BP Location: Left Wrist, Patient Position: Sitting, Cuff Size: Normal)   Pulse 80 Comment: 85 oximeter  Ht 5' 8 (1.727 m)   Wt 232 lb 6.4 oz (105.4 kg)   SpO2 99%   BMI 35.34 kg/m   BMI: Body mass index is 35.34 kg/m.  Physical Exam Vitals reviewed.  Constitutional:      Appearance: She is well-developed.  HENT:     Head: Normocephalic and atraumatic.  Eyes:     General:        Right eye: No discharge.        Left eye: No discharge.  Cardiovascular:     Rate and Rhythm: Normal rate and regular rhythm.     Pulses:          Posterior tibial pulses are 2+ on the right side and 2+ on the left side.     Heart sounds: Normal heart sounds, S1 normal and S2 normal. Heart sounds not distant. No midsystolic click and no opening snap. No murmur heard.    No friction rub.  Pulmonary:     Effort: Pulmonary effort is normal. No respiratory distress.     Breath sounds: Normal breath sounds. No decreased breath sounds, wheezing, rhonchi or rales.  Musculoskeletal:     Cervical  back: Normal range of motion.     Right lower leg: No edema.     Left lower leg: No edema.  Skin:    General: Skin is warm and dry.     Nails: There is no clubbing.  Neurological:     Mental Status: She is alert and oriented to person, place, and time.  Psychiatric:        Speech: Speech normal.        Behavior: Behavior normal.        Thought Content: Thought content normal.        Judgment: Judgment normal.     Wt Readings from Last 3 Encounters:  10/13/24 232 lb 6.4 oz (105.4 kg)  08/30/24 235 lb 6.4 oz (106.8 kg)  08/21/24 236 lb (107 kg)     ASSESSMENT & PLAN:   Nonobstructive CAD: She is doing well and without symptoms concerning for angina or cardiac decompensation.  Recent LHC in 09/2023 showed minimal nonobstructive disease.  Continue aggressive risk factor modification and primary prevention including aspirin  81 mg and rosuvastatin  40 mg.  No indication for further ischemic testing at this time.  Diastolic dysfunction: Euvolemic and well compensated.  No longer on SGLT2 inhibitor or MRA due to prior orthostasis.  Continue as needed furosemide  20 mg, uses sparingly for increased dyspnea, weight gain greater than 3 pounds overnight or 5 pounds in  1 week.  Recent labs stable.  Moderate to severe aortic stenosis: Status post TAVR.  Normal functioning valvular prosthesis on echo in 04/2024.  Scheduled for follow-up echo in 12/2024 through the structural heart clinic.  Remains on aspirin .  SBE prophylaxis indicated for dental procedures.  HLD: LDL 48 in 03/2024.  Remains on rosuvastatin  40 mg.  Carotid artery stenosis: Imaging in 10/2023 showed 1 to 39% bilateral carotid artery stenosis that was stable dating back to 2022.  She remains on aspirin  and statin as outlined above.  COPD: Stable without acute exacerbation.     Disposition: F/u with Dr. Darron or an APP in 6 months.   Medication Adjustments/Labs and Tests Ordered: Current medicines are reviewed at length with the  patient today.  Concerns regarding medicines are outlined above. Medication changes, Labs and Tests ordered today are summarized above and listed in the Patient Instructions accessible in Encounters.   Signed, Bernardino Bring, PA-C 10/13/2024 4:20 PM     [facility] [place] [address] [place], [place] [address] [phone]

## 2024-10-12 DIAGNOSIS — M6281 Muscle weakness (generalized): Secondary | ICD-10-CM | POA: Diagnosis not present

## 2024-10-12 DIAGNOSIS — M5459 Other low back pain: Secondary | ICD-10-CM | POA: Diagnosis not present

## 2024-10-12 DIAGNOSIS — M25552 Pain in left hip: Secondary | ICD-10-CM | POA: Diagnosis not present

## 2024-10-13 ENCOUNTER — Encounter: Payer: Self-pay | Admitting: Physician Assistant

## 2024-10-13 ENCOUNTER — Ambulatory Visit: Attending: Physician Assistant | Admitting: Physician Assistant

## 2024-10-13 VITALS — BP 133/62 | HR 80 | Ht 68.0 in | Wt 232.4 lb

## 2024-10-13 DIAGNOSIS — I6523 Occlusion and stenosis of bilateral carotid arteries: Secondary | ICD-10-CM | POA: Diagnosis not present

## 2024-10-13 DIAGNOSIS — J449 Chronic obstructive pulmonary disease, unspecified: Secondary | ICD-10-CM | POA: Diagnosis not present

## 2024-10-13 DIAGNOSIS — I350 Nonrheumatic aortic (valve) stenosis: Secondary | ICD-10-CM | POA: Diagnosis not present

## 2024-10-13 DIAGNOSIS — I5189 Other ill-defined heart diseases: Secondary | ICD-10-CM | POA: Insufficient documentation

## 2024-10-13 DIAGNOSIS — Z952 Presence of prosthetic heart valve: Secondary | ICD-10-CM | POA: Insufficient documentation

## 2024-10-13 DIAGNOSIS — E785 Hyperlipidemia, unspecified: Secondary | ICD-10-CM | POA: Insufficient documentation

## 2024-10-13 DIAGNOSIS — I2510 Atherosclerotic heart disease of native coronary artery without angina pectoris: Secondary | ICD-10-CM | POA: Diagnosis not present

## 2024-10-13 NOTE — Patient Instructions (Signed)
 Medication Instructions:   Your physician recommends that you continue on your current medications as directed. Please refer to the Current Medication list given to you today.    *If you need a refill on your cardiac medications before your next appointment, please call your pharmacy*  Lab Work:  None ordered at this time   If you have labs (blood work) drawn today and your tests are completely normal, you will receive your results only by:  MyChart Message (if you have MyChart) OR  A paper copy in the mail If you have any lab test that is abnormal or we need to change your treatment, we will call you to review the results.  Testing/Procedures:  None ordered at this time   Referrals:  None ordered at this time   Follow-Up:  At [facility], you and your health needs are our priority.  As part of our continuing mission to provide you with exceptional heart care, our providers are all part of one team.  This team includes your primary Cardiologist (physician) and Advanced Practice Providers or APPs (Physician Assistants and Nurse Practitioners) who all work together to provide you with the care you need, when you need it.  Your next appointment:   5 - 6 month(s)  Provider:    Deatrice Cage, MD or Bernardino Bring, PA-C    We recommend signing up for the patient portal called MyChart.  Sign up information is provided on this After Visit Summary.  MyChart is used to connect with patients for Virtual Visits (Telemedicine).  Patients are able to view lab/test results, encounter notes, upcoming appointments, etc.  Non-urgent messages can be sent to your provider as well.   To learn more about what you can do with MyChart, go to [email or web address].

## 2024-10-16 ENCOUNTER — Encounter: Payer: Self-pay | Admitting: Family Medicine

## 2024-10-16 ENCOUNTER — Encounter: Payer: Self-pay | Admitting: Cardiovascular Disease

## 2024-10-19 DIAGNOSIS — M5459 Other low back pain: Secondary | ICD-10-CM | POA: Diagnosis not present

## 2024-10-19 DIAGNOSIS — M6281 Muscle weakness (generalized): Secondary | ICD-10-CM | POA: Diagnosis not present

## 2024-10-19 DIAGNOSIS — M25552 Pain in left hip: Secondary | ICD-10-CM | POA: Diagnosis not present

## 2024-10-23 DIAGNOSIS — R29898 Other symptoms and signs involving the musculoskeletal system: Secondary | ICD-10-CM | POA: Diagnosis not present

## 2024-10-23 DIAGNOSIS — E538 Deficiency of other specified B group vitamins: Secondary | ICD-10-CM | POA: Diagnosis not present

## 2024-10-23 DIAGNOSIS — G629 Polyneuropathy, unspecified: Secondary | ICD-10-CM | POA: Diagnosis not present

## 2024-10-23 DIAGNOSIS — Z1331 Encounter for screening for depression: Secondary | ICD-10-CM | POA: Diagnosis not present

## 2024-10-24 DIAGNOSIS — M25552 Pain in left hip: Secondary | ICD-10-CM | POA: Diagnosis not present

## 2024-10-24 DIAGNOSIS — M6281 Muscle weakness (generalized): Secondary | ICD-10-CM | POA: Diagnosis not present

## 2024-10-24 DIAGNOSIS — M5459 Other low back pain: Secondary | ICD-10-CM | POA: Diagnosis not present

## 2024-10-26 DIAGNOSIS — M5459 Other low back pain: Secondary | ICD-10-CM | POA: Diagnosis not present

## 2024-10-26 DIAGNOSIS — M6281 Muscle weakness (generalized): Secondary | ICD-10-CM | POA: Diagnosis not present

## 2024-10-26 DIAGNOSIS — M25552 Pain in left hip: Secondary | ICD-10-CM | POA: Diagnosis not present

## 2024-10-31 DIAGNOSIS — M5459 Other low back pain: Secondary | ICD-10-CM | POA: Diagnosis not present

## 2024-10-31 DIAGNOSIS — M25552 Pain in left hip: Secondary | ICD-10-CM | POA: Diagnosis not present

## 2024-10-31 DIAGNOSIS — M6281 Muscle weakness (generalized): Secondary | ICD-10-CM | POA: Diagnosis not present

## 2024-11-03 DIAGNOSIS — M5459 Other low back pain: Secondary | ICD-10-CM | POA: Diagnosis not present

## 2024-11-03 DIAGNOSIS — M6281 Muscle weakness (generalized): Secondary | ICD-10-CM | POA: Diagnosis not present

## 2024-11-03 DIAGNOSIS — M25552 Pain in left hip: Secondary | ICD-10-CM | POA: Diagnosis not present

## 2024-11-07 DIAGNOSIS — M25552 Pain in left hip: Secondary | ICD-10-CM | POA: Diagnosis not present

## 2024-11-07 DIAGNOSIS — M5459 Other low back pain: Secondary | ICD-10-CM | POA: Diagnosis not present

## 2024-11-07 DIAGNOSIS — M6281 Muscle weakness (generalized): Secondary | ICD-10-CM | POA: Diagnosis not present

## 2024-11-09 DIAGNOSIS — M6281 Muscle weakness (generalized): Secondary | ICD-10-CM | POA: Diagnosis not present

## 2024-11-09 DIAGNOSIS — M25552 Pain in left hip: Secondary | ICD-10-CM | POA: Diagnosis not present

## 2024-11-09 DIAGNOSIS — M5459 Other low back pain: Secondary | ICD-10-CM | POA: Diagnosis not present

## 2024-11-14 ENCOUNTER — Ambulatory Visit
Admission: RE | Admit: 2024-11-14 | Discharge: 2024-11-14 | Disposition: A | Source: Ambulatory Visit | Attending: Family Medicine | Admitting: Family Medicine

## 2024-11-14 DIAGNOSIS — Z1231 Encounter for screening mammogram for malignant neoplasm of breast: Secondary | ICD-10-CM

## 2024-11-14 DIAGNOSIS — M5459 Other low back pain: Secondary | ICD-10-CM | POA: Diagnosis not present

## 2024-11-14 DIAGNOSIS — M6281 Muscle weakness (generalized): Secondary | ICD-10-CM | POA: Diagnosis not present

## 2024-11-14 DIAGNOSIS — M25552 Pain in left hip: Secondary | ICD-10-CM | POA: Diagnosis not present

## 2024-11-14 DIAGNOSIS — Z23 Encounter for immunization: Secondary | ICD-10-CM | POA: Diagnosis not present

## 2024-11-15 ENCOUNTER — Ambulatory Visit: Admitting: Podiatry

## 2024-11-16 DIAGNOSIS — M25552 Pain in left hip: Secondary | ICD-10-CM | POA: Diagnosis not present

## 2024-11-16 DIAGNOSIS — M6281 Muscle weakness (generalized): Secondary | ICD-10-CM | POA: Diagnosis not present

## 2024-11-16 DIAGNOSIS — M5459 Other low back pain: Secondary | ICD-10-CM | POA: Diagnosis not present

## 2024-11-20 ENCOUNTER — Other Ambulatory Visit: Payer: Self-pay | Admitting: Family Medicine

## 2024-11-20 DIAGNOSIS — R928 Other abnormal and inconclusive findings on diagnostic imaging of breast: Secondary | ICD-10-CM

## 2024-11-21 DIAGNOSIS — M25552 Pain in left hip: Secondary | ICD-10-CM | POA: Diagnosis not present

## 2024-11-21 DIAGNOSIS — M5459 Other low back pain: Secondary | ICD-10-CM | POA: Diagnosis not present

## 2024-11-21 DIAGNOSIS — M6281 Muscle weakness (generalized): Secondary | ICD-10-CM | POA: Diagnosis not present

## 2024-11-22 ENCOUNTER — Ambulatory Visit: Admitting: Podiatry

## 2024-11-22 DIAGNOSIS — E1142 Type 2 diabetes mellitus with diabetic polyneuropathy: Secondary | ICD-10-CM

## 2024-11-22 DIAGNOSIS — M79676 Pain in unspecified toe(s): Secondary | ICD-10-CM

## 2024-11-22 DIAGNOSIS — B351 Tinea unguium: Secondary | ICD-10-CM

## 2024-11-22 NOTE — Progress Notes (Signed)
 Presents today chief complaint of painfully elongated toenails.  Objective: Toenails are long thick yellow dystrophic clinically mycotic pulses are palpable.  No open lesions or wounds.  Assessment: Pain limb secondary to onychomycosis.  Plan: Debridement of toenails 1 through 5 bilateral.

## 2024-11-23 DIAGNOSIS — M6281 Muscle weakness (generalized): Secondary | ICD-10-CM | POA: Diagnosis not present

## 2024-11-23 DIAGNOSIS — M5459 Other low back pain: Secondary | ICD-10-CM | POA: Diagnosis not present

## 2024-11-23 DIAGNOSIS — M25552 Pain in left hip: Secondary | ICD-10-CM | POA: Diagnosis not present

## 2024-11-24 ENCOUNTER — Other Ambulatory Visit: Payer: Self-pay | Admitting: Family Medicine

## 2024-11-24 NOTE — Telephone Encounter (Signed)
 Last office visit 08/21/2024 for Fall, Mass and Depression.  Last refilled 02/04/2024 for #60 with 5 refills.  Next appt: CPE 02/21/2025

## 2024-11-27 ENCOUNTER — Inpatient Hospital Stay: Admission: RE | Admit: 2024-11-27 | Discharge: 2024-11-27 | Attending: Family Medicine | Admitting: Family Medicine

## 2024-11-27 ENCOUNTER — Ambulatory Visit
Admission: RE | Admit: 2024-11-27 | Discharge: 2024-11-27 | Disposition: A | Discharge status: Home / Self Care | Source: Ambulatory Visit | Attending: Family Medicine | Admitting: Family Medicine

## 2024-11-27 DIAGNOSIS — R928 Other abnormal and inconclusive findings on diagnostic imaging of breast: Secondary | ICD-10-CM | POA: Diagnosis present

## 2024-12-06 ENCOUNTER — Ambulatory Visit
Admission: RE | Admit: 2024-12-06 | Discharge: 2024-12-06 | Disposition: A | Discharge status: Home / Self Care | Source: Ambulatory Visit | Attending: Family Medicine | Admitting: Family Medicine

## 2024-12-06 ENCOUNTER — Other Ambulatory Visit: Payer: Self-pay | Admitting: Family Medicine

## 2024-12-06 DIAGNOSIS — R928 Other abnormal and inconclusive findings on diagnostic imaging of breast: Secondary | ICD-10-CM

## 2024-12-06 HISTORY — PX: BREAST BIOPSY: SHX20

## 2024-12-06 MED ORDER — LIDOCAINE-EPINEPHRINE 1 %-1:100000 IJ SOLN
5.0000 mL | Freq: Once | INTRAMUSCULAR | Status: AC
  Administered 2024-12-06: 5 mL
  Filled 2024-12-06: qty 10

## 2024-12-06 MED ORDER — LIDOCAINE 1 % OPTIME INJ - NO CHARGE
2.0000 mL | Freq: Once | INTRAMUSCULAR | Status: AC
  Administered 2024-12-06: 2 mL
  Filled 2024-12-06: qty 2

## 2024-12-08 LAB — SURGICAL PATHOLOGY

## 2024-12-12 ENCOUNTER — Other Ambulatory Visit: Payer: Self-pay | Admitting: Family Medicine

## 2024-12-12 DIAGNOSIS — R928 Other abnormal and inconclusive findings on diagnostic imaging of breast: Secondary | ICD-10-CM

## 2024-12-13 ENCOUNTER — Ambulatory Visit
Admission: RE | Admit: 2024-12-13 | Discharge: 2024-12-13 | Disposition: A | Discharge status: Home / Self Care | Source: Ambulatory Visit | Attending: Family Medicine | Admitting: Family Medicine

## 2024-12-13 DIAGNOSIS — R928 Other abnormal and inconclusive findings on diagnostic imaging of breast: Secondary | ICD-10-CM | POA: Diagnosis present

## 2024-12-13 HISTORY — PX: BREAST BIOPSY: SHX20

## 2024-12-13 MED ORDER — LIDOCAINE 1 % OPTIME INJ - NO CHARGE
5.0000 mL | Freq: Once | INTRAMUSCULAR | Status: AC
  Administered 2024-12-13: 5 mL
  Filled 2024-12-13: qty 6

## 2024-12-13 MED ORDER — LIDOCAINE-EPINEPHRINE 1 %-1:100000 IJ SOLN
20.0000 mL | Freq: Once | INTRAMUSCULAR | Status: AC
  Administered 2024-12-13: 20 mL
  Filled 2024-12-13: qty 20

## 2024-12-15 LAB — SURGICAL PATHOLOGY

## 2024-12-18 ENCOUNTER — Encounter: Payer: Self-pay | Admitting: *Deleted

## 2024-12-18 DIAGNOSIS — C50912 Malignant neoplasm of unspecified site of left female breast: Secondary | ICD-10-CM

## 2024-12-18 NOTE — Progress Notes (Signed)
 Received referral for newly diagnosed breast cancer from [facility].  Navigation initiated.  Ms. Asbill will see Dr. Melanee on 1/16 at 2:30.  Referral sent to [facility], they will call her with the appointment.

## 2024-12-21 ENCOUNTER — Ambulatory Visit (INDEPENDENT_AMBULATORY_CARE_PROVIDER_SITE_OTHER): Payer: Medicare Other | Admitting: Physician Assistant

## 2024-12-21 ENCOUNTER — Ambulatory Visit: Payer: Self-pay | Admitting: Physician Assistant

## 2024-12-21 ENCOUNTER — Encounter (HOSPITAL_COMMUNITY): Payer: Self-pay

## 2024-12-21 ENCOUNTER — Ambulatory Visit (HOSPITAL_COMMUNITY): Admission: RE | Admit: 2024-12-21 | Payer: Medicare Other

## 2024-12-21 ENCOUNTER — Ambulatory Visit (HOSPITAL_COMMUNITY)
Admission: RE | Admit: 2024-12-21 | Discharge: 2024-12-21 | Disposition: A | Discharge status: Home / Self Care | Source: Ambulatory Visit | Attending: Physician Assistant | Admitting: Physician Assistant

## 2024-12-21 VITALS — BP 128/68 | HR 72 | Temp 98.0°F

## 2024-12-21 DIAGNOSIS — I10 Essential (primary) hypertension: Secondary | ICD-10-CM | POA: Insufficient documentation

## 2024-12-21 DIAGNOSIS — E785 Hyperlipidemia, unspecified: Secondary | ICD-10-CM | POA: Diagnosis present

## 2024-12-21 DIAGNOSIS — I5032 Chronic diastolic (congestive) heart failure: Secondary | ICD-10-CM

## 2024-12-21 DIAGNOSIS — Z952 Presence of prosthetic heart valve: Secondary | ICD-10-CM | POA: Diagnosis present

## 2024-12-21 DIAGNOSIS — C50919 Malignant neoplasm of unspecified site of unspecified female breast: Secondary | ICD-10-CM | POA: Diagnosis present

## 2024-12-21 LAB — ECHOCARDIOGRAM COMPLETE
AR max vel: 2.15 cm2
AV Area mean vel: 2.5 cm2
AV Mean grad: 9 mmHg
AV Peak grad: 18.5 mmHg
Ao pk vel: 2.15 m/s
Area-P 1/2: 2.46 cm2
S' Lateral: 2.2 cm

## 2024-12-21 NOTE — Progress Notes (Signed)
 " [facility]   MULTIDISCIPLINARY HEART VALVE CLINIC                                     Cardiology Office Note:    Date:  12/21/2024   ID:  Amanda [name], DOB [birth year], MRN [id number]  PCP:  Amanda Mirza, MD  [facility] Cardiologist:  Amanda Cage, MD  [facility] Structural heart: Amanda MARLA Red, MD [facility] Electrophysiologist:  None   Referring MD: Amanda Mirza, MD   1 year s/p TAVR  History of Present Illness:    Amanda [name] is a 74 y.o. female with a hx of RBBB, DM-diet controlled, HTN, HLD, morbid obesity (BMI 35), CKD stage II, CAD, COPD with previous tobacco abuse, mild cognitive impairment, recently diagnosis of  and severe aortic stenosis s/p TAVR (12/21/23) who presents to clinic for follow up.   She developed exertional shortness of breath and dizziness. Echo 09/20/23 showed EF 60% and severe AS with mean grad 33 mmHg, AVA 0.66 cm2, DVI 0.22 and mild MR & AI. L/RHC 10/04/23 showed mild nonobstructive coronary artery disease. S/p TAVR with a 34 mm Medtronic Evolut FX THV via the TF approach on 12/21/23. Post operative echo showed EF 70%, normally functioning TAVR with a mean gradient of 8 mmHg and no PVL. She was treated with one dose of IV lasix  given elevated LVEDP at the time of TAVR. Discharged on a baby aspirin  81mg  daily. With chronic RBBB and increased risk for HAVB, she was discharged home with a Zio AT which did not show any HAVB. Screened for amyloid given neuropathy and orthostasis. PYP was negative.    Today the patient presents to clinic for follow up. Here with daughter, Ivin- who she lives with. 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 Seeing oncolgoy tomorrow. No CP. Has mild SOB when walking to the mailbox but does an exercise class at the senior center 3 days a week and works with PT for hip bursitis and leg pain 2x a week with no issues. No LE edema, orthopnea or PND. No dizziness or syncope. Had orthostasis that improved with  stopping Prozac . No blood in stool or urine. No palpitations.    Past Medical History:  Diagnosis Date   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 sting    Aortic stenosis    a. 2013: nl LV sys fxn, mild MR, no evidence of pulm htn; b. TTE 8/17: EF 60-65%, no RWMA, nl LV dia fxn,  mild AS, mod AI   CAD (coronary artery disease), native coronary artery 11/26/2020   Controlled type 2 diabetes mellitus with diabetic autonomic neuropathy, without long-term current use of insulin  (HCC) 08/10/2007   COPD (chronic obstructive pulmonary disease) (HCC) 10/30/2016   Depression    GAD (generalized anxiety disorder)    GERD (gastroesophageal reflux disease)    Hiatal hernia with gastroesophageal reflux 1997   Hyperlipidemia    Hypertension    Osteoporosis    S/P TAVR (transcatheter aortic valve replacement) 12/21/2023   s/p TAVR with a 34 mm Evolut FX via the TF approach by Amanda [name] & Amanda [name]   Tobacco abuse      Current Medications: Active Medications[1]    ROS:   Please see the history of present illness.    All other systems reviewed and are negative.  EKGs       Risk Assessment/Calculations:  Physical Exam:    VS:  BP 128/68   Pulse 72   Temp 98 F (36.7 C)     Wt Readings from Last 3 Encounters:  10/13/24 232 lb 6.4 oz (105.4 kg)  08/30/24 235 lb 6.4 oz (106.8 kg)  08/21/24 236 lb (107 kg)     GEN: Well nourished, well developed in no acute distress, obese NECK: No JVD CARDIAC: RRR, no murmurs, rubs, gallops RESPIRATORY:  Clear to auscultation without rales, wheezing or rhonchi  ABDOMEN: Soft, non-tender, non-distended EXTREMITIES:  No edema; No deformity.   ASSESSMENT:    1. S/P TAVR (transcatheter aortic valve replacement)   2. Chronic heart failure with preserved ejection fraction (HFpEF) (HCC)   3. Essential hypertension   4. Hyperlipidemia, unspecified hyperlipidemia type   5. Malignant neoplasm of female breast, unspecified estrogen receptor status,  unspecified laterality, unspecified site of breast (HCC)     PLAN:    In order of problems listed above:  Severe AS s/p TAVR:  -- Echo today shows EF 60%, mild cLVH, normally functioning TAVR with a mean gradient of 9 mm hg and no PVL.  -- NYHA class I symptoms.  -- Continue Aspirin  81mg  daily.  -- The patient is edentulous and does not go to the dentist.  -- Continue regular follow up with Dr. Darron.   Chronic HFpEF:  -- Appears euvolemic. -- Continue Lasix  20mg  PRN.    HTN:  -- BP 130/70 today.  -- Continue Lopressor  12.5 mg BID and Lisinopril  5mg .  HLD: -- Continue Crestor  40mg  daily and Zetia  10mg  daily.   Breast Cancer: -- New diagnosis- seeing oncology tomorrow.    Medication Adjustments/Labs and Tests Ordered: Current medicines are reviewed at length with the patient today.  Concerns regarding medicines are outlined above.  No orders of the defined types were placed in this encounter.  No orders of the defined types were placed in this encounter.   Patient Instructions  Medication Instructions:  Your physician recommends that you continue on your current medications as directed. Please refer to the Current Medication list given to you today.  *If you need a refill on your cardiac medications before your next appointment, please call your pharmacy*  Lab Work: None nededed If you have labs (blood work) drawn today and your tests are completely normal, you will receive your results only by: MyChart Message (if you have MyChart) OR A paper copy in the mail If you have any lab test that is abnormal or we need to change your treatment, we will call you to review the results.  Testing/Procedures: None needed  Follow-Up: At [facility], you and your health needs are our priority.  As part of our continuing mission to provide you with exceptional heart care, our providers are all part of one team.  This team includes your primary Cardiologist (physician)  and Advanced Practice Providers or APPs (Physician Assistants and Nurse Practitioners) who all work together to provide you with the care you need, when you need it.  Your next appointment:   As scheduled on 03/16/25   Provider:   Bernardino Bring, PA-C  We recommend signing up for the patient portal called MyChart.  Sign up information is provided on this After Visit Summary.  MyChart is used to connect with patients for Virtual Visits (Telemedicine).  Patients are able to view lab/test results, encounter notes, upcoming appointments, etc.  Non-urgent messages can be sent to your provider as well.   To learn more  about what you can do with MyChart, go to [email or web address].   Other Instructions            Signed, Lamarr Hummer, PA-C  12/21/2024 9:07 PM    [facility]     [1]  Current Meds  Medication Sig   acetaminophen  (TYLENOL ) 500 MG tablet Take 1,000 mg by mouth every 8 (eight) hours as needed for mild pain (pain score 1-3) or moderate pain (pain score 4-6).   albuterol  (VENTOLIN  HFA) 108 (90 Base) MCG/ACT inhaler Inhale 2 puffs into the lungs every 4 (four) hours as needed for wheezing or shortness of breath.   amitriptyline  (ELAVIL ) 50 MG tablet Take 1 tablet (50 mg total) by mouth at bedtime.   Ascorbic Acid  (VITAMIN C ) 1000 MG tablet Take 1,000 mg by mouth daily.   aspirin  EC 81 MG tablet Take 81 mg by mouth daily.   budesonide  (PULMICORT ) 0.5 MG/2ML nebulizer solution Take 2 mLs (0.5 mg total) by nebulization 2 (two) times daily.   buPROPion  (WELLBUTRIN  XL) 150 MG 24 hr tablet Take 1 tablet by mouth once daily   calcium  carbonate (OS-CAL - DOSED IN MG OF ELEMENTAL CALCIUM ) 1250 (500 Ca) MG tablet Take 1 tablet by mouth daily at 6 (six) AM.   calcium  carbonate (TUMS - DOSED IN MG ELEMENTAL CALCIUM ) 500 MG chewable tablet Chew 2 tablets by mouth daily as needed for indigestion or heartburn.   Cholecalciferol  (VITAMIN D ) 1000 UNITS capsule Take  2,000 Units by mouth daily.   clonazePAM  (KLONOPIN ) 0.5 MG tablet Take 1 tablet by mouth twice daily as needed   cyanocobalamin  (VITAMIN B12) 1000 MCG/ML injection Inject into the muscle.   cyclobenzaprine  (FLEXERIL ) 10 MG tablet Take 1 tablet by mouth three times daily as needed for muscle spasm   dimenhyDRINATE (DRAMAMINE) 50 MG tablet Take 50 mg by mouth every 8 (eight) hours as needed for nausea or dizziness.   donepezil  (ARICEPT ) 5 MG tablet TAKE 1 TABLET BY MOUTH AT BEDTIME   ezetimibe  (ZETIA ) 10 MG tablet Take 1 tablet by mouth once daily   fluticasone  (FLONASE) 50 MCG/ACT nasal spray Place 2 sprays into both nostrils daily as needed for rhinitis.   furosemide  (LASIX ) 20 MG tablet Take 1 tablet (20 mg total) by mouth daily as needed (for increased shortness of breath or weight gain greater than 3 pounds overnight.).   gabapentin  (NEURONTIN ) 100 MG capsule Take 100 mg by mouth.   guaiFENesin  (MUCINEX ) 600 MG 12 hr tablet Take 600 mg by mouth 2 (two) times daily as needed for to loosen phlegm.   ipratropium-albuterol  (DUONEB) 0.5-2.5 (3) MG/3ML SOLN Take 3 mLs by nebulization every 6 (six) hours as needed.   magnesium  oxide (MAG-OX) 400 (240 Mg) MG tablet Take 400 mg by mouth daily.   metoprolol  tartrate (LOPRESSOR ) 25 MG tablet Take 1/2 (one-half) tablet by mouth twice daily   Multiple Vitamin (MULTIVITAMIN) tablet Take 1 tablet by mouth daily.   naproxen  sodium (ALEVE ) 220 MG tablet Take 220 mg by mouth in the morning.   pantoprazole  (PROTONIX ) 40 MG tablet TAKE 1 TABLET BY MOUTH ONCE DAILY (TAKE  30  MINUTES  PRIOR  TO  BREAKFAST)   rosuvastatin  (CRESTOR ) 40 MG tablet Take 1 tablet by mouth once daily   Simethicone  125 MG CAPS Take 125 mg by mouth daily as needed (gas).   spironolactone  (ALDACTONE ) 25 MG tablet Take 0.5 tablets (12.5 mg total) by mouth daily.   vitamin E 180 MG (  400 UNITS) capsule Take 400 Units by mouth daily.   "

## 2024-12-21 NOTE — Patient Instructions (Signed)
 Medication Instructions:  Your physician recommends that you continue on your current medications as directed. Please refer to the Current Medication list given to you today.  *If you need a refill on your cardiac medications before your next appointment, please call your pharmacy*  Lab Work: None nededed If you have labs (blood work) drawn today and your tests are completely normal, you will receive your results only by: MyChart Message (if you have MyChart) OR A paper copy in the mail If you have any lab test that is abnormal or we need to change your treatment, we will call you to review the results.  Testing/Procedures: None needed  Follow-Up: At [facility], you and your health needs are our priority.  As part of our continuing mission to provide you with exceptional heart care, our providers are all part of one team.  This team includes your primary Cardiologist (physician) and Advanced Practice Providers or APPs (Physician Assistants and Nurse Practitioners) who all work together to provide you with the care you need, when you need it.  Your next appointment:   As scheduled on 03/16/25   Provider:   Bernardino Bring, PA-C  We recommend signing up for the patient portal called MyChart.  Sign up information is provided on this After Visit Summary.  MyChart is used to connect with patients for Virtual Visits (Telemedicine).  Patients are able to view lab/test results, encounter notes, upcoming appointments, etc.  Non-urgent messages can be sent to your provider as well.   To learn more about what you can do with MyChart, go to [email or web address].   Other Instructions

## 2024-12-22 ENCOUNTER — Encounter: Payer: Self-pay | Admitting: Oncology

## 2024-12-22 ENCOUNTER — Inpatient Hospital Stay: Attending: Oncology | Admitting: Oncology

## 2024-12-22 ENCOUNTER — Encounter: Payer: Self-pay | Admitting: *Deleted

## 2024-12-22 ENCOUNTER — Inpatient Hospital Stay

## 2024-12-22 VITALS — BP 127/59 | HR 66 | Temp 97.2°F | Resp 19 | Ht 68.0 in | Wt 229.7 lb

## 2024-12-22 DIAGNOSIS — Z170 Estrogen receptor positive status [ER+]: Secondary | ICD-10-CM

## 2024-12-22 DIAGNOSIS — C50912 Malignant neoplasm of unspecified site of left female breast: Secondary | ICD-10-CM

## 2024-12-22 DIAGNOSIS — C50212 Malignant neoplasm of upper-inner quadrant of left female breast: Secondary | ICD-10-CM | POA: Diagnosis not present

## 2024-12-22 DIAGNOSIS — Z7189 Other specified counseling: Secondary | ICD-10-CM | POA: Diagnosis not present

## 2024-12-22 LAB — GENETIC SCREENING ORDER

## 2024-12-22 NOTE — Progress Notes (Signed)
 Accompanied patient and family to initial medical oncology appointment.   Reviewed Breast Cancer treatment handbook.   Care plan summary given to patient.   Reviewed outreach programs and cancer center services.

## 2024-12-22 NOTE — Progress Notes (Unsigned)
 New patient; Breast Cancer Referred by Spencer Copland. New patient information sent via Mychart.

## 2024-12-23 DIAGNOSIS — C50912 Malignant neoplasm of unspecified site of left female breast: Secondary | ICD-10-CM

## 2024-12-23 DIAGNOSIS — C50212 Malignant neoplasm of upper-inner quadrant of left female breast: Secondary | ICD-10-CM | POA: Insufficient documentation

## 2024-12-23 HISTORY — DX: Malignant neoplasm of unspecified site of left female breast: C50.912

## 2024-12-23 NOTE — Progress Notes (Signed)
 "  Hematology/Oncology Consult note [facility] Telephone:([phone] [phone]  Patient Care Team: Watt Mirza, MD as PCP - General (Family Medicine) Darron Deatrice LABOR, MD as PCP - Cardiology (Cardiology) Wendel Lurena POUR, MD as PCP - Structural Heart (Cardiology) Georgina Shasta POUR, RN as Oncology Nurse Navigator   Name of the patient: Amanda [name]  [id number]  [birth year]    Reason for referral- new diagnosis of breast cancer   Referring physician- Dr. Watt   Date of visit: 12/23/24   History of presenting illness- ABIGAIL TEALL is a 74 year old female who presents for initial evaluation of newly diagnosed stage IA, ER-positive, PR-positive, HER2-negative invasive ductal carcinoma of the left breast.  She was found to have a 6 mm mass in the left breast at the 2:30 position on screening mammogram, with no abnormalities detected in the right breast or axillary lymph nodes by imaging. Core biopsy confirmed invasive ductal carcinoma, 3 mm grade 1. Pathology demonstrated 100% estrogen receptor positivity with strong staining, 95% progesterone receptor positivity with strong staining, and HER2 negativity. Ki 67 10%  She has not experienced breast symptoms, including pain, palpable mass, nipple discharge, or skin changes. She denies systemic symptoms such as weight loss, fevers, or night sweats. She has not received any prior treatment for this diagnosis. She stated, I'm not really anxious or nervous about it at this point because it is so early.  She was accompanied by her daughter, with her son and daughter-in-law participating by phone. Family members expressed concern regarding hereditary risk due to a family history of breast cancer in her sister and pancreatic cancer in her mother.      History of Present Illness  ECOG PS- 1  Pain scale- 0   Review of systems- Review of Systems  Constitutional:  Negative for chills, fever,  malaise/fatigue and weight loss.  HENT:  Negative for congestion, ear discharge and nosebleeds.   Eyes:  Negative for blurred vision.  Respiratory:  Negative for cough, hemoptysis, sputum production, shortness of breath and wheezing.   Cardiovascular:  Negative for chest pain, palpitations, orthopnea and claudication.  Gastrointestinal:  Negative for abdominal pain, blood in stool, constipation, diarrhea, heartburn, melena, nausea and vomiting.  Genitourinary:  Negative for dysuria, flank pain, frequency, hematuria and urgency.  Musculoskeletal:  Negative for back pain, joint pain and myalgias.  Skin:  Negative for rash.  Neurological:  Negative for dizziness, tingling, focal weakness, seizures, weakness and headaches.  Endo/Heme/Allergies:  Does not bruise/bleed easily.  Psychiatric/Behavioral:  Negative for depression and suicidal ideas. The patient does not have insomnia.     Allergies[1]  Patient Active Problem List   Diagnosis Date Noted   GAD (generalized anxiety disorder)    RBBB 12/21/2023   S/P TAVR (transcatheter aortic valve replacement) 12/21/2023   CAD (coronary artery disease), native coronary artery 11/26/2020   Aortic valve stenosis    COPD with chronic bronchitis (HCC) 10/30/2016   Gastroesophageal reflux disease 09/17/2016   Smoker 05/10/2015   Solitary pulmonary nodule 03/28/2013   Essential hypertension    Vitamin D  deficiency 05/29/2009   Pure hypercholesterolemia 12/07/2007   Major depressive disorder, recurrent episode, in partial remission 12/07/2007   Controlled type 2 diabetes mellitus with diabetic autonomic neuropathy, without long-term current use of insulin  (HCC) 08/10/2007     Past Medical History:  Diagnosis Date   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 sting    Aortic stenosis    a. 2013: nl LV sys fxn, mild MR, no evidence  of pulm htn; b. TTE 8/17: EF 60-65%, no RWMA, nl LV dia fxn,  mild AS, mod AI   CAD (coronary artery disease), native coronary artery 11/26/2020    Controlled type 2 diabetes mellitus with diabetic autonomic neuropathy, without long-term current use of insulin  (HCC) 08/10/2007   COPD (chronic obstructive pulmonary disease) (HCC) 10/30/2016   Depression    GAD (generalized anxiety disorder)    GERD (gastroesophageal reflux disease)    Hiatal hernia with gastroesophageal reflux 1997   Hyperlipidemia    Hypertension    Osteoporosis    S/P TAVR (transcatheter aortic valve replacement) 12/21/2023   s/p TAVR with a 34 mm Evolut FX via the TF approach by Dr. Wendel & Dr. Maryjane   Tobacco abuse      Past Surgical History:  Procedure Laterality Date   ABDOMINAL HYSTERECTOMY  1993   ANTERIOR CERVICAL DECOMP/DISCECTOMY FUSION N/A 10/26/2018   Procedure: ACDF C4-C5 C5-C6 C6-C7;  Surgeon: Onetha Kuba, MD;  Location: [facility] OR;  Service: Neurosurgery;  Laterality: N/A;   APPENDECTOMY     BACK SURGERY     BREAST BIOPSY Right 2007   benign   BREAST BIOPSY Left 12/06/2024   US  LT BREAST BX W LOC DEV 1ST LESION IMG BX SPEC US  GUIDE 12/06/2024 [facility]   BREAST BIOPSY Left 12/13/2024   MM LT BREAST BX W LOC DEV 1ST LESION IMAGE BX SPEC STEREO GUIDE 12/13/2024 [facility]   CHOLECYSTECTOMY     DILATION AND CURETTAGE OF UTERUS     ESOPHAGEAL DILATION     x 3   ESOPHAGOGASTRODUODENOSCOPY (EGD) WITH PROPOFOL  N/A 02/06/2022   Procedure: ESOPHAGOGASTRODUODENOSCOPY (EGD) WITH PROPOFOL ;  Surgeon: Therisa Bi, MD;  Location: [facility] ENDOSCOPY;  Service: Gastroenterology;  Laterality: N/A;   ESOPHAGOGASTRODUODENOSCOPY (EGD) WITH PROPOFOL  N/A 02/26/2022   Procedure: ESOPHAGOGASTRODUODENOSCOPY (EGD) WITH PROPOFOL ;  Surgeon: Therisa Bi, MD;  Location: [facility] ENDOSCOPY;  Service: Gastroenterology;  Laterality: N/A;  EGD+dilation per Dr. Therisa   INCISION AND DRAINAGE Right 12/02/2021   Procedure: INCISION AND DRAINAGE;  Surgeon: Kathlynn Sharper, MD;  Location: [facility] ORS;  Service: Orthopedics;  Laterality: Right;   INTRAOPERATIVE TRANSTHORACIC  ECHOCARDIOGRAM N/A 12/21/2023   Procedure: INTRAOPERATIVE TRANSTHORACIC ECHOCARDIOGRAM;  Surgeon: Wendel Lurena POUR, MD;  Location: [facility] INVASIVE CV LAB;  Service: Cardiovascular;  Laterality: N/A;   LEFT HEART CATH AND CORONARY ANGIOGRAPHY N/A 11/11/2020   Procedure: LEFT HEART CATH AND CORONARY ANGIOGRAPHY;  Surgeon: Darron Deatrice LABOR, MD;  Location: [facility] INVASIVE CV LAB;  Service: Cardiovascular;  Laterality: N/A;   OOPHORECTOMY     OVARIAN CYST REMOVAL  1972   RIGHT/LEFT HEART CATH AND CORONARY ANGIOGRAPHY Bilateral 10/04/2023   Procedure: RIGHT/LEFT HEART CATH AND CORONARY ANGIOGRAPHY;  Surgeon: Darron Deatrice LABOR, MD;  Location: [facility] INVASIVE CV LAB;  Service: Cardiovascular;  Laterality: Bilateral;   SALIVARY GLAND SURGERY     TONSILLECTOMY     4 yoa   TOTAL KNEE ARTHROPLASTY Left 12/24/2015   Procedure: LEFT TOTAL KNEE ARTHROPLASTY;  Surgeon: Glendia Cordella Hutchinson, MD;  Location: [facility] OR;  Service: Orthopedics;  Laterality: Left;   TOTAL KNEE ARTHROPLASTY Right 12/30/2017   Procedure: RIGHT TOTAL KNEE ARTHROPLASTY;  Surgeon: Hutchinson Cordella Glendia, MD;  Location: [facility] OR;  Service: Orthopedics;  Laterality: Right;   WRIST FRACTURE SURGERY  11/2008    Social History   Socioeconomic History   Marital status: Widowed    Spouse name: Not on file   Number of children: 2   Years of education: Not on file  Highest education level: Some college, no degree  Occupational History   Occupation: Insurance Claims Handler: [employer]  Tobacco Use   Smoking status: Former    Average packs/day: 0.8 packs/day for 45.0 years (33.8 ttl pk-yrs)    Types: Cigarettes    Start date: 01/2023   Smokeless tobacco: Never   Tobacco comments:    Does not on starting back smoking.  Vaping Use   Vaping status: Never Used  Substance and Sexual Activity   Alcohol use: Yes    Alcohol/week: 0.0 standard drinks of alcohol    Comment: glass of wine twice a month    Drug use: No    Sexual activity: Not Currently  Other Topics Concern   Not on file  Social History Narrative   Not on file   Social Drivers of Health   Tobacco Use: Medium Risk (12/22/2024)   Patient History    Smoking Tobacco Use: Former    Smokeless Tobacco Use: Never    Passive Exposure: Not on file  Financial Resource Strain: High Risk (02/21/2024)   Overall Financial Resource Strain (CARDIA)    Difficulty of Paying Living Expenses: Hard  Food Insecurity: Food Insecurity Present (12/20/2024)   Epic    Worried About Programme Researcher, Broadcasting/film/video in the Last Year: Sometimes true    Ran Out of Food in the Last Year: Sometimes true  Transportation Needs: No Transportation Needs (12/20/2024)   Epic    Lack of Transportation (Medical): No    Lack of Transportation (Non-Medical): No  Physical Activity: Unknown (02/21/2024)   Exercise Vital Sign    Days of Exercise per Week: 0 days    Minutes of Exercise per Session: Not on file  Stress: Stress Concern Present (02/21/2024)   [employer] of Occupational Health - Occupational Stress Questionnaire    Feeling of Stress : Very much  Social Connections: Moderately Isolated (03/29/2024)   Social Connection and Isolation Panel    Frequency of Communication with Friends and Family: Twice a week    Frequency of Social Gatherings with Friends and Family: Once a week    Attends Religious Services: 1 to 4 times per year    Active Member of [employer] or Organizations: No    Attends Banker Meetings: Patient declined    Marital Status: Widowed  Intimate Partner Violence: Not At Risk (03/29/2024)   Humiliation, Afraid, Rape, and Kick questionnaire    Fear of Current or Ex-Partner: No    Emotionally Abused: No    Physically Abused: No    Sexually Abused: No  Depression (PHQ2-9): Low Risk (12/22/2024)   Depression (PHQ2-9)    PHQ-2 Score: 0  Alcohol Screen: Low Risk (02/21/2024)   Alcohol Screen    Last Alcohol Screening Score (AUDIT): 1  Housing: Low Risk  (12/20/2024)   Epic    Unable to Pay for Housing in the Last Year: No    Number of Times Moved in the Last Year: 0    Homeless in the Last Year: No  Utilities: Not At Risk (12/20/2024)   Epic    Threatened with loss of utilities: No  Health Literacy: Not on file     Family History  Problem Relation Age of Onset   Hyperlipidemia Mother    Arthritis Mother    Diabetes Mother    Heart disease Mother    Colon polyps Mother    Pancreatic cancer Mother    Hypercholesterolemia Sister  Colon polyps Sister    Breast cancer Sister    Lung cancer Maternal Aunt    Skin cancer Maternal Uncle    Cancer Paternal Grandmother        breast   Colon cancer Neg Hx    Esophageal cancer Neg Hx    Rectal cancer Neg Hx    Stomach cancer Neg Hx     Current Medications[2]   Physical exam:  Vitals:   12/22/24 1455  BP: (!) 127/59  Pulse: 66  Resp: 19  Temp: (!) 97.2 F (36.2 C)  TempSrc: Tympanic  SpO2: 97%  Weight: 229 lb 11.2 oz (104.2 kg)  Height: 5' 8 (1.727 m)   Physical Exam Cardiovascular:     Rate and Rhythm: Normal rate and regular rhythm.     Heart sounds: Normal heart sounds.  Pulmonary:     Effort: Pulmonary effort is normal.     Breath sounds: Normal breath sounds.  Abdominal:     General: Bowel sounds are normal.     Palpations: Abdomen is soft.  Skin:    General: Skin is warm and dry.  Neurological:     Mental Status: She is alert and oriented to person, place, and time.    Breast exam: no palpable breast masses in either breast. No palpable b/l axillary adenopathy. Prior scar of right breast biopsy seen       Latest Ref Rng & Units 07/05/2024    2:14 PM  CMP  Glucose 70 - 99 mg/dL 847   BUN 8 - 27 mg/dL 11   Creatinine 9.42 - 1.00 mg/dL 9.13   Sodium 865 - 855 mmol/L 136   Potassium 3.5 - 5.2 mmol/L 4.1   Chloride 96 - 106 mmol/L 101   CO2 20 - 29 mmol/L 21   Calcium  8.7 - 10.3 mg/dL 9.2       Latest Ref Rng & Units 04/28/2024    4:03 PM  CBC   WBC 3.4 - 10.8 x10E3/uL 7.7   Hemoglobin 11.1 - 15.9 g/dL 86.4   Hematocrit 65.9 - 46.6 % 40.7   Platelets 150 - 450 x10E3/uL 194     No images are attached to the encounter.  ECHOCARDIOGRAM COMPLETE Result Date: 12/21/2024    ECHOCARDIOGRAM REPORT   Patient Name:   KATLEN SEYER Hur Date of Exam: 12/21/2024 Medical Rec #:  [id number]        Height:       68.0 in Accession #:    [id number]       Weight:       232.4 lb Date of Birth:  July 20, 1951        BSA:          2.178 m Patient Age:    73 years         BP:           120/65 mmHg Patient Gender: F                HR:           66 bpm. Exam Location:  [address] Procedure: 2D Echo and Strain Analysis (Both Spectral and Color Flow Doppler            were utilized during procedure). Indications:    I35.0 aortic stenosis  History:        Patient has prior history of Echocardiogram examinations. COPD,  Aortic Valve Disease, Arrythmias:RBBB; Risk                 Factors:Hypertension, Former Smoker, Dyslipidemia and Diabetes.                 Previous echo revealed LVEF 65% mean AV gradient 6.3 mmhg.                 Aortic Valve: 34 Evolut FX valve is present in the aortic                 position. Procedure Date: 12/21/2023.  Sonographer:    Nolon Berg [facility], RDCS Referring Phys: [id number] LAMARR SAUNDERS THOMPSON IMPRESSIONS  1. Left ventricular ejection fraction, by estimation, is 60 to 65%. The left ventricle has normal function. The left ventricle has no regional wall motion abnormalities. There is mild concentric left ventricular hypertrophy. Left ventricular diastolic parameters are consistent with Grade I diastolic dysfunction (impaired relaxation). Elevated left ventricular end-diastolic pressure. The average left ventricular global longitudinal strain is -17.4 %.  2. Right ventricular systolic function is normal. The right ventricular size is normal. Tricuspid regurgitation signal is inadequate for assessing PA pressure.  3. The mitral valve is  normal in structure. No evidence of mitral valve regurgitation. No evidence of mitral stenosis.  4. Well seated bioprosthetic valve. The aortic valve has been repaired/replaced. Aortic valve regurgitation is not visualized. No aortic stenosis is present. There is a 34 Evolut FX valve present in the aortic position. Procedure Date: 12/21/2023. Aortic  valve mean gradient measures 9.0 mmHg. Aortic valve Vmax measures 2.15 m/s.  5. The inferior vena cava is normal in size with greater than 50% respiratory variability, suggesting right atrial pressure of 3 mmHg. FINDINGS  Left Ventricle: Left ventricular ejection fraction, by estimation, is 60 to 65%. The left ventricle has normal function. The left ventricle has no regional wall motion abnormalities. The average left ventricular global longitudinal strain is -17.4 %. The left ventricular internal cavity size was normal in size. There is mild concentric left ventricular hypertrophy. Left ventricular diastolic parameters are consistent with Grade I diastolic dysfunction (impaired relaxation). Elevated left ventricular end-diastolic pressure. Right Ventricle: The right ventricular size is normal. No increase in right ventricular wall thickness. Right ventricular systolic function is normal. Tricuspid regurgitation signal is inadequate for assessing PA pressure. Left Atrium: Left atrial size was normal in size. Right Atrium: Right atrial size was normal in size. Pericardium: There is no evidence of pericardial effusion. Mitral Valve: The mitral valve is normal in structure. Mild mitral annular calcification. No evidence of mitral valve regurgitation. No evidence of mitral valve stenosis. Tricuspid Valve: The tricuspid valve is normal in structure. Tricuspid valve regurgitation is not demonstrated. No evidence of tricuspid stenosis. Aortic Valve: Well seated bioprosthetic valve. The aortic valve has been repaired/replaced. Aortic valve regurgitation is not visualized. No  aortic stenosis is present. Aortic valve mean gradient measures 9.0 mmHg. Aortic valve peak gradient measures 18.5 mmHg. There is a 34 Evolut FX valve present in the aortic position. Procedure Date: 12/21/2023. Pulmonic Valve: The pulmonic valve was normal in structure. Pulmonic valve regurgitation is not visualized. No evidence of pulmonic stenosis. Aorta: The aortic root is normal in size and structure. Venous: The inferior vena cava is normal in size with greater than 50% respiratory variability, suggesting right atrial pressure of 3 mmHg. IAS/Shunts: No atrial level shunt detected by color flow Doppler.  LEFT VENTRICLE PLAX 2D LVIDd:  4.30 cm   Diastology LVIDs:         2.20 cm   LV e' medial:    3.81 cm/s LV PW:         1.00 cm   LV E/e' medial:  25.4 LV IVS:        1.20 cm   LV e' lateral:   8.75 cm/s LVOT diam:     2.40 cm   LV E/e' lateral: 11.0 LV SV:         113 LV SV Index:   52        2D Longitudinal Strain LVOT Area:     4.52 cm  2D Strain GLS (A4C):   -14.6 %                          2D Strain GLS (A3C):   -20.3 %                          2D Strain GLS (A2C):   -17.4 %                          2D Strain GLS Avg:     -17.4 % RIGHT VENTRICLE            IVC RV Basal diam:  3.80 cm    IVC diam: 2.20 cm RV S prime:     9.46 cm/s TAPSE (M-mode): 2.5 cm     PULMONARY VEINS                            A Reversal Velocity: 23.40 cm/s                            Diastolic Velocity:  36.10 cm/s                            S/D Velocity:        1.30                            Systolic Velocity:   46.70 cm/s LEFT ATRIUM             Index        RIGHT ATRIUM           Index LA diam:        4.30 cm 1.97 cm/m   RA Area:     14.00 cm LA Vol (A2C):   53.8 ml 24.70 ml/m  RA Volume:   35.90 ml  16.48 ml/m LA Vol (A4C):   53.7 ml 24.65 ml/m LA Biplane Vol: 54.0 ml 24.79 ml/m  AORTIC VALVE AV Area (Vmax):  2.15 cm AV Area (Vmean): 2.50 cm AV Vmax:         215.00 cm/s AV Vmean:        126.000 cm/s AV Peak Grad:     18.5 mmHg AV Mean Grad:    9.0 mmHg LVOT Vmax:       102.00 cm/s LVOT Vmean:      69.700 cm/s LVOT VTI:        0.250 m  AORTA Ao Asc diam: 2.90 cm MITRAL VALVE MV Area (PHT): 2.46 cm     SHUNTS  MV Decel Time: 309 msec     Systemic VTI:  0.25 m MV E velocity: 96.60 cm/s   Systemic Diam: 2.40 cm MV A velocity: 126.00 cm/s MV E/A ratio:  0.77 Kardie Tobb DO Electronically signed by Dub Huntsman DO Signature Date/Time: 12/21/2024/6:00:57 PM    Final    MM LT BREAST BX W LOC DEV 1ST LESION IMAGE BX SPEC STEREO GUIDE Addendum Date: 12/15/2024 ADDENDUM REPORT: 12/15/2024 15:04 ADDENDUM: PATHOLOGY revealed: Breast, left, needle core biopsy, Upper outer mass middle depth, (coil clip)- INVASIVE DUCTAL CARCINOMA- OVERALL GRADE: I/III - LYMPHOVASCULAR INVASION: NOT IDENTIFIED - CANCER LENGTH: 3 MM IN GREATEST LINEAR DIMENSION - CALCIFICATIONS: NOT IDENTIFIED - OTHER FINDINGS: ADJACENT SCLEROSING ADENOSIS Pathology results are CONCORDANT with imaging findings, per Dr. Corean Salter. Pathology results and recommendations below were discussed with patient by telephone. Patient reported biopsy site doing well with no adverse symptoms, and only slight tenderness at the site. Post biopsy care instructions were reviewed, questions were answered and my direct phone number was provided. Patient was instructed to call [facility] for any additional questions or concerns related to biopsy site. RECOMMENDATIONS: Surgical and oncological consultation. Request for surgical and oncological consultation was relayed to Shasta Ada, Nurse Navigator at [facility]. Pathology results reported by Mliss CHARM Molt RN 12/15/2024. Electronically Signed   By: Corean Salter M.D.   On: 12/15/2024 15:04   Result Date: 12/15/2024 CLINICAL DATA:  Indeterminate LEFT breast asymmetry/mass EXAM: LEFT BREAST STEREOTACTIC CORE NEEDLE BIOPSY COMPARISON:  Previous exam(s). FINDINGS: The patient and I discussed the procedure of  stereotactic-guided biopsy including benefits and alternatives. We discussed the high likelihood of a successful procedure. We discussed the risks of the procedure including infection, bleeding, tissue injury, clip migration, and inadequate sampling. Informed written consent was given. The usual time out protocol was performed immediately prior to the procedure. Using sterile technique and 1% lidocaine  and 1% lidocaine  with epinephrine  as local anesthetic, under stereotactic guidance, a 9 gauge vacuum assisted device was used to perform core needle biopsy of an asymmetry/mass in the upper outer quadrant of the left breast using a superior approach. Lesion quadrant: Upper outer quadrant At the conclusion of the procedure, a COIL shaped tissue marker clip was deployed into the biopsy cavity. Follow-up 2-view mammogram was performed and dictated separately. IMPRESSION: Stereotactic-guided biopsy of a LEFT breast asymmetry/mass. No apparent complications. Electronically Signed: By: Corean Salter M.D. On: 12/13/2024 14:15   MM CLIP PLACEMENT LEFT Result Date: 12/13/2024 CLINICAL DATA:  Status post stereotactic guided biopsy EXAM: 3D DIAGNOSTIC LEFT MAMMOGRAM POST STEREOTACTIC BIOPSY COMPARISON:  Previous exam(s). ACR Breast Density Category b: There are scattered areas of fibroglandular density. FINDINGS: 3D Mammographic images were obtained following stereotactic guided biopsy of a LEFT breast asymmetry/mass. The COIL shaped biopsy marking clip is in the expected region of the site of biopsy; it may be slightly posteriorly displaced from site of biopsy but area is obscured by post biopsy changes. IMPRESSION: Overall favored appropriate positioning of the COIL shaped biopsy marking clip at the site of biopsy in the upper outer breast at middle depth. Should localization be necessary, recommend repeating CC and ML imaging prior to localization to assess for resolution of post biopsy changes. Final Assessment: Post  Procedure Mammograms for Marker Placement Electronically Signed   By: Corean Salter M.D.   On: 12/13/2024 14:10   US  LT BREAST BX W LOC DEV 1ST LESION IMG BX SPEC US  GUIDE Addendum Date: 12/11/2024 ADDENDUM REPORT:  12/11/2024 12:53 ADDENDUM: PATHOLOGY revealed: Breast, left, needle core biopsy, 2:30 4 cmfn, 6 mm, ribbon clip- BENIGN BREAST SHOWING FIBROCYSTIC CHANGES INCLUDING STROMAL FIBROSIS, CYSTIC DILATATION OF DUCTS AND FOCAL ADENOSIS FRAGMENTS OF CYST WALL- MICROCALCIFICATIONS PRESENT WITHIN CYSTIC DUCTS, ADENOSIS AND BENIGN DUCTULES- NEGATIVE FOR ATYPIA AND CARCINOMA Pathology results are CONCORDANT with imaging findings, per Dr. Norleen Croak. Pathology results and recommendations below were discussed with patient by telephone. Patient reported biopsy site doing well with no adverse symptoms, and only slight tenderness at the site. Post biopsy care instructions were reviewed, questions were answered and my direct phone number was provided. Patient was instructed to call [facility] for any additional questions or concerns related to biopsy site. RECOMMENDATION: Patient to return for left breast stereotactic guided biopsy of the mammographic abnormality which did not correlate with the US  finding. Patient aware that this appointment has been scheduled for 12/13/2024 at [facility]. Pathology results reported by Mliss CHARM Molt RN 12/08/2024. Electronically Signed   By: Norleen Croak M.D.   On: 12/11/2024 12:53   Result Date: 12/11/2024 CLINICAL DATA:  Indeterminate LEFT breast mass EXAM: ULTRASOUND GUIDED LEFT BREAST CORE NEEDLE BIOPSY COMPARISON:  Previous exam(s). PROCEDURE: I met with the patient and we discussed the procedure of ultrasound-guided biopsy, including benefits and alternatives. We discussed the high likelihood of a successful procedure. We discussed the risks of the procedure, including infection, bleeding, tissue injury, clip migration, and inadequate sampling.  Informed written consent was given. The usual time-out protocol was performed immediately prior to the procedure. Lesion quadrant: Upper-outer Using sterile technique and 1% lidocaine  and 1% lidocaine  with epinephrine  as local anesthetic, under direct ultrasound visualization, a 14 gauge spring-loaded device was used to perform biopsy of the LEFT breast mass at 2:30 o'clock 4 cm from the nipple using a LATERAL approach. At the conclusion of the procedure ribbon shaped tissue marker clip was deployed into the biopsy cavity. Follow up 2 view mammogram was performed and dictated separately. IMPRESSION: Ultrasound guided biopsy of the LEFT breast mass at 2:30 o'clock 4 cm from the nipple. No apparent complications. Electronically Signed: By: Norleen Croak M.D. On: 12/06/2024 13:34   MM CLIP PLACEMENT LEFT Result Date: 12/06/2024 CLINICAL DATA:  Status post LEFT breast ultrasound-guided biopsy EXAM: 3D DIAGNOSTIC LEFT MAMMOGRAM POST ULTRASOUND BIOPSY COMPARISON:  Previous exam(s). ACR Breast Density Category b: There are scattered areas of fibroglandular density. FINDINGS: 3D Mammographic images were obtained following ultrasound guided biopsy of the LEFT breast mass at 2:30 o'clock 4 cm from the nipple. The biopsy marking clip is in expected position at the site of biopsy. However, the abnormality does not correlate with the mammographic finding. IMPRESSION: Appropriate positioning of the ribbon shaped biopsy marking clip at the site of biopsy in the LEFT breast. However, the sonographic abnormality did not correlate with the mammographic finding. Stereotactic biopsy is recommended and will be scheduled. Final Assessment: Post Procedure Mammograms for Marker Placement Electronically Signed   By: Norleen Croak M.D.   On: 12/06/2024 13:40   MM 3D DIAGNOSTIC MAMMOGRAM UNILATERAL LEFT BREAST Result Date: 11/27/2024 CLINICAL DATA:  Screening recall LEFT breast focal asymmetry EXAM: DIGITAL DIAGNOSTIC UNILATERAL LEFT  MAMMOGRAM WITH TOMOSYNTHESIS AND CAD; ULTRASOUND LEFT BREAST LIMITED TECHNIQUE: Left digital diagnostic mammography and breast tomosynthesis was performed. The images were evaluated with computer-aided detection. ; Targeted ultrasound examination of the left breast was performed. COMPARISON:  Previous exam(s). ACR Breast Density Category b: There are scattered areas of fibroglandular density.  FINDINGS: Full field lateral and spot compression view(s) were performed and demonstrate a persistent 5 mm focal asymmetry with possible subtle distortion in the upper-outer quadrant of the LEFT breast, middle depth. It is best appreciated on the CC view. Ultrasound was performed. At the LEFT breast 2:30 o'clock position 4 cm from the nipple there is a 6 x 2 x 5 mm oval mass with angular margins which likely correlates with the mammographic finding. IMPRESSION: 1. 6 mm LEFT breast mass at the 2:30 o'clock position likely correlates with the mammographic focal asymmetry with possible subtle distortion. Ultrasound-guided biopsy is recommended. 2. If the above mass does not correlate with the mammographic finding, stereotactic biopsy should be performed. RECOMMENDATION: Ultrasound-guided biopsy of the LEFT breast x1. If the sonographic abnormality does not correlate with the mammographic finding, the patient will need to return for stereotactic biopsy. I have discussed the findings and recommendations with the patient. The recommended procedure was discussed with the patient and questions were answered. Patient expressed their understanding of the recommendation. Patient will be scheduled for the procedure at her earliest convenience by the schedulers. Ordering provider will be notified. If applicable, a reminder letter will be sent to the patient regarding the next appointment. BI-RADS CATEGORY  4: Suspicious. Electronically Signed   By: Norleen Croak M.D.   On: 11/27/2024 16:19   US  LIMITED ULTRASOUND INCLUDING AXILLA LEFT  BREAST  Result Date: 11/27/2024 CLINICAL DATA:  Screening recall LEFT breast focal asymmetry EXAM: DIGITAL DIAGNOSTIC UNILATERAL LEFT MAMMOGRAM WITH TOMOSYNTHESIS AND CAD; ULTRASOUND LEFT BREAST LIMITED TECHNIQUE: Left digital diagnostic mammography and breast tomosynthesis was performed. The images were evaluated with computer-aided detection. ; Targeted ultrasound examination of the left breast was performed. COMPARISON:  Previous exam(s). ACR Breast Density Category b: There are scattered areas of fibroglandular density. FINDINGS: Full field lateral and spot compression view(s) were performed and demonstrate a persistent 5 mm focal asymmetry with possible subtle distortion in the upper-outer quadrant of the LEFT breast, middle depth. It is best appreciated on the CC view. Ultrasound was performed. At the LEFT breast 2:30 o'clock position 4 cm from the nipple there is a 6 x 2 x 5 mm oval mass with angular margins which likely correlates with the mammographic finding. IMPRESSION: 1. 6 mm LEFT breast mass at the 2:30 o'clock position likely correlates with the mammographic focal asymmetry with possible subtle distortion. Ultrasound-guided biopsy is recommended. 2. If the above mass does not correlate with the mammographic finding, stereotactic biopsy should be performed. RECOMMENDATION: Ultrasound-guided biopsy of the LEFT breast x1. If the sonographic abnormality does not correlate with the mammographic finding, the patient will need to return for stereotactic biopsy. I have discussed the findings and recommendations with the patient. The recommended procedure was discussed with the patient and questions were answered. Patient expressed their understanding of the recommendation. Patient will be scheduled for the procedure at her earliest convenience by the schedulers. Ordering provider will be notified. If applicable, a reminder letter will be sent to the patient regarding the next appointment. BI-RADS CATEGORY   4: Suspicious. Electronically Signed   By: Norleen Croak M.D.   On: 11/27/2024 16:19    Assessment and plan- Patient is a 74 y.o. female with newly diagnosed Stage IA invasive mammary carcinom aof the left breast cT1bN0M0 ER PR positive her 2 negative here to discuss further management  Assessment and Plan    Stage I (T1bN0M0), grade 1, ER/PR positive, HER2 negative invasive ductal carcinoma of the left breast  Newly diagnosed early-stage, low-grade, strongly hormone receptor positive invasive ductal carcinoma without lymph node involvement or metastasis. Overall good prognosis. Adjuvant antiestrogen therapy appropriate. Sentinel lymph node biopsy omission guideline-supported for small, low-grade tumors but defer decision to Dr. Marinda -  - Deferred sentinel lymph node biopsy decision to breast surgeon. - Referred to radiation oncology for adjuvant radiation therapy post-surgery. - Discussed adjuvant antiestrogen therapy (oral antiestrogen for 5 years) post-surgery and radiation, including potential side effects and biennial bone health monitoring if initiated. - Oncotype testing will only be indicated if final pathology shows tumor size >1cm grade 1 or >5 mm grade 2 or 3. Patient highly unlikely to require adjuvant chemotherapy  Assessment of hereditary breast cancer risk and genetic counseling Personal and family history suggest possible hereditary cancer syndrome, such as BRCA1/2 mutation. Genetic testing indicated to clarify risk. - Ordered comprehensive hereditary cancer genetic panel (blood test) for mutations including BRCA1/2. - Provided genetic counseling on implications of positive results, including increased risk for breast and ovarian cancer and need for cascade testing in family members.         Cancer Staging  Malignant neoplasm of upper-inner quadrant of left breast in female, estrogen receptor positive (HCC) Staging form: Breast, AJCC 8th Edition - Clinical stage from  12/23/2024: Stage IA (cT1b, cN0(f), cM0, G1, ER+, PR+, HER2-) - Signed by Melanee Annah BROCKS, MD on 12/23/2024 Stage prefix: Initial diagnosis Method of lymph node assessment: Core biopsy Histologic grading system: 3 grade system   Thank you for this kind referral and the opportunity to participate in the care of this  Patient   Visit Diagnosis 1. Malignant neoplasm of upper-inner quadrant of left breast in female, estrogen receptor positive (HCC)   2. Goals of care, counseling/discussion     Dr. Annah Melanee, MD, MPH [facility] at [facility] [phone] 12/23/2024                   [1]  Allergies Allergen Reactions   Honey Bee Venom [Bee Venom] Anaphylaxis   Diazepam Other (See Comments)    REACTION: pt states she gets very angry and abusive verbally on this medication   [2]  Current Outpatient Medications:    acetaminophen  (TYLENOL ) 500 MG tablet, Take 1,000 mg by mouth every 8 (eight) hours as needed for mild pain (pain score 1-3) or moderate pain (pain score 4-6)., Disp: , Rfl:    albuterol  (VENTOLIN  HFA) 108 (90 Base) MCG/ACT inhaler, Inhale 2 puffs into the lungs every 4 (four) hours as needed for wheezing or shortness of breath., Disp: 3 each, Rfl: 2   amitriptyline  (ELAVIL ) 50 MG tablet, Take 1 tablet (50 mg total) by mouth at bedtime., Disp: 90 tablet, Rfl: 1   Ascorbic Acid  (VITAMIN C ) 1000 MG tablet, Take 1,000 mg by mouth daily., Disp: , Rfl:    aspirin  EC 81 MG tablet, Take 81 mg by mouth daily., Disp: , Rfl:    budesonide  (PULMICORT ) 0.5 MG/2ML nebulizer solution, Take 2 mLs (0.5 mg total) by nebulization 2 (two) times daily., Disp: 360 mL, Rfl: 3   buPROPion  (WELLBUTRIN  XL) 150 MG 24 hr tablet, Take 1 tablet by mouth once daily, Disp: 90 tablet, Rfl: 0   calcium  carbonate (OS-CAL - DOSED IN MG OF ELEMENTAL CALCIUM ) 1250 (500 Ca) MG tablet, Take 1 tablet by mouth daily at 6 (six) AM., Disp: , Rfl:    calcium  carbonate (TUMS - DOSED IN MG  ELEMENTAL CALCIUM ) 500 MG chewable tablet, Chew 2  tablets by mouth daily as needed for indigestion or heartburn., Disp: , Rfl:    Cholecalciferol  (VITAMIN D ) 1000 UNITS capsule, Take 2,000 Units by mouth daily., Disp: , Rfl:    clonazePAM  (KLONOPIN ) 0.5 MG tablet, Take 1 tablet by mouth twice daily as needed, Disp: 60 tablet, Rfl: 5   cyanocobalamin  (VITAMIN B12) 1000 MCG/ML injection, Inject into the muscle., Disp: , Rfl:    cyclobenzaprine  (FLEXERIL ) 10 MG tablet, Take 1 tablet by mouth three times daily as needed for muscle spasm, Disp: 60 tablet, Rfl: 3   dimenhyDRINATE (DRAMAMINE) 50 MG tablet, Take 50 mg by mouth every 8 (eight) hours as needed for nausea or dizziness., Disp: , Rfl:    donepezil  (ARICEPT ) 5 MG tablet, TAKE 1 TABLET BY MOUTH AT BEDTIME, Disp: 90 tablet, Rfl: 3   ezetimibe  (ZETIA ) 10 MG tablet, Take 1 tablet by mouth once daily, Disp: 90 tablet, Rfl: 3   fluticasone  (FLONASE) 50 MCG/ACT nasal spray, Place 2 sprays into both nostrils daily as needed for rhinitis., Disp: , Rfl:    furosemide  (LASIX ) 20 MG tablet, Take 1 tablet (20 mg total) by mouth daily as needed (for increased shortness of breath or weight gain greater than 3 pounds overnight.)., Disp: 45 tablet, Rfl: 3   gabapentin  (NEURONTIN ) 100 MG capsule, Take 100 mg by mouth., Disp: , Rfl:    guaiFENesin  (MUCINEX ) 600 MG 12 hr tablet, Take 600 mg by mouth 2 (two) times daily as needed for to loosen phlegm., Disp: , Rfl:    ipratropium-albuterol  (DUONEB) 0.5-2.5 (3) MG/3ML SOLN, Take 3 mLs by nebulization every 6 (six) hours as needed., Disp: 360 mL, Rfl: 3   magnesium  oxide (MAG-OX) 400 (240 Mg) MG tablet, Take 400 mg by mouth daily., Disp: , Rfl:    metoprolol  tartrate (LOPRESSOR ) 25 MG tablet, Take 1/2 (one-half) tablet by mouth twice daily, Disp: 90 tablet, Rfl: 3   Multiple Vitamin (MULTIVITAMIN) tablet, Take 1 tablet by mouth daily., Disp: , Rfl:    naproxen  sodium (ALEVE ) 220 MG tablet, Take 220 mg by mouth in the  morning., Disp: , Rfl:    pantoprazole  (PROTONIX ) 40 MG tablet, TAKE 1 TABLET BY MOUTH ONCE DAILY (TAKE  30  MINUTES  PRIOR  TO  BREAKFAST), Disp: 90 tablet, Rfl: 0   rosuvastatin  (CRESTOR ) 40 MG tablet, Take 1 tablet by mouth once daily, Disp: 90 tablet, Rfl: 3   Simethicone  125 MG CAPS, Take 125 mg by mouth daily as needed (gas)., Disp: , Rfl:    spironolactone  (ALDACTONE ) 25 MG tablet, Take 0.5 tablets (12.5 mg total) by mouth daily., Disp: 45 tablet, Rfl: 3   traZODone  (DESYREL ) 50 MG tablet, Take 50 mg by mouth at bedtime as needed for sleep., Disp: , Rfl:    vitamin E 180 MG (400 UNITS) capsule, Take 400 Units by mouth daily., Disp: , Rfl:    FLUoxetine  (PROZAC ) 40 MG capsule, Take 80 mg by mouth M, W, F and 40 mg by mouth Tues, Thurs, Sat and Sunday (Patient not taking: Reported on 10/13/2024), Disp: 102 capsule, Rfl: 1   lisinopril  (ZESTRIL ) 5 MG tablet, Take 5 mg by mouth daily. (Patient not taking: Reported on 12/21/2024), Disp: , Rfl:    predniSONE  (DELTASONE ) 20 MG tablet, 2 tabs po for 4 days, then 1 tab po for 4 days (Patient not taking: Reported on 12/21/2024), Disp: 12 tablet, Rfl: 0  "

## 2024-12-27 ENCOUNTER — Inpatient Hospital Stay: Admitting: Hospice and Palliative Medicine

## 2024-12-27 ENCOUNTER — Ambulatory Visit: Admitting: Podiatry

## 2024-12-27 ENCOUNTER — Encounter: Payer: Self-pay | Admitting: Occupational Therapy

## 2024-12-27 ENCOUNTER — Ambulatory Visit: Attending: Oncology | Admitting: Occupational Therapy

## 2024-12-27 DIAGNOSIS — R293 Abnormal posture: Secondary | ICD-10-CM | POA: Insufficient documentation

## 2024-12-27 DIAGNOSIS — C50912 Malignant neoplasm of unspecified site of left female breast: Secondary | ICD-10-CM

## 2024-12-27 NOTE — Progress Notes (Signed)
 Multidisciplinary Oncology Council Documentation  Amanda [name] was presented by our [facility] on 12/27/2024, which included representatives from:  Palliative Care Dietitian  Physical/Occupational Therapist Nurse Navigator Genetics Social work Survivorship RN Financial Navigator Research RN   [name] currently presents with history of breast cancer  We reviewed previous medical and familial history, history of present illness, and recent lab results along with all available histopathologic and imaging studies. The MOC considered available treatment options and made the following recommendations/referrals:  None  The MOC is a meeting of clinicians from various specialty areas who evaluate and discuss patients for whom a multidisciplinary approach is being considered. Final determinations in the plan of care are those of the provider(s).   Today's extended care, comprehensive team conference, [name] was not present for the discussion and was not examined.

## 2024-12-27 NOTE — Therapy (Signed)
 " OUTPATIENT OCCUPATIONAL THERAPY BREAST CANCER BASELINE EVALUATION   Patient Name: Amanda [name] MRN: [id number] DOB:Apr 21, 1951, 74 y.o., female Today's Date: 12/27/2024  END OF SESSION:  OT End of Session - 12/27/24 1702     Visit Number 1    Number of Visits 3    Date for Recertification  03/21/25    OT Start Time 1332    OT Stop Time 1400    OT Time Calculation (min) 28 min    Activity Tolerance Patient tolerated treatment well    Behavior During Therapy [facility] for tasks assessed/performed          Past Medical History:  Diagnosis Date   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 sting    Aortic stenosis    a. 2013: nl LV sys fxn, mild MR, no evidence of pulm htn; b. TTE 8/17: EF 60-65%, no RWMA, nl LV dia fxn,  mild AS, mod AI   CAD (coronary artery disease), native coronary artery 11/26/2020   Controlled type 2 diabetes mellitus with diabetic autonomic neuropathy, without long-term current use of insulin  (HCC) 08/10/2007   COPD (chronic obstructive pulmonary disease) (HCC) 10/30/2016   Depression    GAD (generalized anxiety disorder)    GERD (gastroesophageal reflux disease)    Hiatal hernia with gastroesophageal reflux 1997   Hyperlipidemia    Hypertension    Osteoporosis    S/P TAVR (transcatheter aortic valve replacement) 12/21/2023   s/p TAVR with a 34 mm Evolut FX via the TF approach by Dr. Wendel & Dr. Maryjane   Tobacco abuse    Past Surgical History:  Procedure Laterality Date   ABDOMINAL HYSTERECTOMY  1993   ANTERIOR CERVICAL DECOMP/DISCECTOMY FUSION N/A 10/26/2018   Procedure: ACDF C4-C5 C5-C6 C6-C7;  Surgeon: Onetha Kuba, MD;  Location: [facility] OR;  Service: Neurosurgery;  Laterality: N/A;   APPENDECTOMY     BACK SURGERY     BREAST BIOPSY Right 2007   benign   BREAST BIOPSY Left 12/06/2024   US  LT BREAST BX W LOC DEV 1ST LESION IMG BX SPEC US  GUIDE 12/06/2024 [facility]   BREAST BIOPSY Left 12/13/2024   MM LT BREAST BX W LOC DEV 1ST LESION IMAGE BX SPEC STEREO GUIDE 12/13/2024  [facility]   CHOLECYSTECTOMY     DILATION AND CURETTAGE OF UTERUS     ESOPHAGEAL DILATION     x 3   ESOPHAGOGASTRODUODENOSCOPY (EGD) WITH PROPOFOL  N/A 02/06/2022   Procedure: ESOPHAGOGASTRODUODENOSCOPY (EGD) WITH PROPOFOL ;  Surgeon: Therisa Bi, MD;  Location: [facility] ENDOSCOPY;  Service: Gastroenterology;  Laterality: N/A;   ESOPHAGOGASTRODUODENOSCOPY (EGD) WITH PROPOFOL  N/A 02/26/2022   Procedure: ESOPHAGOGASTRODUODENOSCOPY (EGD) WITH PROPOFOL ;  Surgeon: Therisa Bi, MD;  Location: [facility]OSCOPY;  Service: Gastroenterology;  Laterality: N/A;  EGD+dilation per Dr. Therisa   INCISION AND DRAINAGE Right 12/02/2021   Procedure: INCISION AND DRAINAGE;  Surgeon: Kathlynn Sharper, MD;  Location: [facility] ORS;  Service: Orthopedics;  Laterality: Right;   INTRAOPERATIVE TRANSTHORACIC ECHOCARDIOGRAM N/A 12/21/2023   Procedure: INTRAOPERATIVE TRANSTHORACIC ECHOCARDIOGRAM;  Surgeon: Wendel Lurena POUR, MD;  [facility] LAB;  Service: Cardiovascular;  Laterality: N/A;   LEFT HEART CATH AND CORONARY ANGIOGRAPHY N/A 11/11/2020   Procedure: LEFT HEART CATH AND CORONARY ANGIOGRAPHY;  Surgeon: Darron Deatrice LABOR, MD;  Location: [facility] INVASIVE CV LAB;  Service: Cardiovascular;  Laterality: N/A;   OOPHORECTOMY     OVARIAN CYST REMOVAL  1972   RIGHT/LEFT HEART CATH AND CORONARY ANGIOGRAPHY Bilateral 10/04/2023   Procedure: RIGHT/LEFT HEART CATH AND CORONARY ANGIOGRAPHY;  Surgeon:  Darron Deatrice LABOR, MD;  Location: [facility] INVASIVE CV LAB;  Service: Cardiovascular;  Laterality: Bilateral;   SALIVARY GLAND SURGERY     TONSILLECTOMY     4 yoa   TOTAL KNEE ARTHROPLASTY Left 12/24/2015   Procedure: LEFT TOTAL KNEE ARTHROPLASTY;  Surgeon: Glendia Cordella Hutchinson, MD;  Location[facility] OR;  Service: Orthopedics;  Laterality: Left;   TOTAL KNEE ARTHROPLASTY Right 12/30/2017   Procedure: RIGHT TOTAL KNEE ARTHROPLASTY;  Surgeon: Hutchinson Cordella Glendia, MD;  Location[facility] OR;  Service: Orthopedics;  Laterality: Right;   WRIST  FRACTURE SURGERY  11/2008   Patient Active Problem List   Diagnosis Date Noted   Malignant neoplasm of upper-inner quadrant of left breast in female, estrogen receptor positive (HCC) 12/23/2024   GAD (generalized anxiety disorder)    RBBB 12/21/2023   S/P TAVR (transcatheter aortic valve replacement) 12/21/2023   CAD (coronary artery disease), native coronary artery 11/26/2020   Aortic valve stenosis    COPD with chronic bronchitis (HCC) 10/30/2016   Gastroesophageal reflux disease 09/17/2016   Smoker 05/10/2015   Solitary pulmonary nodule 03/28/2013   Essential hypertension    Vitamin D  deficiency 05/29/2009   Pure hypercholesterolemia 12/07/2007   Major depressive disorder, recurrent episode, in partial remission 12/07/2007   Controlled type 2 diabetes mellitus with diabetic autonomic neuropathy, without long-term current use of insulin  (HCC) 08/10/2007    PCP: Dr Watt  REFERRING PROVIDER: Dr Melanee MART DIAG: L breast cancer   THERAPY DIAG:  Abnormal posture  Rationale for Evaluation and Treatment: Rehabilitation  ONSET DATE: 12/13/24  SUBJECTIVE:                                                                                                                                                                                           SUBJECTIVE STATEMENT: Patient reports she is here today after being refer by one of her medical team for her newly diagnosed left breast cancer.   PERTINENT HISTORY:  Patient was diagnosed with left  breast cancer - plan to meet with surgeon 01/02/25   PATIENT GOALS:   reduce lymphedema risk and learn post op HEP.   PAIN:  Are you having pain? Some soreness at times in R upper arm  PRECAUTIONS: Active CA , lymphedema risk L post surgery      HAND DOMINANCE: right  WEIGHT BEARING RESTRICTIONS: No  FALLS:  Has patient fallen in last 6 months? Yes- in PT for L hip pain   LIVING ENVIRONMENT: Patient lives with: Daughter moved in  with her to help   OCCUPATION and LEISURE: Retired - do things around the house - has  ramp to get into the house and tub/shower with seat and railing , go 2-3 x wk to exercise class at [facility]    OBJECTIVE:  COGNITION: Overall cognitive status: Within functional limits for tasks assessed    POSTURE:  Forward head and rounded shoulders posture  UPPER EXTREMITY AROM/PROM:  Active range of motion for bilateral shoulders within functional limits.  Limited in endrange shoulder flexion abduction. External and internal rotation within functional limits     CERVICAL AROM: All within normal limits:     UPPER EXTREMITY STRENGTH: 4+/5 for shoulder flexion abduction, internal/external rotation 4/5  LYMPHEDEMA ASSESSMENTS:   L-DEX LYMPHEDEMA SCREENING:  The patient was assessed using the L-Dex machine today to produce a lymphedema index baseline score. The patient will be reassessed on a regular basis (typically every 3 months) to obtain new L-Dex scores. If the score is > 6.5 points away from his/her baseline score indicating onset of subclinical lymphedema, it will be recommended to wear a compression garment for 4 weeks, 12 hours per day and then be reassessed. If the score continues to be > 6.5 points from baseline at reassessment, we will initiate lymphedema treatment. Assessing in this manner has a 95% rate of preventing clinically significant lymphedema.   L-DEX FLOWSHEETS - 12/27/24 1700       L-DEX LYMPHEDEMA SCREENING   Measurement Type Unilateral    L-DEX MEASUREMENT EXTREMITY Upper Extremity    POSITION  Standing    DOMINANT SIDE Right    At Risk Side Left    BASELINE SCORE (UNILATERAL) 4.1          L-Dex score was done and baseline set today. Patient and daughter was educated in home program for patient to initiate and perform in supine active assisted range of motion using wand for shoulder flexion and abduction to 90 degrees - 3 times a day 10 reps  pain-free In supine external rotation with gravity 10 reps 3 times a day Can increase after 7 to 10 days past 90 degrees kidney and slight pull less than a 2/10. Handout was provided and reviewed with patient and daughter about signs and symptoms as well as precautions and prevention of lymphedema.  Will be further reviewed after surgery  PATIENT EDUCATION:  Education details: Lymphedema risk reduction and post op shoulder/posture HEP Person educated: Patient Education method: Explanation, Demonstration, Handout Education comprehension: Patient verbalized understanding and returned demonstration    ASSESSMENT:  CLINICAL IMPRESSION: Her multidisciplinary medical team has met to assess and determine a recommended treatment plan. She is planning to meet with the surgeon on 01/03/2024.  Patient daughter accompanying patient at evaluation.  Was educated on home program to be performed after surgery.  As well as handout provided and reviewed with patient and daughter on lymphedema signs and symptoms as well as prevention precautions.  She will benefit from a post op OT reassessment to determine needs and from L-Dex screens every 3 months for 2 years to detect subclinical lymphedema.  Pt will benefit from skilled therapeutic intervention to improve on the following deficits: Decreased knowledge of precautions and lymphedema education, impaired UE functional use, pain, decreased ROM, postural dysfunction.   OT treatment/interventions: ADL/self-care home management, pt/family education, therapeutic exercise,manual therapy  REHAB POTENTIAL: Good  CLINICAL DECISION MAKING: Stable/uncomplicated  EVALUATION COMPLEXITY: Low   GOALS: Goals reviewed with patient? YES  LONG TERM GOALS: (STG=LTG)    Name Target Date Goal status  1 Pt will be able to verbalize understanding of pertinent  lymphedema risk reduction practices relevant to her dx specifically related to skin care.  Baseline:  No knowledge  12 weeks Initial  2 Pt will be able to return demo and/or verbalize understanding of the post op HEP related to regaining shoulder ROM. Baseline:  No knowledge Today Achieved at eval         4 Pt will demo she has regained full shoulder ROM and function post operatively compared to baselines.  Baseline: See objective measurements taken today. 12 weeks Initial    PLAN:  OT FREQUENCY/DURATION: EVAL and 2 follow up appointment.   PLAN FOR NEXT SESSION: will reassess 2-3 weeks post op to determine needs.    Occupational Therapy Information for After Breast Cancer Surgery/Treatment:  Lymphedema is a swelling condition that you may be at risk for in your arm if you have lymph nodes removed from the armpit area.  After a sentinel node biopsy, the risk is approximately 5-9% and is higher after an axillary node dissection.  There is treatment available for this condition and it is not life-threatening.  Contact your physician or occupational therapist with concerns. You may begin the 4 shoulder/posture exercises (see additional sheet) when permitted by your physician (typically a week after surgery).  If you have drains, you may need to wait until those are removed before beginning range of motion exercises.  A general recommendation is to not lift your arms above shoulder height until drains are removed.  These exercises should be done to your tolerance and gently.  This is not a no pain/no gain type of recovery so listen to your body and stretch into the range of motion that you can tolerate, stopping if you have pain.  If you are having immediate reconstruction, ask your plastic surgeon about doing exercises as he or she may want you to wait. .  While undergoing any medical procedure or treatment, try to avoid blood pressure being taken or needle sticks from occurring on the arm on the side of cancer.   This recommendation begins after surgery and continues for the rest of your life.  This may help  reduce your risk of getting lymphedema (swelling in your arm). An excellent resource for those seeking information on lymphedema is the National Lymphedema Network's web site. It can be accessed at www.lymphnet.org If you notice swelling in your hand, arm or breast at any time following surgery (even if it is many years from now), please contact your doctor or occupational therapist to discuss this.  Lymphedema can be treated at any time but it is easier for you if it is treated early on.  If you feel like your shoulder motion is not returning to normal in a reasonable amount of time, please contact your surgeon or occupational therapist.  [facility] [phone]. [address] [place], [place] [address]      Ancel Peters, OTR/L,CLT 12/27/2024, 5:06 PM   "

## 2025-01-02 ENCOUNTER — Ambulatory Visit: Admitting: General Surgery

## 2025-01-02 ENCOUNTER — Inpatient Hospital Stay: Admitting: Licensed Clinical Social Worker

## 2025-01-02 ENCOUNTER — Encounter: Payer: Self-pay | Admitting: General Surgery

## 2025-01-02 ENCOUNTER — Telehealth: Payer: Self-pay

## 2025-01-02 ENCOUNTER — Telehealth: Payer: Self-pay | Admitting: General Surgery

## 2025-01-02 VITALS — BP 156/84 | HR 82 | Temp 98.0°F | Ht 68.0 in | Wt 225.0 lb

## 2025-01-02 DIAGNOSIS — Z803 Family history of malignant neoplasm of breast: Secondary | ICD-10-CM

## 2025-01-02 DIAGNOSIS — C50212 Malignant neoplasm of upper-inner quadrant of left female breast: Secondary | ICD-10-CM | POA: Diagnosis not present

## 2025-01-02 DIAGNOSIS — Z800 Family history of malignant neoplasm of digestive organs: Secondary | ICD-10-CM

## 2025-01-02 DIAGNOSIS — Z170 Estrogen receptor positive status [ER+]: Secondary | ICD-10-CM | POA: Diagnosis not present

## 2025-01-02 NOTE — Telephone Encounter (Signed)
Faxed pulmonary clearance to Dr. Flora Lipps at [phone].

## 2025-01-02 NOTE — Progress Notes (Unsigned)
 REFERRING PROVIDER: Melanee Annah BROCKS, MD [address] [place],  [place] [address]  PRIMARY PROVIDER:  Watt Mirza, MD  PRIMARY REASON FOR VISIT:  1. Malignant neoplasm of upper-inner quadrant of left breast in female, estrogen receptor positive (HCC)   2. Family history of breast cancer   3. Family history of pancreatic cancer      HISTORY OF PRESENT ILLNESS:   Ms. Aguilera, a 74 y.o. female, was seen for a [facility] cancer genetics consultation at the request of Dr. Melanee due to a personal and family history of cancer.  Ms. Harwick presents to clinic today to discuss the possibility of a hereditary predisposition to cancer, genetic testing, and to further clarify her future cancer risks, as well as potential cancer risks for family members.   CANCER HISTORY:  In 2026, at the age of 51, Ms. Perrone was diagnosed with invasive ductal carcinoma of the left breast, ER/PR+, HER2-. Treatment plan currently includes lumpectomy scheduled for 01/15/2025.  Oncology History  Malignant neoplasm of upper-inner quadrant of left breast in female, estrogen receptor positive (HCC)  12/23/2024 Initial Diagnosis   Malignant neoplasm of upper-inner quadrant of left breast in female, estrogen receptor positive (HCC)   12/23/2024 Cancer Staging   Staging form: Breast, AJCC 8th Edition - Clinical stage from 12/23/2024: Stage IA (cT1b, cN0(f), cM0, G1, ER+, PR+, HER2-) - Signed by Melanee Annah BROCKS, MD on 12/23/2024 Stage prefix: Initial diagnosis Method of lymph node assessment: Core biopsy Histologic grading system: 3 grade system     RELEVANT MEDICAL HISTORY:  Menarche was at age ***.  First live birth at age ***.  Ovaries intact: {Yes/No-Ex:120004}.  Hysterectomy: yes.  Menopausal status: postmenopausal.  HRT use: {Numbers 1-12 multi-select:20307} years. Colonoscopy: {Yes/No-Ex:120004}; {normal/abnormal/not examined:14677}.  Past Medical History:  Diagnosis Date   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 sting    Aortic  stenosis    a. 2013: nl LV sys fxn, mild MR, no evidence of pulm htn; b. TTE 8/17: EF 60-65%, no RWMA, nl LV dia fxn,  mild AS, mod AI   CAD (coronary artery disease), native coronary artery 11/26/2020   Controlled type 2 diabetes mellitus with diabetic autonomic neuropathy, without long-term current use of insulin  (HCC) 08/10/2007   COPD (chronic obstructive pulmonary disease) (HCC) 10/30/2016   Depression    GAD (generalized anxiety disorder)    GERD (gastroesophageal reflux disease)    Hiatal hernia with gastroesophageal reflux 1997   Hyperlipidemia    Hypertension    Osteoporosis    S/P TAVR (transcatheter aortic valve replacement) 12/21/2023   s/p TAVR with a 34 mm Evolut FX via the TF approach by Dr. Wendel & Dr. Maryjane   Tobacco abuse     Past Surgical History:  Procedure Laterality Date   ABDOMINAL HYSTERECTOMY  1993   ANTERIOR CERVICAL DECOMP/DISCECTOMY FUSION N/A 10/26/2018   Procedure: ACDF C4-C5 C5-C6 C6-C7;  Surgeon: Onetha Kuba, MD;  Location: [facility] OR;  Service: Neurosurgery;  Laterality: N/A;   APPENDECTOMY     BACK SURGERY     BREAST BIOPSY Right 2007   benign   BREAST BIOPSY Left 12/06/2024   US  LT BREAST BX W LOC DEV 1ST LESION IMG BX SPEC US  GUIDE 12/06/2024 [facility]   BREAST BIOPSY Left 12/13/2024   MM LT BREAST BX W LOC DEV 1ST LESION IMAGE BX SPEC STEREO GUIDE 12/13/2024 [facility]   CHOLECYSTECTOMY     DILATION AND CURETTAGE OF UTERUS     ESOPHAGEAL DILATION     x  3   ESOPHAGOGASTRODUODENOSCOPY (EGD) WITH PROPOFOL  N/A 02/06/2022   Procedure: ESOPHAGOGASTRODUODENOSCOPY (EGD) WITH PROPOFOL ;  Surgeon: Therisa Bi, MD;  Location: [facility] ENDOSCOPY;  Service: Gastroenterology;  Laterality: N/A;   ESOPHAGOGASTRODUODENOSCOPY (EGD) WITH PROPOFOL  N/A 02/26/2022   Procedure: ESOPHAGOGASTRODUODENOSCOPY (EGD) WITH PROPOFOL ;  Surgeon: Therisa Bi, MD;  Location: [facility] ENDOSCOPY;  Service: Gastroenterology;  Laterality: N/A;  EGD+dilation per Dr. Therisa   INCISION  AND DRAINAGE Right 12/02/2021   Procedure: INCISION AND DRAINAGE;  Surgeon: Kathlynn Sharper, MD;  Location: [facility] ORS;  Service: Orthopedics;  Laterality: Right;   INTRAOPERATIVE TRANSTHORACIC ECHOCARDIOGRAM N/A 12/21/2023   Procedure: INTRAOPERATIVE TRANSTHORACIC ECHOCARDIOGRAM;  Surgeon: Wendel Lurena POUR, MD;  [facility]: [facility];  Service: Cardiovascular;  Laterality: N/A;   LEFT HEART CATH AND CORONARY ANGIOGRAPHY N/A 11/11/2020   Procedure: LEFT HEART CATH AND CORONARY ANGIOGRAPHY;  Surgeon: Darron Deatrice LABOR, MD;  Location: [facility] LAB;  Service: Cardiovascular;  Laterality: N/A;   OOPHORECTOMY     OVARIAN CYST REMOVAL  1972   RIGHT/LEFT HEART CATH AND CORONARY ANGIOGRAPHY Bilateral 10/04/2023   Procedure: RIGHT/LEFT HEART CATH AND CORONARY ANGIOGRAPHY;  Surgeon: Darron Deatrice LABOR, MD;  Location: [facility] CV LAB;  Service: Cardiovascular;  Laterality: Bilateral;   SALIVARY GLAND SURGERY     TONSILLECTOMY     4 yoa   TOTAL KNEE ARTHROPLASTY Left 12/24/2015   Procedure: LEFT TOTAL KNEE ARTHROPLASTY;  Surgeon: Glendia Cordella Hutchinson, MD;  Location: [facility] OR;  Service: Orthopedics;  Laterality: Left;   TOTAL KNEE ARTHROPLASTY Right 12/30/2017   Procedure: RIGHT TOTAL KNEE ARTHROPLASTY;  Surgeon: Hutchinson Cordella Glendia, MD;  Location: [facility] OR;  Service: Orthopedics;  Laterality: Right;   WRIST FRACTURE SURGERY  11/2008    FAMILY HISTORY:  We obtained a detailed, 4-generation family history.  Significant diagnoses are listed below: Family History  Problem Relation Age of Onset   Hyperlipidemia Mother    Arthritis Mother    Diabetes Mother    Heart disease Mother    Colon polyps Mother    Pancreatic cancer Mother    Hypercholesterolemia Sister    Colon polyps Sister    Breast cancer Sister    Lung cancer Maternal Aunt    Skin cancer Maternal Uncle    Cancer Paternal Grandmother        breast   Colon cancer Neg Hx    Esophageal cancer Neg Hx    Rectal cancer Neg Hx     Stomach cancer Neg Hx    Sister- breast cancer Mother - pancreatic cancer   Ms. Mindel is unaware of previous family history of genetic testing for hereditary cancer risks. There is no reported Ashkenazi Jewish ancestry. There is no known consanguinity.  GENETIC COUNSELING ASSESSMENT: Ms. Gault is a 74 y.o. female with a personal and family history of breast cancer and family history of pancreatic cancer which is somewhat suggestive of a hereditary cancer syndrome and predisposition to cancer. We, therefore, discussed and recommended the following at today's visit.   DISCUSSION: We discussed that, in general, most cancer is not inherited in families, but instead is sporadic or familial.We discussed that approximately 10% of breast cancer is hereditary. Most cases of hereditary breast cancer are associated with BRCA1/BRCA2 genes, although there are other genes associated with hereditary cancer as well. Cancers and risks are gene specific. We discussed that testing is beneficial for several reasons including knowing about cancer risks, identifying potential screening and risk-reduction options that may be appropriate, and to understand  if other family members could be at risk for cancer and allow them to undergo genetic testing.   We reviewed the characteristics, features and inheritance patterns of hereditary cancer syndromes. We also discussed genetic testing, including the appropriate family members to test, the process of testing, insurance coverage and turn-around-time for results. We discussed the implications of a negative, positive and/or variant of uncertain significant result. We recommended Ms. Troublefield pursue genetic testing for the [facility] CancerNext+RNA gene panel. She had her blood drawn for this on 12/22/2024.  The [facility] CancerNext+RNAinsight Panel includes sequencing, rearrangement analysis, and RNA analysis for the following 40 genes: APC, ATM, BAP1, BARD1, BMPR1A, BRCA1, BRCA2, BRIP1,  CDH1, CDKN2A, CHEK2, FH, FLCN, MET, MLH1, MSH2, MSH6, MUTYH, NF1, NTHL1, PALB2, PMS2, PTEN, RAD51C, RAD51D, RPS20, SMAD4, STK11, TP53, TSC1, TSC2, and VHL (sequencing and deletion/duplication); AXIN2, HOXB13, MBD4, MSH3, POLD1 and POLE (sequencing only); EPCAM and GREM1 (deletion/duplication only).  Based on Ms. Jennings's personal and family history of cancer, she meets medical criteria for genetic testing. Despite that she meets criteria, she may still have an out of pocket cost.   PLAN: After considering the risks, benefits, and limitations, Ms. Delone provided informed consent to pursue genetic testing, her blood sample was already drawn  1/16 and sent to [facility] for analysis of the CancerNext+RNA Panel. Results should be available within approximately 1 weeks' time, at which point they will be disclosed by telephone to Ms. Frein, as will any additional recommendations warranted by these results. Ms. Kook will receive a summary of her genetic counseling visit and a copy of her results once available. This information will also be available in Epic.   Ms. Edinger questions were answered to her satisfaction today. Our contact information was provided should additional questions or concerns arise. Thank you for the referral and allowing us  to share in the care of your patient.   Dena Cary, MS, [facility] Genetic Counselor [email or web address] Phone: [phone]  I personally spent a total of *** minutes in the care of the patient today including getting/reviewing separately obtained history, counseling and educating, and documenting clinical information in the EHR.  Dr. Delinda was available for discussion regarding this case.   _______________________________________________________________________ For Office Staff:  Number of people involved in session: 1 Was an Intern/ student involved with case: no

## 2025-01-02 NOTE — Telephone Encounter (Signed)
 Patient has been advised of Pre-Admission date/time, and Surgery date at [facility].  Surgery Date: 01/15/25  Preadmission Testing Date: Preadmissions to call patient.    Patient informed of the scheduling process and surgery information given at time of office visit.   Patient has been made aware to call [phone], between 1-3:00pm the day before surgery, to find out what time to arrive for surgery.

## 2025-01-02 NOTE — Patient Instructions (Signed)
 We have spoken today about removing a lump in your breast. This will be done by Dr. Marinda at [facility].  If you are on any injectable weight loss medication, you will need to stop taking your GLP-1 injectable (weight loss) medications 8 days before your surgery to avoid any complications with anesthesia.   You will most likely be able to leave the hospital several hours after your surgery. Rarely, a patient needs to stay over night but this is a possibility.  Plan to tentatively be off work for 1-2 weeks following the surgery and may return with approximately 2 more weeks of a lifting restriction, no greater than 15 lbs.  Please see your Blue surgery sheet for more information. Our surgery scheduler will call you to look at surgery dates and to go over information.   If you have FMLA or Disability paperwork that needs to be filled out, please have your company fax your paperwork to [phone] or you may drop this by either office. This paperwork will be filled out within 3 days after your surgery has been completed.    Lumpectomy A lumpectomy is a form of breast conserving or breast preservation surgery. It may also be referred to as a partial mastectomy. During a lumpectomy, the portion of the breast that contains the cancerous tumor or breast mass (the lump) is removed. Some normal tissue around the lump may also be removed to make sure all of the tumor has been removed.  LET [facility] CARE PROVIDER KNOW ABOUT: Any allergies you have. All medicines you are taking, including vitamins, herbs, eye drops, creams, and over-the-counter medicines. Previous problems you or members of your family have had with the use of anesthetics. Any blood disorders you have. Previous surgeries you have had. Medical conditions you have. RISKS AND COMPLICATIONS Generally, this is a safe procedure. However, problems can occur and include: Bleeding. Infection. Pain. Temporary swelling. Change in the  shape of the breast, particularly if a large portion is removed. BEFORE THE PROCEDURE Ask your health care provider about changing or stopping your regular medicines. This is especially important if you are taking diabetes medicines or blood thinners. Do not eat or drink anything after midnight on the night before the procedure or as directed by your health care provider. Ask your health care provider if you can take a sip of water with any approved medicines. On the day of surgery, your health care provider will use a mammogram or ultrasound to locate and mark the tumor in your breast. These markings on your breast will show where the cut (incision) will be made.   PROCEDURE  An IV tube will be put into one of your veins. You may be given medicine to help you relax before the surgery (sedative). You will be given one of the following: A medicine that numbs the area (local anesthetic). A medicine that makes you fall asleep (general anesthetic). Your health care provider will use a kind of electric scalpel that uses heat to minimize bleeding (electrocautery knife). A curved incision (like a smile or frown) that follows the natural curve of your breast is made, to allow for minimal scarring and better healing. The tumor will be removed with some of the surrounding tissue. This will be sent to the lab for analysis. Your health care provider may also remove your lymph nodes at this time if needed. Sometimes, but not always, a rubber tube called a drain will be surgically inserted into your breast area or  armpit to collect excess fluid that may accumulate in the space where the tumor was. This drain is connected to a plastic bulb on the outside of your body. This drain creates suction to help remove the fluid. The incisions will be closed with stitches (sutures). A bandage may be placed over the incisions. AFTER THE PROCEDURE You will be taken to the recovery area. You will be given medicine for  pain. A small rubber drain may be placed in the breast for 2-3 days to prevent a collection of blood (hematoma) from developing in the breast. You will be given instructions on caring for the drain before you go home. A pressure bandage (dressing) will be applied for 1-2 days to prevent bleeding. Ask your health care provider how to care for your bandage at home.   This information is not intended to replace advice given to you by your health care provider. Make sure you discuss any questions you have with your health care provider.   Document Released: 01/04/2007 Document Revised: 12/14/2014 Document Reviewed: 04/28/2013 Elsevier Interactive Patient Education [employer]

## 2025-01-02 NOTE — Progress Notes (Signed)
 Patient ID: Amanda [name], female   DOB: [birth year], 74 y.o.   MRN: [id number] CC: Left Breast Cancer History of Present Illness Amanda [name] is a 74 y.o. female with past medical history significant for COPD and coronary artery disease status post TAVR who presents in consultation for left breast cancer.  The patient reports that she was having pain in her left breast.  She denies feeling any masses.  She denies any overlying skin changes or nipple discharge.  She went to get a screening mammogram and had an area of concern.  She then had a diagnostic mammogram and a biopsy.  The biopsy returned as invasive ductal carcinoma.  It is ER/PR positive.  She reports a history of breast cancer in her sister.  She does report that she had a breast biopsy and the right breast that was benign back in the 70s.  She has G2, P2.  She had menses at age 55 and then had a hysterectomy for precancerous cells in the 68s.  Past Medical History Past Medical History:  Diagnosis Date   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 sting    Aortic stenosis    a. 2013: nl LV sys fxn, mild MR, no evidence of pulm htn; b. TTE 8/17: EF 60-65%, no RWMA, nl LV dia fxn,  mild AS, mod AI   CAD (coronary artery disease), native coronary artery 11/26/2020   Controlled type 2 diabetes mellitus with diabetic autonomic neuropathy, without long-term current use of insulin  (HCC) 08/10/2007   COPD (chronic obstructive pulmonary disease) (HCC) 10/30/2016   Depression    GAD (generalized anxiety disorder)    GERD (gastroesophageal reflux disease)    Hiatal hernia with gastroesophageal reflux 1997   Hyperlipidemia    Hypertension    Osteoporosis    S/P TAVR (transcatheter aortic valve replacement) 12/21/2023   s/p TAVR with a 34 mm Evolut FX via the TF approach by Dr. Wendel & Dr. Maryjane   Tobacco abuse        Past Surgical History:  Procedure Laterality Date   ABDOMINAL HYSTERECTOMY  1993   ANTERIOR CERVICAL DECOMP/DISCECTOMY FUSION N/A  10/26/2018   Procedure: ACDF C4-C5 C5-C6 C6-C7;  Surgeon: Onetha Kuba, MD;  Location: [facility] OR;  Service: Neurosurgery;  Laterality: N/A;   APPENDECTOMY     BACK SURGERY     BREAST BIOPSY Right 2007   benign   BREAST BIOPSY Left 12/06/2024   US  LT BREAST BX W LOC DEV 1ST LESION IMG BX SPEC US  GUIDE 12/06/2024 [facility]   BREAST BIOPSY Left 12/13/2024   MM LT BREAST BX W LOC DEV 1ST LESION IMAGE BX SPEC STEREO GUIDE 12/13/2024 [facility]   CHOLECYSTECTOMY     DILATION AND CURETTAGE OF UTERUS     ESOPHAGEAL DILATION     x 3   ESOPHAGOGASTRODUODENOSCOPY (EGD) WITH PROPOFOL  N/A 02/06/2022   Procedure: ESOPHAGOGASTRODUODENOSCOPY (EGD) WITH PROPOFOL ;  Surgeon: Therisa Bi, MD;  Location: [facility] ENDOSCOPY;  Service: Gastroenterology;  Laterality: N/A;   ESOPHAGOGASTRODUODENOSCOPY (EGD) WITH PROPOFOL  N/A 02/26/2022   Procedure: ESOPHAGOGASTRODUODENOSCOPY (EGD) WITH PROPOFOL ;  Surgeon: Therisa Bi, MD;  Location: [facility]OSCOPY;  Service: Gastroenterology;  Laterality: N/A;  EGD+dilation per Dr. Therisa   INCISION AND DRAINAGE Right 12/02/2021   Procedure: INCISION AND DRAINAGE;  Surgeon: Kathlynn Sharper, MD;  Location: [facility] ORS;  Service: Orthopedics;  Laterality: Right;   INTRAOPERATIVE TRANSTHORACIC ECHOCARDIOGRAM N/A 12/21/2023   Procedure: INTRAOPERATIVE TRANSTHORACIC ECHOCARDIOGRAM;  Surgeon: Wendel Lurena POUR, MD;  Location: [facility] INVASIVE CV LAB;  Service:  Cardiovascular;  Laterality: N/A;   LEFT HEART CATH AND CORONARY ANGIOGRAPHY N/A 11/11/2020   Procedure: LEFT HEART CATH AND CORONARY ANGIOGRAPHY;  Surgeon: Darron Deatrice LABOR, MD;  Location: [facility] INVASIVE CV LAB;  Service: Cardiovascular;  Laterality: N/A;   OOPHORECTOMY     OVARIAN CYST REMOVAL  1972   RIGHT/LEFT HEART CATH AND CORONARY ANGIOGRAPHY Bilateral 10/04/2023   Procedure: RIGHT/LEFT HEART CATH AND CORONARY ANGIOGRAPHY;  Surgeon: Darron Deatrice LABOR, MD;  Location: [facility] INVASIVE CV LAB;  Service: Cardiovascular;  Laterality: Bilateral;    SALIVARY GLAND SURGERY     TONSILLECTOMY     4 yoa   TOTAL KNEE ARTHROPLASTY Left 12/24/2015   Procedure: LEFT TOTAL KNEE ARTHROPLASTY;  Surgeon: Glendia Cordella Hutchinson, MD;  Location: [facility] OR;  Service: Orthopedics;  Laterality: Left;   TOTAL KNEE ARTHROPLASTY Right 12/30/2017   Procedure: RIGHT TOTAL KNEE ARTHROPLASTY;  Surgeon: Hutchinson Cordella Glendia, MD;  Location: [facility] OR;  Service: Orthopedics;  Laterality: Right;   WRIST FRACTURE SURGERY  11/2008    Allergies[1]  Current Outpatient Medications  Medication Sig Dispense Refill   acetaminophen  (TYLENOL ) 500 MG tablet Take 1,000 mg by mouth every 8 (eight) hours as needed for mild pain (pain score 1-3) or moderate pain (pain score 4-6).     albuterol  (VENTOLIN  HFA) 108 (90 Base) MCG/ACT inhaler Inhale 2 puffs into the lungs every 4 (four) hours as needed for wheezing or shortness of breath. 3 each 2   amitriptyline  (ELAVIL ) 50 MG tablet Take 1 tablet (50 mg total) by mouth at bedtime. 90 tablet 1   Ascorbic Acid  (VITAMIN C ) 1000 MG tablet Take 1,000 mg by mouth daily.     aspirin  EC 81 MG tablet Take 81 mg by mouth daily.     budesonide  (PULMICORT ) 0.5 MG/2ML nebulizer solution Take 2 mLs (0.5 mg total) by nebulization 2 (two) times daily. 360 mL 3   buPROPion  (WELLBUTRIN  XL) 150 MG 24 hr tablet Take 1 tablet by mouth once daily 90 tablet 0   calcium  carbonate (OS-CAL - DOSED IN MG OF ELEMENTAL CALCIUM ) 1250 (500 Ca) MG tablet Take 1 tablet by mouth daily at 6 (six) AM.     calcium  carbonate (TUMS - DOSED IN MG ELEMENTAL CALCIUM ) 500 MG chewable tablet Chew 2 tablets by mouth daily as needed for indigestion or heartburn.     Cholecalciferol  (VITAMIN D ) 1000 UNITS capsule Take 2,000 Units by mouth daily.     clonazePAM  (KLONOPIN ) 0.5 MG tablet Take 1 tablet by mouth twice daily as needed 60 tablet 5   cyanocobalamin  (VITAMIN B12) 1000 MCG/ML injection Inject into the muscle.     cyclobenzaprine  (FLEXERIL ) 10 MG tablet Take 1 tablet by mouth three  times daily as needed for muscle spasm 60 tablet 3   dimenhyDRINATE (DRAMAMINE) 50 MG tablet Take 50 mg by mouth every 8 (eight) hours as needed for nausea or dizziness.     donepezil  (ARICEPT ) 5 MG tablet TAKE 1 TABLET BY MOUTH AT BEDTIME 90 tablet 3   ezetimibe  (ZETIA ) 10 MG tablet Take 1 tablet by mouth once daily 90 tablet 3   fluticasone  (FLONASE) 50 MCG/ACT nasal spray Place 2 sprays into both nostrils daily as needed for rhinitis.     furosemide  (LASIX ) 20 MG tablet Take 1 tablet (20 mg total) by mouth daily as needed (for increased shortness of breath or weight gain greater than 3 pounds overnight.). 45 tablet 3   gabapentin  (NEURONTIN ) 100 MG capsule Take 100 mg by  mouth.     guaiFENesin  (MUCINEX ) 600 MG 12 hr tablet Take 600 mg by mouth 2 (two) times daily as needed for to loosen phlegm.     ipratropium-albuterol  (DUONEB) 0.5-2.5 (3) MG/3ML SOLN Take 3 mLs by nebulization every 6 (six) hours as needed. 360 mL 3   magnesium  oxide (MAG-OX) 400 (240 Mg) MG tablet Take 400 mg by mouth daily.     metoprolol  tartrate (LOPRESSOR ) 25 MG tablet Take 1/2 (one-half) tablet by mouth twice daily 90 tablet 3   Multiple Vitamin (MULTIVITAMIN) tablet Take 1 tablet by mouth daily.     naproxen  sodium (ALEVE ) 220 MG tablet Take 220 mg by mouth in the morning.     pantoprazole  (PROTONIX ) 40 MG tablet TAKE 1 TABLET BY MOUTH ONCE DAILY (TAKE  30  MINUTES  PRIOR  TO  BREAKFAST) 90 tablet 0   predniSONE  (DELTASONE ) 20 MG tablet 2 tabs po for 4 days, then 1 tab po for 4 days 12 tablet 0   rosuvastatin  (CRESTOR ) 40 MG tablet Take 1 tablet by mouth once daily 90 tablet 3   Simethicone  125 MG CAPS Take 125 mg by mouth daily as needed (gas).     spironolactone  (ALDACTONE ) 25 MG tablet Take 0.5 tablets (12.5 mg total) by mouth daily. 45 tablet 3   traZODone  (DESYREL ) 50 MG tablet Take 50 mg by mouth at bedtime as needed for sleep.     vitamin E 180 MG (400 UNITS) capsule Take 400 Units by mouth daily.     No  current facility-administered medications for this visit.    Family History Family History  Problem Relation Age of Onset   Hyperlipidemia Mother    Arthritis Mother    Diabetes Mother    Heart disease Mother    Colon polyps Mother    Pancreatic cancer Mother    Hypercholesterolemia Sister    Colon polyps Sister    Breast cancer Sister    Lung cancer Maternal Aunt    Skin cancer Maternal Uncle    Cancer Paternal Grandmother        breast   Colon cancer Neg Hx    Esophageal cancer Neg Hx    Rectal cancer Neg Hx    Stomach cancer Neg Hx        Social History Social History[2]   Former Smoker, rare alcohol use   ROS Full ROS of systems performed and is otherwise negative there than what is stated in the HPI  Physical Exam Blood pressure (!) 156/84, pulse 82, temperature 98 F (36.7 C), temperature source Oral, height 5' 8 (1.727 m), weight 225 lb (102.1 kg), SpO2 97%.  Alert and oriented x 3, normal work of breathing room air, regular rate and rhythm, breast exam performed in the presence of a chaperone.  Right breast without any axillary lymphadenopathy.  There is a well-healed right breast periareolar incision.  There is no dominant masses or lesions.  There is no nipple discharge.  On the left breast there is no axillary lymphadenopathy.  She does have some bruising over the lateral aspect of her left breast.  There is some soreness where the biopsy was done but no other overlying skin changes or dominant masses appreciated.  Data Reviewed Mammogram reviewed and this is significant for a small lesion in her left breast.  Pathology consistent with invasive ductal carcinoma.  I have personally reviewed the patient's imaging and medical records.    Assessment    Patient is a 74 year old  with biopsy-proven invasive ductal carcinoma that is ER/PR positive.  This is a small foci of cancer.  I discussed with her the treatment options for stage I cancer.  She can either  undergo a mastectomy or lumpectomy.  Given her age and the low risk profile of the tumor then it would be appropriate to forego any sentinel lymph node biopsy.  She is elected to undergo a lumpectomy.  I discussed the risk, benefits alternatives of this procedure including risk of infection, bleeding, positive margins and need for reexcision.  I also discussed with her that it would be recommended that she undergo radiation therapy and hormonal therapy for 5 years.  She understands these risk and wishes to proceed with surgery.  We will have her undergo pulmonology and cardiology risk stratification.  If possible would like to hold her aspirin  5 days prior to the procedure.  She will also have Savi scout placed.  A total of 62 minutes was spent reviewing the patient's chart, performing history and physical and discussing treatment options with the patient  Jayson MALVA Endow 01/02/2025, 1:40 PM     [1]  Allergies Allergen Reactions   Honey Bee Venom [Bee Venom] Anaphylaxis   Diazepam Other (See Comments)    REACTION: pt states she gets very angry and abusive verbally on this medication   [2]  Social History Tobacco Use   Smoking status: Former    Average packs/day: 0.8 packs/day for 45.0 years (33.8 ttl pk-yrs)    Types: Cigarettes    Start date: 01/2023   Smokeless tobacco: Never   Tobacco comments:    Does not on starting back smoking.  Vaping Use   Vaping status: Never Used  Substance Use Topics   Alcohol use: Yes    Alcohol/week: 0.0 standard drinks of alcohol    Comment: glass of wine twice a month    Drug use: No

## 2025-01-03 ENCOUNTER — Ambulatory Visit: Payer: Self-pay | Admitting: Licensed Clinical Social Worker

## 2025-01-03 ENCOUNTER — Telehealth: Payer: Self-pay | Admitting: Licensed Clinical Social Worker

## 2025-01-03 ENCOUNTER — Telehealth: Payer: Self-pay

## 2025-01-03 ENCOUNTER — Encounter: Payer: Self-pay | Admitting: *Deleted

## 2025-01-03 ENCOUNTER — Telehealth: Payer: Self-pay | Admitting: Cardiovascular Disease

## 2025-01-03 ENCOUNTER — Other Ambulatory Visit: Payer: Self-pay | Admitting: General Surgery

## 2025-01-03 ENCOUNTER — Encounter: Payer: Self-pay | Admitting: Licensed Clinical Social Worker

## 2025-01-03 DIAGNOSIS — Z1379 Encounter for other screening for genetic and chromosomal anomalies: Secondary | ICD-10-CM | POA: Insufficient documentation

## 2025-01-03 DIAGNOSIS — C50212 Malignant neoplasm of upper-inner quadrant of left female breast: Secondary | ICD-10-CM

## 2025-01-03 DIAGNOSIS — C50912 Malignant neoplasm of unspecified site of left female breast: Secondary | ICD-10-CM

## 2025-01-03 HISTORY — DX: Encounter for other screening for genetic and chromosomal anomalies: Z13.79

## 2025-01-03 NOTE — Progress Notes (Signed)
 HPI:   Ms. Bealer was previously seen in the [facility] due to a personal and family history of cancer and concerns regarding a hereditary predisposition to cancer. Please refer to our prior cancer genetics clinic note for more information regarding our discussion, assessment and recommendations, at the time. Ms. Herzig recent genetic test results were disclosed to her, as were recommendations warranted by these results. These results and recommendations are discussed in more detail below.  CANCER HISTORY:  Oncology History  Malignant neoplasm of upper-inner quadrant of left breast in female, estrogen receptor positive (HCC)  12/23/2024 Initial Diagnosis   Malignant neoplasm of upper-inner quadrant of left breast in female, estrogen receptor positive (HCC)   12/23/2024 Cancer Staging   Staging form: Breast, AJCC 8th Edition - Clinical stage from 12/23/2024: Stage IA (cT1b, cN0(f), cM0, G1, ER+, PR+, HER2-) - Signed by Melanee Annah BROCKS, MD on 12/23/2024 Stage prefix: Initial diagnosis Method of lymph node assessment: Core biopsy Histologic grading system: 3 grade system   01/03/2025 Genetic Testing   Negative genetic testing. No pathogenic variants identified on the [facility] CancerNext+RNA Panel. The report date is 01/03/2025.  The [facility] CancerNext+RNAinsight Panel includes sequencing, rearrangement analysis, and RNA analysis for the following 40 genes: APC, ATM, BAP1, BARD1, BMPR1A, BRCA1, BRCA2, BRIP1, CDH1, CDKN2A, CHEK2, FH, FLCN, MET, MLH1, MSH2, MSH6, MUTYH, NF1, NTHL1, PALB2, PMS2, PTEN, RAD51C, RAD51D, RPS20, SMAD4, STK11, TP53, TSC1, TSC2, and VHL (sequencing and deletion/duplication); AXIN2, HOXB13, MBD4, MSH3, POLD1 and POLE (sequencing only); EPCAM and GREM1 (deletion/duplication only).     FAMILY HISTORY:  We obtained a detailed, 4-generation family history.  Significant diagnoses are listed below: Family History  Problem Relation Age of Onset   Hyperlipidemia  Mother    Arthritis Mother    Diabetes Mother    Heart disease Mother    Colon polyps Mother    Pancreatic cancer Mother        dx 72s-80s   Hypercholesterolemia Sister    Colon polyps Sister    Breast cancer Sister 52   Lung cancer Maternal Aunt    Skin cancer Maternal Uncle    Colon cancer Neg Hx    Esophageal cancer Neg Hx    Rectal cancer Neg Hx    Stomach cancer Neg Hx    Sister- breast cancer dx 2, recurrence and double mastectomy, living at 73, no known genetic testing Mother - pancreatic cancer dx 79s-80s Maternal aunt - lung cancer Maternal uncle- multiple skin cancers      Ms. Gee is unaware of previous family history of genetic testing for hereditary cancer risks. There is no reported Ashkenazi Jewish ancestry. There is no known consanguinity.   GENETIC TEST RESULTS:  The [facility] CancerNext+RNA Panel found no pathogenic mutations.   The [facility] CancerNext+RNAinsight Panel includes sequencing, rearrangement analysis, and RNA analysis for the following 40 genes: APC, ATM, BAP1, BARD1, BMPR1A, BRCA1, BRCA2, BRIP1, CDH1, CDKN2A, CHEK2, FH, FLCN, MET, MLH1, MSH2, MSH6, MUTYH, NF1, NTHL1, PALB2, PMS2, PTEN, RAD51C, RAD51D, RPS20, SMAD4, STK11, TP53, TSC1, TSC2, and VHL (sequencing and deletion/duplication); AXIN2, HOXB13, MBD4, MSH3, POLD1 and POLE (sequencing only); EPCAM and GREM1 (deletion/duplication only).   The test report has been scanned into EPIC and is located under the Molecular Pathology section of the Results Review tab.  A portion of the result report is included below for reference. Genetic testing reported out on 01/03/2025.      Even though a pathogenic variant was not identified, possible explanations for the  cancer in the family may include: There may be no hereditary risk for cancer in the family. The cancers in Ms. Falk and/or her family may be sporadic/familial or due to other genetic and environmental factors. There may be a gene mutation in one of  these genes that current testing methods cannot detect but that chance is small. There could be another gene that has not yet been discovered, or that we have not yet tested, that is responsible for the cancer diagnoses in the family.  It is also possible there is a hereditary cause for the cancer in the family that Ms. Mctague did not inherit.  Therefore, it is important to remain in touch with cancer genetics in the future so that we can continue to offer Ms. Boutin the most up to date genetic testing.     ADDITIONAL GENETIC TESTING:  We discussed with Ms. Vanbrocklin that her genetic testing was fairly extensive.  If there are additional relevant genes identified to increase cancer risk that can be analyzed in the future, we would be happy to discuss and coordinate this testing at that time.    CANCER SCREENING RECOMMENDATIONS:  Ms. Dubow test result is considered negative (normal).  This means that we have not identified a hereditary cause for her personal and family history of cancer at this time.   An individual's cancer risk and medical management are not determined by genetic test results alone. Overall cancer risk assessment incorporates additional factors, including personal medical history, family history, and any available genetic information that may result in a personalized plan for cancer prevention and surveillance. Therefore, it is recommended she continue to follow the cancer management and screening guidelines provided by her oncology and primary healthcare provider.  RECOMMENDATIONS FOR FAMILY MEMBERS:   Since she did not inherit a identifiable mutation in a cancer predisposition gene included on this panel, her children could not have inherited a known mutation from her in one of these genes. Individuals in this family might be at some increased risk of developing cancer, over the general population risk, due to the family history of cancer.  Individuals in the family should notify  their providers of the family history of cancer. We recommend women in this family have a yearly mammogram beginning at age 96, or 41 years younger than the earliest onset of cancer, an annual clinical breast exam, and perform monthly breast self-exams.  Family members should have colonoscopies by at age 30, or earlier, as recommended by their providers. Other members of the family may still carry a pathogenic variant in one of these genes that Ms. Glaude did not inherit. Based on the family history, we recommend her maternal relatives have genetic counseling and testing. Ms. Elpers will let us  know if we can be of any assistance in coordinating genetic counseling and/or testing for this family member.     FOLLOW-UP:  Lastly, we discussed with Ms. Gallentine that cancer genetics is a rapidly advancing field and it is possible that new genetic tests will be appropriate for her and/or her family members in the future. We encouraged her to remain in contact with cancer genetics on an annual basis so we can update her personal and family histories and let her know of advances in cancer genetics that may benefit this family.   Our contact number was provided. Ms. Childers questions were answered to her satisfaction, and she knows she is welcome to call us  at anytime with additional questions or concerns.  Dena Cary, MS, [facility] Genetic Counselor [email or web address] Phone: [phone]

## 2025-01-03 NOTE — Progress Notes (Signed)
 Lumpectomy is scheduled for 2/9.   Amanda [name] will see Dr. Melanee and Dr. Lenn on 3/4.  Appt. Details given to her daughter Amanda [name].

## 2025-01-03 NOTE — Telephone Encounter (Signed)
 I contacted Ms. Levert to discuss her genetic testing results. No pathogenic variants were identified in the 40 genes analyzed. Detailed clinic note to follow.   The test report has been scanned into EPIC and is located under the Molecular Pathology section of the Results Review tab.  A portion of the result report is included below for reference.      Dena Cary, MS, [facility] Genetic Counselor [email or web address] Phone: [phone]

## 2025-01-03 NOTE — Telephone Encounter (Signed)
"  ° °  Pre-operative Risk Assessment    Patient Name: Amanda [name]  DOB: 1951-09-23 MRN: [id number]   Date of last office visit: 10/13/2024 Date of next office visit: n/a   Request for Surgical Clearance    Procedure:  Left breast lumpectomy  Date of Surgery:  Clearance 01/15/25                                Surgeon:  Dr. Jayson Endow Surgeon's Group or Practice Name:  [facility] Phone number:  [phone] Fax number:  [phone]   Type of Clearance Requested:   - Pharmacy:  Hold Aspirin      Type of Anesthesia:  General    Additional requests/questions:    [name]   01/03/2025, 9:52 AM   "

## 2025-01-03 NOTE — Telephone Encounter (Signed)
Faxed cardiac clearance to Dr. Muhammad Arida at [phone]. 

## 2025-01-03 NOTE — Telephone Encounter (Signed)
" ° °  Name: Amanda [name]  DOB: 14-Nov-1951  MRN: [id number]  Primary Cardiologist: Deatrice Cage, MD   Preoperative team, please contact this patient and set up a phone call appointment for further preoperative risk assessment. Please obtain consent and complete medication review. Thank you for your help.  I confirm that guidance regarding antiplatelet and oral anticoagulation therapy has been completed and, if necessary, noted below. - Would continue Aspirin  if possible given TAVR.   I also confirmed the patient resides in the state of [place] . As per [facility] Medical Board telemedicine laws, the patient must reside in the state in which the provider is licensed.   Kaylyne Axton E Toini Failla, PA-C 01/03/2025, 10:47 AM [facility]    "

## 2025-01-03 NOTE — Telephone Encounter (Signed)
 Tried to call the pt to schedule a tele preop appt though vm is full, could not leave message to call back.

## 2025-01-04 ENCOUNTER — Telehealth: Payer: Self-pay

## 2025-01-04 DIAGNOSIS — C50212 Malignant neoplasm of upper-inner quadrant of left female breast: Secondary | ICD-10-CM

## 2025-01-04 NOTE — Telephone Encounter (Signed)
 Voicemail received from daughter Lela today 01/04/25 at 1:29pm saying that they spoke w/ [facility] and they would like to be set up to see a child psychotherapist.  Best call back number is [phone].

## 2025-01-04 NOTE — Telephone Encounter (Signed)
 Pt scheduled for VV on 1/30

## 2025-01-04 NOTE — Telephone Encounter (Signed)
"  °  Patient Consent for Virtual Visit        Amanda [name] has provided verbal consent on 01/04/2025 for a virtual visit (video or telephone).   CONSENT FOR VIRTUAL VISIT FOR:  Amanda [name]  By participating in this virtual visit I agree to the following:  I hereby voluntarily request, consent and authorize [facility] and its employed or contracted physicians, physician assistants, nurse practitioners or other licensed health care professionals (the Practitioner), to provide me with telemedicine health care services (the Services) as deemed necessary by the treating Practitioner. I acknowledge and consent to receive the Services by the Practitioner via telemedicine. I understand that the telemedicine visit will involve communicating with the Practitioner through live audiovisual communication technology and the disclosure of certain medical information by electronic transmission. I acknowledge that I have been given the opportunity to request an in-person assessment or other available alternative prior to the telemedicine visit and am voluntarily participating in the telemedicine visit.  I understand that I have the right to withhold or withdraw my consent to the use of telemedicine in the course of my care at any time, without affecting my right to future care or treatment, and that the Practitioner or I may terminate the telemedicine visit at any time. I understand that I have the right to inspect all information obtained and/or recorded in the course of the telemedicine visit and may receive copies of available information for a reasonable fee.  I understand that some of the potential risks of receiving the Services via telemedicine include:  Delay or interruption in medical evaluation due to technological equipment failure or disruption; Information transmitted may not be sufficient (e.g. poor resolution of images) to allow for appropriate medical decision making by the  Practitioner; and/or  In rare instances, security protocols could fail, causing a breach of personal health information.  Furthermore, I acknowledge that it is my responsibility to provide information about my medical history, conditions and care that is complete and accurate to the best of my ability. I acknowledge that Practitioner's advice, recommendations, and/or decision may be based on factors not within their control, such as incomplete or inaccurate data provided by me or distortions of diagnostic images or specimens that may result from electronic transmissions. I understand that the practice of medicine is not an exact science and that Practitioner makes no warranties or guarantees regarding treatment outcomes. I acknowledge that a copy of this consent can be made available to me via my patient portal [facility] MyChart), or I can request a printed copy by calling the office of [facility].    I understand that my insurance will be billed for this visit.   I have read or had this consent read to me. I understand the contents of this consent, which adequately explains the benefits and risks of the Services being provided via telemedicine.  I have been provided ample opportunity to ask questions regarding this consent and the Services and have had my questions answered to my satisfaction. I give my informed consent for the services to be provided through the use of telemedicine in my medical care    "

## 2025-01-05 ENCOUNTER — Ambulatory Visit: Attending: Internal Medicine

## 2025-01-05 ENCOUNTER — Inpatient Hospital Stay

## 2025-01-05 ENCOUNTER — Telehealth: Payer: Self-pay

## 2025-01-05 DIAGNOSIS — Z01810 Encounter for preprocedural cardiovascular examination: Secondary | ICD-10-CM | POA: Diagnosis present

## 2025-01-05 NOTE — Progress Notes (Signed)
 "   Virtual Visit via Telephone Note   Because of DEAN WONDER co-morbid illnesses, she is at least at moderate risk for complications without adequate follow up.  This format is felt to be most appropriate for this patient at this time.  Due to technical limitations with video connection (technology), today's appointment will be conducted as an audio only telehealth visit, and Amanda [name] verbally agreed to proceed in this manner.   All issues noted in this document were discussed and addressed.  No physical exam could be performed with this format.  Evaluation Performed:  Preoperative cardiovascular risk assessment _____________   Date:  01/05/2025   Patient ID:  Amanda [name], DOB 1951-05-08, MRN [id number] Patient Location:  Home Provider location:   Office  Primary Care Provider:  Watt Mirza, MD Primary Cardiologist:  Deatrice Cage, MD  Chief Complaint / Patient Profile   74 y.o. y/o female with a h/o  RBBB, DM-diet controlled, HTN, HLD, morbid obesity (BMI 35), CKD stage II, CAD, COPD with previous tobacco abuse, mild cognitive impairment, recently diagnosis of  and severe aortic stenosis s/p TAVR.    [name] 10/04/23 showed mild nonobstructive coronary artery disease. S/p TAVR with a 34 mm Medtronic Evolut FX THV via the TF approach on 12/21/23. Post operative echo showed EF 70%, normally functioning TAVR with a mean gradient of 8 mmHg and no PVL.  Most recent echo 12/21/2024: LVEF 60-65%, no RWMA, mild concentric LVH, G1 DD, well-seated valve bioprosthetic valve, mean gradient 9 mmHg.  She is pending left breast lumpectomy on 01/15/2025 and presents today for telephonic preoperative cardiovascular risk assessment.  History of Present Illness    Amanda [name] is a 74 y.o. female who presents via audio/video conferencing for a telehealth visit today.  Pt was last seen in cardiology clinic on 12/21/2024 by Lamarr Hummer, PA.  At that time Amanda [name] was doing  well.  The patient is now pending procedure as outlined above. Since her last visit, She denies chest pain, palpitations, dyspnea, orthopnea, n, v, dark/tarry/bloody stools, hematuria, syncope, weight gain.  She does occasionally get orthostatic dizziness that resolves within a few seconds of positional change.  She will occasionally take Lasix , typically on a weekly basis, when she gets some mild swelling in her abdomen which resolves following the diuretic.  She attends exercise classes III days a week, and physical therapy the other two, without any cardiovascular symptoms during those exercises.   Past Medical History    Past Medical History:  Diagnosis Date   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 sting    Aortic stenosis    a. 2013: nl LV sys fxn, mild MR, no evidence of pulm htn; b. TTE 8/17: EF 60-65%, no RWMA, nl LV dia fxn,  mild AS, mod AI   CAD (coronary artery disease), native coronary artery 11/26/2020   Controlled type 2 diabetes mellitus with diabetic autonomic neuropathy, without long-term current use of insulin  (HCC) 08/10/2007   COPD (chronic obstructive pulmonary disease) (HCC) 10/30/2016   Depression    GAD (generalized anxiety disorder)    Genetic testing 01/03/2025   Negative genetic testing. No pathogenic variants identified on the [facility] Cancer[employer]+RNA Panel. The report date is 01/03/2025.  The [facility] Cancer[employer]+RNAinsight Panel includes sequencing, rearrangement analysis, and RNA analysis for the following 40 genes: APC, ATM, BAP1, BARD1, BMPR1A, BRCA1, BRCA2, BRIP1, CDH1, CDKN2A, CHEK2, FH, FLCN, MET, MLH1, MSH2, MSH6, MUTYH, NF1, NTHL1, PALB2, PMS2, PTEN,*   GERD (gastroesophageal reflux disease)  Hiatal hernia with gastroesophageal reflux 1997   Hyperlipidemia    Hypertension    Osteoporosis    S/P TAVR (transcatheter aortic valve replacement) 12/21/2023   s/p TAVR with a 34 mm Evolut FX via the TF approach by Dr. Wendel & Dr. Maryjane   Tobacco abuse    Past Surgical History:   Procedure Laterality Date   ABDOMINAL HYSTERECTOMY  1993   ANTERIOR CERVICAL DECOMP/DISCECTOMY FUSION N/A 10/26/2018   Procedure: ACDF C4-C5 C5-C6 C6-C7;  Surgeon: Onetha Kuba, MD;  Location: [facility] OR;  Service: Neurosurgery;  Laterality: N/A;   APPENDECTOMY     BACK SURGERY     BREAST BIOPSY Right 2007   benign   BREAST BIOPSY Left 12/06/2024   US  LT BREAST BX W LOC DEV 1ST LESION IMG BX SPEC US  GUIDE 12/06/2024 [facility]   BREAST BIOPSY Left 12/13/2024   MM LT BREAST BX W LOC DEV 1ST LESION IMAGE BX SPEC STEREO GUIDE 12/13/2024 [facility]   CHOLECYSTECTOMY     DILATION AND CURETTAGE OF UTERUS     ESOPHAGEAL DILATION     x 3   ESOPHAGOGASTRODUODENOSCOPY (EGD) WITH PROPOFOL  N/A 02/06/2022   Procedure: ESOPHAGOGASTRODUODENOSCOPY (EGD) WITH PROPOFOL ;  Surgeon: Therisa Bi, MD;  Location: [facility] ENDOSCOPY;  Service: Gastroenterology;  Laterality: N/A;   ESOPHAGOGASTRODUODENOSCOPY (EGD) WITH PROPOFOL  N/A 02/26/2022   Procedure: ESOPHAGOGASTRODUODENOSCOPY (EGD) WITH PROPOFOL ;  Surgeon: Therisa Bi, MD;  Location: [facility] ENDOSCOPY;  Service: Gastroenterology;  Laterality: N/A;  EGD+dilation per Dr. Therisa   INCISION AND DRAINAGE Right 12/02/2021   Procedure: INCISION AND DRAINAGE;  Surgeon: Kathlynn Sharper, MD;  Location: [facility] ORS;  Service: Orthopedics;  Laterality: Right;   INTRAOPERATIVE TRANSTHORACIC ECHOCARDIOGRAM N/A 12/21/2023   Procedure: INTRAOPERATIVE TRANSTHORACIC ECHOCARDIOGRAM;  Surgeon: Wendel Lurena POUR, MD;  Location: [facility] INVASIVE CV LAB;  Service: Cardiovascular;  Laterality: N/A;   LEFT HEART CATH AND CORONARY ANGIOGRAPHY N/A 11/11/2020   Procedure: LEFT HEART CATH AND CORONARY ANGIOGRAPHY;  Surgeon: Darron Deatrice LABOR, MD;  Location: [facility] INVASIVE CV LAB;  Service: Cardiovascular;  Laterality: N/A;   OOPHORECTOMY     OVARIAN CYST REMOVAL  1972   RIGHT/LEFT HEART CATH AND CORONARY ANGIOGRAPHY Bilateral 10/04/2023   Procedure: RIGHT/LEFT HEART CATH AND CORONARY ANGIOGRAPHY;   Surgeon: Darron Deatrice LABOR, MD;  Location: [facility] INVASIVE CV LAB;  Service: Cardiovascular;  Laterality: Bilateral;   SALIVARY GLAND SURGERY     TONSILLECTOMY     4 yoa   TOTAL KNEE ARTHROPLASTY Left 12/24/2015   Procedure: LEFT TOTAL KNEE ARTHROPLASTY;  Surgeon: Glendia Cordella Hutchinson, MD;  Location: [facility] OR;  Service: Orthopedics;  Laterality: Left;   TOTAL KNEE ARTHROPLASTY Right 12/30/2017   Procedure: RIGHT TOTAL KNEE ARTHROPLASTY;  Surgeon: Hutchinson Cordella Glendia, MD;  Location: [facility] OR;  Service: Orthopedics;  Laterality: Right;   WRIST FRACTURE SURGERY  11/2008    Allergies  Allergies[1]  Home Medications    Prior to Admission medications  Medication Sig Start Date End Date Taking? Authorizing Provider  acetaminophen  (TYLENOL ) 500 MG tablet Take 1,000 mg by mouth every 8 (eight) hours as needed for mild pain (pain score 1-3) or moderate pain (pain score 4-6).    [provider]  albuterol  (VENTOLIN  HFA) 108 (90 Base) MCG/ACT inhaler Inhale 2 puffs into the lungs every 4 (four) hours as needed for wheezing or shortness of breath. 08/12/22   Copland, Jacques, MD  amitriptyline  (ELAVIL ) 50 MG tablet Take 1 tablet (50 mg total) by mouth at bedtime. 09/11/24   Watt Jacques, MD  Ascorbic Acid  (VITAMIN C ) 1000 MG tablet Take 1,000 mg by mouth daily.    [provider]  aspirin  EC 81 MG tablet Take 81 mg by mouth daily.    [provider]  budesonide  (PULMICORT ) 0.5 MG/2ML nebulizer solution Take 2 mLs (0.5 mg total) by nebulization 2 (two) times daily. 04/05/24   Copland, Jacques, MD  buPROPion  (WELLBUTRIN  XL) 150 MG 24 hr tablet Take 1 tablet by mouth once daily 11/24/24   Copland, Jacques, MD  calcium  carbonate (OS-CAL - DOSED IN MG OF ELEMENTAL CALCIUM ) 1250 (500 Ca) MG tablet Take 1 tablet by mouth daily.    [provider]  calcium  carbonate (TUMS - DOSED IN MG ELEMENTAL CALCIUM ) 500 MG chewable tablet Chew 2 tablets by mouth daily as needed for indigestion or  heartburn.    [provider]  Cholecalciferol  (VITAMIN D ) 50 MCG (2000 UT) tablet Take 2,000 Units by mouth daily.    [provider]  clonazePAM  (KLONOPIN ) 0.5 MG tablet Take 1 tablet by mouth twice daily as needed 11/24/24   Copland, Jacques, MD  cyclobenzaprine  (FLEXERIL ) 10 MG tablet Take 1 tablet by mouth three times daily as needed for muscle spasm 08/27/23   Copland, Jacques, MD  dextromethorphan  (DELSYM ) 30 MG/5ML liquid Take 15 mg by mouth as needed for cough.    [provider]  dimenhyDRINATE (DRAMAMINE) 50 MG tablet Take 50 mg by mouth every 8 (eight) hours as needed for nausea or dizziness.    [provider]  donepezil  (ARICEPT ) 5 MG tablet TAKE 1 TABLET BY MOUTH AT BEDTIME 05/02/24   Copland, Jacques, MD  ezetimibe  (ZETIA ) 10 MG tablet Take 1 tablet by mouth once daily 03/23/24   Dunn, Bernardino HERO, PA-C  fluticasone  (FLONASE) 50 MCG/ACT nasal spray Place 2 sprays into both nostrils daily as needed for rhinitis or allergies. 01/15/23   [provider]  furosemide  (LASIX ) 20 MG tablet Take 1 tablet (20 mg total) by mouth daily as needed (for increased shortness of breath or weight gain greater than 3 pounds overnight.). 07/11/24 01/04/25  Abigail Bernardino HERO, PA-C  gabapentin  (NEURONTIN ) 100 MG capsule Take 100 mg by mouth 2 (two) times daily. 04/18/24   [provider]  guaiFENesin  (MUCINEX ) 600 MG 12 hr tablet Take 600 mg by mouth daily.    [provider]  ipratropium-albuterol  (DUONEB) 0.5-2.5 (3) MG/3ML SOLN Take 3 mLs by nebulization every 6 (six) hours as needed. 04/05/24   Copland, Jacques, MD  magnesium  oxide (MAG-OX) 400 (240 Mg) MG tablet Take 400 mg by mouth daily.    [provider]  metoprolol  tartrate (LOPRESSOR ) 25 MG tablet Take 1/2 (one-half) tablet by mouth twice daily 05/29/24   Arida, Muhammad A, MD  Multiple Vitamin (MULTIVITAMIN) tablet Take 1 tablet by mouth daily.    [provider]  naproxen  sodium  (ALEVE ) 220 MG tablet Take 220 mg by mouth in the morning.    [provider]  OXYGEN  Inhale 2-3 L into the lungs at bedtime.    [provider]  pantoprazole  (PROTONIX ) 40 MG tablet TAKE 1 TABLET BY MOUTH ONCE DAILY (TAKE  30  MINUTES  PRIOR  TO  BREAKFAST) 11/24/24   Copland, Jacques, MD  predniSONE  (DELTASONE ) 20 MG tablet 2 tabs po for 4 days, then 1 tab po for 4 days 08/21/24   Copland, Jacques, MD  rosuvastatin  (CRESTOR ) 40 MG tablet Take 1 tablet by mouth once daily 05/02/24   Watt Jacques, MD  Simethicone  125 MG CAPS Take 125 mg by mouth daily as needed (gas).    [provider]  spironolactone  (ALDACTONE ) 25 MG tablet Take 0.5 tablets (12.5 mg total) by mouth daily. 07/11/24 01/04/25  Abigail Bernardino HERO, PA-C  traZODone  (DESYREL ) 50 MG tablet Take 50 mg by mouth at bedtime as needed for sleep.    [provider]  vitamin E 180 MG (400 UNITS) capsule Take 400 Units by mouth daily.    [provider]    Physical Exam    Vital Signs:  Amanda [name] does not have vital signs available for review today.  Given telephonic nature of communication, physical exam is limited. AAOx3. NAD. Normal affect.  Speech and respirations are unlabored.  Accessory Clinical Findings    None  Assessment & Plan    1.  Preoperative Cardiovascular Risk Assessment: According to the Revised Cardiac Risk Index (RCRI), her Perioperative Risk of Major Cardiac Event is (%): 0.4  Her Functional Capacity in METs is: 4.4 according to the Duke Activity Status Index (DASI). Therefore, based on ACC/AHA guidelines, patient would be at acceptable risk for the planned procedure without further cardiovascular testing.  The patient was advised that if she develops new symptoms prior to surgery to contact our office to arrange for a follow-up visit, and she verbalized understanding.  Would continue Aspirin  if possible given TAVR.   A copy of this note will be routed to  requesting surgeon.  Time:   Today, I have spent 10 minutes with the patient with telehealth technology discussing medical history, symptoms, and management plan.     Saanya Zieske E Janera Peugh, NP  01/05/2025, 12:20 PM     [1]  Allergies Allergen Reactions   Honey Bee Venom [Bee Venom] Anaphylaxis   Diazepam Other (See Comments)    REACTION: pt states she gets very angry and abusive verbally on this medication    Lisinopril  Cough   "

## 2025-01-05 NOTE — Telephone Encounter (Signed)
 Received pulmonary clearance from Dr. Isaiah. Pt's risk assessment is low and is optimized for surgery.

## 2025-01-05 NOTE — Telephone Encounter (Signed)
 Outbound call to daughter Lela who indicated they are in need of community resources.  Daughter is currently out of work, money is tight and that it makes it difficult to look for another job with driving her mother back and forth to doctor's appointments.  Referral put in to social work.

## 2025-01-05 NOTE — Progress Notes (Signed)
 [facility] Clinical Social Work  Clinical Social Work was referred by engineer, civil (consulting) for financial concerns.  Clinical Social Worker contacted caregiver by phone (daughter) to offer support and assess for needs.  Daughter reported financial challenges due to patient's limited income. Clinical Child Psychotherapist assessed for potential financial assistance organizations. CSW will complete caring community application on 2/2 on patient's behalf. Patient's will send statement to submit for application.     Follow Up Plan:  CSW continuing to follow patient.    Lizbeth Sprague, LCSW  Clinical Social Worker [facility]

## 2025-01-08 ENCOUNTER — Inpatient Hospital Stay
Admission: RE | Admit: 2025-01-08 | Discharge: 2025-01-08 | Disposition: A | Source: Ambulatory Visit | Attending: General Surgery

## 2025-01-08 ENCOUNTER — Ambulatory Visit: Payer: Self-pay | Admitting: General Surgery

## 2025-01-08 ENCOUNTER — Other Ambulatory Visit: Payer: Self-pay

## 2025-01-08 VITALS — Ht 68.0 in | Wt 224.9 lb

## 2025-01-08 DIAGNOSIS — C50912 Malignant neoplasm of unspecified site of left female breast: Secondary | ICD-10-CM

## 2025-01-08 DIAGNOSIS — C50212 Malignant neoplasm of upper-inner quadrant of left female breast: Secondary | ICD-10-CM

## 2025-01-08 DIAGNOSIS — Z01812 Encounter for preprocedural laboratory examination: Secondary | ICD-10-CM

## 2025-01-08 DIAGNOSIS — E1143 Type 2 diabetes mellitus with diabetic autonomic (poly)neuropathy: Secondary | ICD-10-CM

## 2025-01-08 DIAGNOSIS — J4489 Other specified chronic obstructive pulmonary disease: Secondary | ICD-10-CM

## 2025-01-08 DIAGNOSIS — I2510 Atherosclerotic heart disease of native coronary artery without angina pectoris: Secondary | ICD-10-CM

## 2025-01-08 HISTORY — DX: Sleep apnea, unspecified: G47.30

## 2025-01-08 MED ORDER — LIDOCAINE HCL (PF) 1 % IJ SOLN
5.0000 mL | Freq: Once | INTRAMUSCULAR | Status: AC
  Administered 2025-01-08: 5 mL
  Filled 2025-01-08: qty 5

## 2025-01-09 ENCOUNTER — Encounter: Payer: Self-pay | Admitting: Family Medicine

## 2025-01-09 ENCOUNTER — Telehealth: Payer: Self-pay | Admitting: *Deleted

## 2025-01-09 ENCOUNTER — Telehealth: Payer: Self-pay

## 2025-01-09 ENCOUNTER — Encounter: Payer: Self-pay | Admitting: Urgent Care

## 2025-01-10 ENCOUNTER — Inpatient Hospital Stay: Admitting: Occupational Therapy

## 2025-01-11 ENCOUNTER — Inpatient Hospital Stay: Admission: RE | Admit: 2025-01-11 | Discharge: 2025-01-11 | Attending: General Surgery

## 2025-01-11 DIAGNOSIS — Z01812 Encounter for preprocedural laboratory examination: Secondary | ICD-10-CM

## 2025-01-11 DIAGNOSIS — I2510 Atherosclerotic heart disease of native coronary artery without angina pectoris: Secondary | ICD-10-CM

## 2025-01-11 DIAGNOSIS — J4489 Other specified chronic obstructive pulmonary disease: Secondary | ICD-10-CM

## 2025-01-11 LAB — BASIC METABOLIC PANEL WITH GFR
Anion gap: 8 (ref 5–15)
BUN: 11 mg/dL (ref 8–23)
CO2: 25 mmol/L (ref 22–32)
Calcium: 9 mg/dL (ref 8.9–10.3)
Chloride: 106 mmol/L (ref 98–111)
Creatinine, Ser: 0.78 mg/dL (ref 0.44–1.00)
GFR, Estimated: >60 mL/min
Glucose, Bld: 94 mg/dL (ref 70–99)
Potassium: 4.4 mmol/L (ref 3.5–5.1)
Sodium: 139 mmol/L (ref 135–145)

## 2025-01-11 LAB — CBC
HCT: 34.1 % — ABNORMAL LOW (ref 36.0–46.0)
Hemoglobin: 11.8 g/dL — ABNORMAL LOW (ref 12.0–15.0)
MCH: 34.7 pg — ABNORMAL HIGH (ref 26.0–34.0)
MCHC: 34.6 g/dL (ref 30.0–36.0)
MCV: 100.3 fL — ABNORMAL HIGH (ref 80.0–100.0)
Platelets: 161 10*3/uL (ref 150–400)
RBC: 3.4 MIL/uL — ABNORMAL LOW (ref 3.87–5.11)
RDW: 11.4 % — ABNORMAL LOW (ref 11.5–15.5)
WBC: 4.7 10*3/uL (ref 4.0–10.5)
nRBC: 0 % (ref 0.0–0.2)

## 2025-01-12 ENCOUNTER — Inpatient Hospital Stay: Attending: Oncology

## 2025-01-12 NOTE — Progress Notes (Addendum)
 [facility] CSW Progress Note  Clinical Engineer, [employer] for [employer] Patient's daughter notified.    Lizbeth Sprague, LCSW Clinical Social Worker [facility]   Addendum -   [facility] is out of funds for the month of February. CSW will have to submit for the March Application Period. Patient's daughter notified.

## 2025-01-15 ENCOUNTER — Encounter: Payer: Self-pay | Admitting: Urgent Care

## 2025-01-15 ENCOUNTER — Ambulatory Visit: Admission: RE | Admit: 2025-01-15 | Admitting: General Surgery

## 2025-01-15 ENCOUNTER — Encounter

## 2025-01-15 ENCOUNTER — Encounter: Admission: RE | Payer: Self-pay

## 2025-01-15 HISTORY — DX: Chronic kidney disease, stage 2 (mild): N18.2

## 2025-01-15 HISTORY — DX: Long term (current) use of aspirin: Z79.82

## 2025-01-15 HISTORY — DX: Type 2 diabetes mellitus without complications: E11.9

## 2025-01-15 HISTORY — DX: Mild cognitive impairment of uncertain or unknown etiology: G31.84

## 2025-01-15 HISTORY — DX: Unspecified right bundle-branch block: I45.10

## 2025-01-15 HISTORY — DX: Atherosclerosis of aorta: I70.0

## 2025-01-15 HISTORY — DX: Insomnia, unspecified: G47.00

## 2025-01-15 HISTORY — DX: Dependence on supplemental oxygen: Z99.81

## 2025-01-15 HISTORY — DX: Other ill-defined heart diseases: I51.89

## 2025-01-17 ENCOUNTER — Ambulatory Visit: Admitting: Podiatry

## 2025-02-07 ENCOUNTER — Inpatient Hospital Stay: Admitting: Oncology

## 2025-02-07 ENCOUNTER — Ambulatory Visit: Admitting: Radiation Oncology

## 2025-02-07 ENCOUNTER — Inpatient Hospital Stay: Admitting: Occupational Therapy

## 2025-02-14 ENCOUNTER — Other Ambulatory Visit

## 2025-02-21 ENCOUNTER — Encounter: Admitting: Family Medicine

## 2025-02-21 ENCOUNTER — Ambulatory Visit: Admitting: Podiatry

## 2025-02-28 ENCOUNTER — Encounter: Admitting: Family Medicine

## 2025-02-28 ENCOUNTER — Ambulatory Visit

## 2025-03-16 ENCOUNTER — Ambulatory Visit: Admitting: Physician Assistant

## 2025-03-19 ENCOUNTER — Ambulatory Visit: Admitting: Internal Medicine
